# Patient Record
Sex: Female | Born: 1935 | Race: White | Hispanic: No | Smoking: Former smoker
Health system: Southern US, Community
[De-identification: ages and names within clinical notes are randomized; demographics above are authoritative.]

## PROBLEM LIST (undated history)

## (undated) DIAGNOSIS — I10 Essential (primary) hypertension: Secondary | ICD-10-CM

## (undated) DIAGNOSIS — G894 Chronic pain syndrome: Secondary | ICD-10-CM

## (undated) DIAGNOSIS — I639 Cerebral infarction, unspecified: Secondary | ICD-10-CM

## (undated) DIAGNOSIS — H9190 Unspecified hearing loss, unspecified ear: Secondary | ICD-10-CM

## (undated) DIAGNOSIS — M549 Dorsalgia, unspecified: Secondary | ICD-10-CM

## (undated) DIAGNOSIS — J189 Pneumonia, unspecified organism: Secondary | ICD-10-CM

## (undated) DIAGNOSIS — M6282 Rhabdomyolysis: Secondary | ICD-10-CM

## (undated) DIAGNOSIS — R296 Repeated falls: Secondary | ICD-10-CM

## (undated) DIAGNOSIS — H04123 Dry eye syndrome of bilateral lacrimal glands: Secondary | ICD-10-CM

## (undated) DIAGNOSIS — I2699 Other pulmonary embolism without acute cor pulmonale: Secondary | ICD-10-CM

## (undated) DIAGNOSIS — G8929 Other chronic pain: Secondary | ICD-10-CM

## (undated) HISTORY — DX: Dorsalgia, unspecified: M54.9

## (undated) HISTORY — PX: APPENDECTOMY: SHX54

## (undated) HISTORY — DX: Essential (primary) hypertension: I10

## (undated) HISTORY — DX: Pneumonia, unspecified organism: J18.9

## (undated) HISTORY — DX: Other chronic pain: G89.29

## (undated) HISTORY — PX: ABDOMINAL HYSTERECTOMY: SHX81

## (undated) HISTORY — PX: CATARACT EXTRACTION: SUR2

## (undated) HISTORY — PX: BREAST ENHANCEMENT SURGERY: SHX7

## (undated) HISTORY — DX: Chronic pain syndrome: G89.4

## (undated) HISTORY — PX: TONSILLECTOMY: SUR1361

## (undated) NOTE — *Deleted (*Deleted)
[place] MEDICAID [id number] LEVEL OF CARE SCREENING TOOL     IDENTIFICATION  Patient Name: Angelica Bell Birthdate: 06-Jan-1932 Sex: female Admission Date (Current Location): 10/10/2020  [facility] and [place] Number:  [employer] and Address:  [facility],  [address] [place] [address]      Provider Number: [id number]  Attending Physician Name and Address:  Vassie Loll, MD  Relative Name and Phone Number:       Current Level of Care: Hospital Recommended Level of Care: Skilled Nursing Facility Prior Approval Number:    Date Approved/Denied:   PASRR Number:    Discharge Plan: SNF    Current Diagnoses: Patient Active Problem List   Diagnosis Date Noted  . AMS (altered mental status) 10/12/2020  . Acute ischemic stroke (HCC) 10/11/2020  . Hyperglycemia 10/11/2020  . Failure to thrive in adult 10/11/2020  . Palliative care by specialist   . Goals of care, counseling/discussion   . DNR (do not resuscitate) discussion   . Acute CVA (cerebrovascular accident) (HCC) 08/31/2020  . Acute metabolic encephalopathy 08/30/2020  . Acute respiratory failure with hypoxia (HCC) 05/17/2020  . Opioid overdose (HCC) 05/13/2020  . DNR (do not resuscitate) 03/09/2020  . Frequent falls 03/09/2020  . Multiple rib fractures 03/08/2020  . Common bile duct dilatation 03/08/2020  . Leukocytosis 03/08/2020  . Normocytic anemia 03/08/2020  . Hypomagnesemia 02/08/2020  . Fall 02/06/2020  . AKI (acute kidney injury) (HCC) 02/05/2020  . UTI (urinary tract infection) 09/01/2019  . Rhabdomyolysis 09/01/2019  . Shock (HCC) 08/31/2019  . Acute cystitis 08/03/2019  . Confusion and disorientation 08/03/2019  . Cerebrovascular accident (CVA) (HCC)   . ARF (acute renal failure) (HCC) 07/26/2019  . Hyponatremia 07/26/2019  . Abnormal liver function 07/26/2019  . Peripheral neuropathy   . Drug overdose   . Polypharmacy   . Altered mental status 10/25/2017  . Closed  displaced intertrochanteric fracture of left femur (HCC) 02/16/2017  . Hip fracture, right (HCC) 12/25/2015  . Chronic pain 12/24/2015  . Fracture of femoral neck, right, closed (HCC) 12/24/2015  . Essential hypertension 12/24/2015  . Community acquired pneumonia 04/15/2013  . Hypokalemia 04/15/2013  . Labial cyst 02/08/2012  . POSTMENOPAUSAL SYNDROME 11/28/2009  . Mononeuritis of lower limb 02/22/2009  . INSOMNIA-SLEEP DISORDER-UNSPEC 10/12/2008  . Lumbago 08/02/2008  . 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 ANXIETY DEPRESSION 12/16/2007  . Hyperlipidemia 07/08/2007  . Osteoarthritis 07/08/2007  . Osteoporosis 07/08/2007  . SCOLIOSIS NEC 07/08/2007    Orientation RESPIRATION BLADDER Height & Weight     Self  Normal Incontinent Weight: 44 kg Height:  5\' 6"  (167.6 cm)  BEHAVIORAL SYMPTOMS/MOOD NEUROLOGICAL BOWEL NUTRITION STATUS      Incontinent Diet (see dc summary)  AMBULATORY STATUS COMMUNICATION OF NEEDS Skin   Extensive Assist Verbally Normal                       Personal Care Assistance Level of Assistance  Bathing, Feeding, Dressing Bathing Assistance: Maximum assistance Feeding assistance: Maximum assistance Dressing Assistance: Maximum assistance     Functional Limitations Info  Sight, Hearing, Speech Sight Info: Adequate Hearing Info: Adequate Speech Info: Impaired    SPECIAL CARE FACTORS FREQUENCY                       Contractures Contractures Info: Not present    Additional Factors Info  Code Status, Allergies Code Status Info: Full Allergies Info: Hctz, Amoxicillin-pot  Clavulanate, Lisinopril-hydrocholrothiazide, Sulfa Antibiotics, Sulfonamide Derivatives           Current Medications (10/13/2020):  This is the current hospital active medication list Current Facility-Administered Medications  Medication Dose Route Frequency Provider Last Rate Last Admin  . 0.9 %  sodium chloride infusion   Intravenous Continuous  Vassie Loll, MD 50 mL/hr at 10/13/20 1605 New Bag at 10/13/20 1605  . acetaminophen (TYLENOL) tablet 500 mg  500 mg Oral Q6H PRN Vassie Loll, MD      . amLODipine (NORVASC) tablet 2.5 mg  2.5 mg Oral Daily Vassie Loll, MD   2.5 mg at 10/13/20 1032  . aspirin EC tablet 81 mg  81 mg Oral Daily Vassie Loll, MD   81 mg at 10/13/20 1033  . atorvastatin (LIPITOR) tablet 40 mg  40 mg Oral Daily Adefeso, Oladapo, DO   40 mg at 10/13/20 1032  . buPROPion [facility]) tablet 75 mg  75 mg Oral Daily Vassie Loll, MD   75 mg at 10/13/20 1032  . cefTRIAXone (ROCEPHIN) 1 g in sodium chloride 0.9 % 100 mL IVPB  1 g Intravenous Q24H Adefeso, Oladapo, DO 200 mL/hr at 10/12/20 2206 1 g at 10/12/20 2206  . cholecalciferol (VITAMIN D3) tablet 1,000 Units  1,000 Units Oral Daily Vassie Loll, MD   1,000 Units at 10/13/20 1032  . clopidogrel (PLAVIX) tablet 75 mg  75 mg Oral Daily Vassie Loll, MD   75 mg at 10/13/20 1033  . donepezil (ARICEPT) tablet 5 mg  5 mg Oral Daily Vassie Loll, MD   5 mg at 10/12/20 2207  . feeding supplement (ENSURE ENLIVE / ENSURE PLUS) liquid 237 mL  1 Bottle Oral TID Vassie Loll, MD   237 mL at 10/13/20 1607  . mirtazapine (REMERON) tablet 7.5 mg  7.5 mg Oral QHS Vassie Loll, MD   7.5 mg at 10/12/20 2207  . multivitamin with minerals tablet 1 tablet  1 tablet Oral Daily Vassie Loll, MD   1 tablet at 10/13/20 1032     Discharge Medications: Please see discharge summary for a list of discharge medications.  Relevant Imaging Results:  Relevant Lab Results:   Additional Information    Fransisca Shawn, Chrystine Oiler, RN

---

## 1999-01-01 ENCOUNTER — Observation Stay (HOSPITAL_COMMUNITY): Admission: RE | Admit: 1999-01-01 | Discharge: 1999-01-02 | Payer: Self-pay | Admitting: Urology

## 1999-01-07 ENCOUNTER — Emergency Department (HOSPITAL_COMMUNITY): Admission: EM | Admit: 1999-01-07 | Discharge: 1999-01-07 | Payer: Self-pay | Admitting: Emergency Medicine

## 2000-01-23 ENCOUNTER — Encounter: Payer: Self-pay | Admitting: Rheumatology

## 2000-01-23 ENCOUNTER — Encounter: Admission: RE | Admit: 2000-01-23 | Discharge: 2000-01-23 | Payer: Self-pay | Admitting: Rheumatology

## 2000-02-12 ENCOUNTER — Encounter: Payer: Self-pay | Admitting: Rheumatology

## 2000-02-12 ENCOUNTER — Encounter: Admission: RE | Admit: 2000-02-12 | Discharge: 2000-02-12 | Payer: Self-pay | Admitting: Rheumatology

## 2000-02-13 ENCOUNTER — Encounter: Admission: RE | Admit: 2000-02-13 | Discharge: 2000-02-13 | Payer: Self-pay | Admitting: Rheumatology

## 2000-02-13 ENCOUNTER — Encounter: Payer: Self-pay | Admitting: Rheumatology

## 2000-05-20 ENCOUNTER — Encounter: Admission: RE | Admit: 2000-05-20 | Discharge: 2000-05-20 | Payer: Self-pay | Admitting: Internal Medicine

## 2000-05-20 ENCOUNTER — Encounter: Payer: Self-pay | Admitting: Internal Medicine

## 2003-09-12 ENCOUNTER — Emergency Department (HOSPITAL_COMMUNITY): Admission: EM | Admit: 2003-09-12 | Discharge: 2003-09-13 | Payer: Self-pay | Admitting: Emergency Medicine

## 2003-09-13 ENCOUNTER — Encounter: Payer: Self-pay | Admitting: Emergency Medicine

## 2003-12-26 ENCOUNTER — Encounter: Admission: RE | Admit: 2003-12-26 | Discharge: 2003-12-26 | Payer: Self-pay | Admitting: Family Medicine

## 2004-04-22 ENCOUNTER — Emergency Department (HOSPITAL_COMMUNITY): Admission: EM | Admit: 2004-04-22 | Discharge: 2004-04-22 | Payer: Self-pay | Admitting: Emergency Medicine

## 2004-05-11 IMAGING — CT CT HEAD W/O CM
1 series · 16 of 30 positions shown, 20 images · non-contrast
Comparison: NONE.

FINDINGS
CLINICAL DATA: NECK PAIN, HEADACHE, WEAKNESS.
CRANIAL CT - WITHOUT CONTRAST - 09/12/2003
TECHNIQUE: 5 MM AXIAL IMAGES WERE OBTAINED FROM THE SKULL BASE THROUGH THE BRAIN TO THE VERTEX.

[Series 2: trauma head · axial · 0.47mm/px · z∈[+112,+232]mm · 16 of 44 slices shown, 20 images]
[im 2/44  brain]
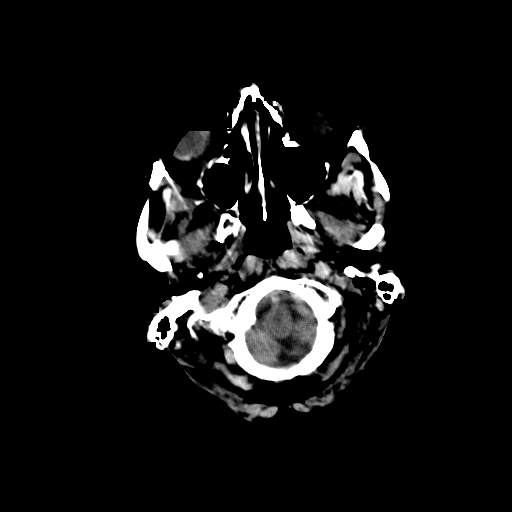
[im 2/44  bone]
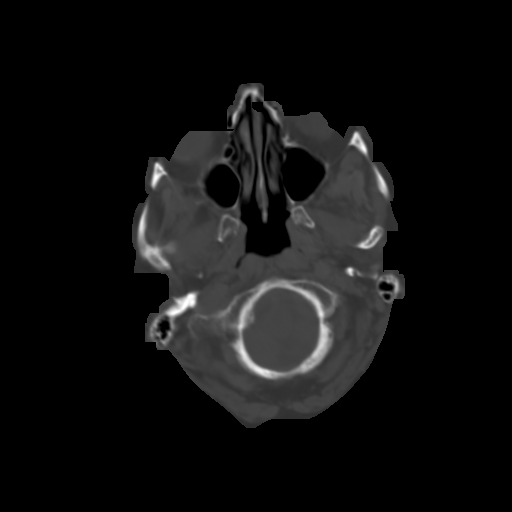
[im 5/44  brain]
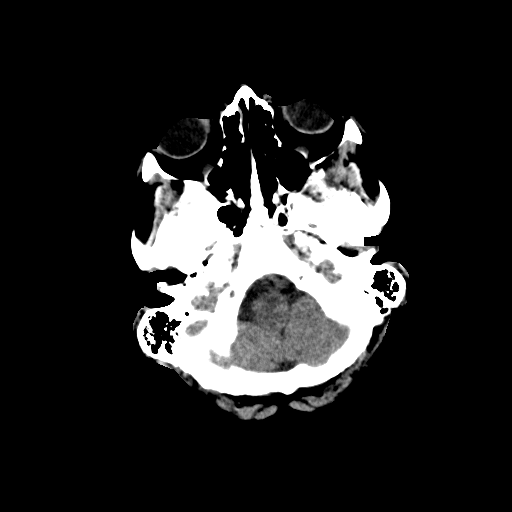
[im 8/44  brain]
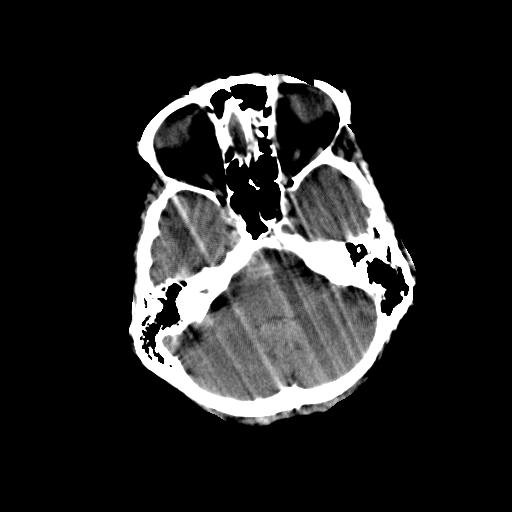
[im 11/44  brain]
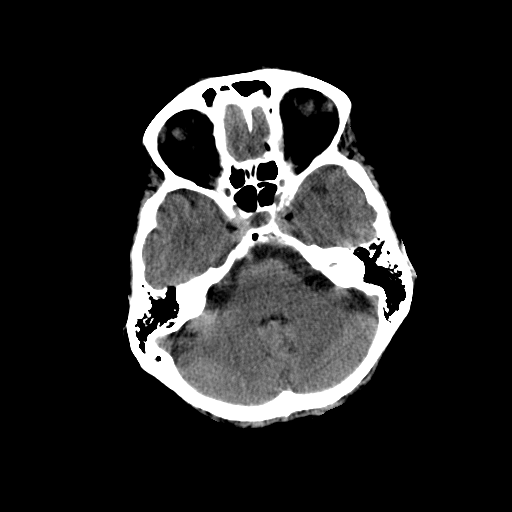
[im 12/44  brain]
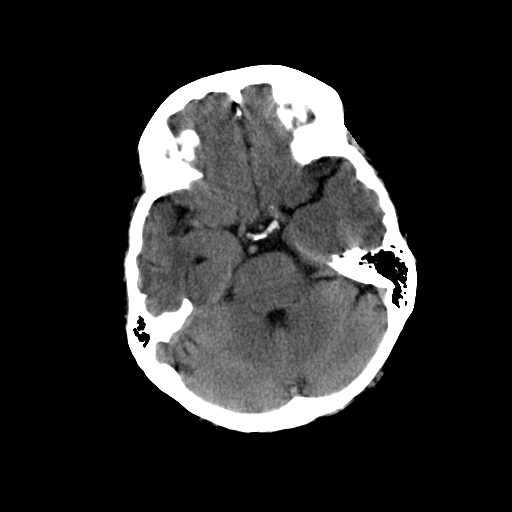
[im 12/44  bone]
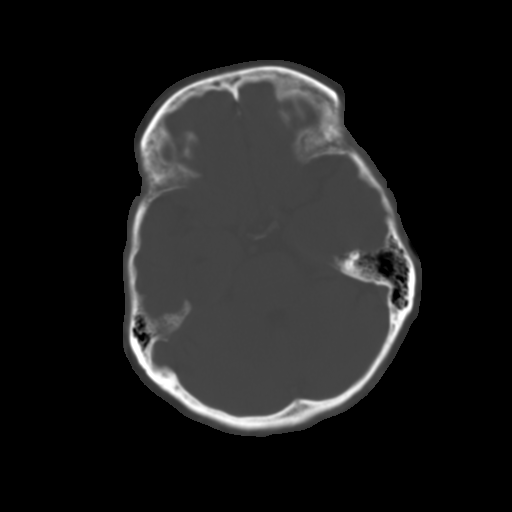
[im 15/44  brain]
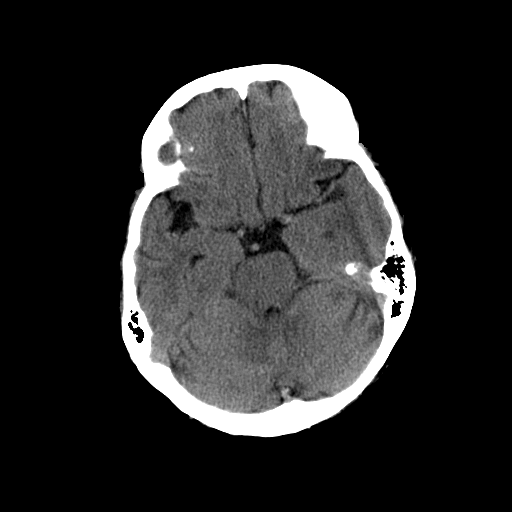
[im 18/44  brain]
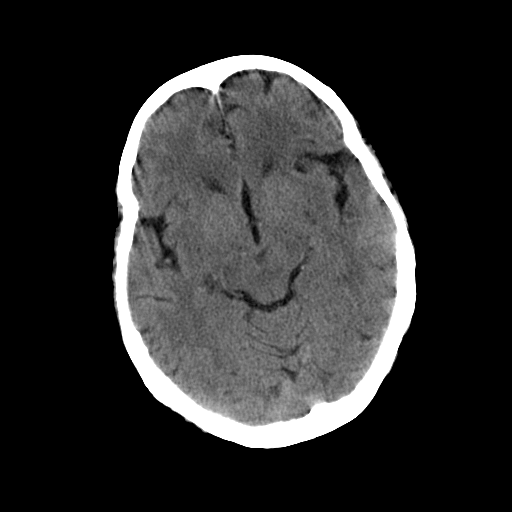
[im 21/44  brain]
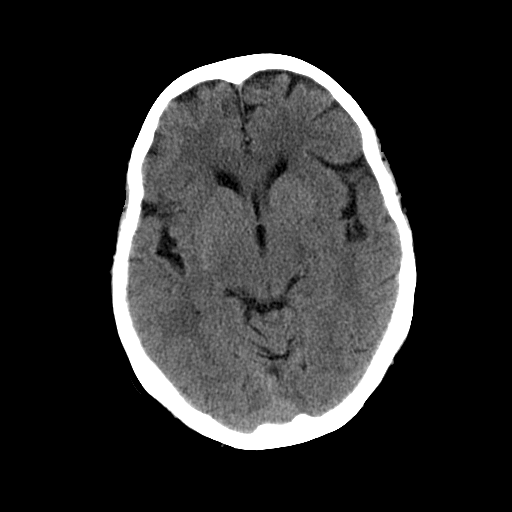
[im 23/44  brain]
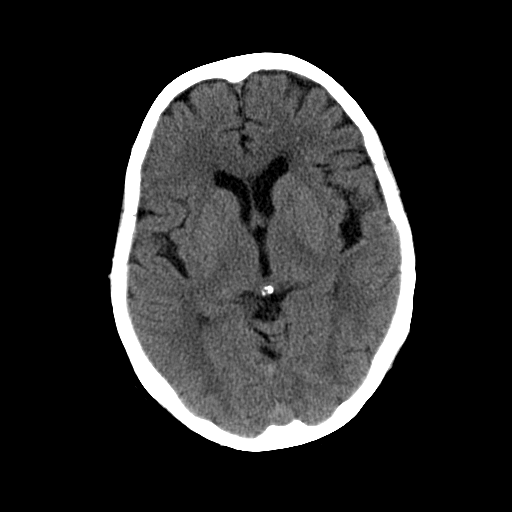
[im 23/44  bone]
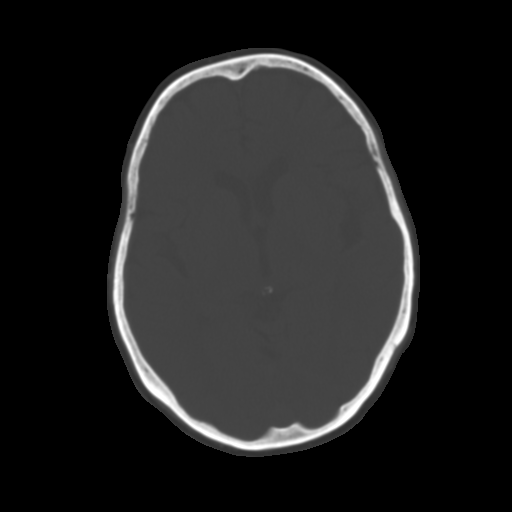
[im 26/44  brain]
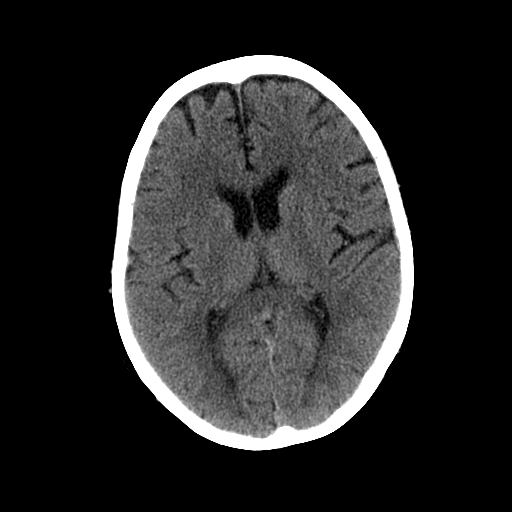
[im 29/44  brain]
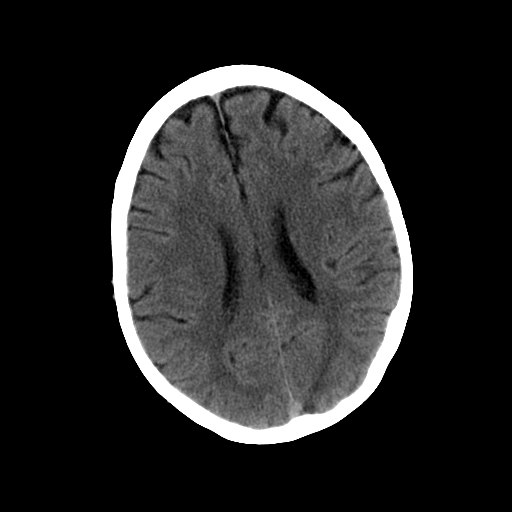
[im 32/44  brain]
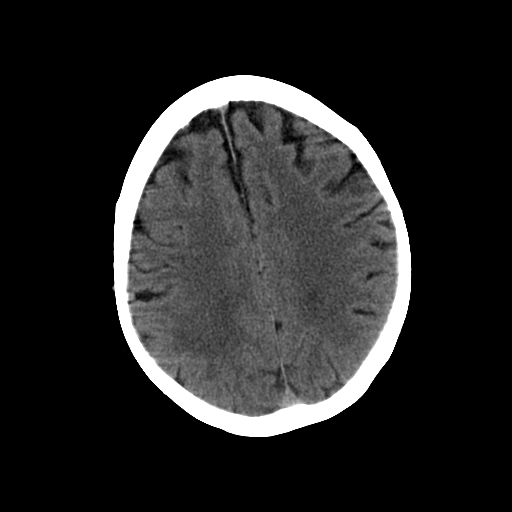
[im 33/44  brain]
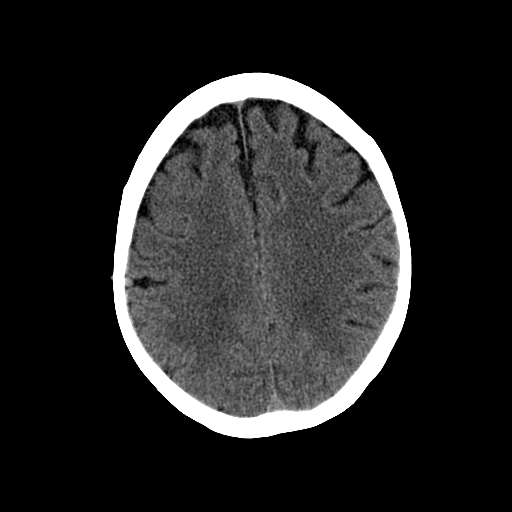
[im 33/44  bone]
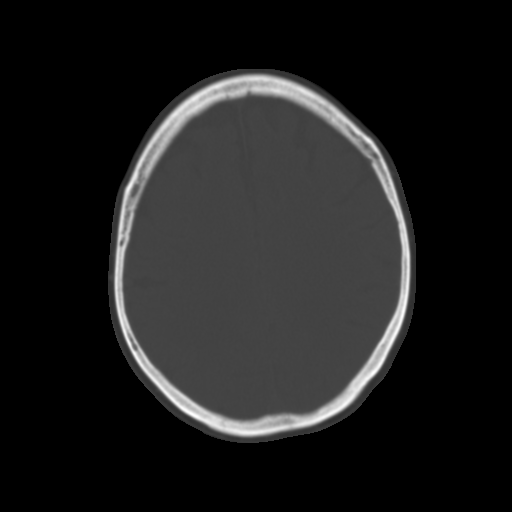
[im 36/44  brain]
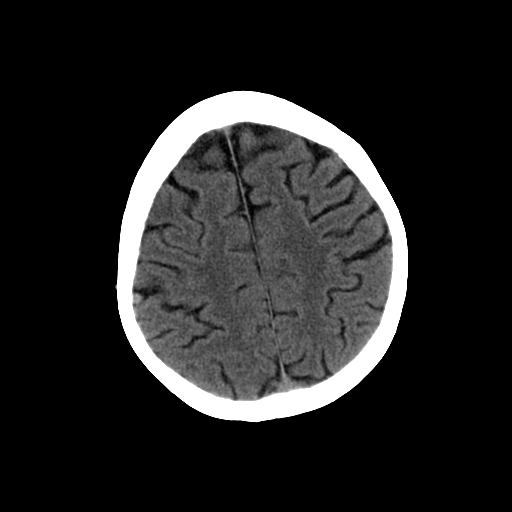
[im 39/44  brain]
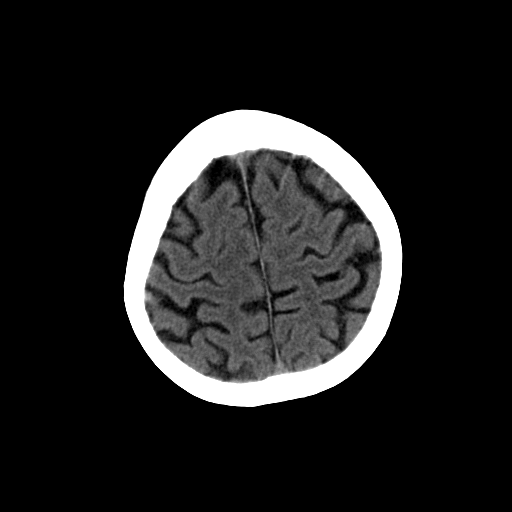
[im 42/44  brain]
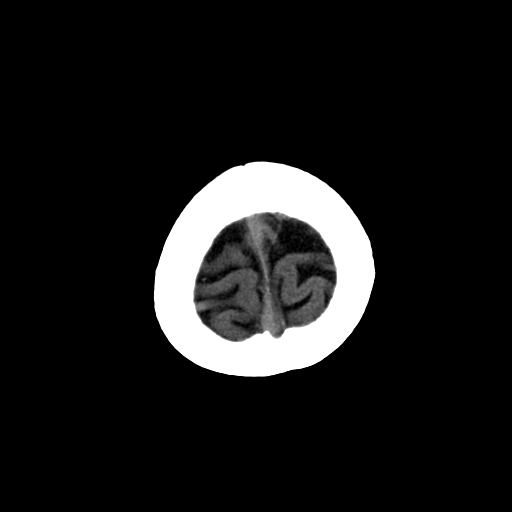

[16 of 30 positions shown; findings below may reference images not displayed]

FINDINGS: PATIENT MOTION DEGRADED MANY OF THE IMAGES, THOUGH THESE WERE REPEATED AND A DIAGNOSTIC STUDY WAS
OBTAINED.  THE VENTRICULAR SYSTEM IS NORMAL IN SIZE AND APPEARANCE FOR AGE.  THERE IS NO MASS
EFFECT OR MIDLINE SHIFT.  THERE IS NO HEMORRHAGE OR HEMATOMA.  NO EXTRAAXIAL FLUID COLLECTIONS ARE
IDENTIFIED.  MILD CORTICAL ATROPHY IS PRESENT CONSISTENT WITH AGE.  I SEE NO EVIDENCE FOR AN ACUTE
STROKE OR OTHER FOCAL BRAIN PARENCHYMAL ABNORMALITIES.
THE BONE WINDOW IMAGES DEMONSTRATE NO FOCAL OSSEOUS ABNORMALITIES INVOLVING THE SKULL.  THE
VISUALIZED PARANASAL SINUSES AND THE MASTOID AIR CELLS APPEAR WELL AERATED.
IMPRESSION
NO ACUTE INTRACRANIAL ABNORMALITY.

## 2004-08-21 ENCOUNTER — Other Ambulatory Visit: Admission: RE | Admit: 2004-08-21 | Discharge: 2004-08-21 | Payer: Self-pay | Admitting: Family Medicine

## 2004-08-23 ENCOUNTER — Encounter: Payer: Self-pay | Admitting: Family Medicine

## 2004-08-23 LAB — CONVERTED CEMR LAB

## 2004-08-24 IMAGING — US US CAROTID DUPLEX BILAT
1 series · 14 of 24 positions shown · non-contrast
Comparison: none

<!--  IDXRADR:ADDEND:BEGIN -->Addendum Begins<!--  IDXRADR:ADDEND:INNER_BEGIN -->ADDENDUM:
 The clinical data should read hypertension, remote history of tobacco use stopped in 1888.  Left arm and hand weakness. 

 <!--  IDXRADR:ADDEND:INNER_END -->Addendum Ends
<!--  IDXRADR:ADDEND:END -->Clinical Data:  Hypertension, tobacco use.  Left arm and hand weakness.
 BILATERAL CAROTID DUPLEX ULTRASOUND

[Series 1: unknown · 0.07mm/px · 14 of 43 slices shown]
[im 1/43]
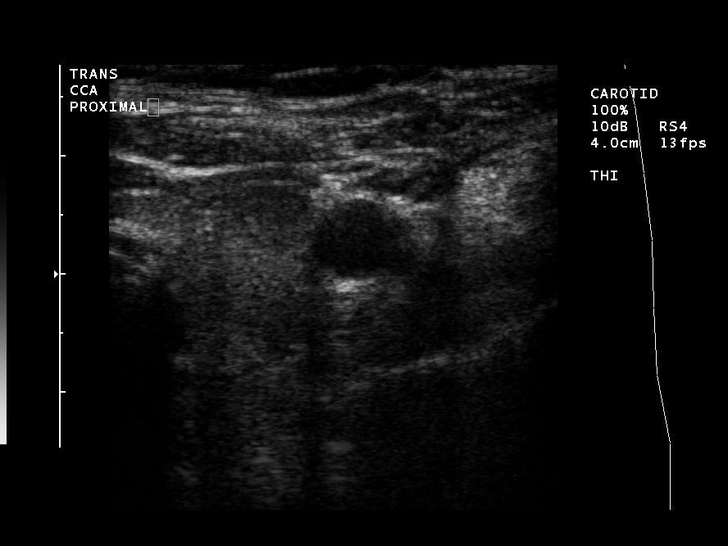
[im 4/43]
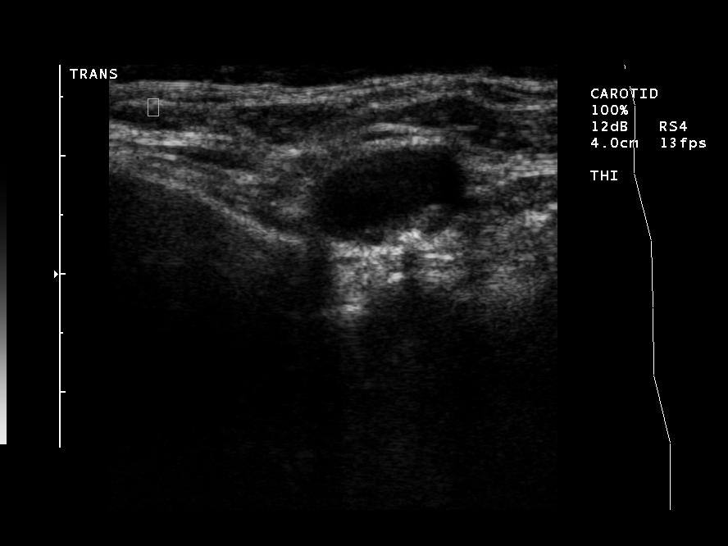
[im 8/43]
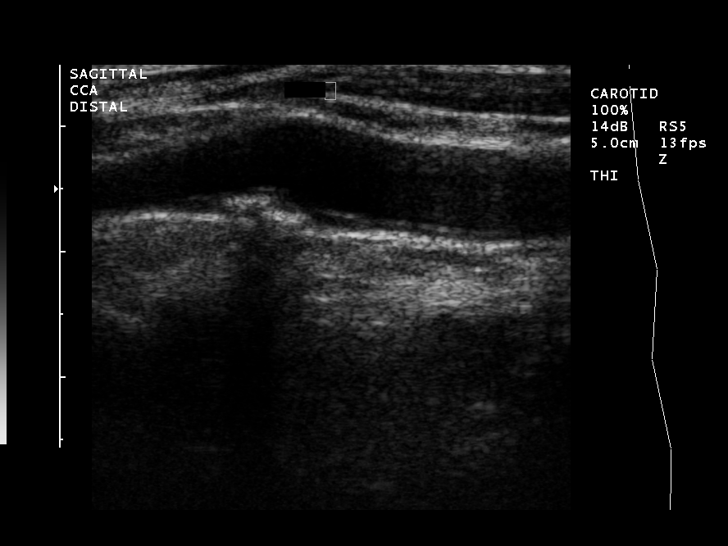
[im 11/43]
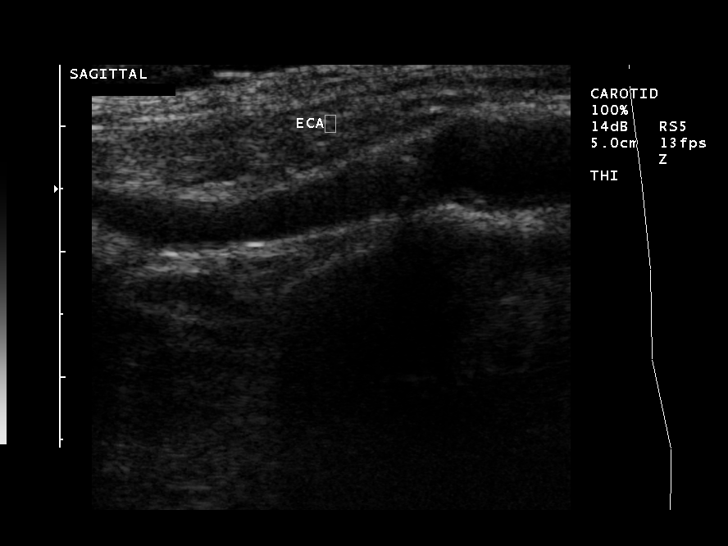
[im 13/43]
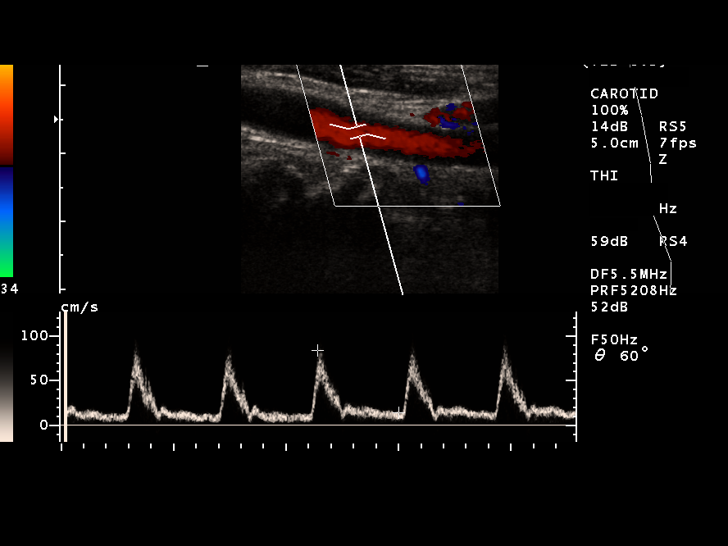
[im 17/43]
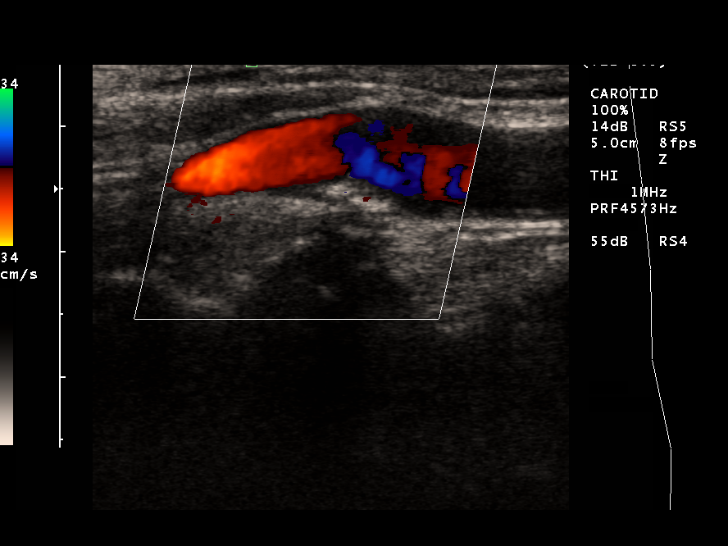
[im 21/43]
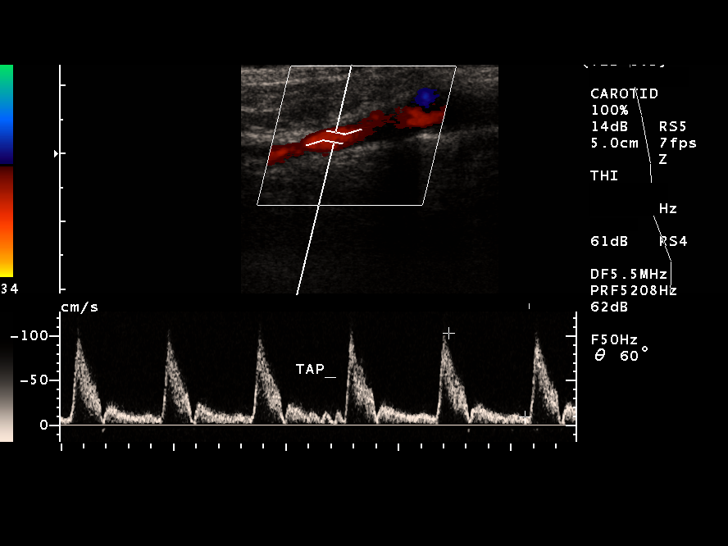
[im 22/43]
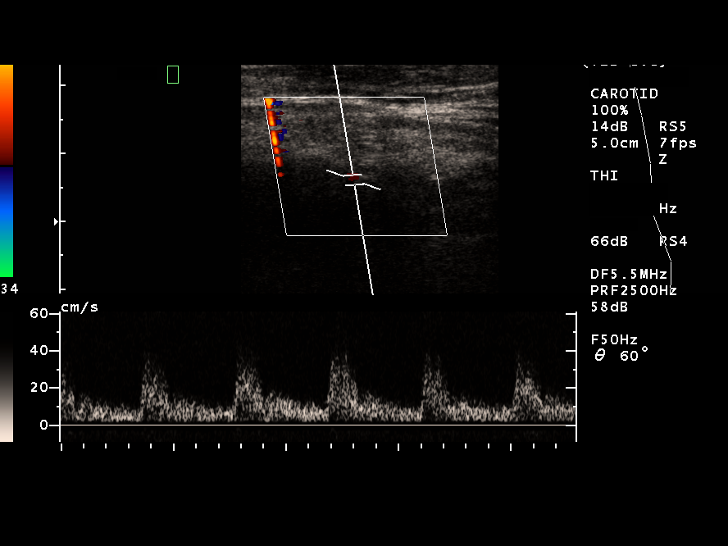
[im 26/43]
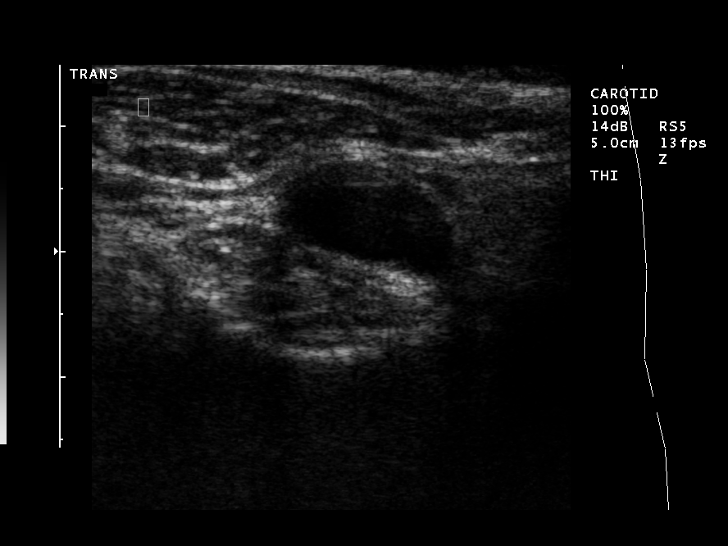
[im 30/43]
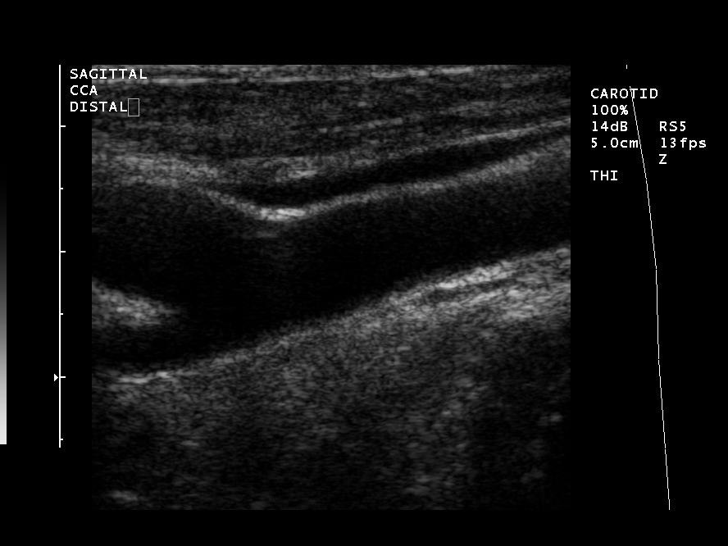
[im 33/43]
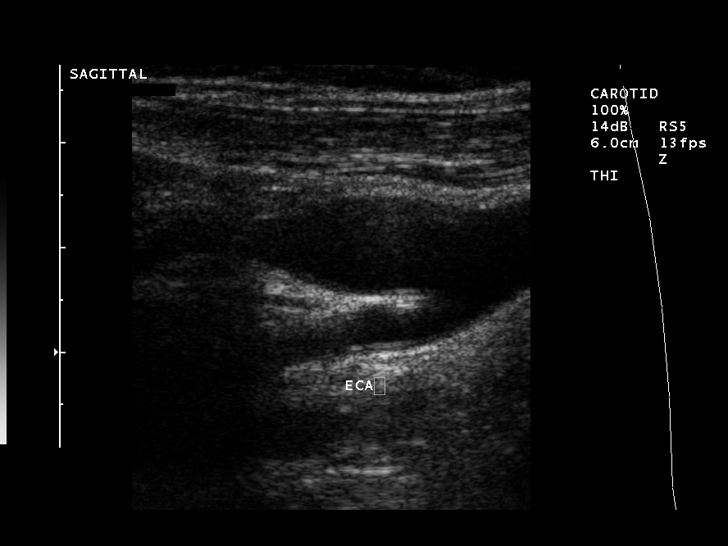
[im 35/43]
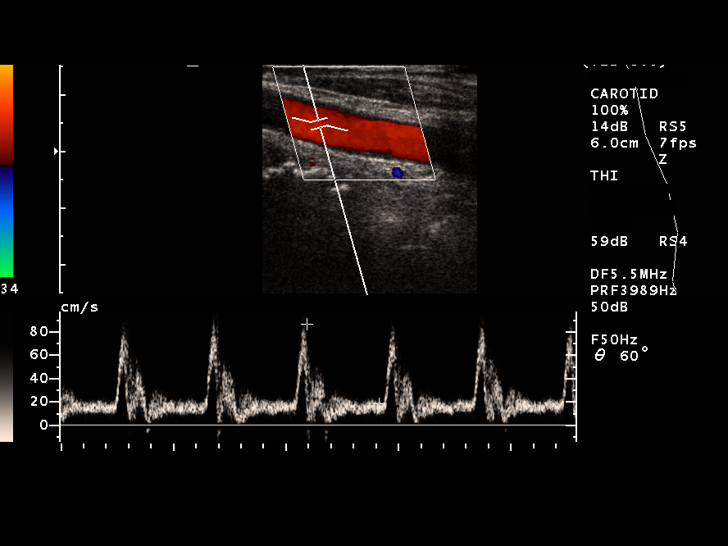
[im 39/43]
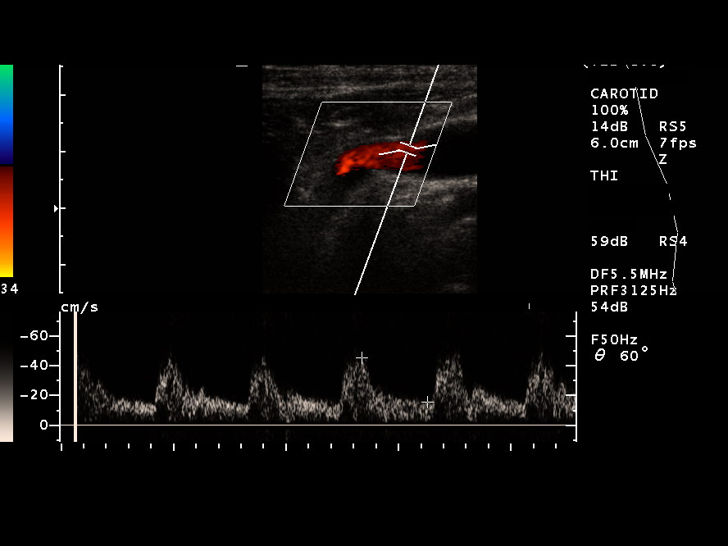
[im 43/43]
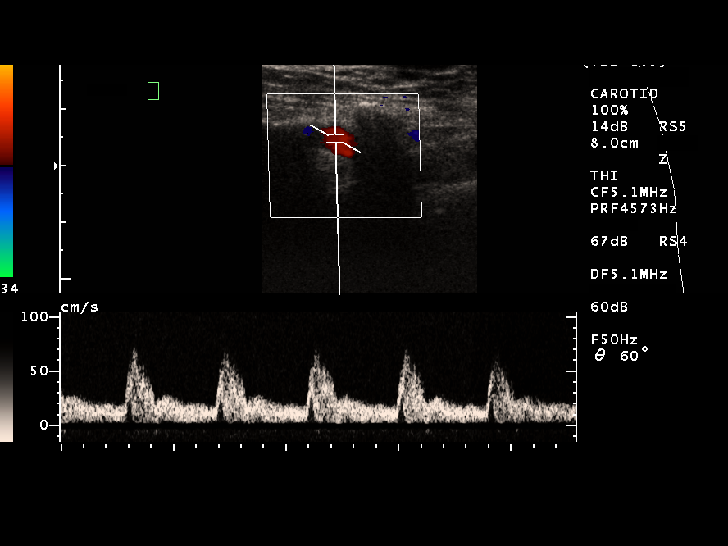

[14 of 24 positions shown; findings below may reference images not displayed]

FINDINGS: There is a partially calcified plaque in the right carotid bifurcation extending into the proximal internal and external carotid arteries.  There is some soft plaque in the left carotid bulb extending into the proximal left ICA.  Normal waveforms throughout the extracranial carotid circulation bilaterally.  Antegrade flow is documented in both vertebral arteries.  
 Velocities are as follows (cm per second):
 SITE  PEAK SYSTOLIC  END-DIASTOLIC
 RIGHT ICA
 RIGHT CCA
 RIGHT ICA/CCA RATIO
 RIGHT ECA
 LEFT ICA
 LEFT CCA
 LEFT ICA/ CCA RATIO  .84  .77
 LEFT ECA
 IMPRESSION
 Bilateral carotid bifurcation plaque without ultrasound evidence of hemodynamically significant stenosis.  
 The exam does not exclude plaque ulceration or embolization.

## 2004-10-02 ENCOUNTER — Ambulatory Visit (HOSPITAL_COMMUNITY): Admission: RE | Admit: 2004-10-02 | Discharge: 2004-10-02 | Payer: Self-pay | Admitting: Internal Medicine

## 2004-10-02 LAB — HM COLONOSCOPY

## 2004-11-20 ENCOUNTER — Ambulatory Visit: Payer: Self-pay | Admitting: Family Medicine

## 2004-12-25 ENCOUNTER — Ambulatory Visit: Payer: Self-pay | Admitting: Family Medicine

## 2004-12-30 LAB — HM MAMMOGRAPHY

## 2005-01-02 ENCOUNTER — Ambulatory Visit: Payer: Self-pay | Admitting: Family Medicine

## 2005-03-28 ENCOUNTER — Ambulatory Visit: Payer: Self-pay | Admitting: Family Medicine

## 2005-05-30 ENCOUNTER — Ambulatory Visit: Payer: Self-pay | Admitting: Family Medicine

## 2005-06-18 ENCOUNTER — Emergency Department (HOSPITAL_COMMUNITY): Admission: EM | Admit: 2005-06-18 | Discharge: 2005-06-18 | Payer: Self-pay | Admitting: Emergency Medicine

## 2005-06-19 ENCOUNTER — Ambulatory Visit: Payer: Self-pay | Admitting: Family Medicine

## 2005-07-03 ENCOUNTER — Ambulatory Visit: Payer: Self-pay | Admitting: Family Medicine

## 2005-10-17 ENCOUNTER — Ambulatory Visit: Payer: Self-pay | Admitting: Family Medicine

## 2005-10-30 ENCOUNTER — Encounter: Admission: RE | Admit: 2005-10-30 | Discharge: 2005-10-30 | Payer: Self-pay | Admitting: Family Medicine

## 2005-11-28 ENCOUNTER — Ambulatory Visit: Payer: Self-pay | Admitting: Family Medicine

## 2005-12-05 ENCOUNTER — Ambulatory Visit: Payer: Self-pay | Admitting: Family Medicine

## 2005-12-11 ENCOUNTER — Ambulatory Visit: Payer: Self-pay | Admitting: Family Medicine

## 2006-01-08 ENCOUNTER — Ambulatory Visit: Payer: Self-pay | Admitting: Internal Medicine

## 2006-01-08 ENCOUNTER — Observation Stay (HOSPITAL_COMMUNITY): Admission: EM | Admit: 2006-01-08 | Discharge: 2006-01-12 | Payer: Self-pay | Admitting: Emergency Medicine

## 2006-01-16 ENCOUNTER — Ambulatory Visit: Payer: Self-pay | Admitting: Family Medicine

## 2006-02-15 IMAGING — CR DG KNEE COMPLETE 4+V*L*
4 series · 4 of 4 positions shown · non-contrast
Comparison: none

HISTORY: Fall, pain, abrasion left knee

LEFT KNEE 4 VIEWS:
Medial compartment joint space narrowing.
Mild bony demineralization.
No fracture, dislocation, or bone destruction.
No joint effusion.
Nonfused ossicle at tibial tubercle.

[view not recorded (1 of 4)]
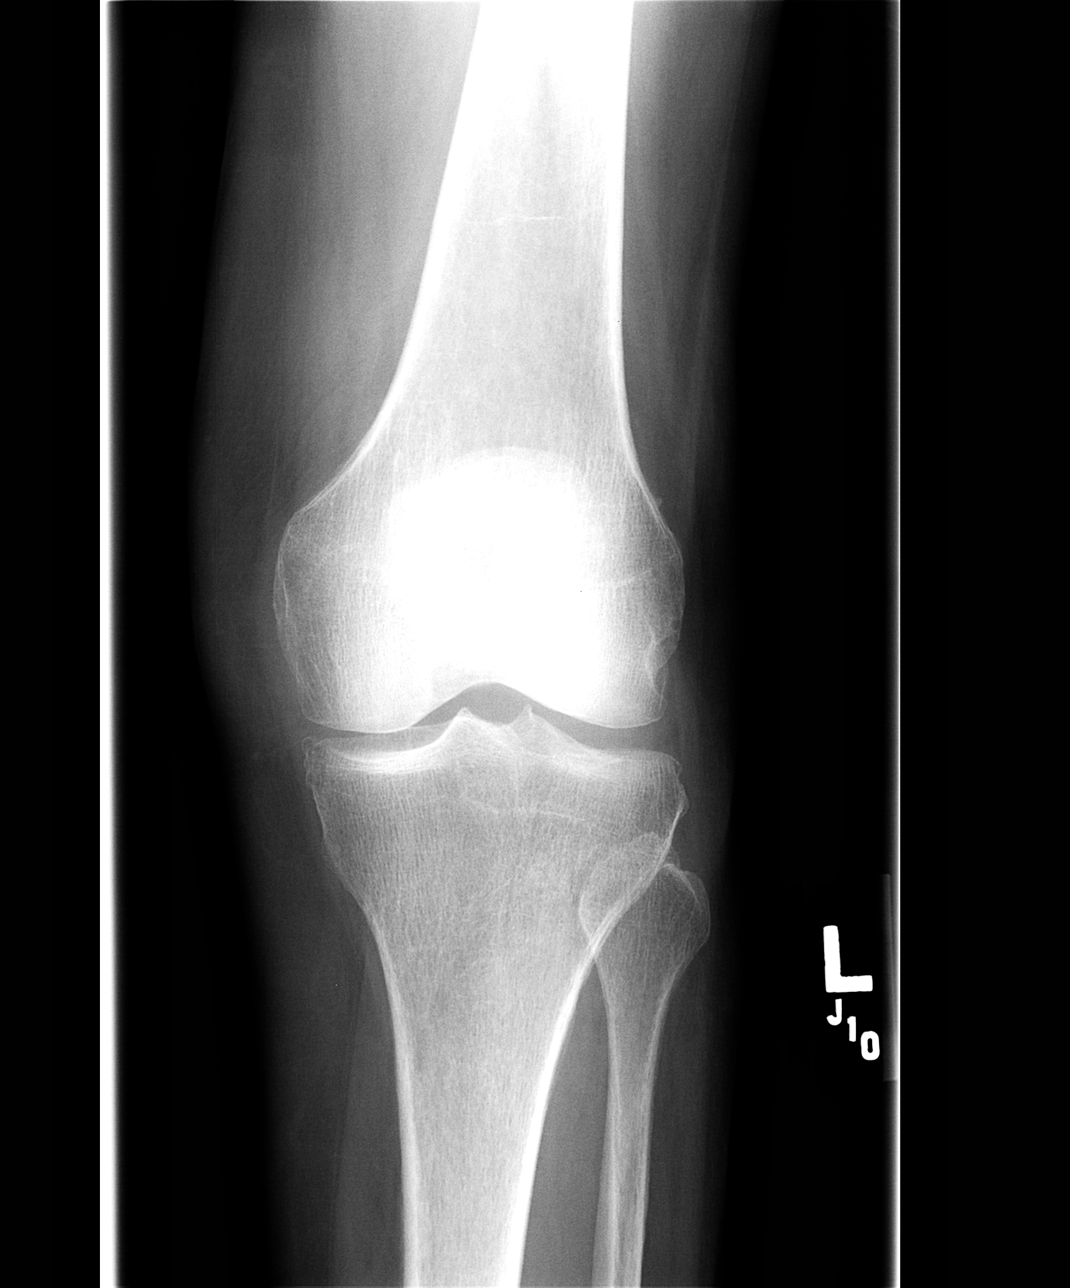

[view not recorded (2 of 4)]
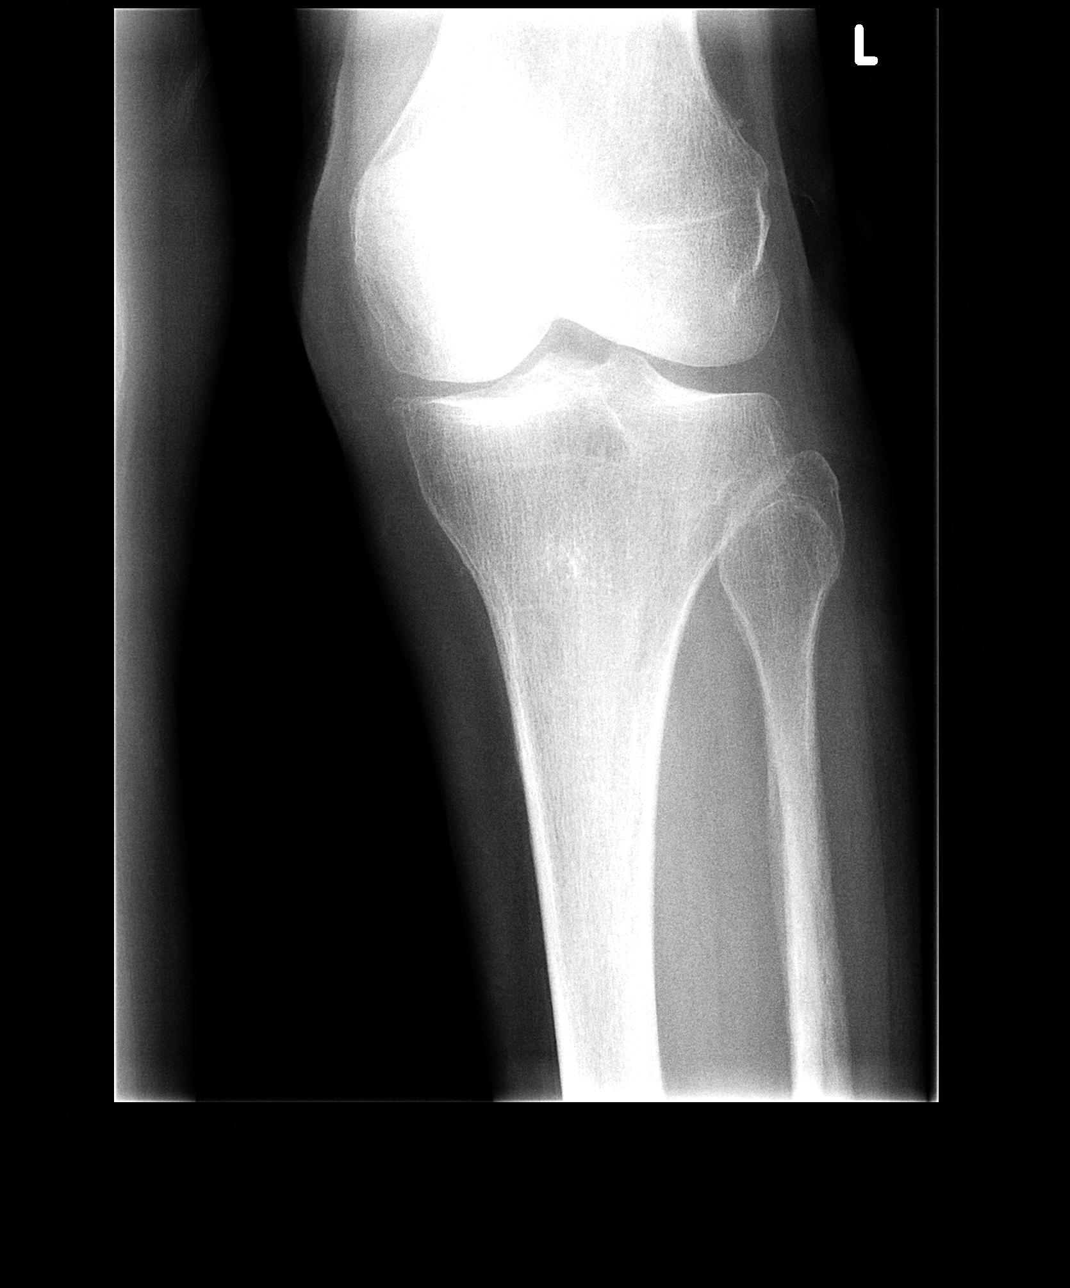

[view not recorded (3 of 4)]
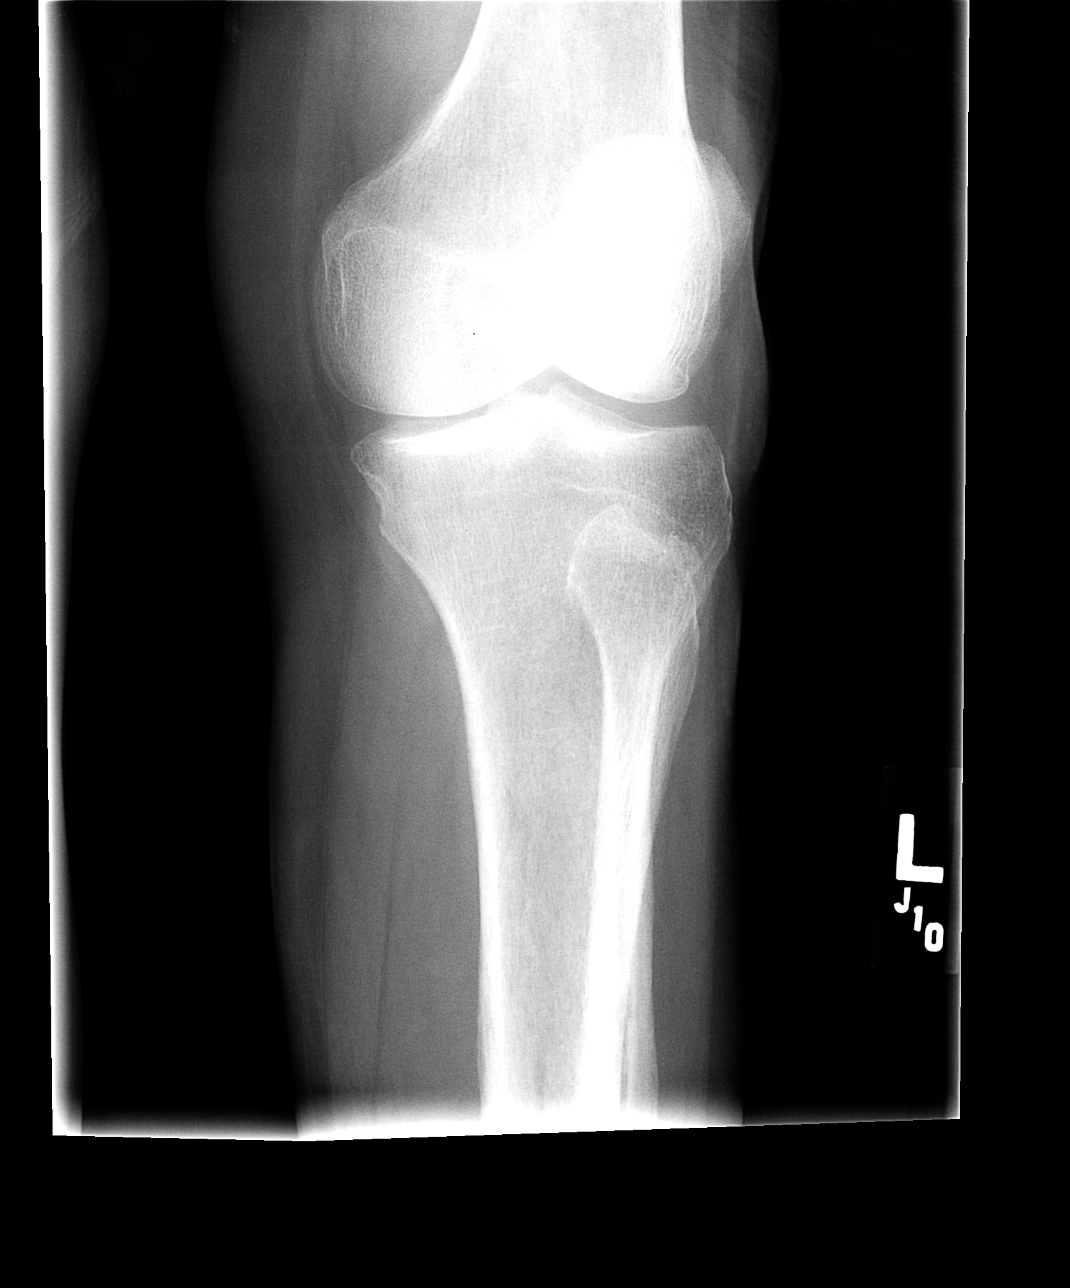

[view not recorded (4 of 4)]
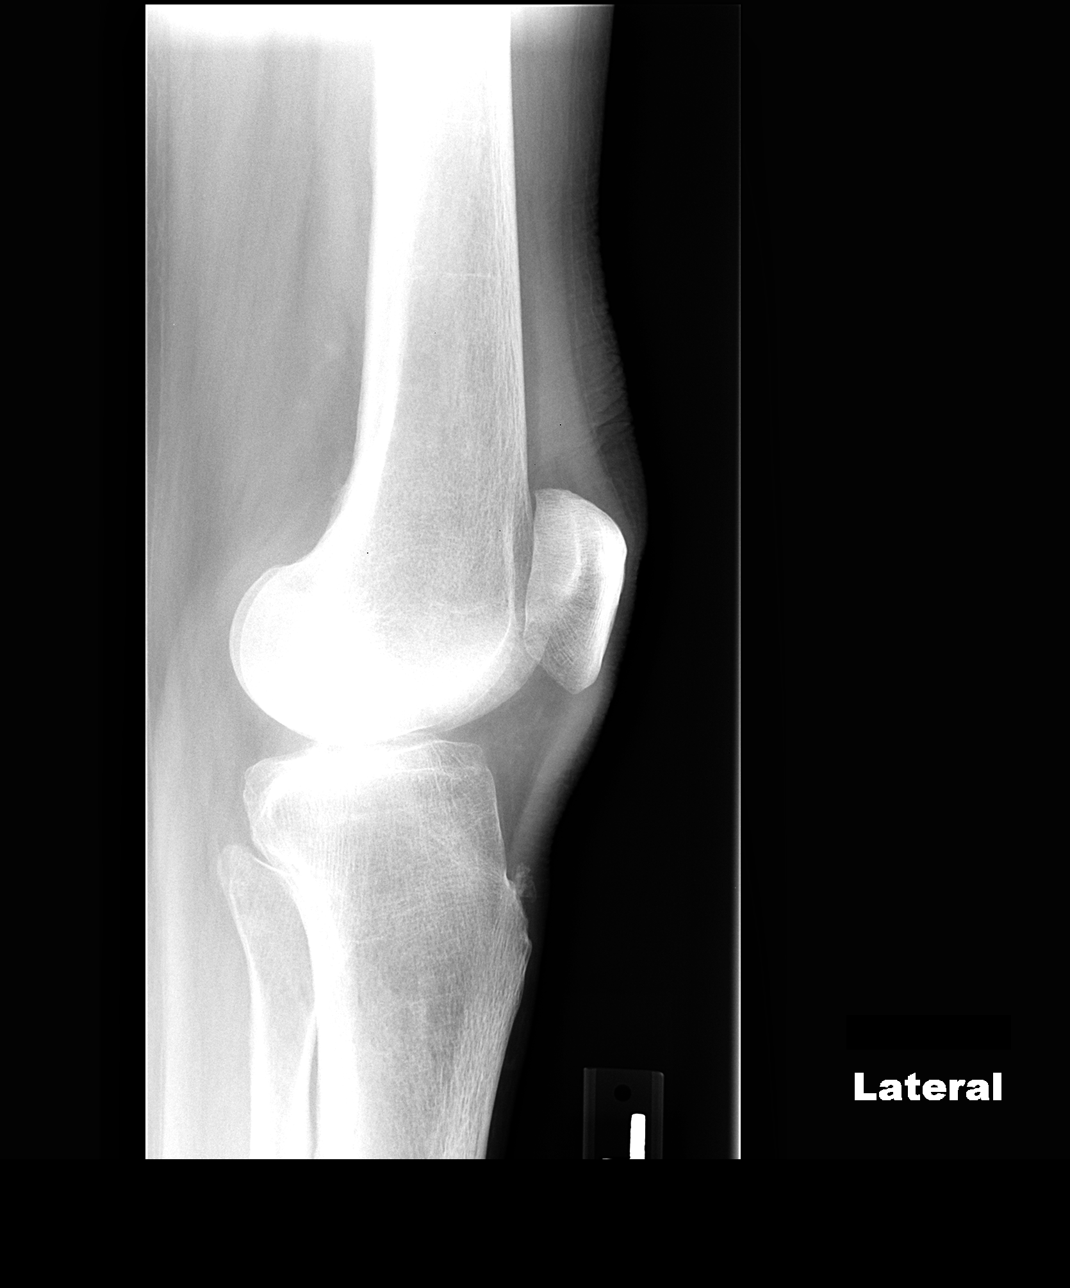

[4 of 4 positions shown; findings below may reference images not displayed]

IMPRESSION: No evidence of acute injury.

## 2006-02-15 IMAGING — CT CT HEAD W/O CM
1 series · 16 of 30 positions shown, 20 images · non-contrast
Comparison: none

HISTORY: Fall striking head, left eye swelling

[Series 2: head_seq 4.5 h42s st · axial · 0.43mm/px · z∈[-172,-46]mm · 16 of 32 slices shown, 20 images]
[im 2/32  brain]
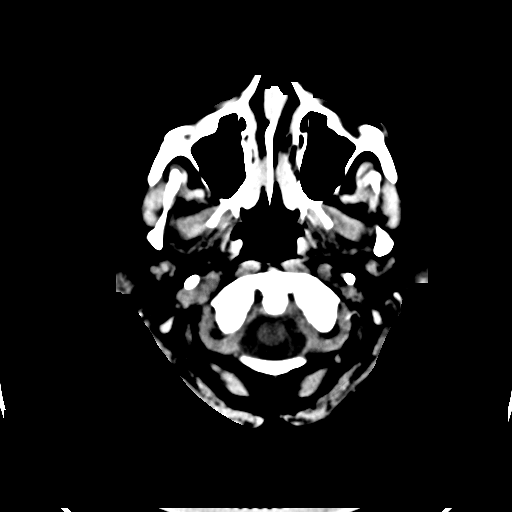
[im 2/32  bone]
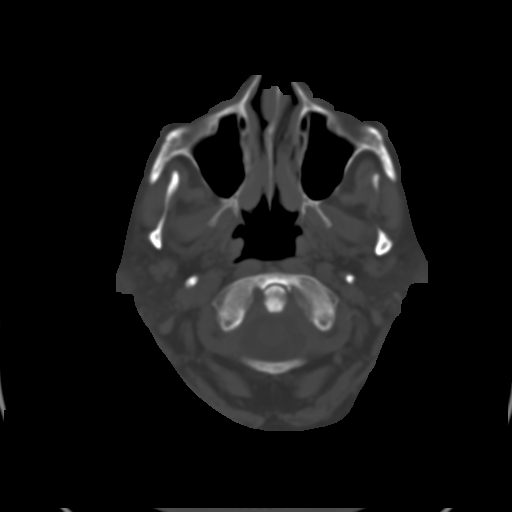
[im 4/32  brain]
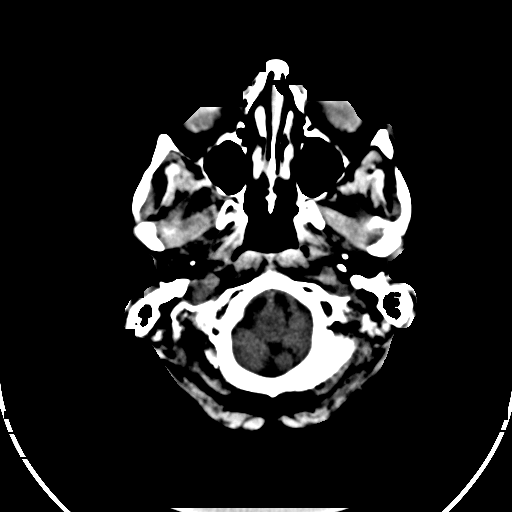
[im 6/32  brain]
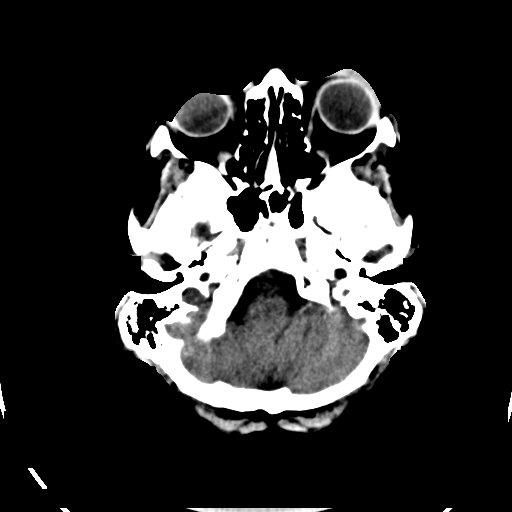
[im 8/32  brain]
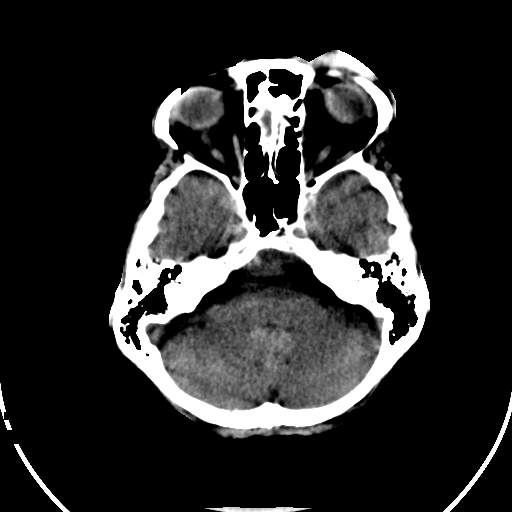
[im 9/32  brain]
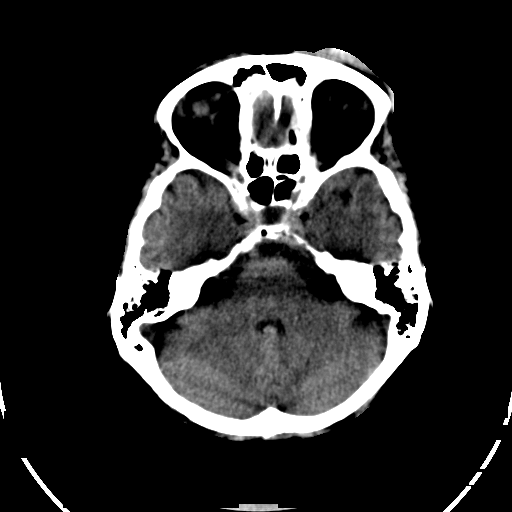
[im 9/32  bone]
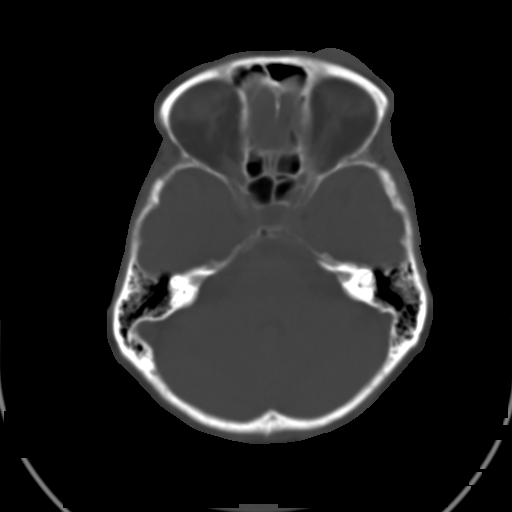
[im 11/32  brain]
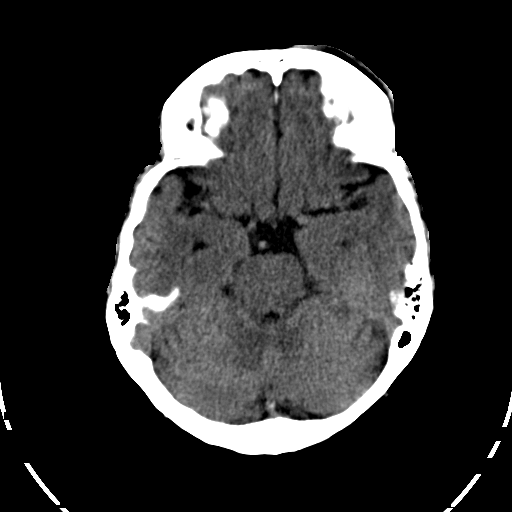
[im 13/32  brain]
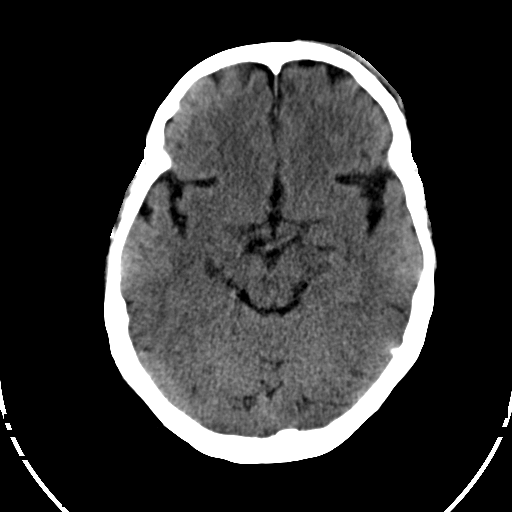
[im 15/32  brain]
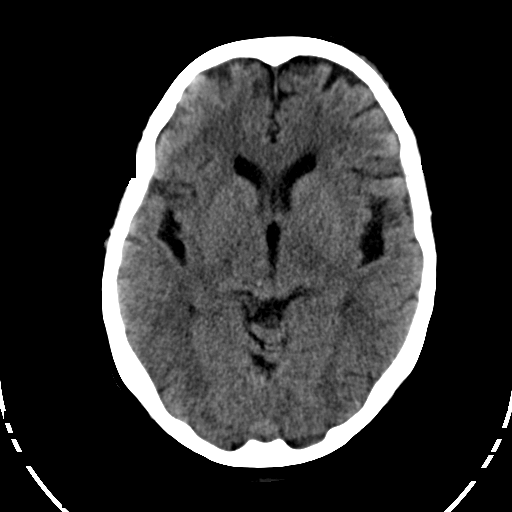
[im 17/32  brain]
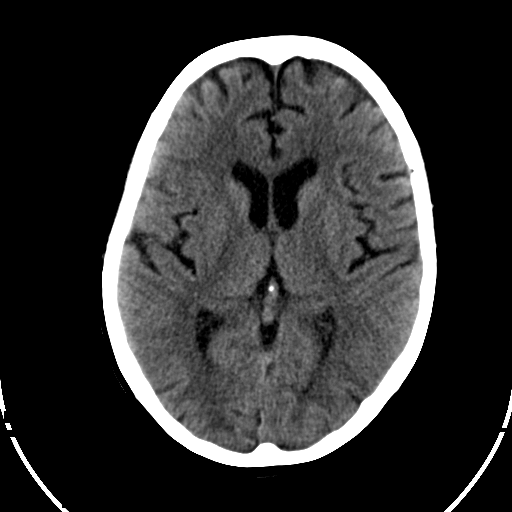
[im 17/32  bone]
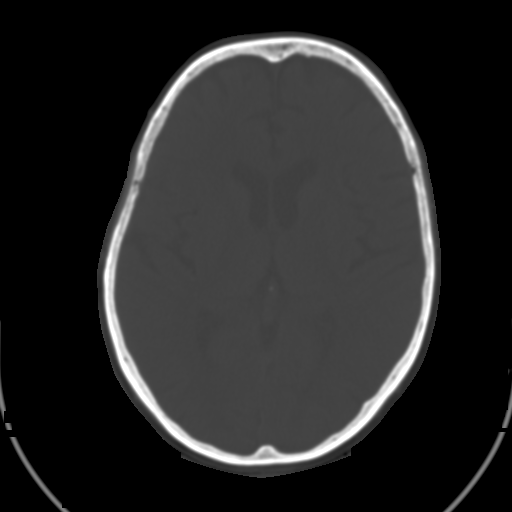
[im 19/32  brain]
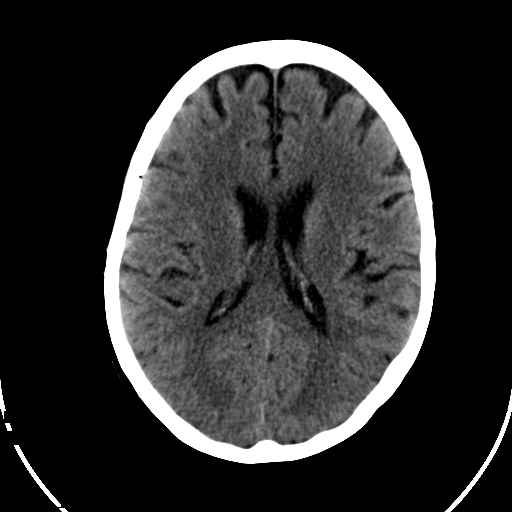
[im 21/32  brain]
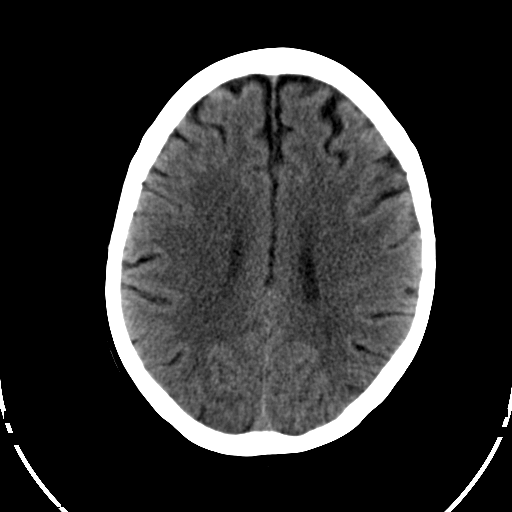
[im 23/32  brain]
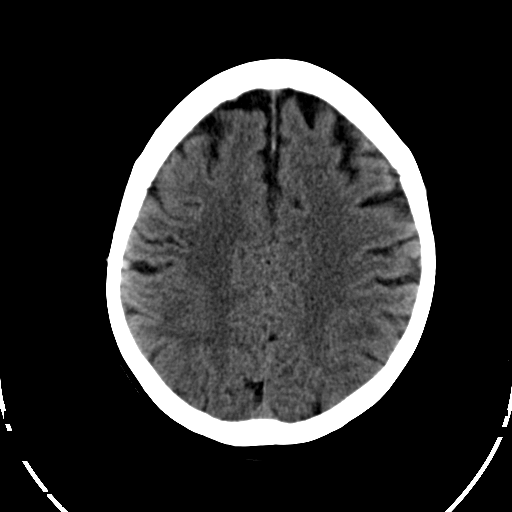
[im 24/32  brain]
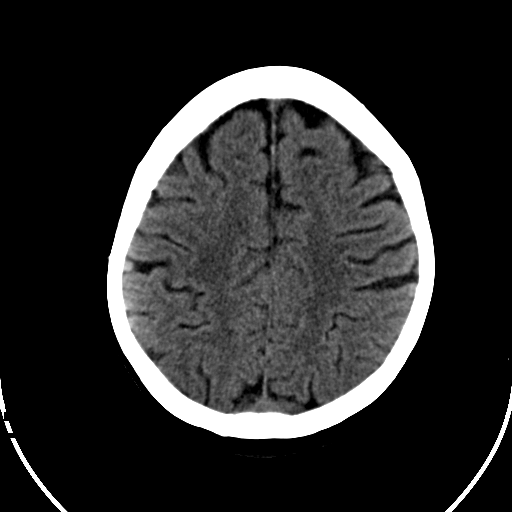
[im 24/32  bone]
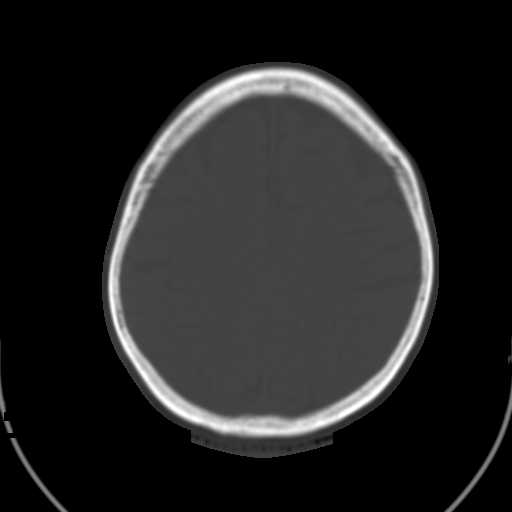
[im 26/32  brain]
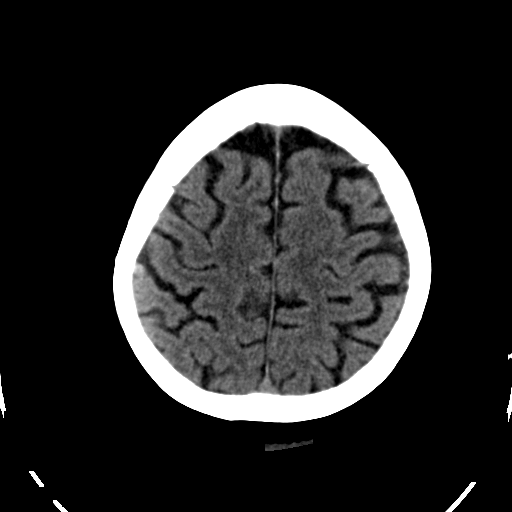
[im 28/32  brain]
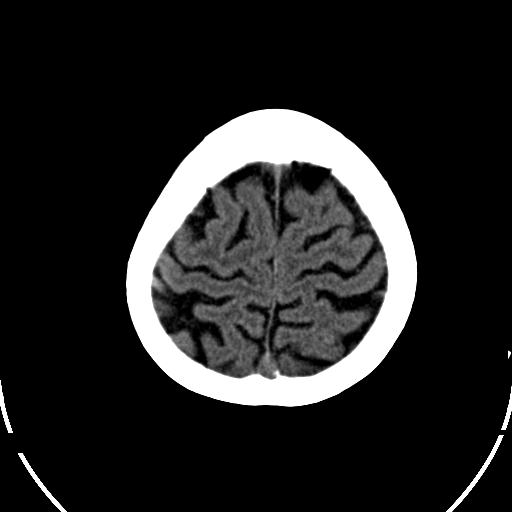
[im 30/32  brain]
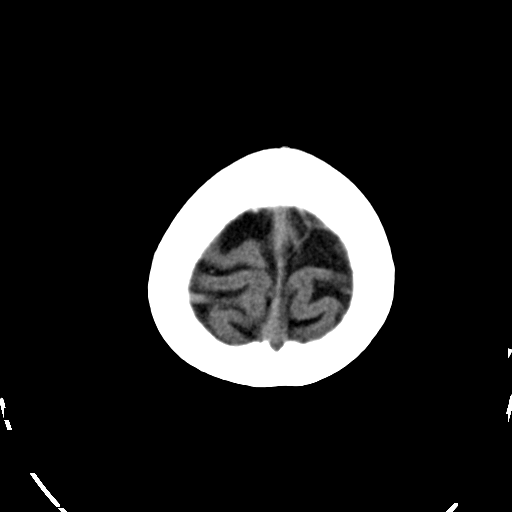

[16 of 30 positions shown; findings below may reference images not displayed]

CT HEAD WITHOUT CONTRAST:

Routine noncontrast CT head.
No prior exams currently available for comparison

Mild generalized atrophy.
Normal ventricular morphology.
No midline shift or mass-effect.
No intracranial hemorrhage, mass, or infarct.
Large left supraorbital scalp hematoma.
Visualized sinuses clear.
No fractures evident.
IMPRESSION: Left supraorbital scalp soft tissue hematoma.
No acute intracranial abnormalities.

## 2006-02-15 IMAGING — CR DG WRIST COMPLETE 3+V*L*
4 series · 4 of 4 positions shown · non-contrast
Comparison: none

HISTORY: Pain, fall

LEFT WRIST 4 VIEWS:
Mild bony demineralization.
No fracture, dislocation, or bone destruction.
Joint spaces preserved.

[view not recorded (1 of 4)]
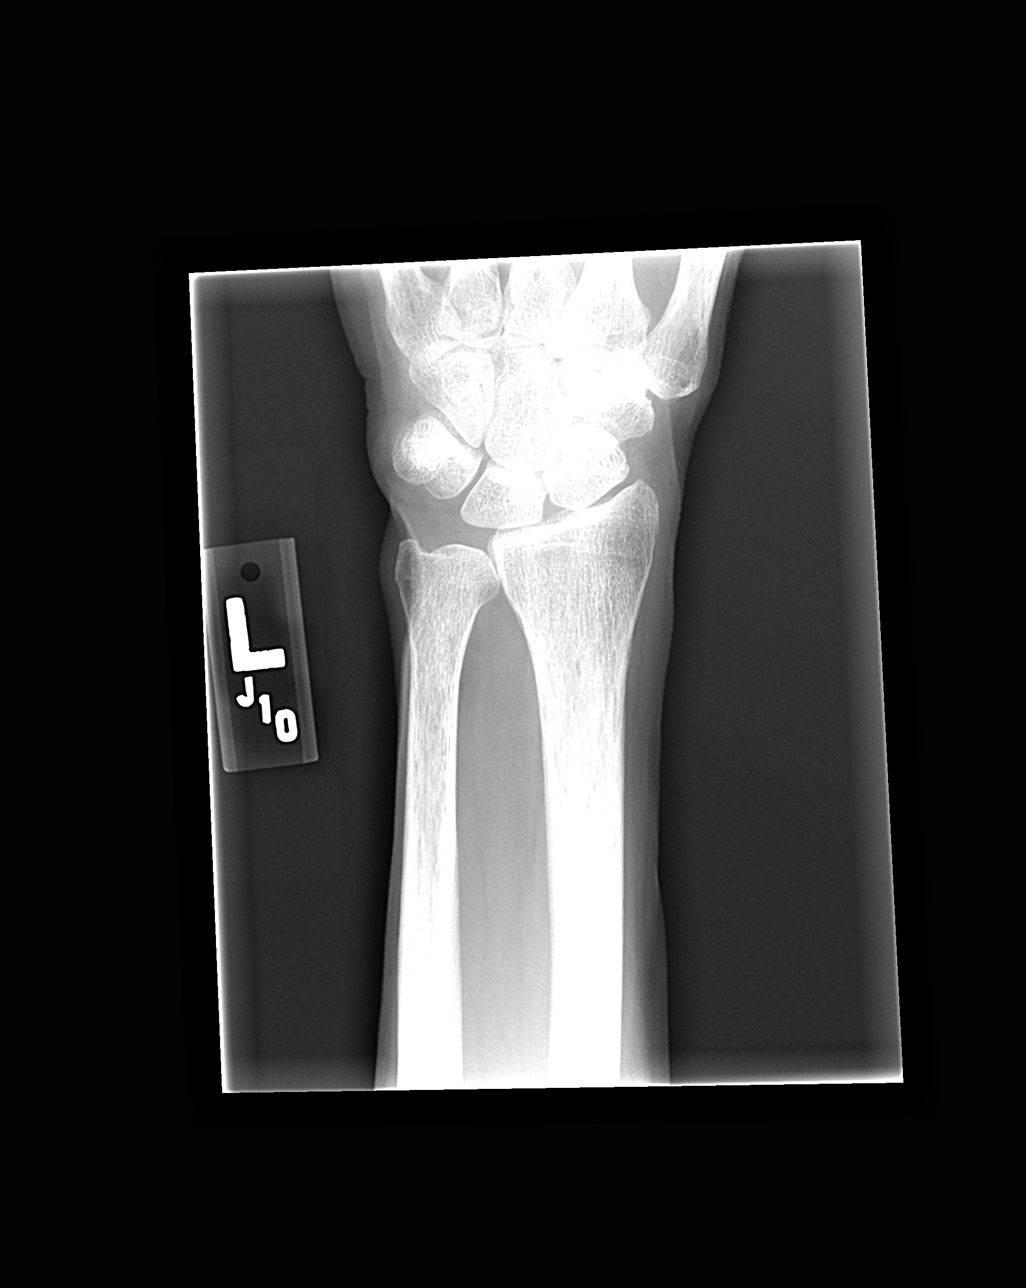

[view not recorded (2 of 4)]
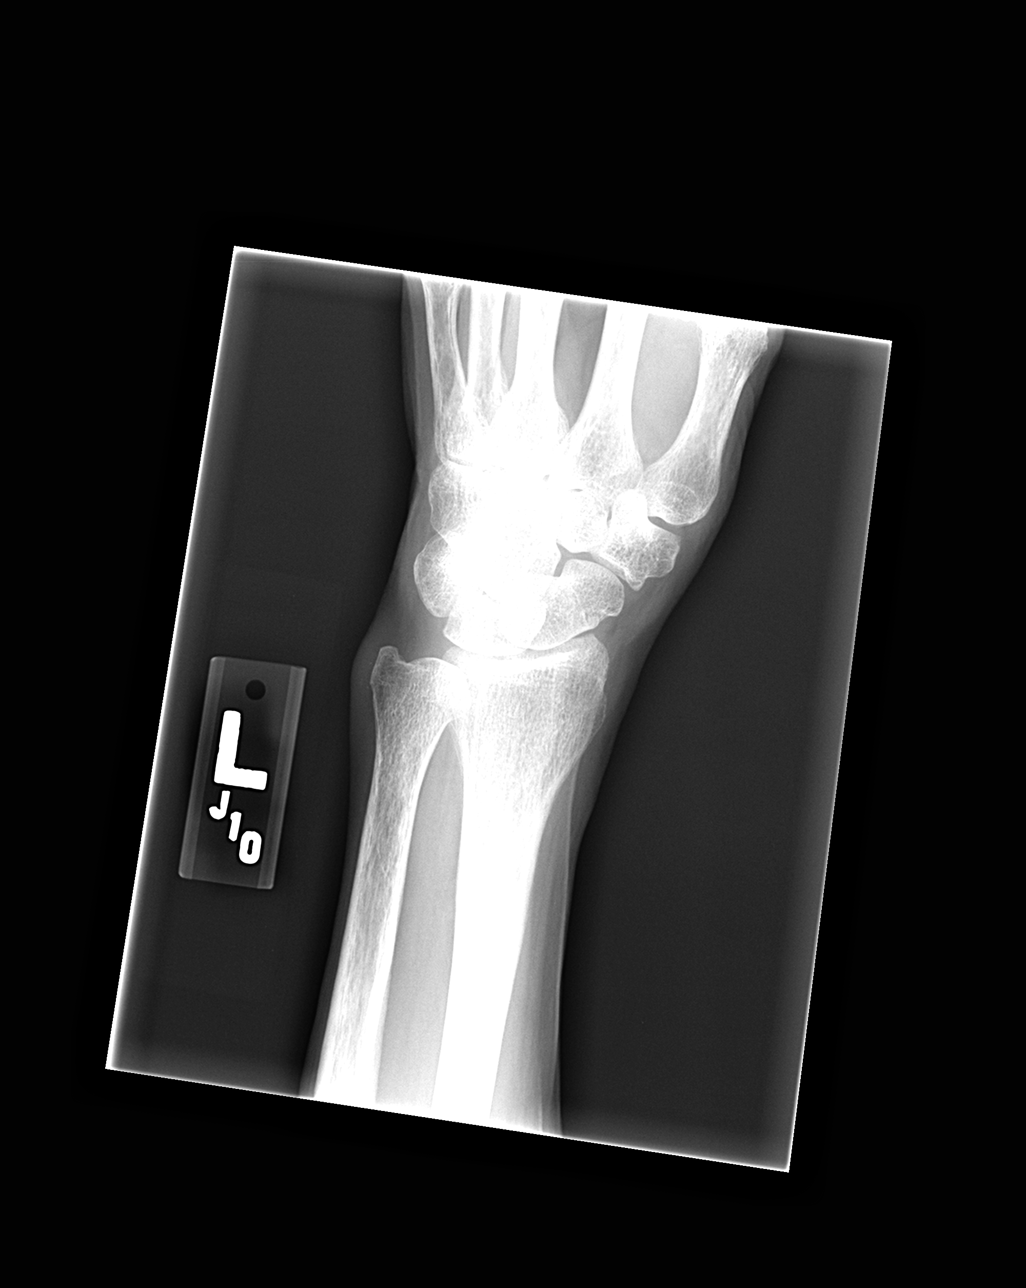

[view not recorded (3 of 4)]
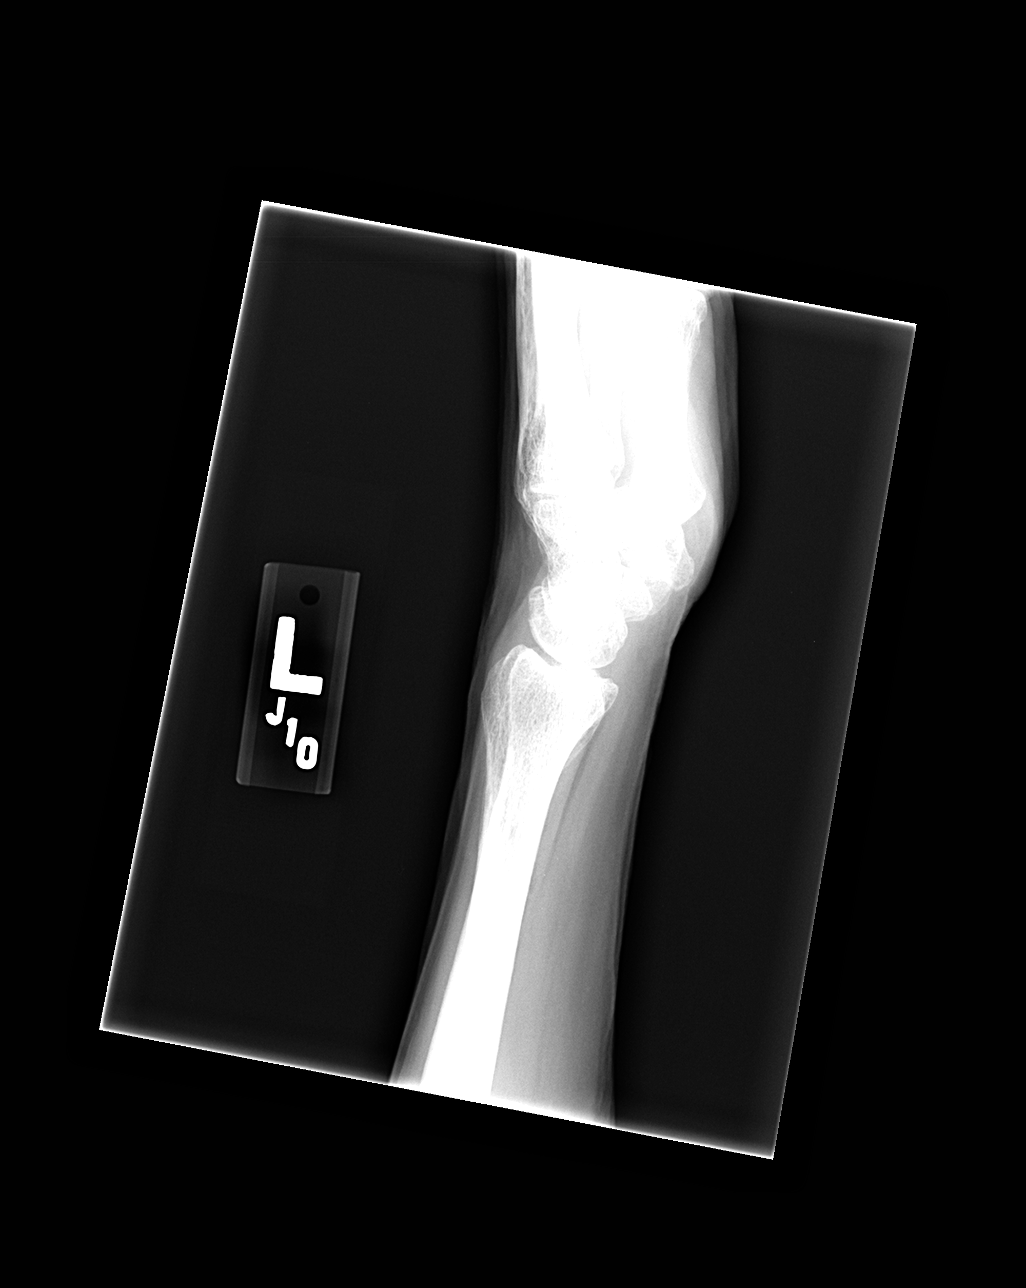

[view not recorded (4 of 4)]
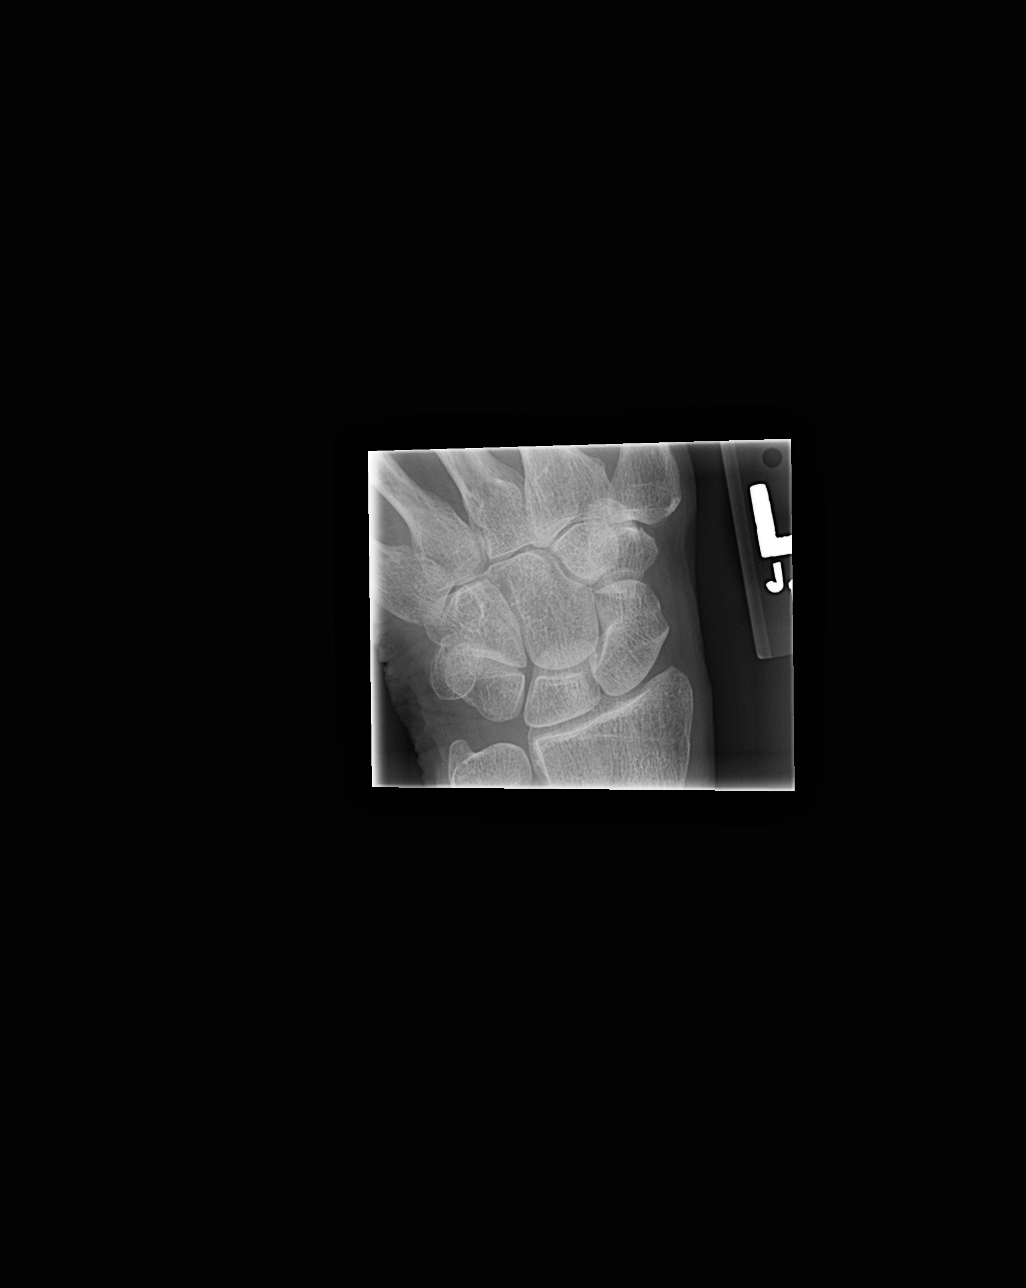

[4 of 4 positions shown; findings below may reference images not displayed]

IMPRESSION: Bony demineralization without acute bony abnormality.

## 2006-02-19 ENCOUNTER — Ambulatory Visit: Payer: Self-pay | Admitting: Family Medicine

## 2006-06-29 IMAGING — MR MR BREAST BILAT WO/W CM
12 of 20 series · 26 of 48 positions shown · IV contrast (20 ML MAGNEVIST)
Comparison: none

MR BREAST BILATERAL WITH/WITHOUT CONTRAST:

CLINICAL:   Silicone injections.
Axial multisequence and dynamic images are acquired through both breasts prior to and following the
administration of 20 cc of Magnevist.  Three dimensional images are evaluated at the independent 
[employer] workstation.
Comparison is made with study from The [REDACTED] dated 10-30-05.
Innumerable silicone granulomata are seen throughout the breast bilaterally.  However, there is no 
asymmetric enhancement or parenchymal mass to suggest the presence of malignancy in either breast. 
No enlarged internal mammary or axillary lymph nodes are seen.

[Series 2: T2 · axial · 3.0mm · 0.80mm/px · 1 of 48 slices shown]
[im 1/48]
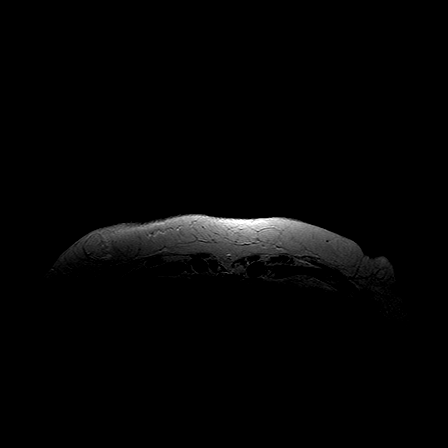

[Series 3: STIR · axial · 3.0mm · 0.70mm/px · 1 of 48 slices shown]
[im 1/48]
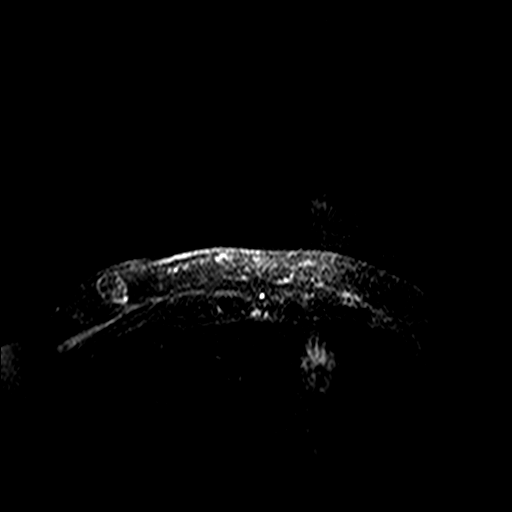

[Series 4: fl3d pre-cm · axial · non-contrast · 1.0mm · 0.80mm/px · z∈[-93,+66]mm · 3 of 160 slices shown]
[im 1/160]
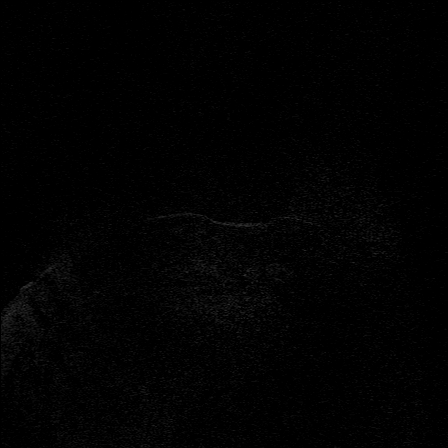
[im 80/160]
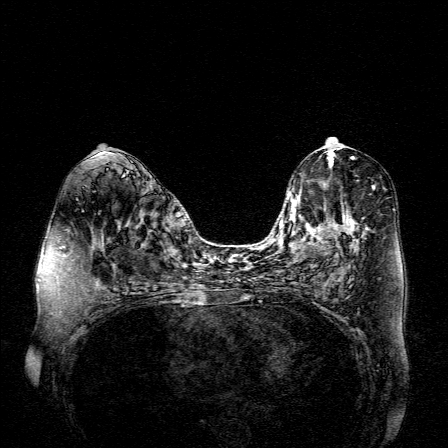
[im 160/160]
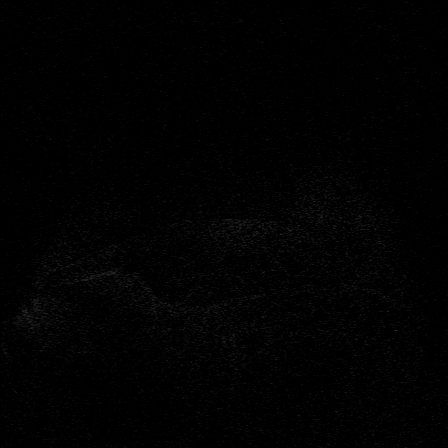

[Series 5: fl3d post 1 · axial · 1.0mm · 0.80mm/px · z∈[-93,+66]mm · 3 of 160 slices shown (1 of 3)]
[im 1/160]
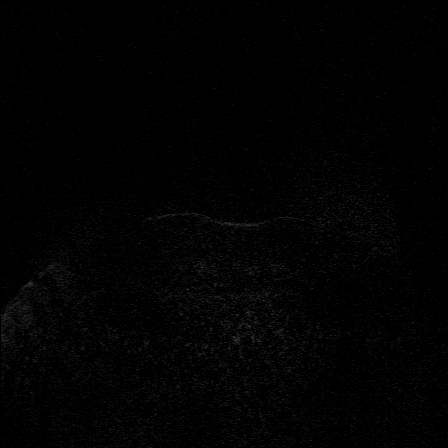
[im 80/160]
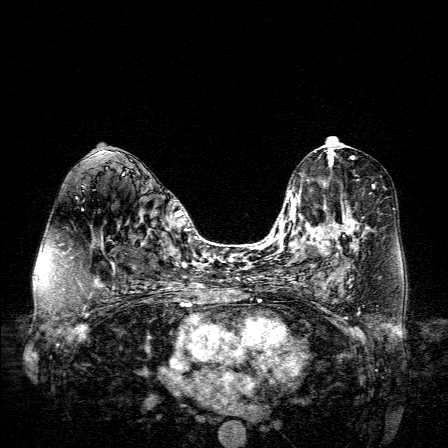
[im 160/160]
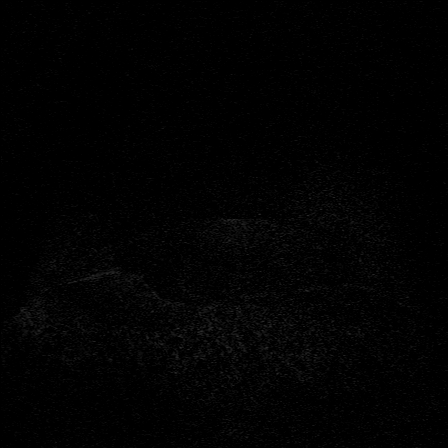

[Series 6: fl3d post 1 · axial · 1.0mm · 0.80mm/px · z∈[-93,+66]mm · 3 of 160 slices shown (2 of 3)]
[im 1/160]
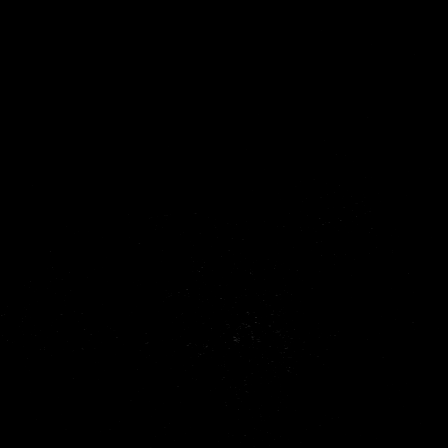
[im 80/160]
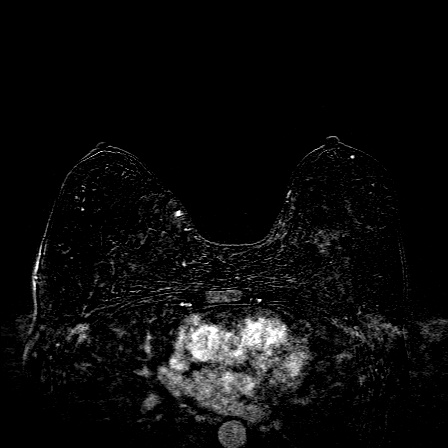
[im 160/160]
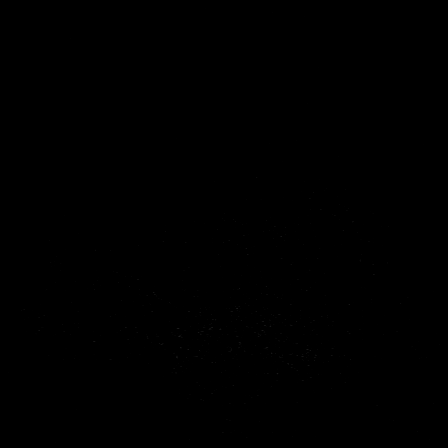

[Series 7: fl3d post 1 · axial · 160.0mm · 0.80mm/px · 1 of 1 slices shown (3 of 3)]
[im 1/1]
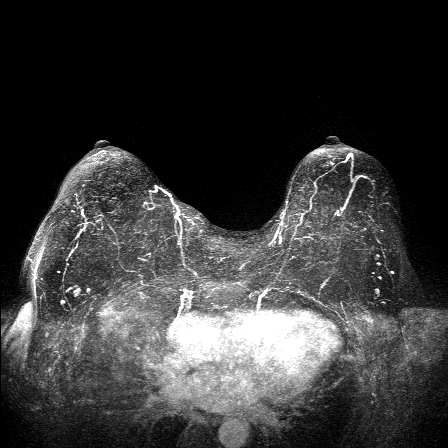

[Series 8: fl3d post 2 · axial · 1.0mm · 0.80mm/px · z∈[-93,+66]mm · 3 of 160 slices shown]
[im 1/160]
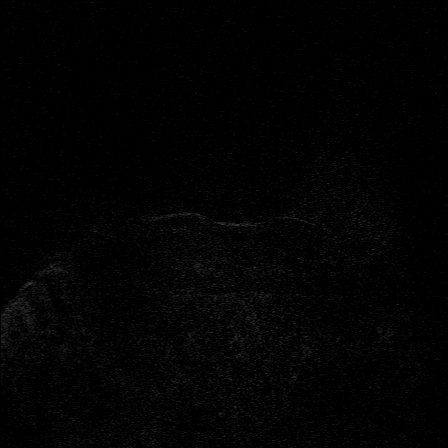
[im 80/160]
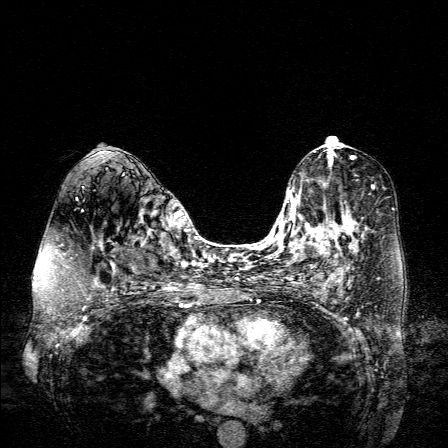
[im 160/160]
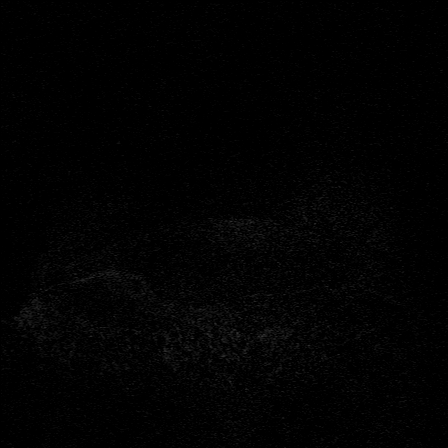

[Series 9: fl3d post 2_sub · axial · 1.0mm · 0.80mm/px · z∈[-93,+66]mm · 3 of 160 slices shown]
[im 1/160]
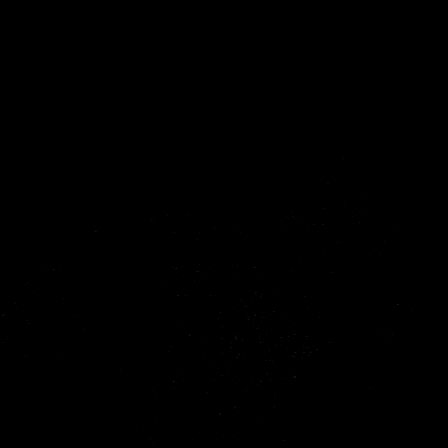
[im 80/160]
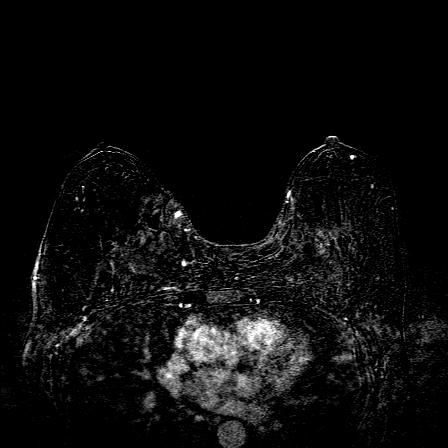
[im 160/160]
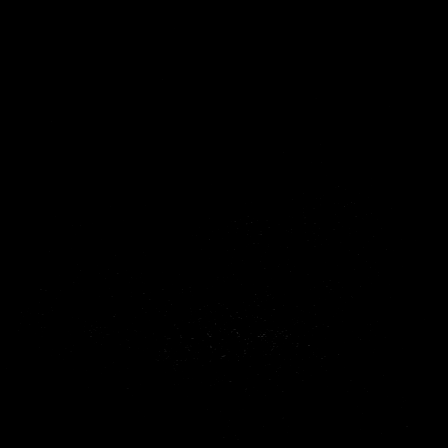

[Series 10: fl3d post 2_sub_mip_tra · axial · 160.0mm · 0.80mm/px · 1 of 1 slices shown]
[im 1/1]
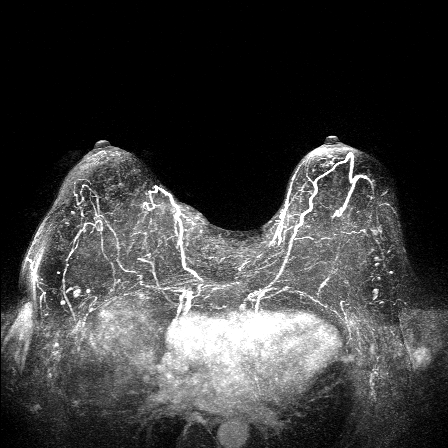

[Series 11: fl3d post 3 · axial · 1.0mm · 0.80mm/px · z∈[-93,+66]mm · 3 of 160 slices shown]
[im 1/160]
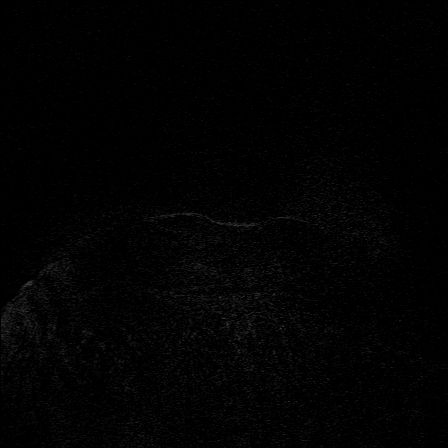
[im 80/160]
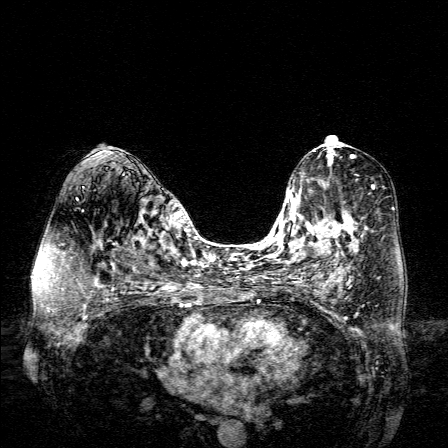
[im 160/160]
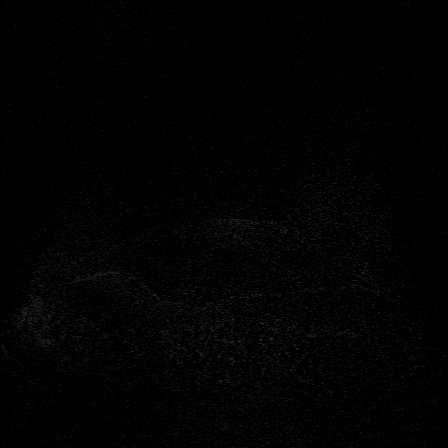

[Series 12: fl3d post 3_sub · axial · 1.0mm · 0.80mm/px · z∈[-93,+66]mm · 3 of 160 slices shown]
[im 1/160]
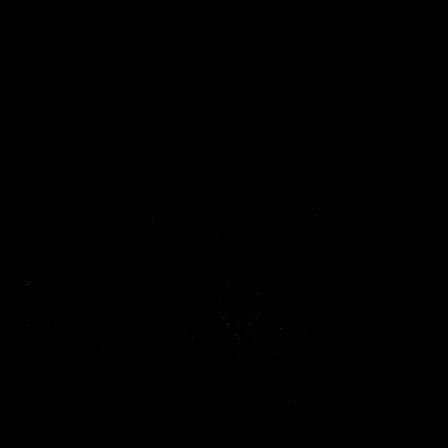
[im 80/160]
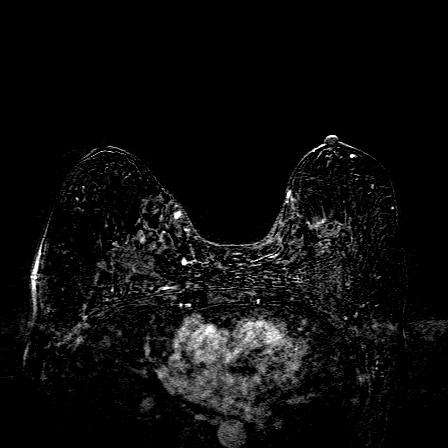
[im 160/160]
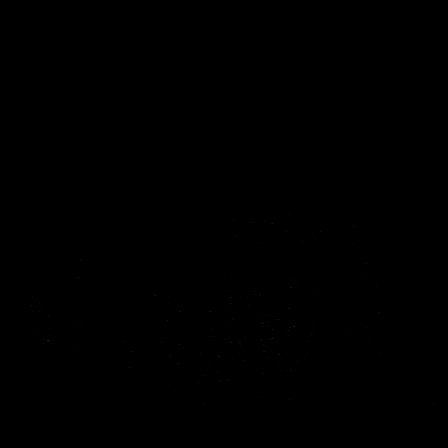

[Series 13: fl3d post 3_sub_mip_tra · axial · 160.0mm · 0.80mm/px · 1 of 1 slices shown]
[im 1/1]
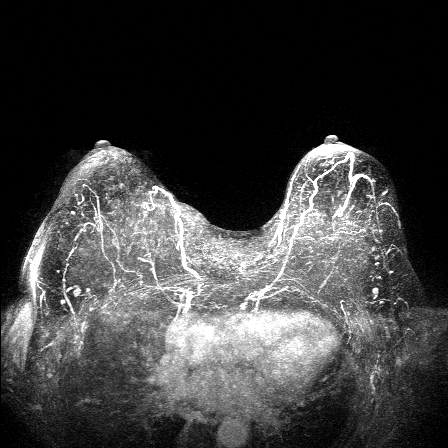

[26 of 48 positions shown; findings below may reference images not displayed]

IMPRESSION: Innumerable silicone granulomata throughout the breasts bilaterally.  However, there is no evidence
for malignancy.  I would suggest a mammogram and MRI be performed every year given the difficulty 
of screening this patient using mammography alone.

I called the patient to discuss the findings.  She is very concerned about a palpable red mass in 
the upper inner part of the right breast.  For this reason, it would be reasonable to have the 
patient see a surgeon about possible excisional biopsy.  I discussed this with the patient, and 
will discuss the findings with Dr. Marie Vestine.

3-DIMENSIONAL MR IMAGE RENDERING ON INDEPENDENT WORKSTATION:
3-dimensional MR images were rendered by post-processing of the original MR data on an independent 
workstation.  The 3-dimensional MR images were interpreted, and findings were reported in the 
accompanying complete MRI report for this study.

ASSESSMENT: Benign - BI-RADS 2

Screening mammogram and breast MRI of both breasts in 1 year.

## 2006-07-09 ENCOUNTER — Ambulatory Visit: Payer: Self-pay | Admitting: Family Medicine

## 2006-09-07 IMAGING — CR DG ABDOMEN ACUTE W/ 1V CHEST
4 series · 4 of 4 positions shown · non-contrast
Comparison: none

CLINICAL DATA: Nausea and vomiting, pain all over. History of umbilical hernia repair. 
ACUTE ABDOMINAL SERIES WITH CHEST - 3 VIEW: 
Thoracic scoliosis convex to the right and lumbar rotoscoliosis convex to the left.   No evidence of active cardiopulmonary disease.   Approximately 1.7 x 2.0 cluster or calcifications in the medial aspect of the right upper quadrant with layering on the decubitus views consisting of gallstones or probably a less likely calcium carbonate crystals/milk of calcium in a right upper pole renal lesion such as a hydrocalyx.  I would favor that the findings represent gallstones.  Recommend ultrasound for further assessment.  Unremarkable bowel gas pattern.

[w abdomen decub (1 of 2)]
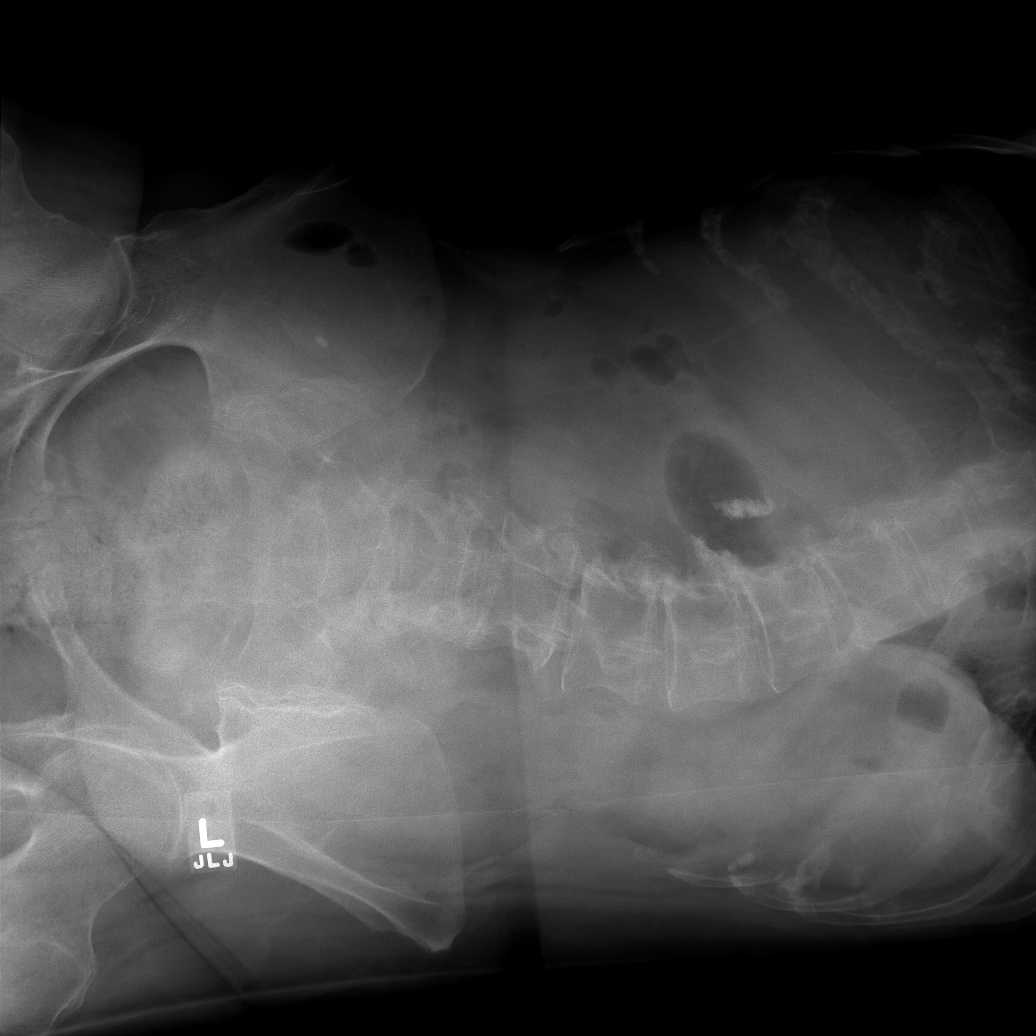

[w abdomen decub (2 of 2)]
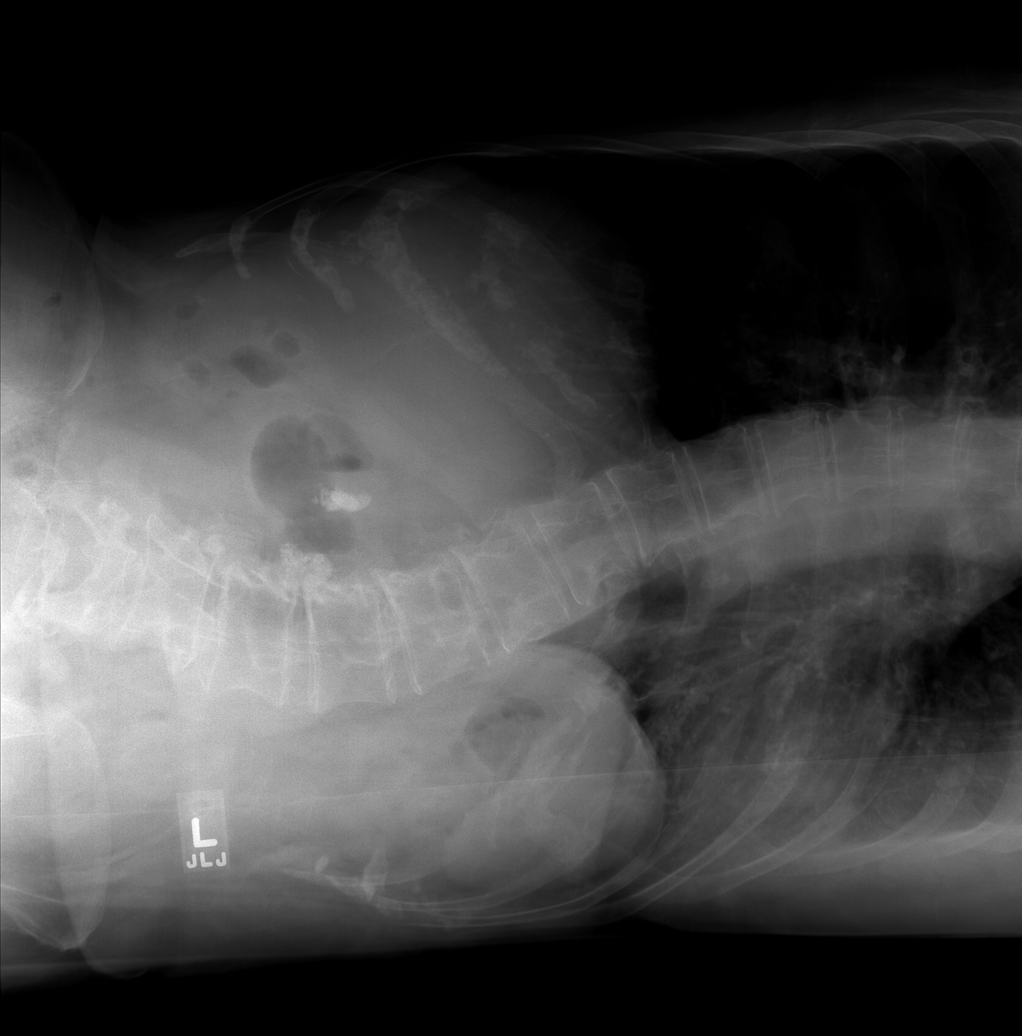

[view not recorded (1 of 2)]
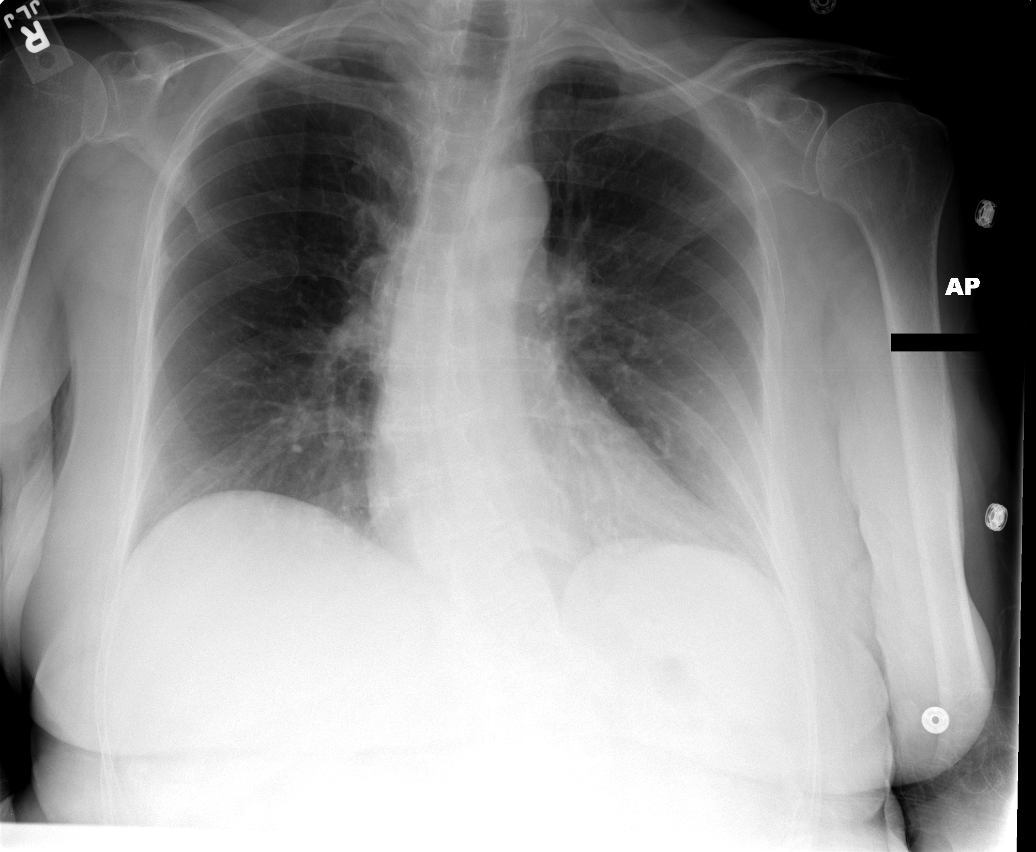

[view not recorded (2 of 2)]
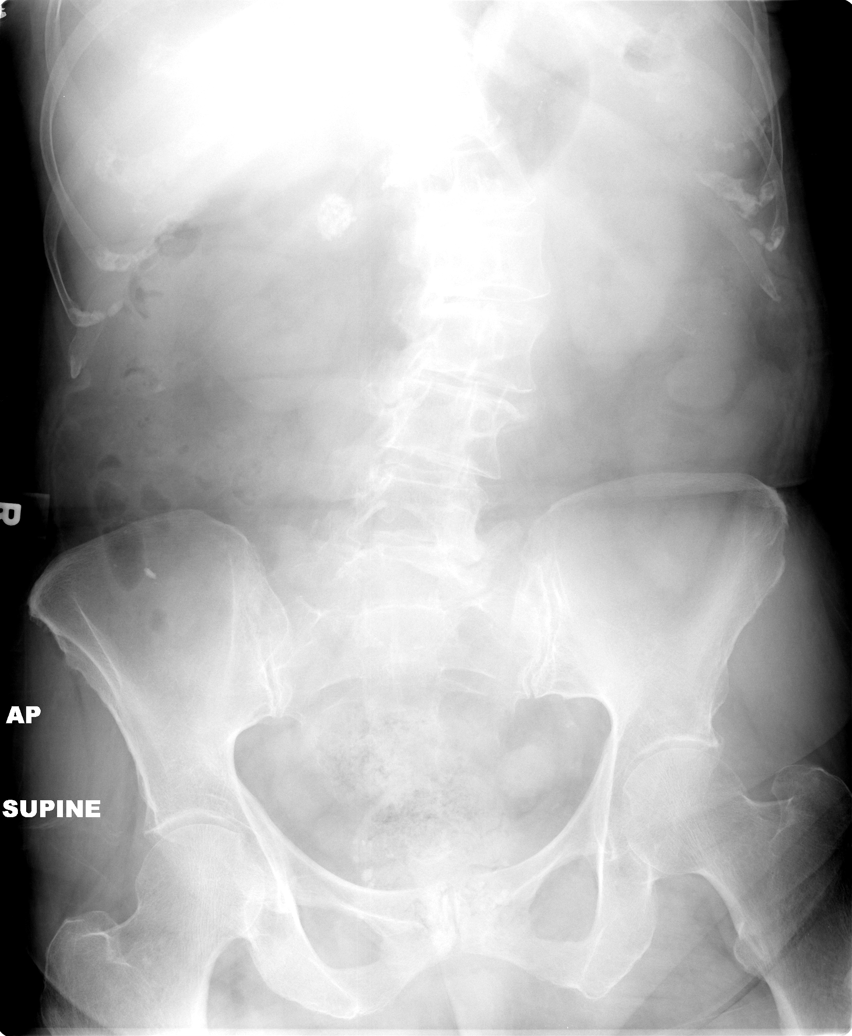

[4 of 4 positions shown; findings below may reference images not displayed]

IMPRESSION: Layering calcifications right upper quadrant, probably gallstones. Recommend abdominal ultrasound.

## 2006-09-07 IMAGING — US US ABDOMEN COMPLETE
1 series · 14 of 25 positions shown · non-contrast
Comparison: none

CLINICAL DATA: Abdominal pain, nausea and vomiting.
 ABDOMEN ULTRASOUND:
TECHNIQUE: Complete abdominal ultrasound examination was performed including evaluation of the liver, gallbladder, bile ducts, pancreas, kidneys, spleen, IVC, and abdominal aorta.

[Series 1: unknown · 0.33mm/px · 14 of 66 slices shown]
[im 1/66]
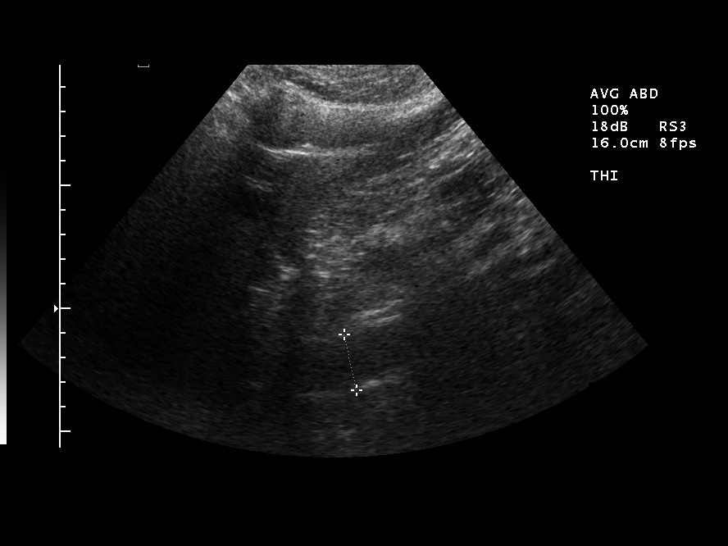
[im 6/66]
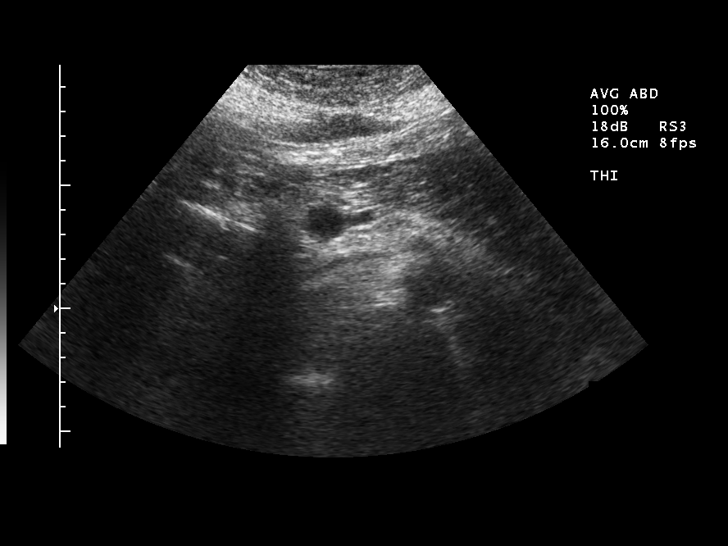
[im 11/66]
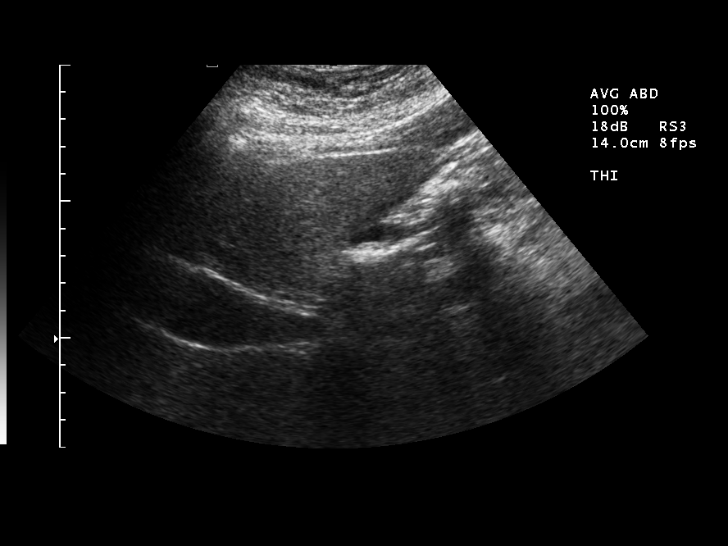
[im 17/66]
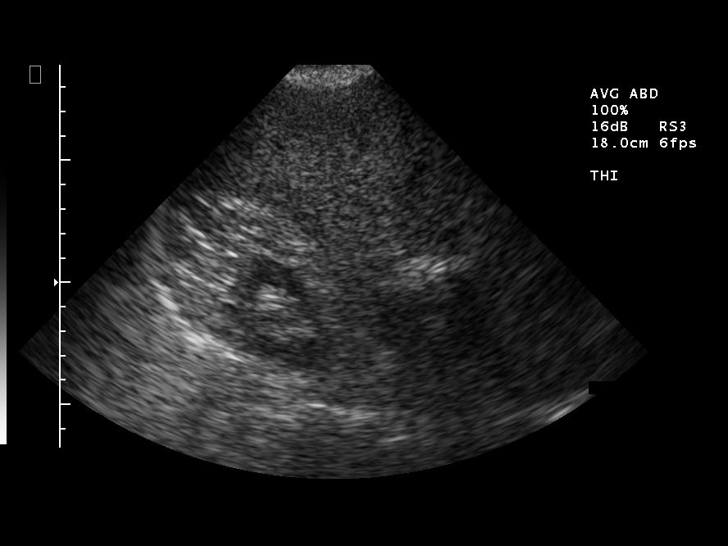
[im 22/66]
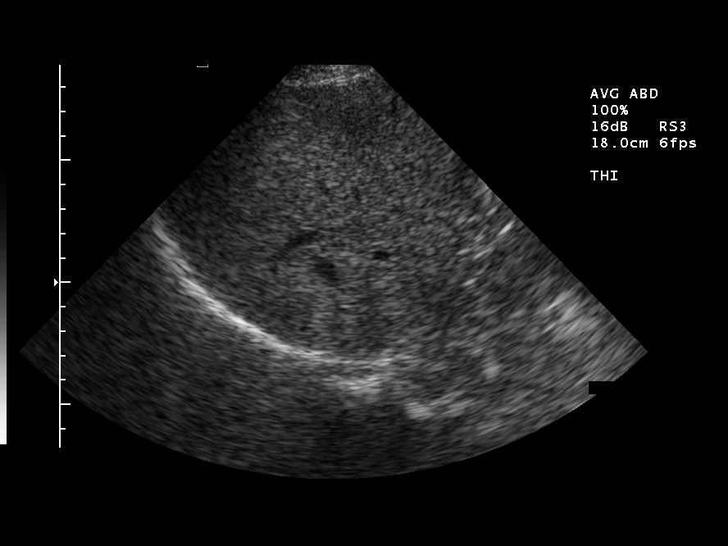
[im 25/66]
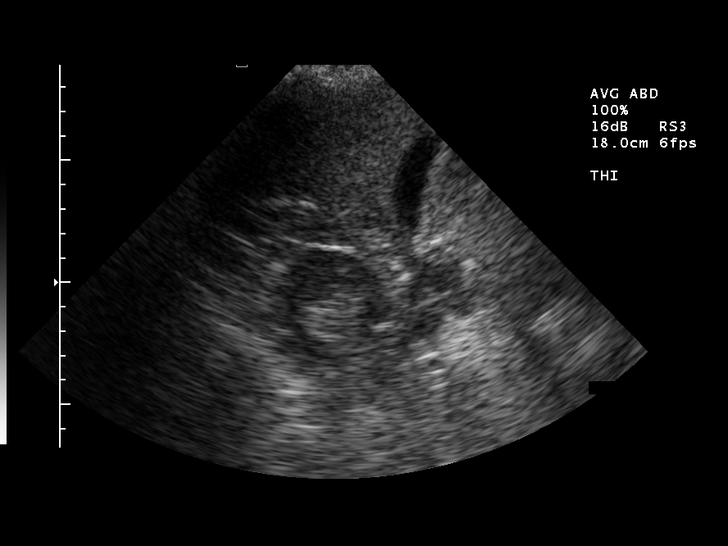
[im 30/66]
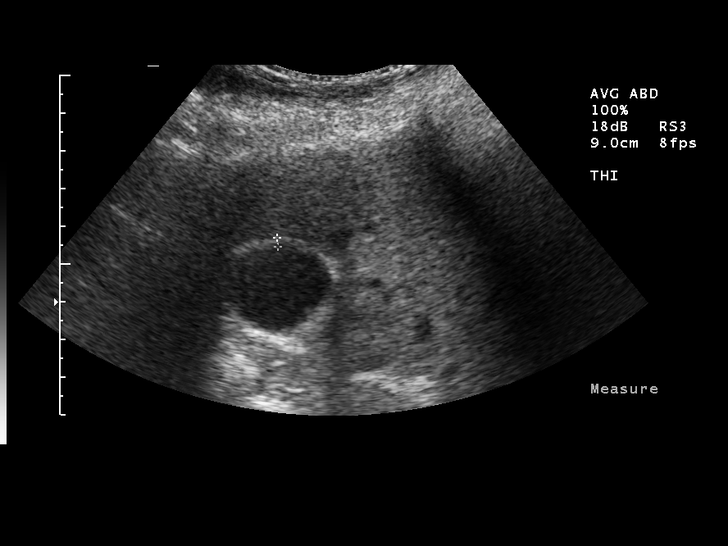
[im 36/66]
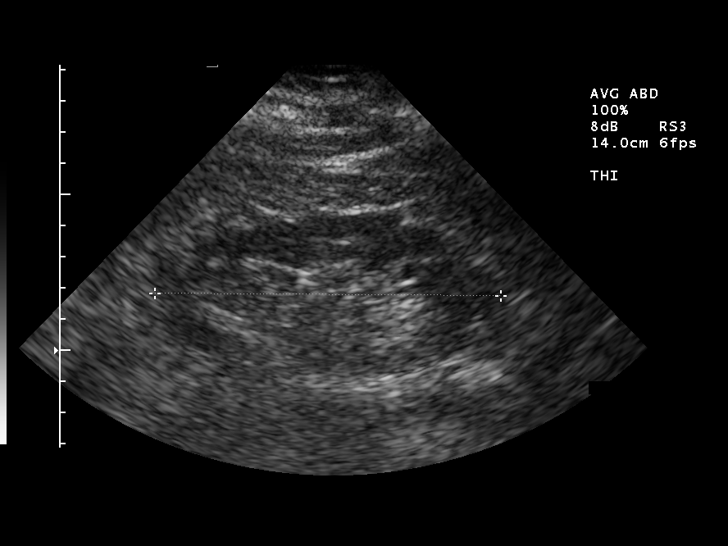
[im 41/66]
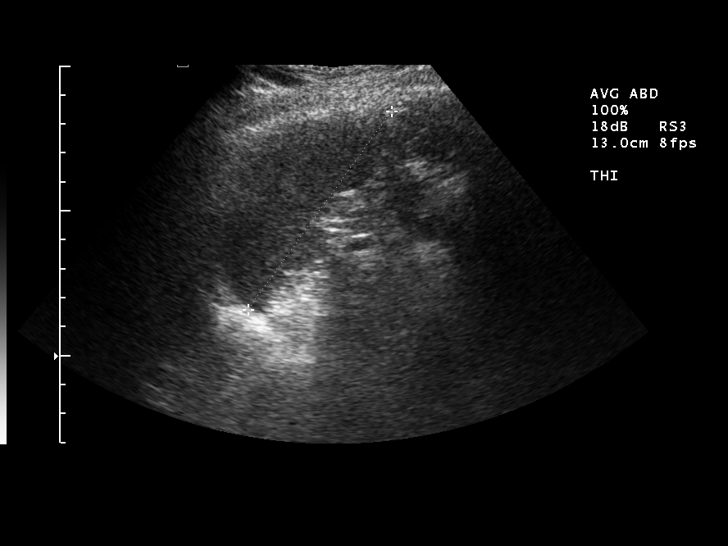
[im 44/66]
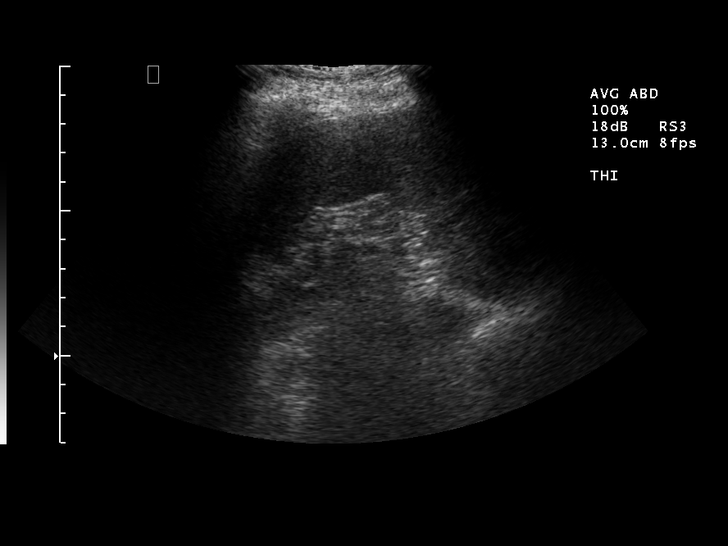
[im 49/66]
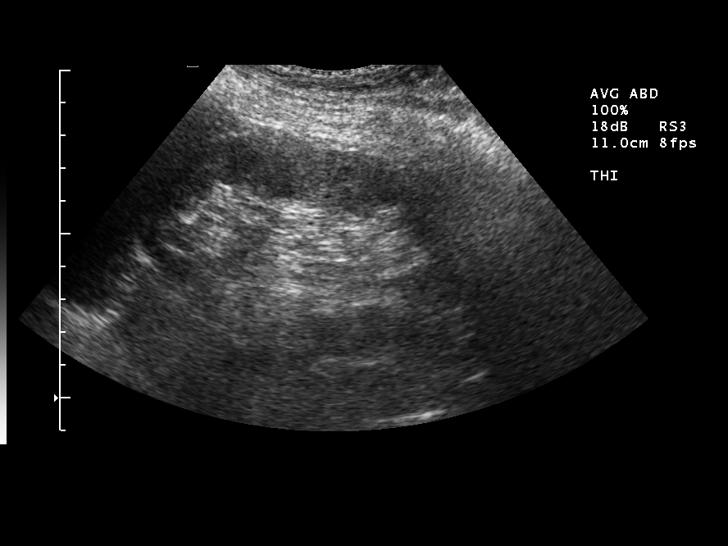
[im 55/66]
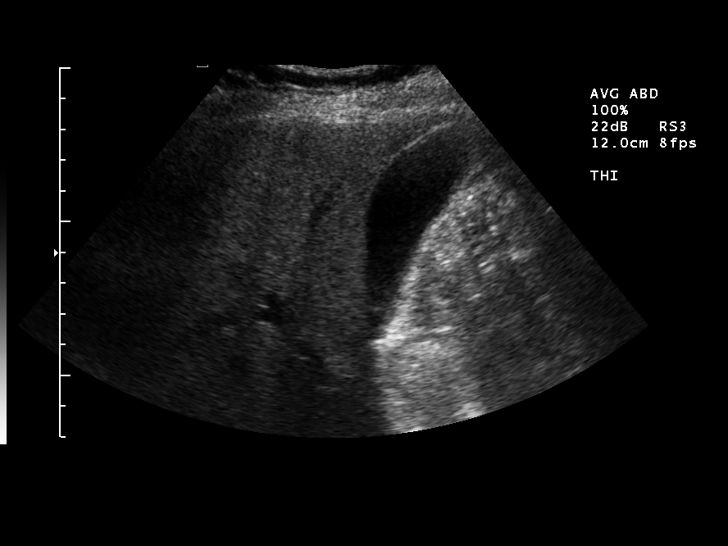
[im 60/66]
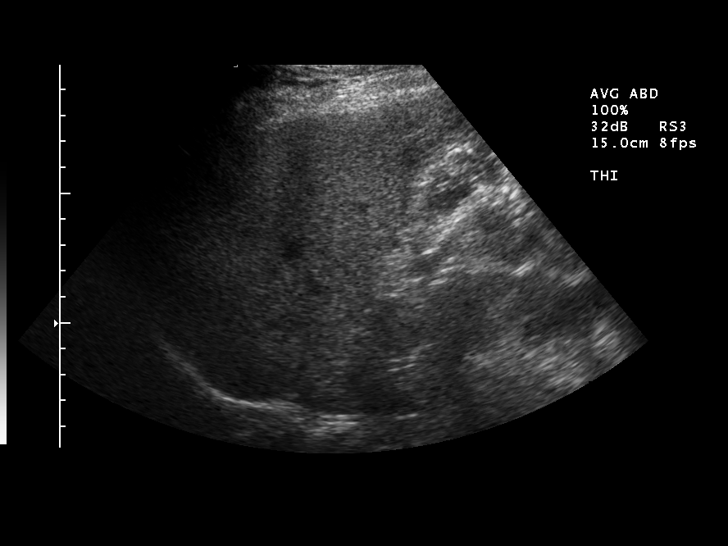
[im 66/66]
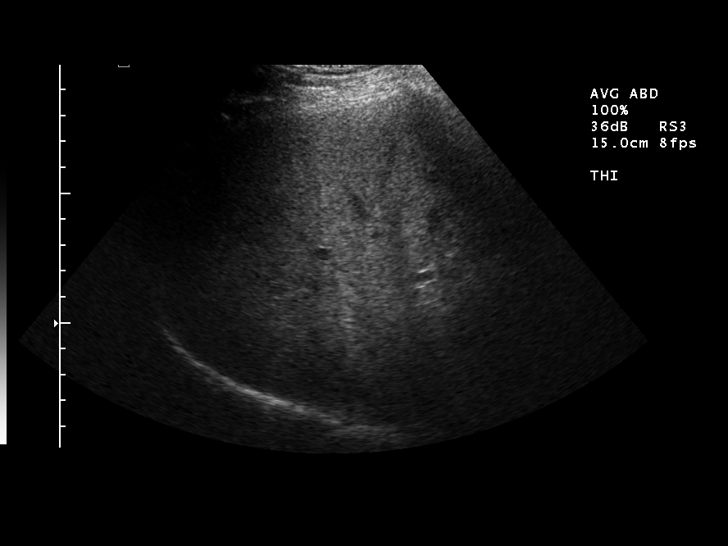

[14 of 25 positions shown; findings below may reference images not displayed]

FINDINGS: Gallbladder normal. No wall thickening or stones.  Gallbladder wall measures 2.4 mm.
 The common bile duct is normal in caliber measuring 3.2 mm.  Diffuse increased echogenicity throughout the liver parenchyma consistent with fatty infiltration.  IVC and pancreas not visualized due to overlying bowel gas.  Spleen negative measuring 8.4 cm.  Right kidney is normal measuring 11.3 cm.  Left kidney normal measuring 10.3 cm. Abdominal aorta is normal in AP diameter measuring 2.3 cm.
IMPRESSION: 1.  No acute findings.
 2.  No evidence for gallbladder disease.
 3.  Fatty infiltration of the liver.

## 2006-09-17 ENCOUNTER — Ambulatory Visit: Payer: Self-pay | Admitting: Family Medicine

## 2006-11-06 ENCOUNTER — Ambulatory Visit: Payer: Self-pay | Admitting: Family Medicine

## 2006-12-07 ENCOUNTER — Observation Stay (HOSPITAL_COMMUNITY): Admission: EM | Admit: 2006-12-07 | Discharge: 2006-12-08 | Payer: Self-pay | Admitting: *Deleted

## 2006-12-07 ENCOUNTER — Ambulatory Visit: Payer: Self-pay | Admitting: Internal Medicine

## 2007-02-11 ENCOUNTER — Ambulatory Visit: Payer: Self-pay | Admitting: Family Medicine

## 2007-02-22 ENCOUNTER — Inpatient Hospital Stay (HOSPITAL_COMMUNITY): Admission: EM | Admit: 2007-02-22 | Discharge: 2007-02-27 | Payer: Self-pay | Admitting: Emergency Medicine

## 2007-02-23 ENCOUNTER — Ambulatory Visit: Payer: Self-pay | Admitting: Internal Medicine

## 2007-03-03 ENCOUNTER — Ambulatory Visit: Payer: Self-pay | Admitting: Family Medicine

## 2007-03-19 ENCOUNTER — Ambulatory Visit: Payer: Self-pay | Admitting: Family Medicine

## 2007-04-10 ENCOUNTER — Emergency Department (HOSPITAL_COMMUNITY): Admission: EM | Admit: 2007-04-10 | Discharge: 2007-04-11 | Payer: Self-pay | Admitting: Emergency Medicine

## 2007-05-07 ENCOUNTER — Encounter: Admission: RE | Admit: 2007-05-07 | Discharge: 2007-05-07 | Payer: Self-pay | Admitting: Internal Medicine

## 2007-05-13 ENCOUNTER — Ambulatory Visit: Payer: Self-pay | Admitting: Family Medicine

## 2007-05-21 ENCOUNTER — Ambulatory Visit: Payer: Self-pay | Admitting: Family Medicine

## 2007-07-08 ENCOUNTER — Encounter: Payer: Self-pay | Admitting: Family Medicine

## 2007-07-08 DIAGNOSIS — M4180 Other forms of scoliosis, site unspecified: Secondary | ICD-10-CM | POA: Insufficient documentation

## 2007-07-08 DIAGNOSIS — M810 Age-related osteoporosis without current pathological fracture: Secondary | ICD-10-CM

## 2007-07-08 DIAGNOSIS — K589 Irritable bowel syndrome without diarrhea: Secondary | ICD-10-CM | POA: Insufficient documentation

## 2007-07-08 DIAGNOSIS — E785 Hyperlipidemia, unspecified: Secondary | ICD-10-CM

## 2007-07-08 DIAGNOSIS — M1990 Unspecified osteoarthritis, unspecified site: Secondary | ICD-10-CM

## 2007-09-07 ENCOUNTER — Encounter: Payer: Self-pay | Admitting: Family Medicine

## 2007-10-22 IMAGING — CR DG CHEST 2V
2 series · 2 of 2 positions shown · non-contrast
Comparison: none

CLINICAL DATA: Fever, cough, congestion. 
 CHEST - 2 VIEW:

[w chest pa]
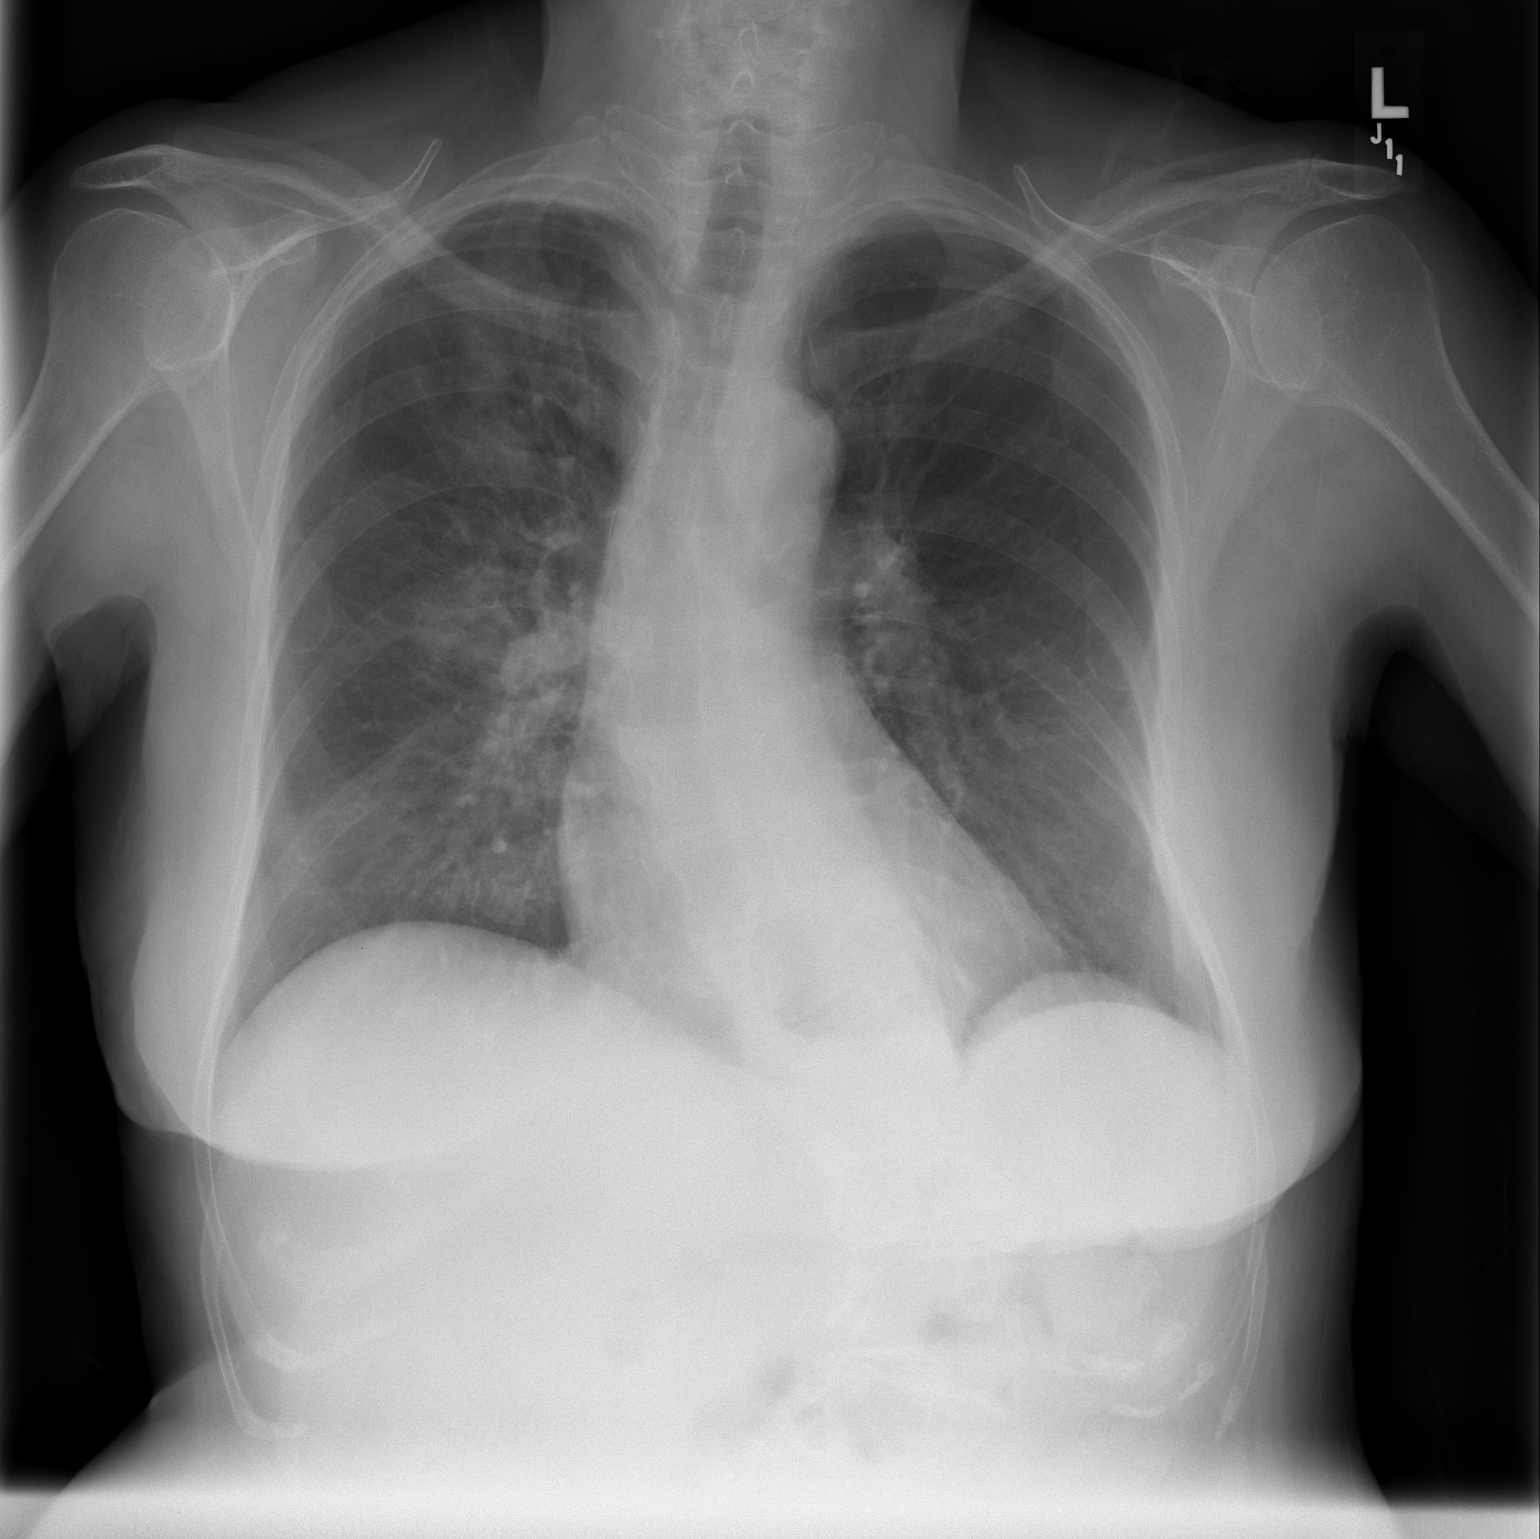

[w chest lat]
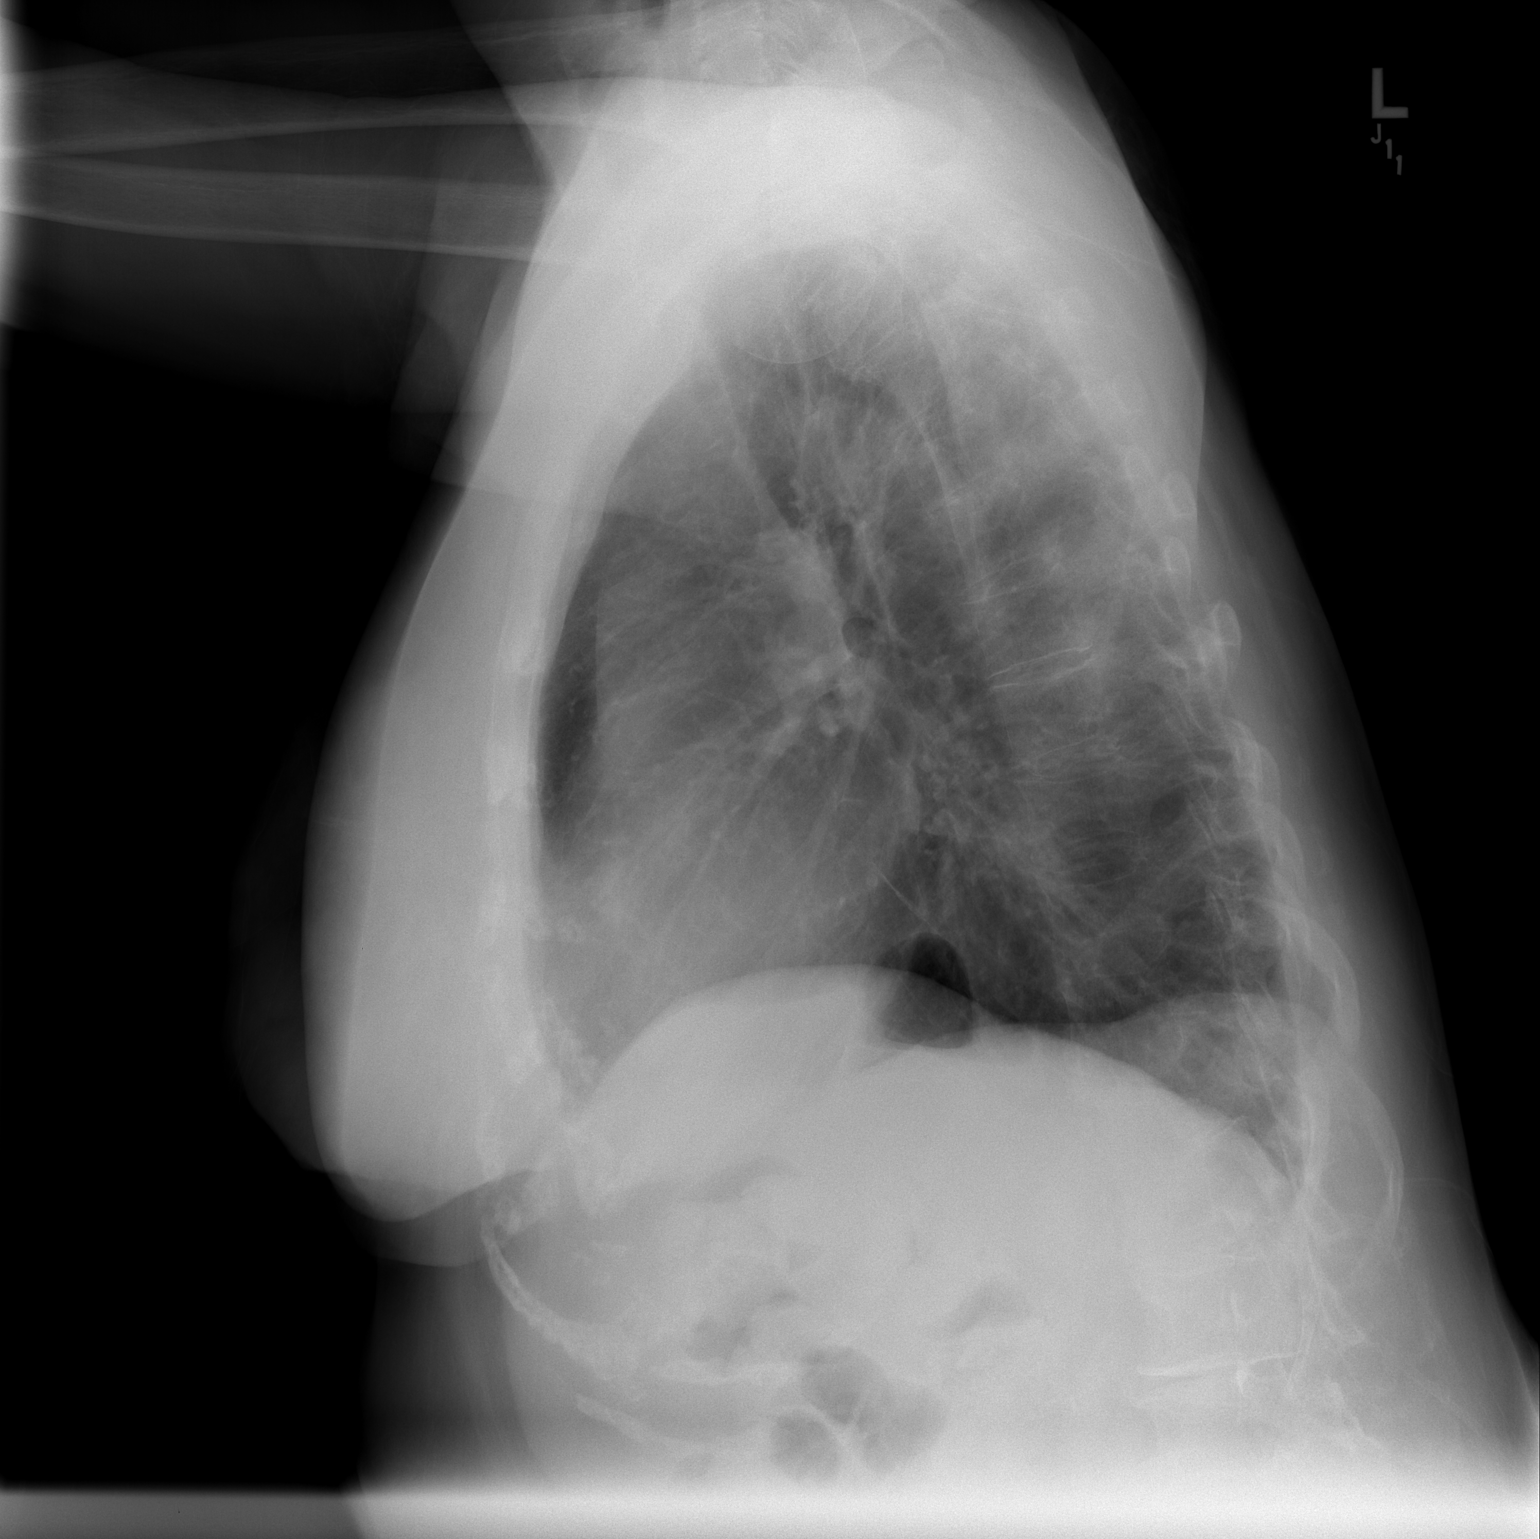

[2 of 2 positions shown; findings below may reference images not displayed]

FINDINGS: Two views of the chest show opacity within the right mid and upper lung field most consistent with pneumonia.  The left lung is clear.  Heart size is within normal limits in size.  There is a thoracolumbar scoliosis present.
IMPRESSION: Opacity in the right mid and upper lung field most consistent with pneumonia.

## 2007-10-23 IMAGING — CR DG CHEST 2V
2 series · 2 of 2 positions shown · non-contrast
Comparison: 02/22/07.

CLINICAL DATA: Chest pain, dizziness.  
 CHEST ? 2 VIEW:

[w chest pa]
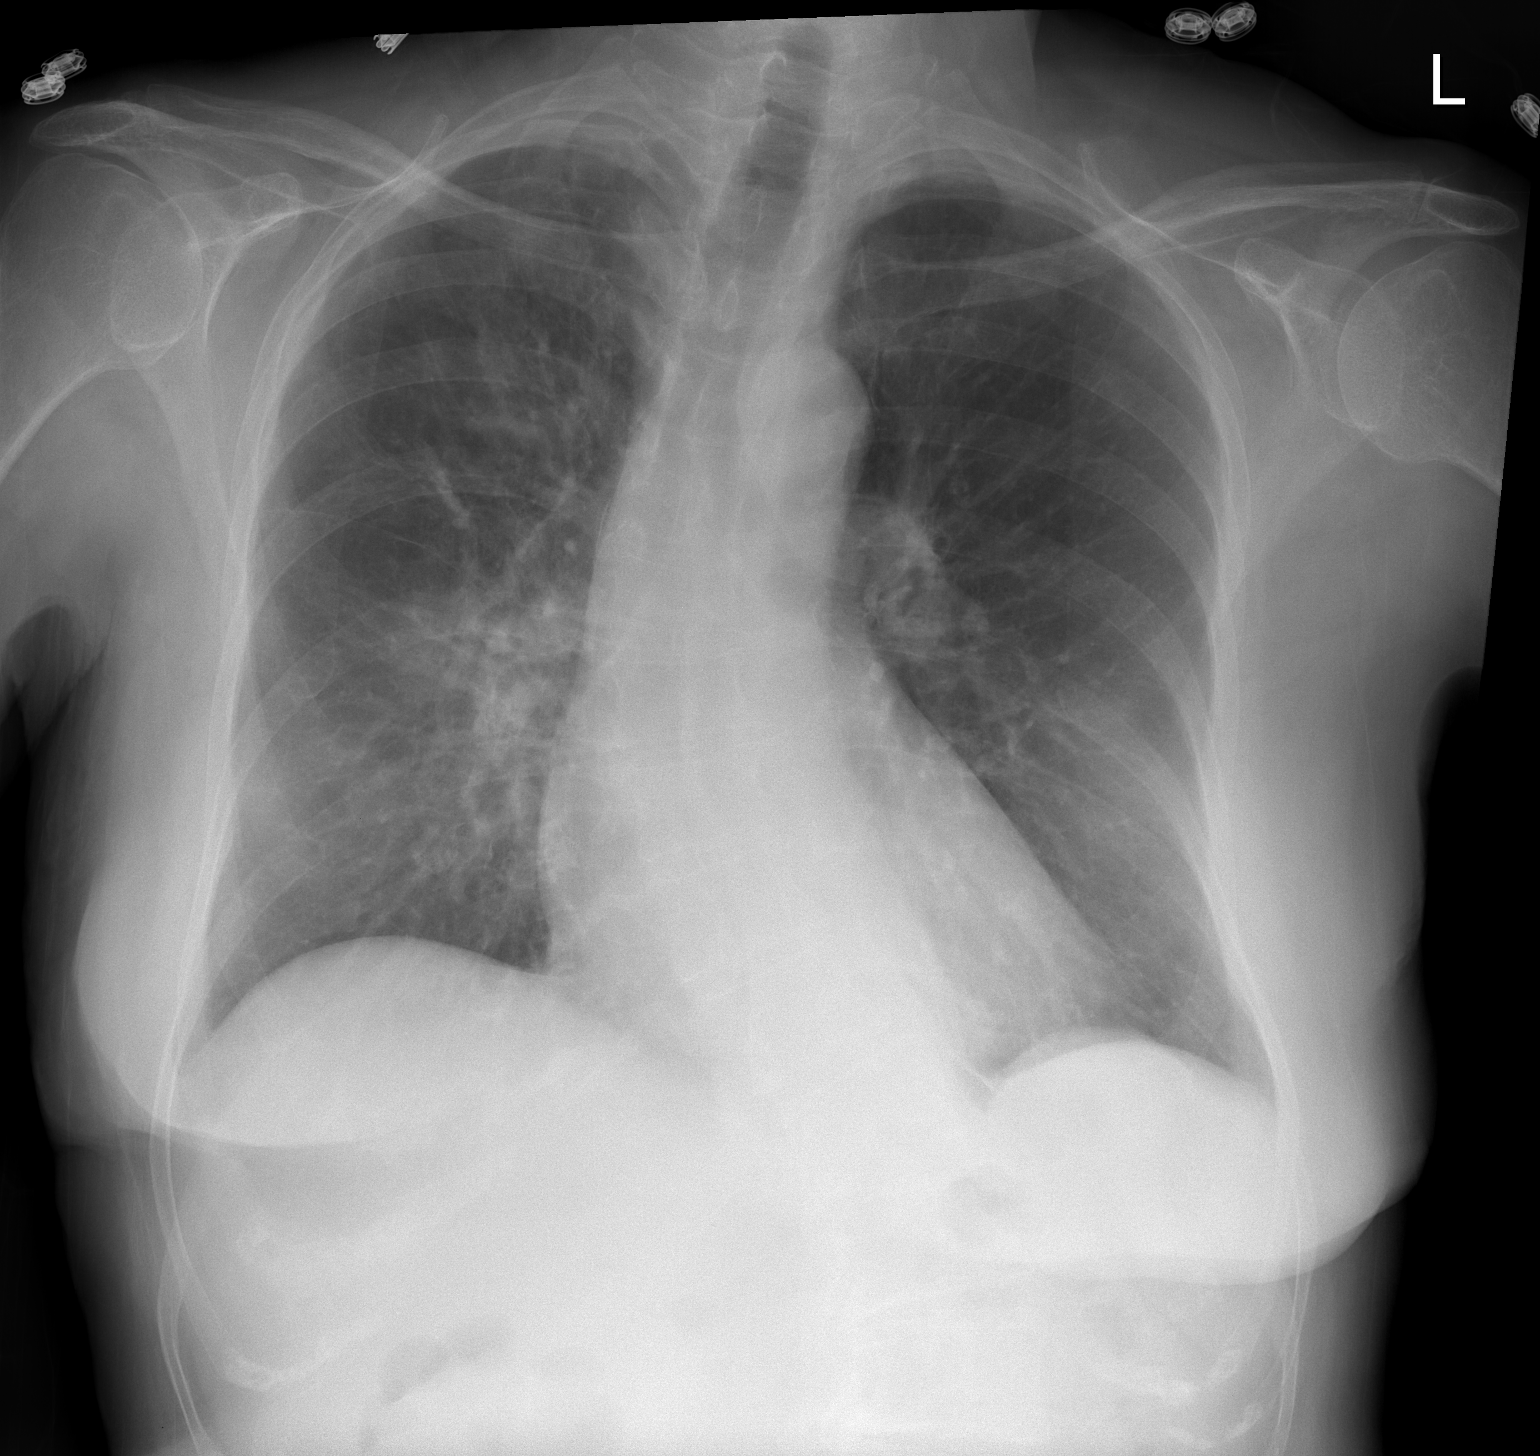

[w chest lat]
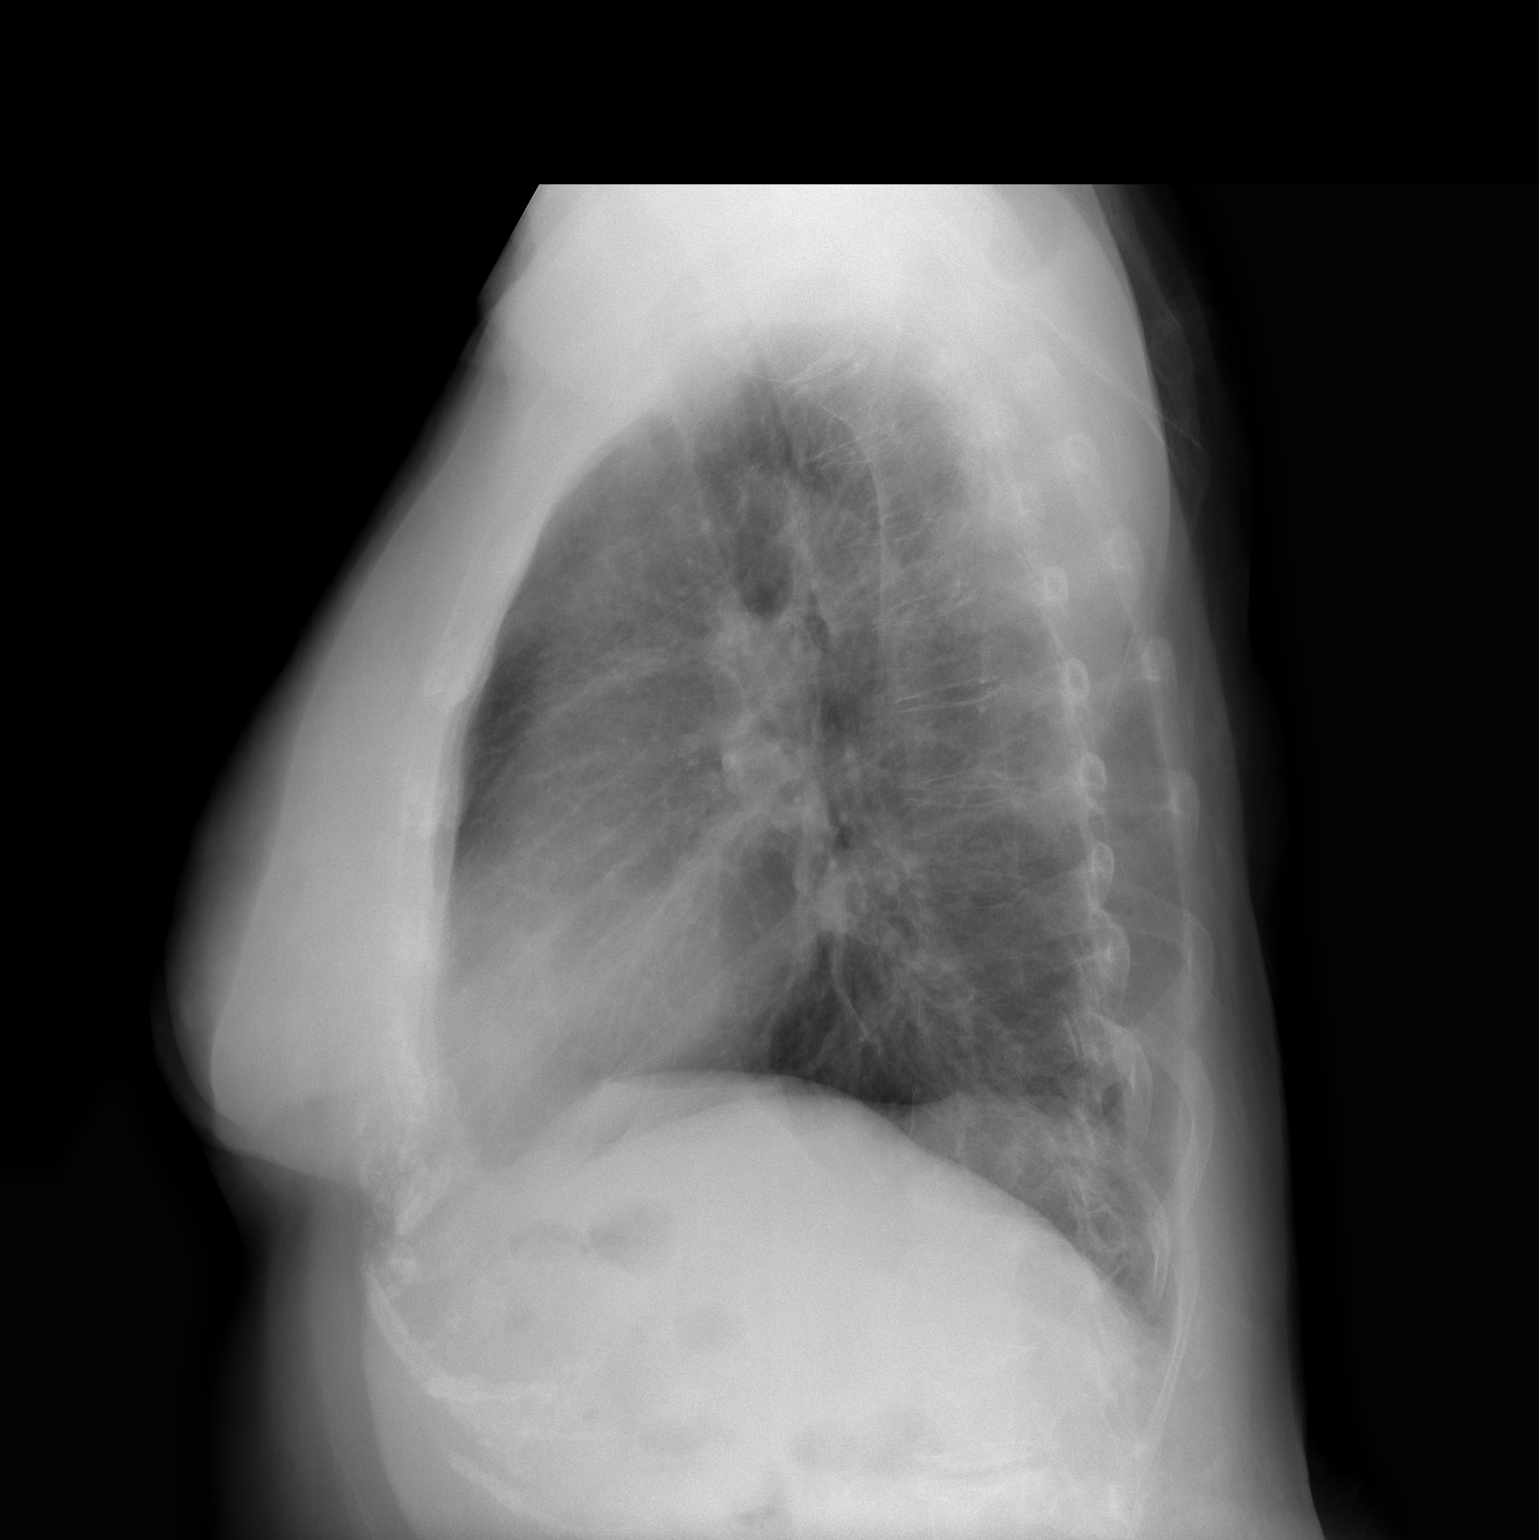

[2 of 2 positions shown; findings below may reference images not displayed]

FINDINGS: Confluent parenchymal opacities right lung redemonstrated.  Findings may reflect an infectious infiltrate.  Left lung is clear.  Heart is normal.  Rotoscoliotic deformity redemonstrated.
IMPRESSION: No acute change.  Suspect infectious process right upper lung.  Follow-up following therapy suggested.

## 2007-10-26 ENCOUNTER — Telehealth: Payer: Self-pay | Admitting: Family Medicine

## 2007-10-26 IMAGING — CR DG HIP W/ PELVIS BILAT
5 series · 5 of 5 positions shown · non-contrast
Comparison: none

CLINICAL DATA: 75-year-old with pneumonia, osteoarthritis, bilateral hip pain.
BILATERAL HIP WITH PELVIS ?  5 VIEW ? 02/26/07:

[t hip ap left]
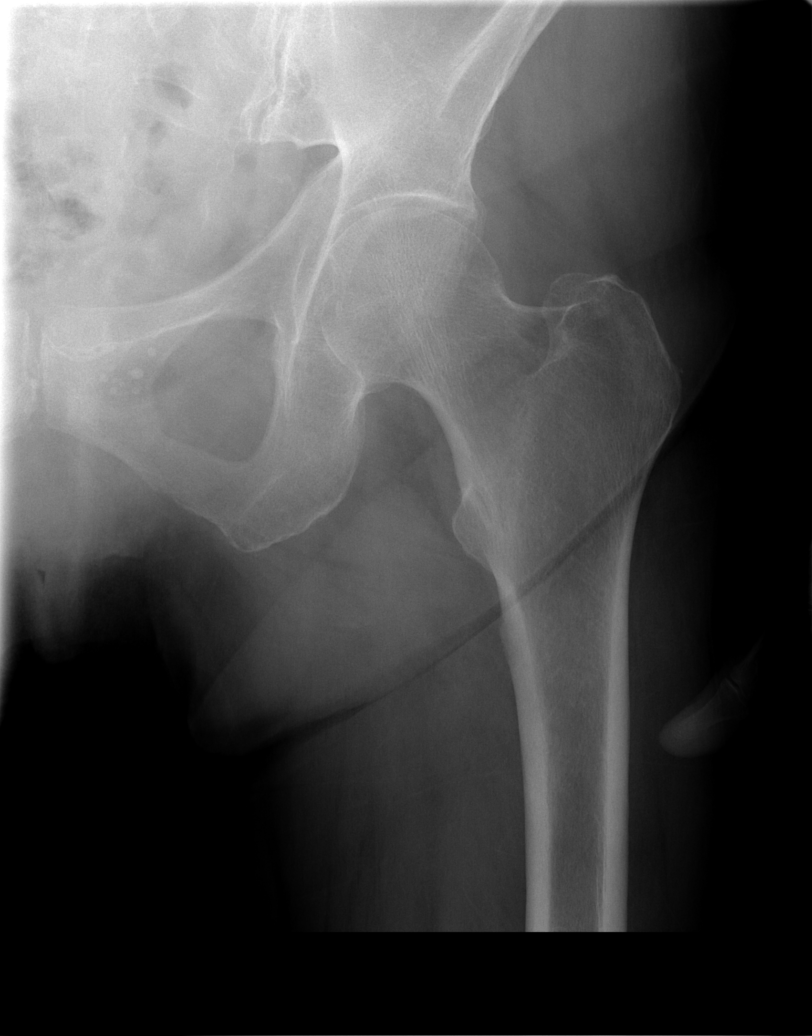

[t hip frog leg left]
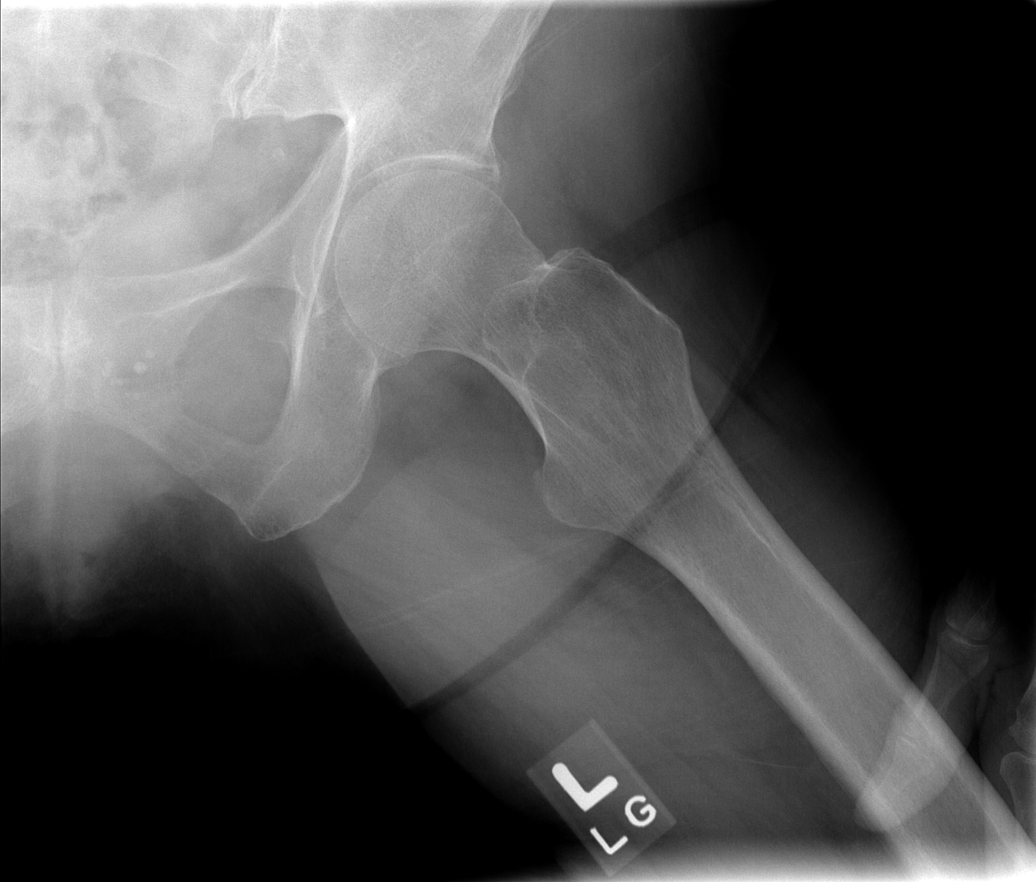

[t hip ap right]
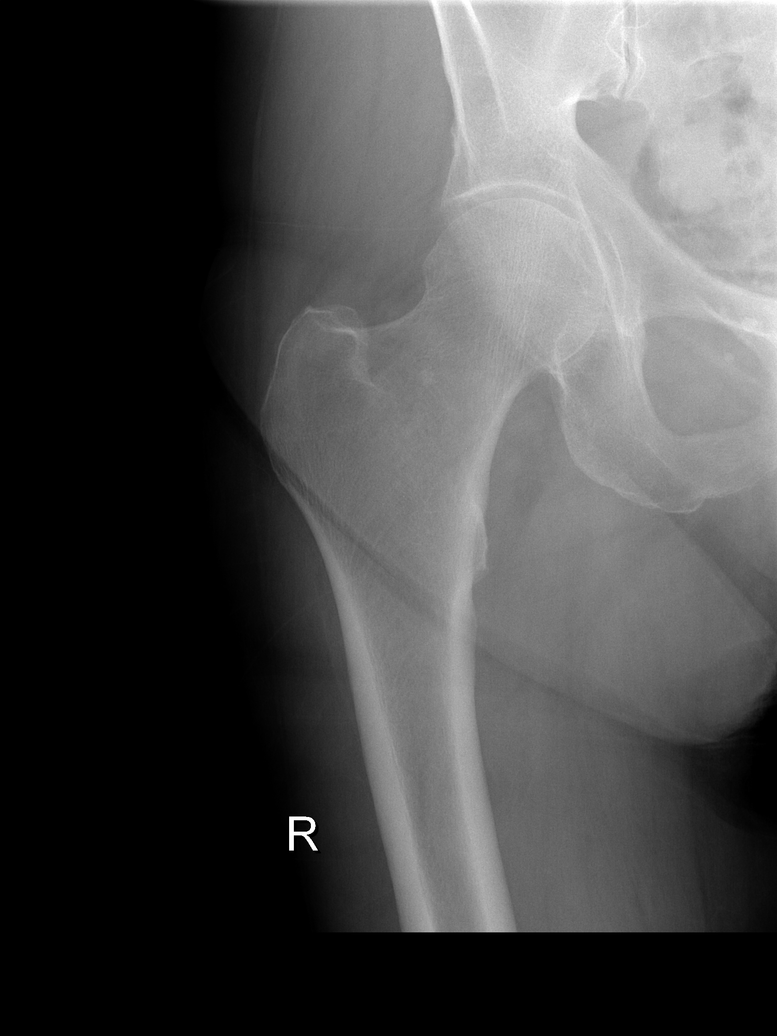

[t hip frog leg right]
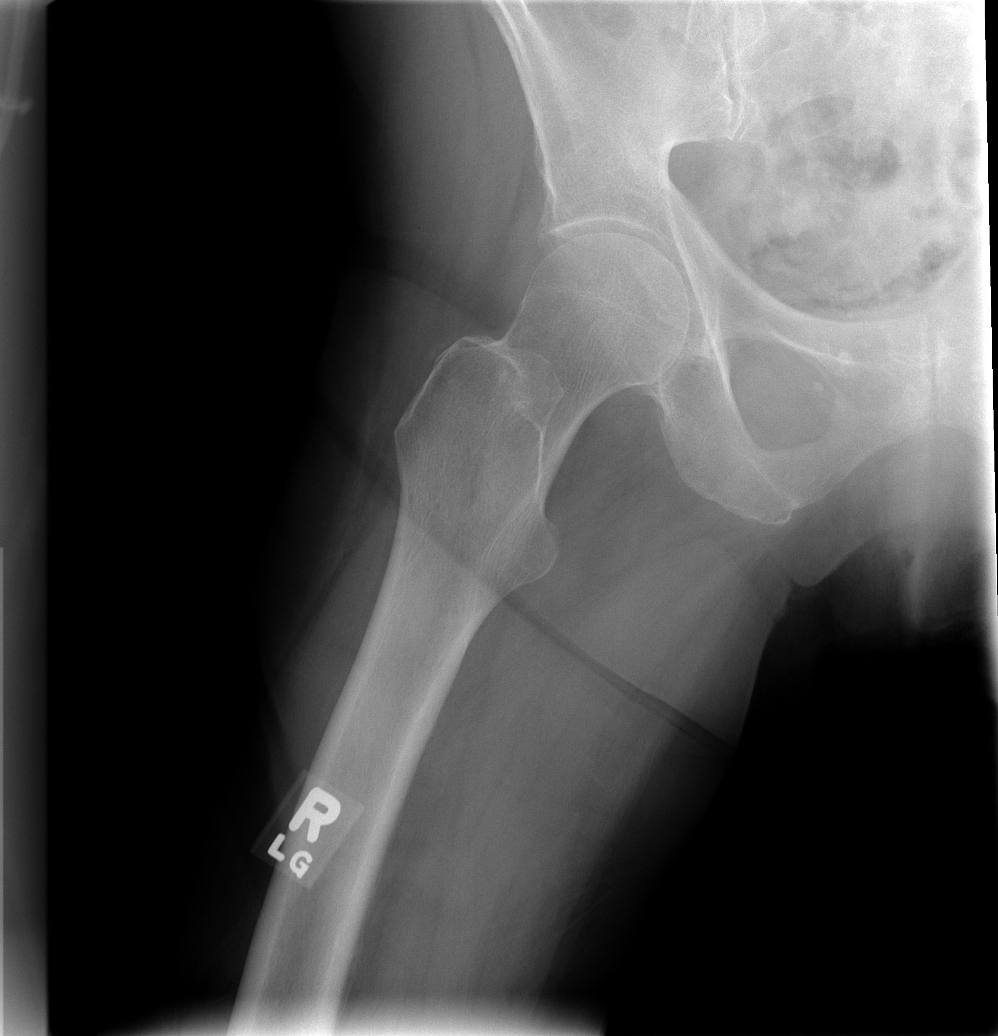

[t pelvis a.p.]
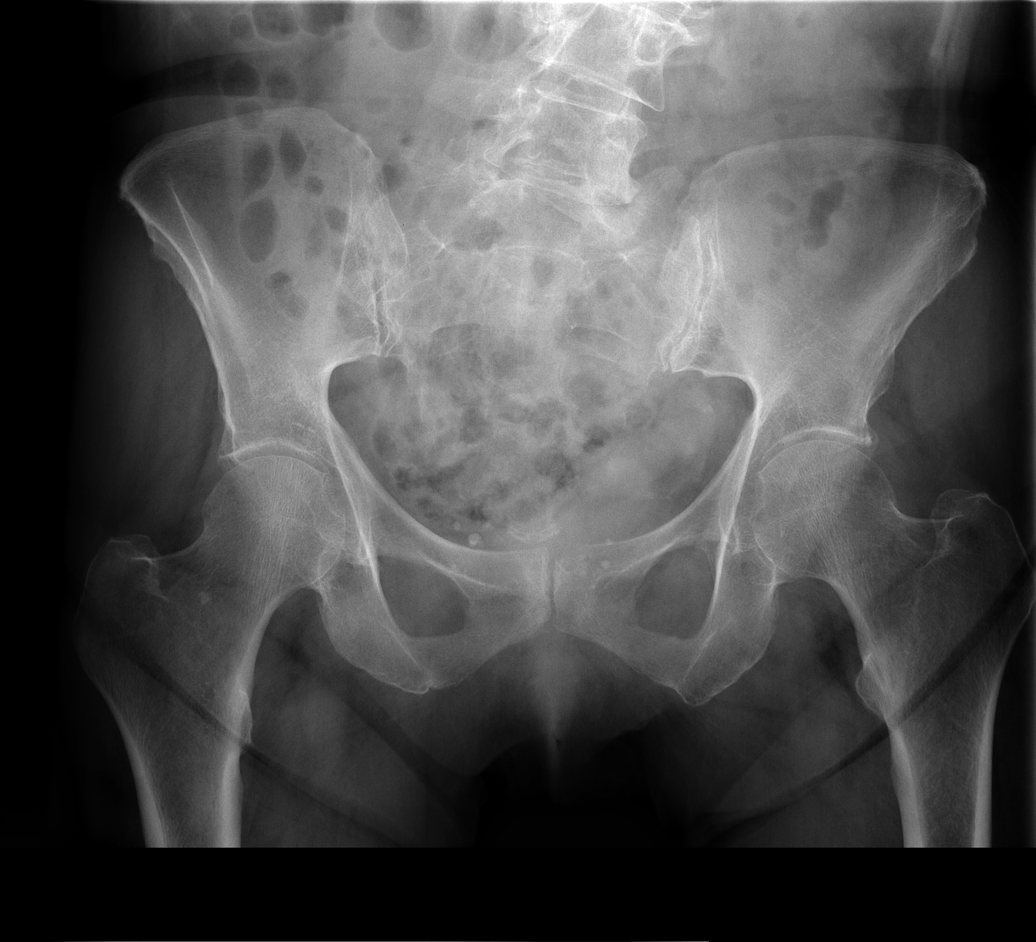

[5 of 5 positions shown; findings below may reference images not displayed]

FINDINGS: There are minimal hip joint degenerative changes for the patient?s age.  No destructive bony changes.  No plain film evidence for avascular necrosis.  The pubic symphysis and SI joints are intact.  Significant scoliotic curvature is noted in the lower lumbar spine.
IMPRESSION: No acute bony findings or significant degenerative changes.

## 2007-12-07 ENCOUNTER — Encounter: Payer: Self-pay | Admitting: Family Medicine

## 2007-12-08 IMAGING — CR DG HIP COMPLETE 2+V*R*
3 series · 3 of 3 positions shown · non-contrast
Comparison: none

04/13/07 - DUPLICATE COPY for exam association in [facility] – No change from original report.
HISTORY: Leg and hip pain, fell out of chair, right shoulder pain

 RIGHT HIP 2 VIEWS:
 Diffuse bony demineralization.
 Hip joint spaces preserved.
 Pelvis appears intact.
 Small bone island right femoral neck.
 No fracture, dislocation, or bone destruction.

[t pelvis a.p.]
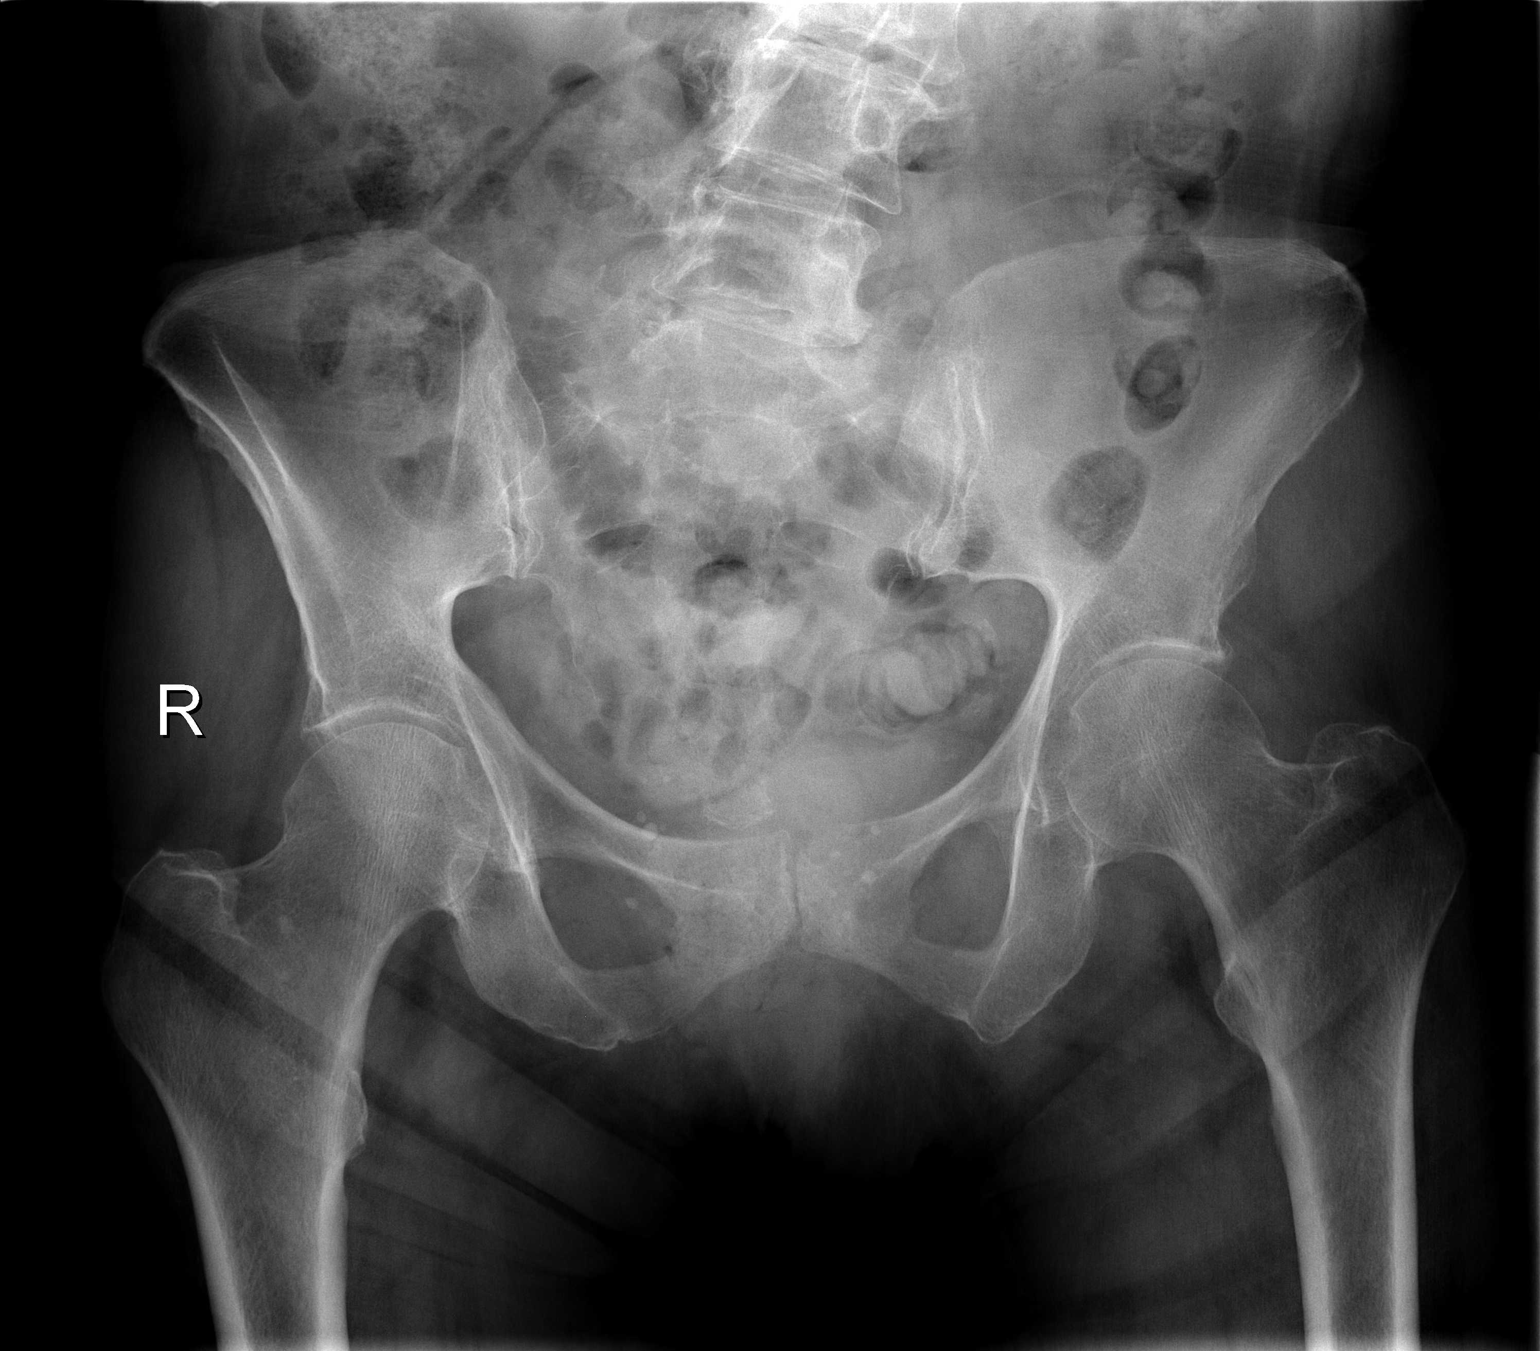

[t hip ap right]
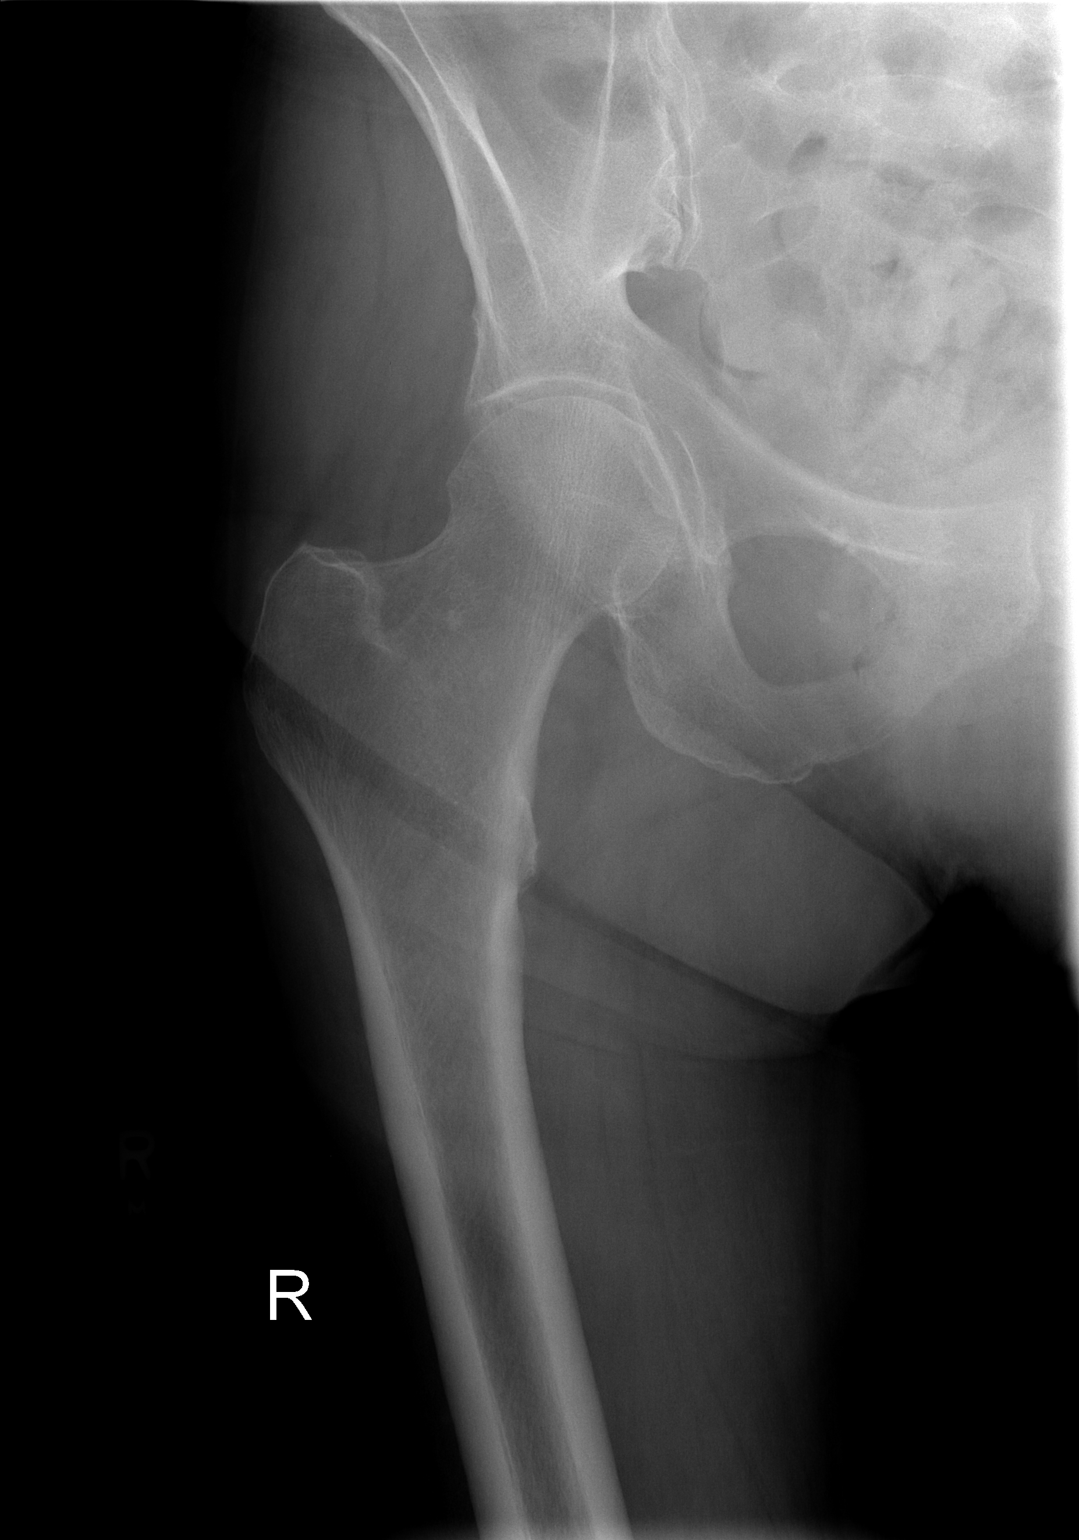

[t hip frog leg right]
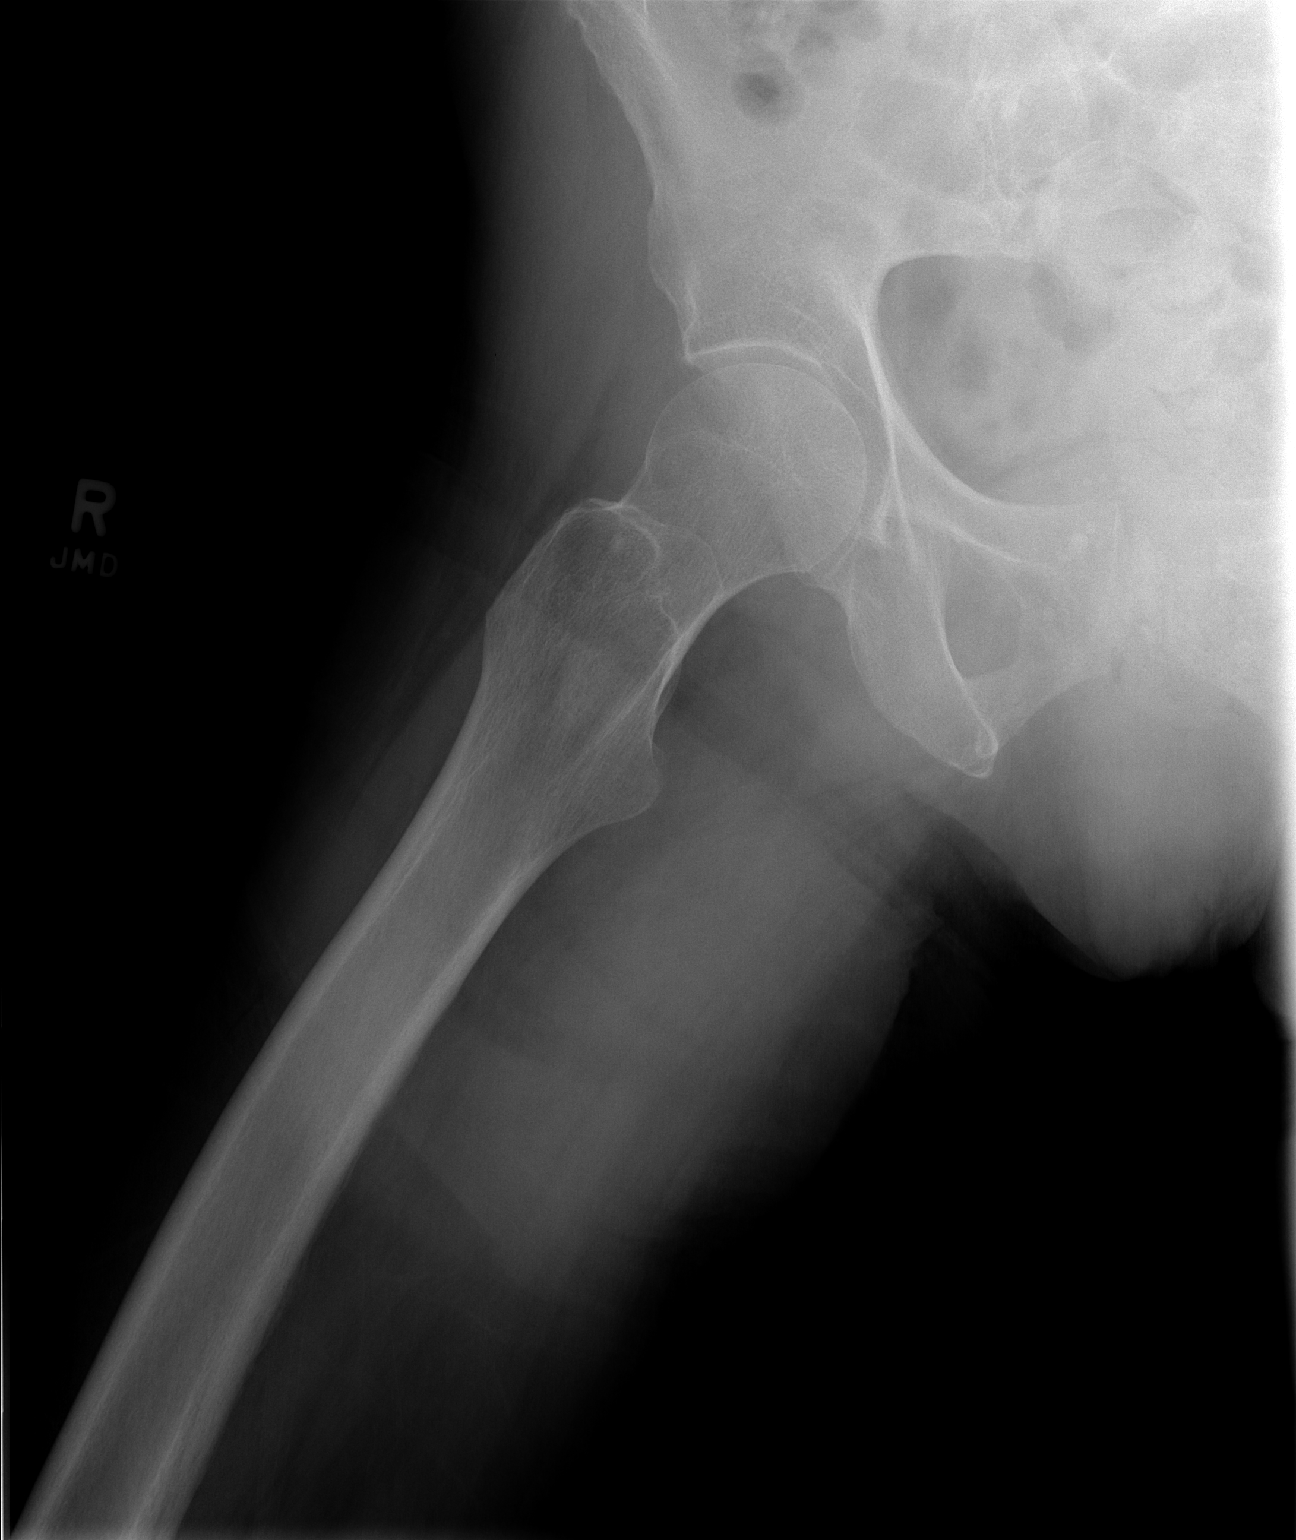

[3 of 3 positions shown; findings below may reference images not displayed]

IMPRESSION: No acute bony abnormality.

 RIGHT FOOT 3 VIEWS:

 Diffuse osteoporosis.
 No fracture, dislocation, or bone destruction.
 Joint spaces preserved.
IMPRESSION: Osteoporosis.
 No acute bony abnormalities.

 RIGHT SHOULDER 3 VIEWS:

 AC joint alignment normal.
 Small inferior clavicular spur at AC joint.
 No glenohumeral fracture or dislocation.
 Adjacent ribs intact.
 Diffuse osteoporosis.
 Questionable nodular density in lateral upper right chest on [DATE] AP films is
 not evident on the remaining films and is felt to be an artifact.
IMPRESSION: Osteoporosis.
 No acute bony abnormalities.

## 2007-12-08 IMAGING — CR DG SHOULDER 2+V*R*
3 series · 3 of 3 positions shown · non-contrast
Comparison: none

04/13/07 - DUPLICATE COPY for exam association in [facility] – No change from original report.
HISTORY: Leg and hip pain, fell out of chair, right shoulder pain

 RIGHT HIP 2 VIEWS:
 Diffuse bony demineralization.
 Hip joint spaces preserved.
 Pelvis appears intact.
 Small bone island right femoral neck.
 No fracture, dislocation, or bone destruction.

[t shoulder ap internal right]
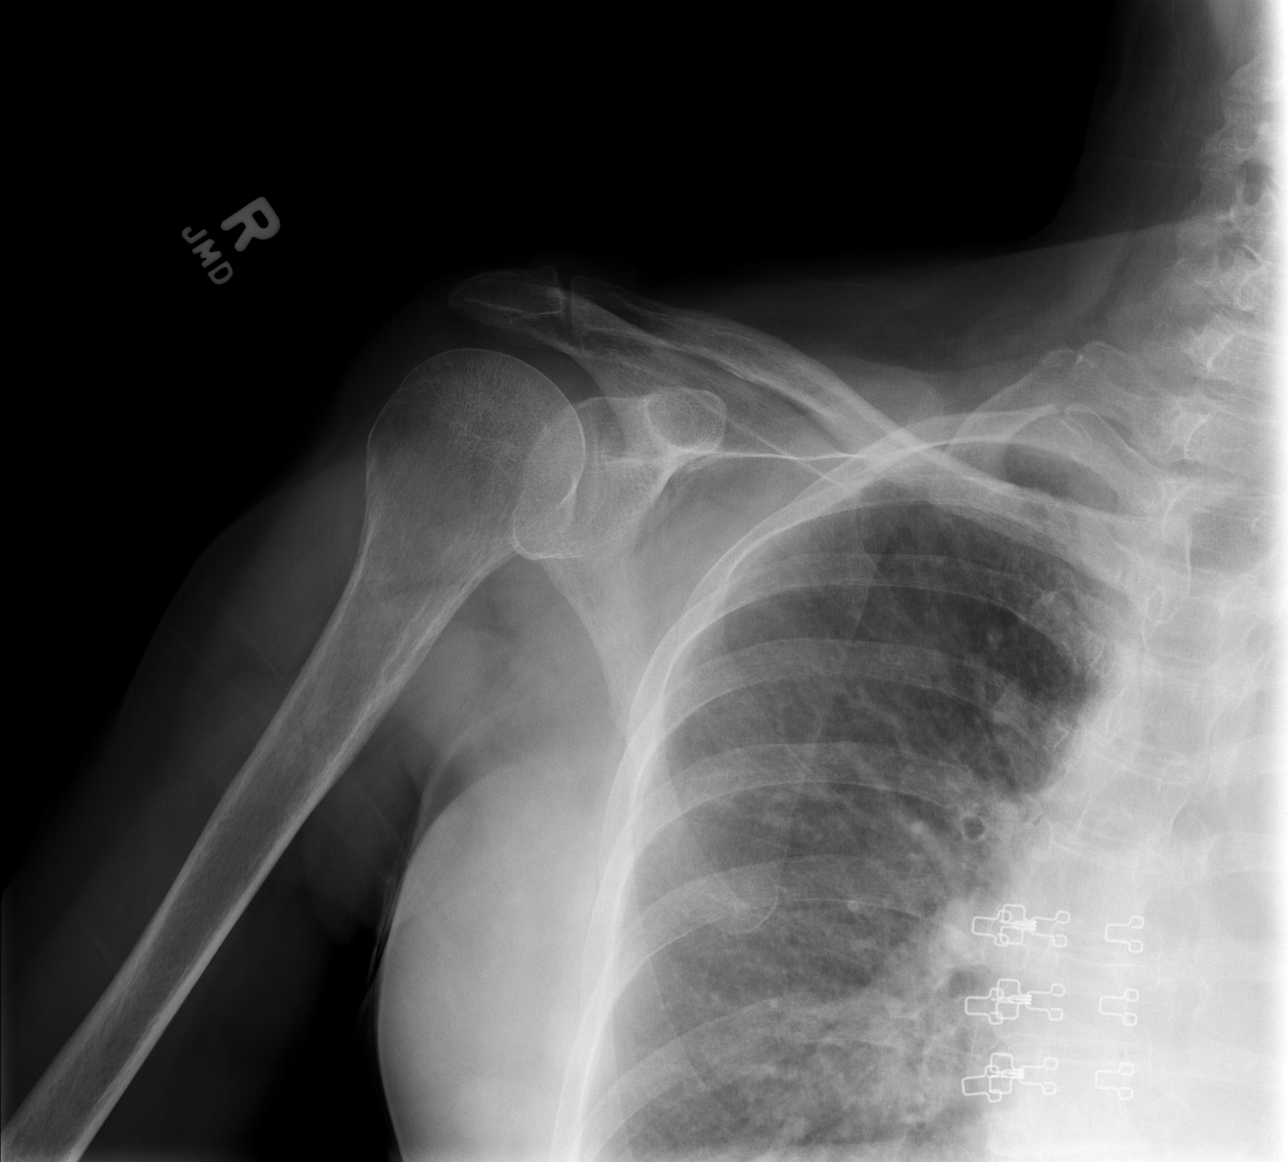

[t shoulder ap external righ]
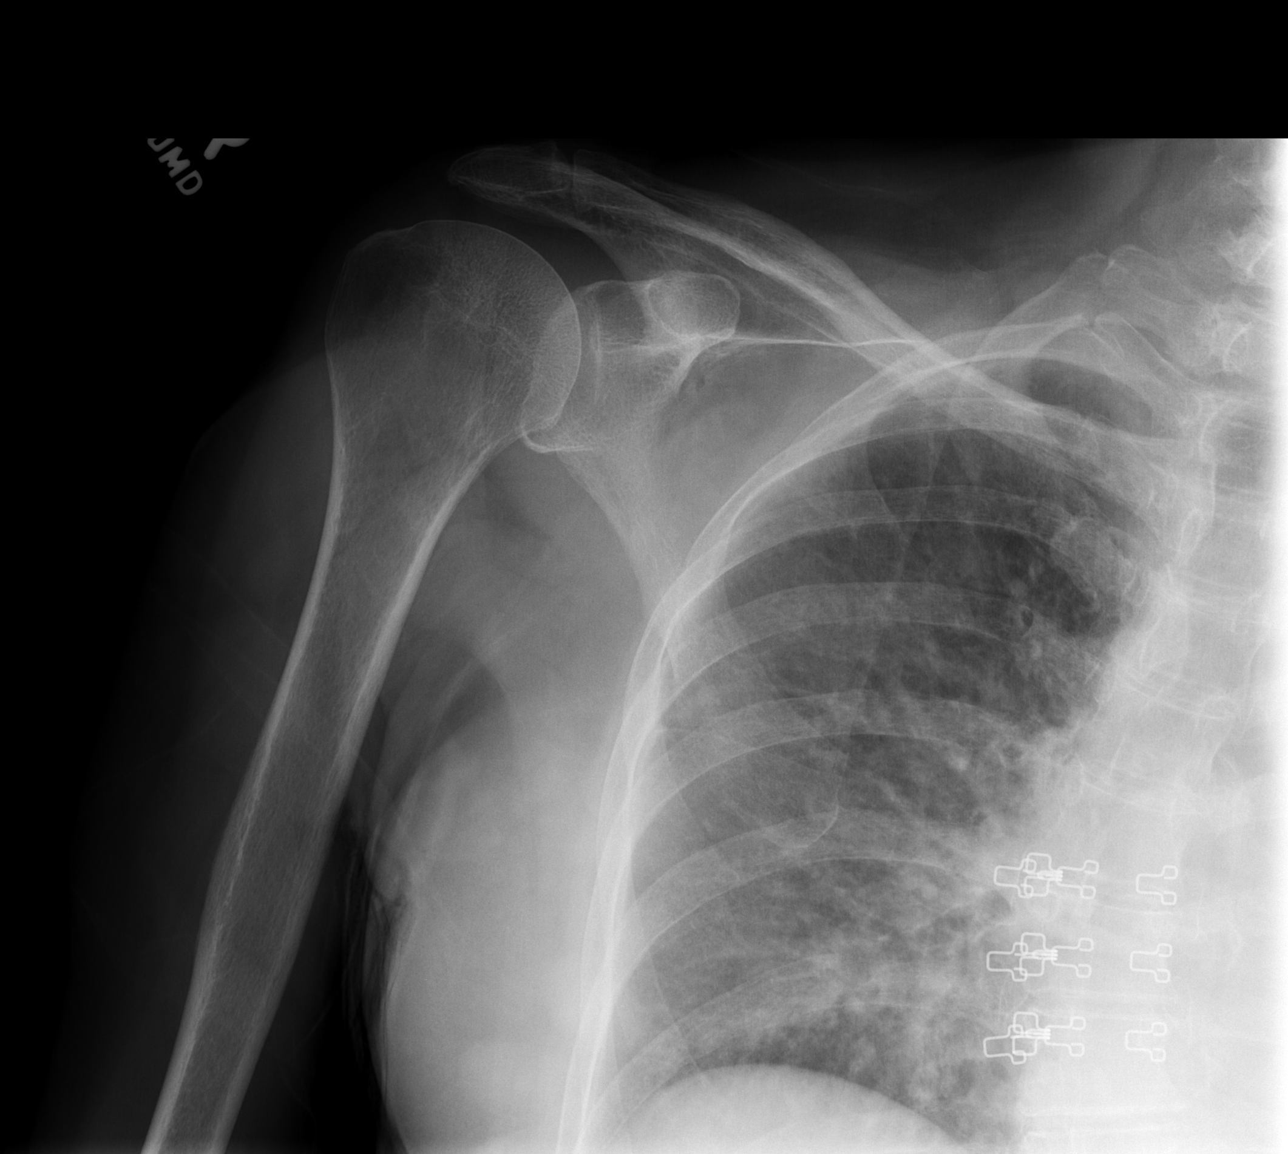

[t shoulder y view right]
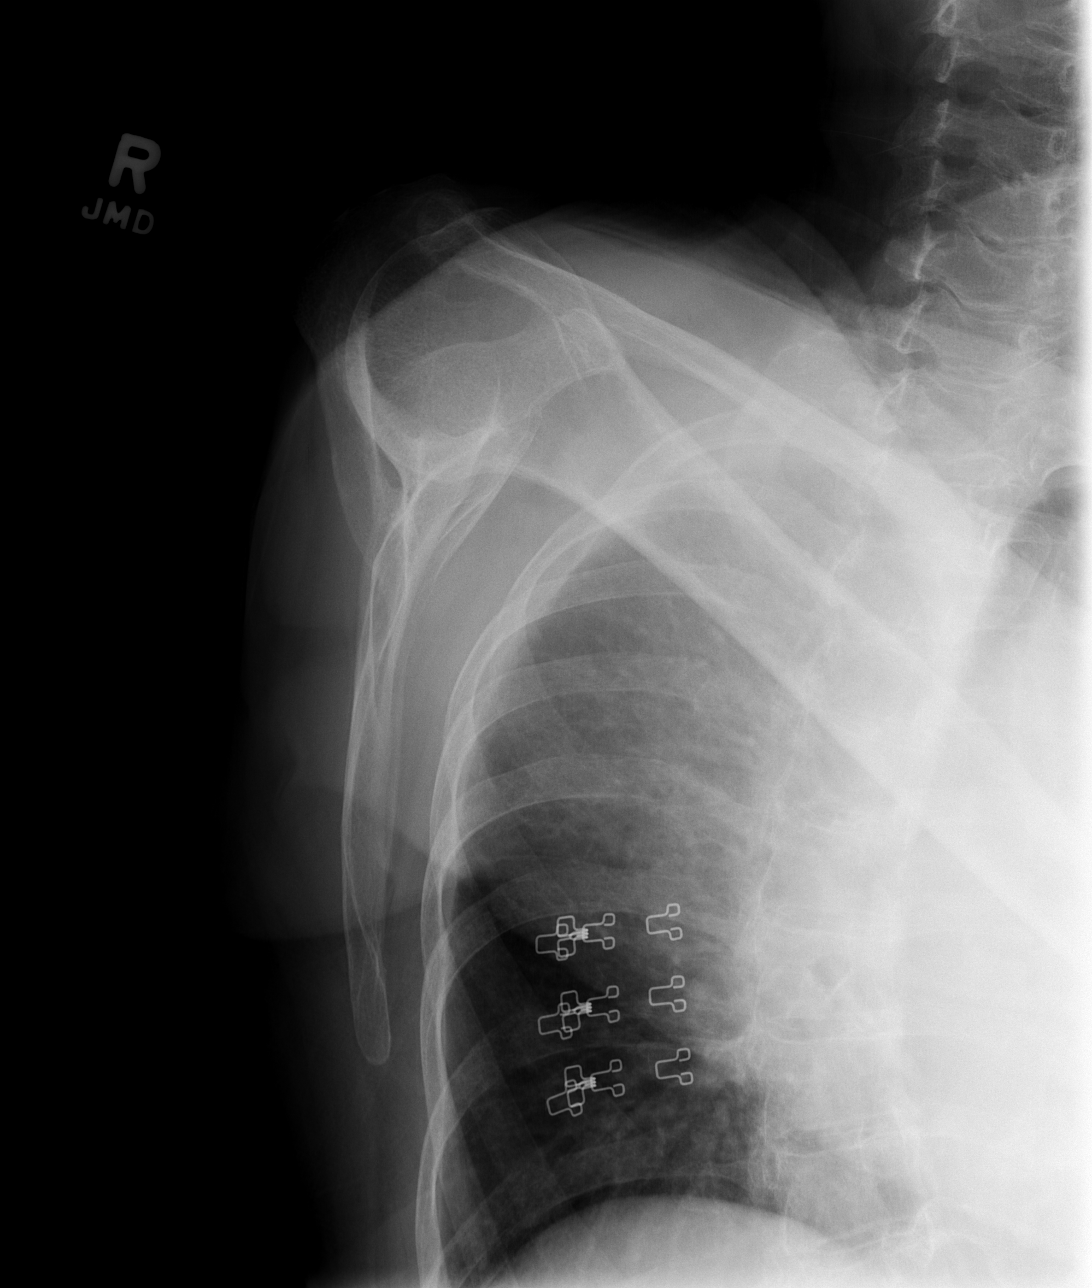

[3 of 3 positions shown; findings below may reference images not displayed]

IMPRESSION: No acute bony abnormality.

 RIGHT FOOT 3 VIEWS:

 Diffuse osteoporosis.
 No fracture, dislocation, or bone destruction.
 Joint spaces preserved.
IMPRESSION: Osteoporosis.
 No acute bony abnormalities.

 RIGHT SHOULDER 3 VIEWS:

 AC joint alignment normal.
 Small inferior clavicular spur at AC joint.
 No glenohumeral fracture or dislocation.
 Adjacent ribs intact.
 Diffuse osteoporosis.
 Questionable nodular density in lateral upper right chest on [DATE] AP films is
 not evident on the remaining films and is felt to be an artifact.
IMPRESSION: Osteoporosis.
 No acute bony abnormalities.

## 2007-12-08 IMAGING — CT CT HEAD W/O CM
3 series · 17 of 40 positions shown, 19 images · non-contrast
Comparison: none

CLINICAL DATA: Fell

[Series 2: head_seq 4.5 h45s st · axial · 0.43mm/px · z∈[+1251,+1350]mm · 6 of 32 slices shown, 8 images]
[im 5/32  brain]
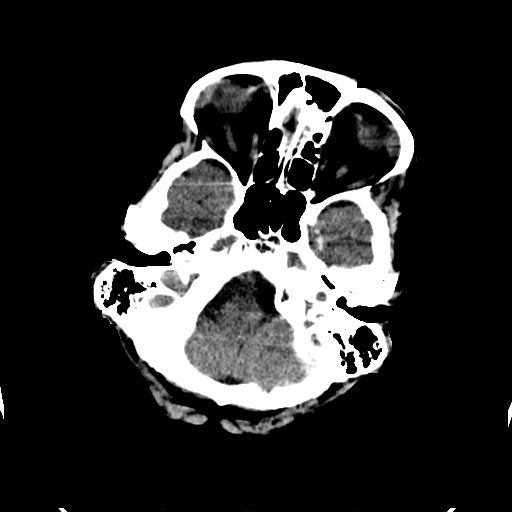
[im 5/32  bone]
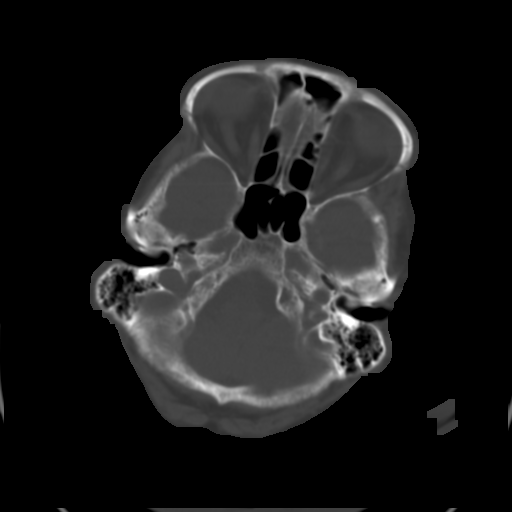
[im 9/32  brain]
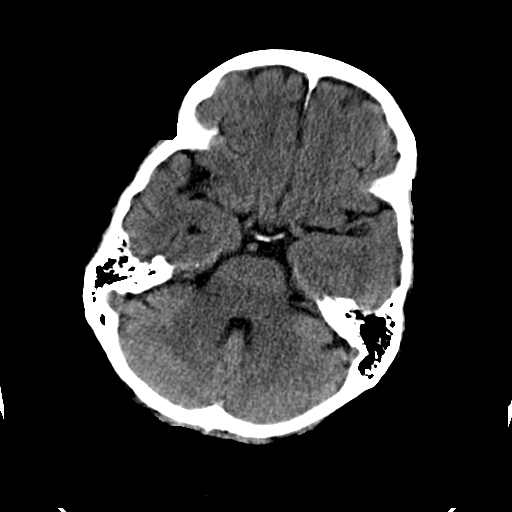
[im 14/32  brain]
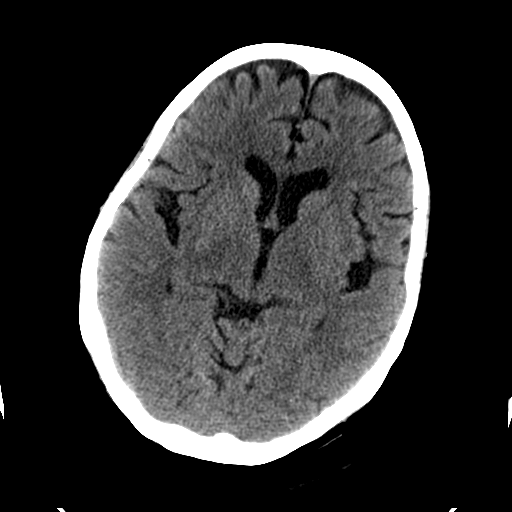
[im 18/32  brain]
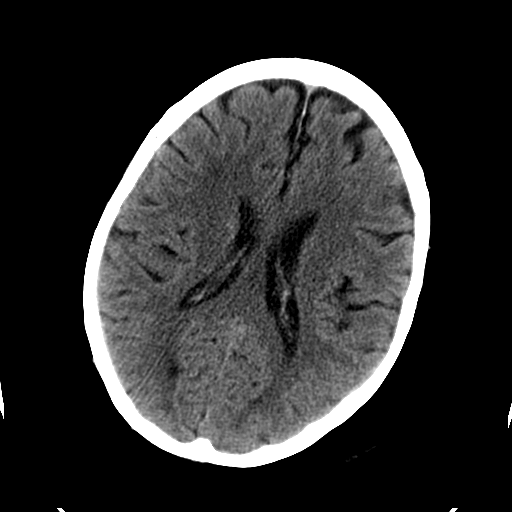
[im 23/32  brain]
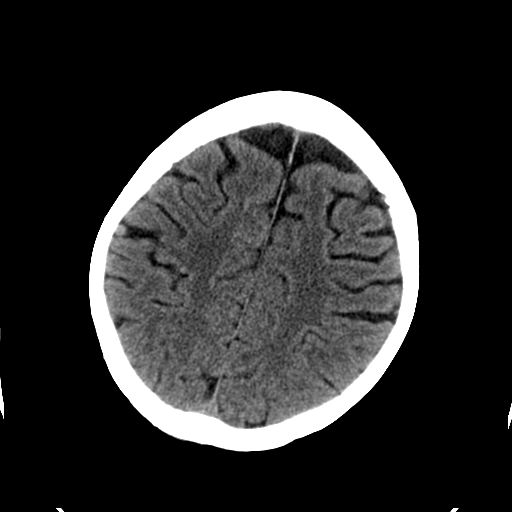
[im 23/32  bone]
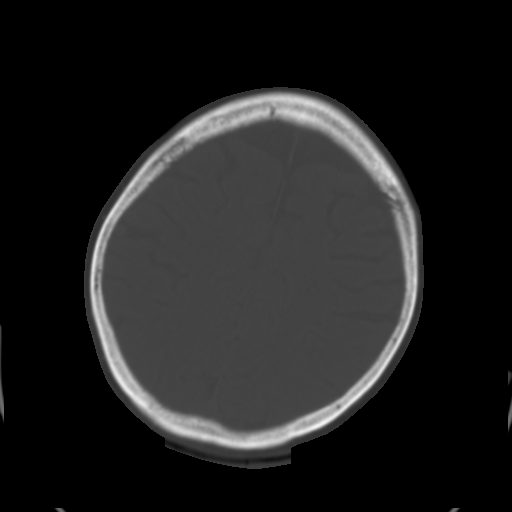
[im 27/32  brain]
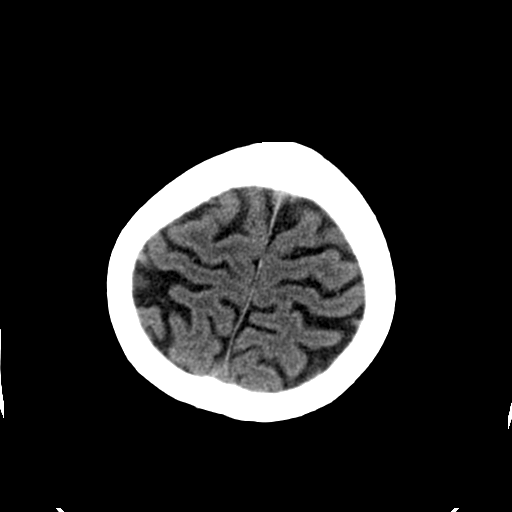

[Series 5: c_spine 2.0 b31s · axial · 0.23mm/px · z∈[+1114,+1244]mm · 8 of 81 slices shown]
[im 8/81  brain]
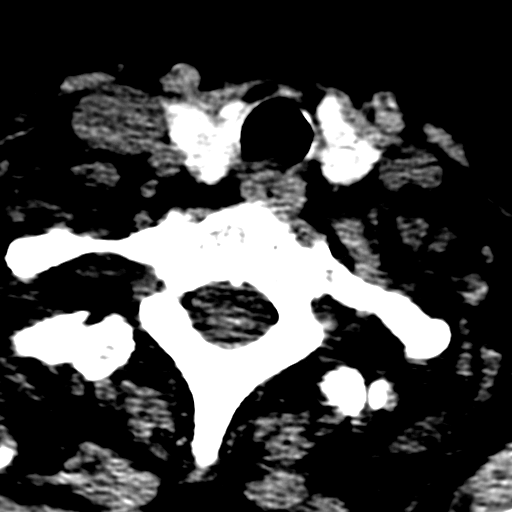
[im 16/81  brain]
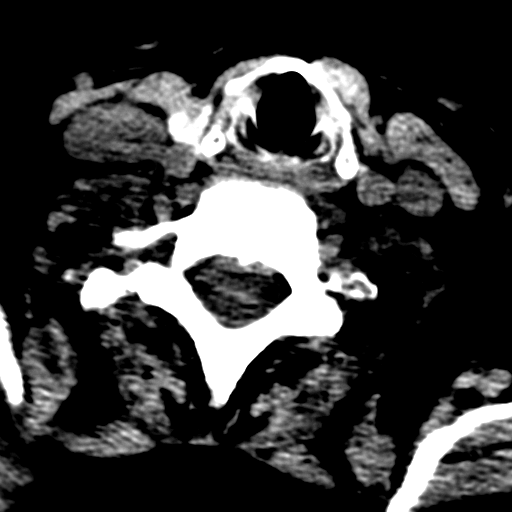
[im 27/81  brain]
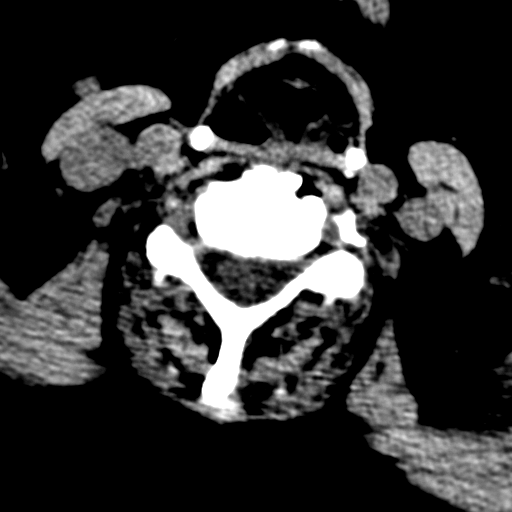
[im 35/81  brain]
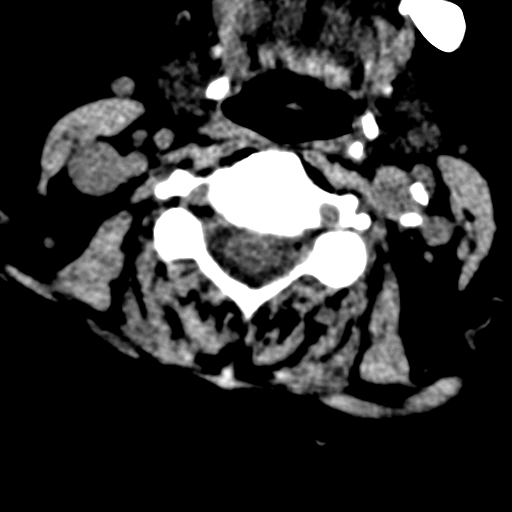
[im 46/81  brain]
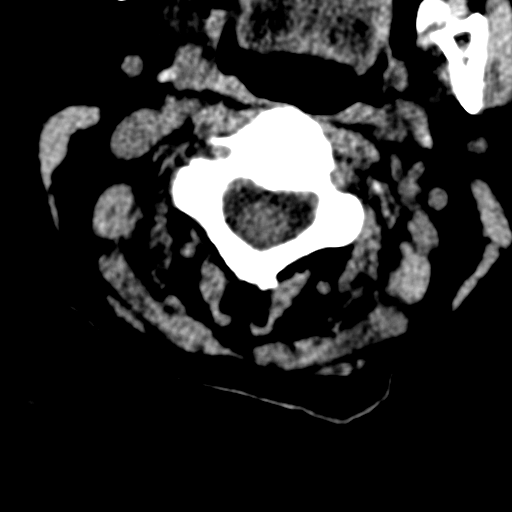
[im 54/81  brain]
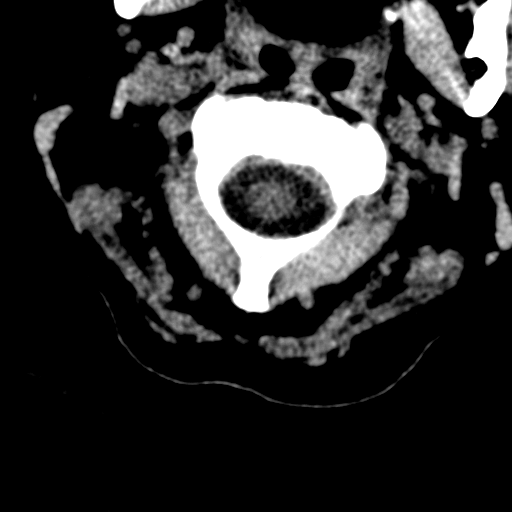
[im 65/81  brain]
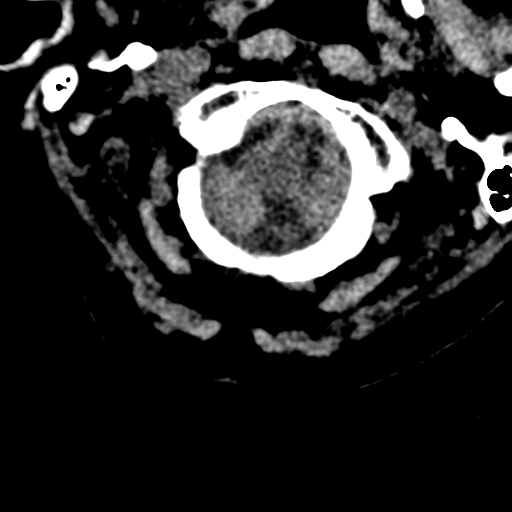
[im 73/81  brain]
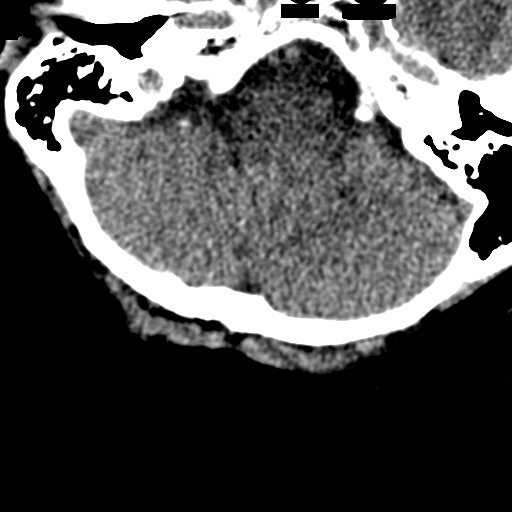

[Series 602: <mpr range> · coronal · 0.31mm/px · 3 of 33 slices shown]
[im 11/33  brain]
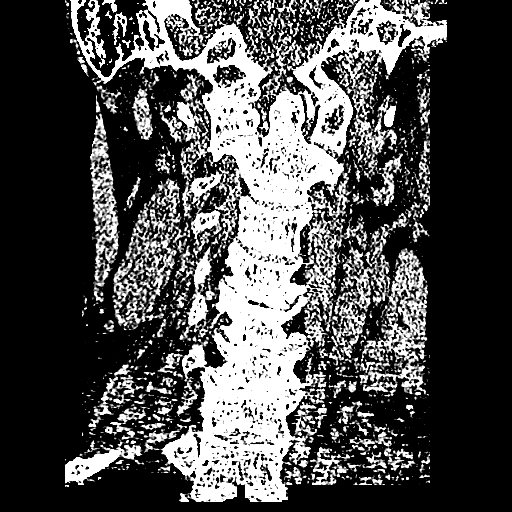
[im 15/33  brain]
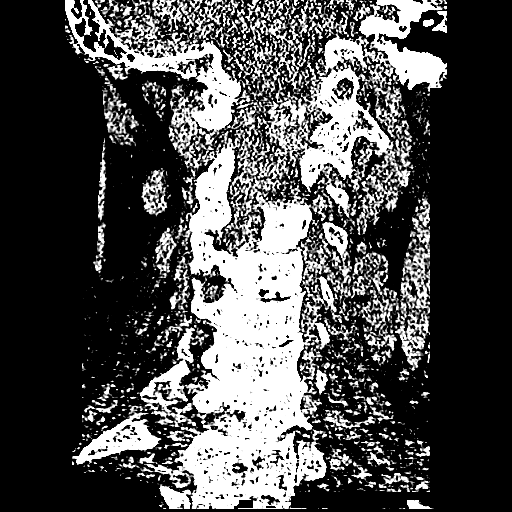
[im 18/33  brain]
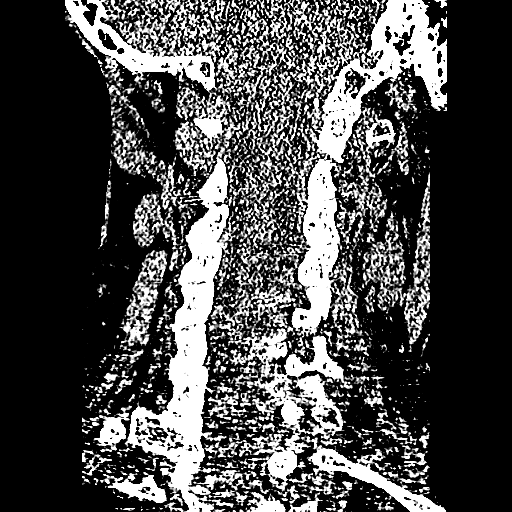

[17 of 40 positions shown; findings below may reference images not displayed]

CT head without contrast:

Comparison 06/18/2005.  There is no evidence of acute intracranial hemorrhage,
brain edema, mass,  mass effect, or midline shift. Acute infarct may be
inapparent on noncontrast CT.  No other intra-axial abnormalities are seen, and
the ventricles and sulci are within normal limits in size and symmetry.   No
abnormal extra-axial fluid collections or masses are identified.  No significant
calvarial abnormality.
IMPRESSION: 1. Negative non-contrast head CT.

CT cervical spine without contrast:

Multidetector axial helical CT with coronal and sagittal reconstructions.
Negative for fracture. Narrowing of interspaces C4-C7 with anterior and
posterior spurring. Straightening of the normal lordosis. No prevertebral soft
tissue swelling.   Bilateral carotid bifurcation calcified plaque is noted.
IMPRESSION: 1. Negative for fracture or other acute bone injury.
2. Degenerative disc disease with posterior spurring C4-C7.
3. Bilateral carotid bifurcation plaque.

## 2007-12-16 ENCOUNTER — Ambulatory Visit: Payer: Self-pay | Admitting: Family Medicine

## 2007-12-16 DIAGNOSIS — F341 Dysthymic disorder: Secondary | ICD-10-CM | POA: Insufficient documentation

## 2007-12-16 LAB — CONVERTED CEMR LAB
Blood in Urine, dipstick: NEGATIVE
Ketones, urine, test strip: NEGATIVE
Nitrite: NEGATIVE
Urobilinogen, UA: 0.2
pH: 5

## 2008-01-04 IMAGING — CT CT PELVIS W/ CM
2 of 5 series · 16 of 46 positions shown, 18 images · IV contrast (READICAT/WATER & [ID] OMNI 300)
Comparison: None.

CLINICAL DATA: Right lower quadrant abdominal and groin pain. 
ABDOMEN CT WITH CONTRAST:
TECHNIQUE: Multidetector CT imaging of the abdomen was performed following the standard protocol during bolus administration of intravenous contrast.
Contrast:   cc Omnipaque 300
TECHNIQUE: Multidetector CT imaging of the pelvis was performed following the standard protocol during bolus administration of intravenous contrast.

[Series 3: routine abdomen · axial · 0.70mm/px · z∈[-320,+56]mm · 13 of 85 slices shown, 15 images]
[im 5/85  soft-tissue]
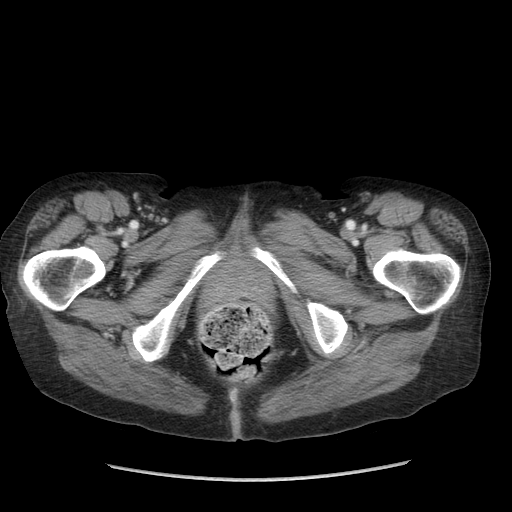
[im 5/85  bone]
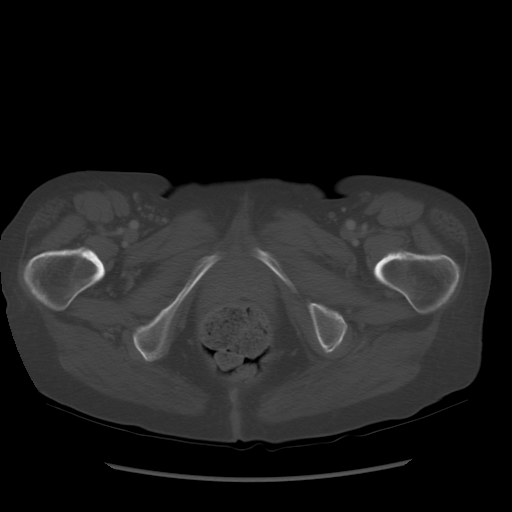
[im 14/85  soft-tissue]
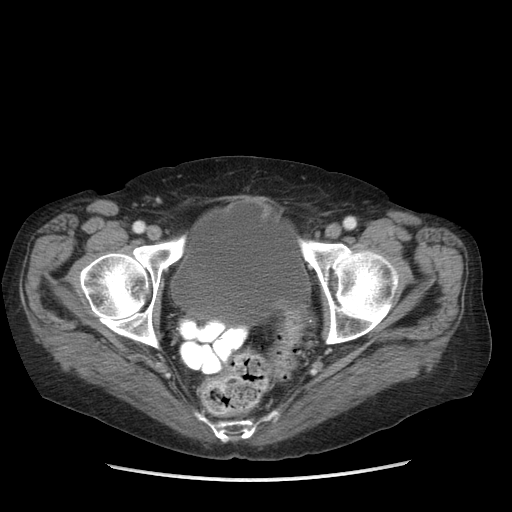
[im 18/85  soft-tissue]
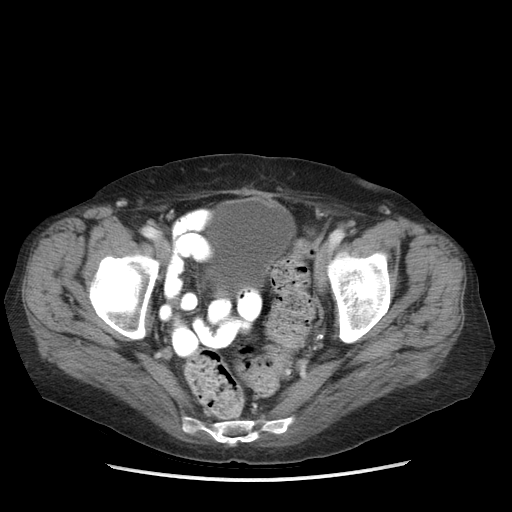
[im 23/85  soft-tissue]
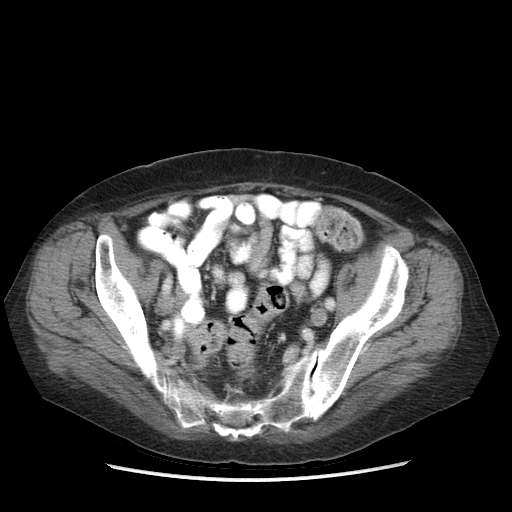
[im 31/85  soft-tissue]
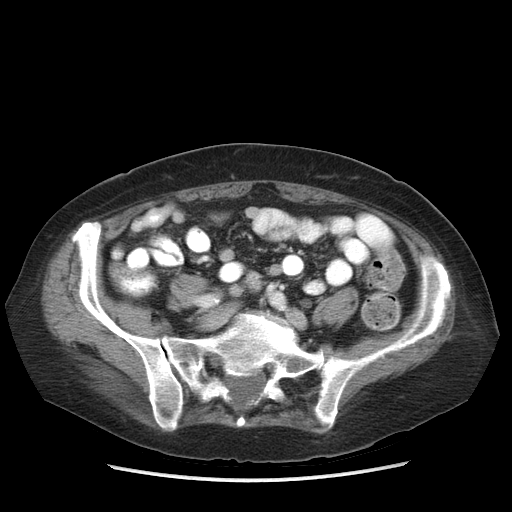
[im 36/85  soft-tissue]
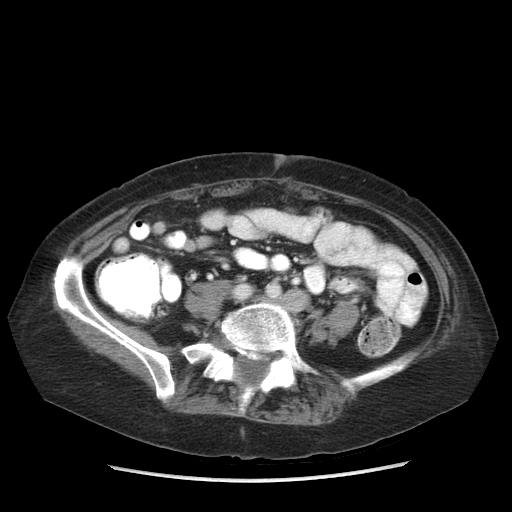
[im 45/85  soft-tissue]
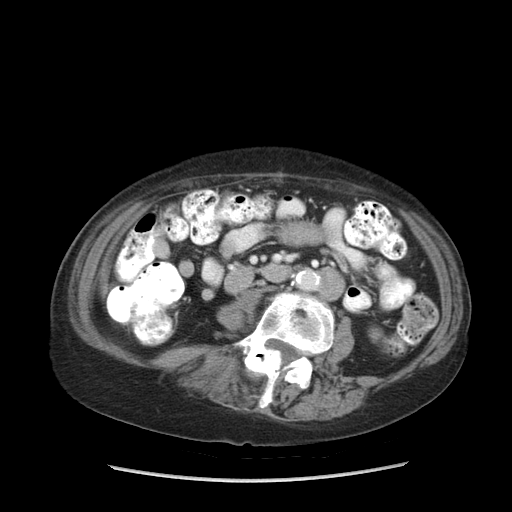
[im 49/85  soft-tissue]
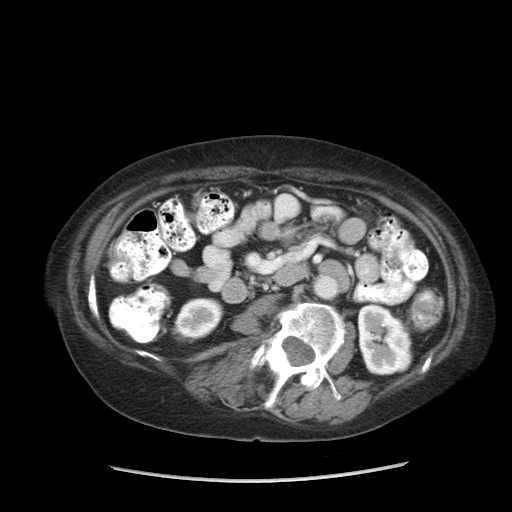
[im 54/85  soft-tissue]
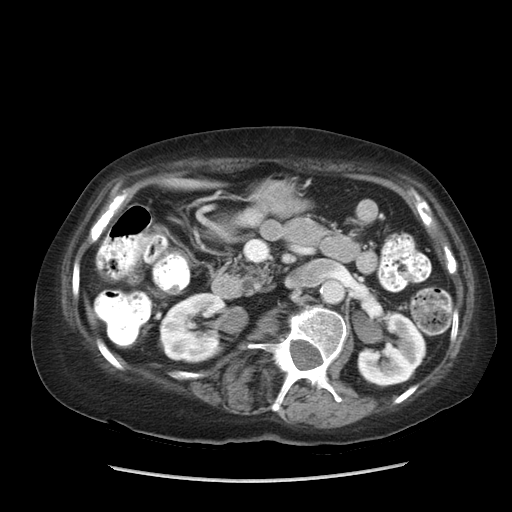
[im 54/85  bone]
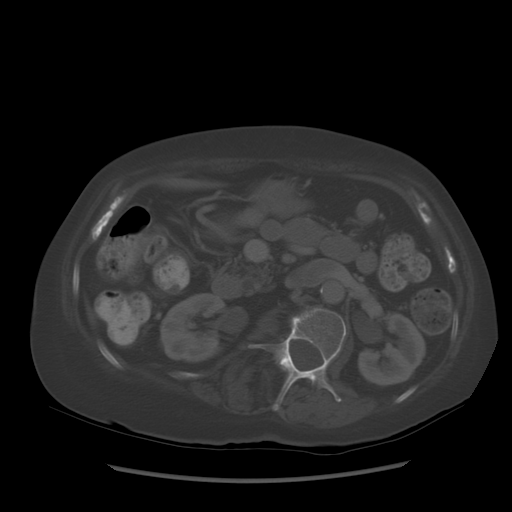
[im 62/85  soft-tissue]
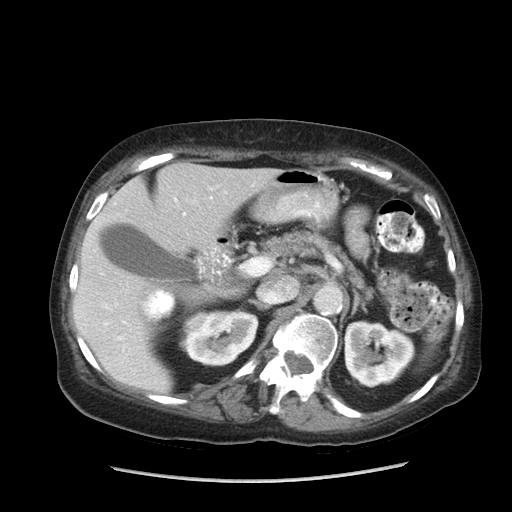
[im 67/85  soft-tissue]
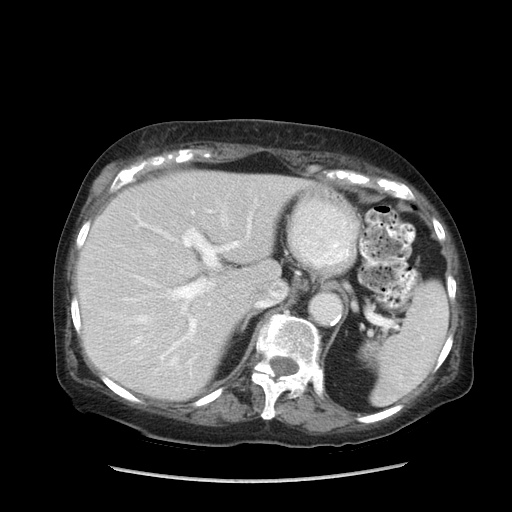
[im 71/85  soft-tissue]
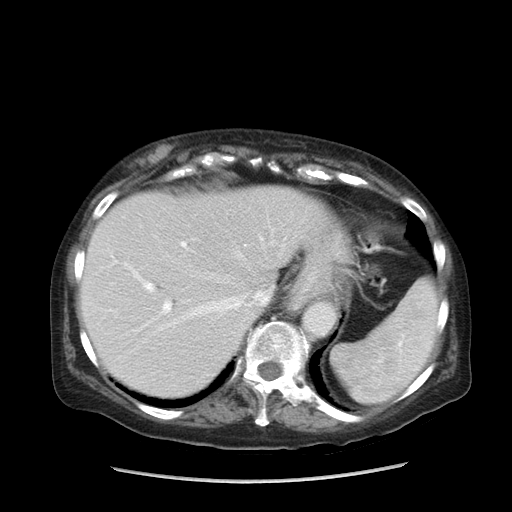
[im 80/85  soft-tissue]
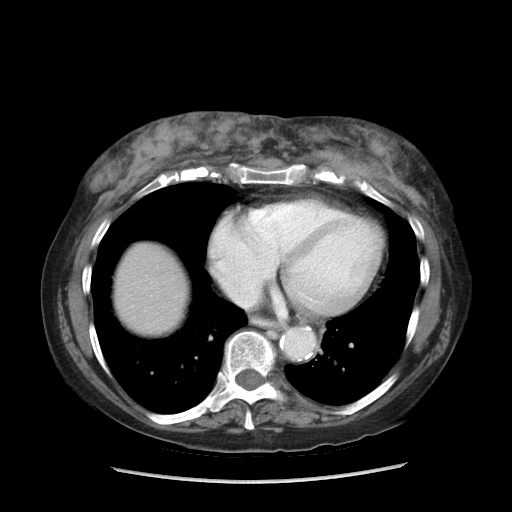

[Series 602: sagittal body · sagittal · 0.82mm/px · 3 of 142 slices shown]
[im 48/142  soft-tissue]
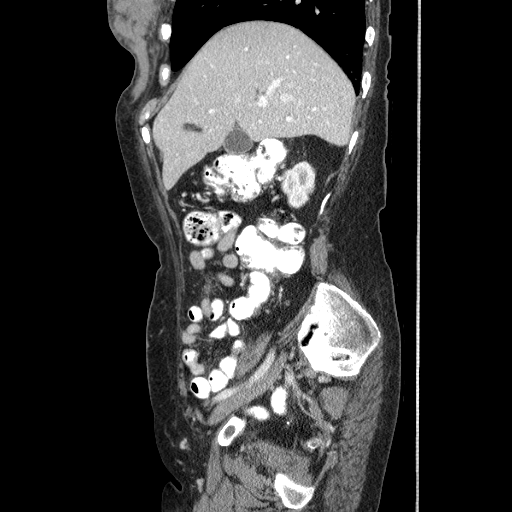
[im 63/142  soft-tissue]
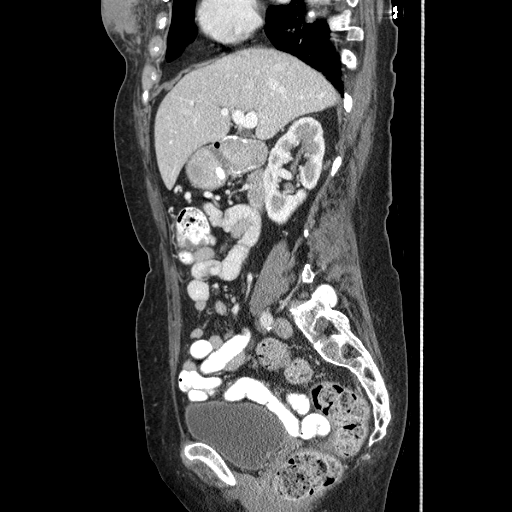
[im 79/142  soft-tissue]
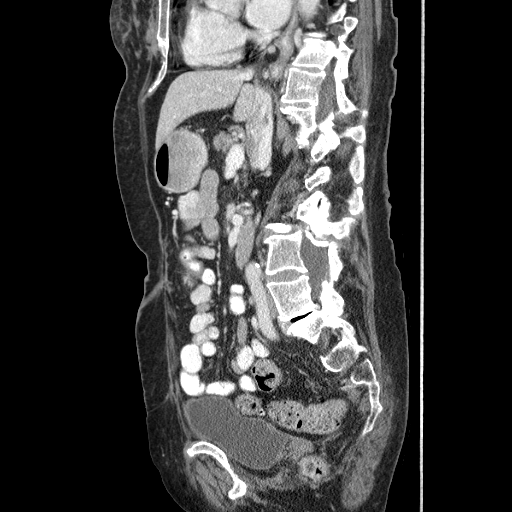

[16 of 46 positions shown; findings below may reference images not displayed]

FINDINGS: There is an extremely nodular appearance of the breasts bilaterally.  This is apparently due to silicone granulomas based on prior MRI.  The lung bases are relatively clear.  There are some scarring changes and dependent atelectasis. 
The liver demonstrates no significant findings.  Mild fatty change in the falciform ligament.  The gallbladder appears normal.  
The spleen is normal in size.  The pancreas is mildly atrophic but no masses or inflammation.  The common bile duct is within normal limits in caliber.  The adrenal glands and kidneys demonstrate no significant findings.  Mild fetal lobulation appearance of both kidneys.  
The stomach, duodenum, small bowel, and colon are unremarkable.  The patient has a small hiatal hernia.  There is also mild constipation.  There is a severe scoliotic curvature of the lumbar spine with associated disc disease and facet disease. 
The aorta is normal in caliber.  No dissection.
IMPRESSION: 1.  No acute abdominal findings, mass lesions, or adenopathy.
2.  Significant scoliotic curvature of the lumbar spine. 
3.  Numerous silicone granulomas involving the breasts. 
PELVIS CT WITH CONTRAST:
FINDINGS: There is a large amount of stool in the rectum and sigmoid colon suggesting constipation.  No inflammation or masses.  Patient has had a hysterectomy.  I believe I can identify both ovaries and they appear normal.  Small bowel loops appear normal.  Bladder is unremarkable.  No pelvic masses or adenopathy.  
No significant bony findings.
IMPRESSION: 1.  No acute pelvic findings, masses, or adenopathy.  
2.  Constipation.
3.  Status-post hysterectomy.

## 2008-01-20 ENCOUNTER — Ambulatory Visit: Payer: Self-pay | Admitting: Family Medicine

## 2008-01-20 DIAGNOSIS — G47419 Narcolepsy without cataplexy: Secondary | ICD-10-CM | POA: Insufficient documentation

## 2008-01-20 LAB — CONVERTED CEMR LAB
Bilirubin Urine: NEGATIVE
Nitrite: NEGATIVE
Protein, U semiquant: NEGATIVE
Urobilinogen, UA: 0.2
WBC Urine, dipstick: NEGATIVE

## 2008-02-02 ENCOUNTER — Telehealth: Payer: Self-pay | Admitting: Family Medicine

## 2008-03-07 ENCOUNTER — Encounter: Payer: Self-pay | Admitting: Family Medicine

## 2008-03-22 ENCOUNTER — Telehealth: Payer: Self-pay | Admitting: Family Medicine

## 2008-04-27 ENCOUNTER — Telehealth: Payer: Self-pay | Admitting: Family Medicine

## 2008-05-10 ENCOUNTER — Ambulatory Visit: Payer: Self-pay | Admitting: Family Medicine

## 2008-05-10 ENCOUNTER — Telehealth: Payer: Self-pay | Admitting: Family Medicine

## 2008-05-25 ENCOUNTER — Telehealth: Payer: Self-pay | Admitting: Family Medicine

## 2008-06-01 ENCOUNTER — Encounter: Payer: Self-pay | Admitting: Family Medicine

## 2008-06-01 LAB — CONVERTED CEMR LAB: Pap Smear: NEGATIVE

## 2008-06-02 ENCOUNTER — Telehealth: Payer: Self-pay | Admitting: Family Medicine

## 2008-06-06 ENCOUNTER — Encounter: Payer: Self-pay | Admitting: Family Medicine

## 2008-06-29 ENCOUNTER — Telehealth: Payer: Self-pay | Admitting: Family Medicine

## 2008-07-21 ENCOUNTER — Telehealth: Payer: Self-pay | Admitting: Family Medicine

## 2008-07-27 ENCOUNTER — Telehealth: Payer: Self-pay | Admitting: Family Medicine

## 2008-08-02 ENCOUNTER — Ambulatory Visit: Payer: Self-pay | Admitting: Family Medicine

## 2008-08-02 DIAGNOSIS — M545 Low back pain: Secondary | ICD-10-CM

## 2008-08-08 ENCOUNTER — Ambulatory Visit: Payer: Self-pay | Admitting: Family Medicine

## 2008-08-08 LAB — CONVERTED CEMR LAB
Bilirubin Urine: NEGATIVE
Blood in Urine, dipstick: NEGATIVE
Nitrite: NEGATIVE
Specific Gravity, Urine: 1.015
pH: 7

## 2008-08-16 ENCOUNTER — Telehealth: Payer: Self-pay | Admitting: *Deleted

## 2008-08-17 ENCOUNTER — Telehealth: Payer: Self-pay | Admitting: Family Medicine

## 2008-08-19 LAB — CONVERTED CEMR LAB
AST: 33 units/L (ref 0–37)
Alkaline Phosphatase: 92 units/L (ref 39–117)
Basophils Relative: 0.7 % (ref 0.0–3.0)
Bilirubin, Direct: 0.1 mg/dL (ref 0.0–0.3)
CO2: 34 meq/L — ABNORMAL HIGH (ref 19–32)
Chloride: 103 meq/L (ref 96–112)
Cholesterol: 170 mg/dL (ref 0–200)
Creatinine, Ser: 0.8 mg/dL (ref 0.4–1.2)
Eosinophils Relative: 4.6 % (ref 0.0–5.0)
GFR calc non Af Amer: 75 mL/min
Glucose, Bld: 107 mg/dL — ABNORMAL HIGH (ref 70–99)
LDL Cholesterol: 101 mg/dL — ABNORMAL HIGH (ref 0–99)
MCHC: 34.3 g/dL (ref 30.0–36.0)
MCV: 87.3 fL (ref 78.0–100.0)
Monocytes Absolute: 1 10*3/uL (ref 0.1–1.0)
Monocytes Relative: 10.6 % (ref 3.0–12.0)
Neutrophils Relative %: 58.3 % (ref 43.0–77.0)
Potassium: 4.3 meq/L (ref 3.5–5.1)
RBC: 4.37 M/uL (ref 3.87–5.11)
RDW: 13.1 % (ref 11.5–14.6)
TSH: 3.2 microintl units/mL (ref 0.35–5.50)
Total Protein: 6.4 g/dL (ref 6.0–8.3)
Triglycerides: 101 mg/dL (ref 0–149)
WBC: 9.2 10*3/uL (ref 4.5–10.5)

## 2008-08-24 ENCOUNTER — Telehealth: Payer: Self-pay | Admitting: Family Medicine

## 2008-09-13 ENCOUNTER — Encounter: Payer: Self-pay | Admitting: Family Medicine

## 2008-09-29 ENCOUNTER — Ambulatory Visit: Payer: Self-pay | Admitting: Family Medicine

## 2008-10-12 ENCOUNTER — Ambulatory Visit: Payer: Self-pay | Admitting: Family Medicine

## 2008-10-12 DIAGNOSIS — G4700 Insomnia, unspecified: Secondary | ICD-10-CM | POA: Insufficient documentation

## 2008-10-22 ENCOUNTER — Ambulatory Visit: Payer: Self-pay | Admitting: Family Medicine

## 2008-10-22 LAB — CONVERTED CEMR LAB
Bilirubin Urine: NEGATIVE
Blood in Urine, dipstick: NEGATIVE
Glucose, Urine, Semiquant: NEGATIVE
Ketones, urine, test strip: NEGATIVE
Specific Gravity, Urine: <1.005
pH: 7

## 2008-12-05 ENCOUNTER — Telehealth: Payer: Self-pay | Admitting: Family Medicine

## 2008-12-21 ENCOUNTER — Telehealth: Payer: Self-pay

## 2009-02-01 ENCOUNTER — Telehealth: Payer: Self-pay | Admitting: Family Medicine

## 2009-02-22 ENCOUNTER — Ambulatory Visit: Payer: Self-pay | Admitting: Family Medicine

## 2009-02-22 ENCOUNTER — Telehealth: Payer: Self-pay | Admitting: Family Medicine

## 2009-02-22 DIAGNOSIS — D179 Benign lipomatous neoplasm, unspecified: Secondary | ICD-10-CM | POA: Insufficient documentation

## 2009-02-22 DIAGNOSIS — G5790 Unspecified mononeuropathy of unspecified lower limb: Secondary | ICD-10-CM | POA: Insufficient documentation

## 2009-03-21 ENCOUNTER — Telehealth: Payer: Self-pay | Admitting: Family Medicine

## 2009-04-06 ENCOUNTER — Telehealth: Payer: Self-pay | Admitting: Family Medicine

## 2009-04-24 ENCOUNTER — Ambulatory Visit: Payer: Self-pay | Admitting: Family Medicine

## 2009-04-24 LAB — CONVERTED CEMR LAB
Bilirubin Urine: NEGATIVE
Specific Gravity, Urine: 1.025

## 2009-04-25 ENCOUNTER — Telehealth: Payer: Self-pay | Admitting: Family Medicine

## 2009-04-26 ENCOUNTER — Ambulatory Visit: Payer: Self-pay | Admitting: Family Medicine

## 2009-04-26 LAB — CONVERTED CEMR LAB
BUN: 21 mg/dL (ref 6–23)
Basophils Relative: 0.6 % (ref 0.0–3.0)
Chloride: 106 meq/L (ref 96–112)
Eosinophils Absolute: 0.4 10*3/uL (ref 0.0–0.7)
Eosinophils Relative: 3.1 % (ref 0.0–5.0)
GFR calc non Af Amer: 86.67 mL/min (ref >60)
Lymphocytes Relative: 15.5 % (ref 12.0–46.0)
MCHC: 34.3 g/dL (ref 30.0–36.0)
MCV: 86.2 fL (ref 78.0–100.0)
Monocytes Absolute: 0.5 10*3/uL (ref 0.1–1.0)
Neutrophils Relative %: 76.9 % (ref 43.0–77.0)
Platelets: 287 10*3/uL (ref 150.0–400.0)
Potassium: 3.9 meq/L (ref 3.5–5.1)
RBC: 4.65 M/uL (ref 3.87–5.11)
Sed Rate: 17 mm/hr (ref 0–22)
Sodium: 144 meq/L (ref 135–145)
WBC: 11.9 10*3/uL — ABNORMAL HIGH (ref 4.5–10.5)

## 2009-05-06 ENCOUNTER — Inpatient Hospital Stay (HOSPITAL_COMMUNITY): Admission: EM | Admit: 2009-05-06 | Discharge: 2009-05-07 | Payer: Self-pay | Admitting: Emergency Medicine

## 2009-05-06 ENCOUNTER — Emergency Department (HOSPITAL_COMMUNITY): Admission: EM | Admit: 2009-05-06 | Discharge: 2009-05-06 | Payer: Self-pay | Admitting: Emergency Medicine

## 2009-05-07 ENCOUNTER — Ambulatory Visit: Payer: Self-pay | Admitting: Internal Medicine

## 2009-05-09 ENCOUNTER — Ambulatory Visit: Payer: Self-pay | Admitting: Family Medicine

## 2009-05-11 ENCOUNTER — Telehealth: Payer: Self-pay | Admitting: Family Medicine

## 2009-05-11 ENCOUNTER — Ambulatory Visit: Payer: Self-pay | Admitting: Family Medicine

## 2009-08-01 ENCOUNTER — Telehealth: Payer: Self-pay | Admitting: Family Medicine

## 2009-08-18 ENCOUNTER — Encounter: Payer: Self-pay | Admitting: Family Medicine

## 2009-08-24 ENCOUNTER — Ambulatory Visit: Payer: Self-pay | Admitting: Family Medicine

## 2009-08-24 DIAGNOSIS — R413 Other amnesia: Secondary | ICD-10-CM

## 2009-09-21 ENCOUNTER — Telehealth (INDEPENDENT_AMBULATORY_CARE_PROVIDER_SITE_OTHER): Payer: Self-pay | Admitting: *Deleted

## 2009-11-15 ENCOUNTER — Encounter: Payer: Self-pay | Admitting: Family Medicine

## 2009-11-28 ENCOUNTER — Ambulatory Visit: Payer: Self-pay | Admitting: Family Medicine

## 2009-11-28 DIAGNOSIS — N959 Unspecified menopausal and perimenopausal disorder: Secondary | ICD-10-CM | POA: Insufficient documentation

## 2009-12-14 ENCOUNTER — Encounter: Payer: Self-pay | Admitting: Family Medicine

## 2010-01-03 IMAGING — CT CT PELVIS W/O CM
2 of 4 series · 14 of 32 positions shown, 19 images · non-contrast
Comparison: 05/07/2007

CT ABDOMEN

CLINICAL DATA: Right flank pain.

CT ABDOMEN AND PELVIS WITHOUT CONTRAST
TECHNIQUE: Multidetector CT imaging of the abdomen and pelvis was
performed following the standard protocol without intravenous
contrast.

[Series 2: <(id) stone a/p >(id) · axial · 0.84mm/px · z∈[-408,-138]mm · 7 of 74 slices shown, 12 images]
[im 10/74  soft-tissue]
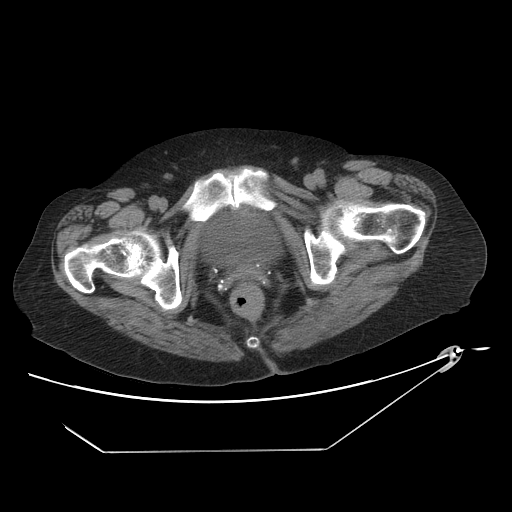
[im 10/74  bone]
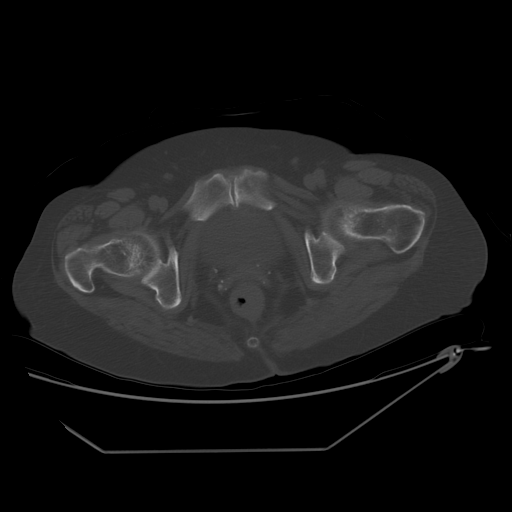
[im 19/74  soft-tissue]
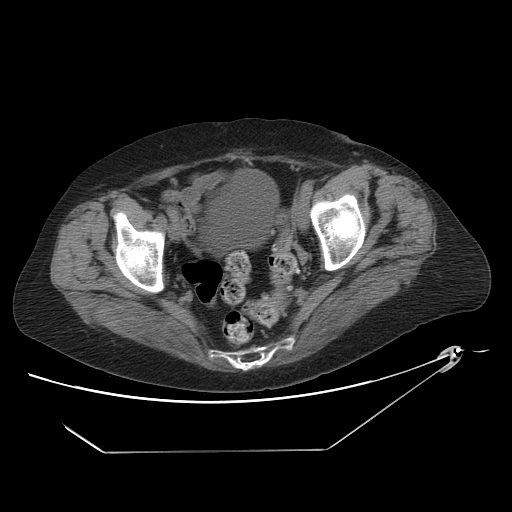
[im 28/74  soft-tissue]
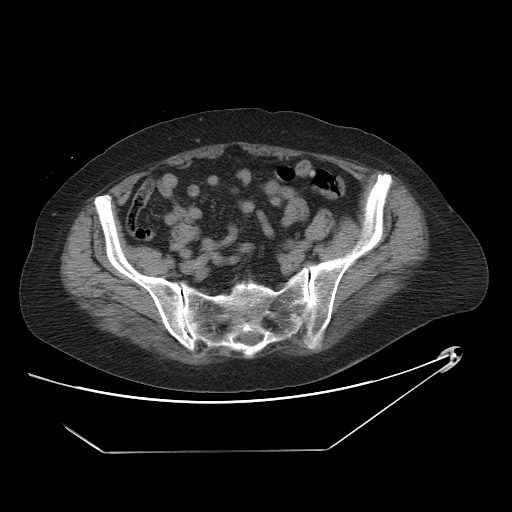
[im 37/74  soft-tissue]
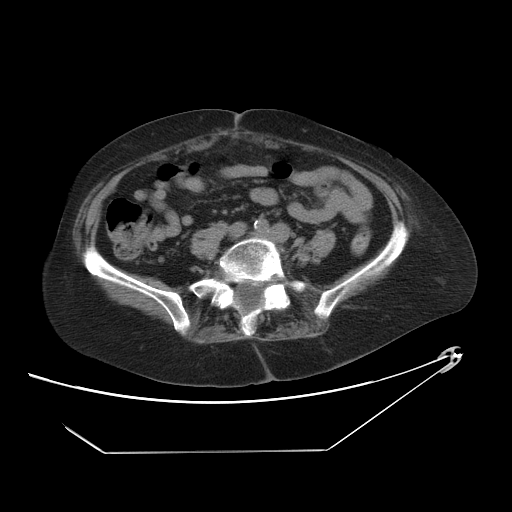
[im 37/74  lung]
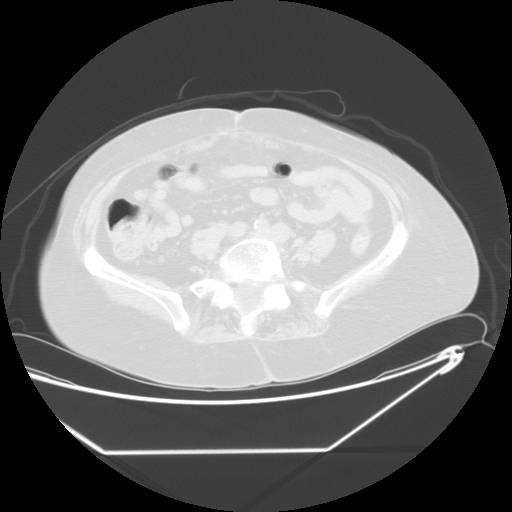
[im 46/74  soft-tissue]
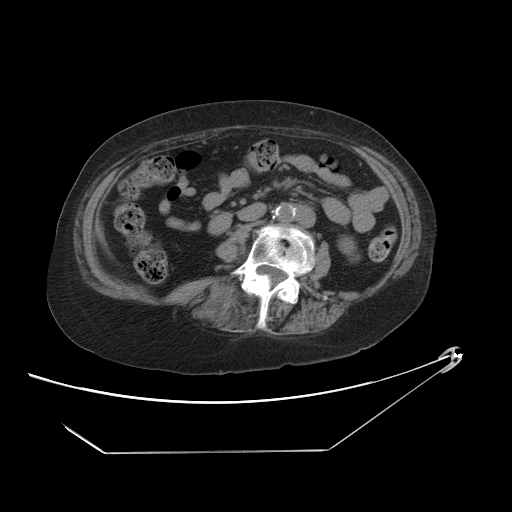
[im 46/74  lung]
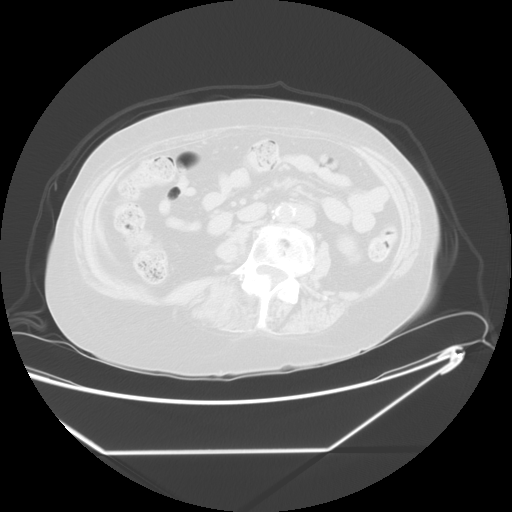
[im 55/74  soft-tissue]
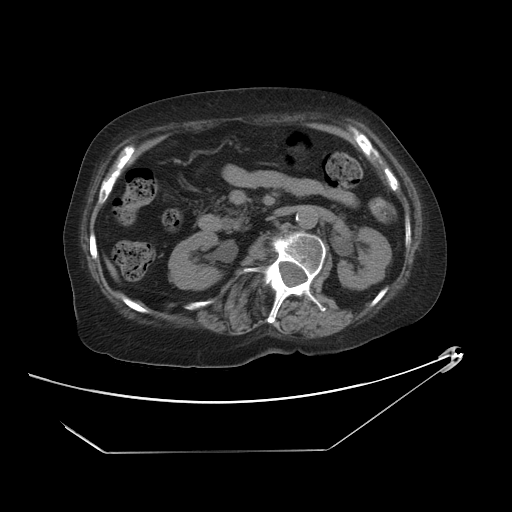
[im 55/74  lung]
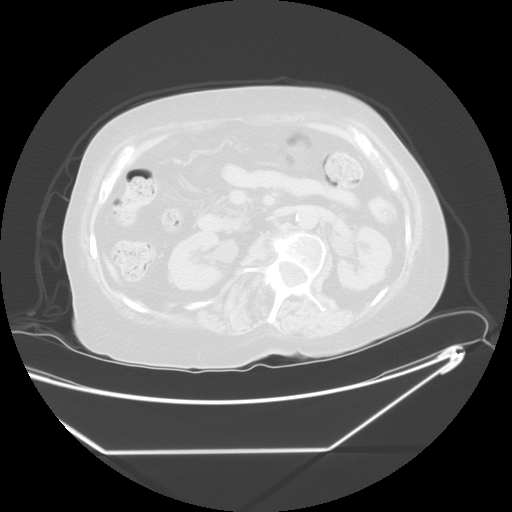
[im 64/74  soft-tissue]
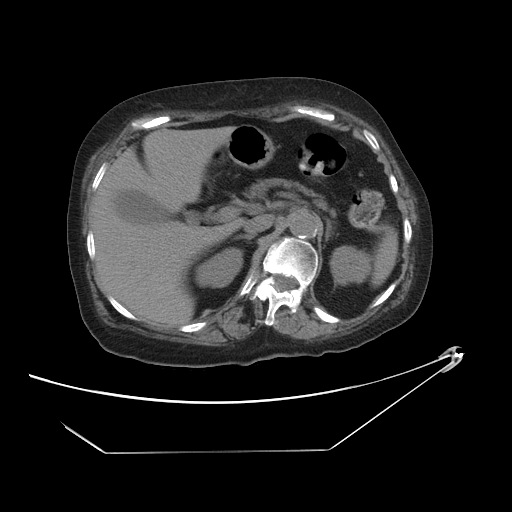
[im 64/74  lung]
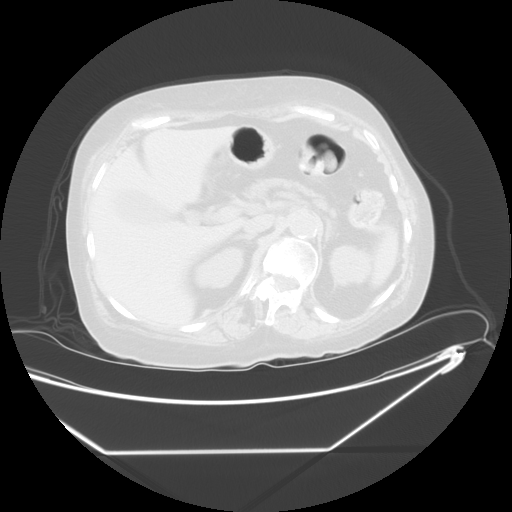

[Series 400: sag a/p · sagittal · 0.84mm/px · 7 of 101 slices shown]
[im 10/101  soft-tissue]
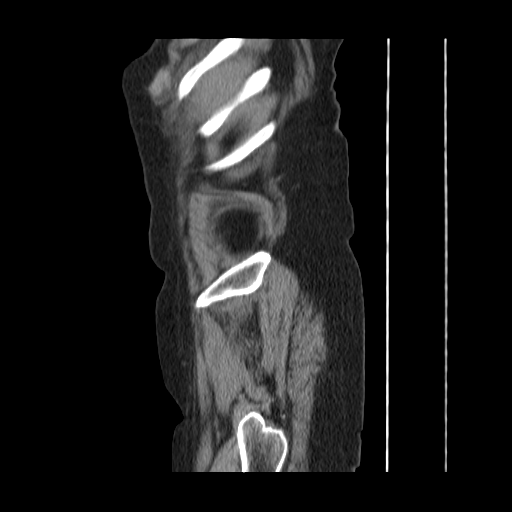
[im 19/101  soft-tissue]
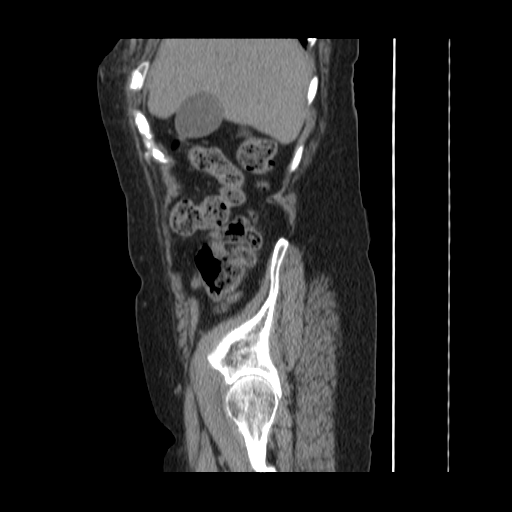
[im 37/101  soft-tissue]
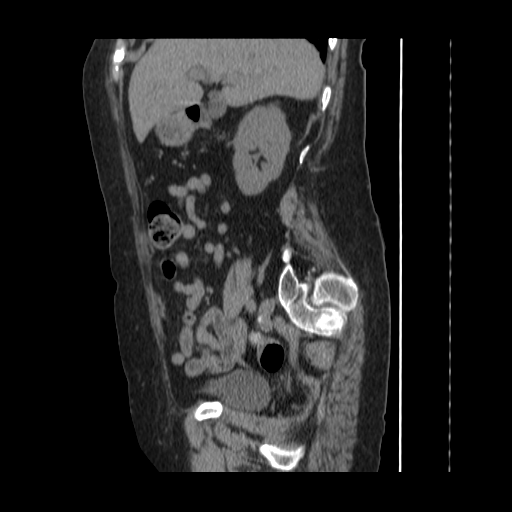
[im 46/101  soft-tissue]
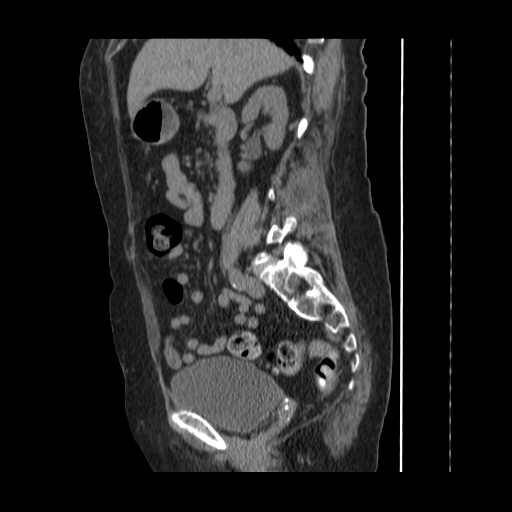
[im 55/101  soft-tissue]
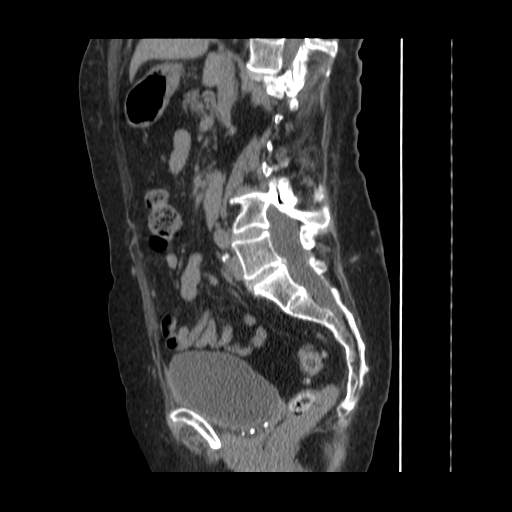
[im 64/101  soft-tissue]
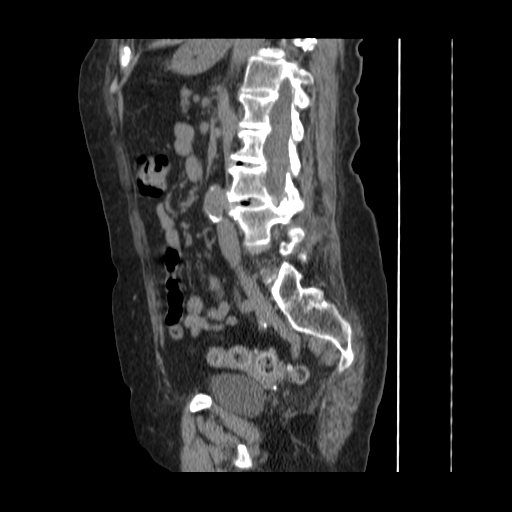
[im 82/101  soft-tissue]
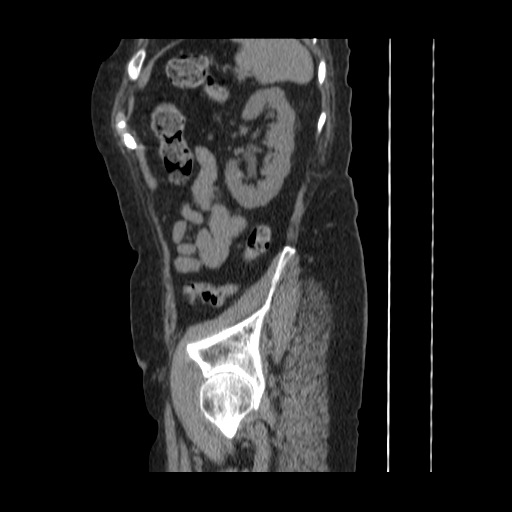

[14 of 32 positions shown; findings below may reference images not displayed]

FINDINGS: Negative for renal calculi.  There is no hydronephrosis.
No renal mass is present.  There is moderate levoscoliosis.  The
bowel is not obstructed.  There is no mass or adenopathy.
IMPRESSION: No acute abnormality.  Negative for renal calculi.

CT PELVIS
FINDINGS: There is sigmoid diverticulosis.  There is no bowel
thickening or dilatation.  There is no free fluid or mass.  There
has  been appendectomy and hysterectomy.
IMPRESSION: No acute abnormality in the pelvis.

## 2010-02-22 ENCOUNTER — Ambulatory Visit: Payer: Self-pay | Admitting: Family Medicine

## 2010-03-20 ENCOUNTER — Telehealth: Payer: Self-pay | Admitting: Family Medicine

## 2010-03-27 ENCOUNTER — Telehealth: Payer: Self-pay | Admitting: Family Medicine

## 2010-03-28 ENCOUNTER — Telehealth: Payer: Self-pay | Admitting: Family Medicine

## 2010-04-10 ENCOUNTER — Telehealth: Payer: Self-pay | Admitting: Family Medicine

## 2010-05-04 ENCOUNTER — Encounter (INDEPENDENT_AMBULATORY_CARE_PROVIDER_SITE_OTHER): Payer: Self-pay | Admitting: *Deleted

## 2010-06-26 ENCOUNTER — Ambulatory Visit: Payer: Self-pay | Admitting: Family Medicine

## 2010-08-21 ENCOUNTER — Ambulatory Visit: Payer: Self-pay | Admitting: Family Medicine

## 2010-09-10 ENCOUNTER — Encounter: Payer: Self-pay | Admitting: Family Medicine

## 2010-09-27 ENCOUNTER — Telehealth (INDEPENDENT_AMBULATORY_CARE_PROVIDER_SITE_OTHER): Payer: Self-pay | Admitting: *Deleted

## 2010-09-27 ENCOUNTER — Ambulatory Visit: Payer: Self-pay | Admitting: Family Medicine

## 2010-12-26 ENCOUNTER — Encounter: Payer: Self-pay | Admitting: Family Medicine

## 2010-12-27 ENCOUNTER — Ambulatory Visit: Payer: Self-pay | Admitting: Family Medicine

## 2011-01-11 ENCOUNTER — Telehealth: Payer: Self-pay | Admitting: Family Medicine

## 2011-01-31 NOTE — Letter (Signed)
Summary: [facility]  [facility]   Imported By: Maryln Gottron 01/03/2010 15:00:26  _____________________________________________________________________  External Attachment:    Type:   Image     Comment:   External Document

## 2011-01-31 NOTE — Progress Notes (Signed)
Summary: doesn't think adderall helping -nuvigil samples #7 given   Phone Note Call from Patient   Caller: Patient Summary of Call: pt called cannot afford  dextoamphetamine wants increase on adderall only took few day of adderall says falling asleep.  Pt did not pick up dextraompetaimne . Initial call taken by: Pura Spice, RN,  March 28, 2010 9:42 AM  Follow-up for Phone Call        per dr Scotty Court take both adderrall in am for 30 days then call back .per dr Alfonzo Feller may need to increaseif not working  pt awaare. pt stated has tried for 3 days taking 3 and med didn't help  Follow-up by: Pura Spice, RN,  March 28, 2010 10:26 AM  Additional Follow-up for Phone Call Additional follow up Details #1::        per dr Scotty Court may need to buy other med dextroamphetamine . also, nuvigil 150mg  samples # 7 given to pt to try informed pt to stop adderall while taking this drug and call with reuslts upon completion.  Additional Follow-up by: Pura Spice, RN,  March 28, 2010 10:40 AM    New/Updated Medications: ADDERALL XR 15 MG XR24H-CAP (AMPHETAMINE-DEXTROAMPHETAMINE) 2 by mouth q am

## 2011-01-31 NOTE — Letter (Signed)
Summary: [facility]   Imported By: Maryln Gottron 01/02/2011 14:23:02  _____________________________________________________________________  External Attachment:    Type:   Image     Comment:   External Document  Appended Document: [facility] reviewed

## 2011-01-31 NOTE — Progress Notes (Signed)
Summary: Current RX not covered by insurance.  Phone Note From Pharmacy Call back at [phone]   Caller: [facility]* Reason for Call: Patient requests substitution Summary of Call: Call regarding dexadrine - medication no longer covered under patient's insurance - can you substitute something else? Initial call taken by: Everrett Coombe,  March 20, 2010 3:13 PM  Follow-up for Phone Call        tell her look in her formulary to see whats covered or call insurance to see what they do cover they usually give them few choices of meds that are covered.  Follow-up by: Pura Spice, RN,  March 20, 2010 3:19 PM  Additional Follow-up for Phone Call Additional follow up Details #1::        pt returned call and will call after she finds out med insurance will pay.  Additional Follow-up by: Pura Spice, RN,  March 21, 2010 9:45 AM     Appended Document: Current RX not covered by insurance. [name] from [employer] called stated the closest med to dexadrine is the adderall xr 15 mg generic  that her insurance will cover. call [name] t [phone] or pt when to pick up rx. pt notified to pick up ,.......ghrn.......Marland Kitchen

## 2011-01-31 NOTE — Assessment & Plan Note (Signed)
Summary: FLU SHOT/CJR/PT RSC/CJR  Nurse Visit   Allergies: 1)  ! Sulfa  Immunizations Administered:  Influenza Vaccine # 1:    Vaccine Type: Fluvax MCR    Site: left deltoid    Mfr: [employer]    Dose: 0.5 ml    Route: IM    Given by: Kathrynn Speed CMA    Exp. Date: 06/29/2011    Lot #: [id number]    VIS given: 07/24/10 version given September 27, 2010.  Flu Vaccine Consent Questions:    Do you have a history of severe allergic reactions to this vaccine? no    Any prior history of allergic reactions to egg and/or gelatin? no    Do you have a sensitivity to the preservative Thimersol? no    Do you have a past history of Guillan-Barre Syndrome? no    Do you currently have an acute febrile illness? no    Have you ever had a severe reaction to latex? no    Vaccine information given and explained to patient? yes    Are you currently pregnant? no  Orders Added: 1)  Influenza Vaccine MCR [00025]

## 2011-01-31 NOTE — Progress Notes (Signed)
Summary: wants dextroamphetamine  rx   Phone Note Call from Patient   Caller: Patient Summary of Call: states adderall not helping  Initial call taken by: Pura Spice, RN,  March 27, 2010 9:24 AM  Follow-up for Phone Call        per dr Scotty Court if adderall not helping  needs to go back on other drug and will have to pay for it out of pocket. Follow-up by: Pura Spice, RN,  March 27, 2010 9:28 AM  Additional Follow-up for Phone Call Additional follow up Details #1::        pt notified And will bring rx  back tomorrow for the adderall in exchange for dextroamphetamine . to pick up tomorrow  Additional Follow-up by: Pura Spice, RN,  March 27, 2010 9:33 AM    New/Updated Medications: DEXTROAMPHETAMINE SULFATE CR 15 MG XR24H-CAP (DEXTROAMPHETAMINE SULFATE) take 1 by mouth morning. and 1 by mouth midafternoon.  fill March 28 2010 DEXTROAMPHETAMINE SULFATE CR 15 MG XR24H-CAP (DEXTROAMPHETAMINE SULFATE) 1 by mouth in morning and 1 by mouth midafternoon. fill April 28 2010 DEXTROAMPHETAMINE SULFATE CR 15 MG XR24H-CAP (DEXTROAMPHETAMINE SULFATE) 1 by mouth morning and 1 by mouth mid afternoon.  fill May 28 2010 Prescriptions: DEXTROAMPHETAMINE SULFATE CR 15 MG XR24H-CAP (DEXTROAMPHETAMINE SULFATE) 1 by mouth morning and 1 by mouth mid afternoon.  fill May 28 2010  #60 x 0   Entered by:   Pura Spice, RN   Authorized by:   Judithann Sheen MD   Signed by:   Pura Spice, RN on 03/27/2010   Method used:   Print then Give to Patient   RxID:   [id number] DEXTROAMPHETAMINE SULFATE CR 15 MG XR24H-CAP (DEXTROAMPHETAMINE SULFATE) 1 by mouth in morning and 1 by mouth midafternoon. fill April 28 2010  #60 x 0   Entered by:   Pura Spice, RN   Authorized by:   Judithann Sheen MD   Signed by:   Pura Spice, RN on 03/27/2010   Method used:   Print then Give to Patient   RxID:   [id number] DEXTROAMPHETAMINE SULFATE CR 15 MG XR24H-CAP (DEXTROAMPHETAMINE SULFATE) take 1  by mouth morning. and 1 by mouth midafternoon.  fill March 28 2010  #60 x 0   Entered by:   Pura Spice, RN   Authorized by:   Judithann Sheen MD   Signed by:   Pura Spice, RN on 03/27/2010   Method used:   Print then Give to Patient   RxID:   [id number]

## 2011-01-31 NOTE — Letter (Signed)
Summary: [facility]  [facility]   Imported By: Maryln Gottron 01/03/2010 13:10:08  _____________________________________________________________________  External Attachment:    Type:   Image     Comment:   External Document

## 2011-01-31 NOTE — Assessment & Plan Note (Signed)
Summary: APPT SCHED IN ERROR  CC: PT THOUGHT SHE WAS SEEING ACCUPUNCTURE, SAYS WAS REFERRED BY DR RAMOS. APPT CANCELLED   Allergies: 1)  ! Sulfa   Complete Medication List: 1)  Prochlorperazine Maleate 10 Mg Tabs (Prochlorperazine maleate) .Marland Kitchen.. 1 tablet every 6 hrs as needed nausea 2)  Lonox 2.5-0.025 Mg Tabs (Diphenoxylate-atropine) .... Take 2 tabs four times a day and at bedtime  as needed diarrhea 3)  Temazepam 30 Mg Caps (Temazepam) .Marland Kitchen.. 1 bedtime for sleep 4)  Alprazolam 0.25 Mg Tabs (Alprazolam) .Marland Kitchen.. 1 three timesw as needed for stress 5)  Aricept 10 Mg Tabs (Donepezil hcl) .Marland Kitchen.. 1 once daily 6)  Levbid 0.375 Mg Tb12 (Hyoscyamine sulfate) .Marland Kitchen.. 1 two times a day fir ibs 7)  Oxycodone Hcl 30 Mg Tabs (Oxycodone hcl) .... Dr Ethelene Hal 8)  Hyoscyamine Sulfate Cr 0.375 Mg Xr12h-tab (Hyoscyamine sulfate) .Marland Kitchen.. 1 by mouth two times a day 9)  Diclofenac Sodium 75 Mg Tbec (Diclofenac sodium) .Marland Kitchen.. 1 two times a day for inflammation 10)  Tramadol Hcl 50 Mg Tabs (Tramadol hcl) .Marland Kitchen.. 1 or 2 every 4-6 hrs as needed pain. 11)  Fentanyl 100 Mcg/hr Pt72 (Fentanyl) .... Dr Ethelene Hal 12)  Vitamin D (ergocalciferol) 50000 Unit Caps (Ergocalciferol) .... Dr Petra Kuba 13)  Cortisporin 3.5-10000-1 Soln (Neomycin-polymyxin-hc) .... W qtts  in ear three times a day for external otitis 14)  Estradiol 0.5 Mg Tabs (Estradiol) .Marland Kitchen.. 1 qd 15)  Flexeril 10 Mg Tabs (Cyclobenzaprine hcl) .... Dr Ethelene Hal 16)  Lyrica 50 Mg Caps (Pregabalin) .Marland Kitchen.. 1 two times a day 17)  Adderall Xr 15 Mg Xr24h-cap (Amphetamine-dextroamphetamine) .... 2 by mouth q am 18)  Dextroamphetamine Sulfate Cr 15 Mg Xr24h-cap (Dextroamphetamine sulfate) .... Take 1 am and 1 by mouth midafternoon. fill on April 11 2010 19)  Amphetamine-dextroamphetamine 15 Mg Xr24h-cap (Amphetamine-dextroamphetamine) .Marland Kitchen.. 1 by mouth am and 1 by mouth mid afternoon fill May 11 2010 20)  Dextroamphetamine Sulfate Cr 15 Mg Xr24h-cap (Dextroamphetamine sulfate) .Marland Kitchen.. 1 by mouth am  and 1 by mouth mid afternoon. June 11 2010

## 2011-01-31 NOTE — Progress Notes (Signed)
Summary: pneumonia Question  Phone Note Call from Patient   Caller: Patient Summary of Call: Pt. Wants to know if she needs to have a pmhpneumonia shot. Initial call taken by: Georgian Co,  September 27, 2010 2:33 PM  Follow-up for Phone Call        It's showing 12/30/1996. According to records she received at 25 so therefore pt is good for a lifetime. Patient informed Follow-up by: Josph Macho RMA,  September 28, 2010 11:08 AM       Allergies: 1)  ! Sulfa

## 2011-01-31 NOTE — Assessment & Plan Note (Signed)
Summary: med check/refills/cjr   Vital Signs:  Patient profile:   77 year old female Weight:      135 pounds O2 Sat:      97 % Temp:     97 degrees F Pulse rate:   91 / minute Pulse rhythm:   regular BP sitting:   130 / 80  (left arm) Cuff size:   regular  Vitals Entered By: Pura Spice, RN (December 27, 2010 1:19 PM) CC: follow up meds needs refills    History of Present Illness: This 77 year old white widow with multiple medical problems especially low back pain syndrome and being seen by Dr. Algis Greenhouse is with regular epidural injections. She is in today to refill medications for insomnia narcolepsy as well as memory loss In general is doing relatively well ,discussed her lifestyle and that it is  Allergies: 1)  ! Sulfa  Review of Systems      See HPI  Physical Exam  General:  Well-developed,well-nourished,in no acute distress; alert,appropriate and cooperative throughout examinationoverweight-appearing.  ,obvious lumbar spine problem with flexion formerly was walking Lungs:  Normal respiratory effort, chest expands symmetrically. Lungs are clear to auscultation, no crackles or wheezes. Heart:  Normal rate and regular rhythm. S1 and S2 normal without gallop, murmur, click, rub or other extra sounds. Abdomen:  Bowel sounds positive,abdomen soft and non-tender without masses, organomegaly or hernias noted. Msk:  rigidity or spine some tenderness over both sacroiliac joints bilaterally Psych:  Oriented X3.  ,short-term memory loss   Impression & Recommendations:  Problem # 1:  POSTMENOPAUSAL SYNDROME (ICD-627.9) Assessment Unchanged  Problem # 2:  MEMORY LOSS (ICD-780.93) Assessment: Improved   Aricept 10 mg q.d.  Orders: Prescription Created Electronically [phone])  Problem # 3:  LOW BACK PAIN SYNDROME (ICD-724.2) Assessment: Unchanged  The following medications were removed from the medication list:    Diclofenac Sodium 75 Mg Tbec (Diclofenac sodium) .Marland Kitchen... 1 two  times a day for inflammation Her updated medication list for this problem includes:    Oxycodone Hcl 30 Mg Tabs (Oxycodone hcl) .Marland Kitchen... Dr Ethelene Hal    Tramadol Hcl 50 Mg Tabs (Tramadol hcl) .Marland Kitchen... 1 or 2 every 4-6 hrs as needed pain.    Fentanyl 100 Mcg/hr Pt72 (Fentanyl) .Marland Kitchen... Dr Ethelene Hal    Flexeril 10 Mg Tabs (Cyclobenzaprine hcl) .Marland Kitchen... Dr Ethelene Hal Dr. 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  Problem # 5:  INSOMNIA-SLEEP DISORDER-UNSPEC (ICD-780.52) Assessment: Unchanged  Her updated medication list for this problem includes:    Temazepam 30 Mg Caps (Temazepam) .Marland Kitchen... 1 bedtime for sleep  Problem # 6:  BACK PAIN, LUMBAR, CHRONIC (ICD-724.2) Assessment: Unchanged  The following medications were removed from the medication list:    Diclofenac Sodium 75 Mg Tbec (Diclofenac sodium) .Marland Kitchen... 1 two times a day for inflammation Her updated medication list for this problem includes:    Oxycodone Hcl 30 Mg Tabs (Oxycodone hcl) .Marland Kitchen... Dr Ethelene Hal    Tramadol Hcl 50 Mg Tabs (Tramadol hcl) .Marland Kitchen... 1 or 2 every 4-6 hrs as needed pain.    Fentanyl 100 Mcg/hr Pt72 (Fentanyl) .Marland Kitchen... Dr Ethelene Hal    Flexeril 10 Mg Tabs (Cyclobenzaprine hcl) .Marland Kitchen... Dr Ethelene Hal  Problem # 7:  NARCOLEPSY WITHOUT CATAPLEXY (ICD-347.00) Assessment: Unchanged dextroamphetamine 15 mg 1 AM and  midafternoon  Problem # 8:  ANXIETY DEPRESSION (ICD-300.4) Assessment: Unchanged  Problem # 9:  IBS (ICD-564.1) Assessment: Improved  Levbid 0.375 mg b.i.d. as well as Lomotil  when the  Problem # 10:  OSTEOARTHRITIS (ICD-715.90) Assessment: Unchanged  The following medications were removed from the medication list:    Diclofenac Sodium 75 Mg Tbec (Diclofenac sodium) .Marland Kitchen... 1 two times a day for inflammation Her updated medication list for this problem includes:    Oxycodone Hcl 30 Mg Tabs (Oxycodone hcl) .Marland Kitchen... Dr Ethelene Hal    Tramadol  Hcl 50 Mg Tabs (Tramadol hcl) .Marland Kitchen... 1 or 2 every 4-6 hrs as needed pain.    Fentanyl 100 Mcg/hr Pt72 (Fentanyl) .Marland Kitchen... Dr Ethelene Hal  Complete Medication List: 1)  Prochlorperazine Maleate 10 Mg Tabs (Prochlorperazine maleate) .Marland Kitchen.. 1 tablet every 6 hrs as needed nausea 2)  Lonox 2.5-0.025 Mg Tabs (Diphenoxylate-atropine) .... Take 2 tabs four times a day and at bedtime  as needed diarrhea 3)  Temazepam 30 Mg Caps (Temazepam) .Marland Kitchen.. 1 bedtime for sleep 4)  Alprazolam 0.25 Mg Tabs (Alprazolam) .Marland Kitchen.. 1 three timesw as needed for stress 5)  Aricept 10 Mg Tabs (Donepezil hcl) .Marland Kitchen.. 1 once daily 6)  Levbid 0.375 Mg Tb12 (Hyoscyamine sulfate) .Marland Kitchen.. 1 two times a day fir ibs 7)  Oxycodone Hcl 30 Mg Tabs (Oxycodone hcl) .... Dr Ethelene Hal 8)  Tramadol Hcl 50 Mg Tabs (Tramadol hcl) .Marland Kitchen.. 1 or 2 every 4-6 hrs as needed pain. 9)  Fentanyl 100 Mcg/hr Pt72 (Fentanyl) .... Dr Ethelene Hal 10)  Vitamin D (ergocalciferol) 50000 Unit Caps (Ergocalciferol) .... Dr Petra Kuba 11)  Cortisporin 3.5-10000-1 Soln (Neomycin-polymyxin-hc) .... W qtts  in ear three times a day for external otitis 12)  Flexeril 10 Mg Tabs (Cyclobenzaprine hcl) .... Dr Ethelene Hal 13)  Amphetamine-dextroamphetamine 15 Mg Tabs (Amphetamine-dextroamphetamine) .Marland Kitchen.. 1 am and midafternoon for narcolepsay fill on 01 Jan 2011 14)  Amphetamine-dextroamphetamine 15 Mg Tabs (Amphetamine-dextroamphetamine) .Marland Kitchen.. 1 am and misdafternoon, fill 01 Feb 2011 15)  Amphetamine-dextroamphetamine 15 Mg Tabs (Amphetamine-dextroamphetamine) .Marland Kitchen.. 1 am and midahfternoon, fill on 3 dec 16)  Aricept 23 Mg Tabs (Donepezil hcl) .Marland Kitchen.. 1 once daily  Patient Instructions: 1)  continued pain control under Dr. Delford Field motion 2)  I have refilled her medications for her multiple medical problems return as needed however must return in 3 months Prescriptions: ARICEPT 23 MG TABS (DONEPEZIL HCL) 1 once daily  #30 x 11   Entered and Authorized by:   Judithann Sheen MD   Signed by:   Judithann Sheen MD  on 12/27/2010   Method used:   Electronically to        [facility]  [place] [address] (retail)       [address]       [place], [place]  [address]       Ph: [phone] or [phone]       Fax: [phone]   RxID:   [id number] ARICEPT 23 MG TABS (DONEPEZIL HCL) 1 once daily  #30 x 11   Entered and Authorized by:   Judithann Sheen MD   Signed by:   Judithann Sheen MD on 12/27/2010   Method used:   [facility]        [facility]  [place] [address] (retail[facility]       [address]       [place], [place]  [address]       Ph: [phone] or [phone]       Fax: [phone]   RxID:   [id number] TRAMADOL HCL 50 MG TABS (TRAMADOL HCL)  1 or 2 every 4-6 hrs as needed pain.  #100 x 5   Entered and Authorized by:   Judithann Sheen MD   Signed by:   Judithann Sheen MD on 12/27/2010   Method used:   Print then Give to Patient   RxID:   [id number] ALPRAZOLAM 0.25 MG  TABS (ALPRAZOLAM) 1 three timesw as needed for stress  #90 x 5   Entered and Authorized by:   Judithann Sheen MD   Signed by:   Judithann Sheen MD on 12/27/2010   Method used:   Print then Give to Patient   RxID:   [id number] TEMAZEPAM 30 MG  CAPS (TEMAZEPAM) 1 bedtime for sleep  #30 x 5   Entered and Authorized by:   Judithann Sheen MD   Signed by:   Judithann Sheen MD on 12/27/2010   Method used:   Print then Give to Patient   RxID:   [id number] AMPHETAMINE-DEXTROAMPHETAMINE 15 MG TABS (AMPHETAMINE-DEXTROAMPHETAMINE) 1 AM and midahfternoon, fill on 3 Dec  #60 x 0   Entered and Authorized by:   Judithann Sheen MD   Signed by:   Judithann Sheen MD on 12/27/2010   Method used:   Print then Give to Patient   RxID:   [id number] AMPHETAMINE-DEXTROAMPHETAMINE 15 MG TABS (AMPHETAMINE-DEXTROAMPHETAMINE) 1 AM and misdafternoon, fill 01 Feb 2011  #60 x 0   Entered and Authorized by:   Judithann Sheen MD   Signed by:   Judithann Sheen MD on 12/27/2010    Method used:   Print then Give to Patient   RxID:   [id number] AMPHETAMINE-DEXTROAMPHETAMINE 15 MG TABS (AMPHETAMINE-DEXTROAMPHETAMINE) 1 AM and midafternoon for narcolepsay fill on 01 Jan 2011  #60 x 0   Entered and Authorized by:   Judithann Sheen MD   Signed by:   Judithann Sheen MD on 12/27/2010   Method used:   Print then Give to Patient   RxID:   [id number]    Orders Added: 1)  Prescription Created Electronically [G8553] 2)  Est. Patient Level IV [03500]

## 2011-01-31 NOTE — Progress Notes (Signed)
Summary: generic Dexadrine  Phone Note Call from Patient Call back at Home Phone [phone] Call back at [phone] cell   Caller: Patient Call For: Judithann Sheen MD Reason for Call: Talk to Nurse Summary of Call: Pt states the Nuvagil is not working and the pharmacy told her Dexadrine generic will be available again.  Wants prescription for this. [phone] Initial call taken by: Lynann Beaver CMA,  April 10, 2010 3:59 PM  Follow-up for Phone Call        Pt called again re: status of Dexadrine. Pls call asap. Follow-up by: Lucy Antigua,  April 11, 2010 10:35 AM  Additional Follow-up for Phone Call Additional follow up Details #1::        ok per dr Scotty Court  can pick up today  Additional Follow-up by: Pura Spice, RN,  April 11, 2010 11:01 AM    Additional Follow-up for Phone Call Additional follow up Details #2::    Pt. notified. Follow-up by: Lynann Beaver CMA,  April 11, 2010 11:17 AM  New/Updated Medications: DEXTROAMPHETAMINE SULFATE CR 15 MG XR24H-CAP (DEXTROAMPHETAMINE SULFATE) take 1 am and 1 by mouth midafternoon. fill on April 11 2010 AMPHETAMINE-DEXTROAMPHETAMINE 15 MG XR24H-CAP (AMPHETAMINE-DEXTROAMPHETAMINE) 1 by mouth am and 1 by mouth mid afternoon fill May 11 2010 DEXTROAMPHETAMINE SULFATE CR 15 MG XR24H-CAP (DEXTROAMPHETAMINE SULFATE) 1 by mouth am and 1 by mouth mid afternoon. June 11 2010 Prescriptions: DEXTROAMPHETAMINE SULFATE CR 15 MG XR24H-CAP (DEXTROAMPHETAMINE SULFATE) 1 by mouth am and 1 by mouth mid afternoon. June 11 2010  #60 x 0   Entered by:   Pura Spice, RN   Authorized by:   Judithann Sheen MD   Signed by:   Pura Spice, RN on 04/11/2010   Method used:   Print then Give to Patient   RxID:   [id number] AMPHETAMINE-DEXTROAMPHETAMINE 15 MG XR24H-CAP (AMPHETAMINE-DEXTROAMPHETAMINE) 1 by mouth am and 1 by mouth mid afternoon fill May 11 2010  #60 x 0   Entered by:   Pura Spice, RN   Authorized by:   Judithann Sheen MD   Signed by:   Pura Spice, RN on 04/11/2010   Method used:   Print then Give to Patient   RxID:   [id number] DEXTROAMPHETAMINE SULFATE CR 15 MG XR24H-CAP (DEXTROAMPHETAMINE SULFATE) take 1 am and 1 by mouth midafternoon. fill on April 11 2010  #60 x 0   Entered by:   Pura Spice, RN   Authorized by:   Judithann Sheen MD   Signed by:   Pura Spice, RN on 04/11/2010   Method used:   Print then Give to Patient   RxID:   [id number]

## 2011-01-31 NOTE — Assessment & Plan Note (Signed)
Summary: med check/refills/cjr   Vital Signs:  Patient profile:   77 year old female Weight:      141 pounds Temp:     97.5 degrees F Pulse rate:   90 / minute BP sitting:   130 / 80  (left arm) Cuff size:   regular  Vitals Entered By: Pura Spice, RN (February 22, 2010 10:55 AM) CC: Med Refills. Stated fell Monday  Feb 20 2010 while getting out bed. Bruised Lorbital area and c/o pain when takes breath in.  Is Patient Diabetic? No   History of Present Illness: This 77 year old white female is in to have her medications refilled which he takes on a regular basis for her medical problems. Relates she has been doing relatively well until Monday when she fell getting out of bed and injured her left chest as well as hitting her face just above the left eye. She continues to have her chronic low back pain and continues to discuss her memory loss which she feels the Aricept is helping, urobilinogen syndrome and then control with the Levbid and Lomotil Dr. Laney Potash continues to handle her analgesics for her chronic back pain year her thoughts c continues to be a problem if she does not continue to take the dextroamphetamine She continues to need her medication for anxiety and depression, also for her insomnia      Allergies: 1)  ! Sulfa  Past History:  Past Medical History: Last updated: 11/28/2009 Osteoarthritis Osteoporosis Pneumonia, hx of Chronic pain with narcotic management Hyperlipidemia Hypertension chronic back pain degenerative joint disease  Past Surgical History: Last updated: 07/08/2007 Appendectomy Hysterectomy  Risk Factors: Smoking Status: quit (08/02/2008)  Review of Systems  The patient denies anorexia, fever, weight loss, weight gain, vision loss, decreased hearing, hoarseness, chest pain, syncope, dyspnea on exertion, peripheral edema, prolonged cough, headaches, hemoptysis, abdominal pain, melena, hematochezia, severe indigestion/heartburn, hematuria,  incontinence, genital sores, muscle weakness, suspicious skin lesions, transient blindness, difficulty walking, depression, unusual weight change, abnormal bleeding, enlarged lymph nodes, angioedema, breast masses, and testicular masses.    Physical Exam  General:  Well-developed,well-nourished,in no acute distress; alert,appropriate and cooperative throughout examination Head:  [place] just above the left overall ridge Eyes:  No corneal or conjunctival inflammation noted. EOMI. Perrla. Funduscopic exam benign, without hemorrhages, exudates or papilledema. Vision grossly normal. Neck:  No deformities, masses, or tenderness noted. Chest Wall:  tenderness over the left costochondral region C3-7 Breasts:  silicone mammoplasties for breast, very firm Lungs:  Normal respiratory effort, chest expands symmetrically. Lungs are clear to auscultation, no crackles or wheezes. Heart:  Normal rate and regular rhythm. S1 and S2 normal without gallop, murmur, click, rub or other extra sounds. Extremities:  No clubbing, cyanosis, edema, or deformity noted with normal full range of motion of all joints.     Impression & Recommendations:  Problem # 1:  CONTUSION, LEFT CHEST WALL (ICD-922.1) Assessment New  Orders: Prescription Created Electronically [phone])  Problem # 2:  POSTMENOPAUSAL SYNDROME (ICD-627.9) Assessment: Improved  Her updated medication list for this problem includes:    Estradiol 0.5 Mg Tabs (Estradiol) .Marland Kitchen... 1 qd  Problem # 3:  MEMORY LOSS (ICD-780.93) Assessment: Improved Aricept 10 mg q.d.  Problem # 4:  LOW BACK PAIN SYNDROME (ICD-724.2) Assessment: Unchanged  Her updated medication list for this problem includes:    Oxycodone Hcl 30 Mg Tabs (Oxycodone hcl) .Marland Kitchen... Dr Ethelene Hal    Diclofenac Sodium 75 Mg Tbec (Diclofenac sodium) .Marland Kitchen... 1 two times a day  for inflammation    Tramadol Hcl 50 Mg Tabs (Tramadol hcl) .Marland Kitchen... 1 or 2 every 4-6 hrs as needed pain.    Fentanyl 100 Mcg/hr  Pt72 (Fentanyl) .Marland Kitchen... Dr Ethelene Hal    Flexeril 10 Mg Tabs (Cyclobenzaprine hcl) .Marland Kitchen... Dr Ethelene Hal  Problem # 5:  PERIPHERAL NEUROPATHY, LOWER EXTREMITY, RIGHT (ICD-355.8) Assessment: Unchanged discontinue Neurontin and Lyrica  Problem # 6:  BACK PAIN, LUMBAR, CHRONIC (ICD-724.2) Assessment: Unchanged  Her updated medication list for this problem includes:    Oxycodone Hcl 30 Mg Tabs (Oxycodone hcl) .Marland Kitchen... Dr Ethelene Hal    Diclofenac Sodium 75 Mg Tbec (Diclofenac sodium) .Marland Kitchen... 1 two times a day for inflammation    Tramadol Hcl 50 Mg Tabs (Tramadol hcl) .Marland Kitchen... 1 or 2 every 4-6 hrs as needed pain.    Fentanyl 100 Mcg/hr Pt72 (Fentanyl) .Marland Kitchen... Dr Ethelene Hal    Flexeril 10 Mg Tabs (Cyclobenzaprine hcl) .Marland Kitchen... 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 1 tablet every 6 hrs as needed nausea 2)  Lonox 2.5-0.025 Mg Tabs (Diphenoxylate-atropine) .... Take 2 tabs four times a day and at bedtime  as needed diarrhea 3)  Temazepam 30 Mg Caps (Temazepam) .Marland Kitchen.. 1 bedtime for sleep 4)  Alprazolam 0.25 Mg Tabs (Alprazolam) .Marland Kitchen.. 1 three timesw as needed for stress 5)  Aricept 10 Mg Tabs (Donepezil hcl) .Marland Kitchen.. 1 once daily 6)  Levbid 0.375 Mg Tb12 (Hyoscyamine sulfate) .Marland Kitchen.. 1 two times a day fir ibs 7)  Dextroamphetamine Sulfate Cr 15 Mg Cp24 (Dextroamphetamine sulfate) .Marland Kitchen.. 1 every am  and 1 midafternoon for narcolepsy fill on sept 3 2010 8)  Oxycodone Hcl 30 Mg Tabs (Oxycodone hcl) .... Dr Ethelene Hal 9)  Hyoscyamine Sulfate Cr 0.375 Mg Xr12h-tab (Hyoscyamine sulfate) .Marland Kitchen.. 1 by mouth two times a day 10)  Diclofenac Sodium 75 Mg  Tbec (Diclofenac sodium) .Marland Kitchen.. 1 two times a day for inflammation 11)  Tramadol Hcl 50 Mg Tabs (Tramadol hcl) .Marland Kitchen.. 1 or 2 every 4-6 hrs as needed pain. 12)  Fentanyl 100 Mcg/hr Pt72 (Fentanyl) .... Dr Ethelene Hal 13)  Vitamin D (ergocalciferol) 50000 Unit Caps (Ergocalciferol) .... Dr Petra Kuba 14)  Dextroamphetamine Sulfate Cr 15 Mg Xr24h-cap (Dextroamphetamine sulfate) .Marland Kitchen.. 1po in morning and 1 by mouth in mid afternoon.   mar 3 15)  Dextroamphetamine Sulfate Cr 15 Mg Xr24h-cap (Dextroamphetamine sulfate) .Marland Kitchen.. 1 by mouth in am and 1 by mouth mid afternoon.   fill for apri 3 16)  Cortisporin 3.5-10000-1 Soln (Neomycin-polymyxin-hc) .... W qtts  in ear three times a day for external otitis 17)  Estradiol 0.5 Mg Tabs (Estradiol) .Marland Kitchen.. 1 qd 18)  Flexeril 10 Mg Tabs (Cyclobenzaprine hcl) .... Dr Ethelene Hal 19)  Lyrica 50 Mg Caps (Pregabalin) .Marland Kitchen.. 1 two times a day 20)  Dextroamphetamine Sulfate Cr 15 Mg Xr24h-cap (Dextroamphetamine sulfate) .Marland Kitchen.. 1 am and  midafternoon fill on may 3  Patient Instructions: 1)  continue medications as prescribed 2)  He here and treated for your chest pain from the contusion 3)  Return for your scheduled physical evaluation Prescriptions: LYRICA 50 MG CAPS (PREGABALIN) 1 two times a day  #60 x 11   Entered and Authorized by:   Valarie Merino  Montez Hageman MD   Signed by:   Judithann Sheen MD on 02/22/2010   Method used:   Print then Give to Patient   RxID:   [id number] TEMAZEPAM 30 MG  CAPS (TEMAZEPAM) 1 bedtime for sleep  #30 x 5   Entered and Authorized by:   Judithann Sheen MD   Signed by:   Judithann Sheen MD on 02/22/2010   Method used:   Print then Give to Patient   RxID:   [id number] DEXTROAMPHETAMINE SULFATE CR 15 MG XR24H-CAP (DEXTROAMPHETAMINE SULFATE) 1 AM and  midafternoon fill on may 3  #60 x 0   Entered and Authorized by:   Judithann Sheen MD   Signed by:   Judithann Sheen MD on 02/22/2010   Method used:   Print then Give to  Patient   RxID:   [id number] DEXTROAMPHETAMINE SULFATE CR 15 MG XR24H-CAP (DEXTROAMPHETAMINE SULFATE) 1 by mouth in am and 1 by mouth mid afternoon.   fill for apri 3  #60 x 0   Entered and Authorized by:   Judithann Sheen MD   Signed by:   Judithann Sheen MD on 02/22/2010   Method used:   Print then Give to Patient   RxID:   [id number] DEXTROAMPHETAMINE SULFATE CR 15 MG XR24H-CAP (DEXTROAMPHETAMINE SULFATE) 1po in morning and 1 by mouth in mid afternoon.   mar 3  #60 x 0   Entered and Authorized by:   Judithann Sheen MD   Signed by:   Judithann Sheen MD on 02/22/2010   Method used:   Print then Give to Patient   RxID:   [id number] ALPRAZOLAM 0.25 MG  TABS (ALPRAZOLAM) 1 three timesw as needed for stress  #90 x 5   Entered and Authorized by:   Judithann Sheen MD   Signed by:   Judithann Sheen MD on 02/22/2010   Method used:   Print then Give to Patient   RxID:   [id number] TEMAZEPAM 30 MG  CAPS (TEMAZEPAM) 1 bedtime for sleep  #30 x 5   Entered and Authorized by:   Judithann Sheen MD   Signed by:   Judithann Sheen MD on 02/22/2010   Method used:   Print then Give to Patient   RxID:   [id number] LYRICA 50 MG CAPS (PREGABALIN) 1 two times a day  #60 x 11   Entered and Authorized by:   Judithann Sheen MD   Signed by:   Judithann Sheen MD on 02/22/2010   Method used:   Print then Give to Patient   RxID:   [id number]

## 2011-01-31 NOTE — Assessment & Plan Note (Signed)
Summary: med check//[facility] appt/njr   Vital Signs:  Patient profile:   77 year old female Weight:      136 pounds O2 Sat:      98 % Temp:     97.7 degrees F Pulse rate:   100 / minute Pulse rhythm:   regular BP sitting:   120 / 80  (left arm)  Vitals Entered By: Pura Spice, RN (August 21, 2010 9:03 AM) CC: refills    History of Present Illness: This 77 year old white widowed female is in today for refill of her medications and to discuss some of her medical problems. 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 Ilean China for epidural injection. He provide her oxycodone for chronic pain patient has narcolepsy and is in for prescriptions of amphetamine Needs a refill of her Cortisporin Otic solution for recurrent external otitis  Allergies: 1)  ! Sulfa  Past History:  Past Medical History: Last updated: 11/28/2009 Osteoarthritis Osteoporosis Pneumonia, hx of Chronic pain with narcotic management Hyperlipidemia Hypertension chronic back pain degenerative joint disease  Past Surgical History: Last updated: 07/08/2007 Appendectomy Hysterectomy  Risk Factors: Smoking Status: quit (08/02/2008)  Review of Systems      See HPI  The patient denies anorexia, fever, weight loss, weight gain, vision loss, decreased hearing, hoarseness, chest pain, syncope, dyspnea on exertion, peripheral edema, prolonged cough, headaches, hemoptysis, abdominal pain, melena, hematochezia, severe indigestion/heartburn, hematuria, incontinence, genital sores, muscle weakness, suspicious skin lesions, transient blindness, difficulty walking, depression, unusual weight change, abnormal bleeding, enlarged lymph nodes, angioedema, breast  masses, and testicular masses.    Physical Exam  General:  Well-developed,well-nourished,in no acute distress; alert,appropriate and cooperative throughout examination Head:  Normocephalic and atraumatic without obvious abnormalities. No apparent alopecia or balding. Eyes:  No corneal or conjunctival inflammation noted. EOMI. Perrla. Funduscopic exam benign, without hemorrhages, exudates or papilledema. Vision grossly normal. Ears:  External ear exam shows no significant lesions or deformities.  Otoscopic examination reveals clear canals, tympanic membranes are intact bilaterally without bulging, retraction, inflammation or discharge. Hearing is grossly normal bilaterally. Nose:  External nasal examination shows no deformity or inflammation. Nasal mucosa are pink and moist without lesions or exudates. Mouth:  Oral mucosa and oropharynx without lesions or exudates.  Teeth in good repair. Chest Wall:  No deformities, masses, or tenderness noted. Breasts:  not examined Lungs:  Normal respiratory effort, chest expands symmetrically. Lungs are clear to auscultation, no crackles or wheezes. Heart:  Normal rate and regular rhythm. S1 and S2 normal without gallop, murmur, click, rub or other extra sounds. Abdomen:  Bowel sounds positive,abdomen soft and non-tender without masses, organomegaly or hernias noted. Msk:  No deformity or scoliosis noted of thoracic or lumbar spine.   Extremities:  No clubbing, cyanosis, edema, or deformity noted with normal full range of motion of all joints.   Neurologic:  No cranial nerve deficits noted. Station and gait are normal. Plantar reflexes are down-going bilaterally. DTRs are symmetrical throughout. Sensory, motor and coordinative functions appear intact.   Impression & Recommendations:  Problem # 1:  POSTMENOPAUSAL SYNDROME (ICD-627.9) Assessment Improved  The following medications were removed from the medication list:    Estradiol 0.5 Mg Tabs (Estradiol)  .Marland KitchenMarland KitchenMarland KitchenMarland Kitchen 1  qd  Problem # 2:  MEMORY LOSS (ICD-780.93) Assessment: Improved paraseptal milligrams q.d.  Problem # 3:  LOW BACK PAIN SYNDROME (ICD-724.2) Assessment: Unchanged  Her updated medication list for this problem includes:    Oxycodone Hcl 30 Mg Tabs (Oxycodone hcl) .Marland Kitchen... Dr Ethelene Hal    Diclofenac Sodium 75 Mg Tbec (Diclofenac sodium) .Marland Kitchen... 1 two times a day for inflammation    Tramadol Hcl 50 Mg Tabs (Tramadol hcl) .Marland Kitchen... 1 or 2 every 4-6 hrs as needed pain.    Fentanyl 100 Mcg/hr Pt72 (Fentanyl) .Marland Kitchen... Dr Ethelene Hal    Flexeril 10 Mg Tabs (Cyclobenzaprine hcl) .Marland Kitchen... Dr Ethelene Hal  Problem # 4:  PERIPHERAL NEUROPATHY, LOWER EXTREMITY, RIGHT (ICD-355.8) Assessment: Improved Lyrica 50 mg t.i.d.  Problem # 5:  BACK PAIN, LUMBAR, CHRONIC (ICD-724.2) Assessment: Unchanged  Her updated medication list for this problem includes:    Oxycodone Hcl 30 Mg Tabs (Oxycodone hcl) .Marland Kitchen... Dr Ethelene Hal    Diclofenac Sodium 75 Mg Tbec (Diclofenac sodium) .Marland Kitchen... 1 two times a day for inflammation    Tramadol Hcl 50 Mg Tabs (Tramadol hcl) .Marland Kitchen... 1 or 2 every 4-6 hrs as needed pain.    Fentanyl 100 Mcg/hr Pt72 (Fentanyl) .Marland Kitchen... Dr Ethelene Hal    Flexeril 10 Mg Tabs (Cyclobenzaprine hcl) .Marland Kitchen... 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 and mid afternoon  Problem # 7:  OSTEOARTHRITIS (ICD-715.90) Assessment: Unchanged  Her updated medication list for this problem includes:    Oxycodone Hcl 30 Mg Tabs (Oxycodone hcl) .Marland Kitchen... Dr Ethelene Hal    Diclofenac Sodium 75 Mg Tbec (Diclofenac sodium) .Marland Kitchen... 1 two times a day for inflammation    Tramadol Hcl 50 Mg Tabs (Tramadol hcl) .Marland Kitchen... 1 or 2 every 4-6 hrs as needed pain.    Fentanyl 100 Mcg/hr Pt72 (Fentanyl) .Marland Kitchen... Dr Ethelene Hal  Complete Medication List: 1)  Prochlorperazine Maleate 10 Mg Tabs (Prochlorperazine maleate) .Marland Kitchen.. 1 tablet every 6 hrs as needed nausea 2)  Lonox 2.5-0.025 Mg Tabs (Diphenoxylate-atropine) ....  Take 2 tabs four times a day and at bedtime  as needed diarrhea 3)  Temazepam 30 Mg Caps (Temazepam) .Marland Kitchen.. 1 bedtime for sleep 4)  Alprazolam 0.25 Mg Tabs (Alprazolam) .Marland Kitchen.. 1 three timesw as needed for stress 5)  Aricept 10 Mg Tabs (Donepezil hcl) .Marland Kitchen.. 1 once daily 6)  Levbid 0.375 Mg Tb12 (Hyoscyamine sulfate) .Marland Kitchen.. 1 two times a day fir ibs 7)  Oxycodone Hcl 30 Mg Tabs (Oxycodone hcl) .... Dr Ethelene Hal 8)  Diclofenac Sodium 75 Mg Tbec (Diclofenac sodium) .Marland Kitchen.. 1 two times a day for inflammation 9)  Tramadol Hcl 50 Mg Tabs (Tramadol hcl) .Marland Kitchen.. 1 or 2 every 4-6 hrs as needed pain. 10)  Fentanyl 100 Mcg/hr Pt72 (Fentanyl) .... Dr Ethelene Hal 11)  Vitamin D (ergocalciferol) 50000 Unit Caps (Ergocalciferol) .... Dr Petra Kuba 12)  Cortisporin 3.5-10000-1 Soln (Neomycin-polymyxin-hc) .... W qtts  in ear three times a day for external otitis 13)  Flexeril 10 Mg Tabs (Cyclobenzaprine hcl) .... Dr Ethelene Hal 14)  Lyrica 50 Mg Caps (Pregabalin) .Marland Kitchen.. 1 two times a day 15)  Amphetamine-dextroamphetamine 15 Mg Tabs (Amphetamine-dextroamphetamine) .Marland Kitchen.. 1 am and midafternoon for narcolepsay fill on 3 nov 16)  Amphetamine-dextroamphetamine 15 Mg Tabs (Amphetamine-dextroamphetamine) .Marland Kitchen.. 1 am and misdafternoon, fill 3 oct 17)  Amphetamine-dextroamphetamine 15 Mg Tabs (Amphetamine-dextroamphetamine) .Marland Kitchen.. 1 am and midahfternoon, fill on 3 dec  Patient Instructions: 1)  have refilled her medication 2)  Continue medications as prescribed Prescriptions: DICLOFENAC SODIUM 75  MG TBEC (DICLOFENAC SODIUM) 1 two times a day for inflammation  #60 x 11   Entered and Authorized by:   Judithann Sheen MD   Signed by:   Judithann Sheen MD on 08/21/2010   Method used:   Print then Give to Patient   RxID:   [id number] LEVBID 0.375 MG  TB12 (HYOSCYAMINE SULFATE) 1 two times a day fir IBS  #60 x 11   Entered and Authorized by:   Judithann Sheen MD   Signed by:   Judithann Sheen MD on 08/21/2010   Method used:   Print  then Give to Patient   RxID:   [id number] ARICEPT 10 MG  TABS (DONEPEZIL HCL) 1 once daily  #30 x 11   Entered and Authorized by:   Judithann Sheen MD   Signed by:   Judithann Sheen MD on 08/21/2010   Method used:   Print then Give to Patient   RxID:   [id number] LONOX 2.5-0.025 MG  TABS (DIPHENOXYLATE-ATROPINE) take 2 tabs four times a day and at bedtime  as needed diarrhea  #100 x 5   Entered and Authorized by:   Judithann Sheen MD   Signed by:   Judithann Sheen MD on 08/21/2010   Method used:   Print then Give to Patient   RxID:   [id number] AMPHETAMINE-DEXTROAMPHETAMINE 15 MG TABS (AMPHETAMINE-DEXTROAMPHETAMINE) 1 AM and midahfternoon, fill on 3 Dec  #60 x 0   Entered and Authorized by:   Judithann Sheen MD   Signed by:   Judithann Sheen MD on 08/21/2010   Method used:   Print then Give to Patient   RxID:   [id number] AMPHETAMINE-DEXTROAMPHETAMINE 15 MG TABS (AMPHETAMINE-DEXTROAMPHETAMINE) 1 AM and midafternoon for narcolepsay fill on 3 Nov  #60 x 0   Entered and Authorized by:   Judithann Sheen MD   Signed by:   Judithann Sheen MD on 08/21/2010   Method used:   Print then Give to Patient   RxID:   [id number] AMPHETAMINE-DEXTROAMPHETAMINE 15 MG TABS (AMPHETAMINE-DEXTROAMPHETAMINE) 1 AM and misdafternoon, fill 3 Oct  #60 x 0   Entered and Authorized by:   Judithann Sheen MD   Signed by:   Judithann Sheen MD on 08/21/2010   Method used:   Print then Give to Patient   RxID:   [id number]

## 2011-01-31 NOTE — Assessment & Plan Note (Signed)
Summary: MEDICATION CK // RS   Vital Signs:  Patient profile:   77 year old female Weight:      135 pounds O2 Sat:      90 % Temp:     98.7 degrees F Pulse rate:   100 / minute BP sitting:   130 / 72  (left arm)  Vitals Entered By: Pura Spice, RN (June 26, 2010 9:16 AM) CC: refill dexadrine on 3rd month needs refills temazepam tramodol dexadrine and cortisporin   History of Present Illness: This 77 year old white widow is in today to have her medication refilled she has quite a problem with narcolepsy and needs her dextroamphetamine refill she also needs her temazepam alprazolam and tramadol. She continues to complain of her back pain which she is seen in Dr. Alycia Rossetti was for epidural injection Blood pressure is well controlled The only other complaint is that of pain in the right ear canal which she relates is from earlier, that she has had previously  Allergies: 1)  ! Sulfa  Past History:  Past Medical History: Last updated: 11/28/2009 Osteoarthritis Osteoporosis Pneumonia, hx of Chronic pain with narcotic management Hyperlipidemia Hypertension chronic back pain degenerative joint disease  Past Surgical History: Last updated: 07/08/2007 Appendectomy Hysterectomy  Risk Factors: Smoking Status: quit (08/02/2008)  Review of Systems      See HPI  The patient denies anorexia, fever, weight loss, weight gain, vision loss, decreased hearing, hoarseness, chest pain, syncope, dyspnea on exertion, peripheral edema, prolonged cough, headaches, hemoptysis, abdominal pain, melena, hematochezia, severe indigestion/heartburn, hematuria, incontinence, genital sores, muscle weakness, suspicious skin lesions, transient blindness, difficulty walking, depression, unusual weight change, abnormal bleeding, enlarged lymph nodes, angioedema, breast masses, and testicular masses.    Physical Exam  General:  Well-developed,well-nourished,in no acute distress; alert,appropriate and  cooperative throughout examination Ears:  irrtated area  in rt canal, left canal neg Lungs:  Normal respiratory effort, chest expands symmetrically. Lungs are clear to auscultation, no crackles or wheezes. Heart:  Normal rate and regular rhythm. S1 and S2 normal without gallop, murmur, click, rub or other extra sounds. Msk:  Back stiff, obvious pain on standing and walkimg Extremities:  No clubbing, cyanosis, edema, or deformity noted with normal full range of motion of all joints.     Impression & Recommendations:  Problem # 1:  LOW BACK PAIN SYNDROME (ICD-724.2) Assessment Unchanged  Her updated medication list for this problem includes:    Oxycodone Hcl 30 Mg Tabs (Oxycodone hcl) .Marland Kitchen... Dr Ethelene Hal    Diclofenac Sodium 75 Mg Tbec (Diclofenac sodium) .Marland Kitchen... 1 two times a day for inflammation    Tramadol Hcl 50 Mg Tabs (Tramadol hcl) .Marland Kitchen... 1 or 2 every 4-6 hrs as needed pain.    Fentanyl 100 Mcg/hr Pt72 (Fentanyl) .Marland Kitchen... Dr Ethelene Hal    Flexeril 10 Mg Tabs (Cyclobenzaprine hcl) .Marland Kitchen... Dr Ethelene Hal  Problem # 2:  BACK PAIN, LUMBAR, CHRONIC (ICD-724.2) Assessment: Unchanged  Her updated medication list for this problem includes:    Oxycodone Hcl 30 Mg Tabs (Oxycodone hcl) .Marland Kitchen... Dr Ethelene Hal    Diclofenac Sodium 75 Mg Tbec (Diclofenac sodium) .Marland Kitchen... 1 two times a day for inflammation    Tramadol Hcl 50 Mg Tabs (Tramadol hcl) .Marland Kitchen... 1 or 2 every 4-6 hrs as needed pain.    Fentanyl 100 Mcg/hr Pt72 (Fentanyl) .Marland Kitchen... Dr Ethelene Hal    Flexeril 10 Mg Tabs (Cyclobenzaprine hcl) .Marland Kitchen... Dr Ethelene Hal  Problem # 3:  EXTERNAL OTITIS (ICD-380.10) Assessment: New  Her updated medication list  for this problem includes:    Cortisporin 3.5-10000-1 Soln (Neomycin-polymyxin-hc) .Marland Kitchen... W qtts  in ear three times a day for external otitis  Orders: Prescription Created Electronically [phone])  Problem # 4:  MEMORY LOSS (ICD-780.93) Assessment: Improved aricept 10 mg qd  Problem # 5:  INSOMNIA-SLEEP DISORDER-UNSPEC  (ICD-780.52) Assessment: Improved  Her updated medication list for this problem includes:    Temazepam 30 Mg Caps (Temazepam) .Marland Kitchen... 1 bedtime for sleep  Problem # 6:  NARCOLEPSY WITHOUT CATAPLEXY (ICD-347.00) Assessment: Unchanged  Problem # 7:  HYPERTENSION (ICD-401.9) Assessment: Improved  Problem # 8:  IBS (ICD-564.1) Assessment: Unchanged Lomotil one-2 q.i.d. a.c. h.s. for diarrhea  Complete Medication List: 1)  Prochlorperazine Maleate 10 Mg Tabs (Prochlorperazine maleate) .Marland Kitchen.. 1 tablet every 6 hrs as needed nausea 2)  Lonox 2.5-0.025 Mg Tabs (Diphenoxylate-atropine) .... Take 2 tabs four times a day and at bedtime  as needed diarrhea 3)  Temazepam 30 Mg Caps (Temazepam) .Marland Kitchen.. 1 bedtime for sleep 4)  Alprazolam 0.25 Mg Tabs (Alprazolam) .Marland Kitchen.. 1 three timesw as needed for stress 5)  Aricept 10 Mg Tabs (Donepezil hcl) .Marland Kitchen.. 1 once daily 6)  Levbid 0.375 Mg Tb12 (Hyoscyamine sulfate) .Marland Kitchen.. 1 two times a day fir ibs 7)  Oxycodone Hcl 30 Mg Tabs (Oxycodone hcl) .... Dr Ethelene Hal 8)  Hyoscyamine Sulfate Cr 0.375 Mg Xr12h-tab (Hyoscyamine sulfate) .Marland Kitchen.. 1 by mouth two times a day 9)  Diclofenac Sodium 75 Mg Tbec (Diclofenac sodium) .Marland Kitchen.. 1 two times a day for inflammation 10)  Tramadol Hcl 50 Mg Tabs (Tramadol hcl) .Marland Kitchen.. 1 or 2 every 4-6 hrs as needed pain. 11)  Fentanyl 100 Mcg/hr Pt72 (Fentanyl) .... Dr Ethelene Hal 12)  Vitamin D (ergocalciferol) 50000 Unit Caps (Ergocalciferol) .... Dr Petra Kuba 13)  Cortisporin 3.5-10000-1 Soln (Neomycin-polymyxin-hc) .... W qtts  in ear three times a day for external otitis 14)  Estradiol 0.5 Mg Tabs (Estradiol) .Marland Kitchen.. 1 qd 15)  Flexeril 10 Mg Tabs (Cyclobenzaprine hcl) .... Dr Ethelene Hal 16)  Lyrica 50 Mg Caps (Pregabalin) .Marland Kitchen.. 1 two times a day 17)  Amphetamine-dextroamphetamine 15 Mg Xr24h-cap (Amphetamine-dextroamphetamine) .Marland Kitchen.. 1 by mouth am and 1 by mouth mid afternoon fill May 11 2010 18)  Amphetamine-dextroamphetamine 15 Mg Tabs (Amphetamine-dextroamphetamine)  .Marland Kitchen.. 1 am and midafternoon for narcolepsay fill on3 july 19)  Amphetamine-dextroamphetamine 15 Mg Tabs (Amphetamine-dextroamphetamine) .Marland Kitchen.. 1 am and misdafternoon, fill 3 aug 20)  Amphetamine-dextroamphetamine 15 Mg Tabs (Amphetamine-dextroamphetamine) .Marland Kitchen.. 1 am and midahfternoon, fill on 3 sept  Patient Instructions: 1)  narcolepsy continues to be problem desires larger dosage but must stay witn 30 mg AM and midafternoon 2)  refilled temazepam, alprazolam and cortisporin otic solution 3)  continue other medications 4)  refill Cortisporin on exhalation for external otitis Prescriptions: CORTISPORIN 3.5-10000-1 SOLN (NEOMYCIN-POLYMYXIN-HC) w qtts  in ear three times a day for external otitis  #5.0cc x 11   Entered and Authorized by:   Judithann Sheen MD   Signed by:   Judithann Sheen MD on 06/26/2010   Method used:   Electronically to        [facility]* (retail)       [address] [place], [place]  [address]       Ph: [phone] or [phone]       Fax: [phone]   RxID:   [id number] TRAMADOL HCL 50 MG TABS (TRAMADOL HCL) 1 or 2 every 4-6 hrs as needed pain.  #100 x 5  Entered and Authorized by:   Judithann Sheen MD   Signed by:   Judithann Sheen MD on 06/26/2010   Method used:   Electronically to        [facility]* (retail)       [address] [place], [place]  [address]       Ph: [phone] or [phone]       Fax: [phone]   RxID:   [id number] ALPRAZOLAM 0.25 MG  TABS (ALPRAZOLAM) 1 three timesw as needed for stress  #90 x 5   Entered and Authorized by:   Judithann Sheen MD   Signed by:   Judithann Sheen MD on 06/26/2010   Method used:   Print then Mail to Patient   RxID:   [id number] TEMAZEPAM 30 MG  CAPS (TEMAZEPAM) 1 bedtime for sleep  #30 x 5   Entered and Authorized by:   Judithann Sheen MD   Signed by:   Judithann Sheen MD on 06/26/2010   Method used:   Print then Mail to  Patient   RxID:   [id number] AMPHETAMINE-DEXTROAMPHETAMINE 15 MG TABS (AMPHETAMINE-DEXTROAMPHETAMINE) 1 AM and midahfternoon, fill on 3 Sept  #60 x 0   Entered and Authorized by:   Judithann Sheen MD   Signed by:   Judithann Sheen MD on 06/26/2010   Method used:   Print then Mail to Patient   RxID:   [id number] AMPHETAMINE-DEXTROAMPHETAMINE 15 MG TABS (AMPHETAMINE-DEXTROAMPHETAMINE) 1 AM and misdafternoon, fill 3 Aug  #60 x 0   Entered and Authorized by:   Judithann Sheen MD   Signed by:   Judithann Sheen MD on 06/26/2010   Method used:   Print then Mail to Patient   RxID:   [id number] AMPHETAMINE-DEXTROAMPHETAMINE 15 MG TABS (AMPHETAMINE-DEXTROAMPHETAMINE) 1 AM and midafternoon for narcolepsay fill on3 July  #60 x 0   Entered and Authorized by:   Judithann Sheen MD   Signed by:   Judithann Sheen MD on 06/26/2010   Method used:   Print then Mail to Patient   RxID:   [id number]

## 2011-01-31 NOTE — Progress Notes (Signed)
Summary: [facility]  Phone Note Refill Request Call back at Home Phone [phone] Message from:  Patient  Refills Requested: Medication #1:  ALPRAZOLAM 0.25 MG  TABS 1 three timesw as needed for stress [facility] [phone]. Pt has only 3 pills left  Initial call taken by: Heron Sabins,  January 11, 2011 8:41 AM  Follow-up for Phone Call        Rx called to pharmacy Follow-up by: Alfred Levins, CMA,  January 11, 2011 2:28 PM    Prescriptions: ALPRAZOLAM 0.25 MG  TABS (ALPRAZOLAM) 1 three timesw as needed for stress  #90 x 5   Entered by:   Alfred Levins, CMA   Authorized by:   Judithann Sheen MD   Signed by:   Alfred Levins, CMA on 01/11/2011   Method used:   Telephoned to ...       [facility] (retail)       [address] [place], [place]  [address]       Ph: [phone] or [phone]       Fax: [phone]   RxID:   [id number]

## 2011-03-01 ENCOUNTER — Telehealth: Payer: Self-pay | Admitting: Family Medicine

## 2011-03-01 NOTE — Telephone Encounter (Signed)
Pt called in to request a refill on med: amphetamine for narcolepsy...Marland KitchenMarland Kitchen Pt can be reached at [phone].... Pt has appt on 4/10 with Dr Scotty Court but wants to come in sooner, can you advise date / time / okay for pt to be worked into schedule for medication review?

## 2011-03-05 ENCOUNTER — Telehealth: Payer: Self-pay | Admitting: Family Medicine

## 2011-03-05 NOTE — Telephone Encounter (Signed)
Pt called to ck on status of previous call.... Telephone note.

## 2011-03-07 ENCOUNTER — Telehealth: Payer: Self-pay | Admitting: Family Medicine

## 2011-03-07 NOTE — Telephone Encounter (Signed)
Pt called to request that a Rx be prepared for her to come by and p/u so she can have the medication (dexadrine-aphetamine) for narcolepsy.... Pt request written Rx so she can come by and pick it up... Pt can be reached at [phone].

## 2011-03-07 NOTE — Telephone Encounter (Signed)
°  Pt has medication review appt on 4/10 with Dr Scotty Court.... Requesting Rx asap due to currenlty being out of medication // rs

## 2011-03-12 ENCOUNTER — Other Ambulatory Visit: Payer: Self-pay | Admitting: Family Medicine

## 2011-03-12 ENCOUNTER — Telehealth: Payer: Self-pay | Admitting: Family Medicine

## 2011-03-12 MED ORDER — AMPHETAMINE-DEXTROAMPHETAMINE 15 MG PO TABS: 15.0000 mg | ORAL_TABLET | Freq: Every day | ORAL | Status: AC

## 2011-03-12 MED ORDER — AMPHETAMINE-DEXTROAMPHETAMINE 15 MG PO TABS: 15.0000 mg | ORAL_TABLET | Freq: Every day | ORAL | Status: DC

## 2011-03-12 NOTE — Telephone Encounter (Signed)
Called and told pt rx ready for pick-up at front desk.

## 2011-03-12 NOTE — Telephone Encounter (Signed)
Called pt to let know prescriptions are ready for pick up

## 2011-03-15 ENCOUNTER — Encounter: Payer: Self-pay | Admitting: Family Medicine

## 2011-04-09 ENCOUNTER — Encounter: Payer: Self-pay | Admitting: Family Medicine

## 2011-04-09 ENCOUNTER — Ambulatory Visit (INDEPENDENT_AMBULATORY_CARE_PROVIDER_SITE_OTHER): Payer: Medicare Other | Admitting: Family Medicine

## 2011-04-09 VITALS — BP 122/72 | HR 102 | Temp 98.0°F | Wt 137.0 lb

## 2011-04-09 DIAGNOSIS — F341 Dysthymic disorder: Secondary | ICD-10-CM

## 2011-04-09 DIAGNOSIS — M549 Dorsalgia, unspecified: Secondary | ICD-10-CM

## 2011-04-09 DIAGNOSIS — IMO0001 Reserved for inherently not codable concepts without codable children: Secondary | ICD-10-CM

## 2011-04-09 DIAGNOSIS — N390 Urinary tract infection, site not specified: Secondary | ICD-10-CM

## 2011-04-09 DIAGNOSIS — F329 Major depressive disorder, single episode, unspecified: Secondary | ICD-10-CM

## 2011-04-09 DIAGNOSIS — R350 Frequency of micturition: Secondary | ICD-10-CM

## 2011-04-09 DIAGNOSIS — G4700 Insomnia, unspecified: Secondary | ICD-10-CM

## 2011-04-09 DIAGNOSIS — G8929 Other chronic pain: Secondary | ICD-10-CM

## 2011-04-09 DIAGNOSIS — G47419 Narcolepsy without cataplexy: Secondary | ICD-10-CM

## 2011-04-09 LAB — POCT I-STAT, CHEM 8
BUN: 19 mg/dL (ref 6–23)
Calcium, Ion: 1.18 mmol/L (ref 1.12–1.32)
Chloride: 100 mEq/L (ref 96–112)
HCT: 43 % (ref 36.0–46.0)
Sodium: 137 mEq/L (ref 135–145)
TCO2: 30 mmol/L (ref 0–100)

## 2011-04-09 LAB — CBC
HCT: 41.1 % (ref 36.0–46.0)
Hemoglobin: 13.7 g/dL (ref 12.0–15.0)
MCHC: 33.4 g/dL (ref 30.0–36.0)
MCHC: 34 g/dL (ref 30.0–36.0)
MCV: 86.4 fL (ref 78.0–100.0)
MCV: 87.1 fL (ref 78.0–100.0)
Platelets: 250 10*3/uL (ref 150–400)
RBC: 4.71 MIL/uL (ref 3.87–5.11)
RDW: 12.6 % (ref 11.5–15.5)
WBC: 6.9 10*3/uL (ref 4.0–10.5)

## 2011-04-09 LAB — BASIC METABOLIC PANEL
BUN: 12 mg/dL (ref 6–23)
CO2: 28 mEq/L (ref 19–32)
Calcium: 9 mg/dL (ref 8.4–10.5)
Creatinine, Ser: 0.69 mg/dL (ref 0.4–1.2)
GFR calc non Af Amer: >60 mL/min (ref >60)
Glucose, Bld: 94 mg/dL (ref 70–99)
Sodium: 141 mEq/L (ref 135–145)

## 2011-04-09 LAB — COMPREHENSIVE METABOLIC PANEL
BUN: 15 mg/dL (ref 6–23)
CO2: 33 mEq/L — ABNORMAL HIGH (ref 19–32)
Calcium: 9.3 mg/dL (ref 8.4–10.5)
Chloride: 101 mEq/L (ref 96–112)
Creatinine, Ser: 0.6 mg/dL (ref 0.4–1.2)
GFR calc non Af Amer: >60 mL/min (ref >60)
Glucose, Bld: 101 mg/dL — ABNORMAL HIGH (ref 70–99)
Total Bilirubin: 0.4 mg/dL (ref 0.3–1.2)

## 2011-04-09 LAB — LIPASE, BLOOD: Lipase: 17 U/L (ref 11–59)

## 2011-04-09 LAB — POCT URINALYSIS DIP (DEVICE)
Bilirubin Urine: NEGATIVE
Glucose, UA: NEGATIVE mg/dL
Hgb urine dipstick: NEGATIVE
Ketones, ur: NEGATIVE mg/dL
Specific Gravity, Urine: 1.025 (ref 1.005–1.030)
Urobilinogen, UA: 0.2 mg/dL (ref 0.0–1.0)

## 2011-04-09 LAB — URINALYSIS, ROUTINE W REFLEX MICROSCOPIC
Bilirubin Urine: NEGATIVE
Ketones, ur: NEGATIVE mg/dL
Nitrite: NEGATIVE
Urobilinogen, UA: 0.2 mg/dL (ref 0.0–1.0)
pH: 7 (ref 5.0–8.0)

## 2011-04-09 LAB — SEDIMENTATION RATE: Sed Rate: 8 mm/hr (ref 0–22)

## 2011-04-09 LAB — POCT URINALYSIS DIPSTICK
Glucose, UA: NEGATIVE
Spec Grav, UA: 1.03
Urobilinogen, UA: 0.2

## 2011-04-09 LAB — DIFFERENTIAL
Basophils Absolute: 0 10*3/uL (ref 0.0–0.1)
Eosinophils Absolute: 0.5 10*3/uL (ref 0.0–0.7)
Eosinophils Relative: 7 % — ABNORMAL HIGH (ref 0–5)
Lymphocytes Relative: 34 % (ref 12–46)
Lymphs Abs: 2.3 10*3/uL (ref 0.7–4.0)
Neutrophils Relative %: 49 % (ref 43–77)

## 2011-04-09 MED ORDER — AMPHETAMINE-DEXTROAMPHETAMINE 15 MG PO TABS: 15.0000 mg | ORAL_TABLET | Freq: Every day | ORAL | Status: DC

## 2011-04-09 MED ORDER — CIPROFLOXACIN HCL 500 MG PO TABS: 500.0000 mg | ORAL_TABLET | Freq: Two times a day (BID) | ORAL | Status: AC

## 2011-04-09 NOTE — Patient Instructions (Addendum)
You have urinary tract infection and will treat with cipro 500 mg twice daily Increase fluid intake Continue other medications

## 2011-04-10 ENCOUNTER — Telehealth: Payer: Self-pay | Admitting: *Deleted

## 2011-04-10 NOTE — Telephone Encounter (Signed)
Spoke with kim at [facility] rx not received but was verbally given to her for cipro  Pt aware.

## 2011-04-10 NOTE — Telephone Encounter (Signed)
Pt did not get meds for UTI yesterday and wants to start her antibiotics.  Please call her to get this straight. She has called x 2.

## 2011-04-15 ENCOUNTER — Encounter: Payer: Self-pay | Admitting: Family Medicine

## 2011-04-15 NOTE — Progress Notes (Signed)
  Subjective:    Patient ID: Angelica Bell, female    DOB: February 05, 1932, 77 y.o.   MRN: [id number] This 77 year old white widoq is in complaining of urinary frequency burning or dysuria urgency small amounts of urine. She also complains of fatigue but is her chronic component patient continues to be under the care of Dr. Algis Greenhouse is for her pain control. Desire for narcolepsy medication to be refill also would like to refill her alprazolam for chronic anxiety Urinalysis was done was negative She continues to complain of peripheral neuropathy of the lower extremities as well as stated above for narcolepsy chronic anxiety depression Irritable bowel syndrome was done under fairly good control Continues to take Aricept for that to dementia Continues to need a refill on temazepam for her chronic insomnia she is continued on aspirin level on him and a vitamin B complex HPI    Review of Systems C. History of present illness    Objective:   Physical Exam the patient is well-developed well-nourished white female who is very talkative but pleasant Heart lungs clear abdomen negative and kidney nonpalpable bowel sounds normal examination of the spine reveal anterior position when she walks decreasesensory sensations of lower extremities especially the right Pelvic examination not done but examination the suprapubic line is revealed markedly tender with no mass       Assessment & Plan:   despite the negative urine specimen physical examination well as total symptoms revealed the patient has acute cystitis or urinary tract infection 2 treat with Cipro 500 mg b.i.d. Anxiety and depression persist to refill medications Insomnia persists to resume  Temazepam Narcolepsy persist to refill her amphetamine or Adderall

## 2011-04-15 NOTE — Progress Notes (Signed)
Pt aware.

## 2011-05-14 NOTE — H&P (Signed)
NAME:  Angelica Bell, Angelica Bell NO.:  [id number]   MEDICAL RECORD NO.:  [id number]          PATIENT TYPE:  INP   LOCATION:  1830                         FACILITY:  [facility]   PHYSICIAN:  Darryl D. Prime, MD    DATE OF BIRTH:  Nov 25, 1932   DATE OF ADMISSION:  05/06/2009  DATE OF DISCHARGE:                              HISTORY & PHYSICAL   PRIMARY CARE PHYSICIAN:  Tawny Asal, M.D. of [facility].   CHIEF COMPLAINT:  Pain.   HISTORY OF PRESENT ILLNESS:  Angelica Bell is a 77 year old female with a  history of degenerative joint disease and irritable bowel syndrome,  intractable hip pain, history of possible spinal stenosis, history of  osteoporosis, a history of levoscoliosis who notes gradual onset of  right lower quadrant abdominal pain yesterday evening that has been  constant ever since, sharp with no radiation.  The pain is severe,  10/10, which relieved somewhat when she lays on her left side.  The  patient denies any recent diarrhea, constipation, nausea, vomiting,  fever, dysuria or hematuria.  She notes no relation to eating.  The  patient has been walking okay.  There was no worsening of the pain with  walking.  The patient denies any numbness or weakness in the lower  extremities.  The patient in the emergency room was evaluated for this  and had a CT of the abdomen and pelvis which was negative for any acute  disease.  She did have sigmoid diverticulosis.  The patient is status  post appendectomy.  She was given morphine, Zofran and Dilaudid which  made the pain somewhat better, but notes the pain is still in the range  of 10/10.   PAST MEDICAL/SURGICAL HISTORY:  As above.  1. History of pneumonia.  2. History of urinary tract infection.  3. History of possible rheumatoid arthritis.  4. History of chronic narcotic use.  5. Status post appendectomy.  6. Status post hysterectomy, not for cancer.  7. Status post tonsillectomy and  adenoidectomy.  8. History of narcolepsy.   ALLERGIES:  SULFA DRUGS which cause hives.   MEDICATIONS:  She is unsure of the medications that she is on.  1. She knows she is on 100 mg of fentanyl every 2 days, last dose      yesterday, May 05, 2009.  2. Per old chart she is apparently on temazepam, unsure of the dose,  3. Multivitamin daily.  4. Oxycodone 30 mg as needed for pain.  5. Alprazolam 0.25 mg as needed for anxiety.  6. Dexedrine as needed because she has a history apparently of      narcolepsy.   SOCIAL HISTORY:  The patient had been married three times,  three times  widowed.  No tobacco, alcohol or drug use.  She quit tobacco in 1979.   FAMILY HISTORY:  The patient's family history is negative for premature  coronary artery disease.   REVIEW OF SYSTEMS:  An 11-point review of systems is negative unless  stated above.   PHYSICAL EXAMINATION:  VITAL SIGNS:  Temperature is 98 with  a blood  pressure 132/79, pulse of 56, respirations 14, oxygen saturation 98% on  room air.  GENERAL:  The patient is a female that looks her stated age, lying in  bed in no acute distress.  HEENT:  Normocephalic, atraumatic.  Pupils equal, round and reactive to  light.  Extraocular movements being intact.  The oropharynx is dry.  NECK:  Supple with no lymphadenopathy or thyromegaly.  No carotid  bruits.  No jugular venous distention.  LUNGS:  Clear to auscultation  bilaterally.  CARDIOVASCULAR:  Regular rhythm and rate with no murmurs.  ABDOMEN:  Soft.  There is tenderness in the right lower quadrant on deep  palpation.  There is no sign of rebound tenderness.  No sign of an acute  abdomen.  No involuntary guarding.  The patient does no signs of  hepatosplenomegaly.  Normoactive bowel sounds.  EXTREMITIES:  No clubbing, cyanosis or edema.  NEUROLOGIC/MUSCULOSKELETAL:  There are no signs of tenderness over the  sacrum, lumbar, or thoracic spine.  Strength is 5/5 throughout.  Sensation is  intact throughout.  Gait:  She does walk with a bend and a  cane but otherwise unremarkable.  PULSES:  2+ dorsalis pedis, posterior tibial and radial and they are  symmetric.  SKIN:  No rashes.   LABORATORY DATA:  Cardiac markers at 1936 were negative.  Sodium 139,  potassium of 3.8, chloride 101, CO2 of 33, BUN 15, creatinine 0.6,  glucose 101; otherwise normal liver function tests.  Lipase is 17.  Urinalysis negative.  White count of 6.9 with hemoglobin of 16.7,  hematocrit 41.1, platelets of 293, segs of 49.  CT of the abdomen is as  above.   ASSESSMENT/PLAN:  This is a patient with a history of chronic pain, now  presents with right lower quadrant abdominal pain of unclear etiology.  The differential in the diagnosis could be ischemic colitis with  irritable bowel syndrome or radiation from musculoskeletal disease of  the spine or hip facet versus a hernia which is unlikely given the CT  scan did not show much versus gallbladder disease.  The patient at this  time will  be admitted and will check a lactic acid, will control her  pain and nausea with antiemetics and analgesics.  Will get an MRI of the  thoracic and lumbar spine to evaluate for possible referred pain as she  does have a significant history of degenerative joint disease in that  area as a good starting point for her work-up.  DVT and GI prophylaxis  will be ordered.      Darryl D. Prime, MD  Electronically Signed     DDP/MEDQ  D:  05/06/2009  T:  05/07/2009  Job:  [id number]

## 2011-05-15 ENCOUNTER — Telehealth: Payer: Self-pay

## 2011-05-15 NOTE — Telephone Encounter (Signed)
Pt called stating she went to pick up her medicaitons from the pharmcy and between the doctors she sees her medication cost her $440 dollars.  Pt wanted to make Dr. Scotty Court aware that [employer] will be calling the office to inform him about two medications:  Donepezil hcl 10mg -generic aricept and amthetamine salts 15mg .  Pt stated that the insurance company that the mediations she is currently on tier 2 or 3 drugs and these new medications that have recommended are tier 1 so the pt will only have to pay her copay.  Pt stated that she cannot afford to pay this much for her medication

## 2011-05-17 NOTE — Discharge Summary (Signed)
NAME:  Angelica Bell, Angelica Bell NO.:  [id number]   MEDICAL RECORD NO.:  [id number]          PATIENT TYPE:  OBV   LOCATION:  1613                         FACILITY[facility]   PHYSICIAN:  Raenette Rover. Felicity Coyer, MDDATE OF BIRTH:  July 08, 1932   DATE OF ADMISSION:  12/07/2006  DATE OF DISCHARGE:  12/08/2006                               DISCHARGE SUMMARY   DISCHARGE DIAGNOSES:  1. Mild dehydration and weakness, resolved.  2. Prolonged nausea, vomiting and diarrhea, resolved likely secondary      to infectious gastroenteritis.  3. Chronic narcotic use secondary to severe arthritis pain (question      history of rheumatoid arthritis though not on steroids or other      immune modifying drugs). Continue Duragesic and OxyContin.  4. Mild elevation in blood pressure without history of hypertension.      The patient to follow up. Discharge blood pressure 131/56.   DISCHARGE MEDICATIONS:  As prior to admission without change:  1. Aspirin 81 mg daily.  2. Fentanyl 100 mcg patch change q.72h.  3. OxyContin 30 mg p.o. q.6h. p.r.n. (to be offered 2-3 tablets per 24      hours).   DISCHARGE CONDITION:  Medically improved and stable, tolerating p.o.  with nausea, vomiting symptoms resolved. Hemodynamically stable.   HOSPITAL FOLLOWUP:  1. With primary care physician Dr. Rickard Patience.  2. As well as orthopedics, Dr. Ethelene Hal, on an as needed basis.   HOSPITAL COURSE:  Dehydration secondary to infectious gastroenteritis.  The patient is a 77 year old woman, overall healthy who takes large  doses of narcotics for control of her arthritis pain. She did experience  3 days ongoing with nausea, vomiting and diarrhea. These symptoms have  caused her to be unable to take her usual narcotic medications except  for her patch which caused her to feel worse all over. She was starting  to feel weak and dehydrated so she came to the emergency room for  further evaluation. Her vomiting and pain symptoms  were brought under  control with IV medication. Because of fatigue and weakness related to  her mild dehydration she was admitted for IV fluid observation and  symptomatic treatment. The following morning she was improved without  nausea, vomiting, tolerating clears, had no recurrence of diarrhea  __________  persistent. She felt improved and stable for discharge home.  She was hemodynamically stable after hypertension initially noted in the  emergency room at 147/70. She has no history of this diagnosis and blood  pressure has improved over the following days. I suspect this was an  acute pain component with having her vomiting and nausea. This has  improved over this hospitalization. Outpatient followup per primary MD  at next routine office visit.     [name], MD  Electronically Signed    VAL/MEDQ  D:  12/08/2006  T:  12/08/2006  Job:  [id number]

## 2011-05-17 NOTE — Discharge Summary (Signed)
NAME:  Angelica Bell, Angelica Bell NO.:  [id number]   MEDICAL RECORD NO.:  [id number]          PATIENT TYPE:  INP   LOCATION:  1512                         FACILITY:  [facility]   PHYSICIAN:  Gordy Savers, M.D. LHCDATE OF BIRTH:  07/04/1932   DATE OF ADMISSION:  01/08/2006  DATE OF DISCHARGE:  01/12/2006                                 DISCHARGE SUMMARY   FINAL DIAGNOSES:  Viral gastroenteritis.   ADDITIONAL DIAGNOSES:  Intractable nausea and vomiting, dehydration, chronic  pain syndrome, rheumatoid arthritis.   HISTORY OF PRESENT ILLNESS:  The patient is a 77 year old white female who  presented with severe nausea and vomiting and diarrhea.  She had developed  progressive weakness and inability to tolerate oral intake.  She was  initially evaluated in the ED.  Her white count was noted to be 20,000.  She  was subsequently admitted for further evaluation and treatment of acute  viral gastroenteritis.   PAST MEDICAL HISTORY:  Remarkable for history of rheumatoid arthritis and  chronic pain managed by Dr. Ethelene Hal.   LABORATORY DATA AND HOSPITAL COURSE:  The patient was admitted to the  hospital where she was supported with IV fluids.  Abdominal ultrasound was  obtained that revealed no evidence of gallbladder disease and fatty  infiltration of the liver.  The patient said that she did have a prior  history of suspected cholelithiasis. Acute abdominal series with chest x-ray  revealed some calcification of the right upper quadrant consistent with  gallstones, otherwise no acute changes.  The patient was treated  symptomatically with anti-emetics and analgesics and continued to improve.  Laboratory studies were unremarkable.  White count normalized at the time of  discharge white count was 5.7.  Chemistries were unremarkable.  During  hospital course, she was maintained on her Fentanyl patch and also received  IV Dilaudid and Phenergan as needed.   DISPOSITION:  The patient  was discharged today to return to the care of Dr.  Scotty Court and Dr. Ethelene Hal.   DISCHARGE MEDICATIONS:  Will be her home medications, aspirin 81 mg daily,  Fentanyl patch 75 mg every 3 days, and Percocet 10/325 one every 4-6h p.r.n.  pain.   CONDITION ON DISCHARGE:  Stable.           ______________________________  Gordy Savers, M.D. [facility]     PFK/MEDQ  D:  01/12/2006  T:  01/12/2006  Job:  [id number]

## 2011-05-17 NOTE — Discharge Summary (Signed)
NAME:  Angelica Bell, Angelica Bell NO.:  [id number]   MEDICAL RECORD NO.:  [id number]          PATIENT TYPE:  INP   LOCATION:  1506                         FACILITY:  [facility]   PHYSICIAN:  Rosalyn Gess. Norins, MD  DATE OF BIRTH:  1932-09-13   DATE OF ADMISSION:  02/22/2007  DATE OF DISCHARGE:  02/27/2007                               DISCHARGE SUMMARY   ADMISSION DIAGNOSES:  1. Pneumonia.  2. Intractable hip pain.  3. Urinary tract infection.   DISCHARGE DIAGNOSES:  1. Pneumonia.  2. Intractable hip pain.  3. Urinary tract infection.  4. Diarrhea.   CONSULTATIONS:  None.   PROCEDURES:  1. Chest x-ray February 22, 2007, revealed opacity in the mid and      right upper lung field consistent with pneumonia.  2. Follow-up chest x-ray February 23, 2007, with no change, with      suspected infectious process right upper lung.  3. Bilateral hip films and pelvis February 26, 2007, with no acute      bony findings or significant degenerative change.   HISTORY OF PRESENT ILLNESS:  Patient is a 77 year old woman with a  history of chronic narcotic use for osteoarthritis pain, presents to ER  with increasing hip pain.  Her evaluation revealed her to have lobar  pneumonia with a white count of 16,700 and a croupy cough.  For this  reason, she was admitted to hospital.   PAST MEDICAL HISTORY:  1. Osteoarthritis.  2. Chronic pain with narcotic management.  3. IBS.  4. Osteoporosis.  5. Scoliosis.   MEDICATIONS ON ADMISSION:  1. Duragesic patch q.48h.  2. OxyContin 30 mg q.6h.  3. Restoril 60 mg nightly.  4. Aspirin 81 mg daily.   PHYSICAL EXAMINATION ON ADMISSION:  Per Dr. Lovell Sheehan.  VITAL SIGNS:  Temperature 98.7, blood pressure 146/60.  LUNGS:  Decreased breath sounds at the right base.   ADMISSION LABORATORY DATA:  CBC with white count of 16,700, hemoglobin  12.8 g, hematocrit 37.7% with 87% segs, 5% lymphs, 7% monos.  Admitting  chemistries were remarkable for a  glucose of 149.   HOSPITAL COURSE:  PROBLEM #1 -  PNEUMONIA:  Patient was started on  Rocephin 1 g IV q.24h. and azithromycin 500 mg IV daily for community  acquired pneumonia.  On this regimen, the patient remained afebrile.  Her white count returned to normal.  Her cough improved.  Her shortness  of breath improved.  Her O2 saturation remained stable.  Patient was  able to be taken off azithromycin after three days of therapy.  After  four days of Rocephin, she was switched to Ceftin p.o.  She continued to  do well, remaining afebrile with O2 saturation at 94% on room air.  With  patient on oral antibiotics, with her pneumonia markedly improved, she  is thought to be stable and ready for discharge home.   PROBLEM #2 -  OSTEOARTHRITIS:  Patient with chronic osteoarthritic type  pain.  Hip films were obtained which showed no significant or acute  degenerative change.  Her examination had revealed significant  discomfort and  tenderness with external rotation of the hip and AP  pressure.  Patient was continued on her home pain medications and did  well.   PROBLEM #3 -  DIARRHEA:  Patient developed copious diarrhea on the third  hospital day.  Stool for C. difficile was negative, however, the patient  was empirically started on Flagyl 500 mg q.i.d.  She continued to have  diarrhea.  Her Flagyl was continued.  The second specimen for C.  difficile was sent. Patient was started on Lomotil and had improvement  with significant reduction in bowel movements.  With the patient being  now cleared of pneumonia, with her diarrhea controlled, with her  osteoarthritis being evaluated, she is thought to be stable and ready  for discharge home.   PHYSICAL EXAMINATION AT DISCHARGE:  GENERAL APPEARANCE:  A pleasant  woman in no acute distress.  VITAL SIGNS:  Temperature 98.7, blood pressure 145/76, pulse 69,  respirations 18, O2 saturation 94% on room air.  HEENT:  Unremarkable.  CHEST:  Patient is  moving air well.  She had no wheezing, rhonchi or  rales.  CARDIOVASCULAR:  There is 2+ radial pulse.  She had a regular rate and  rhythm without murmurs, rubs, or gallops.  ABDOMEN:  Soft.   DISCHARGE LABORATORY DATA:  CBC February 26, 2007, with white count  3700, hemoglobin 12.2 g.  Differential was normal.  Final chemistries on  February 26, 2007, sodium 142, potassium 3.8, chloride 106, CO2 29,  glucose 100, BUN 2, creatinine 0.46.  C. difficile from February 25, 2007, is pending.   DISCHARGE MEDICATIONS:  1. Patient will resume all of her home medications.  2. She will continue on narcotic cough syrup with Phenergan with      Codeine.  3. She will continue Ceftin 250 mg b.i.d. for an additional four days.  4. She will continue Flagyl 500 mg q.i.d. for an additional five days.   DISPOSITION:  Patient is to return to Dr. Cecille Rubin in 7-10 days for  follow-up.   CONDITION ON DISCHARGE:  Stable and improved.      Rosalyn Gess Norins, MD  Electronically Signed     MEN/MEDQ  D:  02/27/2007  T:  02/27/2007  Job:  [id number]   cc:   Ellin Saba., MD  [address] [place]  [place] [address]

## 2011-05-17 NOTE — H&P (Signed)
NAME:  Angelica Bell, Angelica Bell NO.:  [id number]   MEDICAL RECORD NO.:  [id number]          PATIENT TYPE:  INP   LOCATION:  0109                         FACILITY:  [facility]   PHYSICIAN:  Georgina Quint. Plotnikov, M.D. Romualdo Bolk OF BIRTH:  1932-05-22   DATE OF ADMISSION:  01/08/2006  DATE OF DISCHARGE:                                HISTORY & PHYSICAL   CHIEF COMPLAINT:  Weakness, diarrhea, nausea, vomiting, abdominal pain.   HISTORY OF PRESENT ILLNESS:  The patient is a 77 year old female who  presents with severe nausea, vomiting, diarrhea multiple times starting this  morning, yesterday felt weak but did not start with vomiting and diarrhea  yet, has been feeling progressively worse, presented to the ER where she was  found to have a white count of 20,000.   PAST MEDICAL HISTORY:  Rheumatoid arthritis with chronic pain, managed by  Dr. Modesta Messing.   CURRENT MEDICATIONS:  1.  Fentanyl patch 75 mcg.  2.  Percocet 10/325, on average five daily.  3.  Aspirin 81 mg daily.   SOCIAL HISTORY:  She is married.  She is a retired Administrator record person.  Does not smoke or drink alcohol.   ALLERGIES:  SULFA.   FAMILY HISTORY:  Negative for coronary disease or cancer.   REVIEW OF SYSTEMS:  Negative for chest pain, no syncope, and no recent  antibiotics.  The rest is negative or as above.   PHYSICAL EXAMINATION:  VITAL SIGNS:  Temp 97.0, blood pressure 135/83, heart  rate 113, respirations 20, sat is 100% on room air.  GENERAL:  She is in mild acute distress.  HEENT:  With dry oral mucosa.  NECK:  Supple.  No thyromegaly or bruit.  LUNGS:  Clear.  No wheezes or rales.  HEART:  S1 S2.  No murmur.  No gallop.  ABDOMEN:  Slightly distended.  Sensitive throughout.  No organomegaly felt.  RECTAL:  Per Dr. Ignacia Palma.  LOWER EXTREMITIES:  Without edema.  Calves nontender.  NEUROLOGIC:  She is alert, oriented, cooperative.  MUSCULOSKELETAL:  Hips tender to palpation as well as L, S spine.  SKIN:  Clear.   LABORATORY:  A urinalysis is negative.  Abdominal ultrasound negative for  gallstones.  Abdominal x-ray without acute changes.  White cell count 20.9  thousand, hemoglobin 15.5.   ASSESSMENT/PLAN:  1.  Acute gastroenteritis.  I will treat symptomatically.  Stool cultures if      no improvement.  2.  Nausea, vomiting, diarrhea.  Intravenous fluids, Phenergan, Lomotil.  3.  Dehydration.  Plan as above with intravenous fluids.  4.  Chronic pain.  She will be given morphine intravenously.  We will      restart Percocet orally as tolerated and the Fentanyl patch.  5.  Rheumatoid arthritis.  She does not recall when she had steroid      treatment last.  We will give intravenous hydrocortisone for stress      coverage.  6.  Elevated white cell count.  We will recheck tomorrow.           ______________________________  Georgina Quint Plotnikov,  M.D. [facility]     AVP/MEDQ  D:  01/08/2006  T:  01/08/2006  Job:  [id number]   cc:   Caralyn Guile. Ethelene Hal, M.D.  Fax: [phone]

## 2011-05-29 ENCOUNTER — Encounter: Payer: Self-pay | Admitting: Family Medicine

## 2011-05-29 ENCOUNTER — Ambulatory Visit (INDEPENDENT_AMBULATORY_CARE_PROVIDER_SITE_OTHER): Payer: Medicare Other | Admitting: Family Medicine

## 2011-05-29 VITALS — BP 130/84 | HR 99 | Temp 97.7°F | Wt 141.0 lb

## 2011-05-29 DIAGNOSIS — M549 Dorsalgia, unspecified: Secondary | ICD-10-CM

## 2011-05-29 DIAGNOSIS — M62838 Other muscle spasm: Secondary | ICD-10-CM

## 2011-05-29 DIAGNOSIS — IMO0001 Reserved for inherently not codable concepts without codable children: Secondary | ICD-10-CM

## 2011-05-29 DIAGNOSIS — R350 Frequency of micturition: Secondary | ICD-10-CM

## 2011-05-29 DIAGNOSIS — K589 Irritable bowel syndrome without diarrhea: Secondary | ICD-10-CM

## 2011-05-29 DIAGNOSIS — G8929 Other chronic pain: Secondary | ICD-10-CM

## 2011-05-29 DIAGNOSIS — R109 Unspecified abdominal pain: Secondary | ICD-10-CM

## 2011-05-29 DIAGNOSIS — G47419 Narcolepsy without cataplexy: Secondary | ICD-10-CM

## 2011-05-29 LAB — POCT URINALYSIS DIPSTICK
Leukocytes, UA: NEGATIVE
Nitrite, UA: NEGATIVE
Protein, UA: NEGATIVE
Urobilinogen, UA: 0.2
pH, UA: 5.5

## 2011-05-29 NOTE — Patient Instructions (Signed)
You have muscle spasm  Over left side of back, start flexeril 10 mg, 1/2 -1 3 times daily for MUSCLE SPASM  CONTINUE  Other medications as prescribed Urinalysis is negattive, no urinary infection, pain over kidney is from muscle spasm

## 2011-05-30 ENCOUNTER — Telehealth: Payer: Self-pay

## 2011-05-30 NOTE — Telephone Encounter (Signed)
Pt left a form from [facility] and request the form be sent back in asap.  Pt is aware form has been faxed back to [facility].

## 2011-06-09 NOTE — Progress Notes (Signed)
  Subjective:    Patient ID: Angelica Bell, female    DOB: Apr 30, 1932, 77 y.o.   MRN: [id number] This 76 year old white fwidow he didn't complain of left-sided flank pain her lites painful to touch states has some urinary frequency however urinalysis is negative she has chronic history of low back pain is under her. In treatment of Dr. Ilean China, orthopedic pain clinic, he prescribed oxycodone 30 mg T12-1 patient of problem with insomnia and takes temazepam 30 mg h.s. also Dr. Ardyth Man was prescribed fentanyl she has mild or early Alzheimer's as well as being treated for narcolepsy. History of irritable bowel syndrome and needs Lomotil hold fairly regular basisHPI    Review of Systemssee history of present illness    Objective:   Physical Exam the patient is a girl of developed white female who when walks as a night kyphotic uric she is alert and cooperative His marked muscle spasm over the right lumbar spine extending to the right CVA region Heart and lung examination revealed no acute problem        Assessment & Plan:  Acute muscle spasm to be treated with Flexeril, cold and heat applications continue to use analgesics as prescribed by Dr. Ethelene Hal Narcolepsy controlled Adderall Continue Aricept for early Alzheimer's IBS use Lomotil as needed

## 2011-06-10 ENCOUNTER — Telehealth: Payer: Self-pay

## 2011-06-10 NOTE — Telephone Encounter (Signed)
Per Ulice Bold pt's appeal for donepezil and amphetamine salts have been overturned and approved for 1 year.  Per Ulice Bold pt and Dr. Scotty Court will received a written response.

## 2011-07-02 ENCOUNTER — Ambulatory Visit (INDEPENDENT_AMBULATORY_CARE_PROVIDER_SITE_OTHER): Payer: Medicare Other | Admitting: Family Medicine

## 2011-07-02 DIAGNOSIS — F341 Dysthymic disorder: Secondary | ICD-10-CM

## 2011-07-02 DIAGNOSIS — G629 Polyneuropathy, unspecified: Secondary | ICD-10-CM

## 2011-07-02 DIAGNOSIS — F329 Major depressive disorder, single episode, unspecified: Secondary | ICD-10-CM

## 2011-07-02 DIAGNOSIS — M1990 Unspecified osteoarthritis, unspecified site: Secondary | ICD-10-CM

## 2011-07-02 DIAGNOSIS — G609 Hereditary and idiopathic neuropathy, unspecified: Secondary | ICD-10-CM

## 2011-07-02 DIAGNOSIS — G47419 Narcolepsy without cataplexy: Secondary | ICD-10-CM

## 2011-07-02 MED ORDER — AMPHETAMINE-DEXTROAMPHETAMINE 30 MG PO TABS: ORAL_TABLET | ORAL | Status: DC

## 2011-07-02 MED ORDER — DIPHENOXYLATE-ATROPINE 2.5-0.025 MG PO TABS: 2.0000 | ORAL_TABLET | Freq: Four times a day (QID) | ORAL | Status: DC | PRN

## 2011-07-29 ENCOUNTER — Other Ambulatory Visit: Payer: Self-pay

## 2011-07-29 MED ORDER — TEMAZEPAM 30 MG PO CAPS: 30.0000 mg | ORAL_CAPSULE | Freq: Every evening | ORAL | Status: DC | PRN

## 2011-07-29 NOTE — Telephone Encounter (Signed)
rx called into pharmacy

## 2011-08-01 ENCOUNTER — Encounter: Payer: Self-pay | Admitting: Family Medicine

## 2011-08-01 ENCOUNTER — Ambulatory Visit (INDEPENDENT_AMBULATORY_CARE_PROVIDER_SITE_OTHER): Payer: Medicare Other | Admitting: Family Medicine

## 2011-08-01 VITALS — BP 128/88 | Temp 98.4°F | Wt 138.0 lb

## 2011-08-01 DIAGNOSIS — L03011 Cellulitis of right finger: Secondary | ICD-10-CM

## 2011-08-01 DIAGNOSIS — L219 Seborrheic dermatitis, unspecified: Secondary | ICD-10-CM

## 2011-08-01 DIAGNOSIS — L02519 Cutaneous abscess of unspecified hand: Secondary | ICD-10-CM

## 2011-08-01 DIAGNOSIS — H109 Unspecified conjunctivitis: Secondary | ICD-10-CM

## 2011-08-01 MED ORDER — KETOCONAZOLE 2 % EX SHAM: MEDICATED_SHAMPOO | CUTANEOUS | Status: DC

## 2011-08-01 MED ORDER — CEPHALEXIN 500 MG PO CAPS: 500.0000 mg | ORAL_CAPSULE | Freq: Four times a day (QID) | ORAL | Status: AC

## 2011-08-01 MED ORDER — TOBRAMYCIN-DEXAMETHASONE 0.3-0.1 % OP SUSP: OPHTHALMIC | Status: DC

## 2011-08-01 NOTE — Progress Notes (Signed)
  Subjective:    Patient ID: Angelica Bell, female    DOB: [birth year], 77 y.o.   MRN: [id number] This 77 year old white female is in today complaining known infected right ring finger as well as an infection her neck as well as conjunctivitis bilaterally . Complaining of seborrhea of eye brows. No other complaints HPI    Review of Systems CHPI     Objective:   Physical Exam the patient is a well-built well-nourished alert pleasant female Eyes reveal bilateral conjunctivitis with yellow drainage as well as infection of eyelids Conjunctiva erythematous bilaterally exudate Examination of the neck reveals an area of saline from the right lateral area with no drainage  Lamination of the right ring finger reveals area of saline this on the distal phalanx erythematous nonvesicular no exudate       Assessment & Plan:  Cellulitis this of skin of the neck as well as of the right ring finger to treat with cephalexin 500 mg 4 times a day for 10 days, apply Triple Antibiotic ointment twice a day also hot soaks for 10 minutes 3 times a day Conjunctivitis bilaterally 2 treat with TobraDex 3 times a day Seborrhea dermatitis Nizoral shampoo 2 eyebrows twice a day for 10 days and then 2 times per week as needed

## 2011-08-01 NOTE — Patient Instructions (Signed)
You have infection of the right ring finger as well as and [place] Saline his right lateral neck also infection of the eyes conjunctivitis bilaterally as well as slight infection eye lids Seborrhea to be treated with Nizoral shampoo Cellulitis finger treat with cephalexin 500 mg 4 times a day as well as hot soaks for 10 minutes 3 times a day

## 2011-08-05 NOTE — Progress Notes (Signed)
  Subjective:    Patient ID: Angelica Bell, female    DOB: Nov 29, 1932, 77 y.o.   MRN: [id number] This 77 year old white widow is in for refill medications and also to help her get a reduced price through some organization. She has no other new symptoms on system review     HPI    Review of Systems CHPI     Objective:   Physical Exam patient will well-nourished white female who appears to be in no distress but has kyphosis noted when she walks Heart lungs no abnormalities noted at this time Extremities no abnormalities noted no edema        Assessment & Plan:  No changes in physical findings to refill medications for memory loss narcolepsy anxiety depression osteoarthritis chronic back pain refill medications except for pain medications which were panel the pain clinic

## 2011-08-05 NOTE — Patient Instructions (Signed)
Refilled your medications and the form reveal out in order to help you get reduced prices on you're Adderall

## 2011-08-08 ENCOUNTER — Encounter: Payer: Self-pay | Admitting: Family Medicine

## 2011-08-08 ENCOUNTER — Ambulatory Visit (INDEPENDENT_AMBULATORY_CARE_PROVIDER_SITE_OTHER): Payer: Medicare Other | Admitting: Family Medicine

## 2011-08-08 VITALS — BP 122/90 | Temp 97.9°F | Wt 136.5 lb

## 2011-08-08 DIAGNOSIS — F329 Major depressive disorder, single episode, unspecified: Secondary | ICD-10-CM

## 2011-08-08 DIAGNOSIS — G47419 Narcolepsy without cataplexy: Secondary | ICD-10-CM

## 2011-08-08 DIAGNOSIS — F341 Dysthymic disorder: Secondary | ICD-10-CM

## 2011-08-08 DIAGNOSIS — L0390 Cellulitis, unspecified: Secondary | ICD-10-CM

## 2011-08-08 DIAGNOSIS — L0291 Cutaneous abscess, unspecified: Secondary | ICD-10-CM

## 2011-08-08 DIAGNOSIS — H109 Unspecified conjunctivitis: Secondary | ICD-10-CM

## 2011-08-08 MED ORDER — AMPHETAMINE-DEXTROAMPHETAMINE 30 MG PO TABS: ORAL_TABLET | ORAL | Status: DC

## 2011-08-08 MED ORDER — CEFTRIAXONE SODIUM 1 G IJ SOLR
1.0000 g | Freq: Once | INTRAMUSCULAR | Status: AC
  Administered 2011-08-08: 1 g via INTRAMUSCULAR

## 2011-08-08 MED ORDER — ALPRAZOLAM 0.25 MG PO TABS: 0.2500 mg | ORAL_TABLET | Freq: Three times a day (TID) | ORAL | Status: DC | PRN

## 2011-08-08 MED ORDER — ERYTHROMYCIN 5 MG/GM OP OINT: TOPICAL_OINTMENT | OPHTHALMIC | Status: DC

## 2011-08-08 NOTE — Patient Instructions (Signed)
Eye infection erytromycin ointmen  In both eye twice per day For finger Continue cephalexin, triple antibiotic ointment to finger and neck Rocephin 1.0 gm IM for infection prescriptions for adderral

## 2011-08-08 NOTE — Progress Notes (Signed)
  Subjective:    Patient ID: Angelica Bell, female    DOB: [birth year], 77 y.o.   MRN: [id number] This 77 year old white female is in for a followup emanation of infected finger and infected eyes and for refill on some of her medications. He relates her infections have improved but have not improved fast enough. HPI    Review of Systems patient continues to have infected finger, conjunctivitis and cellulitis on neck no other systems are symptomatic    Objective:   Physical Exam patient is a well-built well-nourished white female in no distress HEENT reveals conjunctival erythema no drainage and also at evident infection of the eyelids for which is less than previous examination Left index finger continues to be erythematous and 2 small lesions but or is not warm nor tender to touch as on previous examination        Assessment & Plan:  Bilateral conjunctivitis who had erythromycin ointmentt 3 times a day Continue TobraDex twice a day Cellulitis of the finger continue triple antibiotic finish the cephalexin and given 1 g Rocephin today

## 2011-08-25 ENCOUNTER — Other Ambulatory Visit: Payer: Self-pay | Admitting: Family Medicine

## 2011-09-19 ENCOUNTER — Telehealth: Payer: Self-pay

## 2011-09-19 NOTE — Telephone Encounter (Signed)
Per Dr. Scotty Court refer pt to Dr. Terri Piedra

## 2011-09-19 NOTE — Telephone Encounter (Signed)
Pt called and states she is having a breakout of blisters again.  Pt states she went to the eye doctor and was put on steroids. Pt states she would like to be referred to a dermatologist.

## 2011-09-19 NOTE — Telephone Encounter (Signed)
Called and left amessage on pt's voicemail about picking up paperwork from office and a referral to Dr. Terri Piedra.

## 2011-10-01 ENCOUNTER — Telehealth: Payer: Self-pay

## 2011-10-01 ENCOUNTER — Ambulatory Visit (INDEPENDENT_AMBULATORY_CARE_PROVIDER_SITE_OTHER): Payer: Medicare Other

## 2011-10-01 DIAGNOSIS — Z23 Encounter for immunization: Secondary | ICD-10-CM

## 2011-10-01 MED ORDER — AMPHETAMINE-DEXTROAMPHETAMINE 30 MG PO TABS: ORAL_TABLET | ORAL | Status: DC

## 2011-10-01 NOTE — Telephone Encounter (Signed)
Per Dr. Scotty Court reprint Adderall 20 for October.

## 2011-11-25 ENCOUNTER — Telehealth: Payer: Self-pay | Admitting: Internal Medicine

## 2011-11-25 MED ORDER — AMPHETAMINE-DEXTROAMPHETAMINE 30 MG PO TABS: 30.0000 mg | ORAL_TABLET | Freq: Two times a day (BID) | ORAL | Status: DC

## 2011-11-25 NOTE — Telephone Encounter (Signed)
Pt need new rx generic adderall 30 mg. Pt has 4 pills left

## 2011-11-25 NOTE — Telephone Encounter (Signed)
ok 

## 2011-12-10 ENCOUNTER — Telehealth: Payer: Self-pay

## 2011-12-10 ENCOUNTER — Ambulatory Visit: Payer: Medicare Other | Admitting: Internal Medicine

## 2011-12-10 NOTE — Telephone Encounter (Signed)
Attempt to call -VM - LMTCB to r/s new to est with dr. Amador Cunas /dr/[place] pt

## 2011-12-12 ENCOUNTER — Encounter: Payer: Self-pay | Admitting: Internal Medicine

## 2011-12-12 ENCOUNTER — Ambulatory Visit (INDEPENDENT_AMBULATORY_CARE_PROVIDER_SITE_OTHER): Payer: Medicare Other | Admitting: Internal Medicine

## 2011-12-12 DIAGNOSIS — F341 Dysthymic disorder: Secondary | ICD-10-CM

## 2011-12-12 DIAGNOSIS — M545 Low back pain, unspecified: Secondary | ICD-10-CM

## 2011-12-12 DIAGNOSIS — M4180 Other forms of scoliosis, site unspecified: Secondary | ICD-10-CM

## 2011-12-12 DIAGNOSIS — M1990 Unspecified osteoarthritis, unspecified site: Secondary | ICD-10-CM

## 2011-12-12 NOTE — Progress Notes (Signed)
  Subjective:    Patient ID: Angelica Bell, female    DOB: 13-Oct-1932, 77 y.o.   MRN: [id number]  HPI  77 year old patient who is seen today to establish with my practice. She is a former patient of Dr. Scotty Court. She is followed by pain management due to severe low back pain due to advanced degenerative changes. She has a history of anxiety depression osteoporosis memory loss. Complaints today include some mild cold symptoms as well as lower extremity edema. She states that she has had leg swelling for the past 3 or 4 months.    Review of Systems  Constitutional: Positive for fatigue.  HENT: Negative for hearing loss, congestion, sore throat, rhinorrhea, dental problem, sinus pressure and tinnitus.   Eyes: Negative for pain, discharge and visual disturbance.  Respiratory: Negative for cough and shortness of breath.   Cardiovascular: Positive for leg swelling. Negative for chest pain and palpitations.  Gastrointestinal: Negative for nausea, vomiting, abdominal pain, diarrhea, constipation, blood in stool and abdominal distention.  Genitourinary: Negative for dysuria, urgency, frequency, hematuria, flank pain, vaginal bleeding, vaginal discharge, difficulty urinating, vaginal pain and pelvic pain.  Musculoskeletal: Positive for back pain. Negative for joint swelling, arthralgias and gait problem.  Skin: Negative for rash.  Neurological: Positive for weakness. Negative for dizziness, syncope, speech difficulty, numbness and headaches.  Hematological: Negative for adenopathy.  Psychiatric/Behavioral: Positive for dysphoric mood. Negative for behavioral problems and agitation. The patient is nervous/anxious.        Objective:   Physical Exam  Constitutional: She is oriented to person, place, and time. She appears well-developed and well-nourished.  HENT:  Head: Normocephalic.  Right Ear: External ear normal.  Left Ear: External ear normal.  Mouth/Throat: Oropharynx is clear and moist.    Eyes: Conjunctivae and EOM are normal. Pupils are equal, round, and reactive to light.  Neck: Normal range of motion. Neck supple. No thyromegaly present.  Cardiovascular: Normal rate, regular rhythm and normal heart sounds.        Diminished left posterior tibial pulse  Pulmonary/Chest: Effort normal and breath sounds normal.  Abdominal: Soft. Bowel sounds are normal. She exhibits no mass. There is no tenderness.  Musculoskeletal: Normal range of motion. She exhibits no edema.       No clinical edema of the lower extremities  Lymphadenopathy:    She has no cervical adenopathy.  Neurological: She is alert and oriented to person, place, and time.  Skin: Skin is warm and dry. No rash noted.  Psychiatric: She has a normal mood and affect. Her behavior is normal.          Assessment & Plan:   Chronic low back pain secondary to advanced degenerative changes Anxiety depression Osteoporosis  We'll continue present regimen. She states that she does not require medication refill at this time. We'll followup with pain management and see here in 3 months or as needed

## 2011-12-12 NOTE — Patient Instructions (Signed)
Limit your sodium (Salt) intake  Return in 3 months for follow-up   

## 2011-12-23 ENCOUNTER — Telehealth: Payer: Self-pay | Admitting: Internal Medicine

## 2011-12-23 NOTE — Telephone Encounter (Signed)
Gave pharm on for early refill on friday

## 2011-12-23 NOTE — Telephone Encounter (Signed)
Pt requesting early refill of generic Adderall, which is due tomorrow. Needs today.

## 2012-02-07 ENCOUNTER — Telehealth: Payer: Self-pay | Admitting: Internal Medicine

## 2012-02-07 NOTE — Telephone Encounter (Signed)
Pt called and said that Dr Scotty Court had prescribed and abx, a couple of yrs ago, for a lump that pt had on her genital area. Pt said that the lump was very painful. Pt said that she has another painful bump that has come up again in the same place and is req a refill of the abx to be called in to [facility]. [phone]. Pt would like a call back from nurse asap.

## 2012-02-07 NOTE — Telephone Encounter (Signed)
Spoke with patient and an appointment made 

## 2012-02-08 ENCOUNTER — Ambulatory Visit (INDEPENDENT_AMBULATORY_CARE_PROVIDER_SITE_OTHER): Payer: Medicare Other | Admitting: Family Medicine

## 2012-02-08 ENCOUNTER — Encounter: Payer: Self-pay | Admitting: Family Medicine

## 2012-02-08 VITALS — BP 148/82 | HR 81 | Temp 97.0°F | Wt 134.0 lb

## 2012-02-08 DIAGNOSIS — N907 Vulvar cyst: Secondary | ICD-10-CM | POA: Insufficient documentation

## 2012-02-08 DIAGNOSIS — N9089 Other specified noninflammatory disorders of vulva and perineum: Secondary | ICD-10-CM

## 2012-02-08 MED ORDER — AMOXICILLIN-POT CLAVULANATE 875-125 MG PO TABS: 1.0000 | ORAL_TABLET | Freq: Two times a day (BID) | ORAL | Status: DC

## 2012-02-08 NOTE — Progress Notes (Signed)
Subjective:    Patient ID: Angelica Bell, female    DOB: 10-30-1932, 78 y.o.   MRN: [id number]  HPI Has a painful lump in upper leg/ groin area  Started bothering her yesterday afternoon - is painful  Sat in the bathtub last night -- had a white head and also it drained   Red and inflammed  No fever  Feels fine  Is taking oxycodone for her back and pain patch   Patient Active Problem List  Diagnoses  . HYPERLIPIDEMIA  . ANXIETY DEPRESSION  . NARCOLEPSY WITHOUT CATAPLEXY  . PERIPHERAL NEUROPATHY, LOWER EXTREMITY, RIGHT  . POSTMENOPAUSAL SYNDROME  . OSTEOARTHRITIS  . Lumbago  . OSTEOPOROSIS  . SCOLIOSIS NEC  . INSOMNIA-SLEEP DISORDER-UNSPEC  . MEMORY LOSS  . Labial cyst   Past Medical History  Diagnosis Date  . Osteoarthritis   . Osteoporosis   . Pneumonia   . Chronic pain disorder     with narcotic management  . Hyperlipidemia   . Hypertension   . Chronic back pain     with degenerative joint disease   Past Surgical History  Procedure Date  . Appendectomy   . Abdominal hysterectomy    History  Substance Use Topics  . Smoking status: Former Games developer  . Smokeless tobacco: Never Used  . Alcohol Use: No   No family history on file. Allergies  Allergen Reactions  . Sulfonamide Derivatives    Current Outpatient Prescriptions on File Prior to Visit  Medication Sig Dispense Refill  . ALPRAZolam (XANAX) 0.25 MG tablet Take 1 tablet (0.25 mg total) by mouth 3 (three) times daily as needed. For stress  90 tablet  5  . amphetamine-dextroamphetamine (ADDERALL, 30MG ,) 30 MG tablet Take 1 tablet (30 mg total) by mouth 2 (two) times daily.  60 tablet  0  . aspirin 81 MG tablet Take 81 mg by mouth daily.        . diphenoxylate-atropine (LOMOTIL) 2.5-0.025 MG per tablet Take 2 tablets by mouth 4 (four) times daily as needed. For diarrhea  100 tablet  5  . donepezil (ARICEPT) 10 MG tablet TAKE 1 TABLET BY MOUTH EVERY DAY  30 tablet  6  . erythromycin ophthalmic ointment  Apply to eyes and eyelids bid  3.5 g  1  . fentaNYL (DURAGESIC) 100 MCG/HR Place 1 patch onto the skin every 3 (three) days. Dr. Ethelene Hal        . Multiple Vitamin (MULTIVITAMIN) tablet Take 1 tablet by mouth daily.        . Omega-3 Fatty Acids (FISH OIL) 1200 MG CAPS Take by mouth 2 (two) times daily.        Marland Kitchen oxycodone (OXYCONTIN) 30 MG TB12 Take 30 mg by mouth every 12 (twelve) hours. Dr. Ethelene Hal       . prochlorperazine (COMPAZINE) 10 MG tablet Take 10 mg by mouth every 6 (six) hours as needed. As needed for nausea         . temazepam (RESTORIL) 30 MG capsule Take 1 capsule (30 mg total) by mouth at bedtime as needed. For sleep    30 capsule  5  . Vit B6-Vit B12-Omega 3 Acids (VITAMIN B PLUS+ PO) Take by mouth.        . tobramycin-dexamethasone (TOBRADEX) ophthalmic solution 1 drop in ecach eye tid, also apply to eye lids  5 mL  1     Review of Systems Review of Systems  Constitutional: Negative for fever, appetite change, fatigue and unexpected weight  change.  Eyes: Negative for pain and visual disturbance.  Respiratory: Negative for cough and shortness of breath.   Cardiovascular: Negative for cp or palpitations    Gastrointestinal: Negative for nausea, diarrhea and constipation.  Genitourinary: Negative for urgency and frequency. neg for pelvic and bladder pain  Skin: Negative for pallor or rash   Neurological: Negative for weakness, light-headedness, numbness and headaches.  Hematological: Negative for adenopathy. Does not bruise/bleed easily.  Psychiatric/Behavioral: Negative for dysphoric mood. The patient is not nervous/anxious.          Objective:   Physical Exam  Constitutional: She appears well-developed and well-nourished. No distress.  HENT:  Head: Normocephalic and atraumatic.  Neck: Normal range of motion. Neck supple. No JVD present. No thyromegaly present.  Cardiovascular: Normal rate, regular rhythm and normal heart sounds.   Pulmonary/Chest: Effort normal and  breath sounds normal.  Genitourinary: There is tenderness and lesion on the left labia. No bleeding around the vagina. No vaginal discharge found.       See skin exam for labial cyst  Lymphadenopathy:    She has no cervical adenopathy.  Skin: Skin is warm and dry. There is erythema.       Cyst in L lower labia minora - is draining pus Small amt of pus expressed - is tender to palp and erythematous  1 cm in diameter  No other abnormalities   Psychiatric: She has a normal mood and affect.          Assessment & Plan:

## 2012-02-08 NOTE — Patient Instructions (Signed)
Continue to use warm compresses on the cyst  I sent px for augmentin to your pharmacy- take it as directed  If worse pain/ fever or cyst gets bigger- please call  Follow up with your regular doctor next week if this does not improve

## 2012-02-08 NOTE — Assessment & Plan Note (Signed)
L lower labia minora 1 cm cyst that is draining --mild  Expressed small amt of pus  tx with augmentin bid and update  inst warm compresses- and Update if not starting to improve in a week or if worsening

## 2012-02-10 ENCOUNTER — Telehealth: Payer: Self-pay | Admitting: *Deleted

## 2012-02-10 ENCOUNTER — Ambulatory Visit: Payer: Medicare Other | Admitting: Internal Medicine

## 2012-02-10 MED ORDER — CEPHALEXIN 500 MG PO CAPS: 500.0000 mg | ORAL_CAPSULE | Freq: Four times a day (QID) | ORAL | Status: DC

## 2012-02-10 NOTE — Telephone Encounter (Addendum)
Pt. [facility] with cyst of left lower  labia minora, and given Augmentin which is giving her diarrhea and needs to know if Dr. Tawanna Cooler will change it.?

## 2012-02-10 NOTE — Telephone Encounter (Signed)
Kim, this should have gone to you in the first place. Pt is now on the phone and wanting ana answer. Thanks! Please call ASAP.

## 2012-02-10 NOTE — Telephone Encounter (Signed)
Please change antibiotic coverage to cephalexin 500 mg #20 to take twice a day

## 2012-02-10 NOTE — Telephone Encounter (Signed)
Pt is calling back again for an answer.

## 2012-02-10 NOTE — Telephone Encounter (Signed)
Spoke with pt - informed of abx change - [facility] , stop augmentin

## 2012-02-18 ENCOUNTER — Ambulatory Visit (INDEPENDENT_AMBULATORY_CARE_PROVIDER_SITE_OTHER): Payer: Medicare Other | Admitting: Internal Medicine

## 2012-02-18 ENCOUNTER — Encounter: Payer: Self-pay | Admitting: Internal Medicine

## 2012-02-18 DIAGNOSIS — F341 Dysthymic disorder: Secondary | ICD-10-CM

## 2012-02-18 DIAGNOSIS — M1990 Unspecified osteoarthritis, unspecified site: Secondary | ICD-10-CM

## 2012-02-18 DIAGNOSIS — G4700 Insomnia, unspecified: Secondary | ICD-10-CM

## 2012-02-18 DIAGNOSIS — E785 Hyperlipidemia, unspecified: Secondary | ICD-10-CM

## 2012-02-18 LAB — COMPREHENSIVE METABOLIC PANEL
ALT: 18 U/L (ref 0–35)
Albumin: 4 g/dL (ref 3.5–5.2)
CO2: 29 mEq/L (ref 19–32)
GFR: 78.23 mL/min (ref >60.00)
Potassium: 5.2 mEq/L — ABNORMAL HIGH (ref 3.5–5.1)
Sodium: 144 mEq/L (ref 135–145)
Total Bilirubin: 0.4 mg/dL (ref 0.3–1.2)
Total Protein: 6.7 g/dL (ref 6.0–8.3)

## 2012-02-18 LAB — CBC WITH DIFFERENTIAL/PLATELET
Basophils Absolute: 0.1 10*3/uL (ref 0.0–0.1)
HCT: 42.9 % (ref 36.0–46.0)
Lymphs Abs: 3.5 10*3/uL (ref 0.7–4.0)
MCV: 86.3 fl (ref 78.0–100.0)
Monocytes Absolute: 0.8 10*3/uL (ref 0.1–1.0)
Neutrophils Relative %: 52.1 % (ref 43.0–77.0)
Platelets: 292 10*3/uL (ref 150.0–400.0)
RDW: 13.9 % (ref 11.5–14.6)

## 2012-02-18 LAB — LIPID PANEL
HDL: 70 mg/dL (ref >39.00)
Total CHOL/HDL Ratio: 3

## 2012-02-18 LAB — TSH: TSH: 1.18 u[IU]/mL (ref 0.35–5.50)

## 2012-02-18 MED ORDER — AMPHETAMINE-DEXTROAMPHETAMINE 30 MG PO TABS: 30.0000 mg | ORAL_TABLET | Freq: Two times a day (BID) | ORAL | Status: DC

## 2012-02-18 MED ORDER — ALPRAZOLAM 0.25 MG PO TABS: 0.2500 mg | ORAL_TABLET | Freq: Three times a day (TID) | ORAL | Status: DC | PRN

## 2012-02-18 MED ORDER — DIPHENOXYLATE-ATROPINE 2.5-0.025 MG PO TABS: 2.0000 | ORAL_TABLET | Freq: Four times a day (QID) | ORAL | Status: DC | PRN

## 2012-02-18 NOTE — Patient Instructions (Signed)
Limit your sodium (Salt) intake    It is important that you exercise regularly, at least 20 minutes 3 to 4 times per week.  If you develop chest pain or shortness of breath seek  medical attention.  Take a calcium supplement, plus 800-1200 units of vitamin D  Return in 6 months for follow-up  

## 2012-02-18 NOTE — Progress Notes (Signed)
  Subjective:    Patient ID: Angelica Bell, female    DOB: 1932-08-11, 78 y.o.   MRN: [id number]  HPI  78 year old patient who is seen today for followup. She has a history of chronic pain followed by chronic pain management. She has a history of anxiety depression and also an apparent history of narcolepsy. She is on Adderall she has insomnia. She is seen today also for fasting lab which has not been done in some time. She has osteoporosis. She continues to complain of some pedal edema    Review of Systems  Constitutional: Negative.   HENT: Negative for hearing loss, congestion, sore throat, rhinorrhea, dental problem, sinus pressure and tinnitus.   Eyes: Negative for pain, discharge and visual disturbance.  Respiratory: Negative for cough and shortness of breath.   Cardiovascular: Positive for leg swelling. Negative for chest pain and palpitations.  Gastrointestinal: Negative for nausea, vomiting, abdominal pain, diarrhea, constipation, blood in stool and abdominal distention.  Genitourinary: Negative for dysuria, urgency, frequency, hematuria, flank pain, vaginal bleeding, vaginal discharge, difficulty urinating, vaginal pain and pelvic pain.  Musculoskeletal: Positive for back pain and arthralgias. Negative for joint swelling and gait problem.  Skin: Negative for rash.  Neurological: Negative for dizziness, syncope, speech difficulty, weakness, numbness and headaches.  Hematological: Negative for adenopathy.  Psychiatric/Behavioral: Positive for sleep disturbance. Negative for behavioral problems, dysphoric mood and agitation. The patient is nervous/anxious.        Objective:   Physical Exam  Constitutional: She is oriented to person, place, and time. She appears well-developed and well-nourished.  HENT:  Head: Normocephalic.  Right Ear: External ear normal.  Left Ear: External ear normal.  Mouth/Throat: Oropharynx is clear and moist.  Eyes: Conjunctivae and EOM are normal. Pupils  are equal, round, and reactive to light.  Neck: Normal range of motion. Neck supple. No thyromegaly present.  Cardiovascular: Normal rate, regular rhythm and normal heart sounds.   Pulmonary/Chest: Effort normal and breath sounds normal.  Abdominal: Soft. Bowel sounds are normal. She exhibits no mass. There is no tenderness.  Musculoskeletal: Normal range of motion. She exhibits no edema.  Lymphadenopathy:    She has no cervical adenopathy.  Neurological: She is alert and oriented to person, place, and time.  Skin: Skin is warm and dry. No rash noted.  Psychiatric: She has a normal mood and affect. Her behavior is normal.          Assessment & Plan:    Chronic pain  History of narcolepsy. Will refill Adderall  Chronic pain. Managed by chronic pain management  Anxiety insomnia. We'll discontinue temazepam and use alprazolam only   Laboratory at that reviewed

## 2012-02-24 ENCOUNTER — Telehealth: Payer: Self-pay | Admitting: Internal Medicine

## 2012-02-24 ENCOUNTER — Ambulatory Visit: Payer: Medicare Other | Admitting: Internal Medicine

## 2012-02-24 NOTE — Telephone Encounter (Signed)
Spoke with pt - labs stable/normal

## 2012-02-24 NOTE — Telephone Encounter (Signed)
Pt would like blood work results °

## 2012-03-12 ENCOUNTER — Ambulatory Visit: Payer: Medicare Other | Admitting: Internal Medicine

## 2012-03-31 ENCOUNTER — Other Ambulatory Visit: Payer: Self-pay | Admitting: Family Medicine

## 2012-04-14 ENCOUNTER — Telehealth: Payer: Self-pay | Admitting: *Deleted

## 2012-04-14 MED ORDER — TEMAZEPAM 30 MG PO CAPS: 30.0000 mg | ORAL_CAPSULE | Freq: Every evening | ORAL | Status: AC | PRN

## 2012-04-14 NOTE — Telephone Encounter (Signed)
Please advise -last seen 02/18/12 3 mos rov - was given xanax tid prn printed rx - now requesting temazepam 30mg  - was last written 06/2011 by dr. Scotty Court

## 2012-04-14 NOTE — Telephone Encounter (Signed)
Called in new rx for temazepam

## 2012-04-14 NOTE — Telephone Encounter (Signed)
ok 

## 2012-04-14 NOTE — Telephone Encounter (Signed)
Pt would like Dr. Kirtland Bouchard to send  in [facility] and she will pay for it.  The OTC drugs and Xanax do not work.

## 2012-05-19 ENCOUNTER — Encounter: Payer: Self-pay | Admitting: Internal Medicine

## 2012-05-19 ENCOUNTER — Ambulatory Visit (INDEPENDENT_AMBULATORY_CARE_PROVIDER_SITE_OTHER): Payer: Medicare Other | Admitting: Internal Medicine

## 2012-05-19 VITALS — BP 122/80 | Temp 97.8°F | Wt 141.0 lb

## 2012-05-19 DIAGNOSIS — G47419 Narcolepsy without cataplexy: Secondary | ICD-10-CM

## 2012-05-19 DIAGNOSIS — M545 Low back pain: Secondary | ICD-10-CM

## 2012-05-19 DIAGNOSIS — F341 Dysthymic disorder: Secondary | ICD-10-CM

## 2012-05-19 DIAGNOSIS — E785 Hyperlipidemia, unspecified: Secondary | ICD-10-CM

## 2012-05-19 MED ORDER — AMPHETAMINE-DEXTROAMPHETAMINE 30 MG PO TABS: 30.0000 mg | ORAL_TABLET | Freq: Two times a day (BID) | ORAL | Status: DC

## 2012-05-19 MED ORDER — DIPHENOXYLATE-ATROPINE 2.5-0.025 MG PO TABS: 2.0000 | ORAL_TABLET | Freq: Four times a day (QID) | ORAL | Status: DC | PRN

## 2012-05-19 NOTE — Progress Notes (Signed)
  Subjective:    Patient ID: Angelica Bell, female    DOB: Oct 21, 1932, 78 y.o.   MRN: [id number]  HPI  78 year old patient who is seen today for followup. She has a history of chronic low back pain and chronic pain syndrome and is followed by chronic pain management. She is requesting a refill on Lomotil do to episodic profuse diarrhea. She has a history of anxiety depression. She also has a history of memory impairment and has been placed on Aricept.  She had difficulties when she was grieving from the death of her husband but presently does not feel to have any memory deficit. 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 She also has been on chronic Adderall therapy    Review of Systems  Constitutional: Negative.   HENT: Negative for hearing loss, congestion, sore throat, rhinorrhea, dental problem, sinus pressure and tinnitus.   Eyes: Negative for pain, discharge and visual disturbance.  Respiratory: Negative for cough and shortness of breath.   Cardiovascular: Negative for chest pain, palpitations and leg swelling.  Gastrointestinal: Negative for nausea, vomiting, abdominal pain, diarrhea, constipation, blood in stool and abdominal distention.  Genitourinary: Negative for dysuria, urgency, frequency, hematuria, flank pain, vaginal bleeding, vaginal discharge, difficulty urinating, vaginal pain and pelvic pain.  Musculoskeletal: Positive for back pain. Negative for joint swelling, arthralgias and gait problem.  Skin: Negative for rash.  Neurological: Negative for dizziness, syncope, speech difficulty, weakness, numbness and headaches.  Hematological: Negative for adenopathy.  Psychiatric/Behavioral: Positive for sleep disturbance. Negative for behavioral problems, dysphoric mood and agitation. The patient is nervous/anxious.        Objective:   Physical Exam    Constitutional: She is oriented to person, place, and time. She appears well-developed and well-nourished.  HENT:  Head: Normocephalic.  Right Ear: External ear normal.  Left Ear: External ear normal.  Mouth/Throat: Oropharynx is clear and moist.  Eyes: Conjunctivae and EOM are normal. Pupils are equal, round, and reactive to light.  Neck: Normal range of motion. Neck supple. No thyromegaly present.  Cardiovascular: Normal rate, regular rhythm, normal heart sounds and intact distal pulses.   Pulmonary/Chest: Effort normal and breath sounds normal.  Abdominal: Soft. Bowel sounds are normal. She exhibits no mass. There is no tenderness.  Musculoskeletal: Normal range of motion.  Lymphadenopathy:    She has no cervical adenopathy.  Neurological: She is alert and oriented to person, place, and time.  Skin: Skin is warm and dry. No rash noted.  Psychiatric: She has a normal mood and affect. Her behavior is normal.          Assessment & Plan:   Anxiety depression stable History of cognitive dysfunction. 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 We'll give a trial off Aricept Dyslipidemia Chronic pain syndrome  Medications refilled

## 2012-05-19 NOTE — Patient Instructions (Signed)
Limit your sodium (Salt) intake    It is important that you exercise regularly, at least 20 minutes 3 to 4 times per week.  If you develop chest pain or shortness of breath seek  medical attention.  Return in 3 months for follow-up  

## 2012-07-01 ENCOUNTER — Telehealth: Payer: Self-pay | Admitting: Internal Medicine

## 2012-07-01 NOTE — Telephone Encounter (Signed)
Pt called and said that she is needing to get a refill of cephALEXin (KEFLEX) 500 MG capsule for a recurring round lump that comes up on pts bottom. Pt is req that [facility] [place]. Pt is aware that pcp is out of office and won't be back in until Friday. Pt is req that another doctor call in refill today and notify pt when done.

## 2012-07-03 MED ORDER — CEPHALEXIN 500 MG PO CAPS: 500.0000 mg | ORAL_CAPSULE | Freq: Four times a day (QID) | ORAL | Status: AC

## 2012-07-03 NOTE — Telephone Encounter (Signed)
Please advise 

## 2012-07-03 NOTE — Telephone Encounter (Signed)
done

## 2012-07-03 NOTE — Telephone Encounter (Signed)
Generic keflex 500  #28 one QID

## 2012-07-06 ENCOUNTER — Telehealth: Payer: Self-pay | Admitting: Internal Medicine

## 2012-07-06 NOTE — Telephone Encounter (Signed)
Caller: [facility]/Patient; PCP: Eleonore Chiquito; CB#: [phone]; ; ; Call regarding Lump;  Angelica Bell developed a lump on her bottom approximately 1 year ago.  Less than 6 months ago, was placed on an abx and the lump went away.  Lump returned on 07/03/12.  Is approx the size of a cherry.  Is not painful.  Has been unable to see it, therefore does not know if it is red.  Denies drainage.  Afebrile.  [facility]ing [facility] [phone].  Utilized Skin Lesions Guideline.  Home care disposition.  Please f/u with [facility] regarding abx request.

## 2012-07-06 NOTE — Telephone Encounter (Signed)
Please advise 

## 2012-07-06 NOTE — Telephone Encounter (Signed)
Generic keflex  500  #30 one QID

## 2012-07-06 NOTE — Telephone Encounter (Signed)
This was already done on Friday - [facility] to [facility] did [facility] - they have it and will fill for pick up today

## 2012-07-09 ENCOUNTER — Encounter: Payer: Self-pay | Admitting: Internal Medicine

## 2012-07-09 ENCOUNTER — Telehealth: Payer: Self-pay | Admitting: Internal Medicine

## 2012-07-09 ENCOUNTER — Ambulatory Visit (INDEPENDENT_AMBULATORY_CARE_PROVIDER_SITE_OTHER): Payer: Medicare Other | Admitting: Internal Medicine

## 2012-07-09 VITALS — BP 110/70 | Temp 98.1°F | Wt 132.0 lb

## 2012-07-09 DIAGNOSIS — F22 Delusional disorders: Secondary | ICD-10-CM

## 2012-07-09 DIAGNOSIS — F40298 Other specified phobia: Secondary | ICD-10-CM

## 2012-07-09 DIAGNOSIS — F341 Dysthymic disorder: Secondary | ICD-10-CM

## 2012-07-09 NOTE — Progress Notes (Signed)
  Subjective:    Patient ID: Angelica Bell, female    DOB: [birth year], 78 y.o.   MRN: [id number]  HPI  78 year old patient who has a history of mild anxiety and depression disorder as well as a history of mild memory impairment. She has chronic pain and is on chronic narcotic medications. She presents with a chief complaint of small blood infestation involving her eyes and scalp. Apparently she has had a recent dermatologic evaluation with normal findings. Apparent family members have not been able to identify any pathology    Review of Systems  Skin: Positive for rash.       Objective:   Physical Exam  Skin:       Normal examination No evidence of nits mites etc.          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  Patient was asked to follow up with dermatology a second and final visit to confirm absence of parasites.  Syndrome may be aggravated by chronic narcotic use

## 2012-07-09 NOTE — Patient Instructions (Signed)
Followup with dermatology a final visit if still concerns  Call or return to clinic prn if these symptoms worsen or fail to improve as anticipated.

## 2012-07-09 NOTE — Telephone Encounter (Signed)
Caller: [facility]/Patient; PCP: Eleonore Chiquito; CB#: [phone];  Call regarding Oblong Bugs with fish tail All Over Her Body. Live buts are tiny, red/pink,  Found in hair, eyebrows/lashes, in hole of pierced ears, inside abrasion at elbow, under fingernails, umbilicus. Onset: approx 06/18/12 to 06/25/12 (2-3 wks).  Can feel them crawling; kills them with H2O2. Saved some bugs to show MD. Dermatologist gave cleanser and Fluticasone cream without improvement.  Reports signs of infection (pain with redness) on R 5th finger.  Advised to see MD within 4 hrs for signs and symptoms of localized infection that are worsening with 24 hrs of home care per Insects or Spiders Bites and Stings Guideline.  Appt scheduled for 1015 07/09/12 with Dr Amador Cunas.

## 2012-08-05 ENCOUNTER — Encounter (HOSPITAL_COMMUNITY): Payer: Self-pay | Admitting: Emergency Medicine

## 2012-08-05 ENCOUNTER — Emergency Department (HOSPITAL_COMMUNITY)
Admission: EM | Admit: 2012-08-05 | Discharge: 2012-08-05 | Disposition: A | Discharge status: Home / Self Care | Payer: Medicare Other | Attending: Emergency Medicine | Admitting: Emergency Medicine

## 2012-08-05 DIAGNOSIS — IMO0002 Reserved for concepts with insufficient information to code with codable children: Secondary | ICD-10-CM | POA: Insufficient documentation

## 2012-08-05 DIAGNOSIS — E785 Hyperlipidemia, unspecified: Secondary | ICD-10-CM | POA: Insufficient documentation

## 2012-08-05 DIAGNOSIS — M1990 Unspecified osteoarthritis, unspecified site: Secondary | ICD-10-CM | POA: Insufficient documentation

## 2012-08-05 DIAGNOSIS — L0291 Cutaneous abscess, unspecified: Secondary | ICD-10-CM

## 2012-08-05 DIAGNOSIS — L0390 Cellulitis, unspecified: Secondary | ICD-10-CM

## 2012-08-05 DIAGNOSIS — M810 Age-related osteoporosis without current pathological fracture: Secondary | ICD-10-CM | POA: Insufficient documentation

## 2012-08-05 DIAGNOSIS — G8929 Other chronic pain: Secondary | ICD-10-CM | POA: Insufficient documentation

## 2012-08-05 DIAGNOSIS — Z87891 Personal history of nicotine dependence: Secondary | ICD-10-CM | POA: Insufficient documentation

## 2012-08-05 DIAGNOSIS — I10 Essential (primary) hypertension: Secondary | ICD-10-CM | POA: Insufficient documentation

## 2012-08-05 MED ORDER — DOXYCYCLINE HYCLATE 100 MG PO CAPS: 100.0000 mg | ORAL_CAPSULE | Freq: Two times a day (BID) | ORAL | Status: DC

## 2012-08-05 MED ORDER — DOXYCYCLINE HYCLATE 100 MG PO TABS
100.0000 mg | ORAL_TABLET | Freq: Once | ORAL | Status: AC
  Administered 2012-08-05: 100 mg via ORAL
  Filled 2012-08-05: qty 1

## 2012-08-05 NOTE — ED Notes (Signed)
Pt alert, arrives from home, c/o wound to elbows, pt resp even unlabored, skin pwd, large raised area to left elbow, white center noted

## 2012-08-05 NOTE — ED Provider Notes (Signed)
History     CSN: [id number]  Arrival date & time 08/05/12  2047   First MD Initiated Contact with Patient 08/05/12 2157      Chief Complaint  Patient presents with  . Wound Check    (Consider location/radiation/quality/duration/timing/severity/associated sxs/prior treatment) HPI Comments: Patient presents with 3 days of redness, pain to left elbow/forearm. She has been picking at it because she has seen bugs coming out and all over body. No treatments prior other than iodine. Patient denies fever, chills, nausea or vomiting. Onset was gradual. Course is constant. Nothing makes symptoms better or worse. Patient was at home with her grandson.   Patient is a 78 y.o. female presenting with rash. The history is provided by the patient.  Rash  This is a new problem. The current episode started more than 2 days ago. The problem has been gradually worsening. There has been no fever. The rash is present on the left arm. The pain is mild. The pain has been constant since onset. Associated symptoms include pain. Pertinent negatives include no blisters, no itching and no weeping. She has tried nothing for the symptoms.    Past Medical History  Diagnosis Date  . Osteoarthritis   . Osteoporosis   . Pneumonia   . Chronic pain disorder     with narcotic management  . Hyperlipidemia   . Hypertension   . Chronic back pain     with degenerative joint disease    Past Surgical History  Procedure Date  . Appendectomy   . Abdominal hysterectomy     No family history on file.  History  Substance Use Topics  . Smoking status: Former Games developer  . Smokeless tobacco: Never Used  . Alcohol Use: No    OB History    Grav Para Term Preterm Abortions TAB SAB Ect Mult Living                  Review of Systems  Constitutional: Negative for fever.  Gastrointestinal: Negative for nausea and vomiting.  Skin: Positive for color change and rash. Negative for itching.       Positive for abscess.    Hematological: Negative for adenopathy.    Allergies  Amoxicillin-pot clavulanate and Sulfonamide derivatives  Home Medications   Current Outpatient Rx  Name Route Sig Dispense Refill  . ALPRAZOLAM 0.25 MG PO TABS Oral Take 1 tablet (0.25 mg total) by mouth 3 (three) times daily as needed. For stress 90 tablet 5  . AMPHETAMINE-DEXTROAMPHETAMINE 30 MG PO TABS Oral Take 1 tablet (30 mg total) by mouth 2 (two) times daily. 60 tablet 0  . ASPIRIN 81 MG PO TABS Oral Take 81 mg by mouth daily.      . FENTANYL 100 MCG/HR TD PT72 Transdermal Place 1 patch onto the skin every 3 (three) days. Dr. Ethelene Hal      . ONE-DAILY MULTI VITAMINS PO TABS Oral Take 1 tablet by mouth daily.      Marland Kitchen FISH OIL 1200 MG PO CAPS Oral Take by mouth 2 (two) times daily.      . OXYCODONE HCL ER 30 MG PO TB12 Oral Take 30 mg by mouth every 12 (twelve) hours. Dr. Ethelene Hal     . PROCHLORPERAZINE MALEATE 10 MG PO TABS Oral Take 10 mg by mouth every 6 (six) hours as needed. As needed for nausea       . TEMAZEPAM 30 MG PO CAPS Oral Take 30 mg by mouth at bedtime as needed.    Marland Kitchen  VITAMIN B PLUS+ PO Oral Take by mouth.        BP 145/78  Pulse 95  Temp 98.4 F (36.9 C)  Resp 24  SpO2 98%  Physical Exam  Nursing note and vitals reviewed. Constitutional: She appears well-developed and well-nourished.  HENT:  Head: Normocephalic and atraumatic.  Eyes: Conjunctivae are normal.  Neck: Normal range of motion. Neck supple.  Pulmonary/Chest: No respiratory distress.  Musculoskeletal: She exhibits tenderness. She exhibits no edema.       Left elbow: Normal. She exhibits normal range of motion and no swelling.       Left forearm: She exhibits tenderness. She exhibits no swelling and no deformity.       Arms: Neurological: She is alert.  Skin: Skin is warm and dry. Rash noted.       Patient with small area of induration with several centimeters of surrounding redness consistent with cellulitis just distal to the left elbow on  left forearm. There is no streaking or lymphangitis noted. There is a small open area without drainage and without underlying fluctuance.  Psychiatric: She has a normal mood and affect.    ED Course  Procedures (including critical care time)  Labs Reviewed - No data to display  No results found.   1. Abscess   2. Cellulitis     10:32 PM Patient seen and examined. Medications ordered.   Vital signs reviewed and are as follows: Filed Vitals:   08/05/12 2123  BP: 145/78  Pulse: 95  Temp: 98.4 F (36.9 C)  Resp: 24   10:32 PM Pt urged to return with worsening pain, worsening swelling, expanding area of redness or streaking up extremity, fever, or any other concerns. Urged to take complete course of antibiotics as prescribed. Urged PCP f/u 3 days, return with worsening. Pt verbalizes understanding and agrees with plan.    MDM  Patients with area of cellulitis and possible nonfluctuant abscess to left forearm. This is likely secondary infection from excoriations. Patient has tactile hallucinations stating that she has bugs crawling on her skin. Otherwise patient is properly oriented and does not appear to be a danger to herself. She does not have any systemic symptoms, history of diabetes or other immune compromising states. Patient is appropriate for outpatient therapy and follow up with her primary care physician, return if worsening. Patient has family at home that to monitor infection.      Renne Crigler, [place] 08/06/12 0221

## 2012-08-06 NOTE — ED Provider Notes (Signed)
Medical screening examination/treatment/procedure(s) were performed by non-physician practitioner and as supervising physician I was immediately available for consultation/collaboration.   Loren Racer, MD 08/06/12 1600

## 2012-08-11 ENCOUNTER — Encounter: Payer: Self-pay | Admitting: Family

## 2012-08-11 ENCOUNTER — Ambulatory Visit (INDEPENDENT_AMBULATORY_CARE_PROVIDER_SITE_OTHER): Payer: Medicare Other | Admitting: Family

## 2012-08-11 ENCOUNTER — Other Ambulatory Visit: Payer: Self-pay | Admitting: Family

## 2012-08-11 VITALS — BP 174/84 | HR 100 | Temp 97.9°F | Wt 129.0 lb

## 2012-08-11 DIAGNOSIS — L0291 Cutaneous abscess, unspecified: Secondary | ICD-10-CM

## 2012-08-11 DIAGNOSIS — M79602 Pain in left arm: Secondary | ICD-10-CM

## 2012-08-11 DIAGNOSIS — L0390 Cellulitis, unspecified: Secondary | ICD-10-CM

## 2012-08-11 DIAGNOSIS — M79609 Pain in unspecified limb: Secondary | ICD-10-CM

## 2012-08-11 MED ORDER — CLINDAMYCIN HCL 300 MG PO CAPS: 300.0000 mg | ORAL_CAPSULE | Freq: Three times a day (TID) | ORAL | Status: DC

## 2012-08-11 NOTE — Patient Instructions (Addendum)

## 2012-08-11 NOTE — Progress Notes (Signed)
Subjective:    Patient ID: Angelica Bell, female    DOB: 1932-07-21, 78 y.o.   MRN: [id number]  HPI 78 yea old white female is in for a ED follow-up. She was seen for an abscess of the left arm. She has completed her doxycycline but continues to have redness to her left arm, tenderness, and swelling.    Review of Systems  Constitutional: Negative.   Respiratory: Negative.   Cardiovascular: Negative.   Skin:       Abscess to the left forearm  Neurological: Negative.   Hematological: Negative.   Psychiatric/Behavioral: Negative.    Past Medical History  Diagnosis Date  . Osteoarthritis   . Osteoporosis   . Pneumonia   . Chronic pain disorder     with narcotic management  . Hyperlipidemia   . Hypertension   . Chronic back pain     with degenerative joint disease    History   Social History  . Marital Status: Widowed    Spouse Name: N/A    Number of Children: N/A  . Years of Education: N/A   Occupational History  . Not on file.   Social History Main Topics  . Smoking status: Former Games developer  . Smokeless tobacco: Never Used  . Alcohol Use: No  . Drug Use: No  . Sexually Active: No   Other Topics Concern  . Not on file   Social History Narrative  . No narrative on file    Past Surgical History  Procedure Date  . Appendectomy   . Abdominal hysterectomy     No family history on file.  Allergies  Allergen Reactions  . Amoxicillin-Pot Clavulanate Diarrhea  . Sulfonamide Derivatives     Current Outpatient Prescriptions on File Prior to Visit  Medication Sig Dispense Refill  . ALPRAZolam (XANAX) 0.25 MG tablet Take 1 tablet (0.25 mg total) by mouth 3 (three) times daily as needed. For stress  90 tablet  5  . amphetamine-dextroamphetamine (ADDERALL, 30MG ,) 30 MG tablet Take 1 tablet (30 mg total) by mouth 2 (two) times daily.  60 tablet  0  . aspirin 81 MG tablet Take 81 mg by mouth daily.        . fentaNYL (DURAGESIC) 100 MCG/HR Place 1 patch onto the  skin every 3 (three) days. Dr. Ethelene Hal        . Multiple Vitamin (MULTIVITAMIN) tablet Take 1 tablet by mouth daily.        . Omega-3 Fatty Acids (FISH OIL) 1200 MG CAPS Take by mouth 2 (two) times daily.        Marland Kitchen oxycodone (OXYCONTIN) 30 MG TB12 Take 30 mg by mouth every 12 (twelve) hours. Dr. Ethelene Hal       . prochlorperazine (COMPAZINE) 10 MG tablet Take 10 mg by mouth every 6 (six) hours as needed. As needed for nausea         . temazepam (RESTORIL) 30 MG capsule Take 30 mg by mouth at bedtime as needed.      . Vit B6-Vit B12-Omega 3 Acids (VITAMIN B PLUS+ PO) Take by mouth.          BP 174/84  Pulse 100  Temp 97.9 F (36.6 C) (Oral)  Wt 129 lb (58.514 kg)  SpO2 96%chart    Objective:   Physical Exam  Constitutional: She is oriented to person, place, and time. She appears well-developed and well-nourished.  Neck: Normal range of motion. Neck supple.  Cardiovascular: Normal rate, regular rhythm and  normal heart sounds.   Pulmonary/Chest: Effort normal and breath sounds normal.  Neurological: She is alert and oriented to person, place, and time.  Skin: Skin is warm.       Skin abscess to the left forearm. Lanced.   Psychiatric: She has a normal mood and affect.    Informed consent obtained. Under sterile technique, 3 ccs of Lidocaine and Epi injected into the abscess. 1 cm incision made into the abscess expelling a moderate amount of purulent drainage. Cleansed, Dressed and bandaged.       Assessment & Plan:  Assessment: Abscess, Right arm pain  Plan: Clindamycin three times a day x 7 days. Return if symptoms worsen or persist. Otherwise as scheduled with PCP.

## 2012-08-14 LAB — WOUND CULTURE
Gram Stain: NONE SEEN
Gram Stain: NONE SEEN

## 2012-08-17 ENCOUNTER — Encounter: Payer: Self-pay | Admitting: Family Medicine

## 2012-08-28 ENCOUNTER — Other Ambulatory Visit: Payer: Self-pay

## 2012-08-28 ENCOUNTER — Encounter: Payer: Self-pay | Admitting: Internal Medicine

## 2012-08-28 ENCOUNTER — Ambulatory Visit (INDEPENDENT_AMBULATORY_CARE_PROVIDER_SITE_OTHER): Payer: Medicare Other | Admitting: Internal Medicine

## 2012-08-28 VITALS — BP 130/80 | Temp 98.1°F | Wt 127.0 lb

## 2012-08-28 DIAGNOSIS — F341 Dysthymic disorder: Secondary | ICD-10-CM

## 2012-08-28 DIAGNOSIS — L0390 Cellulitis, unspecified: Secondary | ICD-10-CM

## 2012-08-28 DIAGNOSIS — L0291 Cutaneous abscess, unspecified: Secondary | ICD-10-CM

## 2012-08-28 DIAGNOSIS — M1990 Unspecified osteoarthritis, unspecified site: Secondary | ICD-10-CM

## 2012-08-28 DIAGNOSIS — G5790 Unspecified mononeuropathy of unspecified lower limb: Secondary | ICD-10-CM

## 2012-08-28 MED ORDER — AMPHETAMINE-DEXTROAMPHET ER 20 MG PO CP24: 20.0000 mg | ORAL_CAPSULE | ORAL | Status: DC

## 2012-08-28 MED ORDER — CEPHALEXIN 500 MG PO CAPS: 500.0000 mg | ORAL_CAPSULE | Freq: Three times a day (TID) | ORAL | Status: AC

## 2012-08-28 MED ORDER — ALPRAZOLAM 0.25 MG PO TABS: 0.2500 mg | ORAL_TABLET | Freq: Three times a day (TID) | ORAL | Status: DC | PRN

## 2012-08-28 MED ORDER — DONEPEZIL HCL 5 MG PO TABS
5.0000 mg | ORAL_TABLET | Freq: Every evening | ORAL | Status: DC | PRN
Start: 2012-08-28 — End: 2013-04-15

## 2012-08-28 NOTE — Telephone Encounter (Signed)
Ax refill request from [facility] - for alprazolam Last written 01/2012  #[id number] Please advise

## 2012-08-28 NOTE — Patient Instructions (Signed)
Take your antibiotic as prescribed until ALL of it is gone, but stop if you develop a rash, swelling, or any side effects of the medication.  Contact our office as soon as possible if  there are side effects of the medication.Cellulitis Cellulitis is an infection of the tissue under the skin. The infected area is usually red and tender. This is caused by germs. These germs enter the body through cuts or sores. This usually happens in the arms or lower legs. HOME CARE    Take your medicine as told. Finish it even if you start to feel better.   If the infection is on the arm or leg, keep it raised (elevated).   Use a warm cloth on the infected area several times a day.   See your doctor for a follow-up visit as told.  GET HELP RIGHT AWAY IF:    You are tired or confused.   You throw up (vomit).   You have watery poop (diarrhea).   You feel ill and have muscle aches.   You have a fever.  MAKE SURE YOU:    Understand these instructions.   Will watch your condition.   Will get help right away if you are not doing well or get worse.  Document Released: 06/03/2008 Document Revised: 12/05/2011 Document Reviewed: 11/17/2009 [facility] Patient Information 2012 [employer], [place].

## 2012-08-28 NOTE — Telephone Encounter (Signed)
Done and pt has rx. KIK

## 2012-08-28 NOTE — Progress Notes (Signed)
Subjective:    Patient ID: Angelica Bell, female    DOB: 1932-06-14, 78 y.o.   MRN: [id number]  HPI  78 year old patient who is seen today for followup. She is followed by pain management do to osteoarthritis with peripheral neuropathy and chronic pain. Medical regimen includes narcotics as well as Adderall. 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 Today she seems a bit anxious and hyper. She complains of increasing swelling and discomfort involving her left lower arm. She had cellulitis with abscess the required an I&D last month. She was treated with doxycycline. She has been intolerant to Augmentin in the past due to diarrhea.  Medical regimen reviewed. This includes Adderall that she apparently takes for daytime sleepiness. We'll attempt to taper and discontinue this medication  She also is requesting a refill on Aricept this has been prescribed in the past for memory dysfunction and she and her family feel she did much better on this medication  Past Medical History  Diagnosis Date  . Osteoarthritis   . Osteoporosis   . Pneumonia   . Chronic pain disorder     with narcotic management  . Hyperlipidemia   . Hypertension   . Chronic back pain     with degenerative joint disease    History   Social History  . Marital Status: Widowed    Spouse Name: N/A    Number of Children: N/A  . Years of Education: N/A   Occupational History  . Not on file.   Social History Main Topics  . Smoking status: Former Games developer  . Smokeless tobacco: Never Used  . Alcohol Use: No  . Drug Use: No  . Sexually Active: No   Other Topics Concern  . Not on file   Social History Narrative  . No narrative on file    Past Surgical History  Procedure Date  . Appendectomy   . Abdominal hysterectomy     No family history on file.  Allergies  Allergen Reactions  . Amoxicillin-Pot Clavulanate Diarrhea  . Sulfonamide Derivatives     Current Outpatient  Prescriptions on File Prior to Visit  Medication Sig Dispense Refill  . ALPRAZolam (XANAX) 0.25 MG tablet Take 1 tablet (0.25 mg total) by mouth 3 (three) times daily as needed. For stress  90 tablet  5  . amphetamine-dextroamphetamine (ADDERALL, 30MG ,) 30 MG tablet Take 1 tablet (30 mg total) by mouth 2 (two) times daily.  60 tablet  0  . aspirin 81 MG tablet Take 81 mg by mouth daily.        . fentaNYL (DURAGESIC) 100 MCG/HR Place 1 patch onto the skin every 3 (three) days. Dr. Ethelene Hal        . Multiple Vitamin (MULTIVITAMIN) tablet Take 1 tablet by mouth daily.        . Omega-3 Fatty Acids (FISH OIL) 1200 MG CAPS Take by mouth 2 (two) times daily.        Marland Kitchen oxycodone (OXYCONTIN) 30 MG TB12 Take 30 mg by mouth every 12 (twelve) hours. Dr. Ethelene Hal       . prochlorperazine (COMPAZINE) 10 MG tablet Take 10 mg by mouth every 6 (six) hours as needed. As needed for nausea         . temazepam (RESTORIL) 30 MG capsule Take 30 mg by mouth at bedtime as needed.      . Vit B6-Vit B12-Omega 3 Acids (VITAMIN B PLUS+ PO) Take by mouth.  BP 130/80  Temp 98.1 F (36.7 C) (Oral)  Wt 127 lb (57.607 kg)      Review of Systems  Constitutional: Negative.   HENT: Negative for hearing loss, congestion, sore throat, rhinorrhea, dental problem, sinus pressure and tinnitus.   Eyes: Negative for pain, discharge and visual disturbance.  Respiratory: Negative for cough and shortness of breath.   Cardiovascular: Negative for chest pain, palpitations and leg swelling.  Gastrointestinal: Negative for nausea, vomiting, abdominal pain, diarrhea, constipation, blood in stool and abdominal distention.  Genitourinary: Negative for dysuria, urgency, frequency, hematuria, flank pain, vaginal bleeding, vaginal discharge, difficulty urinating, vaginal pain and pelvic pain.  Musculoskeletal: Negative for joint swelling, arthralgias and gait problem.  Skin: Positive for wound. Negative for rash.  Neurological: Negative  for dizziness, syncope, speech difficulty, weakness, numbness and headaches.  Hematological: Negative for adenopathy.  Psychiatric/Behavioral: Negative for behavioral problems, dysphoric mood and agitation. The patient is not nervous/anxious.        Objective:   Physical Exam  Constitutional:       No distress Afebrile A bit hyperactive  Skin:       A clean approximate 6  mm incision involving her left lower arm near the elbow;  there was a area of 2 x 3 cm about the incision that was slightly tender and swollen. No real fluctuance or drainage          Assessment & Plan:   Early recurrent cellulitis L elbow.  Wound care discussed- will treat with keflex

## 2013-04-15 ENCOUNTER — Inpatient Hospital Stay (HOSPITAL_COMMUNITY)
Admission: EM | Admit: 2013-04-15 | Discharge: 2013-04-17 | DRG: 195 | Disposition: A | Discharge status: Home Health | Payer: Medicare Other | Attending: Internal Medicine | Admitting: Internal Medicine

## 2013-04-15 ENCOUNTER — Encounter (HOSPITAL_COMMUNITY): Payer: Self-pay | Admitting: *Deleted

## 2013-04-15 ENCOUNTER — Emergency Department (HOSPITAL_COMMUNITY): Payer: Medicare Other

## 2013-04-15 DIAGNOSIS — F411 Generalized anxiety disorder: Secondary | ICD-10-CM | POA: Diagnosis present

## 2013-04-15 DIAGNOSIS — F3289 Other specified depressive episodes: Secondary | ICD-10-CM | POA: Diagnosis present

## 2013-04-15 DIAGNOSIS — M545 Low back pain, unspecified: Secondary | ICD-10-CM

## 2013-04-15 DIAGNOSIS — M1990 Unspecified osteoarthritis, unspecified site: Secondary | ICD-10-CM

## 2013-04-15 DIAGNOSIS — N959 Unspecified menopausal and perimenopausal disorder: Secondary | ICD-10-CM

## 2013-04-15 DIAGNOSIS — F341 Dysthymic disorder: Secondary | ICD-10-CM

## 2013-04-15 DIAGNOSIS — I10 Essential (primary) hypertension: Secondary | ICD-10-CM | POA: Diagnosis present

## 2013-04-15 DIAGNOSIS — G4700 Insomnia, unspecified: Secondary | ICD-10-CM

## 2013-04-15 DIAGNOSIS — G8929 Other chronic pain: Secondary | ICD-10-CM | POA: Diagnosis present

## 2013-04-15 DIAGNOSIS — E876 Hypokalemia: Secondary | ICD-10-CM

## 2013-04-15 DIAGNOSIS — G5790 Unspecified mononeuropathy of unspecified lower limb: Secondary | ICD-10-CM

## 2013-04-15 DIAGNOSIS — M4180 Other forms of scoliosis, site unspecified: Secondary | ICD-10-CM

## 2013-04-15 DIAGNOSIS — E785 Hyperlipidemia, unspecified: Secondary | ICD-10-CM

## 2013-04-15 DIAGNOSIS — G47419 Narcolepsy without cataplexy: Secondary | ICD-10-CM

## 2013-04-15 DIAGNOSIS — J040 Acute laryngitis: Secondary | ICD-10-CM

## 2013-04-15 DIAGNOSIS — Z79899 Other long term (current) drug therapy: Secondary | ICD-10-CM

## 2013-04-15 DIAGNOSIS — M810 Age-related osteoporosis without current pathological fracture: Secondary | ICD-10-CM

## 2013-04-15 DIAGNOSIS — F329 Major depressive disorder, single episode, unspecified: Secondary | ICD-10-CM | POA: Diagnosis present

## 2013-04-15 DIAGNOSIS — J189 Pneumonia, unspecified organism: Principal | ICD-10-CM

## 2013-04-15 DIAGNOSIS — N907 Vulvar cyst: Secondary | ICD-10-CM

## 2013-04-15 LAB — CBC WITH DIFFERENTIAL/PLATELET
Basophils Absolute: 0.1 K/uL (ref 0.0–0.1)
Basophils Relative: 0 % (ref 0–1)
Eosinophils Absolute: 0.2 K/uL (ref 0.0–0.7)
Eosinophils Relative: 1 % (ref 0–5)
HCT: 35 % — ABNORMAL LOW (ref 36.0–46.0)
Hemoglobin: 11.9 g/dL — ABNORMAL LOW (ref 12.0–15.0)
Lymphocytes Relative: 14 % (ref 12–46)
Lymphs Abs: 2.2 10*3/uL (ref 0.7–4.0)
MCH: 28.5 pg (ref 26.0–34.0)
MCHC: 34 g/dL (ref 30.0–36.0)
MCV: 83.9 fL (ref 78.0–100.0)
Monocytes Absolute: 1.5 10*3/uL — ABNORMAL HIGH (ref 0.1–1.0)
Monocytes Relative: 9 % (ref 3–12)
Neutro Abs: 12.3 10*3/uL — ABNORMAL HIGH (ref 1.7–7.7)
Neutrophils Relative %: 76 % (ref 43–77)
Platelets: 291 K/uL (ref 150–400)
RBC: 4.17 MIL/uL (ref 3.87–5.11)
RDW: 13.1 % (ref 11.5–15.5)
WBC: 16.3 10*3/uL — ABNORMAL HIGH (ref 4.0–10.5)

## 2013-04-15 LAB — BASIC METABOLIC PANEL
CO2: 29 mEq/L (ref 19–32)
Calcium: 9.1 mg/dL (ref 8.4–10.5)
Chloride: 97 mEq/L (ref 96–112)
Creatinine, Ser: 0.59 mg/dL (ref 0.50–1.10)
Glucose, Bld: 122 mg/dL — ABNORMAL HIGH (ref 70–99)

## 2013-04-15 LAB — BASIC METABOLIC PANEL WITH GFR
BUN: 13 mg/dL (ref 6–23)
GFR calc Af Amer: >90 mL/min (ref >90)
GFR calc non Af Amer: 85 mL/min — ABNORMAL LOW (ref >90)
Potassium: 2.9 meq/L — ABNORMAL LOW (ref 3.5–5.1)
Sodium: 135 meq/L (ref 135–145)

## 2013-04-15 LAB — CG4 I-STAT (LACTIC ACID): Lactic Acid, Venous: 1.2 mmol/L (ref 0.5–2.2)

## 2013-04-15 MED ORDER — IPRATROPIUM BROMIDE 0.02 % IN SOLN: 0.5000 mg | Freq: Four times a day (QID) | RESPIRATORY_TRACT | Status: DC

## 2013-04-15 MED ORDER — DEXTROSE 5 % IV SOLN
1.0000 g | Freq: Every day | INTRAVENOUS | Status: DC
  Administered 2013-04-16: 1 g via INTRAVENOUS
  Filled 2013-04-15 (×3): qty 10

## 2013-04-15 MED ORDER — SODIUM CHLORIDE 0.9 % IV SOLN
INTRAVENOUS | Status: DC
  Administered 2013-04-16: via INTRAVENOUS

## 2013-04-15 MED ORDER — AMPHETAMINE-DEXTROAMPHET ER 20 MG PO CP24: 20.0000 mg | ORAL_CAPSULE | Freq: Every day | ORAL | Status: DC | PRN

## 2013-04-15 MED ORDER — ENOXAPARIN SODIUM 40 MG/0.4ML ~~LOC~~ SOLN
40.0000 mg | Freq: Every day | SUBCUTANEOUS | Status: DC
Start: 2013-04-15 — End: 2013-04-17
  Administered 2013-04-16 (×2): 40 mg via SUBCUTANEOUS
  Filled 2013-04-15 (×3): qty 0.4

## 2013-04-15 MED ORDER — POTASSIUM CHLORIDE CRYS ER 20 MEQ PO TBCR
40.0000 meq | EXTENDED_RELEASE_TABLET | Freq: Two times a day (BID) | ORAL | Status: AC
  Administered 2013-04-16 (×2): 40 meq via ORAL
  Filled 2013-04-15 (×2): qty 2

## 2013-04-15 MED ORDER — LEVOFLOXACIN IN D5W 750 MG/150ML IV SOLN
750.0000 mg | Freq: Once | INTRAVENOUS | Status: AC
  Administered 2013-04-15: 750 mg via INTRAVENOUS
  Filled 2013-04-15: qty 150

## 2013-04-15 MED ORDER — ALBUTEROL SULFATE (5 MG/ML) 0.5% IN NEBU
2.5000 mg | INHALATION_SOLUTION | Freq: Four times a day (QID) | RESPIRATORY_TRACT | Status: DC
  Filled 2013-04-15: qty 0.5

## 2013-04-15 MED ORDER — ASPIRIN EC 81 MG PO TBEC
81.0000 mg | DELAYED_RELEASE_TABLET | Freq: Every day | ORAL | Status: DC
  Administered 2013-04-16 – 2013-04-17 (×2): 81 mg via ORAL
  Filled 2013-04-15 (×2): qty 1

## 2013-04-15 MED ORDER — SODIUM CHLORIDE 0.9 % IV SOLN
1000.0000 mL | INTRAVENOUS | Status: DC
  Administered 2013-04-15: 1000 mL via INTRAVENOUS

## 2013-04-15 MED ORDER — POTASSIUM CHLORIDE CRYS ER 20 MEQ PO TBCR
40.0000 meq | EXTENDED_RELEASE_TABLET | Freq: Once | ORAL | Status: AC
  Administered 2013-04-15: 40 meq via ORAL
  Filled 2013-04-15: qty 2

## 2013-04-15 MED ORDER — ALPRAZOLAM 0.25 MG PO TABS
0.2500 mg | ORAL_TABLET | Freq: Three times a day (TID) | ORAL | Status: DC | PRN
  Administered 2013-04-16 – 2013-04-17 (×2): 0.25 mg via ORAL
  Filled 2013-04-15 (×3): qty 1

## 2013-04-15 MED ORDER — ONDANSETRON HCL 4 MG/2ML IJ SOLN
4.0000 mg | Freq: Four times a day (QID) | INTRAMUSCULAR | Status: DC | PRN
  Administered 2013-04-16: 4 mg via INTRAVENOUS
  Filled 2013-04-15: qty 2

## 2013-04-15 MED ORDER — ALBUTEROL SULFATE (5 MG/ML) 0.5% IN NEBU
5.0000 mg | INHALATION_SOLUTION | Freq: Once | RESPIRATORY_TRACT | Status: AC
  Administered 2013-04-15: 5 mg via RESPIRATORY_TRACT
  Filled 2013-04-15: qty 1

## 2013-04-15 MED ORDER — FENTANYL 100 MCG/HR TD PT72
100.0000 ug | MEDICATED_PATCH | TRANSDERMAL | Status: DC
  Administered 2013-04-16: 100 ug via TRANSDERMAL
  Filled 2013-04-15: qty 1

## 2013-04-15 MED ORDER — ALBUTEROL SULFATE (5 MG/ML) 0.5% IN NEBU: 2.5000 mg | INHALATION_SOLUTION | Freq: Four times a day (QID) | RESPIRATORY_TRACT | Status: DC

## 2013-04-15 MED ORDER — METHYLPREDNISOLONE SODIUM SUCC 125 MG IJ SOLR
125.0000 mg | Freq: Once | INTRAMUSCULAR | Status: AC
  Administered 2013-04-15: 125 mg via INTRAVENOUS
  Filled 2013-04-15: qty 2

## 2013-04-15 MED ORDER — FLUOROMETHOLONE 0.1 % OP SUSP
1.0000 [drp] | Freq: Every day | OPHTHALMIC | Status: DC
  Administered 2013-04-16 – 2013-04-17 (×2): 1 [drp] via OPHTHALMIC
  Filled 2013-04-15: qty 5

## 2013-04-15 MED ORDER — SODIUM CHLORIDE 0.9 % IV SOLN
1000.0000 mL | Freq: Once | INTRAVENOUS | Status: AC
  Administered 2013-04-15: 1000 mL via INTRAVENOUS

## 2013-04-15 MED ORDER — ONDANSETRON HCL 4 MG PO TABS: 4.0000 mg | ORAL_TABLET | Freq: Four times a day (QID) | ORAL | Status: DC | PRN

## 2013-04-15 MED ORDER — ZOLPIDEM TARTRATE 5 MG PO TABS
5.0000 mg | ORAL_TABLET | Freq: Every evening | ORAL | Status: DC | PRN
  Administered 2013-04-16 (×2): 5 mg via ORAL
  Filled 2013-04-15 (×2): qty 1

## 2013-04-15 MED ORDER — ACETAMINOPHEN 325 MG PO TABS
650.0000 mg | ORAL_TABLET | ORAL | Status: DC | PRN
  Filled 2013-04-15: qty 2

## 2013-04-15 MED ORDER — DEXTROSE 5 % IV SOLN
500.0000 mg | Freq: Every day | INTRAVENOUS | Status: DC
  Administered 2013-04-16 (×2): 500 mg via INTRAVENOUS
  Filled 2013-04-15 (×3): qty 500

## 2013-04-15 MED ORDER — OXYCODONE HCL ER 10 MG PO T12A: 30.0000 mg | EXTENDED_RELEASE_TABLET | Freq: Two times a day (BID) | ORAL | Status: DC | PRN

## 2013-04-15 MED ORDER — DONEPEZIL HCL 5 MG PO TABS
5.0000 mg | ORAL_TABLET | Freq: Every day | ORAL | Status: DC
  Administered 2013-04-16 (×2): 5 mg via ORAL
  Filled 2013-04-15 (×4): qty 1

## 2013-04-15 NOTE — ED Provider Notes (Addendum)
History     CSN: [id number]  Arrival date & time 04/15/13  1908   First MD Initiated Contact with Patient 04/15/13 1911      Chief Complaint  Patient presents with  . Cough    (Consider location/radiation/quality/duration/timing/severity/associated sxs/prior treatment) HPI Comments: Pt is sent here from [facility] urgent care by EMS after diagnosing pneumonia.  Pt doesn't smoke, has no h/o asthma, emphysema, has been coughing and is quite hoarse, dry paroxysmal cough for several days.  Pt reports very fatigued, easily gets SOB with exertion, poor appetite.  Pt reports lives with grandson who works during the day.  No N/V/D.  Denies any chest pain or tighness.  No difficulty swallowing.  Sats at urgent care reported to be 92% on RA.    Patient is a 79 y.o. female presenting with cough. The history is provided by the patient.  Cough Associated symptoms: chills and shortness of breath   Associated symptoms: no chest pain, no headaches and no rhinorrhea     Past Medical History  Diagnosis Date  . Osteoarthritis   . Osteoporosis   . Pneumonia   . Chronic pain disorder     with narcotic management  . Hyperlipidemia   . Hypertension   . Chronic back pain     with degenerative joint disease    Past Surgical History  Procedure Laterality Date  . Appendectomy    . Abdominal hysterectomy      History reviewed. No pertinent family history.  History  Substance Use Topics  . Smoking status: Former Smoker -- 13 years    Quit date: 04/15/1976  . Smokeless tobacco: Never Used  . Alcohol Use: No    OB History   Grav Para Term Preterm Abortions TAB SAB Ect Mult Living                  Review of Systems  Constitutional: Positive for chills, appetite change and fatigue.  HENT: Positive for voice change. Negative for congestion, rhinorrhea and trouble swallowing.   Respiratory: Positive for cough and shortness of breath.   Cardiovascular: Negative for chest pain.   Gastrointestinal: Negative for nausea, vomiting and abdominal pain.  Neurological: Positive for weakness. Negative for syncope, light-headedness and headaches.  All other systems reviewed and are negative.    Allergies  Amoxicillin-pot clavulanate and Sulfonamide derivatives  Home Medications   Current Outpatient Rx  Name  Route  Sig  Dispense  Refill  . ALPRAZolam (XANAX) 0.25 MG tablet   Oral   Take 0.25 mg by mouth 3 (three) times daily as needed for anxiety.         Marland Kitchen amphetamine-dextroamphetamine (ADDERALL XR) 20 MG 24 hr capsule   Oral   Take 20 mg by mouth daily as needed (for days when pt is driving.).         Marland Kitchen aspirin EC 81 MG tablet   Oral   Take 81 mg by mouth daily.         Marland Kitchen donepezil (ARICEPT) 5 MG tablet   Oral   Take 5 mg by mouth at bedtime.         . fentaNYL (DURAGESIC) 100 MCG/HR   Transdermal   Place 1 patch onto the skin every other day. Dr. Ethelene Hal          . fluorometholone (FML) 0.1 % ophthalmic suspension   Both Eyes   Place 1 drop into both eyes daily.         Marland Kitchen  Multiple Vitamin (MULTIVITAMIN WITH MINERALS) TABS   Oral   Take 1 tablet by mouth daily.         Marland Kitchen omega-3 acid ethyl esters (LOVAZA) 1 G capsule   Oral   Take 1 g by mouth 2 (two) times daily.         Marland Kitchen oxycodone (OXYCONTIN) 30 MG TB12   Oral   Take 30 mg by mouth 2 (two) times daily as needed (for pain.).          Marland Kitchen temazepam (RESTORIL) 30 MG capsule   Oral   Take 30 mg by mouth at bedtime as needed for sleep.            BP 158/82  Pulse 93  Temp(Src) 99.2 F (37.3 C) (Oral)  Resp 25  SpO2 96%  Physical Exam  Nursing note and vitals reviewed. Constitutional: She is oriented to person, place, and time. She appears well-developed and well-nourished.  HENT:  Head: Normocephalic and atraumatic.  Eyes: Conjunctivae and EOM are normal. No scleral icterus.  Neck: Neck supple.  Intermittent loss of voice, hoarse  Cardiovascular: Normal rate and  regular rhythm.   No murmur heard. Pulmonary/Chest: Effort normal. She has wheezes.  Paroxysmal dry cough  Abdominal: Soft. She exhibits no distension. There is no tenderness. There is no rebound and no guarding.  Musculoskeletal: She exhibits no edema.  Neurological: She is alert and oriented to person, place, and time. Coordination normal.  Skin: Skin is warm. No rash noted.  Psychiatric: She has a normal mood and affect.    ED Course  Procedures (including critical care time)  Labs Reviewed  CBC WITH DIFFERENTIAL - Abnormal; Notable for the following:    WBC 16.3 (*)    Hemoglobin 11.9 (*)    HCT 35.0 (*)    Neutro Abs 12.3 (*)    Monocytes Absolute 1.5 (*)    All other components within normal limits  BASIC METABOLIC PANEL - Abnormal; Notable for the following:    Potassium 2.9 (*)    Glucose, Bld 122 (*)    GFR calc non Af Amer 85 (*)    All other components within normal limits  CG4 I-STAT (LACTIC ACID)   Dg Chest 2 View  04/15/2013  *RADIOLOGY REPORT*  Clinical Data: Cough, weakness, shortness of breath.  CHEST - 2 VIEW  Comparison: 02/23/2007  Findings: There is extensive infiltrate throughout the left lung. Right lung is clear.  Heart size and vascularity are normal. Fairly severe thoracolumbar scoliosis.  IMPRESSION: Extensive infiltrate in the left lung consistent with pneumonia.   Original Report Authenticated By: Francene Boyers, M.D.      1. Community acquired pneumonia   2. Laryngitis   3. Hypokalemia     Sat of 96% on [place] O2 is adequate by my interpretation   9:18 PM Reivew of CXR shows sig left lung pneumonia, WBC is elevated at 16, will admit to the hospital and continue nebs, steroids, abx.     9:30 PM K+ is low at 2.9, give oral replacement, check ECG.  Spoke to Dr. Eben Burow with [facility] who will admit.    ECG at time 21:31 shows SR at rate 95, borderline LAD, incomplete RBBB.  Abn ECG.  MDM  Pt with apparently diagnosed CAP, relative hypoexemia on RA  at the urgent care.  Will give nebs, steroids, repeat CXR here, labs and consider admission.          Gavin Pound. Oletta Lamas, MD 04/15/13 2130  Gavin Pound. Oletta Lamas, MD 04/15/13 2140

## 2013-04-15 NOTE — H&P (Signed)
History and Physical  Angelica Bell:[id number] DOB: August 30, 1932 DOA: 04/15/2013  Referring physician: Dr. Oletta Lamas PCP: Jeani Hawking, MD   Chief Complaint: Cough  HPI: Patient is a 79 year old female with past medical history as noted below who has been coughing for last 3 weeks. Patient also complains of decreased by mouth intake. Cough has been associated with sputum which is yellow in color. No blood noted . Cough has been associated with mild shortness of breath. Patient denies any fevers but has been having chills at night since last 3 weeks . Patient has tried over-the-counter medications which did not improve her condition . Finally on the insistence of her grandson she visited an urgent care Center today. Pt is sent here from [facility] urgent care by EMS after diagnosing pneumonia. Pt doesn't smoke, has no h/o asthma, emphysema, patient also has hoarse voice. Pt reports very fatigued, easily gets SOB with exertion, poor appetite. Pt reports lives with grandson who works during the day. No N/V/D. Denies any chest pain or tighness. No difficulty swallowing. Sats at urgent care reported to be 92% on RA.  Review of Systems:  15 point review of system is negative except what is noted above.  Past Medical History  Diagnosis Date  . Osteoarthritis   . Osteoporosis   . Pneumonia   . Chronic pain disorder     with narcotic management  . Hyperlipidemia   . Hypertension   . Chronic back pain     with degenerative joint disease    Past Surgical History  Procedure Laterality Date  . Appendectomy    . Abdominal hysterectomy      Social History:  reports that she quit smoking about 37 years ago. She has never used smokeless tobacco. She reports that she does not drink alcohol or use illicit drugs.  Allergies  Allergen Reactions  . Amoxicillin-Pot Clavulanate Diarrhea  . Sulfonamide Derivatives     History reviewed. No pertinent family history.   Prior to Admission medications    Medication Sig Start Date End Date Taking? Authorizing Provider  ALPRAZolam (XANAX) 0.25 MG tablet Take 0.25 mg by mouth 3 (three) times daily as needed for anxiety.   Yes Historical Provider, MD  amphetamine-dextroamphetamine (ADDERALL XR) 20 MG 24 hr capsule Take 20 mg by mouth daily as needed (for days when pt is driving.).   Yes Historical Provider, MD  aspirin EC 81 MG tablet Take 81 mg by mouth daily.   Yes Historical Provider, MD  donepezil (ARICEPT) 5 MG tablet Take 5 mg by mouth at bedtime.   Yes Historical Provider, MD  fentaNYL (DURAGESIC) 100 MCG/HR Place 1 patch onto the skin every other day. Dr. Ethelene Hal    Yes Historical Provider, MD  fluorometholone (FML) 0.1 % ophthalmic suspension Place 1 drop into both eyes daily.   Yes Historical Provider, MD  Multiple Vitamin (MULTIVITAMIN WITH MINERALS) TABS Take 1 tablet by mouth daily.   Yes Historical Provider, MD  omega-3 acid ethyl esters (LOVAZA) 1 G capsule Take 1 g by mouth 2 (two) times daily.   Yes Historical Provider, MD  oxycodone (OXYCONTIN) 30 MG TB12 Take 30 mg by mouth 2 (two) times daily as needed (for pain.).    Yes Historical Provider, MD  temazepam (RESTORIL) 30 MG capsule Take 30 mg by mouth at bedtime as needed for sleep.    Yes Historical Provider, MD   Physical Exam: Filed Vitals:   04/15/13 1933  BP: 158/82  Pulse: 93  Temp: 99.2 F (37.3 C)  TempSrc: Oral  Resp: 25  SpO2: 96%   Physical Exam: General: Vital signs reviewed and noted. Well-developed, well-nourished, in no acute distress; alert, appropriate and cooperative throughout examination.  Head: Normocephalic, atraumatic.  Eyes: PERRL, EOMI, No signs of anemia or jaundince.  Nose: Mucous membranes moist, not inflammed, nonerythematous.  Throat: Oropharynx nonerythematous, no exudate appreciated.   Neck: No deformities, masses, or tenderness noted.Supple, No carotid Bruits, no JVD.  Lungs:  Normal respiratory effort. Bibasilar crackles left more than  right, no wheezing or rhonchi   Heart: RRR. S1 and S2 normal without gallop, murmur, or rubs.  Abdomen:  BS normoactive. Soft, Nondistended, non-tender.  No masses or organomegaly.  Extremities: No pretibial edema.  Neurologic: A&O X3, CN II - XII are grossly intact. Motor strength is 5/5 in the all 4 extremities, Sensations intact to light touch, Cerebellar signs negative.  Skin: No visible rashes, scars.     Wt Readings from Last 3 Encounters:  08/28/12 127 lb (57.607 kg)  08/11/12 129 lb (58.514 kg)  07/09/12 132 lb (59.875 kg)    Labs on Admission:  Basic Metabolic Panel:  Recent Labs Lab 04/15/13 2010  NA 135  K 2.9*  CL 97  CO2 29  GLUCOSE 122*  BUN 13  CREATININE 0.59  CALCIUM 9.1    CBC:  Recent Labs Lab 04/15/13 2010  WBC 16.3*  NEUTROABS 12.3*  HGB 11.9*  HCT 35.0*  MCV 83.9  PLT 291    Radiological Exams on Admission: Dg Chest 2 View  04/15/2013  *RADIOLOGY REPORT*  Clinical Data: Cough, weakness, shortness of breath.  CHEST - 2 VIEW  Comparison: 02/23/2007  Findings: There is extensive infiltrate throughout the left lung. Right lung is clear.  Heart size and vascularity are normal. Fairly severe thoracolumbar scoliosis.  IMPRESSION: Extensive infiltrate in the left lung consistent with pneumonia.   Original Report Authenticated By: Francene Boyers, M.D.     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  -Admit to telemetry bed -IV ceftriaxone and azithromycin -Dounebs every 6 hours  when necessary -Replete potassium -Check basic metabolic panel in morning -Urine for strep and Legionella -Blood cultures X 2 -Sputum cultures and Gram stain  Continue home medications.  Code Status: Full Family Communication: Updated Disposition Plan/Anticipated LOS: Guarded  Time spent: 50 minutes  Lars Mage, MD  [facility] Team 5  If 7PM-7AM, please contact night-coverage at [email or web address], password [facility] 04/15/2013, 9:36 PM

## 2013-04-15 NOTE — ED Notes (Signed)
Pt arrives via EMS from urgent care facility. Pt has had persistant cough x2 weeks and fever. Room air sats 90%, 95% on 3L

## 2013-04-15 NOTE — ED Notes (Signed)
[id number]> Expected date:<BR> Expected time:<BR> Means of arrival:<BR> Comments:<BR> EMS/79 yo female from PCP&#39;s office-SOB/rhonchi on exam

## 2013-04-16 DIAGNOSIS — G47419 Narcolepsy without cataplexy: Secondary | ICD-10-CM

## 2013-04-16 DIAGNOSIS — E876 Hypokalemia: Secondary | ICD-10-CM

## 2013-04-16 LAB — LEGIONELLA ANTIGEN, URINE: Legionella Antigen, Urine: NEGATIVE

## 2013-04-16 LAB — BASIC METABOLIC PANEL
BUN: 12 mg/dL (ref 6–23)
Chloride: 104 mEq/L (ref 96–112)
GFR calc non Af Amer: 87 mL/min — ABNORMAL LOW (ref >90)
Glucose, Bld: 215 mg/dL — ABNORMAL HIGH (ref 70–99)
Potassium: 4.6 mEq/L (ref 3.5–5.1)
Sodium: 140 mEq/L (ref 135–145)

## 2013-04-16 MED ORDER — IPRATROPIUM BROMIDE 0.02 % IN SOLN
0.5000 mg | Freq: Four times a day (QID) | RESPIRATORY_TRACT | Status: DC
  Administered 2013-04-16 (×2): 0.5 mg via RESPIRATORY_TRACT
  Filled 2013-04-16 (×2): qty 2.5

## 2013-04-16 MED ORDER — IPRATROPIUM BROMIDE 0.02 % IN SOLN
0.5000 mg | Freq: Three times a day (TID) | RESPIRATORY_TRACT | Status: DC
  Administered 2013-04-17: 0.5 mg via RESPIRATORY_TRACT
  Filled 2013-04-16 (×2): qty 2.5

## 2013-04-16 MED ORDER — VITAMINS A & D EX OINT
TOPICAL_OINTMENT | CUTANEOUS | Status: AC
  Filled 2013-04-16: qty 5

## 2013-04-16 MED ORDER — GUAIFENESIN-DM 100-10 MG/5ML PO SYRP
5.0000 mL | ORAL_SOLUTION | ORAL | Status: DC | PRN
  Administered 2013-04-16: 5 mL via ORAL
  Filled 2013-04-16: qty 10

## 2013-04-16 MED ORDER — SODIUM CHLORIDE 0.9 % IV SOLN
INTRAVENOUS | Status: AC
  Administered 2013-04-16: 09:00:00 via INTRAVENOUS

## 2013-04-16 MED ORDER — GABAPENTIN 100 MG PO CAPS
100.0000 mg | ORAL_CAPSULE | Freq: Three times a day (TID) | ORAL | Status: DC
  Administered 2013-04-16 – 2013-04-17 (×4): 100 mg via ORAL
  Filled 2013-04-16 (×6): qty 1

## 2013-04-16 MED ORDER — ALBUTEROL SULFATE (5 MG/ML) 0.5% IN NEBU
2.5000 mg | INHALATION_SOLUTION | Freq: Four times a day (QID) | RESPIRATORY_TRACT | Status: DC
  Administered 2013-04-16 (×2): 2.5 mg via RESPIRATORY_TRACT
  Filled 2013-04-16 (×2): qty 0.5

## 2013-04-16 MED ORDER — OXYCODONE HCL 5 MG PO TABS
30.0000 mg | ORAL_TABLET | Freq: Two times a day (BID) | ORAL | Status: DC | PRN
  Administered 2013-04-16 – 2013-04-17 (×2): 30 mg via ORAL
  Filled 2013-04-16 (×3): qty 6

## 2013-04-16 MED ORDER — ALBUTEROL SULFATE (5 MG/ML) 0.5% IN NEBU
2.5000 mg | INHALATION_SOLUTION | Freq: Three times a day (TID) | RESPIRATORY_TRACT | Status: DC
  Administered 2013-04-17: 2.5 mg via RESPIRATORY_TRACT
  Filled 2013-04-16 (×2): qty 0.5

## 2013-04-16 MED ORDER — MENTHOL 3 MG MT LOZG
1.0000 | LOZENGE | OROMUCOSAL | Status: DC | PRN
  Filled 2013-04-16 (×2): qty 9

## 2013-04-16 NOTE — Progress Notes (Signed)
[facility]                                                                                Patient Demographics  Angelica Bell, is a 79 y.o. female, DOB - 1932-08-25, ZOX:[id number], WJX:[id number]  Admit date - 04/15/2013  Admitting Physician Lars Mage, MD  Outpatient Primary MD for the patient is Jeani Hawking, MD  LOS - 1   Chief Complaint  Patient presents with  . Cough        Assessment & Plan    1.  Community acquired pneumonia - much better on empiric antibiotics, taper down oxygen and increase activity, when necessary nebulizer treatments, patient does give history of choking on food once in a while, aspiration precautions and feeding assistance, speech evaluation.   2. Hypertension dyslipidemia. No acute issues continue home medications unchanged. Outpatient followup with PCP post discharge.    3. Peripheral neuropathy with bilateral lower extremity pain right leg more than left. Added Neurontin, continue home narcotics on when necessary basis counseled to taper off gradually.   4. History of narcolepsy without cataplexy. Stable no acute issues.   5. Hypokalemia replaced and stable.     Code Status: Full  Family Communication:    Disposition Plan: Home   Procedures CXR   Consults  PT   DVT Prophylaxis  Lovenox    Lab Results  Component Value Date   PLT 291 04/15/2013    Medications  Scheduled Meds: . albuterol  2.5 mg Nebulization Q6H WA  . aspirin EC  81 mg Oral Daily  . azithromycin  500 mg Intravenous QHS  . cefTRIAXone (ROCEPHIN)  IV  1 g Intravenous QHS  . donepezil  5 mg Oral QHS  . enoxaparin (LOVENOX) injection  40 mg Subcutaneous QHS  . fentaNYL  100 mcg Transdermal Q48H  . fluorometholone  1 drop Both Eyes Daily  . gabapentin  100 mg Oral TID  . ipratropium  0.5 mg Nebulization Q6H WA  . potassium chloride  40 mEq Oral BID   Continuous Infusions: . sodium chloride 50 mL/hr at 04/16/13 0852   PRN  Meds:.ALPRAZolam, ondansetron (ZOFRAN) IV, ondansetron, oxyCODONE, zolpidem  Antibiotics     Anti-infectives   Start     Dose/Rate Route Frequency Ordered Stop   04/15/13 2315  cefTRIAXone (ROCEPHIN) 1 g in dextrose 5 % 50 mL IVPB     1 g 100 mL/hr over 30 Minutes Intravenous Daily at bedtime 04/15/13 2250     04/15/13 2315  azithromycin (ZITHROMAX) 500 mg in dextrose 5 % 250 mL IVPB     500 mg 250 mL/hr over 60 Minutes Intravenous Daily at bedtime 04/15/13 2250     04/15/13 1945  levofloxacin (LEVAQUIN) IVPB 750 mg     750 mg 100 mL/hr over 90 Minutes Intravenous  Once 04/15/13 1940 04/15/13 2154       Time Spent in minutes   40   Angelica Bell K M.D on 04/16/2013 at 9:27 AM  Between 7am to 7pm - Pager - [phone]  After 7pm go to [email or web address] - password [email or web address]  And look for the night coverage person covering for me after hours  [facility]  Office  [phone]    Subjective:   Angelica Bell today has, No headache, No chest pain, No abdominal pain - No Nausea, No new weakness tingling or numbness, No Cough - SOB. On it lower extremity pain which is burning in sensation present for many months to years.  Objective:   Filed Vitals:   04/15/13 2130 04/15/13 2315 04/16/13 0440 04/16/13 0800  BP: 149/59 135/54 117/53   Pulse: 86 98 97   Temp:  98 F (36.7 C) 98 F (36.7 C)   TempSrc:  Oral Oral   Resp: 28 19 19    Height:  5\' 2"  (1.575 m)    Weight:  53.9 kg (118 lb 13.3 oz)    SpO2: 93% 98% 98% 93%    Wt Readings from Last 3 Encounters:  04/15/13 53.9 kg (118 lb 13.3 oz)  08/28/12 57.607 kg (127 lb)  08/11/12 58.514 kg (129 lb)     Intake/Output Summary (Last 24 hours) at 04/16/13 0927 Last data filed at 04/16/13 0751  Gross per 24 hour  Intake   1300 ml  Output      0 ml  Net   1300 ml    Exam Awake Alert, Oriented X 3, No new F.N deficits, Normal affect [name].AT,PERRAL Supple Neck,No JVD, No cervical lymphadenopathy appriciated.   Symmetrical Chest wall movement, Good air movement bilaterally, CTAB RRR,No Gallops,Rubs or new Murmurs, No Parasternal Heave +ve B.Sounds, Abd Soft, Non tender, No organomegaly appriciated, No rebound - guarding or rigidity. No Cyanosis, Clubbing or edema, No new Rash or bruise     Data Review   Micro Results No results found for this or any previous visit (from the past 240 hour(s)).  Radiology Reports Dg Chest 2 View  04/15/2013  *RADIOLOGY REPORT*  Clinical Data: Cough, weakness, shortness of breath.  CHEST - 2 VIEW  Comparison: 02/23/2007  Findings: There is extensive infiltrate throughout the left lung. Right lung is clear.  Heart size and vascularity are normal. Fairly severe thoracolumbar scoliosis.  IMPRESSION: Extensive infiltrate in the left lung consistent with pneumonia.   Original Report Authenticated By: Francene Boyers, M.D.     CBC  Recent Labs Lab 04/15/13 2010  WBC 16.3*  HGB 11.9*  HCT 35.0*  PLT 291  MCV 83.9  MCH 28.5  MCHC 34.0  RDW 13.1  LYMPHSABS 2.2  MONOABS 1.5*  EOSABS 0.2  BASOSABS 0.1    Chemistries   Recent Labs Lab 04/15/13 2010 04/16/13 0432  NA 135 140  K 2.9* 4.6  CL 97 104  CO2 29 29  GLUCOSE 122* 215*  BUN 13 12  CREATININE 0.59 0.54  CALCIUM 9.1 9.2   ------------------------------------------------------------------------------------------------------------------ estimated creatinine clearance is 45.1 ml/min (by C-G formula based on Cr of 0.54). ------------------------------------------------------------------------------------------------------------------ No results found for this basename: HGBA1C,  in the last 72 hours ------------------------------------------------------------------------------------------------------------------ No results found for this basename: CHOL, HDL, LDLCALC, TRIG, CHOLHDL, LDLDIRECT,  in the last 72  hours ------------------------------------------------------------------------------------------------------------------ No results found for this basename: TSH, T4TOTAL, FREET3, T3FREE, THYROIDAB,  in the last 72 hours ------------------------------------------------------------------------------------------------------------------ No results found for this basename: VITAMINB12, FOLATE, FERRITIN, TIBC, IRON, RETICCTPCT,  in the last 72 hours  Coagulation profile No results found for this basename: INR, PROTIME,  in the last 168 hours  No results found for this basename: DDIMER,  in the last 72 hours  Cardiac Enzymes No results found for this basename: CK, CKMB, TROPONINI, MYOGLOBIN,  in the last 168 hours ------------------------------------------------------------------------------------------------------------------ No components found with  this basename: POCBNP,

## 2013-04-16 NOTE — Progress Notes (Signed)
Utilization review completed.  

## 2013-04-16 NOTE — Evaluation (Signed)
Clinical/Bedside Swallow Evaluation Patient Details  Name: Angelica Bell MRN: [id number] Date of Birth: 12/17/1932  Today's Date: 04/16/2013 Time: 4098-1191 SLP Time Calculation (min): 25 min  Past Medical History:  Past Medical History  Diagnosis Date  . Osteoarthritis   . Osteoporosis   . Pneumonia   . Chronic pain disorder     with narcotic management  . Hyperlipidemia   . Hypertension   . Chronic back pain     with degenerative joint disease   Past Surgical History:  Past Surgical History  Procedure Laterality Date  . Appendectomy    . Abdominal hysterectomy     HPI:  Patient is a 79 year old female with past medical history most significant for osteoarthritis, anxiety and depression, narcolepsy without cataplexy and hyperlipidemia who comes in with cough and shortness of breath and found to have community-acquired pneumonia in left lower lobe   Assessment / Plan / Recommendation Clinical Impression  Pt demonstrates adequate oral and oropharyngeal function. There is no evidence of aspiration. Pt does report a history of strictures and stretching. She occasionally had to regurgitate food due to globus. Today pt experienced globus when swallow a piece of ice. SLP offered strategies and potential f/u opportunities. Pt reports she does not want any procedure again and does not feel the problem is severe at this time. Reccomend Regular diet and thin liquids with basic esophageal precautions. No SLP f/u needed.     Aspiration Risk  Mild    Diet Recommendation Regular;Thin liquid   Liquid Administration via: Cup;Straw Medication Administration: Whole meds with liquid Supervision: Patient able to self feed Compensations: Slow rate;Small sips/bites;Follow solids with liquid Postural Changes and/or Swallow Maneuvers: Seated upright 90 degrees;Upright 30-60 min after meal    Other  Recommendations Oral Care Recommendations: Oral care BID   Follow Up Recommendations        Frequency and Duration        Pertinent Vitals/Pain NA    SLP Swallow Goals     Swallow Study Prior Functional Status  Type of Home: House Lives With: Alone;Son Vocation: Retired    Radio producer HPI: Patient is a 79 year old female with past medical history most significant for osteoarthritis, anxiety and depression, narcolepsy without cataplexy and hyperlipidemia who comes in with cough and shortness of breath and found to have community-acquired pneumonia in left lower lobe Type of Study: Bedside swallow evaluation Diet Prior to this Study: Regular;Thin liquids Temperature Spikes Noted: No Respiratory Status: Room air History of Recent Intubation: No Behavior/Cognition: Alert;Cooperative;Pleasant mood Oral Cavity - Dentition: Adequate natural dentition Self-Feeding Abilities: Able to feed self Patient Positioning: Upright in bed Baseline Vocal Quality: Clear Volitional Cough: Strong Volitional Swallow: Able to elicit    Oral/Motor/Sensory Function Overall Oral Motor/Sensory Function: Appears within functional limits for tasks assessed   Ice Chips     Thin Liquid Thin Liquid: Within functional limits    Nectar Thick Nectar Thick Liquid: Not tested   Honey Thick Honey Thick Liquid: Not tested   Puree Puree: Within functional limits   Solid   GO    Solid: Within functional limits      [facility], MA CCC-SLP [phone]  Claudine Mouton 04/16/2013,3:51 PM

## 2013-04-16 NOTE — Evaluation (Signed)
Physical Therapy Evaluation Patient Details Name: Angelica Bell MRN: [id number] DOB: 08-04-1932 Today's Date: 04/16/2013 Time: 1310-1350 PT Time Calculation (min): 40 min  PT Assessment / Plan / Recommendation Clinical Impression  Pt presents with CAP with chronic back pain from OA.  Tolerated OOB and ambulation in hallway and to/from restroom.  Note that she is very unsteady without AD with extremely forward flexed posture, however showed marked improvement with RW.  Ambulated on 1 L O2 with SaO2 upon returning to room at 87-88%.  Provided cues for pursed lip breathing and she was able to increase sats to 94%.  Pt will benefit from skilled PT in acute venue to address deficits.  PT recommends HHPT for follow up (pt declines SNF) with [facility] aide if eligible.      PT Assessment  Patient needs continued PT services    Follow Up Recommendations  Home health PT;Supervision for mobility/OOB [facility] aide)    Does the patient have the potential to tolerate intense rehabilitation      Barriers to Discharge Decreased caregiver support      Equipment Recommendations  None recommended by PT    Recommendations for Other Services OT consult   Frequency Min 3X/week    Precautions / Restrictions Precautions Precautions: Fall Precaution Comments: Monitor O2 Restrictions Weight Bearing Restrictions: No   Pertinent Vitals/Pain Pt with c/o pain in throat.  RN notified.       Mobility  Bed Mobility Bed Mobility: Supine to Sit;Sit to Supine Supine to Sit: HOB elevated;5: Supervision Sit to Supine: 5: Supervision;HOB elevated Details for Bed Mobility Assistance: Supervision for safety with max cues for safety as she is impulsive with most mobility.  Transfers Transfers: Sit to Stand;Stand to Sit Sit to Stand: 4: Min guard;With upper extremity assist;From bed;From toilet Stand to Sit: 4: Min guard;With upper extremity assist;To bed;To toilet Details for Transfer Assistance: Min/guard for  safety/steadying with cues for hand placement and safety.  Again pt is very impulsive to stand and get to restroom without therapist being ready.  Ambulation/Gait Ambulation/Gait Assistance: 4: Min assist Ambulation Distance (Feet): 160 Feet Assistive device: Rolling walker Ambulation/Gait Assistance Details: Ambulated to/from restroom x2, first time without use of AD and noted that she was very unsteady with extremely forward flexed posture.  She states that she has always walked that way due to her chronic back pain/OA.  Provided pt with RW for ambulation in hallway with much improvement in stability and posture.  she did require constant cues for maintaining upright posture and maintaining position inside of RW, however could correct with cues.  Gait Pattern: Decreased stride length;Step-through pattern;Trunk flexed Stairs: No Wheelchair Mobility Wheelchair Mobility: No    Exercises     PT Diagnosis: Difficulty walking;Generalized weakness;Acute pain  PT Problem List: Decreased strength;Decreased activity tolerance;Decreased balance;Decreased mobility;Decreased coordination;Decreased knowledge of use of DME;Decreased safety awareness;Decreased knowledge of precautions;Cardiopulmonary status limiting activity;Pain PT Treatment Interventions: DME instruction;Gait training;Stair training;Functional mobility training;Therapeutic activities;Balance training;Therapeutic exercise;Patient/family education   PT Goals Acute Rehab PT Goals PT Goal Formulation: With patient Time For Goal Achievement: 04/23/13 Potential to Achieve Goals: Good Pt will go Sit to Supine/Side: with modified independence PT Goal: Sit to Supine/Side - Progress: Goal set today Pt will go Sit to Stand: with modified independence PT Goal: Sit to Stand - Progress: Goal set today Pt will Ambulate: 51 - 150 feet;with modified independence;with least restrictive assistive device PT Goal: Ambulate - Progress: Goal set today Pt  will Go Up / Down Stairs:  1-2 stairs;with supervision;with least restrictive assistive device PT Goal: Up/Down Stairs - Progress: Goal set today Pt will Perform Home Exercise Program: with supervision, verbal cues required/provided PT Goal: Perform Home Exercise Program - Progress: Goal set today  Visit Information  Last PT Received On: 04/16/13 Assistance Needed: +1    Subjective Data  Subjective: I just really don't feel like walking, but I know I need to.  Patient Stated Goal: to return home, but not tomorrow.    Prior Functioning  Home Living Lives With: Alone;Son Type of Home: House Home Access: Stairs to enter Secretary/administrator of Steps: 1 Entrance Stairs-Rails: None Home Layout: One level (son lives on upper level) Bathroom Shower/Tub: Walk-in Soil scientist Toilet: Handicapped height Home Adaptive Equipment: Straight cane;Walker - rolling Additional Comments: Son works during the day, but is there at night.  Prior Function Level of Independence: Independent with assistive device(s) (used cane outside of house) Able to Take Stairs?: Yes Driving: No Vocation: Retired Musician: No difficulties    Copywriter, advertising Arousal/Alertness: Awake/alert Behavior During Therapy: WFL for tasks assessed/performed (Pt initially agitated, however improved during session) Overall Cognitive Status: Within Functional Limits for tasks assessed    Extremity/Trunk Assessment Right Lower Extremity Assessment RLE ROM/Strength/Tone: WFL for tasks assessed RLE Sensation: WFL - Light Touch Left Lower Extremity Assessment LLE ROM/Strength/Tone: WFL for tasks assessed LLE Sensation: WFL - Light Touch   Balance    End of Session PT - End of Session Equipment Utilized During Treatment: Gait belt;Oxygen Activity Tolerance: Patient limited by fatigue Patient left: in bed;with call bell/phone within reach;with bed alarm set Nurse Communication: Mobility  status;Patient requests pain meds  GP     Vista Deck 04/16/2013, 2:11 PM

## 2013-04-17 DIAGNOSIS — F341 Dysthymic disorder: Secondary | ICD-10-CM

## 2013-04-17 DIAGNOSIS — G5790 Unspecified mononeuropathy of unspecified lower limb: Secondary | ICD-10-CM

## 2013-04-17 MED ORDER — CEPASTAT 14.5 MG MT LOZG: 1.0000 | LOZENGE | OROMUCOSAL | Status: DC | PRN

## 2013-04-17 MED ORDER — AZITHROMYCIN 250 MG PO TABS: 250.0000 mg | ORAL_TABLET | Freq: Every day | ORAL | Status: DC

## 2013-04-17 MED ORDER — GABAPENTIN 100 MG PO CAPS: 100.0000 mg | ORAL_CAPSULE | Freq: Three times a day (TID) | ORAL | Status: DC

## 2013-04-17 MED ORDER — HYDROCODONE-ACETAMINOPHEN 5-325 MG PO TABS
1.0000 | ORAL_TABLET | Freq: Once | ORAL | Status: AC
  Administered 2013-04-17: 1 via ORAL
  Filled 2013-04-17: qty 1

## 2013-04-17 NOTE — Discharge Summary (Signed)
[facility]                                                                                   Angelica Bell, is a 79 y.o. female  DOB 1932-02-06  MRN [id number].  Admission date:  04/15/2013  Discharge Date:  04/17/2013  Primary MD  Jeani Hawking, MD  Admitting Physician  Lars Mage, MD  Admission Diagnosis  Hypokalemia [276.8] Community acquired pneumonia [486] Laryngitis [464.00]  Discharge Diagnosis     Principal Problem:   Community acquired pneumonia Active Problems:   HYPERLIPIDEMIA   ANXIETY DEPRESSION   Narcolepsy without cataplexy   PERIPHERAL NEUROPATHY, LOWER EXTREMITY, RIGHT   OSTEOARTHRITIS   Hypokalemia    Past Medical History  Diagnosis Date  . Osteoarthritis   . Osteoporosis   . Pneumonia   . Chronic pain disorder     with narcotic management  . Hyperlipidemia   . Hypertension   . Chronic back pain     with degenerative joint disease    Past Surgical History  Procedure Laterality Date  . Appendectomy    . Abdominal hysterectomy       Recommendations for primary care physician for things to follow:   Repeat CBC, BMP and 2 view chest x-ray in a week   Discharge Diagnoses:   Principal Problem:   Community acquired pneumonia Active Problems:   HYPERLIPIDEMIA   ANXIETY DEPRESSION   Narcolepsy without cataplexy   PERIPHERAL NEUROPATHY, LOWER EXTREMITY, RIGHT   OSTEOARTHRITIS   Hypokalemia    Discharge Condition:Stable   Diet recommendation: See Discharge Instructions below   Consults      History of present illness and  Hospital Course:     Kindly see H&P for history of present illness and admission details, please review complete Labs, Consult reports and Test reports for all details in brief Angelica Bell, is a 79 y.o. female,  with history of neuropathic lower estimate the pain right more than left, dyslipidemia, narcolepsy without cataplexy was admitted to the hospital with cough, subjective fevers and some  shortness of breath due to committee acquired pneumonia, patient was treated with empiric antibiotics with good results, she is now symptom-free and off of oxygen for the last 24 hours, physical exam is stable, admitted in the hallway without any oxygen need, will be discharged home on oral azithromycin 5 more days, she will get home PT as she has some baseline deconditioning. We'll request primary care physician to kindly repeat CBC, BMP and a 2 view chest x-ray in a week's time to document clarity of pneumonia and clinical improvement.   She has peripheral neuropathy pain in lower extremities right more than left, I have added Neurontin, she did feel better yesterday after first few doses. She will get prescriptions for the pain same.     Her underlying issues hypertension, dyslipidemia, narcolepsy are all stable outpatient followup with primary care physician post discharge.    Today   Subjective:   Angelica Bell today has no headache,no chest abdominal pain,no new weakness tingling or numbness, feels much better wants to go home today.    Objective:   Blood pressure 138/67, pulse 86,  temperature 98.3 F (36.8 C), temperature source Oral, resp. rate 19, height 5\' 2"  (1.575 m), weight 53.9 kg (118 lb 13.3 oz), SpO2 97.00%.   Intake/Output Summary (Last 24 hours) at 04/17/13 0954 Last data filed at 04/17/13 0700  Gross per 24 hour  Intake 1056.67 ml  Output      0 ml  Net 1056.67 ml    Exam Awake Alert, Oriented *3, No new F.N deficits, Normal affect [place].AT,PERRAL Supple Neck,No JVD, No cervical lymphadenopathy appriciated.  Symmetrical Chest wall movement, Good air movement bilaterally, CTAB RRR,No Gallops,Rubs or new Murmurs, No Parasternal Heave +ve B.Sounds, Abd Soft, Non tender, No organomegaly appriciated, No rebound -guarding or rigidity. No Cyanosis, Clubbing or edema, No new Rash or bruise  Data Review   Major procedures and Radiology Reports - PLEASE review  detailed and final reports for all details in brief -       Dg Chest 2 View  04/15/2013  *RADIOLOGY REPORT*  Clinical Data: Cough, weakness, shortness of breath.  CHEST - 2 VIEW  Comparison: 02/23/2007  Findings: There is extensive infiltrate throughout the left lung. Right lung is clear.  Heart size and vascularity are normal. Fairly severe thoracolumbar scoliosis.  IMPRESSION: Extensive infiltrate in the left lung consistent with pneumonia.   Original Report Authenticated By: Francene Boyers, M.D.     Micro Results      No results found for this or any previous visit (from the past 240 hour(s)).   CBC w Diff: Lab Results  Component Value Date   WBC 16.3* 04/15/2013   HGB 11.9* 04/15/2013   HCT 35.0* 04/15/2013   PLT 291 04/15/2013   LYMPHOPCT 14 04/15/2013   MONOPCT 9 04/15/2013   EOSPCT 1 04/15/2013   BASOPCT 0 04/15/2013    CMP: Lab Results  Component Value Date   NA 140 04/16/2013   K 4.6 04/16/2013   CL 104 04/16/2013   CO2 29 04/16/2013   BUN 12 04/16/2013   CREATININE 0.54 04/16/2013   PROT 6.7 02/18/2012   ALBUMIN 4.0 02/18/2012   BILITOT 0.4 02/18/2012   ALKPHOS 81 02/18/2012   AST 21 02/18/2012   ALT 18 02/18/2012  .   Discharge Instructions     Follow with Primary MD GREWAL,MICHELLE L, MD in 7 days   Get CBC, CMP, checked 7 days by Primary MD and again as instructed by your Primary MD. Get a 2 view Chest X ray done next visit.  Get Medicines reviewed and adjusted.  Please request your Prim.MD to go over all Hospital Tests and Procedure/Radiological results at the follow up, please get all Hospital records sent to your Prim MD by signing hospital release before you go home.  Activity: As tolerated with Full fall precautions use walker/cane & assistance as needed   Diet:  Heart Healthy  For Heart failure patients - Check your Weight same time everyday, if you gain over 2 pounds, or you develop in leg swelling, experience more shortness of breath or chest pain, call  your Primary MD immediately. Follow Cardiac Low Salt Diet and 1.8 lit/day fluid restriction.  Disposition Home   If you experience worsening of your admission symptoms, develop shortness of breath, life threatening emergency, suicidal or homicidal thoughts you must seek medical attention immediately by calling 911 or calling your MD immediately  if symptoms less severe.  You Must read complete instructions/literature along with all the possible adverse reactions/side effects for all the Medicines you take and that have been prescribed  to you. Take any new Medicines after you have completely understood and accpet all the possible adverse reactions/side effects.   Do not drive and provide baby sitting services if your were admitted for syncope or siezures until you have seen by Primary MD or a Neurologist and advised to do so again.  Do not drive when taking Pain medications.    Do not take more than prescribed Pain, Sleep and Anxiety Medications  Special Instructions: If you have smoked or chewed Tobacco  in the last 2 yrs please stop smoking, stop any regular Alcohol  and or any Recreational drug use.  Wear Seat belts while driving.    Follow-up Information   Follow up with GREWAL,MICHELLE L, MD. Schedule an appointment as soon as possible for a visit in 1 week.   Contact information:   [address] [place] [address] [phone]         Discharge Medications     Medication List    TAKE these medications       ALPRAZolam 0.25 MG tablet  Commonly known as:  XANAX  Take 0.25 mg by mouth 3 (three) times daily as needed for anxiety.     amphetamine-dextroamphetamine 20 MG 24 hr capsule  Commonly known as:  ADDERALL XR  Take 20 mg by mouth daily as needed (for days when pt is driving.).     aspirin EC 81 MG tablet  Take 81 mg by mouth daily.     azithromycin 250 MG tablet  Commonly known as:  ZITHROMAX  Take 1 tablet (250 mg total) by mouth daily.      donepezil 5 MG tablet  Commonly known as:  ARICEPT  Take 5 mg by mouth at bedtime.     DURAGESIC 100 MCG/HR  Generic drug:  fentaNYL  Place 1 patch onto the skin every other day. Dr. Ethelene Hal       fluorometholone 0.1 % ophthalmic suspension  Commonly known as:  FML  Place 1 drop into both eyes daily.     gabapentin 100 MG capsule  Commonly known as:  NEURONTIN  Take 1 capsule (100 mg total) by mouth 3 (three) times daily.     multivitamin with minerals Tabs  Take 1 tablet by mouth daily.     omega-3 acid ethyl esters 1 G capsule  Commonly known as:  LOVAZA  Take 1 g by mouth 2 (two) times daily.     oxycodone 30 MG immediate release tablet  Commonly known as:  ROXICODONE  Take 30 mg by mouth 2 (two) times daily as needed for pain.     phenol-menthol 14.5 MG lozenge  Take 1 lozenge by mouth every 2 (two) hours as needed for pain.     temazepam 30 MG capsule  Commonly known as:  RESTORIL  Take 30 mg by mouth at bedtime as needed for sleep.           Total Time in preparing paper work, data evaluation and todays exam - 35 minutes  Leroy Sea M.D on 04/17/2013 at 9:54 AM  [facility] Office  [phone]

## 2013-04-29 ENCOUNTER — Other Ambulatory Visit (HOSPITAL_COMMUNITY): Payer: Self-pay | Admitting: Obstetrics and Gynecology

## 2013-04-29 ENCOUNTER — Ambulatory Visit (HOSPITAL_COMMUNITY)
Admission: RE | Admit: 2013-04-29 | Discharge: 2013-04-29 | Disposition: A | Discharge status: Home / Self Care | Payer: Medicare Other | Source: Ambulatory Visit | Attending: Obstetrics and Gynecology | Admitting: Obstetrics and Gynecology

## 2013-04-29 DIAGNOSIS — Z09 Encounter for follow-up examination after completed treatment for conditions other than malignant neoplasm: Secondary | ICD-10-CM | POA: Insufficient documentation

## 2013-04-29 DIAGNOSIS — R05 Cough: Secondary | ICD-10-CM | POA: Insufficient documentation

## 2013-04-29 DIAGNOSIS — J189 Pneumonia, unspecified organism: Secondary | ICD-10-CM | POA: Insufficient documentation

## 2013-04-29 DIAGNOSIS — R059 Cough, unspecified: Secondary | ICD-10-CM | POA: Insufficient documentation

## 2013-04-29 DIAGNOSIS — R5381 Other malaise: Secondary | ICD-10-CM | POA: Insufficient documentation

## 2013-04-29 DIAGNOSIS — R0989 Other specified symptoms and signs involving the circulatory and respiratory systems: Secondary | ICD-10-CM | POA: Insufficient documentation

## 2013-05-21 ENCOUNTER — Ambulatory Visit (HOSPITAL_COMMUNITY)
Admission: RE | Admit: 2013-05-21 | Discharge: 2013-05-21 | Disposition: A | Discharge status: Home / Self Care | Payer: Medicare Other | Source: Ambulatory Visit | Attending: Obstetrics and Gynecology | Admitting: Obstetrics and Gynecology

## 2013-05-21 ENCOUNTER — Other Ambulatory Visit (HOSPITAL_COMMUNITY): Payer: Self-pay | Admitting: Obstetrics and Gynecology

## 2013-05-21 DIAGNOSIS — R5381 Other malaise: Secondary | ICD-10-CM | POA: Insufficient documentation

## 2013-05-21 DIAGNOSIS — R5383 Other fatigue: Secondary | ICD-10-CM | POA: Insufficient documentation

## 2013-05-21 DIAGNOSIS — K449 Diaphragmatic hernia without obstruction or gangrene: Secondary | ICD-10-CM | POA: Insufficient documentation

## 2013-05-21 DIAGNOSIS — J984 Other disorders of lung: Secondary | ICD-10-CM | POA: Insufficient documentation

## 2013-05-21 DIAGNOSIS — J189 Pneumonia, unspecified organism: Secondary | ICD-10-CM

## 2013-05-21 DIAGNOSIS — M4120 Other idiopathic scoliosis, site unspecified: Secondary | ICD-10-CM | POA: Insufficient documentation

## 2013-05-21 DIAGNOSIS — R091 Pleurisy: Secondary | ICD-10-CM | POA: Insufficient documentation

## 2013-07-26 ENCOUNTER — Other Ambulatory Visit (HOSPITAL_COMMUNITY): Payer: Self-pay | Admitting: Physical Medicine and Rehabilitation

## 2013-07-26 DIAGNOSIS — IMO0002 Reserved for concepts with insufficient information to code with codable children: Secondary | ICD-10-CM

## 2013-07-26 DIAGNOSIS — M549 Dorsalgia, unspecified: Secondary | ICD-10-CM

## 2013-07-28 ENCOUNTER — Ambulatory Visit (HOSPITAL_COMMUNITY): Admission: RE | Admit: 2013-07-28 | Payer: Medicare Other | Source: Ambulatory Visit

## 2013-08-17 ENCOUNTER — Telehealth (HOSPITAL_COMMUNITY): Payer: Self-pay | Admitting: Interventional Radiology

## 2013-08-17 NOTE — Telephone Encounter (Signed)
Called pt again left VM for her to call if she wants to schedule consult concerning her sacral insufficiency fracture JMichaux

## 2013-08-24 ENCOUNTER — Other Ambulatory Visit: Payer: Self-pay | Admitting: Radiology

## 2013-08-24 ENCOUNTER — Ambulatory Visit (HOSPITAL_COMMUNITY): Admission: RE | Admit: 2013-08-24 | Payer: Medicare Other | Source: Ambulatory Visit

## 2013-08-27 ENCOUNTER — Ambulatory Visit (HOSPITAL_COMMUNITY)
Admission: RE | Admit: 2013-08-27 | Discharge: 2013-08-27 | Disposition: A | Discharge status: Home / Self Care | Payer: Medicare Other | Source: Ambulatory Visit | Attending: Physical Medicine and Rehabilitation | Admitting: Physical Medicine and Rehabilitation

## 2013-08-27 DIAGNOSIS — M549 Dorsalgia, unspecified: Secondary | ICD-10-CM

## 2013-08-27 DIAGNOSIS — IMO0002 Reserved for concepts with insufficient information to code with codable children: Secondary | ICD-10-CM

## 2013-12-27 IMAGING — CR DG CHEST 2V
2 series · 2 of 2 positions shown · non-contrast
Comparison: 04/15/2013 and 02/23/2007

CLINICAL DATA: Follow-up pneumonia.  Cough with congestion and 58

CHEST - 2 VIEW

[view not recorded (1 of 2)]
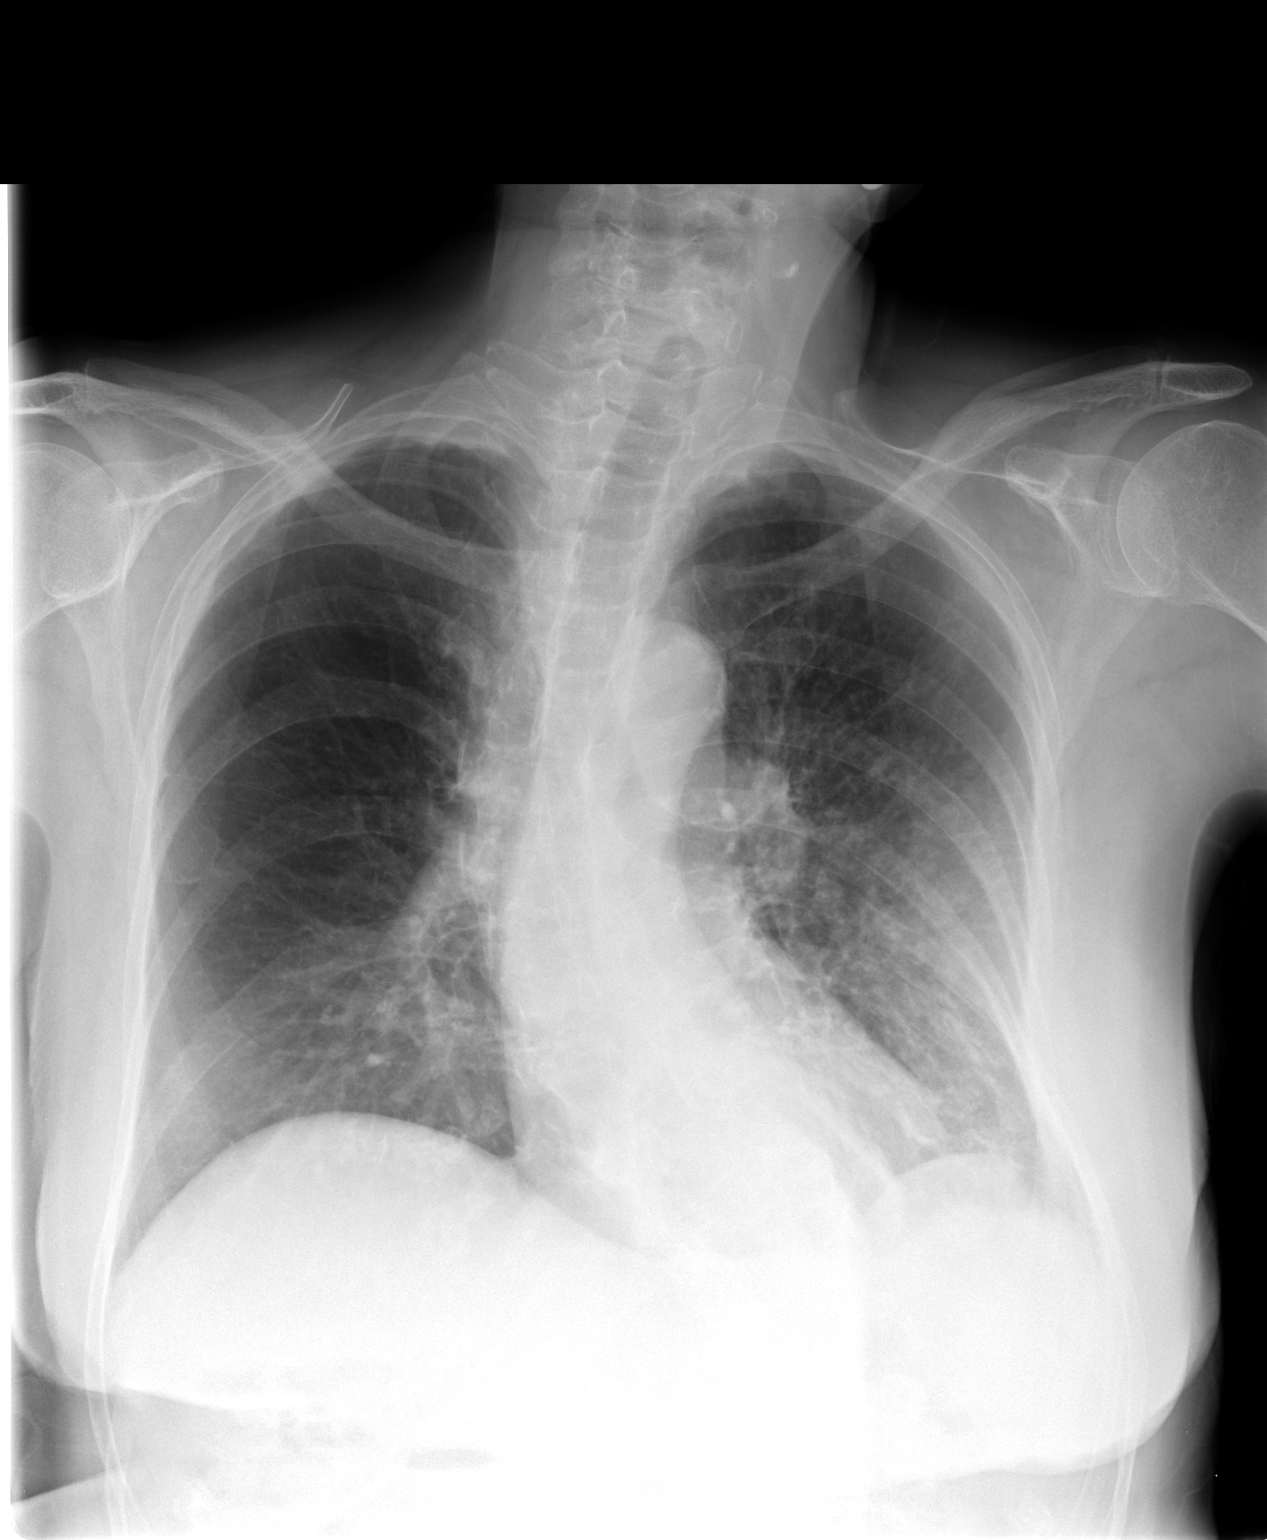

[view not recorded (2 of 2)]
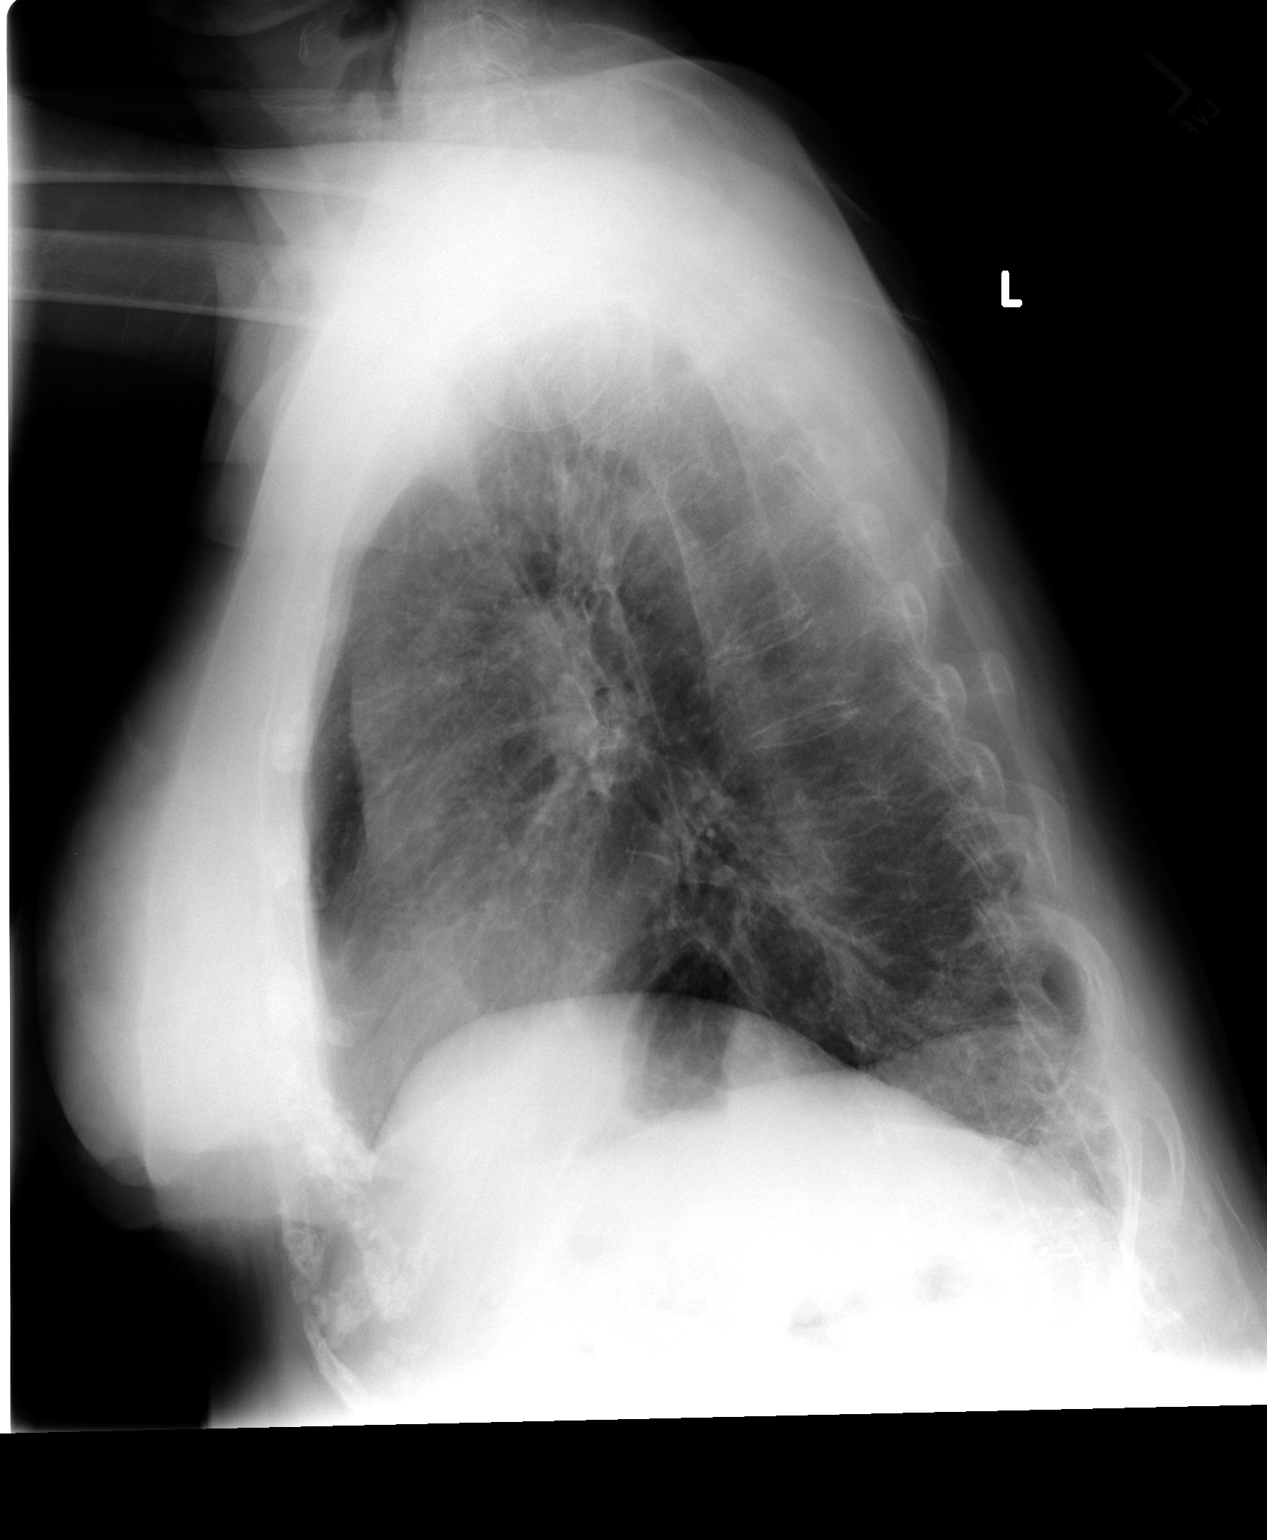

[2 of 2 positions shown; findings below may reference images not displayed]

FINDINGS: Scoliosis of the thoracic spine is identified.  Taking
this into consideration heart and mediastinal contours are stable.
Some prominence of the left hila is seen but remain unchanged since
4448 compatible with a benign process.  There has been substantial
improvement in the left sided infiltrate since the previous exam
with clearing of the upper lung zone and near complete clearing of
the lingula and lower lobe.  The right lung field remains clear.
No new infiltrates or signs of congestive failure seen.  No
definite pleural fluid is noted.
IMPRESSION: Marked interval improvement in left-sided pneumonia with residua as
noted above.  Follow-up to clearing would be recommended.

## 2014-01-18 ENCOUNTER — Emergency Department (HOSPITAL_COMMUNITY): Payer: Medicare Other

## 2014-01-18 ENCOUNTER — Emergency Department (HOSPITAL_COMMUNITY)
Admission: EM | Admit: 2014-01-18 | Discharge: 2014-01-18 | Disposition: A | Discharge status: Home / Self Care | Payer: Medicare Other | Attending: Emergency Medicine | Admitting: Emergency Medicine

## 2014-01-18 ENCOUNTER — Encounter (HOSPITAL_COMMUNITY): Payer: Self-pay | Admitting: Emergency Medicine

## 2014-01-18 DIAGNOSIS — R4182 Altered mental status, unspecified: Secondary | ICD-10-CM | POA: Insufficient documentation

## 2014-01-18 DIAGNOSIS — Z8701 Personal history of pneumonia (recurrent): Secondary | ICD-10-CM | POA: Insufficient documentation

## 2014-01-18 DIAGNOSIS — Y939 Activity, unspecified: Secondary | ICD-10-CM | POA: Insufficient documentation

## 2014-01-18 DIAGNOSIS — G894 Chronic pain syndrome: Secondary | ICD-10-CM | POA: Insufficient documentation

## 2014-01-18 DIAGNOSIS — Z79899 Other long term (current) drug therapy: Secondary | ICD-10-CM | POA: Insufficient documentation

## 2014-01-18 DIAGNOSIS — Z7982 Long term (current) use of aspirin: Secondary | ICD-10-CM | POA: Insufficient documentation

## 2014-01-18 DIAGNOSIS — Z8639 Personal history of other endocrine, nutritional and metabolic disease: Secondary | ICD-10-CM | POA: Insufficient documentation

## 2014-01-18 DIAGNOSIS — T4271XA Poisoning by unspecified antiepileptic and sedative-hypnotic drugs, accidental (unintentional), initial encounter: Secondary | ICD-10-CM | POA: Insufficient documentation

## 2014-01-18 DIAGNOSIS — Z87891 Personal history of nicotine dependence: Secondary | ICD-10-CM | POA: Insufficient documentation

## 2014-01-18 DIAGNOSIS — I10 Essential (primary) hypertension: Secondary | ICD-10-CM | POA: Insufficient documentation

## 2014-01-18 DIAGNOSIS — T402X1A Poisoning by other opioids, accidental (unintentional), initial encounter: Secondary | ICD-10-CM

## 2014-01-18 DIAGNOSIS — Y929 Unspecified place or not applicable: Secondary | ICD-10-CM | POA: Insufficient documentation

## 2014-01-18 DIAGNOSIS — M1990 Unspecified osteoarthritis, unspecified site: Secondary | ICD-10-CM | POA: Insufficient documentation

## 2014-01-18 DIAGNOSIS — Z862 Personal history of diseases of the blood and blood-forming organs and certain disorders involving the immune mechanism: Secondary | ICD-10-CM | POA: Insufficient documentation

## 2014-01-18 LAB — BASIC METABOLIC PANEL
BUN: 12 mg/dL (ref 6–23)
CHLORIDE: 102 meq/L (ref 96–112)
CO2: 28 mEq/L (ref 19–32)
Calcium: 9.6 mg/dL (ref 8.4–10.5)
Creatinine, Ser: 0.66 mg/dL (ref 0.50–1.10)
GFR, EST NON AFRICAN AMERICAN: 82 mL/min — AB (ref >90)
GLUCOSE: 114 mg/dL — AB (ref 70–99)
POTASSIUM: 3.9 meq/L (ref 3.7–5.3)
SODIUM: 142 meq/L (ref 137–147)

## 2014-01-18 LAB — CBC
HEMATOCRIT: 43.6 % (ref 36.0–46.0)
HEMOGLOBIN: 14.6 g/dL (ref 12.0–15.0)
MCH: 28.1 pg (ref 26.0–34.0)
MCHC: 33.5 g/dL (ref 30.0–36.0)
MCV: 83.8 fL (ref 78.0–100.0)
Platelets: 273 10*3/uL (ref 150–400)
RBC: 5.2 MIL/uL — ABNORMAL HIGH (ref 3.87–5.11)
RDW: 13.3 % (ref 11.5–15.5)
WBC: 11.5 10*3/uL — AB (ref 4.0–10.5)

## 2014-01-18 LAB — URINALYSIS, ROUTINE W REFLEX MICROSCOPIC
BILIRUBIN URINE: NEGATIVE
Glucose, UA: NEGATIVE mg/dL
Hgb urine dipstick: NEGATIVE
Ketones, ur: NEGATIVE mg/dL
Leukocytes, UA: NEGATIVE
NITRITE: NEGATIVE
PH: 7.5 (ref 5.0–8.0)
Protein, ur: NEGATIVE mg/dL
SPECIFIC GRAVITY, URINE: 1.008 (ref 1.005–1.030)
UROBILINOGEN UA: 0.2 mg/dL (ref 0.0–1.0)

## 2014-01-18 IMAGING — CR DG CHEST 2V
2 series · 2 of 2 positions shown · non-contrast
Comparison: 04/29/2013 and 04/15/2013.

CLINICAL DATA: Pneumonia, persistent fatigue.

CHEST - 2 VIEW

[view not recorded (1 of 2)]
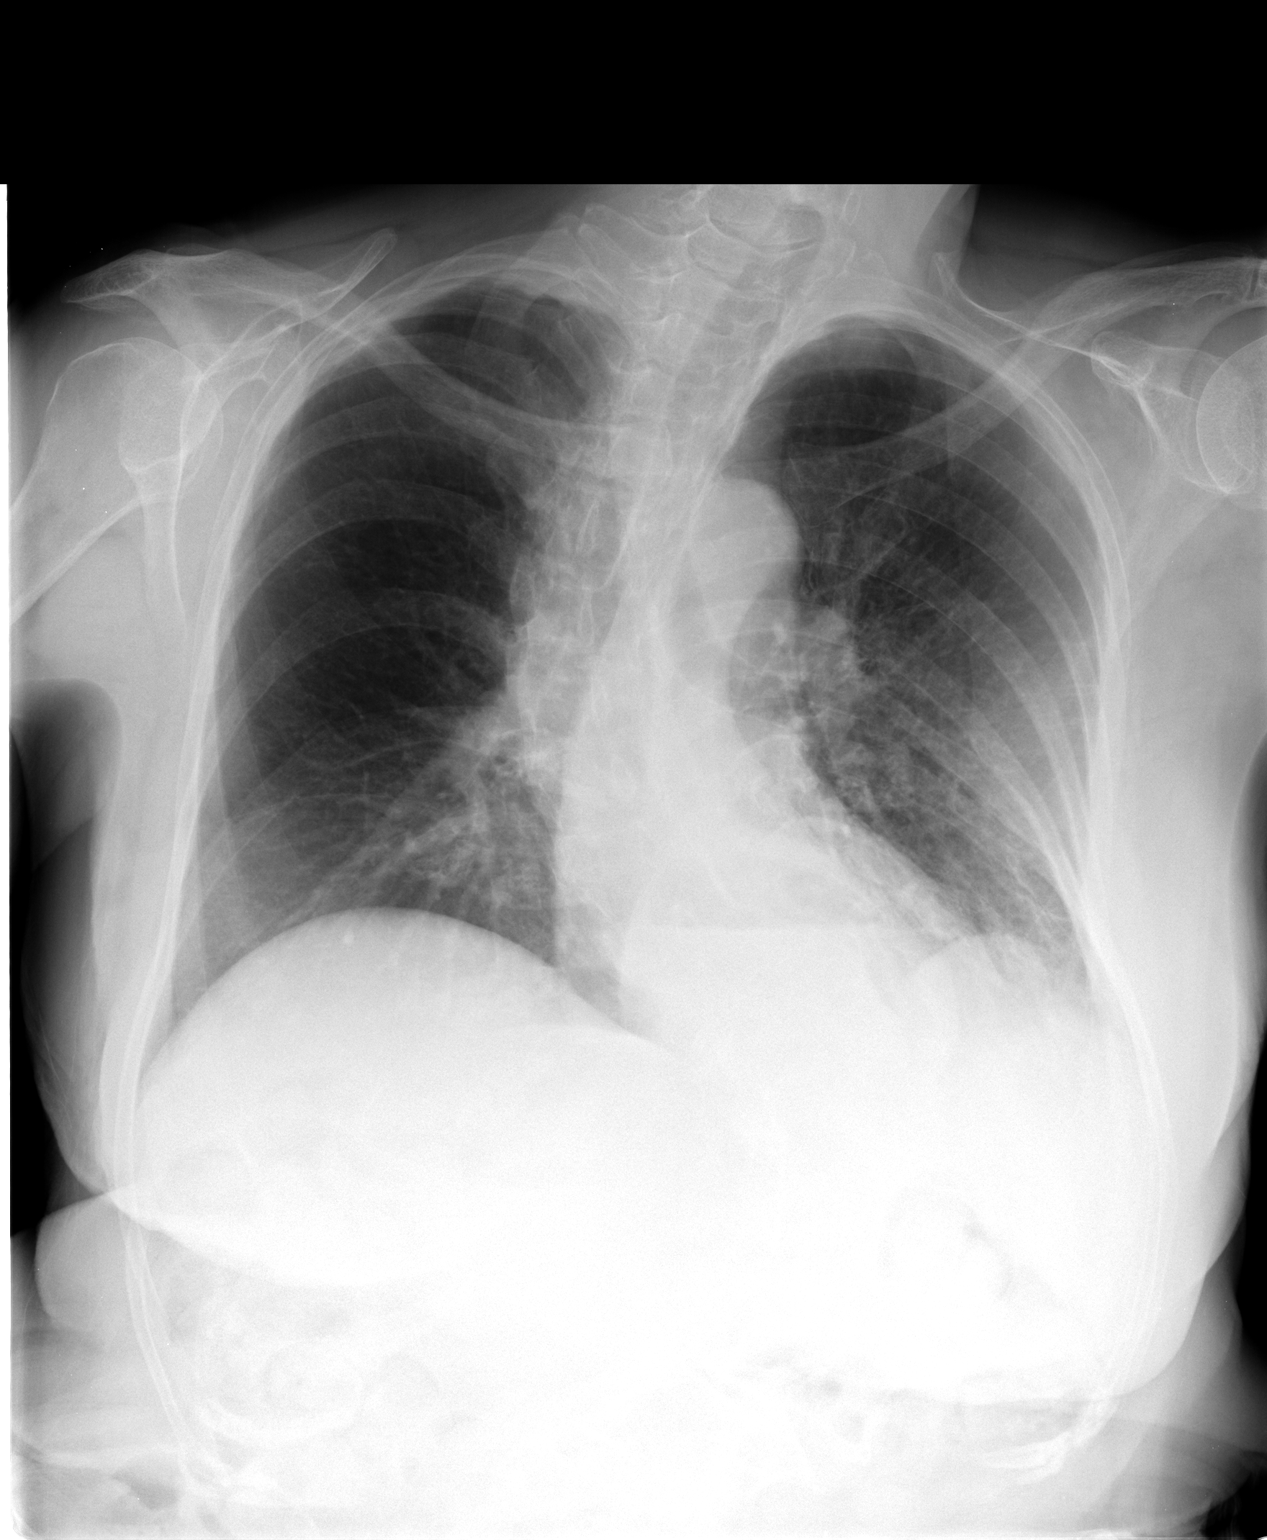

[view not recorded (2 of 2)]
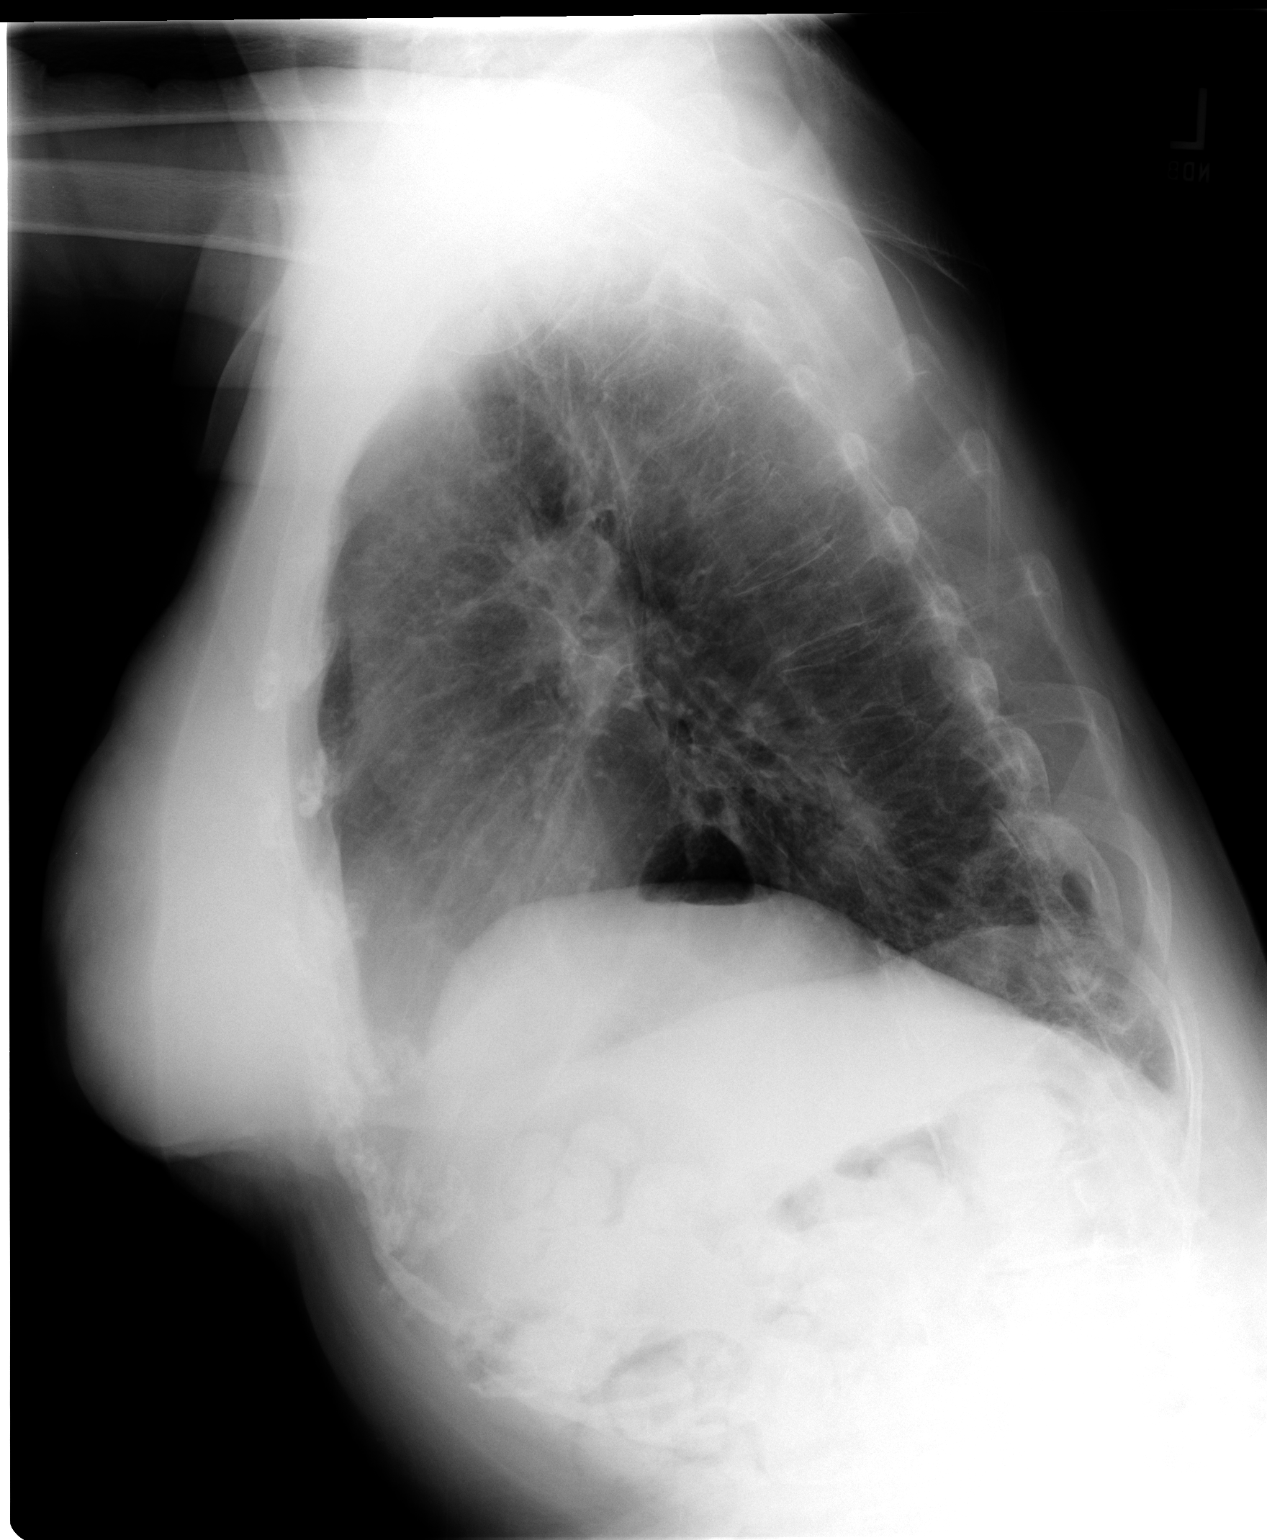

[2 of 2 positions shown; findings below may reference images not displayed]

FINDINGS: There is S-shaped scoliosis of the thoracic spine.  Heart
size stable.  Large hiatal hernia with an air-fluid level.
Biapical pleural thickening, right greater than left.  Vague
airspace opacity in the left midlung zone is less impressive on the
lateral view and may be due to overlying soft tissue and patient
rotation.  Scarring at the left lung base.  Right lung is clear.
No pleural fluid.
IMPRESSION: 1.  Probable mild residual left basilar scarring.
2.  Large hiatal hernia.

## 2014-01-18 MED ORDER — NALOXONE HCL 0.4 MG/ML IJ SOLN
0.4000 mg | Freq: Once | INTRAMUSCULAR | Status: AC
  Administered 2014-01-18: 0.4 mg via INTRAVENOUS
  Filled 2014-01-18: qty 1

## 2014-01-18 NOTE — Discharge Instructions (Signed)
Altered Mental Status Altered mental status most often refers to an abnormal change in your responsiveness and awareness. It can affect your speech, thought, mobility, memory, attention span, or alertness. It can range from slight confusion to complete unresponsiveness (coma). Altered mental status can be a sign of a serious underlying medical condition. Rapid evaluation and medical treatment is necessary for patients having an altered mental status. CAUSES   Low blood sugar (hypoglycemia) or diabetes.  Severe loss of body fluids (dehydration) or a body salt (electrolyte) imbalance.  A stroke or other neurologic problem, such as dementia or delirium.  A head injury or tumor.  A drug or alcohol overdose.  Exposure to toxins or poisons.  Depression, anxiety, and stress.  A low oxygen level (hypoxia).  An infection.  Blood loss.  Twitching or shaking (seizure).  Heart problems, such as heart attack or heart rhythm problems (arrhythmias).  A body temperature that is too low or too high (hypothermia or hyperthermia). DIAGNOSIS  A diagnosis is based on your history, symptoms, physical and neurologic examinations, and diagnostic tests. Diagnostic tests may include:  Measurement of your blood pressure, pulse, breathing, and oxygen levels (vital signs).  Blood tests.  Urine tests.  X-ray exams.  A computerized magnetic scan (magnetic resonance imaging, MRI).  A computerized X-ray scan (computed tomography, CT scan). TREATMENT  Treatment will depend on the cause. Treatment may include:  Management of an underlying medical or mental health condition.  Critical care or support in the hospital. [place]   Only take over-the-counter or prescription medicines for pain, discomfort, or fever as directed by your caregiver.  Manage underlying conditions as directed by your caregiver.  Eat a healthy, well-balanced diet to maintain strength.  Join a support group or  prevention program to cope with the condition or trauma that caused the altered mental status. Ask your caregiver to help choose a program that works for you.  Follow up with your caregiver for further examination, therapy, or testing as directed. SEEK MEDICAL CARE IF:   You feel unwell or have chills.  You or your family notice a change in your behavior or your alertness.  You have trouble following your caregiver's treatment plan.  You have questions or concerns. SEEK IMMEDIATE MEDICAL CARE IF:   You have a rapid heartbeat or have chest pain.  You have difficulty breathing.  You have a fever.  You have a headache with a stiff neck.  You cough up blood.  You have blood in your urine or stool.  You have severe agitation or confusion. MAKE SURE YOU:   Understand these instructions.  Will watch your condition.  Will get help right away if you are not doing well or get worse. Document Released: 06/05/2010 Document Revised: 03/09/2012 Document Reviewed: 06/05/2010 [facility] Patient Information 2014 [place].  Narcotic Overdose A narcotic overdose is the misuse or overuse of a narcotic drug. A narcotic overdose can make you pass out and stop breathing. If you are not treated right away, this can cause permanent brain damage or stop your heart. Medicine may be given to reverse the effects of an overdose. If so, this medicine may bring on withdrawal symptoms. The symptoms may be abdominal cramps, throwing up (vomiting), sweating, chills, and nervousness. Injecting narcotics can cause more problems than just an overdose. AIDS, hepatitis, and other very serious infections are transmitted by sharing needles and syringes. If you decide to quit using, there are medicines which can help you through the withdrawal  period. Trying to quit all at once on your own can be uncomfortable, but not life-threatening. Call your caregiver, Narcotics Anonymous, or any drug and alcohol treatment  program for further help.  Document Released: 01/23/2005 Document Revised: 03/09/2012 Document Reviewed: 11/17/2009 [facility] Patient Information 2014 [place], [place].

## 2014-01-18 NOTE — ED Notes (Signed)
Patient transported to CT 

## 2014-01-18 NOTE — ED Notes (Signed)
Bed: WA24 Expected date:  Expected time:  Means of arrival:  Comments: EMS 

## 2014-01-18 NOTE — ED Provider Notes (Signed)
CSN: [id number]     Arrival date & time 01/18/14  1750 History   First MD Initiated Contact with Patient 01/18/14 1810     Chief Complaint  Patient presents with  . Back Pain   (Consider location/radiation/quality/duration/timing/severity/associated sxs/prior Treatment) Patient is a 80 y.o. female presenting with altered mental status. The history is provided by the patient and a relative.  Altered Mental Status Presenting symptoms: confusion and lethargy   Severity:  Mild Most recent episode:  Today Episode history:  Single Timing:  Constant Progression:  Worsening Chronicity:  New Context: not taking medications as prescribed (taking extra opiates and benzos for her chronic back pain)   Associated symptoms: no fever     Past Medical History  Diagnosis Date  . Osteoarthritis   . Osteoporosis   . Pneumonia   . Chronic pain disorder     with narcotic management  . Hyperlipidemia   . Hypertension   . Chronic back pain     with degenerative joint disease   Past Surgical History  Procedure Laterality Date  . Appendectomy    . Abdominal hysterectomy     History reviewed. No pertinent family history. History  Substance Use Topics  . Smoking status: Former Smoker -- 13 years    Quit date: 04/15/1976  . Smokeless tobacco: Never Used  . Alcohol Use: No   OB History   Grav Para Term Preterm Abortions TAB SAB Ect Mult Living                 Review of Systems  Constitutional: Negative for fever.  Respiratory: Negative for cough and shortness of breath.   Psychiatric/Behavioral: Positive for confusion.  All other systems reviewed and are negative.    Allergies  Amoxicillin-pot clavulanate and Sulfonamide derivatives  Home Medications   Current Outpatient Rx  Name  Route  Sig  Dispense  Refill  . amphetamine-dextroamphetamine (ADDERALL XR) 30 MG 24 hr capsule   Oral   Take 30 mg by mouth daily.         Marland Kitchen aspirin EC 81 MG tablet   Oral   Take 81 mg by mouth  daily.         . fentaNYL (DURAGESIC) 100 MCG/HR   Transdermal   Place 1 patch onto the skin every other day. Dr. Nelva Bush          . gabapentin (NEURONTIN) 100 MG capsule   Oral   Take 1 capsule (100 mg total) by mouth 3 (three) times daily.   90 capsule   0   . LORazepam (ATIVAN) 2 MG tablet   Oral   Take 2 mg by mouth at bedtime as needed for anxiety.         Marland Kitchen oxycodone (ROXICODONE) 30 MG immediate release tablet   Oral   Take 30 mg by mouth 2 (two) times daily as needed for pain.         Marland Kitchen SILENOR 3 MG TABS   Oral   Take 3 mg by mouth at bedtime as needed (insomnia).          . temazepam (RESTORIL) 30 MG capsule   Oral   Take 30 mg by mouth at bedtime as needed for sleep.          Marland Kitchen ALPRAZolam (XANAX) 0.25 MG tablet   Oral   Take 0.25 mg by mouth 3 (three) times daily as needed for anxiety.          BP 114/78  Pulse 63  Temp(Src) 98.3 F (36.8 C) (Oral)  Resp 16  SpO2 98% Physical Exam  Nursing note and vitals reviewed. Constitutional: She is oriented to person, place, and time. She appears well-developed and well-nourished. No distress.  HENT:  Head: Normocephalic and atraumatic.  Eyes: EOM are normal. Pupils are equal, round, and reactive to light.  Neck: Normal range of motion. Neck supple.  Cardiovascular: Normal rate and regular rhythm.  Exam reveals no friction rub.   No murmur heard. Pulmonary/Chest: Effort normal and breath sounds normal. No respiratory distress. She has no wheezes. She has no rales.  Abdominal: Soft. She exhibits no distension. There is no tenderness. There is no rebound.  Musculoskeletal: Normal range of motion. She exhibits no edema.       Lumbar back: She exhibits tenderness (R lower lumbar, sacral). She exhibits no bony tenderness.  Neurological: She is alert and oriented to person, place, and time.  Sleepy, easily arousable  Skin: She is not diaphoretic.    ED Course  Procedures (including critical care  time) Labs Review Labs Reviewed  CBC - Abnormal; Notable for the following:    WBC 11.5 (*)    RBC 5.20 (*)    All other components within normal limits  BASIC METABOLIC PANEL - Abnormal; Notable for the following:    Glucose, Bld 114 (*)    GFR calc non Af Amer 82 (*)    All other components within normal limits  URINE RAPID DRUG SCREEN (HOSP PERFORMED)  URINALYSIS, ROUTINE W REFLEX MICROSCOPIC   Imaging Review Ct Head Wo Contrast  01/18/2014   CLINICAL DATA:  Multiple recent falls.  EXAM: CT HEAD WITHOUT CONTRAST  TECHNIQUE: Contiguous axial images were obtained from the base of the skull through the vertex without contrast.  COMPARISON:  04/10/2007  FINDINGS: No evidence for acute hemorrhage, mass lesion, midline shift, hydrocephalus or large infarct. There is mild atrophy. There may be mild sinus disease in left sphenoid sinus. Otherwise, the paranasal sinuses are clear. No acute bone abnormality.  IMPRESSION: No acute intracranial abnormality.   Electronically Signed   By: Markus Daft M.D.   On: 01/18/2014 19:54   Ct Lumbar Spine Wo Contrast  01/18/2014   CLINICAL DATA:  Multiple recent falls, with chronic back pain  EXAM: CT LUMBAR SPINE WITHOUT CONTRAST  TECHNIQUE: Multidetector CT imaging of the lumbar spine was performed without intravenous contrast administration. Multiplanar CT image reconstructions were also generated.  COMPARISON:  Prior CT from 05/06/2009  FINDINGS: For the purposes of this dictation, the lowest well-formed intervertebral disc spaces presumed to be the L5-S1 level.  Diffuse osteopenia is present, somewhat limiting evaluation for subtle nondisplaced fractures.  Severe levoscoliosis of the lumbar spine with apex at L2-3 is present. The vertebral body heights are preserved. No acute fracture or listhesis identified.  At L1-2, there is degenerative disc bulge with intervertebral disc space narrowing moderate bilateral facet hypertrophy. Prominent right-sided facet  arthrosis is present. There is moderate right foraminal stenosis without significant central canal narrowing. Prominent bridging osteophytes is seen on the right at this level.  At L2-3, there is degenerative disc bulge with intervertebral disc space narrowing and disc desiccation. Severe right with moderate left facet arthrosis is present. No definite canal stenosis. There is moderate right foraminal stenosis without significant left neural foraminal narrowing. Prominent bridging osteophytes seen on the right at this level.  At L3-4, there is diffuse disc bulge with disc desiccation and degenerative intervertebral disc space narrowing. No  definite focal disc protrusion. Severe right with moderate left facet arthrosis is present. There is moderate left with mild right foraminal narrowing with moderate central canal stenosis. Prominent osteophytic spurring is seen at the left aspect of the superior endplate of L4.  At L4-5, there is diffuse disc bulge with severe left and mild right facet arthrosis. A small central disc protrusion is present. No canal stenosis. No significant neural foraminal stenosis appreciated on this examination.  At L5-S1, there is diffuse degenerative intervertebral disc space narrowing with disc desiccation and disc bulging. No definite focal disc protrusion. No canal stenosis. There is moderate left neural foraminal narrowing.  No paraspinous soft tissue abnormality. Prominent atherosclerotic calcifications are noted within the intra-abdominal aorta and bilateral iliac vessels. The visualized visceral structures are within normal limits. No retroperitoneal adenopathy.  IMPRESSION: 1. No acute fracture or listhesis identified within the lumbar spine. 2. Severe levoscoliosis with associated multilevel degenerative disc disease and facet arthropathy as above, most pronounced at the L3-4 level where there is at least moderate central canal stenosis and moderate left neural foraminal narrowing. 3.  Small central disc protrusion at L4-5 without significant canal stenosis. 4. Diffuse osteopenia.   Electronically Signed   By: Jeannine Boga M.D.   On: 01/18/2014 20:38    EKG Interpretation   None       MDM   1. Altered mental status   2. Opioid overdose    80 year old female presents with altered mental status. She states that she's had a lot of back pain recently and has fallen a few times. No head injury, no LOC. Back pain has been present for 20 years due to scoliosis. She does have temazepam and narcotic prescriptions at home. She has temazepam in her purse. She denies taking any extra meds. He she's somnolent, but wakes easily and will carry on conversations. She denies any saddle anesthesia, weakness or numbness in her lower extremities. She has no belly pain. She's not had any shortness of breath, coughing, chest pain. Here vitals are stable. She shown on extremities. Lungs are clear belly is benign. Will do broad-based workup for altered mental status. No help with Narcan. Patient's labs normal. Patient's mental status much improved after some time here. She is feeling better. I informed her she likely took too many narcotics. She agrees and attests to doing so. She lives with grandson, he feels she is improved and is comfortable taking her home. Stable for discharge.    Osvaldo Shipper, MD 01/19/14 1455

## 2014-01-18 NOTE — ED Notes (Signed)
Per EMS- Patient's grandson called when he could not wake his grandmother up. When EMS arrived, patient was lethargic and c/o back pain. Patient stated she took Tamazepam for her back pain. Patient's grandson stated that she has been in the bed x 4 days.

## 2014-01-18 NOTE — ED Notes (Signed)
Patient very lethargic, but answers questions appropriately. Patient reports that she has fallen several times in the past few days and now her back pain is worse. Patient does say tht she has been taking tamazepam for back pain.

## 2014-01-26 ENCOUNTER — Emergency Department (HOSPITAL_COMMUNITY)
Admission: EM | Admit: 2014-01-26 | Discharge: 2014-01-26 | Disposition: A | Discharge status: Home / Self Care | Payer: Medicare Other | Attending: Emergency Medicine | Admitting: Emergency Medicine

## 2014-01-26 ENCOUNTER — Emergency Department (HOSPITAL_COMMUNITY): Payer: Medicare Other

## 2014-01-26 ENCOUNTER — Encounter (HOSPITAL_COMMUNITY): Payer: Self-pay | Admitting: Emergency Medicine

## 2014-01-26 DIAGNOSIS — R5381 Other malaise: Secondary | ICD-10-CM | POA: Insufficient documentation

## 2014-01-26 DIAGNOSIS — Z8639 Personal history of other endocrine, nutritional and metabolic disease: Secondary | ICD-10-CM | POA: Insufficient documentation

## 2014-01-26 DIAGNOSIS — Z8701 Personal history of pneumonia (recurrent): Secondary | ICD-10-CM | POA: Insufficient documentation

## 2014-01-26 DIAGNOSIS — Z87891 Personal history of nicotine dependence: Secondary | ICD-10-CM | POA: Insufficient documentation

## 2014-01-26 DIAGNOSIS — Z862 Personal history of diseases of the blood and blood-forming organs and certain disorders involving the immune mechanism: Secondary | ICD-10-CM | POA: Insufficient documentation

## 2014-01-26 DIAGNOSIS — S0990XA Unspecified injury of head, initial encounter: Secondary | ICD-10-CM | POA: Insufficient documentation

## 2014-01-26 DIAGNOSIS — R42 Dizziness and giddiness: Secondary | ICD-10-CM | POA: Insufficient documentation

## 2014-01-26 DIAGNOSIS — R296 Repeated falls: Secondary | ICD-10-CM | POA: Insufficient documentation

## 2014-01-26 DIAGNOSIS — G8929 Other chronic pain: Secondary | ICD-10-CM

## 2014-01-26 DIAGNOSIS — Z7982 Long term (current) use of aspirin: Secondary | ICD-10-CM | POA: Insufficient documentation

## 2014-01-26 DIAGNOSIS — R5383 Other fatigue: Secondary | ICD-10-CM

## 2014-01-26 DIAGNOSIS — IMO0002 Reserved for concepts with insufficient information to code with codable children: Secondary | ICD-10-CM | POA: Insufficient documentation

## 2014-01-26 DIAGNOSIS — R110 Nausea: Secondary | ICD-10-CM | POA: Insufficient documentation

## 2014-01-26 DIAGNOSIS — G894 Chronic pain syndrome: Secondary | ICD-10-CM | POA: Insufficient documentation

## 2014-01-26 DIAGNOSIS — Y929 Unspecified place or not applicable: Secondary | ICD-10-CM | POA: Insufficient documentation

## 2014-01-26 DIAGNOSIS — M1990 Unspecified osteoarthritis, unspecified site: Secondary | ICD-10-CM | POA: Insufficient documentation

## 2014-01-26 DIAGNOSIS — I10 Essential (primary) hypertension: Secondary | ICD-10-CM | POA: Insufficient documentation

## 2014-01-26 DIAGNOSIS — Y939 Activity, unspecified: Secondary | ICD-10-CM | POA: Insufficient documentation

## 2014-01-26 DIAGNOSIS — Z79899 Other long term (current) drug therapy: Secondary | ICD-10-CM | POA: Insufficient documentation

## 2014-01-26 LAB — CBC WITH DIFFERENTIAL/PLATELET
Basophils Absolute: 0 10*3/uL (ref 0.0–0.1)
Basophils Relative: 0 % (ref 0–1)
Eosinophils Absolute: 0.3 10*3/uL (ref 0.0–0.7)
Eosinophils Relative: 2 % (ref 0–5)
HCT: 42.9 % (ref 36.0–46.0)
HEMOGLOBIN: 14.7 g/dL (ref 12.0–15.0)
LYMPHS ABS: 2.2 10*3/uL (ref 0.7–4.0)
Lymphocytes Relative: 16 % (ref 12–46)
MCH: 28.3 pg (ref 26.0–34.0)
MCHC: 34.3 g/dL (ref 30.0–36.0)
MCV: 82.7 fL (ref 78.0–100.0)
MONOS PCT: 6 % (ref 3–12)
Monocytes Absolute: 0.8 10*3/uL (ref 0.1–1.0)
NEUTROS ABS: 10 10*3/uL — AB (ref 1.7–7.7)
NEUTROS PCT: 75 % (ref 43–77)
PLATELETS: 366 10*3/uL (ref 150–400)
RBC: 5.19 MIL/uL — AB (ref 3.87–5.11)
RDW: 13.4 % (ref 11.5–15.5)
WBC: 13.3 10*3/uL — AB (ref 4.0–10.5)

## 2014-01-26 LAB — COMPREHENSIVE METABOLIC PANEL
ALT: 12 U/L (ref 0–35)
AST: 18 U/L (ref 0–37)
Albumin: 3.6 g/dL (ref 3.5–5.2)
Alkaline Phosphatase: 123 U/L — ABNORMAL HIGH (ref 39–117)
BUN: 19 mg/dL (ref 6–23)
CHLORIDE: 103 meq/L (ref 96–112)
CO2: 24 mEq/L (ref 19–32)
CREATININE: 0.63 mg/dL (ref 0.50–1.10)
Calcium: 9.9 mg/dL (ref 8.4–10.5)
GFR calc Af Amer: >90 mL/min (ref >90)
GFR calc non Af Amer: 83 mL/min — ABNORMAL LOW (ref >90)
Glucose, Bld: 108 mg/dL — ABNORMAL HIGH (ref 70–99)
POTASSIUM: 3.2 meq/L — AB (ref 3.7–5.3)
SODIUM: 141 meq/L (ref 137–147)
Total Bilirubin: 0.5 mg/dL (ref 0.3–1.2)
Total Protein: 7 g/dL (ref 6.0–8.3)

## 2014-01-26 LAB — URINALYSIS, ROUTINE W REFLEX MICROSCOPIC
BILIRUBIN URINE: NEGATIVE
Glucose, UA: NEGATIVE mg/dL
Hgb urine dipstick: NEGATIVE
Ketones, ur: NEGATIVE mg/dL
LEUKOCYTES UA: NEGATIVE
Nitrite: NEGATIVE
PH: 6 (ref 5.0–8.0)
Protein, ur: NEGATIVE mg/dL
Specific Gravity, Urine: 1.021 (ref 1.005–1.030)
UROBILINOGEN UA: 0.2 mg/dL (ref 0.0–1.0)

## 2014-01-26 LAB — PROTIME-INR
INR: 1.02 (ref 0.00–1.49)
Prothrombin Time: 13.2 seconds (ref 11.6–15.2)

## 2014-01-26 MED ORDER — SODIUM CHLORIDE 0.9 % IV BOLUS (SEPSIS): 500.0000 mL | Freq: Once | INTRAVENOUS | Status: DC

## 2014-01-26 MED ORDER — ACETAMINOPHEN 325 MG PO TABS
650.0000 mg | ORAL_TABLET | Freq: Once | ORAL | Status: AC
  Administered 2014-01-26: 650 mg via ORAL
  Filled 2014-01-26: qty 2

## 2014-01-26 NOTE — Progress Notes (Signed)
Patient refused to have bedside commode or wheelchair ordered at this time.

## 2014-01-26 NOTE — Progress Notes (Signed)
   CARE MANAGEMENT ED NOTE 01/26/2014  Patient:  Angelica Bell, Angelica Bell   Account Number:  [id number]  Date Initiated:  01/26/2014  Documentation initiated by:  Livia Snellen  Subjective/Objective Assessment:   Patient presents to Ed after having multiple falls at home     Subjective/Objective Assessment Detail:   Patient with history of chronic back pain     Action/Plan:   Action/Plan Detail:   Anticipated DC Date:  01/26/2014     Status Recommendation to Physician:   Result of Recommendation:    Other ED Services  Consult Working [facility]  CM consult  Other  Outpatient Services - Pt will follow up    Choice offered to / List presented to:  C-1 Patient     [facility] arranged  HH-1 RN  HH-10 DISEASE MANAGEMENT  [facility]      [facility].    Status of service:    ED Comments:   ED Comments Detail:  [facility] asked to speak to patient regarding home health services.  EDCM spoke to patient at bedside.  Patient confirms she lives with her grandson and his partner. Patient also has a daughter who comes and checks in on her. Patient reports hse has a walker and two canes at home. Patient reports hse is able to complete her ADL's on her own. EDCM asked patient's grandson "[name]" how the patient is getting around at home, [place] replied, "Well, it's complicated.  first she will say she can't move and then she's sitting up in the chair."  EDCM explained home health services to patient's daughter and grandson and to patient. EDCM expalined to patient and patient's family, that patient will be receiving a visiting RN, PT, OT, aide and Education officer, museum.  Patient did not have a preference to home health agency, so [facility] was picked, patient in agreement.  Patient confirms her pcp is Dr. Dian Queen, who is her obgyn doctor.  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 Helane Rima  has agreed to be the patient's pcp as per patient.  Patient reports she sees Dr. Nelva Bush every three month, who is an orthopedic doctor.  Patient confirmed her home phone number is [phone] and stated, "I usually don't answer it." [facility] encouraged patient to answer her phone as [facility] will be contacting her within 24-48 hours.  EDCM also acquired patient's grandson's work phone number in case patient doesn't answer her phone.  EDCM explained the benefits of having home health services to patient.  EDCM also provided patient with list of private duty nursing services.  Patient thankful for services.  Patient's grandson and daughter at bedside to take patient home. Patient walked to wheelchair without assistance.  Discussed patient with EDP and EDPA.  No further EDCM needs at this time.

## 2014-01-26 NOTE — ED Provider Notes (Signed)
CSN: [id number]     Arrival date & time 01/26/14  1617 History   First MD Initiated Contact with Patient 01/26/14 1627     Chief Complaint  Patient presents with  . Fall   (Consider location/radiation/quality/duration/timing/severity/associated sxs/prior Treatment) HPI Grandson lives with patient. Also his partner and sometimes his mother.  Patient is a 80 yo female who presents to the ER with multiple falls within the past 24 hours. Patient states that has had at least five falls since midnight. Has hx of chronic back pain and was last seen in ED for multiple falls, altered mental status, and opioid overdose on 01/18/14. Pt complains of lightheadedness, left sided head pain and pain throughout her back. States that sometimes she sees "spots" and feels lightheaded before her falls but most of the time they "just happen." She does use a walker and cane at home, but recent falls have still occurred with their use. She notes that her falls can occur in any position, sitting or standing, and she cannot pinpoint any reason as to why these episodes occur. Has been eating and drinking normally and denies the use of medications recently, except for taking one vicodin last night for pain. Denies numbness, tingling, or weakness in extremities. Denies saddle anesthesia.  Past Medical History  Diagnosis Date  . Osteoarthritis   . Osteoporosis   . Pneumonia   . Chronic pain disorder     with narcotic management  . Hyperlipidemia   . Hypertension   . Chronic back pain     with degenerative joint disease   Past Surgical History  Procedure Laterality Date  . Appendectomy    . Abdominal hysterectomy     No family history on file. History  Substance Use Topics  . Smoking status: Former Smoker -- 13 years    Quit date: 04/15/1976  . Smokeless tobacco: Never Used  . Alcohol Use: No   OB History   Grav Para Term Preterm Abortions TAB SAB Ect Mult Living                 Review of Systems    Constitutional: Negative for fever, chills and appetite change.  HENT: Negative for hearing loss.   Eyes: Negative for photophobia and visual disturbance.  Respiratory: Negative for chest tightness and shortness of breath.   Cardiovascular: Negative for chest pain.  Gastrointestinal: Positive for nausea. Negative for vomiting and diarrhea.  Musculoskeletal: Negative for neck pain.  Neurological: Positive for weakness and light-headedness. Negative for dizziness and syncope.    Allergies  Amoxicillin-pot clavulanate and Sulfonamide derivatives  Home Medications   Current Outpatient Rx  Name  Route  Sig  Dispense  Refill  . ALPRAZolam (XANAX) 0.25 MG tablet   Oral   Take 0.25 mg by mouth 3 (three) times daily as needed for anxiety.         Marland Kitchen amphetamine-dextroamphetamine (ADDERALL XR) 30 MG 24 hr capsule   Oral   Take 30 mg by mouth as needed (only when driving .).          Marland Kitchen aspirin EC 81 MG tablet   Oral   Take 81 mg by mouth daily.         . fentaNYL (DURAGESIC) 100 MCG/HR   Transdermal   Place 100 mcg onto the skin every 3 (three) days. Dr. Nelva Bush          . gabapentin (NEURONTIN) 100 MG capsule   Oral   Take 1 capsule (100 mg  total) by mouth 3 (three) times daily.   90 capsule   0   . HYDROcodone-acetaminophen (NORCO/VICODIN) 5-325 MG per tablet   Oral   Take 1 tablet by mouth every 4 (four) hours as needed for moderate pain.         Marland Kitchen oxycodone (ROXICODONE) 30 MG immediate release tablet   Oral   Take 30 mg by mouth 2 (two) times daily as needed for pain.         Marland Kitchen SILENOR 3 MG TABS   Oral   Take 3 mg by mouth at bedtime as needed (insomnia).          . temazepam (RESTORIL) 30 MG capsule   Oral   Take 30 mg by mouth at bedtime as needed for sleep.           BP 130/71  Pulse 82  Temp(Src) 97.9 F (36.6 C) (Oral)  Resp 16  SpO2 96% Physical Exam  Nursing note and vitals reviewed. Constitutional: She appears well-developed and  well-nourished. No distress.  HENT:  Head: Normocephalic and atraumatic.  Eyes: Pupils are equal, round, and reactive to light.  Neck: Normal range of motion. Neck supple.  Cardiovascular: Normal rate and regular rhythm.   Pulmonary/Chest: Effort normal.  Abdominal: Soft.  Musculoskeletal:       Back:   Equal strength to bilateral lower extremities. Neurosensory function adequate to both legs. Skin color is normal. Skin is warm and moist. I see no step off deformity, no bony tenderness. Pt is able to ambulate without limp. Pain is relieved when sitting in certain positions. ROM is decreased due to pain. No crepitus, laceration, effusion, swelling.  Pulses are normal   Neurological: She is alert.  Skin: Skin is warm and dry.    ED Course  Procedures (including critical care time) Labs Review Labs Reviewed  CBC WITH DIFFERENTIAL - Abnormal; Notable for the following:    WBC 13.3 (*)    RBC 5.19 (*)    Neutro Abs 10.0 (*)    All other components within normal limits  COMPREHENSIVE METABOLIC PANEL - Abnormal; Notable for the following:    Potassium 3.2 (*)    Glucose, Bld 108 (*)    Alkaline Phosphatase 123 (*)    GFR calc non Af Amer 83 (*)    All other components within normal limits  URINALYSIS, ROUTINE W REFLEX MICROSCOPIC  PROTIME-INR   Imaging Review Dg Chest 2 View  01/26/2014   CLINICAL DATA:  80 year old female with frequent falls and right side pain. Initial encounter.  EXAM: CHEST  2 VIEW  COMPARISON:  05/21/2013 and earlier.  FINDINGS: Semi upright AP and lateral views of the chest. Hiatal hernia is less apparent today. Mildly lower lung volumes. Stable mild elevation of the right hemidiaphragm. Normal cardiac size and mediastinal contours. Visualized tracheal air column is within normal limits. No pneumothorax or pulmonary edema. No pleural effusion or confluent pulmonary opacity.  Osteopenia and moderate to severe thoracolumbar scoliosis. No definite acute osseous  abnormality. Cervical carotid calcified atherosclerosis.  IMPRESSION: No acute cardiopulmonary abnormality.   Electronically Signed   By: Lars Pinks M.D.   On: 01/26/2014 17:56   Ct Head Wo Contrast  01/26/2014   CLINICAL DATA:  80 year old female status post fall. Generalized weakness. Initial encounter.  EXAM: CT HEAD WITHOUT CONTRAST  CT CERVICAL SPINE WITHOUT CONTRAST  TECHNIQUE: Multidetector CT imaging of the head and cervical spine was performed following the standard protocol without intravenous contrast.  Multiplanar CT image reconstructions of the cervical spine were also generated.  COMPARISON:  Head CT 01/18/2014. Head and cervical spine CT 04/10/2007.  FINDINGS: CT HEAD FINDINGS  No scalp hematoma identified. Negative orbits soft tissues. Visualized paranasal sinuses and mastoids are clear. No acute osseous abnormality identified.  Calcified atherosclerosis at the skull base. Cerebral volume is within normal limits for age. No midline shift, ventriculomegaly, mass effect, evidence of mass lesion, intracranial hemorrhage or evidence of cortically based acute infarction. Gray-white matter differentiation is within normal limits throughout the brain. No suspicious intracranial vascular hyperdensity.  CT CERVICAL SPINE FINDINGS  Chronic straightening of cervical lordosis. Visualized skull base is intact. No atlanto-occipital dissociation. Cervicothoracic junction alignment is within normal limits. Bilateral posterior element alignment is within normal limits. Widespread chronic disc and endplate degeneration appears not significantly changed. No acute cervical spine fracture identified.  Negative lung apices. Cervical carotid calcified atherosclerosis. Grossly intact visualized upper thoracic levels.  IMPRESSION: 1.  Normal for age noncontrast CT appearance of the brain. 2. No acute fracture or listhesis identified in the cervical spine. Chronic degenerative changes. Ligamentous injury is not excluded.    Electronically Signed   By: Lars Pinks M.D.   On: 01/26/2014 17:47   Ct Cervical Spine Wo Contrast  01/26/2014   CLINICAL DATA:  80 year old female status post fall. Generalized weakness. Initial encounter.  EXAM: CT HEAD WITHOUT CONTRAST  CT CERVICAL SPINE WITHOUT CONTRAST  TECHNIQUE: Multidetector CT imaging of the head and cervical spine was performed following the standard protocol without intravenous contrast. Multiplanar CT image reconstructions of the cervical spine were also generated.  COMPARISON:  Head CT 01/18/2014. Head and cervical spine CT 04/10/2007.  FINDINGS: CT HEAD FINDINGS  No scalp hematoma identified. Negative orbits soft tissues. Visualized paranasal sinuses and mastoids are clear. No acute osseous abnormality identified.  Calcified atherosclerosis at the skull base. Cerebral volume is within normal limits for age. No midline shift, ventriculomegaly, mass effect, evidence of mass lesion, intracranial hemorrhage or evidence of cortically based acute infarction. Gray-white matter differentiation is within normal limits throughout the brain. No suspicious intracranial vascular hyperdensity.  CT CERVICAL SPINE FINDINGS  Chronic straightening of cervical lordosis. Visualized skull base is intact. No atlanto-occipital dissociation. Cervicothoracic junction alignment is within normal limits. Bilateral posterior element alignment is within normal limits. Widespread chronic disc and endplate degeneration appears not significantly changed. No acute cervical spine fracture identified.  Negative lung apices. Cervical carotid calcified atherosclerosis. Grossly intact visualized upper thoracic levels.  IMPRESSION: 1.  Normal for age noncontrast CT appearance of the brain. 2. No acute fracture or listhesis identified in the cervical spine. Chronic degenerative changes. Ligamentous injury is not excluded.   Electronically Signed   By: Lars Pinks M.D.   On: 01/26/2014 17:47    EKG Interpretation   None         MDM   1. Frequent falls   2. Chronic pain    Patient had a thorough work-up to exclude infection in urine or chest. Negative for any significant laboratory abnormalities. Head and neck CT show no acute findings.  Dr. Aline Brochure, F. Saw patient as well. We are concerned that she is taking too much of her pain medications. The pain medications may be causing her to fall, which in turn causes more pain, which in turn causes her to use her medications more. She was given 2 doses of pain medication. We attempted to ambulate her in the ED with assistance but she did not  tolerate it well due to pain.  We spoke with case management regarding her frequent falls. She will provide the patient a home RN, Physical therapy, and medication assistance. Patient does have family members at home.  80 y.o.Lizann H Killmer's evaluation in the Emergency Department is complete. It has been determined that no acute conditions requiring further emergency intervention are present at this time. The patient/guardian have been advised of the diagnosis and plan. We have discussed signs and symptoms that warrant return to the ED, such as changes or worsening in symptoms.  Vital signs are stable at discharge. Filed Vitals:   01/26/14 2029  BP: 130/71  Pulse: 82  Temp:   Resp: 16    Patient/guardian has voiced understanding and agreed to follow-up with the PCP or specialist.     Linus Mako, PA-C 01/26/14 2056

## 2014-01-26 NOTE — ED Notes (Signed)
Pt refused in and out cath

## 2014-01-26 NOTE — ED Notes (Signed)
Pt ambulated with 2 person assist, not tolerated well. Pt unable to get in and out of bed by herself. Pt c/o pain to back and R side.

## 2014-01-26 NOTE — ED Notes (Signed)
Pt's grandson en route to pick up pt

## 2014-01-26 NOTE — ED Notes (Signed)
Case management at bedside talking to pt's grandson on phone

## 2014-01-26 NOTE — ED Notes (Signed)
Bed: WA03 Expected date:  Expected time:  Means of arrival:  Comments: fall 

## 2014-01-26 NOTE — Discharge Instructions (Signed)

## 2014-01-26 NOTE — ED Notes (Signed)
Pt initially refused in and out cath. Pt refused IV fluids, but stated she would let us do the in and out cath to get urine sample if we gave her a cup of ginger ale. Pt drinking ginger ale now.

## 2014-01-26 NOTE — ED Notes (Signed)
Per EMS: Pt fell on Sunday. Has chronic back pain, fall made it worse. Since then has had multiple falls each day. C/o pain from lower to upper back and around ribs on both sides. Pt unable to get off couch to see PCP today, so called 911.

## 2014-01-27 NOTE — ED Provider Notes (Signed)
Medical screening examination/treatment/procedure(s) were conducted as a shared visit with non-physician practitioner(s) and myself.  I personally evaluated the patient during the encounter.  EKG Interpretation   None       I interviewed and examined the patient. Lungs are CTAB. Cardiac exam wnl. Abdomen soft.  Pt a/o x 3, interactive, appears well. Lives w/ her son. Pt has chronic pain issues, on multiple opiates which I believe are related to her falls. I think she needs a home assessment. Care manager involved and will arrange this.   Blanchard Kelch, MD 01/27/14 1204

## 2014-03-15 ENCOUNTER — Ambulatory Visit: Payer: Medicare Other | Admitting: Diagnostic Neuroimaging

## 2014-03-21 ENCOUNTER — Ambulatory Visit: Payer: Self-pay | Admitting: Diagnostic Neuroimaging

## 2014-06-27 ENCOUNTER — Ambulatory Visit (INDEPENDENT_AMBULATORY_CARE_PROVIDER_SITE_OTHER): Payer: Medicare Other | Admitting: Diagnostic Neuroimaging

## 2014-06-27 ENCOUNTER — Encounter: Payer: Self-pay | Admitting: Diagnostic Neuroimaging

## 2014-06-27 ENCOUNTER — Encounter (INDEPENDENT_AMBULATORY_CARE_PROVIDER_SITE_OTHER): Payer: Self-pay

## 2014-06-27 VITALS — BP 152/82 | HR 78 | Ht 62.0 in | Wt 122.0 lb

## 2014-06-27 DIAGNOSIS — F03A0 Unspecified dementia, mild, without behavioral disturbance, psychotic disturbance, mood disturbance, and anxiety: Secondary | ICD-10-CM

## 2014-06-27 DIAGNOSIS — F0390 Unspecified dementia without behavioral disturbance: Secondary | ICD-10-CM

## 2014-06-27 DIAGNOSIS — R413 Other amnesia: Secondary | ICD-10-CM

## 2014-06-27 MED ORDER — DONEPEZIL HCL 5 MG PO TABS: 5.0000 mg | ORAL_TABLET | Freq: Every day | ORAL | Status: DC

## 2014-06-27 NOTE — Progress Notes (Signed)
[facility]  PATIENT: Angelica Bell DOB: 1932-09-07  REFERRING CLINICIAN: Grewal HISTORY FROM: patient  REASON FOR VISIT: new consult   HISTORICAL  CHIEF COMPLAINT:  Chief Complaint  Patient presents with  . Dementia    HISTORY OF PRESENT ILLNESS:   80 year old right-handed female here for evaluation of dementia. Patient accompanied by grandson.  Patient denies any significant memory problems. Technologist some mild memory problems but does not know why this appointment was set up. She feels cold in the exam room and is requesting to go home. She's complaining of back pain.  According to grandson patient has been having 6-12 months of progressive short-term memory problems, paranoia, seeing things (bugs, worms) which has led to her digging into the walls to eradicate them. Patient expressing paranoia towards grandson and his girlfriend, claiming that they are taking things from her. Patient's grandson noted that patient tried putting microwave popcorn in the posterior. She also placed her clothes in the of an to drive them. Patient also having problems with driving. Last month she was lost while driving and the police called grandson to pick her up. Few months ago patient was parked in a bank parking lot for 2 hours, when security found her and called grandson to pick her up.  Patient is able to bathe, eat, take her medicines. She has been paying her bills late. Bill collectors have been calling and leaving messages which the grandson has found. Patient lost her checkbook and check heart at the grocery store recently.  Patient has hearing problems but has never been evaluated by audiology for hearing aid. Patient has not been evaluated for this memory problem recently. Apparently in the past her PCP had placed her on Aricept which she tolerated. However she no longer sees that PCP and she stopped taking Aricept.   REVIEW OF SYSTEMS: Full 14 system review of  systems performed and notable only for restless legs easy bruising fatigue incontinence.  ALLERGIES: Allergies  Allergen Reactions  . Amoxicillin-Pot Clavulanate Diarrhea  . Sulfonamide Derivatives Hives    HOME MEDICATIONS: Outpatient Prescriptions Prior to Visit  Medication Sig Dispense Refill  . amphetamine-dextroamphetamine (ADDERALL XR) 30 MG 24 hr capsule Take 30 mg by mouth as needed (only when driving .).       Marland Kitchen aspirin EC 81 MG tablet Take 81 mg by mouth daily.      . fentaNYL (DURAGESIC) 100 MCG/HR Place 100 mcg onto the skin every 3 (three) days. Dr. Nelva Bush       . HYDROcodone-acetaminophen (NORCO/VICODIN) 5-325 MG per tablet Take 1 tablet by mouth every 4 (four) hours as needed for moderate pain.      . Multiple Vitamin (MULTIVITAMIN) tablet Take 1 tablet by mouth daily.      Marland Kitchen oxycodone (ROXICODONE) 30 MG immediate release tablet Take 30 mg by mouth 2 (two) times daily as needed for pain.      Marland Kitchen SILENOR 3 MG TABS Take 3 mg by mouth at bedtime as needed (insomnia).       . temazepam (RESTORIL) 30 MG capsule Take 30 mg by mouth at bedtime as needed for sleep.       Marland Kitchen ALPRAZolam (XANAX) 0.25 MG tablet Take 0.25 mg by mouth 3 (three) times daily as needed for anxiety.      . gabapentin (NEURONTIN) 100 MG capsule Take 1 capsule (100 mg total) by mouth 3 (three) times daily.  90 capsule  0   No facility-administered medications prior to  visit.    PAST MEDICAL HISTORY: Past Medical History  Diagnosis Date  . Osteoarthritis   . Osteoporosis   . Pneumonia   . Chronic pain disorder     with narcotic management  . Hyperlipidemia   . Hypertension   . Chronic back pain     with degenerative joint disease    PAST SURGICAL HISTORY: Past Surgical History  Procedure Laterality Date  . Appendectomy    . Abdominal hysterectomy      FAMILY HISTORY: Family History  Problem Relation Age of Onset  . Heart Problems Mother     SOCIAL HISTORY:  History   Social History    . Marital Status: Widowed    Spouse Name: N/A    Number of Children: 3  . Years of Education: HS   Occupational History  . Retired    Social History Main Topics  . Smoking status: Former Smoker -- 13 years    Quit date: 04/15/1976  . Smokeless tobacco: Never Used  . Alcohol Use: No  . Drug Use: No  . Sexual Activity: No   Other Topics Concern  . Not on file   Social History Narrative   Patient lives at home with her grandson.   Caffeine Use: none     PHYSICAL EXAM  Filed Vitals:   06/27/14 1058  BP: 152/82  Pulse: 78  Height: 5\' 2"  (1.575 m)  Weight: 122 lb (55.339 kg)    Not recorded    Body mass index is 22.31 kg/(m^2).  GENERAL EXAM: Patient is in no distress; well developed, nourished and groomed; neck is supple  CARDIOVASCULAR: Regular rate and rhythm, no murmurs, no carotid bruits  NEUROLOGIC: MENTAL STATUS: awake, alert, oriented to person, place and time, recent and remote memory intact, normal attention and concentration, language fluent, comprehension intact, naming intact, fund of knowledge appropriate; EXCEPT ARGUMENTATIVE AT TIMES. MMSE 25/30. POSITIVE SNOUT, MYERSONS REFLEXES.  CRANIAL NERVE: no papilledema on fundoscopic exam, pupils equal and reactive to light, visual fields full to confrontation, extraocular muscles intact, no nystagmus, facial sensation and strength symmetric, hearing intact, palate elevates symmetrically, uvula midline, shoulder shrug symmetric, tongue midline. MOTOR: normal bulk and tone, full strength in the BUE; BLE 4 AND LIMITED BY PAIN. SENSORY: normal and symmetric to light touch, pinprick, temperature, vibration COORDINATION: finger-nose-finger, fine finger movements normal REFLEXES: BUE 1, BLE TRACE GAIT/STATION: STOOPED POSTURE. SCOLIOSIS. ANTALGIC GAIT.    DIAGNOSTIC DATA (LABS, IMAGING, TESTING) - I reviewed patient records, labs, notes, testing and imaging myself where available.  Lab Results  Component  Value Date   WBC 13.3* 01/26/2014   HGB 14.7 01/26/2014   HCT 42.9 01/26/2014   MCV 82.7 01/26/2014   PLT 366 01/26/2014      Component Value Date/Time   NA 141 01/26/2014 1715   K 3.2* 01/26/2014 1715   CL 103 01/26/2014 1715   CO2 24 01/26/2014 1715   GLUCOSE 108* 01/26/2014 1715   BUN 19 01/26/2014 1715   CREATININE 0.63 01/26/2014 1715   CALCIUM 9.9 01/26/2014 1715   PROT 7.0 01/26/2014 1715   ALBUMIN 3.6 01/26/2014 1715   AST 18 01/26/2014 1715   ALT 12 01/26/2014 1715   ALKPHOS 123* 01/26/2014 1715   BILITOT 0.5 01/26/2014 1715   GFRNONAA 83* 01/26/2014 1715   GFRAA >90 01/26/2014 1715   Lab Results  Component Value Date   CHOL 186 02/18/2012   HDL 70.00 02/18/2012   LDLCALC 104* 02/18/2012   TRIG 59.0 02/18/2012  CHOLHDL 3 02/18/2012   No results found for this basename: HGBA1C   No results found for this basename: VITAMINB12   Lab Results  Component Value Date   TSH 1.18 02/18/2012    I reviewed images myself and agree with interpretation. -VRP  01/27/14 CT head - mild-mod perisylvian atrophy, mild chronic small vessel ischemic disease    ASSESSMENT AND PLAN  80 y.o. year old female here with progressive short-term memory problems, paranoia, behavior change, getting loss, confusion over past 6-12 months. Suspect underlying neurodegenerative disorder such as Alzheimer's disease or dementia with Louie bodies.  PLAN: - restart donepezil - MRI brain - labs  - audiology testing  - patient should stop driving (due to cognitive impairment, physical pain, psychomotor movements, getting lost) - safety issues reviewed with grandson and patient  Orders Placed This Encounter  Procedures  . MR Brain Wo Contrast  . Vitamin B12  . TSH  . Hemoglobin A1c  . Ambulatory referral to Audiology   Meds ordered this encounter  Medications  . donepezil (ARICEPT) 5 MG tablet    Sig: Take 1 tablet (5 mg total) by mouth at bedtime.    Dispense:  30 tablet    Refill:  3   Return in about 3  months (around 09/27/2014).    Penni Bombard, MD 0/76/8088, 11:03 PM Certified in Neurology, Neurophysiology and Neuroimaging  [facility] [address] [place], [place] [address] [phone]

## 2014-06-27 NOTE — Patient Instructions (Signed)
I will check labs and MRI.  I will refer you to audiology.  Stop driving.

## 2014-07-10 ENCOUNTER — Ambulatory Visit
Admission: RE | Admit: 2014-07-10 | Discharge: 2014-07-10 | Disposition: A | Discharge status: Home / Self Care | Payer: Medicare Other | Source: Ambulatory Visit | Attending: Diagnostic Neuroimaging | Admitting: Diagnostic Neuroimaging

## 2014-07-10 DIAGNOSIS — R413 Other amnesia: Secondary | ICD-10-CM

## 2014-07-10 DIAGNOSIS — F0390 Unspecified dementia without behavioral disturbance: Secondary | ICD-10-CM

## 2014-07-10 DIAGNOSIS — F03A0 Unspecified dementia, mild, without behavioral disturbance, psychotic disturbance, mood disturbance, and anxiety: Secondary | ICD-10-CM

## 2014-07-12 ENCOUNTER — Telehealth: Payer: Self-pay | Admitting: Diagnostic Neuroimaging

## 2014-07-12 NOTE — Telephone Encounter (Signed)
Ok to use gabapentin. -VRP

## 2014-07-12 NOTE — Telephone Encounter (Signed)
Dr Christen Butter office called saying patient would like Rx for Gabapentin and would like to know if Dr Leta Baptist agrees or disagrees with the prescribing of this drug.  Please advise  Thank you.

## 2014-07-12 NOTE — Telephone Encounter (Signed)
Amy with Dr. Christen Butter office calling regarding patient wanting refill of gabapentin and if this is okay with Dr. Leta Baptist or not.  Please call to advise. [phone] ext. [place]

## 2014-07-12 NOTE — Telephone Encounter (Signed)
Calling to speak to a nurse states she was sent for a brain stem that she was unable to complete because she could not lie still for 15 mins and was told to call our office to discuss what to do.

## 2014-07-12 NOTE — Telephone Encounter (Signed)
I called back.  Got no answer.  Left message on Amy's VM relaying Dr Penumalli's note.

## 2014-07-12 NOTE — Telephone Encounter (Signed)
Attempted to call grandson, and was not able to LM.

## 2014-09-06 ENCOUNTER — Telehealth: Payer: Self-pay | Admitting: Diagnostic Neuroimaging

## 2014-09-06 NOTE — Telephone Encounter (Signed)
Offer earlier appt with me this week, or needs ER evaluation if it is more urgent. -VRP

## 2014-09-06 NOTE — Telephone Encounter (Signed)
Spoke to the patient's grandson Angelica Bell. He wanted to make Dr. Leta Baptist aware of what is going on with the patient. The patient has been driving and behaving erratically. A sheriff had to escort her home on Sunday because she got confused while driving. [name] states if the family tries to get her to stop driving she becomes very angry with them. I offered an earlier appt, but the grandson said no one is able to get off work and come with her. He is only available on her upcoming appt (09/27/14) because he requested it off after the last office visit. He does not think it is a good idea to mention while in the upcoming visit that he called and told Dr. Leta Baptist about the sheriff having to escort the patient home. He says she is very confused and he does not know what else to do for her.

## 2014-09-06 NOTE — Telephone Encounter (Signed)
Patient's grandson Angelica Bell @ [phone] stated patient had episode Sunday.  Patient was disoriented and sheriff had to escort home.  Please return call at work number and if not available please leave message.

## 2014-09-07 NOTE — Telephone Encounter (Signed)
No one is available to come with patient to appt. Will wait until upcoming appt on  09/27/14.

## 2014-09-17 IMAGING — CT CT HEAD W/O CM
2 series · 17 of 30 positions shown, 20 images · non-contrast
Comparison: 04/10/2007

CLINICAL DATA: Multiple recent falls.

EXAM:
CT HEAD WITHOUT CONTRAST
TECHNIQUE: Contiguous axial images were obtained from the base of the skull
through the vertex without contrast.

[Series 2: head w/o · axial · non-contrast · 0.48mm/px · z∈[-168,-62]mm · 9 of 27 slices shown, 12 images]
[im 3/27  brain]
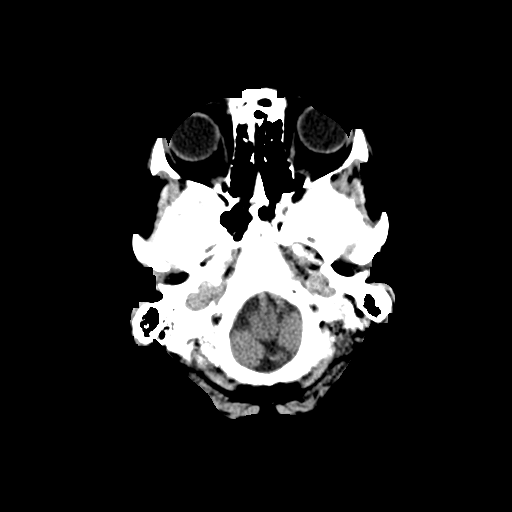
[im 3/27  bone]
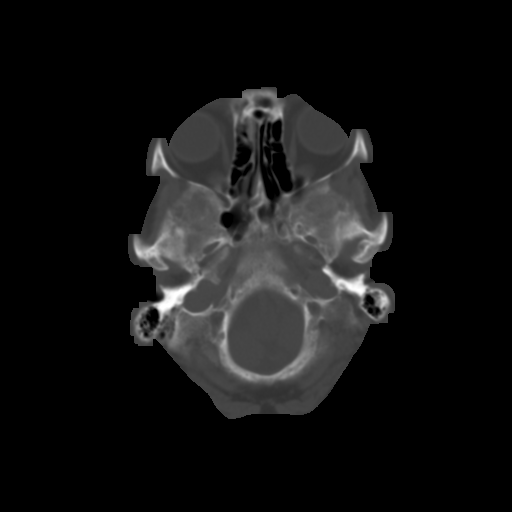
[im 6/27  brain]
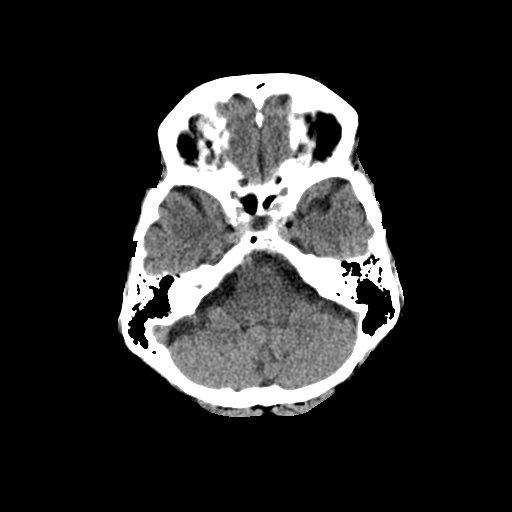
[im 8/27  brain]
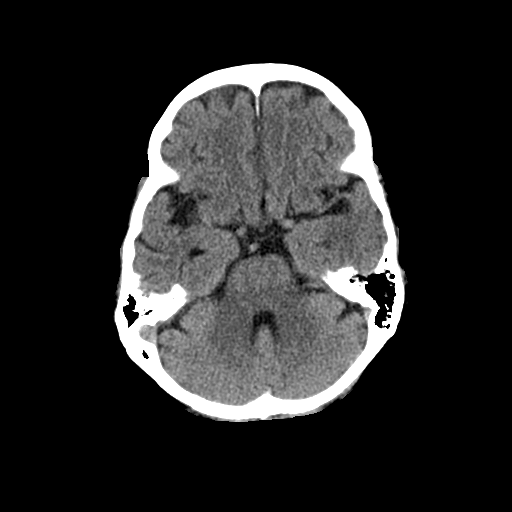
[im 11/27  brain]
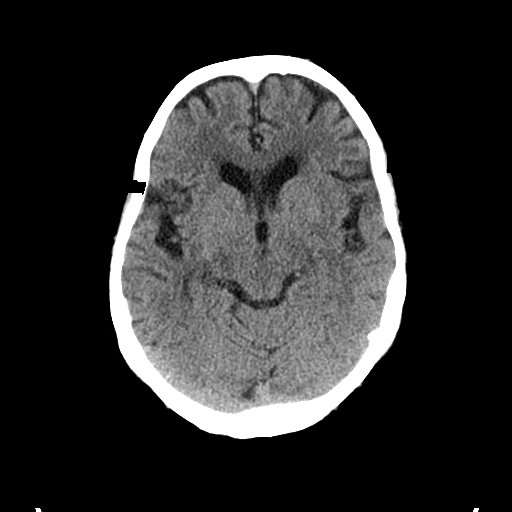
[im 14/27  brain]
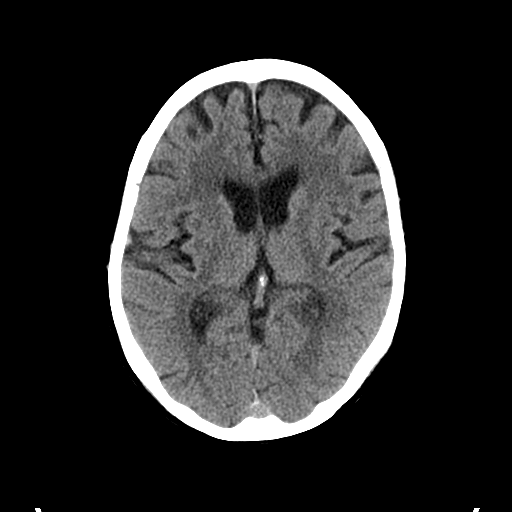
[im 14/27  bone]
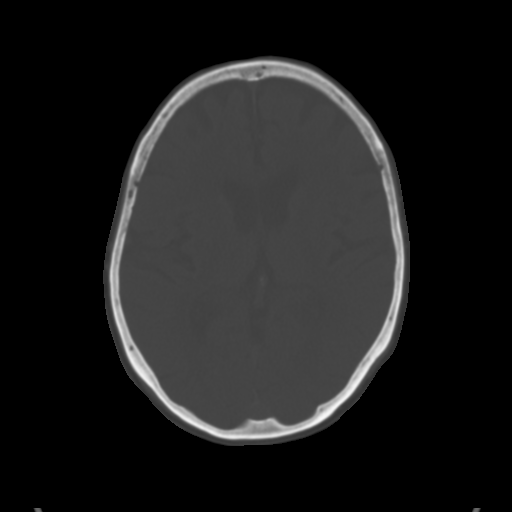
[im 16/27  brain]
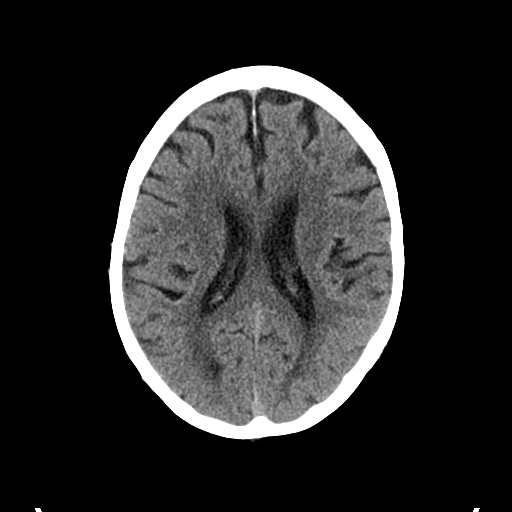
[im 19/27  brain]
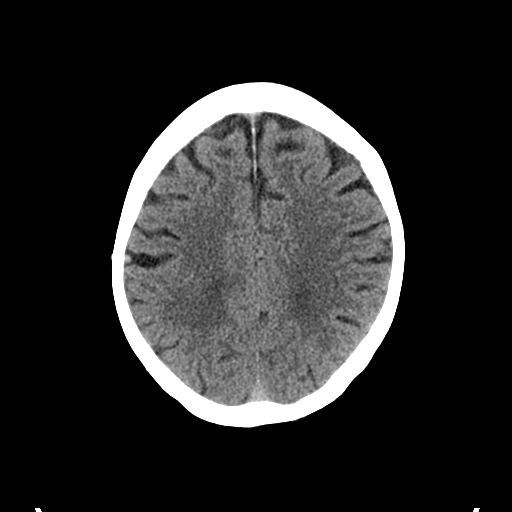
[im 21/27  brain]
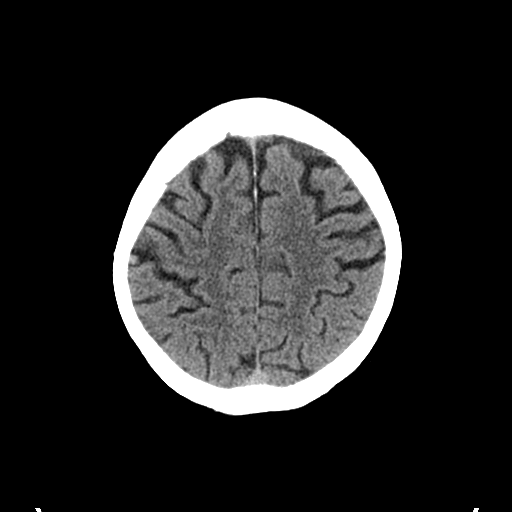
[im 24/27  brain]
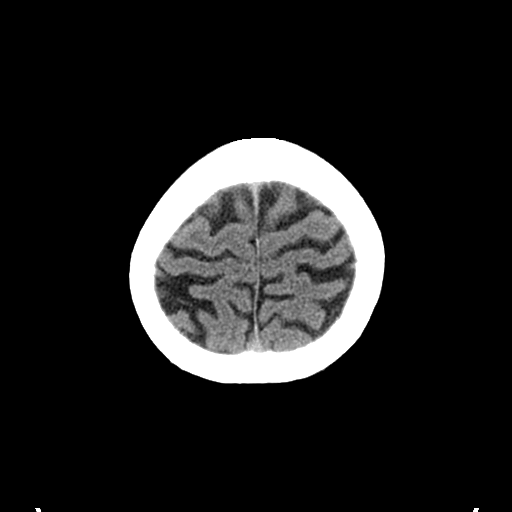
[im 24/27  bone]
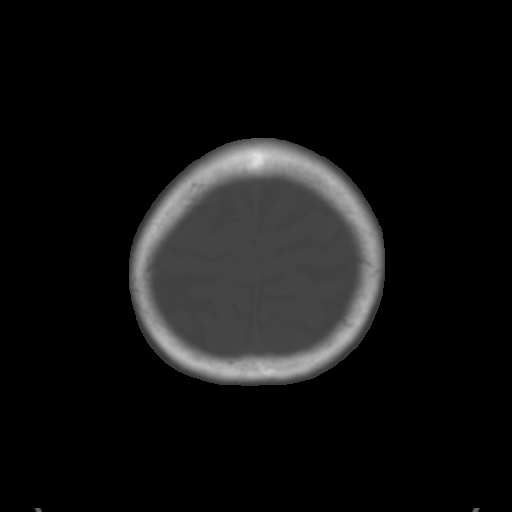

[Series 3: bone windows · axial · 0.48mm/px · z∈[-166,-60]mm · 8 of 45 slices shown]
[im 5/45  bone]
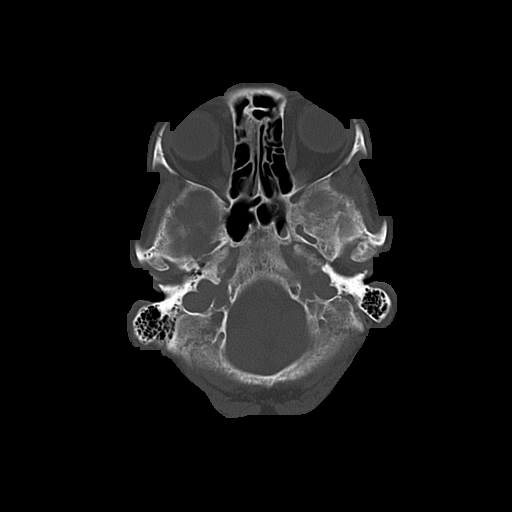
[im 10/45  bone]
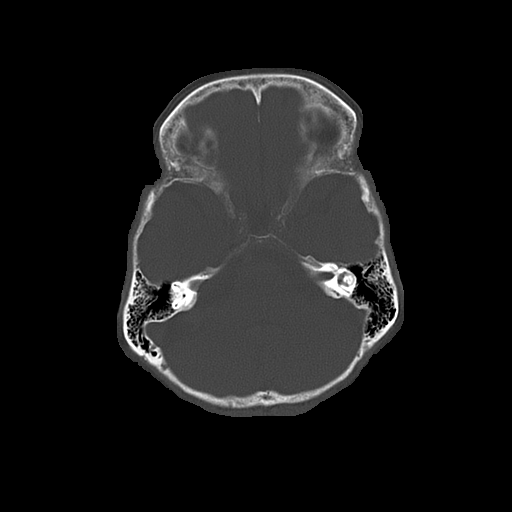
[im 15/45  bone]
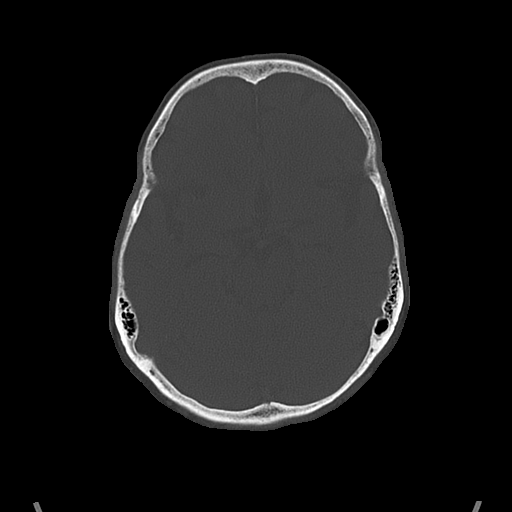
[im 20/45  bone]
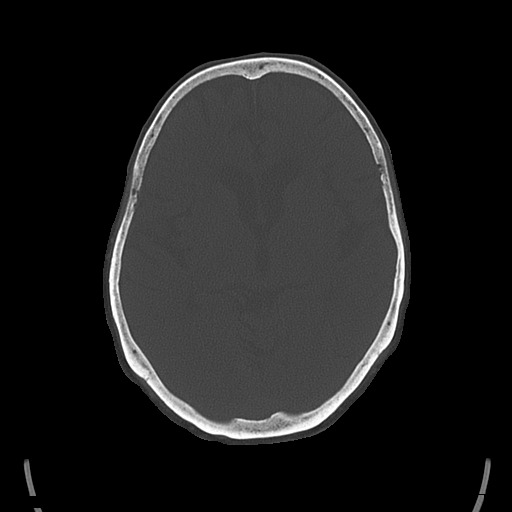
[im 25/45  bone]
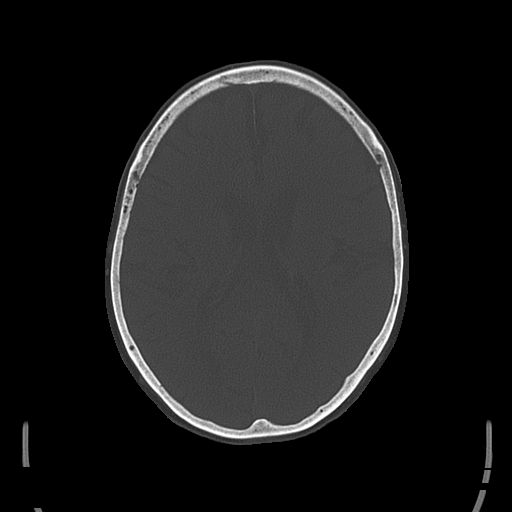
[im 30/45  bone]
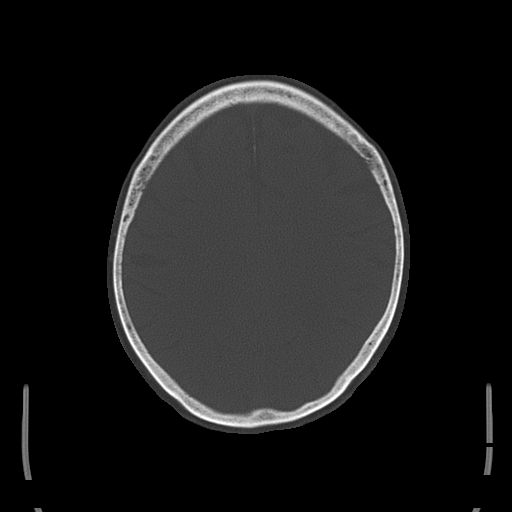
[im 35/45  bone]
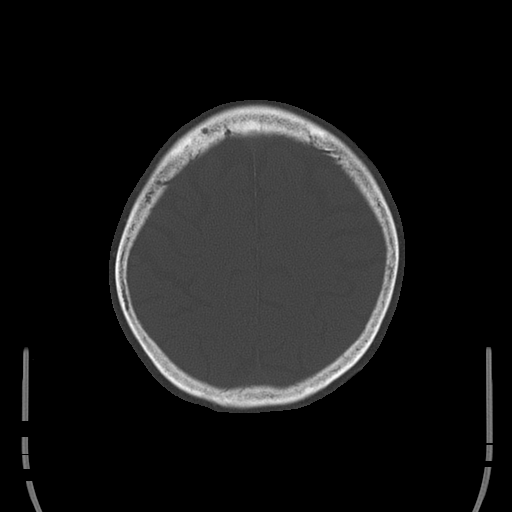
[im 40/45  bone]
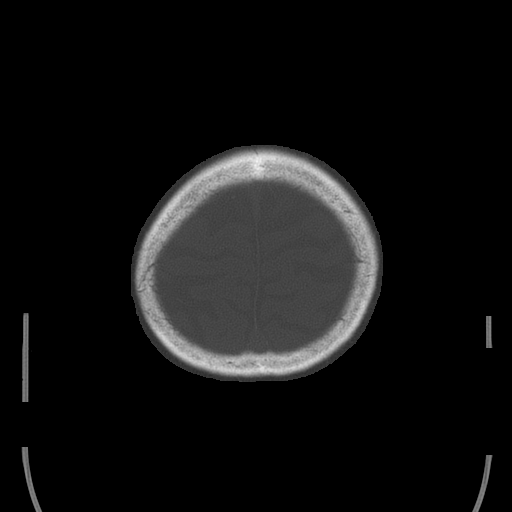

[17 of 30 positions shown; findings below may reference images not displayed]

FINDINGS: No evidence for acute hemorrhage, mass lesion, midline shift,
hydrocephalus or large infarct. There is mild atrophy. There may be
mild sinus disease in left sphenoid sinus. Otherwise, the paranasal
sinuses are clear. No acute bone abnormality.
IMPRESSION: No acute intracranial abnormality.

## 2014-09-25 IMAGING — CT CT HEAD W/O CM
4 series · 17 of 30 positions shown, 19 images · non-contrast
Comparison: Head CT 01/18/2014. Head and cervical spine CT
04/10/2007.

CLINICAL DATA: 81-year-old female status post fall. Generalized
weakness. Initial encounter.

EXAM:
CT HEAD WITHOUT CONTRAST
CT CERVICAL SPINE WITHOUT CONTRAST
TECHNIQUE: Multidetector CT imaging of the head and cervical spine was
performed following the standard protocol without intravenous
contrast. Multiplanar CT image reconstructions of the cervical spine
were also generated.

[Series 3: head w/o · axial · non-contrast · 0.43mm/px · z∈[-114,-69]mm · 2 of 29 slices shown]
[im 10/29  brain]
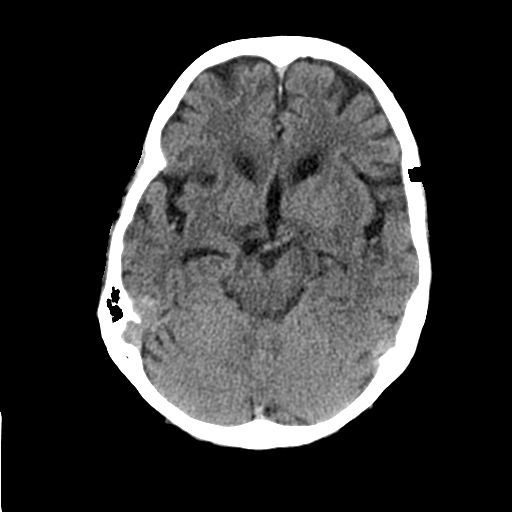
[im 19/29  brain]
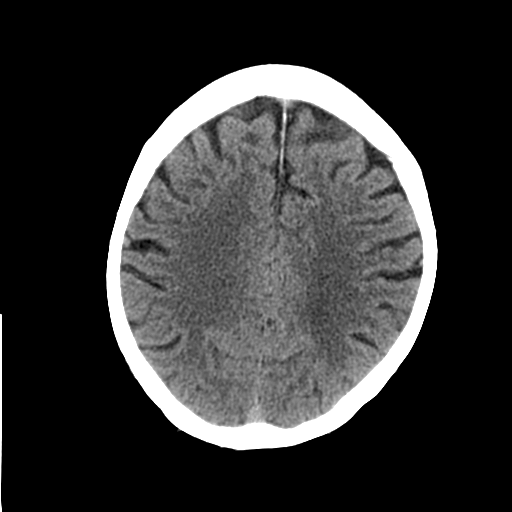

[Series 4: bone windows · axial · 0.43mm/px · z∈[-132,-48]mm · 4 of 48 slices shown]
[im 10/48  bone]
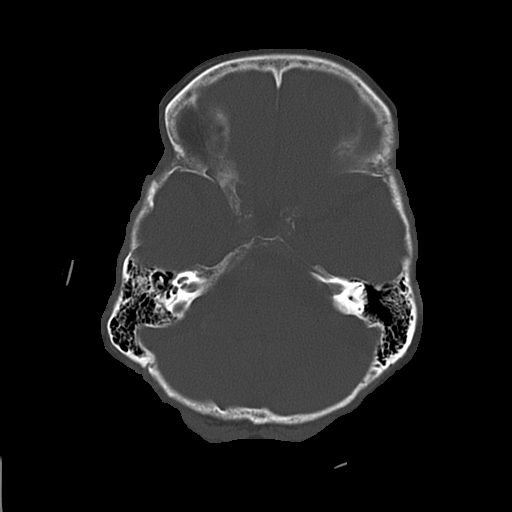
[im 19/48  bone]
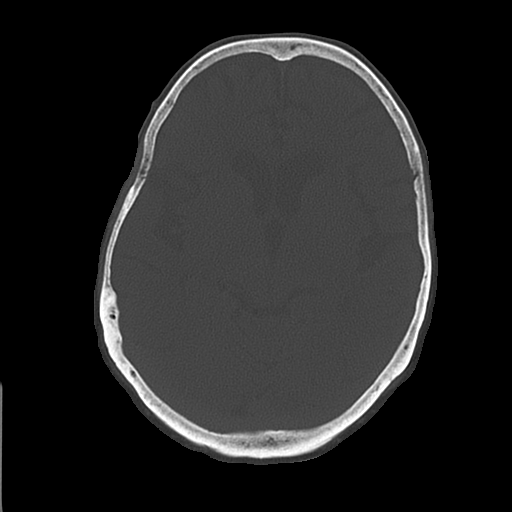
[im 29/48  bone]
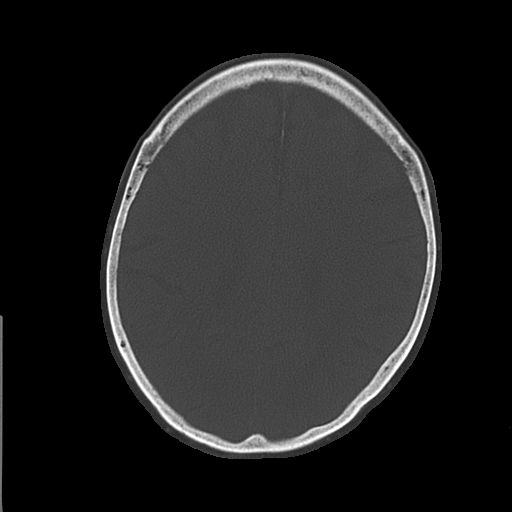
[im 38/48  bone]
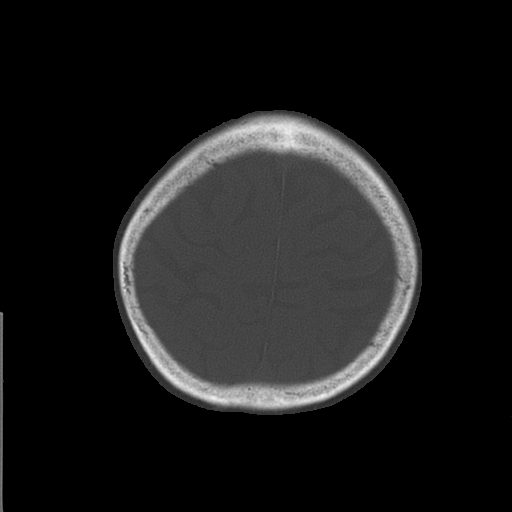

[Series 5: c-spine st · axial · 0.26mm/px · z∈[-278,-242]mm · 3 of 81 slices shown]
[im 9/81  brain]
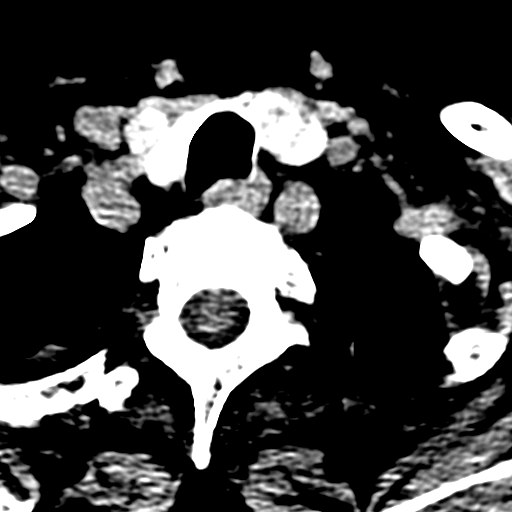
[im 18/81  brain]
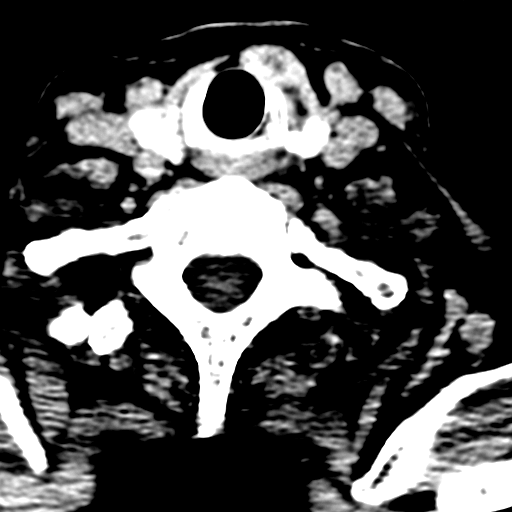
[im 27/81  brain]
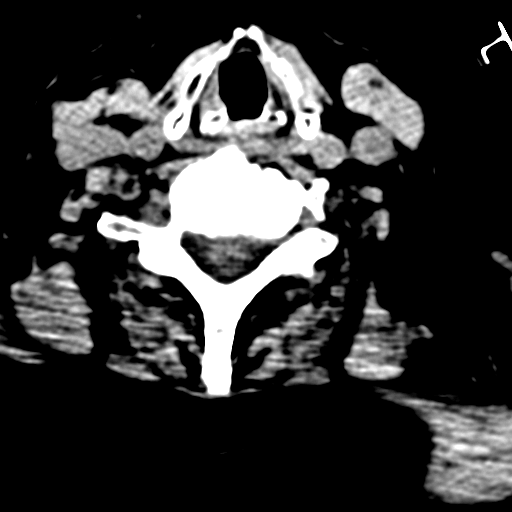

[Series 8: axial recon · axial · 0.23mm/px · z∈[-290,-177]mm · 8 of 78 slices shown, 10 images]
[im 9/78  brain]
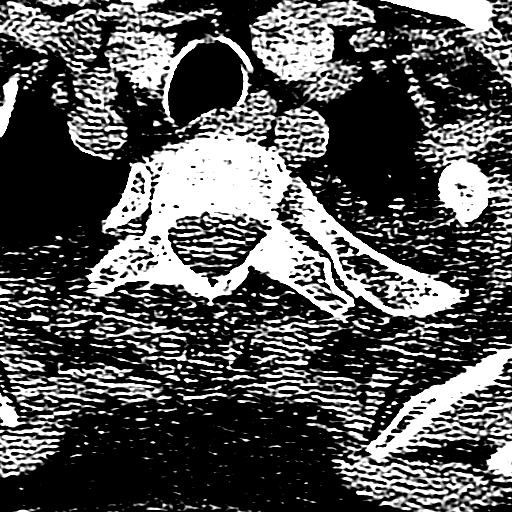
[im 9/78  bone]
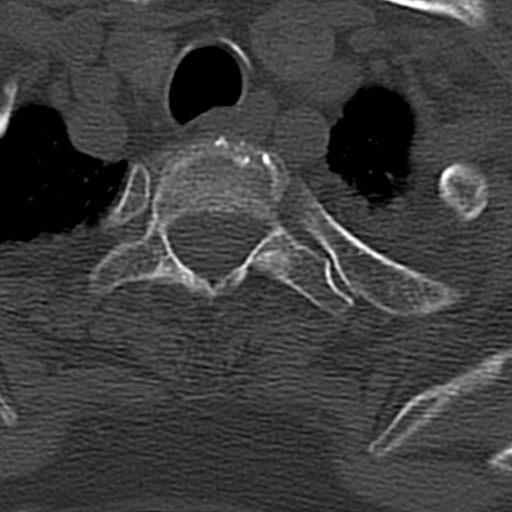
[im 18/78  brain]
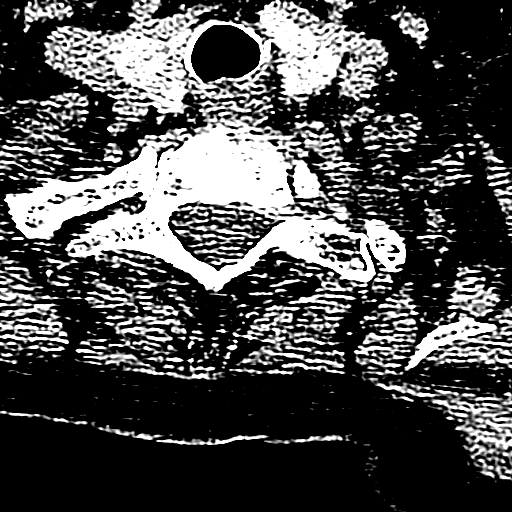
[im 26/78  brain]
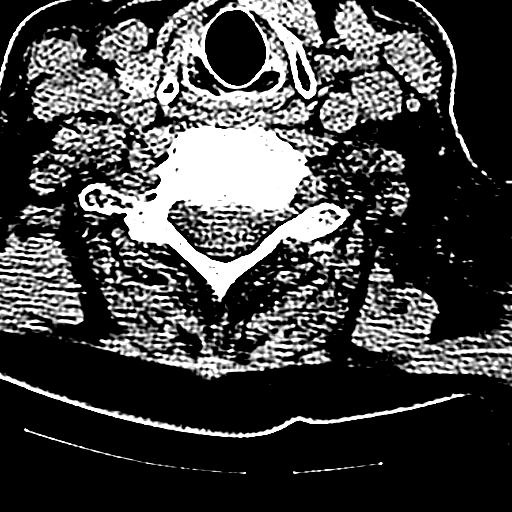
[im 35/78  brain]
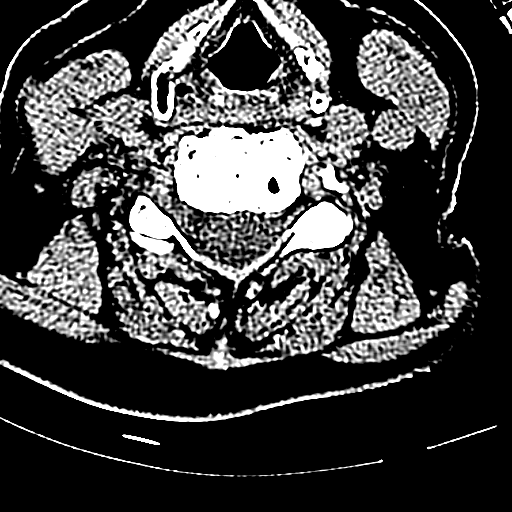
[im 43/78  brain]
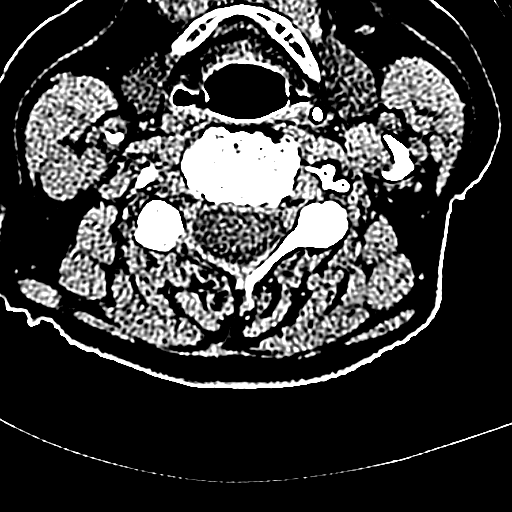
[im 43/78  bone]
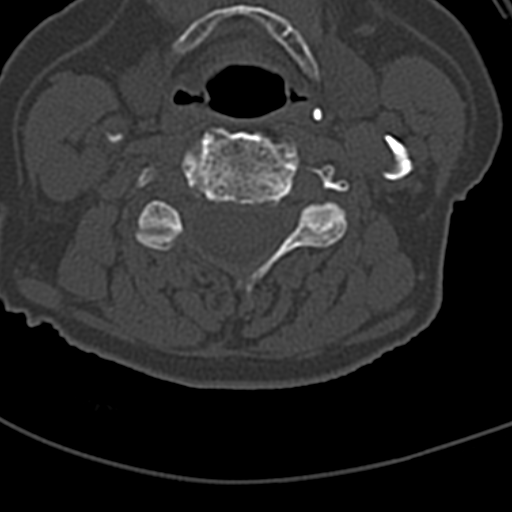
[im 52/78  brain]
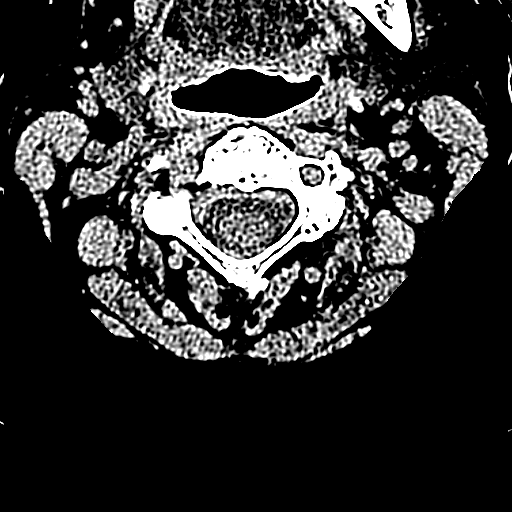
[im 60/78  brain]
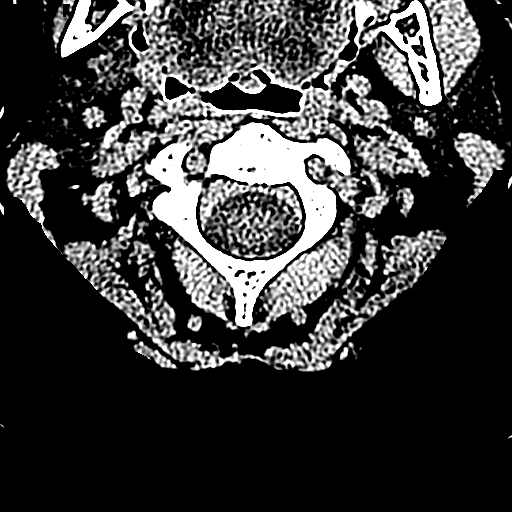
[im 69/78  brain]
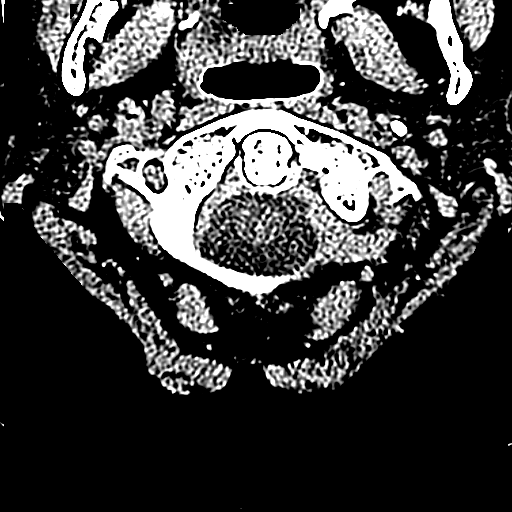

[17 of 30 positions shown; findings below may reference images not displayed]

FINDINGS: CT HEAD FINDINGS

No scalp hematoma identified. Negative orbits soft tissues.
Visualized paranasal sinuses and mastoids are clear. No acute
osseous abnormality identified.

Calcified atherosclerosis at the skull base. Cerebral volume is
within normal limits for age. No midline shift, ventriculomegaly,
mass effect, evidence of mass lesion, intracranial hemorrhage or
evidence of cortically based acute infarction. Gray-white matter
differentiation is within normal limits throughout the brain. No
suspicious intracranial vascular hyperdensity.

CT CERVICAL SPINE FINDINGS

Chronic straightening of cervical lordosis. Visualized skull base is
intact. No atlanto-occipital dissociation. Cervicothoracic junction
alignment is within normal limits. Bilateral posterior element
alignment is within normal limits. Widespread chronic disc and
endplate degeneration appears not significantly changed. No acute
cervical spine fracture identified.

Negative lung apices. Cervical carotid calcified atherosclerosis.
Grossly intact visualized upper thoracic levels.
IMPRESSION: 1.  Normal for age noncontrast CT appearance of the brain.
2. No acute fracture or listhesis identified in the cervical spine.
Chronic degenerative changes. Ligamentous injury is not excluded.

## 2014-09-25 IMAGING — CR DG CHEST 2V
3 series · 3 of 3 positions shown · non-contrast
Comparison: 05/21/2013 and earlier.

CLINICAL DATA: 81-year-old female with frequent falls and right
side pain. Initial encounter.

EXAM:
CHEST  2 VIEW

[AP]
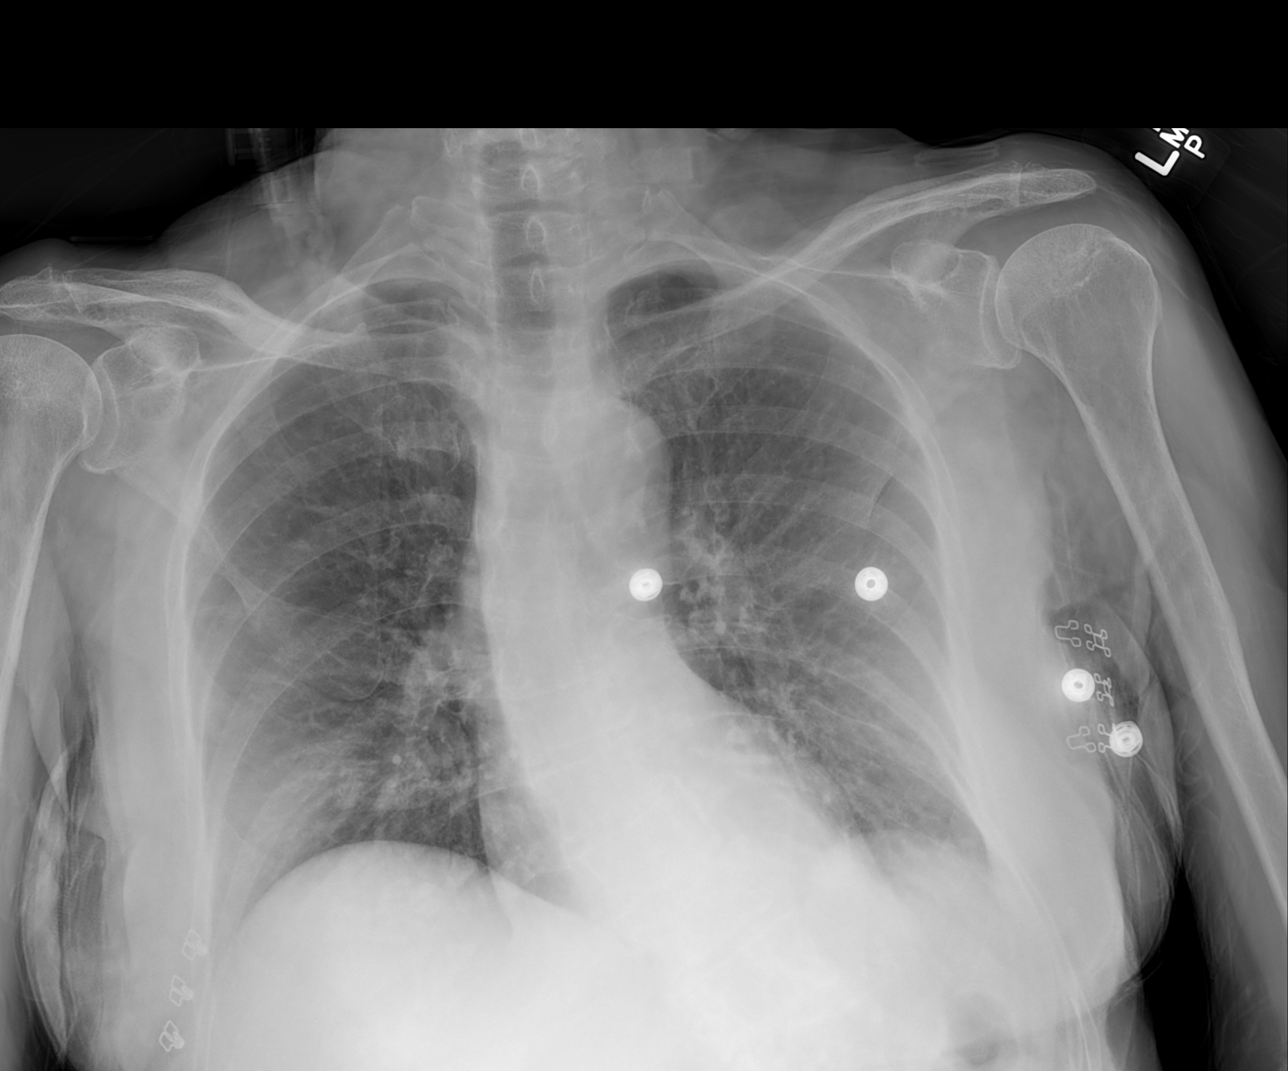

[w chest lat (1 of 2)]
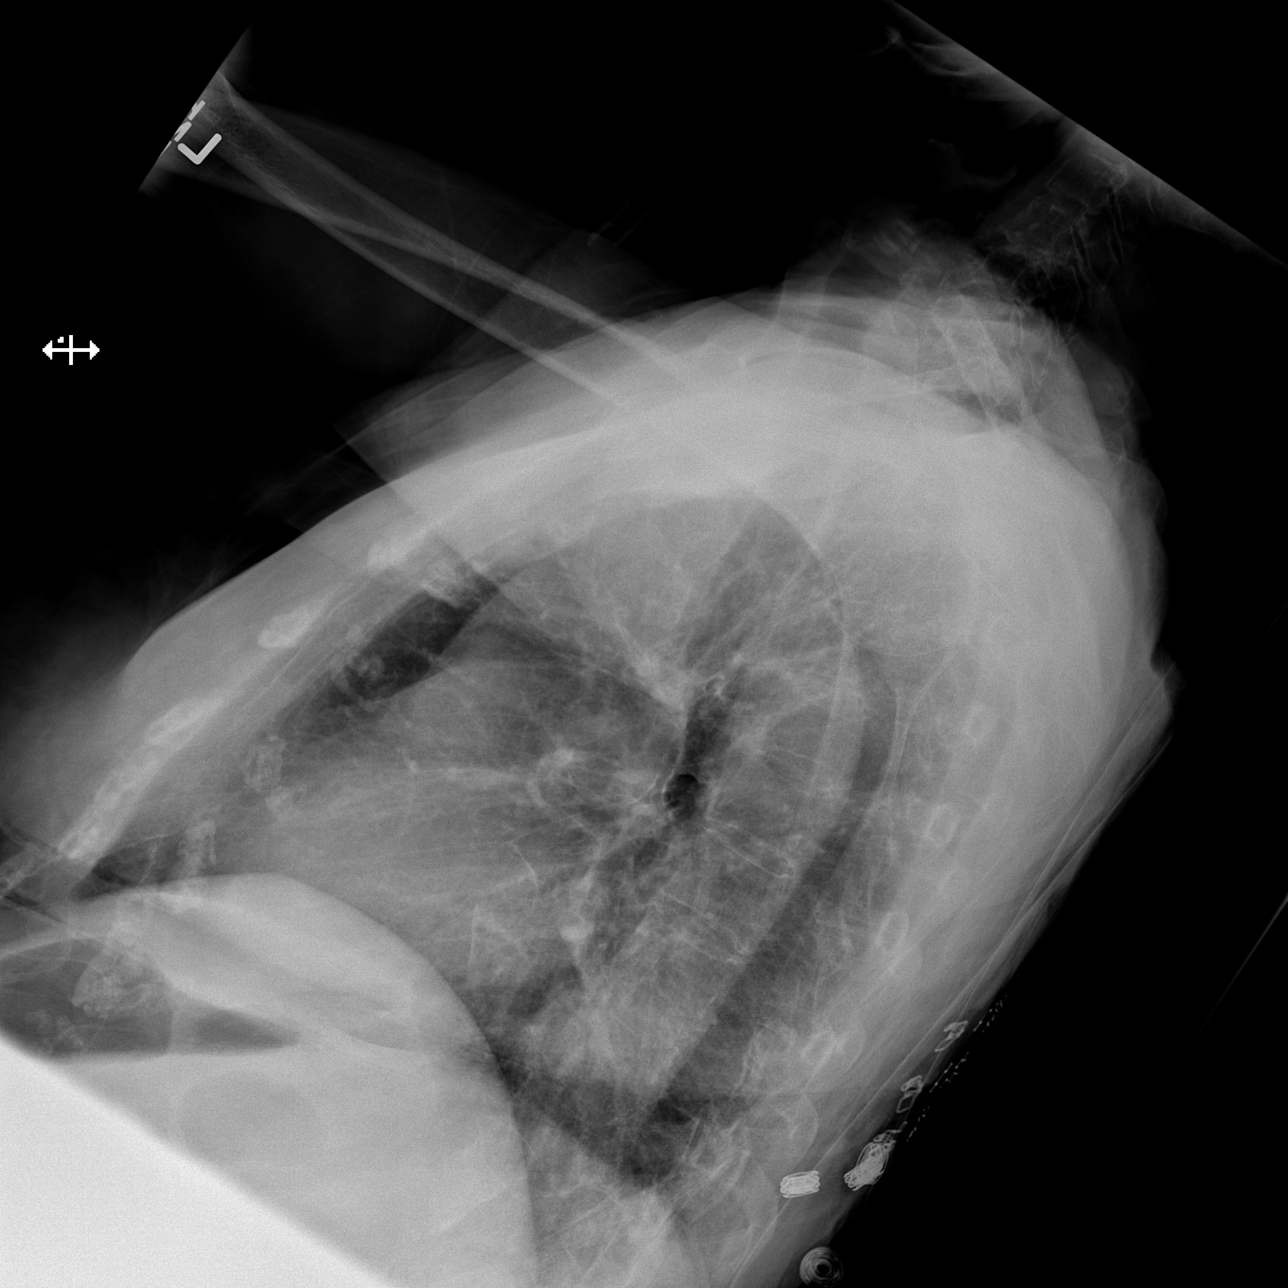

[w chest lat (2 of 2)]
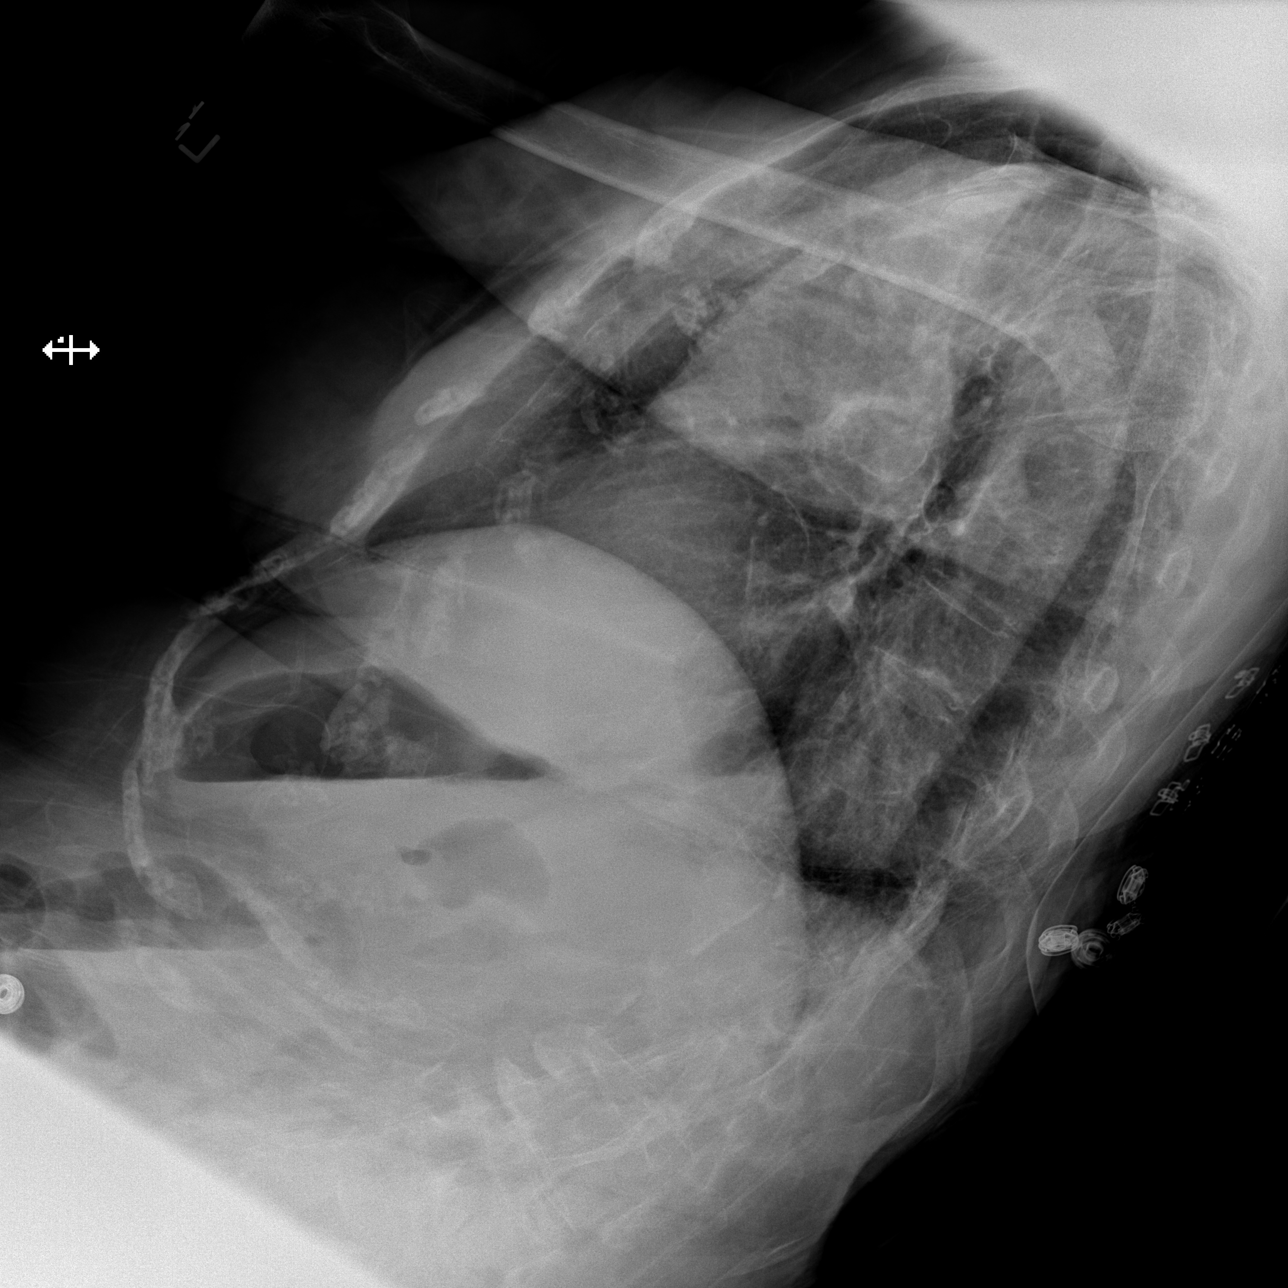

[3 of 3 positions shown; findings below may reference images not displayed]

FINDINGS: Semi upright AP and lateral views of the chest. Hiatal hernia is
less apparent today. Mildly lower lung volumes. Stable mild
elevation of the right hemidiaphragm. Normal cardiac size and
mediastinal contours. Visualized tracheal air column is within
normal limits. No pneumothorax or pulmonary edema. No pleural
effusion or confluent pulmonary opacity.

Osteopenia and moderate to severe thoracolumbar scoliosis. No
definite acute osseous abnormality. Cervical carotid calcified
atherosclerosis.
IMPRESSION: No acute cardiopulmonary abnormality.

## 2014-09-27 ENCOUNTER — Ambulatory Visit: Payer: Medicare Other | Admitting: Diagnostic Neuroimaging

## 2014-10-04 ENCOUNTER — Ambulatory Visit: Payer: Medicare Other | Attending: Audiology | Admitting: Audiology

## 2014-10-04 DIAGNOSIS — H93213 Auditory recruitment, bilateral: Secondary | ICD-10-CM | POA: Insufficient documentation

## 2014-10-04 DIAGNOSIS — H93293 Other abnormal auditory perceptions, bilateral: Secondary | ICD-10-CM | POA: Diagnosis not present

## 2014-10-04 DIAGNOSIS — H903 Sensorineural hearing loss, bilateral: Secondary | ICD-10-CM | POA: Diagnosis not present

## 2014-10-04 NOTE — Procedures (Signed)
Outpatient Rehabilitation and [facility] [address] [place], [place] [address] [place] EVALUATION  Name: Angelica Bell "Angelica Bell" DOB:  Jan 09, 1932 MRN:  [id number]                                 Diagnosis: Hearing loss Date: 10/04/2014    Referent: Dr. Leta Baptist  HISTORY: Angelica Bell, age 80 y.o. years, was seen for an audiological evaluation and reports a "worsening of hearing over the past two months".  She states that she has difficulty hearing "unless people are looking at her".   Angelica Bell has a history of "sound sensitivity" that she thinks has "gotten worse over the past several years".  She states that sounds such as "PA systems" and "loud sounds".  Angelica Bell reports that she "has to turn the TV up very loud" and her grandson who lives with her tells her to "turn it down".        EVALUATION: Pure tone air and tone conduction was completed using conventional audiometry with inserts. She has symmetrical hearing thresholds of 30-35 dBHL from '250Hz'  - '500Hz' ; 30-35 dBHL at '1000Hz' ; 55-65 dBHL at '2000Hz' ; 65-70 dBHL at '4000Hz'  and 70 dBHL at '8000Hz' .  Speech reception thresholds are 35 dBHL in the right ear and 50 dBHL in the left ear using recorded spondee words.  The reliability is good.  Word recognition is 82% at 65dBHL in the right and 54% at 65dBHL in the left using recorded NU-6 word lists in quiet. Please note that trying to use louder levels was uncomfortably loud. Otoscopic inspection reveals clear ear canals with visible tympanic membranes in each ear.  Tympanometry was not completed because Angelica Bell she said it "was too loud".  CONCLUSION:      Angelica Bell has mild low frequency hearing loss sloping to a moderate to severe high frequency hearing loss in the right ear.  The hearing loss is primarily sensorineural- however there is a slight low frequency mixed component at '250Hz' .  Angelica Bell has good word recognition in the right ear and poor  word recognition on the left side at normal conversational speech levels, quiet.       Angelica Bell is extremely sensitive to the volume of sound with severe recruitment.  She reports levels of 70-75 dBHL as "too loud".  Tympanometry was not completed because it was "too loud" so it was not completed. Although Angelica Bell is a candidate for a hearing aid, care about the volume will be needed when fitting.    RECOMMENDATIONS: 1.   Further evaluation by an Ear, Nose and Throat physician, to evaluate recruitment, hearing loss and poorer word recognition on the left side. 2.   A hearing aid evaluation.    3.  FYI - [employer] in [place] may help with obtaining one hearing aid or one captioned telephone if hearing loss and financial qualifications are met.  Please contact Rex Kras at [phone] . The following areas work with Ms. Bell: [facility]  [place] [phone] & [place] [phone] [facility] [place], [place] [facility]  [place] [phone] [facility] & [facility] [place] [phone], [place]  [phone]    Angelica Bell, Au.D., CCC-A Doctor of  Audiology 10/04/2014   cc: Angelica Salt, MD

## 2014-10-12 ENCOUNTER — Other Ambulatory Visit (INDEPENDENT_AMBULATORY_CARE_PROVIDER_SITE_OTHER): Payer: Self-pay

## 2014-10-12 ENCOUNTER — Telehealth: Payer: Self-pay | Admitting: Diagnostic Neuroimaging

## 2014-10-12 DIAGNOSIS — Z0289 Encounter for other administrative examinations: Secondary | ICD-10-CM

## 2014-10-12 NOTE — Telephone Encounter (Signed)
Patient requesting is she could have lab work as discussed at [place] 06/27/14 prior to her next appointment on 10/27.  Please call and advise

## 2014-10-12 NOTE — Telephone Encounter (Signed)
See phone note

## 2014-10-12 NOTE — Telephone Encounter (Signed)
Called patient to let her know she may come by to have her labs drawn anytime this week per Dr. Mamie Nick.

## 2014-10-12 NOTE — Telephone Encounter (Signed)
Yes. I had ordered b12, TSH, A1c at last visit. She can come in anytime this week to have labs. -VRP

## 2014-10-13 LAB — HEMOGLOBIN A1C
ESTIMATED AVERAGE GLUCOSE: 131 mg/dL
Hgb A1c MFr Bld: 6.2 % — ABNORMAL HIGH (ref 4.8–5.6)

## 2014-10-13 LAB — TSH: TSH: 1.74 u[IU]/mL (ref 0.450–4.500)

## 2014-10-13 LAB — VITAMIN B12: VITAMIN B 12: 730 pg/mL (ref 211–946)

## 2014-10-25 ENCOUNTER — Encounter: Payer: Self-pay | Admitting: Diagnostic Neuroimaging

## 2014-10-25 ENCOUNTER — Ambulatory Visit (INDEPENDENT_AMBULATORY_CARE_PROVIDER_SITE_OTHER): Payer: Medicare Other | Admitting: Diagnostic Neuroimaging

## 2014-10-25 VITALS — BP 163/95 | HR 91 | Ht 61.0 in | Wt 126.8 lb

## 2014-10-25 DIAGNOSIS — R413 Other amnesia: Secondary | ICD-10-CM

## 2014-10-25 NOTE — Progress Notes (Signed)
[facility]  PATIENT: Angelica Bell DOB: 02-09-1932  REFERRING CLINICIAN: Grewal HISTORY FROM: patient  REASON FOR VISIT: follow up   HISTORICAL  CHIEF COMPLAINT:  Chief Complaint  Patient presents with  . Follow-up    memory loss    HISTORY OF PRESENT ILLNESS:   UPDATE 10/25/14: Since last visit patient did not have MRI done because she could not tolerate laying flat. Also patient's grandson and his girlfriend moved out of patient's home yesterday. Today patient tells me that she does not have any memory problems. She tells me that her grandson was also falsifiying information and claiming that she had memory problems in an attempt to take her house from her. Patient has taken her grandson off of her designated medical contact list. Patient asked me to call her daughter Angelica Bell) to discuss. I called patient's daughter and discussed symptoms and claims. Patient's daughter says that patient has no memory issues, cognitive decline or any problems with activities of daily living. Patient's daughter talks to patient and sees her almost on daily basis.  PRIOR HPI (06/27/14) 80 year old right-handed female here for evaluation of dementia. Patient accompanied by grandson. Patient denies any significant memory problems. Acknowledges some mild memory problems but does not know why this appointment was set up. She feels cold in the exam room and is requesting to go home. She's complaining of back pain. According to grandson patient has been having 6-12 months of progressive short-term memory problems, paranoia, seeing things (bugs, worms) which has led to her digging into the walls to eradicate them. Patient expressing paranoia towards grandson and his girlfriend, claiming that they are taking things from her. Patient's grandson noted that patient tried putting microwave popcorn in the toaster, She also placed her clothes in an oven to dry them. Patient also having problems  with driving. Last month she was lost while driving and the police called grandson to pick her up. Few months ago patient was parked in a bank parking lot for 2 hours, when security found her and called grandson to pick her up. Patient is able to bathe, eat, take her medicines. She has been paying her bills late. Bill collectors have been calling and leaving messages which the grandson has found. Patient lost her checkbook and check heart at the grocery store recently. Patient has hearing problems but has never been evaluated by audiology for hearing aid. Patient has not been evaluated for this memory problem recently. Apparently in the past her PCP had placed her on Aricept which she tolerated. However she no longer sees that PCP and she stopped taking Aricept.   REVIEW OF SYSTEMS: Full 14 system review of systems performed and notable only nothing except as per HPI.   ALLERGIES: Allergies  Allergen Reactions  . Amoxicillin-Pot Clavulanate Diarrhea  . Sulfonamide Derivatives Hives    HOME MEDICATIONS: Outpatient Prescriptions Prior to Visit  Medication Sig Dispense Refill  . amphetamine-dextroamphetamine (ADDERALL XR) 30 MG 24 hr capsule Take 30 mg by mouth as needed (only when driving .).       Marland Kitchen aspirin EC 81 MG tablet Take 81 mg by mouth daily.      Marland Kitchen donepezil (ARICEPT) 5 MG tablet Take 1 tablet (5 mg total) by mouth at bedtime.  30 tablet  3  . fentaNYL (DURAGESIC) 100 MCG/HR Place 100 mcg onto the skin every 3 (three) days. Dr. Nelva Bush       . HYDROcodone-acetaminophen (NORCO/VICODIN) 5-325 MG per tablet Take 1 tablet  by mouth every 4 (four) hours as needed for moderate pain.      . Multiple Vitamin (MULTIVITAMIN) tablet Take 1 tablet by mouth daily.      Marland Kitchen oxycodone (ROXICODONE) 30 MG immediate release tablet Take 30 mg by mouth 2 (two) times daily as needed for pain.      Marland Kitchen SILENOR 3 MG TABS Take 3 mg by mouth at bedtime as needed (insomnia).       . temazepam (RESTORIL) 30 MG capsule  Take 30 mg by mouth at bedtime as needed for sleep.        No facility-administered medications prior to visit.    PAST MEDICAL HISTORY: Past Medical History  Diagnosis Date  . Osteoarthritis   . Osteoporosis   . Pneumonia   . Chronic pain disorder     with narcotic management  . Hyperlipidemia   . Hypertension   . Chronic back pain     with degenerative joint disease    PAST SURGICAL HISTORY: Past Surgical History  Procedure Laterality Date  . Appendectomy    . Abdominal hysterectomy      FAMILY HISTORY: Family History  Problem Relation Age of Onset  . Heart Problems Mother     SOCIAL HISTORY:  History   Social History  . Marital Status: Widowed    Spouse Name: N/A    Number of Children: 3  . Years of Education: HS   Occupational History  . Retired    Social History Main Topics  . Smoking status: Former Smoker -- 13 years    Quit date: 04/15/1976  . Smokeless tobacco: Never Used  . Alcohol Use: No  . Drug Use: No  . Sexual Activity: No   Other Topics Concern  . Not on file   Social History Narrative   Patient lives at home with her grandson.   Caffeine Use: none     PHYSICAL EXAM  Filed Vitals:   10/25/14 1407  BP: 163/95  Pulse: 91  Height: 5\' 1"  (1.549 m)  Weight: 126 lb 12.8 oz (57.516 kg)    Not recorded    Body mass index is 23.97 kg/(m^2).  GENERAL EXAM: Patient is in no distress; well developed, nourished and groomed; neck is supple  CARDIOVASCULAR: Regular rate and rhythm, no murmurs, no carotid bruits  NEUROLOGIC: MENTAL STATUS: awake, alert, oriented to person, place and time, recent and remote memory intact, normal attention and concentration, language fluent, comprehension intact, naming intact, fund of knowledge appropriate; EXCEPT MMSE 27/30. POSITIVE SNOUT, MYERSONS REFLEXES. PRESSURED SPEECH.  CRANIAL NERVE: pupils equal and reactive to light, visual fields full to confrontation, extraocular muscles intact, no  nystagmus, facial sensation and strength symmetric, hearing intact, palate elevates symmetrically, uvula midline, shoulder shrug symmetric, tongue midline. MOTOR: normal bulk and tone, full strength in the BUE; BLE 4 AND LIMITED BY PAIN. SENSORY: normal and symmetric to light touch, pinprick, temperature, vibration COORDINATION: finger-nose-finger, fine finger movements normal REFLEXES: BUE 1, BLE TRACE GAIT/STATION: STOOPED POSTURE. SCOLIOSIS. ANTALGIC GAIT.    DIAGNOSTIC DATA (LABS, IMAGING, TESTING) - I reviewed patient records, labs, notes, testing and imaging myself where available.  Lab Results  Component Value Date   WBC 13.3* 01/26/2014   HGB 14.7 01/26/2014   HCT 42.9 01/26/2014   MCV 82.7 01/26/2014   PLT 366 01/26/2014      Component Value Date/Time   NA 141 01/26/2014 1715   K 3.2* 01/26/2014 1715   CL 103 01/26/2014 1715   CO2  24 01/26/2014 1715   GLUCOSE 108* 01/26/2014 1715   BUN 19 01/26/2014 1715   CREATININE 0.63 01/26/2014 1715   CALCIUM 9.9 01/26/2014 1715   PROT 7.0 01/26/2014 1715   ALBUMIN 3.6 01/26/2014 1715   AST 18 01/26/2014 1715   ALT 12 01/26/2014 1715   ALKPHOS 123* 01/26/2014 1715   BILITOT 0.5 01/26/2014 1715   GFRNONAA 83* 01/26/2014 1715   GFRAA >90 01/26/2014 1715   Lab Results  Component Value Date   CHOL 186 02/18/2012   HDL 70.00 02/18/2012   LDLCALC 104* 02/18/2012   TRIG 59.0 02/18/2012   CHOLHDL 3 02/18/2012   Lab Results  Component Value Date   HGBA1C 6.2* 10/12/2014   Lab Results  Component Value Date   XKPVVZSM27 078 10/12/2014   Lab Results  Component Value Date   TSH 1.740 10/12/2014    I reviewed images myself and agree with interpretation. -VRP  01/27/14 CT head - mild-mod perisylvian atrophy, mild chronic small vessel ischemic disease    ASSESSMENT AND PLAN  80 y.o. year old female here with reported progressive short-term memory problems, paranoia, behavior change, getting loss, confusion over past 1 year per grandson, but not  noted by patient's daughter (whom I spoke with over the phone). Patient and daughter claim that grandson was fabricating stories about patient's supposed confusion. The real story is unclear. Audiologist also noted some confusion during hearing testing suspicious for dementia. An underlying neurodegenerative disorder such as Alzheimer's disease or dementia with lewy bodies cannot be totally excluded.  PLAN: - I asked patient and daughter (via phone) to monitor symptoms and follow safety precautions - patient should stop driving (due to cognitive impairment, physical pain, psychomotor movements, getting lost) - follow up with PCP  Return if symptoms worsen or fail to improve, for return to PCP.    Penni Bombard, MD 67/54/4920, 1:00 PM Certified in Neurology, Neurophysiology and Neuroimaging  [facility] [address] [place], [place] [address] [phone]

## 2014-10-25 NOTE — Patient Instructions (Signed)
Follow up with Dr. Helane Rima or other PCP.

## 2014-12-13 ENCOUNTER — Other Ambulatory Visit: Payer: Self-pay | Admitting: Diagnostic Neuroimaging

## 2014-12-14 NOTE — Telephone Encounter (Signed)
Patient states PCP says they will not agree to fill Aricept Rx.  Okay to send refill?  Thank you.

## 2014-12-14 NOTE — Telephone Encounter (Signed)
I spoke with patient who says PCP id prescribing this med.  Asked that we disregard this request.

## 2014-12-14 NOTE — Telephone Encounter (Signed)
Patient called again and stated PCP will not prescribe Rx donepezil (ARICEPT) 5 MG tablet.  Please call and advise.

## 2015-01-16 ENCOUNTER — Other Ambulatory Visit: Payer: Self-pay | Admitting: Diagnostic Neuroimaging

## 2015-01-16 NOTE — Telephone Encounter (Signed)
Dr Mamie Nick authorized this in 12/15

## 2015-04-19 ENCOUNTER — Encounter (HOSPITAL_COMMUNITY): Payer: Self-pay | Admitting: *Deleted

## 2015-04-19 ENCOUNTER — Emergency Department (HOSPITAL_COMMUNITY): Payer: Medicare Other

## 2015-04-19 ENCOUNTER — Emergency Department (HOSPITAL_COMMUNITY)
Admission: EM | Admit: 2015-04-19 | Discharge: 2015-04-19 | Disposition: A | Discharge status: Home / Self Care | Payer: Medicare Other | Attending: Emergency Medicine | Admitting: Emergency Medicine

## 2015-04-19 DIAGNOSIS — Z8701 Personal history of pneumonia (recurrent): Secondary | ICD-10-CM | POA: Diagnosis not present

## 2015-04-19 DIAGNOSIS — Z79899 Other long term (current) drug therapy: Secondary | ICD-10-CM | POA: Diagnosis not present

## 2015-04-19 DIAGNOSIS — Z880 Allergy status to penicillin: Secondary | ICD-10-CM | POA: Diagnosis not present

## 2015-04-19 DIAGNOSIS — Z7982 Long term (current) use of aspirin: Secondary | ICD-10-CM | POA: Diagnosis not present

## 2015-04-19 DIAGNOSIS — Y92009 Unspecified place in unspecified non-institutional (private) residence as the place of occurrence of the external cause: Secondary | ICD-10-CM | POA: Insufficient documentation

## 2015-04-19 DIAGNOSIS — S59912A Unspecified injury of left forearm, initial encounter: Secondary | ICD-10-CM | POA: Diagnosis not present

## 2015-04-19 DIAGNOSIS — Z87891 Personal history of nicotine dependence: Secondary | ICD-10-CM | POA: Insufficient documentation

## 2015-04-19 DIAGNOSIS — S4992XA Unspecified injury of left shoulder and upper arm, initial encounter: Secondary | ICD-10-CM | POA: Diagnosis present

## 2015-04-19 DIAGNOSIS — M1990 Unspecified osteoarthritis, unspecified site: Secondary | ICD-10-CM | POA: Insufficient documentation

## 2015-04-19 DIAGNOSIS — Z8639 Personal history of other endocrine, nutritional and metabolic disease: Secondary | ICD-10-CM | POA: Diagnosis not present

## 2015-04-19 DIAGNOSIS — S42232A 3-part fracture of surgical neck of left humerus, initial encounter for closed fracture: Secondary | ICD-10-CM | POA: Diagnosis not present

## 2015-04-19 DIAGNOSIS — S29092A Other injury of muscle and tendon of back wall of thorax, initial encounter: Secondary | ICD-10-CM | POA: Insufficient documentation

## 2015-04-19 DIAGNOSIS — Y998 Other external cause status: Secondary | ICD-10-CM | POA: Diagnosis not present

## 2015-04-19 DIAGNOSIS — Y9389 Activity, other specified: Secondary | ICD-10-CM | POA: Insufficient documentation

## 2015-04-19 DIAGNOSIS — G894 Chronic pain syndrome: Secondary | ICD-10-CM | POA: Insufficient documentation

## 2015-04-19 DIAGNOSIS — W010XXA Fall on same level from slipping, tripping and stumbling without subsequent striking against object, initial encounter: Secondary | ICD-10-CM | POA: Diagnosis not present

## 2015-04-19 DIAGNOSIS — I10 Essential (primary) hypertension: Secondary | ICD-10-CM | POA: Diagnosis not present

## 2015-04-19 DIAGNOSIS — W19XXXA Unspecified fall, initial encounter: Secondary | ICD-10-CM

## 2015-04-19 DIAGNOSIS — S42212A Unspecified displaced fracture of surgical neck of left humerus, initial encounter for closed fracture: Secondary | ICD-10-CM

## 2015-04-19 DIAGNOSIS — S199XXA Unspecified injury of neck, initial encounter: Secondary | ICD-10-CM | POA: Diagnosis not present

## 2015-04-19 LAB — BASIC METABOLIC PANEL
Anion gap: 9 (ref 5–15)
BUN: 21 mg/dL (ref 6–23)
CO2: 28 mmol/L (ref 19–32)
Calcium: 9.3 mg/dL (ref 8.4–10.5)
Chloride: 102 mmol/L (ref 96–112)
Creatinine, Ser: 0.71 mg/dL (ref 0.50–1.10)
GFR calc Af Amer: >90 mL/min (ref >90)
GFR calc non Af Amer: 79 mL/min — ABNORMAL LOW (ref >90)
GLUCOSE: 125 mg/dL — AB (ref 70–99)
Potassium: 4.1 mmol/L (ref 3.5–5.1)
SODIUM: 139 mmol/L (ref 135–145)

## 2015-04-19 LAB — CBC WITH DIFFERENTIAL/PLATELET
Basophils Absolute: 0 10*3/uL (ref 0.0–0.1)
Basophils Relative: 0 % (ref 0–1)
Eosinophils Absolute: 0 10*3/uL (ref 0.0–0.7)
Eosinophils Relative: 0 % (ref 0–5)
HCT: 41.4 % (ref 36.0–46.0)
HEMOGLOBIN: 13.7 g/dL (ref 12.0–15.0)
LYMPHS ABS: 1.5 10*3/uL (ref 0.7–4.0)
LYMPHS PCT: 9 % — AB (ref 12–46)
MCH: 28.8 pg (ref 26.0–34.0)
MCHC: 33.1 g/dL (ref 30.0–36.0)
MCV: 87 fL (ref 78.0–100.0)
MONO ABS: 1.2 10*3/uL — AB (ref 0.1–1.0)
Monocytes Relative: 7 % (ref 3–12)
Neutro Abs: 13.2 10*3/uL — ABNORMAL HIGH (ref 1.7–7.7)
Neutrophils Relative %: 84 % — ABNORMAL HIGH (ref 43–77)
PLATELETS: 299 10*3/uL (ref 150–400)
RBC: 4.76 MIL/uL (ref 3.87–5.11)
RDW: 12.9 % (ref 11.5–15.5)
WBC: 16 10*3/uL — ABNORMAL HIGH (ref 4.0–10.5)

## 2015-04-19 MED ORDER — HYDROMORPHONE HCL 1 MG/ML IJ SOLN
1.0000 mg | Freq: Once | INTRAMUSCULAR | Status: AC
Start: 2015-04-19 — End: 2015-04-19
  Administered 2015-04-19: 1 mg via INTRAVENOUS
  Filled 2015-04-19: qty 1

## 2015-04-19 NOTE — Progress Notes (Signed)
EDCM spoke to patient and her daughter [name] at bedside.  Patient lives at home.  Per Caryl Asp, patient's grandson and his girlfriend were living with patient and then she kicked them out.  After six months, they are now back at patient's home, "As soon as they moved back in, she got all loopy again and she fell.  She was completely fine when they weren't there."  [facility], consulted EDSW to speak to patient and her daughter regarding social issues.  Patient reports she is not currently receiving any home health services and has never had hh services.  This Walker arrange home health services in 2015 but patient reports, "I didn't need them."  Patient reports she is able to ambulate without difficulty and perform her ADL's on her own.  Patient reports she only uses a walker to ambulate, "Only when it's completely necessary."  Patient also has canes at home and a shower chair.  Patient unable to tell [facility] who her pcp is but daughter confirms she has one.  Patient politely refused home health services at this time.  EDCM provided patient's daughter with list of home health agencies in [facility].  [facility] informed patient that if she feels she needs home health in the future, her pcp may arrange this for her.  No further EDCM needs at this time.

## 2015-04-19 NOTE — Progress Notes (Signed)
CSW attempted to meet with pt at bedside. However, nurse was conducting procedure at bedside.  Also, nurse informed CSW that pt is being transported to xray. She informed CSW that there may be family/social issues regarding the pt and her POA information.  CSW will check back in with pt once they return.  Willette Brace [phone] ED CSW 04/19/2015 8:42 PM

## 2015-04-19 NOTE — ED Notes (Signed)
Pt states she was at home, she says she remembers getting up and saying "don't do it, don't do it" as in don't fall and she noticed she was falling. She is alert and oriented x 4. She admits to not knowing if she fell over the cat. She denies LOC.

## 2015-04-19 NOTE — ED Notes (Signed)
Bed: WA04 Expected date:  Expected time:  Means of arrival:  Comments: fall

## 2015-04-19 NOTE — Progress Notes (Signed)
CSW met with pt at bedside. There was no family present. Patient confirms that she comes from home. She states that her grandson is currently living with her in [place].  Patient informed CSW that she presents to [facility] due to falling. Patient does not know what caused her fall. Patient stated " I just fell."  Patient informed CSW that she does fall often.   Patient states prior to coming into [facility] she has been able to complete her ADL's independently.  Patient states that she is not interested in a facility. Patient stated " Im too independent." Also, patient informed CSW that she is not interested in receiving home health services.  CSW asked pt about her POA. Patient initially stated that her friend Ivin Booty is her POA. However, later she stated that her POA was her grandson and that she wanted to change it. Later, patient stated she did not want a POA at all.    Later during the night, CSW went to revisit the patient. Patient informed CSW that she felt that her daughter would be helpful with her care while at home. CSW informed grandson that the pt thought that her daughter may be able to help while at home. [name] states that he pt's daughter is not welcomed at the house.   Willette Brace [phone] ED CSW 04/19/2015 10:52 PM

## 2015-04-19 NOTE — ED Notes (Addendum)
Late Entry: family at bedside, pt daughter and grandmother. Apparently with a  Family dynamic of legal caregiver. Social worker was consulted and discussed concerns with patient.  Pt and grandson at bedside currently. Daughter left [phone] to pick up fiance.

## 2015-04-19 NOTE — ED Provider Notes (Signed)
CSN: [id number]     Arrival date & time 04/19/15  1847 History   First MD Initiated Contact with Patient 04/19/15 1925     Chief Complaint  Patient presents with  . Fall     (Consider location/radiation/quality/duration/timing/severity/associated sxs/prior Treatment) HPI  81 year old female presents with a fall at home. She cannot exactly why or how she fell but she states she did not hit her head, lose consciousness, become dizzy, get chest pain, or lightheaded. She also does not think she slipped or her legs get weak. She just states "I don't know" when asked multiple different ways why she fell. She is complaining of neck pain, back pain, and left arm pain. She has a small abrasion to her left knee but states there is no pain. Chronically on oxycodone and fentanyl patch for pain. Rates her pain as severe.  Past Medical History  Diagnosis Date  . Osteoarthritis   . Osteoporosis   . Pneumonia   . Chronic pain disorder     with narcotic management  . Hyperlipidemia   . Hypertension   . Chronic back pain     with degenerative joint disease  . Chronic back pain    Past Surgical History  Procedure Laterality Date  . Appendectomy    . Abdominal hysterectomy    . Tonsillectomy     Family History  Problem Relation Age of Onset  . Heart Problems Mother    History  Substance Use Topics  . Smoking status: Former Smoker -- 13 years    Quit date: 04/15/1976  . Smokeless tobacco: Never Used  . Alcohol Use: No   OB History    No data available     Review of Systems  Respiratory: Negative for shortness of breath.   Cardiovascular: Negative for chest pain.  Gastrointestinal: Negative for vomiting and abdominal pain.  Genitourinary: Negative for dysuria.  Musculoskeletal: Positive for back pain, arthralgias and neck pain.  Neurological: Negative for syncope and headaches.  All other systems reviewed and are negative.     Allergies  Amoxicillin-pot clavulanate and  Sulfonamide derivatives  Home Medications   Prior to Admission medications   Medication Sig Start Date End Date Taking? Authorizing Provider  amphetamine-dextroamphetamine (ADDERALL XR) 30 MG 24 hr capsule Take 30 mg by mouth as needed (only when driving .).     Historical Provider, MD  aspirin EC 81 MG tablet Take 81 mg by mouth daily.    Historical Provider, MD  BIOTIN PO Take 1 tablet by mouth daily.    Historical Provider, MD  donepezil (ARICEPT) 5 MG tablet TAKE 1 TABLET (5 MG TOTAL) BY MOUTH AT BEDTIME. 12/14/14   Vikram R Penumalli, MD  donepezil (ARICEPT) 5 MG tablet TAKE 1 TABLET (5 MG TOTAL) BY MOUTH AT BEDTIME. 01/16/15   Penni Bombard, MD  fentaNYL (DURAGESIC) 100 MCG/HR Place 100 mcg onto the skin every 3 (three) days. Dr. Nelva Bush     Historical Provider, MD  HYDROcodone-acetaminophen (NORCO/VICODIN) 5-325 MG per tablet Take 1 tablet by mouth every 4 (four) hours as needed for moderate pain.    Historical Provider, MD  Multiple Vitamin (MULTIVITAMIN) tablet Take 1 tablet by mouth daily.    Historical Provider, MD  oxycodone (ROXICODONE) 30 MG immediate release tablet Take 30 mg by mouth 2 (two) times daily as needed for pain.    Historical Provider, MD  SILENOR 3 MG TABS Take 3 mg by mouth at bedtime as needed (insomnia).  01/04/14  Historical Provider, MD   BP 147/74 mmHg  Pulse 85  Temp(Src) 98.1 F (36.7 C) (Oral)  Resp 17  SpO2 99% Physical Exam  Constitutional: She is oriented to person, place, and time. She appears well-developed and well-nourished. Cervical collar in place.  HENT:  Head: Normocephalic and atraumatic.  Right Ear: External ear normal.  Left Ear: External ear normal.  Nose: Nose normal.  Eyes: Right eye exhibits no discharge. Left eye exhibits no discharge.  Neck: Neck supple. Muscular tenderness present.  Cardiovascular: Normal rate, regular rhythm and normal heart sounds.   Pulses:      Radial pulses are 2+ on the right side, and 2+ on the left  side.  Pulmonary/Chest: Effort normal and breath sounds normal.  Abdominal: Soft. She exhibits no distension. There is no tenderness.  Musculoskeletal:       Left elbow: She exhibits normal range of motion. No tenderness found.       Thoracic back: She exhibits tenderness (mild).       Lumbar back: She exhibits no tenderness.       Left upper arm: She exhibits tenderness. She exhibits no swelling.       Left forearm: She exhibits tenderness. She exhibits no swelling.  Neurological: She is alert and oriented to person, place, and time.  Skin: Skin is warm and dry.  Vitals reviewed.   ED Course  Procedures (including critical care time) Labs Review Labs Reviewed  BASIC METABOLIC PANEL - Abnormal; Notable for the following:    Glucose, Bld 125 (*)    GFR calc non Af Amer 79 (*)    All other components within normal limits  CBC WITH DIFFERENTIAL/PLATELET - Abnormal; Notable for the following:    WBC 16.0 (*)    Neutrophils Relative % 84 (*)    Neutro Abs 13.2 (*)    Lymphocytes Relative 9 (*)    Monocytes Absolute 1.2 (*)    All other components within normal limits    Imaging Review Dg Thoracic Spine 2 View  04/19/2015   CLINICAL DATA:  81 year old female status post fall in her donning room today. Altered mental status. Left proximal humerus fracture.  EXAM: THORACIC SPINE - 2 VIEW  COMPARISON:  Prior chest x-ray 01/26/2014  FINDINGS: Marked levo convex and rotary scoliosis of the mid lumbar spine. The degree of scoliotic curvature is significantly limits the lateral view. There is no compelling evidence of acute fracture. Compression deformity of the T11 vertebral body appears unchanged dating back to January of 2015.  IMPRESSION: 1. Advanced levoconvex and rotary scoliosis of the lumbar spine centered at L1-L2. 2. No definite acute fracture. 3. Chronic T11 fracture.   Electronically Signed   By: Jacqulynn Cadet M.D.   On: 04/19/2015 21:13   Dg Forearm Left  04/19/2015   CLINICAL  DATA:  Fall.  EXAM: LEFT FOREARM - 2 VIEW  COMPARISON:  06/18/2005 wrist radiographs  FINDINGS: No displaced fracture or aggressive osseous lesion of the left radius and ulna. Forearm radiographs have limited sensitivity for joint pathology detection though no overt dislocation.  IMPRESSION: No acute or aggressive osseous finding of the left forearm.   Electronically Signed   By: Carlos Levering M.D.   On: 04/19/2015 21:13   Ct Head Wo Contrast  04/19/2015   CLINICAL DATA:  [place] 2 hours ago in the dieting room. Neck and left arm pain.  EXAM: CT HEAD WITHOUT CONTRAST  CT CERVICAL SPINE WITHOUT CONTRAST  TECHNIQUE: Multidetector CT imaging  of the head and cervical spine was performed following the standard protocol without intravenous contrast. Multiplanar CT image reconstructions of the cervical spine were also generated.  COMPARISON:  01/26/2014  FINDINGS: CT HEAD FINDINGS  There is mild generalized brain atrophy. No evidence of old or acute focal infarction, mass lesion, hemorrhage, hydrocephalus or extra-axial collection. No skull fracture. No fluid in the sinuses, middle ears or mastoids.  CT CERVICAL SPINE FINDINGS  Alignment is normal. No fracture. No soft tissue swelling. No facet arthropathy. Ordinary chronic degenerative spondylosis at C4-5, C5-6 and C6-7 but without severe compromise of the canal or foramina. Multiple thyroid nodules consistent with goiter. Calcified plaque at both carotid bifurcation regions.  IMPRESSION: Head CT: Mild age related atrophy.  No focal or acute finding.  Cervical spine CT: Ordinary mild spondylosis. No acute or traumatic finding.   Electronically Signed   By: Nelson Chimes M.D.   On: 04/19/2015 20:58   Ct Cervical Spine Wo Contrast  04/19/2015   CLINICAL DATA:  [place] 2 hours ago in the dieting room. Neck and left arm pain.  EXAM: CT HEAD WITHOUT CONTRAST  CT CERVICAL SPINE WITHOUT CONTRAST  TECHNIQUE: Multidetector CT imaging of the head and cervical spine was performed  following the standard protocol without intravenous contrast. Multiplanar CT image reconstructions of the cervical spine were also generated.  COMPARISON:  01/26/2014  FINDINGS: CT HEAD FINDINGS  There is mild generalized brain atrophy. No evidence of old or acute focal infarction, mass lesion, hemorrhage, hydrocephalus or extra-axial collection. No skull fracture. No fluid in the sinuses, middle ears or mastoids.  CT CERVICAL SPINE FINDINGS  Alignment is normal. No fracture. No soft tissue swelling. No facet arthropathy. Ordinary chronic degenerative spondylosis at C4-5, C5-6 and C6-7 but without severe compromise of the canal or foramina. Multiple thyroid nodules consistent with goiter. Calcified plaque at both carotid bifurcation regions.  IMPRESSION: Head CT: Mild age related atrophy.  No focal or acute finding.  Cervical spine CT: Ordinary mild spondylosis. No acute or traumatic finding.   Electronically Signed   By: Nelson Chimes M.D.   On: 04/19/2015 20:58   Dg Humerus Left  04/19/2015   CLINICAL DATA:  81 year old female status post fall today in her dye ending room. Confused with altered mental status.  EXAM: LEFT HUMERUS - 2+ VIEW  COMPARISON:  Concurrently obtained radiographs of the left forearm  FINDINGS: Comminuted 3 part fracture through the surgical neck of the humerus. The bones are osteopenic.  IMPRESSION: Comminuted 3 part fracture through the surgical neck of the humerus. Minimal displacement.   Electronically Signed   By: Jacqulynn Cadet M.D.   On: 04/19/2015 21:11     EKG Interpretation None      MDM   Final diagnoses:  Fall, initial encounter  Humeral surgical neck fracture, left, closed, initial encounter    Patient has a history of multiple falls. Has left humerus fracture as above. Normal pulses, motor and sensation in LUE. Better with IV narcotics. Has plenty of oxycodone at home for pain. No apparent head or neck injury. Likely mechanical fall, family states she falls  often due to the narcotics. Is able to ambulate here and lives with grandson, declines help from SW for in home help such as bedside commode. D/w Dr. Ronnie Derby, will f/u in his office at 7:30 AM on 4/26    Sherwood Gambler, MD 04/20/15 [phone]

## 2015-04-19 NOTE — ED Notes (Signed)
Per EMS-felll 2 hours ago-fell in ding room-denies LOC-does not know how she fell-c/o neck and left arm pain, in [facility]-mental baseline per daughter

## 2015-05-09 ENCOUNTER — Other Ambulatory Visit: Payer: Self-pay | Admitting: Orthopedic Surgery

## 2015-05-09 ENCOUNTER — Ambulatory Visit
Admission: RE | Admit: 2015-05-09 | Discharge: 2015-05-09 | Disposition: A | Discharge status: Home / Self Care | Payer: Medicare Other | Source: Ambulatory Visit | Attending: Orthopedic Surgery | Admitting: Orthopedic Surgery

## 2015-05-09 DIAGNOSIS — M25512 Pain in left shoulder: Secondary | ICD-10-CM

## 2015-12-17 IMAGING — CR DG FOREARM 2V*L*
2 series · 2 of 2 positions shown · non-contrast
Comparison: 06/18/2005 wrist radiographs

CLINICAL DATA: Fall.

EXAM:
LEFT FOREARM - 2 VIEW

[x forearm ap left]
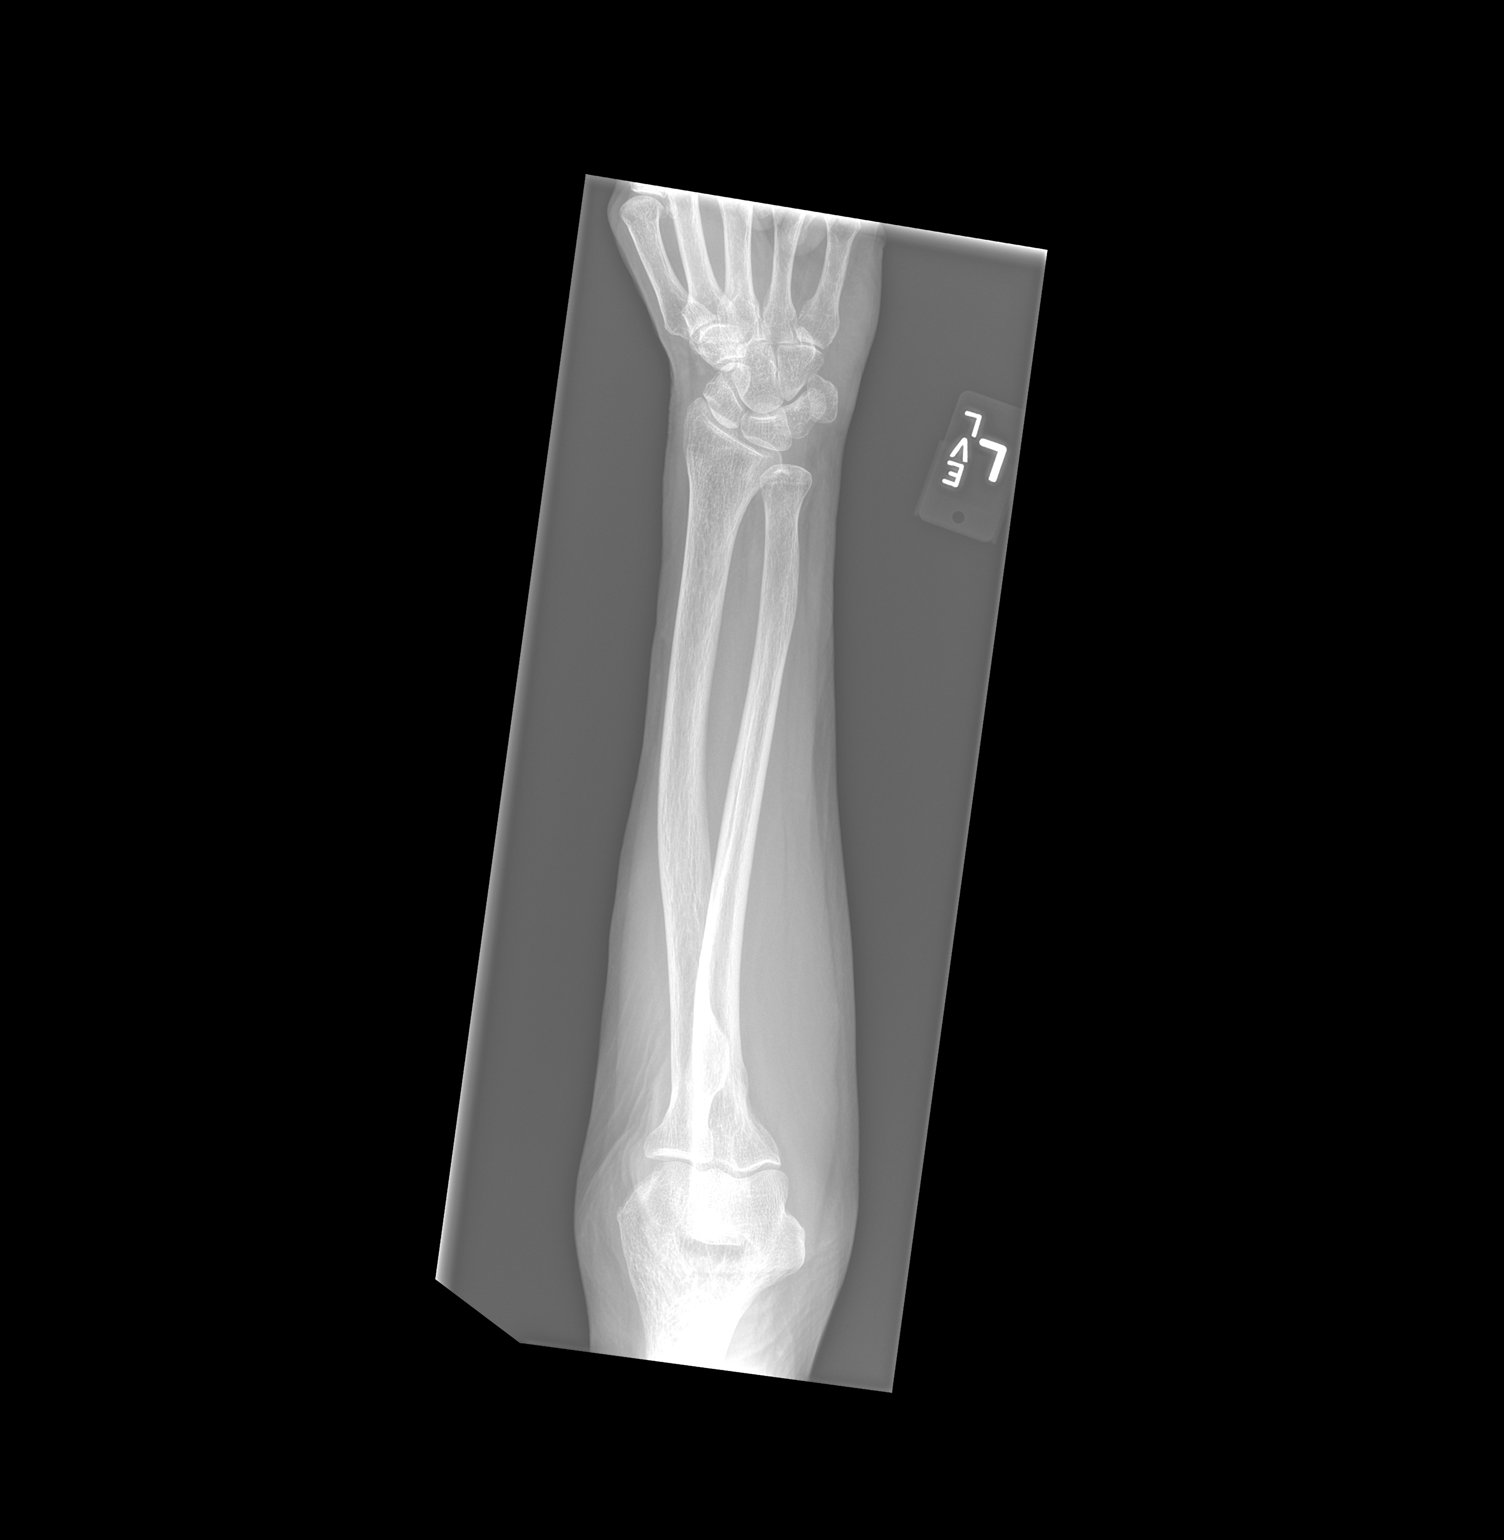

[x forearm lat left]
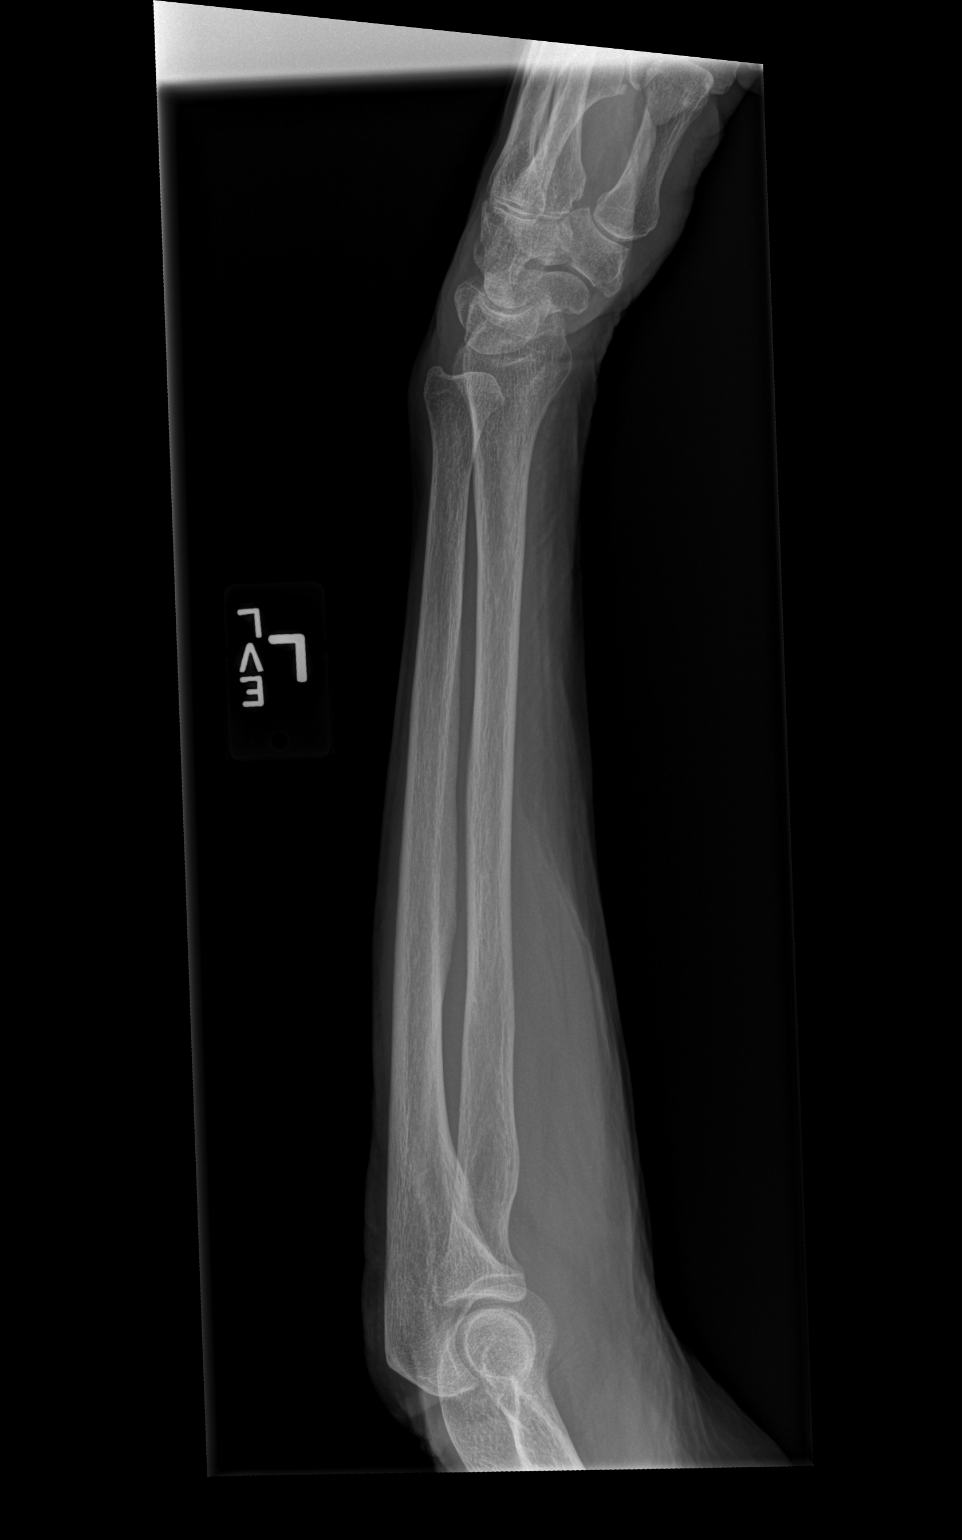

[2 of 2 positions shown; findings below may reference images not displayed]

FINDINGS: No displaced fracture or aggressive osseous lesion of the left
radius and ulna. Forearm radiographs have limited sensitivity for
joint pathology detection though no overt dislocation.
IMPRESSION: No acute or aggressive osseous finding of the left forearm.

## 2015-12-17 IMAGING — CT CT HEAD W/O CM
2 of 4 series · 14 of 30 positions shown, 17 images · non-contrast
Comparison: 01/26/2014

CLINICAL DATA: Fell 2 hours ago in the dieting room. Neck and left
arm pain.

EXAM:
CT HEAD WITHOUT CONTRAST
CT CERVICAL SPINE WITHOUT CONTRAST
TECHNIQUE: Multidetector CT imaging of the head and cervical spine was
performed following the standard protocol without intravenous
contrast. Multiplanar CT image reconstructions of the cervical spine
were also generated.

[Series 4: bone windows · axial · 0.43mm/px · z∈[-108,-24]mm · 4 of 48 slices shown]
[im 10/48  bone]
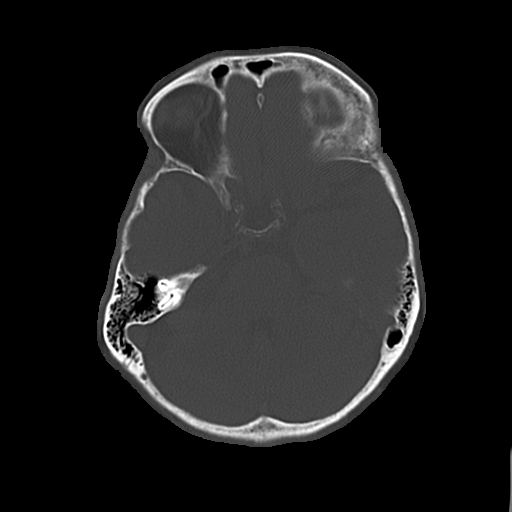
[im 19/48  bone]
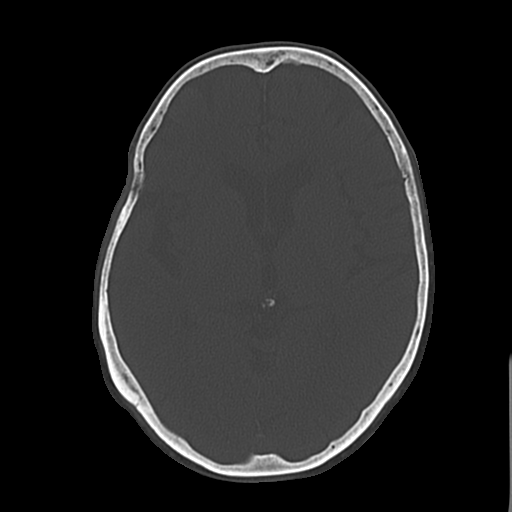
[im 29/48  bone]
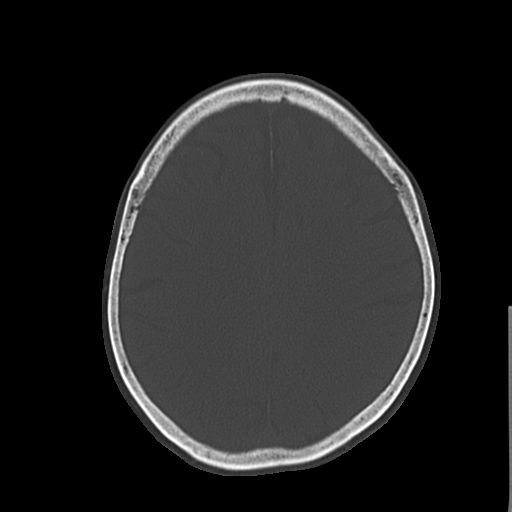
[im 38/48  bone]
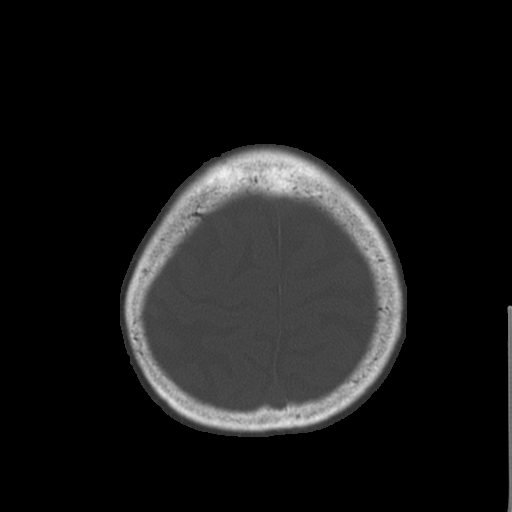

[Series 8: axial recon · axial · 0.23mm/px · z∈[-310,-160]mm · 10 of 96 slices shown, 13 images]
[im 9/96  brain]
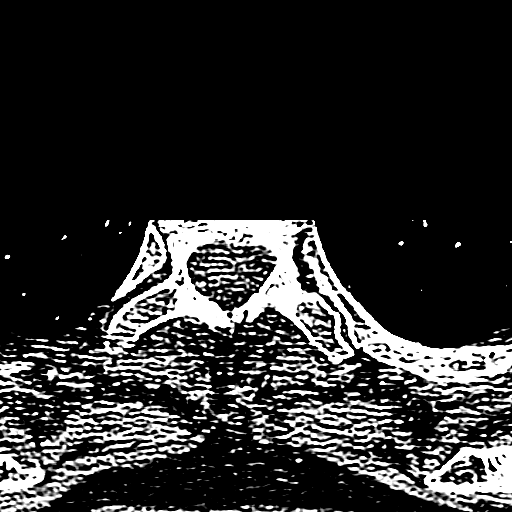
[im 9/96  bone]
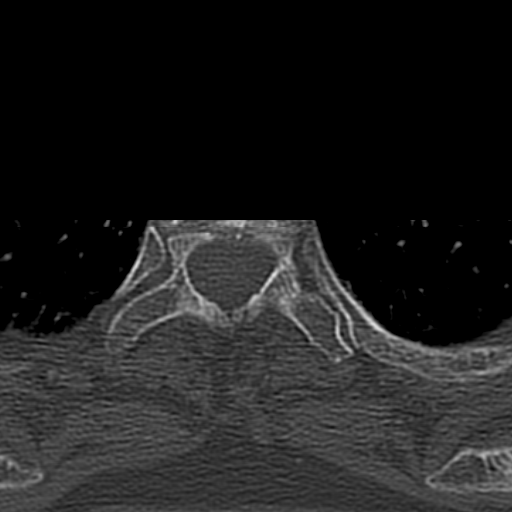
[im 18/96  brain]
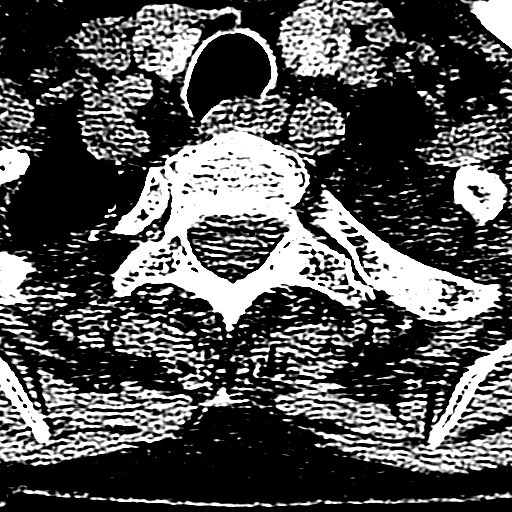
[im 26/96  brain]
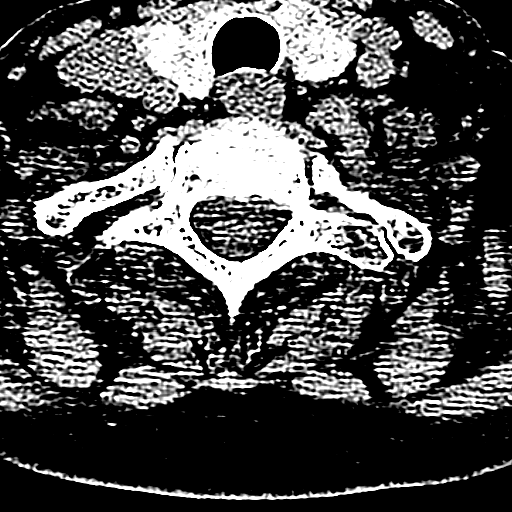
[im 35/96  brain]
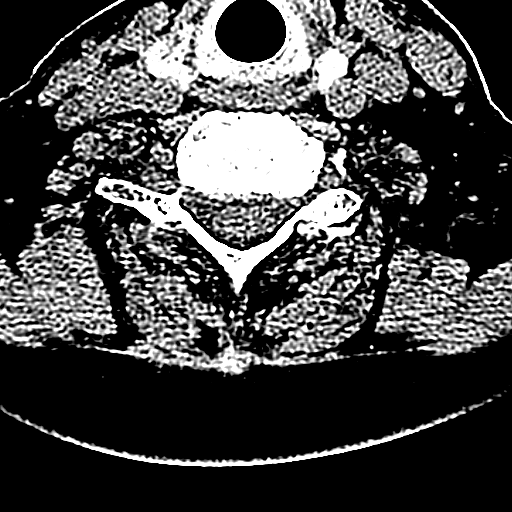
[im 44/96  brain]
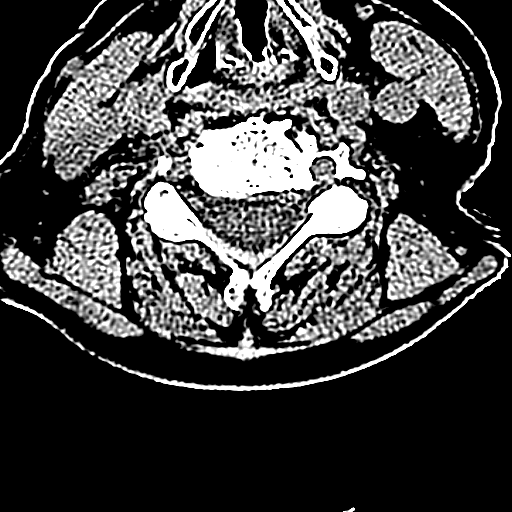
[im 44/96  bone]
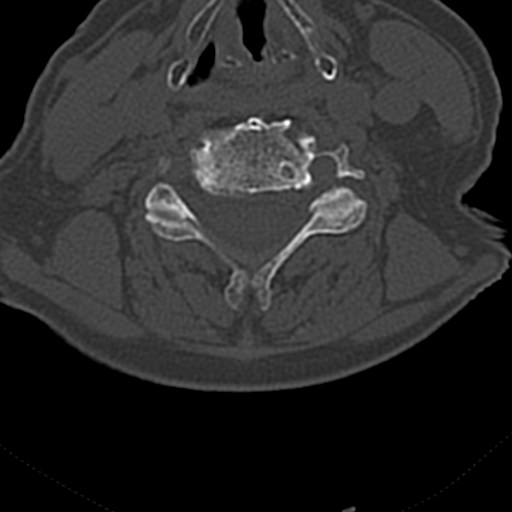
[im 52/96  brain]
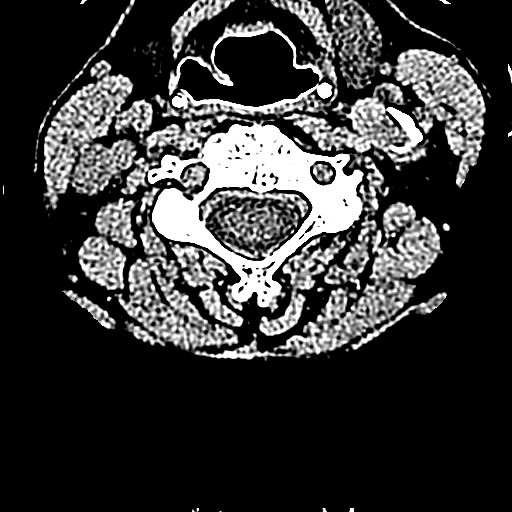
[im 61/96  brain]
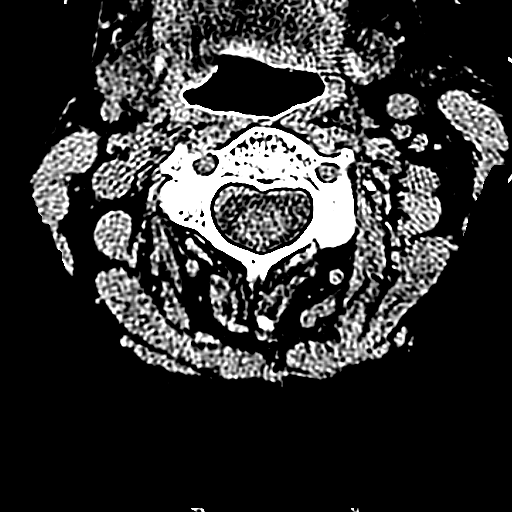
[im 70/96  brain]
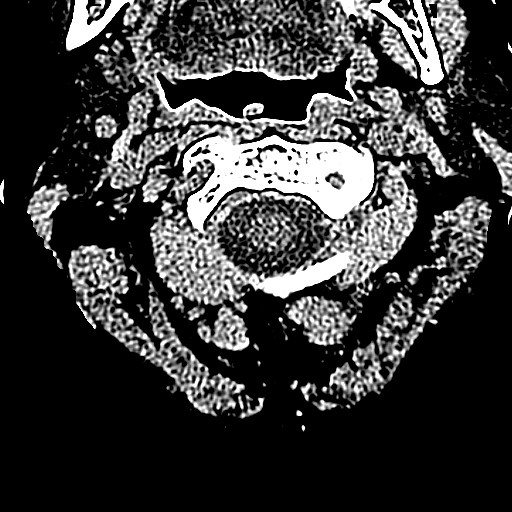
[im 78/96  brain]
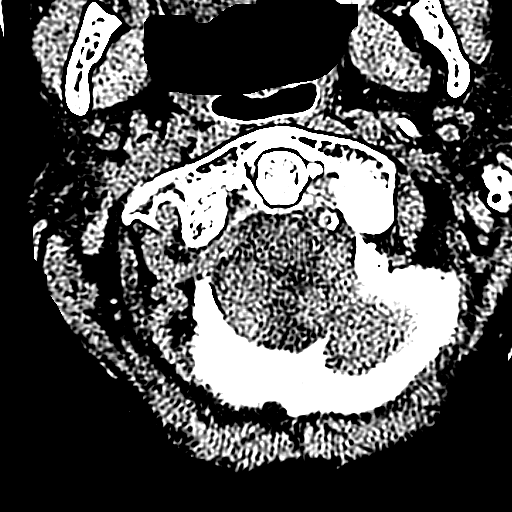
[im 78/96  bone]
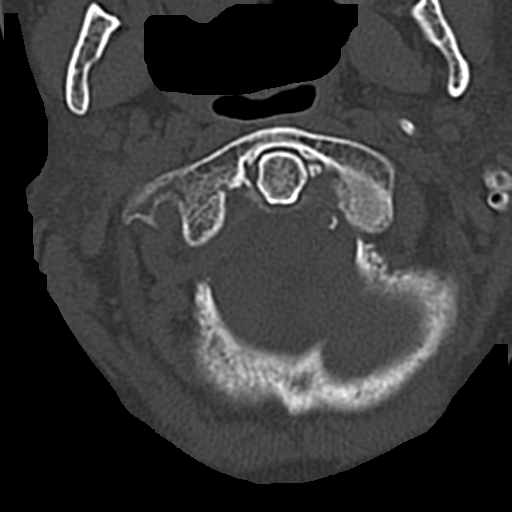
[im 87/96  brain]
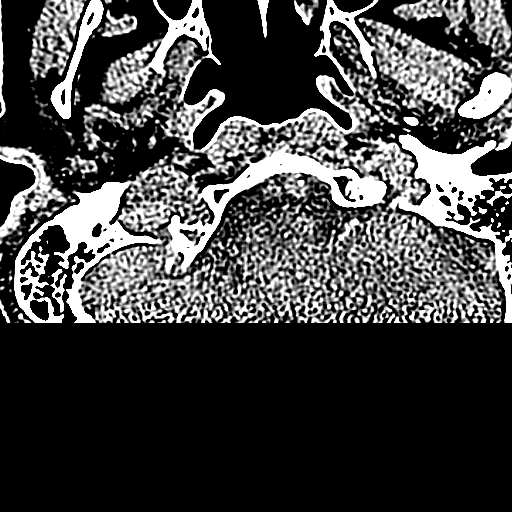

[14 of 30 positions shown; findings below may reference images not displayed]

FINDINGS: CT HEAD FINDINGS

There is mild generalized brain atrophy. No evidence of old or acute
focal infarction, mass lesion, hemorrhage, hydrocephalus or
extra-axial collection. No skull fracture. No fluid in the sinuses,
middle ears or mastoids.

CT CERVICAL SPINE FINDINGS

Alignment is normal. No fracture. No soft tissue swelling. No facet
arthropathy. Ordinary chronic degenerative spondylosis at C4-5, C5-6
and C6-7 but without severe compromise of the canal or foramina.
Multiple thyroid nodules consistent with goiter. Calcified plaque at
both carotid bifurcation regions.
IMPRESSION: Head CT: Mild age related atrophy.  No focal or acute finding.

Cervical spine CT: Ordinary mild spondylosis. No acute or traumatic
finding.

## 2015-12-17 IMAGING — CR DG THORACIC SPINE 2V
2 series · 2 of 2 positions shown · non-contrast
Comparison: Prior chest x-ray 01/26/2014

CLINICAL DATA: 83-year-old female status post fall in her donning
room today. Altered mental status. Left proximal humerus fracture.

EXAM:
THORACIC SPINE - 2 VIEW

[x thoracic spine lat]
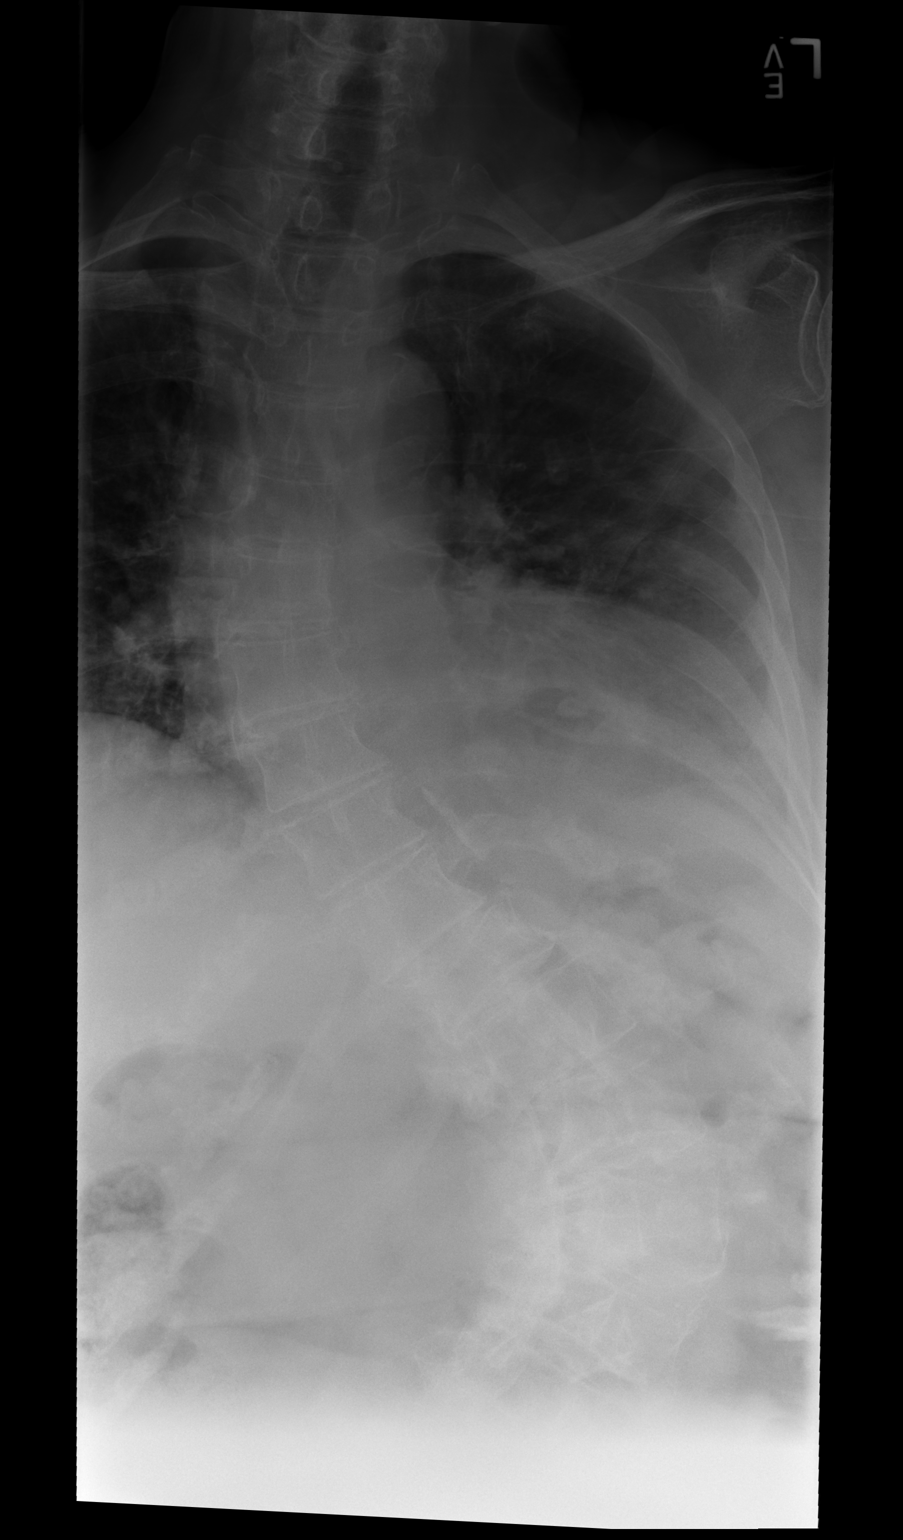

[w thoracic spine lat]
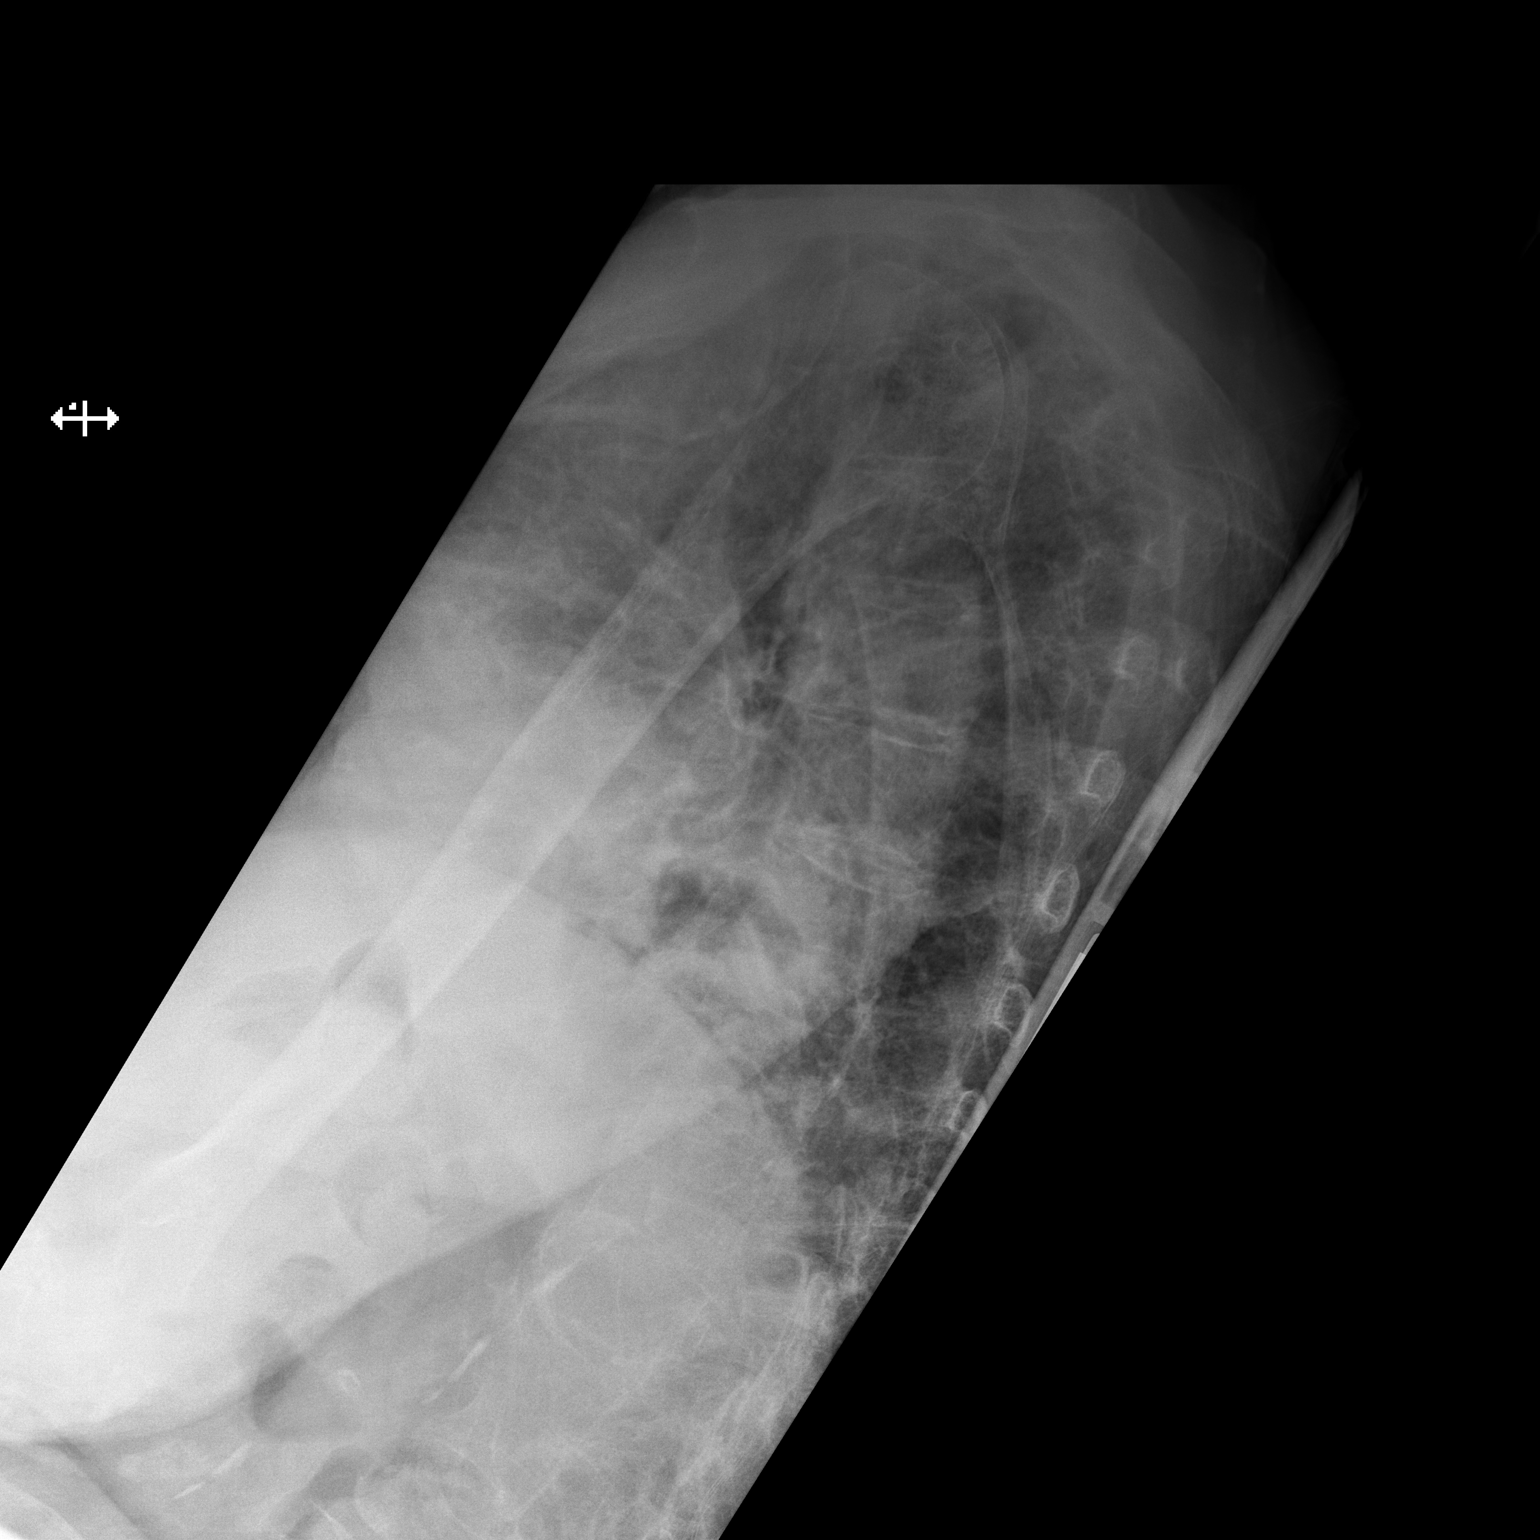

[2 of 2 positions shown; findings below may reference images not displayed]

FINDINGS: Marked levo convex and rotary scoliosis of the mid lumbar spine. The
degree of scoliotic curvature is significantly limits the lateral
view. There is no compelling evidence of acute fracture. Compression
deformity of the T11 vertebral body appears unchanged dating back to
Monday December, 2013.
IMPRESSION: 1. Advanced levoconvex and rotary scoliosis of the lumbar spine
centered at L1-L2.
2. No definite acute fracture.
3. Chronic T11 fracture.

## 2015-12-17 IMAGING — CR DG HUMERUS 2V *L*
2 series · 2 of 2 positions shown · non-contrast
Comparison: Concurrently obtained radiographs of the left forearm

CLINICAL DATA: 83-year-old female status post fall today in her dye
ending room. Confused with altered mental status.

EXAM:
LEFT HUMERUS - 2+ VIEW

[x humerus ap left (1 of 2)]
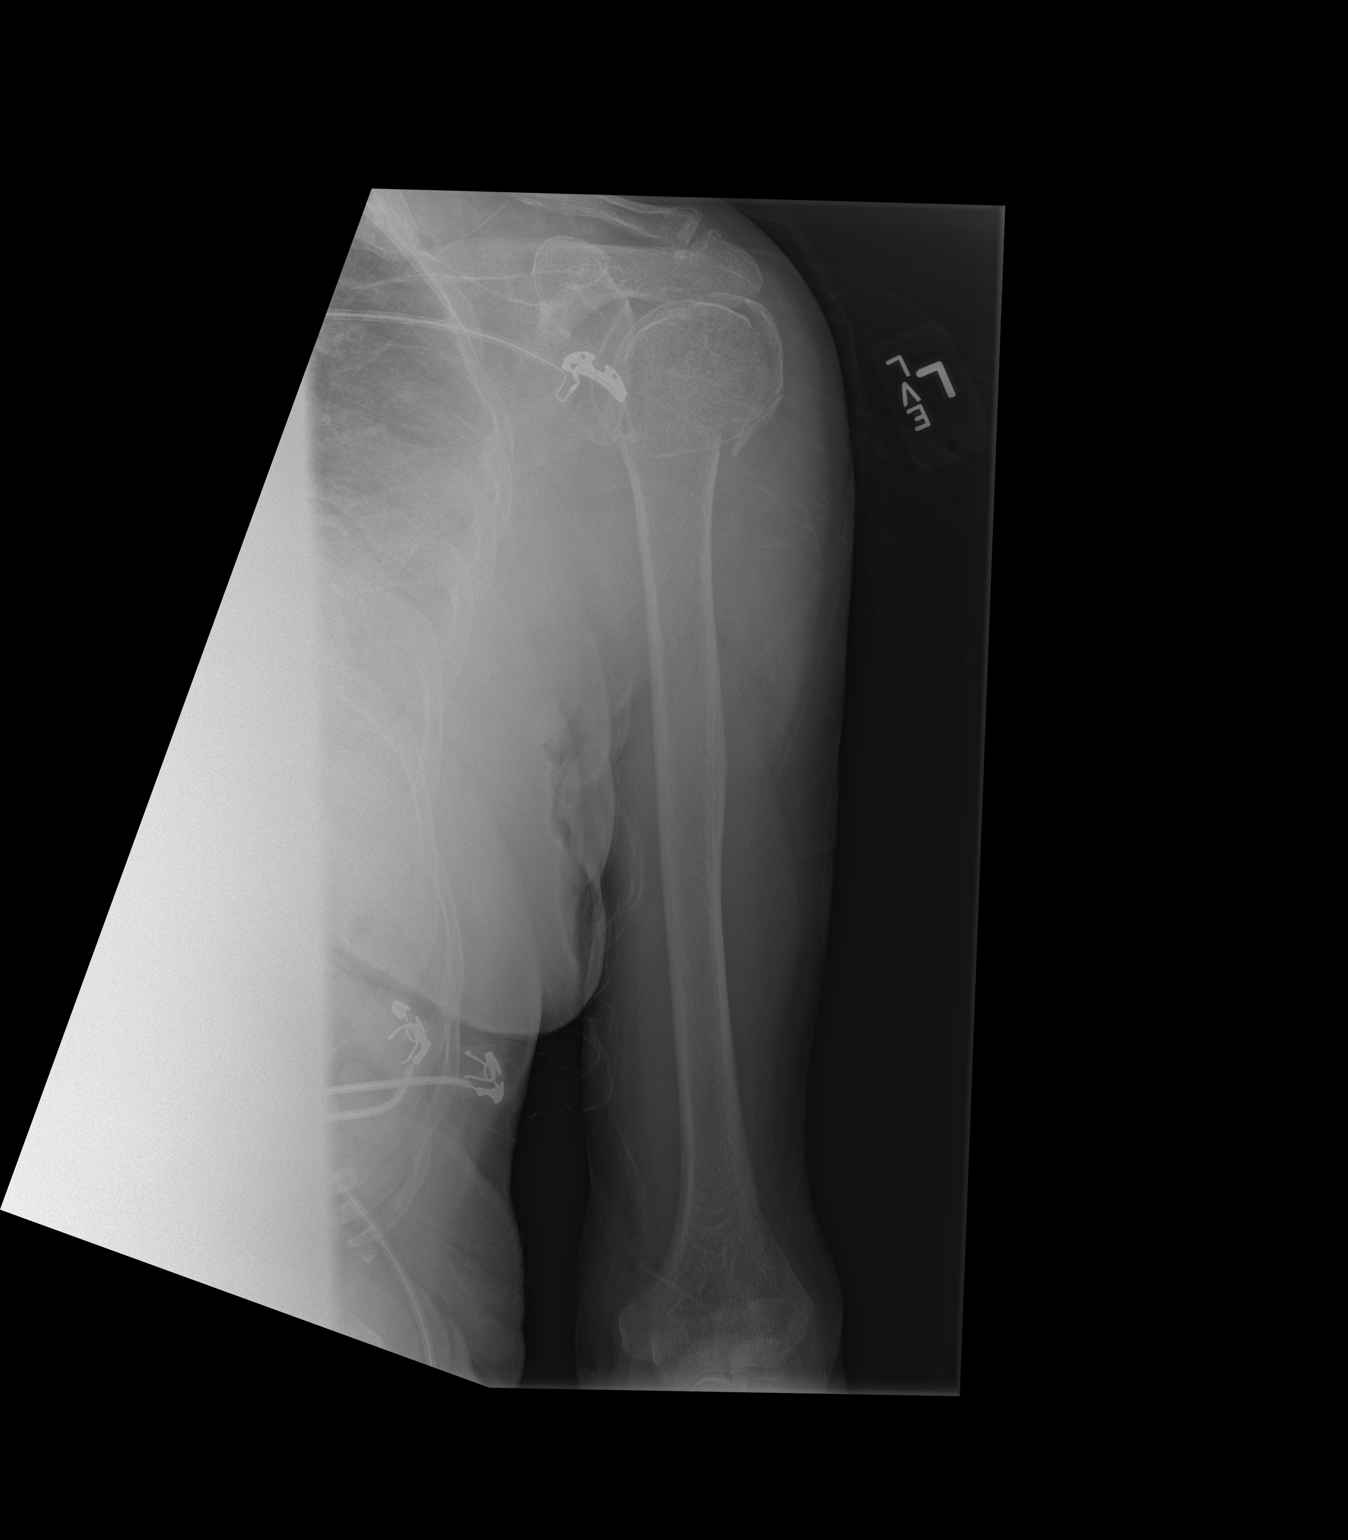

[x humerus ap left (2 of 2)]
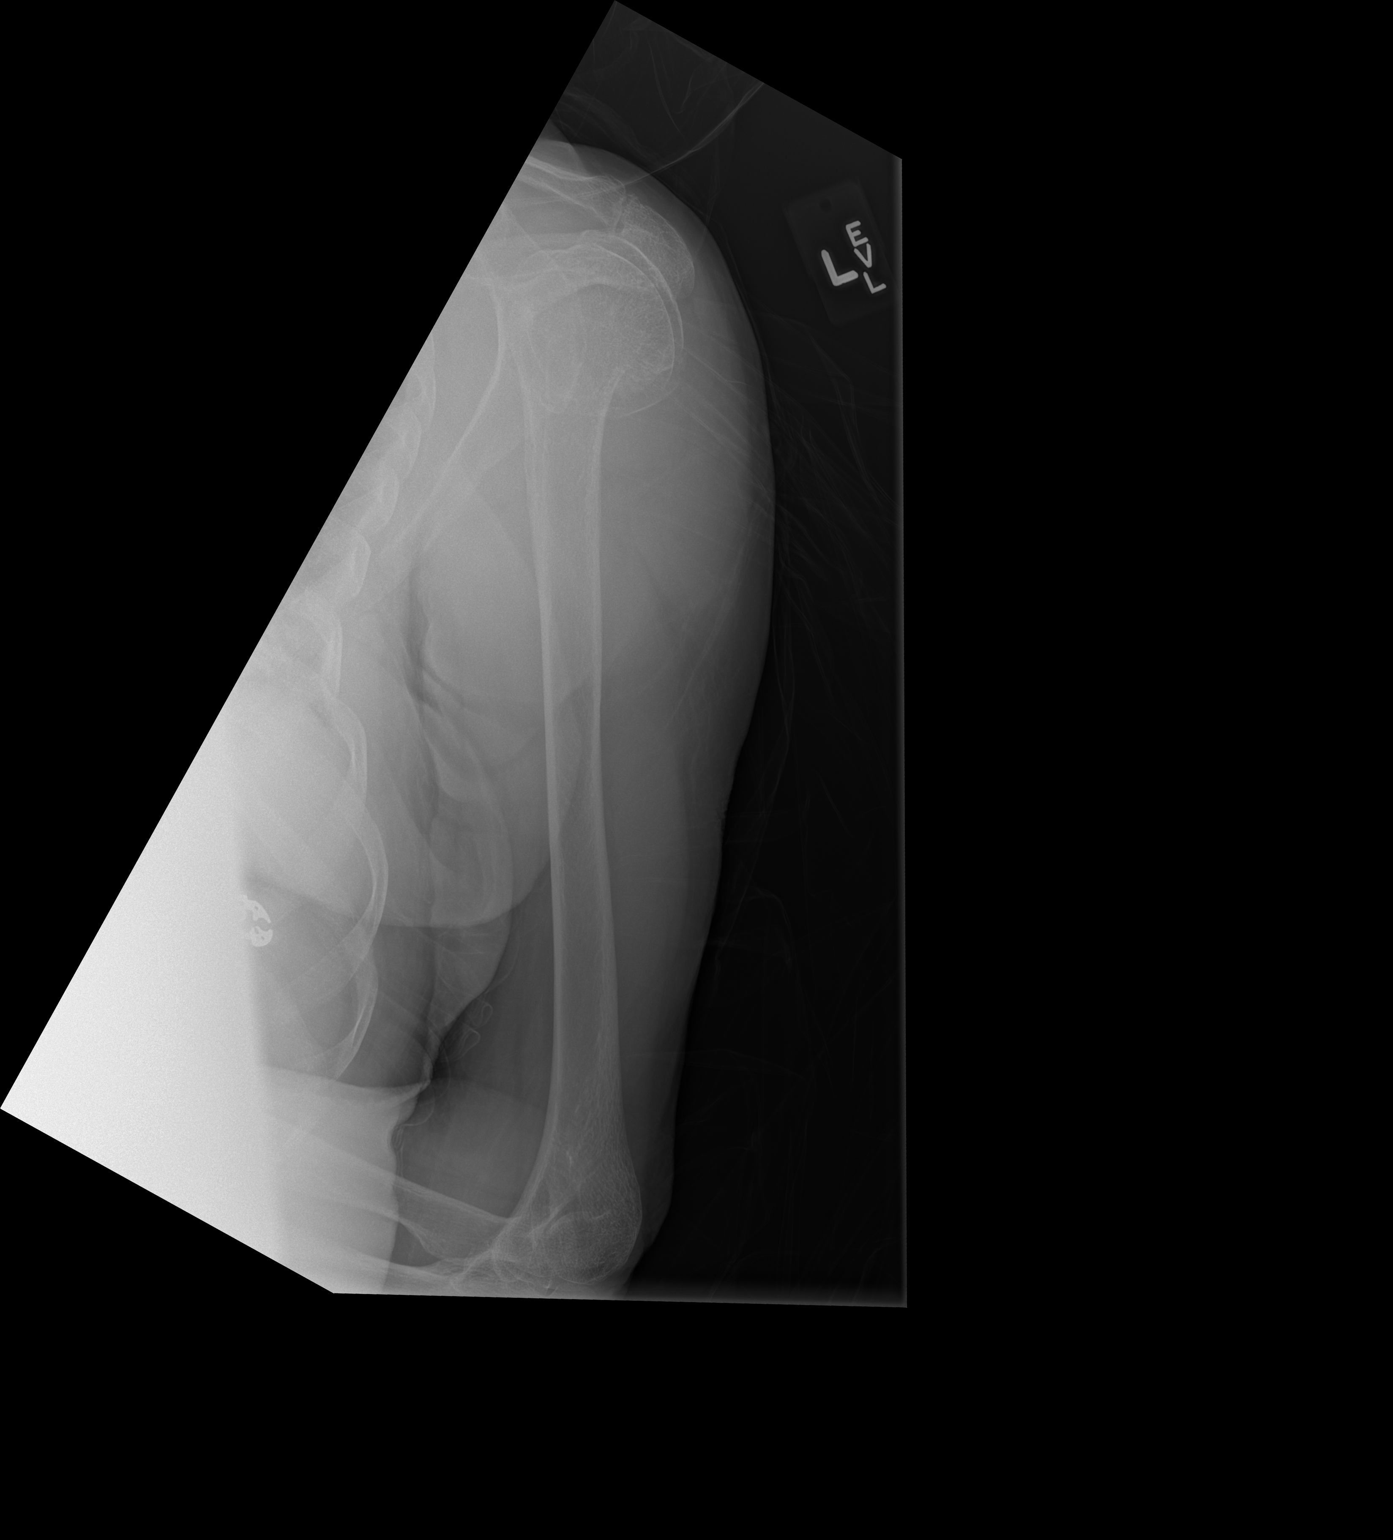

[2 of 2 positions shown; findings below may reference images not displayed]

FINDINGS: Comminuted 3 part fracture through the surgical neck of the humerus.
The bones are osteopenic.
IMPRESSION: Comminuted 3 part fracture through the surgical neck of the humerus.
Minimal displacement.

## 2015-12-24 ENCOUNTER — Emergency Department (HOSPITAL_COMMUNITY): Payer: Medicare Other

## 2015-12-24 ENCOUNTER — Encounter (HOSPITAL_COMMUNITY)
Admission: EM | Disposition: A | Discharge status: Skilled Nursing Facility | Payer: Self-pay | Source: Home / Self Care | Attending: Family Medicine

## 2015-12-24 ENCOUNTER — Emergency Department (HOSPITAL_COMMUNITY): Payer: Medicare Other | Admitting: Anesthesiology

## 2015-12-24 ENCOUNTER — Encounter (HOSPITAL_COMMUNITY): Payer: Self-pay | Admitting: Emergency Medicine

## 2015-12-24 ENCOUNTER — Inpatient Hospital Stay (HOSPITAL_COMMUNITY)
Admission: EM | Admit: 2015-12-24 | Discharge: 2015-12-27 | DRG: 470 | Disposition: A | Discharge status: Skilled Nursing Facility | Payer: Medicare Other | Attending: Internal Medicine | Admitting: Internal Medicine

## 2015-12-24 DIAGNOSIS — Z90710 Acquired absence of both cervix and uterus: Secondary | ICD-10-CM | POA: Diagnosis not present

## 2015-12-24 DIAGNOSIS — M47816 Spondylosis without myelopathy or radiculopathy, lumbar region: Secondary | ICD-10-CM | POA: Diagnosis not present

## 2015-12-24 DIAGNOSIS — W010XXA Fall on same level from slipping, tripping and stumbling without subsequent striking against object, initial encounter: Secondary | ICD-10-CM | POA: Diagnosis present

## 2015-12-24 DIAGNOSIS — S72091A Other fracture of head and neck of right femur, initial encounter for closed fracture: Secondary | ICD-10-CM | POA: Diagnosis not present

## 2015-12-24 DIAGNOSIS — Z87891 Personal history of nicotine dependence: Secondary | ICD-10-CM

## 2015-12-24 DIAGNOSIS — Z7982 Long term (current) use of aspirin: Secondary | ICD-10-CM | POA: Diagnosis not present

## 2015-12-24 DIAGNOSIS — M5136 Other intervertebral disc degeneration, lumbar region: Secondary | ICD-10-CM | POA: Diagnosis present

## 2015-12-24 DIAGNOSIS — Z79891 Long term (current) use of opiate analgesic: Secondary | ICD-10-CM | POA: Diagnosis not present

## 2015-12-24 DIAGNOSIS — S72001S Fracture of unspecified part of neck of right femur, sequela: Secondary | ICD-10-CM | POA: Diagnosis not present

## 2015-12-24 DIAGNOSIS — M810 Age-related osteoporosis without current pathological fracture: Secondary | ICD-10-CM | POA: Diagnosis present

## 2015-12-24 DIAGNOSIS — Z86711 Personal history of pulmonary embolism: Secondary | ICD-10-CM

## 2015-12-24 DIAGNOSIS — M1990 Unspecified osteoarthritis, unspecified site: Secondary | ICD-10-CM | POA: Diagnosis present

## 2015-12-24 DIAGNOSIS — D62 Acute posthemorrhagic anemia: Secondary | ICD-10-CM | POA: Diagnosis not present

## 2015-12-24 DIAGNOSIS — N179 Acute kidney failure, unspecified: Secondary | ICD-10-CM | POA: Diagnosis not present

## 2015-12-24 DIAGNOSIS — D72829 Elevated white blood cell count, unspecified: Secondary | ICD-10-CM | POA: Diagnosis present

## 2015-12-24 DIAGNOSIS — Y92018 Other place in single-family (private) house as the place of occurrence of the external cause: Secondary | ICD-10-CM | POA: Diagnosis not present

## 2015-12-24 DIAGNOSIS — S72001A Fracture of unspecified part of neck of right femur, initial encounter for closed fracture: Secondary | ICD-10-CM

## 2015-12-24 DIAGNOSIS — S72001D Fracture of unspecified part of neck of right femur, subsequent encounter for closed fracture with routine healing: Secondary | ICD-10-CM | POA: Diagnosis not present

## 2015-12-24 DIAGNOSIS — G8929 Other chronic pain: Secondary | ICD-10-CM

## 2015-12-24 DIAGNOSIS — M25551 Pain in right hip: Secondary | ICD-10-CM | POA: Diagnosis present

## 2015-12-24 DIAGNOSIS — I10 Essential (primary) hypertension: Secondary | ICD-10-CM | POA: Diagnosis not present

## 2015-12-24 DIAGNOSIS — Z79899 Other long term (current) drug therapy: Secondary | ICD-10-CM | POA: Diagnosis not present

## 2015-12-24 DIAGNOSIS — R1310 Dysphagia, unspecified: Secondary | ICD-10-CM | POA: Diagnosis not present

## 2015-12-24 HISTORY — PX: HIP ARTHROPLASTY: SHX981

## 2015-12-24 HISTORY — DX: Dry eye syndrome of bilateral lacrimal glands: H04.123

## 2015-12-24 HISTORY — DX: Other pulmonary embolism without acute cor pulmonale: I26.99

## 2015-12-24 LAB — CBC
HCT: 38.1 % (ref 36.0–46.0)
Hemoglobin: 12.4 g/dL (ref 12.0–15.0)
MCH: 28.9 pg (ref 26.0–34.0)
MCHC: 32.5 g/dL (ref 30.0–36.0)
MCV: 88.8 fL (ref 78.0–100.0)
Platelets: 294 10*3/uL (ref 150–400)
RBC: 4.29 MIL/uL (ref 3.87–5.11)
RDW: 12.7 % (ref 11.5–15.5)
WBC: 13.6 10*3/uL — AB (ref 4.0–10.5)

## 2015-12-24 LAB — BASIC METABOLIC PANEL
ANION GAP: 8 (ref 5–15)
BUN: 35 mg/dL — ABNORMAL HIGH (ref 6–20)
CALCIUM: 9.8 mg/dL (ref 8.9–10.3)
CO2: 29 mmol/L (ref 22–32)
Chloride: 105 mmol/L (ref 101–111)
Creatinine, Ser: 1.2 mg/dL — ABNORMAL HIGH (ref 0.44–1.00)
GFR, EST AFRICAN AMERICAN: 48 mL/min — AB (ref >60)
GFR, EST NON AFRICAN AMERICAN: 41 mL/min — AB (ref >60)
GLUCOSE: 114 mg/dL — AB (ref 65–99)
Potassium: 4.4 mmol/L (ref 3.5–5.1)
Sodium: 142 mmol/L (ref 135–145)

## 2015-12-24 LAB — PROTIME-INR
INR: 1.04 (ref 0.00–1.49)
Prothrombin Time: 13.8 seconds (ref 11.6–15.2)

## 2015-12-24 SURGERY — HEMIARTHROPLASTY, HIP, DIRECT ANTERIOR APPROACH, FOR FRACTURE
Anesthesia: General | Laterality: Right

## 2015-12-24 MED ORDER — SUGAMMADEX SODIUM 200 MG/2ML IV SOLN
INTRAVENOUS | Status: AC
  Filled 2015-12-24: qty 2

## 2015-12-24 MED ORDER — HYDROCODONE-ACETAMINOPHEN 5-325 MG PO TABS: 1.0000 | ORAL_TABLET | Freq: Four times a day (QID) | ORAL | Status: DC | PRN

## 2015-12-24 MED ORDER — FENTANYL CITRATE (PF) 100 MCG/2ML IJ SOLN
INTRAMUSCULAR | Status: AC
  Filled 2015-12-24: qty 2

## 2015-12-24 MED ORDER — LIDOCAINE HCL (CARDIAC) 20 MG/ML IV SOLN
INTRAVENOUS | Status: DC | PRN
  Administered 2015-12-24: 50 mg via INTRAVENOUS

## 2015-12-24 MED ORDER — CLINDAMYCIN PHOSPHATE 600 MG/50ML IV SOLN
INTRAVENOUS | Status: DC | PRN
  Administered 2015-12-24: 900 mg via INTRAVENOUS

## 2015-12-24 MED ORDER — LIDOCAINE HCL (CARDIAC) 20 MG/ML IV SOLN
INTRAVENOUS | Status: AC
  Filled 2015-12-24: qty 5

## 2015-12-24 MED ORDER — HYDROMORPHONE HCL 1 MG/ML IJ SOLN
1.0000 mg | Freq: Once | INTRAMUSCULAR | Status: AC
  Administered 2015-12-24: 1 mg via INTRAVENOUS
  Filled 2015-12-24: qty 1

## 2015-12-24 MED ORDER — SUCCINYLCHOLINE CHLORIDE 20 MG/ML IJ SOLN
INTRAMUSCULAR | Status: DC | PRN
  Administered 2015-12-24: 100 mg via INTRAVENOUS

## 2015-12-24 MED ORDER — PROPOFOL 10 MG/ML IV BOLUS
INTRAVENOUS | Status: DC | PRN
  Administered 2015-12-24: 110 mg via INTRAVENOUS

## 2015-12-24 MED ORDER — LACTATED RINGERS IV SOLN
INTRAVENOUS | Status: DC | PRN
  Administered 2015-12-24: 22:00:00 via INTRAVENOUS

## 2015-12-24 MED ORDER — DEXAMETHASONE SODIUM PHOSPHATE 10 MG/ML IJ SOLN
INTRAMUSCULAR | Status: AC
  Filled 2015-12-24: qty 1

## 2015-12-24 MED ORDER — FENTANYL CITRATE (PF) 100 MCG/2ML IJ SOLN
INTRAMUSCULAR | Status: DC | PRN
  Administered 2015-12-24 (×2): 50 ug via INTRAVENOUS
  Administered 2015-12-24: 100 ug via INTRAVENOUS

## 2015-12-24 MED ORDER — DEXAMETHASONE SODIUM PHOSPHATE 10 MG/ML IJ SOLN
INTRAMUSCULAR | Status: DC | PRN
  Administered 2015-12-24: 10 mg via INTRAVENOUS

## 2015-12-24 MED ORDER — MORPHINE SULFATE (PF) 4 MG/ML IV SOLN
4.0000 mg | Freq: Once | INTRAVENOUS | Status: AC
  Administered 2015-12-24: 4 mg via INTRAVENOUS
  Filled 2015-12-24: qty 1

## 2015-12-24 MED ORDER — PROPOFOL 10 MG/ML IV BOLUS
INTRAVENOUS | Status: AC
  Filled 2015-12-24: qty 20

## 2015-12-24 MED ORDER — HYDROMORPHONE HCL 2 MG/ML IJ SOLN
INTRAMUSCULAR | Status: AC
  Filled 2015-12-24: qty 1

## 2015-12-24 MED ORDER — 0.9 % SODIUM CHLORIDE (POUR BTL) OPTIME
TOPICAL | Status: DC | PRN
  Administered 2015-12-24: 1000 mL

## 2015-12-24 MED ORDER — HYDROMORPHONE HCL 1 MG/ML IJ SOLN: 0.5000 mg | INTRAMUSCULAR | Status: DC | PRN

## 2015-12-24 MED ORDER — ONDANSETRON HCL 4 MG/2ML IJ SOLN
INTRAMUSCULAR | Status: AC
  Filled 2015-12-24: qty 2

## 2015-12-24 MED ORDER — ROCURONIUM BROMIDE 100 MG/10ML IV SOLN
INTRAVENOUS | Status: DC | PRN
Start: 2015-12-24 — End: 2015-12-25
  Administered 2015-12-24: 25 mg via INTRAVENOUS
  Administered 2015-12-24: 5 mg via INTRAVENOUS

## 2015-12-24 MED ORDER — POVIDONE-IODINE 10 % EX SOLN
CUTANEOUS | Status: DC | PRN
  Administered 2015-12-24: 1 via TOPICAL

## 2015-12-24 MED ORDER — SODIUM CHLORIDE 0.9 % IV SOLN
INTRAVENOUS | Status: DC | PRN
  Administered 2015-12-24: via INTRAVENOUS

## 2015-12-24 MED ORDER — ACETAMINOPHEN 10 MG/ML IV SOLN
INTRAVENOUS | Status: DC | PRN
  Administered 2015-12-24: 1000 mg via INTRAVENOUS

## 2015-12-24 MED ORDER — ACETAMINOPHEN 10 MG/ML IV SOLN
INTRAVENOUS | Status: AC
  Filled 2015-12-24: qty 100

## 2015-12-24 MED ORDER — METHOCARBAMOL 1000 MG/10ML IJ SOLN
500.0000 mg | Freq: Once | INTRAMUSCULAR | Status: DC
Start: 2015-12-24 — End: 2015-12-24

## 2015-12-24 MED ORDER — CLINDAMYCIN PHOSPHATE 900 MG/50ML IV SOLN
INTRAVENOUS | Status: AC
  Filled 2015-12-24: qty 50

## 2015-12-24 MED ORDER — METHOCARBAMOL 1000 MG/10ML IJ SOLN
500.0000 mg | Freq: Once | INTRAVENOUS | Status: AC
  Administered 2015-12-25: 500 mg via INTRAVENOUS
  Filled 2015-12-24 (×2): qty 5

## 2015-12-24 MED ORDER — ONDANSETRON HCL 4 MG/2ML IJ SOLN
INTRAMUSCULAR | Status: DC | PRN
Start: 2015-12-24 — End: 2015-12-25
  Administered 2015-12-24: 4 mg via INTRAVENOUS

## 2015-12-24 MED ORDER — EPHEDRINE SULFATE 50 MG/ML IJ SOLN
INTRAMUSCULAR | Status: DC | PRN
  Administered 2015-12-24: 5 mg via INTRAVENOUS
  Administered 2015-12-24: 10 mg via INTRAVENOUS
  Administered 2015-12-24: 5 mg via INTRAVENOUS

## 2015-12-24 MED ORDER — SUGAMMADEX SODIUM 200 MG/2ML IV SOLN
INTRAVENOUS | Status: DC | PRN
  Administered 2015-12-24: 150 mg via INTRAVENOUS

## 2015-12-24 MED ORDER — ENOXAPARIN SODIUM 40 MG/0.4ML ~~LOC~~ SOLN
40.0000 mg | SUBCUTANEOUS | Status: DC
Start: 2015-12-24 — End: 2015-12-27

## 2015-12-24 MED ORDER — HYDROMORPHONE HCL 1 MG/ML IJ SOLN
INTRAMUSCULAR | Status: DC | PRN
  Administered 2015-12-24 (×2): 0.5 mg via INTRAVENOUS
  Administered 2015-12-24: 1 mg via INTRAVENOUS

## 2015-12-24 SURGICAL SUPPLY — 41 items
BAG SPEC THK2 15X12 ZIP CLS (MISCELLANEOUS) ×1
BAG ZIPLOCK 12X15 (MISCELLANEOUS) ×3 IMPLANT
BLADE SAW SAG 73X25 THK (BLADE) ×2
BLADE SAW SGTL 73X25 THK (BLADE) ×1 IMPLANT
CAPT HIP HEMI 2 ×2 IMPLANT
CLOSURE WOUND 1/2 X4 (GAUZE/BANDAGES/DRESSINGS)
DRAPE INCISE IOBAN 66X45 STRL (DRAPES) ×3 IMPLANT
DRAPE ORTHO SPLIT 77X108 STRL (DRAPES) ×6
DRAPE POUCH INSTRU U-SHP 10X18 (DRAPES) ×3 IMPLANT
DRAPE SURG ORHT 6 SPLT 77X108 (DRAPES) ×2 IMPLANT
DRAPE U-SHAPE 47X51 STRL (DRAPES) ×3 IMPLANT
DRSG EMULSION OIL 3X16 NADH (GAUZE/BANDAGES/DRESSINGS) ×3 IMPLANT
DRSG MEPILEX BORDER 4X4 (GAUZE/BANDAGES/DRESSINGS) IMPLANT
DRSG MEPILEX BORDER 4X8 (GAUZE/BANDAGES/DRESSINGS) ×3 IMPLANT
DURAPREP 26ML APPLICATOR (WOUND CARE) ×2 IMPLANT
ELECT BLADE TIP CTD 4 INCH (ELECTRODE) ×2 IMPLANT
ELECT REM PT RETURN 9FT ADLT (ELECTROSURGICAL) ×3
ELECTRODE REM PT RTRN 9FT ADLT (ELECTROSURGICAL) ×1 IMPLANT
EVACUATOR 1/8 PVC DRAIN (DRAIN) IMPLANT
GAUZE SPONGE 4X4 12PLY STRL (GAUZE/BANDAGES/DRESSINGS) IMPLANT
GLOVE BIOGEL PI IND STRL 8.5 (GLOVE) IMPLANT
GLOVE BIOGEL PI INDICATOR 8.5 (GLOVE) ×2
GLOVE ORTHO TXT STRL SZ7.5 (GLOVE) ×3 IMPLANT
GLOVE SURG ORTHO 8.5 STRL (GLOVE) ×3 IMPLANT
GOWN STRL REUS W/TWL LRG LVL3 (GOWN DISPOSABLE) ×6 IMPLANT
IMMOBILIZER KNEE 20 (SOFTGOODS)
IMMOBILIZER KNEE 20 THIGH 36 (SOFTGOODS) IMPLANT
MANIFOLD NEPTUNE II (INSTRUMENTS) ×3 IMPLANT
PACK TOTAL JOINT (CUSTOM PROCEDURE TRAY) ×3 IMPLANT
POSITIONER SURGICAL ARM (MISCELLANEOUS) ×3 IMPLANT
STAPLER VISISTAT 35W (STAPLE) ×3 IMPLANT
STRIP CLOSURE SKIN 1/2X4 (GAUZE/BANDAGES/DRESSINGS) IMPLANT
SUT ETHIBOND NAB CT1 #1 30IN (SUTURE) ×9 IMPLANT
SUT MNCRL AB 4-0 PS2 18 (SUTURE) IMPLANT
SUT VIC AB 1 CT1 36 (SUTURE) ×6 IMPLANT
SUT VIC AB 2-0 CT1 27 (SUTURE) ×6
SUT VIC AB 2-0 CT1 TAPERPNT 27 (SUTURE) ×2 IMPLANT
TOWEL OR 17X26 10 PK STRL BLUE (TOWEL DISPOSABLE) ×6 IMPLANT
TOWER CARTRIDGE SMART MIX (DISPOSABLE) ×1 IMPLANT
TRAY FOLEY W/METER SILVER 14FR (SET/KITS/TRAYS/PACK) ×2 IMPLANT
TRAY FOLEY W/METER SILVER 16FR (SET/KITS/TRAYS/PACK) IMPLANT

## 2015-12-24 NOTE — Anesthesia Procedure Notes (Signed)
Procedure Name: Intubation Date/Time: 12/24/2015 10:40 PM Performed by: Amauri Medellin, Virgel Gess Pre-anesthesia Checklist: Patient identified, Emergency Drugs available, Suction available, Patient being monitored and Timeout performed Patient Re-evaluated:Patient Re-evaluated prior to inductionOxygen Delivery Method: Circle system utilized Preoxygenation: Pre-oxygenation with 100% oxygen Intubation Type: IV induction Ventilation: Mask ventilation without difficulty Laryngoscope Size: Mac and 3 Grade View: Grade I Tube type: Oral Tube size: 7.0 mm Number of attempts: 1 Airway Equipment and Method: Stylet Placement Confirmation: ETT inserted through vocal cords under direct vision,  positive ETCO2,  CO2 detector and breath sounds checked- equal and bilateral Secured at: 21 cm Tube secured with: Tape Dental Injury: Teeth and Oropharynx as per pre-operative assessment

## 2015-12-24 NOTE — ED Provider Notes (Signed)
CSN: [id number]     Arrival date & time 12/24/15  1943 History   First MD Initiated Contact with Patient 12/24/15 1953     Chief Complaint  Patient presents with  . Fall  . Hip Pain     (Consider location/radiation/quality/duration/timing/severity/associated sxs/prior Treatment) HPI Comments: Patient presents to the emergency department with chief complaint of right hip pain. She states that she tripped over a metal piece on her fireplace and fell to the ground this evening. She landed on her right side. She states that she has been unable to walk following the fall. She was brought to the ED by EMS.  She was given 250 mcg of fentanyl. She reports severe right hip pain.  It is worsened with palpation and movement.  The history is provided by the patient. No language interpreter was used.    Past Medical History  Diagnosis Date  . Osteoarthritis   . Osteoporosis   . Pneumonia   . Chronic pain disorder     with narcotic management  . Hyperlipidemia   . Hypertension   . Chronic back pain     with degenerative joint disease  . Chronic back pain    Past Surgical History  Procedure Laterality Date  . Appendectomy    . Abdominal hysterectomy    . Tonsillectomy     Family History  Problem Relation Age of Onset  . Heart Problems Mother    Social History  Substance Use Topics  . Smoking status: Former Smoker -- 13 years    Quit date: 04/15/1976  . Smokeless tobacco: Never Used  . Alcohol Use: No   OB History    No data available     Review of Systems  Constitutional: Negative for fever and chills.  Respiratory: Negative for shortness of breath.   Cardiovascular: Negative for chest pain.  Gastrointestinal: Negative for nausea, vomiting, diarrhea and constipation.  Genitourinary: Negative for dysuria.  Musculoskeletal: Positive for arthralgias.  All other systems reviewed and are negative.     Allergies  Amoxicillin-pot clavulanate; Lisinopril-hydrochlorothiazide;  and Sulfonamide derivatives  Home Medications   Prior to Admission medications   Medication Sig Start Date End Date Taking? Authorizing Provider  aspirin EC 81 MG tablet Take 81 mg by mouth daily.   Yes Historical Provider, MD  BIOTIN PO Take 1 tablet by mouth daily.   Yes Historical Provider, MD  fentaNYL (DURAGESIC - DOSED MCG/HR) 50 MCG/HR Place 1 patch onto the skin every 3 (three) days. 03/20/15  Yes Historical Provider, MD  fluorometholone (FML) 0.1 % ophthalmic suspension Place 1 drop into both eyes daily.  03/25/15  Yes Historical Provider, MD  Multiple Vitamin (MULTIVITAMIN) tablet Take 1 tablet by mouth daily.   Yes Historical Provider, MD  oxycodone (ROXICODONE) 30 MG immediate release tablet Take 30 mg by mouth 4 (four) times daily as needed. pain 01/16/15  Yes Historical Provider, MD  donepezil (ARICEPT) 5 MG tablet TAKE 1 TABLET (5 MG TOTAL) BY MOUTH AT BEDTIME. Patient not taking: Reported on 04/19/2015 12/14/14   Penni Bombard, MD  donepezil (ARICEPT) 5 MG tablet TAKE 1 TABLET (5 MG TOTAL) BY MOUTH AT BEDTIME. Patient not taking: Reported on 04/19/2015 01/16/15   Penni Bombard, MD   BP 123/77 mmHg  Pulse 90  Temp(Src) 98.2 F (36.8 C) (Oral)  Resp 19  Ht 5' (1.524 m)  Wt 54.432 kg  BMI 23.44 kg/m2  SpO2 94% Physical Exam  Constitutional: She is oriented to person, place,  and time. She appears well-developed and well-nourished.  HENT:  Head: Normocephalic and atraumatic.  Eyes: Conjunctivae and EOM are normal. Pupils are equal, round, and reactive to light.  Neck: Normal range of motion. Neck supple.  Cardiovascular: Normal rate, regular rhythm and intact distal pulses.  Exam reveals no gallop and no friction rub.   No murmur heard. Intact distal pulses with brisk capillary refill  Pulmonary/Chest: Effort normal and breath sounds normal. No respiratory distress. She has no wheezes. She has no rales. She exhibits no tenderness.  Abdominal: Soft. Bowel sounds are  normal. She exhibits no distension and no mass. There is no tenderness. There is no rebound and no guarding.  Musculoskeletal: Normal range of motion. She exhibits no edema or tenderness.  Right hip tender to palpation, ROM and strength deferred 2/2 pain, no obvious bony abnormality or deformity  Neurological: She is alert and oriented to person, place, and time.  Sensation intact  Skin: Skin is warm and dry.  Psychiatric: She has a normal mood and affect. Her behavior is normal. Judgment and thought content normal.  Nursing note and vitals reviewed.   ED Course  Procedures (including critical care time) Labs Review Labs Reviewed  CBC - Abnormal; Notable for the following:    WBC 13.6 (*)    All other components within normal limits  BASIC METABOLIC PANEL    Imaging Review Dg Hip Unilat  With Pelvis 2-3 Views Right  12/24/2015  CLINICAL DATA:  Unwitnessed fall, right hip injury EXAM: DG HIP (WITH OR WITHOUT PELVIS) 2-3V RIGHT COMPARISON:  None. FINDINGS: There is acute proximal right femoral neck fracture with slight impaction. Bones are osteopenic. Bony pelvis and left hip intact. Degenerative changes of the lower lumbar spine with an associated levoscoliosis. Mild SI joint arthropathy. Normal bowel gas pattern. IMPRESSION: Acute proximal right femoral neck fracture with slight impaction. Electronically Signed   By: Jerilynn Mages.  Shick M.D.   On: 12/24/2015 20:25   I have personally reviewed and evaluated these images and lab results as part of my medical decision-making.    MDM   Final diagnoses:  Fracture of femoral neck, right, closed, initial encounter    Patient with mechanical fall. She has a right hip fracture seen on x-ray as above. Discussed patient with Dr. Johnney Killian, who will see the patient. Recommends orthopedic consult.  Discussed patient with Dr. Veverly Fells who will consult, plan for admission to medicine.  Dr. Veverly Fells plans to take patient to OR tonight.  Will order EKG, CXR,  and PT INR for screening.    Patient discussed with Dr. Loleta Books, who will admit to medicine.    Montine Circle, PA-C 12/24/15 [id number]  Charlesetta Shanks, MD 01/06/16 [phone]

## 2015-12-24 NOTE — Anesthesia Preprocedure Evaluation (Addendum)
Anesthesia Evaluation  Patient identified by MRN, date of birth, ID band Patient awake    Reviewed: Allergy & Precautions, NPO status , Patient's Chart, lab work & pertinent test results  Airway Mallampati: II  TM Distance: >3 FB Neck ROM: Full    Dental no notable dental hx.    Pulmonary pneumonia, resolved, former smoker,    Pulmonary exam normal breath sounds clear to auscultation       Cardiovascular Exercise Tolerance: Good hypertension, Normal cardiovascular exam Rhythm:Regular Rate:Normal     Neuro/Psych PSYCHIATRIC DISORDERS Depression  Neuromuscular disease    GI/Hepatic negative GI ROS, Neg liver ROS,   Endo/Other  negative endocrine ROS  Renal/GU negative Renal ROS  negative genitourinary   Musculoskeletal  (+) Arthritis ,   Abdominal   Peds negative pediatric ROS (+)  Hematology negative hematology ROS (+)   Anesthesia Other Findings   Reproductive/Obstetrics negative OB ROS                             Anesthesia Physical Anesthesia Plan  ASA: II  Anesthesia Plan: General   Post-op Pain Management:    Induction: Intravenous  Airway Management Planned: Oral ETT  Additional Equipment:   Intra-op Plan:   Post-operative Plan: Extubation in OR  Informed Consent: I have reviewed the patients History and Physical, chart, labs and discussed the procedure including the risks, benefits and alternatives for the proposed anesthesia with the patient or authorized representative who has indicated his/her understanding and acceptance.   Dental advisory given  Plan Discussed with: CRNA  Anesthesia Plan Comments:         Anesthesia Quick Evaluation

## 2015-12-24 NOTE — Consult Note (Signed)
Reason for Consult: Right hip fracture Referring Physician: EDP  Fabian KARESSA ONORATO is an 81 y.o. female.  HPI: 81 yo female who lives alone who fell this evening complains of severe right hip pain.  She was unable to stand after the fall and was transported by EMS for further eval for suspected hip fracture.  Past Medical History  Diagnosis Date  . Osteoarthritis   . Osteoporosis   . Pneumonia   . Chronic pain disorder     with narcotic management  . Hyperlipidemia   . Hypertension   . Chronic back pain     with degenerative joint disease  . Chronic back pain     Past Surgical History  Procedure Laterality Date  . Appendectomy    . Abdominal hysterectomy    . Tonsillectomy      Family History  Problem Relation Age of Onset  . Heart Problems Mother     Social History:  reports that she quit smoking about 39 years ago. She has never used smokeless tobacco. She reports that she does not drink alcohol or use illicit drugs.  Allergies:  Allergies  Allergen Reactions  . Amoxicillin-Pot Clavulanate Hives and Diarrhea    Has patient had a PCN reaction causing immediate rash, facial/tongue/throat swelling, SOB or lightheadedness with hypotension: unknown Has patient had a PCN reaction causing severe rash involving mucus membranes or skin necrosis: yes Has patient had a PCN reaction that required hospitalization : unknown Has patient had a PCN reaction occurring within the last 10 years: yes If all of the above answers are "NO", then may proceed with Cephalosporin use.   Marland Kitchen Lisinopril-Hydrochlorothiazide Other (See Comments)    Tired- no energy to do anything..  . Sulfonamide Derivatives Hives    Medications: I have reviewed the patient's current medications.  Results for orders placed or performed during the hospital encounter of 12/24/15 (from the past 48 hour(s))  CBC     Status: Abnormal   Collection Time: 12/24/15  8:30 PM  Result Value Ref Range   WBC 13.6 (H) 4.0 -  10.5 K/uL   RBC 4.29 3.87 - 5.11 MIL/uL   Hemoglobin 12.4 12.0 - 15.0 g/dL   HCT 38.1 36.0 - 46.0 %   MCV 88.8 78.0 - 100.0 fL   MCH 28.9 26.0 - 34.0 pg   MCHC 32.5 30.0 - 36.0 g/dL   RDW 12.7 11.5 - 15.5 %   Platelets 294 150 - 400 K/uL  Basic metabolic panel     Status: Abnormal   Collection Time: 12/24/15  8:30 PM  Result Value Ref Range   Sodium 142 135 - 145 mmol/L   Potassium 4.4 3.5 - 5.1 mmol/L   Chloride 105 101 - 111 mmol/L   CO2 29 22 - 32 mmol/L   Glucose, Bld 114 (H) 65 - 99 mg/dL   BUN 35 (H) 6 - 20 mg/dL   Creatinine, Ser 1.20 (H) 0.44 - 1.00 mg/dL   Calcium 9.8 8.9 - 10.3 mg/dL   GFR calc non Af Amer 41 (L) >60 mL/min   GFR calc Af Amer 48 (L) >60 mL/min    Comment: (NOTE) The eGFR has been calculated using the CKD EPI equation. This calculation has not been validated in all clinical situations. eGFR's persistently <60 mL/min signify possible Chronic Kidney Disease.    Anion gap 8 5 - 15    Dg Hip Unilat  With Pelvis 2-3 Views Right  12/24/2015  CLINICAL DATA:  Unwitnessed fall,  right hip injury EXAM: DG HIP (WITH OR WITHOUT PELVIS) 2-3V RIGHT COMPARISON:  None. FINDINGS: There is acute proximal right femoral neck fracture with slight impaction. Bones are osteopenic. Bony pelvis and left hip intact. Degenerative changes of the lower lumbar spine with an associated levoscoliosis. Mild SI joint arthropathy. Normal bowel gas pattern. IMPRESSION: Acute proximal right femoral neck fracture with slight impaction. Electronically Signed   By: Jerilynn Mages.  Shick M.D.   On: 12/24/2015 20:25    ROS Blood pressure 123/77, pulse 90, temperature 98.2 F (36.8 C), temperature source Oral, resp. rate 19, height 5' (1.524 m), weight 54.432 kg (120 lb), SpO2 94 %. Physical Exam AAO severe distress, neck nontender, bilateral UEs with no deformity and baseline ROM, no pain. Chest non tender and abdomen soft and scaphoid. Right LE flexed up and short, NVI distally, Left LE with no pain with  AROM   Assessment/Plan: Right displaced femoral neck fracture.  Plan right hip hemiarthroplasty tonight with medicine admit. Will need SNF after as she lives alone.  Allysia Ingles,STEVEN R 12/24/2015, 9:33 PM

## 2015-12-24 NOTE — H&P (Signed)
History and Physical  Patient Name: Angelica Bell     [id number]    DOB: 02/27/1932    DOA: 12/24/2015 Referring physician: Lorre Munroe, PA-C PCP: Cindee Salt, MD      Chief Complaint: Hip pain  HPI: Angelica Bell is a 81 y.o. female with a past medical history significant for HTN and chronic pain on high dose oral opioid therapy who presents with hip pain after fall.  The patient was in her normal state of health until this evening when she had a mechanical fall she reports while alone in her house.  She had immediate R hip pain and could not walk.    In the ED, the patient was in pain and a plain radiograph of the right hip showed a small RIGHT femoral neck fracture.  The case was discussed with Dr. Veverly Fells from Orthopedics who agreed to see the patient, and TRH were asked to admit to [facility] for medical management.    There was no preceding dizziness, weakness, lightheadedness or vertigo, no chest discomfort, palpitations, fever, cough, or dyspnea, nor are there any of those symptoms now.  The patient denies active heart issues, angina, or history of MI. She is able to exert to an equivalent of 4 METS without difficulty.  She denies history of TIAs/CVAs, CAD, CHF, or DM treated with insulin. She has no history of COPD or symptoms of OSA, including snoring, daytime drowsiness, apnea.  She has no history of prolonged steroid use and does not use insulin.  She does not use alcohol, and has no history of withdrawal symptoms or delirium in the context of previous surgeries.  Anesthesia Specific concerns: Presence of loose teeth: Dentures Anesthesia problems in past: Being awake during conscious sedation because of her degree of opioid tolerance History of bleeding disorder: None History of PE: After hysterectomy in 1967  Review of Systems:  Pt complains of severe R hip pain with movement. All other systems negative except as just noted or noted in the history of present  illness.     Allergies  Allergen Reactions  . Amoxicillin-Pot Clavulanate Hives and Diarrhea    Has patient had a PCN reaction causing immediate rash, facial/tongue/throat swelling, SOB or lightheadedness with hypotension: unknown Has patient had a PCN reaction causing severe rash involving mucus membranes or skin necrosis: yes Has patient had a PCN reaction that required hospitalization : unknown Has patient had a PCN reaction occurring within the last 10 years: yes If all of the above answers are "NO", then may proceed with Cephalosporin use.   Marland Kitchen Lisinopril-Hydrochlorothiazide Other (See Comments)    Tired- no energy to do anything..  . Sulfonamide Derivatives Hives    Prior to Admission medications   Medication Sig Start Date End Date Taking? Authorizing Provider  aspirin EC 81 MG tablet Take 81 mg by mouth daily.   Yes Historical Provider, MD  BIOTIN PO Take 1 tablet by mouth daily.   Yes Historical Provider, MD  fentaNYL (DURAGESIC - DOSED MCG/HR) 50 MCG/HR Place 1 patch onto the skin every 3 (three) days. 03/20/15  Yes Historical Provider, MD  fluorometholone (FML) 0.1 % ophthalmic suspension Place 1 drop into both eyes daily.  03/25/15  Yes Historical Provider, MD  Multiple Vitamin (MULTIVITAMIN) tablet Take 1 tablet by mouth daily.   Yes Historical Provider, MD  oxycodone (ROXICODONE) 30 MG immediate release tablet Take 30 mg by mouth 4 (four) times daily as needed. pain 01/16/15  Yes Historical Provider, MD  Past Medical History  Diagnosis Date  . Osteoporosis   . Pneumonia   . Chronic pain disorder     with narcotic management  . Hypertension   . Chronic back pain     with degenerative joint disease  . PE (pulmonary embolism)     After hysterectomy in 1967  . Dry eyes     Past Surgical History  Procedure Laterality Date  . Appendectomy    . Abdominal hysterectomy    . Tonsillectomy    . Breast enhancement surgery      Family history: family history includes  Heart Problems in her mother.  Social History: Patient lives alone.  She ambulates without a walker at baseline.  She is independent with all ADLs and IADLs, and has two grandsons and a daughter in town who look in on her occasionally.  She does not smoke anymore and does not drink.   She is from [place] originally, lived in [place] for a time and has been in [place] since 1971.    Physical Exam: BP 123/77 mmHg  Pulse 90  Temp(Src) 98.2 F (36.8 C) (Oral)  Resp 19  Ht 5' (1.524 m)  Wt 54.432 kg (120 lb)  BMI 23.44 kg/m2  SpO2 94% General appearance: Elderly  female, alert and in moderate distress from pain.   Eyes: Anicteric, conjunctiva pink and injected, lids and lashes normal.  The pupils are deformed bilaterally. ENT: No nasal deformity, discharge, or epistaxis.  OP moist without lesions.  Mallampati 2. Lymph: No cervical, supraclavicular lymphadenopathy. Skin: Warm and dry.  No jaundice.   Cardiac: RRR, nl S1-S2, no murmurs appreciated.  Capillary refill is brisk.  No LE edema.  Radial pulses 2+ and symmetric. Respiratory: Normal respiratory rate and rhythm.  CTAB without rales or wheezes. Abdomen: Abdomen without ascites, distension.   MSK: No deformities or effusions.  Right hip guarding. Neuro: Sensorium intact and responding to questions, attention normal.  Speech is fluent.  Moves all extremities equally and with normal coordination.    Psych: Behavior appropriate.  Affect tearful.  No evidence of aural or visual hallucinations or delusions.       Labs on Admission:  The metabolic panel shows normal sodium, potassium. The creatinine is acutely elevated and the BUN to creatinine ratio is high. The complete blood count shows mild leukocytosis without anemia or thrombocytopenia.   Radiological Exams on Admission: Personally reviewed: Dg Chest 2 View 12/24/2015 No acute disease.    Dg Hip Unilat  With Pelvis 2-3 Views Right 12/24/2015  IIMPRESSION: Acute proximal right  femoral neck fracture with slight impaction. Electronically Signed   By: Jerilynn Mages.  Shick M.D.   On: 12/24/2015 20:25    EKG: Independently reviewed. Rate 92, sinus.  No STTW changes.  QRS normal and QTc 417, normal.    Assessment and Plan: 1. Hip fracture: The patient will be seen by Dr. Veverly Fells who plans for operative fixation of the R hip tonight.   -Admit to med-surg bed -Oxycodone or hydromorphone as tolerated for pain -Bed rest, apply ice, document sedation and vitals per Hip fracture protocol -NPO -MIVF   Overall, the patient is at low risk for the planned surgery.   Cardiac: Patient has a RCRI score of 0 (active cardiac condition, CHF, CAD, DM treated with insulin, TIA/CVA, Cr > 2.0). The patient has no active cardiac symptoms, a functional capacity >4 METs, and would be expected to be at average risk for cardiac complications from this intermediate risk procedure. -  No further testing needed  Pulmonary: Patient does not have diagnosed COPD or OSA. Patient is at average risk for pulmonary complications.   Endocrine: Patient has no history of steroid use or DM.  DVT ppx: Patient is at average risk for DVTs. -Defer to surgeon's usual protocol.     2. Hypertension: Patient recently diagnosed with HTN she claims, and started on lisinopril HCTZ, which she did not tolerate and discontinued.  Normotensive at admission. -Monitor and consider labetalol 5 mg IV PRN for elevated BP  3. AKI: The patient presents with acute renal failure.  Likely pre-renal.  Administer fluids before surgery.  -Urinalysis and urine electrolytes are ordered.  4. Chronic pain: The patient is on nearly 300 OME daily. She required fentanyl 250 mcg reportedly by EMS and is still in severe pain.  Pain control will be challenging for this patient.   -Consider opioid tolerant PCA.  5. Leukocytosis: Likely reactive.  Asymptomatic. -Monitor.      DVT PPx: SCDs Diet: NPO  Consultants: Orthopedics Code  Status: Full Family Communication: None  Medical decision making: What exists of the patient's previous chart and CareEverywhere was reviewed in depth and the case was discussed with Lorre Munroe. Patient seen 10:12 PM on 12/24/2015.  Disposition Plan:  Admit to [facility] for hip fracture.  Consult with Ortho.    Edwin Dada [facility] Pager [phone]

## 2015-12-24 NOTE — ED Notes (Signed)
Bed: WA08 Expected date:  Expected time:  Means of arrival:  Comments: Fall, hip pain

## 2015-12-24 NOTE — ED Notes (Signed)
Pt presents from home after unwitnessed fall at home around 530pm.  She c/o right hip pain and tenderness to palpation, no shortening or rotation noted per EMS.  She has had many documented falls in recent months, and has several broken bones in her left arm and shoulder due to previous injury.  Pt denies hitting head, no LOC, no nausea/vomiting/dizziness.  Pain rated 10/10.  She received 258mcg Fentanyl en route.

## 2015-12-24 NOTE — Brief Op Note (Signed)
12/24/2015  11:46 PM  PATIENT:  [name]  81 y.o. female  PRE-OPERATIVE DIAGNOSIS:  Right femoral neck fracture, displaced  POST-OPERATIVE DIAGNOSIS:  Right femoral neck fracture, displaced  PROCEDURE:  Procedure(s): ARTHROPLASTY BIPOLAR HIP (HEMIARTHROPLASTY) (Right) DePuy Triloc  SURGEON:  Surgeon(s) and Role:    * Netta Cedars, MD - Primary  PHYSICIAN ASSISTANT:   ASSISTANTS: Ventura Bruns, PA-C   ANESTHESIA:   General  EBL:  Total I/O In: 1000 [I.V.:1000] Out: 300 [Urine:150; Blood:150]  BLOOD ADMINISTERED:none  DRAINS: none   LOCAL MEDICATIONS USED:  NONE  SPECIMEN:  No Specimen  DISPOSITION OF SPECIMEN:  N/A  COUNTS:  YES  TOURNIQUET:  * No tourniquets in log *  DICTATION: .Other Dictation: Dictation Number [id number]  PLAN OF CARE: Admit to inpatient   PATIENT DISPOSITION:  PACU - hemodynamically stable.   Delay start of Pharmacological VTE agent (>24hrs) due to surgical blood loss or risk of bleeding: no

## 2015-12-25 ENCOUNTER — Inpatient Hospital Stay (HOSPITAL_COMMUNITY): Payer: Medicare Other

## 2015-12-25 DIAGNOSIS — S72001A Fracture of unspecified part of neck of right femur, initial encounter for closed fracture: Secondary | ICD-10-CM | POA: Diagnosis present

## 2015-12-25 DIAGNOSIS — S72001S Fracture of unspecified part of neck of right femur, sequela: Secondary | ICD-10-CM

## 2015-12-25 LAB — URINALYSIS, ROUTINE W REFLEX MICROSCOPIC
Bilirubin Urine: NEGATIVE
GLUCOSE, UA: NEGATIVE mg/dL
HGB URINE DIPSTICK: NEGATIVE
KETONES UR: NEGATIVE mg/dL
Leukocytes, UA: NEGATIVE
Nitrite: NEGATIVE
PROTEIN: NEGATIVE mg/dL
Specific Gravity, Urine: 1.026 (ref 1.005–1.030)
pH: 5.5 (ref 5.0–8.0)

## 2015-12-25 LAB — BASIC METABOLIC PANEL
ANION GAP: 7 (ref 5–15)
BUN: 34 mg/dL — AB (ref 6–20)
CHLORIDE: 107 mmol/L (ref 101–111)
CO2: 23 mmol/L (ref 22–32)
CREATININE: 0.98 mg/dL (ref 0.44–1.00)
Calcium: 8.5 mg/dL — ABNORMAL LOW (ref 8.9–10.3)
GFR calc non Af Amer: 53 mL/min — ABNORMAL LOW (ref >60)
Glucose, Bld: 251 mg/dL — ABNORMAL HIGH (ref 65–99)
POTASSIUM: 4.7 mmol/L (ref 3.5–5.1)
Sodium: 137 mmol/L (ref 135–145)

## 2015-12-25 LAB — CBC
HEMATOCRIT: 30.3 % — AB (ref 36.0–46.0)
HEMOGLOBIN: 10.1 g/dL — AB (ref 12.0–15.0)
MCH: 29.4 pg (ref 26.0–34.0)
MCHC: 33.3 g/dL (ref 30.0–36.0)
MCV: 88.1 fL (ref 78.0–100.0)
Platelets: 216 10*3/uL (ref 150–400)
RBC: 3.44 MIL/uL — ABNORMAL LOW (ref 3.87–5.11)
RDW: 12.8 % (ref 11.5–15.5)
WBC: 14.2 10*3/uL — AB (ref 4.0–10.5)

## 2015-12-25 LAB — CREATININE, URINE, RANDOM: Creatinine, Urine: 86.83 mg/dL

## 2015-12-25 LAB — SODIUM, URINE, RANDOM: Sodium, Ur: 58 mmol/L

## 2015-12-25 MED ORDER — ACETAMINOPHEN 325 MG PO TABS
650.0000 mg | ORAL_TABLET | Freq: Four times a day (QID) | ORAL | Status: DC | PRN
  Administered 2015-12-26: 650 mg via ORAL

## 2015-12-25 MED ORDER — ACETAMINOPHEN 650 MG RE SUPP: 650.0000 mg | Freq: Four times a day (QID) | RECTAL | Status: DC | PRN

## 2015-12-25 MED ORDER — PHENOL 1.4 % MT LIQD
1.0000 | OROMUCOSAL | Status: DC | PRN
  Filled 2015-12-25: qty 177

## 2015-12-25 MED ORDER — SENNOSIDES-DOCUSATE SODIUM 8.6-50 MG PO TABS: 1.0000 | ORAL_TABLET | Freq: Every evening | ORAL | Status: DC | PRN

## 2015-12-25 MED ORDER — TRAZODONE 25 MG HALF TABLET
25.0000 mg | ORAL_TABLET | Freq: Once | ORAL | Status: AC
  Administered 2015-12-25: 25 mg via ORAL
  Filled 2015-12-25: qty 1

## 2015-12-25 MED ORDER — ONDANSETRON HCL 4 MG/2ML IJ SOLN
4.0000 mg | Freq: Four times a day (QID) | INTRAMUSCULAR | Status: DC | PRN
  Administered 2015-12-27: 4 mg via INTRAVENOUS
  Filled 2015-12-25: qty 2

## 2015-12-25 MED ORDER — FENTANYL CITRATE (PF) 100 MCG/2ML IJ SOLN
25.0000 ug | INTRAMUSCULAR | Status: DC | PRN
Start: 2015-12-25 — End: 2015-12-25
  Administered 2015-12-25 (×2): 50 ug via INTRAVENOUS

## 2015-12-25 MED ORDER — HYDROMORPHONE HCL 1 MG/ML IJ SOLN
1.0000 mg | INTRAMUSCULAR | Status: DC | PRN
  Filled 2015-12-25 (×13): qty 1

## 2015-12-25 MED ORDER — FENTANYL CITRATE (PF) 100 MCG/2ML IJ SOLN
INTRAMUSCULAR | Status: AC
  Filled 2015-12-25: qty 2

## 2015-12-25 MED ORDER — OXYCODONE HCL 5 MG PO TABS
20.0000 mg | ORAL_TABLET | ORAL | Status: DC | PRN
  Administered 2015-12-25 – 2015-12-27 (×13): 20 mg via ORAL
  Filled 2015-12-25 (×13): qty 4

## 2015-12-25 MED ORDER — HYDROMORPHONE HCL 1 MG/ML IJ SOLN
1.0000 mg | INTRAMUSCULAR | Status: DC | PRN
  Administered 2015-12-25 – 2015-12-26 (×11): 1 mg via INTRAVENOUS

## 2015-12-25 MED ORDER — FERROUS SULFATE 325 (65 FE) MG PO TABS
325.0000 mg | ORAL_TABLET | Freq: Three times a day (TID) | ORAL | Status: DC
  Administered 2015-12-25 – 2015-12-27 (×8): 325 mg via ORAL
  Filled 2015-12-25 (×10): qty 1

## 2015-12-25 MED ORDER — METOCLOPRAMIDE HCL 5 MG/ML IJ SOLN: 5.0000 mg | Freq: Three times a day (TID) | INTRAMUSCULAR | Status: DC | PRN

## 2015-12-25 MED ORDER — CLINDAMYCIN PHOSPHATE 600 MG/50ML IV SOLN
600.0000 mg | Freq: Four times a day (QID) | INTRAVENOUS | Status: AC
  Administered 2015-12-25 (×2): 600 mg via INTRAVENOUS
  Filled 2015-12-25 (×2): qty 50

## 2015-12-25 MED ORDER — METHOCARBAMOL 1000 MG/10ML IJ SOLN
500.0000 mg | Freq: Four times a day (QID) | INTRAVENOUS | Status: DC | PRN
  Filled 2015-12-25: qty 5

## 2015-12-25 MED ORDER — SODIUM CHLORIDE 0.9 % IV SOLN
INTRAVENOUS | Status: DC
  Administered 2015-12-25: 02:00:00 via INTRAVENOUS

## 2015-12-25 MED ORDER — MENTHOL 3 MG MT LOZG: 1.0000 | LOZENGE | OROMUCOSAL | Status: DC | PRN

## 2015-12-25 MED ORDER — ADULT MULTIVITAMIN W/MINERALS CH
1.0000 | ORAL_TABLET | Freq: Every day | ORAL | Status: DC
  Administered 2015-12-25 – 2015-12-27 (×3): 1 via ORAL
  Filled 2015-12-25 (×3): qty 1

## 2015-12-25 MED ORDER — ONDANSETRON HCL 4 MG PO TABS: 4.0000 mg | ORAL_TABLET | Freq: Four times a day (QID) | ORAL | Status: DC | PRN

## 2015-12-25 MED ORDER — POLYETHYLENE GLYCOL 3350 17 G PO PACK: 17.0000 g | PACK | Freq: Every day | ORAL | Status: DC | PRN

## 2015-12-25 MED ORDER — FLUOROMETHOLONE 0.1 % OP SUSP
1.0000 [drp] | Freq: Every day | OPHTHALMIC | Status: DC
  Administered 2015-12-25 – 2015-12-27 (×3): 1 [drp] via OPHTHALMIC
  Filled 2015-12-25: qty 5

## 2015-12-25 MED ORDER — METOCLOPRAMIDE HCL 10 MG PO TABS: 5.0000 mg | ORAL_TABLET | Freq: Three times a day (TID) | ORAL | Status: DC | PRN

## 2015-12-25 MED ORDER — DOCUSATE SODIUM 100 MG PO CAPS
100.0000 mg | ORAL_CAPSULE | Freq: Two times a day (BID) | ORAL | Status: DC
  Administered 2015-12-25 – 2015-12-27 (×6): 100 mg via ORAL

## 2015-12-25 MED ORDER — ENOXAPARIN SODIUM 40 MG/0.4ML ~~LOC~~ SOLN
40.0000 mg | Freq: Every day | SUBCUTANEOUS | Status: DC
  Administered 2015-12-25 – 2015-12-27 (×3): 40 mg via SUBCUTANEOUS
  Filled 2015-12-25 (×3): qty 0.4

## 2015-12-25 MED ORDER — ACETAMINOPHEN 325 MG PO TABS
650.0000 mg | ORAL_TABLET | Freq: Four times a day (QID) | ORAL | Status: DC | PRN
  Filled 2015-12-25: qty 2

## 2015-12-25 MED ORDER — FENTANYL 50 MCG/HR TD PT72
50.0000 ug | MEDICATED_PATCH | TRANSDERMAL | Status: DC
  Administered 2015-12-25 – 2015-12-27 (×2): 50 ug via TRANSDERMAL
  Filled 2015-12-25 (×2): qty 1

## 2015-12-25 MED ORDER — METHOCARBAMOL 500 MG PO TABS
500.0000 mg | ORAL_TABLET | Freq: Four times a day (QID) | ORAL | Status: DC | PRN
  Administered 2015-12-25 – 2015-12-27 (×9): 500 mg via ORAL
  Filled 2015-12-25 (×9): qty 1

## 2015-12-25 MED ORDER — BISACODYL 10 MG RE SUPP: 10.0000 mg | Freq: Every day | RECTAL | Status: DC | PRN

## 2015-12-25 NOTE — Progress Notes (Signed)
Patient request more pain medication from MD. MD on call paged. MD stated patient is taking mulitple pain medications at this time for age and weight and do not want to overdose. Continue to monitor patient. BP 107/67 mmHg  Pulse 85  Temp(Src) 98.4 F (36.9 C) (Oral)  Resp 16  Ht 5' (1.524 m)  Wt 54.432 kg (120 lb)  BMI 23.44 kg/m2  SpO2 93%. Patient notified of response.

## 2015-12-25 NOTE — Anesthesia Postprocedure Evaluation (Signed)
Anesthesia Post Note  Patient: Angelica Bell  Procedure(s) Performed: Procedure(s) (LRB): ARTHROPLASTY BIPOLAR HIP (HEMIARTHROPLASTY) (Right)  Patient location during evaluation: PACU Anesthesia Type: General Level of consciousness: awake and alert Pain management: pain level controlled Vital Signs Assessment: post-procedure vital signs reviewed and stable Respiratory status: spontaneous breathing, nonlabored ventilation, respiratory function stable and patient connected to nasal cannula oxygen Cardiovascular status: blood pressure returned to baseline and stable Postop Assessment: no signs of nausea or vomiting Anesthetic complications: no    Last Vitals:  Filed Vitals:   12/25/15 0300 12/25/15 0400  BP: 116/81 116/48  Pulse:  90  Temp: 36.7 C 36.5 C  Resp: 18 18    Last Pain:  Filed Vitals:   12/25/15 0945  PainSc: 7                  Alila Sotero J

## 2015-12-25 NOTE — Progress Notes (Signed)
[facility] PROGRESS NOTE  Angelica Bell [id number] DOB: Jan 28, 1932 DOA: 12/24/2015 PCP: Cindee Salt, MD  Assessment/Plan: Principal Problem:   Fracture of femoral neck, right, closed - Ortho on board and managing - will defer pain control to ortho: Currently on acetaminophen, dilaudid, fentanyl, and oxycodone  Active Problems:   Osteoarthritis   Osteoporosis   Chronic pain   Essential hypertension - stable currently off antihypertensives  Code Status: stable Family Communication: none at bedside Disposition Plan: pending recommendations from surgeon   Consultants:  Ortho  Procedures:  none  Antibiotics:  None  HPI/Subjective: Pt complaints of continued discomfort. Otherwise does not report any other medical condition she would like me to further evaluate  Objective: Filed Vitals:   12/25/15 1009 12/25/15 1351  BP: 114/53 107/67  Pulse: 91 85  Temp: 99 F (37.2 C) 98.4 F (36.9 C)  Resp: 16 16    Intake/Output Summary (Last 24 hours) at 12/25/15 1719 Last data filed at 12/25/15 1638  Gross per 24 hour  Intake   2150 ml  Output    875 ml  Net   1275 ml   Filed Weights   12/24/15 1943  Weight: 54.432 kg (120 lb)    Exam:   General:  Pt in nad, alert and awake  Cardiovascular: rrr, no mrg  Respiratory: cta bl, no wheezes  Abdomen: soft, nt, nd  Musculoskeletal: equal tone BL   Data Reviewed: Basic Metabolic Panel:  Recent Labs Lab 12/24/15 2030 12/25/15 0457  NA 142 137  K 4.4 4.7  CL 105 107  CO2 29 23  GLUCOSE 114* 251*  BUN 35* 34*  CREATININE 1.20* 0.98  CALCIUM 9.8 8.5*   Liver Function Tests: No results for input(s): AST, ALT, ALKPHOS, BILITOT, PROT, ALBUMIN in the last 168 hours. No results for input(s): LIPASE, AMYLASE in the last 168 hours. No results for input(s): AMMONIA in the last 168 hours. CBC:  Recent Labs Lab 12/24/15 2030 12/25/15 0457  WBC 13.6* 14.2*  HGB 12.4 10.1*  HCT 38.1 30.3*   MCV 88.8 88.1  PLT 294 216   Cardiac Enzymes: No results for input(s): CKTOTAL, CKMB, CKMBINDEX, TROPONINI in the last 168 hours. BNP (last 3 results) No results for input(s): BNP in the last 8760 hours.  ProBNP (last 3 results) No results for input(s): PROBNP in the last 8760 hours.  CBG: No results for input(s): GLUCAP in the last 168 hours.  No results found for this or any previous visit (from the past 240 hour(s)).   Studies: Dg Chest 2 View  12/24/2015  CLINICAL DATA:  Preop chest x-ray.  Hip fracture. EXAM: CHEST  2 VIEW COMPARISON:  CT 05/09/2015, LEFT shoulder FINDINGS: Normal cardiac silhouette. No effusion, infiltrate, pneumothorax. Remote fracture of the LEFT humerus. Scoliosis. IMPRESSION: No active cardiopulmonary disease. Scoliosis Electronically Signed   By: Suzy Bouchard M.D.   On: 12/24/2015 22:00   Dg Pelvis 1-2 Views  12/25/2015  CLINICAL DATA:  Status post right hip replacement. Initial encounter. EXAM: PELVIS - 1-2 VIEW COMPARISON:  Right hip radiographs performed 12/24/2015 FINDINGS: There has been interval placement of a right hip hemiarthroplasty, without evidence of loosening or new fracture. Mild degenerative change is noted at the pubic symphysis. Diffuse soft tissue air is noted overlying the right gluteus musculature. Overlying skin staples are seen. IMPRESSION: Interval placement of right hip hemiarthroplasty, without evidence of loosening or new fracture. Electronically Signed   By: Garald Balding M.D.   On: 12/25/2015  01:53   Dg Hip Unilat  With Pelvis 2-3 Views Right  12/24/2015  CLINICAL DATA:  Unwitnessed fall, right hip injury EXAM: DG HIP (WITH OR WITHOUT PELVIS) 2-3V RIGHT COMPARISON:  None. FINDINGS: There is acute proximal right femoral neck fracture with slight impaction. Bones are osteopenic. Bony pelvis and left hip intact. Degenerative changes of the lower lumbar spine with an associated levoscoliosis. Mild SI joint arthropathy. Normal  bowel gas pattern. IMPRESSION: Acute proximal right femoral neck fracture with slight impaction. Electronically Signed   By: Jerilynn Mages.  Shick M.D.   On: 12/24/2015 20:25    Scheduled Meds: . docusate sodium  100 mg Oral BID  . enoxaparin (LOVENOX) injection  40 mg Subcutaneous Daily  . fentaNYL  50 mcg Transdermal Q72H  . ferrous sulfate  325 mg Oral TID PC  . fluorometholone  1 drop Both Eyes Daily  . multivitamin with minerals  1 tablet Oral Daily   Continuous Infusions: . sodium chloride 50 mL/hr at 12/25/15 0139    Time spent: > 15 minutes  Velvet Bathe  [facility] Pager [phone]. If 7PM-7AM, please contact night-coverage at [email or web address], password [facility] 12/25/2015, 5:19 PM  LOS: 1 day

## 2015-12-25 NOTE — Progress Notes (Signed)
Initial Nutrition Assessment  DOCUMENTATION CODES:   Not applicable  REASON FOR ASSESSMENT:   Consult Hip fracture protocol  ASSESSMENT:   Angelica Bell is a 81 y.o. female with a past medical history significant for HTN and chronic pain on high dose oral opioid therapy who presents with hip pain after fall  RD Consulted for HF protocol. Pt presents with no nutrition issues, no RD interventions warranted at this time. Nutrition-Focused physical exam completed. Findings are no fat depletion, no muscle depletion, and no edema.  Please re-consult RD if further nutrition issues arise. Diet Order:  Diet regular Room service appropriate?: Yes; Fluid consistency:: Thin  Skin:  Reviewed, no issues  Last BM:  PTA  Height:   Ht Readings from Last 1 Encounters:  12/24/15 5' (1.524 m)    Weight:   Wt Readings from Last 1 Encounters:  12/24/15 120 lb (54.432 kg)    Ideal Body Weight:  45.45 kg  BMI:  Body mass index is 23.44 kg/(m^2).  Estimated Nutritional Needs:   Kcal:  1350-1650 calories  Protein:  54-65 grams  Fluid:  >/= 1.35L   Satira Anis. Cornelius Schuitema, MS, RD LDN After Hours/Weekend Pager [phone]

## 2015-12-25 NOTE — Op Note (Signed)
NAME:  Angelica Bell, GOMBERT NO.:  [id number]  MEDICAL RECORD NO.:  [id number]  LOCATION:  64                         FACILITY:  [facility]  PHYSICIAN:  Doran Heater. Veverly Fells, M.D. DATE OF BIRTH:  December 14, 1932  DATE OF PROCEDURE:  12/24/2015 DATE OF DISCHARGE:                              OPERATIVE REPORT   PREOPERATIVE DIAGNOSIS:  Displaced right femoral neck fracture.  POSTOPERATIVE DIAGNOSIS:  Displaced right femoral neck fracture.  PROCEDURE PERFORMED:  Right hip hemiarthroplasty using DePuy Tri-Lock prosthesis with a monopolar head.  ATTENDING SURGEON:  Doran Heater. Veverly Fells, M.D.  ASSISTANT:  Abbott Pao. Dixon, PA-C, who was scrubbed the entire procedure and necessary for satisfactory completion of surgery.  ANESTHESIA:  General anesthesia was used.  ESTIMATED BLOOD LOSS:  200 cc.  FLUID REPLACEMENT:  1200 cc crystalloids.  INSTRUMENT COUNTS:  Correct.  COMPLICATIONS:  There were no complications.  ANTIBIOTICS:  Perioperative antibiotics were given.  INDICATIONS:  The patient is an 81 year old female, who lives alone, who suffered a ground level fall at the house.  She complained of immediate severe pain in the hip, was unable to stand, presents to [facility] ED where x-rays demonstrated a displaced femoral neck fracture.  We counseled the patient regarding the need to perform hemiarthroplasty to restore proximal femoral anatomy and her ability to mobilize.  She agreed.  Risks and benefits of surgery were discussed.  Informed consent obtained.  DESCRIPTION OF PROCEDURE:  After an adequate level of anesthesia was achieved, the patient was positioned in the left lateral decubitus position with the right hip up.  Right hip was sterilely prepped and draped in usual manner.  All neurovascular structures were padded appropriately, down leg was padded and an axillary roll was utilized. After a sterile prep and drape in appropriate position, time-out was called.  We  then initiated surgery with the standard Kocher-Langenbeck posterior approach to the hip starting at the vastus ridge and extending back along the gluteus maximus fiber direction.  Dissection carried out down through subcutaneous tissues using Bovie, identified the tensor fascia lata, which was divided in line with the skin incision.  The gluteus medius was retracted.  The short external rotators were taken off the posterior aspect of the femur including the piriformis.  We then made our arthrotomy and tagged the capsule.  Fresh fracture hematoma was noted.  The fracture was displaced.  We went ahead and made our neck cut one fingerbreadth above the lesser trochanter with the oscillating saw, removing the bone.  We then removed her head and sized it to a 47 diameter monopolar head.  We removed the ligamentum teres, and then we went ahead and broached the femur.  This was a broach-only system for the DePuy Tri-Lock prosthesis.  We went up to a size 7.  I then trialed first a standard offset, then a high offset stem, and the high offset gave [place] better stability with the knee at 90-90.  We had a +0 neck taper and a 47 monopolar head.  We removed all trial components, irrigated thoroughly, then impacted the real DePuy Tri-Lock stem in place with appropriate anteversion on the stem about 20 degrees.  We  then selected our +0 neck taper and our 47 monopolar head and impacted that onto the trunnion.  Reduced the hip, had nice stability in all ranges of motion including no impingement in extension, abduction, and in flexion to 90 and internal rotation to 90 degrees with no instability.  We irrigated the hip thoroughly.  We then closed the capsule with interrupted Ethibond suture followed by closure of the tensor fascia lata with interrupted #1 Vicryl suture for the tendinous portion and then we ran the muscular portion fascia only with the #1 Vicryl, 2-0 Vicryl subcutaneous closure, and staples for  skin.  Sterile bandage was applied.  The patient tolerated the surgery well.     Doran Heater. Veverly Fells, M.D.     SRN/MEDQ  D:  12/24/2015  T:  12/25/2015  Job:  [id number]

## 2015-12-25 NOTE — Transfer of Care (Signed)
Immediate Anesthesia Transfer of Care Note  Patient: Angelica Bell  Procedure(s) Performed: Procedure(s): ARTHROPLASTY BIPOLAR HIP (HEMIARTHROPLASTY) (Right)  Patient Location: PACU  Anesthesia Type:General  Level of Consciousness:  sedated, patient cooperative and responds to stimulation  Airway & Oxygen Therapy:Patient Spontanous Breathing and Patient connected to face mask oxgen  Post-op Assessment:  Report given to PACU RN and Post -op Vital signs reviewed and stable  Post vital signs:  Reviewed and stable  Last Vitals:  Filed Vitals:   12/24/15 1943  BP: 123/77  Pulse: 90  Temp: 36.8 C  Resp: 19    Complications: No apparent anesthesia complications

## 2015-12-25 NOTE — Progress Notes (Signed)
Utilization review completed.  

## 2015-12-25 NOTE — Progress Notes (Signed)
PT Cancellation Note  Patient Details Name: Angelica Bell MRN: [id number] DOB: 10/13/1932   Cancelled Treatment:    Reason Eval/Treat Not Completed:  Order  States" start tomorrow". Spoke with patient and pain is too great  For any repositioning, sitting up. Will see in AM. Patient reports the pain meds are not controlling pain.  Claretha Cooper 12/25/2015, 2:51 PM Tresa Endo PT [phone]

## 2015-12-26 ENCOUNTER — Encounter (HOSPITAL_COMMUNITY): Payer: Self-pay | Admitting: Orthopedic Surgery

## 2015-12-26 LAB — CBC
HEMATOCRIT: 27.6 % — AB (ref 36.0–46.0)
HEMOGLOBIN: 9.2 g/dL — AB (ref 12.0–15.0)
MCH: 29.6 pg (ref 26.0–34.0)
MCHC: 33.3 g/dL (ref 30.0–36.0)
MCV: 88.7 fL (ref 78.0–100.0)
Platelets: 206 10*3/uL (ref 150–400)
RBC: 3.11 MIL/uL — ABNORMAL LOW (ref 3.87–5.11)
RDW: 13 % (ref 11.5–15.5)
WBC: 11.3 10*3/uL — AB (ref 4.0–10.5)

## 2015-12-26 LAB — BASIC METABOLIC PANEL
ANION GAP: 6 (ref 5–15)
BUN: 27 mg/dL — ABNORMAL HIGH (ref 6–20)
CHLORIDE: 105 mmol/L (ref 101–111)
CO2: 24 mmol/L (ref 22–32)
Calcium: 8.8 mg/dL — ABNORMAL LOW (ref 8.9–10.3)
Creatinine, Ser: 0.85 mg/dL (ref 0.44–1.00)
GFR calc non Af Amer: >60 mL/min (ref >60)
GLUCOSE: 126 mg/dL — AB (ref 65–99)
Potassium: 4.4 mmol/L (ref 3.5–5.1)
Sodium: 135 mmol/L (ref 135–145)

## 2015-12-26 LAB — SAMPLE TO BLOOD BANK

## 2015-12-26 NOTE — [place] FL2 (Signed)
[place] MEDICAID FL2 LEVEL OF CARE SCREENING TOOL     IDENTIFICATION  Patient Name: Angelica Bell Birthdate: 11-Jun-1932 Sex: female Admission Date (Current Location): 12/24/2015  [facility] and [place] Number:  Herbalist and Address:  [facility],  [place] [address], [place]      Provider Number: [id number]  Attending Physician Name and Address:  Velvet Bathe, MD  Relative Name and Phone Number:       Current Level of Care: Hospital Recommended Level of Care: [place] Prior Approval Number:    Date Approved/Denied:   PASRR Number:    Discharge Plan: SNF    Current Diagnoses: Patient Active Problem List   Diagnosis Date Noted  . Hip fracture, right ([place]) 12/25/2015  . Chronic pain 12/24/2015  . Fracture of femoral neck, right, closed 12/24/2015  . Essential hypertension 12/24/2015  . Community acquired pneumonia 04/15/2013  . Hypokalemia 04/15/2013  . Labial cyst 02/08/2012  . POSTMENOPAUSAL SYNDROME 11/28/2009  . PERIPHERAL NEUROPATHY, LOWER EXTREMITY, RIGHT 02/22/2009  . INSOMNIA-SLEEP DISORDER-UNSPEC 10/12/2008  . Lumbago 08/02/2008  . Narcolepsy without cataplexy 01/20/2008  . ANXIETY DEPRESSION 12/16/2007  . HYPERLIPIDEMIA 07/08/2007  . Osteoarthritis 07/08/2007  . Osteoporosis 07/08/2007  . SCOLIOSIS NEC 07/08/2007    Orientation RESPIRATION BLADDER Height & Weight    Self, Time, Situation, Place  Normal Continent 5' (152.4 cm) 120 lbs.  BEHAVIORAL SYMPTOMS/MOOD NEUROLOGICAL BOWEL NUTRITION STATUS  Other (Comment) (no behaviors)   Continent Diet  AMBULATORY STATUS COMMUNICATION OF NEEDS Skin   Limited Assist Verbally Surgical wounds                       Personal Care Assistance Level of Assistance  Bathing, Feeding, Dressing Bathing Assistance: Limited assistance Feeding assistance: Independent Dressing Assistance: Limited assistance     Functional Limitations Info  Sight, Hearing,  Speech Sight Info: Adequate Hearing Info: Adequate Speech Info: Adequate    SPECIAL CARE FACTORS FREQUENCY  PT (By licensed PT), OT (By licensed OT)     PT Frequency: 5 x wk OT Frequency: 5 x wk            Contractures Contractures Info: Not present    Additional Factors Info  Code Status Code Status Info: Full Code             Current Medications (12/26/2015):  This is the current hospital active medication list Current Facility-Administered Medications  Medication Dose Route Frequency Provider Last Rate Last Dose  . 0.9 %  sodium chloride infusion   Intravenous Continuous Netta Cedars, MD 50 mL/hr at 12/25/15 0139    . acetaminophen (TYLENOL) tablet 650 mg  650 mg Oral Q6H PRN Netta Cedars, MD   650 mg at 12/26/15 0031   Or  . acetaminophen (TYLENOL) suppository 650 mg  650 mg Rectal Q6H PRN Netta Cedars, MD      . acetaminophen (TYLENOL) tablet 650 mg  650 mg Oral Q6H PRN Edwin Dada, MD      . bisacodyl (DULCOLAX) suppository 10 mg  10 mg Rectal Daily PRN Netta Cedars, MD      . docusate sodium (COLACE) capsule 100 mg  100 mg Oral BID Netta Cedars, MD   100 mg at 12/26/15 0950  . enoxaparin (LOVENOX) injection 40 mg  40 mg Subcutaneous Daily Netta Cedars, MD   40 mg at 12/26/15 0949  . fentaNYL (DURAGESIC - dosed mcg/hr) 50 mcg  50 mcg Transdermal Q72H  Edwin Dada, MD   50 mcg at 12/25/15 0156  . ferrous sulfate tablet 325 mg  325 mg Oral TID PC Netta Cedars, MD   325 mg at 12/26/15 0836  . fluorometholone (FML) 0.1 % ophthalmic suspension 1 drop  1 drop Both Eyes Daily Netta Cedars, MD   1 drop at 12/26/15 0951  . HYDROmorphone (DILAUDID) injection 1 mg  1 mg Intravenous Q2H PRN Edwin Dada, MD      . HYDROmorphone (DILAUDID) injection 1 mg  1 mg Intravenous Q2H PRN Netta Cedars, MD   1 mg at 12/26/15 0203  . menthol-cetylpyridinium (CEPACOL) lozenge 3 mg  1 lozenge Oral PRN Netta Cedars, MD       Or  . phenol (CHLORASEPTIC) mouth spray  1 spray  1 spray Mouth/Throat PRN Netta Cedars, MD      . methocarbamol (ROBAXIN) tablet 500 mg  500 mg Oral Q6H PRN Netta Cedars, MD   500 mg at 12/26/15 0546   Or  . methocarbamol (ROBAXIN) 500 mg in dextrose 5 % 50 mL IVPB  500 mg Intravenous Q6H PRN Netta Cedars, MD      . metoCLOPramide (REGLAN) tablet 5-10 mg  5-10 mg Oral Q8H PRN Netta Cedars, MD       Or  . metoCLOPramide (REGLAN) injection 5-10 mg  5-10 mg Intravenous Q8H PRN Netta Cedars, MD      . multivitamin with minerals tablet 1 tablet  1 tablet Oral Daily Netta Cedars, MD   1 tablet at 12/26/15 0950  . ondansetron (ZOFRAN) tablet 4 mg  4 mg Oral Q6H PRN Netta Cedars, MD       Or  . ondansetron [facility]) injection 4 mg  4 mg Intravenous Q6H PRN Netta Cedars, MD      . oxyCODONE (Oxy IR/ROXICODONE) immediate release tablet 20 mg  20 mg Oral Q4H PRN Edwin Dada, MD   20 mg at 12/26/15 0950  . polyethylene glycol (MIRALAX / GLYCOLAX) packet 17 g  17 g Oral Daily PRN Netta Cedars, MD      . senna-docusate (Senokot-S) tablet 1 tablet  1 tablet Oral QHS PRN Edwin Dada, MD         Discharge Medications: Please see discharge summary for a list of discharge medications.  Relevant Imaging Results:  Relevant Lab Results:   Additional Information SS # [id number]  Avonte Sensabaugh, Randall An, LCSW

## 2015-12-26 NOTE — Care Management Note (Signed)
Case Management Note  Patient Details  Name: ESTAFANI EYERLY MRN: [id number] Date of Birth: 11-20-1932  Subjective/Objective:   S/p Right hip hemiarthroplasty                 Action/Plan: Discharge planning per CSW  Expected Discharge Date:  12/29/15               Expected Discharge Plan:  [place]  In-House Referral:  Clinical Social Work  Discharge planning Services  CM Consult  Post Acute Care Choice:  NA Choice offered to:  NA  DME Arranged:  N/A DME Agency:  NA  HH Arranged:  NA HH Agency:  NA  Status of Service:  Completed, signed off  Medicare Important Message Given:    Date Medicare IM Given:    Medicare IM give by:    Date Additional Medicare IM Given:    Additional Medicare Important Message give by:     If discussed at [place] of Stay Meetings, dates discussed:    Additional Comments:  Guadalupe Maple, RN 12/26/2015, 12:41 PM

## 2015-12-26 NOTE — Evaluation (Signed)
Physical Therapy Evaluation Patient Details Name: Angelica Bell MRN: [id number] DOB: Aug 14, 1932 Today's Date: 12/26/2015   History of Present Illness  s/p L hip hemiarthroplasty 12/26/ early AM.  Clinical Impression  Patient  Tolerated  OOB to recliner and back. C/o increased pain. R leg tends to internally rotate. Positioned  With pillow, Patient will benefit from PT to address problems listed in the note below to Dc to SNF.    Follow Up Recommendations SNF;Supervision/Assistance - 24 hour    Equipment Recommendations  None recommended by PT    Recommendations for Other Services       Precautions / Restrictions Precautions Precautions: Posterior Hip;Fall Required Braces or Orthoses: Knee Immobilizer - Right Knee Immobilizer - Right: Other (comment) (no orders for KI noted for wear time. removed for mobility.) Restrictions RLE Weight Bearing: Weight bearing as tolerated      Mobility  Bed Mobility Overal bed mobility: Needs Assistance;+2 for physical assistance Bed Mobility: Sit to Supine       Sit to supine: Max assist;+2 for physical assistance;+2 for safety/equipment   General bed mobility comments: assist fro trunk and R leg.  Transfers Overall transfer level: Needs assistance Equipment used: Rolling walker (2 wheeled) Transfers: Sit to/from [facility] Sit to Stand: Mod assist;+2 safety/equipment Stand pivot transfers: Mod assist;+2 safety/equipment       General transfer comment:  power up assist for standing  from recliner, cueas fopr hand an R leg position,  Ambulation/Gait Ambulation/Gait assistance: Mod assist;+2 physical assistance;+2 safety/equipment Ambulation Distance (Feet): 5 Feet Assistive device: Rolling walker (2 wheeled) Gait Pattern/deviations: Step-to pattern;Antalgic     General Gait Details: extra time for activity, R leg is internally rotating. had removed the  KI to allow knee flexion on R.  replaced after return to  supine.  Stairs            Wheelchair Mobility    Modified Rankin (Stroke Patients Only)       Balance Overall balance assessment: Needs assistance;History of Falls         Standing balance support: During functional activity;Bilateral upper extremity supported Standing balance-Leahy Scale: Poor                               Pertinent Vitals/Pain Pain Assessment: Faces Pain Score: 10-Worst pain ever Pain Location: R hip Pain Descriptors / Indicators: Aching;Discomfort;Crying;Guarding Pain Intervention(s): RN gave pain meds during session;Limited activity within patient's tolerance;Ice applied;Repositioned    Home Living Family/patient expects to be discharged to:: Skilled nursing facility Living Arrangements: Alone                    Prior Function Level of Independence: Independent               Hand Dominance        Extremity/Trunk Assessment   Upper Extremity Assessment: Defer to OT evaluation           Lower Extremity Assessment: RLE deficits/detail RLE Deficits / Details: leg in KI, internally rotated, propped in bed on pillow    Cervical / Trunk Assessment: Kyphotic  Communication   Communication: No difficulties  Cognition Arousal/Alertness: Awake/alert Behavior During Therapy: Anxious Overall Cognitive Status: Within Functional Limits for tasks assessed                      General Comments      Exercises  Assessment/Plan    PT Assessment Patient needs continued PT services  PT Diagnosis Difficulty walking;Acute pain   PT Problem List Decreased strength;Decreased range of motion;Decreased activity tolerance;Decreased balance;Decreased mobility;Decreased knowledge of precautions;Decreased safety awareness;Decreased knowledge of use of DME;Pain  PT Treatment Interventions DME instruction;Gait training;Functional mobility training;Therapeutic activities;Therapeutic exercise;Patient/family  education   PT Goals (Current goals can be found in the Care Plan section) Acute Rehab PT Goals Patient Stated Goal: to  get back home PT Goal Formulation: With patient Time For Goal Achievement: 01/09/16 Potential to Achieve Goals: Good    Frequency Min 3X/week   Barriers to discharge Decreased caregiver support      Co-evaluation               End of Session   Activity Tolerance: Patient limited by pain Patient left: in bed;with call bell/phone within reach;with bed alarm set Nurse Communication: Mobility status         Time: 1205-1230 PT Time Calculation (min) (ACUTE ONLY): 25 min   Charges:   PT Evaluation $Initial PT Evaluation Tier I: 1 Procedure PT Treatments $Gait Training: 8-22 mins   PT G Codes:        Claretha Cooper 12/26/2015, 12:49 PM Tresa Endo PT [phone]

## 2015-12-26 NOTE — Clinical Social Work Note (Signed)
Clinical Social Work Assessment  Patient Details  Name: Angelica Bell MRN: [id number] Date of Birth: March 23, 1932  Date of referral:  12/26/15               Reason for consult:  Discharge Planning                Permission sought to share information with:  Chartered certified accountant granted to share information::  Yes, Verbal Permission Granted  Name::        Agency::     Relationship::     Contact Information:     Housing/Transportation Living arrangements for the past 2 months:  Single Family Home Source of Information:  Patient Patient Interpreter Needed:  None Criminal Activity/Legal Involvement Pertinent to Current Situation/Hospitalization:  No - Comment as needed Significant Relationships:  Adult Children Lives with:  Self Do you feel safe going back to the place where you live?   (Pt / MD feels ST Rehab may be needed.) Need for family participation in patient care:  Yes (Comment)  Care giving concerns:  Pt's care may not be able to be managed at home following hospital d/c.   Social Worker assessment / plan:  Pt hospitalized on 12/23/15 with a fx of the femoral neck which required surgery. PT eval is pending. CSW met with pt to assist with d/c planning. Pt reports that she lives alone and may need ST Rehab placement. SNF search initiated and bed offers are pending. Pt has Angelica Bell which requires prior authorization. CSW will assist with authorization process once PT eval / recommendations are provided. Pt has given CSW permission to contact pt's daughter to assist with d/c planning.   Employment status:  Retired Forensic scientist:  Managed Care PT Recommendations:  Not assessed at this time Information / Referral to community resources:  [name]  Patient/Family's Response to care:  Pt feels she will need rehab at d/c.  Patient/Family's Understanding of and Emotional Response to Diagnosis, Current Treatment, and Prognosis:  Pt is  aware of her medical status. She has never been to [name] and appears anxious, " I don't want to go to a NH. ". CSW explained the difference between ST Rehab and NHP. CSW will provide ongoing support and reassurance.   Emotional Assessment Appearance:  Appears stated age Attitude/Demeanor/Rapport:  Other (cooperative) Affect (typically observed):  Anxious Orientation:  Oriented to Self, Oriented to Place, Oriented to  Time, Oriented to Situation Alcohol / Substance use:  Not Applicable Psych involvement (Current and /or in the community):  No (Comment)  Discharge Needs  Concerns to be addressed:  Discharge Planning Concerns Readmission within the last 30 days:  No Current discharge risk:  None Barriers to Discharge:  No Barriers Identified   Angelica Bell  [phone] 12/26/2015, 10:31 AM

## 2015-12-26 NOTE — Progress Notes (Signed)
OT Cancellation Note  Patient Details Name: Angelica Bell MRN: [id number] DOB: 1932-05-27   Cancelled Treatment:    Reason Eval/Treat Not Completed: Pain limiting ability to participate.  Checked on pt and she had too much pain to sit up again. Will likely check back tomorrow am.  Ellouise Mcwhirter 12/26/2015, 2:50 PM  Lesle Chris, OTR/L [phone] 12/26/2015

## 2015-12-26 NOTE — Clinical Social Work Placement (Signed)
   CLINICAL SOCIAL WORK PLACEMENT  NOTE  Date:  12/26/2015  Patient Details  Name: Angelica Bell MRN: [id number] Date of Birth: October 23, 1932  Clinical Social Work is seeking post-discharge placement for this patient at the [place] level of care (*CSW will initial, date and re-position this form in  chart as items are completed):  Yes   Patient/family provided with [facility] Work Department's list of facilities offering this level of care within the geographic area requested by the patient (or if unable, by the patient's family).  Yes   Patient/family informed of their freedom to choose among providers that offer the needed level of care, that participate in Medicare, Medicaid or managed care program needed by the patient, have an available bed and are willing to accept the patient.  Yes   Patient/family informed of [facility]'s ownership interest in [facility] and [facility], as well as of the fact that they are under no obligation to receive care at these facilities.  PASRR submitted to EDS on 12/26/15     PASRR number received on       Existing PASRR number confirmed on       FL2 transmitted to all facilities in geographic area requested by pt/family on       FL2 transmitted to all facilities within larger geographic area on       Patient informed that his/her managed care company has contracts with or will negotiate with certain facilities, including the following:        No   Patient/family informed of bed offers received.  Patient chooses bed at       Physician recommends and patient chooses bed at      Patient to be transferred to   on  .  Patient to be transferred to facility by       Patient family notified on   of transfer.  Name of family member notified:        PHYSICIAN       Additional Comment:    _______________________________________________ Luretha Rued, [place] 12/26/2015, 10:42 AM

## 2015-12-26 NOTE — Progress Notes (Signed)
[facility] PROGRESS NOTE  Angelica Bell [id number] DOB: 08-Mar-1932 DOA: 12/24/2015 PCP: Cindee Salt, MD  Brief Narrative:  Patient is a 81 y/o with history of chronic low back pain on high dose oral opiod therapy at home who presented to the hospital after a mechanical fall and was diagnosed with right femoral neck fracture  Assessment/Plan: Principal Problem:   Fracture of femoral neck, right, closed - Ortho on board and managing - will defer pain control to ortho: Currently on acetaminophen, dilaudid, fentanyl, and oxycodone  Active Problems:   Osteoarthritis   Osteoporosis   Chronic pain   Essential hypertension - stable currently off antihypertensives  Code Status: stable Family Communication: none at bedside Disposition Plan: pending recommendations from surgeon   Consultants:  Ortho  Procedures:  none  Antibiotics:  None  HPI/Subjective: Pt continues to complain of pain at RLE. No new complaints reported to me otherwise.  Objective: Filed Vitals:   12/25/15 2127 12/26/15 0629  BP: 95/61 92/76  Pulse: 80 86  Temp: 98.6 F (37 C) 97.8 F (36.6 C)  Resp: 17 17    Intake/Output Summary (Last 24 hours) at 12/26/15 1203 Last data filed at 12/26/15 1040  Gross per 24 hour  Intake 1191.67 ml  Output   1150 ml  Net  41.67 ml   Filed Weights   12/24/15 1943  Weight: 54.432 kg (120 lb)    Exam:   General:  Pt in nad, alert and awake  Cardiovascular: rrr, no mrg  Respiratory: cta bl, no wheezes  Abdomen: soft, nt, nd  Musculoskeletal: equal tone BL   Data Reviewed: Basic Metabolic Panel:  Recent Labs Lab 12/24/15 2030 12/25/15 0457 12/26/15 0546  NA 142 137 135  K 4.4 4.7 4.4  CL 105 107 105  CO2 29 23 24   GLUCOSE 114* 251* 126*  BUN 35* 34* 27*  CREATININE 1.20* 0.98 0.85  CALCIUM 9.8 8.5* 8.8*   Liver Function Tests: No results for input(s): AST, ALT, ALKPHOS, BILITOT, PROT, ALBUMIN in the last 168 hours. No  results for input(s): LIPASE, AMYLASE in the last 168 hours. No results for input(s): AMMONIA in the last 168 hours. CBC:  Recent Labs Lab 12/24/15 2030 12/25/15 0457 12/26/15 0546  WBC 13.6* 14.2* 11.3*  HGB 12.4 10.1* 9.2*  HCT 38.1 30.3* 27.6*  MCV 88.8 88.1 88.7  PLT 294 216 206   Cardiac Enzymes: No results for input(s): CKTOTAL, CKMB, CKMBINDEX, TROPONINI in the last 168 hours. BNP (last 3 results) No results for input(s): BNP in the last 8760 hours.  ProBNP (last 3 results) No results for input(s): PROBNP in the last 8760 hours.  CBG: No results for input(s): GLUCAP in the last 168 hours.  No results found for this or any previous visit (from the past 240 hour(s)).   Studies: Dg Chest 2 View  12/24/2015  CLINICAL DATA:  Preop chest x-ray.  Hip fracture. EXAM: CHEST  2 VIEW COMPARISON:  CT 05/09/2015, LEFT shoulder FINDINGS: Normal cardiac silhouette. No effusion, infiltrate, pneumothorax. Remote fracture of the LEFT humerus. Scoliosis. IMPRESSION: No active cardiopulmonary disease. Scoliosis Electronically Signed   By: Suzy Bouchard M.D.   On: 12/24/2015 22:00   Dg Pelvis 1-2 Views  12/25/2015  CLINICAL DATA:  Status post right hip replacement. Initial encounter. EXAM: PELVIS - 1-2 VIEW COMPARISON:  Right hip radiographs performed 12/24/2015 FINDINGS: There has been interval placement of a right hip hemiarthroplasty, without evidence of loosening or new fracture. Mild degenerative change  is noted at the pubic symphysis. Diffuse soft tissue air is noted overlying the right gluteus musculature. Overlying skin staples are seen. IMPRESSION: Interval placement of right hip hemiarthroplasty, without evidence of loosening or new fracture. Electronically Signed   By: Garald Balding M.D.   On: 12/25/2015 01:53   Dg Hip Unilat  With Pelvis 2-3 Views Right  12/24/2015  CLINICAL DATA:  Unwitnessed fall, right hip injury EXAM: DG HIP (WITH OR WITHOUT PELVIS) 2-3V RIGHT COMPARISON:   None. FINDINGS: There is acute proximal right femoral neck fracture with slight impaction. Bones are osteopenic. Bony pelvis and left hip intact. Degenerative changes of the lower lumbar spine with an associated levoscoliosis. Mild SI joint arthropathy. Normal bowel gas pattern. IMPRESSION: Acute proximal right femoral neck fracture with slight impaction. Electronically Signed   By: Jerilynn Mages.  Shick M.D.   On: 12/24/2015 20:25    Scheduled Meds: . docusate sodium  100 mg Oral BID  . enoxaparin (LOVENOX) injection  40 mg Subcutaneous Daily  . fentaNYL  50 mcg Transdermal Q72H  . ferrous sulfate  325 mg Oral TID PC  . fluorometholone  1 drop Both Eyes Daily  . multivitamin with minerals  1 tablet Oral Daily   Continuous Infusions: . sodium chloride 50 mL/hr at 12/25/15 0139    Time spent: > 15 minutes  Velvet Bathe  [facility] Pager [phone]. If 7PM-7AM, please contact night-coverage at [email or web address], password [facility] 12/26/2015, 12:03 PM  LOS: 2 days

## 2015-12-26 NOTE — Progress Notes (Signed)
   Subjective: 2 Days Post-Op Procedure(s) (LRB): ARTHROPLASTY BIPOLAR HIP (HEMIARTHROPLASTY) (Right)  C/o mild to moderate pain to left hip and leg Denies any new symptoms or issues Patient reports pain as moderate.  Objective:   VITALS:   Filed Vitals:   12/25/15 2127 12/26/15 0629  BP: 95/61 92/76  Pulse: 80 86  Temp: 98.6 F (37 C) 97.8 F (36.6 C)  Resp: 17 17    Left hip incision healing well nv intact distally No rashes Minimal edema distally  LABS  Recent Labs  12/24/15 2030 12/25/15 0457 12/26/15 0546  HGB 12.4 10.1* 9.2*  HCT 38.1 30.3* 27.6*  WBC 13.6* 14.2* 11.3*  PLT 294 216 206     Recent Labs  12/24/15 2030 12/25/15 0457 12/26/15 0546  NA 142 137 135  K 4.4 4.7 4.4  BUN 35* 34* 27*  CREATININE 1.20* 0.98 0.85  GLUCOSE 114* 251* 126*     Assessment/Plan: 2 Days Post-Op Procedure(s) (LRB): ARTHROPLASTY BIPOLAR HIP (HEMIARTHROPLASTY) (Right) Continue PT/OT Agree with SNF placement Pulmonary toilet Pain management as needed    Merla Riches, MPAS, PA-C  12/26/2015, 2:05 PM

## 2015-12-27 DIAGNOSIS — D62 Acute posthemorrhagic anemia: Secondary | ICD-10-CM

## 2015-12-27 DIAGNOSIS — S72001D Fracture of unspecified part of neck of right femur, subsequent encounter for closed fracture with routine healing: Secondary | ICD-10-CM

## 2015-12-27 LAB — CBC
HCT: 27.1 % — ABNORMAL LOW (ref 36.0–46.0)
Hemoglobin: 9 g/dL — ABNORMAL LOW (ref 12.0–15.0)
MCH: 29.3 pg (ref 26.0–34.0)
MCHC: 33.2 g/dL (ref 30.0–36.0)
MCV: 88.3 fL (ref 78.0–100.0)
PLATELETS: 195 10*3/uL (ref 150–400)
RBC: 3.07 MIL/uL — ABNORMAL LOW (ref 3.87–5.11)
RDW: 12.8 % (ref 11.5–15.5)
WBC: 11.9 10*3/uL — ABNORMAL HIGH (ref 4.0–10.5)

## 2015-12-27 MED ORDER — OXYCODONE HCL 30 MG PO TABS: 30.0000 mg | ORAL_TABLET | Freq: Four times a day (QID) | ORAL | Status: DC | PRN

## 2015-12-27 MED ORDER — ENOXAPARIN SODIUM 40 MG/0.4ML ~~LOC~~ SOLN: 40.0000 mg | Freq: Every day | SUBCUTANEOUS | Status: DC

## 2015-12-27 MED ORDER — FENTANYL 50 MCG/HR TD PT72: 50.0000 ug | MEDICATED_PATCH | TRANSDERMAL | Status: DC

## 2015-12-27 MED ORDER — METHOCARBAMOL 500 MG PO TABS: 500.0000 mg | ORAL_TABLET | Freq: Four times a day (QID) | ORAL | Status: DC | PRN

## 2015-12-27 NOTE — Progress Notes (Signed)
BSE completed, full report to follow.   Pt has h/o chronic suspected esophageal dysphagia for which she manages.  SlP provided education re: esophageal precautions and xerostomia tips.  Advised pt to monitor swallow closely and follow up with GI if problems worsen.  Luanna Salk, [place] [facility] SLP [phone]

## 2015-12-27 NOTE — Evaluation (Signed)
Clinical/Bedside Swallow Evaluation Patient Details  Name: Angelica Bell MRN: [id number] Date of Birth: 05-01-1932  Today's Date: 12/27/2015 Time: SLP Start Time (ACUTE ONLY): 1210 SLP Stop Time (ACUTE ONLY): 1230 SLP Time Calculation (min) (ACUTE ONLY): 20 min  Past Medical History:  Past Medical History  Diagnosis Date  . Osteoporosis   . Pneumonia   . Chronic pain disorder     with narcotic management  . Hypertension   . Chronic back pain     with degenerative joint disease  . PE (pulmonary embolism)     After hysterectomy in 1967  . Dry eyes    Past Surgical History:  Past Surgical History  Procedure Laterality Date  . Appendectomy    . Abdominal hysterectomy    . Tonsillectomy    . Breast enhancement surgery    . Hip arthroplasty Right 12/24/2015    Procedure: ARTHROPLASTY BIPOLAR HIP (HEMIARTHROPLASTY);  Surgeon: Netta Cedars, MD;  Location: [facility];  Service: Orthopedics;  Laterality: Right;   HPI:  81 yo female adm to [facility] with fall and femur fx - s/p surgery.  PMH + for pain, pna in 2014, HLD, HTN, ? dysphagia.  Swallow evaluation ordered.  Pt report she has previously undergone endoscopy but she had a bad experience.  She further states that surgical treatment was not helpful.     Assessment / Plan / Recommendation Clinical Impression  Pt with negative CN exam and reported symptom sound consistent with esophageal deficits.  Pt observed consuming a few boluses of water, tuna and bread.  No indications of oropharyngeal dysphagia nor complaint of residuals, esophageal deficits during evaluation.  Pt consumes liquids with meals and eats small amounts at a time.  Pt does report previously undergoing endsocopy many years ago and states this procedure did not improve her dysphagia. Food and liquid feel as if they lodge on a "shelf" in the throat requiring time to clear per pt.  She also admits at times she forces herself to bring up food with frothy secretions.  SLP suspects  referrant sensation from esophagus due to vagus nerve path.  Pt denies unintentional weight loss nor pulmonary infections and states dysphagia is not worse now than previously.  Provided her with education to mitigate her symptoms and advised her to monitor  - seeking follow up with GI if indicated.  Xerostomia also reported by pt.     Aspiration Risk  Mild aspiration risk    Diet Recommendation Regular;Thin liquid   Liquid Administration via: Cup;Straw Medication Administration: Whole meds with liquid Supervision: Patient able to self feed Postural Changes: Seated upright at 90 degrees;Remain upright for at least 30 minutes after po intake    Other  Recommendations Oral Care Recommendations: Oral care BID   Follow up Recommendations    n/a   Frequency and Duration     n/a       Prognosis        Swallow Study   General Date of Onset: 12/27/15 HPI: 81 yo female adm to [facility] with fall and femur fx - s/p surgery.  PMH + for pain, pna in 2014, HLD, HTN, ? dysphagia.  Swallow evaluation ordered.  Pt report she has previously undergone endoscopy but she had a bad experience.  She further states that surgical treatment was not helpful.   Type of Study: Bedside Swallow Evaluation Diet Prior to this Study: Regular;Thin liquids Temperature Spikes Noted: No Respiratory Status: Room air History of Recent Intubation:  (for surgery) Behavior/Cognition:  Alert;Cooperative Oral Care Completed by SLP: No Oral Cavity - Dentition: Dentures, top;Dentures, bottom Vision: Functional for self-feeding Self-Feeding Abilities: Able to feed self Patient Positioning: Upright in bed Baseline Vocal Quality: Normal Volitional Cough: Strong Volitional Swallow: Able to elicit    Oral/Motor/Sensory Function Overall Oral Motor/Sensory Function: Within functional limits   Ice Chips Ice chips: Not tested   Thin Liquid Thin Liquid: Within functional limits Presentation: Straw    Nectar Thick Nectar Thick  Liquid: Not tested   Honey Thick Honey Thick Liquid: Not tested   Puree Puree: Not tested   Solid Solid: Within functional limits Presentation: [place], [place], [place] [facility] SLP [phone]

## 2015-12-27 NOTE — Care Management Important Message (Signed)
Important Message  Patient Details  Name: Angelica Bell MRN: [id number] Date of Birth: Apr 23, 1932   Medicare Important Message Given:  Yes    [name] 12/27/2015, 12:30 PMImportant Message  Patient Details  Name: Angelica Bell MRN: [id number] Date of Birth: 1932/04/06   Medicare Important Message Given:  Yes    [name] 12/27/2015, 12:30 PM

## 2015-12-27 NOTE — Evaluation (Addendum)
Occupational Therapy Evaluation Patient Details Name: Angelica Bell MRN: [id number] DOB: [birth year] Today's Date: 12/27/2015    History of Present Illness s/p L hip hemiarthroplasty 12/26/ early AM.   Clinical Impression   This 81 year old female was admitted for the above surgery.  She will benefit from skilled OT to increase safety and independence with adls.  Pt was independent prior to admission.  Pt currently needs up to total A. Goals in acute are for min to mod A. She will benefit from continued OT at SNF    Follow Up Recommendations  SNF    Equipment Recommendations  3 in 1 bedside comode    Recommendations for Other Services       Precautions / Restrictions Precautions Precautions: Posterior Hip;Fall Knee Immobilizer - Right:  (no order but in room) Restrictions RLE Weight Bearing: Weight bearing as tolerated      Mobility Bed Mobility         Supine to sit: Max assist Sit to supine: Max assist   General bed mobility comments: utilized pad and pt used trapeze to move over in bed.  Assist for legs and trunk  Transfers   Equipment used: Rolling walker (2 wheeled)   Sit to Stand: Mod assist Stand pivot transfers: Mod assist       General transfer comment: assist to rise and stabilize.  Cues for UE/LE placement and precautions    Balance           Standing balance support: During functional activity Standing balance-Leahy Scale: Poor                              ADL Overall ADL's : Needs assistance/impaired     Grooming: Set up;Supervision/safety;Sitting   Upper Body Bathing: Supervision/ safety;Set up;Sitting   Lower Body Bathing: Maximal assistance;Sit to/from stand   Upper Body Dressing : Minimal assistance;Sitting   Lower Body Dressing: Total assistance;Sit to/from stand   Toilet Transfer: Moderate assistance;Stand-pivot;BSC   Toileting- Clothing Manipulation and Hygiene: Maximal assistance;Sit to/from stand          General ADL Comments: transferred to 3:1 commode--pt unsucessful at emptying bladder.  Performed ADL from commode.  Educated on THPs (posterior).  Did not introduce AE on this visit as pt was uncomfortable.  Needs max cues.  Tends to IR and lean forward     Vision     Perception     Praxis      Pertinent Vitals/Pain Pain Assessment: Faces Faces Pain Scale: Hurts little more Pain Location: R hip Pain Descriptors / Indicators: Aching Pain Intervention(s): Limited activity within patient's tolerance;Monitored during session;Premedicated before session;Repositioned;Ice applied     Hand Dominance     Extremity/Trunk Assessment Upper Extremity Assessment Upper Extremity Assessment: Generalized weakness (shoulders painful; able to lift to at least 90)       Cervical / Trunk Assessment Cervical / Trunk Assessment: Kyphotic;Other exceptions (trunk rotated)   Communication Communication Communication: HOH   Cognition Arousal/Alertness: Awake/alert Behavior During Therapy: Anxious Overall Cognitive Status: Within Functional Limits for tasks assessed                     General Comments       Exercises       Shoulder Instructions      Home Living Family/patient expects to be discharged to:: Skilled nursing facility  Prior Functioning/Environment Level of Independence: Independent             OT Diagnosis: Acute pain;Generalized weakness   OT Problem List: Decreased strength;Decreased activity tolerance;Decreased knowledge of use of DME or AE;Decreased knowledge of precautions;Pain   OT Treatment/Interventions: Self-care/ADL training;DME and/or AE instruction;Patient/family education;Therapeutic activities;Balance training    OT Goals(Current goals can be found in the care plan section) Acute Rehab OT Goals Patient Stated Goal: to  get back home OT Goal Formulation: With patient Time For Goal  Achievement: 01/03/16 Potential to Achieve Goals: Good ADL Goals Pt Will Perform Lower Body Bathing: with min assist;with adaptive equipment;sit to/from stand Pt Will Perform Lower Body Dressing: with adaptive equipment;sit to/from stand;with mod assist Pt Will Transfer to Toilet: with min assist;bedside commode;stand pivot transfer Pt Will Perform Toileting - Clothing Manipulation and hygiene: with mod assist;sit to/from stand Additional ADL Goal #1: pt will recall 3/3 posterior thps  OT Frequency: Min 2X/week   Barriers to D/C:            Co-evaluation              End of Session    Activity Tolerance: Patient limited by fatigue;Patient limited by pain Patient left: in bed;with call bell/phone within reach;with bed alarm set   Time: 1123-1204 OT Time Calculation (min): 41 min Charges:  OT General Charges $OT Visit: 1 Procedure OT Evaluation $Initial OT Evaluation Tier I: 1 Procedure OT Treatments $Self Care/Home Management : 8-22 mins $Therapeutic Activity: 8-22 mins G-Codes:    Iyona Pehrson 02-Jan-2016, 1:59 PM   Lesle Chris, OTR/L [phone] 02-Jan-2016

## 2015-12-27 NOTE — NC FL2 (Signed)
[place] MEDICAID FL2 LEVEL OF CARE SCREENING TOOL     IDENTIFICATION  Patient Name: Angelica Bell Birthdate: 04/28/1932 Sex: female Admission Date (Current Location): 12/24/2015  [facility] and [place] Number:  Herbalist and Address:  [facility],  [place] [address] [place]      Provider Number: [id number]  Attending Physician Name and Address:  Modena Jansky, MD  Relative Name and Phone Number:       Current Level of Care: Hospital Recommended Level of Care: [place] Prior Approval Number:    Date Approved/Denied:   PASRR Number:    Discharge Plan: SNF    Current Diagnoses: Patient Active Problem List   Diagnosis Date Noted  . Hip fracture, right ([place]) 12/25/2015  . Chronic pain 12/24/2015  . Fracture of femoral neck, right, closed 12/24/2015  . Essential hypertension 12/24/2015  . Community acquired pneumonia 04/15/2013  . Hypokalemia 04/15/2013  . Labial cyst 02/08/2012  . POSTMENOPAUSAL SYNDROME 11/28/2009  . PERIPHERAL NEUROPATHY, LOWER EXTREMITY, RIGHT 02/22/2009  . INSOMNIA-SLEEP DISORDER-UNSPEC 10/12/2008  . Lumbago 08/02/2008  . Narcolepsy without cataplexy 01/20/2008  . ANXIETY DEPRESSION 12/16/2007  . HYPERLIPIDEMIA 07/08/2007  . Osteoarthritis 07/08/2007  . Osteoporosis 07/08/2007  . SCOLIOSIS NEC 07/08/2007    Orientation RESPIRATION BLADDER Height & Weight    Self, Time, Situation, Place  Normal Continent 5' (152.4 cm) 120 lbs.  BEHAVIORAL SYMPTOMS/MOOD NEUROLOGICAL BOWEL NUTRITION STATUS  Other (Comment) (no behaviors)   Continent Diet  AMBULATORY STATUS COMMUNICATION OF NEEDS Skin   Limited Assist Verbally Surgical wounds                       Personal Care Assistance Level of Assistance  Bathing, Feeding, Dressing Bathing Assistance: Limited assistance Feeding assistance: Independent Dressing Assistance: Limited assistance     Functional Limitations Info  Sight, Hearing,  Speech Sight Info: Adequate Hearing Info: Adequate Speech Info: Adequate    SPECIAL CARE FACTORS FREQUENCY  PT (By licensed PT), OT (By licensed OT)     PT Frequency: 5 x wk OT Frequency: 5 x wk            Contractures Contractures Info: Not present    Additional Factors Info  Code Status Code Status Info: Full Code             Current Medications (12/27/2015):  This is the current hospital active medication list Current Facility-Administered Medications  Medication Dose Route Frequency Provider Last Rate Last Dose  . 0.9 %  sodium chloride infusion   Intravenous Continuous Netta Cedars, MD 50 mL/hr at 12/25/15 0139    . acetaminophen (TYLENOL) tablet 650 mg  650 mg Oral Q6H PRN Netta Cedars, MD   650 mg at 12/26/15 0031   Or  . acetaminophen (TYLENOL) suppository 650 mg  650 mg Rectal Q6H PRN Netta Cedars, MD      . acetaminophen (TYLENOL) tablet 650 mg  650 mg Oral Q6H PRN Edwin Dada, MD      . bisacodyl (DULCOLAX) suppository 10 mg  10 mg Rectal Daily PRN Netta Cedars, MD      . docusate sodium (COLACE) capsule 100 mg  100 mg Oral BID Netta Cedars, MD   100 mg at 12/26/15 2055  . enoxaparin (LOVENOX) injection 40 mg  40 mg Subcutaneous Daily Netta Cedars, MD   40 mg at 12/26/15 0949  . fentaNYL (DURAGESIC - dosed mcg/hr) 50 mcg  50 mcg Transdermal  Q72H Edwin Dada, MD   50 mcg at 12/25/15 0156  . ferrous sulfate tablet 325 mg  325 mg Oral TID PC Netta Cedars, MD   325 mg at 12/26/15 1837  . fluorometholone (FML) 0.1 % ophthalmic suspension 1 drop  1 drop Both Eyes Daily Netta Cedars, MD   1 drop at 12/26/15 0951  . HYDROmorphone (DILAUDID) injection 1 mg  1 mg Intravenous Q2H PRN Edwin Dada, MD      . HYDROmorphone (DILAUDID) injection 1 mg  1 mg Intravenous Q2H PRN Netta Cedars, MD   1 mg at 12/26/15 2055  . menthol-cetylpyridinium (CEPACOL) lozenge 3 mg  1 lozenge Oral PRN Netta Cedars, MD       Or  . phenol [facility]) mouth spray  1 spray  1 spray Mouth/Throat PRN Netta Cedars, MD      . methocarbamol (ROBAXIN) tablet 500 mg  500 mg Oral Q6H PRN Netta Cedars, MD   500 mg at 12/27/15 0036   Or  . methocarbamol (ROBAXIN) 500 mg in dextrose 5 % 50 mL IVPB  500 mg Intravenous Q6H PRN Netta Cedars, MD      . metoCLOPramide (REGLAN) tablet 5-10 mg  5-10 mg Oral Q8H PRN Netta Cedars, MD       Or  . metoCLOPramide (REGLAN) injection 5-10 mg  5-10 mg Intravenous Q8H PRN Netta Cedars, MD      . multivitamin with minerals tablet 1 tablet  1 tablet Oral Daily Netta Cedars, MD   1 tablet at 12/26/15 0950  . ondansetron (ZOFRAN) tablet 4 mg  4 mg Oral Q6H PRN Netta Cedars, MD       Or  . ondansetron [facility]) injection 4 mg  4 mg Intravenous Q6H PRN Netta Cedars, MD      . oxyCODONE (Oxy IR/ROXICODONE) immediate release tablet 20 mg  20 mg Oral Q4H PRN Edwin Dada, MD   20 mg at 12/27/15 0507  . polyethylene glycol (MIRALAX / GLYCOLAX) packet 17 g  17 g Oral Daily PRN Netta Cedars, MD      . senna-docusate (Senokot-S) tablet 1 tablet  1 tablet Oral QHS PRN Edwin Dada, MD         Discharge Medications: Please see discharge summary for a list of discharge medications.  Relevant Imaging Results:  Relevant Lab Results:   Additional Information SS # [id number]  Tasfia Vasseur, Randall An, LCSW

## 2015-12-27 NOTE — Discharge Instructions (Signed)
Weight bearing as tolerated on the right LE  Keep incision covered and clean and dry for one week, then ok to get wet in the shower.  Posterior Total Hip Precautions  Follow up with Dr Veverly Fells in the office in two weeks  [phone]  Lovenox for 30 days

## 2015-12-27 NOTE — Clinical Social Work Placement (Signed)
   CLINICAL SOCIAL WORK PLACEMENT  NOTE  Date:  12/27/2015  Patient Details  Name: Angelica Bell MRN: [id number] Date of Birth: [birth year]  Clinical Social Work is seeking post-discharge placement for this patient at the [place] level of care (*CSW will initial, date and re-position this form in  chart as items are completed):  Yes   Patient/family provided with [place] Work Department's list of facilities offering this level of care within the geographic area requested by the patient (or if unable, by the patient's family).  Yes   Patient/family informed of their freedom to choose among providers that offer the needed level of care, that participate in Medicare, Medicaid or managed care program needed by the patient, have an available bed and are willing to accept the patient.  Yes   Patient/family informed of [place]'s ownership interest in [facility] and [facility], as well as of the fact that they are under no obligation to receive care at these facilities.  PASRR submitted to EDS on 12/26/15     PASRR number received on 12/26/15     Existing PASRR number confirmed on       FL2 transmitted to all facilities in geographic area requested by pt/family on 12/27/15     FL2 transmitted to all facilities within larger geographic area on       Patient informed that his/her managed care company has contracts with or will negotiate with certain facilities, including the following:        Yes   Patient/family informed of bed offers received.  Patient chooses bed at [facility]     Physician recommends and patient chooses bed at      Patient to be [facility] on 12/27/15.  Patient to be transferred to facility by [facility]     Patient family notified on 12/27/15 of transfer.  Name of family member notified:  Daughter     PHYSICIAN       Additional Comment: Pt / daughter are in agreement with d/c to [facility]  today. [facility] transport required. Medical necessity form completed. [employer] has provided authorization for SNF placement. Amb[facility] has been requested. Nsg reviewed d/c summary, scripts, avs. Scripts included in d/c packet. D/C Summary sent to SNF for review prior to d/c.     _______________________________________________ Luretha Rued, [place] 12/27/2015, 3:05 PM

## 2015-12-27 NOTE — Discharge Summary (Signed)
Physician Discharge Summary  Angelica Bell [id number] DOB: [birth year] DOA: 12/24/2015  PCP: Cindee Salt, MD  Admit date: 12/24/2015 Discharge date: 12/27/2015  Time spent: Greater than 30 minutes  Recommendations for Outpatient Follow-up:  1. M.D. at SNF in 3-5 days with repeat labs (CBC & BMP). 2. Dr. Netta Cedars, Orthopedics in 2 weeks. SNF to arrange follow-up appointment. 3. Dr. Nelva Bush, Pain management: Upon discharge from SNF or earlier if assistance is needed for pain management needs.  Discharge Diagnoses:  Principal Problem:   Fracture of femoral neck, right, closed Active Problems:   Osteoarthritis   Osteoporosis   Chronic pain   Essential hypertension   Hip fracture, right ([place])   Discharge Condition: Improved & Stable  Diet recommendation: Heart healthy diet.  Filed Weights   12/24/15 1943  Weight: 54.432 kg (120 lb)    History of present illness:  81 year old female patient with history of HTN, chronic pain on high-dose oral opioid therapy-follows with Dr. Nelva Bush (pain management), chronic dysphagia, presented with right hip pain after a mechanical fall. In the ED, x-rays revealed small right femoral neck fracture. Orthopedics was consulted. Hospitalist admission was requested.  Hospital Course:   Displaced right femoral neck fracture - Sustained status post mechanical fall. - After preop clearance, patient underwent surgery (right hip hemiarthroplasty using DePuy tri-lock prosthesis with the monopolar head) by orthopedics on 12/24/15 - Discussed with orthopedics team who have seen patient today and have cleared her for discharge to SNF. Weightbearing as tolerated on right lower extremity. Lovenox for DVT prophylaxis for 30 days postoperatively then may discontinue. Outpatient follow-up with Dr. Veverly Fells in 2 weeks.  Postop acute blood loss anemia - Hemoglobin stable in the 9 g/dL range over the last 48 hours. Outpatient follow-up.  Essential  hypertension - Patient claimed that she was recently diagnosed with hypertension and was started on lisinopril/HCTZ which she did not tolerate and was discontinued. Currently controlled off of medications. Monitor.  Acute kidney injury - Admitted with creatinine of 1.2. Likely prerenal. Resolved.  Chronic pain - Follows with Dr.Ramos as outpatient. Discussed with orthopedic team and patient will be discharged on medications as below. Outpatient follow-up with pain management.  Chronic suspected esophageal dysphagia - Patient reported almost a year long history of chronic swallowing issues. Speech therapy was consulted and indicated that she has history of chronic suspected esophageal dysphagia for which she manages. Speech therapist provided education regarding esophageal precautions and xerostomia tips. Monitor closely and consider outpatient GI evaluation as deemed necessary.  Consultations:  Orthopedics  Procedures:  right hip hemiarthroplasty using DePuy tri-lock prosthesis with the monopolar head on 12/24/15 by Dr. Netta Cedars, orthopedics   Foley catheter-discontinued 12/28   Discharge Exam:  Complaints:  Right hip/waist pain-rated intermittently as 7/10 in severity. Chronic swallowing issues. Denies dyspnea, cough or chest pain. As per RN, no acute issues.  Filed Vitals:   12/26/15 0630 12/26/15 1517 12/26/15 2148 12/27/15 0456  BP:  121/57 103/58 133/56  Pulse:  94 85 89  Temp:  99.6 F (37.6 C) 100.1 F (37.8 C) 98.9 F (37.2 C)  TempSrc:  Oral Oral Oral  Resp:  18 16 16   Height:      Weight:      SpO2: 96% 95% 97% 94%    General exam: Moderately built and nourished pleasant elderly female sitting up propped up in bed without distress this morning. Respiratory system: Clear. No increased work of breathing. Cardiovascular system: S1 & S2 heard, RRR. No JVD,  murmurs, gallops, clicks or pedal edema. Gastrointestinal system: Abdomen is nondistended, soft and  nontender. Normal bowel sounds heard. Central nervous system: Alert and oriented. No focal neurological deficits. Extremities: Symmetric 5 x 5 power. Right lower extremity movements assessment is limited secondary to recent hip surgery and associated pain. At least grade 3 x 5 power in that limb.  Discharge Instructions      Discharge Instructions    Call MD for:  difficulty breathing, headache or visual disturbances    Complete by:  As directed      Call MD for:  extreme fatigue    Complete by:  As directed      Call MD for:  persistant dizziness or light-headedness    Complete by:  As directed      Call MD for:  persistant nausea and vomiting    Complete by:  As directed      Call MD for:  redness, tenderness, or signs of infection (pain, swelling, redness, odor or green/yellow discharge around incision site)    Complete by:  As directed      Call MD for:  severe uncontrolled pain    Complete by:  As directed      Call MD for:  temperature >100.4    Complete by:  As directed      Diet - low sodium heart healthy    Complete by:  As directed      Increase activity slowly    Complete by:  As directed      Weight bearing as tolerated    Complete by:  As directed   Laterality:  right  Extremity:  Lower            Medication List    TAKE these medications        aspirin EC 81 MG tablet  Take 81 mg by mouth daily.     BIOTIN PO  Take 1 tablet by mouth daily.     enoxaparin 40 MG/0.4ML injection  Commonly known as:  LOVENOX  Inject 0.4 mLs (40 mg total) into the skin daily.     fentaNYL 50 MCG/HR  Commonly known as:  DURAGESIC - dosed mcg/hr  Place 1 patch (50 mcg total) onto the skin every 3 (three) days.     fluorometholone 0.1 % ophthalmic suspension  Commonly known as:  FML  Place 1 drop into both eyes daily.     HYDROcodone-acetaminophen 5-325 MG tablet  Commonly known as:  NORCO  Take 1 tablet by mouth every 6 (six) hours as needed for moderate pain.      methocarbamol 500 MG tablet  Commonly known as:  ROBAXIN  Take 1 tablet (500 mg total) by mouth every 6 (six) hours as needed for muscle spasms.     multivitamin tablet  Take 1 tablet by mouth daily.     oxycodone 30 MG immediate release tablet  Commonly known as:  ROXICODONE  Take 1 tablet (30 mg total) by mouth 4 (four) times daily as needed (Severe pain.).       Follow-up Information    Follow up with NORRIS,STEVEN R, MD. Call in 2 weeks.   Specialty:  Orthopedic Surgery   Why:  [id number]   Contact information:   [address] [place] [address] [phone]        The results of significant diagnostics from this hospitalization (including imaging, microbiology, ancillary and laboratory) are listed below for reference.    Significant Diagnostic  Studies: Dg Chest 2 View  12/24/2015  CLINICAL DATA:  Preop chest x-ray.  Hip fracture. EXAM: CHEST  2 VIEW COMPARISON:  CT 05/09/2015, LEFT shoulder FINDINGS: Normal cardiac silhouette. No effusion, infiltrate, pneumothorax. Remote fracture of the LEFT humerus. Scoliosis. IMPRESSION: No active cardiopulmonary disease. Scoliosis Electronically Signed   By: Suzy Bouchard M.D.   On: 12/24/2015 22:00   Dg Pelvis 1-2 Views  12/25/2015  CLINICAL DATA:  Status post right hip replacement. Initial encounter. EXAM: PELVIS - 1-2 VIEW COMPARISON:  Right hip radiographs performed 12/24/2015 FINDINGS: There has been interval placement of a right hip hemiarthroplasty, without evidence of loosening or new fracture. Mild degenerative change is noted at the pubic symphysis. Diffuse soft tissue air is noted overlying the right gluteus musculature. Overlying skin staples are seen. IMPRESSION: Interval placement of right hip hemiarthroplasty, without evidence of loosening or new fracture. Electronically Signed   By: Garald Balding M.D.   On: 12/25/2015 01:53   Dg Hip Unilat  With Pelvis 2-3 Views Right  12/24/2015  CLINICAL DATA:   Unwitnessed fall, right hip injury EXAM: DG HIP (WITH OR WITHOUT PELVIS) 2-3V RIGHT COMPARISON:  None. FINDINGS: There is acute proximal right femoral neck fracture with slight impaction. Bones are osteopenic. Bony pelvis and left hip intact. Degenerative changes of the lower lumbar spine with an associated levoscoliosis. Mild SI joint arthropathy. Normal bowel gas pattern. IMPRESSION: Acute proximal right femoral neck fracture with slight impaction. Electronically Signed   By: Jerilynn Mages.  Shick M.D.   On: 12/24/2015 20:25    Microbiology: No results found for this or any previous visit (from the past 240 hour(s)).   Labs: Basic Metabolic Panel:  Recent Labs Lab 12/24/15 2030 12/25/15 0457 12/26/15 0546  NA 142 137 135  K 4.4 4.7 4.4  CL 105 107 105  CO2 29 23 24   GLUCOSE 114* 251* 126*  BUN 35* 34* 27*  CREATININE 1.20* 0.98 0.85  CALCIUM 9.8 8.5* 8.8*   Liver Function Tests: No results for input(s): AST, ALT, ALKPHOS, BILITOT, PROT, ALBUMIN in the last 168 hours. No results for input(s): LIPASE, AMYLASE in the last 168 hours. No results for input(s): AMMONIA in the last 168 hours. CBC:  Recent Labs Lab 12/24/15 2030 12/25/15 0457 12/26/15 0546 12/27/15 0435  WBC 13.6* 14.2* 11.3* 11.9*  HGB 12.4 10.1* 9.2* 9.0*  HCT 38.1 30.3* 27.6* 27.1*  MCV 88.8 88.1 88.7 88.3  PLT 294 216 206 195   Cardiac Enzymes: No results for input(s): CKTOTAL, CKMB, CKMBINDEX, TROPONINI in the last 168 hours. BNP: BNP (last 3 results) No results for input(s): BNP in the last 8760 hours.  ProBNP (last 3 results) No results for input(s): PROBNP in the last 8760 hours.  CBG: No results for input(s): GLUCAP in the last 168 hours.      Signed:  Vernell Leep, MD, FACP, FHM. [facility] Pager [phone]  If 7PM-7AM, please contact night-coverage [email or web address] Password [email or web address] 12/27/2015, 1:12 PM

## 2015-12-27 NOTE — Progress Notes (Signed)
   Subjective: 3 Days Post-Op Procedure(s) (LRB): ARTHROPLASTY BIPOLAR HIP (HEMIARTHROPLASTY) (Right)  Pt c/o mild to moderate pain in right hip with decreased sensation to right lower leg mainly along medial aspect of thigh Also c/o burning pain in right foot Current patient with Dr. Nelva Bush for pain management due to chronic lumbar degenerative disc disease Plan for d/c to SNF today Patient reports pain as moderate.  Objective:   VITALS:   Filed Vitals:   12/26/15 2148 12/27/15 0456  BP: 103/58 133/56  Pulse: 85 89  Temp: 100.1 F (37.8 C) 98.9 F (37.2 C)  Resp: 16 16    Right hip incision healing well nv intact distally Decreased sensation to medial aspect of right thigh and calf Painful rom including log roll with right lower leg No rashes or edema  LABS  Recent Labs  12/25/15 0457 12/26/15 0546 12/27/15 0435  HGB 10.1* 9.2* 9.0*  HCT 30.3* 27.6* 27.1*  WBC 14.2* 11.3* 11.9*  PLT 216 206 195     Recent Labs  12/24/15 2030 12/25/15 0457 12/26/15 0546  NA 142 137 135  K 4.4 4.7 4.4  BUN 35* 34* 27*  CREATININE 1.20* 0.98 0.85  GLUCOSE 114* 251* 126*     Assessment/Plan: 3 Days Post-Op Procedure(s) (LRB): ARTHROPLASTY BIPOLAR HIP (HEMIARTHROPLASTY) (Right) Continue PT/OT Pt is cleared to d/c to SNF once bed available and medically cleared F/u in 2 weeks with Dr. Veverly Fells May weight bear as tolerated Encourage mobilization   Kellogg, Warner, PA-C  12/27/2015, 9:42 AM

## 2015-12-28 ENCOUNTER — Non-Acute Institutional Stay (SKILLED_NURSING_FACILITY): Payer: Medicare Other | Admitting: Adult Health

## 2015-12-28 ENCOUNTER — Encounter: Payer: Self-pay | Admitting: Adult Health

## 2015-12-28 ENCOUNTER — Other Ambulatory Visit: Payer: Self-pay

## 2015-12-28 DIAGNOSIS — I10 Essential (primary) hypertension: Secondary | ICD-10-CM | POA: Diagnosis not present

## 2015-12-28 DIAGNOSIS — D62 Acute posthemorrhagic anemia: Secondary | ICD-10-CM | POA: Diagnosis not present

## 2015-12-28 DIAGNOSIS — G8929 Other chronic pain: Secondary | ICD-10-CM | POA: Diagnosis not present

## 2015-12-28 DIAGNOSIS — D72829 Elevated white blood cell count, unspecified: Secondary | ICD-10-CM | POA: Diagnosis not present

## 2015-12-28 DIAGNOSIS — S72001S Fracture of unspecified part of neck of right femur, sequela: Secondary | ICD-10-CM

## 2015-12-28 MED ORDER — HYDROCODONE-ACETAMINOPHEN 5-325 MG PO TABS: ORAL_TABLET | ORAL | Status: DC

## 2015-12-28 NOTE — Telephone Encounter (Signed)
Rx faxed to [facility] @ [phone], phone number [phone]  

## 2015-12-29 ENCOUNTER — Non-Acute Institutional Stay (SKILLED_NURSING_FACILITY): Payer: Medicare Other | Admitting: Internal Medicine

## 2015-12-29 ENCOUNTER — Other Ambulatory Visit: Payer: Self-pay | Admitting: *Deleted

## 2015-12-29 DIAGNOSIS — D62 Acute posthemorrhagic anemia: Secondary | ICD-10-CM

## 2015-12-29 DIAGNOSIS — D72829 Elevated white blood cell count, unspecified: Secondary | ICD-10-CM | POA: Diagnosis not present

## 2015-12-29 DIAGNOSIS — I10 Essential (primary) hypertension: Secondary | ICD-10-CM | POA: Diagnosis not present

## 2015-12-29 DIAGNOSIS — S72001D Fracture of unspecified part of neck of right femur, subsequent encounter for closed fracture with routine healing: Secondary | ICD-10-CM | POA: Diagnosis not present

## 2015-12-29 DIAGNOSIS — G8929 Other chronic pain: Secondary | ICD-10-CM | POA: Diagnosis not present

## 2015-12-29 DIAGNOSIS — R2681 Unsteadiness on feet: Secondary | ICD-10-CM | POA: Diagnosis not present

## 2015-12-29 MED ORDER — OXYCODONE HCL 30 MG PO TABS: ORAL_TABLET | ORAL | Status: DC

## 2015-12-29 NOTE — Telephone Encounter (Signed)
[facility] 

## 2016-01-01 NOTE — Progress Notes (Signed)
Patient ID: Angelica Bell, female   DOB: Apr 13, 1932, 82 y.o.   MRN: [id number]     [facility]   PCP: RAMOS,RICHARD D, MD  Code Status: full code  Allergies  Allergen Reactions  . Amoxicillin-Pot Clavulanate Hives and Diarrhea    Has patient had a PCN reaction causing immediate rash, facial/tongue/throat swelling, SOB or lightheadedness with hypotension: unknown Has patient had a PCN reaction causing severe rash involving mucus membranes or skin necrosis: yes Has patient had a PCN reaction that required hospitalization : unknown Has patient had a PCN reaction occurring within the last 10 years: yes If all of the above answers are "NO", then may proceed with Cephalosporin use.   Marland Kitchen Lisinopril-Hydrochlorothiazide Other (See Comments)    Tired- no energy to do anything..  . Sulfonamide Derivatives Hives    Chief Complaint  Patient presents with  . New Admit To SNF     HPI:  82 y.o. patient is here for short term rehabilitation post hospital admission from 12/24/15-12/27/15 post fall with right femoral neck fracture. She underwent right hip arthroplasty on 12/24/15. Her pain is under control with current regimen. She denies any concerns. She has been working with therapy team.   Review of Systems:  Constitutional: Negative for fever, chills HENT: Negative for headache, congestion, nasal discharge.   Eyes: Negative for double vision and discharge.  Respiratory: Negative for cough, shortness of breath and wheezing.   Cardiovascular: Negative for chest pain, palpitations, leg swelling.  Gastrointestinal: Negative for heartburn, nausea, vomiting, abdominal pain. Had bowel movement today Genitourinary: Negative for dysuria, flank pain.  Musculoskeletal: Negative for back pain, falls in the facility Skin: Negative for rash.  Neurological: Negative for dizziness Psychiatric/Behavioral: Negative for depression   Past Medical History  Diagnosis  Date  . Osteoporosis   . Pneumonia   . Chronic pain disorder     with narcotic management  . Hypertension   . Chronic back pain     with degenerative joint disease  . PE (pulmonary embolism)     After hysterectomy in 1967  . Dry eyes    Past Surgical History  Procedure Laterality Date  . Appendectomy    . Abdominal hysterectomy    . Tonsillectomy    . Breast enhancement surgery    . Hip arthroplasty Right 12/24/2015    Procedure: ARTHROPLASTY BIPOLAR HIP (HEMIARTHROPLASTY);  Surgeon: Netta Cedars, MD;  Location: [facility];  Service: Orthopedics;  Laterality: Right;   Social History:   reports that she quit smoking about 39 years ago. She has never used smokeless tobacco. She reports that she does not drink alcohol or use illicit drugs.  Family History  Problem Relation Age of Onset  . Heart Problems Mother     Medications:   Medication List       This list is accurate as of: 12/29/15 11:59 PM.  Always use your most recent med list.               aspirin EC 81 MG tablet  Take 81 mg by mouth daily.     BIOTIN PO  Take 1 tablet by mouth daily.     enoxaparin 40 MG/0.4ML injection  Commonly known as:  LOVENOX  Inject 0.4 mLs (40 mg total) into the skin daily.     fentaNYL 50 MCG/HR  Commonly known as:  DURAGESIC - dosed mcg/hr  Place 1 patch (50 mcg total) onto the skin every 3 (three) days.  fluorometholone 0.1 % ophthalmic suspension  Commonly known as:  FML  Place 1 drop into both eyes daily.     HYDROcodone-acetaminophen 5-325 MG tablet  Commonly known as:  NORCO  1-2 by mouth every 4-6 hours as needed DO NOT EXCEED 4GM OF APAP IN 24 HOURS FROM ALL SOURCES     methocarbamol 500 MG tablet  Commonly known as:  ROBAXIN  Take 1 tablet (500 mg total) by mouth every 6 (six) hours as needed for muscle spasms.     multivitamin tablet  Take 1 tablet by mouth daily.     oxycodone 30 MG immediate release tablet  Commonly known as:  ROXICODONE  Take one  tablet by mouth four times daily as needed for severe pain         Physical Exam: Filed Vitals:   12/29/15 1529  BP: 130/65  Pulse: 88  Temp: 97.2 F (36.2 C)  Resp: 18  SpO2: 95%    General- elderly female, in no acute distress Head- normocephalic, atraumatic Nose- no nasal discharge Throat- moist mucus membrane Eyes- PERRLA, EOMI, no pallor, no icterus Neck- no cervical lymphadenopathy Cardiovascular- normal s1,s2, no murmurs Respiratory- bilateral clear to auscultation, no wheeze, no rhonchi, no crackles, no use of accessory muscles Abdomen- bowel sounds present, soft, non tender Musculoskeletal- able to move all 4 extremities, limited right leg ROM, unsteady gait, trace leg edema Neurological- no focal deficit, alert and oriented to person, place and time Skin- warm and dry, right hip surgical incision healing well Psychiatry- normal mood and affect    Labs reviewed: Basic Metabolic Panel:  Recent Labs  12/24/15 2030 12/25/15 0457 12/26/15 0546  NA 142 137 135  K 4.4 4.7 4.4  CL 105 107 105  CO2 29 23 24   GLUCOSE 114* 251* 126*  BUN 35* 34* 27*  CREATININE 1.20* 0.98 0.85  CALCIUM 9.8 8.5* 8.8*    CBC:  Recent Labs  04/19/15 2014  12/25/15 0457 12/26/15 0546 12/27/15 0435  WBC 16.0*  < > 14.2* 11.3* 11.9*  NEUTROABS 13.2*  --   --   --   --   HGB 13.7  < > 10.1* 9.2* 9.0*  HCT 41.4  < > 30.3* 27.6* 27.1*  MCV 87.0  < > 88.1 88.7 88.3  PLT 299  < > 216 206 195  < > = values in this interval not displayed.   Radiological Exams: Dg Chest 2 View  12/24/2015  CLINICAL DATA:  Preop chest x-ray.  Hip fracture. EXAM: CHEST  2 VIEW COMPARISON:  CT 05/09/2015, LEFT shoulder FINDINGS: Normal cardiac silhouette. No effusion, infiltrate, pneumothorax. Remote fracture of the LEFT humerus. Scoliosis. IMPRESSION: No active cardiopulmonary disease. Scoliosis Electronically Signed   By: Suzy Bouchard M.D.   On: 12/24/2015 22:00   Dg Pelvis 1-2  Views  12/25/2015  CLINICAL DATA:  Status post right hip replacement. Initial encounter. EXAM: PELVIS - 1-2 VIEW COMPARISON:  Right hip radiographs performed 12/24/2015 FINDINGS: There has been interval placement of a right hip hemiarthroplasty, without evidence of loosening or new fracture. Mild degenerative change is noted at the pubic symphysis. Diffuse soft tissue air is noted overlying the right gluteus musculature. Overlying skin staples are seen. IMPRESSION: Interval placement of right hip hemiarthroplasty, without evidence of loosening or new fracture. Electronically Signed   By: Garald Balding M.D.   On: 12/25/2015 01:53   Dg Hip Unilat  With Pelvis 2-3 Views Right  12/24/2015  CLINICAL DATA:  Unwitnessed fall,  right hip injury EXAM: DG HIP (WITH OR WITHOUT PELVIS) 2-3V RIGHT COMPARISON:  None. FINDINGS: There is acute proximal right femoral neck fracture with slight impaction. Bones are osteopenic. Bony pelvis and left hip intact. Degenerative changes of the lower lumbar spine with an associated levoscoliosis. Mild SI joint arthropathy. Normal bowel gas pattern. IMPRESSION: Acute proximal right femoral neck fracture with slight impaction. Electronically Signed   By: Jerilynn Mages.  Shick M.D.   On: 12/24/2015 20:25     Assessment/Plan  Unsteady gait S/p right femoral neck fracture. Will have him work with physical therapy and occupational therapy team to help with gait training and muscle strengthening exercises.fall precautions. Skin care. Encourage to be out of bed.   Right femoral neck fracture S/p right hip hemiarthroplasty. Will have patient work with PT/OT as tolerated to regain strength and restore function.  Fall precautions are in place. WBAT to RLE. Continue lovenox for dvt prophylaxis. Continue oxyIR 30 mg qid prn for pain and fentanyl patch 50 mcg q3d for pain. Continue robaxin 500 mg q6h prn muscle spasm. Has follow up with orthopedics  Blood loss anemia Post op, monitor  cbc  Leukocytosis Afebrile. Monitor cbc with diff  HTN Stable, currently off all medication. Continue baby aspirin. Monitor BP  Chronic pain Seen by pain clinic. On high dosing of pain medication chronically. Continue her home regimen. Get physiatry consult.   Goals of care: short term rehabilitation   Labs/tests ordered: cbc, cmp  Family/ staff Communication: reviewed care plan with patient and nursing supervisor    Blanchie Serve, MD  [facility] [phone] (Monday-Friday 8 am - 5 pm) [phone] (afterhours)

## 2016-01-11 ENCOUNTER — Non-Acute Institutional Stay (SKILLED_NURSING_FACILITY): Payer: Medicare Other | Admitting: Adult Health

## 2016-01-11 ENCOUNTER — Encounter: Payer: Self-pay | Admitting: Adult Health

## 2016-01-11 DIAGNOSIS — K5900 Constipation, unspecified: Secondary | ICD-10-CM

## 2016-01-11 DIAGNOSIS — T814XXS Infection following a procedure, sequela: Secondary | ICD-10-CM | POA: Diagnosis not present

## 2016-01-11 DIAGNOSIS — D72829 Elevated white blood cell count, unspecified: Secondary | ICD-10-CM

## 2016-01-11 DIAGNOSIS — I10 Essential (primary) hypertension: Secondary | ICD-10-CM

## 2016-01-11 DIAGNOSIS — G8929 Other chronic pain: Secondary | ICD-10-CM | POA: Diagnosis not present

## 2016-01-11 DIAGNOSIS — D62 Acute posthemorrhagic anemia: Secondary | ICD-10-CM

## 2016-01-11 DIAGNOSIS — IMO0001 Reserved for inherently not codable concepts without codable children: Secondary | ICD-10-CM

## 2016-01-11 DIAGNOSIS — S72001S Fracture of unspecified part of neck of right femur, sequela: Secondary | ICD-10-CM

## 2016-01-11 DIAGNOSIS — G4700 Insomnia, unspecified: Secondary | ICD-10-CM

## 2016-01-11 NOTE — Progress Notes (Signed)
Patient ID: Angelica Bell, female   DOB: [birth year], 82 y.o.   MRN: [id number]    DATE:  12/28/15  MRN:  [id number]  BIRTHDAY: [birth year]  Facility:  Nursing Home Location:  [place]:  SNF [phone]  Contact Information    Name Relation Home Work [place] Daughter [phone]  [phone]   No name specified           Code Status History    Date Active Date Inactive Code Status Order ID Comments User Context   12/25/2015  1:11 AM 12/27/2015  7:17 PM Full Code [id number]  Edwin Dada, MD Inpatient       Chief Complaint  Patient presents with  . Hospitalization Follow-up    Right femoral neck fracture S/P right hip hemiarthroplasty, hypertension, chronic pain, anemia and leukocytosis    HISTORY OF PRESENT ILLNESS:  This is an 82 year old female who has been admitted to [facility] on 12/27/15 from [facility]. She has PMH of hypertension, chronic pain on high-dose oral opioid therapy - follows up with Dr. Nelva Bush (pain management) and chronic dysphagia. She had a fall @ home sustaining a displaced right femoral neck fracture. She had a right hip hemiarthroplasty on 12/24/15. She has been admitted for a short-term rehabilitation.  PAST MEDICAL HISTORY:  Past Medical History  Diagnosis Date  . Osteoporosis   . Pneumonia   . Chronic pain disorder     with narcotic management  . Hypertension   . Chronic back pain     with degenerative joint disease  . PE (pulmonary embolism)     After hysterectomy in 1967  . Dry eyes      CURRENT MEDICATIONS: Reviewed  Patient's Medications  New Prescriptions   No medications on file  Previous Medications   ASPIRIN EC 81 MG TABLET    Take 81 mg by mouth daily.   BIOTIN PO    Take 1 tablet by mouth daily.   ENOXAPARIN (LOVENOX) 40 MG/0.4ML INJECTION    Inject 0.4 mLs (40 mg total) into the skin daily.   FENTANYL (DURAGESIC - DOSED MCG/HR) 50 MCG/HR    Place 1 patch  (50 mcg total) onto the skin every 3 (three) days.   FLUOROMETHOLONE (FML) 0.1 % OPHTHALMIC SUSPENSION    Place 1 drop into both eyes daily.    HYDROCODONE-ACETAMINOPHEN (NORCO) 5-325 MG TABLET    1-2 by mouth every 4-6 hours as needed DO NOT EXCEED 4GM OF APAP IN 24 HOURS FROM ALL SOURCES   METHOCARBAMOL (ROBAXIN) 500 MG TABLET    Take 1 tablet (500 mg total) by mouth every 6 (six) hours as needed for muscle spasms.   MULTIPLE VITAMIN (MULTIVITAMIN) TABLET    Take 1 tablet by mouth daily.  Modified Medications   Modified Medication Previous Medication   OXYCODONE (ROXICODONE) 30 MG IMMEDIATE RELEASE TABLET oxycodone (ROXICODONE) 30 MG immediate release tablet      Take one tablet by mouth four times daily as needed for severe pain    Take 1 tablet (30 mg total) by mouth 4 (four) times daily as needed (Severe pain.).  Discontinued Medications   No medications on file     Allergies  Allergen Reactions  . Amoxicillin-Pot Clavulanate Hives and Diarrhea    Has patient had a PCN reaction causing immediate rash, facial/tongue/throat swelling, SOB or lightheadedness with hypotension: unknown Has patient had a PCN reaction causing severe rash  involving mucus membranes or skin necrosis: yes Has patient had a PCN reaction that required hospitalization : unknown Has patient had a PCN reaction occurring within the last 10 years: yes If all of the above answers are "NO", then may proceed with Cephalosporin use.   Marland Kitchen Lisinopril-Hydrochlorothiazide Other (See Comments)    Tired- no energy to do anything..  . Sulfonamide Derivatives Hives     REVIEW OF SYSTEMS:  GENERAL: no change in appetite, no fatigue, no weight changes, no fever, chills or weakness EYES: Denies change in vision, dry eyes, eye pain, itching or discharge EARS: Denies change in hearing, ringing in ears, or earache NOSE: Denies nasal congestion or epistaxis MOUTH and THROAT: Denies oral discomfort, gingival pain or bleeding, pain  from teeth or hoarseness   RESPIRATORY: no cough, SOB, DOE, wheezing, hemoptysis CARDIAC: no chest pain, edema or palpitations GI: no abdominal pain, diarrhea, constipation, heart burn, nausea or vomiting GU: Denies dysuria, frequency, hematuria, incontinence, or discharge PSYCHIATRIC: Denies feeling of depression or anxiety. No report of hallucinations, insomnia, paranoia, or agitation   PHYSICAL EXAMINATION  GENERAL APPEARANCE: Well nourished. In no acute distress. Normal body habitus SKIN:  Right hip surgical incision is covered with Steri-Strips and dry dressing, no erythema HEAD: Normal in size and contour. No evidence of trauma EYES: Lids open and close normally. No blepharitis, entropion or ectropion. PERRL. Conjunctivae are clear and sclerae are white. Lenses are without opacity EARS: Pinnae are normal. Patient hears normal voice tunes of the examiner MOUTH and THROAT: Lips are without lesions. Oral mucosa is moist and without lesions. Tongue is normal in shape, size, and color and without lesions NECK: supple, trachea midline, no neck masses, no thyroid tenderness, no thyromegaly LYMPHATICS: no LAN in the neck, no supraclavicular LAN RESPIRATORY: breathing is even & unlabored, BS CTAB CARDIAC: RRR, no murmur,no extra heart sounds, no edema GI: abdomen soft, normal BS, no masses, no tenderness, no hepatomegaly, no splenomegaly EXTREMITIES:  Able to move 4 extremities PSYCHIATRIC: Alert and oriented X 3. Affect and behavior are appropriate  LABS/RADIOLOGY: Labs reviewed: Basic Metabolic Panel:  Recent Labs  12/24/15 2030 12/25/15 0457 12/26/15 0546  NA 142 137 135  K 4.4 4.7 4.4  CL 105 107 105  CO2 29 23 24   GLUCOSE 114* 251* 126*  BUN 35* 34* 27*  CREATININE 1.20* 0.98 0.85  CALCIUM 9.8 8.5* 8.8*   CBC:  Recent Labs  04/19/15 2014  12/25/15 0457 12/26/15 0546 12/27/15 0435  WBC 16.0*  < > 14.2* 11.3* 11.9*  NEUTROABS 13.2*  --   --   --   --   HGB 13.7  <  > 10.1* 9.2* 9.0*  HCT 41.4  < > 30.3* 27.6* 27.1*  MCV 87.0  < > 88.1 88.7 88.3  PLT 299  < > 216 206 195  < > = values in this interval not displayed.    Dg Chest 2 View  12/24/2015  CLINICAL DATA:  Preop chest x-ray.  Hip fracture. EXAM: CHEST  2 VIEW COMPARISON:  CT 05/09/2015, LEFT shoulder FINDINGS: Normal cardiac silhouette. No effusion, infiltrate, pneumothorax. Remote fracture of the LEFT humerus. Scoliosis. IMPRESSION: No active cardiopulmonary disease. Scoliosis Electronically Signed   By: Suzy Bouchard M.D.   On: 12/24/2015 22:00   Dg Pelvis 1-2 Views  12/25/2015  CLINICAL DATA:  Status post right hip replacement. Initial encounter. EXAM: PELVIS - 1-2 VIEW COMPARISON:  Right hip radiographs performed 12/24/2015 FINDINGS: There has been interval placement of  a right hip hemiarthroplasty, without evidence of loosening or new fracture. Mild degenerative change is noted at the pubic symphysis. Diffuse soft tissue air is noted overlying the right gluteus musculature. Overlying skin staples are seen. IMPRESSION: Interval placement of right hip hemiarthroplasty, without evidence of loosening or new fracture. Electronically Signed   By: Garald Balding M.D.   On: 12/25/2015 01:53   Dg Hip Unilat  With Pelvis 2-3 Views Right  12/24/2015  CLINICAL DATA:  Unwitnessed fall, right hip injury EXAM: DG HIP (WITH OR WITHOUT PELVIS) 2-3V RIGHT COMPARISON:  None. FINDINGS: There is acute proximal right femoral neck fracture with slight impaction. Bones are osteopenic. Bony pelvis and left hip intact. Degenerative changes of the lower lumbar spine with an associated levoscoliosis. Mild SI joint arthropathy. Normal bowel gas pattern. IMPRESSION: Acute proximal right femoral neck fracture with slight impaction. Electronically Signed   By: Jerilynn Mages.  Shick M.D.   On: 12/24/2015 20:25    ASSESSMENT/PLAN:  Right femoral neck fracture S/P right hip hemiarthroplasty - for rehabilitation; RLE WBAT; continue  Lovenox 40 mg subcutaneous daily 30 days for DVT prophylaxis; Norco 5/325 mg 1 tab by mouth every 6 hours when necessary and oxycodone 30 mg IR 1 tab by mouth 4 times a day when necessary for pain; follow-up with Dr. Veverly Fells, orthopedic surgeon, in 2 weeks  Hypertension - reported being on lisinopril-HCTZ which she had not tolerated; BP twice a day 1 week; check BMP  Chronic pain - follows up with Dr. Nelva Bush or pain management; continue fentanyl 50 g/hour 1 patch every 3 days; physiatry consult  Anemia, acute blood loss - hemoglobin 9.0; check CBC  Leukocytosis - wbc 11.9; will monitor     Goals of care:  Short-term rehabilitation    [facility], NP [facility] [phone]

## 2016-01-11 NOTE — Progress Notes (Signed)
Patient ID: Angelica Bell, female   DOB: Oct 25, 1932, 82 y.o.   MRN: [id number]     DATE:  01/11/16  MRN:  [id number]  BIRTHDAY: [birth year]  Facility:  Nursing Home Location:  [place] Room Number: 103-P  LEVEL OF CARE:  SNF (55)  Contact Information    Name Relation Home Work [place] Daughter [phone]  [phone]   No name specified           Code Status History    Date Active Date Inactive Code Status Order ID Comments User Context   12/25/2015  1:11 AM 12/27/2015  7:17 PM Full Code [id number]  Edwin Dada, MD Inpatient       Chief Complaint  Patient presents with  . Discharge Note    Right femoral neck fracture S/P right hip hemiarthroplasty, hypertension, chronic pain, anemia, surgical wound infection, insomnia, constipation and leukocytosis    HISTORY OF PRESENT ILLNESS:  This is an 82 year old female who is for discharge home with home health PT, OT and CNA. DME:  Bedside commode. She has been admitted to [facility] on 12/27/15 from [facility]. She has PMH of hypertension, chronic pain on high-dose oral opioid therapy - follows up with Dr. Nelva Bush (pain management) and chronic dysphagia. She had a fall @ home sustaining a displaced right femoral neck fracture. She had a right hip hemiarthroplasty on 12/24/15.   Patient was admitted to this facility for short-term rehabilitation after the patient's recent hospitalization.  Patient has completed SNF rehabilitation and therapy has cleared the patient for discharge.  PAST MEDICAL HISTORY:  Past Medical History  Diagnosis Date  . Osteoporosis   . Pneumonia   . Chronic pain disorder     with narcotic management  . Hypertension   . Chronic back pain     with degenerative joint disease  . PE (pulmonary embolism)     After hysterectomy in 1967  . Dry eyes      CURRENT MEDICATIONS: Reviewed  Patient's Medications  New Prescriptions   No  medications on file  Previous Medications   ASPIRIN EC 81 MG TABLET    Take 81 mg by mouth daily.   BIOTIN PO    Take 1 tablet by mouth daily.   DOXYCYCLINE (VIBRAMYCIN) 100 MG CAPSULE    Take 100 mg by mouth 2 (two) times daily. X 8  more days   ENOXAPARIN (LOVENOX) 40 MG/0.4ML INJECTION    Inject 0.4 mLs (40 mg total) into the skin daily.   FENTANYL (DURAGESIC - DOSED MCG/HR) 50 MCG/HR    Place 1 patch (50 mcg total) onto the skin every 3 (three) days.   FLUOROMETHOLONE (FML) 0.1 % OPHTHALMIC SUSPENSION    Place 1 drop into both eyes daily.    HYDROCODONE-ACETAMINOPHEN (NORCO) 5-325 MG TABLET    1-2 by mouth every 4-6 hours as needed DO NOT EXCEED 4GM OF APAP IN 24 HOURS FROM ALL SOURCES   MELATONIN 3 MG TABS    Take 3 mg by mouth at bedtime as needed.   METHOCARBAMOL (ROBAXIN) 500 MG TABLET    Take 1 tablet (500 mg total) by mouth every 6 (six) hours as needed for muscle spasms.   MULTIPLE VITAMIN (MULTIVITAMIN) TABLET    Take 1 tablet by mouth daily.   OXYCODONE (ROXICODONE) 30 MG IMMEDIATE RELEASE TABLET    Take one tablet by mouth four times daily as needed for severe pain  SACCHAROMYCES BOULARDII (FLORASTOR) 250 MG CAPSULE    Take 250 mg by mouth 2 (two) times daily. X 11 more days   SENNOSIDES-DOCUSATE SODIUM (SENOKOT-S) 8.6-50 MG TABLET    Take 2 tablets by mouth 2 (two) times daily.  Modified Medications   No medications on file  Discontinued Medications   No medications on file     Allergies  Allergen Reactions  . Amoxicillin-Pot Clavulanate Hives and Diarrhea    Has patient had a PCN reaction causing immediate rash, facial/tongue/throat swelling, SOB or lightheadedness with hypotension: unknown Has patient had a PCN reaction causing severe rash involving mucus membranes or skin necrosis: yes Has patient had a PCN reaction that required hospitalization : unknown Has patient had a PCN reaction occurring within the last 10 years: yes If all of the above answers are "NO", then  may proceed with Cephalosporin use.   Marland Kitchen Lisinopril-Hydrochlorothiazide Other (See Comments)    Tired- no energy to do anything..  . Sulfonamide Derivatives Hives     REVIEW OF SYSTEMS:  GENERAL: no change in appetite, no fatigue, no weight changes, no fever, chills or weakness EYES: Denies change in vision, dry eyes, eye pain, itching or discharge EARS: Denies change in hearing, ringing in ears, or earache NOSE: Denies nasal congestion or epistaxis MOUTH and THROAT: Denies oral discomfort, gingival pain or bleeding, pain from teeth or hoarseness   RESPIRATORY: no cough, SOB, DOE, wheezing, hemoptysis CARDIAC: no chest pain, edema or palpitations GI: no abdominal pain, diarrhea, constipation, heart burn, nausea or vomiting GU: Denies dysuria, frequency, hematuria, incontinence, or discharge PSYCHIATRIC: Denies feeling of depression or anxiety. No report of hallucinations, insomnia, paranoia, or agitation   PHYSICAL EXAMINATION  GENERAL APPEARANCE: Well nourished. In no acute distress. Normal body habitus SKIN:  Right hip surgical incision has steri-strips and dry dressing with dry drainage HEAD: Normal in size and contour. No evidence of trauma EYES: Lids open and close normally. No blepharitis, entropion or ectropion. PERRL. Conjunctivae are clear and sclerae are white. Lenses are without opacity EARS: Pinnae are normal. Patient hears normal voice tunes of the examiner MOUTH and THROAT: Lips are without lesions. Oral mucosa is moist and without lesions. Tongue is normal in shape, size, and color and without lesions NECK: supple, trachea midline, no neck masses, no thyroid tenderness, no thyromegaly LYMPHATICS: no LAN in the neck, no supraclavicular LAN RESPIRATORY: breathing is even & unlabored, BS CTAB CARDIAC: RRR, no murmur,no extra heart sounds, no edema GI: abdomen soft, normal BS, no masses, no tenderness, no hepatomegaly, no splenomegaly EXTREMITIES:  Able to move 4  extremities PSYCHIATRIC: Alert and oriented X 3. Affect and behavior are appropriate  LABS/RADIOLOGY: Labs reviewed: 01/02/16  WBC 10.8 hemoglobin 9.7 hematocrit 30.4 MCV 89.7 platelet 446 sodium 139 potassium 4.0 glucose 127 BUN 16 creatinine 0.86 calcium 8.9 12/29/15  WBC 10.4 hemoglobin 8.8 hematocrit 27.4 MCV 90.1 platelet 272 sodium 138 potassium 4.5 glucose 106 BUN 17 creatinine 0.69 calcium 8.8 Basic Metabolic Panel:  Recent Labs  12/24/15 2030 12/25/15 0457 12/26/15 0546  NA 142 137 135  K 4.4 4.7 4.4  CL 105 107 105  CO2 29 23 24   GLUCOSE 114* 251* 126*  BUN 35* 34* 27*  CREATININE 1.20* 0.98 0.85  CALCIUM 9.8 8.5* 8.8*   CBC:  Recent Labs  04/19/15 2014  12/25/15 0457 12/26/15 0546 12/27/15 0435  WBC 16.0*  < > 14.2* 11.3* 11.9*  NEUTROABS 13.2*  --   --   --   --  HGB 13.7  < > 10.1* 9.2* 9.0*  HCT 41.4  < > 30.3* 27.6* 27.1*  MCV 87.0  < > 88.1 88.7 88.3  PLT 299  < > 216 206 195  < > = values in this interval not displayed.    Dg Chest 2 View  12/24/2015  CLINICAL DATA:  Preop chest x-ray.  Hip fracture. EXAM: CHEST  2 VIEW COMPARISON:  CT 05/09/2015, LEFT shoulder FINDINGS: Normal cardiac silhouette. No effusion, infiltrate, pneumothorax. Remote fracture of the LEFT humerus. Scoliosis. IMPRESSION: No active cardiopulmonary disease. Scoliosis Electronically Signed   By: Suzy Bouchard M.D.   On: 12/24/2015 22:00   Dg Pelvis 1-2 Views  12/25/2015  CLINICAL DATA:  Status post right hip replacement. Initial encounter. EXAM: PELVIS - 1-2 VIEW COMPARISON:  Right hip radiographs performed 12/24/2015 FINDINGS: There has been interval placement of a right hip hemiarthroplasty, without evidence of loosening or new fracture. Mild degenerative change is noted at the pubic symphysis. Diffuse soft tissue air is noted overlying the right gluteus musculature. Overlying skin staples are seen. IMPRESSION: Interval placement of right hip hemiarthroplasty, without evidence  of loosening or new fracture. Electronically Signed   By: Garald Balding M.D.   On: 12/25/2015 01:53   Dg Hip Unilat  With Pelvis 2-3 Views Right  12/24/2015  CLINICAL DATA:  Unwitnessed fall, right hip injury EXAM: DG HIP (WITH OR WITHOUT PELVIS) 2-3V RIGHT COMPARISON:  None. FINDINGS: There is acute proximal right femoral neck fracture with slight impaction. Bones are osteopenic. Bony pelvis and left hip intact. Degenerative changes of the lower lumbar spine with an associated levoscoliosis. Mild SI joint arthropathy. Normal bowel gas pattern. IMPRESSION: Acute proximal right femoral neck fracture with slight impaction. Electronically Signed   By: Jerilynn Mages.  Shick M.D.   On: 12/24/2015 20:25    ASSESSMENT/PLAN:  Right femoral neck fracture S/P right hip hemiarthroplasty - for home health PT, OT and CNA; RLE WBAT; continue Lovenox 40 mg subcutaneous daily 13 more days for DVT prophylaxis; Norco 5/325 mg 1 tab by mouth every 6 hours when necessary and oxycodone 30 mg IR 1 tab by mouth 4 times a day when necessary for pain; follow-up with Dr. Veverly Fells, orthopedic surgeon  Hypertension - reported being on lisinopril-HCTZ which she had not tolerated; continue to monitor BP  Chronic pain - follows up with Dr. Nelva Bush or pain management; continue fentanyl 50 g/hour 1 patch every 3 days  Anemia, acute blood loss - hemoglobin 9.0; recheck 9.7, improved  Leukocytosis - wbc 11.9; recheck 10.8, resolved  Right hip surgical wound infection - continue doxycycline 100 mg 1 by mouth twice a day 8 more days and Florastor 150 mg 1 capsule by mouth twice a day 11 more days  Insomnia - continue melatonin 3 mg 1 tab by mouth daily at bedtime when necessary  Constipation - continue senna S2 tabs by mouth twice a day      I have filled out patient's discharge paperwork and written prescriptions.  Patient will receive home health PT, OT and CNA.  DME provided:  Bedside commode  Total discharge time: Greater than  30 minutes  Discharge time involved coordination of the discharge process with social worker, nursing staff and therapy department. Medical justification for home health services/DME verified.    [facility], NP [employer] [phone]

## 2016-05-01 ENCOUNTER — Encounter (HOSPITAL_BASED_OUTPATIENT_CLINIC_OR_DEPARTMENT_OTHER): Payer: Medicare Other | Attending: Surgery

## 2016-05-01 DIAGNOSIS — Z79891 Long term (current) use of opiate analgesic: Secondary | ICD-10-CM | POA: Diagnosis not present

## 2016-05-01 DIAGNOSIS — Z86711 Personal history of pulmonary embolism: Secondary | ICD-10-CM | POA: Insufficient documentation

## 2016-05-01 DIAGNOSIS — Z87891 Personal history of nicotine dependence: Secondary | ICD-10-CM | POA: Diagnosis not present

## 2016-05-01 DIAGNOSIS — L97421 Non-pressure chronic ulcer of left heel and midfoot limited to breakdown of skin: Secondary | ICD-10-CM | POA: Diagnosis not present

## 2016-05-01 DIAGNOSIS — G894 Chronic pain syndrome: Secondary | ICD-10-CM | POA: Diagnosis not present

## 2016-05-01 DIAGNOSIS — Z79899 Other long term (current) drug therapy: Secondary | ICD-10-CM | POA: Diagnosis not present

## 2016-05-01 DIAGNOSIS — I10 Essential (primary) hypertension: Secondary | ICD-10-CM | POA: Diagnosis not present

## 2016-05-01 DIAGNOSIS — E1140 Type 2 diabetes mellitus with diabetic neuropathy, unspecified: Secondary | ICD-10-CM | POA: Diagnosis not present

## 2016-05-01 DIAGNOSIS — M069 Rheumatoid arthritis, unspecified: Secondary | ICD-10-CM | POA: Insufficient documentation

## 2016-05-01 DIAGNOSIS — L97411 Non-pressure chronic ulcer of right heel and midfoot limited to breakdown of skin: Secondary | ICD-10-CM | POA: Diagnosis present

## 2016-05-01 DIAGNOSIS — I890 Lymphedema, not elsewhere classified: Secondary | ICD-10-CM | POA: Insufficient documentation

## 2016-05-08 DIAGNOSIS — L97421 Non-pressure chronic ulcer of left heel and midfoot limited to breakdown of skin: Secondary | ICD-10-CM | POA: Diagnosis not present

## 2016-05-08 LAB — GLUCOSE, CAPILLARY: Glucose-Capillary: 70 mg/dL (ref 65–99)

## 2016-05-14 ENCOUNTER — Encounter (HOSPITAL_BASED_OUTPATIENT_CLINIC_OR_DEPARTMENT_OTHER): Payer: Medicare Other

## 2016-05-15 DIAGNOSIS — L97421 Non-pressure chronic ulcer of left heel and midfoot limited to breakdown of skin: Secondary | ICD-10-CM | POA: Diagnosis not present

## 2016-05-29 DIAGNOSIS — L97421 Non-pressure chronic ulcer of left heel and midfoot limited to breakdown of skin: Secondary | ICD-10-CM | POA: Diagnosis not present

## 2016-06-05 ENCOUNTER — Encounter (HOSPITAL_BASED_OUTPATIENT_CLINIC_OR_DEPARTMENT_OTHER): Payer: Medicare Other | Attending: Surgery

## 2016-06-05 DIAGNOSIS — I10 Essential (primary) hypertension: Secondary | ICD-10-CM | POA: Insufficient documentation

## 2016-06-05 DIAGNOSIS — M069 Rheumatoid arthritis, unspecified: Secondary | ICD-10-CM | POA: Insufficient documentation

## 2016-06-05 DIAGNOSIS — Z09 Encounter for follow-up examination after completed treatment for conditions other than malignant neoplasm: Secondary | ICD-10-CM | POA: Insufficient documentation

## 2016-06-05 DIAGNOSIS — Z872 Personal history of diseases of the skin and subcutaneous tissue: Secondary | ICD-10-CM | POA: Insufficient documentation

## 2016-06-19 DIAGNOSIS — L97421 Non-pressure chronic ulcer of left heel and midfoot limited to breakdown of skin: Secondary | ICD-10-CM | POA: Diagnosis present

## 2016-06-19 DIAGNOSIS — Z09 Encounter for follow-up examination after completed treatment for conditions other than malignant neoplasm: Secondary | ICD-10-CM | POA: Diagnosis not present

## 2016-06-19 DIAGNOSIS — I10 Essential (primary) hypertension: Secondary | ICD-10-CM | POA: Diagnosis not present

## 2016-06-19 DIAGNOSIS — Z872 Personal history of diseases of the skin and subcutaneous tissue: Secondary | ICD-10-CM | POA: Diagnosis not present

## 2016-06-19 DIAGNOSIS — M069 Rheumatoid arthritis, unspecified: Secondary | ICD-10-CM | POA: Diagnosis not present

## 2016-07-03 ENCOUNTER — Encounter (HOSPITAL_BASED_OUTPATIENT_CLINIC_OR_DEPARTMENT_OTHER): Payer: Medicare Other | Attending: Surgery

## 2016-07-26 ENCOUNTER — Other Ambulatory Visit: Payer: Self-pay | Admitting: Physician Assistant

## 2016-07-26 DIAGNOSIS — R600 Localized edema: Secondary | ICD-10-CM

## 2016-07-29 ENCOUNTER — Other Ambulatory Visit: Payer: Medicare Other

## 2016-08-01 ENCOUNTER — Ambulatory Visit
Admission: RE | Admit: 2016-08-01 | Discharge: 2016-08-01 | Disposition: A | Discharge status: Home / Self Care | Payer: Medicare Other | Source: Ambulatory Visit | Attending: Physician Assistant | Admitting: Physician Assistant

## 2016-08-01 DIAGNOSIS — R600 Localized edema: Secondary | ICD-10-CM

## 2016-08-22 IMAGING — CR DG CHEST 2V
1 series · 1 of 1 positions shown · non-contrast
Comparison: CT 05/09/2015, LEFT shoulder

CLINICAL DATA: Preop chest x-ray.  Hip fracture.

EXAM:
CHEST  2 VIEW

[x chest ap]
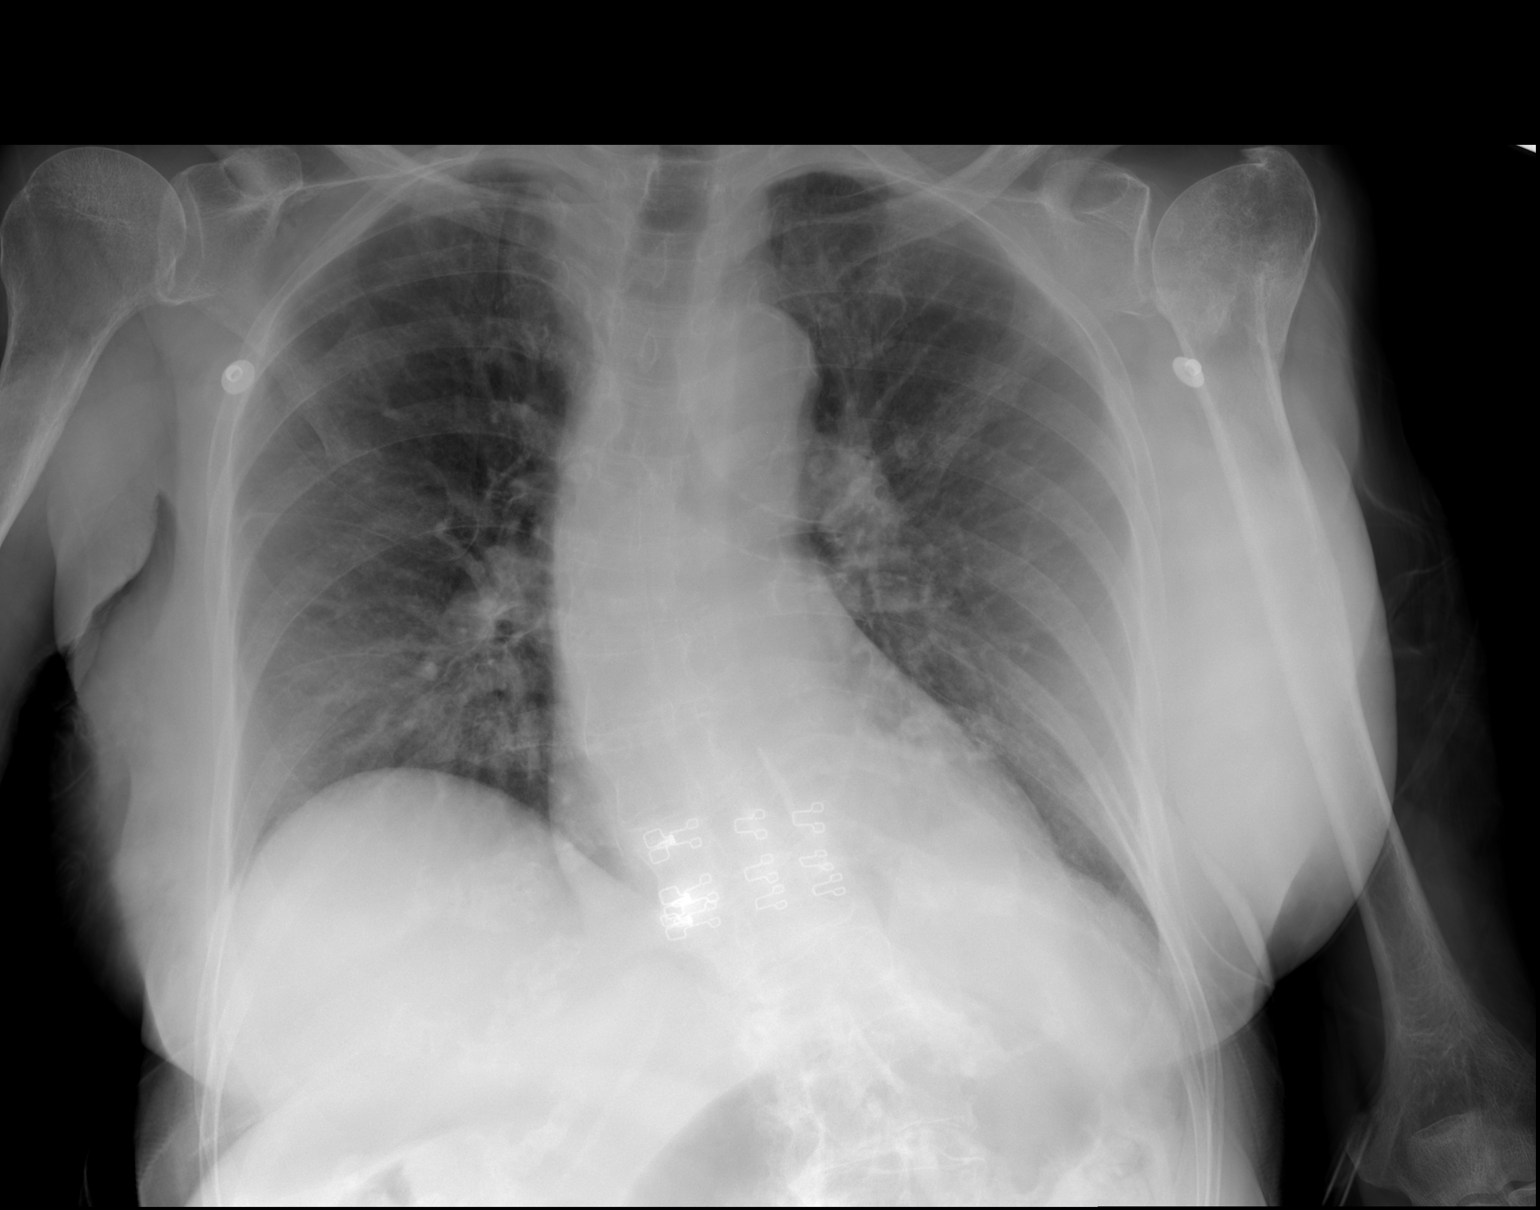

[1 of 1 positions shown; findings below may reference images not displayed]

FINDINGS: Normal cardiac silhouette. No effusion, infiltrate, pneumothorax.
Remote fracture of the LEFT humerus. Scoliosis.
IMPRESSION: No active cardiopulmonary disease.

Scoliosis

## 2016-08-22 IMAGING — CR DG HIP (WITH OR WITHOUT PELVIS) 2-3V*R*
3 series · 3 of 3 positions shown · non-contrast
Comparison: None.

CLINICAL DATA: Unwitnessed fall, right hip injury

EXAM:
DG HIP (WITH OR WITHOUT PELVIS) 2-3V RIGHT

[t pelvis ap]
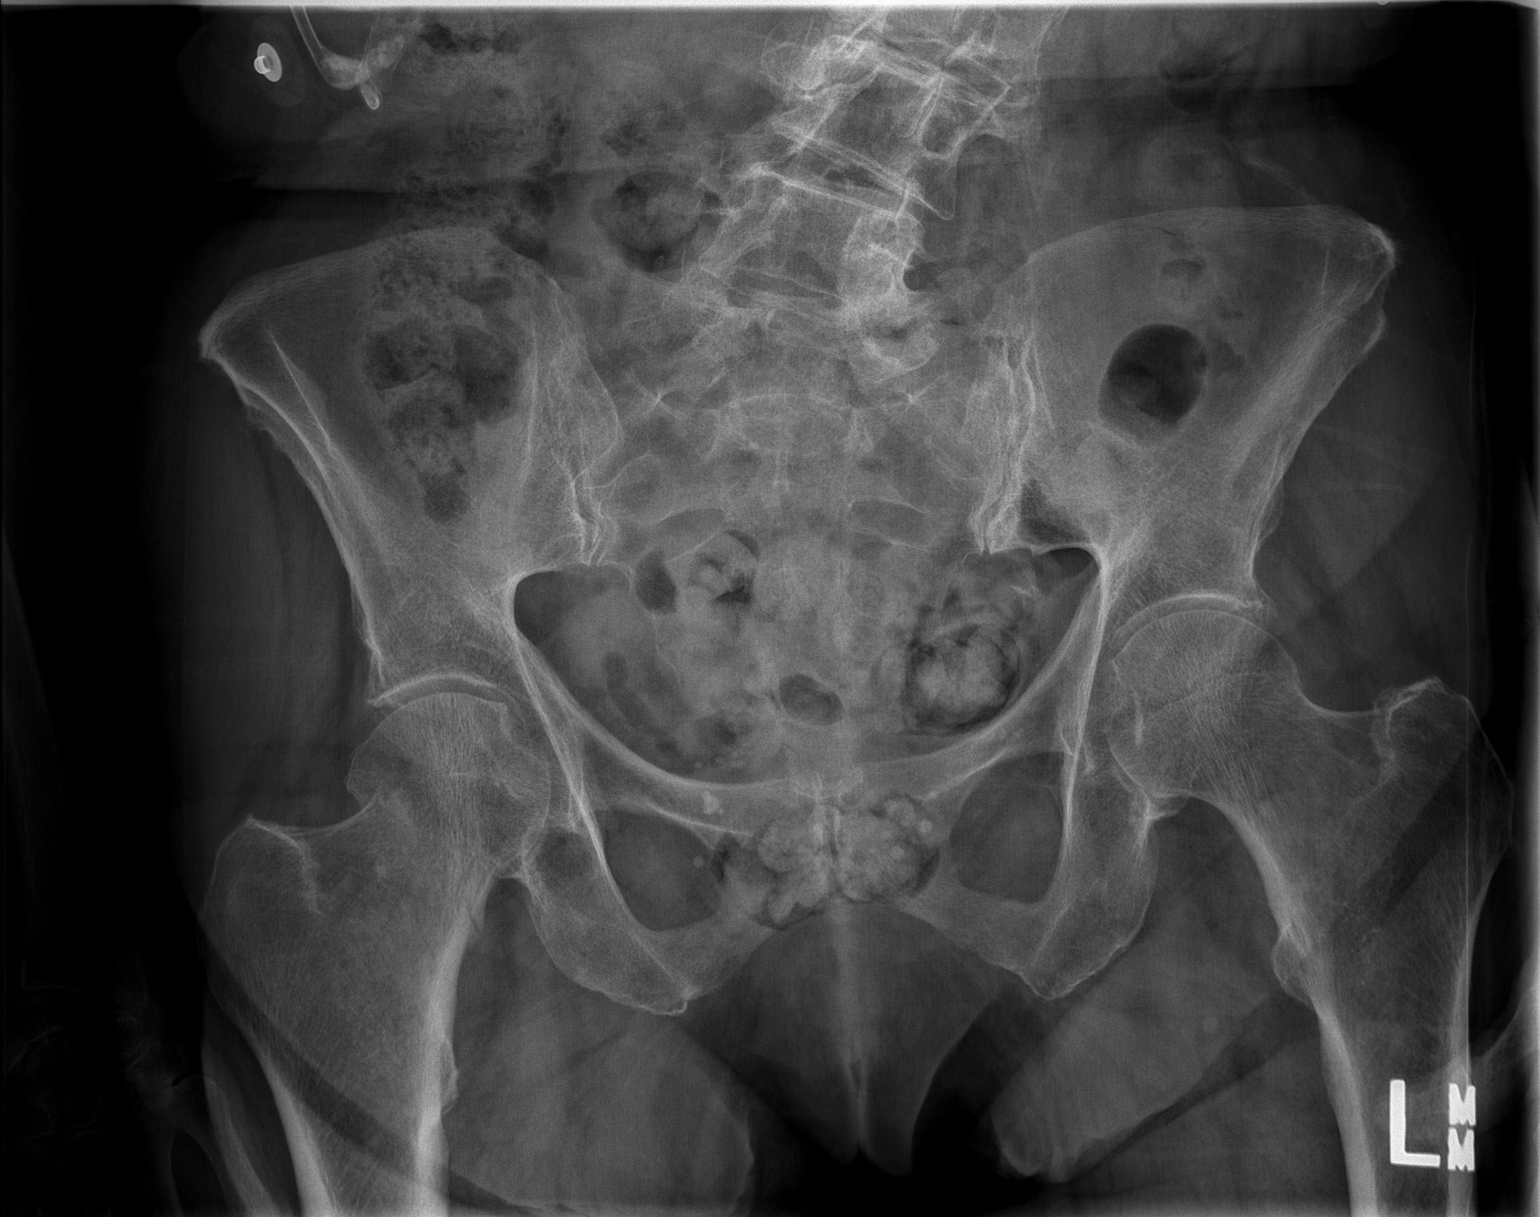

[t hip ap right]
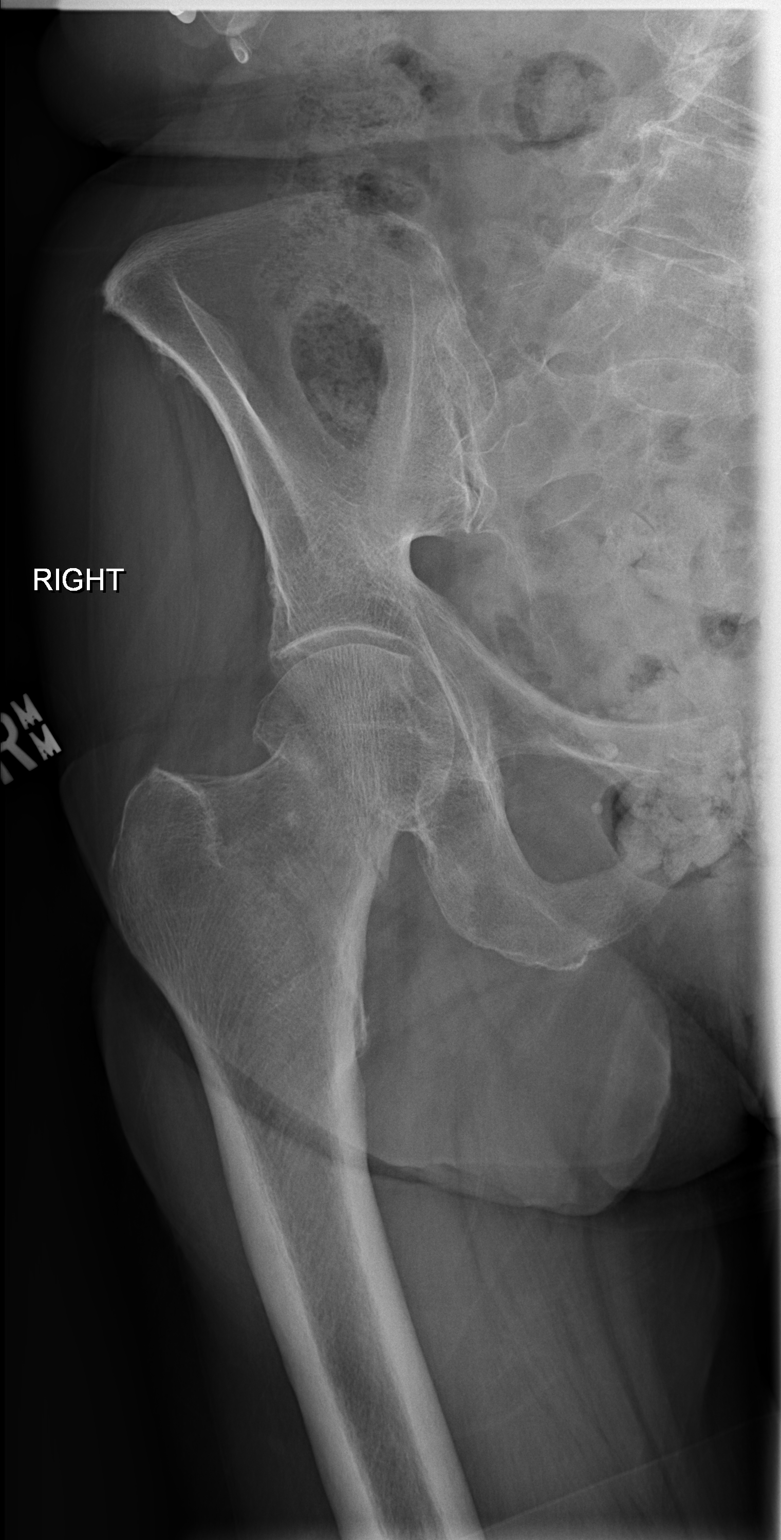

[w hip lat right]
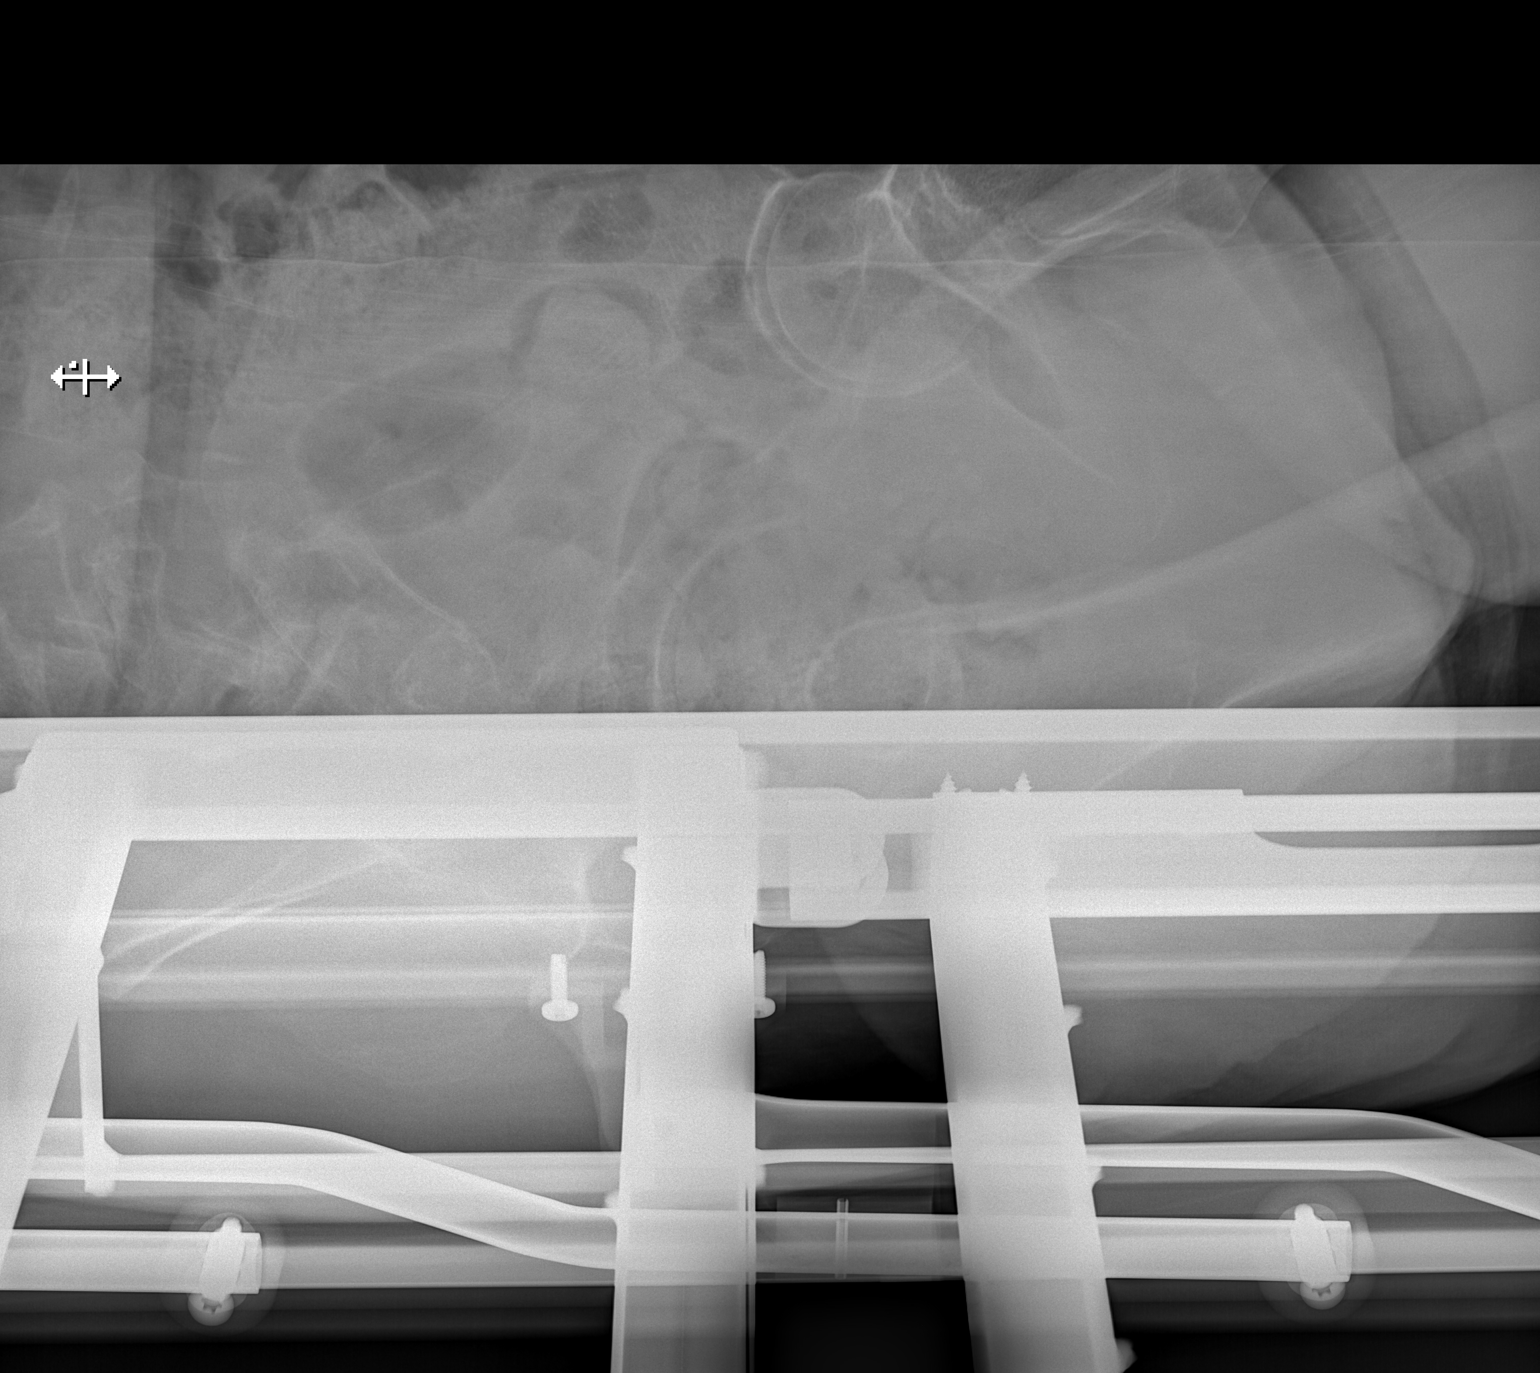

[3 of 3 positions shown; findings below may reference images not displayed]

FINDINGS: There is acute proximal right femoral neck fracture with slight
impaction. Bones are osteopenic. Bony pelvis and left hip intact.
Degenerative changes of the lower lumbar spine with an associated
levoscoliosis. Mild SI joint arthropathy. Normal bowel gas pattern.
IMPRESSION: Acute proximal right femoral neck fracture with slight impaction.

## 2016-08-23 IMAGING — DX DG PELVIS 1-2V
1 series · 1 of 1 positions shown · non-contrast
Comparison: Right hip radiographs performed 12/24/2015

CLINICAL DATA: Status post right hip replacement. Initial
encounter.

EXAM:
PELVIS - 1-2 VIEW

[pelvis ap]
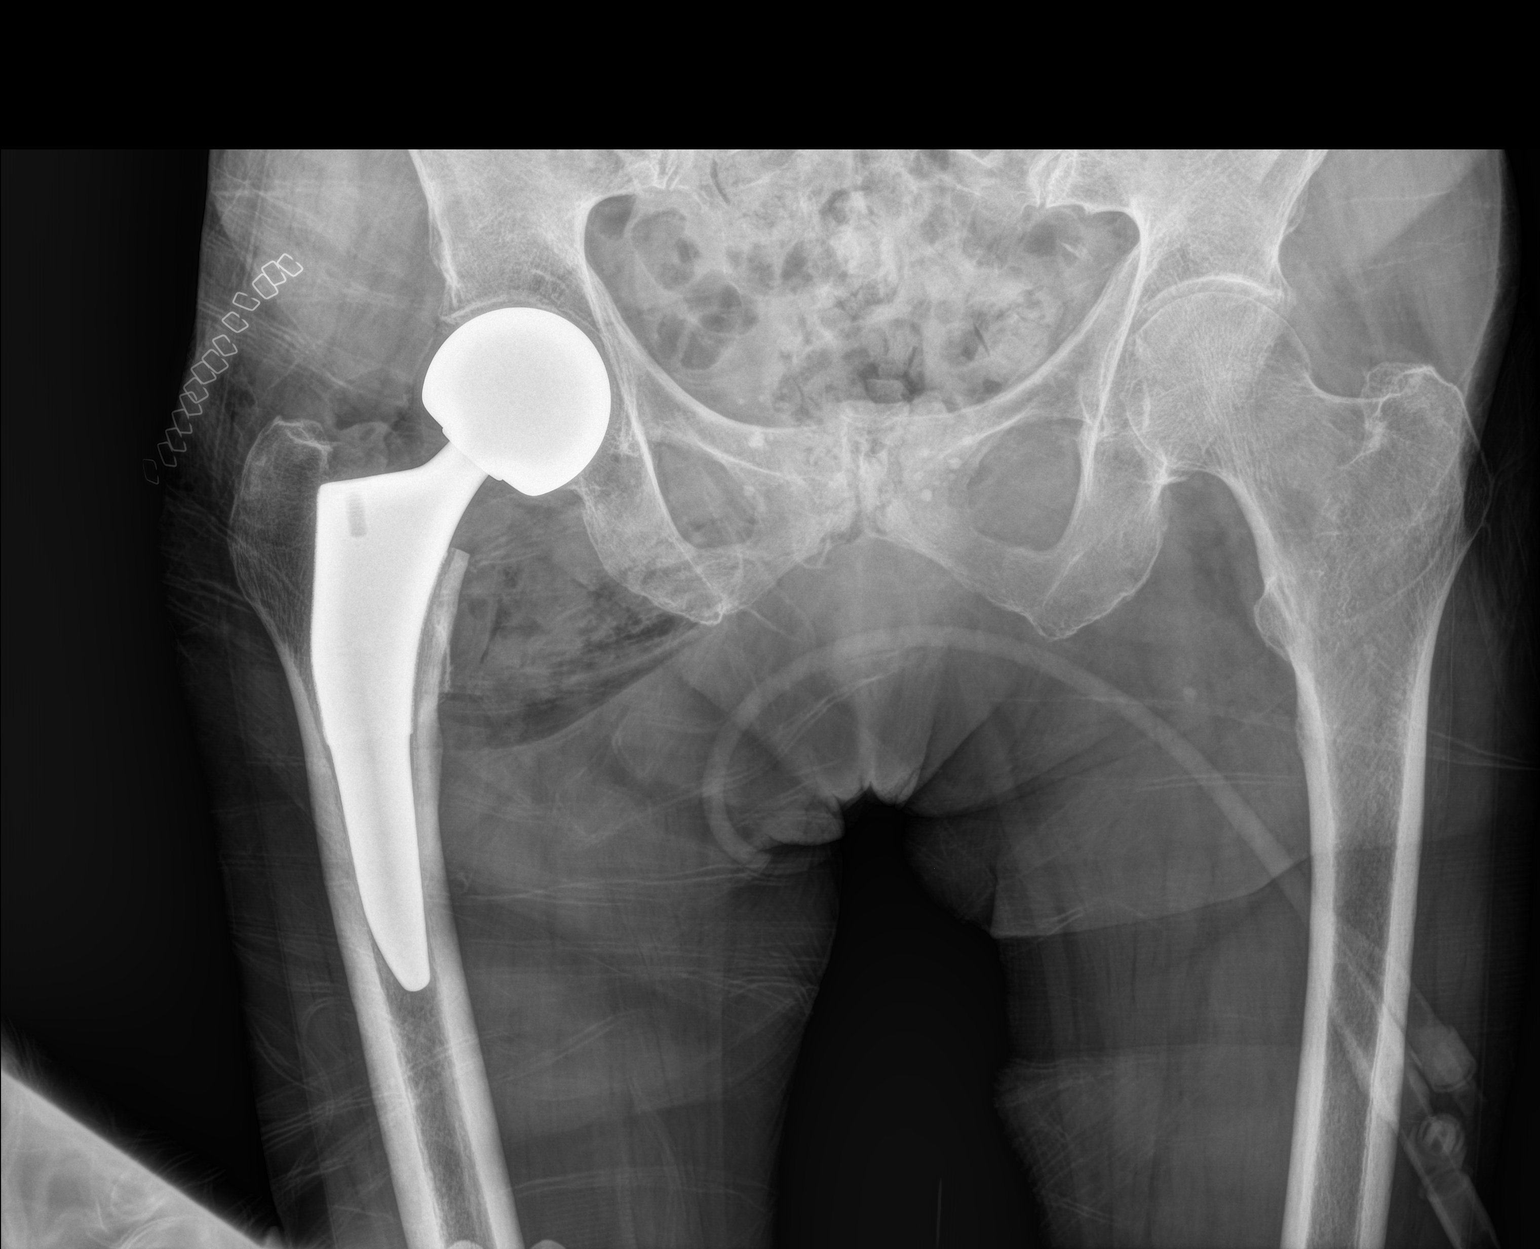

[1 of 1 positions shown; findings below may reference images not displayed]

FINDINGS: There has been interval placement of a right hip hemiarthroplasty,
without evidence of loosening or new fracture. Mild degenerative
change is noted at the pubic symphysis. Diffuse soft tissue air is
noted overlying the right gluteus musculature. Overlying skin
staples are seen.
IMPRESSION: Interval placement of right hip hemiarthroplasty, without evidence
of loosening or new fracture.

## 2016-09-06 ENCOUNTER — Other Ambulatory Visit: Payer: Self-pay | Admitting: Orthopedic Surgery

## 2016-09-06 DIAGNOSIS — S72041D Displaced fracture of base of neck of right femur, subsequent encounter for closed fracture with routine healing: Secondary | ICD-10-CM

## 2016-09-10 ENCOUNTER — Ambulatory Visit
Admission: RE | Admit: 2016-09-10 | Discharge: 2016-09-10 | Disposition: A | Discharge status: Home / Self Care | Payer: Medicare Other | Source: Ambulatory Visit | Attending: Orthopedic Surgery | Admitting: Orthopedic Surgery

## 2016-09-10 DIAGNOSIS — S72041D Displaced fracture of base of neck of right femur, subsequent encounter for closed fracture with routine healing: Secondary | ICD-10-CM

## 2016-09-11 ENCOUNTER — Other Ambulatory Visit: Payer: Medicare Other

## 2017-02-16 ENCOUNTER — Inpatient Hospital Stay (HOSPITAL_COMMUNITY)
Admission: EM | Admit: 2017-02-16 | Discharge: 2017-02-19 | DRG: 482 | Disposition: A | Discharge status: Home / Self Care | Payer: Medicare Other | Attending: Internal Medicine | Admitting: Internal Medicine

## 2017-02-16 ENCOUNTER — Emergency Department (HOSPITAL_COMMUNITY): Payer: Medicare Other

## 2017-02-16 ENCOUNTER — Encounter (HOSPITAL_COMMUNITY): Payer: Self-pay | Admitting: Emergency Medicine

## 2017-02-16 DIAGNOSIS — D72829 Elevated white blood cell count, unspecified: Secondary | ICD-10-CM | POA: Diagnosis present

## 2017-02-16 DIAGNOSIS — S0990XA Unspecified injury of head, initial encounter: Secondary | ICD-10-CM | POA: Diagnosis present

## 2017-02-16 DIAGNOSIS — Z79891 Long term (current) use of opiate analgesic: Secondary | ICD-10-CM

## 2017-02-16 DIAGNOSIS — Z880 Allergy status to penicillin: Secondary | ICD-10-CM

## 2017-02-16 DIAGNOSIS — Z87891 Personal history of nicotine dependence: Secondary | ICD-10-CM | POA: Diagnosis not present

## 2017-02-16 DIAGNOSIS — Z9889 Other specified postprocedural states: Secondary | ICD-10-CM | POA: Diagnosis not present

## 2017-02-16 DIAGNOSIS — Z86711 Personal history of pulmonary embolism: Secondary | ICD-10-CM

## 2017-02-16 DIAGNOSIS — Z96641 Presence of right artificial hip joint: Secondary | ICD-10-CM | POA: Diagnosis present

## 2017-02-16 DIAGNOSIS — Z90710 Acquired absence of both cervix and uterus: Secondary | ICD-10-CM | POA: Diagnosis not present

## 2017-02-16 DIAGNOSIS — Y92009 Unspecified place in unspecified non-institutional (private) residence as the place of occurrence of the external cause: Secondary | ICD-10-CM

## 2017-02-16 DIAGNOSIS — I10 Essential (primary) hypertension: Secondary | ICD-10-CM | POA: Diagnosis present

## 2017-02-16 DIAGNOSIS — Z66 Do not resuscitate: Secondary | ICD-10-CM | POA: Diagnosis present

## 2017-02-16 DIAGNOSIS — Z882 Allergy status to sulfonamides status: Secondary | ICD-10-CM | POA: Diagnosis not present

## 2017-02-16 DIAGNOSIS — M810 Age-related osteoporosis without current pathological fracture: Secondary | ICD-10-CM | POA: Diagnosis present

## 2017-02-16 DIAGNOSIS — M549 Dorsalgia, unspecified: Secondary | ICD-10-CM | POA: Diagnosis present

## 2017-02-16 DIAGNOSIS — Z888 Allergy status to other drugs, medicaments and biological substances status: Secondary | ICD-10-CM | POA: Diagnosis not present

## 2017-02-16 DIAGNOSIS — S72142A Displaced intertrochanteric fracture of left femur, initial encounter for closed fracture: Secondary | ICD-10-CM | POA: Diagnosis not present

## 2017-02-16 DIAGNOSIS — M1990 Unspecified osteoarthritis, unspecified site: Secondary | ICD-10-CM | POA: Diagnosis present

## 2017-02-16 DIAGNOSIS — Z9049 Acquired absence of other specified parts of digestive tract: Secondary | ICD-10-CM

## 2017-02-16 DIAGNOSIS — W1830XA Fall on same level, unspecified, initial encounter: Secondary | ICD-10-CM | POA: Diagnosis present

## 2017-02-16 DIAGNOSIS — E876 Hypokalemia: Secondary | ICD-10-CM | POA: Diagnosis present

## 2017-02-16 DIAGNOSIS — G8928 Other chronic postprocedural pain: Secondary | ICD-10-CM | POA: Diagnosis not present

## 2017-02-16 DIAGNOSIS — G8929 Other chronic pain: Secondary | ICD-10-CM | POA: Diagnosis present

## 2017-02-16 DIAGNOSIS — Z8249 Family history of ischemic heart disease and other diseases of the circulatory system: Secondary | ICD-10-CM | POA: Diagnosis not present

## 2017-02-16 DIAGNOSIS — D649 Anemia, unspecified: Secondary | ICD-10-CM | POA: Diagnosis not present

## 2017-02-16 DIAGNOSIS — Z419 Encounter for procedure for purposes other than remedying health state, unspecified: Secondary | ICD-10-CM

## 2017-02-16 LAB — I-STAT CHEM 8, ED
BUN: 17 mg/dL (ref 6–20)
CALCIUM ION: 1.21 mmol/L (ref 1.15–1.40)
CHLORIDE: 104 mmol/L (ref 101–111)
Creatinine, Ser: 0.8 mg/dL (ref 0.44–1.00)
GLUCOSE: 134 mg/dL — AB (ref 65–99)
HCT: 38 % (ref 36.0–46.0)
HEMOGLOBIN: 12.9 g/dL (ref 12.0–15.0)
Potassium: 3.3 mmol/L — ABNORMAL LOW (ref 3.5–5.1)
SODIUM: 141 mmol/L (ref 135–145)
TCO2: 27 mmol/L (ref 0–100)

## 2017-02-16 LAB — ABO/RH: ABO/RH(D): A NEG

## 2017-02-16 LAB — URINALYSIS, ROUTINE W REFLEX MICROSCOPIC
Bilirubin Urine: NEGATIVE
Glucose, UA: NEGATIVE mg/dL
Hgb urine dipstick: NEGATIVE
Ketones, ur: NEGATIVE mg/dL
LEUKOCYTES UA: NEGATIVE
Nitrite: NEGATIVE
PROTEIN: NEGATIVE mg/dL
Specific Gravity, Urine: 1.011 (ref 1.005–1.030)
pH: 5 (ref 5.0–8.0)

## 2017-02-16 LAB — BASIC METABOLIC PANEL
ANION GAP: 9 (ref 5–15)
BUN: 18 mg/dL (ref 6–20)
CHLORIDE: 105 mmol/L (ref 101–111)
CO2: 26 mmol/L (ref 22–32)
Calcium: 9.6 mg/dL (ref 8.9–10.3)
Creatinine, Ser: 0.76 mg/dL (ref 0.44–1.00)
Glucose, Bld: 132 mg/dL — ABNORMAL HIGH (ref 65–99)
POTASSIUM: 3.2 mmol/L — AB (ref 3.5–5.1)
SODIUM: 140 mmol/L (ref 135–145)

## 2017-02-16 LAB — CBC WITH DIFFERENTIAL/PLATELET
BASOS ABS: 0.1 10*3/uL (ref 0.0–0.1)
BASOS PCT: 0 %
EOS ABS: 0.4 10*3/uL (ref 0.0–0.7)
EOS PCT: 3 %
HCT: 37.9 % (ref 36.0–46.0)
HEMOGLOBIN: 12.5 g/dL (ref 12.0–15.0)
Lymphocytes Relative: 19 %
Lymphs Abs: 2.9 10*3/uL (ref 0.7–4.0)
MCH: 28.2 pg (ref 26.0–34.0)
MCHC: 33 g/dL (ref 30.0–36.0)
MCV: 85.6 fL (ref 78.0–100.0)
Monocytes Absolute: 1.1 10*3/uL — ABNORMAL HIGH (ref 0.1–1.0)
Monocytes Relative: 7 %
NEUTROS PCT: 71 %
Neutro Abs: 10.6 10*3/uL — ABNORMAL HIGH (ref 1.7–7.7)
PLATELETS: 300 10*3/uL (ref 150–400)
RBC: 4.43 MIL/uL (ref 3.87–5.11)
RDW: 13.4 % (ref 11.5–15.5)
WBC: 15 10*3/uL — ABNORMAL HIGH (ref 4.0–10.5)

## 2017-02-16 LAB — APTT: aPTT: 26 seconds (ref 24–36)

## 2017-02-16 LAB — TYPE AND SCREEN
ABO/RH(D): A NEG
Antibody Screen: NEGATIVE

## 2017-02-16 LAB — PROTIME-INR
INR: 1.02
PROTHROMBIN TIME: 13.4 s (ref 11.4–15.2)

## 2017-02-16 LAB — SURGICAL PCR SCREEN
MRSA, PCR: POSITIVE — AB
STAPHYLOCOCCUS AUREUS: POSITIVE — AB

## 2017-02-16 MED ORDER — ONDANSETRON HCL 4 MG/2ML IJ SOLN
4.0000 mg | Freq: Once | INTRAMUSCULAR | Status: AC
  Administered 2017-02-16: 4 mg via INTRAVENOUS
  Filled 2017-02-16: qty 2

## 2017-02-16 MED ORDER — CLINDAMYCIN PHOSPHATE 900 MG/50ML IV SOLN
900.0000 mg | INTRAVENOUS | Status: AC
  Administered 2017-02-17: 900 mg via INTRAVENOUS
  Filled 2017-02-16 (×2): qty 50

## 2017-02-16 MED ORDER — POVIDONE-IODINE 10 % EX SWAB: 2.0000 "application " | Freq: Once | CUTANEOUS | Status: DC

## 2017-02-16 MED ORDER — HYDROMORPHONE HCL 1 MG/ML IJ SOLN
0.5000 mg | Freq: Once | INTRAMUSCULAR | Status: AC
  Administered 2017-02-16: 0.5 mg via INTRAVENOUS
  Filled 2017-02-16: qty 1

## 2017-02-16 MED ORDER — KCL IN DEXTROSE-NACL 20-5-0.45 MEQ/L-%-% IV SOLN
INTRAVENOUS | Status: DC
  Administered 2017-02-16 – 2017-02-17 (×2): via INTRAVENOUS
  Filled 2017-02-16 (×2): qty 1000

## 2017-02-16 MED ORDER — MORPHINE SULFATE (PF) 4 MG/ML IV SOLN
2.0000 mg | INTRAVENOUS | Status: DC | PRN
  Administered 2017-02-16 – 2017-02-17 (×5): 2 mg via INTRAVENOUS
  Filled 2017-02-16 (×5): qty 1

## 2017-02-16 MED ORDER — CHLORHEXIDINE GLUCONATE 4 % EX LIQD: 60.0000 mL | Freq: Once | CUTANEOUS | Status: DC

## 2017-02-16 MED ORDER — CHLORHEXIDINE GLUCONATE 4 % EX LIQD
60.0000 mL | Freq: Once | CUTANEOUS | Status: AC
  Administered 2017-02-17: 4 via TOPICAL
  Filled 2017-02-16: qty 60

## 2017-02-16 MED ORDER — SENNA 8.6 MG PO TABS
1.0000 | ORAL_TABLET | Freq: Two times a day (BID) | ORAL | Status: DC
  Administered 2017-02-17 – 2017-02-19 (×4): 8.6 mg via ORAL
  Filled 2017-02-16 (×5): qty 1

## 2017-02-16 NOTE — ED Notes (Signed)
Off floor for testing 

## 2017-02-16 NOTE — ED Notes (Signed)
Pt's grandson [name] [facility]) called and asked for update on pt.  The patient Angelica Bell) is fully aware & A&O, gave permission to update her grandson on her condition.  He will be by later today.

## 2017-02-16 NOTE — H&P (Signed)
History and Physical    Angelica Bell [id number] DOB: 16-Oct-1932 DOA: 02/16/2017  PCP: Cindee Salt, MD   Patient coming from: home  Chief Complaint: fall  HPI: Angelica Bell is a 83 y.o. female with medical history significant for right hip fracture 2016 who was in her kitchen this afternoon when she lost her balance, tripped, and fell. Denies loc but does think she hit back of head when she fell. Complaining of significant left hip pain, unable to walk or move it well. No bleeding. No recent illness, no cough, no congestion, no dysuria or polyuria, no fevers.  Later, after speaking with grandson who witnessed fall, says no head trauma.   ED Course: pain medication, CT head, x-rays  Review of Systems: As per HPI otherwise 10 point review of systems negative.    Past Medical History:  Diagnosis Date  . Chronic back pain    with degenerative joint disease  . Chronic pain disorder    with narcotic management  . Dry eyes   . Hypertension   . Osteoporosis   . PE (pulmonary embolism)    After hysterectomy in 1967  . Pneumonia     Past Surgical History:  Procedure Laterality Date  . ABDOMINAL HYSTERECTOMY    . APPENDECTOMY    . BREAST ENHANCEMENT SURGERY    . HIP ARTHROPLASTY Right 12/24/2015   Procedure: ARTHROPLASTY BIPOLAR HIP (HEMIARTHROPLASTY);  Surgeon: Netta Cedars, MD;  Location: [facility];  Service: Orthopedics;  Laterality: Right;  . TONSILLECTOMY       reports that she quit smoking about 40 years ago. She quit after 13.00 years of use. She has never used smokeless tobacco. She reports that she does not drink alcohol or use drugs.  Allergies  Allergen Reactions  . Amoxicillin-Pot Clavulanate Hives and Diarrhea    Has patient had a PCN reaction causing immediate rash, facial/tongue/throat swelling, SOB or lightheadedness with hypotension: unknown Has patient had a PCN reaction causing severe rash involving mucus membranes or skin necrosis: yes Has patient  had a PCN reaction that required hospitalization : unknown Has patient had a PCN reaction occurring within the last 10 years: yes If all of the above answers are "NO", then may proceed with Cephalosporin use.   Marland Kitchen Lisinopril-Hydrochlorothiazide Other (See Comments)    Tired- no energy to do anything..  . Sulfonamide Derivatives Hives    Family History  Problem Relation Age of Onset  . Heart Problems Mother    Unacceptable: Noncontributory, unremarkable, or negative. Acceptable: Family history reviewed and not pertinent (If you reviewed it)   Medications - patient says she uses a 25 mcg fentanyl patch sometimes as needed - son thinks she also takes oxycodone prn and a baby aspirin  Physical Exam: Vitals:   02/16/17 1429  BP: 160/92  Pulse: 74  Resp: 16  Temp: 98.4 F (36.9 C)  TempSrc: Oral  SpO2: 99%      Constitutional: in moderate pain. frail Vitals:   02/16/17 1429  BP: 160/92  Pulse: 74  Resp: 16  Temp: 98.4 F (36.9 C)  TempSrc: Oral  SpO2: 99%   Eyes: PERRL, lids and conjunctivae normal ENMT: Mucous membranes are moist. Posterior pharynx clear of any exudate or lesions.Normal dentition.  Neck: normal, supple, no masses, no thyromegaly Respiratory: clear to auscultation bilaterally, no wheezing, no crackles. Normal respiratory effort. No accessory muscle use.  Cardiovascular: Regular rate and rhythm, no murmurs / rubs / gallops. Trace extremity edema. 2+ pedal pulses. Mild  systolic murmur Abdomen: no tenderness, no masses palpated. No hepatosplenomegaly. Bowel sounds positive.  Musculoskeletal: no clubbing / cyanosis. Scoliosis noted. External rotation of left hip. Skin: no rashes, lesions, ulcers. No induration Neurologic: CN 2-12 grossly intact. 4/5 strength right lower extremity, 2/5 strength left limited by pain Psychiatric: alert and oriented   Labs on Admission: I have personally reviewed following labs and imaging studies  CBC:  Recent  Labs Lab 02/16/17 1454 02/16/17 1508  WBC 15.0*  --   NEUTROABS 10.6*  --   HGB 12.5 12.9  HCT 37.9 38.0  MCV 85.6  --   PLT 300  --    Basic Metabolic Panel:  Recent Labs Lab 02/16/17 1454 02/16/17 1508  NA 140 141  K 3.2* 3.3*  CL 105 104  CO2 26  --   GLUCOSE 132* 134*  BUN 18 17  CREATININE 0.76 0.80  CALCIUM 9.6  --    Urine analysis:    Component Value Date/Time   COLORURINE YELLOW 12/25/2015 0208   APPEARANCEUR CLEAR 12/25/2015 0208   LABSPEC 1.026 12/25/2015 0208   PHURINE 5.5 12/25/2015 0208   GLUCOSEU NEGATIVE 12/25/2015 0208   HGBUR NEGATIVE 12/25/2015 0208   HGBUR negative 04/24/2009 1635   BILIRUBINUR NEGATIVE 12/25/2015 0208   BILIRUBINUR negative 05/29/2011 1439   KETONESUR NEGATIVE 12/25/2015 0208   PROTEINUR NEGATIVE 12/25/2015 0208   UROBILINOGEN 0.2 01/26/2014 1912   NITRITE NEGATIVE 12/25/2015 0208   LEUKOCYTESUR NEGATIVE 12/25/2015 0208    Radiological Exams on Admission: Dg Chest 2 View  Result Date: 02/16/2017 CLINICAL DATA:  Pain following fall EXAM: CHEST  2 VIEW COMPARISON:  August 02, 2016 FINDINGS: There is no edema or consolidation. Heart size and pulmonary vascularity are normal. No adenopathy. There is atherosclerotic calcification in the aorta. No pneumothorax. There is an old healed fracture proximal left humerus with remodeling. There is thoracolumbar levoscoliosis. IMPRESSION: No edema or consolidation. Aortic atherosclerosis. Old healed fracture with remodeling left proximal humerus. Marked scoliosis. Electronically Signed   By: Lowella Grip III M.D.   On: 02/16/2017 14:56   Ct Head Wo Contrast  Result Date: 02/16/2017 CLINICAL DATA:  83 year old female with history of trauma after a fall in her kitchen. Injury to the head. No loss of consciousness. EXAM: CT HEAD WITHOUT CONTRAST TECHNIQUE: Contiguous axial images were obtained from the base of the skull through the vertex without intravenous contrast. COMPARISON:  Head CT  04/19/2015. FINDINGS: Brain: Mild cerebral atrophy. Patchy and confluent areas of decreased attenuation are noted throughout the deep and periventricular white matter of the cerebral hemispheres bilaterally, compatible with chronic microvascular ischemic disease. Well-defined area of low attenuation in the right cerebellar hemisphere, new compared to the prior examination, but most compatible with an area of encephalomalacia from old infarct. No evidence of acute infarction, hemorrhage, hydrocephalus, extra-axial collection or mass lesion/mass effect. Vascular: Cerebrovascular atherosclerosis without acute abnormalities. Skull: Normal. Negative for fracture or focal lesion. Sinuses/Orbits: No acute finding. Other: None. IMPRESSION: 1. No evidence of significant acute traumatic injury to the skull or brain. 2. No acute intracranial abnormalities. 3. Mild cerebral atrophy with chronic microvascular ischemic changes and evidence of remote right cerebellar infarct, as above. 4. Cerebrovascular atherosclerosis. Electronically Signed   By: Vinnie Langton M.D.   On: 02/16/2017 15:03   Dg Hip Unilat W Or Wo Pelvis 2-3 Views Left  Result Date: 02/16/2017 CLINICAL DATA:  Pain following fall EXAM: DG HIP (WITH OR WITHOUT PELVIS) 2-3V LEFT COMPARISON:  None. FINDINGS: Frontal  pelvis as well as frontal and lateral left hip images were obtained. There is an intertrochanteric femur fracture on the left with medial angulation distally. There is a total hip prosthesis on the right which appears well seated. No dislocation. There is moderate narrowing of the left hip joint. There is lower lumbar levoscoliosis. There is atherosclerotic calcification in the distal aorta and right common iliac artery. IMPRESSION: Intertrochanteric femur fracture on the left with medial angulation distally. No dislocation. Total hip prosthesis on the right, well-seated. Narrowing left hip joint. Aortoiliac atherosclerosis. Lower lumbar  levoscoliosis. Electronically Signed   By: Lowella Grip III M.D.   On: 02/16/2017 14:58     Assessment/Plan Principal Problem:   Closed left hip fracture (HCC) Active Problems:   Chronic pain   Essential hypertension  53f here with left hip fracture s/p mechanical fall at home  # Left intertrochanteric hip fracture - ortho w/ plans for surgical repair tomorrow, will transfer to [facility] for that procedure - holding medical dvt ppx per ortho; placing SCDs - NPO past midnight - IV fluids @ 100 ml/hr - type and screen ordered - hip fracture nursing orders placed - foley catheter placed as patient unable to urinate 2/2 pain  # Head trauma - non-focal neuro exam, no signs skull fx on exam. CT head negative for acute process. Grandson says no actual head trauma. - continue to monitor  # Acute on chronic pain - unable to obtain reliable medication review from patient, but per chart records chronic pain on chronic opioids - received dilaudid 0.5 mg in ED and per nursing desat because of that; currently complaining of severe pain - morphine 2 mg IV q3 hrs, will adjust as needed  # Hypertension - hx of, pt says controlled off meds at home. Here systolic 0000000 in the setting of pain - pain control as above, holding antihypertensives in preparation for surgery  # Hypokalemia - mild - K with fluids    DVT prophylaxis: holding per ortho; scds placed Code Status: dnr Family Communication: Haynes Bast Case [phone], has power of attorney Disposition Plan: snf  Consults called: Orthopedics Admission status: floor   Desma Maxim MD [facility] Pager [phone]  If 7PM-7AM, please contact night-coverage [email or web address] Password [facility]  02/16/2017, 4:34 PM

## 2017-02-16 NOTE — ED Notes (Signed)
Unable to collect labs patient is out of the room

## 2017-02-16 NOTE — ED Notes (Signed)
Received bedside report from [facility]

## 2017-02-16 NOTE — ED Provider Notes (Signed)
[place] DEPT Provider Note   CSN: [id number] Arrival date & time: 02/16/17  1409     History   Chief Complaint Chief Complaint  Patient presents with  . Fall    HPI Angelica Bell is a 83 y.o. female.  83 yo F with a chief complaint of a fall. Patient thinks it was a mechanical fall. Says she slipped in the kitchen. Landed on her left side. Thinks that she did hit her head. Complaining mostly of left hip pain. Was unable to stand up and ambulate after the event. Arrived via EMS. EMS feels that the patient has an obvious deformity to the left hip.   The history is provided by the patient.  Illness  This is a new problem. The current episode started 6 to 12 hours ago. The problem occurs constantly. The problem has not changed since onset.Pertinent negatives include no chest pain, no headaches and no shortness of breath. Nothing aggravates the symptoms. Nothing relieves the symptoms. She has tried nothing for the symptoms. The treatment provided no relief.    Past Medical History:  Diagnosis Date  . Chronic back pain    with degenerative joint disease  . Chronic pain disorder    with narcotic management  . Dry eyes   . Hypertension   . Osteoporosis   . PE (pulmonary embolism)    After hysterectomy in 1967  . Pneumonia     Patient Active Problem List   Diagnosis Date Noted  . Hip fracture, right ([place]) 12/25/2015  . Chronic pain 12/24/2015  . Fracture of femoral neck, right, closed ([place]) 12/24/2015  . Essential hypertension 12/24/2015  . Community acquired pneumonia 04/15/2013  . Hypokalemia 04/15/2013  . Labial cyst 02/08/2012  . POSTMENOPAUSAL SYNDROME 11/28/2009  . PERIPHERAL NEUROPATHY, LOWER EXTREMITY, RIGHT 02/22/2009  . INSOMNIA-SLEEP DISORDER-UNSPEC 10/12/2008  . Lumbago 08/02/2008  . 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 ANXIETY DEPRESSION 12/16/2007  . HYPERLIPIDEMIA 07/08/2007  . Osteoarthritis 07/08/2007  . Osteoporosis 07/08/2007    . SCOLIOSIS NEC 07/08/2007    Past Surgical History:  Procedure Laterality Date  . ABDOMINAL HYSTERECTOMY    . APPENDECTOMY    . BREAST ENHANCEMENT SURGERY    . HIP ARTHROPLASTY Right 12/24/2015   Procedure: ARTHROPLASTY BIPOLAR HIP (HEMIARTHROPLASTY);  Surgeon: Netta Cedars, MD;  Location: [facility];  Service: Orthopedics;  Laterality: Right;  . TONSILLECTOMY      OB History    No data available       Home Medications    Prior to Admission medications   Medication Sig Start Date End Date Taking? Authorizing Provider  aspirin EC 81 MG tablet Take 81 mg by mouth daily.    Historical Provider, MD  BIOTIN PO Take 1 tablet by mouth daily.    Historical Provider, MD  doxycycline (VIBRAMYCIN) 100 MG capsule Take 100 mg by mouth 2 (two) times daily. X 8  more days    Historical Provider, MD  enoxaparin (LOVENOX) 40 MG/0.4ML injection Inject 0.4 mLs (40 mg total) into the skin daily. Patient taking differently: Inject 40 mg into the skin daily. X 13 more days then discontinue 12/27/15   Cline Crock, PA-C  fentaNYL (DURAGESIC - DOSED MCG/HR) 50 MCG/HR Place 1 patch (50 mcg total) onto the skin every 3 (three) days. 12/27/15   Modena Jansky, MD  fluorometholone (FML) 0.1 % ophthalmic suspension Place 1 drop into both eyes daily.  03/25/15   Historical Provider, MD  HYDROcodone-acetaminophen (NORCO) 5-325 MG tablet  1-2 by mouth every 4-6 hours as needed DO NOT EXCEED 4GM OF APAP IN 24 HOURS FROM ALL SOURCES 12/28/15   Lauree Chandler, NP  Melatonin 3 MG TABS Take 3 mg by mouth at bedtime as needed.    Historical Provider, MD  methocarbamol (ROBAXIN) 500 MG tablet Take 1 tablet (500 mg total) by mouth every 6 (six) hours as needed for muscle spasms. 12/27/15   Cline Crock, PA-C  Multiple Vitamin (MULTIVITAMIN) tablet Take 1 tablet by mouth daily.    Historical Provider, MD  oxycodone (ROXICODONE) 30 MG immediate release tablet Take one tablet by mouth four times daily as  needed for severe pain 12/29/15   Tiffany L Reed, DO  saccharomyces boulardii (FLORASTOR) 250 MG capsule Take 250 mg by mouth 2 (two) times daily. X 11 more days    Historical Provider, MD  sennosides-docusate sodium (SENOKOT-S) 8.6-50 MG tablet Take 2 tablets by mouth 2 (two) times daily.    Historical Provider, MD    Family History Family History  Problem Relation Age of Onset  . Heart Problems Mother     Social History Social History  Substance Use Topics  . Smoking status: Former Smoker    Years: 13.00    Quit date: 04/15/1976  . Smokeless tobacco: Never Used  . Alcohol use No     Allergies   Amoxicillin-pot clavulanate; Lisinopril-hydrochlorothiazide; and Sulfonamide derivatives   Review of Systems Review of Systems  Constitutional: Negative for chills and fever.  HENT: Negative for congestion and rhinorrhea.   Eyes: Negative for redness and visual disturbance.  Respiratory: Negative for shortness of breath and wheezing.   Cardiovascular: Negative for chest pain and palpitations.  Gastrointestinal: Negative for nausea and vomiting.  Genitourinary: Negative for dysuria and urgency.  Musculoskeletal: Positive for arthralgias and myalgias.  Skin: Negative for pallor and wound.  Neurological: Negative for dizziness and headaches.     Physical Exam Updated Vital Signs BP 160/92 (BP Location: Left Arm)   Pulse 74   Temp 98.4 F (36.9 C) (Oral)   Resp 16   SpO2 99%   Physical Exam  Constitutional: She is oriented to person, place, and time. She appears well-developed and well-nourished. No distress.  HENT:  Head: Normocephalic and atraumatic.  Eyes: EOM are normal. Pupils are equal, round, and reactive to light.  Neck: Normal range of motion. Neck supple.  Cardiovascular: Normal rate and regular rhythm.  Exam reveals no gallop and no friction rub.   No murmur heard. Pulmonary/Chest: Effort normal. She has no wheezes. She has no rales.  Abdominal: Soft. She  exhibits no distension and no mass. There is no tenderness. There is no guarding.  Musculoskeletal: She exhibits edema, tenderness and deformity.  PMS intact distally  Neurological: She is alert and oriented to person, place, and time.  Skin: Skin is warm and dry. She is not diaphoretic.  Psychiatric: She has a normal mood and affect. Her behavior is normal.  Nursing note and vitals reviewed.    ED Treatments / Results  Labs (all labs ordered are listed, but only abnormal results are displayed) Labs Reviewed  CBC WITH DIFFERENTIAL/PLATELET - Abnormal; Notable for the following:       Result Value   WBC 15.0 (*)    Neutro Abs 10.6 (*)    Monocytes Absolute 1.1 (*)    All other components within normal limits  BASIC METABOLIC PANEL - Abnormal; Notable for the following:    Potassium 3.2 (*)  Glucose, Bld 132 (*)    All other components within normal limits  I-STAT CHEM 8, ED - Abnormal; Notable for the following:    Potassium 3.3 (*)    Glucose, Bld 134 (*)    All other components within normal limits  URINALYSIS, ROUTINE W REFLEX MICROSCOPIC  PROTIME-INR  APTT    EKG  EKG Interpretation  Date/Time:  Sunday February 16 2017 15:32:06 EST Ventricular Rate:  83 PR Interval:    QRS Duration: 91 QT Interval:  375 QTC Calculation: 441 R Axis:   23 Text Interpretation:  Sinus rhythm Low voltage, precordial leads Abnormal R-wave progression, early transition No significant change since last tracing Confirmed by Yeslin Delio MD, DANIEL [phone]) on 02/16/2017 3:35:07 PM       Radiology Dg Chest 2 View  Result Date: 02/16/2017 CLINICAL DATA:  Pain following fall EXAM: CHEST  2 VIEW COMPARISON:  August 02, 2016 FINDINGS: There is no edema or consolidation. Heart size and pulmonary vascularity are normal. No adenopathy. There is atherosclerotic calcification in the aorta. No pneumothorax. There is an old healed fracture proximal left humerus with remodeling. There is thoracolumbar  levoscoliosis. IMPRESSION: No edema or consolidation. Aortic atherosclerosis. Old healed fracture with remodeling left proximal humerus. Marked scoliosis. Electronically Signed   By: Lowella Grip III M.D.   On: 02/16/2017 14:56   Ct Head Wo Contrast  Result Date: 02/16/2017 CLINICAL DATA:  83 year old female with history of trauma after a fall in her kitchen. Injury to the head. No loss of consciousness. EXAM: CT HEAD WITHOUT CONTRAST TECHNIQUE: Contiguous axial images were obtained from the base of the skull through the vertex without intravenous contrast. COMPARISON:  Head CT 04/19/2015. FINDINGS: Brain: Mild cerebral atrophy. Patchy and confluent areas of decreased attenuation are noted throughout the deep and periventricular white matter of the cerebral hemispheres bilaterally, compatible with chronic microvascular ischemic disease. Well-defined area of low attenuation in the right cerebellar hemisphere, new compared to the prior examination, but most compatible with an area of encephalomalacia from old infarct. No evidence of acute infarction, hemorrhage, hydrocephalus, extra-axial collection or mass lesion/mass effect. Vascular: Cerebrovascular atherosclerosis without acute abnormalities. Skull: Normal. Negative for fracture or focal lesion. Sinuses/Orbits: No acute finding. Other: None. IMPRESSION: 1. No evidence of significant acute traumatic injury to the skull or brain. 2. No acute intracranial abnormalities. 3. Mild cerebral atrophy with chronic microvascular ischemic changes and evidence of remote right cerebellar infarct, as above. 4. Cerebrovascular atherosclerosis. Electronically Signed   By: Vinnie Langton M.D.   On: 02/16/2017 15:03   Dg Hip Unilat W Or Wo Pelvis 2-3 Views Left  Result Date: 02/16/2017 CLINICAL DATA:  Pain following fall EXAM: DG HIP (WITH OR WITHOUT PELVIS) 2-3V LEFT COMPARISON:  None. FINDINGS: Frontal pelvis as well as frontal and lateral left hip images were  obtained. There is an intertrochanteric femur fracture on the left with medial angulation distally. There is a total hip prosthesis on the right which appears well seated. No dislocation. There is moderate narrowing of the left hip joint. There is lower lumbar levoscoliosis. There is atherosclerotic calcification in the distal aorta and right common iliac artery. IMPRESSION: Intertrochanteric femur fracture on the left with medial angulation distally. No dislocation. Total hip prosthesis on the right, well-seated. Narrowing left hip joint. Aortoiliac atherosclerosis. Lower lumbar levoscoliosis. Electronically Signed   By: Lowella Grip III M.D.   On: 02/16/2017 14:58    Procedures Procedures (including critical care time)  Medications Ordered in ED Medications  HYDROmorphone (DILAUDID) injection 0.5 mg (0.5 mg Intravenous Given 02/16/17 1500)  ondansetron (ZOFRAN) injection 4 mg (4 mg Intravenous Given 02/16/17 1500)     Initial Impression / Assessment and Plan / ED Course  I have reviewed the triage vital signs and the nursing notes.  Pertinent labs & imaging results that were available during my care of the patient were reviewed by me and considered in my medical decision making (see chart for details).     83 yo F With a chief complaint of left hip pain. Patient has a fracture noted on x-ray. We'll discuss with orthopedics.  Discussed with Dr. Onnie Graham, ortho, surgery likely done tomorrow morning, recommend transfer [facility] for [facility].   The patients results and plan were reviewed and discussed.   Any x-rays performed were independently reviewed by myself.   Differential diagnosis were considered with the presenting HPI.  Medications  HYDROmorphone (DILAUDID) injection 0.5 mg (0.5 mg Intravenous Given 02/16/17 1500)  ondansetron (ZOFRAN) injection 4 mg (4 mg Intravenous Given 02/16/17 1500)    Vitals:   02/16/17 1429  BP: 160/92  Pulse: 74  Resp: 16  Temp: 98.4 F (36.9 C)    TempSrc: Oral  SpO2: 99%    Final diagnoses:  Closed displaced intertrochanteric fracture of left femur, initial encounter [facility])    Admission/ observation were discussed with the admitting physician, patient and/or family and they are comfortable with the plan.       Final Clinical Impressions(s) / ED Diagnoses   Final diagnoses:  Closed displaced intertrochanteric fracture of left femur, initial encounter [facility])    New Prescriptions New Prescriptions   No medications on file     Deno Etienne, DO 02/16/17 1536

## 2017-02-16 NOTE — ED Triage Notes (Signed)
Per EMS-fell in her kitchen due to loss of balance-did hit head but did not loose consciousness-obvious left hip deformity-in a position of comfort-100 mg of Fentanyl given by paramedic on scene-

## 2017-02-16 NOTE — ED Notes (Signed)
Bed: [id number] Expected date: 02/16/17 Expected time: 1:53 PM Means of arrival: Ambulance Comments: Hip deformity

## 2017-02-16 NOTE — Consult Note (Signed)
Reason for Consult:left hip fracture Referring Physician: EDP  HPI: Angelica Bell is an 83 y.o. female s/p mechanical fall at home earlier today with immediate c/o left hip pain and inability to bear weight. Brought by EMS to [facility] ED. Known to [facility] from previous right hip fracture treated by Dr. Veverly Fells.  Past Medical History:  Diagnosis Date  . Chronic back pain    with degenerative joint disease  . Chronic pain disorder    with narcotic management  . Dry eyes   . Hypertension   . Osteoporosis   . PE (pulmonary embolism)    After hysterectomy in 1967  . Pneumonia     Past Surgical History:  Procedure Laterality Date  . ABDOMINAL HYSTERECTOMY    . APPENDECTOMY    . BREAST ENHANCEMENT SURGERY    . HIP ARTHROPLASTY Right 12/24/2015   Procedure: ARTHROPLASTY BIPOLAR HIP (HEMIARTHROPLASTY);  Surgeon: Netta Cedars, MD;  Location: [facility] ORS;  Service: Orthopedics;  Laterality: Right;  . TONSILLECTOMY      Family History  Problem Relation Age of Onset  . Heart Problems Mother     Social History:  reports that she quit smoking about 40 years ago. She quit after 13.00 years of use. She has never used smokeless tobacco. She reports that she does not drink alcohol or use drugs.  Allergies:  Allergies  Allergen Reactions  . Amoxicillin-Pot Clavulanate Hives and Diarrhea    Has patient had a PCN reaction causing immediate rash, facial/tongue/throat swelling, SOB or lightheadedness with hypotension: unknown Has patient had a PCN reaction causing severe rash involving mucus membranes or skin necrosis: yes Has patient had a PCN reaction that required hospitalization : unknown Has patient had a PCN reaction occurring within the last 10 years: yes If all of the above answers are "NO", then may proceed with Cephalosporin use.   Marland Kitchen Lisinopril-Hydrochlorothiazide Other (See Comments)    Tired- no energy to do anything..  . Sulfonamide Derivatives Hives    Medications: I  have reviewed the patient's current medication  Results for orders placed or performed during the hospital encounter of 02/16/17 (from the past 48 hour(s))  CBC with Differential     Status: Abnormal   Collection Time: 02/16/17  2:54 PM  Result Value Ref Range   WBC 15.0 (H) 4.0 - 10.5 K/uL   RBC 4.43 3.87 - 5.11 MIL/uL   Hemoglobin 12.5 12.0 - 15.0 g/dL   HCT 37.9 36.0 - 46.0 %   MCV 85.6 78.0 - 100.0 fL   MCH 28.2 26.0 - 34.0 pg   MCHC 33.0 30.0 - 36.0 g/dL   RDW 13.4 11.5 - 15.5 %   Platelets 300 150 - 400 K/uL   Neutrophils Relative % 71 %   Neutro Abs 10.6 (H) 1.7 - 7.7 K/uL   Lymphocytes Relative 19 %   Lymphs Abs 2.9 0.7 - 4.0 K/uL   Monocytes Relative 7 %   Monocytes Absolute 1.1 (H) 0.1 - 1.0 K/uL   Eosinophils Relative 3 %   Eosinophils Absolute 0.4 0.0 - 0.7 K/uL   Basophils Relative 0 %   Basophils Absolute 0.1 0.0 - 0.1 K/uL  Basic metabolic panel     Status: Abnormal   Collection Time: 02/16/17  2:54 PM  Result Value Ref Range   Sodium 140 135 - 145 mmol/L   Potassium 3.2 (L) 3.5 - 5.1 mmol/L   Chloride 105 101 - 111 mmol/L   CO2 26 22 - 32 mmol/L  Glucose, Bld 132 (H) 65 - 99 mg/dL   BUN 18 6 - 20 mg/dL   Creatinine, Ser 0.76 0.44 - 1.00 mg/dL   Calcium 9.6 8.9 - 10.3 mg/dL   GFR calc non Af Amer >60 >60 mL/min   GFR calc Af Amer >60 >60 mL/min    Comment: (NOTE) The eGFR has been calculated using the CKD EPI equation. This calculation has not been validated in all clinical situations. eGFR's persistently <60 mL/min signify possible Chronic Kidney Disease.    Anion gap 9 5 - 15  I-Stat Chem 8, ED     Status: Abnormal   Collection Time: 02/16/17  3:08 PM  Result Value Ref Range   Sodium 141 135 - 145 mmol/L   Potassium 3.3 (L) 3.5 - 5.1 mmol/L   Chloride 104 101 - 111 mmol/L   BUN 17 6 - 20 mg/dL   Creatinine, Ser 0.80 0.44 - 1.00 mg/dL   Glucose, Bld 134 (H) 65 - 99 mg/dL   Calcium, Ion 1.21 1.15 - 1.40 mmol/L   TCO2 27 0 - 100 mmol/L    Hemoglobin 12.9 12.0 - 15.0 g/dL   HCT 38.0 36.0 - 46.0 %    Dg Chest 2 View  Result Date: 02/16/2017 CLINICAL DATA:  Pain following fall EXAM: CHEST  2 VIEW COMPARISON:  August 02, 2016 FINDINGS: There is no edema or consolidation. Heart size and pulmonary vascularity are normal. No adenopathy. There is atherosclerotic calcification in the aorta. No pneumothorax. There is an old healed fracture proximal left humerus with remodeling. There is thoracolumbar levoscoliosis. IMPRESSION: No edema or consolidation. Aortic atherosclerosis. Old healed fracture with remodeling left proximal humerus. Marked scoliosis. Electronically Signed   By: Lowella Grip III M.D.   On: 02/16/2017 14:56   Ct Head Wo Contrast  Result Date: 02/16/2017 CLINICAL DATA:  83 year old female with history of trauma after a fall in her kitchen. Injury to the head. No loss of consciousness. EXAM: CT HEAD WITHOUT CONTRAST TECHNIQUE: Contiguous axial images were obtained from the base of the skull through the vertex without intravenous contrast. COMPARISON:  Head CT 04/19/2015. FINDINGS: Brain: Mild cerebral atrophy. Patchy and confluent areas of decreased attenuation are noted throughout the deep and periventricular white matter of the cerebral hemispheres bilaterally, compatible with chronic microvascular ischemic disease. Well-defined area of low attenuation in the right cerebellar hemisphere, new compared to the prior examination, but most compatible with an area of encephalomalacia from old infarct. No evidence of acute infarction, hemorrhage, hydrocephalus, extra-axial collection or mass lesion/mass effect. Vascular: Cerebrovascular atherosclerosis without acute abnormalities. Skull: Normal. Negative for fracture or focal lesion. Sinuses/Orbits: No acute finding. Other: None. IMPRESSION: 1. No evidence of significant acute traumatic injury to the skull or brain. 2. No acute intracranial abnormalities. 3. Mild cerebral atrophy with  chronic microvascular ischemic changes and evidence of remote right cerebellar infarct, as above. 4. Cerebrovascular atherosclerosis. Electronically Signed   By: Vinnie Langton M.D.   On: 02/16/2017 15:03   Dg Hip Unilat W Or Wo Pelvis 2-3 Views Left  Result Date: 02/16/2017 CLINICAL DATA:  Pain following fall EXAM: DG HIP (WITH OR WITHOUT PELVIS) 2-3V LEFT COMPARISON:  None. FINDINGS: Frontal pelvis as well as frontal and lateral left hip images were obtained. There is an intertrochanteric femur fracture on the left with medial angulation distally. There is a total hip prosthesis on the right which appears well seated. No dislocation. There is moderate narrowing of the left hip joint. There is lower  lumbar levoscoliosis. There is atherosclerotic calcification in the distal aorta and right common iliac artery. IMPRESSION: Intertrochanteric femur fracture on the left with medial angulation distally. No dislocation. Total hip prosthesis on the right, well-seated. Narrowing left hip joint. Aortoiliac atherosclerosis. Lower lumbar levoscoliosis. Electronically Signed   By: Lowella Grip III M.D.   On: 02/16/2017 14:58     Vitals Temp:  [98.4 F (36.9 C)] 98.4 F (36.9 C) (02/18 1429) Pulse Rate:  [74] 74 (02/18 1429) Resp:  [16] 16 (02/18 1429) BP: (160)/(92) 160/92 (02/18 1429) SpO2:  [99 %] 99 % (02/18 1429) There is no height or weight on file to calculate BMI.  Physical Exam: Thin WF c/o severe left hip pain. Limited cooperation with exam secondary to hip pain. Neck Gorje Iyer and non tender, no clavicular crepitance or tenderness, no gross bone or joint instability UE's. RLE no gross bone or joint instability, grossly n/v intact distally. LLE foreshortened and externally rotated. Diffusely tender to palpation, grossly n/v intact distally.    Assessment/Plan: Impression:  Displaced left intertrochanteric hip fracture Recent hx right hip fx rx with hemiarthroplasty  Treatment:  I have  discussed with Ms. Mohar treatment options and risks vs benefits thereof. As with her right hip fracture, for this left hip intertrochanteric fracture recommended treatment would be ORIF. I have communicated with my partner Dr. Victorino December who has kindly offered to take over orthopedic care tomorrow and anticipates surgery tomorrow. NPO after MN. [facility].  Kerith Sherley M Tykeem Lanzer 02/16/2017, 4:01 PM  Contact # [phone]

## 2017-02-17 ENCOUNTER — Encounter (HOSPITAL_COMMUNITY)
Admission: EM | Disposition: A | Discharge status: Home / Self Care | Payer: Self-pay | Source: Home / Self Care | Attending: Family Medicine

## 2017-02-17 ENCOUNTER — Inpatient Hospital Stay (HOSPITAL_COMMUNITY): Payer: Medicare Other | Admitting: Certified Registered Nurse Anesthetist

## 2017-02-17 ENCOUNTER — Inpatient Hospital Stay (HOSPITAL_COMMUNITY): Payer: Medicare Other

## 2017-02-17 ENCOUNTER — Encounter (HOSPITAL_COMMUNITY): Payer: Self-pay | Admitting: Certified Registered Nurse Anesthetist

## 2017-02-17 DIAGNOSIS — I10 Essential (primary) hypertension: Secondary | ICD-10-CM

## 2017-02-17 DIAGNOSIS — E876 Hypokalemia: Secondary | ICD-10-CM

## 2017-02-17 DIAGNOSIS — S72142A Displaced intertrochanteric fracture of left femur, initial encounter for closed fracture: Principal | ICD-10-CM

## 2017-02-17 HISTORY — PX: FEMUR IM NAIL: SHX1597

## 2017-02-17 LAB — CREATININE, SERUM
CREATININE: 0.67 mg/dL (ref 0.44–1.00)
GFR calc non Af Amer: >60 mL/min (ref >60)

## 2017-02-17 LAB — CBC
HCT: 31.2 % — ABNORMAL LOW (ref 36.0–46.0)
HEMOGLOBIN: 10.1 g/dL — AB (ref 12.0–15.0)
MCH: 28.1 pg (ref 26.0–34.0)
MCHC: 32.4 g/dL (ref 30.0–36.0)
MCV: 86.7 fL (ref 78.0–100.0)
Platelets: 200 10*3/uL (ref 150–400)
RBC: 3.6 MIL/uL — AB (ref 3.87–5.11)
RDW: 13.7 % (ref 11.5–15.5)
WBC: 13.1 10*3/uL — AB (ref 4.0–10.5)

## 2017-02-17 SURGERY — INSERTION, INTRAMEDULLARY ROD, FEMUR
Anesthesia: General | Site: Hip | Laterality: Left

## 2017-02-17 MED ORDER — OXYCODONE HCL 5 MG PO TABS
30.0000 mg | ORAL_TABLET | Freq: Three times a day (TID) | ORAL | Status: DC | PRN
  Administered 2017-02-18 – 2017-02-19 (×5): 30 mg via ORAL
  Filled 2017-02-17 (×5): qty 6

## 2017-02-17 MED ORDER — ONDANSETRON HCL 4 MG PO TABS: 4.0000 mg | ORAL_TABLET | Freq: Four times a day (QID) | ORAL | Status: DC | PRN

## 2017-02-17 MED ORDER — FENTANYL CITRATE (PF) 100 MCG/2ML IJ SOLN
INTRAMUSCULAR | Status: AC
Start: 2017-02-17 — End: 2017-02-17
  Filled 2017-02-17: qty 2

## 2017-02-17 MED ORDER — METHOCARBAMOL 500 MG PO TABS
500.0000 mg | ORAL_TABLET | Freq: Four times a day (QID) | ORAL | Status: DC | PRN
  Administered 2017-02-18 (×3): 500 mg via ORAL
  Filled 2017-02-17 (×4): qty 1

## 2017-02-17 MED ORDER — MORPHINE SULFATE (PF) 2 MG/ML IV SOLN
2.0000 mg | INTRAVENOUS | Status: DC | PRN
  Administered 2017-02-17 – 2017-02-18 (×2): 2 mg via INTRAVENOUS
  Filled 2017-02-17 (×4): qty 1

## 2017-02-17 MED ORDER — MUPIROCIN 2 % EX OINT
1.0000 "application " | TOPICAL_OINTMENT | Freq: Two times a day (BID) | CUTANEOUS | Status: DC
  Administered 2017-02-17 – 2017-02-19 (×6): 1 via NASAL
  Filled 2017-02-17: qty 22

## 2017-02-17 MED ORDER — ROCURONIUM BROMIDE 100 MG/10ML IV SOLN
INTRAVENOUS | Status: DC | PRN
  Administered 2017-02-17: 35 mg via INTRAVENOUS
  Administered 2017-02-17: 15 mg via INTRAVENOUS

## 2017-02-17 MED ORDER — ONDANSETRON HCL 4 MG/2ML IJ SOLN
INTRAMUSCULAR | Status: DC | PRN
  Administered 2017-02-17: 4 mg via INTRAVENOUS

## 2017-02-17 MED ORDER — LACTATED RINGERS IV SOLN
INTRAVENOUS | Status: DC
  Administered 2017-02-17 (×2): via INTRAVENOUS
  Administered 2017-02-17: 50 mL/h via INTRAVENOUS

## 2017-02-17 MED ORDER — OXYCODONE HCL 5 MG PO TABS
5.0000 mg | ORAL_TABLET | ORAL | Status: DC | PRN
  Administered 2017-02-17: 5 mg via ORAL
  Filled 2017-02-17: qty 1

## 2017-02-17 MED ORDER — CHLORHEXIDINE GLUCONATE CLOTH 2 % EX PADS
6.0000 | MEDICATED_PAD | Freq: Every day | CUTANEOUS | Status: DC
  Administered 2017-02-17 – 2017-02-19 (×3): 6 via TOPICAL

## 2017-02-17 MED ORDER — METOCLOPRAMIDE HCL 5 MG PO TABS: 5.0000 mg | ORAL_TABLET | Freq: Three times a day (TID) | ORAL | Status: DC | PRN

## 2017-02-17 MED ORDER — DOCUSATE SODIUM 100 MG PO CAPS
100.0000 mg | ORAL_CAPSULE | Freq: Two times a day (BID) | ORAL | Status: DC
  Administered 2017-02-17 – 2017-02-19 (×4): 100 mg via ORAL
  Filled 2017-02-17 (×4): qty 1

## 2017-02-17 MED ORDER — PHENYLEPHRINE HCL 10 MG/ML IJ SOLN
INTRAMUSCULAR | Status: DC | PRN
  Administered 2017-02-17: 15 ug/min via INTRAVENOUS

## 2017-02-17 MED ORDER — LIDOCAINE HCL (CARDIAC) 20 MG/ML IV SOLN
INTRAVENOUS | Status: DC | PRN
  Administered 2017-02-17: 20 mg via INTRAVENOUS

## 2017-02-17 MED ORDER — SUGAMMADEX SODIUM 200 MG/2ML IV SOLN
INTRAVENOUS | Status: DC | PRN
  Administered 2017-02-17: 150 mg via INTRAVENOUS

## 2017-02-17 MED ORDER — FENTANYL CITRATE (PF) 100 MCG/2ML IJ SOLN
INTRAMUSCULAR | Status: DC | PRN
  Administered 2017-02-17: 50 ug via INTRAVENOUS

## 2017-02-17 MED ORDER — DEXAMETHASONE SODIUM PHOSPHATE 10 MG/ML IJ SOLN
INTRAMUSCULAR | Status: DC | PRN
  Administered 2017-02-17: 8 mg via INTRAVENOUS

## 2017-02-17 MED ORDER — CLINDAMYCIN PHOSPHATE 600 MG/50ML IV SOLN
600.0000 mg | Freq: Four times a day (QID) | INTRAVENOUS | Status: DC
  Administered 2017-02-18 (×2): 600 mg via INTRAVENOUS
  Filled 2017-02-17 (×3): qty 50

## 2017-02-17 MED ORDER — ACETAMINOPHEN 650 MG RE SUPP: 650.0000 mg | Freq: Four times a day (QID) | RECTAL | Status: DC | PRN

## 2017-02-17 MED ORDER — ACETAMINOPHEN 325 MG PO TABS
650.0000 mg | ORAL_TABLET | Freq: Four times a day (QID) | ORAL | Status: DC | PRN
  Administered 2017-02-18 – 2017-02-19 (×4): 650 mg via ORAL
  Filled 2017-02-17 (×4): qty 2

## 2017-02-17 MED ORDER — METHOCARBAMOL 1000 MG/10ML IJ SOLN
500.0000 mg | Freq: Four times a day (QID) | INTRAVENOUS | Status: DC | PRN
  Filled 2017-02-17: qty 5

## 2017-02-17 MED ORDER — ENOXAPARIN SODIUM 30 MG/0.3ML ~~LOC~~ SOLN
30.0000 mg | SUBCUTANEOUS | Status: DC
  Administered 2017-02-18 – 2017-02-19 (×2): 30 mg via SUBCUTANEOUS
  Filled 2017-02-17 (×2): qty 0.3

## 2017-02-17 MED ORDER — ONDANSETRON HCL 4 MG/2ML IJ SOLN: 4.0000 mg | Freq: Four times a day (QID) | INTRAMUSCULAR | Status: DC | PRN

## 2017-02-17 MED ORDER — PROPOFOL 10 MG/ML IV BOLUS
INTRAVENOUS | Status: DC | PRN
  Administered 2017-02-17: 70 mg via INTRAVENOUS

## 2017-02-17 MED ORDER — METOCLOPRAMIDE HCL 5 MG/ML IJ SOLN: 5.0000 mg | Freq: Three times a day (TID) | INTRAMUSCULAR | Status: DC | PRN

## 2017-02-17 MED ORDER — KETOROLAC TROMETHAMINE 15 MG/ML IJ SOLN
7.5000 mg | Freq: Four times a day (QID) | INTRAMUSCULAR | Status: DC
  Administered 2017-02-17 – 2017-02-18 (×2): 7.5 mg via INTRAVENOUS
  Filled 2017-02-17 (×2): qty 1

## 2017-02-17 MED ORDER — 0.9 % SODIUM CHLORIDE (POUR BTL) OPTIME
TOPICAL | Status: DC | PRN
  Administered 2017-02-17: 1000 mL

## 2017-02-17 SURGICAL SUPPLY — 51 items
BIT DRILL FLUTED FEMUR 4.2/3 (BIT) ×2 IMPLANT
BLADE SURG 15 STRL LF DISP TIS (BLADE) ×1 IMPLANT
BLADE SURG 15 STRL SS (BLADE)
BNDG COHESIVE 4X5 TAN NS LF (GAUZE/BANDAGES/DRESSINGS) ×1 IMPLANT
BNDG COHESIVE 6X5 TAN STRL LF (GAUZE/BANDAGES/DRESSINGS) ×2 IMPLANT
BNDG GAUZE ELAST 4 BULKY (GAUZE/BANDAGES/DRESSINGS) ×1 IMPLANT
COVER PERINEAL POST (MISCELLANEOUS) ×3 IMPLANT
COVER SURGICAL LIGHT HANDLE (MISCELLANEOUS) ×3 IMPLANT
DRAPE C-ARM 42X72 X-RAY (DRAPES) ×2 IMPLANT
DRAPE INCISE IOBAN 66X45 STRL (DRAPES) ×2 IMPLANT
DRAPE PROXIMA HALF (DRAPES) IMPLANT
DRAPE STERI IOBAN 125X83 (DRAPES) ×3 IMPLANT
DRSG MEPILEX BORDER 4X4 (GAUZE/BANDAGES/DRESSINGS) ×1 IMPLANT
DRSG MEPILEX BORDER 4X8 (GAUZE/BANDAGES/DRESSINGS) ×1 IMPLANT
DRSG PAD ABDOMINAL 8X10 ST (GAUZE/BANDAGES/DRESSINGS) ×2 IMPLANT
DURAPREP 26ML APPLICATOR (WOUND CARE) ×3 IMPLANT
ELECT CAUTERY BLADE 6.4 (BLADE) ×3 IMPLANT
ELECT REM PT RETURN 9FT ADLT (ELECTROSURGICAL) ×3
ELECTRODE REM PT RTRN 9FT ADLT (ELECTROSURGICAL) ×1 IMPLANT
FACESHIELD WRAPAROUND (MASK) IMPLANT
FACESHIELD WRAPAROUND OR TEAM (MASK) ×1 IMPLANT
GAUZE SPONGE 4X4 12PLY STRL (GAUZE/BANDAGES/DRESSINGS) ×2 IMPLANT
GAUZE XEROFORM 1X8 LF (GAUZE/BANDAGES/DRESSINGS) ×2 IMPLANT
GAUZE XEROFORM 5X9 LF (GAUZE/BANDAGES/DRESSINGS) ×1 IMPLANT
GLOVE BIO SURGEON STRL SZ7.5 (GLOVE) ×3 IMPLANT
GLOVE BIOGEL PI IND STRL 8 (GLOVE) ×1 IMPLANT
GLOVE BIOGEL PI INDICATOR 8 (GLOVE) ×2
GOWN STRL REUS W/ TWL LRG LVL3 (GOWN DISPOSABLE) ×2 IMPLANT
GOWN STRL REUS W/ TWL XL LVL3 (GOWN DISPOSABLE) ×1 IMPLANT
GOWN STRL REUS W/TWL LRG LVL3 (GOWN DISPOSABLE) ×6
GOWN STRL REUS W/TWL XL LVL3 (GOWN DISPOSABLE) ×3
GUIDEWIRE 3.2X400 (WIRE) ×4 IMPLANT
KIT BASIN OR (CUSTOM PROCEDURE TRAY) ×3 IMPLANT
KIT ROOM TURNOVER OR (KITS) ×3 IMPLANT
LINER BOOT UNIVERSAL DISP (MISCELLANEOUS) ×1 IMPLANT
MANIFOLD NEPTUNE II (INSTRUMENTS) ×3 IMPLANT
NAIL TROCH FIX 10X235 LT 130 (Nail) ×2 IMPLANT
NS IRRIG 1000ML POUR BTL (IV SOLUTION) ×3 IMPLANT
PACK GENERAL/GYN (CUSTOM PROCEDURE TRAY) ×3 IMPLANT
PAD ARMBOARD 7.5X6 YLW CONV (MISCELLANEOUS) ×8 IMPLANT
PAD CAST 4YDX4 CTTN HI CHSV (CAST SUPPLIES) ×2 IMPLANT
PADDING CAST COTTON 4X4 STRL (CAST SUPPLIES)
SCREW FEMORAL CFNA 10.35X100MM (Screw) ×2 IMPLANT
SCREW LOCKING 5.0X34MM (Screw) ×2 IMPLANT
STAPLER VISISTAT 35W (STAPLE) ×3 IMPLANT
SUT MON AB 2-0 CT1 36 (SUTURE) ×1 IMPLANT
SUT VIC AB 2-0 CT1 27 (SUTURE) ×3
SUT VIC AB 2-0 CT1 TAPERPNT 27 (SUTURE) IMPLANT
TOWEL OR 17X24 6PK STRL BLUE (TOWEL DISPOSABLE) ×3 IMPLANT
TOWEL OR 17X26 10 PK STRL BLUE (TOWEL DISPOSABLE) ×3 IMPLANT
WATER STERILE IRR 1000ML POUR (IV SOLUTION) ×1 IMPLANT

## 2017-02-17 NOTE — Progress Notes (Signed)
PROGRESS NOTE  Angelica Bell  [id number] DOB: [birth year] DOA: 02/16/2017 PCP: Cindee Salt, MD   Brief Narrative: Angelica Bell is an 83 y.o. female with a history of right hip fracture 2016 who presented with left hip fracture following mechanical fall at home. She was admitted 2/18 and underwent intramedullary implant by Dr. Stann Mainland 2/19. PT and OT evaluations are planned to begin 2/20.   Assessment & Plan: Principal Problem:   Closed displaced intertrochanteric fracture of left femur [facility]) Active Problems:   Chronic pain   Essential hypertension  Left intertrochanteric hip fracture s/p IM implant fixation by Dr. Stann Mainland 2/19:  - DVT ppx per orthopedics: Lovenox 30mg  starting 2/20 AM. - No anemia pre-operatively, will recheck CBC in AM - Advance diet as tolerated - Therapy evaluations ordered  Acute on chronic pain: Verified on [place] CSRS that the patient receives oxycodone 30mg  #90 for 30-day supply as well as fentanyl 42mcg patch. Both last prescribed 1/19.  - Continue oxycodone 30mg  TID prn severe pain  - Monitor for sedation, consider adding back fentanyl patch if needed. - Morphine 2mg  IV q3h prn, wean off as able - Bowel regimen also ordered  Hypertension: Not on medications - Monitor  Hypokalemia - mild - K with fluids  DVT prophylaxis: Lovenox as above Code Status: DNR Family Communication: None at bedside Disposition Plan: Uncertain, therapy evaluations pending.  Consultants:   Orthopedics  Procedures:   02/17/2017: IM implant of left hip   Antimicrobials:  None   Subjective: Patient with severe uncontrolled pain in back, right and left hip. No dyspnea, chest pain, palpitations, or leg swelling/numbness.   Objective: Vitals:   02/17/17 1345 02/17/17 1400 02/17/17 1415 02/17/17 1422  BP: (!) 155/71 138/60 (!) 154/97   Pulse: 93 (!) 56  (!) 102  Resp: 19 20  18   Temp:    98.3 F (36.8 C)  TempSrc:      SpO2: 98% 94% 93% 93%     Intake/Output Summary (Last 24 hours) at 02/17/17 1435 Last data filed at 02/17/17 1341  Gross per 24 hour  Intake             2245 ml  Output             1220 ml  Net             1025 ml   There were no vitals filed for this visit.  Examination: General exam: 83 y.o. female in no distress  Respiratory system: Non-labored breathing on room air. Clear to auscultation bilaterally.  Cardiovascular system: Regular rate and rhythm. No murmur, rub, or gallop. No JVD, and no pedal edema. Gastrointestinal system: Abdomen soft, non-tender, non-distended, with normoactive bowel sounds. No organomegaly or masses felt. Central nervous system: Alert and oriented. No focal neurological deficits. Extremities: Warm. LLE with normal alignment, compartment soft, strength and sensation intact distally. Cap refill brisk throughout.  Skin: Left hip dressing c/d/i Psychiatry: Judgement and insight appear normal. Mood & affect appropriate.   Data Reviewed: I have personally reviewed following labs and imaging studies  CBC:  Recent Labs Lab 02/16/17 1454 02/16/17 1508  WBC 15.0*  --   NEUTROABS 10.6*  --   HGB 12.5 12.9  HCT 37.9 38.0  MCV 85.6  --   PLT 300  --    Basic Metabolic Panel:  Recent Labs Lab 02/16/17 1454 02/16/17 1508  NA 140 141  K 3.2* 3.3*  CL 105 104  CO2 26  --  GLUCOSE 132* 134*  BUN 18 17  CREATININE 0.76 0.80  CALCIUM 9.6  --    GFR: CrCl cannot be calculated (Unknown ideal weight.). Liver Function Tests: No results for input(s): AST, ALT, ALKPHOS, BILITOT, PROT, ALBUMIN in the last 168 hours. No results for input(s): LIPASE, AMYLASE in the last 168 hours. No results for input(s): AMMONIA in the last 168 hours. Coagulation Profile:  Recent Labs Lab 02/16/17 1455  INR 1.02   Cardiac Enzymes: No results for input(s): CKTOTAL, CKMB, CKMBINDEX, TROPONINI in the last 168 hours. BNP (last 3 results) No results for input(s): PROBNP in the last 8760  hours. HbA1C: No results for input(s): HGBA1C in the last 72 hours. CBG: No results for input(s): GLUCAP in the last 168 hours. Lipid Profile: No results for input(s): CHOL, HDL, LDLCALC, TRIG, CHOLHDL, LDLDIRECT in the last 72 hours. Thyroid Function Tests: No results for input(s): TSH, T4TOTAL, FREET4, T3FREE, THYROIDAB in the last 72 hours. Anemia Panel: No results for input(s): VITAMINB12, FOLATE, FERRITIN, TIBC, IRON, RETICCTPCT in the last 72 hours. Urine analysis:    Component Value Date/Time   COLORURINE YELLOW 02/16/2017 [place] 02/16/2017 1851   LABSPEC 1.011 02/16/2017 1851   PHURINE 5.0 02/16/2017 1851   GLUCOSEU NEGATIVE 02/16/2017 1851   HGBUR NEGATIVE 02/16/2017 1851   HGBUR negative 04/24/2009 1635   BILIRUBINUR NEGATIVE 02/16/2017 1851   BILIRUBINUR negative 05/29/2011 1439   KETONESUR NEGATIVE 02/16/2017 1851   PROTEINUR NEGATIVE 02/16/2017 1851   UROBILINOGEN 0.2 01/26/2014 1912   NITRITE NEGATIVE 02/16/2017 1851   LEUKOCYTESUR NEGATIVE 02/16/2017 1851   Recent Results (from the past 240 hour(s))  Surgical pcr screen     Status: Abnormal   Collection Time: 02/16/17  6:56 PM  Result Value Ref Range Status   MRSA, PCR POSITIVE (A) NEGATIVE Final    Comment: CRITICAL RESULT CALLED TO, READ BACK BY AND VERIFIED WITH: Earl Lites, RN, 02/16/17 AT 2042 BY J FUDESCO    Staphylococcus aureus POSITIVE (A) NEGATIVE Final    Comment:        The Xpert SA Assay (FDA approved for NASAL specimens in patients over 92 years of age), is one component of a comprehensive surveillance program.  Test performance has been validated by [facility] for patients greater than or equal to 83 year old. It is not intended to diagnose infection nor to guide or monitor treatment. CRITICAL RESULT CALLED TO, READ BACK BY AND VERIFIED WITH: Earl Lites, RN, 02/13/17 AT 2042 BY J [facility]       Radiology Studies: Dg Chest 2 View  Result Date: 02/16/2017 CLINICAL  DATA:  Pain following fall EXAM: CHEST  2 VIEW COMPARISON:  August 02, 2016 FINDINGS: There is no edema or consolidation. Heart size and pulmonary vascularity are normal. No adenopathy. There is atherosclerotic calcification in the aorta. No pneumothorax. There is an old healed fracture proximal left humerus with remodeling. There is thoracolumbar levoscoliosis. IMPRESSION: No edema or consolidation. Aortic atherosclerosis. Old healed fracture with remodeling left proximal humerus. Marked scoliosis. Electronically Signed   By: Lowella Grip III M.D.   On: 02/16/2017 14:56   Ct Head Wo Contrast  Result Date: 02/16/2017 CLINICAL DATA:  83 year old female with history of trauma after a fall in her kitchen. Injury to the head. No loss of consciousness. EXAM: CT HEAD WITHOUT CONTRAST TECHNIQUE: Contiguous axial images were obtained from the base of the skull through the vertex without intravenous contrast. COMPARISON:  Head CT 04/19/2015. FINDINGS:  Brain: Mild cerebral atrophy. Patchy and confluent areas of decreased attenuation are noted throughout the deep and periventricular white matter of the cerebral hemispheres bilaterally, compatible with chronic microvascular ischemic disease. Well-defined area of low attenuation in the right cerebellar hemisphere, new compared to the prior examination, but most compatible with an area of encephalomalacia from old infarct. No evidence of acute infarction, hemorrhage, hydrocephalus, extra-axial collection or mass lesion/mass effect. Vascular: Cerebrovascular atherosclerosis without acute abnormalities. Skull: Normal. Negative for fracture or focal lesion. Sinuses/Orbits: No acute finding. Other: None. IMPRESSION: 1. No evidence of significant acute traumatic injury to the skull or brain. 2. No acute intracranial abnormalities. 3. Mild cerebral atrophy with chronic microvascular ischemic changes and evidence of remote right cerebellar infarct, as above. 4. Cerebrovascular  atherosclerosis. Electronically Signed   By: Vinnie Langton M.D.   On: 02/16/2017 15:03   Dg C-arm 1-60 Min  Result Date: 02/17/2017 CLINICAL DATA:  Left femur ORIF EXAM: LEFT FEMUR 2 VIEWS; DG C-ARM 61-120 MIN COMPARISON:  Yesterday FINDINGS: Intraoperative fluoroscopy shows placement of a femoral nail with dynamic hip screw. There is anatomic alignment at the patient's intertrochanteric femur fracture. No evidence of intraoperative fracture. IMPRESSION: Fluoroscopy for intertrochanteric femur fracture ORIF. No unexpected finding. Electronically Signed   By: Monte Fantasia M.D.   On: 02/17/2017 14:29   Dg Hip Unilat W Or Wo Pelvis 2-3 Views Left  Result Date: 02/16/2017 CLINICAL DATA:  Pain following fall EXAM: DG HIP (WITH OR WITHOUT PELVIS) 2-3V LEFT COMPARISON:  None. FINDINGS: Frontal pelvis as well as frontal and lateral left hip images were obtained. There is an intertrochanteric femur fracture on the left with medial angulation distally. There is a total hip prosthesis on the right which appears well seated. No dislocation. There is moderate narrowing of the left hip joint. There is lower lumbar levoscoliosis. There is atherosclerotic calcification in the distal aorta and right common iliac artery. IMPRESSION: Intertrochanteric femur fracture on the left with medial angulation distally. No dislocation. Total hip prosthesis on the right, well-seated. Narrowing left hip joint. Aortoiliac atherosclerosis. Lower lumbar levoscoliosis. Electronically Signed   By: Lowella Grip III M.D.   On: 02/16/2017 14:58   Dg Femur Min 2 Views Left  Result Date: 02/17/2017 CLINICAL DATA:  Left femur ORIF EXAM: LEFT FEMUR 2 VIEWS; DG C-ARM 61-120 MIN COMPARISON:  Yesterday FINDINGS: Intraoperative fluoroscopy shows placement of a femoral nail with dynamic hip screw. There is anatomic alignment at the patient's intertrochanteric femur fracture. No evidence of intraoperative fracture. IMPRESSION: Fluoroscopy  for intertrochanteric femur fracture ORIF. No unexpected finding. Electronically Signed   By: Monte Fantasia M.D.   On: 02/17/2017 14:29    Scheduled Meds: . Chlorhexidine Gluconate Cloth  6 each Topical Q0600  . mupirocin ointment  1 application Nasal BID  . senna  1 tablet Oral BID   Continuous Infusions: . dextrose 5 % and 0.45 % NaCl with KCl 20 mEq/L 100 mL/hr at 02/17/17 0908  . lactated ringers 50 mL/hr (02/17/17 1148)     LOS: 1 day   Time spent: 25 minutes.  Vance Gather, MD [facility] Pager [phone]  If 7PM-7AM, please contact night-coverage [email or web address] Password [email or web address] 02/17/2017, 2:35 PM

## 2017-02-17 NOTE — Anesthesia Preprocedure Evaluation (Addendum)
Anesthesia Evaluation  Patient identified by MRN, date of birth, ID band Patient awake    Reviewed: Allergy & Precautions, NPO status , Patient's Chart, lab work & pertinent test results  History of Anesthesia Complications Negative for: history of anesthetic complications  Airway Mallampati: II  TM Distance: >3 FB Neck ROM: Full    Dental  (+) Edentulous Upper, Edentulous Lower   Pulmonary former smoker,    breath sounds clear to auscultation       Cardiovascular hypertension (no meds presently),  Rhythm:Regular Rate:Normal     Neuro/Psych Depression negative neurological ROS     GI/Hepatic negative GI ROS, Neg liver ROS,   Endo/Other  negative endocrine ROS  Renal/GU negative Renal ROS     Musculoskeletal  (+) Arthritis , Osteoarthritis,    Abdominal   Peds  Hematology negative hematology ROS (+)   Anesthesia Other Findings   Reproductive/Obstetrics                            Anesthesia Physical Anesthesia Plan  ASA: III  Anesthesia Plan: General   Post-op Pain Management:    Induction: Intravenous  Airway Management Planned: Oral ETT  Additional Equipment:   Intra-op Plan:   Post-operative Plan: Extubation in OR  Informed Consent: I have reviewed the patients History and Physical, chart, labs and discussed the procedure including the risks, benefits and alternatives for the proposed anesthesia with the patient or authorized representative who has indicated his/her understanding and acceptance.     Plan Discussed with: CRNA and Surgeon  Anesthesia Plan Comments: (Plan routine monitors, GETA)        Anesthesia Quick Evaluation

## 2017-02-17 NOTE — Transfer of Care (Signed)
Immediate Anesthesia Transfer of Care Note  Patient: Angelica Bell  Procedure(s) Performed: Procedure(s): INTRAMEDULLARY (IM) NAIL FEMORAL (Left)  Patient Location: PACU  Anesthesia Type:General  Level of Consciousness: awake and alert   Airway & Oxygen Therapy: Patient Spontanous Breathing and Patient connected to nasal cannula oxygen  Post-op Assessment: Report given to RN, Post -op Vital signs reviewed and stable and Patient moving all extremities X 4  Post vital signs: Reviewed and stable  Last Vitals:  Vitals:   02/17/17 0525 02/17/17 1343  BP: (!) 144/67   Pulse: 91   Resp: 16   Temp: 36.8 C 37.4 C    Last Pain:  Vitals:   02/17/17 0645  TempSrc:   PainSc: Asleep         Complications: No apparent anesthesia complications

## 2017-02-17 NOTE — Progress Notes (Signed)
Dr. Jenita Seashore at bedside, she said no need for blood draw for type and cross, also patient is back to baseline pre-op which answers to name only.

## 2017-02-17 NOTE — Anesthesia Postprocedure Evaluation (Addendum)
Anesthesia Post Note  Patient: Angelica Bell  Procedure(s) Performed: Procedure(s) (LRB): INTRAMEDULLARY (IM) NAIL FEMORAL (Left)  Patient location during evaluation: PACU Anesthesia Type: General Level of consciousness: awake and alert and patient cooperative (mental status unchanged from pre-op) Pain management: pain level controlled Vital Signs Assessment: post-procedure vital signs reviewed and stable Respiratory status: nonlabored ventilation, respiratory function stable, spontaneous breathing and patient connected to nasal cannula oxygen Cardiovascular status: stable and blood pressure returned to baseline Postop Assessment: no signs of nausea or vomiting Anesthetic complications: no       Last Vitals:  Vitals:   02/17/17 1422 02/17/17 1443  BP:  (!) 132/92  Pulse: (!) 102 94  Resp: 18 18  Temp: 36.8 C 37.2 C    Last Pain:  Vitals:   02/17/17 1443  TempSrc: Oral  PainSc:                  Gladis Soley,E. Eesha Schmaltz

## 2017-02-17 NOTE — Anesthesia Procedure Notes (Signed)
Procedure Name: Intubation Date/Time: 02/17/2017 12:32 PM Performed by: Oletta Lamas Pre-anesthesia Checklist: Patient identified, Emergency Drugs available, Suction available and Patient being monitored Patient Re-evaluated:Patient Re-evaluated prior to inductionOxygen Delivery Method: Circle System Utilized Preoxygenation: Pre-oxygenation with 100% oxygen Intubation Type: IV induction Ventilation: Mask ventilation without difficulty Laryngoscope Size: Mac and 3 Grade View: Grade I Tube type: Oral Tube size: 7.5 mm Number of attempts: 1 Airway Equipment and Method: Stylet Placement Confirmation: ETT inserted through vocal cords under direct vision,  positive ETCO2 and breath sounds checked- equal and bilateral Secured at: 21 cm Tube secured with: Tape Dental Injury: Teeth and Oropharynx as per pre-operative assessment

## 2017-02-17 NOTE — Anesthesia Procedure Notes (Signed)
Performed by: Dillon Mcreynolds B       

## 2017-02-17 NOTE — Op Note (Signed)
Date of Surgery: 02/17/2017  INDICATIONS: Angelica Bell is a 83 y.o.-year-old female who sustained a left hip fracture. The risks and benefits of the procedure discussed with the patient and family prior to the procedure and all questions were answered; consent was obtained.  PREOPERATIVE DIAGNOSIS: left hip fracture   POSTOPERATIVE DIAGNOSIS: Same   PROCEDURE: Treatment of intertrochanteric, pertrochanteric, subtrochanteric fracture with intramedullary implant. CPT [phone]   SURGEON: Dannielle Karvonen. Stann Mainland, M.D.   ANESTHESIA: general   IV FLUIDS AND URINE: See anesthesia record   ESTIMATED BLOOD LOSS: 50 cc  IMPLANTS:  Synthes TFN A 10 mm diameter 100 mm compression screw in head 5.0 mm distal interlock screw x 1  DRAINS: None.   COMPLICATIONS: None.   DESCRIPTION OF PROCEDURE: The patient was brought to the operating room and placed supine on the operating table. The patient's leg had been signed prior to the procedure. The patient had the anesthesia placed by the anesthesiologist. The prep verification and incision time-outs were performed to confirm that this was the correct patient, site, side and location. The patient had an SCD on the opposite lower extremity. The patient did receive antibiotics prior to the incision and was re-dosed during the procedure as needed at indicated intervals. The patient was positioned on the fracture table with the table in traction and internal rotation to reduce the hip. The well leg was placed in a scissor position and all bony prominences were well-padded. The patient had the lower extremity prepped and draped in the standard surgical fashion. The incision was made 4 finger breadths superior to the greater trochanter. A guide pin was inserted into the tip of the greater trochanter under fluoroscopic guidance. An opening reamer was used to gain access to the femoral canal. The nail length was measured and inserted down the femoral canal to its proper depth.  The appropriate version of insertion for the lag screw was found under fluoroscopy. A pin was inserted up the femoral neck through the jig. The length of the lag screw was then measured. The lag screw was inserted as near to center-center in the head as possible. The leg was taken out of traction, then the interdigitating compression screw was used to compress across the fracture. Compression was visualized on serial xrays. Lastly we placed a distal interlock screw through the jig for the nail. This was accomplished by making a percutaneous stab incision at the level of the screw site spread down with a tonsil drilled across both cortices and then measured the depth and inserted a screw by hand. The wound was copiously irrigated with saline and the subcutaneous layer closed with 2.0 vicryl and the skin was reapproximated with staples. The wounds were cleaned and dried a final time and a sterile dressing was placed. The hip was taken through a range of motion at the end of the case under fluoroscopic imaging to visualize the approach-withdraw phenomenon and confirm implant length in the head. The patient was then awakened from anesthesia and taken to the recovery room in stable condition. All counts were correct at the end of the case.   POSTOPERATIVE PLAN: Angelica Bell will be weight bearing as tolerated and will return in 2 weeks for staple removal and the patient will receive DVT prophylaxis based on other medications, activity level, and risk ratio of bleeding to thrombosis.  We will recommend daily aspirin for one month post operatively for her.   Angelica Bell, [place] [phone] 1:44 PM

## 2017-02-17 NOTE — H&P (Signed)
H&P update  The surgical history has been reviewed and remains accurate without interval change.  The patient was re-examined and patient's physiologic condition has not changed significantly in the last 30 days. The condition still exists that makes this procedure necessary. The treatment plan remains the same, without new options for care.  No new pharmacological allergies or types of therapy has been initiated that would change the plan or the appropriateness of the plan.  The patient and/or family understand the potential benefits and risks.  Jason P. Stann Mainland, MD 02/17/2017 12:15 PM

## 2017-02-18 ENCOUNTER — Encounter (HOSPITAL_COMMUNITY): Payer: Self-pay | Admitting: Orthopedic Surgery

## 2017-02-18 LAB — CBC
HEMATOCRIT: 27.9 % — AB (ref 36.0–46.0)
HEMOGLOBIN: 9.2 g/dL — AB (ref 12.0–15.0)
MCH: 28.5 pg (ref 26.0–34.0)
MCHC: 33 g/dL (ref 30.0–36.0)
MCV: 86.4 fL (ref 78.0–100.0)
Platelets: 197 10*3/uL (ref 150–400)
RBC: 3.23 MIL/uL — ABNORMAL LOW (ref 3.87–5.11)
RDW: 13.7 % (ref 11.5–15.5)
WBC: 16 10*3/uL — ABNORMAL HIGH (ref 4.0–10.5)

## 2017-02-18 LAB — BASIC METABOLIC PANEL
Anion gap: 8 (ref 5–15)
BUN: 19 mg/dL (ref 6–20)
CO2: 28 mmol/L (ref 22–32)
CREATININE: 0.93 mg/dL (ref 0.44–1.00)
Calcium: 9 mg/dL (ref 8.9–10.3)
Chloride: 100 mmol/L — ABNORMAL LOW (ref 101–111)
GFR calc Af Amer: >60 mL/min (ref >60)
GFR calc non Af Amer: 56 mL/min — ABNORMAL LOW (ref >60)
GLUCOSE: 147 mg/dL — AB (ref 65–99)
Potassium: 4.4 mmol/L (ref 3.5–5.1)
Sodium: 136 mmol/L (ref 135–145)

## 2017-02-18 LAB — TYPE AND SCREEN
ABO/RH(D): A NEG
Antibody Screen: NEGATIVE

## 2017-02-18 NOTE — Evaluation (Signed)
Occupational Therapy Evaluation Patient Details Name: Angelica Bell MRN: [id number] DOB: [birth year] Today's Date: 02/18/2017    History of Present Illness S/P L hip fracture (closed displaced fracture of left femur s/p mechanical fall at home). DOS: 02/17/17   Clinical Impression   Pt admitted as above currently demonstrates deficits in her ability to perform ADL/self care tasks (see OT problem list below). Pt is pleasantly confused and no family was present to assist with baseline level of function and home set up. Anitcipate that pt will need SNF Rehab & 24/7 assist following acute stay. Will follow.    Follow Up Recommendations  SNF;Supervision/Assistance - 24 hour    Equipment Recommendations  Other (comment) (To be determined at next venue)    Recommendations for Other Services       Precautions / Restrictions Precautions Precautions: Fall;Other (comment) Precaution Comments: Contact  Restrictions Weight Bearing Restrictions: Yes LLE Weight Bearing: Weight bearing as tolerated      Mobility Bed Mobility Overal bed mobility: Needs Assistance Bed Mobility: Supine to Sit     Supine to sit: Min assist;HOB elevated     General bed mobility comments: Min A LLE to get foot off of bed  Transfers Overall transfer level: Needs assistance Equipment used: Rolling walker (2 wheeled) Transfers: Sit to/from [facility] Sit to Stand: Min assist Stand pivot transfers: Min assist       General transfer comment: Overall decreased safety awareness and awareness of deficits, somewhat impulsive and confused all of which may be related to medications. No family present during assessment to assist with baseline.    Balance Overall balance assessment: History of Falls;Needs assistance Sitting-balance support: No upper extremity supported;Feet supported Sitting balance-Leahy Scale: Good     Standing balance support: Bilateral upper extremity supported;During  functional activity Standing balance-Leahy Scale: Fair Standing balance comment: H/O Falls. Decreased safety awareness, requires physical assistance with RW for safety/impulsive. May be medication related                            ADL Overall ADL's : Needs assistance/impaired     Grooming: Wash/dry hands;Wash/dry face;Set up;Sitting   Upper Body Bathing: Set up;Minimal assistance;Sitting   Lower Body Bathing: Set up;Moderate assistance;Sit to/from stand   Upper Body Dressing : Set up;Minimal assistance;Sitting   Lower Body Dressing: Maximal assistance;Sit to/from stand   Toilet Transfer: Minimal assistance;Stand-pivot;Ambulation;RW;BSC;Cueing for safety   Toileting- Clothing Manipulation and Hygiene: Minimal assistance;Sit to/from stand;Cueing for safety       Functional mobility during ADLs: Minimal assistance;Cueing for safety;Cueing for sequencing;Rolling walker General ADL Comments: Pt admitted s/p L hip fracture. Pt is pleasantly confused and no family was present to assist with baseline level of function and home set up. Anitcipate that pt will need SNF Rehab following acute stay. Pt participated in ADL retraining session to include functional mobility in room and simulated transfer to 3:1 (Ambulated from EOB to couch/window and back to recliner). Pt was also educated in Role of OT and performed grooming tasks seated in recliner.      Vision   Vision Assessment?: No apparent visual deficits     Perception     Praxis      Pertinent Vitals/Pain Pain Assessment: 0-10 Pain Score: 10-Worst pain ever Pain Location: Left hip Pain Descriptors / Indicators: Sore Pain Intervention(s): Limited activity within patient's tolerance;Monitored during session;Premedicated before session;Repositioned;Ice applied (RN gave IV Morphine prior to session)  Hand Dominance     Extremity/Trunk Assessment Upper Extremity Assessment Upper Extremity Assessment: Overall WFL  for tasks assessed;Generalized weakness   Lower Extremity Assessment Lower Extremity Assessment: Defer to PT evaluation       Communication Communication Communication: HOH   Cognition Arousal/Alertness: Awake/alert Behavior During Therapy: Impulsive;WFL for tasks assessed/performed Overall Cognitive Status: No family/caregiver present to determine baseline cognitive functioning (Pt demonstrates some confusion, no family present to assist in determining baseline. ?Could be Medication related?) Area of Impairment: Attention;Memory;Safety/judgement;Problem solving;Awareness;Following commands     Memory:  (Decreased safety awareness) Following Commands: Follows one step commands inconsistently (???Medication related) Safety/Judgement: Decreased awareness of safety   Problem Solving: Requires verbal cues;Requires tactile cues;Difficulty sequencing     General Comments       Exercises       Shoulder Instructions      Home Living Family/patient expects to be discharged to:: Skilled nursing facility Living Arrangements: Alone   Type of Home: House Home Access: Stairs to enter Entrance Stairs-Number of Steps: 1 Entrance Stairs-Rails: None Home Layout: One level (Pt states that daughter lives 15 miles away and "She checks on me" Note: Pt is confused during assessment, no family present to confirm living arrangements)                          Prior Functioning/Environment                   OT Problem List: Decreased strength;Impaired balance (sitting and/or standing);Decreased cognition;Decreased knowledge of precautions;Pain;Decreased safety awareness;Decreased knowledge of use of DME or AE      OT Treatment/Interventions: Self-care/ADL training;DME and/or AE instruction;Therapeutic activities;Therapeutic exercise;Patient/family education    OT Goals(Current goals can be found in the care plan section) Acute Rehab OT Goals Patient Stated Goal: Go to the  same rehab that she went to before Time For Goal Achievement: 02/25/17 Potential to Achieve Goals: Good  OT Frequency: Min 2X/week   Barriers to D/C:            Co-evaluation PT/OT/SLP Co-Evaluation/Treatment: Yes Reason for Co-Treatment: For patient/therapist safety;To address functional/ADL transfers   OT goals addressed during session: ADL's and self-care      End of Session Equipment Utilized During Treatment: Gait belt;Rolling walker Nurse Communication: Mobility status;Other (comment) (Basic grooming seated; confused)  Activity Tolerance: Patient tolerated treatment well Patient left: in chair;with call bell/phone within reach;with chair alarm set;with nursing/sitter in room  OT Visit Diagnosis: Unsteadiness on feet (R26.81);Other abnormalities of gait and mobility (R26.89);Pain;History of falling (Z91.81) Pain - Right/Left: Left Pain - part of body: Hip;Leg                ADL either performed or assessed with clinical judgement  Time: 0842-0910 OT Time Calculation (min): 28 min Charges:  OT Evaluation $OT Eval Low Complexity: 1 Procedure OT Treatments $Self Care/Home Management : 8-22 mins G-Codes:       Josephine Igo Dixon, OTR/L 02/18/2017, 9:45 AM

## 2017-02-18 NOTE — NC FL2 (Signed)
[place] MEDICAID FL2 LEVEL OF CARE SCREENING TOOL     IDENTIFICATION  Patient Name: Angelica Bell Birthdate: December 30, 1932 Sex: female Admission Date (Current Location): 02/16/2017  [facility] and [place] Number:  Herbalist and Address:  [facility], [place] [address] [place],  [address]      Provider Number: [id number]  Attending Physician Name and Address:  Patrecia Pour, MD  Relative Name and Phone Number:       Current Level of Care: Hospital Recommended Level of Care: [place] Prior Approval Number:    Date Approved/Denied:   PASRR Number: [id number]  Discharge Plan: SNF    Current Diagnoses: Patient Active Problem List   Diagnosis Date Noted  . Closed displaced intertrochanteric fracture of left femur ([place]) 02/16/2017  . Hip fracture, right ([place]) 12/25/2015  . Chronic pain 12/24/2015  . Fracture of femoral neck, right, closed ([place]) 12/24/2015  . Essential hypertension 12/24/2015  . Community acquired pneumonia 04/15/2013  . Hypokalemia 04/15/2013  . Labial cyst 02/08/2012  . POSTMENOPAUSAL SYNDROME 11/28/2009  . PERIPHERAL NEUROPATHY, LOWER EXTREMITY, RIGHT 02/22/2009  . INSOMNIA-SLEEP DISORDER-UNSPEC 10/12/2008  . Lumbago 08/02/2008  . 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 ANXIETY DEPRESSION 12/16/2007  . HYPERLIPIDEMIA 07/08/2007  . Osteoarthritis 07/08/2007  . Osteoporosis 07/08/2007  . SCOLIOSIS NEC 07/08/2007    Orientation RESPIRATION BLADDER Height & Weight     Self  O2 (Nasal Cannula, 2L) Continent Weight:   Height:     BEHAVIORAL SYMPTOMS/MOOD NEUROLOGICAL BOWEL NUTRITION STATUS      Continent  (Please see discharge summary)  AMBULATORY STATUS COMMUNICATION OF NEEDS Skin   Limited Assist Verbally Surgical wounds (Closed incision left hip, mepilex dressing. Closed incision right hip)                       Personal Care Assistance Level of Assistance  Bathing,  Feeding, Dressing Bathing Assistance: Limited assistance Feeding assistance: Independent Dressing Assistance: Limited assistance     Functional Limitations Info  Sight, Hearing, Speech Sight Info: Adequate Hearing Info: Adequate Speech Info: Adequate    SPECIAL CARE FACTORS FREQUENCY  PT (By licensed PT), OT (By licensed OT)     PT Frequency: 2x week OT Frequency: 2x week            Contractures Contractures Info: Not present    Additional Factors Info  Code Status, Allergies, Isolation Precautions Code Status Info: DNR Allergies Info: Amoxicillin-pot Clavulanate, Lisinopril-hydrochlorothiazide, Sulfonamide Derivatives     Isolation Precautions Info: Contact precaution; MRSA     Current Medications (02/18/2017):  This is the current hospital active medication list Current Facility-Administered Medications  Medication Dose Route Frequency Provider Last Rate Last Dose  . acetaminophen (TYLENOL) tablet 650 mg  650 mg Oral Q6H PRN Nicholes Stairs, MD   650 mg at 02/18/17 1059   Or  . acetaminophen (TYLENOL) suppository 650 mg  650 mg Rectal Q6H PRN Nicholes Stairs, MD      . Chlorhexidine Gluconate Cloth 2 % PADS 6 each  6 each Topical Q0600 Nicholes Stairs, MD   6 each at 02/18/17 [phone]  . clindamycin (CLEOCIN) IVPB 600 mg  600 mg Intravenous Q6H Nicholes Stairs, MD   600 mg at 02/18/17 0606  . dextrose 5 % and 0.45 % NaCl with KCl 20 mEq/L infusion   Intravenous Continuous Gwynne Edinger, MD 100 mL/hr at 02/17/17 0908    . docusate  sodium (COLACE) capsule 100 mg  100 mg Oral BID Nicholes Stairs, MD   100 mg at 02/18/17 [id number]  . enoxaparin (LOVENOX) injection 30 mg  30 mg Subcutaneous Q24H Nicholes Stairs, MD   30 mg at 02/18/17 0835  . lactated ringers infusion   Intravenous Continuous Suzette Battiest, MD 50 mL/hr at 02/17/17 1148    . methocarbamol (ROBAXIN) tablet 500 mg  500 mg Oral Q6H PRN Nicholes Stairs, MD   500 mg at 02/18/17  1059  . metoCLOPramide (REGLAN) tablet 5-10 mg  5-10 mg Oral Q8H PRN Nicholes Stairs, MD       Or  . metoCLOPramide [facility]) injection 5-10 mg  5-10 mg Intravenous Q8H PRN Nicholes Stairs, MD      . morphine 2 MG/ML injection 2 mg  2 mg Intravenous Q2H PRN Nicholes Stairs, MD   2 mg at 02/18/17 0848  . mupirocin ointment (BACTROBAN) 2 % 1 application  1 application Nasal BID Nicholes Stairs, MD   1 application at [id number] [phone]  . ondansetron (ZOFRAN) tablet 4 mg  4 mg Oral Q6H PRN Nicholes Stairs, MD       Or  . ondansetron [facility]) injection 4 mg  4 mg Intravenous Q6H PRN Nicholes Stairs, MD      . oxyCODONE (Oxy IR/ROXICODONE) immediate release tablet 30 mg  30 mg Oral TID PRN Patrecia Pour, MD   30 mg at 02/18/17 0207  . senna (SENOKOT) tablet 8.6 mg  1 tablet Oral BID Gwynne Edinger, MD   8.6 mg at 02/18/17 [id number]     Discharge Medications: Please see discharge summary for a list of discharge medications.  Relevant Imaging Results:  Relevant Lab Results:   Additional Information [id number]  Alla German, LCSW

## 2017-02-18 NOTE — Progress Notes (Signed)
   Subjective:  Patient reports pain as mild.  Doing well, up with therapy today  Objective:   VITALS:   Vitals:   02/17/17 1422 02/17/17 1443 02/17/17 2021 02/18/17 2032  BP:  (!) 132/92 101/64 114/68  Pulse: (!) 102 94 80 95  Resp: 18 18 15 18   Temp: 98.3 F (36.8 C) 98.9 F (37.2 C) 98.3 F (36.8 C) 97.2 F (36.2 C)  TempSrc:  Oral  Oral  SpO2: 93% 94% 90% 97%    Neurologically intact Neurovascular intact Sensation intact distally Intact pulses distally Dorsiflexion/Plantar flexion intact Incision: dressing C/D/I   Lab Results  Component Value Date   WBC 16.0 (H) 02/18/2017   HGB 9.2 (L) 02/18/2017   HCT 27.9 (L) 02/18/2017   MCV 86.4 02/18/2017   PLT 197 02/18/2017   BMET    Component Value Date/Time   NA 136 02/18/2017 0526   K 4.4 02/18/2017 0526   CL 100 (L) 02/18/2017 0526   CO2 28 02/18/2017 0526   GLUCOSE 147 (H) 02/18/2017 0526   BUN 19 02/18/2017 0526   CREATININE 0.93 02/18/2017 0526   CALCIUM 9.0 02/18/2017 0526   GFRNONAA 56 (L) 02/18/2017 0526   GFRAA >60 02/18/2017 0526     Assessment/Plan: 1 Day Post-Op   Principal Problem:   Closed displaced intertrochanteric fracture of left femur (HCC) Active Problems:   Hypokalemia   Chronic pain   Essential hypertension   Up with therapy WBAT LLE Maintain dressing until follow up lovenox in house, dc on bid asa for DVT ppx Return to see MD in 2 weeks   Nicholes Stairs 02/18/2017, 10:43 PM   Geralynn Rile, MD [phone]

## 2017-02-18 NOTE — Clinical Social Work Note (Signed)
Clinical Social Work Assessment  Patient Details  Name: Angelica Bell MRN: [id number] Date of Birth: [birth year]  Date of referral:  02/18/17               Reason for consult:  Facility Placement                Permission sought to share information with:  Family Supports Permission granted to share information::     Name::     [name]  Agency::     Relationship::  Grandson  Contact Information:  [phone]  Housing/Transportation Living arrangements for the past 2 months:  [place] of Information:  Other (Comment Required) (Grandson, primary caretaker) Patient Interpreter Needed:  None Criminal Activity/Legal Involvement Pertinent to Current Situation/Hospitalization:  No - Comment as needed Significant Relationships:  Other Family Members (Grandson) Lives with:  Facility Resident Do you feel safe going back to the place where you live?  Yes Need for family participation in patient care:     Care giving concerns:  CSW spoke with pt's grandson via telephone. Pt's grandson is pt's primary caretakers.    Social Worker assessment / plan:  Pt is only alert to self. CSW spoke with pt's grandson via telephone. Pt was at [facility] for short term rehab prior to admission and pt's grandson wants pt to return at d/c. CSW will follow up with facility and initiate insurance authorization.   Employment status:  Retired Nurse, adult PT Recommendations:  [place] / Referral to community resources:  [place]  Patient/Family's Response to care:  Pt's grandson verbalized understanding of CSW role and expressed appreciation for support. Pt's grandson denies any concern regarding pt care at this time.   Patient/Family's Understanding of and Emotional Response to Diagnosis, Current Treatment, and Prognosis:  Pt's grandson was understanding and realistic regarding physical limitations. Pt's grandson  understands the need for SNF placement at d/c. Pt's grandson agreeable for pt to return to [place] at d/c. Pt's grandson denies any concern regarding treatment plan at this time. CSW will continue to provide support and facilitate d/c needs.   Emotional Assessment Appearance:  Appears stated age Attitude/Demeanor/Rapport:  Unable to Assess Affect (typically observed):  Unable to Assess Orientation:  Oriented to Self Alcohol / Substance use:  Not Applicable Psych involvement (Current and /or in the community):  No (Comment)  Discharge Needs  Concerns to be addressed:  No discharge needs identified Readmission within the last 30 days:  No Current discharge risk:  Dependent with Mobility Barriers to Discharge:  Continued Medical Work up   [employer], LCSW 02/18/2017, 11:06 AM

## 2017-02-18 NOTE — Progress Notes (Signed)
PROGRESS NOTE  Angelica Bell  [id number] DOB: 1932/09/30 DOA: 02/16/2017 PCP: Cindee Salt, MD   Brief Narrative: Angelica Bell is an 83 y.o. female with a history of right hip fracture 2016 who presented with left hip fracture following mechanical fall at home. She was admitted 2/18 and underwent intramedullary implant by Dr. Stann Mainland 2/19. Physical and occupational therapists have recommended SNF disposition at discharge. SW has been consulted and planning to send patient to [facility] 02/19/2017.   Assessment & Plan: Principal Problem:   Closed displaced intertrochanteric fracture of left femur [facility]) Active Problems:   Hypokalemia   Chronic pain   Essential hypertension  Left intertrochanteric hip fracture s/p IM nail by Dr. Stann Mainland 2/19:  - DVT ppx per orthopedics: Lovenox 30mg  starting 2/20 AM. - Post-op anemia, down to 9.2. Will monitor and transfuse as indicated. - Advance diet as tolerated  Acute on chronic pain: Verified on [place] CSRS that the patient receives oxycodone 30mg  #90 for 30-day supply as well as fentanyl 32mcg patch. Both last prescribed 1/19, though patient reports fentanyl patch has been stopped by Dr. Nelva Bush.  - Continue oxycodone 30mg  TID prn severe pain  - Morphine 2mg  IV q3h prn, wean off as able - Bowel regimen also ordered  Hypertension: Not on medications - Monitor  Hypokalemia: Mild, corrected with K in IVF.  - DC IVF as po is at baseline.  Leukocytosis: Presumed reactive to procedure, not infection.  - Monitor  MRSA swab +:  - Contact precautions per protocol  DVT prophylaxis: Lovenox as above Code Status: DNR Family Communication: None at bedside Disposition Plan: SNF 2/21  Consultants:   Orthopedics  Procedures:   02/17/2017: IM implant of left hip   Antimicrobials:  None   Subjective: Patient with improved pain since adding back home oxycodone. Right hip, back and neck pain all at their chronic baseline. No dyspnea, chest  pain, palpitations, or leg swelling/numbness.   Objective: Vitals:   02/17/17 1415 02/17/17 1422 02/17/17 1443 02/17/17 2021  BP: (!) 154/97  (!) 132/92 101/64  Pulse:  (!) 102 94 80  Resp:  18 18 15   Temp:  98.3 F (36.8 C) 98.9 F (37.2 C) 98.3 F (36.8 C)  TempSrc:   Oral   SpO2: 93% 93% 94% 90%    Intake/Output Summary (Last 24 hours) at 02/18/17 [id number] Last data filed at 02/18/17 0550  Gross per 24 hour  Intake             1640 ml  Output              720 ml  Net              920 ml    Examination: General exam: 83 y.o. female in no distress  Respiratory system: Non-labored breathing on room air. Clear to auscultation bilaterally.  Cardiovascular system: Regular rate and rhythm. No murmur, rub, or gallop. No JVD, and no pedal edema. Gastrointestinal system: Abdomen soft, non-tender, non-distended, with normoactive bowel sounds. No organomegaly or masses felt. Central nervous system: Alert and oriented. No focal neurological deficits. Extremities: Warm. LLE with normal alignment, compartment soft, strength and sensation intact distally. Cap refill brisk throughout.  Skin: Left hip dressing c/d/i. Scattered ecchymoses on left lower extremity. Psychiatry: Judgement and insight appear normal. Mood & affect appropriate.   Data Reviewed: I have personally reviewed following labs and imaging studies  CBC:  Recent Labs Lab 02/16/17 1454 02/16/17 1508 02/17/17 1631 02/18/17 [id number]  WBC 15.0*  --  13.1* 16.0*  NEUTROABS 10.6*  --   --   --   HGB 12.5 12.9 10.1* 9.2*  HCT 37.9 38.0 31.2* 27.9*  MCV 85.6  --  86.7 86.4  PLT 300  --  200 [id number]   Basic Metabolic Panel:  Recent Labs Lab 02/16/17 1454 02/16/17 1508 02/17/17 1631 02/18/17 0526  NA 140 141  --  136  K 3.2* 3.3*  --  4.4  CL 105 104  --  100*  CO2 26  --   --  28  GLUCOSE 132* 134*  --  147*  BUN 18 17  --  19  CREATININE 0.76 0.80 0.67 0.93  CALCIUM 9.6  --   --  9.0   GFR: CrCl cannot be calculated  (Unknown ideal weight.). Liver Function Tests: No results for input(s): AST, ALT, ALKPHOS, BILITOT, PROT, ALBUMIN in the last 168 hours. No results for input(s): LIPASE, AMYLASE in the last 168 hours. No results for input(s): AMMONIA in the last 168 hours. Coagulation Profile:  Recent Labs Lab 02/16/17 1455  INR 1.02   Cardiac Enzymes: No results for input(s): CKTOTAL, CKMB, CKMBINDEX, TROPONINI in the last 168 hours. BNP (last 3 results) No results for input(s): PROBNP in the last 8760 hours. HbA1C: No results for input(s): HGBA1C in the last 72 hours. CBG: No results for input(s): GLUCAP in the last 168 hours. Lipid Profile: No results for input(s): CHOL, HDL, LDLCALC, TRIG, CHOLHDL, LDLDIRECT in the last 72 hours. Thyroid Function Tests: No results for input(s): TSH, T4TOTAL, FREET4, T3FREE, THYROIDAB in the last 72 hours. Anemia Panel: No results for input(s): VITAMINB12, FOLATE, FERRITIN, TIBC, IRON, RETICCTPCT in the last 72 hours. Urine analysis:    Component Value Date/Time   COLORURINE YELLOW 02/16/2017 [place] 02/16/2017 1851   LABSPEC 1.011 02/16/2017 1851   PHURINE 5.0 02/16/2017 1851   GLUCOSEU NEGATIVE 02/16/2017 1851   HGBUR NEGATIVE 02/16/2017 1851   HGBUR negative 04/24/2009 1635   BILIRUBINUR NEGATIVE 02/16/2017 1851   BILIRUBINUR negative 05/29/2011 1439   KETONESUR NEGATIVE 02/16/2017 1851   PROTEINUR NEGATIVE 02/16/2017 1851   UROBILINOGEN 0.2 01/26/2014 1912   NITRITE NEGATIVE 02/16/2017 1851   LEUKOCYTESUR NEGATIVE 02/16/2017 1851   Recent Results (from the past 240 hour(s))  Surgical pcr screen     Status: Abnormal   Collection Time: 02/16/17  6:56 PM  Result Value Ref Range Status   MRSA, PCR POSITIVE (A) NEGATIVE Final    Comment: CRITICAL RESULT CALLED TO, READ BACK BY AND VERIFIED WITH: Earl Lites, RN, 02/16/17 AT 2042 BY J FUDESCO    Staphylococcus aureus POSITIVE (A) NEGATIVE Final    Comment:        The Xpert SA  Assay (FDA approved for NASAL specimens in patients over 61 years of age), is one component of a comprehensive surveillance program.  Test performance has been validated by [facility] for patients greater than or equal to 26 year old. It is not intended to diagnose infection nor to guide or monitor treatment. CRITICAL RESULT CALLED TO, READ BACK BY AND VERIFIED WITH: Earl Lites, RN, 02/13/17 AT 2042 BY J [facility]       Radiology Studies: Dg Chest 2 View  Result Date: 02/16/2017 CLINICAL DATA:  Pain following fall EXAM: CHEST  2 VIEW COMPARISON:  August 02, 2016 FINDINGS: There is no edema or consolidation. Heart size and pulmonary vascularity are normal. No adenopathy. There is atherosclerotic calcification in  the aorta. No pneumothorax. There is an old healed fracture proximal left humerus with remodeling. There is thoracolumbar levoscoliosis. IMPRESSION: No edema or consolidation. Aortic atherosclerosis. Old healed fracture with remodeling left proximal humerus. Marked scoliosis. Electronically Signed   By: Lowella Grip III M.D.   On: 02/16/2017 14:56   Ct Head Wo Contrast  Result Date: 02/16/2017 CLINICAL DATA:  83 year old female with history of trauma after a fall in her kitchen. Injury to the head. No loss of consciousness. EXAM: CT HEAD WITHOUT CONTRAST TECHNIQUE: Contiguous axial images were obtained from the base of the skull through the vertex without intravenous contrast. COMPARISON:  Head CT 04/19/2015. FINDINGS: Brain: Mild cerebral atrophy. Patchy and confluent areas of decreased attenuation are noted throughout the deep and periventricular white matter of the cerebral hemispheres bilaterally, compatible with chronic microvascular ischemic disease. Well-defined area of low attenuation in the right cerebellar hemisphere, new compared to the prior examination, but most compatible with an area of encephalomalacia from old infarct. No evidence of acute infarction, hemorrhage,  hydrocephalus, extra-axial collection or mass lesion/mass effect. Vascular: Cerebrovascular atherosclerosis without acute abnormalities. Skull: Normal. Negative for fracture or focal lesion. Sinuses/Orbits: No acute finding. Other: None. IMPRESSION: 1. No evidence of significant acute traumatic injury to the skull or brain. 2. No acute intracranial abnormalities. 3. Mild cerebral atrophy with chronic microvascular ischemic changes and evidence of remote right cerebellar infarct, as above. 4. Cerebrovascular atherosclerosis. Electronically Signed   By: Vinnie Langton M.D.   On: 02/16/2017 15:03   Dg C-arm 1-60 Min  Result Date: 02/17/2017 CLINICAL DATA:  Left femur ORIF EXAM: LEFT FEMUR 2 VIEWS; DG C-ARM 61-120 MIN COMPARISON:  Yesterday FINDINGS: Intraoperative fluoroscopy shows placement of a femoral nail with dynamic hip screw. There is anatomic alignment at the patient's intertrochanteric femur fracture. No evidence of intraoperative fracture. IMPRESSION: Fluoroscopy for intertrochanteric femur fracture ORIF. No unexpected finding. Electronically Signed   By: Monte Fantasia M.D.   On: 02/17/2017 14:29   Dg Hip Unilat W Or Wo Pelvis 2-3 Views Left  Result Date: 02/16/2017 CLINICAL DATA:  Pain following fall EXAM: DG HIP (WITH OR WITHOUT PELVIS) 2-3V LEFT COMPARISON:  None. FINDINGS: Frontal pelvis as well as frontal and lateral left hip images were obtained. There is an intertrochanteric femur fracture on the left with medial angulation distally. There is a total hip prosthesis on the right which appears well seated. No dislocation. There is moderate narrowing of the left hip joint. There is lower lumbar levoscoliosis. There is atherosclerotic calcification in the distal aorta and right common iliac artery. IMPRESSION: Intertrochanteric femur fracture on the left with medial angulation distally. No dislocation. Total hip prosthesis on the right, well-seated. Narrowing left hip joint. Aortoiliac  atherosclerosis. Lower lumbar levoscoliosis. Electronically Signed   By: Lowella Grip III M.D.   On: 02/16/2017 14:58   Dg Femur Min 2 Views Left  Result Date: 02/17/2017 CLINICAL DATA:  Left femur ORIF EXAM: LEFT FEMUR 2 VIEWS; DG C-ARM 61-120 MIN COMPARISON:  Yesterday FINDINGS: Intraoperative fluoroscopy shows placement of a femoral nail with dynamic hip screw. There is anatomic alignment at the patient's intertrochanteric femur fracture. No evidence of intraoperative fracture. IMPRESSION: Fluoroscopy for intertrochanteric femur fracture ORIF. No unexpected finding. Electronically Signed   By: Monte Fantasia M.D.   On: 02/17/2017 14:29    Scheduled Meds: . Chlorhexidine Gluconate Cloth  6 each Topical Q0600  . clindamycin (CLEOCIN) IV  600 mg Intravenous Q6H  . docusate sodium  100 mg  Oral BID  . enoxaparin (LOVENOX) injection  30 mg Subcutaneous Q24H  . mupirocin ointment  1 application Nasal BID  . senna  1 tablet Oral BID   Continuous Infusions: . dextrose 5 % and 0.45 % NaCl with KCl 20 mEq/L 100 mL/hr at 02/17/17 0908  . lactated ringers 50 mL/hr (02/17/17 1148)     LOS: 2 days   Time spent: 25 minutes.  Vance Gather, MD [facility] Pager [phone]  If 7PM-7AM, please contact night-coverage [email or web address] Password [email or web address] 02/18/2017, 8:12 AM

## 2017-02-18 NOTE — Progress Notes (Signed)
Physical Therapy Evaluation Patient Details Name: Angelica Bell MRN: [id number] DOB: 12-Jul-1932 Today's Date: 02/18/2017   History of Present Illness  S/P L hip fracture (closed displaced fracture of left femur s/p mechanical fall at home). DOS: 02/17/17 PMH: PNA, PE, osteoperosis, chornic pain, chronic back pain R hip fx 2016  Clinical Impression  Patient was pleasantly confused during treatment. She was able to ambulate 12' but required min a for balance and mod cuing for safety. She has decreased safety awareness with all transfers. She lives alone. She would benefit from rehabilitation at a SNF as well as continued skilled acute therapy to improve her general mobility and safety.     Follow Up Recommendations SNF    Equipment Recommendations  Rolling walker with 5" wheels    Recommendations for Other Services       Precautions / Restrictions Precautions Precautions: Fall;Other (comment) Precaution Comments: Contact  Restrictions Weight Bearing Restrictions: Yes LLE Weight Bearing: Weight bearing as tolerated      Mobility  Bed Mobility Overal bed mobility: Needs Assistance Bed Mobility: Supine to Sit     Supine to sit: Min assist;HOB elevated     General bed mobility comments: Min A LLE to get foot off of bed  Transfers Overall transfer level: Needs assistance Equipment used: Rolling walker (2 wheeled) Transfers: Sit to/from [facility] Sit to Stand: Min assist Stand pivot transfers: Min assist       General transfer comment: Overall decreased safety awareness and awareness of deficits, somewhat impulsive and confused/. Min a for strength to transfer. Mod cuing for hand placement.,   Ambulation/Gait Ambulation/Gait assistance: Min assist Ambulation Distance (Feet): 12 Feet Assistive device: Rolling walker (2 wheeled) Gait Pattern/deviations: Step-to pattern;Decreased weight shift to left;Decreased stride length   Gait velocity  interpretation: Below normal speed for age/gender General Gait Details: Cuing required to stay in the walker. She attmepted to sit with no chair behind her bui wass able to correct with cuing. Mod cuing for safety.   Stairs            Wheelchair Mobility    Modified Rankin (Stroke Patients Only)       Balance Overall balance assessment: History of Falls;Needs assistance Sitting-balance support: No upper extremity supported;Feet supported Sitting balance-Leahy Scale: Good     Standing balance support: Bilateral upper extremity supported;During functional activity Standing balance-Leahy Scale: Fair Standing balance comment: H/O Falls. Decreased safety awareness, requires physical assistance with RW for safety/impulsive. May be medication related                             Pertinent Vitals/Pain Pain Assessment: 0-10 Pain Score: 10-Worst pain ever (10/10 reported but no increased signs of pain with mobility) Pain Location: Left hip Pain Descriptors / Indicators: Sore Pain Intervention(s): Limited activity within patient's tolerance;Monitored during session;Premedicated before session;Repositioned;Relaxation    Home Living Family/patient expects to be discharged to:: Skilled nursing facility Living Arrangements: Alone   Type of Home: House Home Access: Stairs to enter Entrance Stairs-Rails: None Entrance Stairs-Number of Steps: 1 Home Layout: One level        Prior Function Level of Independence: Independent with assistive device(s)         Comments: was walking in the house without assistance prior to her fall.      Hand Dominance        Extremity/Trunk Assessment   Upper Extremity Assessment Upper Extremity Assessment: Defer to OT  evaluation    Lower Extremity Assessment Lower Extremity Assessment: LLE deficits/detail LLE Deficits / Details: limited ability to weight bear on the left  LLE: Unable to fully assess due to pain        Communication   Communication: HOH  Cognition Arousal/Alertness: Awake/alert Behavior During Therapy: Impulsive Overall Cognitive Status: No family/caregiver present to determine baseline cognitive functioning Area of Impairment: Attention;Memory;Safety/judgement;Problem solving;Awareness;Following commands     Memory: Decreased recall of precautions;Decreased short-term memory Following Commands: Follows one step commands inconsistently Safety/Judgement: Decreased awareness of safety   Problem Solving: Requires verbal cues;Requires tactile cues;Difficulty sequencing      General Comments      Exercises     Assessment/Plan    PT Assessment Patient needs continued PT services  PT Problem List Decreased strength;Decreased range of motion;Decreased activity tolerance;Decreased balance;Decreased mobility;Decreased safety awareness;Decreased knowledge of use of DME;Pain       PT Treatment Interventions DME instruction;Gait training;Functional mobility training;Therapeutic activities;Therapeutic exercise;Modalities    PT Goals (Current goals can be found in the Care Plan section)  Acute Rehab PT Goals Patient Stated Goal: Go to the same rehab that she went to before PT Goal Formulation: With patient Time For Goal Achievement: 02/25/17 Potential to Achieve Goals: Good    Frequency Min 5X/week   Barriers to discharge Decreased caregiver support will likely D/C to SNF for rehab     Co-evaluation PT/OT/SLP Co-Evaluation/Treatment: Yes Reason for Co-Treatment: Complexity of the patient's impairments (multi-system involvement);For patient/therapist safety;To address functional/ADL transfers PT goals addressed during session: Proper use of DME;Mobility/safety with mobility;Strengthening/ROM;Balance OT goals addressed during session: ADL's and self-care       End of Session Equipment Utilized During Treatment: Gait belt Activity Tolerance: Patient tolerated treatment  well Patient left: in chair;with call bell/phone within reach;with chair alarm set;with nursing/sitter in room (student nursing ) Nurse Communication: Mobility status;Precautions PT Visit Diagnosis: Unsteadiness on feet (R26.81);Other abnormalities of gait and mobility (R26.89);Repeated falls (R29.6);Muscle weakness (generalized) (M62.81);History of falling (Z91.81);Difficulty in walking, not elsewhere classified (R26.2);Pain Pain - Right/Left: Left Pain - part of body: Hip         Time: [id number] PT Time Calculation (min) (ACUTE ONLY): 28 min   Charges:   PT Evaluation $PT Eval Moderate Complexity: 1 Procedure     PT G Codes:         Carney Living PT DPT  02/18/2017, 10:21 AM

## 2017-02-18 NOTE — Discharge Instructions (Signed)
-  Weight bear as tolerated to the left leg -maintain dressing until follow up appointment with surgeon - ok to shower with dressing in place, do not submerge under water -for dvt prevention take 81 mg asa twice daily

## 2017-02-18 NOTE — Clinical Social Work Note (Signed)
CSW has received insurance authorization for pt to admit to [facility] 2/21. Authorization #: [id number], rug level: RVB. Facility notified. MD notified.   [address], [place]

## 2017-02-18 NOTE — Clinical Social Work Placement (Signed)
   CLINICAL SOCIAL WORK PLACEMENT  NOTE  Date:  02/18/2017  Patient Details  Name: Angelica Bell MRN: [id number] Date of Birth: May 06, 1932  Clinical Social Work is seeking post-discharge placement for this patient at the [facility] level of care (*CSW will initial, date and re-position this form in  chart as items are completed):      Patient/family provided with [place] Work Department's list of facilities offering this level of care within the geographic area requested by the patient (or if unable, by the patient's family).  Yes   Patient/family informed of their freedom to choose among providers that offer the needed level of care, that participate in Medicare, Medicaid or managed care program needed by the patient, have an available bed and are willing to accept the patient.      Patient/family informed of [facility]'s ownership interest in [facility] and [facility], as well as of the fact that they are under no obligation to receive care at these facilities.  PASRR submitted to EDS on       PASRR number received on 02/18/17     Existing PASRR number confirmed on       FL2 transmitted to all facilities in geographic area requested by pt/family on 02/18/17     FL2 transmitted to all facilities within larger geographic area on       Patient informed that his/her managed care company has contracts with or will negotiate with certain facilities, including the following:        Yes   Patient/family informed of bed offers received.  Patient chooses bed at [facility]     Physician recommends and patient chooses bed at      Patient to be transferred to [facility] on 02/19/17.  Patient to be transferred to facility by [facility]     Patient family notified on 02/19/17 of transfer.  Name of family member notified:  [name]     PHYSICIAN Please prepare priority discharge summary, including medications, Please prepare prescriptions,  Please sign FL2, Please sign DNR     Additional Comment:    _______________________________________________ Alla German, LCSW 02/18/2017, 11:09 AM

## 2017-02-18 NOTE — Plan of Care (Signed)
Problem: Safety: Goal: Ability to remain free from injury will improve Outcome: Progressing Safety precautions and fall preventions maintained  Problem: Pain Managment: Goal: General experience of comfort will improve Outcome: Progressing Pain well managed with Toradol  Problem: Tissue Perfusion: Goal: Risk factors for ineffective tissue perfusion will decrease Outcome: Progressing SCDs are on  Problem: Activity: Goal: Risk for activity intolerance will decrease Outcome: Progressing Tolerates activities and mobility in the bed well  Problem: Bowel/Gastric: Goal: Will not experience complications related to bowel motility Outcome: Progressing No bowel or gastric issues noted

## 2017-02-19 DIAGNOSIS — G8928 Other chronic postprocedural pain: Secondary | ICD-10-CM

## 2017-02-19 LAB — CBC
HEMATOCRIT: 25.1 % — AB (ref 36.0–46.0)
Hemoglobin: 8.2 g/dL — ABNORMAL LOW (ref 12.0–15.0)
MCH: 28.3 pg (ref 26.0–34.0)
MCHC: 32.7 g/dL (ref 30.0–36.0)
MCV: 86.6 fL (ref 78.0–100.0)
Platelets: 167 10*3/uL (ref 150–400)
RBC: 2.9 MIL/uL — ABNORMAL LOW (ref 3.87–5.11)
RDW: 13.9 % (ref 11.5–15.5)
WBC: 11.2 10*3/uL — ABNORMAL HIGH (ref 4.0–10.5)

## 2017-02-19 MED ORDER — TRAZODONE HCL 50 MG PO TABS: 1250.0000 mg | ORAL_TABLET | Freq: Every evening | ORAL | 0 refills | Status: DC | PRN

## 2017-02-19 MED ORDER — ASPIRIN EC 81 MG PO TBEC: 81.0000 mg | DELAYED_RELEASE_TABLET | Freq: Two times a day (BID) | ORAL | Status: DC

## 2017-02-19 MED ORDER — SENNA 8.6 MG PO TABS: 1.0000 | ORAL_TABLET | Freq: Two times a day (BID) | ORAL | 0 refills | Status: DC

## 2017-02-19 MED ORDER — ASPIRIN EC 325 MG PO TBEC: 325.0000 mg | DELAYED_RELEASE_TABLET | Freq: Two times a day (BID) | ORAL | 0 refills | Status: DC

## 2017-02-19 MED ORDER — FENTANYL 25 MCG/HR TD PT72
25.0000 ug | MEDICATED_PATCH | TRANSDERMAL | Status: DC
  Administered 2017-02-19: 25 ug via TRANSDERMAL
  Filled 2017-02-19: qty 1

## 2017-02-19 MED ORDER — FENTANYL 25 MCG/HR TD PT72: 25.0000 ug | MEDICATED_PATCH | TRANSDERMAL | 0 refills | Status: DC

## 2017-02-19 MED ORDER — ACETAMINOPHEN 325 MG PO TABS
650.0000 mg | ORAL_TABLET | Freq: Four times a day (QID) | ORAL | Status: DC | PRN
Start: 2017-02-19 — End: 2017-03-05

## 2017-02-19 MED ORDER — ALPRAZOLAM 1 MG PO TABS: 0.5000 mg | ORAL_TABLET | Freq: Every day | ORAL | 0 refills | Status: DC | PRN

## 2017-02-19 MED ORDER — DOCUSATE SODIUM 100 MG PO CAPS: 100.0000 mg | ORAL_CAPSULE | Freq: Two times a day (BID) | ORAL | 0 refills | Status: DC

## 2017-02-19 MED ORDER — OXYCODONE HCL 30 MG PO TABS: 30.0000 mg | ORAL_TABLET | Freq: Four times a day (QID) | ORAL | 0 refills | Status: DC | PRN

## 2017-02-19 MED ORDER — METHOCARBAMOL 500 MG PO TABS: 500.0000 mg | ORAL_TABLET | Freq: Four times a day (QID) | ORAL | 0 refills | Status: DC | PRN

## 2017-02-19 NOTE — Discharge Summary (Signed)
[facility]  Physician Discharge Summary   Patient ID: [name] MRN: [id number] DOB/AGE: [birth year] 81 y.o.  Admit date: 02/16/2017 Discharge date: 02/19/2017  PCP: Angelica Salt, MD  DISCHARGE DIAGNOSES:  Principal Problem:   Closed displaced intertrochanteric fracture of left femur ([place]) Active Problems:   Hypokalemia   Chronic pain   Essential hypertension   RECOMMENDATIONS FOR OUTPATIENT FOLLOW UP: 1. CBC and basic metabolic panel on Monday 2. Follow-up with orthopedics in 2 weeks  DISCHARGE CONDITION: fair  Diet recommendation: Heart healthy  INITIAL HISTORY: Angelica H Smithis an 84 y.o.femalewith a history of right hip fracture 2016 who presented with left hip fracture following mechanical fall at home.   Consultations:  Orthopedics: Angelica Bell  Procedures: Treatment of intertrochanteric, pertrochanteric, subtrochanteric fracture with intramedullary implant.    HOSPITAL COURSE:   Left intertrochanteric hip fracture This was a result of a mechanical fall. Patient was seen by orthopedics. She underwent IM nail by Angelica Bell 2/19. Orthopedics recommends aspirin twice a day for DVT prophylaxis. Patient did have postoperative anemia with hemoglobin dropping down to 8.2. No overt bleeding has been noted. Some bruising noted over the left thigh which is to be expected. Monitor hemoglobin at the skilled nursing facility.  Acute on chronic pain Verified on [place] CSRS that the patient receives oxycodone 30mg  #90 for 30-day supply as well as fentanyl 63mcg patch. Both last prescribed 1/19. According to the patient. Fentanyl patch is being titrated down. She tells me that she is still on the 25 g patch. This will be continued for now and further management per her outpatient provider, Angelica Bell. Since she is going to a skilled nursing facility, she will need prescription for her pain medications, which will be written. Bowel regimen also ordered  Elevated  blood pressure Not noted to be on any medications for blood pressure control at home. Elevated blood pressure could be due to pain. Continue to monitor at skilled nursing facility.  Hypokalemia Mild, corrected with K in IVF.   Leukocytosis Presumed reactive to procedure, not infection. Has improved significantly.  Overall improved. Somewhat anxious this morning. Concerned about going to nursing facility. No specific complaints. She is awake and alert. Oriented to person, place, time. No focal neurological deficits. Okay for discharge to SNF today.   PERTINENT LABS:  The results of significant diagnostics from this hospitalization (including imaging, microbiology, ancillary and laboratory) are listed below for reference.    Microbiology: Recent Results (from the past 240 hour(s))  Surgical pcr screen     Status: Abnormal   Collection Time: 02/16/17  6:56 PM  Result Value Ref Range Status   MRSA, PCR POSITIVE (A) NEGATIVE Final    Comment: CRITICAL RESULT CALLED TO, READ BACK BY AND VERIFIED WITH: Earl Lites, RN, 02/16/17 AT 2042 BY J FUDESCO    Staphylococcus aureus POSITIVE (A) NEGATIVE Final    Comment:        The Xpert SA Assay (FDA approved for NASAL specimens in patients over 44 years of age), is one component of a comprehensive surveillance program.  Test performance has been validated by [facility] for patients greater than or equal to 60 year old. It is not intended to diagnose infection nor to guide or monitor treatment. CRITICAL RESULT CALLED TO, READ BACK BY AND VERIFIED WITH: Earl Lites, RN, 02/13/17 AT 2042 BY J FUDESCO      Labs: Basic Metabolic Panel:  Recent Labs Lab 02/16/17 1454 02/16/17 1508 02/17/17 1631 02/18/17 [id number]  NA 140 141  --  136  K 3.2* 3.3*  --  4.4  CL 105 104  --  100*  CO2 26  --   --  28  GLUCOSE 132* 134*  --  147*  BUN 18 17  --  19  CREATININE 0.76 0.80 0.67 0.93  CALCIUM 9.6  --   --  9.0   CBC:  Recent Labs Lab  02/16/17 1454 02/16/17 1508 02/17/17 1631 02/18/17 0526 02/19/17 0400  WBC 15.0*  --  13.1* 16.0* 11.2*  NEUTROABS 10.6*  --   --   --   --   HGB 12.5 12.9 10.1* 9.2* 8.2*  HCT 37.9 38.0 31.2* 27.9* 25.1*  MCV 85.6  --  86.7 86.4 86.6  PLT 300  --  200 197 167     IMAGING STUDIES Dg Chest 2 View  Result Date: 02/16/2017 CLINICAL DATA:  Pain following fall EXAM: CHEST  2 VIEW COMPARISON:  August 02, 2016 FINDINGS: There is no edema or consolidation. Heart size and pulmonary vascularity are normal. No adenopathy. There is atherosclerotic calcification in the aorta. No pneumothorax. There is an old healed fracture proximal left humerus with remodeling. There is thoracolumbar levoscoliosis. IMPRESSION: No edema or consolidation. Aortic atherosclerosis. Old healed fracture with remodeling left proximal humerus. Marked scoliosis. Electronically Signed   By: Lowella Grip III M.D.   On: 02/16/2017 14:56   Ct Head Wo Contrast  Result Date: 02/16/2017 CLINICAL DATA:  83 year old female with history of trauma after a fall in her kitchen. Injury to the head. No loss of consciousness. EXAM: CT HEAD WITHOUT CONTRAST TECHNIQUE: Contiguous axial images were obtained from the base of the skull through the vertex without intravenous contrast. COMPARISON:  Head CT 04/19/2015. FINDINGS: Brain: Mild cerebral atrophy. Patchy and confluent areas of decreased attenuation are noted throughout the deep and periventricular white matter of the cerebral hemispheres bilaterally, compatible with chronic microvascular ischemic disease. Well-defined area of low attenuation in the right cerebellar hemisphere, new compared to the prior examination, but most compatible with an area of encephalomalacia from old infarct. No evidence of acute infarction, hemorrhage, hydrocephalus, extra-axial collection or mass lesion/mass effect. Vascular: Cerebrovascular atherosclerosis without acute abnormalities. Skull: Normal. Negative for  fracture or focal lesion. Sinuses/Orbits: No acute finding. Other: None. IMPRESSION: 1. No evidence of significant acute traumatic injury to the skull or brain. 2. No acute intracranial abnormalities. 3. Mild cerebral atrophy with chronic microvascular ischemic changes and evidence of remote right cerebellar infarct, as above. 4. Cerebrovascular atherosclerosis. Electronically Signed   By: Vinnie Langton M.D.   On: 02/16/2017 15:03   Dg C-arm 1-60 Min  Result Date: 02/17/2017 CLINICAL DATA:  Left femur ORIF EXAM: LEFT FEMUR 2 VIEWS; DG C-ARM 61-120 MIN COMPARISON:  Yesterday FINDINGS: Intraoperative fluoroscopy shows placement of a femoral nail with dynamic hip screw. There is anatomic alignment at the patient's intertrochanteric femur fracture. No evidence of intraoperative fracture. IMPRESSION: Fluoroscopy for intertrochanteric femur fracture ORIF. No unexpected finding. Electronically Signed   By: Monte Fantasia M.D.   On: 02/17/2017 14:29   Dg Hip Unilat W Or Wo Pelvis 2-3 Views Left  Result Date: 02/16/2017 CLINICAL DATA:  Pain following fall EXAM: DG HIP (WITH OR WITHOUT PELVIS) 2-3V LEFT COMPARISON:  None. FINDINGS: Frontal pelvis as well as frontal and lateral left hip images were obtained. There is an intertrochanteric femur fracture on the left with medial angulation distally. There is a total hip prosthesis on the right which  appears well seated. No dislocation. There is moderate narrowing of the left hip joint. There is lower lumbar levoscoliosis. There is atherosclerotic calcification in the distal aorta and right common iliac artery. IMPRESSION: Intertrochanteric femur fracture on the left with medial angulation distally. No dislocation. Total hip prosthesis on the right, well-seated. Narrowing left hip joint. Aortoiliac atherosclerosis. Lower lumbar levoscoliosis. Electronically Signed   By: Lowella Grip III M.D.   On: 02/16/2017 14:58   Dg Femur Min 2 Views Left  Result Date:  02/17/2017 CLINICAL DATA:  Left femur ORIF EXAM: LEFT FEMUR 2 VIEWS; DG C-ARM 61-120 MIN COMPARISON:  Yesterday FINDINGS: Intraoperative fluoroscopy shows placement of a femoral nail with dynamic hip screw. There is anatomic alignment at the patient's intertrochanteric femur fracture. No evidence of intraoperative fracture. IMPRESSION: Fluoroscopy for intertrochanteric femur fracture ORIF. No unexpected finding. Electronically Signed   By: Monte Fantasia M.D.   On: 02/17/2017 14:29    DISCHARGE EXAMINATION: Vitals:   02/17/17 1443 02/17/17 2021 02/18/17 2032 02/19/17 0523  BP: (!) 132/92 101/64 114/68 110/61  Pulse: 94 80 95 84  Resp: 18 15 18 18   Temp: 98.9 F (37.2 C) 98.3 F (36.8 C) 97.2 F (36.2 C) 98.1 F (36.7 C)  TempSrc: Oral  Oral Oral  SpO2: 94% 90% 97% 99%   General appearance: alert, cooperative, appears stated age, distracted and no distress Resp: clear to auscultation bilaterally Cardio: regular rate and rhythm, S1, S2 normal, no murmur, click, rub or gallop GI: soft, non-tender; bowel sounds normal; no masses,  no organomegaly Extremities: Bruising noted over her left thigh laterally. Moving all her extremities.  DISPOSITION: SNF  Discharge Instructions    Call MD for:  difficulty breathing, headache or visual disturbances    Complete by:  As directed    Call MD for:  extreme fatigue    Complete by:  As directed    Call MD for:  persistant dizziness or light-headedness    Complete by:  As directed    Call MD for:  persistant nausea and vomiting    Complete by:  As directed    Call MD for:  severe uncontrolled pain    Complete by:  As directed    Call MD for:  temperature >100.4    Complete by:  As directed    Discharge instructions    Complete by:  As directed    Please see instructions on discharge summary.  You were cared for by a hospitalist during your hospital stay. If you have any questions about your discharge medications or the care you received  while you were in the hospital after you are discharged, you can call the unit and asked to speak with the hospitalist on call if the hospitalist that took care of you is not available. Once you are discharged, your primary care physician will handle any further medical issues. Please note that NO REFILLS for any discharge medications will be authorized once you are discharged, as it is imperative that you return to your primary care physician (or establish a relationship with a primary care physician if you do not have one) for your aftercare needs so that they can reassess your need for medications and monitor your lab values. If you do not have a primary care physician, you can call [phone] for a physician referral.   Increase activity slowly    Complete by:  As directed       ALLERGIES:  Allergies  Allergen Reactions  . Amoxicillin-Pot  Clavulanate Hives and Diarrhea    Has patient had a PCN reaction causing immediate rash, facial/tongue/throat swelling, SOB or lightheadedness with hypotension: yes Has patient had a PCN reaction causing severe rash involving mucus membranes or skin necrosis: yes Has patient had a PCN reaction that required hospitalization : unknown Has patient had a PCN reaction occurring within the last 10 years: yes If all of the above answers are "NO", then may proceed with Cephalosporin use.   Marland Kitchen Lisinopril-Hydrochlorothiazide Other (See Comments)    Tired- no energy to do anything..  . Sulfonamide Derivatives Hives      Current Discharge Medication List    START taking these medications   Details  acetaminophen (TYLENOL) 325 MG tablet Take 2 tablets (650 mg total) by mouth every 6 (six) hours as needed for mild pain (or Fever >/= 101).    docusate sodium (COLACE) 100 MG capsule Take 1 capsule (100 mg total) by mouth 2 (two) times daily. Qty: 10 capsule, Refills: 0    fentaNYL (DURAGESIC - DOSED MCG/HR) 25 MCG/HR patch Place 1 patch (25 mcg total) onto the skin  every 3 (three) days. Qty: 5 patch, Refills: 0    methocarbamol (ROBAXIN) 500 MG tablet Take 1 tablet (500 mg total) by mouth every 6 (six) hours as needed for muscle spasms. Qty: 30 tablet, Refills: 0    senna (SENOKOT) 8.6 MG TABS tablet Take 1 tablet (8.6 mg total) by mouth 2 (two) times daily. Qty: 120 each, Refills: 0      CONTINUE these medications which have CHANGED   Details  ALPRAZolam (XANAX) 1 MG tablet Take 0.5 tablets (0.5 mg total) by mouth daily as needed for anxiety or sleep. Qty: 10 tablet, Refills: 0    aspirin EC 81 MG tablet Take 1 tablet (81 mg total) by mouth 2 (two) times daily.    oxycodone (ROXICODONE) 30 MG immediate release tablet Take 1 tablet (30 mg total) by mouth every 6 (six) hours as needed (severe pain). Qty: 120 tablet, Refills: 0    traZODone (DESYREL) 50 MG tablet Take 25-50 tablets (1,250-2,500 mg total) by mouth at bedtime as needed for sleep. Qty: 15 tablet, Refills: 0      CONTINUE these medications which have NOT CHANGED   Details  escitalopram (LEXAPRO) 20 MG tablet Take 20 mg by mouth daily.  Refills: 4    fluorometholone (FML) 0.1 % ophthalmic suspension Place 1 drop into both eyes daily.  Refills: 0    Multiple Vitamin (MULTIVITAMIN) tablet Take 1 tablet by mouth daily.      STOP taking these medications     fentaNYL (DURAGESIC - DOSED MCG/HR) 50 MCG/HR           Contact information for follow-up providers    Nicholes Stairs, MD. Schedule an appointment as soon as possible for a visit in 2 week(s).   Specialty:  Orthopedic Surgery Contact information: [address] [place] [address] [id number]        RAMOS,RICHARD D, MD. Schedule an appointment as soon as possible for a visit in 3 week(s).   Specialty:  Physical Medicine and Rehabilitation Contact information: [address] [place] [address] [place] [address] [id number]            Contact information for after-discharge care     Destination    [facility] Follow up.   Specialty:  Skilled Nursing Engineer, manufacturing information: [place] [phone]  TOTAL DISCHARGE TIME: 35 minutes  [facility] Pager [phone]  02/19/2017, 11:34 AM

## 2017-02-19 NOTE — Plan of Care (Signed)
Problem: Pain Managment: Goal: General experience of comfort will improve Outcome: Progressing Medicated twice for pain this shift, sleeping at present time  Problem: Skin Integrity: Goal: Risk for impaired skin integrity will decrease Outcome: Progressing No skin issues noted other than the left hip incision  Problem: Tissue Perfusion: Goal: Risk factors for ineffective tissue perfusion will decrease Outcome: Progressing SCDs are on  Problem: Activity: Goal: Risk for activity intolerance will decrease Outcome: Progressing Tolerates activity well  Problem: Bowel/Gastric: Goal: Will not experience complications related to bowel motility Outcome: Progressing No gastric or bowel issues reported

## 2017-02-19 NOTE — Clinical Social Work Note (Addendum)
Clinical Social Worker facilitated patient discharge including contacting patient family and facility to confirm patient discharge plans.  Clinical information faxed to facility and family agreeable with plan.  CSW arranged ambulance transport via [facility] to [facility] .  RN to call [phone] for report prior to discharge. Patient will go to room 301B.  Clinical Social Worker will sign off for now as social work intervention is no longer needed. Please consult [place] again if new need arises.  [address] [place]

## 2017-02-19 NOTE — Progress Notes (Addendum)
Physical Therapy Treatment Patient Details Name: Angelica Bell MRN: [id number] DOB: [birth year] Today's Date: 02/19/2017    History of Present Illness S/P L hip fracture (closed displaced fracture of left femur s/p mechanical fall at home). DOS: 02/17/17 PMH: PNA, PE, osteoperosis, chornic pain, chronic back pain R hip fx 2016    PT Comments    Pt progressing slowly with mobility, requiring mod assist for ambulation. Pt with poor safety awareness during session are requiring physical and verbal assistance for safety. Continuing to recommend SNF for further care following acute stay.    Follow Up Recommendations  SNF;Supervision/Assistance - 24 hour     Equipment Recommendations  Rolling walker with 5" wheels    Recommendations for Other Services       Precautions / Restrictions Precautions Precautions: Fall Restrictions Weight Bearing Restrictions: Yes LLE Weight Bearing: Weight bearing as tolerated    Mobility  Bed Mobility Overal bed mobility: Needs Assistance Bed Mobility: Supine to Sit     Supine to sit: Mod assist;HOB elevated     General bed mobility comments: assist provided at trunk and with LLE  Transfers Overall transfer level: Needs assistance Equipment used: Rolling walker (2 wheeled) Transfers: Sit to/from [facility] Sit to Stand: Mod assist Stand pivot transfers: Mod assist (chair<>BSC)       General transfer comment: pt needing physical assist to stand as well as repeated cues for safety. Pt easily distracted.   Ambulation/Gait Ambulation/Gait assistance: Mod assist Ambulation Distance (Feet): 12 Feet Assistive device: Rolling walker (2 wheeled) Gait Pattern/deviations: Step-to pattern;Decreased weight shift to left;Decreased stance time - left Gait velocity: slow pattern   General Gait Details: Assist needed to advance LLE and during stance phase on Lt.    Stairs            Wheelchair Mobility    Modified Rankin  (Stroke Patients Only)       Balance Overall balance assessment: History of Falls;Needs assistance Sitting-balance support: No upper extremity supported Sitting balance-Leahy Scale: Fair     Standing balance support: Bilateral upper extremity supported Standing balance-Leahy Scale: Poor Standing balance comment: requiring rw for support and supervision for safety                    Cognition Arousal/Alertness: Awake/alert Behavior During Therapy: Impulsive Overall Cognitive Status: No family/caregiver present to determine baseline cognitive functioning Area of Impairment: Orientation;Safety/judgement Orientation Level: Place (knows she is in the hospital but unsure where. )   Memory: Decreased short-term memory Following Commands: Follows one step commands inconsistently Safety/Judgement: Decreased awareness of safety          Exercises      General Comments        Pertinent Vitals/Pain Pain Assessment: Faces Faces Pain Scale: Hurts even more Pain Location: Lt hip Pain Descriptors / Indicators: Sore Pain Intervention(s): Limited activity within patient's tolerance;Monitored during session;Repositioned    Home Living                      Prior Function            PT Goals (current goals can now be found in the care plan section) Acute Rehab PT Goals Patient Stated Goal: not expressed PT Goal Formulation: With patient Time For Goal Achievement: 02/25/17 Potential to Achieve Goals: Good Progress towards PT goals: Progressing toward goals    Frequency    Min 3X/week      PT Plan Frequency needs to  be updated    Co-evaluation             End of Session Equipment Utilized During Treatment: Gait belt Activity Tolerance: Patient limited by fatigue Patient left: in chair;with call bell/phone within reach;with chair alarm set Nurse Communication: Mobility status;Precautions PT Visit Diagnosis: Unsteadiness on feet (R26.81);Other  abnormalities of gait and mobility (R26.89);Repeated falls (R29.6);Muscle weakness (generalized) (M62.81);History of falling (Z91.81);Difficulty in walking, not elsewhere classified (R26.2);Pain Pain - Right/Left: Left Pain - part of body: Hip     Time: [id number] PT Time Calculation (min) (ACUTE ONLY): 34 min  Charges:  $Gait Training: 8-22 mins $Therapeutic Activity: 8-22 mins                    G Codes:       Cassell Clement, PT, CSCS Pager [phone] Office [phone]  02/19/2017, 12:18 PM

## 2017-02-19 NOTE — Care Management (Signed)
Patient is for SNF for shortterm rehab. Will go to [facility]. Social Worker has arranged.

## 2017-02-20 ENCOUNTER — Encounter: Payer: Self-pay | Admitting: Adult Health

## 2017-02-20 ENCOUNTER — Non-Acute Institutional Stay (SKILLED_NURSING_FACILITY): Payer: Medicare Other | Admitting: Adult Health

## 2017-02-20 DIAGNOSIS — K5900 Constipation, unspecified: Secondary | ICD-10-CM | POA: Diagnosis not present

## 2017-02-20 DIAGNOSIS — G4700 Insomnia, unspecified: Secondary | ICD-10-CM | POA: Diagnosis not present

## 2017-02-20 DIAGNOSIS — F339 Major depressive disorder, recurrent, unspecified: Secondary | ICD-10-CM | POA: Diagnosis not present

## 2017-02-20 DIAGNOSIS — R2681 Unsteadiness on feet: Secondary | ICD-10-CM

## 2017-02-20 DIAGNOSIS — D62 Acute posthemorrhagic anemia: Secondary | ICD-10-CM

## 2017-02-20 DIAGNOSIS — F419 Anxiety disorder, unspecified: Secondary | ICD-10-CM

## 2017-02-20 DIAGNOSIS — S72142A Displaced intertrochanteric fracture of left femur, initial encounter for closed fracture: Secondary | ICD-10-CM

## 2017-02-20 NOTE — Progress Notes (Signed)
This encounter was created in error - please disregard.

## 2017-02-20 NOTE — Progress Notes (Signed)
DATE:  02/20/2017   MRN:  [id number]  BIRTHDAY: 1932-02-20  Facility:  [facility]ursing Home Location:  [place] Room Number: 301-B  LEVEL OF CARE:  SNF (31)  Contact Information    Name Relation Home Work [place] [phone]     No name specified           Code Status History    Date Active Date Inactive Code Status Order ID Comments User Context   02/16/2017  6:35 PM 02/19/2017  4:33 PM DNR [id number]  Gwynne Edinger, MD Inpatient   12/25/2015  1:11 AM 12/27/2015  7:17 PM Full Code [id number]  Edwin Dada, MD Inpatient    Questions for Most Recent Historical Code Status (Order [id number])    Question Answer Comment   In the event of cardiac or respiratory ARREST Do not call a "code blue"    In the event of cardiac or respiratory ARREST Do not perform Intubation, CPR, defibrillation or ACLS    In the event of cardiac or respiratory ARREST Use medication by any route, position, wound care, and other measures to relive pain and suffering. May use oxygen, suction and manual treatment of airway obstruction as needed for comfort.        Chief Complaint  Patient presents with  . Hospitalization Follow-up    HISTORY OF PRESENT ILLNESS:  This is an 83-YO female seen for hospital follow-up.  She was admitted to [facility] for a short-term rehabilitation on 02/19/2017 following an admission at [facility] 02/16/2017-02/19/2017 for left hip IM nailing, S/P  mechanical fall at home wherein she sustained a left hip fracture.    She was seen in the room today and did not verbalize any concerns.     PAST ME at bedsideDICAL HISTORY:  Past Medical History:  Diagnosis Date  . Chronic back pain    with degenerative joint disease  . Chronic pain disorder    with narcotic management  . Dry eyes   . Hypertension   . Osteoporosis   . PE (pulmonary embolism)    After hysterectomy in 1967  . Pneumonia       CURRENT MEDICATIONS: Reviewed  Patient's Medications  New Prescriptions   No medications on file  Previous Medications   ACETAMINOPHEN (TYLENOL) 325 MG TABLET    Take 2 tablets (650 mg total) by mouth every 6 (six) hours as needed for mild pain (or Fever >/= 101).   ALPRAZOLAM (XANAX) 0.5 MG TABLET    Take 0.5 mg by mouth daily as needed for anxiety or sleep.   ASPIRIN EC 81 MG TABLET    Take 1 tablet (81 mg total) by mouth 2 (two) times daily.   DOCUSATE SODIUM (COLACE) 100 MG CAPSULE    Take 1 capsule (100 mg total) by mouth 2 (two) times daily.   ESCITALOPRAM (LEXAPRO) 20 MG TABLET    Take 20 mg by mouth daily.    FENTANYL (DURAGESIC - DOSED MCG/HR) 25 MCG/HR PATCH    Place 1 patch (25 mcg total) onto the skin every 3 (three) days.   FLUOROMETHOLONE (FML) 0.1 % OPHTHALMIC SUSPENSION    Place 1 drop into both eyes daily.    METHOCARBAMOL (ROBAXIN) 500 MG TABLET    Take 1 tablet (500 mg total) by mouth every 6 (six) hours as needed for muscle spasms.   MULTIPLE VITAMIN (MULTIVITAMIN) TABLET    Take 1 tablet by mouth  daily.   OXYCODONE (ROXICODONE) 30 MG IMMEDIATE RELEASE TABLET    Take 1 tablet (30 mg total) by mouth every 6 (six) hours as needed (severe pain).   SENNA (SENOKOT) 8.6 MG TABS TABLET    Take 1 tablet (8.6 mg total) by mouth 2 (two) times daily.   TRAZODONE (DESYREL) 50 MG TABLET    Take 50 mg by mouth at bedtime as needed for sleep.  Modified Medications   No medications on file  Discontinued Medications   ALPRAZOLAM (XANAX) 1 MG TABLET    Take 0.5 tablets (0.5 mg total) by mouth daily as needed for anxiety or sleep.   TRAZODONE (DESYREL) 50 MG TABLET    Take 25-50 tablets (1,250-2,500 mg total) by mouth at bedtime as needed for sleep.     Allergies  Allergen Reactions  . Amoxicillin-Pot Clavulanate Hives and Diarrhea    Has patient had a PCN reaction causing immediate rash, facial/tongue/throat swelling, SOB or lightheadedness with hypotension: yes Has patient had  a PCN reaction causing severe rash involving mucus membranes or skin necrosis: yes Has patient had a PCN reaction that required hospitalization : unknown Has patient had a PCN reaction occurring within the last 10 years: yes If all of the above answers are "NO", then may proceed with Cephalosporin use.   Marland Kitchen Lisinopril-Hydrochlorothiazide Other (See Comments)    Tired- no energy to do anything..  . Sulfonamide Derivatives Hives     REVIEW OF SYSTEMS:  GENERAL: no change in appetite, no fatigue, no weight changes, no fever, chills or weakness EYES: Denies change in vision, dry eyes, eye pain, itching or discharge EARS: Denies change in hearing, ringing in ears, or earache NOSE: Denies nasal congestion or epistaxis MOUTH and THROAT: Denies oral discomfort, gingival pain or bleeding, pain from teeth or hoarseness   RESPIRATORY: no cough, SOB, DOE, wheezing, hemoptysis CARDIAC: no chest pain, edema or palpitations GI: no abdominal pain, diarrhea, constipation, heart burn, nausea or vomiting GU: Denies dysuria, frequency, hematuria, incontinence, or discharge PSYCHIATRIC: Denies feeling of depression or anxiety. No report of hallucinations, insomnia, paranoia, or agitation    PHYSICAL EXAMINATION  GENERAL APPEARANCE: Well nourished. In no acute distress. Normal body habitus SKIN:  Left thigh has 3 surgical sites with staples, dry, has bruising HEAD: Normal in size and contour. No evidence of trauma EYES: Lids open and close normally. No blepharitis, entropion or ectropion. PERRL. Conjunctivae are clear and sclerae are white. Lenses are without opacity EARS: Pinnae are normal.hard of hearingOUTH and THROAT: Lips are without lesions. Oral mucosa is moist and without lesions. Tongue is normal in shape, size, and color and without lesions NECK: supple, trachea midline, no neck masses, no thyroid tenderness, no thyromegaly LYMPHATICS: no LAN in the neck, no supraclavicular LAN RESPIRATORY:  breathing is even & unlabored, BS CTAB CARDIAC: RRR, no murmur,no extra heart sounds, LLE 1+ edema GI: abdomen soft, normal BS, no masses, no tenderness, no hepatomegaly, no splenomegaly EXTREMITIES:  Able to move X 4 extremities PSYCHIATRIC: Alert and oriented X 3. Affect and behavior are appropriate   LABS/RADIOLOGY: Labs reviewed: Basic Metabolic Panel:  Recent Labs  02/16/17 1454 02/16/17 1508 02/17/17 1631 02/18/17 0526  NA 140 141  --  136  K 3.2* 3.3*  --  4.4  CL 105 104  --  100*  CO2 26  --   --  28  GLUCOSE 132* 134*  --  147*  BUN 18 17  --  19  CREATININE 0.76 0.80  0.67 0.93  CALCIUM 9.6  --   --  9.0   CBC:  Recent Labs  02/16/17 1454  02/17/17 1631 02/18/17 0526 02/19/17 0400  WBC 15.0*  --  13.1* 16.0* 11.2*  NEUTROABS 10.6*  --   --   --   --   HGB 12.5  < > 10.1* 9.2* 8.2*  HCT 37.9  < > 31.2* 27.9* 25.1*  MCV 85.6  --  86.7 86.4 86.6  PLT 300  --  200 197 167  < > = values in this interval not displayed. CBG:  Recent Labs  05/08/16 1514  GLUCAP 70      Dg Chest 2 View  Result Date: 02/16/2017 CLINICAL DATA:  Pain following fall EXAM: CHEST  2 VIEW COMPARISON:  August 02, 2016 FINDINGS: There is no edema or consolidation. Heart size and pulmonary vascularity are normal. No adenopathy. There is atherosclerotic calcification in the aorta. No pneumothorax. There is an old healed fracture proximal left humerus with remodeling. There is thoracolumbar levoscoliosis. IMPRESSION: No edema or consolidation. Aortic atherosclerosis. Old healed fracture with remodeling left proximal humerus. Marked scoliosis. Electronically Signed   By: Lowella Grip III M.D.   On: 02/16/2017 14:56   Ct Head Wo Contrast  Result Date: 02/16/2017 CLINICAL DATA:  83 year old female with history of trauma after a fall in her kitchen. Injury to the head. No loss of consciousness. EXAM: CT HEAD WITHOUT CONTRAST TECHNIQUE: Contiguous axial images were obtained from the base  of the skull through the vertex without intravenous contrast. COMPARISON:  Head CT 04/19/2015. FINDINGS: Brain: Mild cerebral atrophy. Patchy and confluent areas of decreased attenuation are noted throughout the deep and periventricular white matter of the cerebral hemispheres bilaterally, compatible with chronic microvascular ischemic disease. Well-defined area of low attenuation in the right cerebellar hemisphere, new compared to the prior examination, but most compatible with an area of encephalomalacia from old infarct. No evidence of acute infarction, hemorrhage, hydrocephalus, extra-axial collection or mass lesion/mass effect. Vascular: Cerebrovascular atherosclerosis without acute abnormalities. Skull: Normal. Negative for fracture or focal lesion. Sinuses/Orbits: No acute finding. Other: None. IMPRESSION: 1. No evidence of significant acute traumatic injury to the skull or brain. 2. No acute intracranial abnormalities. 3. Mild cerebral atrophy with chronic microvascular ischemic changes and evidence of remote right cerebellar infarct, as above. 4. Cerebrovascular atherosclerosis. Electronically Signed   By: Vinnie Langton M.D.   On: 02/16/2017 15:03   Dg C-arm 1-60 Min  Result Date: 02/17/2017 CLINICAL DATA:  Left femur ORIF EXAM: LEFT FEMUR 2 VIEWS; DG C-ARM 61-120 MIN COMPARISON:  Yesterday FINDINGS: Intraoperative fluoroscopy shows placement of a femoral nail with dynamic hip screw. There is anatomic alignment at the patient's intertrochanteric femur fracture. No evidence of intraoperative fracture. IMPRESSION: Fluoroscopy for intertrochanteric femur fracture ORIF. No unexpected finding. Electronically Signed   By: Monte Fantasia M.D.   On: 02/17/2017 14:29   Dg Hip Unilat W Or Wo Pelvis 2-3 Views Left  Result Date: 02/16/2017 CLINICAL DATA:  Pain following fall EXAM: DG HIP (WITH OR WITHOUT PELVIS) 2-3V LEFT COMPARISON:  None. FINDINGS: Frontal pelvis as well as frontal and lateral left hip  images were obtained. There is an intertrochanteric femur fracture on the left with medial angulation distally. There is a total hip prosthesis on the right which appears well seated. No dislocation. There is moderate narrowing of the left hip joint. There is lower lumbar levoscoliosis. There is atherosclerotic calcification in the distal aorta and right common iliac  artery. IMPRESSION: Intertrochanteric femur fracture on the left with medial angulation distally. No dislocation. Total hip prosthesis on the right, well-seated. Narrowing left hip joint. Aortoiliac atherosclerosis. Lower lumbar levoscoliosis. Electronically Signed   By: Lowella Grip III M.D.   On: 02/16/2017 14:58   Dg Femur Min 2 Views Left  Result Date: 02/17/2017 CLINICAL DATA:  Left femur ORIF EXAM: LEFT FEMUR 2 VIEWS; DG C-ARM 61-120 MIN COMPARISON:  Yesterday FINDINGS: Intraoperative fluoroscopy shows placement of a femoral nail with dynamic hip screw. There is anatomic alignment at the patient's intertrochanteric femur fracture. No evidence of intraoperative fracture. IMPRESSION: Fluoroscopy for intertrochanteric femur fracture ORIF. No unexpected finding. Electronically Signed   By: Monte Fantasia M.D.   On: 02/17/2017 14:29    ASSESSMENT/PLAN:  Unsteady gait - for rehabilitation, PT and OT, for therapeutic strengthening exercises; fall precautions   Closed displaced intertrochanteric fracture of left femur S/P IM nail -  for rehabilitation, PT and OT, for therapeutic strengthening exercises; continue aspirin EC 81 mg 1 tab by mouth twice a day for DVT prophylaxis; oxycodone 30 mg IR 1 tab by mouth every 6 hours when necessary, Tylenol 325 mg 2 tabs = 650 mg by mouth every 6 hours when necessary and fentanyl 25 g/hour 1 patch to skin every 3 days for pain; Robaxin 500 mg 1 tab by mouth every 6 hours when necessary for muscle.; Follow-up with Dr. Nicholes Stairs, orthopedic surgeon, in 2 weeks  Depression  - continue  Lexapro 20 mg 1 tab by mouth daily; check BMP   Insomnia - continue trazodone 50 mg 1 tab by mouth daily at bedtime when necessary  Anxiety - mood is stable; continue Xanax 0.5 mg 1 tab by mouth daily when necessary  Constipation - start senna S 2 tabs by mouth twice a day and discontinue Colace and senna  Anemia, acute blood loss - check CBC Lab Results  Component Value Date   HGB 8.2 (L) 02/19/2017     Goals of care:  Short-term rehabilitation    Monina C. Medina-Vargas - NP    [employer] [facility] [phone]

## 2017-02-24 LAB — BASIC METABOLIC PANEL
BUN: 15 mg/dL (ref 4–21)
CREATININE: 0.7 mg/dL (ref 0.5–1.1)
GLUCOSE: 175 mg/dL
POTASSIUM: 4 mmol/L (ref 3.4–5.3)
SODIUM: 138 mmol/L (ref 137–147)

## 2017-02-24 LAB — CBC AND DIFFERENTIAL
HEMATOCRIT: 29 % — AB (ref 36–46)
HEMOGLOBIN: 9 g/dL — AB (ref 12.0–16.0)
Neutrophils Absolute: 7 /uL
Platelets: 350 10*3/uL (ref 150–399)
WBC: 10.2 10*3/mL

## 2017-03-05 ENCOUNTER — Encounter: Payer: Self-pay | Admitting: Internal Medicine

## 2017-03-05 ENCOUNTER — Non-Acute Institutional Stay (SKILLED_NURSING_FACILITY): Payer: Medicare Other | Admitting: Internal Medicine

## 2017-03-05 ENCOUNTER — Encounter: Payer: Self-pay | Admitting: Adult Health

## 2017-03-05 DIAGNOSIS — G8928 Other chronic postprocedural pain: Secondary | ICD-10-CM | POA: Diagnosis not present

## 2017-03-05 DIAGNOSIS — F329 Major depressive disorder, single episode, unspecified: Secondary | ICD-10-CM | POA: Diagnosis not present

## 2017-03-05 DIAGNOSIS — R2681 Unsteadiness on feet: Secondary | ICD-10-CM | POA: Diagnosis not present

## 2017-03-05 DIAGNOSIS — F32A Depression, unspecified: Secondary | ICD-10-CM

## 2017-03-05 DIAGNOSIS — K5900 Constipation, unspecified: Secondary | ICD-10-CM

## 2017-03-05 DIAGNOSIS — S72142S Displaced intertrochanteric fracture of left femur, sequela: Secondary | ICD-10-CM

## 2017-03-05 DIAGNOSIS — G4700 Insomnia, unspecified: Secondary | ICD-10-CM

## 2017-03-05 NOTE — Progress Notes (Signed)
LOCATION: [facility]  PCP: RAMOS,RICHARD D, MD   Code Status: full code  Goals of care: Advanced Directive information Advanced Directives 02/16/2017  Does Patient Have a Medical Advance Directive? No  Type of Advance Directive -  Does patient want to make changes to medical advance directive? -  Copy of [place] in Chart? -  Would patient like information on creating a medical advance directive? No - Patient declined  Pre-existing out of facility DNR order (yellow form or pink MOST form) -       Extended Emergency Contact Information Primary Emergency Contact: Essie Christine [place] Phone: [phone] Relation: Grandson   Allergies  Allergen Reactions  . Amoxicillin-Pot Clavulanate Hives and Diarrhea    Has patient had a PCN reaction causing immediate rash, facial/tongue/throat swelling, SOB or lightheadedness with hypotension: yes Has patient had a PCN reaction causing severe rash involving mucus membranes or skin necrosis: yes Has patient had a PCN reaction that required hospitalization : unknown Has patient had a PCN reaction occurring within the last 10 years: yes If all of the above answers are "NO", then may proceed with Cephalosporin use.   Marland Kitchen Lisinopril-Hydrochlorothiazide Other (See Comments)    Tired- no energy to do anything..  . Sulfonamide Derivatives Hives    Chief Complaint  Patient presents with  . New Admit To SNF     HPI:  Patient is a 83 y.o. female seen today for short term rehabilitation post hospital admission from 02/16/17-2/21/18Post mechanical fall with closed displaced intertrochanteric fracture of left femur. She underwent IM nailing 02/17/17. She is seen in her room today. She has medical history of chronic pain syndrome, hypertension, osteoporosis among others.  Review of Systems:  Constitutional: Negative for fever, chills, diaphoresis. energy level is improved some.  HENT: Negative for  headache, congestion, nasal discharge, difficulty swallowing.   Eyes: Negative for blurred vision, double vision and discharge.  Respiratory: Negative for cough, shortness of breath and wheezing.   Cardiovascular: Negative for chest pain, palpitations, leg swelling.  Gastrointestinal: Negative for heartburn, nausea, vomiting, abdominal pain. Genitourinary: Negative for dysuria.  Musculoskeletal: Negative for back pain, fall. has ongoing pain to her back and leg.  Skin: Negative for itching, rash.  Neurological: Negative for dizziness. Psychiatric/Behavioral: Negative for depression   Past Medical History:  Diagnosis Date  . Chronic back pain    with degenerative joint disease  . Chronic pain disorder    with narcotic management  . Dry eyes   . Hypertension   . Osteoporosis   . PE (pulmonary embolism)    After hysterectomy in 1967  . Pneumonia    Past Surgical History:  Procedure Laterality Date  . ABDOMINAL HYSTERECTOMY    . APPENDECTOMY    . BREAST ENHANCEMENT SURGERY    . FEMUR IM NAIL Left 02/17/2017   Procedure: INTRAMEDULLARY (IM) NAIL FEMORAL;  Surgeon: Nicholes Stairs, MD;  Location: [facility];  Service: Orthopedics;  Laterality: Left;  . HIP ARTHROPLASTY Right 12/24/2015   Procedure: ARTHROPLASTY BIPOLAR HIP (HEMIARTHROPLASTY);  Surgeon: Netta Cedars, MD;  Location: [facility];  Service: Orthopedics;  Laterality: Right;  . TONSILLECTOMY     Social History:   reports that she quit smoking about 40 years ago. She quit after 13.00 years of use. She has never used smokeless tobacco. She reports that she does not drink alcohol or use drugs.  Family History  Problem Relation Age of Onset  . Heart Problems Mother  Medications: Allergies as of 03/05/2017      Reactions   Amoxicillin-pot Clavulanate Hives, Diarrhea   Has patient had a PCN reaction causing immediate rash, facial/tongue/throat swelling, SOB or lightheadedness with hypotension: yes Has patient had a PCN  reaction causing severe rash involving mucus membranes or skin necrosis: yes Has patient had a PCN reaction that required hospitalization : unknown Has patient had a PCN reaction occurring within the last 10 years: yes If all of the above answers are "NO", then may proceed with Cephalosporin use.   Lisinopril-hydrochlorothiazide Other (See Comments)   Tired- no energy to do anything..   Sulfonamide Derivatives Hives      Medication List       Accurate as of 03/05/17  4:33 PM. Always use your most recent med list.          acetaminophen 500 MG tablet Commonly known as:  TYLENOL Take 500 mg by mouth every 6 (six) hours as needed for moderate pain. x10 days   ALPRAZolam 0.5 MG tablet Commonly known as:  XANAX Take 0.5 mg by mouth daily as needed for anxiety or sleep.   aspirin EC 81 MG tablet Take 1 tablet (81 mg total) by mouth 2 (two) times daily.   escitalopram 20 MG tablet Commonly known as:  LEXAPRO Take 20 mg by mouth daily.   fentaNYL 25 MCG/HR patch Commonly known as:  DURAGESIC - dosed mcg/hr Place 1 patch (25 mcg total) onto the skin every 3 (three) days.   fluorometholone 0.1 % ophthalmic suspension Commonly known as:  FML Place 1 drop into both eyes daily.   methocarbamol 500 MG tablet Commonly known as:  ROBAXIN Take 500 mg by mouth every 8 (eight) hours.   multivitamin tablet Take 1 tablet by mouth daily.   oxycodone 30 MG immediate release tablet Commonly known as:  ROXICODONE Take 1 tablet (30 mg total) by mouth every 6 (six) hours as needed (severe pain).   sennosides-docusate sodium 8.6-50 MG tablet Commonly known as:  SENOKOT-S Take 2 tablets by mouth 2 (two) times daily.   traZODone 50 MG tablet Commonly known as:  DESYREL Take 50 mg by mouth at bedtime as needed for sleep.       Immunizations: Immunization History  Administered Date(s) Administered  . Influenza Split 10/01/2011, 09/29/2012  . Influenza Whole 09/29/2008, 09/27/2010  .  Influenza, High Dose Seasonal PF 10/11/2014, 09/29/2015, 09/25/2016  . Influenza-Unspecified 10/01/2011, 09/29/2012, 09/30/2015  . Pneumococcal Polysaccharide-23 12/30/1998, 01/02/2004  . Td 08/02/2008     Physical Exam: Vitals:   03/05/17 1316  BP: 120/64  Pulse: 71  Resp: 20  Temp: 98.6 F (37 C)  TempSrc: Oral  SpO2: 98%  Weight: 122 lb 9.6 oz (55.6 kg)  Height: 5' (1.524 m)   Body mass index is 23.94 kg/m.  General- elderly female, well built, in no acute distress Head- normocephalic, atraumatic Nose- no nasal discharge Throat- moist mucus membrane Eyes- PERRLA, EOMI, no pallor, no icterus, no discharge, normal conjunctiva, normal sclera Neck- no cervical lymphadenopathy Cardiovascular- normal s1,s2, no murmur Respiratory- bilateral clear to auscultation, no wheeze, no rhonchi, no crackles, no use of accessory muscles Abdomen- bowel sounds present, soft, non tender Musculoskeletal- able to move all 4 extremities, limited ROM with left leg from pain Neurological- alert and oriented to person, place and time Skin- warm and dry, 3 surgical incision to left leg with dressing clean and dry Psychiatry- normal mood and affect    Labs reviewed: Basic Metabolic Panel:  Recent Labs  02/16/17 1454 02/16/17 1508 02/17/17 1631 02/18/17 0526 02/24/17  NA 140 141  --  136 138  K 3.2* 3.3*  --  4.4 4.0  CL 105 104  --  100*  --   CO2 26  --   --  28  --   GLUCOSE 132* 134*  --  147*  --   BUN 18 17  --  19 15  CREATININE 0.76 0.80 0.67 0.93 0.7  CALCIUM 9.6  --   --  9.0  --     CBC:  Recent Labs  02/16/17 1454  02/17/17 1631 02/18/17 0526 02/19/17 0400 02/24/17  WBC 15.0*  --  13.1* 16.0* 11.2* 10.2  NEUTROABS 10.6*  --   --   --   --  7  HGB 12.5  < > 10.1* 9.2* 8.2* 9.0*  HCT 37.9  < > 31.2* 27.9* 25.1* 29*  MCV 85.6  --  86.7 86.4 86.6  --   PLT 300  --  200 197 167 350  < > = values in this interval not displayed. Cardiac Enzymes: No results for  input(s): CKTOTAL, CKMB, CKMBINDEX, TROPONINI in the last 8760 hours. BNP: Invalid input(s): POCBNP CBG:  Recent Labs  05/08/16 1514  GLUCAP 70    Radiological Exams: Dg Chest 2 View  Result Date: 02/16/2017 CLINICAL DATA:  Pain following fall EXAM: CHEST  2 VIEW COMPARISON:  August 02, 2016 FINDINGS: There is no edema or consolidation. Heart size and pulmonary vascularity are normal. No adenopathy. There is atherosclerotic calcification in the aorta. No pneumothorax. There is an old healed fracture proximal left humerus with remodeling. There is thoracolumbar levoscoliosis. IMPRESSION: No edema or consolidation. Aortic atherosclerosis. Old healed fracture with remodeling left proximal humerus. Marked scoliosis. Electronically Signed   By: Lowella Grip III M.D.   On: 02/16/2017 14:56   Ct Head Wo Contrast  Result Date: 02/16/2017 CLINICAL DATA:  83 year old female with history of trauma after a fall in her kitchen. Injury to the head. No loss of consciousness. EXAM: CT HEAD WITHOUT CONTRAST TECHNIQUE: Contiguous axial images were obtained from the base of the skull through the vertex without intravenous contrast. COMPARISON:  Head CT 04/19/2015. FINDINGS: Brain: Mild cerebral atrophy. Patchy and confluent areas of decreased attenuation are noted throughout the deep and periventricular white matter of the cerebral hemispheres bilaterally, compatible with chronic microvascular ischemic disease. Well-defined area of low attenuation in the right cerebellar hemisphere, new compared to the prior examination, but most compatible with an area of encephalomalacia from old infarct. No evidence of acute infarction, hemorrhage, hydrocephalus, extra-axial collection or mass lesion/mass effect. Vascular: Cerebrovascular atherosclerosis without acute abnormalities. Skull: Normal. Negative for fracture or focal lesion. Sinuses/Orbits: No acute finding. Other: None. IMPRESSION: 1. No evidence of significant  acute traumatic injury to the skull or brain. 2. No acute intracranial abnormalities. 3. Mild cerebral atrophy with chronic microvascular ischemic changes and evidence of remote right cerebellar infarct, as above. 4. Cerebrovascular atherosclerosis. Electronically Signed   By: Vinnie Langton M.D.   On: 02/16/2017 15:03   Dg C-arm 1-60 Min  Result Date: 02/17/2017 CLINICAL DATA:  Left femur ORIF EXAM: LEFT FEMUR 2 VIEWS; DG C-ARM 61-120 MIN COMPARISON:  Yesterday FINDINGS: Intraoperative fluoroscopy shows placement of a femoral nail with dynamic hip screw. There is anatomic alignment at the patient's intertrochanteric femur fracture. No evidence of intraoperative fracture. IMPRESSION: Fluoroscopy for intertrochanteric femur fracture ORIF. No unexpected finding. Electronically Signed   By:  Monte Fantasia M.D.   On: 02/17/2017 14:29   Dg Hip Unilat W Or Wo Pelvis 2-3 Views Left  Result Date: 02/16/2017 CLINICAL DATA:  Pain following fall EXAM: DG HIP (WITH OR WITHOUT PELVIS) 2-3V LEFT COMPARISON:  None. FINDINGS: Frontal pelvis as well as frontal and lateral left hip images were obtained. There is an intertrochanteric femur fracture on the left with medial angulation distally. There is a total hip prosthesis on the right which appears well seated. No dislocation. There is moderate narrowing of the left hip joint. There is lower lumbar levoscoliosis. There is atherosclerotic calcification in the distal aorta and right common iliac artery. IMPRESSION: Intertrochanteric femur fracture on the left with medial angulation distally. No dislocation. Total hip prosthesis on the right, well-seated. Narrowing left hip joint. Aortoiliac atherosclerosis. Lower lumbar levoscoliosis. Electronically Signed   By: Lowella Grip III M.D.   On: 02/16/2017 14:58   Dg Femur Min 2 Views Left  Result Date: 02/17/2017 CLINICAL DATA:  Left femur ORIF EXAM: LEFT FEMUR 2 VIEWS; DG C-ARM 61-120 MIN COMPARISON:  Yesterday  FINDINGS: Intraoperative fluoroscopy shows placement of a femoral nail with dynamic hip screw. There is anatomic alignment at the patient's intertrochanteric femur fracture. No evidence of intraoperative fracture. IMPRESSION: Fluoroscopy for intertrochanteric femur fracture ORIF. No unexpected finding. Electronically Signed   By: Monte Fantasia M.D.   On: 02/17/2017 14:29    Assessment/Plan  Unsteady gait Will have patient work with PT/OT as tolerated to regain strength and restore function.  Fall precautions are in place.  Left femur intertrochanteric fracture S/p IM nail. F/u with orthopedic. She is being followed by [facility]. Continue Roxicodone 30 mg every 6 hours as needed, Robaxin 500 mg every 8 hours as needed for muscle spasm and fentanyl patch 25 mg every 72 hours.Will have her work with physical therapy and occupational therapy team to help with gait training and muscle strengthening exercises.fall precautions. Skin care. Encourage to be out of bed. Continue aspirin 81 mg twice a day for DVT prophylaxis  Constipation Continue senna 2 tablets twice a day, hydration encouraged, monitor bowel movement  Insomnia Has been sleeping fair, on trazodone and Xanax on a needed basis to assist with sleep, monitor for falls  Chronic depression Continue Lexapro 20 mg daily and monitor. Continue Xanax 0.5 milligrams daily as needed  Chronic pain Followed by PMR. Continue current pain regimen  As above.  Goals of care: short term rehabilitation   Labs/tests ordered: cbc, bmp  Family/ staff Communication: reviewed care plan with patient and nursing supervisor    Blanchie Serve, MD Internal Medicine [place], [place] [address] Cell Phone (Monday-Friday 8 am - 5 pm): [phone] On Call: [phone] and follow prompts after 5 pm and on weekends Office Phone: [phone] Office Fax: [phone]

## 2017-03-05 NOTE — Progress Notes (Signed)
This encounter was created in error - please disregard.

## 2017-03-06 ENCOUNTER — Encounter: Payer: Self-pay | Admitting: Adult Health

## 2017-03-06 ENCOUNTER — Non-Acute Institutional Stay (SKILLED_NURSING_FACILITY): Payer: Medicare Other | Admitting: Adult Health

## 2017-03-06 DIAGNOSIS — K5900 Constipation, unspecified: Secondary | ICD-10-CM

## 2017-03-06 DIAGNOSIS — G4700 Insomnia, unspecified: Secondary | ICD-10-CM | POA: Diagnosis not present

## 2017-03-06 DIAGNOSIS — F329 Major depressive disorder, single episode, unspecified: Secondary | ICD-10-CM | POA: Diagnosis not present

## 2017-03-06 DIAGNOSIS — F419 Anxiety disorder, unspecified: Secondary | ICD-10-CM

## 2017-03-06 DIAGNOSIS — D62 Acute posthemorrhagic anemia: Secondary | ICD-10-CM

## 2017-03-06 DIAGNOSIS — R2681 Unsteadiness on feet: Secondary | ICD-10-CM | POA: Diagnosis not present

## 2017-03-06 DIAGNOSIS — F32A Depression, unspecified: Secondary | ICD-10-CM

## 2017-03-06 DIAGNOSIS — G8928 Other chronic postprocedural pain: Secondary | ICD-10-CM | POA: Diagnosis not present

## 2017-03-06 DIAGNOSIS — S72142S Displaced intertrochanteric fracture of left femur, sequela: Secondary | ICD-10-CM | POA: Diagnosis not present

## 2017-03-06 NOTE — Progress Notes (Signed)
DATE:  03/06/2017   MRN:  [id number]  BIRTHDAY: 1932-10-04  Facility:  [facility]urs[facility] Home Location:  [place] Room Number: 106-P  LEVEL OF CARE:  SNF (31)  Contact Information    Name Relation Home Work [place] [phone]     No name specified           Code Status History    Date Active Date Inactive Code Status Order ID Comments User Context   02/16/2017  6:35 PM 02/19/2017  4:33 PM DNR [id number]  Gwynne Edinger, MD Inpatient   12/25/2015  1:11 AM 12/27/2015  7:17 PM Full Code [id number]  Edwin Dada, MD Inpatient    Questions for Most Recent Historical Code Status (Order [id number])    Question Answer Comment   In the event of cardiac or respiratory ARREST Do not call a "code blue"    In the event of cardiac or respiratory ARREST Do not perform Intubation, CPR, defibrillation or ACLS    In the event of cardiac or respiratory ARREST Use medication by any route, position, wound care, and other measures to relive pain and suffering. May use oxygen, suction and manual treatment of airway obstruction as needed for comfort.        Chief Complaint  Patient presents with  . Discharge Note    HISTORY OF PRESENT ILLNESS:  This is an 83-YO female seen for a discharge visit.  She will discharge to home on on 03/07/2017 with home health PT, OT, CNA, Nursing and Social Work services.    She was admitted to [facility] for a short-term rehabilitation on 02/19/2017 following an admission at [facility] 02/16/2017-02/19/2017 for left hip IM nailing, S/P  mechanical fall at home wherein she sustained a left hip fracture.    She was seen in the room today and was noted to stand up while holding her walker independently.  Patient was admitted to this facility for short-term rehabilitation after the patient's recent hospitalization.  Patient has completed SNF rehabilitation and therapy has cleared the patient for  discharge.   PAST ME at bedsideDICAL HISTORY:  Past Medical History:  Diagnosis Date  . Chronic back pain    with degenerative joint disease  . Chronic pain disorder    with narcotic management  . Dry eyes   . Hypertension   . Osteoporosis   . PE (pulmonary embolism)    After hysterectomy in 1967  . Pneumonia      CURRENT MEDICATIONS: Reviewed  Patient's Medications  New Prescriptions   No medications on file  Previous Medications   ACETAMINOPHEN (TYLENOL) 500 MG TABLET    Take 500 mg by mouth every 6 (six) hours as needed for moderate pain. x10 days   ALPRAZOLAM (XANAX) 0.5 MG TABLET    Take 0.5 mg by mouth daily as needed for anxiety or sleep.   ASPIRIN EC 81 MG TABLET    Take 1 tablet (81 mg total) by mouth 2 (two) times daily.   ESCITALOPRAM (LEXAPRO) 20 MG TABLET    Take 20 mg by mouth daily.    FENTANYL (DURAGESIC - DOSED MCG/HR) 25 MCG/HR PATCH    Place 1 patch (25 mcg total) onto the skin every 3 (three) days.   FLUOROMETHOLONE (FML) 0.1 % OPHTHALMIC SUSPENSION    Place 1 drop into both eyes daily.    METHOCARBAMOL (ROBAXIN) 500 MG TABLET    Take 500 mg by  mouth every 8 (eight) hours.   MULTIPLE VITAMIN (MULTIVITAMIN) TABLET    Take 1 tablet by mouth daily.   OXYCODONE (ROXICODONE) 30 MG IMMEDIATE RELEASE TABLET    Take 1 tablet (30 mg total) by mouth every 6 (six) hours as needed (severe pain).   SENNOSIDES-DOCUSATE SODIUM (SENOKOT-S) 8.6-50 MG TABLET    Take 2 tablets by mouth 2 (two) times daily.   TRAZODONE (DESYREL) 50 MG TABLET    Take 50 mg by mouth at bedtime as needed for sleep.  Modified Medications   No medications on file  Discontinued Medications   No medications on file     Allergies  Allergen Reactions  . Amoxicillin-Pot Clavulanate Hives and Diarrhea    Has patient had a PCN reaction causing immediate rash, facial/tongue/throat swelling, SOB or lightheadedness with hypotension: yes Has patient had a PCN reaction causing severe rash involving mucus  membranes or skin necrosis: yes Has patient had a PCN reaction that required hospitalization : unknown Has patient had a PCN reaction occurring within the last 10 years: yes If all of the above answers are "NO", then may proceed with Cephalosporin use.   Marland Kitchen Lisinopril-Hydrochlorothiazide Other (See Comments)    Tired- no energy to do anything..  . Sulfonamide Derivatives Hives     REVIEW OF SYSTEMS:  GENERAL: no change in appetite, no fatigue, no weight changes, no fever, chills or weakness EYES: Denies change in vision, dry eyes, eye pain, itching or discharge EARS: Denies change in hearing, ringing in ears, or earache NOSE: Denies nasal congestion or epistaxis MOUTH and THROAT: Denies oral discomfort, gingival pain or bleeding, pain from teeth or hoarseness   RESPIRATORY: no cough, SOB, DOE, wheezing, hemoptysis CARDIAC: no chest pain, edema or palpitations GI: no abdominal pain, diarrhea, constipation, heart burn, nausea or vomiting GU: Denies dysuria, frequency, hematuria, incontinence, or discharge PSYCHIATRIC: Denies feeling of depression or anxiety. No report of hallucinations, insomnia, paranoia, or agitation    PHYSICAL EXAMINATION  GENERAL APPEARANCE: Well nourished. In no acute distress. Normal body habitus SKIN:  Left thigh surgical sites are healed HEAD: Normal in size and contour. No evidence of trauma EYES: Lids open and close normally. No blepharitis, entropion or ectropion. PERRL. Conjunctivae are clear and sclerae are white. Lenses are without opacity EARS: Pinnae are normal.hard of hearing MOUTH and THROAT: Lips are without lesions. Oral mucosa is moist and without lesions. Tongue is normal in shape, size, and color and without lesions NECK: supple, trachea midline, no neck masses, no thyroid tenderness, no thyromegaly LYMPHATICS: no LAN in the neck, no supraclavicular LAN RESPIRATORY: breathing is even & unlabored, BS CTAB CARDIAC: RRR, no murmur,no extra heart  sounds, LLE 1+ edema GI: abdomen soft, normal BS, no masses, no tenderness, no hepatomegaly, no splenomegaly EXTREMITIES:  Able to move X 4 extremities PSYCHIATRIC: Alert and oriented X 3. Affect and behavior are appropriate   LABS/RADIOLOGY: Labs reviewed: Basic Metabolic Panel:  Recent Labs  02/16/17 1454 02/16/17 1508 02/17/17 1631 02/18/17 0526 02/24/17  NA 140 141  --  136 138  K 3.2* 3.3*  --  4.4 4.0  CL 105 104  --  100*  --   CO2 26  --   --  28  --   GLUCOSE 132* 134*  --  147*  --   BUN 18 17  --  19 15  CREATININE 0.76 0.80 0.67 0.93 0.7  CALCIUM 9.6  --   --  9.0  --  CBC:  Recent Labs  02/16/17 1454  02/17/17 1631 02/18/17 0526 02/19/17 0400 02/24/17  WBC 15.0*  --  13.1* 16.0* 11.2* 10.2  NEUTROABS 10.6*  --   --   --   --  7  HGB 12.5  < > 10.1* 9.2* 8.2* 9.0*  HCT 37.9  < > 31.2* 27.9* 25.1* 29*  MCV 85.6  --  86.7 86.4 86.6  --   PLT 300  --  200 197 167 350  < > = values in this interval not displayed. CBG:  Recent Labs  05/08/16 1514  GLUCAP 70      Dg Chest 2 View  Result Date: 02/16/2017 CLINICAL DATA:  Pain following fall EXAM: CHEST  2 VIEW COMPARISON:  August 02, 2016 FINDINGS: There is no edema or consolidation. Heart size and pulmonary vascularity are normal. No adenopathy. There is atherosclerotic calcification in the aorta. No pneumothorax. There is an old healed fracture proximal left humerus with remodeling. There is thoracolumbar levoscoliosis. IMPRESSION: No edema or consolidation. Aortic atherosclerosis. Old healed fracture with remodeling left proximal humerus. Marked scoliosis. Electronically Signed   By: Lowella Grip III M.D.   On: 02/16/2017 14:56   Ct Head Wo Contrast  Result Date: 02/16/2017 CLINICAL DATA:  83 year old female with history of trauma after a fall in her kitchen. Injury to the head. No loss of consciousness. EXAM: CT HEAD WITHOUT CONTRAST TECHNIQUE: Contiguous axial images were obtained from the base  of the skull through the vertex without intravenous contrast. COMPARISON:  Head CT 04/19/2015. FINDINGS: Brain: Mild cerebral atrophy. Patchy and confluent areas of decreased attenuation are noted throughout the deep and periventricular white matter of the cerebral hemispheres bilaterally, compatible with chronic microvascular ischemic disease. Well-defined area of low attenuation in the right cerebellar hemisphere, new compared to the prior examination, but most compatible with an area of encephalomalacia from old infarct. No evidence of acute infarction, hemorrhage, hydrocephalus, extra-axial collection or mass lesion/mass effect. Vascular: Cerebrovascular atherosclerosis without acute abnormalities. Skull: Normal. Negative for fracture or focal lesion. Sinuses/Orbits: No acute finding. Other: None. IMPRESSION: 1. No evidence of significant acute traumatic injury to the skull or brain. 2. No acute intracranial abnormalities. 3. Mild cerebral atrophy with chronic microvascular ischemic changes and evidence of remote right cerebellar infarct, as above. 4. Cerebrovascular atherosclerosis. Electronically Signed   By: Vinnie Langton M.D.   On: 02/16/2017 15:03   Dg C-arm 1-60 Min  Result Date: 02/17/2017 CLINICAL DATA:  Left femur ORIF EXAM: LEFT FEMUR 2 VIEWS; DG C-ARM 61-120 MIN COMPARISON:  Yesterday FINDINGS: Intraoperative fluoroscopy shows placement of a femoral nail with dynamic hip screw. There is anatomic alignment at the patient's intertrochanteric femur fracture. No evidence of intraoperative fracture. IMPRESSION: Fluoroscopy for intertrochanteric femur fracture ORIF. No unexpected finding. Electronically Signed   By: Monte Fantasia M.D.   On: 02/17/2017 14:29   Dg Hip Unilat W Or Wo Pelvis 2-3 Views Left  Result Date: 02/16/2017 CLINICAL DATA:  Pain following fall EXAM: DG HIP (WITH OR WITHOUT PELVIS) 2-3V LEFT COMPARISON:  None. FINDINGS: Frontal pelvis as well as frontal and lateral left hip  images were obtained. There is an intertrochanteric femur fracture on the left with medial angulation distally. There is a total hip prosthesis on the right which appears well seated. No dislocation. There is moderate narrowing of the left hip joint. There is lower lumbar levoscoliosis. There is atherosclerotic calcification in the distal aorta and right common iliac artery. IMPRESSION: Intertrochanteric femur fracture  on the left with medial angulation distally. No dislocation. Total hip prosthesis on the right, well-seated. Narrowing left hip joint. Aortoiliac atherosclerosis. Lower lumbar levoscoliosis. Electronically Signed   By: Lowella Grip III M.D.   On: 02/16/2017 14:58   Dg Femur Min 2 Views Left  Result Date: 02/17/2017 CLINICAL DATA:  Left femur ORIF EXAM: LEFT FEMUR 2 VIEWS; DG C-ARM 61-120 MIN COMPARISON:  Yesterday FINDINGS: Intraoperative fluoroscopy shows placement of a femoral nail with dynamic hip screw. There is anatomic alignment at the patient's intertrochanteric femur fracture. No evidence of intraoperative fracture. IMPRESSION: Fluoroscopy for intertrochanteric femur fracture ORIF. No unexpected finding. Electronically Signed   By: Monte Fantasia M.D.   On: 02/17/2017 14:29    ASSESSMENT/PLAN:  Unsteady gait - for Home health PT and OT, for therapeutic strengthening exercises; fall precautions   Closed displaced intertrochanteric fracture of left femur S/P IM nail -  for Home health PT and OT, for therapeutic strengthening exercises; continue aspirin EC 81 mg 1 tab by mouth twice a day for DVT prophylaxis; oxycodone 30 mg IR 1 tab by mouth every 6 hours when necessary and fentanyl 25 g/hour 1 patch to skin every 3 days for pain; Robaxin 500 mg 1 tab by mouth every 8 hours when necessary for muscle.; Follow-up with Dr. Nicholes Stairs, orthopedic surgeon  Chronic depression  - continue Lexapro 20 mg 1 tab by mouth daily   Insomnia - continue trazodone 50 mg 1 tab by  mouth daily at bedtime when necessary  Anxiety - mood is stable; continue Xanax 0.5 mg 1 tab by mouth daily when necessary  Constipation -  continue senna S 2 tabs by mouth twice a day   Anemia, acute blood loss - improved Lab Results  Component Value Date   HGB 9.0 (A) 02/24/2017   Chronic pain - she is being followed up in a pain clinic; continue oxycodone 30 mg 1 tab by mouth every 6 hours when necessary    I have filled out patient's discharge paperwork and written prescriptions.  Patient will receive home health PT, OT, SW, Nursing and CNA.  DME provided:  None  Total discharge time: Greater than 30 minutes Greater than 50% was spent in counseling and coordination of care with the patient.   Discharge time involved coordination of the discharge process with social worker, nursing staff and therapy department. Medical justification for home health services verified.   Monina C. Medina-Vargas - NP    [employer] [facility] [phone]

## 2017-03-31 IMAGING — US US EXTREM LOW VENOUS BILAT
1 series · 13 of 24 positions shown · non-contrast
Comparison: None.

CLINICAL DATA: Bilateral lower extremity edema

EXAM:
BILATERAL LOWER EXTREMITY VENOUS DUPLEX ULTRASOUND
TECHNIQUE: Gray-scale sonography with graded compression, as well as color
Doppler and duplex ultrasound were performed to evaluate the lower
extremity deep venous systems from the level of the common femoral
vein and including the common femoral, femoral, profunda femoral,
popliteal and calf veins including the posterior tibial, peroneal
and gastrocnemius veins when visible. The superficial great
saphenous vein was also interrogated. Spectral Doppler was utilized
to evaluate flow at rest and with distal augmentation maneuvers in
the common femoral, femoral and popliteal veins.

[Series 1: us extrem low venous bilat · 13 of 53 slices shown]
[im 1/53]
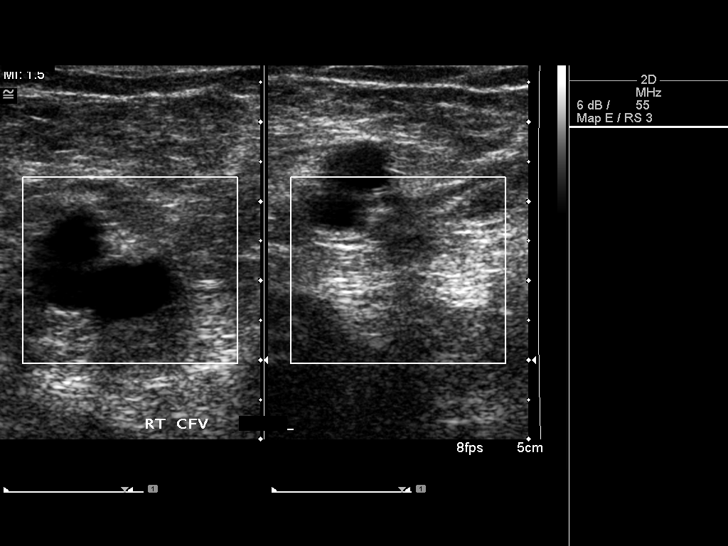
[im 5/53]
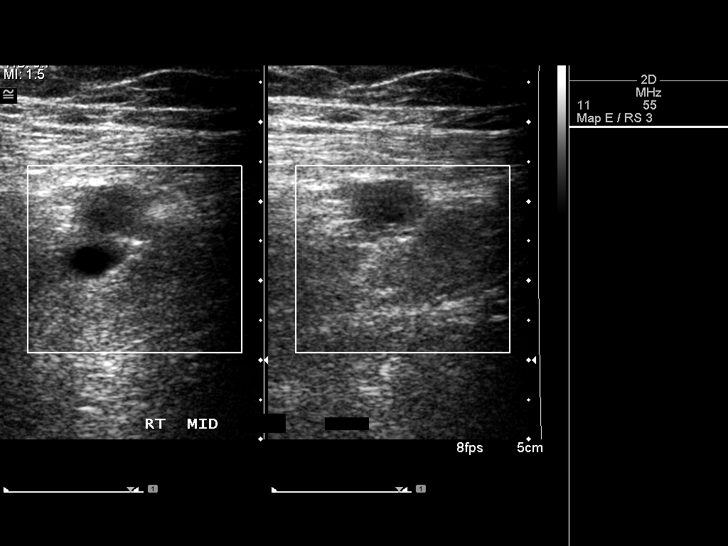
[im 10/53]
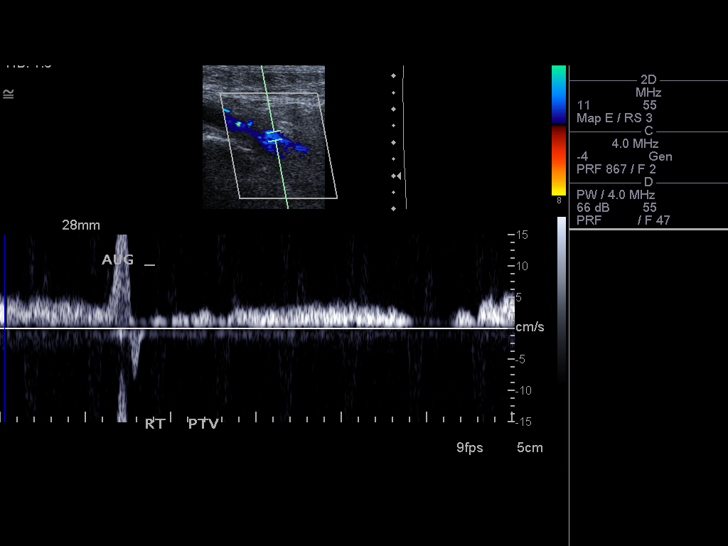
[im 14/53]
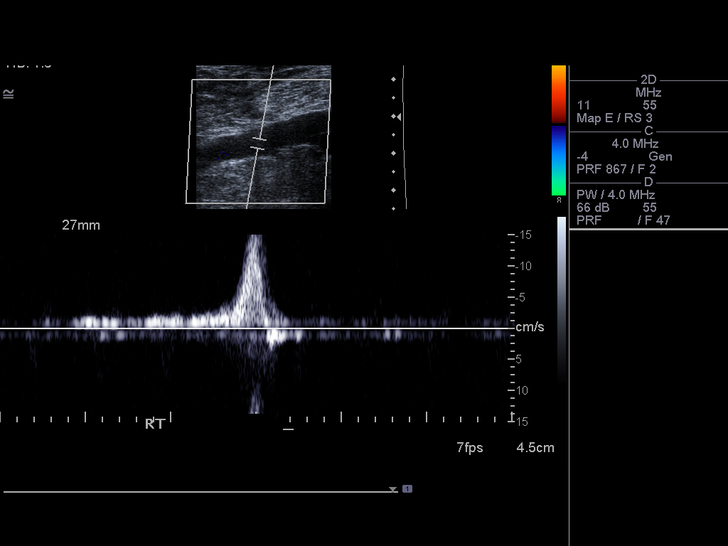
[im 19/53]
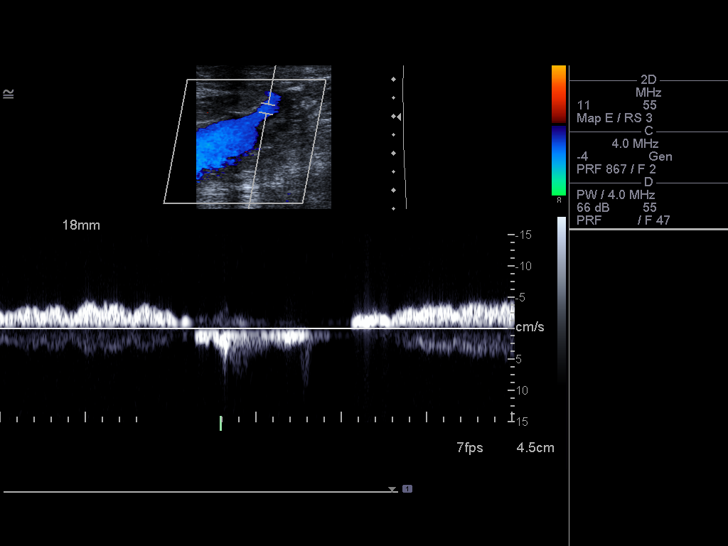
[im 23/53]
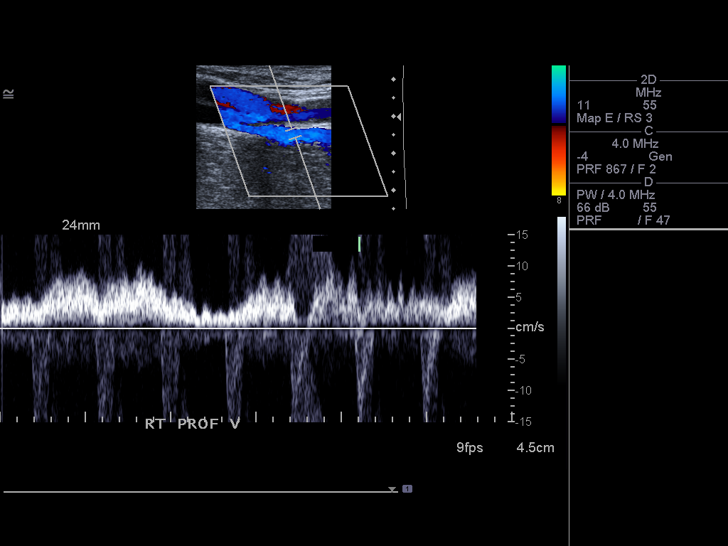
[im 28/53]
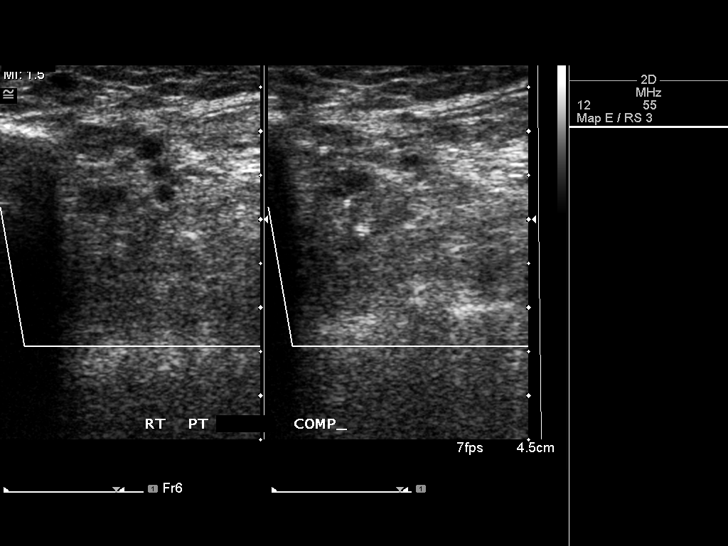
[im 30/53]
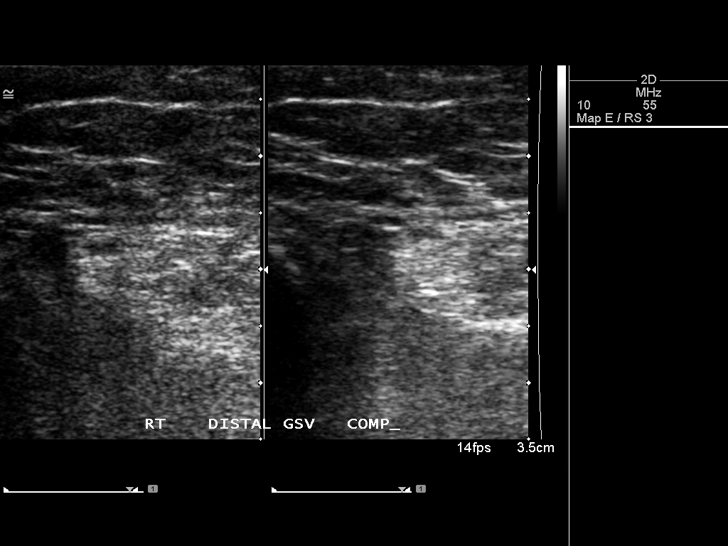
[im 34/53]
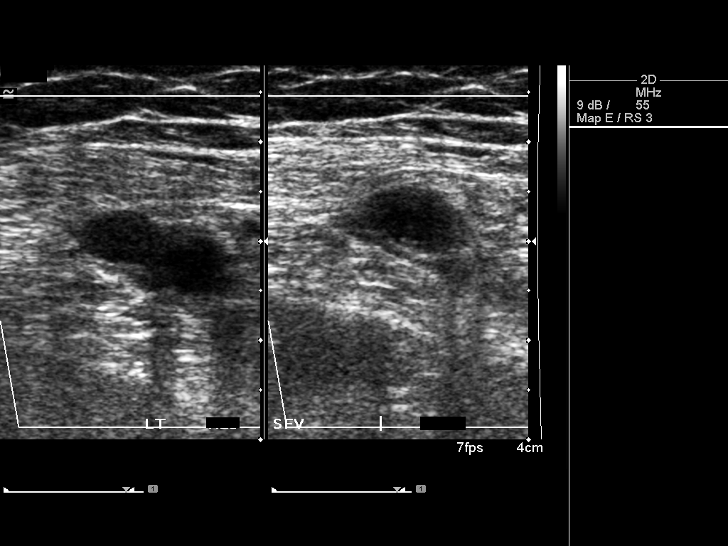
[im 39/53]
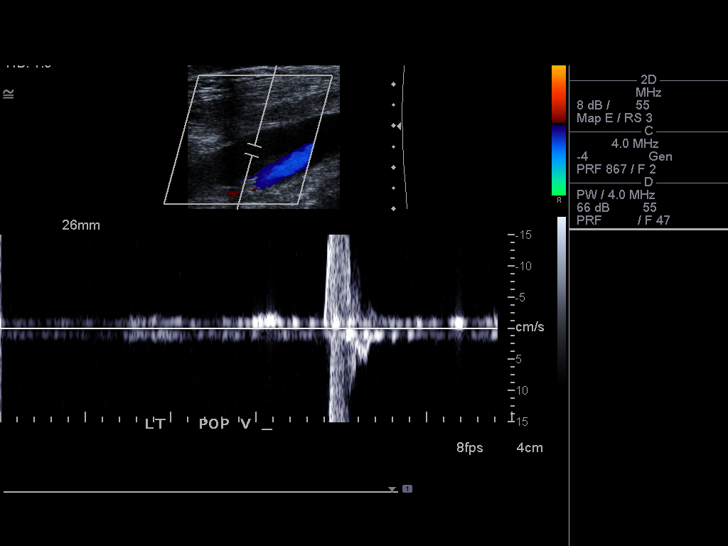
[im 43/53]
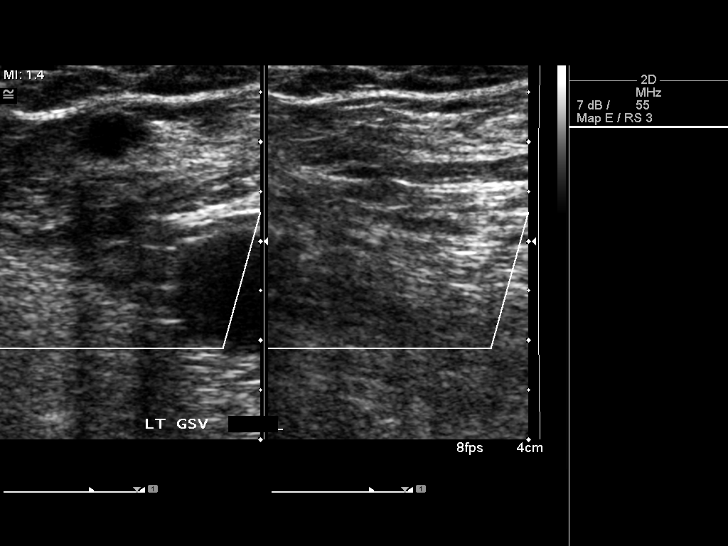
[im 48/53]
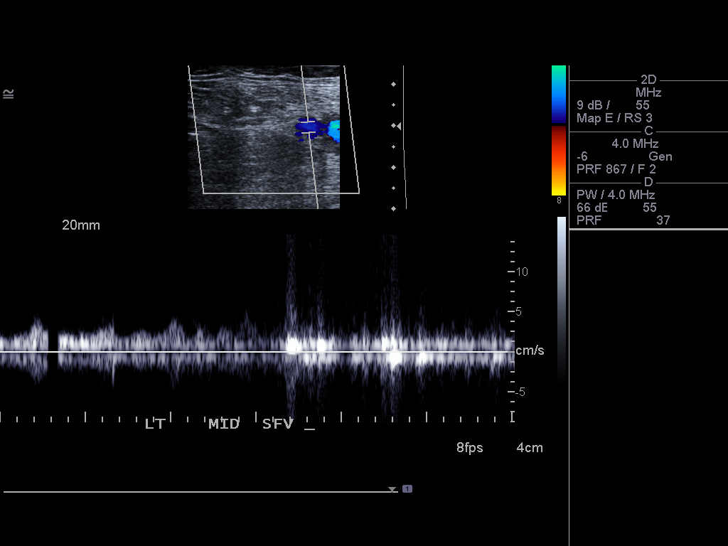
[im 53/53]
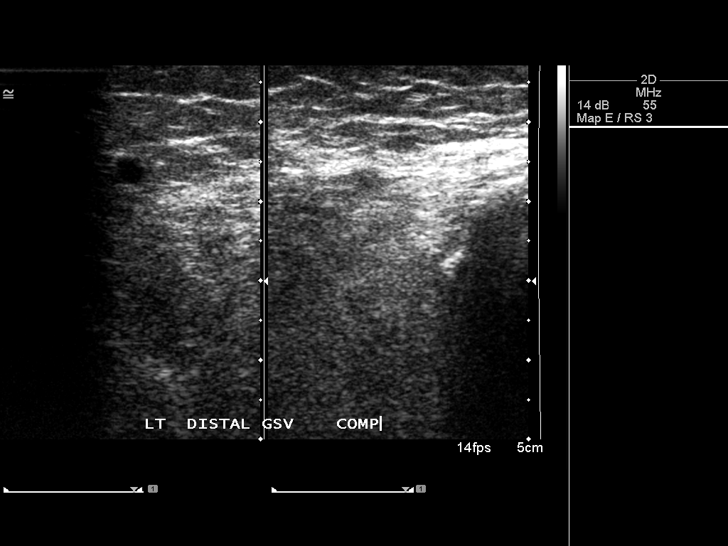

[13 of 24 positions shown; findings below may reference images not displayed]

FINDINGS: RIGHT LOWER EXTREMITY

Common Femoral Vein: No evidence of thrombus. Normal
compressibility, respiratory phasicity and response to augmentation.

Saphenofemoral Junction: No evidence of thrombus. Normal
compressibility and flow on color Doppler imaging.

Profunda Femoral Vein: No evidence of thrombus. Normal
compressibility and flow on color Doppler imaging.

Femoral Vein: No evidence of thrombus. Normal compressibility,
respiratory phasicity and response to augmentation.

Popliteal Vein: No evidence of thrombus. Normal compressibility,
respiratory phasicity and response to augmentation.

Calf Veins: No evidence of thrombus. Normal compressibility and flow
on color Doppler imaging.

Superficial Great Saphenous Vein: No evidence of thrombus. Normal
compressibility and flow on color Doppler imaging.

Venous Reflux:  None.

Other Findings:  None.

LEFT LOWER EXTREMITY

Common Femoral Vein: No evidence of thrombus. Normal
compressibility, respiratory phasicity and response to augmentation.

Saphenofemoral Junction: No evidence of thrombus. Normal
compressibility and flow on color Doppler imaging.

Profunda Femoral Vein: No evidence of thrombus. Normal
compressibility and flow on color Doppler imaging.

Femoral Vein: No evidence of thrombus. Normal compressibility,
respiratory phasicity and response to augmentation.

Popliteal Vein: No evidence of thrombus. Normal compressibility,
respiratory phasicity and response to augmentation.

Calf Veins: No evidence of thrombus. Normal compressibility and flow
on color Doppler imaging.

Superficial Great Saphenous Vein: No evidence of thrombus. Normal
compressibility and flow on color Doppler imaging.

Venous Reflux:  None.

Other Findings:  None.
IMPRESSION: No evidence of deep venous thrombosis in either lower extremity.

## 2017-05-10 IMAGING — CT CT PELVIS W/O CM
2 of 3 series · 12 of 36 positions shown, 19 images · non-contrast
Comparison: Pelvis film of 12/25/2015 and CT abdomen pelvis of
01/18/2014

CLINICAL DATA: Recent right total hip replacement after displaced
fracture of the right femoral neck, now with pain

EXAM:
CT PELVIS WITHOUT CONTRAST
TECHNIQUE: Multidetector CT imaging of the pelvis was performed following the
standard protocol without intravenous contrast.

[Series 4: pelvis soft · axial · 0.73mm/px · z∈[-241,+16]mm · 11 of 123 slices shown, 17 images]
[im 10/123  soft-tissue]
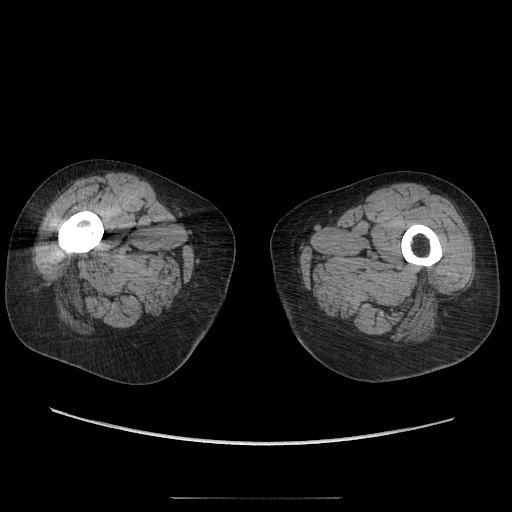
[im 10/123  bone]
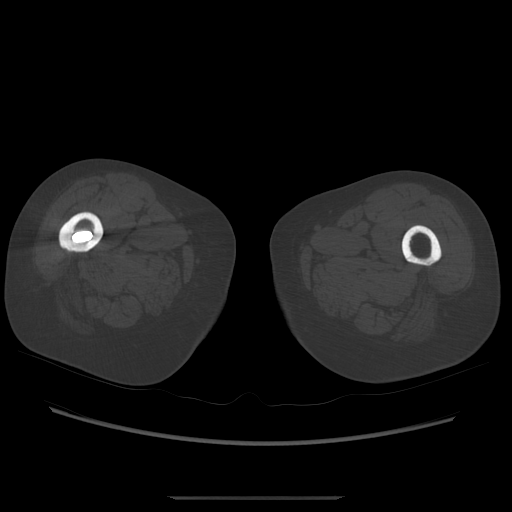
[im 19/123  soft-tissue]
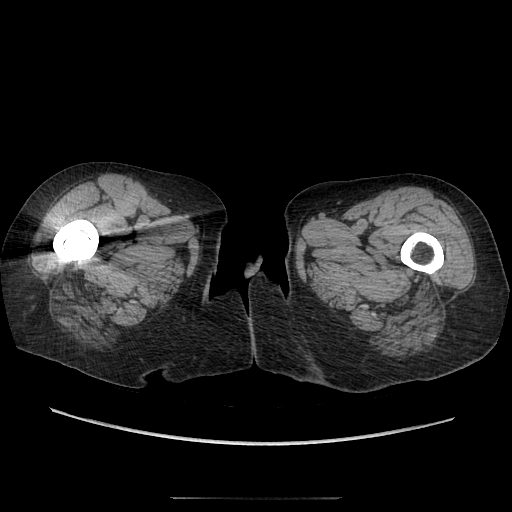
[im 29/123  soft-tissue]
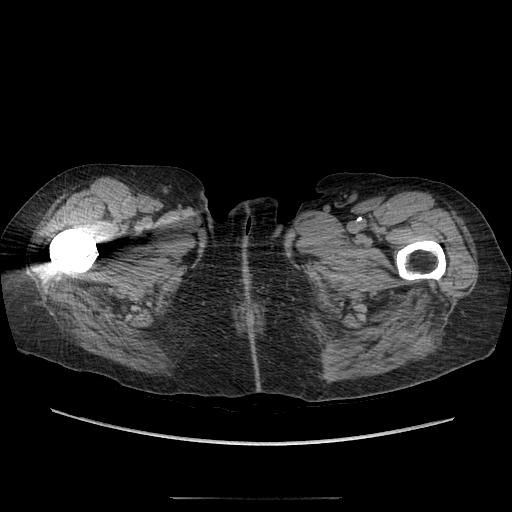
[im 38/123  soft-tissue]
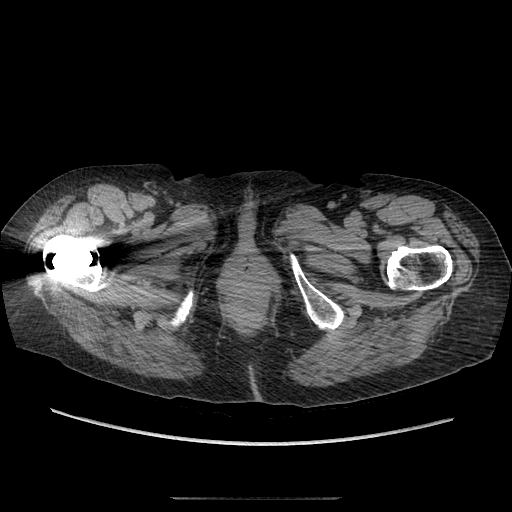
[im 47/123  soft-tissue]
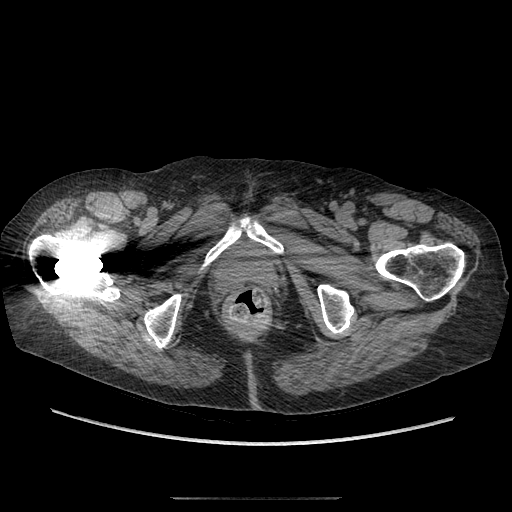
[im 66/123  soft-tissue]
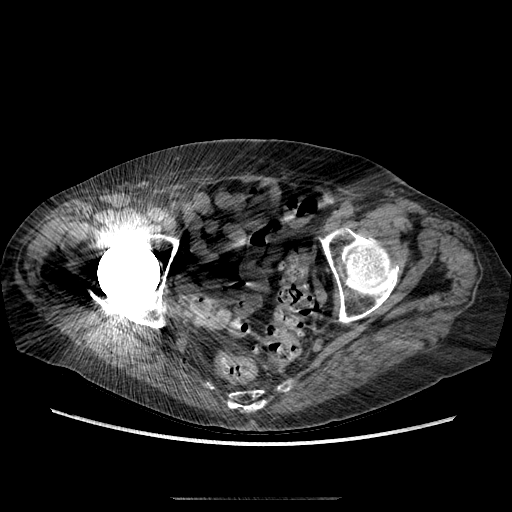
[im 76/123  soft-tissue]
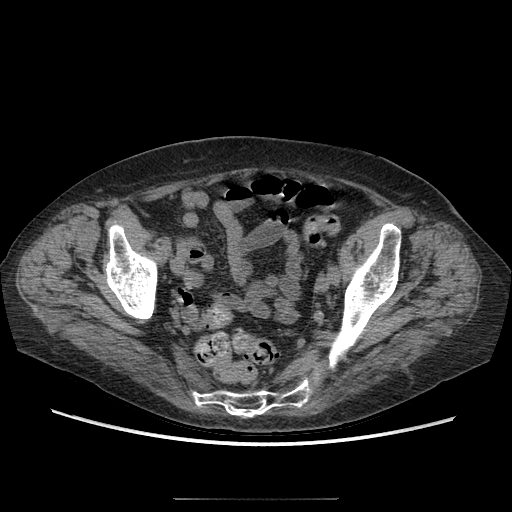
[im 85/123  soft-tissue]
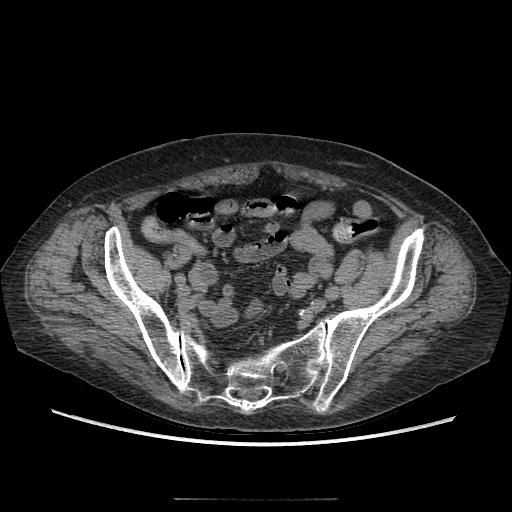
[im 85/123  lung]
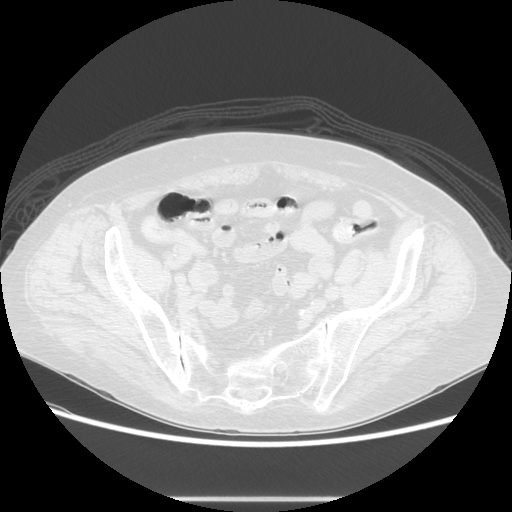
[im 94/123  soft-tissue]
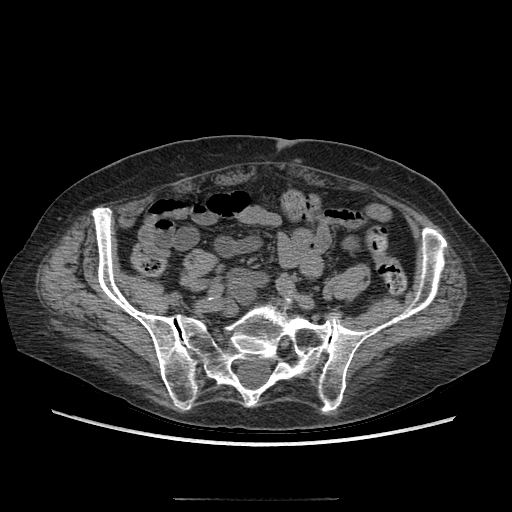
[im 94/123  lung]
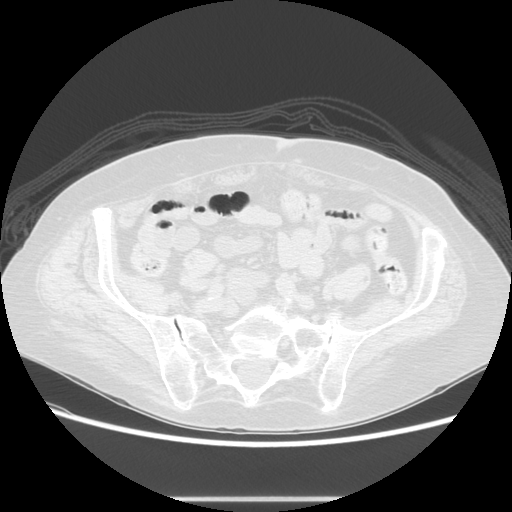
[im 94/123  bone]
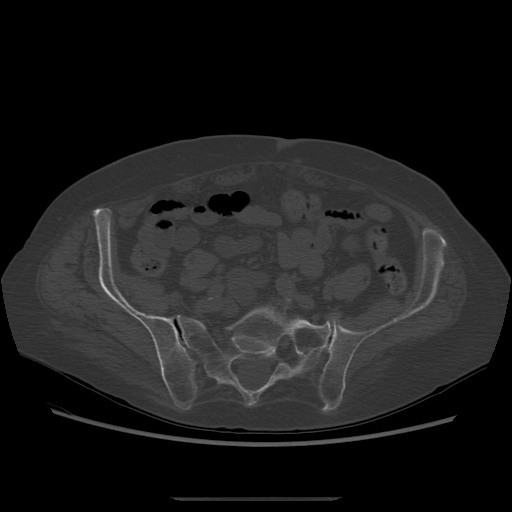
[im 104/123  soft-tissue]
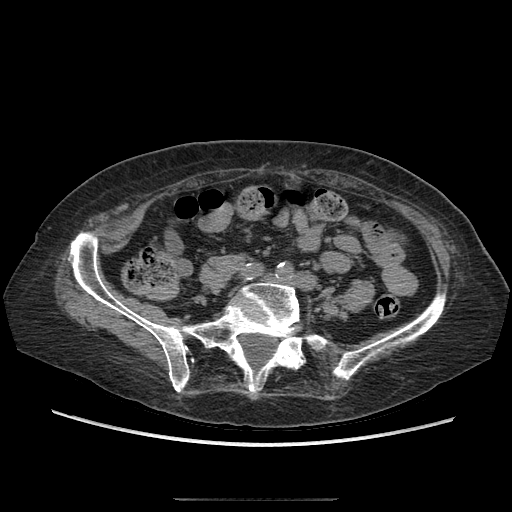
[im 104/123  lung]
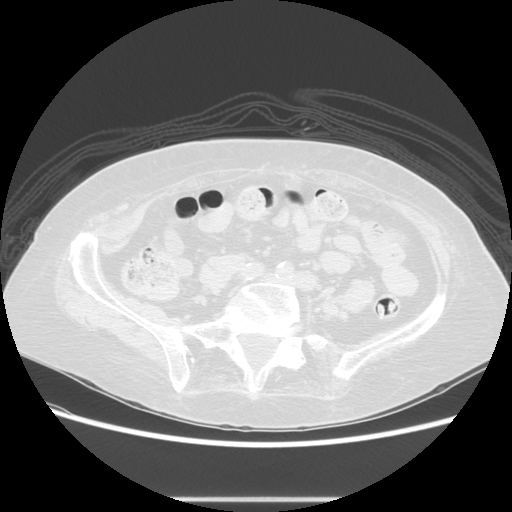
[im 113/123  soft-tissue]
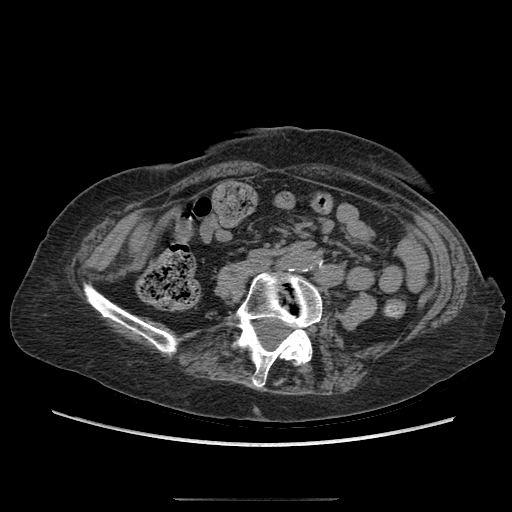
[im 113/123  lung]
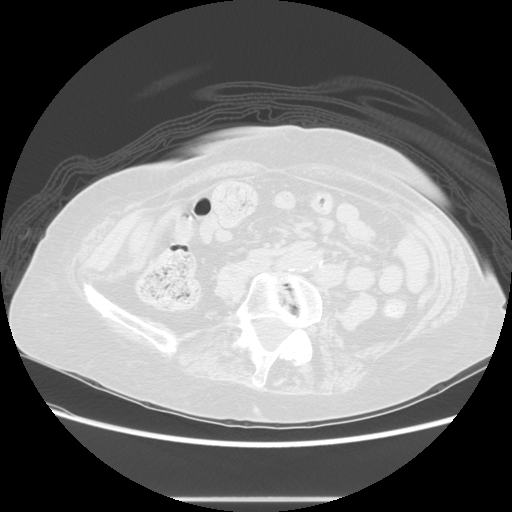

[Series 604: coronal body · coronal · 0.73mm/px · 1 of 95 slices shown, 2 images]
[im 32/95  soft-tissue]
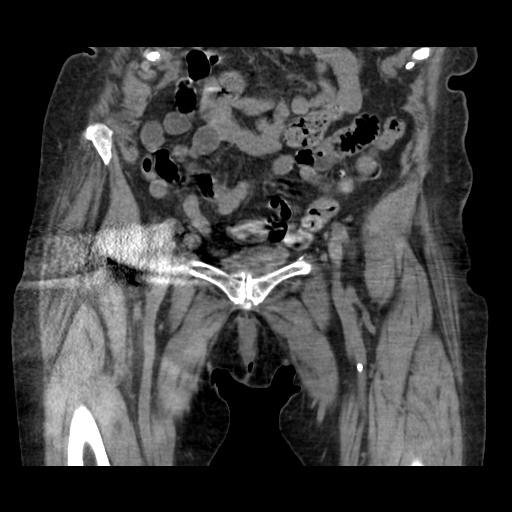
[im 32/95  bone]
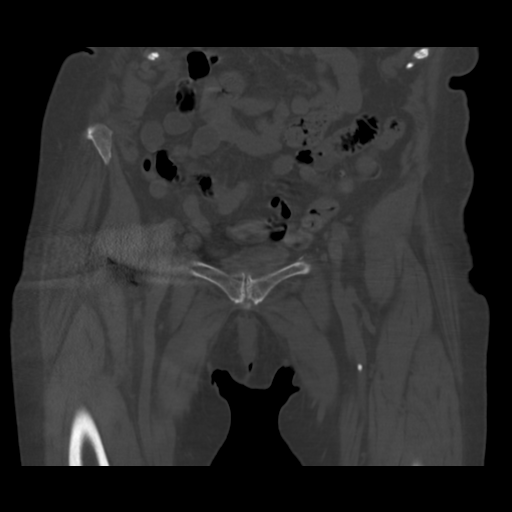

[12 of 36 positions shown; findings below may reference images not displayed]

FINDINGS: Urinary Tract: The distal ureters appear normal in caliber. The
urinary bladder is not well distended but no abnormality is seen.
The assessment is somewhat limited due to considerable linear
artifacts created by the right total hip replacement.

Bowel: There are scattered rectosigmoid colon diverticula. No
diverticulitis is seen. Small bowel that is visualized is not
dilated.

Vascular/Lymphatic: Abdominal aortic atherosclerosis is present.

Reproductive: Uterus appears to have been resected. No adnexal
lesion is seen. No fluid is noted within the pelvis.

Other:  None

Musculoskeletal: This patient has undergone prior right total hip
replacement. Reviewing bone window images, no fracture is seen.
Expected postoperative changes noted within the right femur. Hit
pelvic rami appear intact. There are degenerative changes within the
SI joints. No sacral fracture is seen. There is degenerative change
noted as well involving L4-L5 and L5 S1.
IMPRESSION: 1. No pelvic fracture is seen.  The bones are somewhat osteopenic.
2. Degenerative disc disease at L4-5 and L5-S1.
3. Right total hip replacement.  No complicating features.

## 2017-05-29 ENCOUNTER — Ambulatory Visit: Payer: Medicare Other | Admitting: Podiatry

## 2017-06-05 ENCOUNTER — Ambulatory Visit: Payer: Medicare Other | Admitting: Podiatry

## 2017-07-14 NOTE — Anesthesia Postprocedure Evaluation (Deleted)
Anesthesia Post Note  Patient: Angelica Bell  Procedure(s) Performed: Procedure(s) (LRB): INTRAMEDULLARY (IM) NAIL FEMORAL (Left)     Anesthesia Post Evaluation  Last Vitals:  Vitals:   02/19/17 0523 02/19/17 1311  BP: 110/61 100/65  Pulse: 84 92  Resp: 18 18  Temp: 36.7 C 36.6 C    Last Pain:  Vitals:   02/19/17 1311  TempSrc: Oral  PainSc:                  Darrien Belter,E. Mehar Kirkwood

## 2017-07-14 NOTE — Addendum Note (Signed)
Addendum  created 07/14/17 1624 by Annye Asa, MD   Delete clinical note, Sign clinical note

## 2017-07-14 NOTE — Addendum Note (Signed)
Addendum  created 07/14/17 1538 by Annye Asa, MD   Sign clinical note

## 2017-07-29 ENCOUNTER — Encounter (HOSPITAL_COMMUNITY): Payer: Self-pay | Admitting: Emergency Medicine

## 2017-07-29 ENCOUNTER — Emergency Department (HOSPITAL_COMMUNITY)
Admission: EM | Admit: 2017-07-29 | Discharge: 2017-07-29 | Disposition: A | Discharge status: Home / Self Care | Payer: Medicare Other | Attending: Emergency Medicine | Admitting: Emergency Medicine

## 2017-07-29 ENCOUNTER — Emergency Department (HOSPITAL_COMMUNITY): Payer: Medicare Other

## 2017-07-29 DIAGNOSIS — S060X0A Concussion without loss of consciousness, initial encounter: Secondary | ICD-10-CM | POA: Diagnosis not present

## 2017-07-29 DIAGNOSIS — S838X1A Sprain of other specified parts of right knee, initial encounter: Secondary | ICD-10-CM | POA: Diagnosis not present

## 2017-07-29 DIAGNOSIS — S161XXA Strain of muscle, fascia and tendon at neck level, initial encounter: Secondary | ICD-10-CM | POA: Insufficient documentation

## 2017-07-29 DIAGNOSIS — Z7982 Long term (current) use of aspirin: Secondary | ICD-10-CM | POA: Insufficient documentation

## 2017-07-29 DIAGNOSIS — Y92002 Bathroom of unspecified non-institutional (private) residence single-family (private) house as the place of occurrence of the external cause: Secondary | ICD-10-CM | POA: Insufficient documentation

## 2017-07-29 DIAGNOSIS — Z79899 Other long term (current) drug therapy: Secondary | ICD-10-CM | POA: Diagnosis not present

## 2017-07-29 DIAGNOSIS — I10 Essential (primary) hypertension: Secondary | ICD-10-CM | POA: Diagnosis not present

## 2017-07-29 DIAGNOSIS — S0990XA Unspecified injury of head, initial encounter: Secondary | ICD-10-CM | POA: Diagnosis present

## 2017-07-29 DIAGNOSIS — M549 Dorsalgia, unspecified: Secondary | ICD-10-CM | POA: Diagnosis not present

## 2017-07-29 DIAGNOSIS — Z87891 Personal history of nicotine dependence: Secondary | ICD-10-CM | POA: Insufficient documentation

## 2017-07-29 DIAGNOSIS — W19XXXA Unspecified fall, initial encounter: Secondary | ICD-10-CM | POA: Insufficient documentation

## 2017-07-29 DIAGNOSIS — Y999 Unspecified external cause status: Secondary | ICD-10-CM | POA: Insufficient documentation

## 2017-07-29 DIAGNOSIS — S76012A Strain of muscle, fascia and tendon of left hip, initial encounter: Secondary | ICD-10-CM

## 2017-07-29 DIAGNOSIS — Y939 Activity, unspecified: Secondary | ICD-10-CM | POA: Diagnosis not present

## 2017-07-29 LAB — CBC WITH DIFFERENTIAL/PLATELET
Basophils Absolute: 0 10*3/uL (ref 0.0–0.1)
Basophils Relative: 0 %
EOS PCT: 5 %
Eosinophils Absolute: 0.5 10*3/uL (ref 0.0–0.7)
HCT: 35 % — ABNORMAL LOW (ref 36.0–46.0)
Hemoglobin: 10.9 g/dL — ABNORMAL LOW (ref 12.0–15.0)
LYMPHS ABS: 2.8 10*3/uL (ref 0.7–4.0)
Lymphocytes Relative: 24 %
MCH: 26.7 pg (ref 26.0–34.0)
MCHC: 31.1 g/dL (ref 30.0–36.0)
MCV: 85.6 fL (ref 78.0–100.0)
MONO ABS: 1 10*3/uL (ref 0.1–1.0)
Monocytes Relative: 9 %
Neutro Abs: 7.4 10*3/uL (ref 1.7–7.7)
Neutrophils Relative %: 62 %
PLATELETS: 314 10*3/uL (ref 150–400)
RBC: 4.09 MIL/uL (ref 3.87–5.11)
RDW: 15.5 % (ref 11.5–15.5)
WBC: 11.8 10*3/uL — AB (ref 4.0–10.5)

## 2017-07-29 LAB — BASIC METABOLIC PANEL
Anion gap: 6 (ref 5–15)
BUN: 19 mg/dL (ref 6–20)
CHLORIDE: 102 mmol/L (ref 101–111)
CO2: 32 mmol/L (ref 22–32)
Calcium: 9 mg/dL (ref 8.9–10.3)
Creatinine, Ser: 0.88 mg/dL (ref 0.44–1.00)
GFR calc Af Amer: >60 mL/min (ref >60)
GFR calc non Af Amer: 59 mL/min — ABNORMAL LOW (ref >60)
GLUCOSE: 121 mg/dL — AB (ref 65–99)
Potassium: 4.4 mmol/L (ref 3.5–5.1)
SODIUM: 140 mmol/L (ref 135–145)

## 2017-07-29 NOTE — ED Notes (Signed)
SW at bedside.

## 2017-07-29 NOTE — ED Notes (Signed)
Case management at bedside.

## 2017-07-29 NOTE — Progress Notes (Signed)
CSW responded to consult request from ED secretary at aprox 4:15pm who stated EDP asked pt be consideed for placement.  CSW immediately called PT to alert them the CSW was going to request a PT consult be placed.    CSW spoke to CM who is following up with the pt's grandson who is supportive and fully involved with pt's case.  Pt stated her grandson could stay with her for a couple of days if necessary.    CSW met with PM EDP who had assisted CSW with the pt since AM EDP had left for the day.  CSW asked EDP to place CM consult and PT consult and asked pt permission to speak to pt's grandson.  CSW spoke to pt who stated she wishes to return home and is refusing SNF at this time.    CSW spoke to grandson who stated he lives five minutes away from the pt and is considering moving in with her for her safety. Pt's grandson states he desires for her to return home where he ca care for her at this time.  CSW spoke to RN and consulted with the NT who assisted the pt.  NT stated pt has now lifted herself up off the commode by herself twice.  Pt stated to CSW she can ambulate fine at home using her two walkers.  NT agreed to RN and CSW this might be possible.  RN spoke to the CSW and pointed out pt has been drowsy since admitting and pt had two fentanyl patches in her back, which the pt states she forgot she had placed there.  RN also noted pt takes pain medications, xanax as needed, as well as places fentanyl patches on herself as well.   CSW noted this and will update the pt's son about the simultaneous use of the substances and the possible dangers, as well.    PT never responded to the consult, but pt's ability to lift herself up has improved.    CSW will continue to follow for D/C needs.  Alphonse Guild. Athziri Freundlich, Reed Pandy, CSI Clinical Social Worker Ph: [phone]

## 2017-07-29 NOTE — ED Provider Notes (Signed)
[facility] DEPT Provider Note   CSN: [id number] Arrival date & time: 07/29/17  0240     History   Chief Complaint Chief Complaint  Patient presents with  . Fall    HPI Angelica Bell is a 83 y.o. female.  The history is provided by the patient.  Fall  This is a new problem. Episode onset: prior to arrival. The problem occurs constantly. The problem has not changed since onset.Associated symptoms include headaches. Pertinent negatives include no chest pain, no abdominal pain and no shortness of breath. The symptoms are aggravated by walking. The symptoms are relieved by rest. She has tried rest for the symptoms. The treatment provided no relief.  pt presents s/p fall She reports she was in the bathroom and fell onto back She hit her head but denies LOC She reports neck pain, back pain and left hip pain She is on chronic pain meds at home No other acute issues reported   Past Medical History:  Diagnosis Date  . Chronic back pain    with degenerative joint disease  . Chronic pain disorder    with narcotic management  . Dry eyes   . Hypertension   . Osteoporosis   . PE (pulmonary embolism)    After hysterectomy in 1967  . Pneumonia     Patient Active Problem List   Diagnosis Date Noted  . Closed displaced intertrochanteric fracture of left femur ([place]) 02/16/2017  . Hip fracture, right ([place]) 12/25/2015  . Chronic pain 12/24/2015  . Fracture of femoral neck, right, closed ([name]) 12/24/2015  . Essential hypertension 12/24/2015  . Community acquired pneumonia 04/15/2013  . Hypokalemia 04/15/2013  . Labial cyst 02/08/2012  . POSTMENOPAUSAL SYNDROME 11/28/2009  . PERIPHERAL NEUROPATHY, LOWER EXTREMITY, RIGHT 02/22/2009  . INSOMNIA-SLEEP DISORDER-UNSPEC 10/12/2008  . Lumbago 08/02/2008  . 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 ANXIETY DEPRESSION 12/16/2007  . HYPERLIPIDEMIA 07/08/2007  . Osteoarthritis 07/08/2007  . Osteoporosis 07/08/2007  .  SCOLIOSIS NEC 07/08/2007    Past Surgical History:  Procedure Laterality Date  . ABDOMINAL HYSTERECTOMY    . APPENDECTOMY    . BREAST ENHANCEMENT SURGERY    . FEMUR IM NAIL Left 02/17/2017   Procedure: INTRAMEDULLARY (IM) NAIL FEMORAL;  Surgeon: Nicholes Stairs, MD;  Location: [facility];  Service: Orthopedics;  Laterality: Left;  . HIP ARTHROPLASTY Right 12/24/2015   Procedure: ARTHROPLASTY BIPOLAR HIP (HEMIARTHROPLASTY);  Surgeon: Netta Cedars, MD;  Location: [facility];  Service: Orthopedics;  Laterality: Right;  . TONSILLECTOMY      OB History    No data available       Home Medications    Prior to Admission medications   Medication Sig Start Date End Date Taking? Authorizing Provider  acetaminophen (TYLENOL) 500 MG tablet Take 500 mg by mouth every 6 (six) hours as needed for moderate pain. x10 days 02/25/17   [provider]  ALPRAZolam Duanne Moron) 0.5 MG tablet Take 0.5 mg by mouth daily as needed for anxiety or sleep.    [provider]  aspirin EC 81 MG tablet Take 1 tablet (81 mg total) by mouth 2 (two) times daily. 02/19/17   Bonnielee Haff, MD  escitalopram (LEXAPRO) 20 MG tablet Take 20 mg by mouth daily.  02/04/17   [provider]  fentaNYL (DURAGESIC - DOSED MCG/HR) 25 MCG/HR patch Place 1 patch (25 mcg total) onto the skin every 3 (three) days. 02/19/17   Bonnielee Haff, MD  fluorometholone (FML) 0.1 % ophthalmic suspension Place 1  drop into both eyes daily.  03/25/15   [provider]  methocarbamol (ROBAXIN) 500 MG tablet Take 500 mg by mouth every 8 (eight) hours.    [provider]  Multiple Vitamin (MULTIVITAMIN) tablet Take 1 tablet by mouth daily.    [provider]  oxycodone (ROXICODONE) 30 MG immediate release tablet Take 1 tablet (30 mg total) by mouth every 6 (six) hours as needed (severe pain). 02/19/17   Bonnielee Haff, MD  sennosides-docusate sodium (SENOKOT-S) 8.6-50 MG tablet Take 2 tablets by mouth 2 (two)  times daily.    [provider]  traZODone (DESYREL) 50 MG tablet Take 50 mg by mouth at bedtime as needed for sleep.    [provider]    Family History Family History  Problem Relation Age of Onset  . Heart Problems Mother     Social History Social History  Substance Use Topics  . Smoking status: Former Smoker    Years: 13.00    Quit date: 04/15/1976  . Smokeless tobacco: Never Used  . Alcohol use No     Allergies   Amoxicillin-pot clavulanate; Lisinopril-hydrochlorothiazide; and Sulfonamide derivatives   Review of Systems Review of Systems  Constitutional: Negative for fever.  Respiratory: Negative for shortness of breath.   Cardiovascular: Negative for chest pain.  Gastrointestinal: Negative for abdominal pain.  Musculoskeletal: Positive for arthralgias, back pain and neck pain.  Neurological: Positive for headaches.  All other systems reviewed and are negative.    Physical Exam Updated Vital Signs BP (!) 142/61 (BP Location: Left Arm)   Pulse 93   Temp 97.8 F (36.6 C) (Oral)   Resp 19   SpO2 92%   Physical Exam CONSTITUTIONAL: Elderly, frail HEAD: Normocephalic/atraumatic ENMT: Mucous membranes moist NECK: supple no meningeal signs SPINE/BACK:diffuse cervical and lumbar spinal tenderness, No bruising/crepitance/stepoffs noted to spine CV: S1/S2 noted, no murmurs/rubs/gallops noted LUNGS: Lungs are clear to auscultation bilaterally, no apparent distress ABDOMEN: soft, nontender  NEURO: Pt is awake/alert/appropriate, moves all extremitiesx4.  She appears mildly sedated EXTREMITIES: pulses normal/equal, full ROM, tenderness with ROM of left hip and tenderness to palpation of right knee All other extremities/joints palpated/ranged and nontender SKIN: warm, color normal PSYCH: no abnormalities of mood noted, alert and oriented to situation   ED Treatments / Results  Labs (all labs ordered are listed, but only abnormal results are  displayed) Labs Reviewed  BASIC METABOLIC PANEL - Abnormal; Notable for the following:       Result Value   Glucose, Bld 121 (*)    GFR calc non Af Amer 59 (*)    All other components within normal limits  CBC WITH DIFFERENTIAL/PLATELET - Abnormal; Notable for the following:    WBC 11.8 (*)    Hemoglobin 10.9 (*)    HCT 35.0 (*)    All other components within normal limits    EKG  EKG Interpretation  Date/Time:  Tuesday July 29 2017 04:27:19 EDT Ventricular Rate:  76 PR Interval:    QRS Duration: 87 QT Interval:  377 QTC Calculation: 424 R Axis:   10 Text Interpretation:  Sinus rhythm Probable left atrial enlargement Low voltage, precordial leads Abnormal R-wave progression, early transition No significant change since last tracing Confirmed by Ripley Fraise [phone]) on 07/29/2017 4:38:15 AM       Radiology Dg Chest 1 View  Result Date: 07/29/2017 CLINICAL DATA:  83 y/o  F; status post fall with pain. EXAM: CHEST 1 VIEW COMPARISON:  02/16/2017 chest radiograph FINDINGS:  Stable cardiac silhouette given projection and technique. Aortic atherosclerosis with calcification. Mild haziness at left lung base probably represents minor atelectasis. No pleural effusion or pneumothorax. Moderate S-shaped curvature of the spine. No acute fracture identified. IMPRESSION: Mild haziness at left lung base probably represents atelectasis. Aortic atherosclerosis. Electronically Signed   By: Kristine Garbe M.D.   On: 07/29/2017 04:24   Ct Head Wo Contrast  Result Date: 07/29/2017 CLINICAL DATA:  Patient fell. No loss of consciousness. History of osteoporosis. EXAM: CT HEAD WITHOUT CONTRAST CT CERVICAL SPINE WITHOUT CONTRAST TECHNIQUE: Multidetector CT imaging of the head and cervical spine was performed following the standard protocol without intravenous contrast. Multiplanar CT image reconstructions of the cervical spine were also generated. COMPARISON:  CT head 02/16/2017. CT head and  cervical spine 04/19/2015 FINDINGS: CT HEAD FINDINGS Brain: Diffuse cerebral atrophy. Low-attenuation changes in the deep white matter consistent small vessel ischemia. Old cerebellar infarcts. No mass effect or midline shift. No abnormal extra-axial fluid collections. Gray-white matter junctions are distinct. Basal cisterns are not effaced. No acute intracranial hemorrhage. Vascular: Vascular calcifications are present. Skull: No depressed skull fractures. Sinuses/Orbits: Paranasal sinuses and mastoid air cells are not opacified. Other: None. CT CERVICAL SPINE FINDINGS Alignment: Normal alignment of the cervical spine and facet joints. C1-2 articulation appears intact. Skull base and vertebrae: No acute fracture. No primary bone lesion or focal pathologic process. Soft tissues and spinal canal: No prevertebral fluid or swelling. No visible canal hematoma. Disc levels: Degenerative changes throughout the cervical spine with narrowed interspaces and associated endplate hypertrophic changes. Degenerative and ligamentous calcification at C1 to. Upper chest: Scarring in the lung apices. Bilateral thyroid gland nodules, largest on the left measuring 1.7 cm diameter. No significant change since previous study. Vascular calcifications in the carotid arteries. Other: None. IMPRESSION: 1. No acute intracranial abnormalities. Chronic atrophy and small vessel ischemic changes. 2. Normal alignment of the cervical spine. Diffuse degenerative changes. No acute displaced fractures identified. Electronically Signed   By: Lucienne Capers M.D.   On: 07/29/2017 04:31   Ct Cervical Spine Wo Contrast  Result Date: 07/29/2017 CLINICAL DATA:  Patient fell. No loss of consciousness. History of osteoporosis. EXAM: CT HEAD WITHOUT CONTRAST CT CERVICAL SPINE WITHOUT CONTRAST TECHNIQUE: Multidetector CT imaging of the head and cervical spine was performed following the standard protocol without intravenous contrast. Multiplanar CT image  reconstructions of the cervical spine were also generated. COMPARISON:  CT head 02/16/2017. CT head and cervical spine 04/19/2015 FINDINGS: CT HEAD FINDINGS Brain: Diffuse cerebral atrophy. Low-attenuation changes in the deep white matter consistent small vessel ischemia. Old cerebellar infarcts. No mass effect or midline shift. No abnormal extra-axial fluid collections. Gray-white matter junctions are distinct. Basal cisterns are not effaced. No acute intracranial hemorrhage. Vascular: Vascular calcifications are present. Skull: No depressed skull fractures. Sinuses/Orbits: Paranasal sinuses and mastoid air cells are not opacified. Other: None. CT CERVICAL SPINE FINDINGS Alignment: Normal alignment of the cervical spine and facet joints. C1-2 articulation appears intact. Skull base and vertebrae: No acute fracture. No primary bone lesion or focal pathologic process. Soft tissues and spinal canal: No prevertebral fluid or swelling. No visible canal hematoma. Disc levels: Degenerative changes throughout the cervical spine with narrowed interspaces and associated endplate hypertrophic changes. Degenerative and ligamentous calcification at C1 to. Upper chest: Scarring in the lung apices. Bilateral thyroid gland nodules, largest on the left measuring 1.7 cm diameter. No significant change since previous study. Vascular calcifications in the carotid arteries. Other: None. IMPRESSION: 1. No  acute intracranial abnormalities. Chronic atrophy and small vessel ischemic changes. 2. Normal alignment of the cervical spine. Diffuse degenerative changes. No acute displaced fractures identified. Electronically Signed   By: Lucienne Capers M.D.   On: 07/29/2017 04:31   Dg Knee Complete 4 Views Right  Result Date: 07/29/2017 CLINICAL DATA:  Right patellar knee pain after a fall. EXAM: RIGHT KNEE - COMPLETE 4+ VIEW COMPARISON:  None. FINDINGS: Diffuse bone demineralization. Degenerative changes in the right knee with mild medial  compartment narrowing and small osteophyte formation. No evidence of acute fracture or dislocation. No focal bone lesion or bone destruction. No significant effusion. Soft tissues are unremarkable. IMPRESSION: Mild degenerative changes in the right knee. No acute displaced fractures identified. Electronically Signed   By: Lucienne Capers M.D.   On: 07/29/2017 04:25   Dg Hip Unilat W Or Wo Pelvis 2-3 Views Left  Result Date: 07/29/2017 CLINICAL DATA:  Patient fell this morning.  Right hip and knee pain. EXAM: DG HIP (WITH OR WITHOUT PELVIS) 2-3V LEFT COMPARISON:  02/16/2017 FINDINGS: Right hip hemiarthroplasty incompletely visualized. Pelvis appears intact. SI joints and symphysis pubis are not displaced. Previous internal fixation of an old inter trochanteric fracture with intramedullary rod and compression screw fixation. Hardware components appear intact. Sclerosis in the fracture line consistent with healing. No evidence of acute fracture or dislocation. No focal bone lesion or bone destruction. Calcified phleboliths in the pelvis. Soft tissues are unremarkable. Degenerative changes and scoliosis of the lumbar spine. IMPRESSION: Previous internal fixation of an old fracture of the inter trochanteric left hip. Right hip hemiarthroplasty. No acute fracture or dislocation. Electronically Signed   By: Lucienne Capers M.D.   On: 07/29/2017 04:23    Procedures Procedures  Medications Ordered in ED Medications - No data to display   Initial Impression / Assessment and Plan / ED Course  I have reviewed the triage vital signs and the nursing notes.  Pertinent labs & imaging results that were available during my care of the patient were reviewed by me and considered in my medical decision making (see chart for details).     7:25 AM Pt in the ED for fall with pain in multiple locations She reports she had mechanical fall while going to bathroom Denies syncope or dizziness Imaging negative, but pt  is very sleepy She is on multiple pain meds/muscle relaxant and benzos Suspect overmedication and  I advised caution Pt reports her pain specialist feels she is NOT overmedicated  At this point she will need to be more awake prior to d/c home She fell asleep while talking to me She declines home health care  At signout to dr pickering, monitor and pt must ambulate prior to discharge home   Final Clinical Impressions(s) / ED Diagnoses   Final diagnoses:  Accident due to mechanical fall without injury, initial encounter  Concussion without loss of consciousness, initial encounter  Strain of neck muscle, initial encounter  Sprain of other ligament of right knee, initial encounter  Hip strain, left, initial encounter    New Prescriptions New Prescriptions   No medications on file     Ripley Fraise, MD 07/29/17 [phone]

## 2017-07-29 NOTE — Care Management Note (Signed)
Case Management Note  Patient Details  Name: Angelica Bell MRN: [id number] Date of Birth: 02/26/1932  Subjective/Objective:   83 year old f with complaint of recent falls.  CM consulted for [facility].                 Action/Plan: Spoke with pt grandson, [name] Case, who states pt has used [facility] previously and would be ok with using them again.  Contacted Mary with [facility] who stated they will accept the pt and contact them shortly.  If [facility] hospital liaison was still on the campus, they would come see the pt prior to D/C.  Notified Dr Vanita Panda of D/C plan home with [facility].  No further CM needs noted at this time.  Expected Discharge Date:   07/29/17               Expected Discharge Plan:  Home/Self Care  In-House Referral:  Clinical Social Work  Discharge planning Services  CM Consult  Post Acute Care Choice:  Home Health (Previously used [facility]) Choice offered to:  Adult Children (Adult grandchildren)  HH Arranged:  PT, RN, OT, Nurse's Aide [place] Agency:  [facility] (now [facility])  Status of Service:  Completed, signed off  Corky Crafts, RN 07/29/2017, 5:15 PM

## 2017-07-29 NOTE — ED Notes (Signed)
Bed: FP69 Expected date:  Expected time:  Means of arrival:  Comments: 83 yr old fall, neck pain

## 2017-07-29 NOTE — ED Triage Notes (Signed)
Pt comes from home via EMS with complaints of fall.  Pt reports getting up to go to the bathroom with walker and lost her balance and fell straight back.  Denies LOC.  No hematoma noted by EMS. Pt reports neck pain, hip pain, and lumbar pain.  No shortening or rotation noted.  Denies blood thinner use. A&O x4. Vitals WNL.

## 2017-07-29 NOTE — ED Notes (Signed)
Attempted to ambulate patient to restroom. Patient had difficult time getting out of bed, bedside commode used with maximal assist.

## 2017-07-29 NOTE — ED Notes (Signed)
Patient transported to xray/CT. 

## 2017-07-29 NOTE — ED Provider Notes (Signed)
  Physical Exam  BP (!) 156/89 (BP Location: Left Arm)   Pulse 73   Temp 97.8 F (36.6 C) (Oral)   Resp 16   SpO2 92%   Physical Exam  ED Course  Procedures  MDM Patient initially had some difficulty ambulating on reexam. Later said she was feeling better after eating. However somewhat nervous about home. Reportedly has family member that can be there at 4:00. Will get social work to see patient.       Davonna Belling, MD 07/29/17 [phone]

## 2017-07-31 ENCOUNTER — Telehealth: Payer: Self-pay | Admitting: Emergency Medicine

## 2017-07-31 NOTE — Telephone Encounter (Signed)
CM called pt's grandson, Mr. Case, to confirm that pt had heard from the [facility] agency.  Left VM for him to return CM call if he had not had contact from agency yet.  No further CM needs noted at this time.

## 2017-09-08 ENCOUNTER — Other Ambulatory Visit: Payer: Self-pay | Admitting: Physician Assistant

## 2017-09-08 DIAGNOSIS — M25472 Effusion, left ankle: Secondary | ICD-10-CM

## 2017-09-12 ENCOUNTER — Other Ambulatory Visit: Payer: Medicare Other

## 2017-09-16 ENCOUNTER — Ambulatory Visit
Admission: RE | Admit: 2017-09-16 | Discharge: 2017-09-16 | Disposition: A | Discharge status: Home / Self Care | Payer: Medicare Other | Source: Ambulatory Visit | Attending: Physician Assistant | Admitting: Physician Assistant

## 2017-09-16 DIAGNOSIS — M25472 Effusion, left ankle: Secondary | ICD-10-CM

## 2017-10-15 ENCOUNTER — Ambulatory Visit (HOSPITAL_COMMUNITY): Payer: Medicare Other | Admitting: Psychiatry

## 2017-10-16 IMAGING — CR DG CHEST 2V
1 series · 1 of 1 positions shown · non-contrast
Comparison: August 02, 2016

CLINICAL DATA: Pain following fall

EXAM:
CHEST  2 VIEW

[x chest ap]
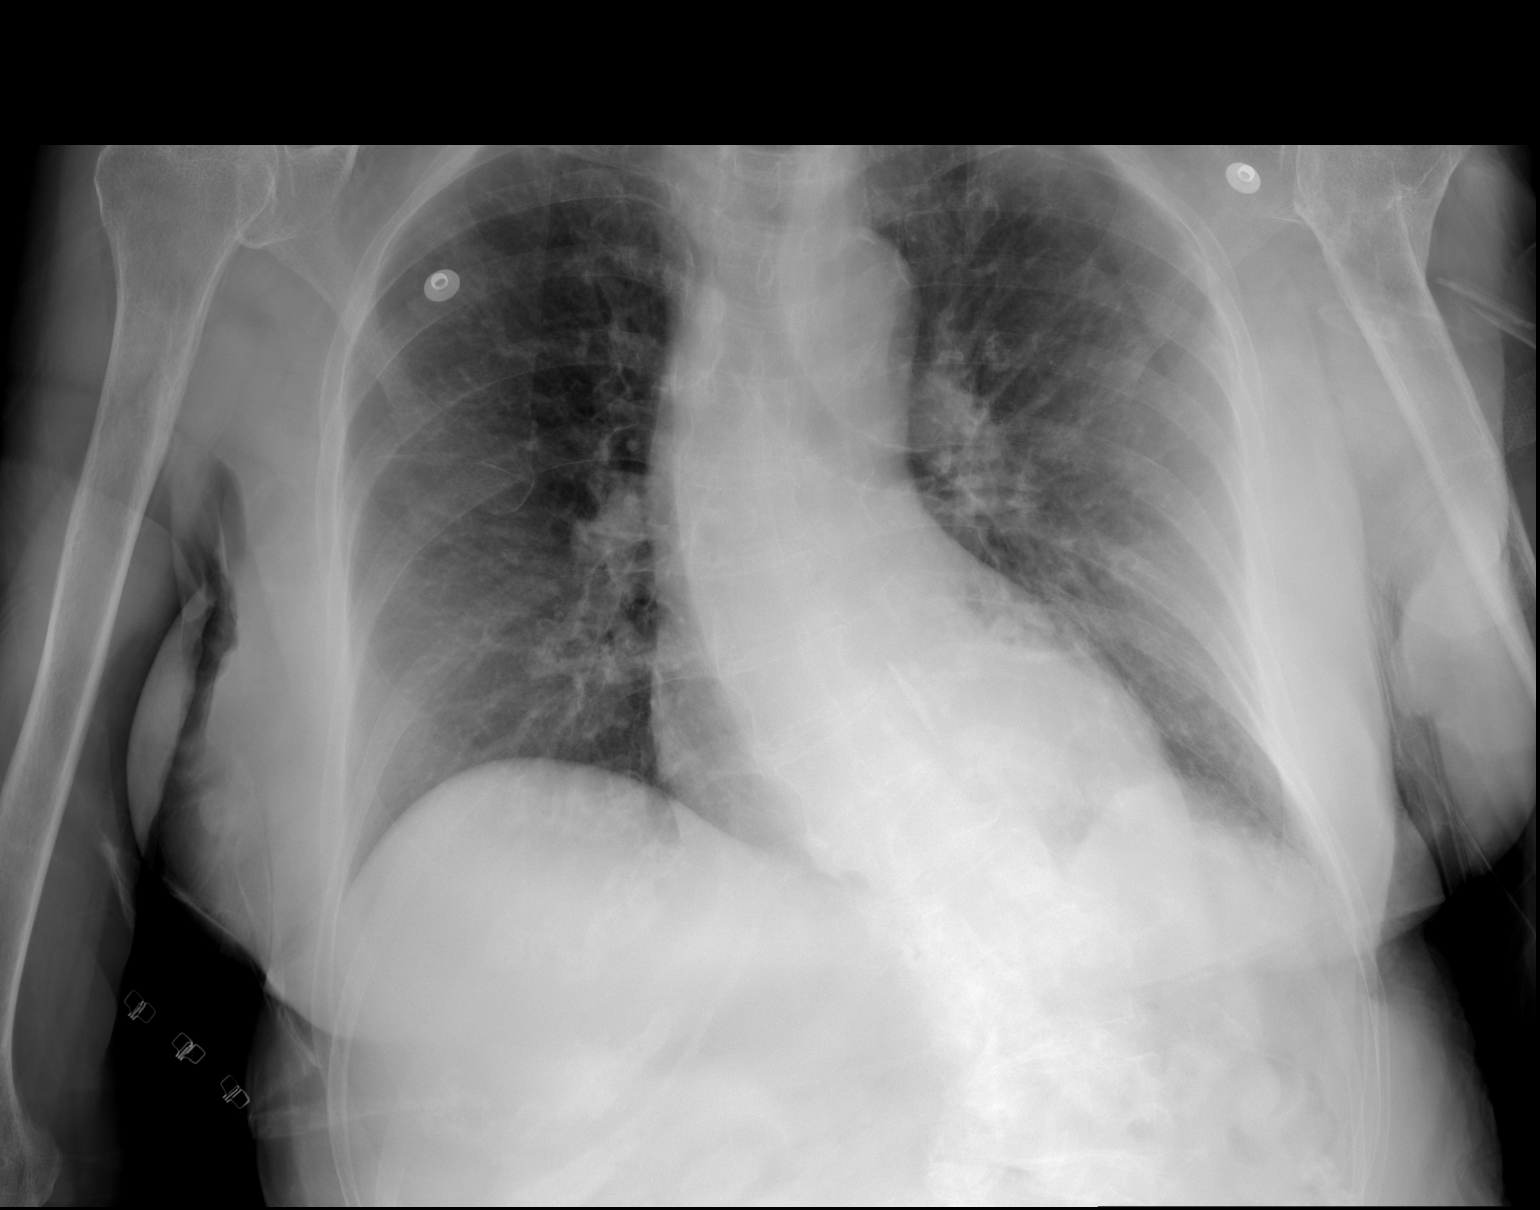

[1 of 1 positions shown; findings below may reference images not displayed]

FINDINGS: There is no edema or consolidation. Heart size and pulmonary
vascularity are normal. No adenopathy. There is atherosclerotic
calcification in the aorta. No pneumothorax. There is an old healed
fracture proximal left humerus with remodeling. There is
thoracolumbar levoscoliosis.
IMPRESSION: No edema or consolidation. Aortic atherosclerosis. Old healed
fracture with remodeling left proximal humerus. Marked scoliosis.

## 2017-10-16 IMAGING — CR DG HIP (WITH OR WITHOUT PELVIS) 2-3V*L*
3 series · 3 of 3 positions shown · non-contrast
Comparison: None.

CLINICAL DATA: Pain following fall

EXAM:
DG HIP (WITH OR WITHOUT PELVIS) 2-3V LEFT

[x pelvis]
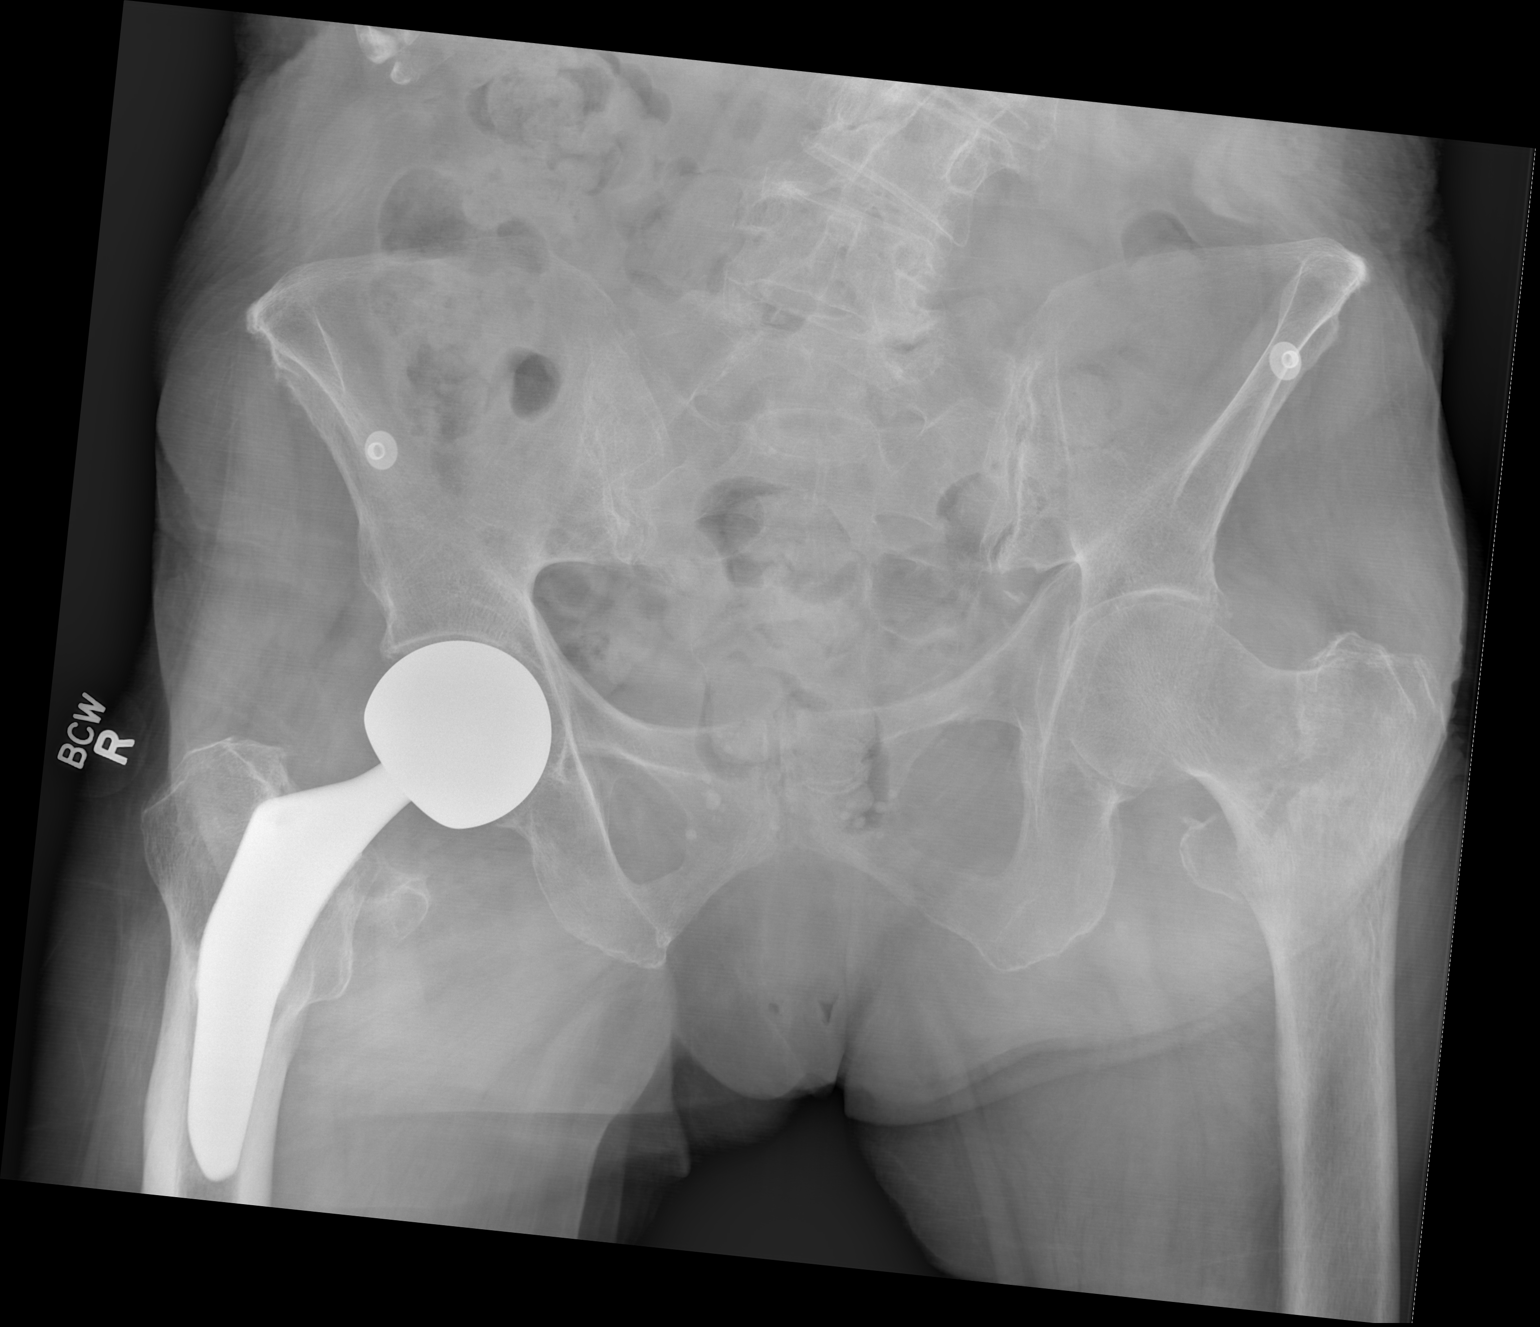

[x hip ap left]
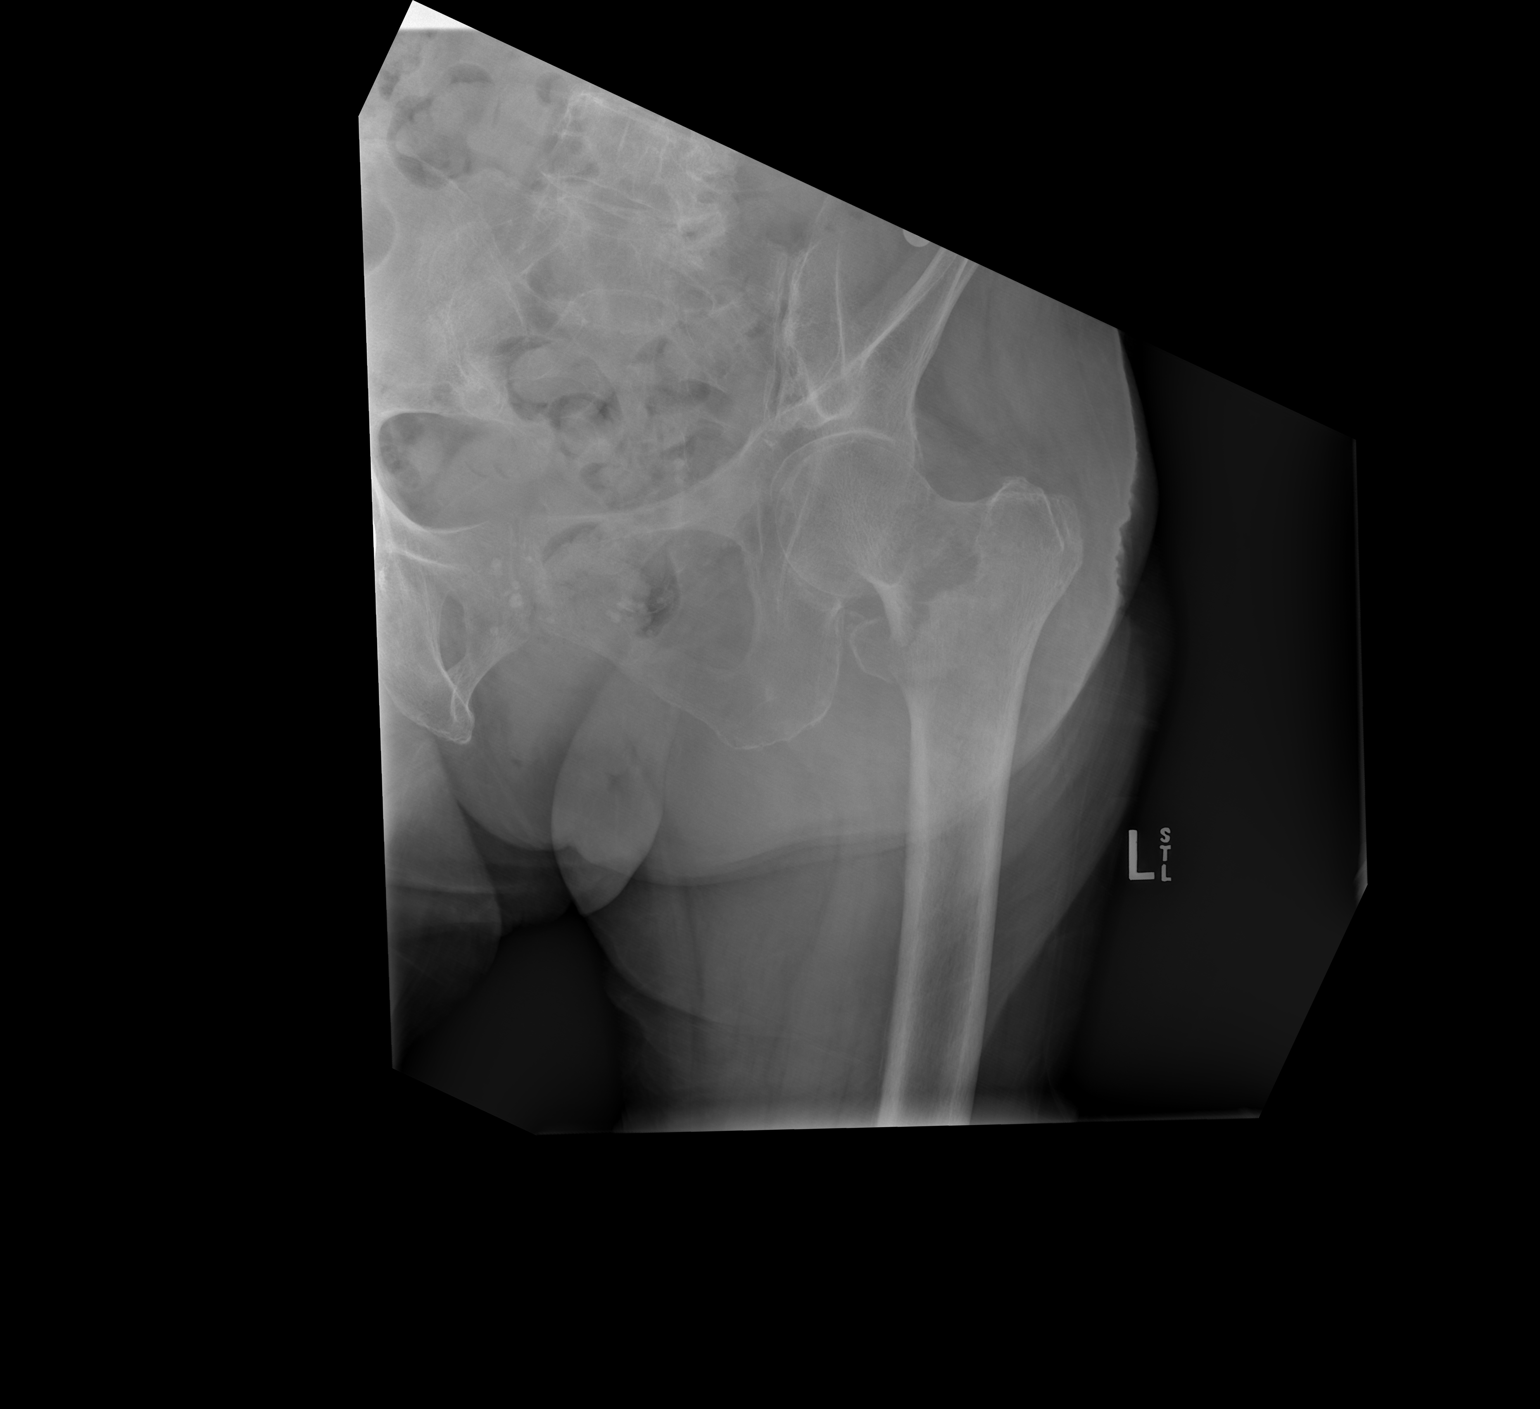

[w hip lat left]
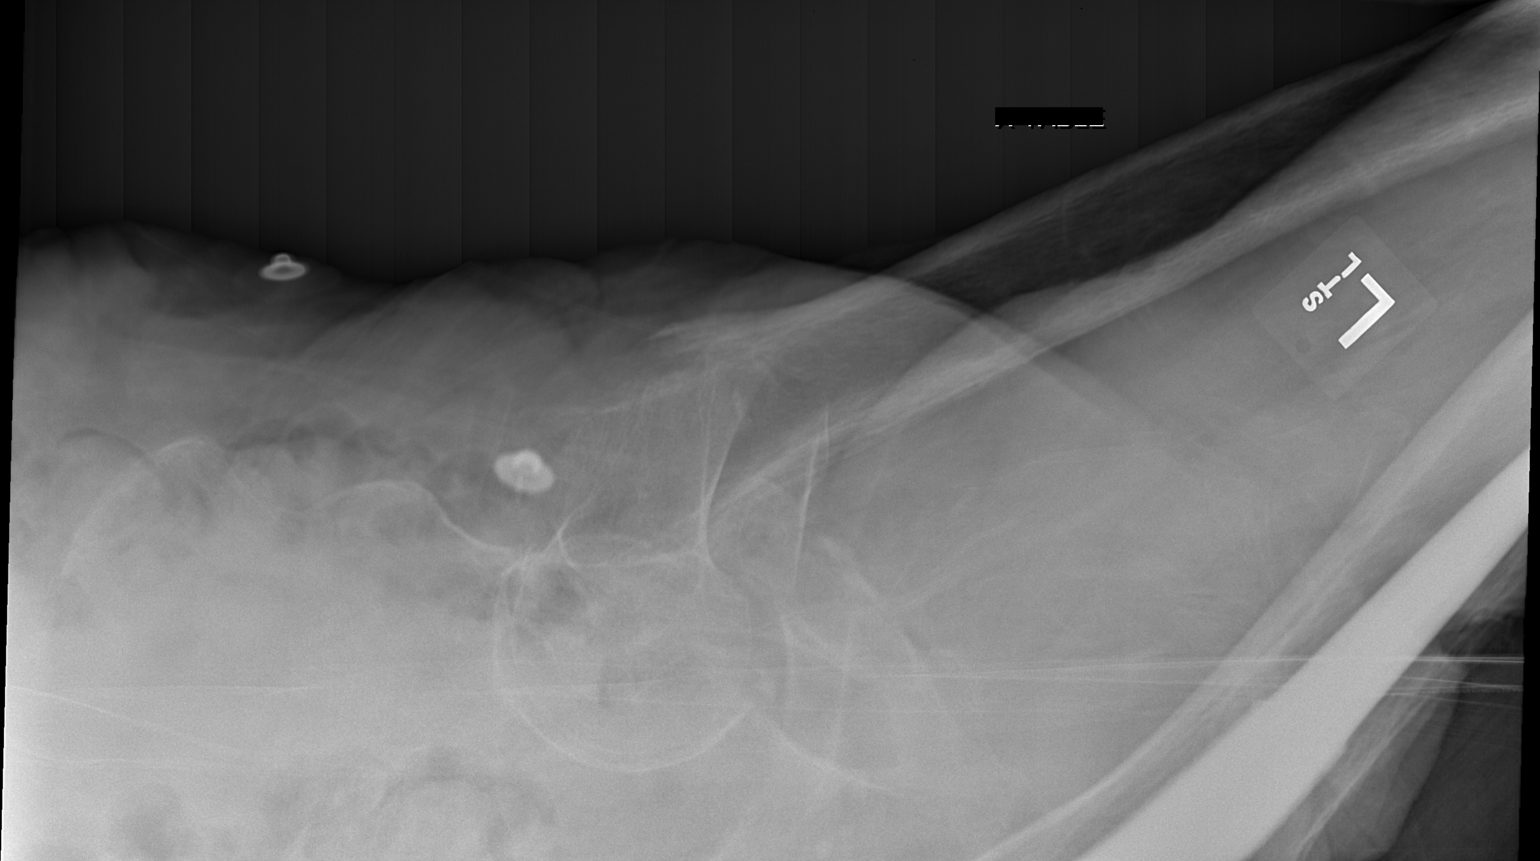

[3 of 3 positions shown; findings below may reference images not displayed]

FINDINGS: Frontal pelvis as well as frontal and lateral left hip images were
obtained. There is an intertrochanteric femur fracture on the left
with medial angulation distally. There is a total hip prosthesis on
the right which appears well seated. No dislocation. There is
moderate narrowing of the left hip joint. There is lower lumbar
levoscoliosis. There is atherosclerotic calcification in the distal
aorta and right common iliac artery.
IMPRESSION: Intertrochanteric femur fracture on the left with medial angulation
distally. No dislocation. Total hip prosthesis on the right,
well-seated. Narrowing left hip joint. Aortoiliac atherosclerosis.
Lower lumbar levoscoliosis.

## 2017-10-16 IMAGING — CT CT HEAD W/O CM
3 of 4 series · 14 of 47 positions shown, 16 images · non-contrast
Comparison: Head CT 04/19/2015.

CLINICAL DATA: 84-year-old female with history of trauma after a
fall in her kitchen. Injury to the head. No loss of consciousness.

EXAM:
CT HEAD WITHOUT CONTRAST
TECHNIQUE: Contiguous axial images were obtained from the base of the skull
through the vertex without intravenous contrast.

[Series 2: head w/o · axial · non-contrast · 0.45mm/px · z∈[+896,+1036]mm · 8 of 34 slices shown, 10 images]
[im 3/34  brain]
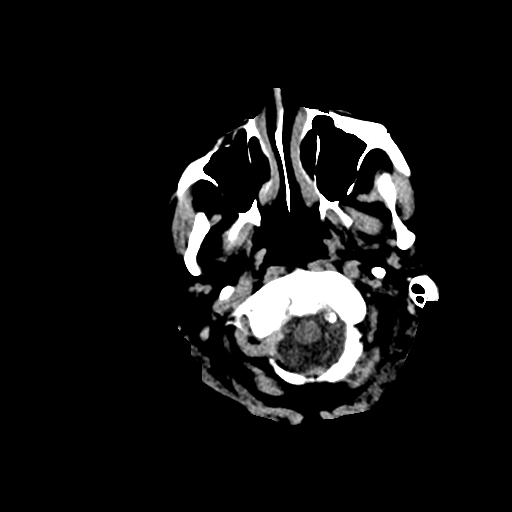
[im 3/34  bone]
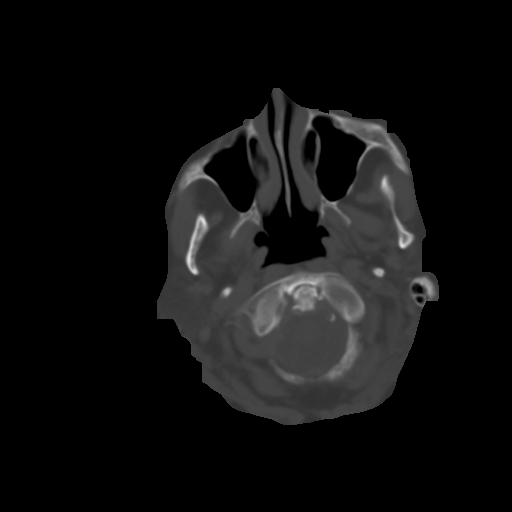
[im 8/34  brain]
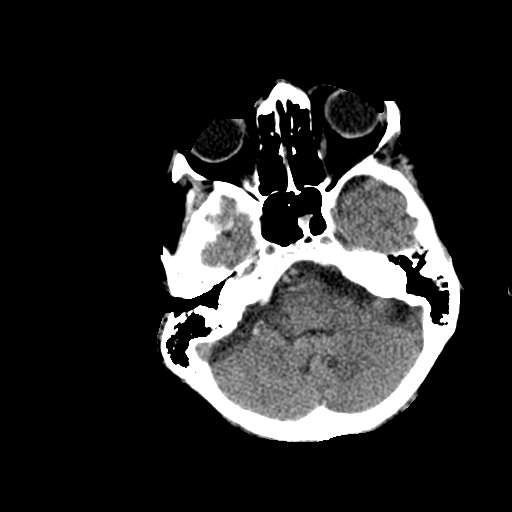
[im 12/34  brain]
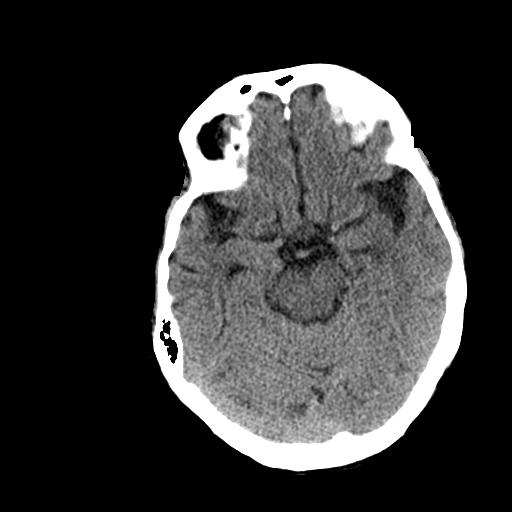
[im 15/34  brain]
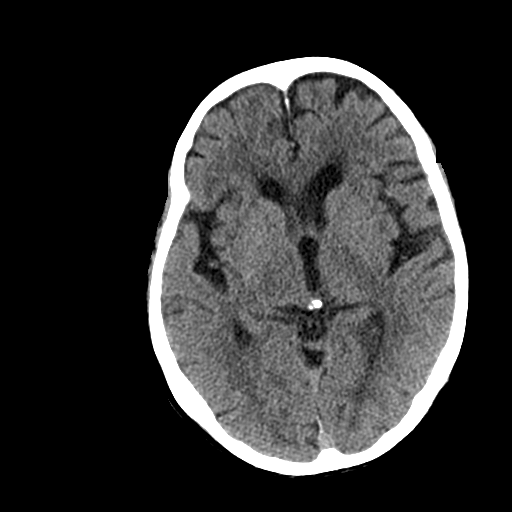
[im 19/34  brain]
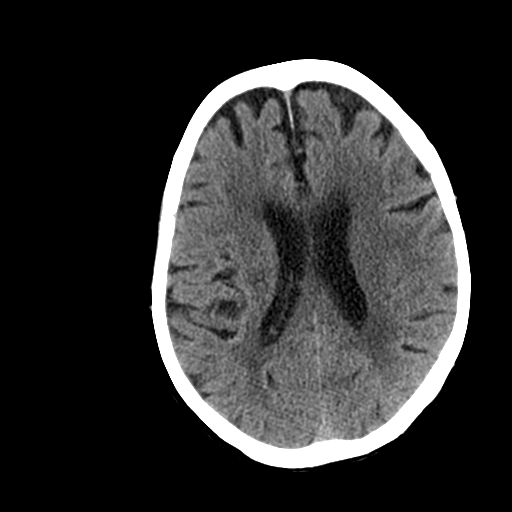
[im 19/34  bone]
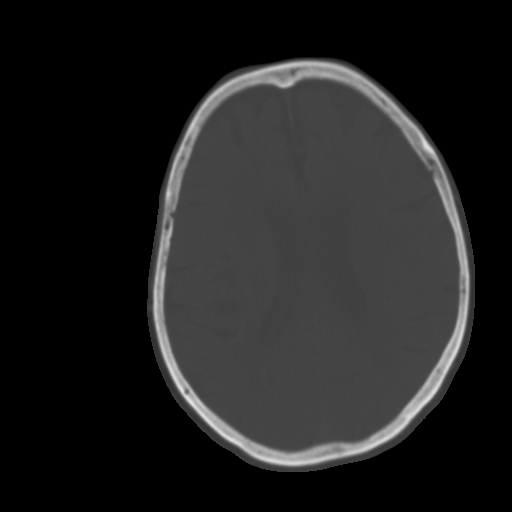
[im 22/34  brain]
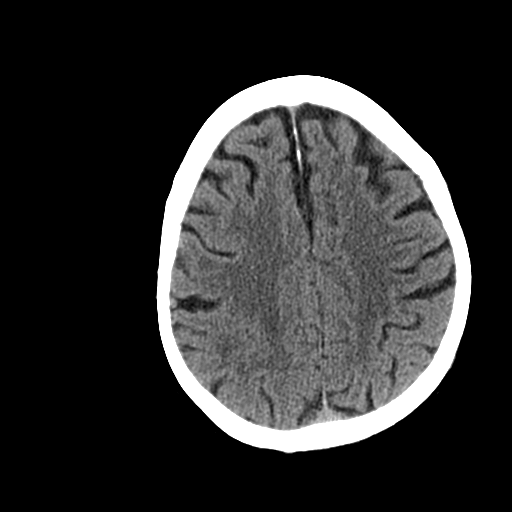
[im 26/34  brain]
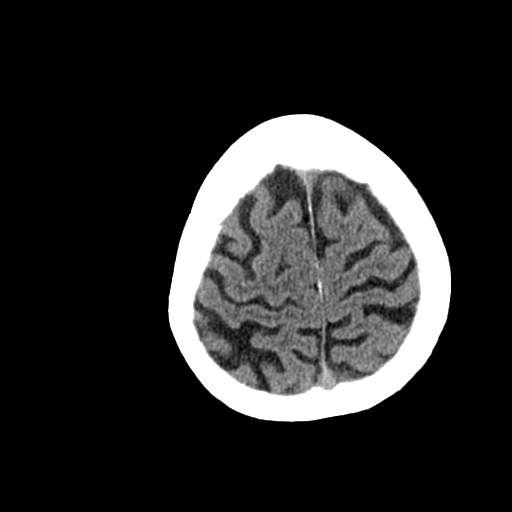
[im 31/34  brain]
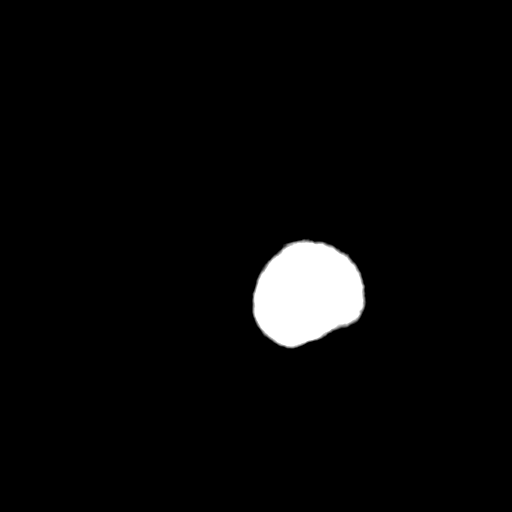

[Series 4: coronal · coronal · 0.32mm/px · 3 of 77 slices shown]
[im 26/77  brain]
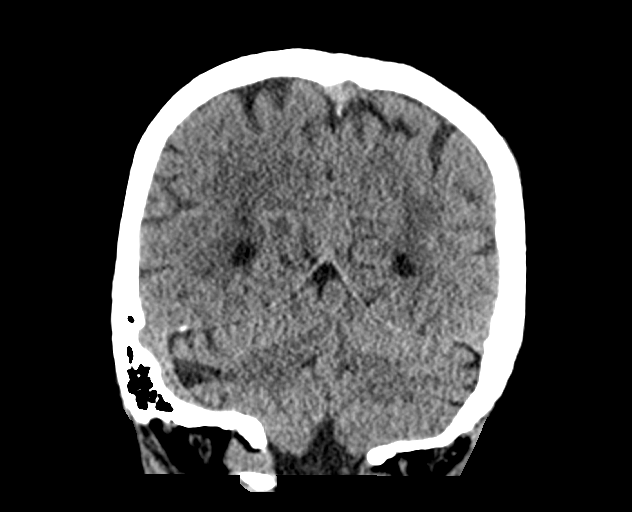
[im 34/77  brain]
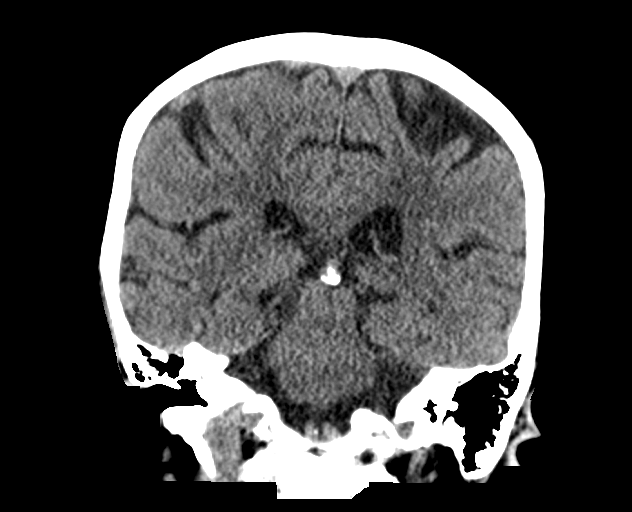
[im 43/77  brain]
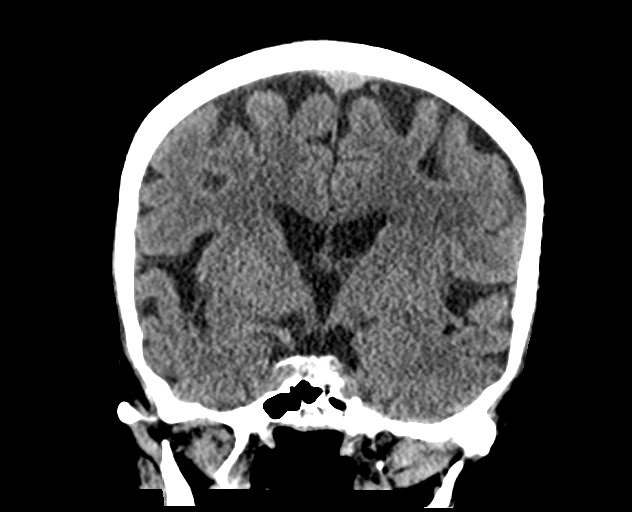

[Series 5: sagittal · sagittal · 0.32mm/px · 3 of 65 slices shown]
[im 22/65  brain]
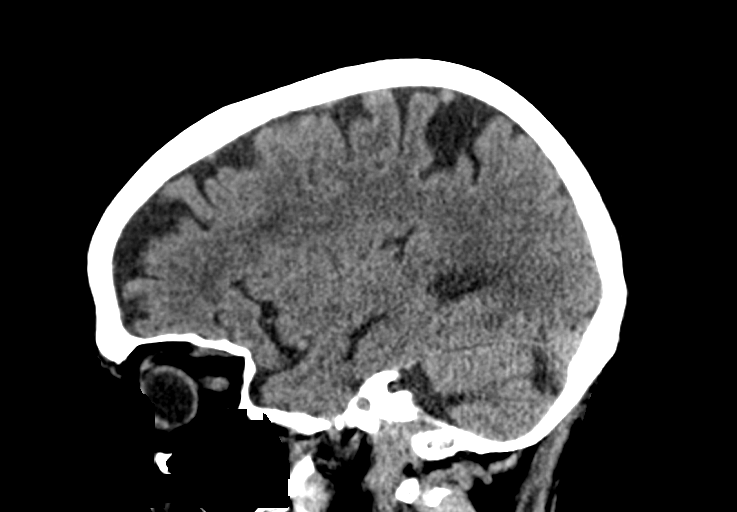
[im 33/65  brain]
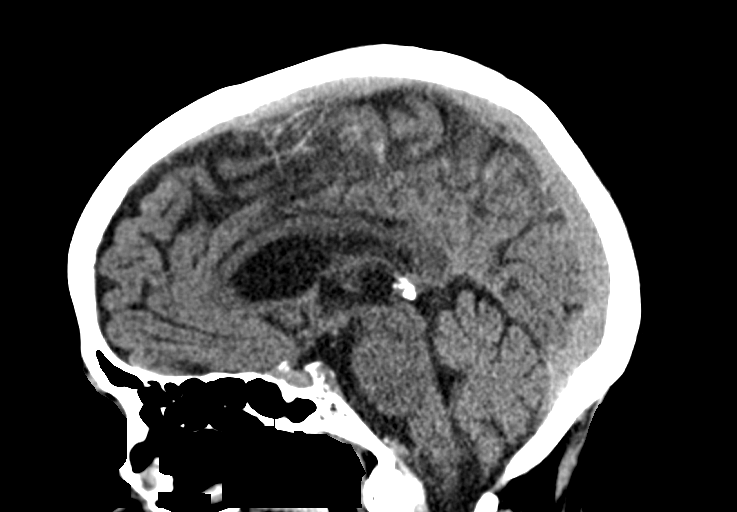
[im 43/65  brain]
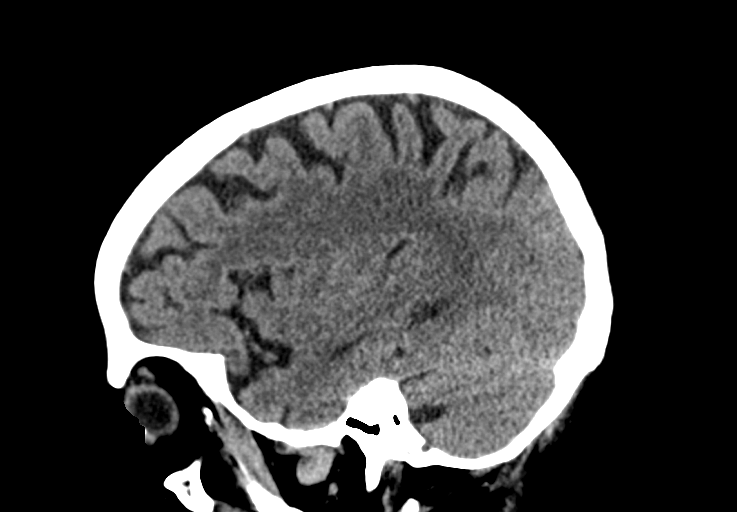

[14 of 47 positions shown; findings below may reference images not displayed]

FINDINGS: Brain: Mild cerebral atrophy. Patchy and confluent areas of
decreased attenuation are noted throughout the deep and
periventricular white matter of the cerebral hemispheres
bilaterally, compatible with chronic microvascular ischemic disease.
Well-defined area of low attenuation in the right cerebellar
hemisphere, new compared to the prior examination, but most
compatible with an area of encephalomalacia from old infarct. No
evidence of acute infarction, hemorrhage, hydrocephalus, extra-axial
collection or mass lesion/mass effect.

Vascular: Cerebrovascular atherosclerosis without acute
abnormalities.

Skull: Normal. Negative for fracture or focal lesion.

Sinuses/Orbits: No acute finding.

Other: None.
IMPRESSION: 1. No evidence of significant acute traumatic injury to the skull or
brain.
2. No acute intracranial abnormalities.
3. Mild cerebral atrophy with chronic microvascular ischemic changes
and evidence of remote right cerebellar infarct, as above.
4. Cerebrovascular atherosclerosis.

## 2017-10-17 IMAGING — RF DG C-ARM 61-120 MIN
1 series · 4 of 4 positions shown · non-contrast
Comparison: Yesterday

CLINICAL DATA: Left femur ORIF

EXAM:
LEFT FEMUR 2 VIEWS; DG C-ARM 61-120 MIN

[Series 1: run · 4 of 4 slices shown]
[im 1/4]
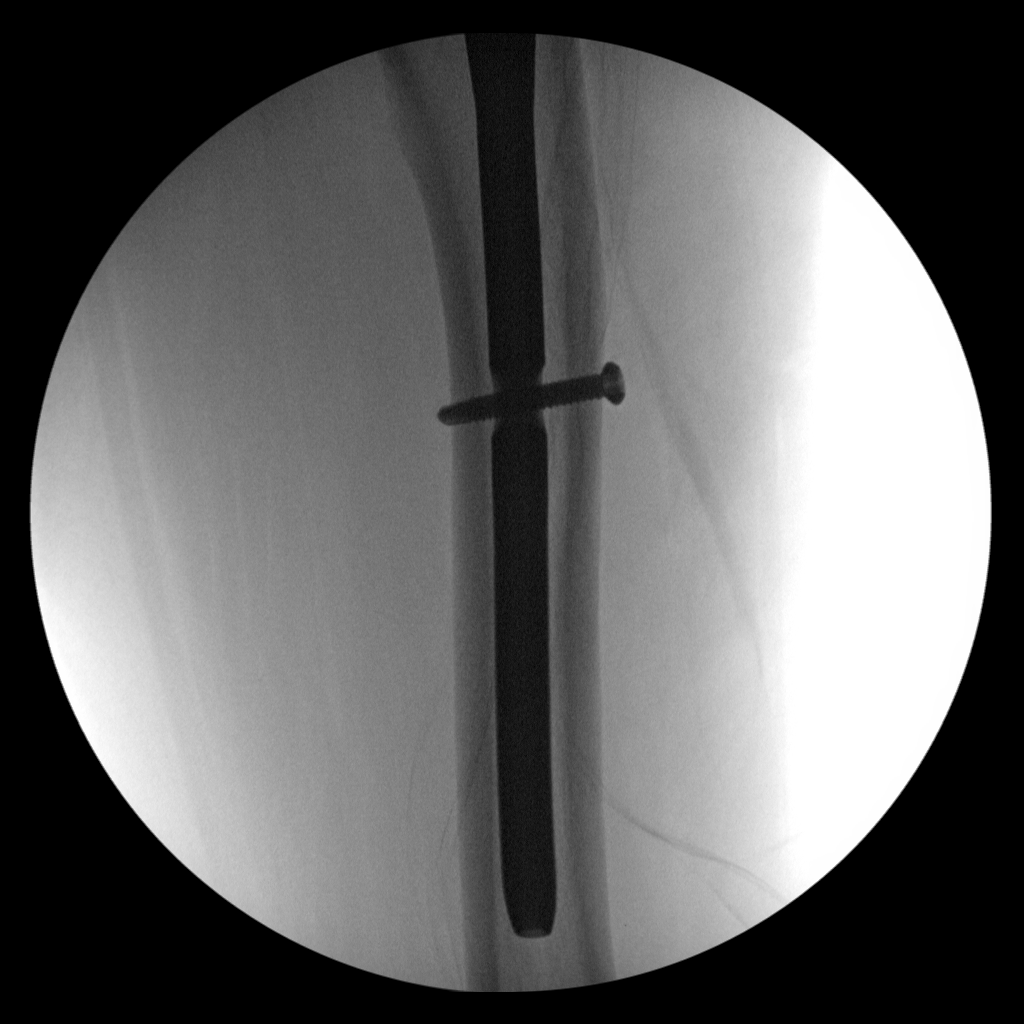
[im 2/4]
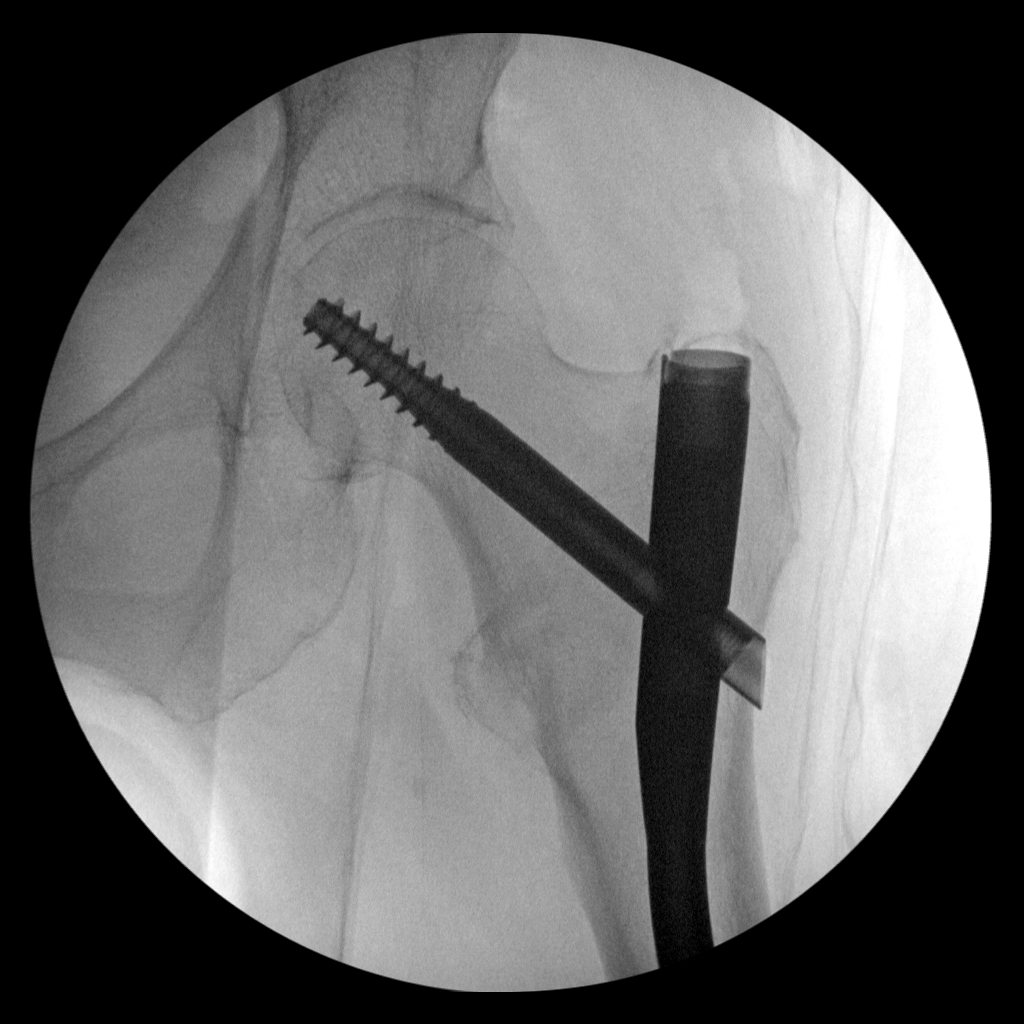
[im 3/4]
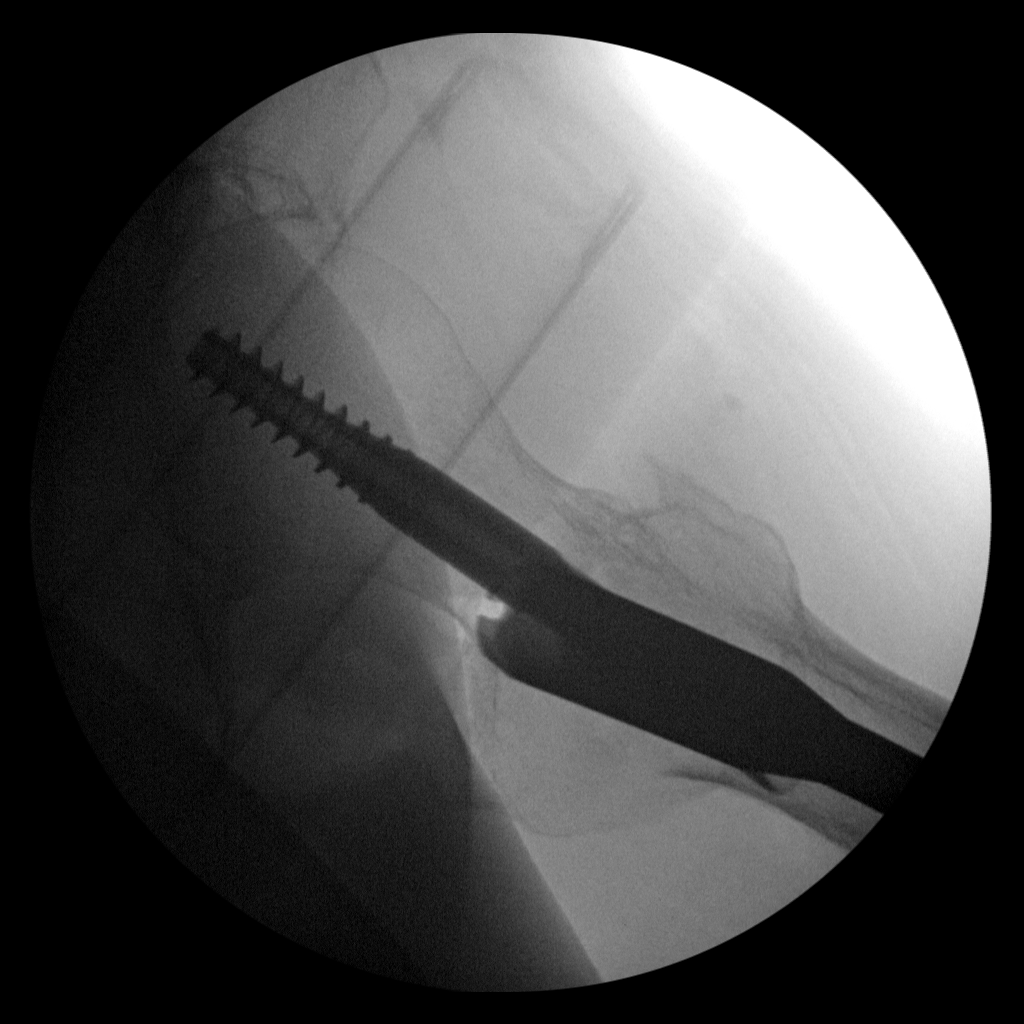
[im 4/4]
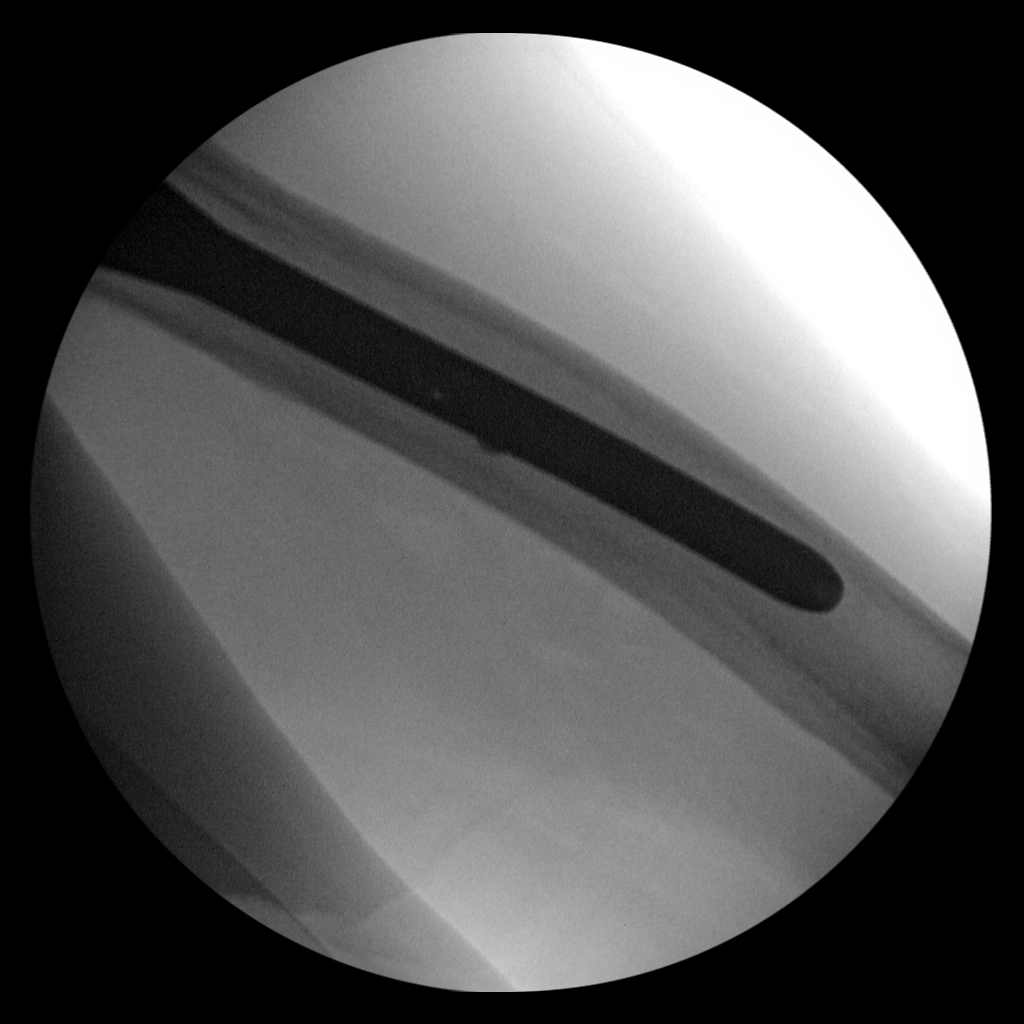

[4 of 4 positions shown; findings below may reference images not displayed]

FINDINGS: Intraoperative fluoroscopy shows placement of a femoral nail with
dynamic hip screw. There is anatomic alignment at the patient's
intertrochanteric femur fracture. No evidence of intraoperative
fracture.
IMPRESSION: Fluoroscopy for intertrochanteric femur fracture ORIF. No unexpected
finding.

## 2017-10-25 ENCOUNTER — Inpatient Hospital Stay (HOSPITAL_COMMUNITY): Payer: Medicare Other

## 2017-10-25 ENCOUNTER — Inpatient Hospital Stay (HOSPITAL_COMMUNITY)
Admission: EM | Admit: 2017-10-25 | Discharge: 2017-10-31 | DRG: 918 | Disposition: A | Discharge status: Home / Self Care | Payer: Medicare Other | Attending: Family Medicine | Admitting: Family Medicine

## 2017-10-25 ENCOUNTER — Emergency Department (HOSPITAL_COMMUNITY): Payer: Medicare Other

## 2017-10-25 ENCOUNTER — Encounter (HOSPITAL_COMMUNITY): Payer: Self-pay

## 2017-10-25 DIAGNOSIS — Z87891 Personal history of nicotine dependence: Secondary | ICD-10-CM

## 2017-10-25 DIAGNOSIS — R400 Somnolence: Secondary | ICD-10-CM | POA: Diagnosis not present

## 2017-10-25 DIAGNOSIS — I10 Essential (primary) hypertension: Secondary | ICD-10-CM | POA: Diagnosis present

## 2017-10-25 DIAGNOSIS — G6289 Other specified polyneuropathies: Secondary | ICD-10-CM | POA: Diagnosis not present

## 2017-10-25 DIAGNOSIS — M810 Age-related osteoporosis without current pathological fracture: Secondary | ICD-10-CM | POA: Diagnosis present

## 2017-10-25 DIAGNOSIS — Z881 Allergy status to other antibiotic agents status: Secondary | ICD-10-CM | POA: Diagnosis not present

## 2017-10-25 DIAGNOSIS — Z882 Allergy status to sulfonamides status: Secondary | ICD-10-CM

## 2017-10-25 DIAGNOSIS — T1491XA Suicide attempt, initial encounter: Secondary | ICD-10-CM | POA: Diagnosis not present

## 2017-10-25 DIAGNOSIS — Z7982 Long term (current) use of aspirin: Secondary | ICD-10-CM | POA: Diagnosis not present

## 2017-10-25 DIAGNOSIS — G8929 Other chronic pain: Secondary | ICD-10-CM | POA: Diagnosis present

## 2017-10-25 DIAGNOSIS — T50902A Poisoning by unspecified drugs, medicaments and biological substances, intentional self-harm, initial encounter: Secondary | ICD-10-CM | POA: Diagnosis not present

## 2017-10-25 DIAGNOSIS — T50901A Poisoning by unspecified drugs, medicaments and biological substances, accidental (unintentional), initial encounter: Secondary | ICD-10-CM

## 2017-10-25 DIAGNOSIS — T402X1A Poisoning by other opioids, accidental (unintentional), initial encounter: Principal | ICD-10-CM | POA: Diagnosis present

## 2017-10-25 DIAGNOSIS — Z79899 Other long term (current) drug therapy: Secondary | ICD-10-CM

## 2017-10-25 DIAGNOSIS — R4182 Altered mental status, unspecified: Secondary | ICD-10-CM | POA: Diagnosis present

## 2017-10-25 DIAGNOSIS — R413 Other amnesia: Secondary | ICD-10-CM | POA: Diagnosis present

## 2017-10-25 DIAGNOSIS — T50904A Poisoning by unspecified drugs, medicaments and biological substances, undetermined, initial encounter: Secondary | ICD-10-CM | POA: Diagnosis not present

## 2017-10-25 DIAGNOSIS — E785 Hyperlipidemia, unspecified: Secondary | ICD-10-CM | POA: Diagnosis present

## 2017-10-25 DIAGNOSIS — G629 Polyneuropathy, unspecified: Secondary | ICD-10-CM | POA: Diagnosis present

## 2017-10-25 DIAGNOSIS — E871 Hypo-osmolality and hyponatremia: Secondary | ICD-10-CM | POA: Diagnosis not present

## 2017-10-25 DIAGNOSIS — T424X1A Poisoning by benzodiazepines, accidental (unintentional), initial encounter: Secondary | ICD-10-CM | POA: Diagnosis present

## 2017-10-25 DIAGNOSIS — M1990 Unspecified osteoarthritis, unspecified site: Secondary | ICD-10-CM | POA: Diagnosis present

## 2017-10-25 DIAGNOSIS — Z888 Allergy status to other drugs, medicaments and biological substances status: Secondary | ICD-10-CM | POA: Diagnosis not present

## 2017-10-25 DIAGNOSIS — Z79891 Long term (current) use of opiate analgesic: Secondary | ICD-10-CM

## 2017-10-25 DIAGNOSIS — T502X5A Adverse effect of carbonic-anhydrase inhibitors, benzothiadiazides and other diuretics, initial encounter: Secondary | ICD-10-CM | POA: Diagnosis not present

## 2017-10-25 DIAGNOSIS — T39314A Poisoning by propionic acid derivatives, undetermined, initial encounter: Secondary | ICD-10-CM | POA: Diagnosis not present

## 2017-10-25 DIAGNOSIS — T424X2A Poisoning by benzodiazepines, intentional self-harm, initial encounter: Secondary | ICD-10-CM | POA: Diagnosis not present

## 2017-10-25 DIAGNOSIS — F418 Other specified anxiety disorders: Secondary | ICD-10-CM | POA: Diagnosis present

## 2017-10-25 DIAGNOSIS — F332 Major depressive disorder, recurrent severe without psychotic features: Secondary | ICD-10-CM | POA: Diagnosis not present

## 2017-10-25 LAB — URINALYSIS, ROUTINE W REFLEX MICROSCOPIC
Bilirubin Urine: NEGATIVE
GLUCOSE, UA: NEGATIVE mg/dL
HGB URINE DIPSTICK: NEGATIVE
Ketones, ur: NEGATIVE mg/dL
Leukocytes, UA: NEGATIVE
Nitrite: NEGATIVE
PROTEIN: NEGATIVE mg/dL
SPECIFIC GRAVITY, URINE: 1.014 (ref 1.005–1.030)
pH: 5 (ref 5.0–8.0)

## 2017-10-25 LAB — CBC WITH DIFFERENTIAL/PLATELET
BASOS PCT: 0 %
Basophils Absolute: 0 10*3/uL (ref 0.0–0.1)
Eosinophils Absolute: 0.4 10*3/uL (ref 0.0–0.7)
Eosinophils Relative: 5 %
HEMATOCRIT: 35.1 % — AB (ref 36.0–46.0)
HEMOGLOBIN: 11 g/dL — AB (ref 12.0–15.0)
LYMPHS ABS: 2 10*3/uL (ref 0.7–4.0)
LYMPHS PCT: 23 %
MCH: 27.4 pg (ref 26.0–34.0)
MCHC: 31.3 g/dL (ref 30.0–36.0)
MCV: 87.3 fL (ref 78.0–100.0)
MONO ABS: 0.9 10*3/uL (ref 0.1–1.0)
Monocytes Relative: 10 %
NEUTROS ABS: 5.4 10*3/uL (ref 1.7–7.7)
Neutrophils Relative %: 62 %
Platelets: 249 10*3/uL (ref 150–400)
RBC: 4.02 MIL/uL (ref 3.87–5.11)
RDW: 14 % (ref 11.5–15.5)
WBC: 8.8 10*3/uL (ref 4.0–10.5)

## 2017-10-25 LAB — TROPONIN I: Troponin I: <0.03 ng/mL (ref <0.03)

## 2017-10-25 LAB — COMPREHENSIVE METABOLIC PANEL
ALBUMIN: 3.2 g/dL — AB (ref 3.5–5.0)
ALK PHOS: 62 U/L (ref 38–126)
ALT: 12 U/L — ABNORMAL LOW (ref 14–54)
AST: 24 U/L (ref 15–41)
Anion gap: 6 (ref 5–15)
BILIRUBIN TOTAL: 0.3 mg/dL (ref 0.3–1.2)
BUN: 13 mg/dL (ref 6–20)
CALCIUM: 8.7 mg/dL — AB (ref 8.9–10.3)
CO2: 28 mmol/L (ref 22–32)
Chloride: 105 mmol/L (ref 101–111)
Creatinine, Ser: 0.94 mg/dL (ref 0.44–1.00)
GFR calc Af Amer: >60 mL/min (ref >60)
GFR, EST NON AFRICAN AMERICAN: 55 mL/min — AB (ref >60)
GLUCOSE: 108 mg/dL — AB (ref 65–99)
Potassium: 3.6 mmol/L (ref 3.5–5.1)
Sodium: 139 mmol/L (ref 135–145)
TOTAL PROTEIN: 5.3 g/dL — AB (ref 6.5–8.1)

## 2017-10-25 LAB — I-STAT VENOUS BLOOD GAS, ED
Acid-Base Excess: 2 mmol/L (ref 0.0–2.0)
Bicarbonate: 29.2 mmol/L — ABNORMAL HIGH (ref 20.0–28.0)
O2 SAT: 38 %
PCO2 VEN: 55.9 mmHg (ref 44.0–60.0)
PO2 VEN: 25 mmHg — AB (ref 32.0–45.0)
TCO2: 31 mmol/L (ref 22–32)
pH, Ven: 7.326 (ref 7.250–7.430)

## 2017-10-25 LAB — I-STAT TROPONIN, ED: Troponin i, poc: 0 ng/mL (ref 0.00–0.08)

## 2017-10-25 LAB — CARBOXYHEMOGLOBIN - COOX: CARBOXYHEMOGLOBIN: 0.6 % (ref 0.5–1.5)

## 2017-10-25 LAB — PROTIME-INR
INR: 1.27
Prothrombin Time: 15.8 seconds — ABNORMAL HIGH (ref 11.4–15.2)

## 2017-10-25 LAB — SALICYLATE LEVEL

## 2017-10-25 LAB — ACETAMINOPHEN LEVEL

## 2017-10-25 LAB — ETHANOL

## 2017-10-25 LAB — LACTIC ACID, PLASMA
LACTIC ACID, VENOUS: 1.4 mmol/L (ref 0.5–1.9)
Lactic Acid, Venous: 1.2 mmol/L (ref 0.5–1.9)

## 2017-10-25 LAB — CBG MONITORING, ED: Glucose-Capillary: 89 mg/dL (ref 65–99)

## 2017-10-25 LAB — RAPID URINE DRUG SCREEN, HOSP PERFORMED
AMPHETAMINES: NOT DETECTED
Barbiturates: NOT DETECTED
Benzodiazepines: POSITIVE — AB
Cocaine: NOT DETECTED
OPIATES: POSITIVE — AB
TETRAHYDROCANNABINOL: NOT DETECTED

## 2017-10-25 LAB — CK: CK TOTAL: 80 U/L (ref 38–234)

## 2017-10-25 MED ORDER — THIAMINE HCL 100 MG/ML IJ SOLN
100.0000 mg | Freq: Every day | INTRAMUSCULAR | Status: DC
  Administered 2017-10-26 – 2017-10-28 (×4): 100 mg via INTRAVENOUS
  Filled 2017-10-25 (×4): qty 2

## 2017-10-25 MED ORDER — NALOXONE HCL 0.4 MG/ML IJ SOLN
0.4000 mg | Freq: Once | INTRAMUSCULAR | Status: AC
  Administered 2017-10-25: 0.4 mg via INTRAVENOUS
  Filled 2017-10-25: qty 1

## 2017-10-25 MED ORDER — POLYETHYLENE GLYCOL 3350 17 G PO PACK: 17.0000 g | PACK | Freq: Every day | ORAL | Status: DC | PRN

## 2017-10-25 MED ORDER — LORAZEPAM 2 MG/ML IJ SOLN
2.0000 mg | INTRAMUSCULAR | Status: DC | PRN
  Administered 2017-10-28 – 2017-10-29 (×3): 2 mg via INTRAVENOUS
  Filled 2017-10-25 (×3): qty 1

## 2017-10-25 MED ORDER — FOLIC ACID 5 MG/ML IJ SOLN
1.0000 mg | Freq: Every day | INTRAMUSCULAR | Status: DC
  Administered 2017-10-26 – 2017-10-28 (×3): 1 mg via INTRAVENOUS
  Filled 2017-10-25 (×6): qty 0.2

## 2017-10-25 MED ORDER — LEVOFLOXACIN IN D5W 750 MG/150ML IV SOLN
750.0000 mg | INTRAVENOUS | Status: DC
  Administered 2017-10-25 – 2017-10-27 (×3): 750 mg via INTRAVENOUS
  Filled 2017-10-25 (×4): qty 150

## 2017-10-25 MED ORDER — ACETAMINOPHEN 325 MG PO TABS
650.0000 mg | ORAL_TABLET | Freq: Four times a day (QID) | ORAL | Status: DC | PRN
  Administered 2017-10-28 – 2017-10-31 (×5): 650 mg via ORAL
  Filled 2017-10-25 (×6): qty 2

## 2017-10-25 MED ORDER — ENOXAPARIN SODIUM 40 MG/0.4ML ~~LOC~~ SOLN
40.0000 mg | SUBCUTANEOUS | Status: DC
  Administered 2017-10-26 (×2): 40 mg via SUBCUTANEOUS
  Filled 2017-10-25 (×5): qty 0.4

## 2017-10-25 MED ORDER — ACETAMINOPHEN 650 MG RE SUPP: 650.0000 mg | Freq: Four times a day (QID) | RECTAL | Status: DC | PRN

## 2017-10-25 MED ORDER — DEXTROSE-NACL 5-0.45 % IV SOLN
INTRAVENOUS | Status: DC
  Administered 2017-10-25 – 2017-10-26 (×2): via INTRAVENOUS

## 2017-10-25 NOTE — ED Triage Notes (Signed)
Pt brought in by EMS due having AMS. Per EMS, pt wasn't responding appropriately. Pt was found by family member with prescription pills and wine bottle in bed. Pt will open eyes when spoken to, but will not talk.

## 2017-10-25 NOTE — ED Provider Notes (Signed)
[facility] EMERGENCY DEPARTMENT Provider Note   CSN: [id number] Arrival date & time: 10/25/17  1040     History   Chief Complaint Chief Complaint  Patient presents with  . Altered Mental Status    HPI Azyriah ARACELIA BRINSON is a 83 y.o. female.  HPI  83yo female with a history of chronic pain, remote pulmonary embolus in 1967 not on anticoagulation, presents with concern for altered mental status after being found in bed with pill bottles.  Discussed with grandson, [name] been case, who reports he is suspicious that she drank a bottle of wine, as he found one in the kitchen, and that EMS had found several medications in bed with her.  He reports he saw her last night, and she was in normal state of health, sitting up eating dinner, without acute concerns, including no cough, fever or other abnormalities.  Reports that last night he had arranged to have breakfast with her, however when he went to the house this morning he found her altered, laying in bed surrounded by pill bottles.   Reports she had Xanax 3 days ago refilled, and they only found 4 pills on the floor.  Trazodone was in a bag in the bed.  Oxycodone bottle was also found.   Concern she either misses he rmedications or takes too many  No known history of SI or overdose    Past Medical History:  Diagnosis Date  . Chronic back pain    with degenerative joint disease  . Chronic pain disorder    with narcotic management  . Dry eyes   . Hypertension   . Osteoporosis   . PE (pulmonary embolism)    After hysterectomy in 1967  . Pneumonia     Patient Active Problem List   Diagnosis Date Noted  . Altered mental status 10/25/2017  . Closed displaced intertrochanteric fracture of left femur ([place]) 02/16/2017  . Hip fracture, right ([place]) 12/25/2015  . Chronic pain 12/24/2015  . Fracture of femoral neck, right, closed ([place]) 12/24/2015  . Essential hypertension 12/24/2015  . Community acquired pneumonia  04/15/2013  . Hypokalemia 04/15/2013  . Labial cyst 02/08/2012  . POSTMENOPAUSAL SYNDROME 11/28/2009  . PERIPHERAL NEUROPATHY, LOWER EXTREMITY, RIGHT 02/22/2009  . INSOMNIA-SLEEP DISORDER-UNSPEC 10/12/2008  . Lumbago 08/02/2008  . 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 ANXIETY DEPRESSION 12/16/2007  . HYPERLIPIDEMIA 07/08/2007  . Osteoarthritis 07/08/2007  . Osteoporosis 07/08/2007  . SCOLIOSIS NEC 07/08/2007    Past Surgical History:  Procedure Laterality Date  . ABDOMINAL HYSTERECTOMY    . APPENDECTOMY    . BREAST ENHANCEMENT SURGERY    . FEMUR IM NAIL Left 02/17/2017   Procedure: INTRAMEDULLARY (IM) NAIL FEMORAL;  Surgeon: Nicholes Stairs, MD;  Location: [facility];  Service: Orthopedics;  Laterality: Left;  . HIP ARTHROPLASTY Right 12/24/2015   Procedure: ARTHROPLASTY BIPOLAR HIP (HEMIARTHROPLASTY);  Surgeon: Netta Cedars, MD;  Location: [facility];  Service: Orthopedics;  Laterality: Right;  . TONSILLECTOMY      OB History    No data available       Home Medications    Prior to Admission medications   Medication Sig Start Date End Date Taking? Authorizing Provider  ALPRAZolam Duanne Moron) 1 MG tablet Take 1 mg by mouth at bedtime as needed for sleep.    Yes [provider]  aspirin EC 81 MG tablet Take 1 tablet (81 mg total) by mouth 2 (two) times daily. Patient taking differently: Take 81 mg by mouth  daily.  02/19/17  Yes Bonnielee Haff, MD  escitalopram (LEXAPRO) 20 MG tablet Take 20 mg by mouth daily. 10/15/17  Yes [provider]  furosemide (LASIX) 20 MG tablet Take 20 mg by mouth daily.  10/15/17  Yes [provider]  oxycodone (ROXICODONE) 30 MG immediate release tablet Take 1 tablet (30 mg total) by mouth every 6 (six) hours as needed (severe pain). Patient taking differently: Take 30 mg by mouth 3 (three) times daily as needed (severe pain).  02/19/17  Yes Bonnielee Haff, MD  traZODone (DESYREL) 50 MG tablet Take 50 mg by mouth at  bedtime as needed for sleep. 10/15/17  Yes [provider]  fentaNYL (DURAGESIC - DOSED MCG/HR) 25 MCG/HR patch Place 1 patch (25 mcg total) onto the skin every 3 (three) days. Patient not taking: Reported on 10/25/2017 02/19/17   Bonnielee Haff, MD    Family History Family History  Problem Relation Age of Onset  . Heart Problems Mother     Social History Social History  Substance Use Topics  . Smoking status: Former Smoker    Years: 13.00    Quit date: 04/15/1976  . Smokeless tobacco: Never Used  . Alcohol use No     Allergies   Amoxicillin-pot clavulanate; Lisinopril-hydrochlorothiazide; and Sulfonamide derivatives   Review of Systems Review of Systems  Unable to perform ROS: Mental status change     Physical Exam Updated Vital Signs BP (!) 96/47 (BP Location: Right Arm)   Pulse 75   Temp 98.2 F (36.8 C) (Oral)   Resp 15   SpO2 97%   Physical Exam  Constitutional: She appears well-developed and well-nourished. She appears listless. No distress.  HENT:  Head: Normocephalic and atraumatic.  Eyes: Conjunctivae and EOM are normal.  Bilateral irregular shape of pupils  Neck: Normal range of motion.  Cardiovascular: Normal rate, regular rhythm, normal heart sounds and intact distal pulses.  Exam reveals no gallop and no friction rub.   No murmur heard. Pulmonary/Chest: Effort normal and breath sounds normal. No respiratory distress. She has no wheezes. She has no rales.  Abdominal: Soft. She exhibits no distension. There is no tenderness. There is no guarding.  Musculoskeletal: She exhibits no edema or tenderness.  Neurological: She appears listless. GCS eye subscore is 3. GCS verbal subscore is 4. GCS motor subscore is 6.  Answers yes, no, follows commands with encouragement Sleeping, doesn't answer orientation questions or history questions right now Strength appears normal bilaterally, will not answer to sensation or participate in coordination When  asked to smile states no, but grimace symmetric  Skin: Skin is warm and dry. No rash noted. She is not diaphoretic. No erythema.  Nursing note and vitals reviewed.    ED Treatments / Results  Labs (all labs ordered are listed, but only abnormal results are displayed) Labs Reviewed  CBC WITH DIFFERENTIAL/PLATELET - Abnormal; Notable for the following:       Result Value   Hemoglobin 11.0 (*)    HCT 35.1 (*)    All other components within normal limits  COMPREHENSIVE METABOLIC PANEL - Abnormal; Notable for the following:    Glucose, Bld 108 (*)    Calcium 8.7 (*)    Total Protein 5.3 (*)    Albumin 3.2 (*)    ALT 12 (*)    GFR calc non Af Amer 55 (*)    All other components within normal limits  ACETAMINOPHEN LEVEL - Abnormal; Notable for the following:    Acetaminophen (  Tylenol), Serum <10 (*)    All other components within normal limits  RAPID URINE DRUG SCREEN, HOSP PERFORMED - Abnormal; Notable for the following:    Opiates POSITIVE (*)    Benzodiazepines POSITIVE (*)    All other components within normal limits  PROTIME-INR - Abnormal; Notable for the following:    Prothrombin Time 15.8 (*)    All other components within normal limits  SALICYLATE LEVEL  ETHANOL  URINALYSIS, ROUTINE W REFLEX MICROSCOPIC  CARBOXYHEMOGLOBIN - COOX  CK  LACTIC ACID, PLASMA  LACTIC ACID, PLASMA  BLOOD GAS, VENOUS  I-STAT TROPONIN, ED  CBG MONITORING, ED    EKG  EKG Interpretation  Date/Time:  Saturday October 25 2017 11:43:28 EDT Ventricular Rate:  77 PR Interval:    QRS Duration: 95 QT Interval:  378 QTC Calculation: 428 R Axis:   20 Text Interpretation:  Sinus rhythm Low voltage, precordial leads Abnormal R-wave progression, early transition No significant change since prior ECG Jul 31 Confirmed by Gareth Morgan [id number]) on 10/25/2017 5:58:47 PM       Radiology Dg Chest Portable 1 View  Result Date: 10/25/2017 CLINICAL DATA:  Altered mental status. EXAM: PORTABLE  CHEST 1 VIEW COMPARISON:  07/29/2017 FINDINGS: The cardiomediastinal silhouette is unchanged. Aortic eft cirrhosis is noted. The lungs remain hypoinflated with increased patchy opacity in the left lung base. No sizable pleural effusion or pneumothorax is identified. There is an old fracture of the proximal left humerus. Moderate S-shaped thoracolumbar scoliosis is noted. IMPRESSION: Low lung volumes with increasing left basilar opacity which may reflect atelectasis, pneumonia, or aspiration. Electronically Signed   By: Logan Bores M.D.   On: 10/25/2017 12:18    Procedures Procedures (including critical care time)  Medications Ordered in ED Medications  naloxone [facility]) injection 0.4 mg (0.4 mg Intravenous Given 10/25/17 1208)  naloxone [facility]) injection 0.4 mg (0.4 mg Intravenous Given 10/25/17 1238)  CRITICAL CARE: altered mental status, OD require reevaluation, narcan Performed by: Tennis Must   Total critical care time: 30 minutes  Critical care time was exclusive of separately billable procedures and treating other patients.  Critical care was necessary to treat or prevent imminent or life-threatening deterioration.  Critical care was time spent personally by me on the following activities: development of treatment plan with patient and/or surrogate as well as nursing, discussions with consultants, evaluation of patient's response to treatment, examination of patient, obtaining history from patient or surrogate, ordering and performing treatments and interventions, ordering and review of laboratory studies, ordering and review of radiographic studies, pulse oximetry and re-evaluation of patient's condition.    Initial Impression / Assessment and Plan / ED Course  I have reviewed the triage vital signs and the nursing notes.  Pertinent labs & imaging results that were available during my care of the patient were reviewed by me and considered in my medical decision making (see  chart for details).      83yo female with a history of chronic pain, remote pulmonary embolus in 1967 not on anticoagulation, presents with concern for altered mental status after being found in bed with pill bottles.  Patient sleepy, however following commands and protecting her airway on arrival.  Her neurologic exam is nonfocal, and given history of being found with pill bottles, have low suspicion for CVA.  By history, suspect most likely medication misuse or overdose.  EKG shows no acute findings.  Tylenol, salicylate and alcohol levels show no significant findings.   Patient with decrease in  responsiveness and blood pressure which responded to narcan.  She became sleepy and was given more narcan with slight improvement but still sleepy. Do not feel narcan drip indicated given patient with adequate respirations, multi-drug overdose.  Pt appropriate for admission for continued care.   Final Clinical Impressions(s) / ED Diagnoses   Final diagnoses:  Somnolence  Drug overdose, undetermined intent, initial encounter    New Prescriptions New Prescriptions   No medications on file     Gareth Morgan, MD 10/25/17 1800

## 2017-10-25 NOTE — ED Notes (Signed)
Pt attempted to get up. Pt stopped by staff and placed back into bed. Considered high fall risk. [place] bed alarm placed. Pt resting comfortably in bed

## 2017-10-25 NOTE — H&P (Signed)
[facility] Admission History and Physical Service Pager: [phone]  Patient name: Angelica Bell Medical record number: [id number] Date of birth: Apr 14, 1932 Age: 83 y.o. Gender: female  Primary Care Provider: Heywood Bene, PA-C Consultants: Poison control  Code Status: full (patient with AMS on admission. Grandson claim to be HCPOA but doesn't have paper)  Chief Complaint: Altered mental status  Assessment and Plan: Angelica Bell is a 83 y.o. female presenting with altered mental status . PMH is significant for hypertension, remote history of PE not on anticoagulation, anxiety, depression, osteoporosis with right femoral neck fracture, peripheral neuropathy, osteoarthritis & chronic pain.   Altered mental status: Somnolent but opens eyes in response to her name. Oriented to self. She is protecting her airway. Altered mental status likely due to overdose with benzo and opiates based on history and UDS. CBG within normal limits. No focal neuro findings to suspect CVA but neuro exam limited due to patient's AMS. Tadpole pupils bilaterally that are minimally reactive to light. She has no clonus. Patellar reflexes 1+ bilaterally. Biceps reflexes 2+ bilaterally.  She has mild rigidity in her 4 extremities but without clonus. Serotonin syndrome considered but unlikely with isolated mild rigidity. Doubt infectious etiology without fever or leukocytosis. However, CXR with some opacities over her left lower lobe concerning for pneumonia. Discussed patient with poison control. Recommended monitoring for seizure, CK and supportive management. Economic, salicylate, Tylenol level, CK, lactic acid and carboxyhemoglobin already within normal limits.  -Admit to stepdown. Attending Dr. Ardelia Mems -Obtain EEG  -Hold sedating medications -Seizure precautions -CIWA protocol -Cardiac monitoring 24 hours -Cycle troponin 2. Negative in ED -Oxygen as needed -We will consider  CT head if no improvement or new neuro findings -Nothing by mouth pending improvement in mental status -Bedside RN swallow eval  ? Pneumonia: CXR with some opacities over her left lower lobe concerning for pneumonia. She has no respiratory symptoms. Satting okay on room air -We will start Levaquin. No QTc prolongation  Hypertension: Currently normotensive -We'll continue monitoring  Remote history of PE in 1967. Not on anticoagulation. PE well's score 1.5 (prior history of PE). DVT unlikely based on neutrophils total lymphocyte ratio and platelet to lymphocyte ratio  Anxiety/depression: Since she is on Lexapro, Trazodone and Xanax at home. Likely with the next overdose now -Hold home medications -CIWA protocol  Osteoarthritis/chronic pain: Since she is on oxycodone and fentanyl patch at home -Hold all medications pending improvement in mental status -IV fentanyl 25 g q3h when necessary  Osteoporosis with history of femoral neck fracture:  -Outpatient   FEN/GI: -Nothing by mouth -IV 5D1/2NS@75   Prophylaxis: -Lovenox  Disposition: Admit to stepdown units for evaluation and management of altered mental status  History of Present Illness:  Angelica Bell is a 83 y.o. female presenting with altered mental status.  Patient was not awake enough to provide history. History based on collateral information from grandson, Angelica Bell. Per Mr. Bell, patient was in her usual state of health when he saw her last night about 7:15 PM. At that time, she ate her dinner and had no acute concerns or symptoms. When he came back to her house this morning to eat breakfast with her, he found her altered and laying in bed surrounded by her medication bottles. He states that there were only 4 pills of Xanax left in her bottle. He says she filled xanax about 4 days ago. He also noted a glass full of wine on the table. He states  they are quite few pills of trazodone and oxycodone bottles but doesn't  remember the exact number. He says he is not at her house right now to look into her bottles.  Off note, Mr. Bell claims to be her HCPOA. However, he doesn't have a paper yet. As far as he knows, patient is full code although she was DNR at one point in her previous admission  ED course: Very somnolent on arrival. He was given Narcan twice and briefly woke up. Vitals within normal limits. CMP and remarkable. CBC with differential with hemoglobin to 11.0 otherwise normal. UDS with benzo and opiates. Tylenol, salicylate and alcohol levels negative. Urinalysis negative. Troponin negative. EKG with low voltage QRS and PRWP. CXR with low lung volumes and increased left basilar opacity concerning for atelectasis/pneumonia/aspiration.  Family medicineto admit patient for some mental status.   ROS Not able to obtain review of systems due to patient's mental status  Patient Active Problem List   Diagnosis Date Noted  . Closed displaced intertrochanteric fracture of left femur ([place]) 02/16/2017  . Hip fracture, right ([place]) 12/25/2015  . Chronic pain 12/24/2015  . Fracture of femoral neck, right, closed ([place]) 12/24/2015  . Essential hypertension 12/24/2015  . Community acquired pneumonia 04/15/2013  . Hypokalemia 04/15/2013  . Labial cyst 02/08/2012  . POSTMENOPAUSAL SYNDROME 11/28/2009  . PERIPHERAL NEUROPATHY, LOWER EXTREMITY, RIGHT 02/22/2009  . INSOMNIA-SLEEP DISORDER-UNSPEC 10/12/2008  . Lumbago 08/02/2008  . 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 ANXIETY DEPRESSION 12/16/2007  . HYPERLIPIDEMIA 07/08/2007  . Osteoarthritis 07/08/2007  . Osteoporosis 07/08/2007  . SCOLIOSIS NEC 07/08/2007    Past Medical History: Past Medical History:  Diagnosis Date  . Chronic back pain    with degenerative joint disease  . Chronic pain disorder    with narcotic management  . Dry eyes   . Hypertension   . Osteoporosis   . PE (pulmonary embolism)    After hysterectomy in 1967  .  Pneumonia     Past Surgical History: Past Surgical History:  Procedure Laterality Date  . ABDOMINAL HYSTERECTOMY    . APPENDECTOMY    . BREAST ENHANCEMENT SURGERY    . FEMUR IM NAIL Left 02/17/2017   Procedure: INTRAMEDULLARY (IM) NAIL FEMORAL;  Surgeon: Nicholes Stairs, MD;  Location: [facility];  Service: Orthopedics;  Laterality: Left;  . HIP ARTHROPLASTY Right 12/24/2015   Procedure: ARTHROPLASTY BIPOLAR HIP (HEMIARTHROPLASTY);  Surgeon: Netta Cedars, MD;  Location: [facility];  Service: Orthopedics;  Laterality: Right;  . TONSILLECTOMY      Social History: Social History  Substance Use Topics  . Smoking status: Former Smoker    Years: 13.00    Quit date: 04/15/1976  . Smokeless tobacco: Never Used  . Alcohol use No   Additional social history: Please also refer to relevant sections of EMR.  Family History: Family History  Problem Relation Age of Onset  . Heart Problems Mother    (If not completed, MUST add something in)  Allergies and Medications: Allergies  Allergen Reactions  . Amoxicillin-Pot Clavulanate Hives and Diarrhea    Has patient had a PCN reaction causing immediate rash, facial/tongue/throat swelling, SOB or lightheadedness with hypotension: yes Has patient had a PCN reaction causing severe rash involving mucus membranes or skin necrosis: yes Has patient had a PCN reaction that required hospitalization : unknown Has patient had a PCN reaction occurring within the last 10 years: yes If all of the above answers are "NO", then may proceed with Cephalosporin use.   Marland Kitchen  Lisinopril-Hydrochlorothiazide Other (See Comments)    Tired- no energy to do anything..  . Sulfonamide Derivatives Hives   No current facility-administered medications on file prior to encounter.    Current Outpatient Prescriptions on File Prior to Encounter  Medication Sig Dispense Refill  . ALPRAZolam (XANAX) 1 MG tablet Take 1 mg by mouth at bedtime as needed for sleep.     Marland Kitchen aspirin EC 81  MG tablet Take 1 tablet (81 mg total) by mouth 2 (two) times daily. (Patient taking differently: Take 81 mg by mouth daily. )    . oxycodone (ROXICODONE) 30 MG immediate release tablet Take 1 tablet (30 mg total) by mouth every 6 (six) hours as needed (severe pain). (Patient taking differently: Take 30 mg by mouth 3 (three) times daily as needed (severe pain). ) 120 tablet 0  . fentaNYL (DURAGESIC - DOSED MCG/HR) 25 MCG/HR patch Place 1 patch (25 mcg total) onto the skin every 3 (three) days. (Patient not taking: Reported on 10/25/2017) 5 patch 0    Objective: BP (!) 161/81   Pulse 78   Temp 98.2 F (36.8 C) (Oral)   Resp (!) 23   SpO2 (!) 80%  Exam: GEN: Frail elderly lady lying on bed, no acute distress, opens her eyes in response to her name Head: normocephalic and atraumatic  Eyes: Tadpole pupils bilaterally, mildly reactive to light bilaterally Nares: no rhinorrhea, congestion or erythema  Oropharynx: No dentition HEM: negative for cervical or periauricular lymphadenopathies CVS: RRR, nl s1 & s2, no murmurs, no edema RESP: no IWOB, good air movement bilaterally, CTAB anteriorly GI: BS present & normal, soft, NTND, no guarding, no rebound, no mass GU: no suprapubic tenderness MSK: no focal tenderness or notable swelling SKIN: no apparent skin lesion NEURO: Somnolent, opens her eyes in response to sound, oriented to self. Pupils mildly reactive bilaterally, no facial drooping. Some rigidity in her extremities. No clonus. Biceps reflex 2+ bilaterally. Patellar reflexes 1+ bilaterally. Further neuro evaluation not possible due to patient's mental status  Labs and Imaging: CBC BMET   Recent Labs Lab 10/25/17 1144  WBC 8.8  HGB 11.0*  HCT 35.1*  PLT 249    Recent Labs Lab 10/25/17 1144  NA 139  K 3.6  CL 105  CO2 28  BUN 13  CREATININE 0.94  GLUCOSE 108*  CALCIUM 8.7*    Dg Chest Portable 1 View  Result Date: 10/25/2017 CLINICAL DATA:  Altered mental status. EXAM:  PORTABLE CHEST 1 VIEW COMPARISON:  07/29/2017 FINDINGS: The cardiomediastinal silhouette is unchanged. Aortic eft cirrhosis is noted. The lungs remain hypoinflated with increased patchy opacity in the left lung base. No sizable pleural effusion or pneumothorax is identified. There is an old fracture of the proximal left humerus. Moderate S-shaped thoracolumbar scoliosis is noted. IMPRESSION: Low lung volumes with increasing left basilar opacity which may reflect atelectasis, pneumonia, or aspiration. Electronically Signed   By: Logan Bores M.D.   On: 10/25/2017 12:18    Mercy Riding, MD 10/25/2017, 1:58 PM PGY-3, [place] Intern pager: [phone], text pages welcome

## 2017-10-26 ENCOUNTER — Encounter (HOSPITAL_COMMUNITY): Payer: Self-pay | Admitting: *Deleted

## 2017-10-26 DIAGNOSIS — Z79899 Other long term (current) drug therapy: Secondary | ICD-10-CM

## 2017-10-26 DIAGNOSIS — T50901A Poisoning by unspecified drugs, medicaments and biological substances, accidental (unintentional), initial encounter: Secondary | ICD-10-CM

## 2017-10-26 LAB — COMPREHENSIVE METABOLIC PANEL
ALT: 11 U/L — ABNORMAL LOW (ref 14–54)
AST: 23 U/L (ref 15–41)
Albumin: 3.1 g/dL — ABNORMAL LOW (ref 3.5–5.0)
Alkaline Phosphatase: 66 U/L (ref 38–126)
Anion gap: 11 (ref 5–15)
BILIRUBIN TOTAL: 0.6 mg/dL (ref 0.3–1.2)
BUN: 8 mg/dL (ref 6–20)
CHLORIDE: 105 mmol/L (ref 101–111)
CO2: 24 mmol/L (ref 22–32)
CREATININE: 0.75 mg/dL (ref 0.44–1.00)
Calcium: 9 mg/dL (ref 8.9–10.3)
Glucose, Bld: 83 mg/dL (ref 65–99)
POTASSIUM: 3.5 mmol/L (ref 3.5–5.1)
Sodium: 140 mmol/L (ref 135–145)
TOTAL PROTEIN: 5.2 g/dL — AB (ref 6.5–8.1)

## 2017-10-26 LAB — CBC
HCT: 35 % — ABNORMAL LOW (ref 36.0–46.0)
Hemoglobin: 11.3 g/dL — ABNORMAL LOW (ref 12.0–15.0)
MCH: 27.7 pg (ref 26.0–34.0)
MCHC: 32.3 g/dL (ref 30.0–36.0)
MCV: 85.8 fL (ref 78.0–100.0)
PLATELETS: 247 10*3/uL (ref 150–400)
RBC: 4.08 MIL/uL (ref 3.87–5.11)
RDW: 14.2 % (ref 11.5–15.5)
WBC: 8.6 10*3/uL (ref 4.0–10.5)

## 2017-10-26 LAB — TROPONIN I

## 2017-10-26 LAB — MRSA PCR SCREENING: MRSA BY PCR: NEGATIVE

## 2017-10-26 MED ORDER — FENTANYL CITRATE (PF) 100 MCG/2ML IJ SOLN
25.0000 ug | INTRAMUSCULAR | Status: DC | PRN
  Administered 2017-10-27 – 2017-10-30 (×9): 25 ug via INTRAVENOUS
  Filled 2017-10-26 (×9): qty 2

## 2017-10-26 NOTE — Progress Notes (Signed)
Family Medicine Progress Update  Examined Patient. Checked her skin head to toe for fentanyl patches. None found. Switching patient to IV fentanyl as this is easier to titrate to ensure she gets desired amount of morphine equivalents.  Guadalupe Dawn MD PGY-1 Family Medicine Resident

## 2017-10-26 NOTE — Discharge Summary (Signed)
[facility] Discharge Summary  Patient name: Angelica Bell record number: [id number] Date of birth: August 14, 1932 Age: 83 y.o. Gender: female Date of Admission: 10/25/2017  Date of Discharge: 10/31/17 Admitting Physician: Leeanne Rio, MD  Primary Care Provider: Heywood Bene, PA-C Consultants: psych  Indication for Hospitalization: AMS likely due to medication OD  Discharge Diagnoses/Problem List:  Potential Intentional suicide attempt vs accidental medication OD AMS HTN Neuropathic pain Thiazide induce hyponatremia  Disposition: to home as patient refused SNF or home health  Discharge Condition: stable  Discharge Exam: GEN: awake and talkative Head: normocephalic and atraumatic  Nares: no rhinorrhea, congestion or erythema  Oropharynx: No dentition CVS: RRR, nl s1 & s2, no murmurs, no edema RESP: no IWOB, good air movement bilaterally, CTAB anteriorly GI: BS present & normal, MSK: no edema SKIN: no apparent skin lesion NEURO: AOx3 with significant memory deficits.  CN grossly intact.    Brief Hospital Course:  Patient was brought in via EMS after being found with AMS and largely unresponsive by son with a mostly empty bottle of xanex next to her (he later said eh found some of the pills under the bed).  On exam she was only arousable to briefly say her name in the ED.   By morning rounds the next day she was alert and while she had no memory of the event she denied intentional self-harm.  She admitted she gets confused sometimes and often demonstrated short term memory loss during our discussions.  [place] 14.  Psychiatry was contacted to assess competency and found her unwilling to discuss depression or thoughts of self-harm which given her ED presentation was concerning for potential SI.  The suggestion was made to IVC.   The family medicine team had a very different interaction and impression with her and did not believe she was  expressing SI during hospitalization.  Given her variable presentation to two providers we asked for a second psychiatrists opinion and they felt she was not suicidal although she did have significant memory issues that would benefit from home health (she refuses) and her grandson administering her meds (both the patient and grandson agreed).  She was d/c'd on 11/2 after decision was made to not IVC.  Issues for Follow Up:  1. Concern for lexapro contributing to potential intentional suicide (patient consistently denies intentionality of likely medication OD).   Recommend not restarting lexapro. 2. Patient's son agrees to administer patient's medications to prevent accidental overuse or confusion. There are no guns present in her home.   Mocha 14 3. Recommend neuropsychological testing to rule out neurocognitive disorder given short term memory deficits/forgetfulness. (patient tells [place] they have and will refuse) 4. Recommend discontinue Xanax since it can lead to confusion, dementia and falls in the elderly and can be lethal in overdose.  5. Patient would benefit from home health services. They consistently say they have and will refuse. 6. Recommend considering Melatonin to regulate sleep/wake cycle.  7. Out of concern for potential OD and over sedation, we did not continue oxy 30 TID.   Patient was d/c'd on 200 gabapentin TID for her neuropathic pain which she thought worked quite well and a tramadol 50mg  daily for breakthrough pain. 8. Creatinine rose to 1.19 on last day of admission.  Please recheck.  It is likely due to her refusal to eat meals while under suicide precautions because she disliked the plastic silverware. 9. We held lasix initially and when BP rose we  had tried HCTZ for the potential side benefit of preventing osteoporosis, she quickly developed hyponatremia so we recommend not using HCTZ in the future.  Significant Procedures:   Significant Labs and Imaging:   Recent Labs Lab  10/26/17 0219 10/27/17 0338 10/28/17 0401  WBC 8.6 6.1 7.9  HGB 11.3* 10.9* 10.9*  HCT 35.0* 33.9* 33.4*  PLT 247 269 281    Recent Labs Lab 10/26/17 0219 10/27/17 0338 10/28/17 0401 10/30/17 0220 10/31/17 0317  NA 140 141 141 132* 138  K 3.5 3.4* 3.6 3.6 3.4*  CL 105 107 109 99* 103  CO2 24 28 24 24 26   GLUCOSE 83 98 105* 141* 167*  BUN 8 7 6 20  26*  CREATININE 0.75 0.75 0.77 0.82 1.09*  CALCIUM 9.0 9.0 9.4 9.3 9.7  ALKPHOS 66  --   --   --   --   AST 23  --   --   --   --   ALT 11*  --   --   --   --   ALBUMIN 3.1*  --   --   --   --     Ct Head Wo Contrast  Result Date: 10/25/2017 CLINICAL DATA:  Altered mental status.  History of hypertension. EXAM: CT HEAD WITHOUT CONTRAST TECHNIQUE: Contiguous axial images were obtained from the base of the skull through the vertex without intravenous contrast. COMPARISON:  CT HEAD July 29, 2017 FINDINGS: Mildly motion degraded examination. BRAIN: No intraparenchymal hemorrhage, mass effect nor midline shift. The ventricles and sulci are normal for age. Patchy supratentorial white matter hypodensities within normal range for patient's age, though non-specific are most compatible with chronic small vessel ischemic disease. Old RIGHT cerebellar infarct. No acute large vascular territory infarcts. No abnormal extra-axial fluid collections. Basal cisterns are patent. VASCULAR: Mild to moderate calcific atherosclerosis of the carotid siphons. SKULL: No skull fracture. Severe temporomandibular osteoarthrosis. No significant scalp soft tissue swelling. SINUSES/ORBITS: Small LEFT sphenoid sinus air-fluid level. RIGHT jugular bulb dehiscence. The included ocular globes and orbital contents are non-suspicious. Status post bilateral ocular lens implants. OTHER: None. IMPRESSION: 1. No acute intracranial process on this motion degraded examination. 2. Stable examination including old RIGHT cerebellar infarct and mild to moderate chronic small vessel  ischemic disease. Electronically Signed   By: Elon Alas M.D.   On: 10/25/2017 23:20   Dg Chest Portable 1 View  Result Date: 10/25/2017 CLINICAL DATA:  Altered mental status. EXAM: PORTABLE CHEST 1 VIEW COMPARISON:  07/29/2017 FINDINGS: The cardiomediastinal silhouette is unchanged. Aortic eft cirrhosis is noted. The lungs remain hypoinflated with increased patchy opacity in the left lung base. No sizable pleural effusion or pneumothorax is identified. There is an old fracture of the proximal left humerus. Moderate S-shaped thoracolumbar scoliosis is noted. IMPRESSION: Low lung volumes with increasing left basilar opacity which may reflect atelectasis, pneumonia, or aspiration. Electronically Signed   By: Logan Bores M.D.   On: 10/25/2017 12:18    Results/Tests Pending at Time of Discharge:   Discharge Medications:  Allergies as of 10/31/2017      Reactions   Amoxicillin-pot Clavulanate Hives, Diarrhea   Has patient had a PCN reaction causing immediate rash, facial/tongue/throat swelling, SOB or lightheadedness with hypotension: yes Has patient had a PCN reaction causing severe rash involving mucus membranes or skin necrosis: yes Has patient had a PCN reaction that required hospitalization : unknown Has patient had a PCN reaction occurring within the last 10 years: yes If all of  the above answers are "NO", then may proceed with Cephalosporin use.   Lisinopril-hydrochlorothiazide Other (See Comments)   Tired- no energy to do anything..   Sulfonamide Derivatives Hives      Medication List    STOP taking these medications   ALPRAZolam 1 MG tablet Commonly known as:  XANAX   fentaNYL 25 MCG/HR patch Commonly known as:  DURAGESIC - dosed mcg/hr   furosemide 20 MG tablet Commonly known as:  LASIX   oxycodone 30 MG immediate release tablet Commonly known as:  ROXICODONE   traZODone 50 MG tablet Commonly known as:  DESYREL     TAKE these medications   aspirin EC 81 MG  tablet Take 1 tablet (81 mg total) by mouth 2 (two) times daily. What changed:  when to take this   folic acid 1 MG tablet Commonly known as:  FOLVITE Take 1 tablet (1 mg total) by mouth daily.   gabapentin 100 MG capsule Commonly known as:  NEURONTIN Take 2 capsules (200 mg total) by mouth 3 (three) times daily.   thiamine 100 MG tablet Take 1 tablet (100 mg total) by mouth daily.   traMADol 50 MG tablet Commonly known as:  ULTRAM Take 1 tablet (50 mg total) by mouth at bedtime.       Discharge Instructions: Please refer to Patient Instructions section of EMR for full details.  Patient was counseled important signs and symptoms that should prompt return to medical care, changes in medications, dietary instructions, activity restrictions, and follow up appointments.   Follow-Up Appointments:   Sherene Sires, DO 11/01/2017, 6:40 PM PGY-1, [place]

## 2017-10-26 NOTE — Progress Notes (Signed)
Family Medicine Teaching Service Daily Progress Note Intern Pager: [phone]  Patient name: Angelica Bell Medical record number: [id number] Date of birth: [birth year] Age: 83 y.o. Gender: female  Primary Care Provider: Heywood Bene, PA-C Consultants:  Code Status: full  Pt Overview and Major Events to Date:  Angelica Bell is a 83 y.o. female presenting with altered mental status . PMH is significant for hypertension, remote history of PE not on anticoagulation, anxiety, depression, osteoporosis with right femoral neck fracture, peripheral neuropathy, osteoarthritis & chronic pain.   Assessment and Plan: Angelica Bell is a 83 y.o. female presenting with altered mental status . PMH is significant for hypertension, remote history of PE not on anticoagulation, anxiety, depression, osteoporosis with right femoral neck fracture, peripheral neuropathy, osteoarthritis & chronic pain. Possible xanax OD per story by son.  Altered mental status: Resolving partially.  Altered mental status likely due to overdose with benzo and opiates based on history and UDS. CBG within normal limits. No focal neuro findings to suspect CVA .  She has no clonus. Patellar reflexes 1+ bilaterally. Biceps reflexes 2+ bilaterally.  Serotonin syndrome considered but unlikely. Doubt infectious etiology without fever or leukocytosis. However, CXR with some opacities over her left lower lobe concerning for pneumonia. Discussed patient with poison control. Recommended monitoring for seizure, CK and supportive management. Economic, salicylate, Tylenol level, CK, lactic acid and carboxyhemoglobin already within normal limits. CT head neg for acute process -Admit to stepdown. Attending Dr. Ardelia Mems -Obtain EEG (pending) -Hold sedating medications -Seizure precautions -CIWA protocol -Cardiac monitoring 24 hours -Cycle troponin 2. Negative in ED -Oxygen as needed -Diet regular now as alert and passing nursing swallow  test -social work consulted to verify guardianship as there may be a capacity question after acute delirium ruled out -will try and contact pcp to establish baseline on monday  ? Pneumonia: CXR with some opacities over her left lower lobe concerning for pneumonia. She has no respiratory symptoms. Satting okay on room air -We will start Levaquin. (10/27< )No QTc prolongation  Hypertension: Currently normotensive -We'll continue monitoring  Remote history of PE in 1967. Not on anticoagulation. PE well's score 1.5 (prior history of PE). DVT unlikely based on neutrophils total lymphocyte ratio and platelet to lymphocyte ratio  Anxiety/depression: Since she is on Lexapro, Trazodone and Xanax at home. Likely with the next overdose now -Hold home medications -CIWA protocol  Osteoarthritis/chronic pain: Since she is on oxycodone and fentanyl patch at home -Hold all medications pending improvement in mental status -IV fentanyl 25 g q3h when necessary  Osteoporosis with history of femoral neck fracture:  -Outpatient  FEN/GI: -Nothing by mouth -IV 5D1/2NS@75   Prophylaxis: -Lovenox  Disposition: Admit to stepdown units for evaluation and management of altered mental status  Subjective:  Patient alert and able to have conversation today although very inconsistent with details that keep changing during our conversation, unsure if this is baseline for her or an acute delirium but still an improvement from admission.   She emphatically denies any SI/HI.   Denies any pain or SOB.  Says she doesn't drink much at all and denied being on anything besides oxycodone and aspirin.  Expresses widely variable story about her relationship with grandson but again me be acutely delirious.  Objective: Temp:  [97.7 F (36.5 C)-98.3 F (36.8 C)] 97.7 F (36.5 C) (10/28 0652) Pulse Rate:  [66-92] 69 (10/28 0652) Resp:  [12-23] 19 (10/28 0652) BP: (96-161)/(47-86) 114/58 (10/28 0652) SpO2:  [92  %-100 %]  100 % (10/28 0652) Weight:  [116 lb 13.5 oz (53 kg)] 116 lb 13.5 oz (53 kg) (10/28 0431) Physical Exam: GEN: Frail elderly lady lying on bed, no acute distress, opens her eyes in response to her name Head: normocephalic and atraumatic  Eyes: EOMI Nares: no rhinorrhea, congestion or erythema  Oropharynx: No dentition HEM: negative for cervical or periauricular lymphadenopathies CVS: RRR, nl s1 & s2, no murmurs, no edema RESP: no IWOB, good air movement bilaterally, CTAB anteriorly GI: BS present & normal, soft, NTND, no guarding, no rebound, no mass GU: no suprapubic tenderness MSK: no focal tenderness or notable swelling SKIN: no apparent skin lesion NEURO: AOx3 with significant memory deficits and story changing. Pupils mildly reactive bilaterally, no facial drooping. . CN grossly intact.    Laboratory:  Recent Labs Lab 10/25/17 1144 10/26/17 0219  WBC 8.8 8.6  HGB 11.0* 11.3*  HCT 35.1* 35.0*  PLT 249 247    Recent Labs Lab 10/25/17 1144 10/26/17 0219  NA 139 140  K 3.6 3.5  CL 105 105  CO2 28 24  BUN 13 8  CREATININE 0.94 0.75  CALCIUM 8.7* 9.0  PROT 5.3* 5.2*  BILITOT 0.3 0.6  ALKPHOS 62 66  ALT 12* 11*  AST 24 23  GLUCOSE 108* 83    troponins <0.03  Imaging/Diagnostic Tests: Ct Head Wo Contrast  Result Date: 10/25/2017 CLINICAL DATA:  Altered mental status.  History of hypertension. EXAM: CT HEAD WITHOUT CONTRAST TECHNIQUE: Contiguous axial images were obtained from the base of the skull through the vertex without intravenous contrast. COMPARISON:  CT HEAD July 29, 2017 FINDINGS: Mildly motion degraded examination. BRAIN: No intraparenchymal hemorrhage, mass effect nor midline shift. The ventricles and sulci are normal for age. Patchy supratentorial white matter hypodensities within normal range for patient's age, though non-specific are most compatible with chronic small vessel ischemic disease. Old RIGHT cerebellar infarct. No acute large vascular  territory infarcts. No abnormal extra-axial fluid collections. Basal cisterns are patent. VASCULAR: Mild to moderate calcific atherosclerosis of the carotid siphons. SKULL: No skull fracture. Severe temporomandibular osteoarthrosis. No significant scalp soft tissue swelling. SINUSES/ORBITS: Small LEFT sphenoid sinus air-fluid level. RIGHT jugular bulb dehiscence. The included ocular globes and orbital contents are non-suspicious. Status post bilateral ocular lens implants. OTHER: None. IMPRESSION: 1. No acute intracranial process on this motion degraded examination. 2. Stable examination including old RIGHT cerebellar infarct and mild to moderate chronic small vessel ischemic disease. Electronically Signed   By: Elon Alas M.D.   On: 10/25/2017 23:20   Dg Chest Portable 1 View  Result Date: 10/25/2017 CLINICAL DATA:  Altered mental status. EXAM: PORTABLE CHEST 1 VIEW COMPARISON:  07/29/2017 FINDINGS: The cardiomediastinal silhouette is unchanged. Aortic eft cirrhosis is noted. The lungs remain hypoinflated with increased patchy opacity in the left lung base. No sizable pleural effusion or pneumothorax is identified. There is an old fracture of the proximal left humerus. Moderate S-shaped thoracolumbar scoliosis is noted. IMPRESSION: Low lung volumes with increasing left basilar opacity which may reflect atelectasis, pneumonia, or aspiration. Electronically Signed   By: Logan Bores M.D.   On: 10/25/2017 12:18     Sherene Sires, DO 10/26/2017, 7:12 AM PGY-1, [place] Intern pager: [phone], text pages welcome

## 2017-10-26 NOTE — Clinical Social Work Note (Signed)
Clinical Social Work Assessment  Patient Details  Name: ALIECE HONOLD MRN: [id number] Date of Birth: May 17, 1932  Date of referral:  10/26/17               Reason for consult:  Guardianship Needs, Other (Comment Required) (consult to see who patients POA is)                Permission sought to share information with:  Family Supports Permission granted to share information::  Yes, Verbal Permission Granted  Name::     [name] Case [name])  Agency::     Relationship::  grandson  Sport and exercise psychologist Information:  [phone]  Housing/Transportation Living arrangements for the past 2 months:  Single Family Home Source of Information:  Power of Oneta Rack (grandson stated he is the [employer]) Patient Interpreter Needed:  None Criminal Activity/Legal Involvement Pertinent to Current Situation/Hospitalization:  No - Comment as needed Significant Relationships:  Adult Children, Other Family Members Lives with:  Self Do you feel safe going back to the place where you live?  No Need for family participation in patient care:  Yes (Comment)  Care giving concerns:  No family at bedside   Social Worker assessment / plan:  CSW received consult for patient in regards to verify who her HPOA would be. CSW contacted patients emergency contact [place]. [name] is the patients grandson and per [name] and patient [name] is the POA. CSW requested that [facility] please bring a copy of the paperwork so the hospital staff are able to verify statement. [name] stated he lives a mile away from patient and does assisst her with a lot of her needs. [name] stated he checks on patient everyday. [name] stated he does the grocery shopping and pays the bills for the patient. [name] stated patients adult son is able to give patient support but does not always do so. Employment status:  Retired Forensic scientist:  Other (Comment Required) PT Recommendations:  Not assessed at this time Information / Referral to community resources:  [place]  Patient/Family's Response to care:  Patients grandson is very supportive of patients needs   Patient/Family's Understanding of and Emotional Response to Diagnosis, Current Treatment, and Prognosis:  [name] is coming to hospital to visit patient and speak to the doctor about patients care  Emotional Assessment Appearance:  Appears stated age Attitude/Demeanor/Rapport:  Unable to Assess Affect (typically observed):  Unable to Assess Orientation:  Oriented to Self, Oriented to Situation, Oriented to Place, Oriented to  Time Alcohol / Substance use:  Not Applicable Psych involvement (Current and /or in the community):  No (Comment)  Discharge Needs  Concerns to be addressed:  No discharge needs identified Readmission within the last 30 days:  No Current discharge risk:  None Barriers to Discharge:  No Barriers Identified   Wende Neighbors, LCSW 10/26/2017, 3:47 PM

## 2017-10-26 NOTE — Progress Notes (Signed)
Pt pulled and removed left hand PIV. L AC PIV also malpositioned. Infiltration surrounding elbow. PIV removed. Elevated extremity. Pharmacy stated no tx for Levaquin infiltration. Other IV access established.

## 2017-10-27 ENCOUNTER — Inpatient Hospital Stay (HOSPITAL_COMMUNITY): Payer: Medicare Other

## 2017-10-27 ENCOUNTER — Encounter (HOSPITAL_COMMUNITY): Payer: Self-pay

## 2017-10-27 DIAGNOSIS — T1491XA Suicide attempt, initial encounter: Secondary | ICD-10-CM

## 2017-10-27 DIAGNOSIS — T50902A Poisoning by unspecified drugs, medicaments and biological substances, intentional self-harm, initial encounter: Secondary | ICD-10-CM

## 2017-10-27 DIAGNOSIS — R400 Somnolence: Secondary | ICD-10-CM

## 2017-10-27 DIAGNOSIS — F332 Major depressive disorder, recurrent severe without psychotic features: Secondary | ICD-10-CM

## 2017-10-27 DIAGNOSIS — R4182 Altered mental status, unspecified: Secondary | ICD-10-CM

## 2017-10-27 DIAGNOSIS — Z79899 Other long term (current) drug therapy: Secondary | ICD-10-CM

## 2017-10-27 DIAGNOSIS — Z87891 Personal history of nicotine dependence: Secondary | ICD-10-CM

## 2017-10-27 DIAGNOSIS — T50904A Poisoning by unspecified drugs, medicaments and biological substances, undetermined, initial encounter: Secondary | ICD-10-CM

## 2017-10-27 LAB — CBC
HCT: 33.9 % — ABNORMAL LOW (ref 36.0–46.0)
HEMOGLOBIN: 10.9 g/dL — AB (ref 12.0–15.0)
MCH: 26.9 pg (ref 26.0–34.0)
MCHC: 32.2 g/dL (ref 30.0–36.0)
MCV: 83.7 fL (ref 78.0–100.0)
Platelets: 269 10*3/uL (ref 150–400)
RBC: 4.05 MIL/uL (ref 3.87–5.11)
RDW: 13.8 % (ref 11.5–15.5)
WBC: 6.1 10*3/uL (ref 4.0–10.5)

## 2017-10-27 LAB — BASIC METABOLIC PANEL
ANION GAP: 6 (ref 5–15)
BUN: 7 mg/dL (ref 6–20)
CHLORIDE: 107 mmol/L (ref 101–111)
CO2: 28 mmol/L (ref 22–32)
Calcium: 9 mg/dL (ref 8.9–10.3)
Creatinine, Ser: 0.75 mg/dL (ref 0.44–1.00)
GFR calc non Af Amer: >60 mL/min (ref >60)
Glucose, Bld: 98 mg/dL (ref 65–99)
POTASSIUM: 3.4 mmol/L — AB (ref 3.5–5.1)
Sodium: 141 mmol/L (ref 135–145)

## 2017-10-27 MED ORDER — INFLUENZA VAC SPLIT HIGH-DOSE 0.5 ML IM SUSY
0.5000 mL | PREFILLED_SYRINGE | INTRAMUSCULAR | Status: DC
  Filled 2017-10-27: qty 0.5

## 2017-10-27 MED ORDER — POTASSIUM CHLORIDE CRYS ER 20 MEQ PO TBCR
40.0000 meq | EXTENDED_RELEASE_TABLET | Freq: Two times a day (BID) | ORAL | Status: AC
  Administered 2017-10-27 (×2): 40 meq via ORAL
  Filled 2017-10-27 (×2): qty 2

## 2017-10-27 MED ORDER — LORAZEPAM 2 MG/ML IJ SOLN: 1.0000 mg | Freq: Once | INTRAMUSCULAR | Status: DC

## 2017-10-27 MED ORDER — LORAZEPAM 1 MG PO TABS: 1.0000 mg | ORAL_TABLET | Freq: Once | ORAL | Status: DC

## 2017-10-27 NOTE — Progress Notes (Signed)
  OT Cancellation Note  Patient Details Name: KAILEENA OBI MRN: [id number] DOB: [birth year]   Cancelled Treatment:    Reason Eval/Treat Not Completed: Patient at procedure or test/ unavailable. Pt off unit for EEG at this time. Will check back as able to initiate evaluation.   Norman Herrlich, MS OTR/L  Pager: [place] A [place] 10/27/2017, 9:02 AM

## 2017-10-27 NOTE — Progress Notes (Signed)
Family Medicine Teaching Service Daily Progress Note Intern Pager: [phone]  Patient name: Angelica Bell Medical record number: [id number] Date of birth: 23-Dec-1932 Age: 83 y.o. Gender: female  Primary Care Provider: Heywood Bene, PA-C Consultants:  Code Status: full  Pt Overview and Major Events to Date:  Angelica Bell is a 83 y.o. female presenting with altered mental status . PMH is significant for hypertension, remote history of PE not on anticoagulation, anxiety, depression, osteoporosis with right femoral neck fracture, peripheral neuropathy, osteoarthritis & chronic pain.   Assessment and Plan: Angelica Bell is a 83 y.o. female presenting with altered mental status . PMH is significant for hypertension, remote history of PE not on anticoagulation, anxiety, depression, osteoporosis with right femoral neck fracture, peripheral neuropathy, osteoarthritis & chronic pain. Possible xanax OD per story by son.  Altered mental status: Resolved to baseline per grandson.  Altered mental status likely due to overdose with benzo and opiates based on history and UDS. No focal neuro findings to suspect CVA .   Doubt infectious etiology without fever or leukocytosis. However, CXR with some opacities over her left lower lobe concerning for pneumonia. Poison control recommended monitoring for seizure, CK and supportive management. Salicylate, Tylenol level, CK, lactic acid and carboxyhemoglobin already within normal limits. CT head neg for acute process.  Neg troponin.  EEG neg. -Admit to stepdown. Attending Dr. Ardelia Mems -Hold sedating medications -Seizure precautions -CIWA/COWS protocol -Cardiac monitoring 24 hours -Oxygen as needed -Diet regular now as alert and passing nursing swallow test -social work consulted to verify guardianship as there may be a capacity question after acute delirium ruled out -will try and contact pcp to establish baseline on monday  ? Pneumonia: CXR with  some opacities over her left lower lobe concerning for pneumonia. She has no respiratory symptoms. Satting okay on room air -We will start Levaquin. (10/27< )No QTc prolongation  Hypertension: Currently normotensive -We'll continue monitoring  Remote history of PE in 1967. Not on anticoagulation. PE well's score 1.5 (prior history of PE). DVT unlikely based on neutrophils total lymphocyte ratio and platelet to lymphocyte ratio  Anxiety/depression: Since she is on Lexapro, Trazodone and Xanax at home. Story from family includes near empty bottle of xanex that had just been refilled. -Hold home medications -CIWA protocol (0,2,3)  Osteoarthritis/chronic pain: Since she is on oxycodone and fentanyl patch at home -Hold oxy -IV fentanyl 25 g q3prn when necessary  Osteoporosis with history of femoral neck fracture:  -Outpatient  FEN/GI: -regular diet -DC fluids as patient is eating regular diet  Prophylaxis: -Lovenox  Disposition: Admit to stepdown units for evaluation and management of altered mental status  Subjective:  Patient was feeling well and was agreeable to [facility] testing (14).   Would like to go home and not go to any facility.  Objective: Temp:  [97.7 F (36.5 C)-98.7 F (37.1 C)] 98.1 F (36.7 C) (10/29 0500) Pulse Rate:  [66-86] 75 (10/29 0500) Resp:  [14-26] 14 (10/29 0500) BP: (111-159)/(58-107) 153/78 (10/29 0500) SpO2:  [95 %-100 %] 96 % (10/29 0500) Physical Exam: GEN: Frail elderly lady lying on bed, no acute distress, opens her eyes in response to her name Head: normocephalic and atraumatic  Eyes: EOMI Nares: no rhinorrhea, congestion or erythema  Oropharynx: No dentition HEM: negative for cervical or periauricular lymphadenopathies CVS: RRR, nl s1 & s2, no murmurs, no edema RESP: no IWOB, good air movement bilaterally, CTAB anteriorly GI: BS present & normal, soft, NTND, no guarding, no  rebound, no mass GU: no suprapubic tenderness MSK: no  focal tenderness or notable swelling SKIN: no apparent skin lesion NEURO: AOx3 with significant memory deficits and story changing.  CN grossly intact.    Laboratory:  Recent Labs Lab 10/25/17 1144 10/26/17 0219 10/27/17 0338  WBC 8.8 8.6 6.1  HGB 11.0* 11.3* 10.9*  HCT 35.1* 35.0* 33.9*  PLT 249 247 269    Recent Labs Lab 10/25/17 1144 10/26/17 0219 10/27/17 0338  NA 139 140 141  K 3.6 3.5 3.4*  CL 105 105 107  CO2 28 24 28   BUN 13 8 7   CREATININE 0.94 0.75 0.75  CALCIUM 8.7* 9.0 9.0  PROT 5.3* 5.2*  --   BILITOT 0.3 0.6  --   ALKPHOS 62 66  --   ALT 12* 11*  --   AST 24 23  --   GLUCOSE 108* 83 98     Imaging/Diagnostic Tests: Ct Head Wo Contrast  Result Date: 10/25/2017 CLINICAL DATA:  Altered mental status.  History of hypertension. EXAM: CT HEAD WITHOUT CONTRAST TECHNIQUE: Contiguous axial images were obtained from the base of the skull through the vertex without intravenous contrast. COMPARISON:  CT HEAD July 29, 2017 FINDINGS: Mildly motion degraded examination. BRAIN: No intraparenchymal hemorrhage, mass effect nor midline shift. The ventricles and sulci are normal for age. Patchy supratentorial white matter hypodensities within normal range for patient's age, though non-specific are most compatible with chronic small vessel ischemic disease. Old RIGHT cerebellar infarct. No acute large vascular territory infarcts. No abnormal extra-axial fluid collections. Basal cisterns are patent. VASCULAR: Mild to moderate calcific atherosclerosis of the carotid siphons. SKULL: No skull fracture. Severe temporomandibular osteoarthrosis. No significant scalp soft tissue swelling. SINUSES/ORBITS: Small LEFT sphenoid sinus air-fluid level. RIGHT jugular bulb dehiscence. The included ocular globes and orbital contents are non-suspicious. Status post bilateral ocular lens implants. OTHER: None. IMPRESSION: 1. No acute intracranial process on this motion degraded examination. 2. Stable  examination including old RIGHT cerebellar infarct and mild to moderate chronic small vessel ischemic disease. Electronically Signed   By: Elon Alas M.D.   On: 10/25/2017 23:20   Dg Chest Portable 1 View  Result Date: 10/25/2017 CLINICAL DATA:  Altered mental status. EXAM: PORTABLE CHEST 1 VIEW COMPARISON:  07/29/2017 FINDINGS: The cardiomediastinal silhouette is unchanged. Aortic eft cirrhosis is noted. The lungs remain hypoinflated with increased patchy opacity in the left lung base. No sizable pleural effusion or pneumothorax is identified. There is an old fracture of the proximal left humerus. Moderate S-shaped thoracolumbar scoliosis is noted. IMPRESSION: Low lung volumes with increasing left basilar opacity which may reflect atelectasis, pneumonia, or aspiration. Electronically Signed   By: Logan Bores M.D.   On: 10/25/2017 12:18     Sherene Sires, DO 10/27/2017, 6:04 AM PGY-1, [place] Intern pager: [phone], text pages welcome

## 2017-10-27 NOTE — Consult Note (Signed)
[facility] Face-to-Face Psychiatry Consult   Reason for Consult:  AMS and capacity evaluation Referring Physician:  Dr. Pollie Meyer Patient Identification: Angelica Bell MRN:  [id number] Principal Diagnosis: Altered mental status Diagnosis:   Patient Active Problem List   Diagnosis Date Noted  . Drug overdose [T50.901A]   . Polypharmacy [Z79.899]   . Altered mental status [R41.82] 10/25/2017  . Closed displaced intertrochanteric fracture of left femur (HCC) [S72.142A] 02/16/2017  . Hip fracture, right (HCC) [S72.001A] 12/25/2015  . Chronic pain [G89.29] 12/24/2015  . Fracture of femoral neck, right, closed (HCC) [S72.001A] 12/24/2015  . Essential hypertension [I10] 12/24/2015  . Community acquired pneumonia [J18.9] 04/15/2013  . Hypokalemia [E87.6] 04/15/2013  . Labial cyst [N90.7] 02/08/2012  . POSTMENOPAUSAL SYNDROME [N95.9] 11/28/2009  . PERIPHERAL NEUROPATHY, LOWER EXTREMITY, RIGHT [G57.90] 02/22/2009  . INSOMNIA-SLEEP DISORDER-UNSPEC [G47.00] 10/12/2008  . Lumbago [M54.5] 08/02/2008  . 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 ANXIETY DEPRESSION [F34.1] 12/16/2007  . HYPERLIPIDEMIA [E78.5] 07/08/2007  . Osteoarthritis [M19.90] 07/08/2007  . Osteoporosis [M81.0] 07/08/2007  . SCOLIOSIS NEC [M41.80] 07/08/2007    Total Time spent with patient: 1 hour  Subjective:   Angelica Bell is a 83 y.o. female patient admitted with AMS.  HPI:  Angelica Bell is a 83 y.o. female, sitting, chart reviewed for this face-to-face psychiatric consultation and evaluation of altered mental status and possible capacity evaluation. Patient endorses feeling depression anxiety and worries about her brother who was suffered stroke and has been placed in a nursing home. Patient feels embarrassment when talked about possible intentional overdose and stated everybody knows in the hospital where she is here and she does not want to talk about it. Patient reported she has been living by  herself and she has children living in [place] and [place]. She has a and the daughter was living there for a very and does not have much communication. Patient reportedly receiving her outpatient medication management from [place]. Patient denies current suicidal ideation, homicidal ideation and has no evidence of psychosis. Patient denies a history of acute psychiatric hospitalization or substance abuse. Patient meet criteria for inpatient psychiatric hospitalization as she has been depressed and trying to end her life by intentional overdose. Patient urine drug screen is positive for opiates and benzodiazepines. Patient has no current symptoms of opioid and benzodiazepine withdrawals except she has diarrhea which she has to go to the bathroom more frequently.  Medical history: Patient was not awake enough to provide history. History based on collateral information from grandson, Onalee Hua Case. Per Mr. Case, patient was in her usual state of health when he saw her last night about 7:15 PM. At that time, she ate her dinner and had no acute concerns or symptoms. When he came back to her house this morning to eat breakfast with her, he found her altered and laying in bed surrounded by her medication bottles. He states that there were only 4 pills of Xanax left in her bottle. He says she filled xanax about 4 days ago. He also noted a glass full of wine on the table. He states they are quite few pills of trazodone and oxycodone bottles but doesn't remember the exact number. He says he is not at her house right now to look into her bottles.  Off note, Mr. Case claims to be her HCPOA. However, he doesn't have a paper yet. As far as he knows, patient is full code although she was DNR at one point in  her previous admission  Past Psychiatric History: Depression and anxiety.  Risk to Self: Is patient at risk for suicide?: No Risk to Others:   Prior Inpatient Therapy:    Prior Outpatient Therapy:    Past Medical History:  Past Medical History:  Diagnosis Date  . Chronic back pain    with degenerative joint disease  . Chronic pain disorder    with narcotic management  . Dry eyes   . Hypertension   . Osteoporosis   . PE (pulmonary embolism)    After hysterectomy in 1967  . Pneumonia     Past Surgical History:  Procedure Laterality Date  . ABDOMINAL HYSTERECTOMY    . APPENDECTOMY    . BREAST ENHANCEMENT SURGERY    . FEMUR IM NAIL Left 02/17/2017   Procedure: INTRAMEDULLARY (IM) NAIL FEMORAL;  Surgeon: Yolonda Kida, MD;  Location: [facility] OR;  Service: Orthopedics;  Laterality: Left;  . HIP ARTHROPLASTY Right 12/24/2015   Procedure: ARTHROPLASTY BIPOLAR HIP (HEMIARTHROPLASTY);  Surgeon: Beverely Low, MD;  Location: [facility] ORS;  Service: Orthopedics;  Laterality: Right;  . TONSILLECTOMY     Family History:  Family History  Problem Relation Age of Onset  . Heart Problems Mother    Family Psychiatric  History: Unknown Social History:  History  Alcohol Use No     History  Drug Use No    Social History   Social History  . Marital status: Widowed    Spouse name: N/A  . Number of children: 3  . Years of education: HS   Occupational History  . Retired Retired   Social History Main Topics  . Smoking status: Former Smoker    Years: 13.00    Quit date: 04/15/1976  . Smokeless tobacco: Never Used  . Alcohol use No  . Drug use: No  . Sexual activity: No   Other Topics Concern  . None   Social History Narrative   Patient lives at home with her grandson.   Caffeine Use: none   Additional Social History:    Allergies:   Allergies  Allergen Reactions  . Amoxicillin-Pot Clavulanate Hives and Diarrhea    Has patient had a PCN reaction causing immediate rash, facial/tongue/throat swelling, SOB or lightheadedness with hypotension: yes Has patient had a PCN reaction causing severe rash involving mucus membranes or skin necrosis:  yes Has patient had a PCN reaction that required hospitalization : unknown Has patient had a PCN reaction occurring within the last 10 years: yes If all of the above answers are "NO", then may proceed with Cephalosporin use.   Marland Kitchen Lisinopril-Hydrochlorothiazide Other (See Comments)    Tired- no energy to do anything..  . Sulfonamide Derivatives Hives    Labs:  Results for orders placed or performed during the hospital encounter of 10/25/17 (from the past 48 hour(s))  CBC with Differential     Status: Abnormal   Collection Time: 10/25/17 11:44 AM  Result Value Ref Range   WBC 8.8 4.0 - 10.5 K/uL   RBC 4.02 3.87 - 5.11 MIL/uL   Hemoglobin 11.0 (L) 12.0 - 15.0 g/dL   HCT 40.0 (L) 86.7 - 61.9 %   MCV 87.3 78.0 - 100.0 fL   MCH 27.4 26.0 - 34.0 pg   MCHC 31.3 30.0 - 36.0 g/dL   RDW 50.9 32.6 - 71.2 %   Platelets 249 150 - 400 K/uL   Neutrophils Relative % 62 %   Neutro Abs 5.4 1.7 - 7.7 K/uL   Lymphocytes  Relative 23 %   Lymphs Abs 2.0 0.7 - 4.0 K/uL   Monocytes Relative 10 %   Monocytes Absolute 0.9 0.1 - 1.0 K/uL   Eosinophils Relative 5 %   Eosinophils Absolute 0.4 0.0 - 0.7 K/uL   Basophils Relative 0 %   Basophils Absolute 0.0 0.0 - 0.1 K/uL  Comprehensive metabolic panel     Status: Abnormal   Collection Time: 10/25/17 11:44 AM  Result Value Ref Range   Sodium 139 135 - 145 mmol/L   Potassium 3.6 3.5 - 5.1 mmol/L   Chloride 105 101 - 111 mmol/L   CO2 28 22 - 32 mmol/L   Glucose, Bld 108 (H) 65 - 99 mg/dL   BUN 13 6 - 20 mg/dL   Creatinine, Ser 0.94 0.44 - 1.00 mg/dL   Calcium 8.7 (L) 8.9 - 10.3 mg/dL   Total Protein 5.3 (L) 6.5 - 8.1 g/dL   Albumin 3.2 (L) 3.5 - 5.0 g/dL   AST 24 15 - 41 U/L   ALT 12 (L) 14 - 54 U/L   Alkaline Phosphatase 62 38 - 126 U/L   Total Bilirubin 0.3 0.3 - 1.2 mg/dL   GFR calc non Af Amer 55 (L) >60 mL/min   GFR calc Af Amer >60 >60 mL/min    Comment: (NOTE) The eGFR has been calculated using the CKD EPI equation. This calculation has  not been validated in all clinical situations. eGFR's persistently <60 mL/min signify possible Chronic Kidney Disease.    Anion gap 6 5 - 15  Acetaminophen level     Status: Abnormal   Collection Time: 10/25/17 11:44 AM  Result Value Ref Range   Acetaminophen (Tylenol), Serum <10 (L) 10 - 30 ug/mL    Comment:        THERAPEUTIC CONCENTRATIONS VARY SIGNIFICANTLY. A RANGE OF 10-30 ug/mL MAY BE AN EFFECTIVE CONCENTRATION FOR MANY PATIENTS. HOWEVER, SOME ARE BEST TREATED AT CONCENTRATIONS OUTSIDE THIS RANGE. ACETAMINOPHEN CONCENTRATIONS >150 ug/mL AT 4 HOURS AFTER INGESTION AND >50 ug/mL AT 12 HOURS AFTER INGESTION ARE OFTEN ASSOCIATED WITH TOXIC REACTIONS.   Salicylate level     Status: None   Collection Time: 10/25/17 11:44 AM  Result Value Ref Range   Salicylate Lvl <9.6 2.8 - 30.0 mg/dL  Ethanol     Status: None   Collection Time: 10/25/17 11:44 AM  Result Value Ref Range   Alcohol, Ethyl (B) <10 <10 mg/dL    Comment:        LOWEST DETECTABLE LIMIT FOR SERUM ALCOHOL IS 10 mg/dL FOR MEDICAL PURPOSES ONLY   I-stat troponin, ED     Status: None   Collection Time: 10/25/17 11:56 AM  Result Value Ref Range   Troponin i, poc 0.00 0.00 - 0.08 ng/mL   Comment 3            Comment: Due to the release kinetics of cTnI, a negative result within the first hours of the onset of symptoms does not rule out myocardial infarction with certainty. If myocardial infarction is still suspected, repeat the test at appropriate intervals.   Rapid urine drug screen (hospital performed)     Status: Abnormal   Collection Time: 10/25/17 12:09 PM  Result Value Ref Range   Opiates POSITIVE (A) NONE DETECTED   Cocaine NONE DETECTED NONE DETECTED   Benzodiazepines POSITIVE (A) NONE DETECTED   Amphetamines NONE DETECTED NONE DETECTED   Tetrahydrocannabinol NONE DETECTED NONE DETECTED   Barbiturates NONE DETECTED NONE DETECTED  Comment:        DRUG SCREEN FOR MEDICAL PURPOSES ONLY.  IF  CONFIRMATION IS NEEDED FOR ANY PURPOSE, NOTIFY LAB WITHIN 5 DAYS.        LOWEST DETECTABLE LIMITS FOR URINE DRUG SCREEN Drug Class       Cutoff (ng/mL) Amphetamine      1000 Barbiturate      200 Benzodiazepine   751 Tricyclics       025 Opiates          300 Cocaine          300 THC              50   Urinalysis, Routine w reflex microscopic     Status: None   Collection Time: 10/25/17 12:09 PM  Result Value Ref Range   Color, Urine YELLOW YELLOW   APPearance CLEAR CLEAR   Specific Gravity, Urine 1.014 1.005 - 1.030   pH 5.0 5.0 - 8.0   Glucose, UA NEGATIVE NEGATIVE mg/dL   Hgb urine dipstick NEGATIVE NEGATIVE   Bilirubin Urine NEGATIVE NEGATIVE   Ketones, ur NEGATIVE NEGATIVE mg/dL   Protein, ur NEGATIVE NEGATIVE mg/dL   Nitrite NEGATIVE NEGATIVE   Leukocytes, UA NEGATIVE NEGATIVE  CBG monitoring, ED     Status: None   Collection Time: 10/25/17 12:17 PM  Result Value Ref Range   Glucose-Capillary 89 65 - 99 mg/dL  Lactic acid, plasma     Status: None   Collection Time: 10/25/17  2:32 PM  Result Value Ref Range   Lactic Acid, Venous 1.4 0.5 - 1.9 mmol/L  Carboxyhemoglobin (single result)     Status: None   Collection Time: 10/25/17  2:48 PM  Result Value Ref Range   Carboxyhemoglobin 0.6 0.5 - 1.5 %  CK     Status: None   Collection Time: 10/25/17  3:59 PM  Result Value Ref Range   Total CK 80 38 - 234 U/L  Protime-INR     Status: Abnormal   Collection Time: 10/25/17  3:59 PM  Result Value Ref Range   Prothrombin Time 15.8 (H) 11.4 - 15.2 seconds   INR 1.27   I-Stat venous blood gas, ED     Status: Abnormal   Collection Time: 10/25/17  7:05 PM  Result Value Ref Range   pH, Ven 7.326 7.250 - 7.430   pCO2, Ven 55.9 44.0 - 60.0 mmHg   pO2, Ven 25.0 (LL) 32.0 - 45.0 mmHg   Bicarbonate 29.2 (H) 20.0 - 28.0 mmol/L   TCO2 31 22 - 32 mmol/L   O2 Saturation 38.0 %   Acid-Base Excess 2.0 0.0 - 2.0 mmol/L   Patient temperature HIDE    Sample type VENOUS    Comment  NOTIFIED PHYSICIAN   Lactic acid, plasma     Status: None   Collection Time: 10/25/17  8:48 PM  Result Value Ref Range   Lactic Acid, Venous 1.2 0.5 - 1.9 mmol/L  Troponin I     Status: None   Collection Time: 10/25/17  8:48 PM  Result Value Ref Range   Troponin I <0.03 <0.03 ng/mL  Troponin I     Status: None   Collection Time: 10/26/17  2:19 AM  Result Value Ref Range   Troponin I <0.03 <0.03 ng/mL  Comprehensive metabolic panel     Status: Abnormal   Collection Time: 10/26/17  2:19 AM  Result Value Ref Range   Sodium 140 135 - 145 mmol/L   Potassium  3.5 3.5 - 5.1 mmol/L   Chloride 105 101 - 111 mmol/L   CO2 24 22 - 32 mmol/L   Glucose, Bld 83 65 - 99 mg/dL   BUN 8 6 - 20 mg/dL   Creatinine, Ser 0.75 0.44 - 1.00 mg/dL   Calcium 9.0 8.9 - 10.3 mg/dL   Total Protein 5.2 (L) 6.5 - 8.1 g/dL   Albumin 3.1 (L) 3.5 - 5.0 g/dL   AST 23 15 - 41 U/L   ALT 11 (L) 14 - 54 U/L   Alkaline Phosphatase 66 38 - 126 U/L   Total Bilirubin 0.6 0.3 - 1.2 mg/dL   GFR calc non Af Amer >60 >60 mL/min   GFR calc Af Amer >60 >60 mL/min    Comment: (NOTE) The eGFR has been calculated using the CKD EPI equation. This calculation has not been validated in all clinical situations. eGFR's persistently <60 mL/min signify possible Chronic Kidney Disease.    Anion gap 11 5 - 15  CBC     Status: Abnormal   Collection Time: 10/26/17  2:19 AM  Result Value Ref Range   WBC 8.6 4.0 - 10.5 K/uL   RBC 4.08 3.87 - 5.11 MIL/uL   Hemoglobin 11.3 (L) 12.0 - 15.0 g/dL   HCT 35.0 (L) 36.0 - 46.0 %   MCV 85.8 78.0 - 100.0 fL   MCH 27.7 26.0 - 34.0 pg   MCHC 32.3 30.0 - 36.0 g/dL   RDW 14.2 11.5 - 15.5 %   Platelets 247 150 - 400 K/uL  MRSA PCR Screening     Status: None   Collection Time: 10/26/17  2:52 AM  Result Value Ref Range   MRSA by PCR NEGATIVE NEGATIVE    Comment:        The GeneXpert MRSA Assay (FDA approved for NASAL specimens only), is one component of a comprehensive MRSA  colonization surveillance program. It is not intended to diagnose MRSA infection nor to guide or monitor treatment for MRSA infections.   CBC     Status: Abnormal   Collection Time: 10/27/17  3:38 AM  Result Value Ref Range   WBC 6.1 4.0 - 10.5 K/uL   RBC 4.05 3.87 - 5.11 MIL/uL   Hemoglobin 10.9 (L) 12.0 - 15.0 g/dL   HCT 33.9 (L) 36.0 - 46.0 %   MCV 83.7 78.0 - 100.0 fL   MCH 26.9 26.0 - 34.0 pg   MCHC 32.2 30.0 - 36.0 g/dL   RDW 13.8 11.5 - 15.5 %   Platelets 269 150 - 400 K/uL  Basic metabolic panel     Status: Abnormal   Collection Time: 10/27/17  3:38 AM  Result Value Ref Range   Sodium 141 135 - 145 mmol/L   Potassium 3.4 (L) 3.5 - 5.1 mmol/L   Chloride 107 101 - 111 mmol/L   CO2 28 22 - 32 mmol/L   Glucose, Bld 98 65 - 99 mg/dL   BUN 7 6 - 20 mg/dL   Creatinine, Ser 0.75 0.44 - 1.00 mg/dL   Calcium 9.0 8.9 - 10.3 mg/dL   GFR calc non Af Amer >60 >60 mL/min   GFR calc Af Amer >60 >60 mL/min    Comment: (NOTE) The eGFR has been calculated using the CKD EPI equation. This calculation has not been validated in all clinical situations. eGFR's persistently <60 mL/min signify possible Chronic Kidney Disease.    Anion gap 6 5 - 15    Current Facility-Administered Medications  Medication Dose Route Frequency Provider Last Rate Last Dose  . acetaminophen (TYLENOL) tablet 650 mg  650 mg Oral Q6H PRN Mercy Riding, MD       Or  . acetaminophen (TYLENOL) suppository 650 mg  650 mg Rectal Q6H PRN Gonfa, Taye T, MD      . enoxaparin (LOVENOX) injection 40 mg  40 mg Subcutaneous Q24H Wendee Beavers T, MD   40 mg at 10/26/17 2135  . fentaNYL (SUBLIMAZE) injection 25 mcg  25 mcg Intravenous Q2H PRN Sela Hilding, MD   25 mcg at 10/27/17 0210  . folic acid injection 1 mg  1 mg Intravenous Daily Wendee Beavers T, MD   1 mg at 10/27/17 1016  . [START ON 10/28/2017] Influenza vac split quadrivalent PF (FLUZONE HIGH-DOSE) injection 0.5 mL  0.5 mL Intramuscular Tomorrow-1000  Leeanne Rio, MD      . levofloxacin (LEVAQUIN) IVPB 750 mg  750 mg Intravenous Q24H Mercy Riding, MD   Stopped at 10/26/17 2322  . LORazepam (ATIVAN) injection 2-3 mg  2-3 mg Intravenous Q1H PRN Gonfa, Taye T, MD      . polyethylene glycol (MIRALAX / GLYCOLAX) packet 17 g  17 g Oral Daily PRN Gonfa, Taye T, MD      . potassium chloride SA (K-DUR,KLOR-CON) CR tablet 40 mEq  40 mEq Oral BID Bland, Scott, DO   40 mEq at 10/27/17 1016  . thiamine (B-1) injection 100 mg  100 mg Intravenous Daily Wendee Beavers T, MD   100 mg at 10/27/17 1016    Musculoskeletal: Strength & Muscle Tone: decreased Gait & Station: normal Patient leans: Front  Psychiatric Specialty Exam: Physical Exam as per history and physical   ROS patient has been depressed, anxious and status post intentional overdose and does not want to talk much about. Patient complaining about ongoing bathroom more frequently.  No Fever-chills, No Headache, No changes with Vision or hearing, reports vertigo No problems swallowing food or Liquids, No Chest pain, Cough or Shortness of Breath, No Abdominal pain, No Nausea or Vommitting, Bowel movements are regular, No Blood in stool or Urine, No dysuria, No new skin rashes or bruises, No new joints pains-aches,  No new weakness, tingling, numbness in any extremity, No recent weight gain or loss, No polyuria, polydypsia or polyphagia,  A full 10 point Review of Systems was done, except as stated above, all other Review of Systems were negative.  Blood pressure (!) 181/80, pulse (!) 103, temperature 98.1 F (36.7 C), temperature source Oral, resp. rate (!) 24, height _0  (1.6 m), weight 54.5 kg (120 lb 2.4 oz), SpO2 99 %.Body mass index is 21.28 kg/m.  General Appearance: Guarded  Eye Contact:  Good  Speech:  Clear and Coherent  Volume:  Normal  Mood:  Anxious and Depressed  Affect:  Congruent and Depressed  Thought Process:  Coherent and Goal Directed  Orientation:  Full  (Time, Place, and Person)  Thought Content:  Rumination  Suicidal Thoughts:  Yes.  with intent/plan  Homicidal Thoughts:  No  Memory:  Immediate;   Good Recent;   Fair Remote;   Fair  Judgement:  Impaired  Insight:  Fair  Psychomotor Activity:  Decreased  Concentration:  Concentration: Good and Attention Span: Good  Recall:  Good  Fund of Knowledge:  Good  Language:  Good  Akathisia:  Negative  Handed:  Right  AIMS (if indicated):     Assets:  Communication Skills Desire for Improvement Financial  Resources/Insurance Housing Leisure Time Resilience Social Support Talents/Skills Transportation  ADL's:  Intact  Cognition:  WNL  Sleep:        Treatment Plan Summary: 83 years old female with history of depression anxiety presented with altered mental status. Patient was not willing to talk about her intentional drug overdose as a suicide attempt. Patient mental status has been improved with supportive therapy and has intact cognitions.  Major depressive disorder, recurrent, severe without psychotic symptoms  Status post intentional drug overdose  Recommendation: Patient meets criteria for capacity to make her own medical decisions.  Patient meets criteria for inpatient psychiatric hospitalization when medically stable.  Referred to the unit CSW for geriatric psychiatric inpatient psychiatric hospitalization. Required involuntary commitment as patient is not willing for treatment including starting the medication management and counseling services during this visit. Daily contact with patient to assess and evaluate symptoms and progress in treatment and Medication management  Disposition: Recommend psychiatric Inpatient admission when medically cleared. Supportive therapy provided about ongoing stressors.  Ambrose Finland, MD 10/27/2017 11:23 AM

## 2017-10-27 NOTE — Evaluation (Addendum)
Physical Therapy Evaluation Patient Details Name: Angelica Bell MRN: [id number] DOB: March 16, 1932 Today's Date: 10/27/2017   History of Present Illness  83 y.o. female presenting with altered mental status . Altered mental status likely due to overdose with benzo and opiates based on history and UDS.  PMH is significant for hypertension, remote history of PE not on anticoagulation, anxiety, depression, osteoporosis with right femoral neck fracture (2016), fall with L hip fx (01/2017), peripheral neuropathy, osteoarthritis & chronic pain.   Clinical Impression  Pt admitted with above diagnosis. Pt currently with functional limitations due to the deficits listed below (see PT Problem List). On eval, pt required supervision bed mobility, min guard assist transfers, and min guard assist ambulation with RW 25 feet.  See below for further details.  Pt will benefit from skilled PT to increase their independence and safety with mobility to allow discharge to the venue listed below.  Spoke with pt regarding ST SNF but she adamantly refuses. She also reports her grandson, who is her primary assistance, is unable to stay with her to provide 24-hour assist/supervision. While she will likely refuse any recommendations for 24-hour assist, I cannot in good faith state she is safe to live at home alone. Maximizing HHPT services would be needed, if pt agreeable.      Follow Up Recommendations Supervision/Assistance - 24 hour;Home health PT (pt refusing SNF)    Equipment Recommendations  None recommended by PT    Recommendations for Other Services       Precautions / Restrictions Precautions Precautions: Fall Restrictions Weight Bearing Restrictions: No      Mobility  Bed Mobility Overal bed mobility: Needs Assistance Bed Mobility: Supine to Sit;Sit to Supine     Supine to sit: Supervision;HOB elevated Sit to supine: Supervision;HOB elevated   General bed mobility comments: +rail, supervision for  safety  Transfers Overall transfer level: Needs assistance Equipment used: Rolling walker (2 wheeled) Transfers: Sit to/from Stand Sit to Stand: Min guard         General transfer comment: verbal cues for hand placement, increased time to stabilize initial standing balance  Ambulation/Gait Ambulation/Gait assistance: Min guard Ambulation Distance (Feet): 25 Feet Assistive device: Rolling walker (2 wheeled) Gait Pattern/deviations: Step-through pattern;Trunk flexed;Decreased stride length;Antalgic Gait velocity: decreased Gait velocity interpretation: Below normal speed for age/gender General Gait Details: At baseline, pt ambulates with a flexed trunk. Presents with antalgic gait due to R hip pain. Pt declining ambulation outside of room. Pt adamant she is able to ambulate without AD. On attempt, pt required min assist and only able to take 3-4 steps before pivoting to return to EOB.   Stairs            Wheelchair Mobility    Modified Rankin (Stroke Patients Only)       Balance Overall balance assessment: Needs assistance Sitting-balance support: No upper extremity supported;Feet supported Sitting balance-Leahy Scale: Good     Standing balance support: During functional activity;No upper extremity supported Standing balance-Leahy Scale: Poor                               Pertinent Vitals/Pain Pain Assessment: 0-10 Pain Score: 9  Pain Location: R hip Pain Descriptors / Indicators: Guarding;Grimacing;Sore Pain Intervention(s): Monitored during session;Limited activity within patient's tolerance    Home Living Family/patient expects to be discharged to:: Private residence Living Arrangements: Alone Available Help at Discharge: Family;Available PRN/intermittently Type of Home: House Home Access:  Stairs to enter Entrance Stairs-Rails: None Entrance Stairs-Number of Steps: 1 Home Layout: One level Home Equipment: [place] - 2 wheels;Cane - single  point;Walker - 4 wheels      Prior Function Level of Independence: Independent with assistive device(s)         Comments: Pt lives alone. Primarily ambulates without AD. Occasional use of above noted ADs. Grandson lives close by and assists with grocery shopping and bills. He checks on her every day.     Hand Dominance        Extremity/Trunk Assessment   Upper Extremity Assessment Upper Extremity Assessment: Overall WFL for tasks assessed    Lower Extremity Assessment Lower Extremity Assessment: Generalized weakness    Cervical / Trunk Assessment Cervical / Trunk Assessment: Kyphotic  Communication   Communication: HOH  Cognition Arousal/Alertness: Awake/alert Behavior During Therapy: Anxious Overall Cognitive Status: Impaired/Different from baseline Area of Impairment: Orientation;Memory;Safety/judgement;Problem solving                 Orientation Level: Situation;Disoriented to   Memory: Decreased short-term memory   Safety/Judgement: Decreased awareness of safety   Problem Solving: Difficulty sequencing;Requires verbal cues;Requires tactile cues        General Comments      Exercises     Assessment/Plan    PT Assessment Patient needs continued PT services  PT Problem List Decreased strength;Decreased activity tolerance;Decreased balance;Decreased mobility;Pain;Decreased safety awareness;Decreased cognition       PT Treatment Interventions Gait training;Stair training;Functional mobility training;Therapeutic activities;Therapeutic exercise;Balance training;Patient/family education    PT Goals (Current goals can be found in the Care Plan section)  Acute Rehab PT Goals Patient Stated Goal: home PT Goal Formulation: With patient Time For Goal Achievement: 11/03/17 Potential to Achieve Goals: Fair    Frequency Min 3X/week   Barriers to discharge Decreased caregiver support      Co-evaluation               AM-PAC PT "6 Clicks"  Daily Activity  Outcome Measure Difficulty turning over in bed (including adjusting bedclothes, sheets and blankets)?: A Little Difficulty moving from lying on back to sitting on the side of the bed? : A Little Difficulty sitting down on and standing up from a chair with arms (e.g., wheelchair, bedside commode, etc,.)?: A Little Help needed moving to and from a bed to chair (including a wheelchair)?: A Little Help needed walking in hospital room?: A Little Help needed climbing 3-5 steps with a railing? : A Lot 6 Click Score: 17    End of Session Equipment Utilized During Treatment: Gait belt Activity Tolerance: Patient limited by pain Patient left: in bed;with call bell/phone within reach;with bed alarm set Nurse Communication: Mobility status PT Visit Diagnosis: History of falling (Z91.81);Other abnormalities of gait and mobility (R26.89)    Time: 1205-1221 PT Time Calculation (min) (ACUTE ONLY): 16 min   Charges:   PT Evaluation $PT Eval Moderate Complexity: 1 Mod     PT G Codes:        Lorrin Goodell, PT  Office # [phone] Pager [phone]   Angelica Bell 10/27/2017, 1:29 PM

## 2017-10-27 NOTE — Progress Notes (Signed)
Patient placed on suicide precautions as ordered.

## 2017-10-27 NOTE — Procedures (Signed)
ELECTROENCEPHALOGRAM REPORT  Date of Study: 10/27/2017  Patient's Name: Angelica Bell MRN: [id number] Date of Birth: 26-Jul-1932  Referring Provider: Mercy Riding, MD  Clinical History: 83 year old woman with altered mental status  Medications: acetaminophen (TYLENOL)  enoxaparin (LOVENOX)  fentaNYL (SUBLIMAZE)   folic acid  levofloxacin (LEVAQUIN)   LORazepam (ATIVAN)  polyethylene glycol (MIRALAX / GLYCOLAX)g  potassium chloride SA (K-DUR,KLOR-CON) thiamine (B-1)  Technical Summary: A multichannel digital EEG recording measured by the international 10-20 system with electrodes applied with paste and impedances below 5000 ohms performed in our laboratory with EKG monitoring in an awake and asleep patient.  Hyperventilation and photic stimulation were not performed.  The digital EEG was referentially recorded, reformatted, and digitally filtered in a variety of bipolar and referential montages for optimal display.    Description: The patient is awake and asleep during the recording.  During maximal wakefulness, there is a symmetric, medium voltage 9 Hz posterior dominant rhythm that attenuates with eye opening.  The record is symmetric.  During drowsiness and sleep, there is an increase in theta slowing of the background.  Vertex waves and symmetric sleep spindles were seen.  There were no epileptiform discharges or electrographic seizures seen.    EKG lead was unremarkable.  Impression: This awake and asleep EEG is normal.    Clinical Correlation: A normal EEG does not exclude a clinical diagnosis of epilepsy.  If further clinical questions remain, prolonged EEG may be helpful.  Clinical correlation is advised.   Metta Clines, DO

## 2017-10-27 NOTE — Evaluation (Signed)
Occupational Therapy Evaluation Patient Details Name: Angelica Bell MRN: [id number] DOB: 11-09-1932 Today's Date: 10/27/2017    History of Present Illness 83 y.o. female presenting with altered mental status . Altered mental status likely due to overdose with benzo and opiates based on history and UDS.  PMH is significant for hypertension, remote history of PE not on anticoagulation, anxiety, depression, osteoporosis with right femoral neck fracture (2016), fall with L hip fx (01/2017), peripheral neuropathy, osteoarthritis & chronic pain.    Clinical Impression   Pt reports living alone PTA and completing basic ADL and some IADL independently. She reports occasional use of RW and history of falling at home. He grandson checks in everyday and assists with grocery shopping. Pt currently presents with decreased short-term memory, poor safety awareness, decreased willingness to follow commands, decreased balance, and decreased awareness. She requires min assist for toilet transfers and min guard assist for standing grooming tasks. Pt additionally impulsive with all movement. Due to cognitive and physical functional deficits, pt is at significant risk of falling and injury at home. Discussed follow-up at SNF for continued rehabilitation and pt adamantly refuses. Recommend 24 hour assistance with maximum home health services; however, pt may not be agreeable. Will continue to follow while admitted.    Follow Up Recommendations  SNF;Home health OT;Supervision/Assistance - 24 hour    Equipment Recommendations  3 in 1 bedside commode    Recommendations for Other Services       Precautions / Restrictions Precautions Precautions: Fall Restrictions Weight Bearing Restrictions: No      Mobility Bed Mobility Overal bed mobility: Needs Assistance Bed Mobility: Supine to Sit;Sit to Supine     Supine to sit: Supervision;HOB elevated Sit to supine: Supervision;HOB elevated   General bed  mobility comments: Supervision for safety.   Transfers Overall transfer level: Needs assistance Equipment used: Rolling walker (2 wheeled) Transfers: Sit to/from Stand Sit to Stand: Min assist;Min guard         General transfer comment: Min guard from toilet; min assist from bed    Balance Overall balance assessment: Needs assistance Sitting-balance support: No upper extremity supported;Feet supported Sitting balance-Leahy Scale: Good     Standing balance support: During functional activity;No upper extremity supported Standing balance-Leahy Scale: Poor Standing balance comment: External assistance or UE support in standing.                            ADL either performed or assessed with clinical judgement   ADL Overall ADL's : Needs assistance/impaired Eating/Feeding: Set up;Sitting   Grooming: Min guard;Standing   Upper Body Bathing: Supervision/ safety;Sitting   Lower Body Bathing: Minimal assistance;Sit to/from stand   Upper Body Dressing : Supervision/safety;Sitting   Lower Body Dressing: Minimal assistance;Sit to/from stand   Toilet Transfer: Minimal assistance;Ambulation;RW Toilet Transfer Details (indicate cue type and reason): Refusing to use RW safely.  Toileting- Water quality scientist and Hygiene: Min guard;Sit to/from stand       Functional mobility during ADLs: Minimal assistance;Rolling walker General ADL Comments: Pt with minimal safety awareness impacting her ability to complete ADL tasks. Kyphotic standing at baseline per her report. When standing to walk to the bathroom, pt with increased urgency for bowel movement on standing and unsafely rushing to the bathroom requiring min assist to prevent a fall.      Vision   Vision Assessment?: No apparent visual deficits     Perception     Praxis  Pertinent Vitals/Pain Pain Assessment: Faces Faces Pain Scale: Hurts little more Pain Location: R hip Pain Descriptors / Indicators:  Guarding;Grimacing;Sore Pain Intervention(s): Monitored during session;Limited activity within patient's tolerance     Hand Dominance Right   Extremity/Trunk Assessment Upper Extremity Assessment Upper Extremity Assessment: Overall WFL for tasks assessed   Lower Extremity Assessment Lower Extremity Assessment: Generalized weakness   Cervical / Trunk Assessment Cervical / Trunk Assessment: Kyphotic   Communication Communication Communication: HOH   Cognition Arousal/Alertness: Awake/alert Behavior During Therapy: Anxious Overall Cognitive Status: Impaired/Different from baseline Area of Impairment: Orientation;Memory;Safety/judgement;Problem solving;Following commands;Awareness                 Orientation Level: Disoriented to;Situation;Time   Memory: Decreased short-term memory Following Commands: Follows one step commands inconsistently;Follows multi-step commands inconsistently (due to decreased motivation) Safety/Judgement: Decreased awareness of safety Awareness: Intellectual Problem Solving: Difficulty sequencing;Requires verbal cues;Requires tactile cues General Comments: Pt frustrated with questions being asked by staff and skeptical of intentions. Repeating questions multiple times in a short period of time. Pt with very poor safety awareness and refusing to utilize RW safely.    General Comments       Exercises     Shoulder Instructions      Home Living Family/patient expects to be discharged to:: Private residence Living Arrangements: Alone Available Help at Discharge: Family;Available PRN/intermittently Type of Home: House Home Access: Stairs to enter [employer] of Steps: 1 Entrance Stairs-Rails: None Home Layout: One level     Bathroom Shower/Tub: Occupational psychologist: Handicapped height     Home Equipment: Environmental consultant - 2 wheels;Cane - single point;Walker - 4 wheels          Prior Functioning/Environment Level of  Independence: Independent with assistive device(s)        Comments: Grandson checks in on pt daily and assists with grocery shopping and bills        OT Problem List: Decreased strength;Decreased activity tolerance;Impaired balance (sitting and/or standing);Decreased safety awareness;Decreased knowledge of use of DME or AE;Decreased knowledge of precautions;Pain      OT Treatment/Interventions: Self-care/ADL training;Therapeutic exercise;Energy conservation;DME and/or AE instruction;Therapeutic activities;Cognitive remediation/compensation;Patient/family education;Balance training    OT Goals(Current goals can be found in the care plan section) Acute Rehab OT Goals Patient Stated Goal: home OT Goal Formulation: With patient Time For Goal Achievement: 11/10/17 Potential to Achieve Goals: Good ADL Goals Pt Will Perform Grooming: with modified independence;standing Pt Will Perform Upper Body Dressing: with modified independence;sitting Pt Will Perform Lower Body Dressing: with modified independence;sit to/from stand Pt Will Transfer to Toilet: with modified independence;ambulating;regular height toilet Pt Will Perform Toileting - Clothing Manipulation and hygiene: with modified independence;sit to/from stand Additional ADL Goal #1: Pt will demonstrate emergent awareness during morning ADL routine.  Additional ADL Goal #2: Pt will demonstrate improved problem solving skills to complete simple medication management task with no more than 1 VC.   OT Frequency: Min 2X/week   Barriers to D/C:            Co-evaluation              AM-PAC PT "6 Clicks" Daily Activity     Outcome Measure Help from another person eating meals?: None Help from another person taking care of personal grooming?: A Little Help from another person toileting, which includes using toliet, bedpan, or urinal?: A Little Help from another person bathing (including washing, rinsing, drying)?: A Little Help from  another person to put on and taking  off regular upper body clothing?: A Little Help from another person to put on and taking off regular lower body clothing?: A Little 6 Click Score: 19   End of Session Equipment Utilized During Treatment: Rolling walker;Gait belt Nurse Communication: Mobility status (pt requests immodium)  Activity Tolerance: Patient tolerated treatment well Patient left: in bed;with call bell/phone within reach;with bed alarm set  OT Visit Diagnosis: Other abnormalities of gait and mobility (R26.89);Other symptoms and signs involving cognitive function;Muscle weakness (generalized) (M62.81)                Time: 2197-5883 OT Time Calculation (min): 22 min Charges:  OT General Charges $OT Visit: 1 Visit OT Evaluation $OT Eval Moderate Complexity: 1 Mod G-Codes:     Norman Herrlich, MS OTR/L  Pager: [place] 10/27/2017, 4:59 PM

## 2017-10-27 NOTE — Progress Notes (Signed)
EEG completed; results pending.    

## 2017-10-28 LAB — CBC
HCT: 33.4 % — ABNORMAL LOW (ref 36.0–46.0)
HEMOGLOBIN: 10.9 g/dL — AB (ref 12.0–15.0)
MCH: 27.3 pg (ref 26.0–34.0)
MCHC: 32.6 g/dL (ref 30.0–36.0)
MCV: 83.5 fL (ref 78.0–100.0)
PLATELETS: 281 10*3/uL (ref 150–400)
RBC: 4 MIL/uL (ref 3.87–5.11)
RDW: 14.4 % (ref 11.5–15.5)
WBC: 7.9 10*3/uL (ref 4.0–10.5)

## 2017-10-28 LAB — BASIC METABOLIC PANEL
ANION GAP: 8 (ref 5–15)
BUN: 6 mg/dL (ref 6–20)
CHLORIDE: 109 mmol/L (ref 101–111)
CO2: 24 mmol/L (ref 22–32)
CREATININE: 0.77 mg/dL (ref 0.44–1.00)
Calcium: 9.4 mg/dL (ref 8.9–10.3)
GFR calc Af Amer: >60 mL/min (ref >60)
GFR calc non Af Amer: >60 mL/min (ref >60)
GLUCOSE: 105 mg/dL — AB (ref 65–99)
Potassium: 3.6 mmol/L (ref 3.5–5.1)
SODIUM: 141 mmol/L (ref 135–145)

## 2017-10-28 MED ORDER — VITAMIN B-1 100 MG PO TABS
100.0000 mg | ORAL_TABLET | Freq: Every day | ORAL | Status: DC
  Administered 2017-10-29 – 2017-10-30 (×2): 100 mg via ORAL
  Filled 2017-10-28 (×3): qty 1

## 2017-10-28 MED ORDER — FOLIC ACID 1 MG PO TABS
1.0000 mg | ORAL_TABLET | Freq: Every day | ORAL | Status: DC
  Administered 2017-10-29 – 2017-10-31 (×3): 1 mg via ORAL
  Filled 2017-10-28 (×3): qty 1

## 2017-10-28 MED ORDER — LEVOFLOXACIN 500 MG PO TABS
500.0000 mg | ORAL_TABLET | Freq: Every day | ORAL | Status: DC
  Administered 2017-10-28 – 2017-10-29 (×2): 500 mg via ORAL
  Filled 2017-10-28 (×2): qty 1

## 2017-10-28 NOTE — Progress Notes (Signed)
Family Medicine Teaching Service Daily Progress Note Intern Pager: [phone]  Patient name: Angelica Bell Medical record number: [id number] Date of birth: [birth year] Age: 83 y.o. Gender: female  Primary Care Provider: Heywood Bene, PA-C Consultants:  Code Status: full  Pt Overview and Major Events to Date:  Angelica Bell is a 83 y.o. female presenting with altered mental status . PMH is significant for hypertension, remote history of PE not on anticoagulation, anxiety, depression, osteoporosis with right femoral neck fracture, peripheral neuropathy, osteoarthritis & chronic pain. Patient placed on suicide precautions and IVC'd after psychiatry evaluation.  Assessment and Plan: Angelica Bell is a 83 y.o. female presenting with altered mental status . PMH is significant for hypertension, remote history of PE not on anticoagulation, anxiety, depression, osteoporosis with right femoral neck fracture, peripheral neuropathy, osteoarthritis & chronic pain. Possible xanax OD per story by son.  Altered mental status: Resolved to baseline per grandson.  Pysch determined capacity and placed on suicide precautions. CK and supportive management. Salicylate, Tylenol level, CK, lactic acid and carboxyhemoglobin already within normal limits. CT head neg for acute process.  Neg troponin.  EEG neg. -Admit to stepdown. Attending Dr. Ardelia Mems -Hold sedating medications -Seizure precautions -CIWA/COWS protocol -Cardiac monitoring 24 hours -Oxygen as needed -Diet regular now as alert and passing nursing swallow test -social work consulted to verify guardianship as there may be a capacity question after acute delirium ruled out  Suicide Attempt- Patient determined to have capacity and OD is determined to be intentional -suicide precautions -IVC -placement with geriatric psych after medically cleared. -confirmed that psych has suspiscions of active suicidal intent  ? Pneumonia: CXR with some  opacities over her left lower lobe concerning for pneumonia. She has no respiratory symptoms. Satting okay on room air -We will start Levaquin. (10/27< )No QTc prolongation  Hypertension: 150s/80s -consider restarting low dose lasix -We'll continue monitoring  Remote history of PE in 1967. Not on anticoagulation. PE well's score 1.5 (prior history of PE). DVT unlikely based on neutrophils total lymphocyte ratio and platelet to lymphocyte ratio  Anxiety/depression: Since she is on Lexapro, Trazodone and Xanax at home. Story from family includes near empty bottle of xanex that had just been refilled. -Hold home medications -CIWA protocol (0,2,3)  Osteoarthritis/chronic pain: Since she is on oxycodone and fentanyl patch at home -Hold oxy -IV fentanyl 25 g q3prn when necessary  Osteoporosis with history of femoral neck fracture:  -Outpatient  FEN/GI: -regular diet -DC fluids as patient is eating regular diet  Prophylaxis: -Lovenox  Disposition:  medically cleared, to geriatric inpatient psych  Subjective:  Patient was sleeping during rounds, sitter said she had been "fussy" but not aggressive at all overnight but she did not sleep until about 5 minutes before arriving.  Patient was too drowsy to interview.  Objective: Temp:  [97.5 F (36.4 C)-98.8 F (37.1 C)] 98.5 F (36.9 C) (10/30 0355) Pulse Rate:  [75-103] 75 (10/30 0355) Resp:  [16-24] 17 (10/30 0355) BP: (142-181)/(66-90) 154/82 (10/30 0355) SpO2:  [97 %-100 %] 97 % (10/30 0355) Weight:  [121 lb (54.9 kg)] 121 lb (54.9 kg) (10/30 0355) Physical Exam: GEN: sleeping comfortably Head: normocephalic and atraumatic  Nares: no rhinorrhea, congestion or erythema  Oropharynx: No dentition CVS: RRR, nl s1 & s2, no murmurs, no edema RESP: no IWOB, good air movement bilaterally, CTAB anteriorly GI: BS present & normal, MSK: no edema SKIN: no apparent skin lesion NEURO: AOx3 with significant memory deficits and  story changing.  CN grossly intact.    Laboratory:  Recent Labs Lab 10/26/17 0219 10/27/17 0338 10/28/17 0401  WBC 8.6 6.1 7.9  HGB 11.3* 10.9* 10.9*  HCT 35.0* 33.9* 33.4*  PLT 247 269 281    Recent Labs Lab 10/25/17 1144 10/26/17 0219 10/27/17 0338 10/28/17 0401  NA 139 140 141 141  K 3.6 3.5 3.4* 3.6  CL 105 105 107 109  CO2 28 24 28 24   BUN 13 8 7 6   CREATININE 0.94 0.75 0.75 0.77  CALCIUM 8.7* 9.0 9.0 9.4  PROT 5.3* 5.2*  --   --   BILITOT 0.3 0.6  --   --   ALKPHOS 62 66  --   --   ALT 12* 11*  --   --   AST 24 23  --   --   GLUCOSE 108* 83 98 105*     Imaging/Diagnostic Tests: Ct Head Wo Contrast  Result Date: 10/25/2017 CLINICAL DATA:  Altered mental status.  History of hypertension. EXAM: CT HEAD WITHOUT CONTRAST TECHNIQUE: Contiguous axial images were obtained from the base of the skull through the vertex without intravenous contrast. COMPARISON:  CT HEAD July 29, 2017 FINDINGS: Mildly motion degraded examination. BRAIN: No intraparenchymal hemorrhage, mass effect nor midline shift. The ventricles and sulci are normal for age. Patchy supratentorial white matter hypodensities within normal range for patient's age, though non-specific are most compatible with chronic small vessel ischemic disease. Old RIGHT cerebellar infarct. No acute large vascular territory infarcts. No abnormal extra-axial fluid collections. Basal cisterns are patent. VASCULAR: Mild to moderate calcific atherosclerosis of the carotid siphons. SKULL: No skull fracture. Severe temporomandibular osteoarthrosis. No significant scalp soft tissue swelling. SINUSES/ORBITS: Small LEFT sphenoid sinus air-fluid level. RIGHT jugular bulb dehiscence. The included ocular globes and orbital contents are non-suspicious. Status post bilateral ocular lens implants. OTHER: None. IMPRESSION: 1. No acute intracranial process on this motion degraded examination. 2. Stable examination including old RIGHT cerebellar  infarct and mild to moderate chronic small vessel ischemic disease. Electronically Signed   By: Elon Alas M.D.   On: 10/25/2017 23:20   Dg Chest Portable 1 View  Result Date: 10/25/2017 CLINICAL DATA:  Altered mental status. EXAM: PORTABLE CHEST 1 VIEW COMPARISON:  07/29/2017 FINDINGS: The cardiomediastinal silhouette is unchanged. Aortic eft cirrhosis is noted. The lungs remain hypoinflated with increased patchy opacity in the left lung base. No sizable pleural effusion or pneumothorax is identified. There is an old fracture of the proximal left humerus. Moderate S-shaped thoracolumbar scoliosis is noted. IMPRESSION: Low lung volumes with increasing left basilar opacity which may reflect atelectasis, pneumonia, or aspiration. Electronically Signed   By: Logan Bores M.D.   On: 10/25/2017 12:18     Sherene Sires, DO 10/28/2017, 6:31 AM PGY-1, [place] Intern pager: [phone], text pages welcome

## 2017-10-28 NOTE — Care Management Note (Signed)
Case Management Note Marvetta Gibbons RN, BSN Unit 4E-Case Manager [phone]  Patient Details  Name: LADAJAH SOLTYS MRN: [id number] Date of Birth: December 23, 1932  Subjective/Objective:   Pt admitted with AMS and overdose                 Action/Plan: PTA pt lived at home alone- per psych eval - pt OD was intentional and an attempted suicide pt will need inpt psychiatric hospitalization- CSW following- pt has sitter and will need IVC as pt is unwilling for treatment at this time.   Expected Discharge Date:                  Expected Discharge Plan:  Psychiatric Hospital  In-House Referral:  Clinical Social Work  Discharge planning Services  CM Consult  Post Acute Care Choice:    Choice offered to:     DME Arranged:    DME Agency:     HH Arranged:    [place] Agency:     Status of Service:  In process, will continue to follow  If discussed at Long Length of Stay Meetings, dates discussed:    Discharge Disposition:   Additional Comments:  Dawayne Patricia, RN 10/28/2017, 1:57 PM

## 2017-10-28 NOTE — Progress Notes (Signed)
Patient restless. Stayed up most tos the night.

## 2017-10-28 NOTE — Progress Notes (Signed)
OT Cancellation Note  Patient Details Name: Angelica Bell MRN: [id number] DOB: Sep 19, 1932   Cancelled Treatment:    Reason Eval/Treat Not Completed: Patient declined, no reason specified. Pt declined to participate with OT this session stating "I just don't feel up to it, I am too tired." Continued to decline after max encouragement provided. Will check back as able.   Norman Herrlich, MS OTR/L  Pager: [phone]   Norman Herrlich 10/28/2017, 12:22 PM

## 2017-10-28 NOTE — Progress Notes (Signed)
PT Cancellation Note  Patient Details Name: Angelica Bell MRN: [id number] DOB: 07/10/1932   Cancelled Treatment:    Reason Eval/Treat Not Completed: Patient declined, no reason specified Despite maximal encouragement pt unwilling to participate in any form of PT treatment today citing flaring peripheral neuropathy pain, not getting any sleep last night, having diarrhea and assorted pains in her shoulder and arms. PT will attempt treatment tomorrow.   Nykia Turko B. Migdalia Dk PT, DPT Acute Rehabilitation  [phone] Pager [phone]     [place] 10/28/2017, 3:33 PM

## 2017-10-29 MED ORDER — FUROSEMIDE 20 MG PO TABS: 20.0000 mg | ORAL_TABLET | Freq: Two times a day (BID) | ORAL | Status: DC

## 2017-10-29 MED ORDER — HYDROCHLOROTHIAZIDE 10 MG/ML ORAL SUSPENSION
6.2500 mg | Freq: Every day | ORAL | Status: DC
  Administered 2017-10-29: 6.25 mg via ORAL
  Filled 2017-10-29 (×2): qty 1.25

## 2017-10-29 MED ORDER — GABAPENTIN 100 MG PO CAPS
100.0000 mg | ORAL_CAPSULE | Freq: Three times a day (TID) | ORAL | Status: DC
  Administered 2017-10-29 – 2017-10-30 (×4): 100 mg via ORAL
  Filled 2017-10-29 (×4): qty 1

## 2017-10-29 NOTE — Progress Notes (Signed)
FPTS Interim Progress Note  S:Paged by SW about certifying IVC paperwork for this patient.  O: BP (!) 167/98 (BP Location: Left Arm)   Pulse 62   Temp 97.9 F (36.6 C) (Axillary)   Resp 20   Ht 5\' 3"  (1.6 m)   Wt 110 lb 9.6 oz (50.2 kg)   SpO2 99%   BMI 19.59 kg/m     A/P: We would like to connect Dr. Mingo Amber and Dr. Lenna Sciara so that they can discuss this patient further before IVC'ing them.  Sitter will be left in place.  Sherene Sires, DO 10/29/2017, 4:43 PM PGY-1, [place] [facility] Service pager [phone]

## 2017-10-29 NOTE — Consult Note (Signed)
[facility] Face-to-Face Psychiatry Consult   Reason for Consult:  AMS and capacity evaluation Referring Physician:  Dr. Ardelia Mems Patient Identification: Angelica Bell Principal Diagnosis: Altered mental status Diagnosis:   Patient Active Problem List   Diagnosis Date Noted  . Drug overdose [T50.901A]   . Polypharmacy [Z79.899]   . Altered mental status [R41.82] 10/25/2017  . Closed displaced intertrochanteric fracture of left femur ([place]) [S72.142A] 02/16/2017  . Hip fracture, right ([place]) [S72.001A] 12/25/2015  . Chronic pain [G89.29] 12/24/2015  . Fracture of femoral neck, right, closed ([place]) [S72.001A] 12/24/2015  . Essential hypertension [I10] 12/24/2015  . Community acquired pneumonia [J18.9] 04/15/2013  . Hypokalemia [E87.6] 04/15/2013  . Labial cyst [N90.7] 02/08/2012  . POSTMENOPAUSAL SYNDROME [N95.9] 11/28/2009  . PERIPHERAL NEUROPATHY, LOWER EXTREMITY, RIGHT [G57.90] 02/22/2009  . INSOMNIA-SLEEP DISORDER-UNSPEC [G47.00] 10/12/2008  . Lumbago [M54.5] 08/02/2008  . 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 ANXIETY DEPRESSION [F34.1] 12/16/2007  . HYPERLIPIDEMIA [E78.5] 07/08/2007  . Osteoarthritis [M19.90] 07/08/2007  . Osteoporosis [M81.0] 07/08/2007  . SCOLIOSIS NEC [M41.80] 07/08/2007    Total Time spent with patient: 1 hour  Subjective:   Angelica Bell is a 83 y.o. female patient admitted with AMS.  HPI:  Angelica Bell is a 83 y.o. female, sitting, chart reviewed for this face-to-face psychiatric consultation and evaluation of altered mental status and possible capacity evaluation. Patient endorses feeling depression anxiety and worries about her brother who was suffered stroke and has been placed in a nursing home. Patient feels embarrassment when talked about possible intentional overdose and stated everybody knows in the hospital where she is here and she does not want to talk about it. Patient reported she has been living by  herself and she has children living in [place] and [place]. She has a daughter was living in another state, does not have much communication. Patient receiving outpatient medication management from medical practice in [place], [place]. Patient denies current suicidal ideation, homicidal ideation and has no evidence of psychosis. Patient denies a history of acute psychiatric hospitalization or substance abuse. Patient meet criteria for inpatient psychiatric hospitalization as she has been depressed and trying to end her life by intentional overdose. Patient urine drug screen is positive for opiates and benzodiazepines. Patient has no current symptoms of opioid and benzodiazepine withdrawals except she has diarrhea which she has to go to the bathroom more frequently.  Medical history: Patient was not awake enough to provide history. History based on collateral information from grandson, Angelica Bell. Per Mr. Bell, patient was in her usual state of health when he saw her last night about 7:15 PM. At that time, she ate her dinner and had no acute concerns or symptoms. When he came back to her house this morning to eat breakfast with her, he found her altered and laying in bed surrounded by her medication bottles. He states that there were only 4 pills of Xanax left in her bottle. He says she filled xanax about 4 days ago. He also noted a glass full of wine on the table. He states they are quite few pills of trazodone and oxycodone bottles but doesn't remember the exact number. He says he is not at her house right now to look into her bottles.  Off note, Mr. Bell claims to be her HCPOA. However, he doesn't have a paper yet. As far as he knows, patient is full code although she was DNR at one point in her previous admission  Past Psychiatric  History: Depression and anxiety.  October 29, 2017 Interval history: Patient seen today for psychiatric consultation follow-up as requested by the teaching  team resident Dr. Sherene Sires.  Patient has been recovered from her altered mental status and has no current withdrawal symptoms of benzodiazepines.  Patient complaining about neuropathic pains in her legs and having trouble sleeping due to pain daily night and frustrated that no medication was offered.  Patient stated that "I do not know" what happened when she was taken overdose of her medication and stated she was found by her grandson who called emergency medical person which saved her life.  Patient stated if her grandson does not come and found her she would have died.  Patient stated she regrets about her incident and bluntly stated that is not going to happen again to her.  She could not recall the incident regarding what made her to take the medication, how many pills she was taken and she was not drunk or not.  Patient grandson reported that she does not drink regularly and he does not know what exactly happened but he assumes it may be unintentional overdose.  Based on risk factors patient has been living alone, has some medical problems which were not controlled and also stressed about her brother being in a nursing home after stroke.  Patient getting frustrated when offering about psychiatric services including counseling saying that she does not want to be psychiatric patient.  Patient also asked psychiatric consultation should be discontinued.  Due to lack of clear understanding about her intention to take her medication or no reason to think it is an accidental overdose, the psychiatrist strongly believes she has intentional overdose and cannot be contract for safety during this hospitalization.    Risk to Self: Is patient at risk for suicide?: No Risk to Others:   Prior Inpatient Therapy:   Prior Outpatient Therapy:    Past Medical History:  Past Medical History:  Diagnosis Date  . Chronic back pain    with degenerative joint disease  . Chronic pain disorder    with narcotic management   . Dry eyes   . Hypertension   . Osteoporosis   . PE (pulmonary embolism)    After hysterectomy in 1967  . Pneumonia     Past Surgical History:  Procedure Laterality Date  . ABDOMINAL HYSTERECTOMY    . APPENDECTOMY    . BREAST ENHANCEMENT SURGERY    . FEMUR IM NAIL Left 02/17/2017   Procedure: INTRAMEDULLARY (IM) NAIL FEMORAL;  Surgeon: Nicholes Stairs, MD;  Location: [facility];  Service: Orthopedics;  Laterality: Left;  . HIP ARTHROPLASTY Right 12/24/2015   Procedure: ARTHROPLASTY BIPOLAR HIP (HEMIARTHROPLASTY);  Surgeon: Netta Cedars, MD;  Location: [facility];  Service: Orthopedics;  Laterality: Right;  . TONSILLECTOMY     Family History:  Family History  Problem Relation Age of Onset  . Heart Problems Mother    Family Psychiatric  History: Unknown Social History:  History  Alcohol Use No     History  Drug Use No    Social History   Social History  . Marital status: Widowed    Spouse name: N/A  . Number of children: 3  . Years of education: HS   Occupational History  . Retired Retired   Social History Main Topics  . Smoking status: Former Smoker    Years: 13.00    Quit date: 04/15/1976  . Smokeless tobacco: Never Used  . Alcohol use No  .  Drug use: No  . Sexual activity: No   Other Topics Concern  . None   Social History Narrative   Patient lives at home with her grandson.   Caffeine Use: none   Additional Social History:    Allergies:   Allergies  Allergen Reactions  . Amoxicillin-Pot Clavulanate Hives and Diarrhea    Has patient had a PCN reaction causing immediate rash, facial/tongue/throat swelling, SOB or lightheadedness with hypotension: yes Has patient had a PCN reaction causing severe rash involving mucus membranes or skin necrosis: yes Has patient had a PCN reaction that required hospitalization : unknown Has patient had a PCN reaction occurring within the last 10 years: yes If all of the above answers are "NO", then may proceed with  Cephalosporin use.   Marland Kitchen Lisinopril-Hydrochlorothiazide Other (See Comments)    Tired- no energy to do anything..  . Sulfonamide Derivatives Hives    Labs:  Results for orders placed or performed during the hospital encounter of 10/25/17 (from the past 48 hour(s))  CBC     Status: Abnormal   Collection Time: 10/28/17  4:01 AM  Result Value Ref Range   WBC 7.9 4.0 - 10.5 K/uL   RBC 4.00 3.87 - 5.11 MIL/uL   Hemoglobin 10.9 (L) 12.0 - 15.0 g/dL   HCT 33.4 (L) 36.0 - 46.0 %   MCV 83.5 78.0 - 100.0 fL   MCH 27.3 26.0 - 34.0 pg   MCHC 32.6 30.0 - 36.0 g/dL   RDW 14.4 11.5 - 15.5 %   Platelets 281 150 - 400 K/uL  Basic metabolic panel     Status: Abnormal   Collection Time: 10/28/17  4:01 AM  Result Value Ref Range   Sodium 141 135 - 145 mmol/L   Potassium 3.6 3.5 - 5.1 mmol/L   Chloride 109 101 - 111 mmol/L   CO2 24 22 - 32 mmol/L   Glucose, Bld 105 (H) 65 - 99 mg/dL   BUN 6 6 - 20 mg/dL   Creatinine, Ser 0.77 0.44 - 1.00 mg/dL   Calcium 9.4 8.9 - 10.3 mg/dL   GFR calc non Af Amer >60 >60 mL/min   GFR calc Af Amer >60 >60 mL/min    Comment: (NOTE) The eGFR has been calculated using the CKD EPI equation. This calculation has not been validated in all clinical situations. eGFR's persistently <60 mL/min signify possible Chronic Kidney Disease.    Anion gap 8 5 - 15    Current Facility-Administered Medications  Medication Dose Route Frequency Provider Last Rate Last Dose  . acetaminophen (TYLENOL) tablet 650 mg  650 mg Oral Q6H PRN Wendee Beavers T, MD   650 mg at 10/29/17 0520   Or  . acetaminophen (TYLENOL) suppository 650 mg  650 mg Rectal Q6H PRN Gonfa, Taye T, MD      . enoxaparin (LOVENOX) injection 40 mg  40 mg Subcutaneous Q24H Wendee Beavers T, MD   40 mg at 10/26/17 2135  . fentaNYL (SUBLIMAZE) injection 25 mcg  25 mcg Intravenous Q2H PRN Sela Hilding, MD   25 mcg at 10/28/17 2255  . folic acid (FOLVITE) tablet 1 mg  1 mg Oral Daily Alveda Reasons, MD   1 mg at  10/29/17 1107  . gabapentin (NEURONTIN) capsule 100 mg  100 mg Oral TID Sherene Sires, DO   100 mg at 10/29/17 1107  . hydrochlorothiazide 10 mg/mL oral suspension 6.25 mg  6.25 mg Oral Daily Bland, Scott, DO      .  Influenza vac split quadrivalent PF (FLUZONE HIGH-DOSE) injection 0.5 mL  0.5 mL Intramuscular Tomorrow-1000 Leeanne Rio, MD      . levofloxacin [facility]) tablet 500 mg  500 mg Oral Daily Alveda Reasons, MD   500 mg at 10/28/17 2110  . LORazepam (ATIVAN) tablet 1 mg  1 mg Oral Once Sherene Sires, DO       Or  . LORazepam (ATIVAN) injection 1 mg  1 mg Intramuscular Once Sherene Sires, DO      . LORazepam (ATIVAN) injection 2-3 mg  2-3 mg Intravenous Q1H PRN Wendee Beavers T, MD   2 mg at 10/29/17 0023  . polyethylene glycol (MIRALAX / GLYCOLAX) packet 17 g  17 g Oral Daily PRN Wendee Beavers T, MD      . thiamine (VITAMIN B-1) tablet 100 mg  100 mg Oral Daily Alveda Reasons, MD   100 mg at 10/29/17 1108    Musculoskeletal: Strength & Muscle Tone: decreased Gait & Station: normal Patient leans: Front  Psychiatric Specialty Exam: Physical Exam  as per history and physical   ROS  patient has been depressed, anxious and status post intentional overdose and does not want to talk much about. Patient complaining about ongoing bathroom more frequently.  No Fever-chills, No Headache, No changes with Vision or hearing, reports vertigo No problems swallowing food or Liquids, No Chest pain, Cough or Shortness of Breath, No Abdominal pain, No Nausea or Vommitting, Bowel movements are regular, No Blood in stool or Urine, No dysuria, No new skin rashes or bruises, No new joints pains-aches,  No new weakness, tingling, numbness in any extremity, No recent weight gain or loss, No polyuria, polydypsia or polyphagia,  A full 10 point Review of Systems was done, except as stated above, all other Review of Systems were negative.  Blood pressure (!) 174/92, pulse 92, temperature  97.6 F (36.4 C), temperature source Oral, resp. rate 20, height _0  (1.6 m), weight 50.2 kg (110 lb 9.6 oz), SpO2 98 %.Body mass index is 19.59 kg/m.  General Appearance: Guarded  Eye Contact:  Good  Speech:  Clear and Coherent  Volume:  Normal  Mood:  Anxious and Depressed  Affect:  Congruent and Depressed  Thought Process:  Coherent and Goal Directed  Orientation:  Full (Time, Place, and Person)  Thought Content:  Rumination  Suicidal Thoughts:  Yes.  with intent/plan  Homicidal Thoughts:  No  Memory:  Immediate;   Good Recent;   Fair Remote;   Fair  Judgement:  Impaired  Insight:  Fair  Psychomotor Activity:  Decreased  Concentration:  Concentration: Good and Attention Span: Good  Recall:  Good  Fund of Knowledge:  Good  Language:  Good  Akathisia:  Negative  Handed:  Right  AIMS (if indicated):     Assets:  Communication Skills Desire for Improvement Financial Resources/Insurance Housing Leisure Time Resilience Social Support Talents/Skills Transportation  ADL's:  Intact  Cognition:  WNL  Sleep:        Treatment Plan Summary: 83 years old female with history of depression anxiety presented with altered mental status. Patient was not willing to talk about her intentional drug overdose as a suicide attempt. Patient mental status has been improved and has intact cognitions but poorly cooperative and easily getting frustrated to talk about the incident.  She is highly prone for future intentional drug overdose as a suicide attempt.  Major depressive disorder, recurrent, severe without psychotic symptoms Status post intentional drug overdose  Recommendation: Patient will benefit from involuntary commitment with the psychiatric hospitalization for safety monitoring and possible medication therapy and counseling services.  Referred to the unit CSW for geriatric psychiatric inpatient psychiatric hospitalization.  Required involuntary commitment as patient is not  willing for treatment including medication management of depression and counseling services for multiple psychosocial stresses during this visit.   Disposition: Recommend psychiatric Inpatient admission when medically cleared. Supportive therapy provided about ongoing stressors.  Ambrose Finland, MD 10/29/2017 1:44 PM

## 2017-10-29 NOTE — Care Management Important Message (Signed)
Important Message  Patient Details  Name: Angelica Bell MRN: [id number] Date of Birth: 1932/02/19   Medicare Important Message Given:  Yes    Addaline Peplinski Abena 10/29/2017, 10:05 AM

## 2017-10-29 NOTE — Progress Notes (Signed)
Family Medicine Teaching Service Daily Progress Note Intern Pager: [phone]  Patient name: Angelica Bell Medical record number: [id number] Date of birth: [birth year] Age: 83 y.o. Gender: female  Primary Care Provider: Heywood Bene, PA-C Consultants:  Code Status: full  Pt Overview and Major Events to Date:  Angelica Bell is a 83 y.o. female presenting with altered mental status . PMH is significant for hypertension, remote history of PE not on anticoagulation, anxiety, depression, osteoporosis with right femoral neck fracture, peripheral neuropathy, osteoarthritis & chronic pain. Patient placed on suicide precautions and IVC'd after psychiatry evaluation.  Assessment and Plan: Angelica Bell is a 83 y.o. female presenting with altered mental status . PMH is significant for hypertension, remote history of PE not on anticoagulation, anxiety, depression, osteoporosis with right femoral neck fracture, peripheral neuropathy, osteoarthritis & chronic pain. Possible xanax OD per story by son.  Altered mental status: Resolved to baseline per grandson.  Pysch determined capacity and placed on suicide precautions. CK and supportive management. Salicylate, Tylenol level, CK, lactic acid and carboxyhemoglobin already within normal limits. CT head neg for acute process.  Neg troponin.  EEG neg. -Admit to stepdown. Attending Dr. Ardelia Mems -Hold sedating medications -Seizure precautions -CIWA/COWS protocol -Cardiac monitoring 24 hours -Oxygen as needed -Diet regular now as alert and passing nursing swallow test -social work consulted to verify guardianship as there may be a capacity question after acute delirium ruled out  Suicide Attempt- *currently denies SI* Patient determined to have capacity and OD was determined to be intentional -suicide precautions -IVC -placement with geriatric psych after medically cleared. -will discuss re-evaluation by psych as patient not claiming SI on  rounds  ? Pneumonia: CXR with some opacities over her left lower lobe concerning for pneumonia. She has no respiratory symptoms. Satting okay on room air- lung sounds CTA bilaterally -We will start Levaquin. (10/27< )No QTc prolongation  Hypertension: 150s/80s -home lasix 20 BID -We'll continue monitoring  Remote history of PE in 1967. Not on anticoagulation. PE well's score 1.5 (prior history of PE). DVT unlikely based on neutrophils total lymphocyte ratio and platelet to lymphocyte ratio  Anxiety/depression: Since she is on Lexapro, Trazodone and Xanax at home. Story from family includes near empty bottle of xanex that had just been refilled. -Hold home medications -CIWA protocol (0,2,3)  Osteoarthritis/chronic pain: Since she is on oxycodone and fentanyl patch at home -Hold oxy -IV fentanyl 25 g q3prn when necessary  Osteoporosis with history of femoral neck fracture:  -Outpatient  Neuropathic pain in feet/LE -will try gabapentin 100 TID  FEN/GI: -regular diet -DC fluids as patient is eating regular diet  Prophylaxis: -Lovenox  Disposition:  medically cleared, to geriatric inpatient psych if re-evaluation does not clear SI concerns  Subjective:  Patient awake and pleasant during rounds.   Emphatic that she did not try to and does not want to harm herself.  Spoke proudly of her grandson Angelica Bell and how she loves his children.   She believes, despite reassurances otherwise, that psychiatry "only care about money".  She requested to call her grandson and nursing will arrange as she can't have her phone right now  Objective: Temp:  [97.6 F (36.4 C)-98.3 F (36.8 C)] 97.7 F (36.5 C) (10/31 0800) Pulse Rate:  [74-97] 92 (10/31 0800) Resp:  [13-24] 24 (10/31 0800) BP: (150-178)/(75-115) 156/91 (10/31 0800) SpO2:  [91 %-98 %] 98 % (10/31 0800) Weight:  [110 lb 9.6 oz (50.2 kg)] 110 lb 9.6 oz (50.2 kg) (10/31 0500)  Physical Exam: GEN: sleeping comfortably Head:  normocephalic and atraumatic  Nares: no rhinorrhea, congestion or erythema  Oropharynx: No dentition CVS: RRR, nl s1 & s2, no murmurs, no edema RESP: no IWOB, good air movement bilaterally, CTAB anteriorly GI: BS present & normal, MSK: no edema SKIN: no apparent skin lesion NEURO: AOx3 with significant memory deficits and story changing.  CN grossly intact.    Laboratory:  Recent Labs Lab 10/26/17 0219 10/27/17 0338 10/28/17 0401  WBC 8.6 6.1 7.9  HGB 11.3* 10.9* 10.9*  HCT 35.0* 33.9* 33.4*  PLT 247 269 281    Recent Labs Lab 10/25/17 1144 10/26/17 0219 10/27/17 0338 10/28/17 0401  NA 139 140 141 141  K 3.6 3.5 3.4* 3.6  CL 105 105 107 109  CO2 28 24 28 24   BUN 13 8 7 6   CREATININE 0.94 0.75 0.75 0.77  CALCIUM 8.7* 9.0 9.0 9.4  PROT 5.3* 5.2*  --   --   BILITOT 0.3 0.6  --   --   ALKPHOS 62 66  --   --   ALT 12* 11*  --   --   AST 24 23  --   --   GLUCOSE 108* 83 98 105*     Imaging/Diagnostic Tests: Ct Head Wo Contrast  Result Date: 10/25/2017 CLINICAL DATA:  Altered mental status.  History of hypertension. EXAM: CT HEAD WITHOUT CONTRAST TECHNIQUE: Contiguous axial images were obtained from the base of the skull through the vertex without intravenous contrast. COMPARISON:  CT HEAD July 29, 2017 FINDINGS: Mildly motion degraded examination. BRAIN: No intraparenchymal hemorrhage, mass effect nor midline shift. The ventricles and sulci are normal for age. Patchy supratentorial white matter hypodensities within normal range for patient's age, though non-specific are most compatible with chronic small vessel ischemic disease. Old RIGHT cerebellar infarct. No acute large vascular territory infarcts. No abnormal extra-axial fluid collections. Basal cisterns are patent. VASCULAR: Mild to moderate calcific atherosclerosis of the carotid siphons. SKULL: No skull fracture. Severe temporomandibular osteoarthrosis. No significant scalp soft tissue swelling. SINUSES/ORBITS: Small  LEFT sphenoid sinus air-fluid level. RIGHT jugular bulb dehiscence. The included ocular globes and orbital contents are non-suspicious. Status post bilateral ocular lens implants. OTHER: None. IMPRESSION: 1. No acute intracranial process on this motion degraded examination. 2. Stable examination including old RIGHT cerebellar infarct and mild to moderate chronic small vessel ischemic disease. Electronically Signed   By: Elon Alas M.D.   On: 10/25/2017 23:20   Dg Chest Portable 1 View  Result Date: 10/25/2017 CLINICAL DATA:  Altered mental status. EXAM: PORTABLE CHEST 1 VIEW COMPARISON:  07/29/2017 FINDINGS: The cardiomediastinal silhouette is unchanged. Aortic eft cirrhosis is noted. The lungs remain hypoinflated with increased patchy opacity in the left lung base. No sizable pleural effusion or pneumothorax is identified. There is an old fracture of the proximal left humerus. Moderate S-shaped thoracolumbar scoliosis is noted. IMPRESSION: Low lung volumes with increasing left basilar opacity which may reflect atelectasis, pneumonia, or aspiration. Electronically Signed   By: Logan Bores M.D.   On: 10/25/2017 12:18     Sherene Sires, DO 10/29/2017, 9:09 AM PGY-1, [place] Intern pager: [phone], text pages welcome

## 2017-10-29 NOTE — Progress Notes (Signed)
Physical Therapy Treatment Patient Details Name: Angelica Bell MRN: [id number] DOB: 1932-01-21 Today's Date: 10/29/2017    History of Present Illness 83 y.o. female presenting with altered mental status . Altered mental status likely due to overdose with benzo and opiates based on history and UDS.  PMH is significant for hypertension, remote history of PE not on anticoagulation, anxiety, depression, osteoporosis with right femoral neck fracture (2016), fall with L hip fx (01/2017), peripheral neuropathy, osteoarthritis & chronic pain.     PT Comments    At first pt declined PT because didn't want to walk in hall. Agreed to perform exercises in bed. Pt performed few reps of exercises and stated she didn't want to do anymore because was fatigued and scared neuropathic pain would start. With encouragement would perform a few more sets. Worked on bed mobility and safe sit to stands. Pt able to scoot up in flat bed with use of rails. Bridges for bed mobility and strength. Pt able to physically perform exercises, sit to stands and bed mobility but lacks emotional motivation.   Follow Up Recommendations  Supervision/Assistance - 24 hour;Home health PT     Equipment Recommendations  None recommended by PT    Recommendations for Other Services       Precautions / Restrictions Precautions Precautions: Fall Restrictions Weight Bearing Restrictions: No    Mobility  Bed Mobility Overal bed mobility: Modified Independent Bed Mobility: Supine to Sit;Sit to Supine     Supine to sit: Modified independent (Device/Increase time);HOB elevated Sit to supine: Modified independent (Device/Increase time);HOB elevated   General bed mobility comments: Pt able to supine to sit/sit to supine in flat bed with increased time and effort.   Transfers Overall transfer level: Needs assistance   Transfers: Sit to/from Stand Sit to Stand: Min guard         General transfer comment: Pt sat to stand 2  times from bed; Min guard for safety.  Ambulation/Gait             General Gait Details: Pt refused to walk in hallway due to fear of being judged for her outfit. Refused to ambulate in room due to having just showered and just gotten back in bed.   Stairs            Wheelchair Mobility    Modified Rankin (Stroke Patients Only)       Balance Overall balance assessment: Needs assistance Sitting-balance support: No upper extremity supported;Feet supported Sitting balance-Leahy Scale: Good     Standing balance support: No upper extremity supported Standing balance-Leahy Scale: Poor Standing balance comment: Pt able to get to standing but not able to maintain for long due to neuropathy pain in LEs.                            Cognition Arousal/Alertness: Awake/alert Behavior During Therapy: Anxious Overall Cognitive Status: Within Functional Limits for tasks assessed Area of Impairment: Memory                     Memory: Decreased short-term memory         General Comments: Pt anxious about when return home and when receiving neuropathy meds. Fearful of being judged and labeled if go out into hallway.      Exercises Total Joint Exercises Bridges: AROM;Both;5 reps General Exercises - Upper Extremity Shoulder Flexion: AROM;Both;10 reps;Supine Shoulder ABduction: AROM;Both;10 reps;Supine General Exercises - Lower Extremity Ankle  Circles/Pumps: AROM;Both;15 reps;Supine Quad Sets: AROM;Both;5 reps Straight Leg Raises: AROM;Both;10 reps;Supine    General Comments        Pertinent Vitals/Pain Pain Assessment: 0-10 Pain Score: 2  (When performing supine LE exercises) Pain Location: B hips Pain Descriptors / Indicators: Grimacing;Guarding;Sore Pain Intervention(s): Limited activity within patient's tolerance;Monitored during session;Repositioned    Home Living                      Prior Function            PT Goals  (current goals can now be found in the care plan section) Acute Rehab PT Goals Patient Stated Goal: home PT Goal Formulation: With patient Time For Goal Achievement: 11/03/17 Potential to Achieve Goals: Fair Progress towards PT goals: Progressing toward goals    Frequency    Min 3X/week      PT Plan Current plan remains appropriate    Co-evaluation              AM-PAC PT "6 Clicks" Daily Activity  Outcome Measure  Difficulty turning over in bed (including adjusting bedclothes, sheets and blankets)?: A Little Difficulty moving from lying on back to sitting on the side of the bed? : A Little Difficulty sitting down on and standing up from a chair with arms (e.g., wheelchair, bedside commode, etc,.)?: A Little Help needed moving to and from a bed to chair (including a wheelchair)?: A Little Help needed walking in hospital room?: A Little Help needed climbing 3-5 steps with a railing? : A Lot 6 Click Score: 17    End of Session   Activity Tolerance: Patient limited by pain;Patient limited by fatigue Patient left: in bed;with call bell/phone within reach;with nursing/sitter in room Nurse Communication: Mobility status PT Visit Diagnosis: History of falling (Z91.81);Other abnormalities of gait and mobility (R26.89)     Time: 2035-5974 PT Time Calculation (min) (ACUTE ONLY): 20 min  Charges:  $Therapeutic Exercise: 8-22 mins                    G Codes:       Janna Arch, SPTA   Janna Arch 10/29/2017, 2:49 PM

## 2017-10-30 DIAGNOSIS — G629 Polyneuropathy, unspecified: Secondary | ICD-10-CM

## 2017-10-30 DIAGNOSIS — G6289 Other specified polyneuropathies: Secondary | ICD-10-CM

## 2017-10-30 LAB — BASIC METABOLIC PANEL
ANION GAP: 9 (ref 5–15)
BUN: 20 mg/dL (ref 6–20)
CALCIUM: 9.3 mg/dL (ref 8.9–10.3)
CHLORIDE: 99 mmol/L — AB (ref 101–111)
CO2: 24 mmol/L (ref 22–32)
Creatinine, Ser: 0.82 mg/dL (ref 0.44–1.00)
GFR calc Af Amer: >60 mL/min (ref >60)
GFR calc non Af Amer: >60 mL/min (ref >60)
GLUCOSE: 141 mg/dL — AB (ref 65–99)
Potassium: 3.6 mmol/L (ref 3.5–5.1)
Sodium: 132 mmol/L — ABNORMAL LOW (ref 135–145)

## 2017-10-30 MED ORDER — GABAPENTIN 100 MG PO CAPS
200.0000 mg | ORAL_CAPSULE | Freq: Three times a day (TID) | ORAL | Status: DC
  Administered 2017-10-30 – 2017-10-31 (×4): 200 mg via ORAL
  Filled 2017-10-30 (×5): qty 2

## 2017-10-30 MED ORDER — TRAMADOL HCL 50 MG PO TABS
50.0000 mg | ORAL_TABLET | Freq: Once | ORAL | Status: AC
  Administered 2017-10-30: 50 mg via ORAL
  Filled 2017-10-30: qty 1

## 2017-10-30 NOTE — Progress Notes (Signed)
Family Medicine Teaching Service Daily Progress Note Intern Pager: [phone]  Patient name: Angelica Bell Medical record number: [id number] Date of birth: 06-Dec-1932 Age: 83 y.o. Gender: female  Primary Care Provider: Heywood Bene, PA-C Consultants:  Code Status: full  Pt Overview and Major Events to Date:  Angelica Bell is a 83 y.o. female presenting with altered mental status . PMH is significant for hypertension, remote history of PE not on anticoagulation, anxiety, depression, osteoporosis with right femoral neck fracture, peripheral neuropathy, osteoarthritis & chronic pain. Patient placed on suicide precautions after psychiatry evaluation.  Assessment and Plan: Angelica Bell is a 83 y.o. female presenting with altered mental status . PMH is significant for hypertension, remote history of PE not on anticoagulation, anxiety, depression, osteoporosis with right femoral neck fracture, peripheral neuropathy, osteoarthritis & chronic pain. Possible xanax OD per story by son.  Altered mental status: Resolved to baseline per grandson.  Pysch determined capacity and placed on suicide precautions.  -on med/surg w/ suicide precautions -Hold sedating medications -Seizure precautions -CIWA/COWS protocol (=<2 overnight) -Oxygen as needed  Suicide Attempt- *currently denies SI* Patient determined to have capacity and OD was determined to be intentional -suicide precautions -IVC/inpatient psych to be discussed further between [facility] service and psych  ? Pneumonia: CXR with some opacities over her left lower lobe concerning for pneumonia. She has no respiratory symptoms.  -We will start Levaquin. (10/27< )No QTc prolongation D/C after 11/3 for 5 day course  Hypertension: 139/92 -d/c HCTZ in light of new hyponatremia for concern of TIH  Hyponatremia- 132, down from 141 2days ago.  Concern for thiazide contribution -D/C HCTZ  Anxiety/depression: Since she was on Lexapro, Trazodone  and Xanax at home. Story from family includes near empty bottle of xanex that had just been refilled.  Cconcern for lexapro contributing to potential intentional suicide. -NO LEXAPRO  Osteoarthritis/chronic pain: Since she is on oxycodone and fentanyl patch at home -Hold oxy -IV fentanyl 25 g q3prn when necessary  Osteoporosis with history of femoral neck fracture:  -Outpatient  Neuropathic pain in feet/LE- requested more pain meds above gabapentin -will increase gabapentin 200 TID  FEN/GI: -regular diet -DC fluids as patient is eating regular diet  Prophylaxis: -Lovenox  Disposition:  medically cleared to d/c but still question over IVC/vs home  Subjective:  Patient denies SI and says her only physical complaint is neuropathic foot pain.  Refuses any discussion of home health assistance as she values autonomy.   Is convinced psychiatry is after her money despite repeated assurances otherwise  Objective: Temp:  [97.5 F (36.4 C)-98.4 F (36.9 C)] 97.5 F (36.4 C) (11/01 0328) Pulse Rate:  [62-100] 94 (11/01 0328) Resp:  [20-27] 24 (11/01 0328) BP: (139-174)/(82-98) 139/92 (11/01 0328) SpO2:  [95 %-99 %] 95 % (11/01 0328) Physical Exam: GEN: sleeping comfortably Head: normocephalic and atraumatic  Nares: no rhinorrhea, congestion or erythema  Oropharynx: No dentition CVS: RRR, nl s1 & s2, no murmurs, no edema RESP: no IWOB, good air movement bilaterally, CTAB anteriorly GI: BS present & normal, MSK: no edema SKIN: no apparent skin lesion NEURO: AOx3 with significant memory deficits.  CN grossly intact.    Laboratory:  Recent Labs Lab 10/26/17 0219 10/27/17 0338 10/28/17 0401  WBC 8.6 6.1 7.9  HGB 11.3* 10.9* 10.9*  HCT 35.0* 33.9* 33.4*  PLT 247 269 281    Recent Labs Lab 10/25/17 1144 10/26/17 0219 10/27/17 0338 10/28/17 0401 10/30/17 0220  NA 139 140 141 141 132*  K 3.6 3.5 3.4* 3.6 3.6  CL 105 105 107 109 99*  CO2 28 24 28 24 24   BUN 13 8  7 6 20   CREATININE 0.94 0.75 0.75 0.77 0.82  CALCIUM 8.7* 9.0 9.0 9.4 9.3  PROT 5.3* 5.2*  --   --   --   BILITOT 0.3 0.6  --   --   --   ALKPHOS 62 66  --   --   --   ALT 12* 11*  --   --   --   AST 24 23  --   --   --   GLUCOSE 108* 83 98 105* 141*     Imaging/Diagnostic Tests: Ct Head Wo Contrast  Result Date: 10/25/2017 CLINICAL DATA:  Altered mental status.  History of hypertension. EXAM: CT HEAD WITHOUT CONTRAST TECHNIQUE: Contiguous axial images were obtained from the base of the skull through the vertex without intravenous contrast. COMPARISON:  CT HEAD July 29, 2017 FINDINGS: Mildly motion degraded examination. BRAIN: No intraparenchymal hemorrhage, mass effect nor midline shift. The ventricles and sulci are normal for age. Patchy supratentorial white matter hypodensities within normal range for patient's age, though non-specific are most compatible with chronic small vessel ischemic disease. Old RIGHT cerebellar infarct. No acute large vascular territory infarcts. No abnormal extra-axial fluid collections. Basal cisterns are patent. VASCULAR: Mild to moderate calcific atherosclerosis of the carotid siphons. SKULL: No skull fracture. Severe temporomandibular osteoarthrosis. No significant scalp soft tissue swelling. SINUSES/ORBITS: Small LEFT sphenoid sinus air-fluid level. RIGHT jugular bulb dehiscence. The included ocular globes and orbital contents are non-suspicious. Status post bilateral ocular lens implants. OTHER: None. IMPRESSION: 1. No acute intracranial process on this motion degraded examination. 2. Stable examination including old RIGHT cerebellar infarct and mild to moderate chronic small vessel ischemic disease. Electronically Signed   By: Elon Alas M.D.   On: 10/25/2017 23:20   Dg Chest Portable 1 View  Result Date: 10/25/2017 CLINICAL DATA:  Altered mental status. EXAM: PORTABLE CHEST 1 VIEW COMPARISON:  07/29/2017 FINDINGS: The cardiomediastinal silhouette is  unchanged. Aortic eft cirrhosis is noted. The lungs remain hypoinflated with increased patchy opacity in the left lung base. No sizable pleural effusion or pneumothorax is identified. There is an old fracture of the proximal left humerus. Moderate S-shaped thoracolumbar scoliosis is noted. IMPRESSION: Low lung volumes with increasing left basilar opacity which may reflect atelectasis, pneumonia, or aspiration. Electronically Signed   By: Logan Bores M.D.   On: 10/25/2017 12:18     Sherene Sires, DO 10/30/2017, 6:20 AM PGY-1, [facility] Intern pager: [phone], text pages welcome

## 2017-10-30 NOTE — Plan of Care (Signed)
Problem: Pain Managment: Goal: General experience of comfort will improve Outcome: Not Progressing Patient states her pain is uncontrolled. MD aware.

## 2017-10-30 NOTE — Progress Notes (Signed)
CSW following patient for support and disposition plan. At this time patient has been seen by psychiatry twice.. Family Medicine is following patient and will be meeting with psychiatry to discuss patients care (please read note from Dr. Esmond Camper dated for 11/01). CSW spoke to [facility] and at this time he does not want CSW to pursue IVC until both MDs have spoken with each other. CSW will continue to follow patient until disposition plan is clear.   Rhea Pink, MSW,  [place]

## 2017-10-30 NOTE — Progress Notes (Signed)
FMTS Attending Daily Note:  Subjective: Patient's only real complaint is being held "against my will" in the hospital.  States that her neuropathic pain is still present but has greatly improved.  Exam: Patient is awake and alert.  She is easily able to sit up in bed.  She can hold a coherent and cogent conversation with me.  She remembers taking "some pills" Saturday night to help relieve her pain.  She is alert and oriented x4 otherwise.  Heart is regular rate and rhythm.  Lungs are clear.  Moves all limbs symmetrically.  Impression/plan: 1.  Drug overdose: -The major question is whether this was intentional versus unintentional. -Patient continues to adamantly deny suicide attempt this past weekend.  Not currently suicidal. -She tells me today similar to yesterday that she has many things to live for including her great grandchild and her grandson.   -Appreciate psychiatric input.  Per psychiatry patient has medical capacity to make her decisions but should be involuntarily committed due to suicide attempt and inability to contract for safety. -We seem to be added in past.  Appreciate the fact that psychiatry has expertise in the area of mental health.  That said, I am unclear that this was a true suicide attempt.  Taking her at her word she is currently not suicidal. -We will contact psychiatry to see if we can have another opinion.  If 2 physicians agree that she is suicidal we will go ahead and involuntarily commit her.  2.  Peripheral neuropathy: - much improved on gabapentin  3.  Altered mental status: -Secondary to drug overdose this weekend. -I agree that it would ultimately be in the patient's best interest to at least have home health if not agree to SNF at discharge. -However she does have medical capacity to refuse these decisions.  We should respect her autonomy in this.  4.  Dispo: - pending resolution of #1 above.    Alveda Reasons, MD 10/30/2017 1:21 PM

## 2017-10-31 DIAGNOSIS — G629 Polyneuropathy, unspecified: Secondary | ICD-10-CM

## 2017-10-31 DIAGNOSIS — T424X2A Poisoning by benzodiazepines, intentional self-harm, initial encounter: Secondary | ICD-10-CM

## 2017-10-31 DIAGNOSIS — T39314A Poisoning by propionic acid derivatives, undetermined, initial encounter: Secondary | ICD-10-CM

## 2017-10-31 LAB — BASIC METABOLIC PANEL
Anion gap: 9 (ref 5–15)
BUN: 26 mg/dL — AB (ref 6–20)
CO2: 26 mmol/L (ref 22–32)
CREATININE: 1.09 mg/dL — AB (ref 0.44–1.00)
Calcium: 9.7 mg/dL (ref 8.9–10.3)
Chloride: 103 mmol/L (ref 101–111)
GFR calc Af Amer: 53 mL/min — ABNORMAL LOW (ref >60)
GFR, EST NON AFRICAN AMERICAN: 46 mL/min — AB (ref >60)
GLUCOSE: 167 mg/dL — AB (ref 65–99)
Potassium: 3.4 mmol/L — ABNORMAL LOW (ref 3.5–5.1)
SODIUM: 138 mmol/L (ref 135–145)

## 2017-10-31 MED ORDER — GABAPENTIN 100 MG PO CAPS: 200.0000 mg | ORAL_CAPSULE | Freq: Three times a day (TID) | ORAL | 0 refills | Status: DC

## 2017-10-31 MED ORDER — THIAMINE HCL 100 MG PO TABS: 100.0000 mg | ORAL_TABLET | Freq: Every day | ORAL | 0 refills | Status: AC

## 2017-10-31 MED ORDER — FOLIC ACID 1 MG PO TABS: 1.0000 mg | ORAL_TABLET | Freq: Every day | ORAL | 0 refills | Status: AC

## 2017-10-31 MED ORDER — TRAMADOL HCL 50 MG PO TABS
50.0000 mg | ORAL_TABLET | Freq: Once | ORAL | Status: AC
  Administered 2017-10-31: 50 mg via ORAL
  Filled 2017-10-31: qty 1

## 2017-10-31 MED ORDER — TRAMADOL HCL 50 MG PO TABS: 50.0000 mg | ORAL_TABLET | Freq: Every day | ORAL | 0 refills | Status: AC

## 2017-10-31 NOTE — Progress Notes (Signed)
CSW staffed case with supervisor, [name]. Awaiting psychiatry re-evaluation for suicidality before deciding on IVC or referral for gero-psych placement. CSW to follow and support with disposition plan.  Angelica Bell, [place]

## 2017-10-31 NOTE — Progress Notes (Signed)
Physical Therapy Treatment Patient Details Name: Angelica Bell MRN: [id number] DOB: 1932/03/28 Today's Date: 10/31/2017    History of Present Illness 83 y.o. female presenting with altered mental status . Altered mental status likely due to overdose with benzo and opiates based on history and UDS.  PMH is significant for hypertension, remote history of PE not on anticoagulation, anxiety, depression, osteoporosis with right femoral neck fracture (2016), fall with L hip fx (01/2017), peripheral neuropathy, osteoarthritis & chronic pain.     PT Comments    Pt refused PT at first. Agreed to ambulate around room. Pt did not want to be touched; made one lap around room with one LOB. VCs for posture while ambulating. Pt declined to perform exercises on EOB due to anticipated doctor visit and because pt reports performing exercises by herself. Pt emotional due to being in hospital and waiting for MD visit.   Follow Up Recommendations  Supervision/Assistance - 24 hour;Home health PT     Equipment Recommendations  None recommended by PT    Recommendations for Other Services       Precautions / Restrictions Precautions Precautions: None Restrictions Weight Bearing Restrictions: No    Mobility  Bed Mobility Overal bed mobility: Modified Independent Bed Mobility: Supine to Sit;Sit to Supine     Supine to sit: Modified independent (Device/Increase time);HOB elevated Sit to supine: Modified independent (Device/Increase time);HOB elevated      Transfers Overall transfer level: Needs assistance   Transfers: Sit to/from Stand Sit to Stand: Min guard         General transfer comment: Pt sat to stand 2 times from EOB; Min guard for safety as pt easily distractable and emotional.  Ambulation/Gait Ambulation/Gait assistance: Min guard Ambulation Distance (Feet): 25 Feet   Gait Pattern/deviations: Step-through pattern;Trunk flexed;Decreased stride length Gait velocity: decreased    General Gait Details: Pt refused to walk in hallway. Agreed to ambulate around room to show me she was able. One LOB where ran into SPTA.   Stairs            Wheelchair Mobility    Modified Rankin (Stroke Patients Only)       Balance Overall balance assessment: Needs assistance Sitting-balance support: No upper extremity supported;Feet supported Sitting balance-Leahy Scale: Good     Standing balance support: No upper extremity supported Standing balance-Leahy Scale: Poor                              Cognition Arousal/Alertness: Awake/alert Behavior During Therapy: WFL for tasks assessed/performed Overall Cognitive Status: Within Functional Limits for tasks assessed                                 General Comments: Pt concerned about visit from MD. Upset when asked if she was able to stand up any straighter as has scoleosis.       Exercises      General Comments        Pertinent Vitals/Pain Pain Assessment: No/denies pain Pain Intervention(s): Limited activity within patient's tolerance;Monitored during session;Repositioned    Home Living                      Prior Function            PT Goals (current goals can now be found in the care plan section) Acute Rehab PT Goals Patient  Stated Goal: home PT Goal Formulation: With patient Time For Goal Achievement: 11/03/17 Potential to Achieve Goals: Fair Progress towards PT goals: Progressing toward goals    Frequency    Min 3X/week      PT Plan Current plan remains appropriate    Co-evaluation              AM-PAC PT "6 Clicks" Daily Activity  Outcome Measure  Difficulty turning over in bed (including adjusting bedclothes, sheets and blankets)?: A Little Difficulty moving from lying on back to sitting on the side of the bed? : A Little Difficulty sitting down on and standing up from a chair with arms (e.g., wheelchair, bedside commode, etc,.)?: A  Little Help needed moving to and from a bed to chair (including a wheelchair)?: A Little Help needed walking in hospital room?: A Little Help needed climbing 3-5 steps with a railing? : A Lot 6 Click Score: 17    End of Session   Activity Tolerance: Patient limited by pain;Patient limited by fatigue Patient left: in bed;with call bell/phone within reach;with nursing/sitter in room Nurse Communication: Mobility status PT Visit Diagnosis: History of falling (Z91.81);Other abnormalities of gait and mobility (R26.89)     Time: 6073-7106 PT Time Calculation (min) (ACUTE ONLY): 9 min  Charges:  $Gait Training: 8-22 mins                    G Codes:       Janna Arch, SPTA   Janna Arch 10/31/2017, 12:55 PM

## 2017-10-31 NOTE — Consult Note (Addendum)
[facility] Face-to-Face Psychiatry Consult   Reason for Consult:  Suicide risk assessment Referring Physician:  Dr. Mingo Amber Patient Identification: Angelica Bell MRN:  [id number] Principal Diagnosis: Altered mental status Diagnosis:   Patient Active Problem List   Diagnosis Date Noted  . Peripheral neuropathy [G62.9]   . Drug overdose [T50.901A]   . Polypharmacy [Z79.899]   . Altered mental status [R41.82] 10/25/2017  . Closed displaced intertrochanteric fracture of left femur ([place]) [S72.142A] 02/16/2017  . Hip fracture, right ([place]) [S72.001A] 12/25/2015  . Chronic pain [G89.29] 12/24/2015  . Fracture of femoral neck, right, closed ([place]) [S72.001A] 12/24/2015  . Essential hypertension [I10] 12/24/2015  . Community acquired pneumonia [J18.9] 04/15/2013  . Hypokalemia [E87.6] 04/15/2013  . Labial cyst [N90.7] 02/08/2012  . POSTMENOPAUSAL SYNDROME [N95.9] 11/28/2009  . PERIPHERAL NEUROPATHY, LOWER EXTREMITY, RIGHT [G57.90] 02/22/2009  . INSOMNIA-SLEEP DISORDER-UNSPEC [G47.00] 10/12/2008  . Lumbago [M54.5] 08/02/2008  . 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 ANXIETY DEPRESSION [F34.1] 12/16/2007  . HYPERLIPIDEMIA [E78.5] 07/08/2007  . Osteoarthritis [M19.90] 07/08/2007  . Osteoporosis [M81.0] 07/08/2007  . SCOLIOSIS NEC [M41.80] 07/08/2007    Total Time spent with patient: 1.5 hours  Subjective:   Angelica Bell is a 83 y.o. female patient admitted with altered mental status on 10/27 after being found in her home unresponsive by her grandson. There was a concern for intentional drug overdose so psychiatry was consulted.  HPI:   Angelica Bell was previously seen by the psychiatry consult service on 10/29 and 10/31. It was recommended for geropsychiatric hospitalization due to concern for intentional drug overdose and no evidence to prove that her attempt was not intentional. Psychiatry was consulted again to re-evaluate suicide risk assessment given the primary team's  concern that patient did not seem to pose a danger to self.  Angelica Bell reports that she last remembers lying down to go to bed on 10/27. She took 2 pills of Oxycodone prior to falling asleep. She remembers waking up in the hospital the following Monday with no recollection of any of the events in between this time. She reports that she loves life and she must have taken the extra tablets of Xanax by accident. She would not end her life because she has 3 great grandsons that she loves to spend time with. She does not understand what happened to her and she potentially "blacked out." She denies a history of similar symptoms or problems with memory loss. She denies feeling depressed prior to this event or having thoughts to end her life. She denies illicit substance or alcohol use. She reports taking her medications as prescribed by her doctor. She denies feeling oversedated by her medications. Her medications are prescribed by her orthopedist, Dr. Nelva Bush. She denies problems with anxiety. She does admit worrying about her brother who lives in a nursing home since he had a stroke. He is in another state. She cannot speak to him by phone because he is nonverbal. She realizes there is nothing she can do about this situation but she is hopeful to visit him one day. She reports difficulty with initiating sleep. She denies problems with appetite. She enjoys reading, watching television and baking. She denies SI, HI or AVH. She denies access to weapons at home. 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   Collateral information obtained from grandson, Ben Case with patient's permission: Mr. Case reports no concerns for his grandmother's safety to discharge to home. He believes that there were several  pills on the floor when he found his grandmother that could potentially account for the missing Xanax pills from her near empty bottle. He reports that she has not appeared depressed or endorsed SI.  He reports that she has is forgetful at times. She left the water running and has mixed up her medications even when they were sorted in a pill dispenser. He is agreeable to administering the patient's medications and keeping them in his possession. Patient is also agreeable to this plan. She reports that her grandson is her POA. He has also been managing her fiances. Of note, nursing report that patient demonstrated short term memory impairments during her hospitalization.   Past Psychiatric History: Anxiety  Risk to Self: No. Denies SI and contracts for safety.  Risk to Others:  No Prior Inpatient Therapy:  No Prior Outpatient Therapy:  Yes. She is prescribed Xanax for anxiety by Dr. Nelva Bush.  Past Medical History:  Past Medical History:  Diagnosis Date  . Chronic back pain    with degenerative joint disease  . Chronic pain disorder    with narcotic management  . Dry eyes   . Hypertension   . Osteoporosis   . PE (pulmonary embolism)    After hysterectomy in 1967  . Pneumonia     Past Surgical History:  Procedure Laterality Date  . ABDOMINAL HYSTERECTOMY    . APPENDECTOMY    . BREAST ENHANCEMENT SURGERY    . FEMUR IM NAIL Left 02/17/2017   Procedure: INTRAMEDULLARY (IM) NAIL FEMORAL;  Surgeon: Nicholes Stairs, MD;  Location: [place];  Service: Orthopedics;  Laterality: Left;  . HIP ARTHROPLASTY Right 12/24/2015   Procedure: ARTHROPLASTY BIPOLAR HIP (HEMIARTHROPLASTY);  Surgeon: Netta Cedars, MD;  Location: [facility];  Service: Orthopedics;  Laterality: Right;  . TONSILLECTOMY     Family History:  Family History  Problem Relation Age of Onset  . Heart Problems Mother    Family Psychiatric  History: Denies  Social History:  History  Alcohol Use No     History  Drug Use No    Social History   Social History  . Marital status: Widowed    Spouse name: N/A  . Number of children: 3  . Years of education: HS   Occupational History  . Retired Retired   Social History  Main Topics  . Smoking status: Former Smoker    Years: 13.00    Quit date: 04/15/1976  . Smokeless tobacco: Never Used  . Alcohol use No  . Drug use: No  . Sexual activity: No   Other Topics Concern  . None   Social History Narrative   Patient lives at home with her grandson.   Caffeine Use: none   Additional Social History: She lives at home alone. Her son lives 1.5 miles away. She was married 3 times. Her late husband is deceased. She served in the [employer].     Allergies:   Allergies  Allergen Reactions  . Amoxicillin-Pot Clavulanate Hives and Diarrhea    Has patient had a PCN reaction causing immediate rash, facial/tongue/throat swelling, SOB or lightheadedness with hypotension: yes Has patient had a PCN reaction causing severe rash involving mucus membranes or skin necrosis: yes Has patient had a PCN reaction that required hospitalization : unknown Has patient had a PCN reaction occurring within the last 10 years: yes If all of the above answers are "NO", then may proceed with Cephalosporin use.   Marland Kitchen Lisinopril-Hydrochlorothiazide Other (See Comments)    Tired-  no energy to do anything..  . Sulfonamide Derivatives Hives    Labs:  Results for orders placed or performed during the hospital encounter of 10/25/17 (from the past 48 hour(s))  Basic metabolic panel     Status: Abnormal   Collection Time: 10/30/17  2:20 AM  Result Value Ref Range   Sodium 132 (L) 135 - 145 mmol/L    Comment: DELTA CHECK NOTED   Potassium 3.6 3.5 - 5.1 mmol/L   Chloride 99 (L) 101 - 111 mmol/L   CO2 24 22 - 32 mmol/L   Glucose, Bld 141 (H) 65 - 99 mg/dL   BUN 20 6 - 20 mg/dL   Creatinine, Ser 0.82 0.44 - 1.00 mg/dL   Calcium 9.3 8.9 - 10.3 mg/dL   GFR calc non Af Amer >60 >60 mL/min   GFR calc Af Amer >60 >60 mL/min    Comment: (NOTE) The eGFR has been calculated using the CKD EPI equation. This calculation has not been validated in all clinical situations. eGFR's persistently <60 mL/min  signify possible Chronic Kidney Disease.    Anion gap 9 5 - 15  Basic metabolic panel     Status: Abnormal   Collection Time: 10/31/17  3:17 AM  Result Value Ref Range   Sodium 138 135 - 145 mmol/L   Potassium 3.4 (L) 3.5 - 5.1 mmol/L   Chloride 103 101 - 111 mmol/L   CO2 26 22 - 32 mmol/L   Glucose, Bld 167 (H) 65 - 99 mg/dL   BUN 26 (H) 6 - 20 mg/dL   Creatinine, Ser 1.09 (H) 0.44 - 1.00 mg/dL   Calcium 9.7 8.9 - 10.3 mg/dL   GFR calc non Af Amer 46 (L) >60 mL/min   GFR calc Af Amer 53 (L) >60 mL/min    Comment: (NOTE) The eGFR has been calculated using the CKD EPI equation. This calculation has not been validated in all clinical situations. eGFR's persistently <60 mL/min signify possible Chronic Kidney Disease.    Anion gap 9 5 - 15    Current Facility-Administered Medications  Medication Dose Route Frequency Provider Last Rate Last Dose  . acetaminophen (TYLENOL) tablet 650 mg  650 mg Oral Q6H PRN Wendee Beavers T, MD   650 mg at 10/31/17 0049   Or  . acetaminophen (TYLENOL) suppository 650 mg  650 mg Rectal Q6H PRN Gonfa, Taye T, MD      . enoxaparin (LOVENOX) injection 40 mg  40 mg Subcutaneous Q24H Gonfa, Taye T, MD   40 mg at 96/29/52 8413  . folic acid (FOLVITE) tablet 1 mg  1 mg Oral Daily Alveda Reasons, MD   1 mg at 10/31/17 0935  . gabapentin (NEURONTIN) capsule 200 mg  200 mg Oral TID Eloise Levels, MD   200 mg at 10/31/17 1623  . Influenza vac split quadrivalent PF (FLUZONE HIGH-DOSE) injection 0.5 mL  0.5 mL Intramuscular Tomorrow-1000 Leeanne Rio, MD      . LORazepam (ATIVAN) tablet 1 mg  1 mg Oral Once Sherene Sires, DO       Or  . LORazepam (ATIVAN) injection 1 mg  1 mg Intramuscular Once Sherene Sires, DO      . LORazepam (ATIVAN) injection 2-3 mg  2-3 mg Intravenous Q1H PRN Wendee Beavers T, MD   2 mg at 10/29/17 0023  . polyethylene glycol (MIRALAX / GLYCOLAX) packet 17 g  17 g Oral Daily PRN Mercy Riding, MD      .  thiamine (VITAMIN B-1) tablet  100 mg  100 mg Oral Daily Alveda Reasons, MD   100 mg at 10/30/17 1610    Musculoskeletal: Strength & Muscle Tone: within normal limits Gait & Station: normal Patient leans: N/A  Psychiatric Specialty Exam: Physical Exam  Constitutional: She is oriented to person, place, and time. She appears well-developed and well-nourished.  HENT:  Head: Normocephalic and atraumatic.  Neck: Normal range of motion.  Respiratory: Effort normal.  Musculoskeletal: Normal range of motion.  Neurological: She is alert and oriented to person, place, and time.  Skin: No rash noted.  Psychiatric: She has a normal mood and affect. Her behavior is normal. Judgment and thought content normal.    Review of Systems  Psychiatric/Behavioral: Negative for depression, hallucinations, memory loss, substance abuse and suicidal ideas. The patient is not nervous/anxious and does not have insomnia.     Blood pressure 114/68, pulse 98, temperature (!) 97.5 F (36.4 C), temperature source Oral, resp. rate (!) 27, height '5\' 3"'  (1.6 m), weight 54 kg (119 lb), SpO2 95 %.Body mass index is 21.08 kg/m.  General Appearance: Well Groomed, tall, thin, Caucasian, elderly female with short gray hair and corrective lenses who is wearing a hospital gown. NAD.  Eye Contact:  Good  Speech:  Normal Rate  Volume:  Normal  Mood:  Euthymic  Affect:  Full Range  Thought Process:  Goal Directed and Linear  Orientation:  Full (Time, Place, and Person)  Thought Content:  Logical  Suicidal Thoughts:  No  Homicidal Thoughts:  No  Memory:  Immediate;   Fair Recent;   Fair Remote;   Fair  Judgement:  Good  Insight:  Good  Psychomotor Activity:  Normal  Concentration:  Concentration: Good and Attention Span: Good  Recall:  [place] of Knowledge:  Good  Language:  Good  Akathisia:  No  Handed:  Right  AIMS (if indicated):   N/A  Assets:  Agricultural consultant Housing Social Support  ADL's:   Intact  Cognition:  WNL  Sleep:   Okay    Assessment: Ms. Rylen Hou was admitted with altered mental status on 10/27 after being found in her home unresponsive by her grandson. There was a concern for intentional drug overdose with Xanax so psychiatry was consulted. She was seen by the psychiatry consult service on 10/29 and 10/31. It was recommended for geropsychiatric hospitalization due to concern for intentional drug overdose and no evidence to prove that her attempt was not intentional. Psychiatry was consulted again to re-evaluate suicide risk assessment given the primary team's concern that patient did not seem to pose a danger to self. Today, patient continues to deny a suicide attempt. She is future oriented and reports many reasons to live including her family. She is bright on exam and denies SI prior to the events that led to hospitalization as well as current SI. There are no guns in the home. There is concern for memory problems that cause confusion with taking her medications appropriately. She will benefit from assistance with her medications and her son is agreeable to keeping her medications and administering them daily. She may benefit from neuropsychological testing and may additionally require assistance at home.   Treatment Plan Summary: -Patient is psychiatrically stable for discharge. Recommend releasing patient from IVC.  -Patient's son agrees to administer patient's medications to prevent accidental overuse or confusion. There are no guns present in her home.  -Recommend neuropsychological testing to rule out neurocognitive  disorder given short term memory deficits/forgetfulness. -Recommend that her doctor discontinue Xanax since it can lead to confusion, dementia and falls in the elderly and can be lethal in overdose.  -Patient may benefit from home health services.  -Will sign off on patient at this time. Please consult psychiatry again as needed.  -Recommend Melatonin  to regulate sleep/wake cycle.   Disposition: No evidence of imminent risk to self or others at present.   Patient does not meet criteria for psychiatric inpatient admission. Supportive therapy provided about ongoing stressors.  Faythe Dingwall, DO 10/31/2017 4:59 PM

## 2017-10-31 NOTE — Progress Notes (Signed)
Family Medicine Teaching Service Daily Progress Note Intern Pager: [phone]  Patient name: Angelica Bell Medical record number: [id number] Date of birth: January 09, 1932 Age: 83 y.o. Gender: female  Primary Care Provider: Heywood Bene, PA-C Consultants:  Code Status: full  Pt Overview and Major Events to Date:  Angelica Bell is a 83 y.o. female presenting with altered mental status . PMH is significant for hypertension, remote history of PE not on anticoagulation, anxiety, depression, osteoporosis with right femoral neck fracture, peripheral neuropathy, osteoarthritis & chronic pain. Patient placed on suicide precautions after psychiatry evaluation.  Assessment and Plan: Angelica Bell is a 83 y.o. female presenting with altered mental status . PMH is significant for hypertension, remote history of PE not on anticoagulation, anxiety, depression, osteoporosis with right femoral neck fracture, peripheral neuropathy, osteoarthritis & chronic pain. Possible xanax OD per story by son.  Altered mental status: Resolved to baseline per grandson.  Pysch determined capacity and placed on suicide precautions.  -on med/surg w/ suicide precautions -Hold sedating medications -Seizure precautions -CIWA/COWS protocol (0 overnight) -Oxygen as needed  Suicide Attempt- *currently denies SI* Patient determined to have capacity and OD was determined to be intentional by psychiatry -suicide precautions -second psych opinion pending given patient's different presentation to psych/FM -IVC/inpatient psych to be discussed further between [facility] service and psych  Anxiety/depression: Since she was on Lexapro, Trazodone and Xanax at home. Story from family includes near empty bottle of xanex that had just been refilled.  Cconcern for lexapro ontributing to potential intentional suicide. -NO LEXAPRO -will defer to her long term provider once disposition is determined  Osteoarthritis/chronic  pain/Neuropathic pain in feet/LE- : she was on oxycodone and fentanyl patch at home, given tramadol 50x2 overnight -Hold oxy/fentanyl - gabapentin 300 TID  Osteoporosis with history of femoral neck fracture:  -Outpatient f/u  Hypertension: resolved 120s/70s -d/c HCTZ in light of new hyponatremia for concern of TIH  Hyponatremia- resolved.  Concern for thiazide contribution -D/C HCTZ  ? Pneumonia: 5 day course of abx completed, no respiratory symptoms  FEN/GI: -regular diet -DC fluids as patient is eating regular diet  Prophylaxis: -Lovenox  Disposition:  medically cleared to d/c but still question over IVC/vs home.  Dr. Mariea Clonts to offer additional opinion given patient's variable presentation to 2 current attendings  Subjective:  Patient was pleasant this morning.  Still denies SI and still expresses that she wants to get out of hospital.  Is willing to talk to Dr. Mariea Clonts.  Gives consent to call her grandson for pickup if she is d/c'd.  Does not want [place] to call him about IVC situation.  She says the gabapentin works well for her nerves, wants to know if she can have her home roxy 30TID back.  We discussed concern for her safety at discharge if she were to intentionally or accidentally take too much medicine and she said she understood.  Objective: Temp:  [97.3 F (36.3 C)-98 F (36.7 C)] 97.4 F (36.3 C) (11/02 0352) Pulse Rate:  [83-95] 87 (11/02 0352) Resp:  [18-29] 21 (11/02 0352) BP: (119-137)/(67-87) 128/70 (11/02 0352) SpO2:  [94 %-98 %] 94 % (11/02 0352) Weight:  [119 lb (54 kg)] 119 lb (54 kg) (11/02 0352) Physical Exam: GEN: sleeping comfortably Head: normocephalic and atraumatic  Nares: no rhinorrhea, congestion or erythema  Oropharynx: No dentition CVS: RRR, nl s1 & s2, no murmurs, no edema RESP: no IWOB, good air movement bilaterally, CTAB anteriorly GI: BS present & normal, MSK: no edema  SKIN: no apparent skin lesion NEURO: AOx3 with significant memory  deficits.  CN grossly intact.    Laboratory:  Recent Labs Lab 10/26/17 0219 10/27/17 0338 10/28/17 0401  WBC 8.6 6.1 7.9  HGB 11.3* 10.9* 10.9*  HCT 35.0* 33.9* 33.4*  PLT 247 269 281    Recent Labs Lab 10/25/17 1144 10/26/17 0219  10/28/17 0401 10/30/17 0220 10/31/17 0317  NA 139 140  < > 141 132* 138  K 3.6 3.5  < > 3.6 3.6 3.4*  CL 105 105  < > 109 99* 103  CO2 28 24  < > 24 24 26   BUN 13 8  < > 6 20 26*  CREATININE 0.94 0.75  < > 0.77 0.82 1.09*  CALCIUM 8.7* 9.0  < > 9.4 9.3 9.7  PROT 5.3* 5.2*  --   --   --   --   BILITOT 0.3 0.6  --   --   --   --   ALKPHOS 62 66  --   --   --   --   ALT 12* 11*  --   --   --   --   AST 24 23  --   --   --   --   GLUCOSE 108* 83  < > 105* 141* 167*  < > = values in this interval not displayed.   Imaging/Diagnostic Tests: Ct Head Wo Contrast  Result Date: 10/25/2017 CLINICAL DATA:  Altered mental status.  History of hypertension. EXAM: CT HEAD WITHOUT CONTRAST TECHNIQUE: Contiguous axial images were obtained from the base of the skull through the vertex without intravenous contrast. COMPARISON:  CT HEAD July 29, 2017 FINDINGS: Mildly motion degraded examination. BRAIN: No intraparenchymal hemorrhage, mass effect nor midline shift. The ventricles and sulci are normal for age. Patchy supratentorial white matter hypodensities within normal range for patient's age, though non-specific are most compatible with chronic small vessel ischemic disease. Old RIGHT cerebellar infarct. No acute large vascular territory infarcts. No abnormal extra-axial fluid collections. Basal cisterns are patent. VASCULAR: Mild to moderate calcific atherosclerosis of the carotid siphons. SKULL: No skull fracture. Severe temporomandibular osteoarthrosis. No significant scalp soft tissue swelling. SINUSES/ORBITS: Small LEFT sphenoid sinus air-fluid level. RIGHT jugular bulb dehiscence. The included ocular globes and orbital contents are non-suspicious. Status post  bilateral ocular lens implants. OTHER: None. IMPRESSION: 1. No acute intracranial process on this motion degraded examination. 2. Stable examination including old RIGHT cerebellar infarct and mild to moderate chronic small vessel ischemic disease. Electronically Signed   By: Elon Alas M.D.   On: 10/25/2017 23:20   Dg Chest Portable 1 View  Result Date: 10/25/2017 CLINICAL DATA:  Altered mental status. EXAM: PORTABLE CHEST 1 VIEW COMPARISON:  07/29/2017 FINDINGS: The cardiomediastinal silhouette is unchanged. Aortic eft cirrhosis is noted. The lungs remain hypoinflated with increased patchy opacity in the left lung base. No sizable pleural effusion or pneumothorax is identified. There is an old fracture of the proximal left humerus. Moderate S-shaped thoracolumbar scoliosis is noted. IMPRESSION: Low lung volumes with increasing left basilar opacity which may reflect atelectasis, pneumonia, or aspiration. Electronically Signed   By: Logan Bores M.D.   On: 10/25/2017 12:18     Sherene Sires, DO 10/31/2017, 6:06 AM PGY-1, [place] Intern pager: [phone], text pages welcome

## 2017-10-31 NOTE — Progress Notes (Signed)
Occupational Therapy Treatment and  Patient Details Name: Angelica Bell MRN: [id number] DOB: 07/25/1932 Today's Date: 10/31/2017    History of present illness 83 y.o. female presenting with altered mental status . Altered mental status likely due to overdose with benzo and opiates based on history and UDS.  PMH is significant for hypertension, remote history of PE not on anticoagulation, anxiety, depression, osteoporosis with right femoral neck fracture (2016), fall with L hip fx (01/2017), peripheral neuropathy, osteoarthritis & chronic pain.    OT comments  This 83 yo female admitted with above is overall at S level for basic ADLs when speaking with sitter (who has been with pt the last 2 days). She moves really well around in bed for doffing and donning her socks and scooting up in bed. PT today had her at a min guard A level when up on her feet. Feel the more she will get up and about the stronger she will get. She was not willing to get OOB with me. 24 hour S is recommended due to suicide precautions and HHOT to make sure she is managing in her own environment.   Follow Up Recommendations  Home health OT;Supervision/Assistance - 24 hour;Other (comment) (if not 24 hour S then SNF)    Equipment Recommendations  3 in 1 bedside commode       Precautions / Restrictions Precautions Precautions: None Restrictions Weight Bearing Restrictions: No       Mobility Bed Mobility Overal bed mobility: Modified Independent Scooting up in bed        ADL either performed or assessed with clinical judgement   ADL                                         General ADL Comments: Per speaking with sitter in room and pt; Angelica Bell is able to get up and go to bathroom without A and able to do her bathing and dressing without A at a sponge bath level. Sitter did not let her try socks so I had her doff and donn her socks at bed level (she did not want to get up) and she did this  without problems     Vision Patient Visual Report: No change from baseline            Cognition Arousal/Alertness: Awake/alert Behavior During Therapy: WFL for tasks assessed/performed Overall Cognitive Status: Within Functional Limits for tasks assessed                                 General Comments: Pt concerned about visit from MD. Upset when asked if she was able to stand up any straighter as has scoleosis.                    Pertinent Vitals/ Pain       Pain Assessment: No/denies pain Pain Intervention(s): Limited activity within patient's tolerance;Monitored during session;Repositioned            Progress Toward Goals  OT Goals(current goals can now be found in the care plan section)  Progress towards OT goals:  (see OT comments at top of note)  Acute Rehab OT Goals Patient Stated Goal: home  Plan Discharge plan remains appropriate       AM-PAC PT "6 Clicks" Daily Activity  Outcome Measure   Help from another person eating meals?: None Help from another person taking care of personal grooming?: A Little (pt requring S for all basic ADLs due to suicide precautions) Help from another person toileting, which includes using toliet, bedpan, or urinal?: A Little Help from another person bathing (including washing, rinsing, drying)?: A Little Help from another person to put on and taking off regular upper body clothing?: A Little Help from another person to put on and taking off regular lower body clothing?: A Little 6 Click Score: 19    End of Session    OT Visit Diagnosis: Muscle weakness (generalized) (M62.81)   Activity Tolerance Patient tolerated treatment well   Patient Left in bed;with call bell/phone within reach (sitter)           Time: 0347-4259 OT Time Calculation (min): 11 min  Charges: OT General Charges $OT Visit: 1 Visit OT Treatments $Self Care/Home Management : 8-22 mins  [place],  OTR/L [phone] 10/31/2017

## 2018-03-28 IMAGING — CR DG HIP (WITH OR WITHOUT PELVIS) 2-3V*L*
4 series · 4 of 4 positions shown · non-contrast
Comparison: 02/16/2017

CLINICAL DATA: Patient fell this morning.  Right hip and knee pain.

EXAM:
DG HIP (WITH OR WITHOUT PELVIS) 2-3V LEFT

[w hip lat left]
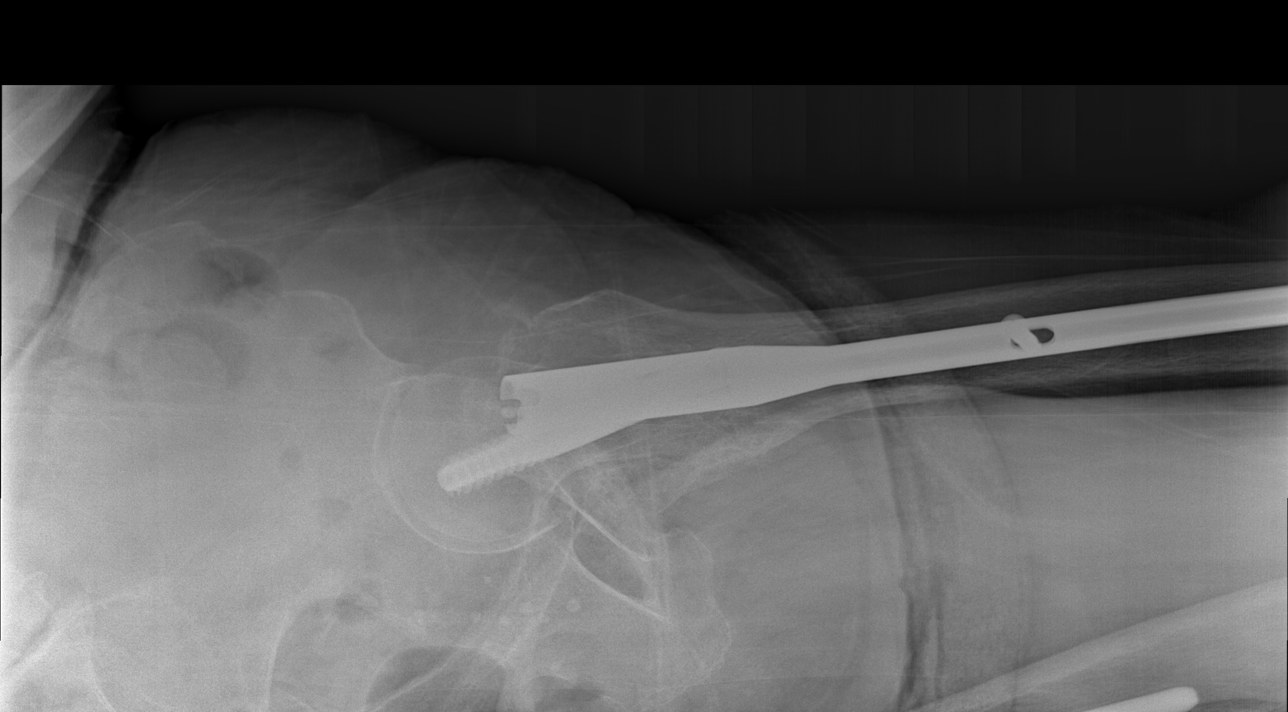

[x hip ap left (1 of 3)]
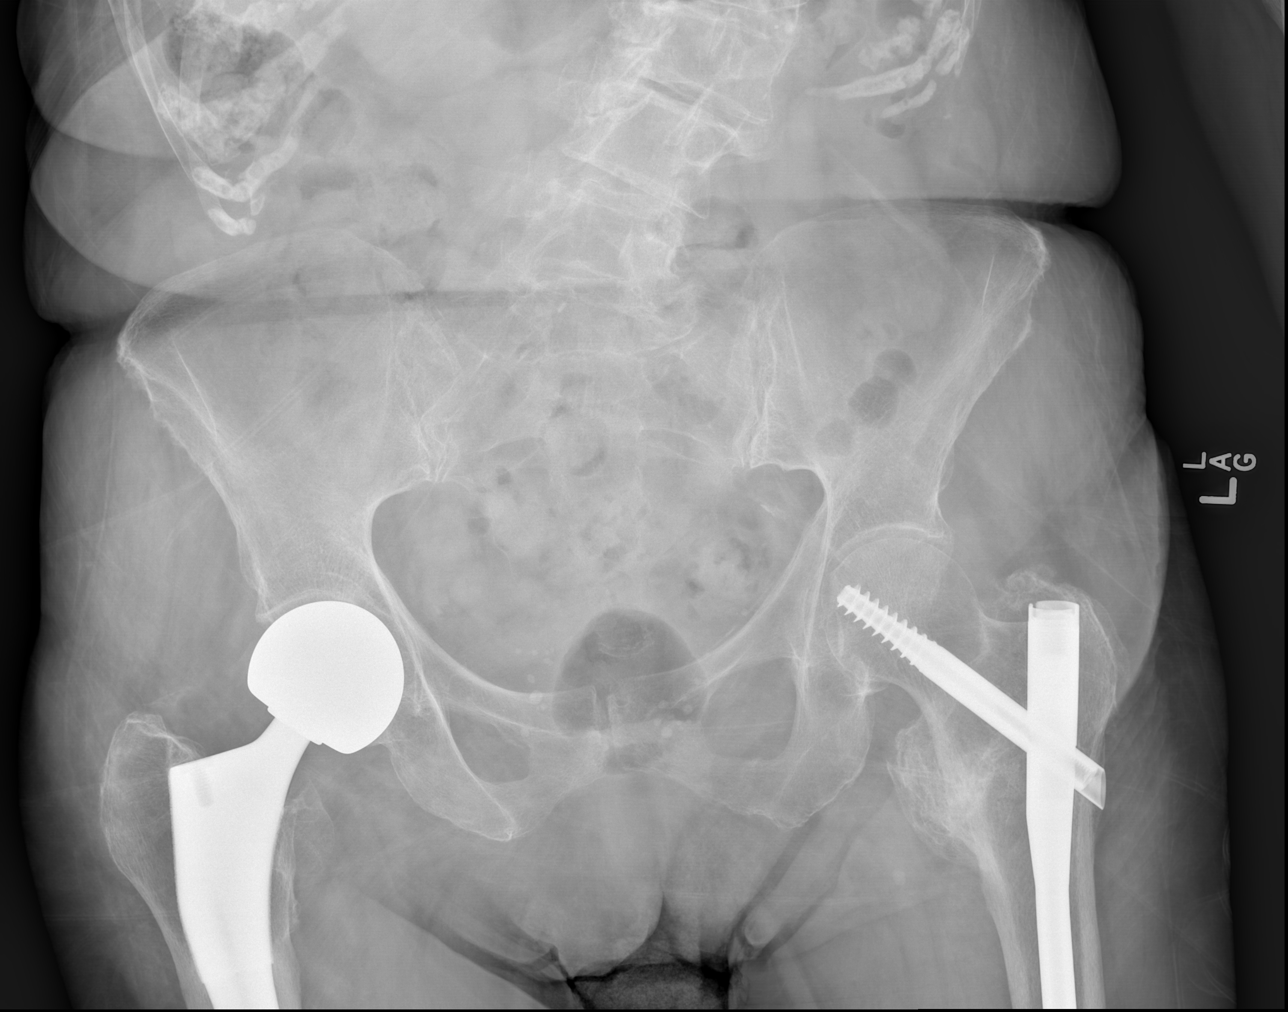

[x hip ap left (2 of 3)]
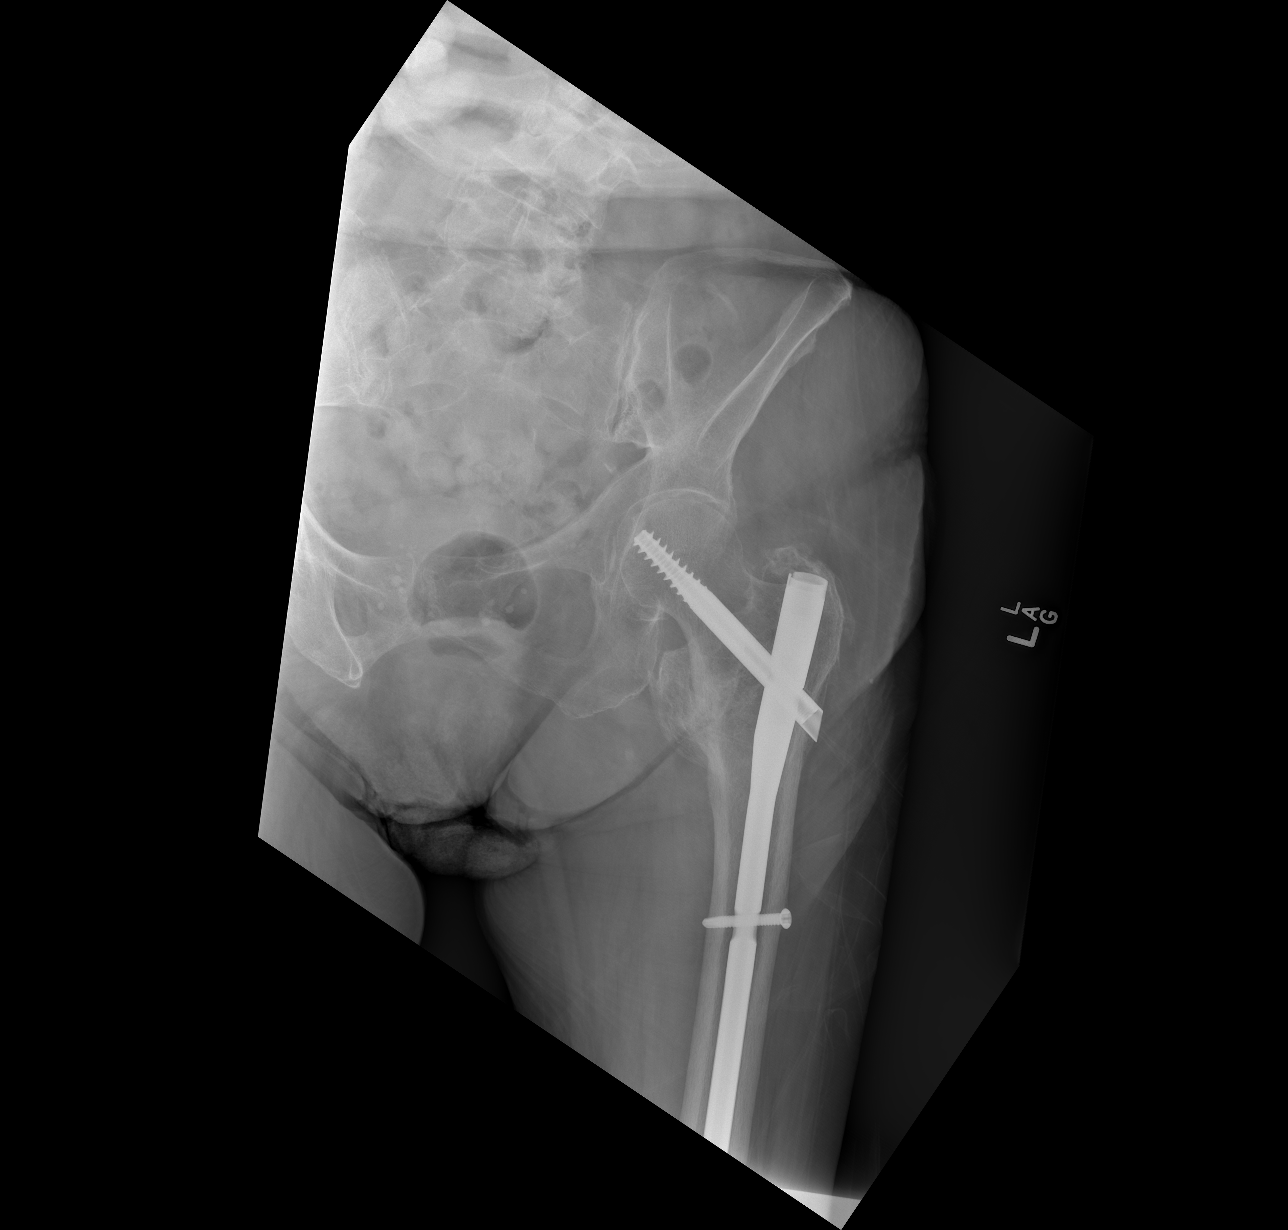

[x hip ap left (3 of 3)]
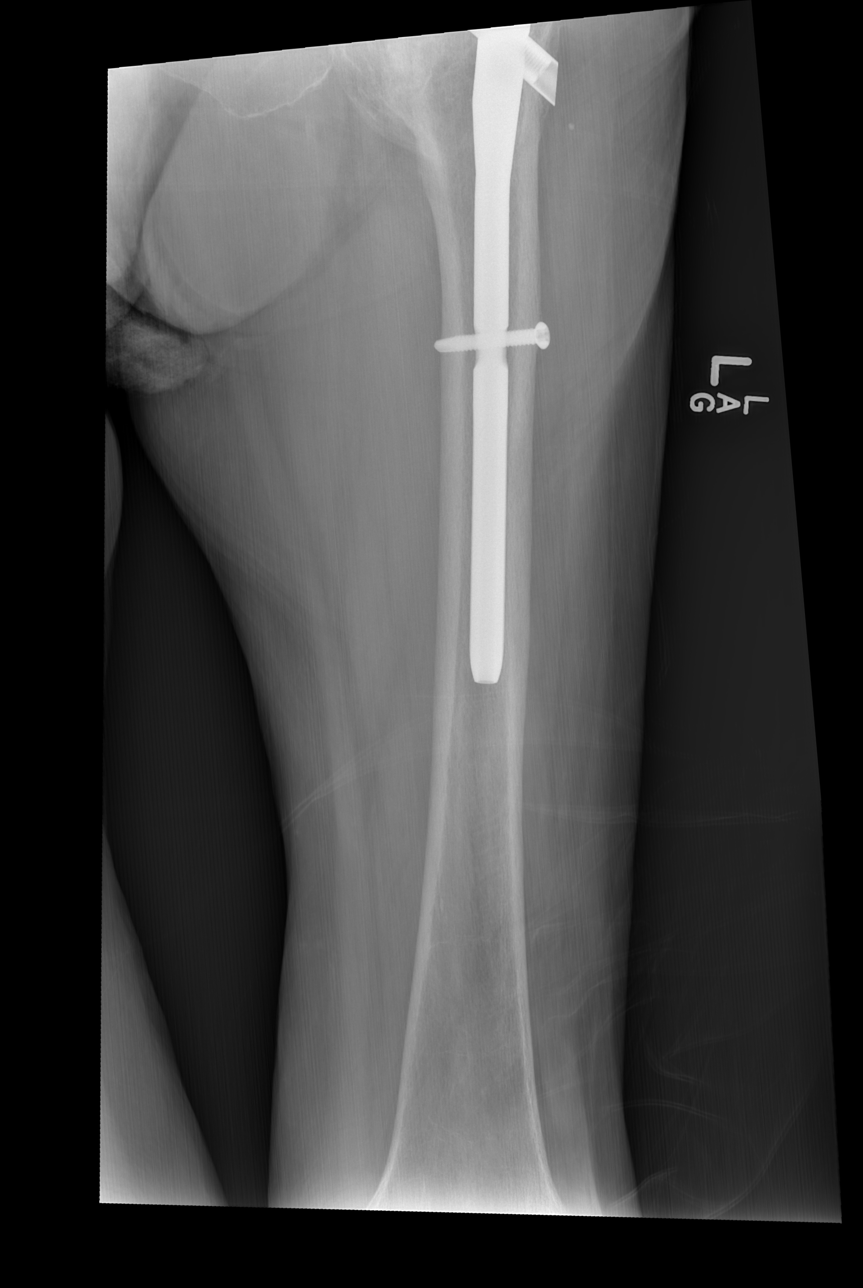

[4 of 4 positions shown; findings below may reference images not displayed]

FINDINGS: Right hip hemiarthroplasty incompletely visualized. Pelvis appears
intact. SI joints and symphysis pubis are not displaced. Previous
internal fixation of an old inter trochanteric fracture with
intramedullary rod and compression screw fixation. Hardware
components appear intact. Sclerosis in the fracture line consistent
with healing. No evidence of acute fracture or dislocation. No focal
bone lesion or bone destruction. Calcified phleboliths in the
pelvis. Soft tissues are unremarkable. Degenerative changes and
scoliosis of the lumbar spine.
IMPRESSION: Previous internal fixation of an old fracture of the inter
trochanteric left hip. Right hip hemiarthroplasty. No acute fracture
or dislocation.

## 2018-03-28 IMAGING — CR DG CHEST 1V
1 series · 1 of 1 positions shown · non-contrast
Comparison: 02/16/2017 chest radiograph

CLINICAL DATA: 85 y/o  F; status post fall with pain.

EXAM:
CHEST 1 VIEW

[x chest ap]
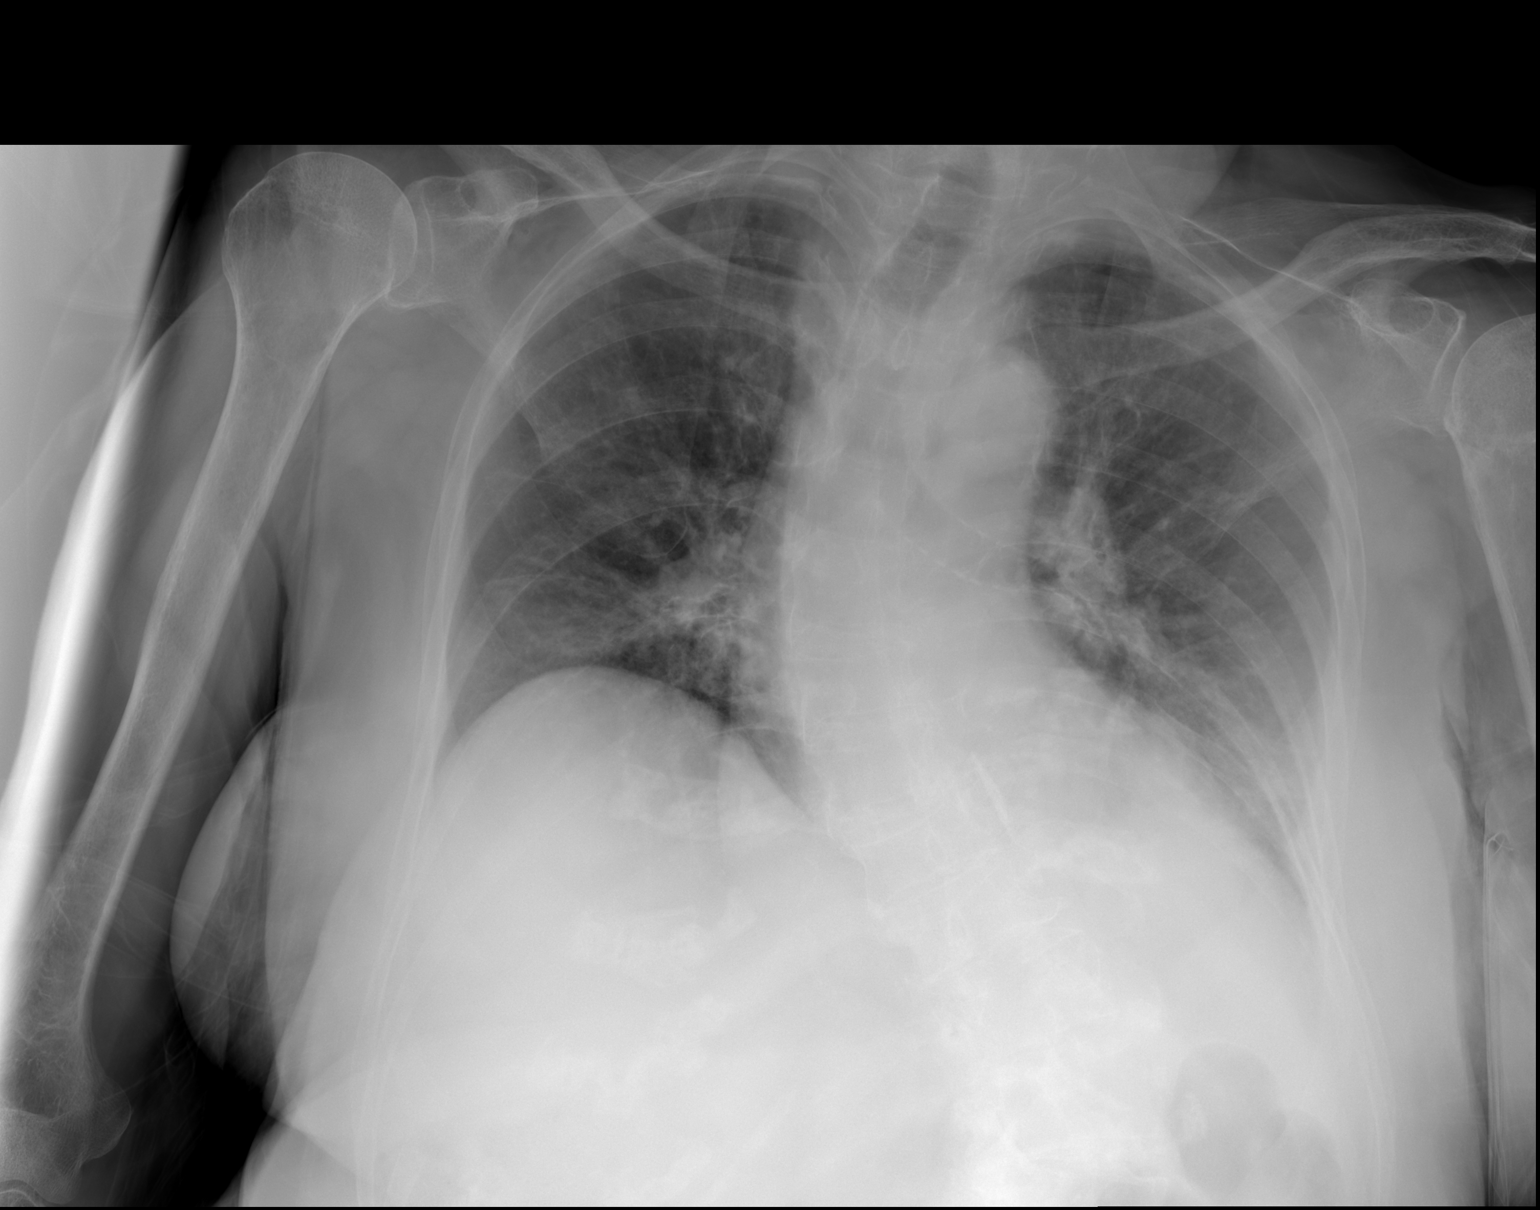

[1 of 1 positions shown; findings below may reference images not displayed]

FINDINGS: Stable cardiac silhouette given projection and technique. Aortic
atherosclerosis with calcification. Mild haziness at left lung base
probably represents minor atelectasis. No pleural effusion or
pneumothorax. Moderate S-shaped curvature of the spine. No acute
fracture identified.
IMPRESSION: Mild haziness at left lung base probably represents atelectasis.
Aortic atherosclerosis.

By: Adenyo Ologo M.D.

## 2018-03-28 IMAGING — CR DG KNEE COMPLETE 4+V*R*
5 series · 5 of 5 positions shown · non-contrast
Comparison: None.

CLINICAL DATA: Right patellar knee pain after a fall.

EXAM:
RIGHT KNEE - COMPLETE 4+ VIEW

[x knee obl right (1 of 2)]
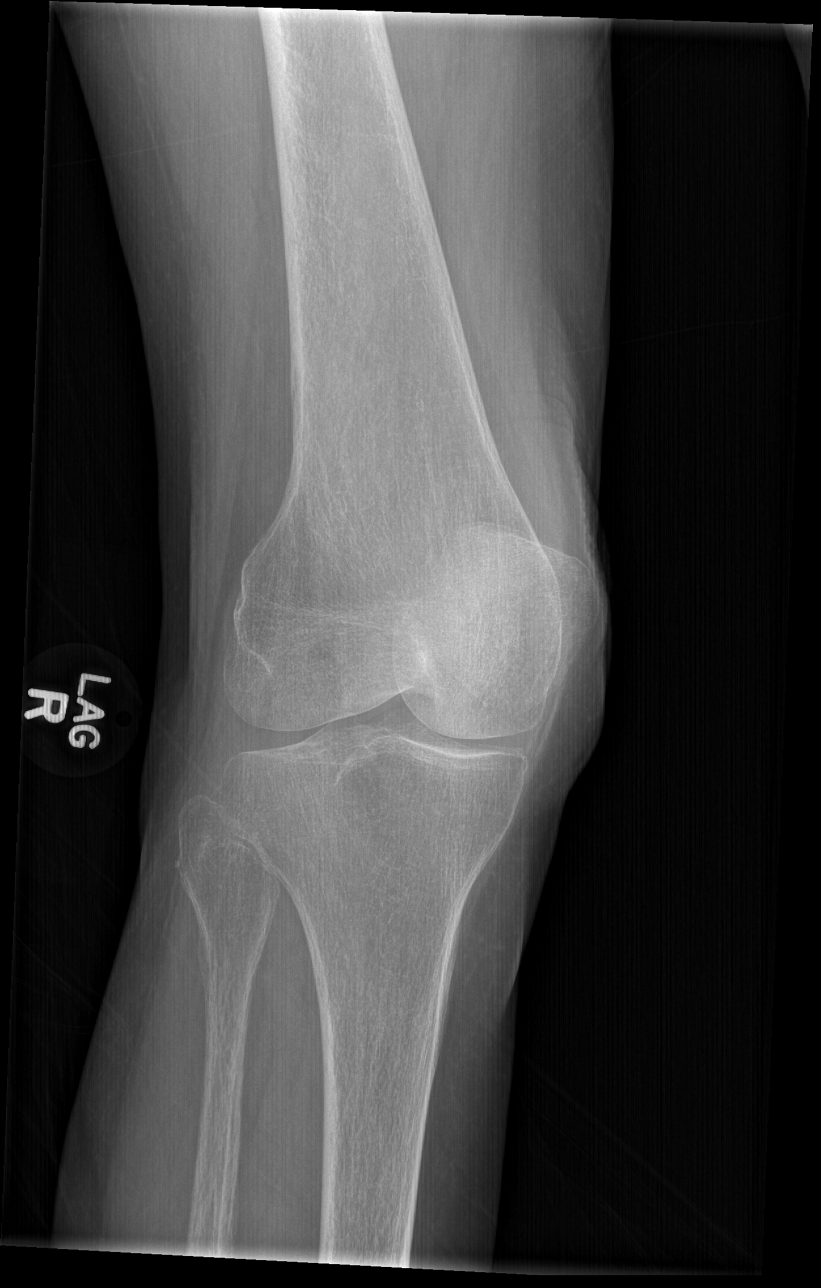

[x knee ap right]
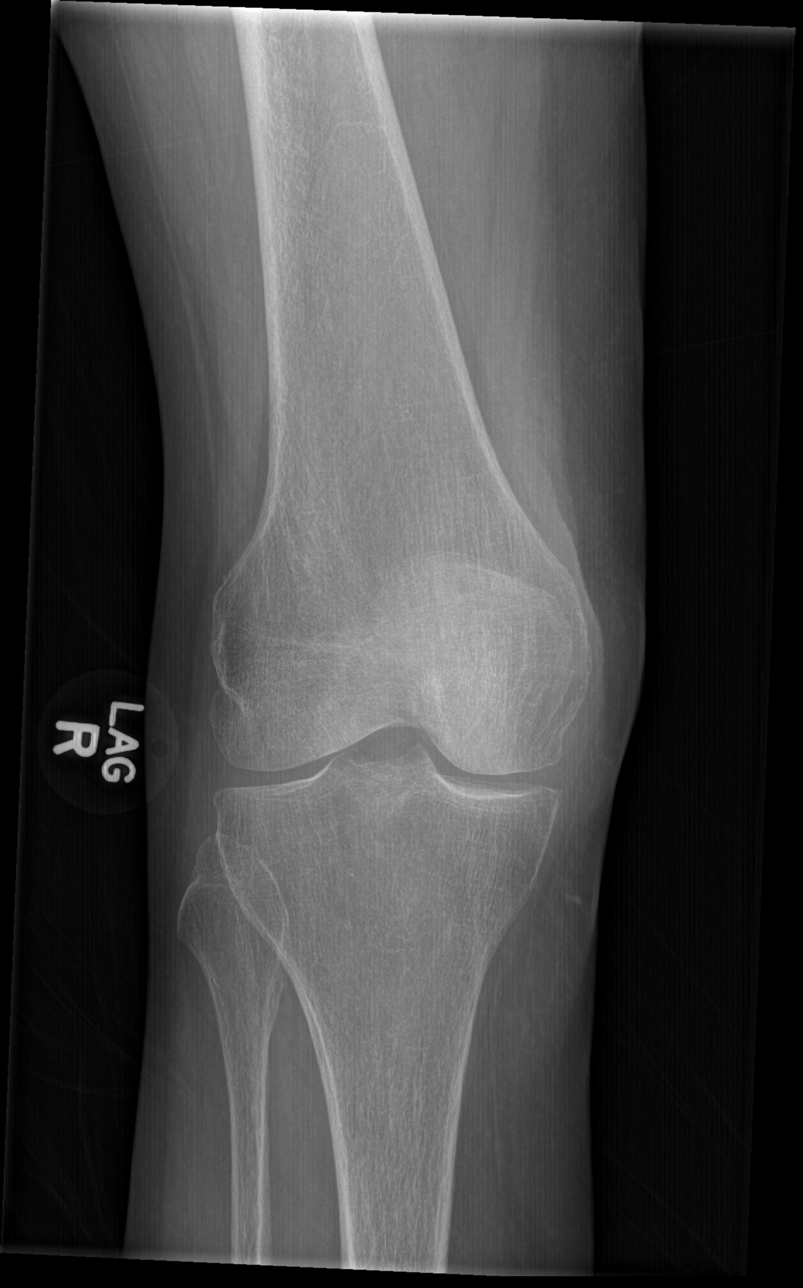

[x knee obl right (2 of 2)]
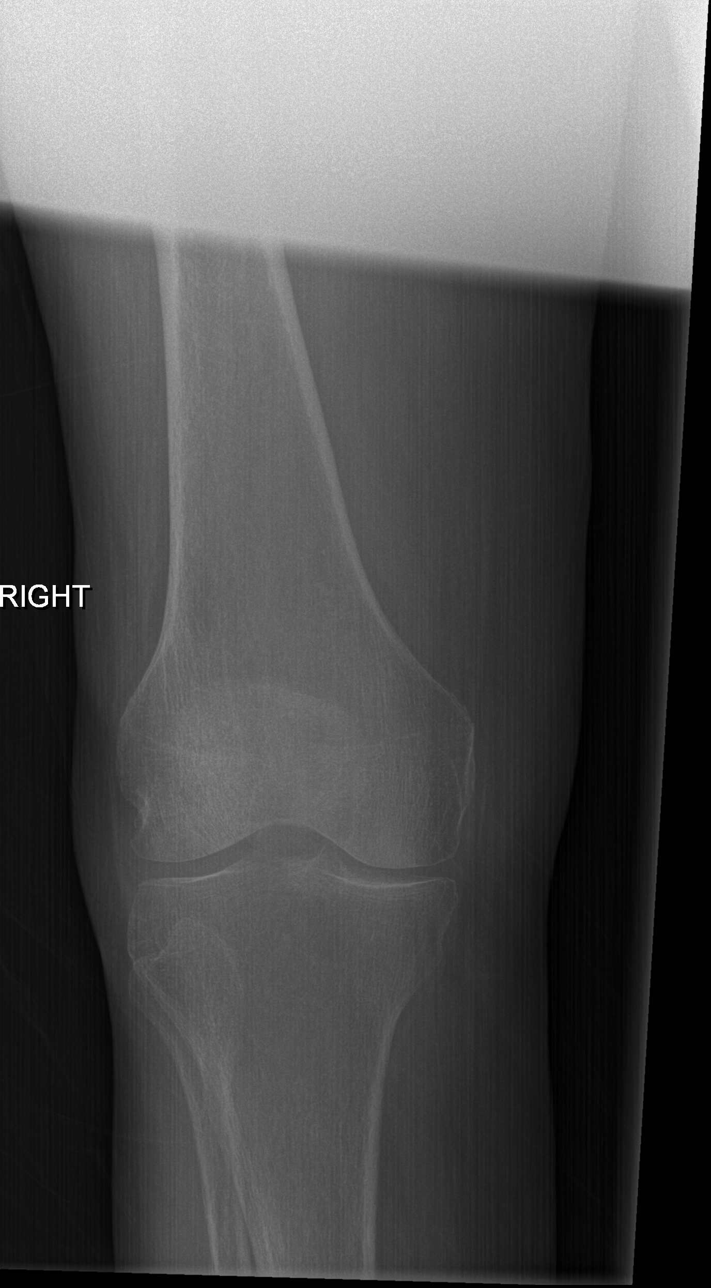

[x knee lat right (1 of 2)]
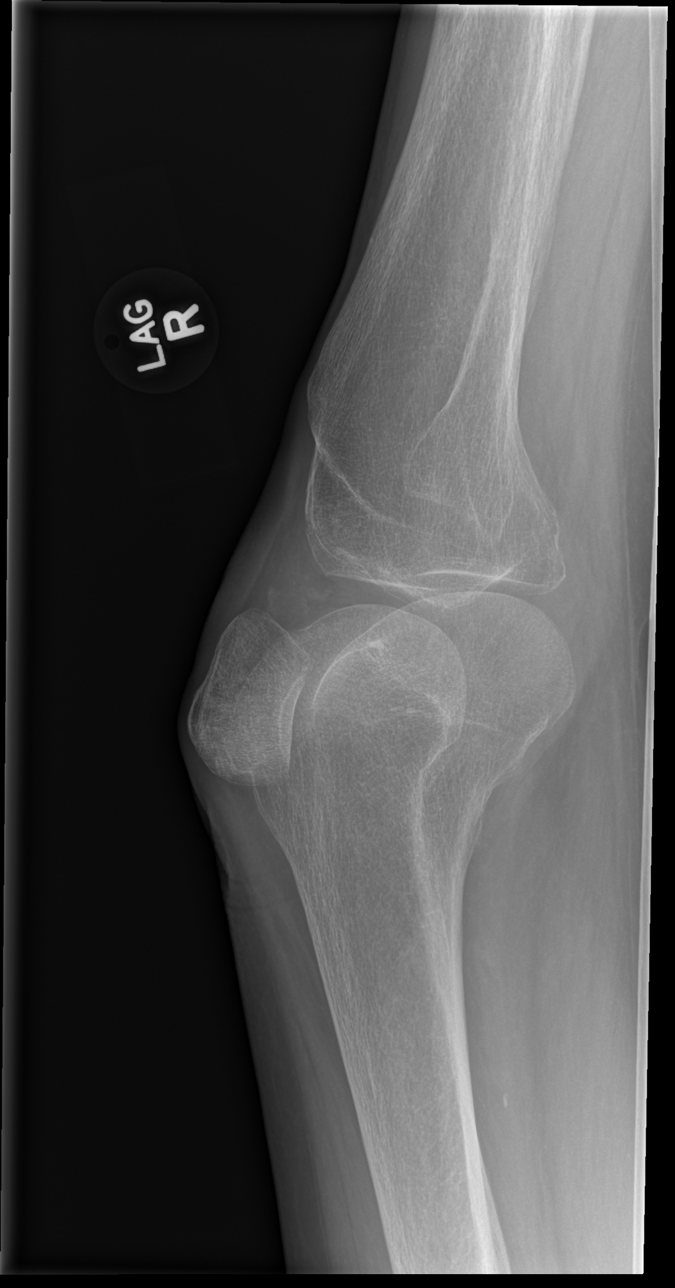

[x knee lat right (2 of 2)]
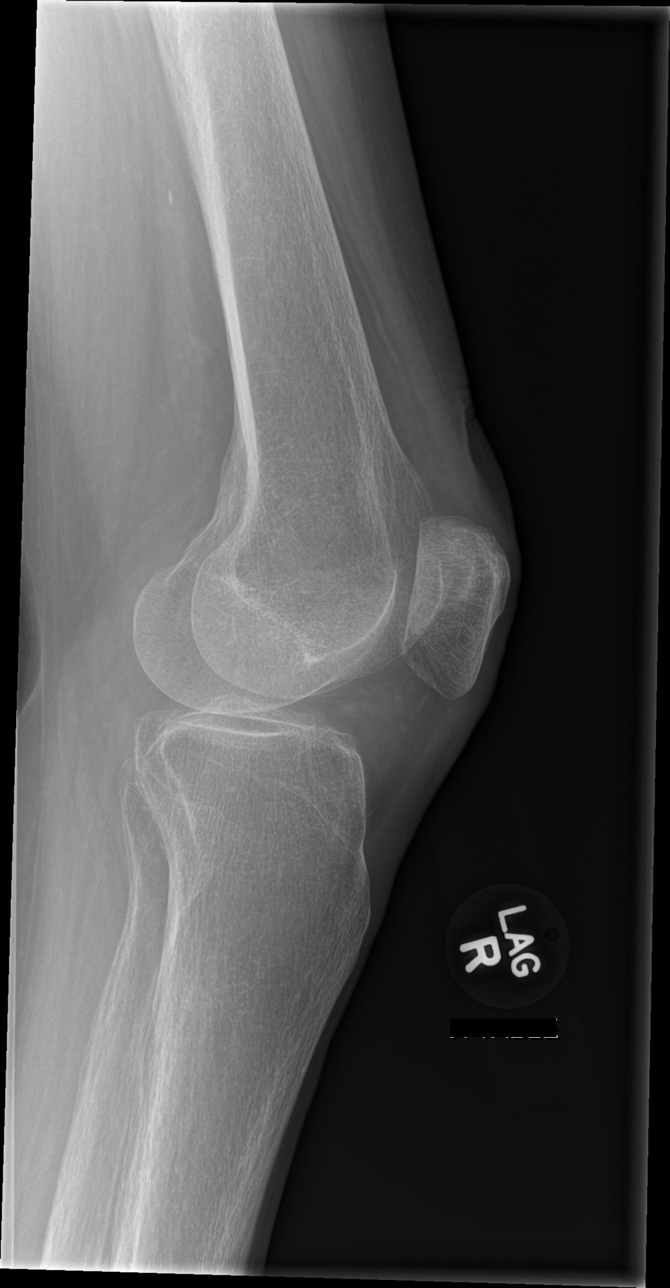

[5 of 5 positions shown; findings below may reference images not displayed]

FINDINGS: Diffuse bone demineralization. Degenerative changes in the right
knee with mild medial compartment narrowing and small osteophyte
formation. No evidence of acute fracture or dislocation. No focal
bone lesion or bone destruction. No significant effusion. Soft
tissues are unremarkable.
IMPRESSION: Mild degenerative changes in the right knee. No acute displaced
fractures identified.

## 2018-04-22 ENCOUNTER — Other Ambulatory Visit: Payer: Self-pay | Admitting: Physician Assistant

## 2018-04-22 ENCOUNTER — Ambulatory Visit
Admission: RE | Admit: 2018-04-22 | Discharge: 2018-04-22 | Disposition: A | Discharge status: Home / Self Care | Payer: Medicare Other | Source: Ambulatory Visit | Attending: Physician Assistant | Admitting: Physician Assistant

## 2018-04-22 DIAGNOSIS — R059 Cough, unspecified: Secondary | ICD-10-CM

## 2018-04-22 DIAGNOSIS — R05 Cough: Secondary | ICD-10-CM

## 2018-06-24 IMAGING — DX DG CHEST 1V PORT
1 series · 1 of 1 positions shown · non-contrast
Comparison: 07/29/2017

CLINICAL DATA: Altered mental status.

EXAM:
PORTABLE CHEST 1 VIEW

[chest ap]
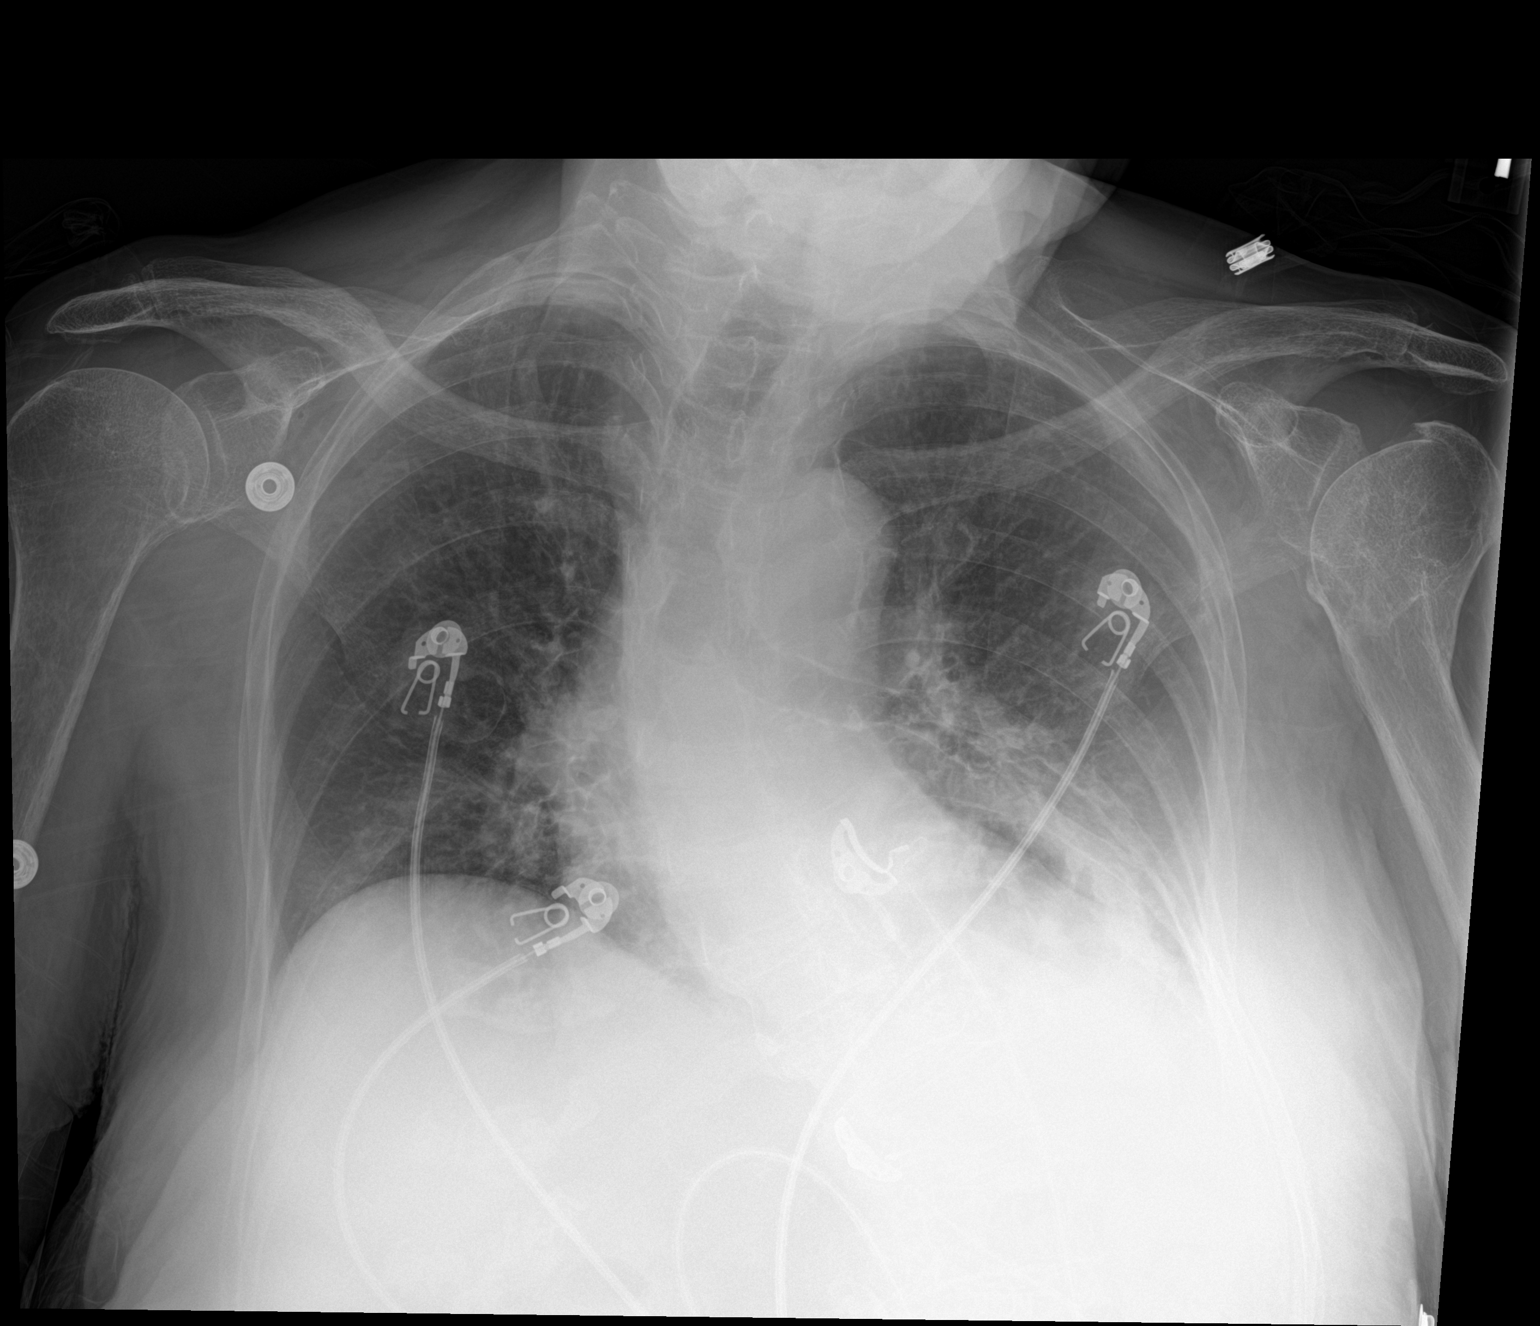

[1 of 1 positions shown; findings below may reference images not displayed]

FINDINGS: The cardiomediastinal silhouette is unchanged. Aortic eft cirrhosis
is noted. The lungs remain hypoinflated with increased patchy
opacity in the left lung base. No sizable pleural effusion or
pneumothorax is identified. There is an old fracture of the proximal
left humerus. Moderate S-shaped thoracolumbar scoliosis is noted.
IMPRESSION: Low lung volumes with increasing left basilar opacity which may
reflect atelectasis, pneumonia, or aspiration.

## 2018-06-24 IMAGING — CT CT HEAD W/O CM
5 of 6 series · 16 of 47 positions shown, 17 images · non-contrast
Comparison: CT HEAD July 29, 2017

CLINICAL DATA: Altered mental status.  History of hypertension.

EXAM:
CT HEAD WITHOUT CONTRAST
TECHNIQUE: Contiguous axial images were obtained from the base of the skull
through the vertex without intravenous contrast.

[Series 3: head without · axial · non-contrast · 0.41mm/px · z∈[-124,-44]mm · 3 of 32 slices shown, 4 images (1 of 2)]
[im 8/32  brain]
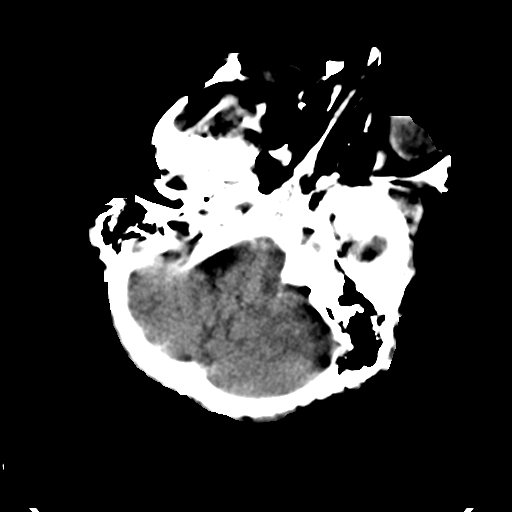
[im 8/32  bone]
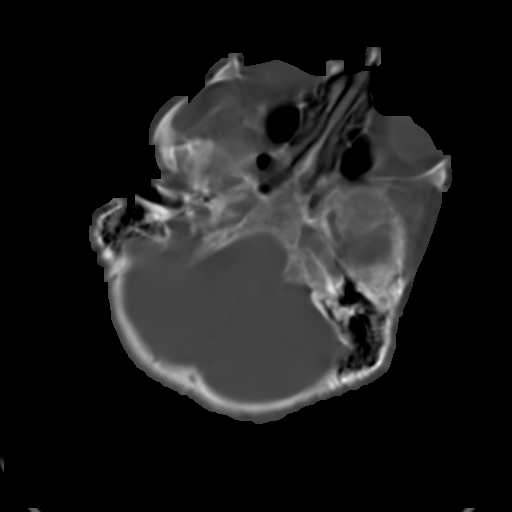
[im 16/32  brain]
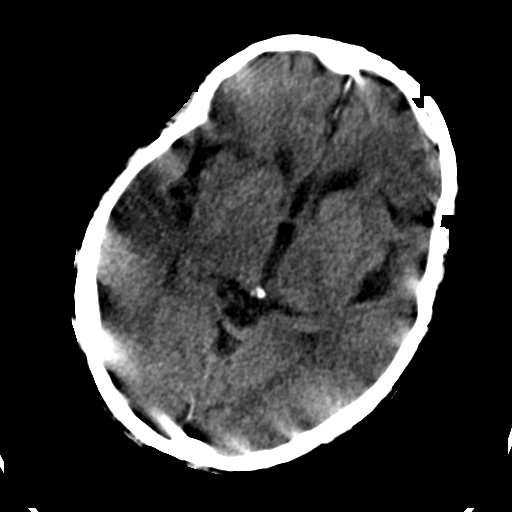
[im 24/32  brain]
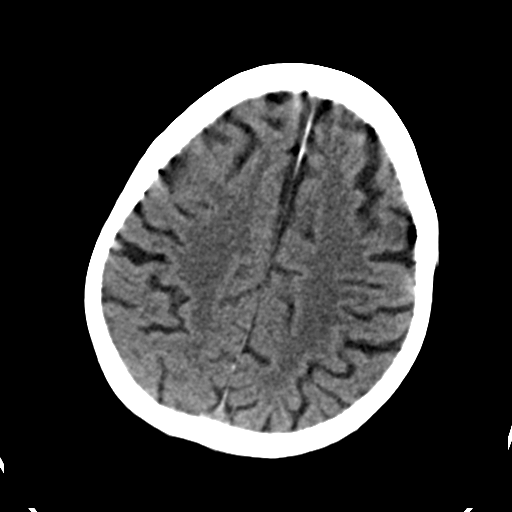

[Series 4: head without · axial · non-contrast · 0.41mm/px · z∈[-124,-89]mm · 2 of 23 slices shown (2 of 2)]
[im 8/23  brain]
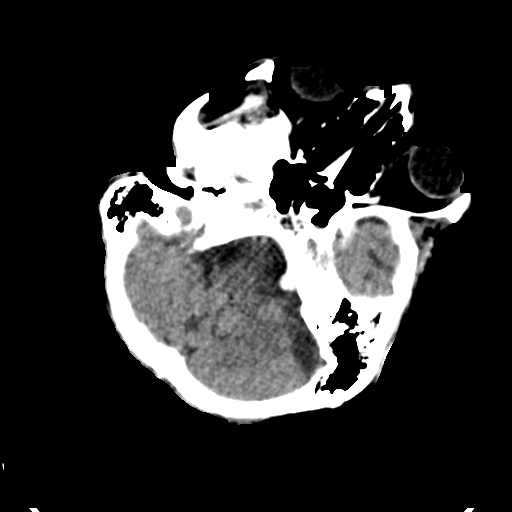
[im 15/23  brain]
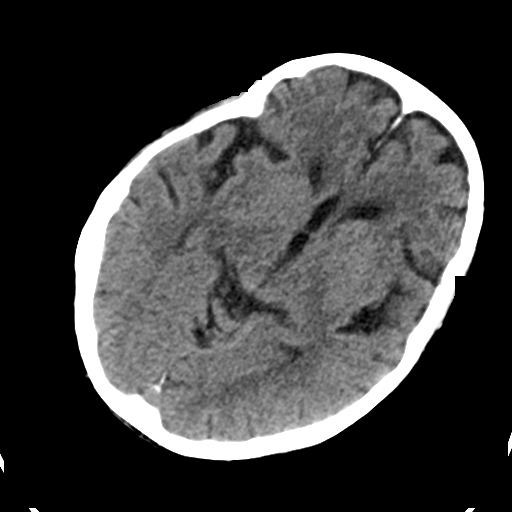

[Series 5: head bone · axial · 0.41mm/px · z∈[-145,-75]mm · 5 of 79 slices shown]
[im 8/79  bone]
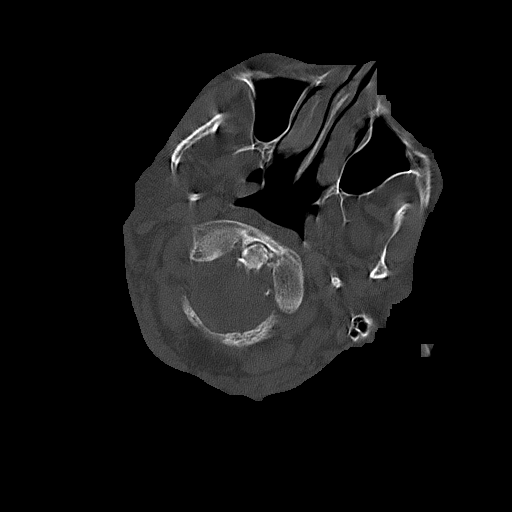
[im 15/79  bone]
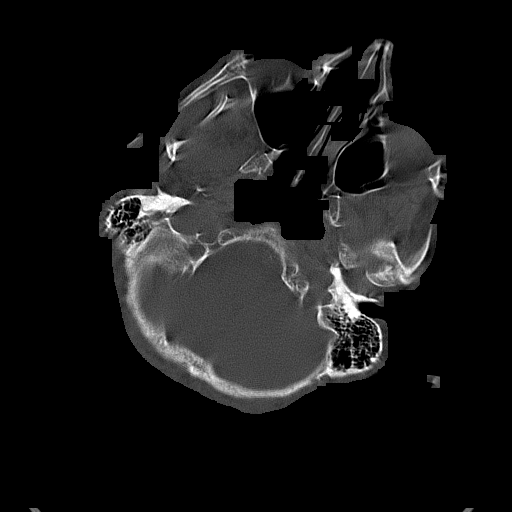
[im 29/79  bone]
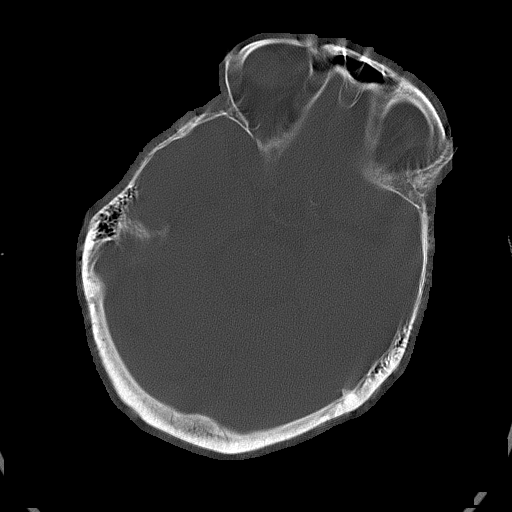
[im 36/79  bone]
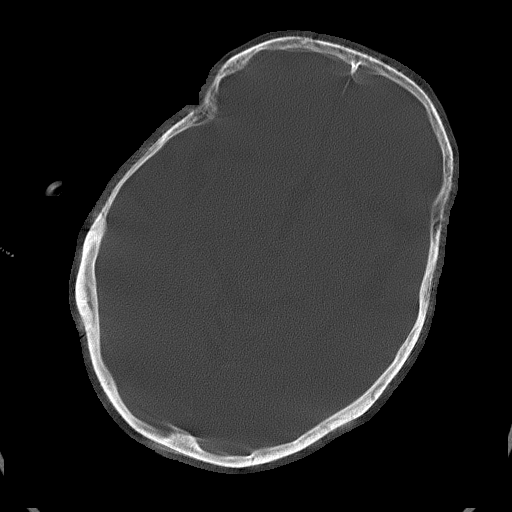
[im 43/79  bone]
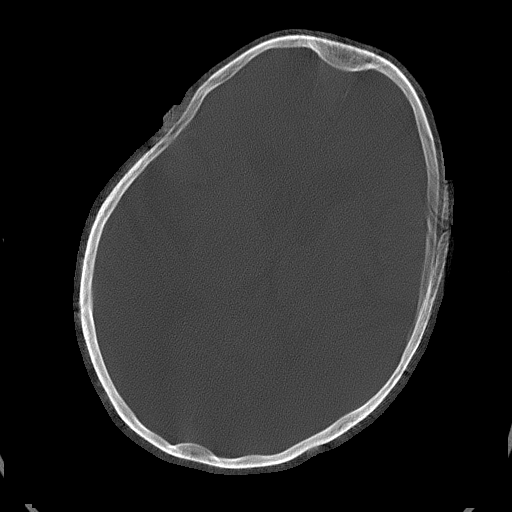

[Series 6: head without cor · coronal · non-contrast · 0.31mm/px · 3 of 67 slices shown]
[im 23/67  brain]
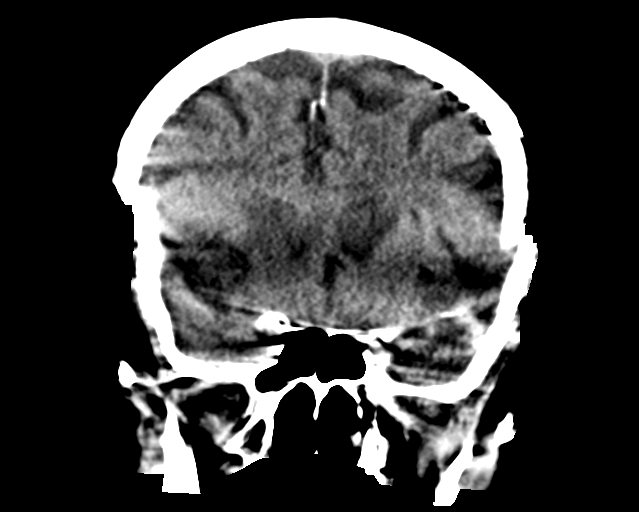
[im 30/67  brain]
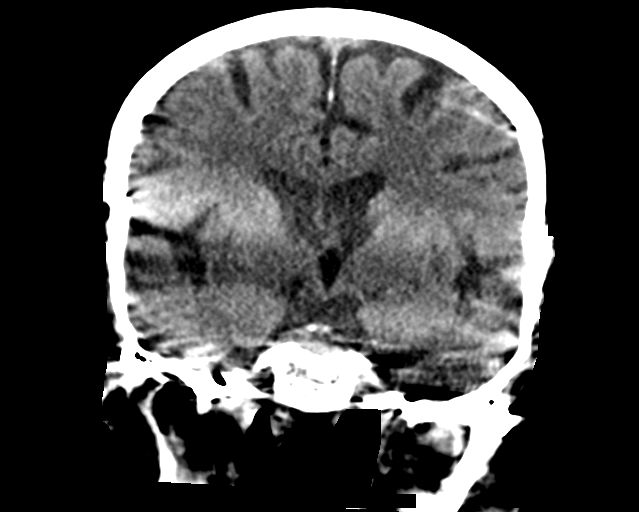
[im 37/67  brain]
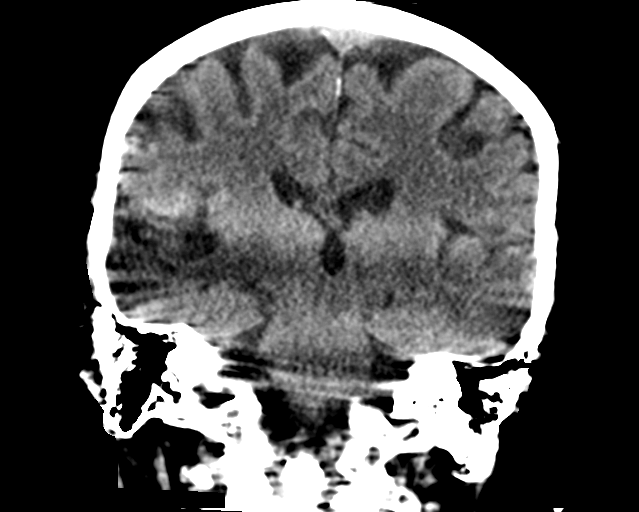

[Series 7: head without sag · sagittal · non-contrast · 0.31mm/px · 3 of 51 slices shown]
[im 17/51  brain]
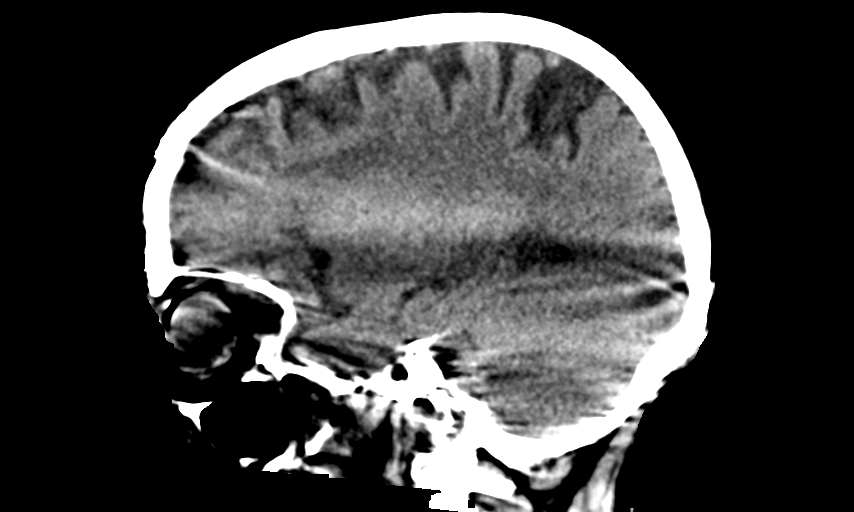
[im 26/51  brain]
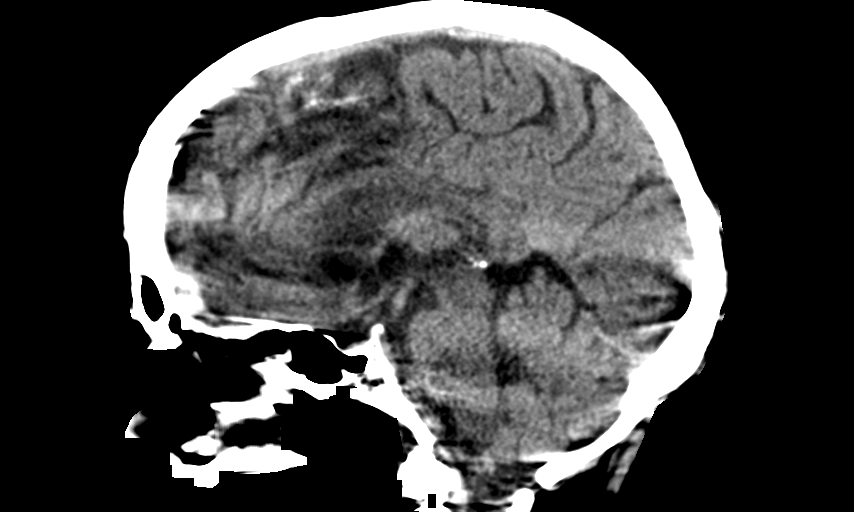
[im 34/51  brain]
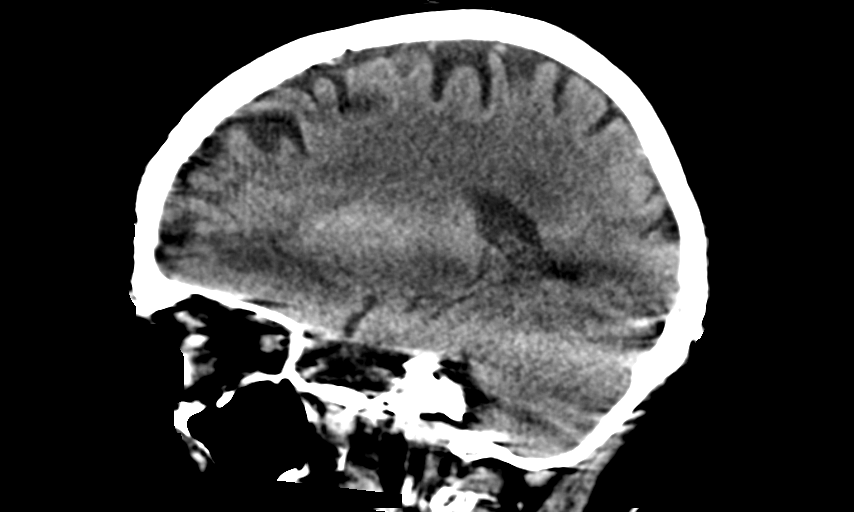

[16 of 47 positions shown; findings below may reference images not displayed]

FINDINGS: Mildly motion degraded examination.

BRAIN: No intraparenchymal hemorrhage, mass effect nor midline
shift. The ventricles and sulci are normal for age. Patchy
supratentorial white matter hypodensities within normal range for
patient's age, though non-specific are most compatible with chronic
small vessel ischemic disease. Old RIGHT cerebellar infarct. No
acute large vascular territory infarcts. No abnormal extra-axial
fluid collections. Basal cisterns are patent.

VASCULAR: Mild to moderate calcific atherosclerosis of the carotid
siphons.

SKULL: No skull fracture. Severe temporomandibular osteoarthrosis.
No significant scalp soft tissue swelling.

SINUSES/ORBITS: Small LEFT sphenoid sinus air-fluid level. RIGHT
jugular bulb dehiscence. The included ocular globes and orbital
contents are non-suspicious. Status post bilateral ocular lens
implants.

OTHER: None.
IMPRESSION: 1. No acute intracranial process on this motion degraded
examination.
2. Stable examination including old RIGHT cerebellar infarct and
mild to moderate chronic small vessel ischemic disease.

## 2018-12-20 IMAGING — CR DG CHEST 2V
2 series · 2 of 2 positions shown · non-contrast
Comparison: 10/25/2017

CLINICAL DATA: Cough for 2 weeks

EXAM:
CHEST - 2 VIEW

[w chest pa]
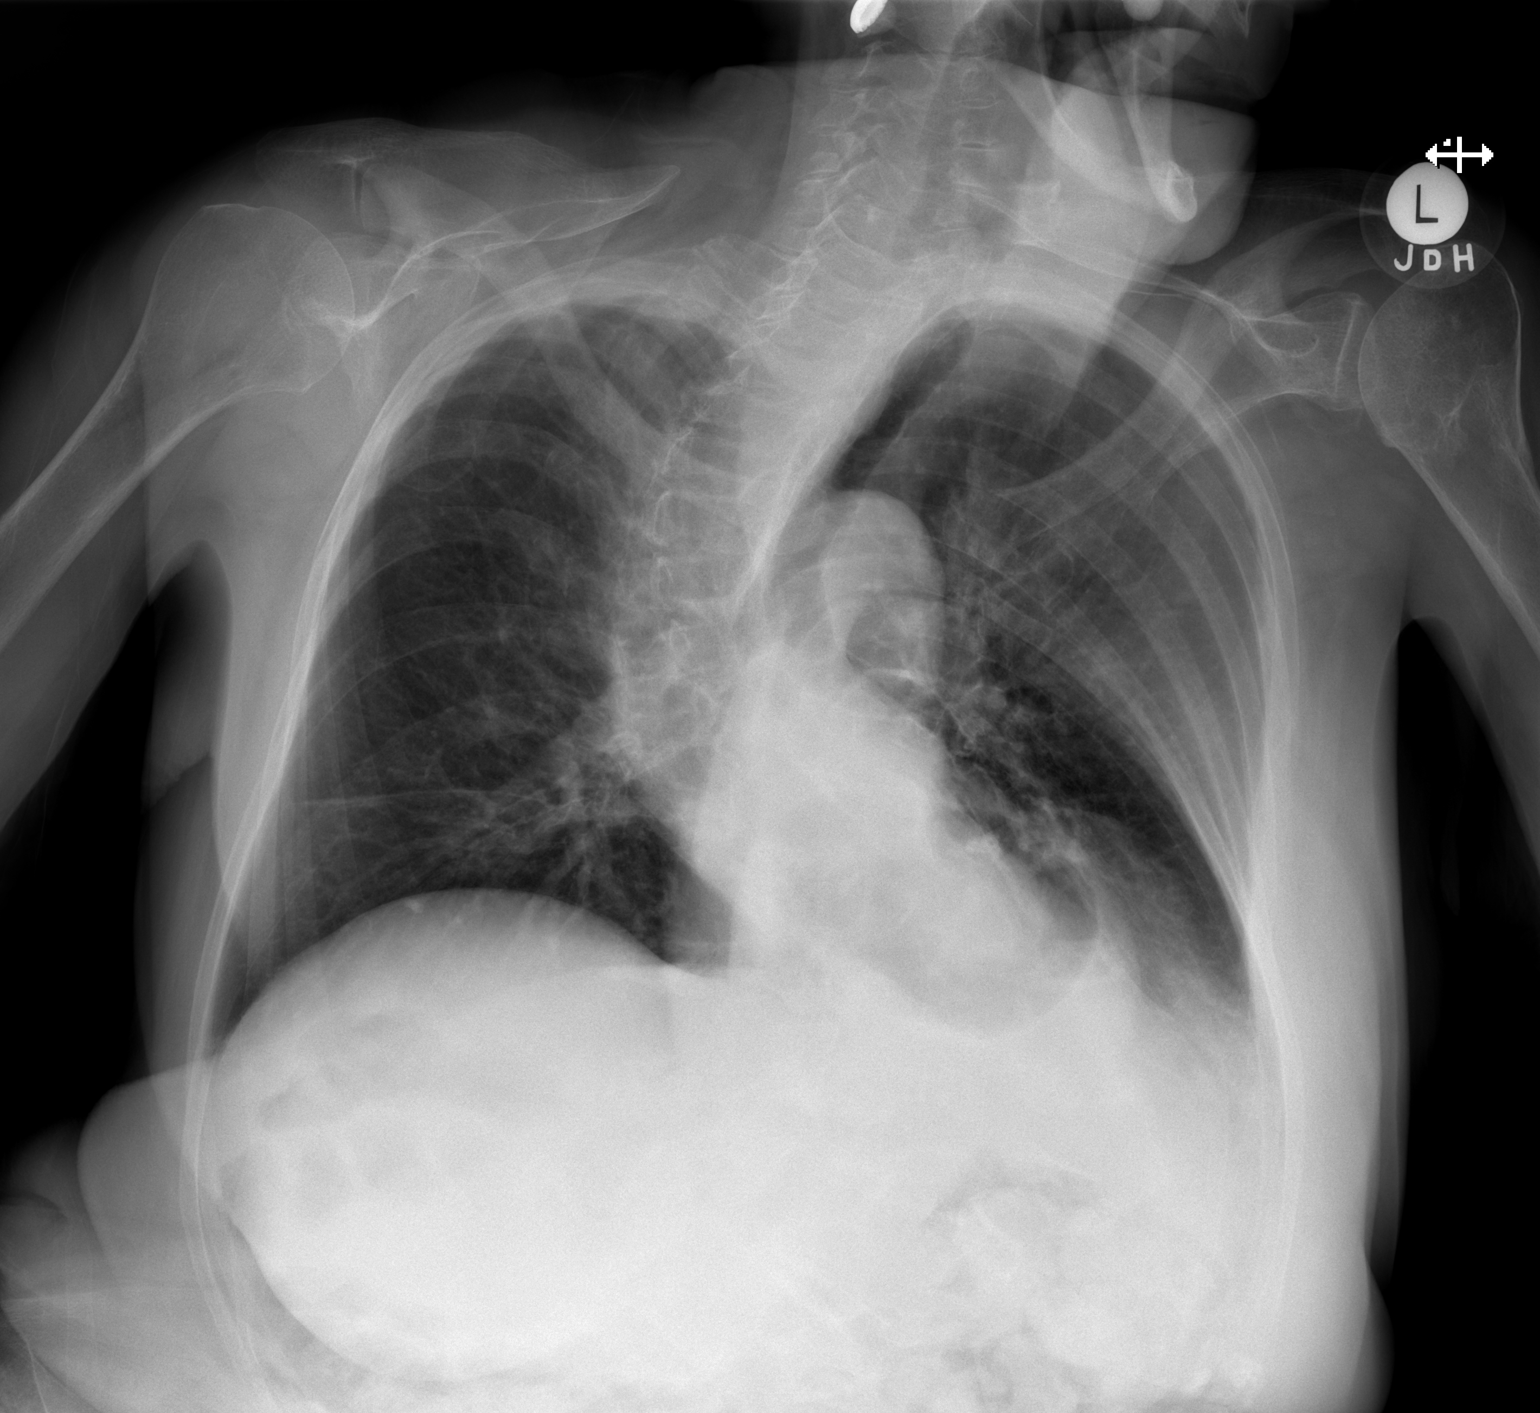

[w chest lat]
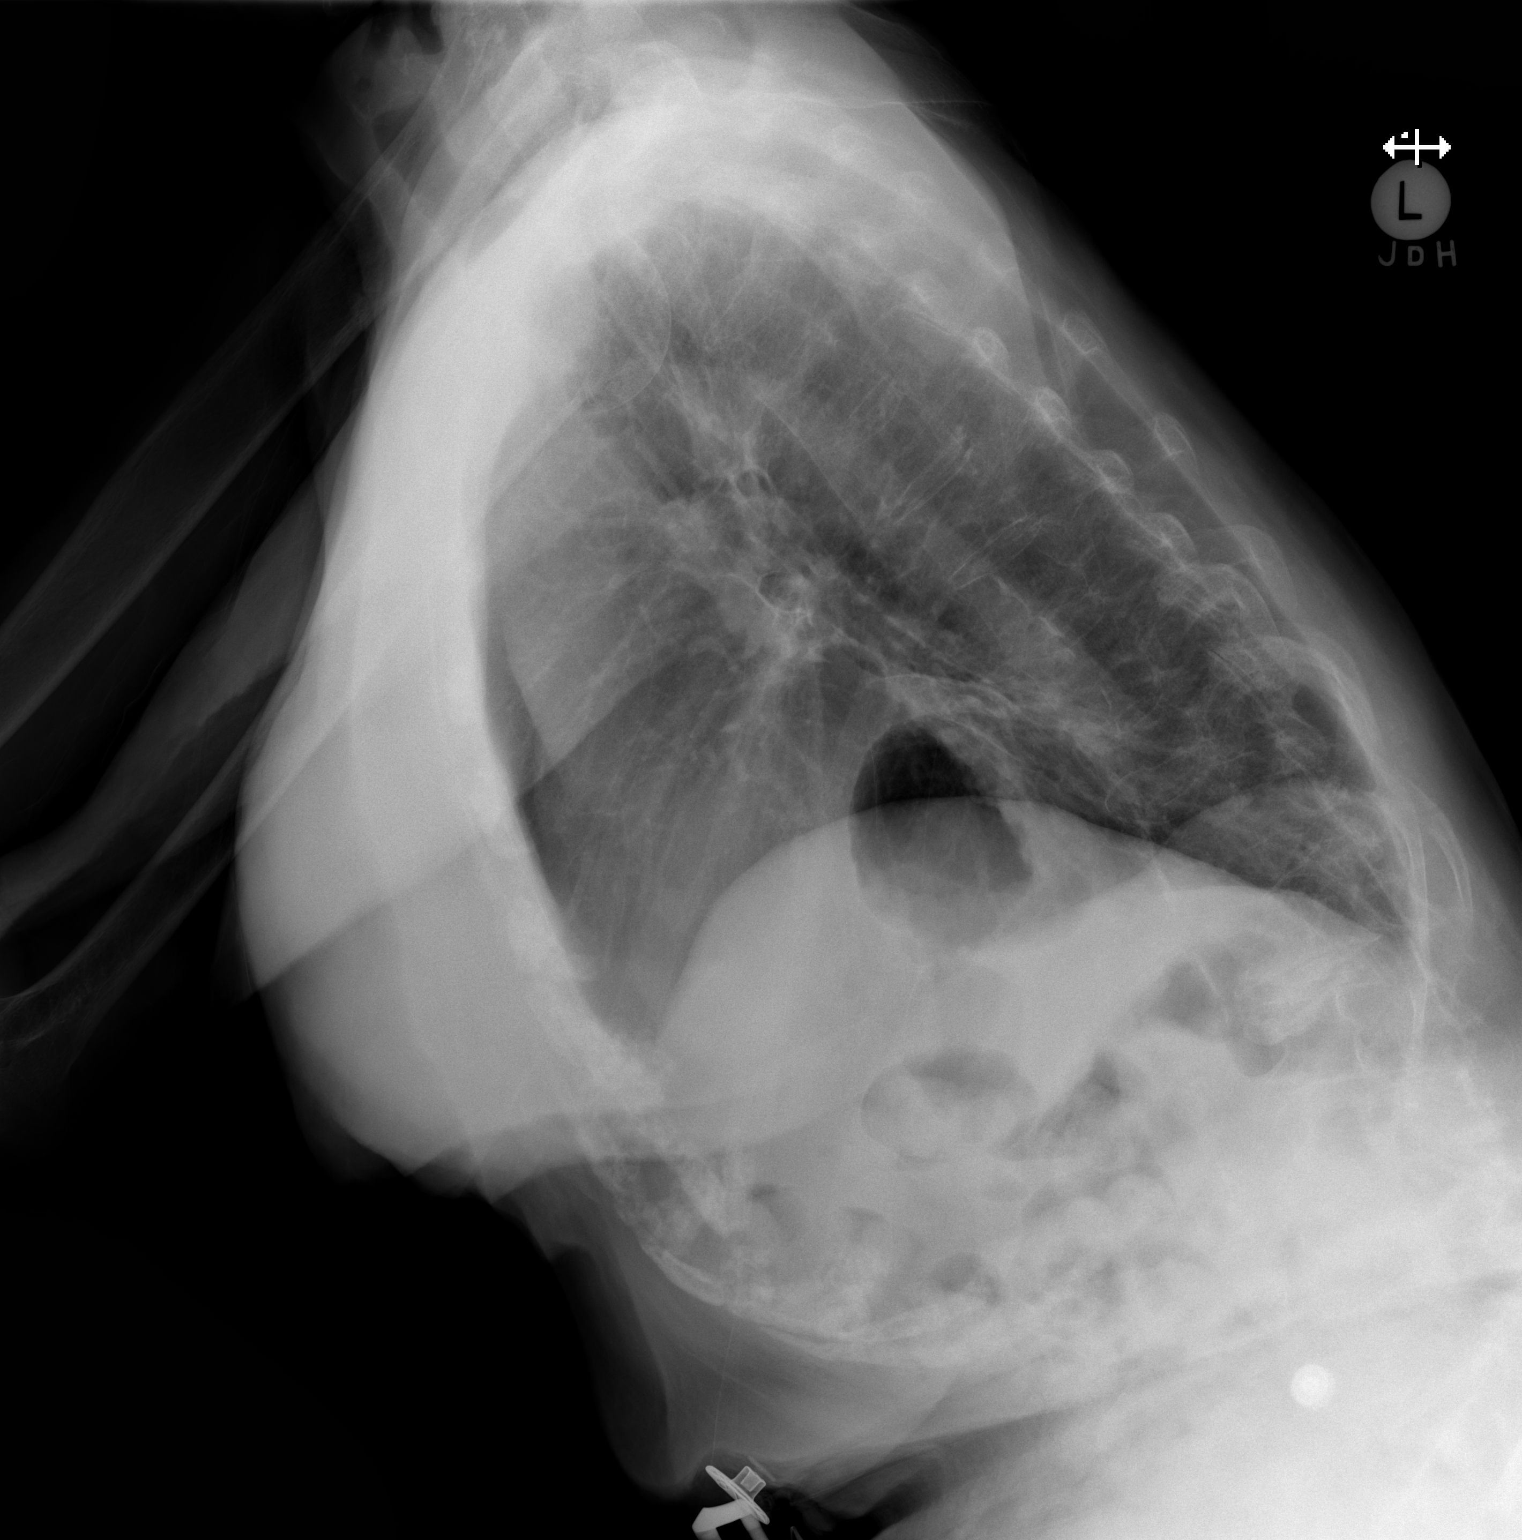

[2 of 2 positions shown; findings below may reference images not displayed]

FINDINGS: The heart remains enlarged. Patchy opacity at the left base is
nonspecific. Prominent hiatal hernia. Severe levoscoliosis of the
lower thoracic spine. Right lung is clear. Normal vascularity.
IMPRESSION: Atelectasis versus airspace disease at the left lung base.

## 2019-01-23 ENCOUNTER — Emergency Department (HOSPITAL_COMMUNITY): Payer: Medicare Other

## 2019-01-23 ENCOUNTER — Other Ambulatory Visit: Payer: Self-pay

## 2019-01-23 ENCOUNTER — Encounter (HOSPITAL_COMMUNITY): Payer: Self-pay | Admitting: Emergency Medicine

## 2019-01-23 ENCOUNTER — Emergency Department (HOSPITAL_COMMUNITY)
Admission: EM | Admit: 2019-01-23 | Discharge: 2019-01-24 | Disposition: A | Discharge status: Home / Self Care | Payer: Medicare Other | Attending: Emergency Medicine | Admitting: Emergency Medicine

## 2019-01-23 DIAGNOSIS — Z87891 Personal history of nicotine dependence: Secondary | ICD-10-CM | POA: Diagnosis not present

## 2019-01-23 DIAGNOSIS — R51 Headache: Secondary | ICD-10-CM | POA: Diagnosis not present

## 2019-01-23 DIAGNOSIS — I10 Essential (primary) hypertension: Secondary | ICD-10-CM | POA: Insufficient documentation

## 2019-01-23 DIAGNOSIS — M79652 Pain in left thigh: Secondary | ICD-10-CM | POA: Insufficient documentation

## 2019-01-23 DIAGNOSIS — W19XXXA Unspecified fall, initial encounter: Secondary | ICD-10-CM

## 2019-01-23 DIAGNOSIS — Y92009 Unspecified place in unspecified non-institutional (private) residence as the place of occurrence of the external cause: Secondary | ICD-10-CM

## 2019-01-23 DIAGNOSIS — Z7982 Long term (current) use of aspirin: Secondary | ICD-10-CM | POA: Diagnosis not present

## 2019-01-23 NOTE — ED Notes (Signed)
Bed: WA18 Expected date:  Expected time:  Means of arrival:  Comments: EMS-fall 

## 2019-01-23 NOTE — ED Provider Notes (Signed)
[facility] DEPT Provider Note   CSN: [id number] Arrival date & time: 01/23/19  2026     History   Chief Complaint Chief Complaint  Patient presents with  . Fall    HPI Angelica Bell is a 85 y.o. female.  85yo F w/ PMH including chronic back pain, HTN, distant h/o PE who p/w fall.  Yesterday, patient was walking with her walker and thinks that she hit her walker on something, causing her to fall.  She does not recall details of the fall but states that she did not lose consciousness.  Since then she has been having severe, constant pain in her left, posterior mid thigh.  She also reports back pain which she states is all over her back.  She thinks that she hit her head because her forehead is sore.  No chest or abdominal pain.  No vomiting.  The history is provided by the patient.  Fall     Past Medical History:  Diagnosis Date  . Chronic back pain    with degenerative joint disease  . Chronic pain disorder    with narcotic management  . Dry eyes   . Hypertension   . Osteoporosis   . PE (pulmonary embolism)    After hysterectomy in 1967  . Pneumonia     Patient Active Problem List   Diagnosis Date Noted  . Peripheral neuropathy   . Drug overdose   . Polypharmacy   . Altered mental status 10/25/2017  . Closed displaced intertrochanteric fracture of left femur ([place]) 02/16/2017  . Hip fracture, right ([place]) 12/25/2015  . Chronic pain 12/24/2015  . Fracture of femoral neck, right, closed ([place]) 12/24/2015  . Essential hypertension 12/24/2015  . Community acquired pneumonia 04/15/2013  . Hypokalemia 04/15/2013  . Labial cyst 02/08/2012  . POSTMENOPAUSAL SYNDROME 11/28/2009  . PERIPHERAL NEUROPATHY, LOWER EXTREMITY, RIGHT 02/22/2009  . INSOMNIA-SLEEP DISORDER-UNSPEC 10/12/2008  . Lumbago 08/02/2008  . 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 ANXIETY DEPRESSION 12/16/2007  . HYPERLIPIDEMIA 07/08/2007  . Osteoarthritis  07/08/2007  . Osteoporosis 07/08/2007  . SCOLIOSIS NEC 07/08/2007    Past Surgical History:  Procedure Laterality Date  . ABDOMINAL HYSTERECTOMY    . APPENDECTOMY    . BREAST ENHANCEMENT SURGERY    . FEMUR IM NAIL Left 02/17/2017   Procedure: INTRAMEDULLARY (IM) NAIL FEMORAL;  Surgeon: Nicholes Stairs, MD;  Location: [facility];  Service: Orthopedics;  Laterality: Left;  . HIP ARTHROPLASTY Right 12/24/2015   Procedure: ARTHROPLASTY BIPOLAR HIP (HEMIARTHROPLASTY);  Surgeon: Netta Cedars, MD;  Location: [facility];  Service: Orthopedics;  Laterality: Right;  . TONSILLECTOMY       OB History   No obstetric history on file.      Home Medications    Prior to Admission medications   Medication Sig Start Date End Date Taking? Authorizing Provider  aspirin EC 81 MG tablet Take 1 tablet (81 mg total) by mouth 2 (two) times daily. Patient taking differently: Take 81 mg by mouth daily.  02/19/17   Bonnielee Haff, MD  gabapentin (NEURONTIN) 100 MG capsule Take 2 capsules (200 mg total) by mouth 3 (three) times daily. 10/31/17 11/30/17  Sherene Sires, DO    Family History Family History  Problem Relation Age of Onset  . Heart Problems Mother     Social History Social History   Tobacco Use  . Smoking status: Former Smoker    Years: 13.00    Last attempt to quit: 04/15/1976  Years since quitting: 42.8  . Smokeless tobacco: Never Used  Substance Use Topics  . Alcohol use: No  . Drug use: No     Allergies   Hctz [hydrochlorothiazide]; Amoxicillin-pot clavulanate; Lisinopril-hydrochlorothiazide; Sulfa antibiotics; and Sulfonamide derivatives   Review of Systems Review of Systems All other systems reviewed and are negative except that which was mentioned in HPI   Physical Exam Updated Vital Signs BP (!) 141/96 (BP Location: Left Arm)   Temp 98.1 F (36.7 C) (Oral)   Ht 5' (1.524 m)   Wt 55.8 kg   SpO2 98%   BMI 24.02 kg/m   Physical Exam Vitals signs and nursing note  reviewed.  Constitutional:      General: She is not in acute distress.    Appearance: She is well-developed.     Comments: Uncomfortable, anxious  HENT:     Head: Normocephalic and atraumatic.  Eyes:     Conjunctiva/sclera: Conjunctivae normal.     Pupils: Pupils are equal, round, and reactive to light.  Neck:     Musculoskeletal: Normal range of motion and neck supple.     Comments: No midline cervical spine tenderness Cardiovascular:     Rate and Rhythm: Normal rate and regular rhythm.     Pulses: Normal pulses.     Heart sounds: Normal heart sounds. No murmur.  Pulmonary:     Effort: Pulmonary effort is normal.     Breath sounds: Normal breath sounds.  Abdominal:     General: Bowel sounds are normal. There is no distension.     Palpations: Abdomen is soft.     Tenderness: There is no abdominal tenderness.  Musculoskeletal: Normal range of motion.        General: Tenderness present. No deformity.     Comments: Tenderness of posterior L mid thigh without obvious deformity; no leg length discrepancy  Skin:    General: Skin is warm and dry.  Neurological:     Mental Status: She is alert and oriented to person, place, and time.     Sensory: No sensory deficit.     Comments: Fluent speech  Psychiatric:        Judgment: Judgment normal.     Comments: Anxious      ED Treatments / Results  Labs (all labs ordered are listed, but only abnormal results are displayed) Labs Reviewed - No data to display  EKG None  Radiology Dg Thoracic Spine 2 View  Result Date: 01/23/2019 CLINICAL DATA:  Patient fell 24 hours ago and presents with left thigh and lower back pain. Patient noted to be incontinent. EXAM: THORACIC SPINE 2 VIEWS COMPARISON:  CXR 04/22/2018 and 02/16/2017 FINDINGS: Stable S-shaped scoliosis of the thoracolumbar spine with dextroconvex curvature of the thoracic spine and apex at T7. Levoscoliosis of the lumbar spine with apex at the L2-3 disc space is noted. Chronic  moderate T12 compression deformity is stable. Multilevel disc flattening due to the scoliosis is noted along the thoracolumbar spine. No acute osseous appearing abnormality. IMPRESSION: 1. No acute osseous appearing abnormality of the thoracic spine. If the patient has pain out of proportion to radiographic findings, dedicated CT of the affected area of the dorsal spine may help detect radiographically occult fractures. 2. Chronic stable moderate T12 compression deformity dating back to 02/16/2017 chest radiograph is suggested. 3. Stable S-shaped scoliosis of the thoracolumbar spine. Electronically Signed   By: Ashley Royalty M.D.   On: 01/23/2019 21:47   Dg Lumbar Spine Complete  Result  Date: 01/23/2019 CLINICAL DATA:  Pain after fall EXAM: LUMBAR SPINE - COMPLETE 4+ VIEW COMPARISON:  01/18/2014 and 02/16/2017 FINDINGS: Levoconvex curvature of the lumbar spine with apex at approximately L2 is seen. Subtle increase in height loss of the T11 vertebral body involving the superior endplate is identified which may represent an acute on chronic fracture. This is better visualized than on the thoracic spine radiographs. If there is pain correlated to this level, consider cross-sectional imaging for further detail. Partially included right hip arthroplasty and left femoral nail fixation hardware are identified. No pelvic diastasis to the extent visualized. Multilevel degenerative facet arthropathy is seen. IMPRESSION: Subtle increase in height loss of the T11 vertebral body involving the superior endplate which may represent an acute on chronic fracture. If there is pain correlated to this level, consider cross-sectional imaging for further detail. Stable levoconvex curvature of the lumbar spine. Lumbar spondylosis with multilevel degenerative disc and facet arthropathy. Electronically Signed   By: Ashley Royalty M.D.   On: 01/23/2019 21:58   Ct Head Wo Contrast  Result Date: 01/23/2019 CLINICAL DATA:  Headache after  fall EXAM: CT HEAD WITHOUT CONTRAST CT CERVICAL SPINE WITHOUT CONTRAST TECHNIQUE: Multidetector CT imaging of the head and cervical spine was performed following the standard protocol without intravenous contrast. Multiplanar CT image reconstructions of the cervical spine were also generated. COMPARISON:  None. FINDINGS: CT HEAD FINDINGS BRAIN: There is mild sulcal and ventricular prominence consistent with age related involutional change and atrophy. No intraparenchymal hemorrhage, mass effect nor midline shift. Periventricular and subcortical white matter hypodensities consistent with chronic small vessel ischemic disease are identified. No acute large vascular territory infarcts. No abnormal extra-axial fluid collections. Basal cisterns are not effaced and midline. VASCULAR: Moderate calcific atherosclerosis of the carotid siphons. SKULL: No skull fracture. No significant scalp soft tissue swelling. SINUSES/ORBITS: The mastoid air-cells are clear. The included paranasal sinuses are well-aerated.The included ocular globes and orbital contents are non-suspicious. Bilateral lens replacements. OTHER: None. CT CERVICAL SPINE FINDINGS Alignment: Slight straightening of cervical lordosis. Skull base and vertebrae: No acute fracture. No primary bone lesion or focal pathologic process. Soft tissues and spinal canal: No prevertebral fluid or swelling. No visible canal hematoma. Disc levels: Moderate to marked disc flattening C2 through C7 greatest at C4-5 and C5-6 with small posterior marginal osteophytes. No significant central or foraminal encroachment. No jumped or perched facets. Uncovertebral joint spurring osteoarthritis is identified from C4 through C7 bilaterally. Upper chest: Biapical pleuroparenchymal scarring. Other: Extracranial carotid arteriosclerosis. Vertebral arteriosclerosis. IMPRESSION: 1. Atrophy with chronic small vessel ischemic disease. No acute intracranial abnormality. 2. Cervical spondylosis  without acute posttraumatic cervical spine fracture or subluxation. Electronically Signed   By: Ashley Royalty M.D.   On: 01/23/2019 22:04   Ct Cervical Spine Wo Contrast  Result Date: 01/23/2019 CLINICAL DATA:  Headache after fall EXAM: CT HEAD WITHOUT CONTRAST CT CERVICAL SPINE WITHOUT CONTRAST TECHNIQUE: Multidetector CT imaging of the head and cervical spine was performed following the standard protocol without intravenous contrast. Multiplanar CT image reconstructions of the cervical spine were also generated. COMPARISON:  None. FINDINGS: CT HEAD FINDINGS BRAIN: There is mild sulcal and ventricular prominence consistent with age related involutional change and atrophy. No intraparenchymal hemorrhage, mass effect nor midline shift. Periventricular and subcortical white matter hypodensities consistent with chronic small vessel ischemic disease are identified. No acute large vascular territory infarcts. No abnormal extra-axial fluid collections. Basal cisterns are not effaced and midline. VASCULAR: Moderate calcific atherosclerosis of the carotid siphons. SKULL:  No skull fracture. No significant scalp soft tissue swelling. SINUSES/ORBITS: The mastoid air-cells are clear. The included paranasal sinuses are well-aerated.The included ocular globes and orbital contents are non-suspicious. Bilateral lens replacements. OTHER: None. CT CERVICAL SPINE FINDINGS Alignment: Slight straightening of cervical lordosis. Skull base and vertebrae: No acute fracture. No primary bone lesion or focal pathologic process. Soft tissues and spinal canal: No prevertebral fluid or swelling. No visible canal hematoma. Disc levels: Moderate to marked disc flattening C2 through C7 greatest at C4-5 and C5-6 with small posterior marginal osteophytes. No significant central or foraminal encroachment. No jumped or perched facets. Uncovertebral joint spurring osteoarthritis is identified from C4 through C7 bilaterally. Upper chest: Biapical  pleuroparenchymal scarring. Other: Extracranial carotid arteriosclerosis. Vertebral arteriosclerosis. IMPRESSION: 1. Atrophy with chronic small vessel ischemic disease. No acute intracranial abnormality. 2. Cervical spondylosis without acute posttraumatic cervical spine fracture or subluxation. Electronically Signed   By: Ashley Royalty M.D.   On: 01/23/2019 22:04   Dg Hip Unilat With Pelvis 2-3 Views Left  Result Date: 01/23/2019 CLINICAL DATA:  Recent fall with hip pain, initial encounter EXAM: DG HIP (WITH OR WITHOUT PELVIS) 2-3V LEFT COMPARISON:  07/29/2017 FINDINGS: Pelvic ring is intact. Postsurgical changes in the proximal femurs are noted bilaterally. Degenerative changes and scoliosis of the lumbar spine is seen. No fracture or dislocation is noted. IMPRESSION: Postsurgical changes without acute abnormality. Electronically Signed   By: Inez Catalina M.D.   On: 01/23/2019 21:04    Procedures Procedures (including critical care time)  Medications Ordered in ED Medications - No data to display   Initial Impression / Assessment and Plan / ED Course  I have reviewed the triage vital signs and the nursing notes.  Pertinent imaging results that were available during my care of the patient were reviewed by me and considered in my medical decision making (see chart for details).    No external signs of trauma on PE, alert and reassuring VS. XR of spine, L hip negative acute. CT head and c-spine negative acute. Patient ambulated to the bathroom with her walker multiple times after arrival to the ED. Eating and chatting on reassessment. Discussed supportive measures.  Final Clinical Impressions(s) / ED Diagnoses   Final diagnoses:  Fall in home, initial encounter  Left thigh pain    ED Discharge Orders    None       Eldor Conaway, Wenda Overland, MD 01/23/19 2253

## 2019-01-23 NOTE — ED Notes (Signed)
Bed: [facility] Expected date:  Expected time:  Means of arrival:  Comments: 

## 2019-01-23 NOTE — ED Notes (Signed)
Patient ambulatory to the bathroom, brisk pace with the walker.

## 2019-01-23 NOTE — ED Triage Notes (Signed)
Patient states that she fell 24 hours ago. EMS states patient was incontinent. Patient is complaining left posterior left thigh pain and lower back pain. Patient walked to the bathroom when she got to the ed with her walker.

## 2019-01-23 NOTE — ED Notes (Signed)
Bed: WA27 Expected date:  Expected time:  Means of arrival:  Comments: Ned Grace

## 2019-01-24 NOTE — ED Notes (Signed)
Grandson arrived to transport pt home. Discharge instructions provided and reviewed. Pt dressed and left department via w/c

## 2019-01-24 NOTE — ED Notes (Signed)
Called grandson several times. He keeps picking up the phone and hanging up. Left messages. Called son and neighbor and no answer. Left messages on the answer machine.

## 2019-01-24 NOTE — ED Notes (Signed)
Called [facility]PS at [phone] to report family will not pick up patient. Patient was trying to walk 20 miles away. Patient does not have her house key.

## 2019-01-24 NOTE — ED Notes (Signed)
Pt's grandson, [name] will be here to p/u this morning. [name] [phone]. Pt is here for safety, prevent falls.

## 2019-01-29 IMAGING — US US EXTREM LOW VENOUS*L*
1 series · 13 of 24 positions shown · non-contrast
Comparison: Bilateral lower extremity venous Doppler ultrasound -
08/01/2016

CLINICAL DATA: Left lower extremity edema.  Evaluate for DVT.



[Series 1: us extrem low venous*left* · 0.07mm/px · 13 of 34 slices shown]
[im 1/34]
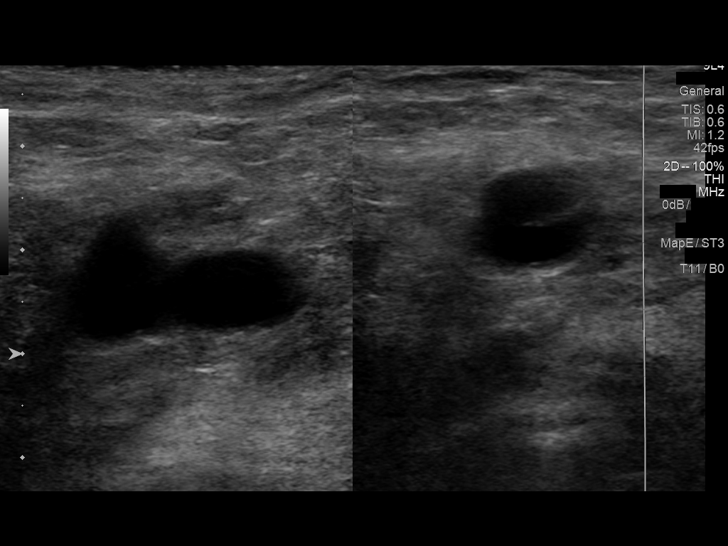
[im 3/34]
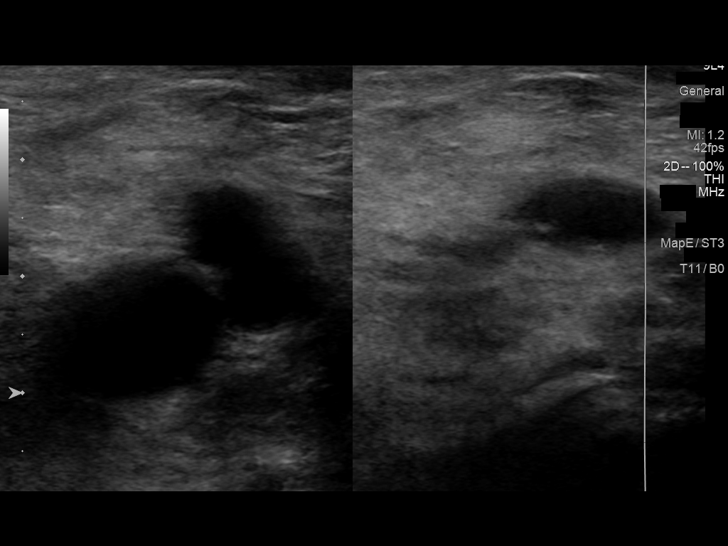
[im 6/34]
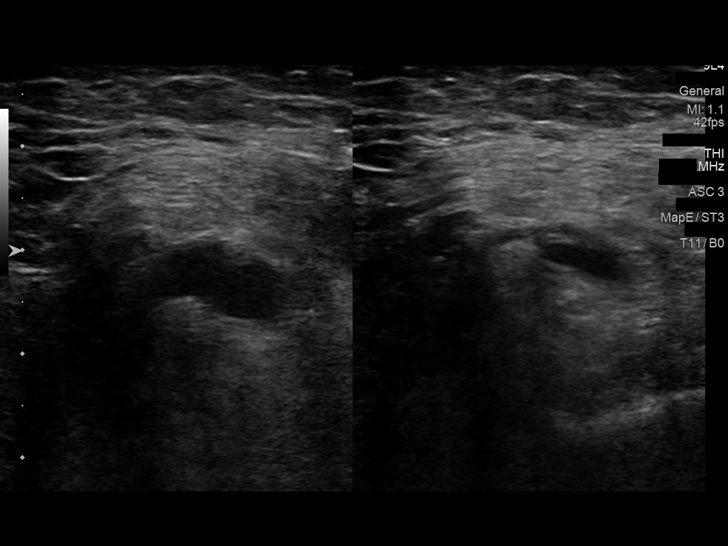
[im 9/34]
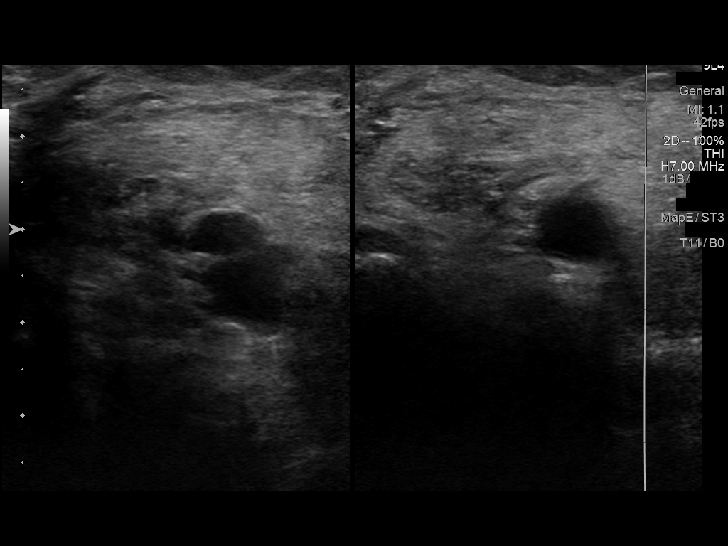
[im 12/34]
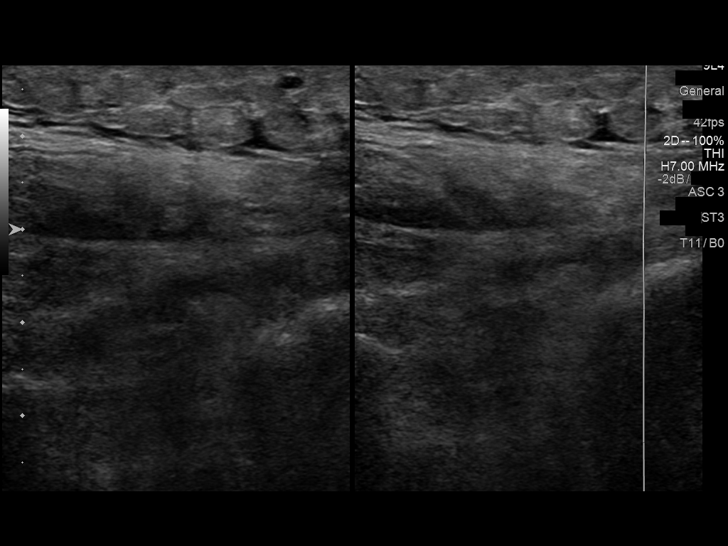
[im 15/34]
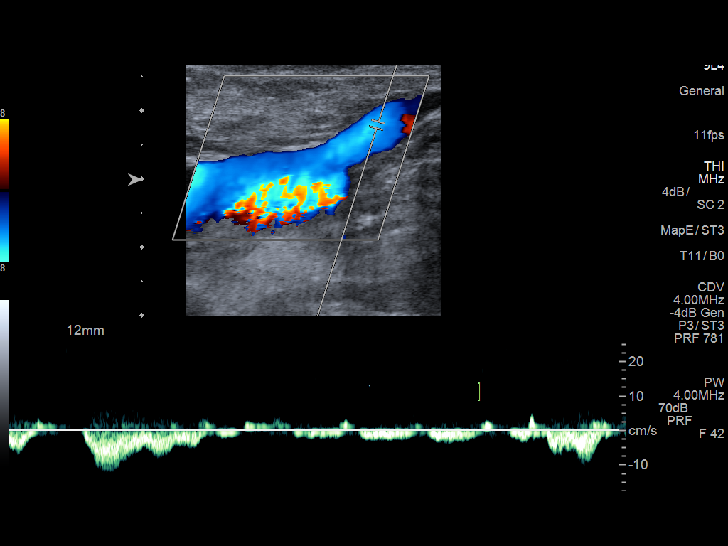
[im 18/34]
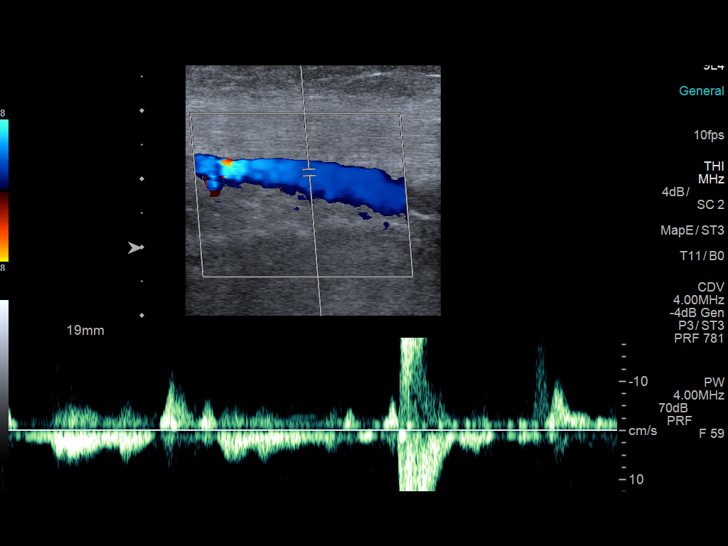
[im 19/34]
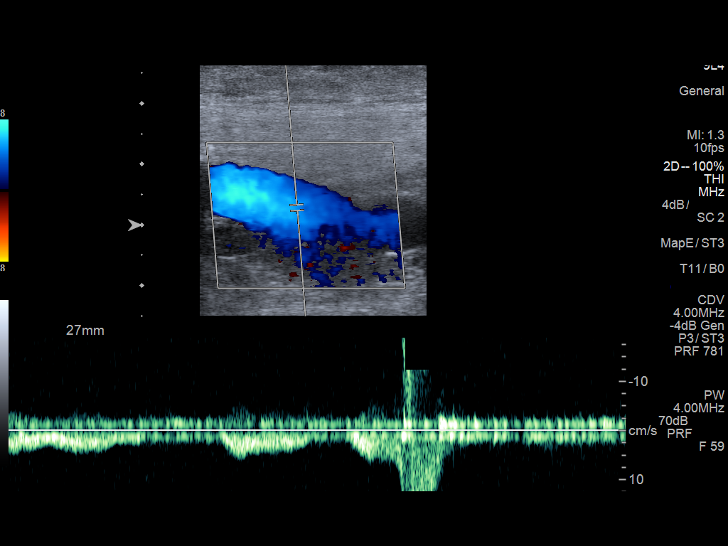
[im 22/34]
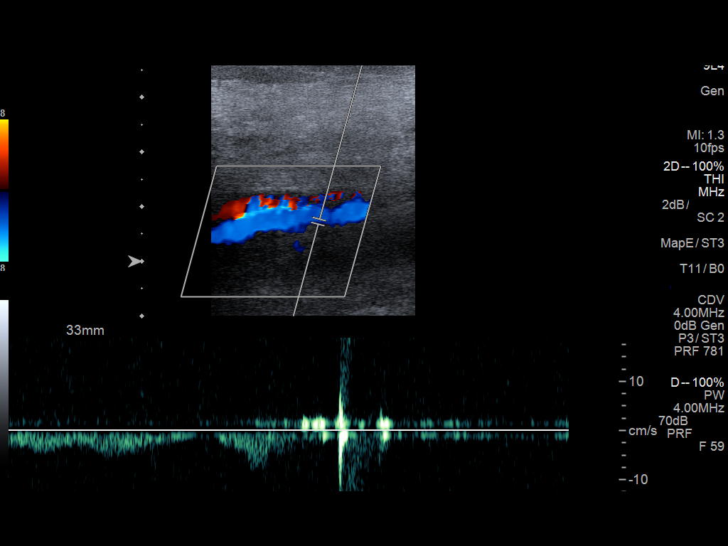
[im 25/34]
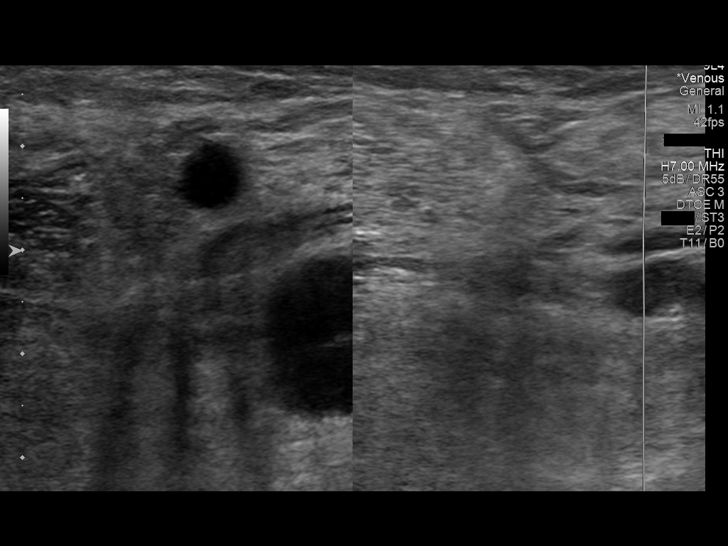
[im 28/34]
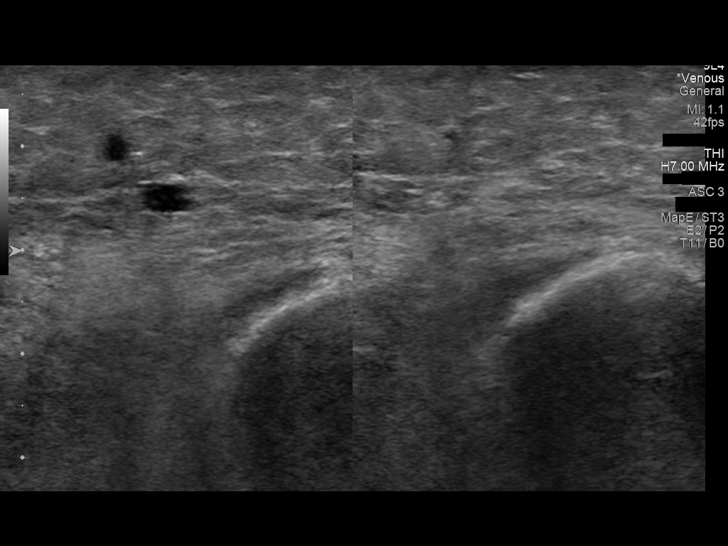
[im 31/34]
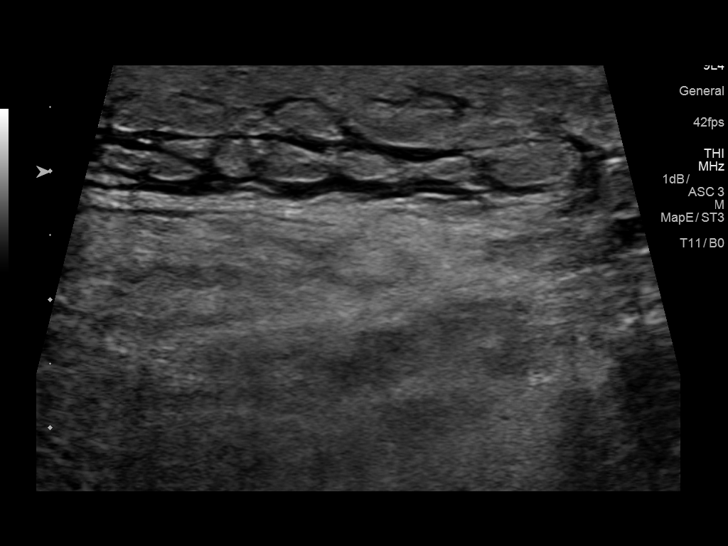
[im 34/34]
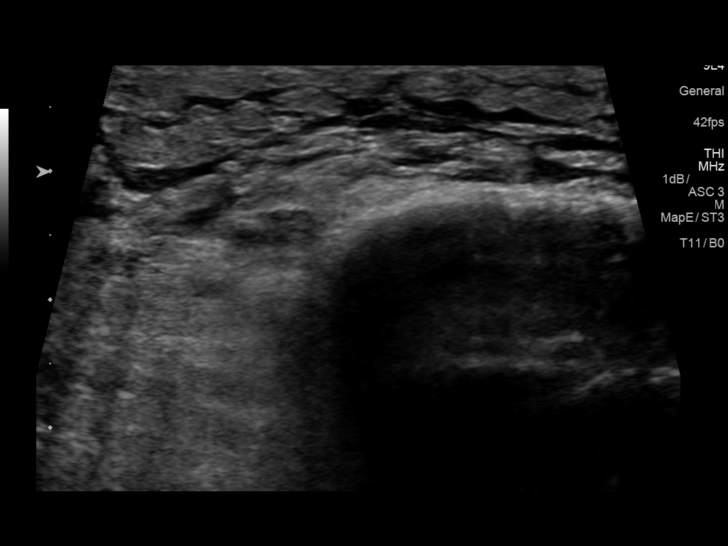

[13 of 24 positions shown; findings below may reference images not displayed]

FINDINGS: Contralateral Common Femoral Vein: Respiratory phasicity is normal
and symmetric with the symptomatic side. No evidence of thrombus.
Normal compressibility.

Common Femoral Vein: No evidence of thrombus. Normal
compressibility, respiratory phasicity and response to augmentation.

Saphenofemoral Junction: No evidence of thrombus. Normal
compressibility and flow on color Doppler imaging.

Profunda Femoral Vein: No evidence of thrombus. Normal
compressibility and flow on color Doppler imaging.

Femoral Vein: No evidence of thrombus. Normal compressibility,
respiratory phasicity and response to augmentation.

Popliteal Vein: No evidence of thrombus. Normal compressibility,
respiratory phasicity and response to augmentation.

Calf Veins: No evidence of thrombus. Normal compressibility and flow
on color Doppler imaging.

Superficial Great Saphenous Vein: No evidence of thrombus. Normal
compressibility and flow on color Doppler imaging.

Venous Reflux:  None.

Other Findings: There is a moderate amount of subcutaneous edema
noted at the level of the left calf and ankle.
IMPRESSION: 1. No evidence of DVT within the left lower extremity.
2. Moderate amount of subcutaneous edema at the level of the left
calf and ankle.

## 2019-07-25 ENCOUNTER — Inpatient Hospital Stay (HOSPITAL_COMMUNITY)
Admission: EM | Admit: 2019-07-25 | Discharge: 2019-08-07 | DRG: 682 | Disposition: A | Discharge status: Skilled Nursing Facility | Payer: Medicare Other | Attending: Family Medicine | Admitting: Family Medicine

## 2019-07-25 ENCOUNTER — Encounter (HOSPITAL_COMMUNITY): Payer: Self-pay | Admitting: Emergency Medicine

## 2019-07-25 ENCOUNTER — Emergency Department (HOSPITAL_COMMUNITY): Payer: Medicare Other

## 2019-07-25 ENCOUNTER — Other Ambulatory Visit: Payer: Self-pay

## 2019-07-25 DIAGNOSIS — G8324 Monoplegia of upper limb affecting left nondominant side: Secondary | ICD-10-CM | POA: Diagnosis not present

## 2019-07-25 DIAGNOSIS — T7611XA Adult physical abuse, suspected, initial encounter: Secondary | ICD-10-CM | POA: Diagnosis present

## 2019-07-25 DIAGNOSIS — R402354 Coma scale, best motor response, localizes pain, 24 hours or more after hospital admission: Secondary | ICD-10-CM | POA: Diagnosis not present

## 2019-07-25 DIAGNOSIS — M1990 Unspecified osteoarthritis, unspecified site: Secondary | ICD-10-CM | POA: Diagnosis present

## 2019-07-25 DIAGNOSIS — W19XXXA Unspecified fall, initial encounter: Secondary | ICD-10-CM

## 2019-07-25 DIAGNOSIS — M25532 Pain in left wrist: Secondary | ICD-10-CM

## 2019-07-25 DIAGNOSIS — Z86711 Personal history of pulmonary embolism: Secondary | ICD-10-CM

## 2019-07-25 DIAGNOSIS — S99911A Unspecified injury of right ankle, initial encounter: Secondary | ICD-10-CM | POA: Diagnosis present

## 2019-07-25 DIAGNOSIS — T796XXA Traumatic ischemia of muscle, initial encounter: Secondary | ICD-10-CM | POA: Diagnosis present

## 2019-07-25 DIAGNOSIS — E785 Hyperlipidemia, unspecified: Secondary | ICD-10-CM | POA: Diagnosis present

## 2019-07-25 DIAGNOSIS — N3281 Overactive bladder: Secondary | ICD-10-CM | POA: Diagnosis not present

## 2019-07-25 DIAGNOSIS — M549 Dorsalgia, unspecified: Secondary | ICD-10-CM | POA: Diagnosis present

## 2019-07-25 DIAGNOSIS — Z888 Allergy status to other drugs, medicaments and biological substances status: Secondary | ICD-10-CM

## 2019-07-25 DIAGNOSIS — I6340 Cerebral infarction due to embolism of unspecified cerebral artery: Secondary | ICD-10-CM | POA: Diagnosis not present

## 2019-07-25 DIAGNOSIS — N179 Acute kidney failure, unspecified: Principal | ICD-10-CM | POA: Diagnosis present

## 2019-07-25 DIAGNOSIS — Z882 Allergy status to sulfonamides status: Secondary | ICD-10-CM

## 2019-07-25 DIAGNOSIS — R0781 Pleurodynia: Secondary | ICD-10-CM | POA: Diagnosis not present

## 2019-07-25 DIAGNOSIS — Z96641 Presence of right artificial hip joint: Secondary | ICD-10-CM | POA: Diagnosis present

## 2019-07-25 DIAGNOSIS — E876 Hypokalemia: Secondary | ICD-10-CM | POA: Diagnosis present

## 2019-07-25 DIAGNOSIS — R402134 Coma scale, eyes open, to sound, 24 hours or more after hospital admission: Secondary | ICD-10-CM | POA: Diagnosis not present

## 2019-07-25 DIAGNOSIS — N3000 Acute cystitis without hematuria: Secondary | ICD-10-CM | POA: Diagnosis not present

## 2019-07-25 DIAGNOSIS — I639 Cerebral infarction, unspecified: Secondary | ICD-10-CM

## 2019-07-25 DIAGNOSIS — R296 Repeated falls: Secondary | ICD-10-CM | POA: Diagnosis present

## 2019-07-25 DIAGNOSIS — M25579 Pain in unspecified ankle and joints of unspecified foot: Secondary | ICD-10-CM

## 2019-07-25 DIAGNOSIS — G629 Polyneuropathy, unspecified: Secondary | ICD-10-CM | POA: Diagnosis present

## 2019-07-25 DIAGNOSIS — Z20828 Contact with and (suspected) exposure to other viral communicable diseases: Secondary | ICD-10-CM | POA: Diagnosis present

## 2019-07-25 DIAGNOSIS — M419 Scoliosis, unspecified: Secondary | ICD-10-CM | POA: Diagnosis present

## 2019-07-25 DIAGNOSIS — R945 Abnormal results of liver function studies: Secondary | ICD-10-CM

## 2019-07-25 DIAGNOSIS — R402244 Coma scale, best verbal response, confused conversation, 24 hours or more after hospital admission: Secondary | ICD-10-CM | POA: Diagnosis not present

## 2019-07-25 DIAGNOSIS — Z881 Allergy status to other antibiotic agents status: Secondary | ICD-10-CM

## 2019-07-25 DIAGNOSIS — E872 Acidosis: Secondary | ICD-10-CM | POA: Diagnosis not present

## 2019-07-25 DIAGNOSIS — E871 Hypo-osmolality and hyponatremia: Secondary | ICD-10-CM | POA: Diagnosis present

## 2019-07-25 DIAGNOSIS — E46 Unspecified protein-calorie malnutrition: Secondary | ICD-10-CM | POA: Diagnosis not present

## 2019-07-25 DIAGNOSIS — Z8249 Family history of ischemic heart disease and other diseases of the circulatory system: Secondary | ICD-10-CM

## 2019-07-25 DIAGNOSIS — E860 Dehydration: Secondary | ICD-10-CM | POA: Diagnosis present

## 2019-07-25 DIAGNOSIS — M810 Age-related osteoporosis without current pathological fracture: Secondary | ICD-10-CM | POA: Diagnosis present

## 2019-07-25 DIAGNOSIS — G8929 Other chronic pain: Secondary | ICD-10-CM | POA: Diagnosis present

## 2019-07-25 DIAGNOSIS — R410 Disorientation, unspecified: Secondary | ICD-10-CM | POA: Diagnosis present

## 2019-07-25 DIAGNOSIS — X501XXA Overexertion from prolonged static or awkward postures, initial encounter: Secondary | ICD-10-CM

## 2019-07-25 DIAGNOSIS — R627 Adult failure to thrive: Secondary | ICD-10-CM | POA: Diagnosis present

## 2019-07-25 DIAGNOSIS — Z681 Body mass index (BMI) 19 or less, adult: Secondary | ICD-10-CM

## 2019-07-25 DIAGNOSIS — R29713 NIHSS score 13: Secondary | ICD-10-CM | POA: Diagnosis not present

## 2019-07-25 DIAGNOSIS — M069 Rheumatoid arthritis, unspecified: Secondary | ICD-10-CM | POA: Diagnosis present

## 2019-07-25 DIAGNOSIS — T7691XA Unspecified adult maltreatment, suspected, initial encounter: Secondary | ICD-10-CM | POA: Diagnosis present

## 2019-07-25 DIAGNOSIS — I10 Essential (primary) hypertension: Secondary | ICD-10-CM | POA: Diagnosis present

## 2019-07-25 DIAGNOSIS — Z87891 Personal history of nicotine dependence: Secondary | ICD-10-CM

## 2019-07-25 MED ORDER — LACTATED RINGERS IV BOLUS
1000.0000 mL | Freq: Once | INTRAVENOUS | Status: AC
  Administered 2019-07-26: 02:00:00 1000 mL via INTRAVENOUS

## 2019-07-25 NOTE — ED Triage Notes (Signed)
[facility] transported pt from home and reports the following:  Pt said she got up to go to bathroom and fell in bathroom. Grandson got her up. Ribs on right side painful 10/10. No blood thinners.  Haynes Bast will pick her up.  His cell phone is broken. Call his office in the morning [phone].  If pt discharged tonight, call Ben's girlfriend Lovena Le [phone].

## 2019-07-25 NOTE — ED Provider Notes (Signed)
Emergency Department Provider Note   I have reviewed the triage vital signs and the nursing notes.   HISTORY  Chief Complaint Fall and Rib Injury   HPI Angelica Bell is a 85 y.o. female with multiple medical problems documented below who presents to the emergency department today after multiple falls.  Patient states that she has been eating drinking recently because she is not been making her food.  She states that she is fallen multiple times secondary to weakness and being hungry.  She states that she has been having less urination but normal bowel movements.  States that she did not pass out.  States that she does not hit her head.  She has pain all over seems to be mostly concentrated in her right shoulder right humerus left shoulder, bilateral knees and her left femur.  No recent illnesses.   No other associated or modifying symptoms.    Past Medical History:  Diagnosis Date  . Chronic back pain    with degenerative joint disease  . Chronic pain disorder    with narcotic management  . Dry eyes   . Hypertension   . Osteoporosis   . PE (pulmonary embolism)    After hysterectomy in 1967  . Pneumonia     Patient Active Problem List   Diagnosis Date Noted  . Peripheral neuropathy   . Drug overdose   . Polypharmacy   . Altered mental status 10/25/2017  . Closed displaced intertrochanteric fracture of left femur ([place]) 02/16/2017  . Hip fracture, right ([place]) 12/25/2015  . Chronic pain 12/24/2015  . Fracture of femoral neck, right, closed ([place]) 12/24/2015  . Essential hypertension 12/24/2015  . Community acquired pneumonia 04/15/2013  . Hypokalemia 04/15/2013  . Labial cyst 02/08/2012  . POSTMENOPAUSAL SYNDROME 11/28/2009  . PERIPHERAL NEUROPATHY, LOWER EXTREMITY, RIGHT 02/22/2009  . INSOMNIA-SLEEP DISORDER-UNSPEC 10/12/2008  . Lumbago 08/02/2008  . 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 ANXIETY DEPRESSION 12/16/2007  . HYPERLIPIDEMIA 07/08/2007  .  Osteoarthritis 07/08/2007  . Osteoporosis 07/08/2007  . SCOLIOSIS NEC 07/08/2007    Past Surgical History:  Procedure Laterality Date  . ABDOMINAL HYSTERECTOMY    . APPENDECTOMY    . BREAST ENHANCEMENT SURGERY    . FEMUR IM NAIL Left 02/17/2017   Procedure: INTRAMEDULLARY (IM) NAIL FEMORAL;  Surgeon: Nicholes Stairs, MD;  Location: [facility];  Service: Orthopedics;  Laterality: Left;  . HIP ARTHROPLASTY Right 12/24/2015   Procedure: ARTHROPLASTY BIPOLAR HIP (HEMIARTHROPLASTY);  Surgeon: Netta Cedars, MD;  Location[facility];  Service: Orthopedics;  Laterality: Right;  . TONSILLECTOMY      Current Outpatient Rx  . Order #: [id number] Class: No Print  . Order #: [id number] Class: Normal    Allergies Hctz [hydrochlorothiazide], Amoxicillin-pot clavulanate, Lisinopril-hydrochlorothiazide, Sulfa antibiotics, and Sulfonamide derivatives  Family History  Problem Relation Age of Onset  . Heart Problems Mother     Social History Social History   Tobacco Use  . Smoking status: Former Smoker    Years: 13.00    Quit date: 04/15/1976    Years since quitting: 43.3  . Smokeless tobacco: Never Used  Substance Use Topics  . Alcohol use: No  . Drug use: No    Review of Systems  All other systems negative except as documented in the HPI. All pertinent positives and negatives as reviewed in the HPI. ____________________________________________   PHYSICAL EXAM:  VITAL SIGNS: ED Triage Vitals  Enc Vitals Group     BP 07/25/19 2301 122/62  Pulse Rate 07/25/19 2301 85     Resp --      Temp 07/25/19 2301 98.3 F (36.8 C)     Temp Source 07/25/19 2301 Oral     SpO2 07/25/19 2254 91 %     Weight 07/25/19 2258 123 lb (55.8 kg)     Height 07/25/19 2258 5\' 6"  (1.676 m)    Constitutional: Alert and oriented. Well appearing and in no acute distress. Eyes: eyes sunken. Conjunctivae are normal. PERRL. EOMI. Head: Atraumatic. Nose: No congestion/rhinnorhea. Mouth/Throat: Mucous  membranes are moist.  Oropharynx non-erythematous. Neck: No stridor.  No meningeal signs.   Cardiovascular: Normal rate, regular rhythm. Good peripheral circulation. Grossly normal heart sounds.   Respiratory: Normal respiratory effort.  No retractions. Lungs CTAB. Gastrointestinal: Soft and nontender. No distention.  Musculoskeletal: Tenderness to palpation of her right wrist with associated erythema and edema, skin tear/abrasion to right shoulder without deformity, pain with range of motion of left shoulder, midsternal chest tenderness, right hip tenderness, left proximal femur tenderness. Neurologic:  Normal speech and language. No gross focal neurologic deficits are appreciated.  Skin:  Skin is warm, dry and intact. No rash noted.   ____________________________________________   LABS (all labs ordered are listed, but only abnormal results are displayed)  Labs Reviewed  CBC WITH DIFFERENTIAL/PLATELET  COMPREHENSIVE METABOLIC PANEL  URINALYSIS, ROUTINE W REFLEX MICROSCOPIC   ____________________________________________  EKG   EKG Interpretation  Date/Time:    Ventricular Rate:    PR Interval:    QRS Duration:   QT Interval:    QTC Calculation:   R Axis:     Text Interpretation:         ____________________________________________  RADIOLOGY  No results found.  ____________________________________________   PROCEDURES  Procedure(s) performed:   .Critical Care Performed by: Merrily Pew, MD Authorized by: Merrily Pew, MD   Critical care provider statement:    Critical care time (minutes):  45   Critical care was necessary to treat or prevent imminent or life-threatening deterioration of the following conditions:  Dehydration and metabolic crisis   Critical care was time spent personally by me on the following activities:  Discussions with consultants, evaluation of patient's response to treatment, examination of patient, ordering and performing treatments  and interventions, ordering and review of laboratory studies, ordering and review of radiographic studies, pulse oximetry, re-evaluation of patient's condition, obtaining history from patient or surrogate and review of old charts     ____________________________________________   INITIAL IMPRESSION / [place] / ED COURSE  We will evaluate for bony injuries.  Patient also appears to be dehydrated will give some fluids, check urine check kidney function and attempt ambulation prior to discharge.  Patient with hypokalemia, acute kidney injury likely related to decreased intake.  Patient told the nurse that she had felt she is being cared for properly at home.  Magnesium, potassium and fluids repleted will discuss with hospitalist for admission.     Pertinent labs & imaging results that were available during my care of the patient were reviewed by me and considered in my medical decision making (see chart for details).   A medical screening exam was performed and I feel the patient has had an appropriate workup for their chief complaint at this time and likelihood of emergent condition existing is low. They have been counseled on decision, discharge, follow up and which symptoms necessitate immediate return to the emergency department. They or their family verbally stated understanding and agreement with plan  and discharged in stable condition.   ____________________________________________  FINAL CLINICAL IMPRESSION(S) / ED DIAGNOSES  Final diagnoses:  None     MEDICATIONS GIVEN DURING THIS VISIT:  Medications  lactated ringers bolus 1,000 mL (has no administration in time range)     NEW OUTPATIENT MEDICATIONS STARTED DURING THIS VISIT:  New Prescriptions   No medications on file    Note:  This note was prepared with assistance of Dragon voice recognition software. Occasional wrong-word or sound-a-like substitutions may have occurred due to the inherent limitations of  voice recognition software.   Georgian Mcclory, Corene Cornea, MD 07/26/19 [phone]

## 2019-07-26 ENCOUNTER — Inpatient Hospital Stay (HOSPITAL_COMMUNITY): Payer: Medicare Other

## 2019-07-26 ENCOUNTER — Encounter (HOSPITAL_COMMUNITY): Payer: Self-pay | Admitting: Internal Medicine

## 2019-07-26 DIAGNOSIS — R945 Abnormal results of liver function studies: Secondary | ICD-10-CM | POA: Diagnosis not present

## 2019-07-26 DIAGNOSIS — E46 Unspecified protein-calorie malnutrition: Secondary | ICD-10-CM | POA: Diagnosis not present

## 2019-07-26 DIAGNOSIS — N179 Acute kidney failure, unspecified: Secondary | ICD-10-CM | POA: Diagnosis present

## 2019-07-26 DIAGNOSIS — I6389 Other cerebral infarction: Secondary | ICD-10-CM | POA: Diagnosis not present

## 2019-07-26 DIAGNOSIS — Z882 Allergy status to sulfonamides status: Secondary | ICD-10-CM | POA: Diagnosis not present

## 2019-07-26 DIAGNOSIS — E871 Hypo-osmolality and hyponatremia: Secondary | ICD-10-CM | POA: Diagnosis present

## 2019-07-26 DIAGNOSIS — M1990 Unspecified osteoarthritis, unspecified site: Secondary | ICD-10-CM | POA: Diagnosis present

## 2019-07-26 DIAGNOSIS — X501XXA Overexertion from prolonged static or awkward postures, initial encounter: Secondary | ICD-10-CM | POA: Diagnosis not present

## 2019-07-26 DIAGNOSIS — I10 Essential (primary) hypertension: Secondary | ICD-10-CM

## 2019-07-26 DIAGNOSIS — Z681 Body mass index (BMI) 19 or less, adult: Secondary | ICD-10-CM | POA: Diagnosis not present

## 2019-07-26 DIAGNOSIS — T7691XA Unspecified adult maltreatment, suspected, initial encounter: Secondary | ICD-10-CM | POA: Diagnosis present

## 2019-07-26 DIAGNOSIS — Z888 Allergy status to other drugs, medicaments and biological substances status: Secondary | ICD-10-CM | POA: Diagnosis not present

## 2019-07-26 DIAGNOSIS — G8929 Other chronic pain: Secondary | ICD-10-CM | POA: Diagnosis present

## 2019-07-26 DIAGNOSIS — M810 Age-related osteoporosis without current pathological fracture: Secondary | ICD-10-CM | POA: Diagnosis present

## 2019-07-26 DIAGNOSIS — R296 Repeated falls: Secondary | ICD-10-CM | POA: Diagnosis present

## 2019-07-26 DIAGNOSIS — E872 Acidosis: Secondary | ICD-10-CM | POA: Diagnosis not present

## 2019-07-26 DIAGNOSIS — Z96641 Presence of right artificial hip joint: Secondary | ICD-10-CM | POA: Diagnosis present

## 2019-07-26 DIAGNOSIS — M419 Scoliosis, unspecified: Secondary | ICD-10-CM | POA: Diagnosis present

## 2019-07-26 DIAGNOSIS — T7611XA Adult physical abuse, suspected, initial encounter: Secondary | ICD-10-CM | POA: Diagnosis present

## 2019-07-26 DIAGNOSIS — I6340 Cerebral infarction due to embolism of unspecified cerebral artery: Secondary | ICD-10-CM | POA: Diagnosis not present

## 2019-07-26 DIAGNOSIS — Z881 Allergy status to other antibiotic agents status: Secondary | ICD-10-CM | POA: Diagnosis not present

## 2019-07-26 DIAGNOSIS — G629 Polyneuropathy, unspecified: Secondary | ICD-10-CM | POA: Diagnosis present

## 2019-07-26 DIAGNOSIS — E785 Hyperlipidemia, unspecified: Secondary | ICD-10-CM | POA: Diagnosis present

## 2019-07-26 DIAGNOSIS — E876 Hypokalemia: Secondary | ICD-10-CM

## 2019-07-26 DIAGNOSIS — Z86711 Personal history of pulmonary embolism: Secondary | ICD-10-CM | POA: Diagnosis not present

## 2019-07-26 DIAGNOSIS — Z20828 Contact with and (suspected) exposure to other viral communicable diseases: Secondary | ICD-10-CM | POA: Diagnosis present

## 2019-07-26 DIAGNOSIS — I639 Cerebral infarction, unspecified: Secondary | ICD-10-CM | POA: Diagnosis not present

## 2019-07-26 DIAGNOSIS — N3000 Acute cystitis without hematuria: Secondary | ICD-10-CM | POA: Diagnosis not present

## 2019-07-26 DIAGNOSIS — R0781 Pleurodynia: Secondary | ICD-10-CM | POA: Diagnosis present

## 2019-07-26 LAB — COMPREHENSIVE METABOLIC PANEL
ALT: 90 U/L — ABNORMAL HIGH (ref 0–44)
ALT: 69 U/L — ABNORMAL HIGH (ref 0–44)
AST: 150 U/L — ABNORMAL HIGH (ref 15–41)
AST: 109 U/L — ABNORMAL HIGH (ref 15–41)
Albumin: 3.4 g/dL — ABNORMAL LOW (ref 3.5–5.0)
Albumin: 2.7 g/dL — ABNORMAL LOW (ref 3.5–5.0)
Alkaline Phosphatase: 85 U/L (ref 38–126)
Alkaline Phosphatase: 68 U/L (ref 38–126)
Anion gap: 15 (ref 5–15)
Anion gap: 13 (ref 5–15)
BUN: 61 mg/dL — ABNORMAL HIGH (ref 8–23)
BUN: 55 mg/dL — ABNORMAL HIGH (ref 8–23)
CO2: 24 mmol/L (ref 22–32)
CO2: 22 mmol/L (ref 22–32)
Calcium: 8.8 mg/dL — ABNORMAL LOW (ref 8.9–10.3)
Calcium: 8 mg/dL — ABNORMAL LOW (ref 8.9–10.3)
Chloride: 95 mmol/L — ABNORMAL LOW (ref 98–111)
Chloride: 101 mmol/L (ref 98–111)
Creatinine, Ser: 2.76 mg/dL — ABNORMAL HIGH (ref 0.44–1.00)
Creatinine, Ser: 2.23 mg/dL — ABNORMAL HIGH (ref 0.44–1.00)
GFR calc Af Amer: 17 mL/min — ABNORMAL LOW (ref >60)
GFR calc Af Amer: 23 mL/min — ABNORMAL LOW (ref >60)
GFR calc non Af Amer: 15 mL/min — ABNORMAL LOW (ref >60)
GFR calc non Af Amer: 19 mL/min — ABNORMAL LOW (ref >60)
Glucose, Bld: 119 mg/dL — ABNORMAL HIGH (ref 70–99)
Glucose, Bld: 111 mg/dL — ABNORMAL HIGH (ref 70–99)
Potassium: 2.4 mmol/L — CL (ref 3.5–5.1)
Potassium: 3 mmol/L — ABNORMAL LOW (ref 3.5–5.1)
Sodium: 134 mmol/L — ABNORMAL LOW (ref 135–145)
Sodium: 136 mmol/L (ref 135–145)
Total Bilirubin: 0.7 mg/dL (ref 0.3–1.2)
Total Bilirubin: 0.5 mg/dL (ref 0.3–1.2)
Total Protein: 6.6 g/dL (ref 6.5–8.1)
Total Protein: 5.3 g/dL — ABNORMAL LOW (ref 6.5–8.1)

## 2019-07-26 LAB — CBC WITH DIFFERENTIAL/PLATELET
Abs Immature Granulocytes: 0.07 10*3/uL (ref 0.00–0.07)
Basophils Absolute: 0 10*3/uL (ref 0.0–0.1)
Basophils Relative: 0 %
Eosinophils Absolute: 0 10*3/uL (ref 0.0–0.5)
Eosinophils Relative: 0 %
HCT: 42.3 % (ref 36.0–46.0)
Hemoglobin: 13.8 g/dL (ref 12.0–15.0)
Immature Granulocytes: 0 %
Lymphocytes Relative: 13 %
Lymphs Abs: 2.2 10*3/uL (ref 0.7–4.0)
MCH: 27.7 pg (ref 26.0–34.0)
MCHC: 32.6 g/dL (ref 30.0–36.0)
MCV: 84.9 fL (ref 80.0–100.0)
Monocytes Absolute: 1.1 10*3/uL — ABNORMAL HIGH (ref 0.1–1.0)
Monocytes Relative: 7 %
Neutro Abs: 12.9 10*3/uL — ABNORMAL HIGH (ref 1.7–7.7)
Neutrophils Relative %: 80 %
Platelets: 268 10*3/uL (ref 150–400)
RBC: 4.98 MIL/uL (ref 3.87–5.11)
RDW: 13.1 % (ref 11.5–15.5)
WBC: 16.3 10*3/uL — ABNORMAL HIGH (ref 4.0–10.5)
nRBC: 0 % (ref 0.0–0.2)

## 2019-07-26 LAB — URINALYSIS, ROUTINE W REFLEX MICROSCOPIC
Bilirubin Urine: NEGATIVE
Glucose, UA: NEGATIVE mg/dL
Ketones, ur: 5 mg/dL — AB
Leukocytes,Ua: NEGATIVE
Nitrite: NEGATIVE
Protein, ur: 30 mg/dL — AB
Specific Gravity, Urine: 1.016 (ref 1.005–1.030)
pH: 5 (ref 5.0–8.0)

## 2019-07-26 LAB — MAGNESIUM: Magnesium: 2.1 mg/dL (ref 1.7–2.4)

## 2019-07-26 LAB — CK TOTAL AND CKMB (NOT AT ARMC)
CK, MB: 34.6 ng/mL — ABNORMAL HIGH (ref 0.5–5.0)
Relative Index: 1.3 (ref 0.0–2.5)
Total CK: 2584 U/L — ABNORMAL HIGH (ref 38–234)

## 2019-07-26 LAB — SARS CORONAVIRUS 2 BY RT PCR (HOSPITAL ORDER, PERFORMED IN ~~LOC~~ HOSPITAL LAB): SARS Coronavirus 2: NEGATIVE

## 2019-07-26 MED ORDER — ENOXAPARIN SODIUM 30 MG/0.3ML ~~LOC~~ SOLN
30.0000 mg | SUBCUTANEOUS | Status: DC
  Administered 2019-07-26 – 2019-07-31 (×6): 30 mg via SUBCUTANEOUS
  Filled 2019-07-26 (×6): qty 0.3

## 2019-07-26 MED ORDER — SODIUM CHLORIDE 0.9 % IV BOLUS
1000.0000 mL | Freq: Once | INTRAVENOUS | Status: AC
  Administered 2019-07-26: 03:00:00 1000 mL via INTRAVENOUS

## 2019-07-26 MED ORDER — FENTANYL CITRATE (PF) 100 MCG/2ML IJ SOLN
50.0000 ug | Freq: Once | INTRAMUSCULAR | Status: AC
  Administered 2019-07-26: 02:00:00 50 ug via INTRAVENOUS
  Filled 2019-07-26: qty 2

## 2019-07-26 MED ORDER — ACETAMINOPHEN 325 MG PO TABS
650.0000 mg | ORAL_TABLET | Freq: Four times a day (QID) | ORAL | Status: DC | PRN
  Administered 2019-07-26 – 2019-08-04 (×6): 650 mg via ORAL
  Filled 2019-07-26 (×6): qty 2

## 2019-07-26 MED ORDER — MAGNESIUM SULFATE 2 GM/50ML IV SOLN
2.0000 g | Freq: Once | INTRAVENOUS | Status: AC
  Administered 2019-07-26: 04:00:00 2 g via INTRAVENOUS
  Filled 2019-07-26: qty 50

## 2019-07-26 MED ORDER — POTASSIUM CHLORIDE 10 MEQ/100ML IV SOLN
10.0000 meq | INTRAVENOUS | Status: AC
  Administered 2019-07-26 (×4): 10 meq via INTRAVENOUS
  Filled 2019-07-26 (×4): qty 100

## 2019-07-26 MED ORDER — POTASSIUM CHLORIDE CRYS ER 20 MEQ PO TBCR
40.0000 meq | EXTENDED_RELEASE_TABLET | Freq: Once | ORAL | Status: AC
  Administered 2019-07-26: 03:00:00 40 meq via ORAL
  Filled 2019-07-26: qty 2

## 2019-07-26 MED ORDER — ACETAMINOPHEN 650 MG RE SUPP: 650.0000 mg | Freq: Four times a day (QID) | RECTAL | Status: DC | PRN

## 2019-07-26 MED ORDER — OXYCODONE HCL 5 MG PO TABS
10.0000 mg | ORAL_TABLET | Freq: Once | ORAL | Status: AC
  Administered 2019-07-27: 10 mg via ORAL
  Filled 2019-07-26: qty 2

## 2019-07-26 MED ORDER — OXYCODONE HCL 5 MG PO TABS
30.0000 mg | ORAL_TABLET | Freq: Three times a day (TID) | ORAL | Status: DC | PRN
  Administered 2019-07-26 – 2019-08-07 (×23): 30 mg via ORAL
  Filled 2019-07-26 (×25): qty 6

## 2019-07-26 MED ORDER — POTASSIUM CHLORIDE IN NACL 20-0.9 MEQ/L-% IV SOLN
INTRAVENOUS | Status: AC
  Administered 2019-07-26 – 2019-07-27 (×3): via INTRAVENOUS
  Filled 2019-07-26 (×3): qty 1000

## 2019-07-26 MED ORDER — POTASSIUM CHLORIDE 10 MEQ/100ML IV SOLN
10.0000 meq | INTRAVENOUS | Status: AC
  Administered 2019-07-26: 03:00:00 10 meq via INTRAVENOUS
  Filled 2019-07-26 (×2): qty 100

## 2019-07-26 MED ORDER — ASPIRIN EC 81 MG PO TBEC
81.0000 mg | DELAYED_RELEASE_TABLET | Freq: Every day | ORAL | Status: DC
  Administered 2019-07-26 – 2019-08-07 (×13): 81 mg via ORAL
  Filled 2019-07-26 (×13): qty 1

## 2019-07-26 MED ORDER — POTASSIUM CHLORIDE 10 MEQ/100ML IV SOLN
10.0000 meq | Freq: Once | INTRAVENOUS | Status: AC
  Administered 2019-07-26: 10 meq via INTRAVENOUS

## 2019-07-26 NOTE — ED Notes (Signed)
ED TO INPATIENT HANDOFF REPORT  ED Nurse Name and Phone #: Judye Bos Name/Age/Gender [name] 85 y.o. female Room/Bed: WA06/WA06  Code Status   Code Status: Prior  Home/SNF/Other Home Patient oriented to: self, place, time and situation Is this baseline? Yes   Triage Complete: Triage complete  Chief Complaint Fall Rib pain  Triage Note [facility] transported pt from home and reports the following:  Pt said she got up to go to bathroom and fell in bathroom. Grandson got her up. Ribs on right side painful 10/10. No blood thinners.  Haynes Bast will pick her up.  His cell phone is broken. Call his office in the morning [phone].  If pt discharged tonight, call Ben's girlfriend Lovena Le [phone].   Allergies Allergies  Allergen Reactions  . Hctz [Hydrochlorothiazide]     Significant and rapid hyponatremia (TIH)  . Amoxicillin-Pot Clavulanate Hives and Diarrhea    Has patient had a PCN reaction causing immediate rash, facial/tongue/throat swelling, SOB or lightheadedness with hypotension: yes Has patient had a PCN reaction causing severe rash involving mucus membranes or skin necrosis: yes Has patient had a PCN reaction that required hospitalization : unknown Has patient had a PCN reaction occurring within the last 10 years: yes If all of the above answers are "NO", then may proceed with Cephalosporin use.   Marland Kitchen Lisinopril-Hydrochlorothiazide Other (See Comments)    Tired- no energy to do anything..  . Sulfa Antibiotics Hives and Nausea And Vomiting  . Sulfonamide Derivatives Hives    Level of Care/Admitting Diagnosis ED Disposition    ED Disposition Condition Comment   Admit  Hospital Area: [facility] [[id number]]  Level of Care: Telemetry [5]  Admit to tele based on following criteria: Monitor for Ischemic changes  Admit to tele based on following criteria: Other see comments  Comments: hypokalemia  Covid Evaluation: Asymptomatic Screening  Protocol (No Symptoms)  Diagnosis: ARF (acute renal failure) [facility]) [[id number]]  Admitting Physician: Jani Gravel [[id number]]  Attending Physician: Jani Gravel [phone]  Estimated length of stay: past midnight tomorrow  Certification:: I certify this patient will need inpatient services for at least 2 midnights  PT Class (Do Not Modify): Inpatient [101]  PT Acc Code (Do Not Modify): Private [1]       B Medical/Surgery History Past Medical History:  Diagnosis Date  . Chronic back pain    with degenerative joint disease  . Chronic pain disorder    with narcotic management  . Dry eyes   . Hypertension   . Osteoporosis   . PE (pulmonary embolism)    After hysterectomy in 1967  . Pneumonia    Past Surgical History:  Procedure Laterality Date  . ABDOMINAL HYSTERECTOMY    . APPENDECTOMY    . BREAST ENHANCEMENT SURGERY    . FEMUR IM NAIL Left 02/17/2017   Procedure: INTRAMEDULLARY (IM) NAIL FEMORAL;  Surgeon: Nicholes Stairs, MD;  Location: [facility];  Service: Orthopedics;  Laterality: Left;  . HIP ARTHROPLASTY Right 12/24/2015   Procedure: ARTHROPLASTY BIPOLAR HIP (HEMIARTHROPLASTY);  Surgeon: Netta Cedars, MD;  [facility];  Service: Orthopedics;  Laterality: Right;  . TONSILLECTOMY       A IV Location/Drains/Wounds Patient Lines/Drains/Airways Status   Active Line/Drains/Airways    Name:   Placement date:   Placement time:   Site:   Days:   Peripheral IV 07/26/19 Right Forearm   07/26/19    0148    Forearm   less than 1  External Urinary Catheter   07/26/19    0110    -   less than 1   Incision (Closed) 12/24/15 Hip Right   12/24/15    2333     1310   Incision (Closed) 02/17/17 Hip Left   02/17/17    1324     889          Intake/Output Last 24 hours  Intake/Output Summary (Last 24 hours) at 07/26/2019 0356 Last data filed at 07/26/2019 0243 Gross per 24 hour  Intake 990 ml  Output 400 ml  Net 590 ml    Labs/Imaging Results for orders placed or performed during the  hospital encounter of 07/25/19 (from the past 48 hour(s))  CBC with Differential     Status: Abnormal   Collection Time: 07/25/19 11:38 PM  Result Value Ref Range   WBC 16.3 (H) 4.0 - 10.5 K/uL   RBC 4.98 3.87 - 5.11 MIL/uL   Hemoglobin 13.8 12.0 - 15.0 g/dL   HCT 42.3 36.0 - 46.0 %   MCV 84.9 80.0 - 100.0 fL   MCH 27.7 26.0 - 34.0 pg   MCHC 32.6 30.0 - 36.0 g/dL   RDW 13.1 11.5 - 15.5 %   Platelets 268 150 - 400 K/uL   nRBC 0.0 0.0 - 0.2 %   Neutrophils Relative % 80 %   Neutro Abs 12.9 (H) 1.7 - 7.7 K/uL   Lymphocytes Relative 13 %   Lymphs Abs 2.2 0.7 - 4.0 K/uL   Monocytes Relative 7 %   Monocytes Absolute 1.1 (H) 0.1 - 1.0 K/uL   Eosinophils Relative 0 %   Eosinophils Absolute 0.0 0.0 - 0.5 K/uL   Basophils Relative 0 %   Basophils Absolute 0.0 0.0 - 0.1 K/uL   Immature Granulocytes 0 %   Abs Immature Granulocytes 0.07 0.00 - 0.07 K/uL    Comment: Performed at [facility], [place] [address] [place], [place] [address]  Comprehensive metabolic panel     Status: Abnormal   Collection Time: 07/25/19 11:38 PM  Result Value Ref Range   Sodium 134 (L) 135 - 145 mmol/L   Potassium 2.4 (LL) 3.5 - 5.1 mmol/L    Comment: CRITICAL RESULT CALLED TO, READ BACK BY AND VERIFIED WITH: HODGES AT 0220 ON 07/26/2019 BY JPM    Chloride 95 (L) 98 - 111 mmol/L   CO2 24 22 - 32 mmol/L   Glucose, Bld 119 (H) 70 - 99 mg/dL   BUN 61 (H) 8 - 23 mg/dL   Creatinine, Ser 2.76 (H) 0.44 - 1.00 mg/dL   Calcium 8.8 (L) 8.9 - 10.3 mg/dL   Total Protein 6.6 6.5 - 8.1 g/dL   Albumin 3.4 (L) 3.5 - 5.0 g/dL   AST 150 (H) 15 - 41 U/L   ALT 90 (H) 0 - 44 U/L   Alkaline Phosphatase 85 38 - 126 U/L   Total Bilirubin 0.7 0.3 - 1.2 mg/dL   GFR calc non Af Amer 15 (L) >60 mL/min   GFR calc Af Amer 17 (L) >60 mL/min   Anion gap 15 5 - 15    Comment: Performed at [facility], [place] [address], [place], [place] [address]  Magnesium     Status: None   Collection Time:  07/25/19 11:38 PM  Result Value Ref Range   Magnesium 2.1 1.7 - 2.4 mg/dL    Comment: Performed at [facility], [place] [address] [place], [place] [address]  Urinalysis, Routine w reflex microscopic     Status: Abnormal   Collection Time: 07/26/19  2:20 AM  Result Value Ref Range   Color, Urine YELLOW YELLOW   APPearance CLEAR CLEAR   Specific Gravity, Urine 1.016 1.005 - 1.030   pH 5.0 5.0 - 8.0   Glucose, UA NEGATIVE NEGATIVE mg/dL   Hgb urine dipstick LARGE (A) NEGATIVE   Bilirubin Urine NEGATIVE NEGATIVE   Ketones, ur 5 (A) NEGATIVE mg/dL   Protein, ur 30 (A) NEGATIVE mg/dL   Nitrite NEGATIVE NEGATIVE   Leukocytes,Ua NEGATIVE NEGATIVE   RBC / HPF 0-5 0 - 5 RBC/hpf   WBC, UA 0-5 0 - 5 WBC/hpf   Bacteria, UA RARE (A) NONE SEEN   Hyaline Casts, UA PRESENT    Granular Casts, UA PRESENT     Comment: Performed at [facility], [place] [address] [place], [place] [address]   Dg Chest 2 View  Result Date: 07/26/2019 CLINICAL DATA:  Fall EXAM: CHEST - 2 VIEW COMPARISON:  04/22/2018 FINDINGS: Lungs are clear.  No pleural effusion or pneumothorax. The heart is normal in size. S shaped thoracolumbar scoliosis. Posttraumatic deformity involving the left humeral head/neck, chronic. IMPRESSION: No evidence of acute cardiopulmonary disease. Electronically Signed   By: Julian Hy M.D.   On: 07/26/2019 01:00   Dg Pelvis 1-2 Views  Result Date: 07/26/2019 CLINICAL DATA:  Fall, bilateral leg pain EXAM: PELVIS - 1-2 VIEW COMPARISON:  CT pelvis dated 09/10/2016 FINDINGS: No fracture or dislocation is seen. Visualized bony pelvis appears intact. Right hip arthroplasty. Status post ORIF of a prior intertrochanteric left hip fracture. Degenerative changes the lower lumbar spine. IMPRESSION: Status post right hip arthroplasty. Status post ORIF of a prior intertrochanteric left hip fracture. No acute fracture is seen. Electronically Signed   By: Julian Hy M.D.    On: 07/26/2019 01:01   Dg Wrist Complete Right  Result Date: 07/26/2019 CLINICAL DATA:  Fall, right wrist pain EXAM: RIGHT WRIST - COMPLETE 3+ VIEW COMPARISON:  None. FINDINGS: No fracture or dislocation is seen. The joint spaces are preserved. Visualized soft tissues are within normal limits. IMPRESSION: Negative. Electronically Signed   By: Julian Hy M.D.   On: 07/26/2019 00:59   Dg Femur Min 2 Views Left  Result Date: 07/26/2019 CLINICAL DATA:  Fall, bilateral leg pain EXAM: LEFT FEMUR 2 VIEWS COMPARISON:  Intraoperative left femur radiographs dated 02/17/2017 FINDINGS: Status post ORIF of an intertrochanteric left hip fracture. No evidence of hardware loosening or complication. No evidence of acute fracture or dislocation. Left hip joint space is preserved. Distal left femur is unremarkable. IMPRESSION: Status post ORIF of an intertrochanteric left hip fracture, as above, without evidence of complication. No evidence of acute fracture or dislocation. Electronically Signed   By: Julian Hy M.D.   On: 07/26/2019 01:03   Dg Femur Min 2 Views Right  Result Date: 07/26/2019 CLINICAL DATA:  Fall, bilateral leg pain EXAM: RIGHT FEMUR 2 VIEWS COMPARISON:  None. FINDINGS: Status post right hip hemiarthroplasty. No acetabular component. No evidence of complication. No evidence of fracture or dislocation. Distal femur is unremarkable. IMPRESSION: Status post right hip hemiarthroplasty, without evidence of complication. Electronically Signed   By: Julian Hy M.D.   On: 07/26/2019 01:02    Pending Labs Unresulted Labs (From admission, onward)    Start     Ordered   07/26/19 0302  SARS Coronavirus 2 (CEPHEID - Performed in [facility] hospital lab), [place]  (  Asymptomatic Patients Labs)  Once,   STAT    Question:  Rule Out  Answer:  Yes   07/26/19 0302          Vitals/Pain Today's Vitals   07/26/19 0300 07/26/19 0322 07/26/19 0330 07/26/19 0337  BP: (!) 141/73  (!)  103/54   Pulse:    76  Resp: 16  14 15   Temp:      TempSrc:      SpO2:    95%  Weight:      Height:      PainSc:  9       Isolation Precautions No active isolations  Medications Medications  magnesium sulfate IVPB 2 g 50 mL (has no administration in time range)  potassium chloride 10 mEq in 100 mL IVPB (10 mEq Intravenous New Bag/Given 07/26/19 0257)  lactated ringers bolus 1,000 mL (0 mLs Intravenous Stopped 07/26/19 0243)  fentaNYL (SUBLIMAZE) injection 50 mcg (50 mcg Intravenous Given 07/26/19 0150)  sodium chloride 0.9 % bolus 1,000 mL (1,000 mLs Intravenous New Bag/Given 07/26/19 0255)  potassium chloride SA (K-DUR) CR tablet 40 mEq (40 mEq Oral Given 07/26/19 0244)    Mobility walks with person assist High fall risk   Focused Assessments NA   R Recommendations: See Admitting Provider Note  Report given to:   Additional Notes: NA

## 2019-07-26 NOTE — H&P (Addendum)
TRH H&P    Patient Demographics:    Angelica Bell, is a 85 y.o. female  MRN: [id number]  DOB - [birth year]  Admit Date - 07/25/2019  Referring MD/NP/PA: Merrily Pew  Outpatient Primary MD for the patient is Heywood Bene, PA-C  Patient coming from:  home  Chief complaint- ARF, hypokalemia   HPI:    Angelica Bell  is a 85 y.o. female,w hypertension, ? Rheumatoid arthritis (found in epic [facility]), osteoporosis, scoliosis,  chronic back pain, h/o PE (?? In our medical records in [facility] but can't find CTA chest or in Epic everywhere)  , h/o cellulitis, presents with c/o failure to thrive, recurrent falls at home.  Pt states that her poor po intake is due to not being able to get food ?  In ED,   T 98.3, P 85 R 16 Bp 122/62  Pox 95% RA Wt 55.8kg   Right wrist xray-> negative Xray pelvis -> no fracture or dislocation, s/p prior R hip arthroplasty Xray right and left femur negative for fracture or dislocation  CXR -> No active cardiopulmonary disease  Wbc 16.3, Hgb 13.8, Plt 268 Na 134, K 2.4, Bun 61, Creatinine 2.76 Alb 3.4 Ast 150, Alt 90, Alk phos 85, T. Bili 0.7 Glucose 119  Magnesium 2.1,   Urinalysis + ketones, + prot 30  covid -19 negative  Pt will be admitted for ARF, abnormal liver function. And severe hypokalemia.     Review of systems:    In addition to the HPI above,  No Fever-chills, No Headache, No changes with Vision or hearing, No problems swallowing food or Liquids, No Chest pain, Cough or Shortness of Breath, No Abdominal pain, No Nausea or Vomiting, bowel movements are regular, No Blood in stool or Urine, No dysuria, No new skin rashes or bruises, No new joints pains-aches,  No new weakness, tingling, numbness in any extremity, No recent weight gain or loss, No polyuria, polydypsia or polyphagia, No significant Mental Stressors.  All other systems reviewed and  are negative.    Past History of the following :    Past Medical History:  Diagnosis Date   Chronic back pain    with degenerative joint disease   Chronic pain disorder    with narcotic management   Dry eyes    Hypertension    Osteoporosis    PE (pulmonary embolism)    After hysterectomy in 1967   Pneumonia       Past Surgical History:  Procedure Laterality Date   ABDOMINAL HYSTERECTOMY     APPENDECTOMY     BREAST ENHANCEMENT SURGERY     FEMUR IM NAIL Left 02/17/2017   Procedure: INTRAMEDULLARY (IM) NAIL FEMORAL;  Surgeon: Nicholes Stairs, MD;  Location: [facility];  Service: Orthopedics;  Laterality: Left;   HIP ARTHROPLASTY Right 12/24/2015   Procedure: ARTHROPLASTY BIPOLAR HIP (HEMIARTHROPLASTY);  Surgeon: Netta Cedars, MD;  Location: [facility];  Service: Orthopedics;  Laterality: Right;   TONSILLECTOMY        Social History:  Social History   Tobacco Use   Smoking status: Former Smoker    Years: 13.00    Quit date: 04/15/1976    Years since quitting: 43.3   Smokeless tobacco: Never Used  Substance Use Topics   Alcohol use: No       Family History :     Family History  Problem Relation Age of Onset   Heart Problems Mother        Home Medications:   Prior to Admission medications   Medication Sig Start Date End Date Taking? Authorizing Provider  aspirin EC 81 MG tablet Take 1 tablet (81 mg total) by mouth 2 (two) times daily. Patient taking differently: Take 81 mg by mouth daily.  02/19/17  Yes Bonnielee Haff, MD  oxycodone (ROXICODONE) 30 MG immediate release tablet Take 30 mg by mouth 3 (three) times daily as needed for pain.  07/20/19  Yes [provider]     Allergies:     Allergies  Allergen Reactions   Hctz [Hydrochlorothiazide]     Significant and rapid hyponatremia (TIH)   Amoxicillin-Pot Clavulanate Hives and Diarrhea    Has patient had a PCN reaction causing immediate rash, facial/tongue/throat swelling,  SOB or lightheadedness with hypotension: yes Has patient had a PCN reaction causing severe rash involving mucus membranes or skin necrosis: yes Has patient had a PCN reaction that required hospitalization : unknown Has patient had a PCN reaction occurring within the last 10 years: yes If all of the above answers are "NO", then may proceed with Cephalosporin use.    Lisinopril-Hydrochlorothiazide Other (See Comments)    Tired- no energy to do anything..   Sulfa Antibiotics Hives and Nausea And Vomiting   Sulfonamide Derivatives Hives     Physical Exam:   Vitals  Blood pressure 120/72, pulse 78, temperature 98.3 F (36.8 C), temperature source Oral, resp. rate 11, height _0  (1.676 m), weight 55.8 kg, SpO2 97 %.  1.  General: axoxo3  2. Psychiatric: euthymic  3. Neurologic: cn2-12 intact, reflexes 2+ symmetric, diffuse with no clonus, motor 5/5 in all 4 ext  4. HEENMT:  Anicteric, pupils 1.18m symmetric, direct, consensual, near intact Mucous membranes dry Neck: no jvd  5. Respiratory : CTAB  6. Cardiovascular : rrr s1, s2, no m/g/r  7. Gastrointestinal:  Abd: soft, nt, nd, +bs  8. Skin:  Ext: no c/c/e,  No rash  9.Musculoskeletal:  Good ROM,   No adenopathy    Data Review:    CBC Recent Labs  Lab 07/25/19 2338  WBC 16.3*  HGB 13.8  HCT 42.3  PLT 268  MCV 84.9  MCH 27.7  MCHC 32.6  RDW 13.1  LYMPHSABS 2.2  MONOABS 1.1*  EOSABS 0.0  BASOSABS 0.0   ------------------------------------------------------------------------------------------------------------------  Results for orders placed or performed during the hospital encounter of 07/25/19 (from the past 48 hour(s))  CBC with Differential     Status: Abnormal   Collection Time: 07/25/19 11:38 PM  Result Value Ref Range   WBC 16.3 (H) 4.0 - 10.5 K/uL   RBC 4.98 3.87 - 5.11 MIL/uL   Hemoglobin 13.8 12.0 - 15.0 g/dL   HCT 42.3 36.0 - 46.0 %   MCV 84.9 80.0 - 100.0 fL   MCH 27.7 26.0 -  34.0 pg   MCHC 32.6 30.0 - 36.0 g/dL   RDW 13.1 11.5 - 15.5 %   Platelets 268 150 - 400 K/uL   nRBC 0.0 0.0 - 0.2 %   Neutrophils  Relative % 80 %   Neutro Abs 12.9 (H) 1.7 - 7.7 K/uL   Lymphocytes Relative 13 %   Lymphs Abs 2.2 0.7 - 4.0 K/uL   Monocytes Relative 7 %   Monocytes Absolute 1.1 (H) 0.1 - 1.0 K/uL   Eosinophils Relative 0 %   Eosinophils Absolute 0.0 0.0 - 0.5 K/uL   Basophils Relative 0 %   Basophils Absolute 0.0 0.0 - 0.1 K/uL   Immature Granulocytes 0 %   Abs Immature Granulocytes 0.07 0.00 - 0.07 K/uL    Comment: Performed at [facility], [place] [address], [place], [place] [address]  Comprehensive metabolic panel     Status: Abnormal   Collection Time: 07/25/19 11:38 PM  Result Value Ref Range   Sodium 134 (L) 135 - 145 mmol/L   Potassium 2.4 (LL) 3.5 - 5.1 mmol/L    Comment: CRITICAL RESULT CALLED TO, READ BACK BY AND VERIFIED WITH: HODGES AT 0220 ON 07/26/2019 BY JPM    Chloride 95 (L) 98 - 111 mmol/L   CO2 24 22 - 32 mmol/L   Glucose, Bld 119 (H) 70 - 99 mg/dL   BUN 61 (H) 8 - 23 mg/dL   Creatinine, Ser 2.76 (H) 0.44 - 1.00 mg/dL   Calcium 8.8 (L) 8.9 - 10.3 mg/dL   Total Protein 6.6 6.5 - 8.1 g/dL   Albumin 3.4 (L) 3.5 - 5.0 g/dL   AST 150 (H) 15 - 41 U/L   ALT 90 (H) 0 - 44 U/L   Alkaline Phosphatase 85 38 - 126 U/L   Total Bilirubin 0.7 0.3 - 1.2 mg/dL   GFR calc non Af Amer 15 (L) >60 mL/min   GFR calc Af Amer 17 (L) >60 mL/min   Anion gap 15 5 - 15    Comment: Performed at [facility], [place] [address] [place], [place] [address]  Magnesium     Status: None   Collection Time: 07/25/19 11:38 PM  Result Value Ref Range   Magnesium 2.1 1.7 - 2.4 mg/dL    Comment: Performed at [facility], [place] [address] [place], [place] [address]  Urinalysis, Routine w reflex microscopic     Status: Abnormal   Collection Time: 07/26/19  2:20 AM  Result Value Ref Range   Color, Urine YELLOW YELLOW    APPearance CLEAR CLEAR   Specific Gravity, Urine 1.016 1.005 - 1.030   pH 5.0 5.0 - 8.0   Glucose, UA NEGATIVE NEGATIVE mg/dL   Hgb urine dipstick LARGE (A) NEGATIVE   Bilirubin Urine NEGATIVE NEGATIVE   Ketones, ur 5 (A) NEGATIVE mg/dL   Protein, ur 30 (A) NEGATIVE mg/dL   Nitrite NEGATIVE NEGATIVE   Leukocytes,Ua NEGATIVE NEGATIVE   RBC / HPF 0-5 0 - 5 RBC/hpf   WBC, UA 0-5 0 - 5 WBC/hpf   Bacteria, UA RARE (A) NONE SEEN   Hyaline Casts, UA PRESENT    Granular Casts, UA PRESENT     Comment: Performed at [facility], [address] [place], [place] [address]    Chemistries  Recent Labs  Lab 07/25/19 2338  NA 134*  K 2.4*  CL 95*  CO2 24  GLUCOSE 119*  BUN 61*  CREATININE 2.76*  CALCIUM 8.8*  MG 2.1  AST 150*  ALT 90*  ALKPHOS 85  BILITOT 0.7   ------------------------------------------------------------------------------------------------------------------  ------------------------------------------------------------------------------------------------------------------ GFR: Estimated Creatinine Clearance: 13.1 mL/min (A) (by C-G formula based on SCr of 2.76 mg/dL (H)).  Liver Function Tests: Recent Labs  Lab 07/25/19 2338  AST 150*  ALT 90*  ALKPHOS 85  BILITOT 0.7  PROT 6.6  ALBUMIN 3.4*   No results for input(s): LIPASE, AMYLASE in the last 168 hours. No results for input(s): AMMONIA in the last 168 hours. Coagulation Profile: No results for input(s): INR, PROTIME in the last 168 hours. Cardiac Enzymes: No results for input(s): CKTOTAL, CKMB, CKMBINDEX, TROPONINI in the last 168 hours. BNP (last 3 results) No results for input(s): PROBNP in the last 8760 hours. HbA1C: No results for input(s): HGBA1C in the last 72 hours. CBG: No results for input(s): GLUCAP in the last 168 hours. Lipid Profile: No results for input(s): CHOL, HDL, LDLCALC, TRIG, CHOLHDL, LDLDIRECT in the last 72 hours. Thyroid Function Tests: No results for  input(s): TSH, T4TOTAL, FREET4, T3FREE, THYROIDAB in the last 72 hours. Anemia Panel: No results for input(s): VITAMINB12, FOLATE, FERRITIN, TIBC, IRON, RETICCTPCT in the last 72 hours.  --------------------------------------------------------------------------------------------------------------- Urine analysis:    Component Value Date/Time   COLORURINE YELLOW 07/26/2019 0220   APPEARANCEUR CLEAR 07/26/2019 0220   LABSPEC 1.016 07/26/2019 0220   PHURINE 5.0 07/26/2019 0220   GLUCOSEU NEGATIVE 07/26/2019 0220   HGBUR LARGE (A) 07/26/2019 0220   HGBUR negative 04/24/2009 1635   BILIRUBINUR NEGATIVE 07/26/2019 0220   BILIRUBINUR negative 05/29/2011 1439   KETONESUR 5 (A) 07/26/2019 0220   PROTEINUR 30 (A) 07/26/2019 0220   UROBILINOGEN 0.2 01/26/2014 1912   NITRITE NEGATIVE 07/26/2019 0220   LEUKOCYTESUR NEGATIVE 07/26/2019 0220      Imaging Results:    Dg Chest 2 View  Result Date: 07/26/2019 CLINICAL DATA:  Fall EXAM: CHEST - 2 VIEW COMPARISON:  04/22/2018 FINDINGS: Lungs are clear.  No pleural effusion or pneumothorax. The heart is normal in size. S shaped thoracolumbar scoliosis. Posttraumatic deformity involving the left humeral head/neck, chronic. IMPRESSION: No evidence of acute cardiopulmonary disease. Electronically Signed   By: Julian Hy M.D.   On: 07/26/2019 01:00   Dg Pelvis 1-2 Views  Result Date: 07/26/2019 CLINICAL DATA:  Fall, bilateral leg pain EXAM: PELVIS - 1-2 VIEW COMPARISON:  CT pelvis dated 09/10/2016 FINDINGS: No fracture or dislocation is seen. Visualized bony pelvis appears intact. Right hip arthroplasty. Status post ORIF of a prior intertrochanteric left hip fracture. Degenerative changes the lower lumbar spine. IMPRESSION: Status post right hip arthroplasty. Status post ORIF of a prior intertrochanteric left hip fracture. No acute fracture is seen. Electronically Signed   By: Julian Hy M.D.   On: 07/26/2019 01:01   Dg Wrist Complete  Right  Result Date: 07/26/2019 CLINICAL DATA:  Fall, right wrist pain EXAM: RIGHT WRIST - COMPLETE 3+ VIEW COMPARISON:  None. FINDINGS: No fracture or dislocation is seen. The joint spaces are preserved. Visualized soft tissues are within normal limits. IMPRESSION: Negative. Electronically Signed   By: Julian Hy M.D.   On: 07/26/2019 00:59   Dg Femur Min 2 Views Left  Result Date: 07/26/2019 CLINICAL DATA:  Fall, bilateral leg pain EXAM: LEFT FEMUR 2 VIEWS COMPARISON:  Intraoperative left femur radiographs dated 02/17/2017 FINDINGS: Status post ORIF of an intertrochanteric left hip fracture. No evidence of hardware loosening or complication. No evidence of acute fracture or dislocation. Left hip joint space is preserved. Distal left femur is unremarkable. IMPRESSION: Status post ORIF of an intertrochanteric left hip fracture, as above, without evidence of complication. No evidence of acute fracture or dislocation. Electronically Signed   By: Henderson Newcomer.D.  On: 07/26/2019 01:03   Dg Femur Min 2 Views Right  Result Date: 07/26/2019 CLINICAL DATA:  Fall, bilateral leg pain EXAM: RIGHT FEMUR 2 VIEWS COMPARISON:  None. FINDINGS: Status post right hip hemiarthroplasty. No acetabular component. No evidence of complication. No evidence of fracture or dislocation. Distal femur is unremarkable. IMPRESSION: Status post right hip hemiarthroplasty, without evidence of complication. Electronically Signed   By: Julian Hy M.D.   On: 07/26/2019 01:02       Assessment & Plan:    Principal Problem:   ARF (acute renal failure) (HCC) Active Problems:   Hypokalemia   Essential hypertension   Hyponatremia   Abnormal liver function   Acute renal failure Check abdominal ultrasound Hydrate with ns iv Check cmp in am  Hypokalemia, severe Replete Check cmp in am  Abnormal liver function Check abdominal ultrasound Check acute hepatitis panel Check CPK , rhabdo could explain her  Acute renal failure and abnormal liver function , please follow up  Check cmp in am  Failure to Thrive Social work consult PT to evaluate and tx OT to evaluate and tx  Hypertension ??? Monitor bp Hydralazine 80m iv q6h prn   Chronic back pain Cont oxycodone   R ankle inverted Xray hip and ankle ordered   DVT Prophylaxis-   Lovenox - SCDs   AM Labs Ordered, also please review Full Orders  Family Communication: Admission, patients condition and plan of care including tests being ordered have been discussed with the patient  who indicate understanding and agree with the plan and Code Status.  Code Status:  FULL CODE,  Spoke with grandson and notified him of her admission,  Work number is [phone] his girlfriend [phone]  Admission status: Inpatient: Based on patients clinical presentation and evaluation of above clinical data, I have made determination that patient meets Inpatient criteria at this time.  Pt will require iv hydration and repletion of potassium, to correct her renal function will likely take > 2 nites stay.  Pt has critically low potassium.    Time spent in minutes : 55 minutes   JJani GravelM.D on 07/26/2019 at 3:32 AM

## 2019-07-26 NOTE — ED Notes (Signed)
Pt said she is so hungry that she is hallucinating and that she hasn't eaten since yesterday morning.

## 2019-07-26 NOTE — ED Notes (Signed)
Date and time results received: 07/26/19 2:24 AM  (use smartphrase ".now" to insert current time)  Test: Potassium Critical Value: 2.4  Name of Provider Notified: Dr.Mesner  Orders Received? Or Actions Taken?:none

## 2019-07-26 NOTE — ED Notes (Signed)
Attempted to start IV twice. Both attempts failed. Placed order for IV start team.

## 2019-07-26 NOTE — Progress Notes (Signed)
PT Cancellation Note  Patient Details Name: Angelica Bell MRN: [id number] DOB: 11-22-1932   Cancelled Treatment:    Reason Eval/Treat Not Completed: Other (comment)(ankle xray pending. Will hold until this is completed.)   Philomena Doheny PT 07/26/2019  Acute Rehabilitation Services Pager [phone] Office [phone]

## 2019-07-26 NOTE — Plan of Care (Signed)
Bed Alarm in place. All belongings within reach. Pt will press call button when assistance is needed.

## 2019-07-26 NOTE — Progress Notes (Signed)
Ultrasound notified to keep patient NPO for abdominal US.

## 2019-07-26 NOTE — Progress Notes (Signed)
Patients grandson [name] updated regarding plan of care.

## 2019-07-26 NOTE — Progress Notes (Signed)
RN notified [facility]   admissions in [place] that there was a new patient from [facility] ED

## 2019-07-26 NOTE — Progress Notes (Signed)
PROGRESS NOTE    Angelica Bell  SLH:[id number] DOB: [birth year] DOA: 07/25/2019 PCP: Heywood Bene, PA-C   Brief Narrative:  Angelica Bell  is a 85 y.o. female,w hypertension, ? Rheumatoid arthritis (found in epic [facility]), osteoporosis, scoliosis,  chronic back pain, h/o PE (?? In our medical records in [facility] but can't find CTA chest or in Epic everywhere)  , h/o cellulitis, presents with c/o failure to thrive, recurrent falls at home.  Pt states that her poor po intake is due to not being able to get food ?   Assessment & Plan:   Principal Problem:   ARF (acute renal failure) (HCC) Active Problems:   Hypokalemia   Essential hypertension   Hyponatremia   Abnormal liver function   Acute renal failure Checked abdominal ultrasound-neg for acute pathology Cont to Hydrate with ns iv Monitor bmp CK pending  Hypokalemia, severe Repleted monitor  Abnormal liver function Improving with hdyration Hep panel pending Ck pending  Failure to Thrive Social work consult PT to evaluate and tx OT to evaluate and tx  Hypertension Monitor bp Hydralazine 5mg  iv q6h prn   Chronic back pain Cont oxycodone   R ankle inverted Xray hip and ankle ordered  DVT prophylaxis: SCD/Compression stockings  Code Status: Full    Code Status Orders  (From admission, onward)         Start     Ordered   07/26/19 0638  Full code  Continuous     07/26/19 0639        Code Status History    Date Active Date Inactive Code Status Order ID Comments User Context   10/25/2017 2021 10/31/2017 2107 Full Code [id number]  Mercy Riding, MD Inpatient   02/16/2017 1835 02/19/2017 1633 DNR [id number]  Gwynne Edinger, MD Inpatient   12/25/2015 0111 12/27/2015 1917 Full Code [id number]  Danford, Suann Larry, MD Inpatient   Advance Care Planning Activity     Family Communication: none today  Disposition Plan:   Patient remained inpatient for continued treatment of acute renal failure,  fluid resuscitation, further evaluation with physical therapy and Occupational Therapy second to failure to thrive.  There is no safe discharge plan second patient generalized weakness falls with risk of grave injury. Consults called: None Admission status: Inpatient   Consultants:   None  Procedures:  Dg Chest 2 View  Result Date: 07/26/2019 CLINICAL DATA:  Fall EXAM: CHEST - 2 VIEW COMPARISON:  04/22/2018 FINDINGS: Lungs are clear.  No pleural effusion or pneumothorax. The heart is normal in size. S shaped thoracolumbar scoliosis. Posttraumatic deformity involving the left humeral head/neck, chronic. IMPRESSION: No evidence of acute cardiopulmonary disease. Electronically Signed   By: Julian Hy M.D.   On: 07/26/2019 01:00   Dg Pelvis 1-2 Views  Result Date: 07/26/2019 CLINICAL DATA:  Fall, bilateral leg pain EXAM: PELVIS - 1-2 VIEW COMPARISON:  CT pelvis dated 09/10/2016 FINDINGS: No fracture or dislocation is seen. Visualized bony pelvis appears intact. Right hip arthroplasty. Status post ORIF of a prior intertrochanteric left hip fracture. Degenerative changes the lower lumbar spine. IMPRESSION: Status post right hip arthroplasty. Status post ORIF of a prior intertrochanteric left hip fracture. No acute fracture is seen. Electronically Signed   By: Julian Hy M.D.   On: 07/26/2019 01:01   Dg Wrist Complete Right  Result Date: 07/26/2019 CLINICAL DATA:  Fall, right wrist pain EXAM: RIGHT WRIST - COMPLETE 3+ VIEW COMPARISON:  None. FINDINGS: No fracture or dislocation is seen.  The joint spaces are preserved. Visualized soft tissues are within normal limits. IMPRESSION: Negative. Electronically Signed   By: Julian Hy M.D.   On: 07/26/2019 00:59   US Abdomen Complete  Result Date: 07/26/2019 CLINICAL DATA:  Abnormal liver function tests. EXAM: ABDOMEN ULTRASOUND COMPLETE COMPARISON:  None. FINDINGS: Gallbladder: Stones almost completely filling the gallbladder. Discrete  measurement is difficult but stones measure up to approximately 1.5 cm in diameter. There is no wall thickening or pericholecystic fluid. Common bile duct: Diameter: 0.3 cm Liver: No focal lesion identified. Within normal limits in parenchymal echogenicity. Portal vein is patent on color Doppler imaging with normal direction of blood flow towards the liver. IVC: No abnormality visualized. Pancreas: Visualized portion unremarkable. Spleen: Size and appearance within normal limits. Right Kidney: Length: 9.4 cm. Echogenicity within normal limits. No mass or hydronephrosis visualized. Left Kidney: Length: 8 cm. Echogenicity within normal limits. No mass or hydronephrosis visualized. Abdominal aorta: No aneurysm visualized. Other findings: None. IMPRESSION: Gallstones without evidence of cholecystitis. The exam is otherwise negative. Electronically Signed   By: Inge Rise M.D.   On: 07/26/2019 10:09   Dg Femur Min 2 Views Left  Result Date: 07/26/2019 CLINICAL DATA:  Fall, bilateral leg pain EXAM: LEFT FEMUR 2 VIEWS COMPARISON:  Intraoperative left femur radiographs dated 02/17/2017 FINDINGS: Status post ORIF of an intertrochanteric left hip fracture. No evidence of hardware loosening or complication. No evidence of acute fracture or dislocation. Left hip joint space is preserved. Distal left femur is unremarkable. IMPRESSION: Status post ORIF of an intertrochanteric left hip fracture, as above, without evidence of complication. No evidence of acute fracture or dislocation. Electronically Signed   By: Julian Hy M.D.   On: 07/26/2019 01:03   Dg Femur Min 2 Views Right  Result Date: 07/26/2019 CLINICAL DATA:  Fall, bilateral leg pain EXAM: RIGHT FEMUR 2 VIEWS COMPARISON:  None. FINDINGS: Status post right hip hemiarthroplasty. No acetabular component. No evidence of complication. No evidence of fracture or dislocation. Distal femur is unremarkable. IMPRESSION: Status post right hip hemiarthroplasty,  without evidence of complication. Electronically Signed   By: Julian Hy M.D.   On: 07/26/2019 01:02     Antimicrobials:   None currently   Subjective: Patient remains in the hospital reporting overnight she feels terrible, no focal problem generalized weakness. Reports she has no assistance at home  Objective: Vitals:   07/26/19 0337 07/26/19 0400 07/26/19 0441 07/26/19 0442  BP:  107/72 (!) 106/46   Pulse: 76 60 69   Resp: 15 (!) 25 16   Temp:   98.1 F (36.7 C)   TempSrc:      SpO2: 95% (!) 88% 100% 100%  Weight:      Height:        Intake/Output Summary (Last 24 hours) at 07/26/2019 1256 Last data filed at 07/26/2019 0401 Gross per 24 hour  Intake 1988 ml  Output 400 ml  Net 1588 ml   Filed Weights   07/25/19 2258  Weight: 55.8 kg    Examination:  General exam: Appears calm, reports pain but no evidence on physical exam Respiratory system: Clear to auscultation. Respiratory effort normal. Cardiovascular system: S1 & S2 heard, RRR. No JVD, murmurs, rubs, gallops or clicks. No pedal edema. Gastrointestinal system: Abdomen is nondistended, soft and nontender. No organomegaly or masses felt. Normal bowel sounds heard. Central nervous system: Alert and oriented. No focal neurological deficits. Extremities: Symmetric 5 x 5 strength, warm well perfused Skin: No rashes, lesions or  ulcers Psychiatry: Judgement and insight appear normal. Mood & affect flat    Data Reviewed: I have personally reviewed following labs and imaging studies  CBC: Recent Labs  Lab 07/25/19 2338  WBC 16.3*  NEUTROABS 12.9*  HGB 13.8  HCT 42.3  MCV 84.9  PLT 595   Basic Metabolic Panel: Recent Labs  Lab 07/25/19 2338 07/26/19 0825  NA 134* 136  K 2.4* 3.0*  CL 95* 101  CO2 24 22  GLUCOSE 119* 111*  BUN 61* 55*  CREATININE 2.76* 2.23*  CALCIUM 8.8* 8.0*  MG 2.1  --    GFR: Estimated Creatinine Clearance: 16.2 mL/min (A) (by C-G formula based on SCr of 2.23 mg/dL  (H)). Liver Function Tests: Recent Labs  Lab 07/25/19 2338 07/26/19 0825  AST 150* 109*  ALT 90* 69*  ALKPHOS 85 68  BILITOT 0.7 0.5  PROT 6.6 5.3*  ALBUMIN 3.4* 2.7*   No results for input(s): LIPASE, AMYLASE in the last 168 hours. No results for input(s): AMMONIA in the last 168 hours. Coagulation Profile: No results for input(s): INR, PROTIME in the last 168 hours. Cardiac Enzymes: Recent Labs  Lab 07/26/19 0825  CKTOTAL 2,584*  CKMB 34.6*   BNP (last 3 results) No results for input(s): PROBNP in the last 8760 hours. HbA1C: No results for input(s): HGBA1C in the last 72 hours. CBG: No results for input(s): GLUCAP in the last 168 hours. Lipid Profile: No results for input(s): CHOL, HDL, LDLCALC, TRIG, CHOLHDL, LDLDIRECT in the last 72 hours. Thyroid Function Tests: No results for input(s): TSH, T4TOTAL, FREET4, T3FREE, THYROIDAB in the last 72 hours. Anemia Panel: No results for input(s): VITAMINB12, FOLATE, FERRITIN, TIBC, IRON, RETICCTPCT in the last 72 hours. Sepsis Labs: No results for input(s): PROCALCITON, LATICACIDVEN in the last 168 hours.  Recent Results (from the past 240 hour(s))  SARS Coronavirus 2 (CEPHEID - Performed in [place] hospital lab), Hosp Order     Status: None   Collection Time: 07/26/19  3:02 AM   Specimen: Nasopharyngeal Swab  Result Value Ref Range Status   SARS Coronavirus 2 NEGATIVE NEGATIVE Final    Comment: (NOTE) If result is NEGATIVE SARS-CoV-2 target nucleic acids are NOT DETECTED. The SARS-CoV-2 RNA is generally detectable in upper and lower  respiratory specimens during the acute phase of infection. The lowest  concentration of SARS-CoV-2 viral copies this assay can detect is 250  copies / mL. A negative result does not preclude SARS-CoV-2 infection  and should not be used as the sole basis for treatment or other  patient management decisions.  A negative result may occur with  improper specimen collection / handling,  submission of specimen other  than nasopharyngeal swab, presence of viral mutation(s) within the  areas targeted by this assay, and inadequate number of viral copies  (<250 copies / mL). A negative result must be combined with clinical  observations, patient history, and epidemiological information. If result is POSITIVE SARS-CoV-2 target nucleic acids are DETECTED. The SARS-CoV-2 RNA is generally detectable in upper and lower  respiratory specimens dur ing the acute phase of infection.  Positive  results are indicative of active infection with SARS-CoV-2.  Clinical  correlation with patient history and other diagnostic information is  necessary to determine patient infection status.  Positive results do  not rule out bacterial infection or co-infection with other viruses. If result is PRESUMPTIVE POSTIVE SARS-CoV-2 nucleic acids MAY BE PRESENT.   A presumptive positive result was obtained on the submitted  specimen  and confirmed on repeat testing.  While 2019 novel coronavirus  (SARS-CoV-2) nucleic acids may be present in the submitted sample  additional confirmatory testing may be necessary for epidemiological  and / or clinical management purposes  to differentiate between  SARS-CoV-2 and other Sarbecovirus currently known to infect humans.  If clinically indicated additional testing with an alternate test  methodology [phone]) is advised. The SARS-CoV-2 RNA is generally  detectable in upper and lower respiratory sp ecimens during the acute  phase of infection. The expected result is Negative. Fact Sheet for Patients:  StrictlyIdeas.no Fact Sheet for Healthcare Providers: BankingDealers.co.za This test is not yet approved or cleared by the [place] FDA and has been authorized for detection and/or diagnosis of SARS-CoV-2 by FDA under an Emergency Use Authorization (EUA).  This EUA will remain in effect (meaning this test can be  used) for the duration of the COVID-19 declaration under Section 564(b)(1) of the Act, 21 U.S.C. section 360bbb-3(b)(1), unless the authorization is terminated or revoked sooner. Performed at [facility], [place] [address], [place], [place] [address]          Radiology Studies: Dg Chest 2 View  Result Date: 07/26/2019 CLINICAL DATA:  Fall EXAM: CHEST - 2 VIEW COMPARISON:  04/22/2018 FINDINGS: Lungs are clear.  No pleural effusion or pneumothorax. The heart is normal in size. S shaped thoracolumbar scoliosis. Posttraumatic deformity involving the left humeral head/neck, chronic. IMPRESSION: No evidence of acute cardiopulmonary disease. Electronically Signed   By: Julian Hy M.D.   On: 07/26/2019 01:00   Dg Pelvis 1-2 Views  Result Date: 07/26/2019 CLINICAL DATA:  Fall, bilateral leg pain EXAM: PELVIS - 1-2 VIEW COMPARISON:  CT pelvis dated 09/10/2016 FINDINGS: No fracture or dislocation is seen. Visualized bony pelvis appears intact. Right hip arthroplasty. Status post ORIF of a prior intertrochanteric left hip fracture. Degenerative changes the lower lumbar spine. IMPRESSION: Status post right hip arthroplasty. Status post ORIF of a prior intertrochanteric left hip fracture. No acute fracture is seen. Electronically Signed   By: Julian Hy M.D.   On: 07/26/2019 01:01   Dg Wrist Complete Right  Result Date: 07/26/2019 CLINICAL DATA:  Fall, right wrist pain EXAM: RIGHT WRIST - COMPLETE 3+ VIEW COMPARISON:  None. FINDINGS: No fracture or dislocation is seen. The joint spaces are preserved. Visualized soft tissues are within normal limits. IMPRESSION: Negative. Electronically Signed   By: Julian Hy M.D.   On: 07/26/2019 00:59   US Abdomen Complete  Result Date: 07/26/2019 CLINICAL DATA:  Abnormal liver function tests. EXAM: ABDOMEN ULTRASOUND COMPLETE COMPARISON:  None. FINDINGS: Gallbladder: Stones almost completely filling the gallbladder. Discrete  measurement is difficult but stones measure up to approximately 1.5 cm in diameter. There is no wall thickening or pericholecystic fluid. Common bile duct: Diameter: 0.3 cm Liver: No focal lesion identified. Within normal limits in parenchymal echogenicity. Portal vein is patent on color Doppler imaging with normal direction of blood flow towards the liver. IVC: No abnormality visualized. Pancreas: Visualized portion unremarkable. Spleen: Size and appearance within normal limits. Right Kidney: Length: 9.4 cm. Echogenicity within normal limits. No mass or hydronephrosis visualized. Left Kidney: Length: 8 cm. Echogenicity within normal limits. No mass or hydronephrosis visualized. Abdominal aorta: No aneurysm visualized. Other findings: None. IMPRESSION: Gallstones without evidence of cholecystitis. The exam is otherwise negative. Electronically Signed   By: Inge Rise M.D.   On: 07/26/2019 10:09   Dg Femur Min 2 Views Left  Result Date:  07/26/2019 CLINICAL DATA:  Fall, bilateral leg pain EXAM: LEFT FEMUR 2 VIEWS COMPARISON:  Intraoperative left femur radiographs dated 02/17/2017 FINDINGS: Status post ORIF of an intertrochanteric left hip fracture. No evidence of hardware loosening or complication. No evidence of acute fracture or dislocation. Left hip joint space is preserved. Distal left femur is unremarkable. IMPRESSION: Status post ORIF of an intertrochanteric left hip fracture, as above, without evidence of complication. No evidence of acute fracture or dislocation. Electronically Signed   By: Julian Hy M.D.   On: 07/26/2019 01:03   Dg Femur Min 2 Views Right  Result Date: 07/26/2019 CLINICAL DATA:  Fall, bilateral leg pain EXAM: RIGHT FEMUR 2 VIEWS COMPARISON:  None. FINDINGS: Status post right hip hemiarthroplasty. No acetabular component. No evidence of complication. No evidence of fracture or dislocation. Distal femur is unremarkable. IMPRESSION: Status post right hip hemiarthroplasty,  without evidence of complication. Electronically Signed   By: Julian Hy M.D.   On: 07/26/2019 01:02        Scheduled Meds: . aspirin EC  81 mg Oral Daily  . enoxaparin (LOVENOX) injection  30 mg Subcutaneous Q24H   Continuous Infusions: . 0.9 % NaCl with KCl 20 mEq / L 100 mL/hr at 07/26/19 0949     LOS: 0 days    Time spent: 35  min    Nicolette Bang, MD [facility]  If 7PM-7AM, please contact night-coverage  07/26/2019, 12:56 PM

## 2019-07-27 ENCOUNTER — Inpatient Hospital Stay (HOSPITAL_COMMUNITY): Payer: Medicare Other

## 2019-07-27 DIAGNOSIS — I639 Cerebral infarction, unspecified: Secondary | ICD-10-CM

## 2019-07-27 LAB — CBC
HCT: 29.4 % — ABNORMAL LOW (ref 36.0–46.0)
Hemoglobin: 9.2 g/dL — ABNORMAL LOW (ref 12.0–15.0)
MCH: 27.9 pg (ref 26.0–34.0)
MCHC: 31.3 g/dL (ref 30.0–36.0)
MCV: 89.1 fL (ref 80.0–100.0)
Platelets: 194 10*3/uL (ref 150–400)
RBC: 3.3 MIL/uL — ABNORMAL LOW (ref 3.87–5.11)
RDW: 13.6 % (ref 11.5–15.5)
WBC: 8.3 10*3/uL (ref 4.0–10.5)
nRBC: 0 % (ref 0.0–0.2)

## 2019-07-27 LAB — COMPREHENSIVE METABOLIC PANEL
ALT: 55 U/L — ABNORMAL HIGH (ref 0–44)
AST: 76 U/L — ABNORMAL HIGH (ref 15–41)
Albumin: 2.2 g/dL — ABNORMAL LOW (ref 3.5–5.0)
Alkaline Phosphatase: 49 U/L (ref 38–126)
Anion gap: 7 (ref 5–15)
BUN: 43 mg/dL — ABNORMAL HIGH (ref 8–23)
CO2: 20 mmol/L — ABNORMAL LOW (ref 22–32)
Calcium: 7.7 mg/dL — ABNORMAL LOW (ref 8.9–10.3)
Chloride: 113 mmol/L — ABNORMAL HIGH (ref 98–111)
Creatinine, Ser: 1.67 mg/dL — ABNORMAL HIGH (ref 0.44–1.00)
GFR calc Af Amer: 32 mL/min — ABNORMAL LOW (ref >60)
GFR calc non Af Amer: 28 mL/min — ABNORMAL LOW (ref >60)
Glucose, Bld: 93 mg/dL (ref 70–99)
Potassium: 3.7 mmol/L (ref 3.5–5.1)
Sodium: 140 mmol/L (ref 135–145)
Total Bilirubin: 0.2 mg/dL — ABNORMAL LOW (ref 0.3–1.2)
Total Protein: 4.4 g/dL — ABNORMAL LOW (ref 6.5–8.1)

## 2019-07-27 LAB — CK
Total CK: 1367 U/L — ABNORMAL HIGH (ref 38–234)
Total CK: 832 U/L — ABNORMAL HIGH (ref 38–234)

## 2019-07-27 MED ORDER — ESCITALOPRAM OXALATE 10 MG PO TABS
5.0000 mg | ORAL_TABLET | Freq: Every day | ORAL | Status: DC
  Administered 2019-07-27 – 2019-07-31 (×5): 5 mg via ORAL
  Filled 2019-07-27 (×5): qty 1

## 2019-07-27 MED ORDER — SODIUM CHLORIDE 0.9 % IV SOLN
INTRAVENOUS | Status: DC
  Administered 2019-07-27 – 2019-07-28 (×3): via INTRAVENOUS

## 2019-07-27 MED ORDER — LORAZEPAM 2 MG/ML IJ SOLN
0.5000 mg | INTRAMUSCULAR | Status: DC | PRN
  Administered 2019-07-27 – 2019-08-01 (×7): 0.5 mg via INTRAVENOUS
  Filled 2019-07-27 (×7): qty 1

## 2019-07-27 MED ORDER — ATORVASTATIN CALCIUM 10 MG PO TABS
10.0000 mg | ORAL_TABLET | Freq: Every day | ORAL | Status: DC
  Administered 2019-07-27 – 2019-08-06 (×10): 10 mg via ORAL
  Filled 2019-07-27 (×12): qty 1

## 2019-07-27 NOTE — Progress Notes (Signed)
Carotid duplex has been completed.   Preliminary results in CV Proc.   Abram Sander 07/27/2019 2:19 PM

## 2019-07-27 NOTE — Progress Notes (Signed)
V.O received Patient may travel without telemetry. SR 60's -80's

## 2019-07-27 NOTE — Plan of Care (Signed)
Bed alarm in place. Patient will call when needing assistance in and out of bed

## 2019-07-27 NOTE — Progress Notes (Signed)
Patient was seen by OT this am and reports pt inability to use left arm. Pt assessed, appears to be agitated and anxious. Grip is decreased and pt appears to be unable to lift arm above head and or hold it. RUE, RLE & LLE are wnl. Will continue to monitor. OT paged provider with update. Orders received.

## 2019-07-27 NOTE — Evaluation (Signed)
Occupational Therapy Evaluation Patient Details Name: Angelica Bell MRN: [id number] DOB: 1932/10/23 Today's Date: 07/27/2019    History of Present Illness This 85 y.o. female admitted after fall in the bathroom, recurrent falls, and FTT.   Xrays negative for fracture.   Dx:  acute renal failure, hypokalemai, hyponatremia, abnormal liver function.  PMH includes:  RA, HTN, osteoporosis, scoliosis, chronic back pain, h/o PE, h/o cellulitis, s/p Rt THA    Clinical Impression   Pt admitted with above. She demonstrates the below listed deficits and will benefit from continued OT to maximize safety and independence with BADLs.  Pt presents to OT with significant confusion and cognitive deficits.  She agitates easily.  She was oriented to self only, and intermittently to place.  She demonstrates focused attention, with intermittent periods of brief sustained attention.  She demonstrates Lt UE weakness, and appears to at times neglect Lt UE (leaves it behind her when moving to EOB, and lifted Rt UE when asked to lift Lt UE).  She reads the clock back words, and repeatedly donned her glasses upside down.  She moved to EOB with min guard assist/supervision with heavy use of Rt bed rail.  She frequently losses sitting balance to the Lt and forward.  Unable to assess standing or transfers as pt adamantly refused to allow therapist to assist or guard her and was therefore unsafe to attempt.   SHe reports she lives alone and was independent.  Anticipate she will require SNF level rehab at discharge and may benefit from transition to ADL.  Will follow acutely.       Follow Up Recommendations  SNF    Equipment Recommendations  None recommended by OT    Recommendations for Other Services       Precautions / Restrictions Precautions Precautions: Fall Precaution Comments: Pt confused, becomes easily agitated       Mobility Bed Mobility Overal bed mobility: Needs Assistance Bed Mobility: Supine to  Sit;Sit to Supine     Supine to sit: Supervision Sit to supine: Mod assist   General bed mobility comments: Pt relies heavily on Rt bedrail to pull herself up in bed.  She frequently falls to the Lt.  She moves to EOB below the bedrails spontaneously.  She refused to return to supine and required physical assist to do so   Transfers                 General transfer comment: unable to assess as pt with poor safety awareness, and would not allow therapist to assist or guard her even with apparent balance deficits when sitting    Balance Overall balance assessment: Needs assistance Sitting-balance support: Feet supported;Single extremity supported Sitting balance-Leahy Scale: Poor Sitting balance - Comments: frequently falls over and at times slings herself forward and sideways.  She she does sit upright, she is reliant on UE support  Postural control: Posterior lean;Left lateral lean                                 ADL either performed or assessed with clinical judgement   ADL Overall ADL's : Needs assistance/impaired Eating/Feeding: Set up;Supervision/ safety;Bed level   Grooming: Wash/dry face;Wash/dry hands;Oral care;Brushing hair;Moderate assistance;Sitting   Upper Body Bathing: Maximal assistance;Sitting   Lower Body Bathing: Maximal assistance;Bed level   Upper Body Dressing : Maximal assistance;Sitting   Lower Body Dressing: Total assistance;Bed level   Toilet Transfer: Total assistance  Toilet Transfer Details (indicate cue type and reason): unable to safely attempt as pt adamantly refused to allow therapist to guard or assist her.  Becomes agitated quickly  Toileting- Water quality scientist and Hygiene: Total assistance;Bed level       Functional mobility during ADLs: (unable to accurately assess )       Vision Baseline Vision/History: Wears glasses Wears Glasses: At all times Additional Comments: pt unable to participate in formal assessment.  SHe was unable to read the clock accurately insists it was 3:20 instead of 10:20.       Perception Perception Perception Tested?: Yes Perception Deficits: Inattention/neglect Inattention/Neglect: Does not attend to left side of body Spatial deficits: Pt demonstrates questionable Lt inattention - see comments under UE function    Praxis Praxis Praxis tested?: Deficits Deficits: Organization Praxis-Other Comments: Pt frequently dons glasses upside down with no awareness     Pertinent Vitals/Pain Pain Assessment: Faces Faces Pain Scale: Hurts little more Pain Location: Lt UE upon OT's entry, but later denied that it hurt  Pain Descriptors / Indicators: Grimacing;Guarding Pain Intervention(s): Monitored during session     Hand Dominance Right   Extremity/Trunk Assessment Upper Extremity Assessment Upper Extremity Assessment: LUE deficits/detail;RUE deficits/detail RUE Deficits / Details: Pt moves Rt UE spontaneously  LUE Deficits / Details: upon OT enterance, she was holding her Lt UE limply in her Rt hand stating that it hurt, when she lets go of it, it drops to the bed.  She will move in small ranges at elbow, and fingers intermittently.  Upon moving to EOB Lt UE drug behind her with wrist flexed.   When asked to reach with her left UE, she screams, "that's what I keep trying to tell you dumbass, I can't and you won't help me" Grip is 2+/5-3-/5.   She refused further assessment of Lt UE until she was moved back to supine.  At that time, I asked her to raise her Lt arm overhead, and she raised her Rt UE overhead.  When I asked her to show me her Lt arm, she repeatedly held up her Rt UE  LUE Sensation: decreased proprioception LUE Coordination: decreased fine motor;decreased gross motor   Lower Extremity Assessment Lower Extremity Assessment: Defer to PT evaluation   Cervical / Trunk Assessment Cervical / Trunk Assessment: Kyphotic;Other exceptions(scoliosis ) Cervical / Trunk  Exceptions: scoliosis    Communication Communication Communication: HOH;Expressive difficulties(pt's conversation disjointed and at times does not make sens)   Cognition Arousal/Alertness: Awake/alert Behavior During Therapy: Restless;Agitated;Anxious;Impulsive Overall Cognitive Status: Impaired/Different from baseline Area of Impairment: Orientation;Attention;Memory;Following commands;Safety/judgement;Awareness;Problem solving                 Orientation Level: Disoriented to;Time;Situation;Place Current Attention Level: Focused;Sustained Memory: Decreased short-term memory Following Commands: Follows one step commands inconsistently Safety/Judgement: Decreased awareness of safety;Decreased awareness of deficits   Problem Solving: Slow processing;Decreased initiation;Difficulty sequencing;Requires verbal cues;Requires tactile cues General Comments: Pt is at times able to state she is at [facility], but another time, she states she "told those girls at work this morning"  Speech very tangential with occasional language of confusion.  She is very internally distracted and at times can't complete her thoughts.   SHe has poor safety awareness insisting that she is fine to move despite falling over sitting EOB.  She becomes agitated when attempts to assist her made    General Comments  RN and MD notified of Lt UE weakness as well as possible Lt inattention/neglect  Exercises     Shoulder Instructions      Home Living Family/patient expects to be discharged to:: Private residence Living Arrangements: Alone Available Help at Discharge: Family;Available PRN/intermittently Type of Home: House Home Access: Stairs to enter [employer] of Steps: 1 Entrance Stairs-Rails: None Home Layout: One level     Bathroom Shower/Tub: Occupational psychologist: Handicapped height     Home Equipment: Environmental consultant - 2 wheels   Additional Comments: information gleaned from  previous admissions as she is very confused and became agitated with questioning        Prior Functioning/Environment Level of Independence: Independent        Comments: Pt indicates she was fully independent, but she was not able to provide details due to poor attention, and subsequent agitation when questions asked          OT Problem List: Decreased strength;Decreased range of motion;Decreased activity tolerance;Impaired balance (sitting and/or standing);Impaired vision/perception;Decreased coordination;Decreased cognition;Decreased knowledge of use of DME or AE;Decreased safety awareness;Impaired UE functional use      OT Treatment/Interventions: Self-care/ADL training;Neuromuscular education;Therapeutic activities;Cognitive remediation/compensation;Visual/perceptual remediation/compensation;Patient/family education;Balance training;DME and/or AE instruction    OT Goals(Current goals can be found in the care plan section) Acute Rehab OT Goals Patient Stated Goal: For you to leave me alone and stop bothering me  OT Goal Formulation: With patient Time For Goal Achievement: 08/10/19 Potential to Achieve Goals: Good ADL Goals Pt Will Perform Grooming: (P) with min assist;standing Pt Will Perform Upper Body Bathing: (P) with min assist;sitting Pt Will Perform Lower Body Bathing: (P) with mod assist;sit to/from stand Pt Will Perform Upper Body Dressing: (P) with mod assist;sitting Pt Will Perform Lower Body Dressing: (P) with mod assist;sit to/from stand Pt Will Transfer to Toilet: (P) with min assist;stand pivot transfer;bedside commode Pt Will Perform Toileting - Clothing Manipulation and hygiene: (P) with mod assist;sit to/from stand Additional ADL Goal #1: (P) Pt will sustain attention to simple ADL task x 5 mins with min cues Additional ADL Goal #2: (P) Pt will be oriented x 4 with external cues  OT Frequency: Min 2X/week   Barriers to D/C: Decreased caregiver support           Co-evaluation              AM-PAC OT "6 Clicks" Daily Activity     Outcome Measure Help from another person eating meals?: A Little Help from another person taking care of personal grooming?: A Lot Help from another person toileting, which includes using toliet, bedpan, or urinal?: Total Help from another person bathing (including washing, rinsing, drying)?: A Lot Help from another person to put on and taking off regular upper body clothing?: A Lot Help from another person to put on and taking off regular lower body clothing?: Total 6 Click Score: 11   End of Session Nurse Communication: Mobility status  Activity Tolerance: Treatment limited secondary to agitation Patient left: in bed;with call bell/phone within reach;with bed alarm set  OT Visit Diagnosis: Unsteadiness on feet (R26.81);Cognitive communication deficit (R41.841)                Time: 2595-6387 OT Time Calculation (min): 24 min Charges:  OT General Charges $OT Visit: 1 Visit OT Evaluation $OT Eval Moderate Complexity: 1 Mod OT Treatments $Therapeutic Activity: 8-22 mins  Lucille Passy, OTR/L Acute Rehabilitation Services Pager [phone] Office [phone]   Lucille Passy M 07/27/2019, 11:43 AM

## 2019-07-27 NOTE — Progress Notes (Signed)
PT Cancellation Note  Patient Details Name: Angelica KATHAN MRN: [id number] DOB: [birth year]   Cancelled Treatment:    Reason Eval/Treat Not Completed: Medical issues which prohibited therapy Pending MRI (?stroke).  Will await work up.   Tiki Tucciarone,KATHrine E 07/27/2019, 2:05 PM Carmelia Bake, PT, DPT Acute Rehabilitation Services Office: [phone] Pager: [phone]

## 2019-07-27 NOTE — Progress Notes (Addendum)
Call placed to patients grandson [place]. Update provided regarding plan of care. Grandson reports having to assist pt with ADL's and also reports difficulty with urination.

## 2019-07-27 NOTE — Progress Notes (Signed)
PROGRESS NOTE    [name]  EVO:[id number] DOB: 28-Oct-1932 DOA: 07/25/2019 PCP: Heywood Bene, PA-C   Brief Narrative:  [name] a85 y.o.female,whypertension, ? Rheumatoid arthritis (found in epic [facility]), osteoporosis, scoliosis, chronic back pain, h/o PE (?? In our medical records in [facility] but can't find CTA chest or in Epic everywhere) , h/o cellulitis, presents with c/o failure to thrive, recurrent falls at home. Pt states that her poor po intake is due to not being able to get food ?   Assessment & Plan:   Principal Problem:   ARF (acute renal failure) (HCC) Active Problems:   Hypokalemia   Essential hypertension   Hyponatremia   Abnormal liver function   Acute renal failure Checked abdominal ultrasound-neg for acute pathology Cont to Hydrate with ns iv Monitor bmp CK elevated in this pt found on floor for about 12 hours, will trend  LUE Weakness: Unclear if this is secondary to fall and pain CT of head was negative Continue aspirin Check MRI of brain, carotid ultrasounds, echo Check lipid profile add a statin  Discussed patient's weakness with grandson this morning.  Apparently patient remains in bed for 10 to 14 hours a day has chronic weakness, is minimally interactive with family and has appeared to "given up"  Hypokalemia, severe Repleted monitor  Abnormal liver function Improving with hdyration Hep panel pending Ck elevated CMP in AM  Failure to Thrive Social work consult ? placement PT eval appreciated OT eval appreciated  Hypertension Monitor bp Hydralazine 5mg  iv q6h prn   Chronic back pain Cont oxycodone   R ankle inverted in this RA pt Xray negative  DVT prophylaxis: SCD/Compression stockings  Code Status: Full    Code Status Orders  (From admission, onward)         Start     Ordered   07/26/19 0638  Full code  Continuous     07/26/19 0639        Code Status History    Date Active Date  Inactive Code Status Order ID Comments User Context   10/25/2017 2021 10/31/2017 2107 Full Code [id number]  Mercy Riding, MD Inpatient   02/16/2017 1835 02/19/2017 1633 DNR [id number]  Gwynne Edinger, MD Inpatient   12/25/2015 0111 12/27/2015 1917 Full Code [id number]  Danford, Suann Larry, MD Inpatient   Advance Care Planning Activity     Family Communication: called grandson, Discussed plan of care Disposition Plan:   Patient remained inpatient for continued treatment of acute renal failure, rhabdo, fluid resuscitation, further evaluation with physical therapy and Occupational Therapy second to failure to thrive.  There is no safe discharge plan second patient generalized weakness falls with risk of grave injury. Consults called: None Admission status: Inpatient   Consultants:   None  Procedures:  Dg Chest 2 View  Result Date: 07/26/2019 CLINICAL DATA:  Fall EXAM: CHEST - 2 VIEW COMPARISON:  04/22/2018 FINDINGS: Lungs are clear.  No pleural effusion or pneumothorax. The heart is normal in size. S shaped thoracolumbar scoliosis. Posttraumatic deformity involving the left humeral head/neck, chronic. IMPRESSION: No evidence of acute cardiopulmonary disease. Electronically Signed   By: Julian Hy M.D.   On: 07/26/2019 01:00   Dg Pelvis 1-2 Views  Result Date: 07/26/2019 CLINICAL DATA:  Fall, bilateral leg pain EXAM: PELVIS - 1-2 VIEW COMPARISON:  CT pelvis dated 09/10/2016 FINDINGS: No fracture or dislocation is seen. Visualized bony pelvis appears intact. Right hip arthroplasty. Status post ORIF of a prior intertrochanteric left hip  fracture. Degenerative changes the lower lumbar spine. IMPRESSION: Status post right hip arthroplasty. Status post ORIF of a prior intertrochanteric left hip fracture. No acute fracture is seen. Electronically Signed   By: Julian Hy M.D.   On: 07/26/2019 01:01   Dg Wrist Complete Right  Result Date: 07/26/2019 CLINICAL DATA:  Fall, right wrist  pain EXAM: RIGHT WRIST - COMPLETE 3+ VIEW COMPARISON:  None. FINDINGS: No fracture or dislocation is seen. The joint spaces are preserved. Visualized soft tissues are within normal limits. IMPRESSION: Negative. Electronically Signed   By: Julian Hy M.D.   On: 07/26/2019 00:59   Dg Ankle Complete Right  Result Date: 07/26/2019 CLINICAL DATA:  85 y.o. female,w hypertension, ? Rheumatoid arthritis (found in epic [place]), osteoporosis, scoliosis, chronic back pain, h/o PE (?? In our medical records in [facility] but can't find CTA chest or in Epic everywhere) , h/o cellulitis. EXAM: RIGHT ANKLE - COMPLETE 3+ VIEW COMPARISON:  None. FINDINGS: There is no evidence of fracture, dislocation, or joint effusion. There is no evidence of arthropathy or other focal bone abnormality. Soft tissues are unremarkable. IMPRESSION: Negative. Electronically Signed   By: Nolon Nations M.D.   On: 07/26/2019 13:38   Ct Head Wo Contrast  Result Date: 07/27/2019 CLINICAL DATA:  Rule out cerebral hemorrhage. Hypertension. Recurrent falls EXAM: CT HEAD WITHOUT CONTRAST TECHNIQUE: Contiguous axial images were obtained from the base of the skull through the vertex without intravenous contrast. COMPARISON:  CT head 01/23/2019 FINDINGS: Brain: Generalized atrophy. Chronic microvascular ischemic changes in the white matter bilaterally. Chronic infarct right cerebellum unchanged. Negative for acute infarct. Negative for hemorrhage or mass. No midline shift. Vascular: Negative for hyperdense vessel Skull: Negative Sinuses/Orbits: Negative Other: None IMPRESSION: No acute abnormality. Atrophy and chronic ischemic changes as above. Electronically Signed   By: Franchot Gallo M.D.   On: 07/27/2019 12:17   US Abdomen Complete  Result Date: 07/26/2019 CLINICAL DATA:  Abnormal liver function tests. EXAM: ABDOMEN ULTRASOUND COMPLETE COMPARISON:  None. FINDINGS: Gallbladder: Stones almost completely filling the gallbladder. Discrete measurement  is difficult but stones measure up to approximately 1.5 cm in diameter. There is no wall thickening or pericholecystic fluid. Common bile duct: Diameter: 0.3 cm Liver: No focal lesion identified. Within normal limits in parenchymal echogenicity. Portal vein is patent on color Doppler imaging with normal direction of blood flow towards the liver. IVC: No abnormality visualized. Pancreas: Visualized portion unremarkable. Spleen: Size and appearance within normal limits. Right Kidney: Length: 9.4 cm. Echogenicity within normal limits. No mass or hydronephrosis visualized. Left Kidney: Length: 8 cm. Echogenicity within normal limits. No mass or hydronephrosis visualized. Abdominal aorta: No aneurysm visualized. Other findings: None. IMPRESSION: Gallstones without evidence of cholecystitis. The exam is otherwise negative. Electronically Signed   By: Inge Rise M.D.   On: 07/26/2019 10:09   Dg Hip Unilat With Pelvis 2-3 Views Right  Result Date: 07/26/2019 CLINICAL DATA:  85 y.o. female,w hypertension, ? Rheumatoid arthritis (found in epic [facility]), osteoporosis, scoliosis, chronic back pain, h/o PE (?? In our medical records in [facility] but can't find CTA chest or in Epic everywhere), history of cellulitis. EXAM: DG HIP (WITH OR WITHOUT PELVIS) 2-3V RIGHT COMPARISON:  07/26/2019, 01/23/2019 FINDINGS: Status post ORIF of the LEFT hip. Patient has had RIGHT hip arthroplasty. The hardware is intact. No evidence for acute fracture or subluxation. Degenerative changes are seen in the spine. IMPRESSION: 1. No evidence for acute abnormality. 2. Hardware intact. Electronically Signed   By: Benjamine Mola  Owens Shark M.D.   On: 07/26/2019 13:41   Dg Femur Min 2 Views Left  Result Date: 07/26/2019 CLINICAL DATA:  Fall, bilateral leg pain EXAM: LEFT FEMUR 2 VIEWS COMPARISON:  Intraoperative left femur radiographs dated 02/17/2017 FINDINGS: Status post ORIF of an intertrochanteric left hip fracture. No evidence of hardware loosening or  complication. No evidence of acute fracture or dislocation. Left hip joint space is preserved. Distal left femur is unremarkable. IMPRESSION: Status post ORIF of an intertrochanteric left hip fracture, as above, without evidence of complication. No evidence of acute fracture or dislocation. Electronically Signed   By: Julian Hy M.D.   On: 07/26/2019 01:03   Dg Femur Min 2 Views Right  Result Date: 07/26/2019 CLINICAL DATA:  Fall, bilateral leg pain EXAM: RIGHT FEMUR 2 VIEWS COMPARISON:  None. FINDINGS: Status post right hip hemiarthroplasty. No acetabular component. No evidence of complication. No evidence of fracture or dislocation. Distal femur is unremarkable. IMPRESSION: Status post right hip hemiarthroplasty, without evidence of complication. Electronically Signed   By: Julian Hy M.D.   On: 07/26/2019 01:02     Antimicrobials:   none   Subjective: Patient reported weakness, feeling terrible. Ported left upper extremity weakness.  Unclear whether this is chronic or secondary to pain from fall confounded by rheumatoid arthritis   Objective: Vitals:   07/27/19 0600 07/27/19 0822 07/27/19 1234 07/27/19 1235  BP: (!) 100/56 113/60 108/71   Pulse: 63 71 81 77  Resp: 20 18 16    Temp: (!) 97.4 F (36.3 C)  98.1 F (36.7 C)   TempSrc: Oral  Oral   SpO2: 94%  (!) 80% 96%  Weight: 55.6 kg     Height:        Intake/Output Summary (Last 24 hours) at 07/27/2019 1258 Last data filed at 07/27/2019 4132 Gross per 24 hour  Intake 923.18 ml  Output 600 ml  Net 323.18 ml   Filed Weights   07/25/19 2258 07/27/19 0600  Weight: 55.8 kg 55.6 kg    Examination:  General exam: Labile, reports pain and weakness Respiratory system: Clear to auscultation. Respiratory effort normal. Cardiovascular system: S1 & S2 heard, RRR. No JVD, murmurs, rubs, gallops or clicks. No pedal edema. Gastrointestinal system: Abdomen is nondistended, soft and nontender. No organomegaly or masses  felt. Normal bowel sounds heard. Central nervous system: Alert, . No focal neurological deficits-VERY LIMITED EXAM 2/2 PT DIFFICULTY NOT COMPLYING  INSTRUCTIONS Extremities: moving all 4 ext, ankle contracture c/w RA Skin: No rashes, lesions or ulcers Psychiatry: Judgement and insight appear normal. Mood & affect appropriate.     Data Reviewed: I have personally reviewed following labs and imaging studies  CBC: Recent Labs  Lab 07/25/19 2338 07/27/19 0439  WBC 16.3* 8.3  NEUTROABS 12.9*  --   HGB 13.8 9.2*  HCT 42.3 29.4*  MCV 84.9 89.1  PLT 268 440   Basic Metabolic Panel: Recent Labs  Lab 07/25/19 2338 07/26/19 0825 07/27/19 0439  NA 134* 136 140  K 2.4* 3.0* 3.7  CL 95* 101 113*  CO2 24 22 20*  GLUCOSE 119* 111* 93  BUN 61* 55* 43*  CREATININE 2.76* 2.23* 1.67*  CALCIUM 8.8* 8.0* 7.7*  MG 2.1  --   --    GFR: Estimated Creatinine Clearance: 21.6 mL/min (A) (by C-G formula based on SCr of 1.67 mg/dL (H)). Liver Function Tests: Recent Labs  Lab 07/25/19 2338 07/26/19 0825 07/27/19 0439  AST 150* 109* 76*  ALT 90* 69* 55*  ALKPHOS 85 68 49  BILITOT 0.7 0.5 0.2*  PROT 6.6 5.3* 4.4*  ALBUMIN 3.4* 2.7* 2.2*   No results for input(s): LIPASE, AMYLASE in the last 168 hours. No results for input(s): AMMONIA in the last 168 hours. Coagulation Profile: No results for input(s): INR, PROTIME in the last 168 hours. Cardiac Enzymes: Recent Labs  Lab 07/26/19 0825  CKTOTAL 2,584*  CKMB 34.6*   BNP (last 3 results) No results for input(s): PROBNP in the last 8760 hours. HbA1C: No results for input(s): HGBA1C in the last 72 hours. CBG: No results for input(s): GLUCAP in the last 168 hours. Lipid Profile: No results for input(s): CHOL, HDL, LDLCALC, TRIG, CHOLHDL, LDLDIRECT in the last 72 hours. Thyroid Function Tests: No results for input(s): TSH, T4TOTAL, FREET4, T3FREE, THYROIDAB in the last 72 hours. Anemia Panel: No results for input(s): VITAMINB12,  FOLATE, FERRITIN, TIBC, IRON, RETICCTPCT in the last 72 hours. Sepsis Labs: No results for input(s): PROCALCITON, LATICACIDVEN in the last 168 hours.  Recent Results (from the past 240 hour(s))  SARS Coronavirus 2 (CEPHEID - Performed in [place] hospital lab), Hosp Order     Status: None   Collection Time: 07/26/19  3:02 AM   Specimen: Nasopharyngeal Swab  Result Value Ref Range Status   SARS Coronavirus 2 NEGATIVE NEGATIVE Final    Comment: (NOTE) If result is NEGATIVE SARS-CoV-2 target nucleic acids are NOT DETECTED. The SARS-CoV-2 RNA is generally detectable in upper and lower  respiratory specimens during the acute phase of infection. The lowest  concentration of SARS-CoV-2 viral copies this assay can detect is 250  copies / mL. A negative result does not preclude SARS-CoV-2 infection  and should not be used as the sole basis for treatment or other  patient management decisions.  A negative result may occur with  improper specimen collection / handling, submission of specimen other  than nasopharyngeal swab, presence of viral mutation(s) within the  areas targeted by this assay, and inadequate number of viral copies  (<250 copies / mL). A negative result must be combined with clinical  observations, patient history, and epidemiological information. If result is POSITIVE SARS-CoV-2 target nucleic acids are DETECTED. The SARS-CoV-2 RNA is generally detectable in upper and lower  respiratory specimens dur ing the acute phase of infection.  Positive  results are indicative of active infection with SARS-CoV-2.  Clinical  correlation with patient history and other diagnostic information is  necessary to determine patient infection status.  Positive results do  not rule out bacterial infection or co-infection with other viruses. If result is PRESUMPTIVE POSTIVE SARS-CoV-2 nucleic acids MAY BE PRESENT.   A presumptive positive result was obtained on the submitted specimen  and  confirmed on repeat testing.  While 2019 novel coronavirus  (SARS-CoV-2) nucleic acids may be present in the submitted sample  additional confirmatory testing may be necessary for epidemiological  and / or clinical management purposes  to differentiate between  SARS-CoV-2 and other Sarbecovirus currently known to infect humans.  If clinically indicated additional testing with an alternate test  methodology [phone]) is advised. The SARS-CoV-2 RNA is generally  detectable in upper and lower respiratory sp ecimens during the acute  phase of infection. The expected result is Negative. Fact Sheet for Patients:  StrictlyIdeas.no Fact Sheet for Healthcare Providers: BankingDealers.co.za This test is not yet approved or cleared by the [place] FDA and has been authorized for detection and/or diagnosis of SARS-CoV-2 by FDA under an Emergency Use Authorization (EUA).  This EUA will remain in effect (meaning this test can be used) for the duration of the COVID-19 declaration under Section 564(b)(1) of the Act, 21 U.S.C. section 360bbb-3(b)(1), unless the authorization is terminated or revoked sooner. Performed at [facility], [place] [address], [place], [place] [address]          Radiology Studies: Dg Chest 2 View  Result Date: 07/26/2019 CLINICAL DATA:  Fall EXAM: CHEST - 2 VIEW COMPARISON:  04/22/2018 FINDINGS: Lungs are clear.  No pleural effusion or pneumothorax. The heart is normal in size. S shaped thoracolumbar scoliosis. Posttraumatic deformity involving the left humeral head/neck, chronic. IMPRESSION: No evidence of acute cardiopulmonary disease. Electronically Signed   By: Julian Hy M.D.   On: 07/26/2019 01:00   Dg Pelvis 1-2 Views  Result Date: 07/26/2019 CLINICAL DATA:  Fall, bilateral leg pain EXAM: PELVIS - 1-2 VIEW COMPARISON:  CT pelvis dated 09/10/2016 FINDINGS: No fracture or dislocation is seen.  Visualized bony pelvis appears intact. Right hip arthroplasty. Status post ORIF of a prior intertrochanteric left hip fracture. Degenerative changes the lower lumbar spine. IMPRESSION: Status post right hip arthroplasty. Status post ORIF of a prior intertrochanteric left hip fracture. No acute fracture is seen. Electronically Signed   By: Julian Hy M.D.   On: 07/26/2019 01:01   Dg Wrist Complete Right  Result Date: 07/26/2019 CLINICAL DATA:  Fall, right wrist pain EXAM: RIGHT WRIST - COMPLETE 3+ VIEW COMPARISON:  None. FINDINGS: No fracture or dislocation is seen. The joint spaces are preserved. Visualized soft tissues are within normal limits. IMPRESSION: Negative. Electronically Signed   By: Julian Hy M.D.   On: 07/26/2019 00:59   Dg Ankle Complete Right  Result Date: 07/26/2019 CLINICAL DATA:  85 y.o. female,w hypertension, ? Rheumatoid arthritis (found in epic [place]), osteoporosis, scoliosis, chronic back pain, h/o PE (?? In our medical records in [facility] but can't find CTA chest or in Epic everywhere) , h/o cellulitis. EXAM: RIGHT ANKLE - COMPLETE 3+ VIEW COMPARISON:  None. FINDINGS: There is no evidence of fracture, dislocation, or joint effusion. There is no evidence of arthropathy or other focal bone abnormality. Soft tissues are unremarkable. IMPRESSION: Negative. Electronically Signed   By: Nolon Nations M.D.   On: 07/26/2019 13:38   Ct Head Wo Contrast  Result Date: 07/27/2019 CLINICAL DATA:  Rule out cerebral hemorrhage. Hypertension. Recurrent falls EXAM: CT HEAD WITHOUT CONTRAST TECHNIQUE: Contiguous axial images were obtained from the base of the skull through the vertex without intravenous contrast. COMPARISON:  CT head 01/23/2019 FINDINGS: Brain: Generalized atrophy. Chronic microvascular ischemic changes in the white matter bilaterally. Chronic infarct right cerebellum unchanged. Negative for acute infarct. Negative for hemorrhage or mass. No midline shift. Vascular:  Negative for hyperdense vessel Skull: Negative Sinuses/Orbits: Negative Other: None IMPRESSION: No acute abnormality. Atrophy and chronic ischemic changes as above. Electronically Signed   By: Franchot Gallo M.D.   On: 07/27/2019 12:17   US Abdomen Complete  Result Date: 07/26/2019 CLINICAL DATA:  Abnormal liver function tests. EXAM: ABDOMEN ULTRASOUND COMPLETE COMPARISON:  None. FINDINGS: Gallbladder: Stones almost completely filling the gallbladder. Discrete measurement is difficult but stones measure up to approximately 1.5 cm in diameter. There is no wall thickening or pericholecystic fluid. Common bile duct: Diameter: 0.3 cm Liver: No focal lesion identified. Within normal limits in parenchymal echogenicity. Portal vein is patent on color Doppler imaging with normal direction of blood flow towards the liver. IVC: No abnormality visualized. Pancreas: Visualized portion unremarkable. Spleen:  Size and appearance within normal limits. Right Kidney: Length: 9.4 cm. Echogenicity within normal limits. No mass or hydronephrosis visualized. Left Kidney: Length: 8 cm. Echogenicity within normal limits. No mass or hydronephrosis visualized. Abdominal aorta: No aneurysm visualized. Other findings: None. IMPRESSION: Gallstones without evidence of cholecystitis. The exam is otherwise negative. Electronically Signed   By: Inge Rise M.D.   On: 07/26/2019 10:09   Dg Hip Unilat With Pelvis 2-3 Views Right  Result Date: 07/26/2019 CLINICAL DATA:  85 y.o. female,w hypertension, ? Rheumatoid arthritis (found in epic [facility]), osteoporosis, scoliosis, chronic back pain, h/o PE (?? In our medical records in [facility] but can't find CTA chest or in Epic everywhere), history of cellulitis. EXAM: DG HIP (WITH OR WITHOUT PELVIS) 2-3V RIGHT COMPARISON:  07/26/2019, 01/23/2019 FINDINGS: Status post ORIF of the LEFT hip. Patient has had RIGHT hip arthroplasty. The hardware is intact. No evidence for acute fracture or subluxation.  Degenerative changes are seen in the spine. IMPRESSION: 1. No evidence for acute abnormality. 2. Hardware intact. Electronically Signed   By: Nolon Nations M.D.   On: 07/26/2019 13:41   Dg Femur Min 2 Views Left  Result Date: 07/26/2019 CLINICAL DATA:  Fall, bilateral leg pain EXAM: LEFT FEMUR 2 VIEWS COMPARISON:  Intraoperative left femur radiographs dated 02/17/2017 FINDINGS: Status post ORIF of an intertrochanteric left hip fracture. No evidence of hardware loosening or complication. No evidence of acute fracture or dislocation. Left hip joint space is preserved. Distal left femur is unremarkable. IMPRESSION: Status post ORIF of an intertrochanteric left hip fracture, as above, without evidence of complication. No evidence of acute fracture or dislocation. Electronically Signed   By: Julian Hy M.D.   On: 07/26/2019 01:03   Dg Femur Min 2 Views Right  Result Date: 07/26/2019 CLINICAL DATA:  Fall, bilateral leg pain EXAM: RIGHT FEMUR 2 VIEWS COMPARISON:  None. FINDINGS: Status post right hip hemiarthroplasty. No acetabular component. No evidence of complication. No evidence of fracture or dislocation. Distal femur is unremarkable. IMPRESSION: Status post right hip hemiarthroplasty, without evidence of complication. Electronically Signed   By: Julian Hy M.D.   On: 07/26/2019 01:02        Scheduled Meds:  aspirin EC  81 mg Oral Daily   enoxaparin (LOVENOX) injection  30 mg Subcutaneous Q24H   escitalopram  5 mg Oral Daily   Continuous Infusions:   LOS: 1 day    Time spent: 35 min    Nicolette Bang, MD [facility]  If 7PM-7AM, please contact night-coverage  07/27/2019, 12:58 PM

## 2019-07-28 ENCOUNTER — Inpatient Hospital Stay (HOSPITAL_COMMUNITY): Payer: Medicare Other

## 2019-07-28 DIAGNOSIS — I639 Cerebral infarction, unspecified: Secondary | ICD-10-CM

## 2019-07-28 LAB — CBC WITH DIFFERENTIAL/PLATELET
Abs Immature Granulocytes: 0.03 10*3/uL (ref 0.00–0.07)
Basophils Absolute: 0.1 10*3/uL (ref 0.0–0.1)
Basophils Relative: 1 %
Eosinophils Absolute: 0.4 10*3/uL (ref 0.0–0.5)
Eosinophils Relative: 5 %
HCT: 35.9 % — ABNORMAL LOW (ref 36.0–46.0)
Hemoglobin: 11 g/dL — ABNORMAL LOW (ref 12.0–15.0)
Immature Granulocytes: 0 %
Lymphocytes Relative: 21 %
Lymphs Abs: 1.9 10*3/uL (ref 0.7–4.0)
MCH: 27.9 pg (ref 26.0–34.0)
MCHC: 30.6 g/dL (ref 30.0–36.0)
MCV: 91.1 fL (ref 80.0–100.0)
Monocytes Absolute: 0.7 10*3/uL (ref 0.1–1.0)
Monocytes Relative: 7 %
Neutro Abs: 5.9 10*3/uL (ref 1.7–7.7)
Neutrophils Relative %: 66 %
Platelets: 234 10*3/uL (ref 150–400)
RBC: 3.94 MIL/uL (ref 3.87–5.11)
RDW: 14 % (ref 11.5–15.5)
WBC: 8.9 10*3/uL (ref 4.0–10.5)
nRBC: 0 % (ref 0.0–0.2)

## 2019-07-28 LAB — LIPID PANEL
Cholesterol: 113 mg/dL (ref 0–200)
HDL: 38 mg/dL — ABNORMAL LOW (ref >40)
LDL Cholesterol: 58 mg/dL (ref 0–99)
Total CHOL/HDL Ratio: 3 RATIO
Triglycerides: 85 mg/dL (ref <150)
VLDL: 17 mg/dL (ref 0–40)

## 2019-07-28 LAB — COMPREHENSIVE METABOLIC PANEL
ALT: 52 U/L — ABNORMAL HIGH (ref 0–44)
AST: 52 U/L — ABNORMAL HIGH (ref 15–41)
Albumin: 2.5 g/dL — ABNORMAL LOW (ref 3.5–5.0)
Alkaline Phosphatase: 54 U/L (ref 38–126)
Anion gap: 7 (ref 5–15)
BUN: 28 mg/dL — ABNORMAL HIGH (ref 8–23)
CO2: 20 mmol/L — ABNORMAL LOW (ref 22–32)
Calcium: 8.1 mg/dL — ABNORMAL LOW (ref 8.9–10.3)
Chloride: 117 mmol/L — ABNORMAL HIGH (ref 98–111)
Creatinine, Ser: 1.12 mg/dL — ABNORMAL HIGH (ref 0.44–1.00)
GFR calc Af Amer: 52 mL/min — ABNORMAL LOW (ref >60)
GFR calc non Af Amer: 45 mL/min — ABNORMAL LOW (ref >60)
Glucose, Bld: 83 mg/dL (ref 70–99)
Potassium: 4 mmol/L (ref 3.5–5.1)
Sodium: 144 mmol/L (ref 135–145)
Total Bilirubin: 0.6 mg/dL (ref 0.3–1.2)
Total Protein: 4.9 g/dL — ABNORMAL LOW (ref 6.5–8.1)

## 2019-07-28 LAB — ECHOCARDIOGRAM COMPLETE
Height: 66 in
Weight: 1910.4 oz

## 2019-07-28 LAB — HEMOGLOBIN A1C
Hgb A1c MFr Bld: 5.9 % — ABNORMAL HIGH (ref 4.8–5.6)
Mean Plasma Glucose: 122.63 mg/dL

## 2019-07-28 LAB — CK: Total CK: 547 U/L — ABNORMAL HIGH (ref 38–234)

## 2019-07-28 MED ORDER — LACTATED RINGERS IV SOLN
INTRAVENOUS | Status: AC
  Administered 2019-07-28 (×2): via INTRAVENOUS
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
## 2019-07-28 NOTE — Evaluation (Signed)
Clinical/Bedside Swallow Evaluation Patient Details  Name: Angelica Bell MRN: [id number] Date of Birth: 10/08/1932  Today's Date: 07/28/2019 Time: SLP Start Time (ACUTE ONLY): 1205 SLP Stop Time (ACUTE ONLY): 1219 SLP Time Calculation (min) (ACUTE ONLY): 14 min  Past Medical History:  Past Medical History:  Diagnosis Date  . Chronic back pain    with degenerative joint disease  . Chronic pain disorder    with narcotic management  . Dry eyes   . Hypertension   . Osteoporosis   . PE (pulmonary embolism)    After hysterectomy in 1967  . Pneumonia    Past Surgical History:  Past Surgical History:  Procedure Laterality Date  . ABDOMINAL HYSTERECTOMY    . APPENDECTOMY    . BREAST ENHANCEMENT SURGERY    . CATARACT EXTRACTION    . FEMUR IM NAIL Left 02/17/2017   Procedure: INTRAMEDULLARY (IM) NAIL FEMORAL;  Surgeon: Nicholes Stairs, MD;  Location: [facility];  Service: Orthopedics;  Laterality: Left;  . HIP ARTHROPLASTY Right 12/24/2015   Procedure: ARTHROPLASTY BIPOLAR HIP (HEMIARTHROPLASTY);  Surgeon: Netta Cedars, MD;  Location: [facility];  Service: Orthopedics;  Laterality: Right;  . TONSILLECTOMY     HPI:  Pt is an 85 yo female admitted after fall, she stays in bed 14 hours a day per her son.  LUE weakness. MRI brain 07/27/2019  Background pattern of small-vessel disease with old small vessel disease.  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   Please reorder for cognitive linguistic evaluation.    Assessment / Plan / Recommendation Clinical Impression  Pt with negative CN exam except for minimal right facial asymmetry from tasks pt would participate in.  She was quite tearful and difficult to reorient to task.   No indication of aspiration nor oropharyngeal residuals apparent with intake *graham cracker, puree, thin.  She did not pass the Yale 3 ounce water test due to requiring rest break.   Recommend pt continue regular/thin diet with general  precautions.  Advise pt have cognitive linguistic evaluation given she lives alone and has a new occipital cva.  Educated pt to recommendations and advised to general precautions.  Pt was tearful and asked if she would want to speak to a chaplain but she politely declined. SLP Visit Diagnosis: Dysphagia, unspecified (R13.10)    Aspiration Risk  Mild aspiration risk    Diet Recommendation Regular;Thin liquid   Liquid Administration via: Straw;Cup Medication Administration: Whole meds with liquid Supervision: Patient able to self feed Compensations: Slow rate;Small sips/bites Postural Changes: Seated upright at 90 degrees;Remain upright for at least 30 minutes after po intake    Other  Recommendations Oral Care Recommendations: Oral care BID   Follow up Recommendations None      Frequency and Duration     n/a       Prognosis   n/a     Swallow Study   General Date of Onset: 07/28/19 HPI: Pt is an 85 yo female admitted after fall, she stays in bed 14 hours a day per her son.  LUE weakness. MRI brain 07/27/2019  Background pattern of small-vessel disease with old small vessel disease.  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 Type of Study: Bedside Swallow Evaluation Diet Prior to this Study: Regular;Thin liquids Temperature Spikes Noted: No Respiratory Status: Room air History of Recent Intubation: No Behavior/Cognition: Cooperative;Pleasant mood;Alert Oral Care Completed by SLP: No  Oral Cavity - Dentition: Dentures, top;Dentures, bottom Vision: Functional for self-feeding Self-Feeding Abilities: Able to feed self Patient Positioning: Upright in bed Baseline Vocal Quality: Normal Volitional Cough: Strong Volitional Swallow: Able to elicit    Oral/Motor/Sensory Function Overall Oral Motor/Sensory Function: Mild impairment Facial Symmetry: Abnormal symmetry right Lingual ROM: Within Functional Limits Lingual Symmetry: Within Functional  Limits Lingual Strength: Within Functional Limits Velum: Within Functional Limits Mandible: (dnt)   Ice Chips Ice chips: Not tested   Thin Liquid Thin Liquid: Within functional limits Presentation: Straw Other Comments: pt did not pass the 3 ounce water test due to requiring rest break but swallow clinically appeared timely and without indication of residuals    Nectar Thick Nectar Thick Liquid: Not tested   Honey Thick Honey Thick Liquid: Not tested   Puree Puree: Within functional limits Presentation: Spoon   Solid     Solid: Within functional limits      Macario Golds 07/28/2019,12:23 PM   Luanna Salk, [place] [facility] SLP [place] Pager [phone] Office [phone]

## 2019-07-28 NOTE — Progress Notes (Signed)
  Echocardiogram 2D Echocardiogram has been performed.  Angelica Bell 07/28/2019, 11:50 AM

## 2019-07-28 NOTE — Progress Notes (Signed)
PT Cancellation Note  Patient Details Name: Angelica Bell MRN: [id number] DOB: 13-May-1932   Cancelled Treatment:    Reason Eval/Treat Not Completed: Other (comment) Pt with plan for transfer to [facility] for neuro work up.  Pt not participating in care this morning per RN.  Per MD notes: "Apparently patient remains in bed for 10 to 14 hours a day has chronic weakness, is minimally interactive with family and has appeared to "given up"."  Pt may benefit from palliative consult for goals of care.  MD please order if agree. Thanks.  PT to check back as schedule permits.   Maxwell Martorano,KATHrine E 07/28/2019, 10:58 AM Carmelia Bake, PT, DPT Acute Rehabilitation Services Office: [phone] Pager: [phone]
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
## 2019-07-29 ENCOUNTER — Inpatient Hospital Stay (HOSPITAL_COMMUNITY): Payer: Medicare Other

## 2019-07-29 DIAGNOSIS — I639 Cerebral infarction, unspecified: Secondary | ICD-10-CM

## 2019-07-29 LAB — CBC
HCT: 33.6 % — ABNORMAL LOW (ref 36.0–46.0)
Hemoglobin: 10.3 g/dL — ABNORMAL LOW (ref 12.0–15.0)
MCH: 27.5 pg (ref 26.0–34.0)
MCHC: 30.7 g/dL (ref 30.0–36.0)
MCV: 89.8 fL (ref 80.0–100.0)
Platelets: 206 10*3/uL (ref 150–400)
RBC: 3.74 MIL/uL — ABNORMAL LOW (ref 3.87–5.11)
RDW: 14.1 % (ref 11.5–15.5)
WBC: 7.6 10*3/uL (ref 4.0–10.5)
nRBC: 0 % (ref 0.0–0.2)

## 2019-07-29 LAB — COMPREHENSIVE METABOLIC PANEL
ALT: 42 U/L (ref 0–44)
AST: 35 U/L (ref 15–41)
Albumin: 2.2 g/dL — ABNORMAL LOW (ref 3.5–5.0)
Alkaline Phosphatase: 53 U/L (ref 38–126)
Anion gap: 5 (ref 5–15)
BUN: 18 mg/dL (ref 8–23)
CO2: 22 mmol/L (ref 22–32)
Calcium: 8.1 mg/dL — ABNORMAL LOW (ref 8.9–10.3)
Chloride: 116 mmol/L — ABNORMAL HIGH (ref 98–111)
Creatinine, Ser: 0.91 mg/dL (ref 0.44–1.00)
GFR calc Af Amer: >60 mL/min (ref >60)
GFR calc non Af Amer: 58 mL/min — ABNORMAL LOW (ref >60)
Glucose, Bld: 96 mg/dL (ref 70–99)
Potassium: 3.5 mmol/L (ref 3.5–5.1)
Sodium: 143 mmol/L (ref 135–145)
Total Bilirubin: 0.4 mg/dL (ref 0.3–1.2)
Total Protein: 4.4 g/dL — ABNORMAL LOW (ref 6.5–8.1)

## 2019-07-29 LAB — HEPATITIS PANEL, ACUTE
HCV Ab: 0.1 s/co ratio (ref 0.0–0.9)
Hep A IgM: NEGATIVE
Hep B C IgM: NEGATIVE
Hepatitis B Surface Ag: NEGATIVE

## 2019-07-29 LAB — CK: Total CK: 268 U/L — ABNORMAL HIGH (ref 38–234)

## 2019-07-29 LAB — MAGNESIUM: Magnesium: 1.4 mg/dL — ABNORMAL LOW (ref 1.7–2.4)

## 2019-07-29 MED ORDER — HYDRALAZINE HCL 20 MG/ML IJ SOLN
10.0000 mg | INTRAMUSCULAR | Status: DC | PRN
  Administered 2019-07-29: 10 mg via INTRAVENOUS
  Filled 2019-07-29: qty 1

## 2019-07-29 MED ORDER — MAGNESIUM SULFATE 2 GM/50ML IV SOLN: 2.0000 g | Freq: Once | INTRAVENOUS | Status: DC

## 2019-07-29 NOTE — Progress Notes (Signed)
SLP Cancellation Note  Patient Details Name: [name] MRN: [id number] DOB: [birth year]   Cancelled treatment:    Orders received for repeat swallow evaluation.  Pt performed adequately per yesterday's exam, diet was recommended and SLP signed off, however today pt reported difficulty swallowing to her nurse.  Upon arrival to room, pt refused all POs offered to her, continuing to say, "No, I am cold" and ultimately "get out of here. I am cold." An additional blanket was placed over her prior to departure.  D/W Rn.  Considering yesterday's results, recommend continuing current diet.  SLP will attempt to f/u again next date.       Kaiden Pech L. Tivis Ringer, [place] Office number [phone] Pager [phone]  Juan Quam Laurice 07/29/2019, 3:02 PM

## 2019-07-29 NOTE — Progress Notes (Signed)
This RN gave report to receiving RN @ [place]. [facility] Room 32C.   [facility] was notified to transfer the patient to [facility].

## 2019-07-29 NOTE — Progress Notes (Signed)
BP: 180/142. Notified MD for BP parameters and PRN BP med. Administered Apresoline, will continue to monitor

## 2019-07-29 NOTE — Progress Notes (Signed)
Pt transferred to [facility] via [facility] before I was able to see her. I was unable to see patient this AM, but gave sign out to receiving physician. Neurology should be paged on arrival.   See yesterdays PN for Angelica Bell&p.

## 2019-07-29 NOTE — Progress Notes (Signed)
PROGRESS NOTE    Angelica Bell  IFO:[id number] DOB: 1932-11-09 DOA: 07/25/2019 PCP: Heywood Bene, PA-C   Brief Narrative:  Per HPI and previous PN Angelica Bell,whypertension, ? Rheumatoid arthritis (found in epic [facility]), osteoporosis, scoliosis, chronic back pain, h/o PE (?? In our medical records in [facility] but can't find CTA chest or in Epic everywhere) , h/o cellulitis, presents with c/o failure to thrive, recurrent falls at home. Pt states that her poor po intake is due to not being able to get food. She was admitted with failure to thrive and recurrent falls.  She was noted to have LUE weakness on 7/28 and MRI was performed notable for acute scattered small infarctions.  Neurology was c/s who requested transfer to [facility].  Please notify neurology on pt arrival to [facility].   Assessment & Plan:   Principal Problem:   ARF (acute renal failure) (HCC) Active Problems:   Hypokalemia   Essential hypertension   Hyponatremia   Abnormal liver function   Cerebrovascular accident (CVA) ([place])  Acute Stroke  LUE Weakness:  CVA with evidence of acute small infarctions scattered within the white matter of the cerebral hemispheres (see report - favored hypoperfusion/watershed, embolic possible). Neurology following - appreciate insight and recs - requesting MRA/CTA head/neck PT/OT/SLP continue to follow - currently NPO due to poor ability to interact with speech Echo without overt PFO or thrombus Carotid dopplers with 1-39% stenosis bilaterally with antegrade flow in bilateral vertebral arteries Aspirin, lipitor  LDL 58, HDL 38. A1c pending.  Failure to thrive, ongoing:  Case was discussed with pt grandson.  Apparently patient remains in bed for 10 to 14 hours a day has chronic weakness, is minimally interactive with family and has appeared to have "given up" per previous discussions. PT/OT Social work    Acute renal failure  Elevated CK, mild rhabdo  NAGMA,  resolved Baseline creatinine ~1.  Peaked at 2.76. - now 0.9 CK elevated in this pt found on floor for about 12 hours, will trend with IVF given poor PO status  Hypokalemia, severe, resolved Repleted previously  Continue to monitor  Abnormal liver function, resolved Improving with hydration Acute hepatitis panel unremarkable Ck elevated CMP in AM  Hypertension Monitor bp  Chronic back pain Cont oxycodone   DVT prophylaxis: lovenox Code Status: full Family Communication: discussed with grandson Disposition Plan: pending further workup and clinical improvement  Consultants:   neurology   Subjective: No acute issues or events overnight reported, patient continues to be poorly interactive, replying "cold" to all questions or prompts.  Poorly interactive with exam, not following commands  Objective: Vitals:   07/28/19 1420 07/28/19 2058 07/29/19 0517 07/29/19 0800  BP: 105/62 (!) 157/77 (!) 152/87 (!) 165/71  Pulse: 70 77 76 70  Resp: 20 18 18 18   Temp: 98.2 F (36.8 C) 98.7 F (37.1 C) 98.1 F (36.7 C) 98.7 F (37.1 C)  TempSrc: Oral Oral Oral Axillary  SpO2: 98% 98% 95% 98%  Weight:      Height:        Intake/Output Summary (Last 24 hours) at 07/29/2019 1323 Last data filed at 07/29/2019 0500 Gross per 24 hour  Intake 1787.81 ml  Output 450 ml  Net 1337.81 ml   Filed Weights   07/25/19 2258 07/27/19 0600 07/28/19 0523  Weight: 55.8 kg 55.6 kg 54.2 kg    Examination:  General:  Pleasantly resting in bed, No acute distress.  Drowsy, easily arousable but follows commands poorly, essentially nonverbal  other than the word "cold" HEENT:  Normocephalic atraumatic.  Sclerae nonicteric, noninjected.  Extraocular movements intact bilaterally. Neck:  Without mass or deformity.  Trachea is midline. Lungs:  Clear to auscultate bilaterally without rhonchi, wheeze, or rales. Heart:  Regular rate and rhythm.  Without murmurs, rubs, or gallops. Abdomen:  Soft,  nontender, nondistended.  Without guarding or rebound. Extremities: Without cyanosis, clubbing, edema, or obvious deformity. Vascular:  Dorsalis pedis and posterior tibial pulses palpable bilaterally. Skin:  Warm and dry, no erythema, no ulcerations.  Data Reviewed: I have personally reviewed following labs and imaging studies  CBC: Recent Labs  Lab 07/25/19 2338 07/27/19 0439 07/28/19 0544 07/29/19 0527  WBC 16.3* 8.3 8.9 7.6  NEUTROABS 12.9*  --  5.9  --   HGB 13.8 9.2* 11.0* 10.3*  HCT 42.3 29.4* 35.9* 33.6*  MCV 84.9 89.1 91.1 89.8  PLT 268 194 234 101   Basic Metabolic Panel: Recent Labs  Lab 07/25/19 2338 07/26/19 0825 07/27/19 0439 07/28/19 0544 07/29/19 0527  NA 134* 136 140 144 143  K 2.4* 3.0* 3.7 4.0 3.5  CL 95* 101 113* 117* 116*  CO2 24 22 20* 20* 22  GLUCOSE 119* 111* 93 83 96  BUN 61* 55* 43* 28* 18  CREATININE 2.76* 2.23* 1.67* 1.12* 0.91  CALCIUM 8.8* 8.0* 7.7* 8.1* 8.1*  MG 2.1  --   --   --  1.4*   GFR: Estimated Creatinine Clearance: 38.7 mL/min (by C-G formula based on SCr of 0.91 mg/dL). Liver Function Tests: Recent Labs  Lab 07/25/19 2338 07/26/19 0825 07/27/19 0439 07/28/19 0544 07/29/19 0527  AST 150* 109* 76* 52* 35  ALT 90* 69* 55* 52* 42  ALKPHOS 85 68 49 54 53  BILITOT 0.7 0.5 0.2* 0.6 0.4  PROT 6.6 5.3* 4.4* 4.9* 4.4*  ALBUMIN 3.4* 2.7* 2.2* 2.5* 2.2*   No results for input(s): LIPASE, AMYLASE in the last 168 hours. No results for input(s): AMMONIA in the last 168 hours. Coagulation Profile: No results for input(s): INR, PROTIME in the last 168 hours. Cardiac Enzymes: Recent Labs  Lab 07/26/19 0825 07/27/19 1343 07/27/19 2019 07/28/19 0544 07/29/19 0527  CKTOTAL 2,584* 1,367* 832* 547* 268*  CKMB 34.6*  --   --   --   --    HbA1C: Recent Labs    07/28/19 0544  HGBA1C 5.9*   Lipid Profile: Recent Labs    07/28/19 0544  CHOL 113  HDL 38*  LDLCALC 58  TRIG 85  CHOLHDL 3.0   Recent Results (from the past  240 hour(s))  SARS Coronavirus 2 (CEPHEID - Performed in [place] hospital lab), Hosp Order     Status: None   Collection Time: 07/26/19  3:02 AM   Specimen: Nasopharyngeal Swab  Result Value Ref Range Status   SARS Coronavirus 2 NEGATIVE NEGATIVE Final    Comment: (NOTE) If result is NEGATIVE SARS-CoV-2 target nucleic acids are NOT DETECTED. The SARS-CoV-2 RNA is generally detectable in upper and lower  respiratory specimens during the acute phase of infection. The lowest  concentration of SARS-CoV-2 viral copies this assay can detect is 250  copies / mL. A negative result does not preclude SARS-CoV-2 infection  and should not be used as the sole basis for treatment or other  patient management decisions.  A negative result may occur with  improper specimen collection / handling, submission of specimen other  than nasopharyngeal swab, presence of viral mutation(s) within the  areas targeted by this  assay, and inadequate number of viral copies  (<250 copies / mL). A negative result must be combined with clinical  observations, patient history, and epidemiological information. If result is POSITIVE SARS-CoV-2 target nucleic acids are DETECTED. The SARS-CoV-2 RNA is generally detectable in upper and lower  respiratory specimens dur ing the acute phase of infection.  Positive  results are indicative of active infection with SARS-CoV-2.  Clinical  correlation with patient history and other diagnostic information is  necessary to determine patient infection status.  Positive results do  not rule out bacterial infection or co-infection with other viruses. If result is PRESUMPTIVE POSTIVE SARS-CoV-2 nucleic acids MAY BE PRESENT.   A presumptive positive result was obtained on the submitted specimen  and confirmed on repeat testing.  While 2019 novel coronavirus  (SARS-CoV-2) nucleic acids may be present in the submitted sample  additional confirmatory testing may be necessary for  epidemiological  and / or clinical management purposes  to differentiate between  SARS-CoV-2 and other Sarbecovirus currently known to infect humans.  If clinically indicated additional testing with an alternate test  methodology [phone]) is advised. The SARS-CoV-2 RNA is generally  detectable in upper and lower respiratory sp ecimens during the acute  phase of infection. The expected result is Negative. Fact Sheet for Patients:  StrictlyIdeas.no Fact Sheet for Healthcare Providers: BankingDealers.co.za This test is not yet approved or cleared by the [place] FDA and has been authorized for detection and/or diagnosis of SARS-CoV-2 by FDA under an Emergency Use Authorization (EUA).  This EUA will remain in effect (meaning this test can be used) for the duration of the COVID-19 declaration under Section 564(b)(1) of the Act, 21 U.S.C. section 360bbb-3(b)(1), unless the authorization is terminated or revoked sooner. Performed at [facility], [place] [address], [place], [place] [address]          Radiology Studies: Mr Brain Wo Contrast  Result Date: 07/27/2019 CLINICAL DATA:  Worsening confusion over the last few months. Ataxia. EXAM: MRI HEAD WITHOUT CONTRAST TECHNIQUE: Multiplanar, multiecho pulse sequences of the brain and surrounding structures were obtained without intravenous contrast. COMPARISON:  CT same day FINDINGS: Brain: There is an old small vessel infarction in the right cerebellum. Mild chronic small-vessel ischemic change affects the pons. There is a background pattern of chronic small vessel disease throughout the cerebral hemispheric white matter. There is scattered foci of acute infarction noted, within the right frontoparietal vertex deep white matter, the left occipital cortical brain and subcortical white matter, and the left parietal white matter. Pattern is most consistent with hypoperfusion  infarctions/watershed infarctions. Micro embolic infarctions are possible, but I would expect more cortical involvement. The differential diagnosis does include other white matter processes such as ADEM and inflammatory processes such as Lyme disease. No sign of mass lesion, hemorrhage, hydrocephalus or extra-axial collection. Vascular: Major vessels at the base of the brain show flow. Skull and upper cervical spine: Negative Sinuses/Orbits: Clear/normal Other: None IMPRESSION: Background pattern of small-vessel disease with old small vessel right cerebellar infarction and widespread white matter changes. Scattered acute small infarctions primarily within the white matter of the cerebral hemispheres but with some cortical involvement particularly in the left occipital region. Favored diagnosis is hypoperfusion or watershed infarctions. Embolic infarctions are possible. Other processes including ADEM and Lyme disease could give this appearance but seem unlikely. Electronically Signed   By: Nelson Chimes M.D.   On: 07/27/2019 19:46   Vas US Carotid  Result Date: 07/29/2019 Carotid Arterial  Duplex Study Indications:       CVA. Risk Factors:      Hypertension. Comparison Study:  prior study done 04/07/2004 Performing Technologist: Abram Sander RVS  Examination Guidelines: A complete evaluation includes B-mode imaging, spectral Doppler, color Doppler, and power Doppler as needed of all accessible portions of each vessel. Bilateral testing is considered an integral part of a complete examination. Limited examinations for reoccurring indications may be performed as noted.  Right Carotid Findings: +----------+--------+--------+--------+-----------+--------+           PSV cm/sEDV cm/sStenosisDescribe   Comments +----------+--------+--------+--------+-----------+--------+ CCA Prox  51      12              homogeneous         +----------+--------+--------+--------+-----------+--------+ CCA Distal50      14               homogeneous         +----------+--------+--------+--------+-----------+--------+ ICA Prox  65      23      1-39%   homogeneous         +----------+--------+--------+--------+-----------+--------+ ICA Distal87      32                                  +----------+--------+--------+--------+-----------+--------+ ECA       92                                          +----------+--------+--------+--------+-----------+--------+ +----------+--------+-------+--------+-------------------+           PSV cm/sEDV cmsDescribeArm Pressure (mmHG) +----------+--------+-------+--------+-------------------+ [id number]                                         +----------+--------+-------+--------+-------------------+ +---------+--------+--+--------+--+---------+ VertebralPSV cm/s39EDV cm/s14Antegrade +---------+--------+--+--------+--+---------+  Left Carotid Findings: +----------+--------+--------+--------+-----------+--------+           PSV cm/sEDV cm/sStenosisDescribe   Comments +----------+--------+--------+--------+-----------+--------+ CCA Prox  90      23              homogeneous         +----------+--------+--------+--------+-----------+--------+ CCA Distal45      18              homogeneous         +----------+--------+--------+--------+-----------+--------+ ICA Prox  122     33      1-39%   homogeneoustortuous +----------+--------+--------+--------+-----------+--------+ ICA Distal80      35                                  +----------+--------+--------+--------+-----------+--------+ ECA       79                                          +----------+--------+--------+--------+-----------+--------+ +----------+--------+--------+--------+-------------------+ SubclavianPSV cm/sEDV cm/sDescribeArm Pressure (mmHG) +----------+--------+--------+--------+-------------------+           90                                           +----------+--------+--------+--------+-------------------+ +---------+--------+--+--------+--+---------+  VertebralPSV cm/s57EDV cm/s19Antegrade +---------+--------+--+--------+--+---------+  Summary: Right Carotid: Velocities in the right ICA are consistent with a 1-39% stenosis. Left Carotid: Velocities in the left ICA are consistent with a 1-39% stenosis. Vertebrals: Bilateral vertebral arteries demonstrate antegrade flow. *See table(s) above for measurements and observations.  Electronically signed by Antony Contras MD on 07/29/2019 at 8:31:13 AM.    Final    Scheduled Meds: . aspirin EC  81 mg Oral Daily  . atorvastatin  10 mg Oral q1800  . enoxaparin (LOVENOX) injection  30 mg Subcutaneous Q24H  . escitalopram  5 mg Oral Daily   Continuous Infusions: . magnesium sulfate bolus IVPB       LOS: 3 days   Time spent: over 30 min   Little Ishikawa, MD [facility] Pager AMION  If 7PM-7AM, please contact night-coverage [email or web address] Password [email or web address] 07/29/2019, 1:23 PM
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
## 2019-07-29 NOTE — Progress Notes (Signed)
[name], patient's daughter was notified by voicemail that the patient is being transported to [facility].

## 2019-07-29 NOTE — Care Management Important Message (Signed)
Important Message  Patient Details  Name: Angelica Bell MRN: [id number] Date of Birth: [birth year]   Medicare Important Message Given:  Yes     Orbie Pyo 07/29/2019, 12:34 PM

## 2019-07-29 NOTE — Progress Notes (Signed)
Patient has a bed at [facility]. [address] , [address] [place]. Attempted to call report to receiving RN but was not able to do so at this time. Will attempt to call report shortly.

## 2019-07-30 ENCOUNTER — Inpatient Hospital Stay (HOSPITAL_COMMUNITY): Payer: Medicare Other

## 2019-07-30 DIAGNOSIS — I6389 Other cerebral infarction: Secondary | ICD-10-CM

## 2019-07-30 LAB — COMPREHENSIVE METABOLIC PANEL
ALT: 51 U/L — ABNORMAL HIGH (ref 0–44)
AST: 46 U/L — ABNORMAL HIGH (ref 15–41)
Albumin: 2.6 g/dL — ABNORMAL LOW (ref 3.5–5.0)
Alkaline Phosphatase: 74 U/L (ref 38–126)
Anion gap: 15 (ref 5–15)
BUN: 12 mg/dL (ref 8–23)
CO2: 23 mmol/L (ref 22–32)
Calcium: 8.8 mg/dL — ABNORMAL LOW (ref 8.9–10.3)
Chloride: 106 mmol/L (ref 98–111)
Creatinine, Ser: 1.04 mg/dL — ABNORMAL HIGH (ref 0.44–1.00)
GFR calc Af Amer: 57 mL/min — ABNORMAL LOW (ref >60)
GFR calc non Af Amer: 49 mL/min — ABNORMAL LOW (ref >60)
Glucose, Bld: 92 mg/dL (ref 70–99)
Potassium: 3.3 mmol/L — ABNORMAL LOW (ref 3.5–5.1)
Sodium: 144 mmol/L (ref 135–145)
Total Bilirubin: 1 mg/dL (ref 0.3–1.2)
Total Protein: 5.1 g/dL — ABNORMAL LOW (ref 6.5–8.1)

## 2019-07-30 LAB — CBC
HCT: 40.4 % (ref 36.0–46.0)
Hemoglobin: 13.3 g/dL (ref 12.0–15.0)
MCH: 28.3 pg (ref 26.0–34.0)
MCHC: 32.9 g/dL (ref 30.0–36.0)
MCV: 86 fL (ref 80.0–100.0)
Platelets: 297 10*3/uL (ref 150–400)
RBC: 4.7 MIL/uL (ref 3.87–5.11)
RDW: 13.9 % (ref 11.5–15.5)
WBC: 10.2 10*3/uL (ref 4.0–10.5)
nRBC: 0 % (ref 0.0–0.2)

## 2019-07-30 LAB — MAGNESIUM: Magnesium: 1.3 mg/dL — ABNORMAL LOW (ref 1.7–2.4)
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
## 2019-07-30 NOTE — NC FL2 (Addendum)
[place] MEDICAID FL2 LEVEL OF CARE SCREENING TOOL     IDENTIFICATION  Patient Name: Angelica Bell Birthdate: Dec 25, 1932 Sex: female Admission Date (Current Location): 07/25/2019  [facility] [place] Number:  Herbalist and Address:  [facility] [place] [address], [place], [place] [address]      Provider Number: [id number]  Attending Physician Name and Address:  Little Ishikawa, MD  Relative Name and Phone Number:  Leeanne Rio Crenshaw, Daughter, [phone]    Current Level of Care: Hospital Recommended Level of Care: [place] Prior Approval Number:    Date Approved/Denied:   PASRR Number: [id number]  Discharge Plan: SNF    Current Diagnoses: Patient Active Problem List   Diagnosis Date Noted  . Cerebrovascular accident (CVA) ([place])   . ARF (acute renal failure) ([place]) 07/26/2019  . Hyponatremia 07/26/2019  . Abnormal liver function 07/26/2019  . Peripheral neuropathy   . Drug overdose   . Polypharmacy   . Altered mental status 10/25/2017  . Closed displaced intertrochanteric fracture of left femur ([place]) 02/16/2017  . Hip fracture, right ([place]) 12/25/2015  . Chronic pain 12/24/2015  . Fracture of femoral neck, right, closed ([place]) 12/24/2015  . Essential hypertension 12/24/2015  . Community acquired pneumonia 04/15/2013  . Hypokalemia 04/15/2013  . Labial cyst 02/08/2012  . POSTMENOPAUSAL SYNDROME 11/28/2009  . PERIPHERAL NEUROPATHY, LOWER EXTREMITY, RIGHT 02/22/2009  . INSOMNIA-SLEEP DISORDER-UNSPEC 10/12/2008  . Lumbago 08/02/2008  . 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 ANXIETY DEPRESSION 12/16/2007  . HYPERLIPIDEMIA 07/08/2007  . Osteoarthritis 07/08/2007  . Osteoporosis 07/08/2007  . SCOLIOSIS NEC 07/08/2007    Orientation RESPIRATION BLADDER Height & Weight     Self, Situation, Place  Normal Incontinent, External catheter Weight: 119 lb 6.4 oz  (54.2 kg) Height:  5\' 6"  (167.6 cm)  BEHAVIORAL SYMPTOMS/MOOD NEUROLOGICAL BOWEL NUTRITION STATUS      Continent Diet(Regular diet, thin liquids)  AMBULATORY STATUS COMMUNICATION OF NEEDS Skin   Limited Assist Verbally Skin abrasions, Bruising(dry skin, abrasion on hip, shoulder, thigh; bruising on arm)                       Personal Care Assistance Level of Assistance  Bathing, Feeding, Dressing, Total care Bathing Assistance: Limited assistance Feeding assistance: Independent Dressing Assistance: Limited assistance Total Care Assistance: Limited assistance   Functional Limitations Info  Sight, Hearing, Speech Sight Info: Impaired(Wears glasses) Hearing Info: Adequate Speech Info: Adequate    SPECIAL CARE FACTORS FREQUENCY  PT (By licensed PT), OT (By licensed OT)     PT Frequency: 5x/wk OT Frequency: 5x/wk            Contractures Contractures Info: Not present    Additional Factors Info  Code Status, Allergies Code Status Info: Full Code Allergies Info: Hctz (Hydrochlorothiazide), Amoxicillin-pot Clavulanate, Lisinopril-hydrochlorothiazide, Sulfa Antibiotics, Sulfonamide Derivatives           Current Medications (07/30/2019):  This is the current hospital active medication list Current Facility-Administered Medications  Medication Dose Route Frequency Provider Last Rate Last Dose  . acetaminophen (TYLENOL) tablet 650 mg  650 mg Oral Q6H PRN Elodia Florence., MD   650 mg at 07/30/19 1203   Or  . acetaminophen (TYLENOL) suppository 650 mg  650 mg Rectal Q6H PRN Elodia Florence., MD      . aspirin EC tablet 81 mg  81 mg Oral Daily Elodia Florence., MD  81 mg at 07/30/19 0829  . atorvastatin (LIPITOR) tablet 10 mg  10 mg Oral q1800 Elodia Florence., MD   10 mg at 07/28/19 1739  . enoxaparin (LOVENOX) injection 30 mg  30 mg Subcutaneous Q24H Elodia Florence., MD   30 mg at 07/30/19 0845  . escitalopram (LEXAPRO) tablet 5 mg  5 mg  Oral Daily Elodia Florence., MD   5 mg at 07/30/19 6195  . hydrALAZINE (APRESOLINE) injection 10 mg  10 mg Intravenous Q4H PRN Little Ishikawa, MD   10 mg at 07/29/19 1551  . LORazepam (ATIVAN) injection 0.5 mg  0.5 mg Intravenous Q4H PRN Elodia Florence., MD   0.5 mg at 07/29/19 1709  . magnesium sulfate IVPB 2 g 50 mL  2 g Intravenous Once Elodia Florence., MD      . oxyCODONE (Oxy IR/ROXICODONE) immediate release tablet 30 mg  30 mg Oral TID PRN Elodia Florence., MD   30 mg at 07/30/19 1244     Discharge Medications: Please see discharge summary for a list of discharge medications.  Relevant Imaging Results:  Relevant Lab Results:   Additional Information SSN: [id number]  Philippa Chester Mykelti Goldenstein, LCSWA

## 2019-07-30 NOTE — Progress Notes (Addendum)
Stroke Team Progress Note  Interval history: Patient is resting but easily arousable. She is frail, bruised from previous falls, and appears tired. Speech is clear and affect is depressed.   OBJECTIVE Most recent Vital Signs: Vitals:   07/30/19 0344 07/30/19 0345 07/30/19 0800 07/30/19 1250  BP: (!) 164/97 (!) 164/97 133/69 125/67  Pulse: 98 98 99 (!) 103  Resp:  (!) 21  16  Temp: 98.8 F (37.1 C) 98.8 F (37.1 C) 98.2 F (36.8 C) 98.1 F (36.7 C)  TempSrc:  Oral Oral Oral  SpO2: 98% 98% 96% 95%  Weight:      Height:       MEDICATIONS  . aspirin EC  81 mg Oral Daily  . atorvastatin  10 mg Oral q1800  . enoxaparin (LOVENOX) injection  30 mg Subcutaneous Q24H  . escitalopram  5 mg Oral Daily   PRN:  acetaminophen **OR** acetaminophen, hydrALAZINE, LORazepam, oxycodone  CLINICALLY SIGNIFICANT STUDIES Basic Metabolic Panel:  Recent Labs  Lab 07/29/19 0527 07/30/19 0544  NA 143 144  K 3.5 3.3*  CL 116* 106  CO2 22 23  GLUCOSE 96 92  BUN 18 12  CREATININE 0.91 1.04*  CALCIUM 8.1* 8.8*  MG 1.4* 1.3*   Liver Function Tests:  Recent Labs  Lab 07/29/19 0527 07/30/19 0544  AST 35 46*  ALT 42 51*  ALKPHOS 53 74  BILITOT 0.4 1.0  PROT 4.4* 5.1*  ALBUMIN 2.2* 2.6*   CBC:  Recent Labs  Lab 07/25/19 2338  07/28/19 0544 07/29/19 0527 07/30/19 0544  WBC 16.3*   < > 8.9 7.6 10.2  NEUTROABS 12.9*  --  5.9  --   --   HGB 13.8   < > 11.0* 10.3* 13.3  HCT 42.3   < > 35.9* 33.6* 40.4  MCV 84.9   < > 91.1 89.8 86.0  PLT 268   < > 234 206 297   < > = values in this interval not displayed.   Coagulation: No results for input(s): LABPROT, INR in the last 168 hours. Cardiac Enzymes:  Recent Labs  Lab 07/26/19 0825  07/27/19 2019 07/28/19 0544 07/29/19 0527  CKTOTAL 2,584*   < > 832* 547* 268*  CKMB 34.6*  --   --   --   --    < > = values in this interval not displayed.   Urinalysis:  Recent Labs  Lab 07/26/19 0220  COLORURINE YELLOW  LABSPEC 1.016  PHURINE  5.0  GLUCOSEU NEGATIVE  HGBUR LARGE*  BILIRUBINUR NEGATIVE  KETONESUR 5*  PROTEINUR 30*  NITRITE NEGATIVE  LEUKOCYTESUR NEGATIVE   Lipid Panel    Component Value Date/Time   CHOL 113 07/28/2019 0544   TRIG 85 07/28/2019 0544   HDL 38 (L) 07/28/2019 0544   CHOLHDL 3.0 07/28/2019 0544   VLDL 17 07/28/2019 0544   LDLCALC 58 07/28/2019 0544   HgbA1C  Lab Results  Component Value Date   HGBA1C 5.9 (H) 07/28/2019    Urine Drug Screen:      Component Value Date/Time   LABOPIA POSITIVE (A) 10/25/2017 1209   COCAINSCRNUR NONE DETECTED 10/25/2017 1209   LABBENZ POSITIVE (A) 10/25/2017 1209   AMPHETMU NONE DETECTED 10/25/2017 1209   THCU NONE DETECTED 10/25/2017 1209   LABBARB NONE DETECTED 10/25/2017 1209    Alcohol Level: No results for input(s): ETH in the last 168 hours.  Dg Wrist 2 Views Left  Result Date: 07/30/2019 CLINICAL DATA:  Acute left wrist pain. EXAM:  LEFT WRIST - 2 VIEW COMPARISON:  No recent prior. FINDINGS: Soft tissue swelling. No radiopaque foreign body. No acute bony abnormality. No evidence of fracture or dislocation. Osteopenia. Mild degenerative changes radiocarpal joint and first carpometacarpal joint. No evidence of inflammatory arthropathy. IMPRESSION: Soft tissue swelling. No radiopaque foreign body. Osteopenia mild degenerative changes. No acute abnormality. No evidence of inflammatory arthropathy. Electronically Signed   By: Marcello Moores  Register   On: 07/30/2019 15:03   Mr Angio Head Wo Contrast  Result Date: 07/29/2019 CLINICAL DATA:  Stroke EXAM: MRA HEAD WITHOUT CONTRAST TECHNIQUE: Angiographic images of the Circle of Willis were obtained using MRA technique without intravenous contrast. COMPARISON:  MRI head 07/27/2019 FINDINGS: Image quality degraded by extensive motion. This examination is significantly limited in quality by motion. Both vertebral arteries are patent. The basilar is patent. Posterior cerebral arteries patent bilaterally. Internal  carotid artery patent bilaterally. Signal loss in the cavernous carotid bilaterally is likely artifactual. Anterior middle cerebral arteries appear patent bilaterally. Both anterior cerebral arteries supplied from the left. IMPRESSION: Negative for large vessel occlusion. Image quality is significantly degraded by extensive motion. Electronically Signed   By: Franchot Gallo M.D.   On: 07/29/2019 20:51   Therapy Recommendations SNF  Physical Exam   GEN: NAD, pleasant, cooperative CVS: RRR, no carotid bruit CHEST: No signs of resp distress, on room air ABD: Soft, NTTP  NEURO:  MENTAL STATUS: AAOx3  LANG/SPEECH: Fluent, intact naming, repetition & comprehension   CRANIAL NERVES:  II: Pupils equal and reactive, no RAPD, normal visual field and fundus  III, IV, VI: EOM intact, no gaze preference or deviation  V: normal  VII: no facial asymmetry  VIII: normal hearing to speech  MOTOR: 3/5 in both upper and lower extremities  REFLEXES: UTA SENSORY: Normal to touch, temperature & pin prick in all extremiteis  COORD: patient was tired and refused  ASSESSMENT This is an 85 y.o. female with a PMHx notable for HTN, osteoporosis, osteoarthitis, kyphoscoliosis, scoliosis, and recent treatment for cellulitis who presented to the [facility] ER for recurrent falls at home and failure to thrive. CT of the head for AMS demonstrated chronic ischemic changes only. However, her MRI brain revealed scattered acute small infarctions primarily within the white matter of the cerebral hemispheres but with some cortical involvement most notably in the left occipital region.   - MRI brain shows pattern of small vessel disease, scattered acute small infarctions in L occipital area, likely secondary to hypoperfusion or watershed infarcts. MRA negative for large vessel occlusion - Likely watershed infarcts secondary to AKI, dehydration, malnutrition, overall failure to thrive given elevated AKI and CK (improving) - Patient's  echocardiogram completed demonstrating an LVEF of 60-65%, mild calcification of the anterior mitral valve leaflet, with moderate mitral annular calcification and trivial mitral regurgitation. Patient requested the exam to end prematurely, no of presence of a PFO or thrombus noted on report.   TREATMENT/PLAN - Continue treating dehydration with IVF - continue asa and lipitor (continue lower dose especially in setting of elevated CK) - Neurology will sign off. Outpatient neurology follow up recommended. - social work input appreciated - patient could benefit from outpatient referral for psychiatry and/or mental health therapist/services  Posey Pronto PA-C [facility] [phone]  07/30/2019, 4:20 PM  ATTENDING NOTE: I reviewed above note and agree with the assessment and plan. Pt was seen and examined.   85 year old female with history of hypertension, PE in 1967, rheumatoid arthritis admitted for frequent falls at home and failure  to thrive with left upper extremity weakness.  Found to have AKI, rhabdomyolysis, dehydration, and episodic hypotension.  MRI left MCA/PCA and right MCA/ACA watershed infarcts.  MRA unremarkable.  A1c 5.9, LDL 58, EF 60 to 65%.  Carotid Doppler negative.  AKI improved after IV fluid, creatinine 2.23-1.67-1.12-0.91.  CK decreased and AST/ALT normalized.  On exam, patient awake alert, malnutrition, AAO x3, mild dysarthria, follow commands, no aphasia.  Able to name and repeat.  Visual fields are full, PERRL, tracking bilaterally, no gaze preference.  Facial symmetrical, tongue midline.  Left upper extremity 3/5 with drift, left lower extremity 3/5 with drift.  Right upper and extremity at least 4/5.  Sensation symmetrical, coronation intact on the right finger-to-nose.  Gait not tested.  Patient also complains of left wrist pain, x-ray showed soft tissue swelling no fracture.  Patient stroke most likely due to dehydration with hypotensive episodes  given the stroke location and recent AKI/dehydration/rhabdomyolysis.  Aspirin 81 resumed and started on Lipitor for stroke prevention.  Avoid low BP and stroke risk factor modification.  PT/OT recommend SNF.  No further stroke work-up needed.  Neurology will sign off. Please call with questions. Pt will follow up with stroke clinic NP at [facility] in about 4 weeks. Thanks for the consult.   Rosalin Hawking, MD PhD Stroke Neurology 07/30/2019 6:00 PM

## 2019-07-30 NOTE — Evaluation (Signed)
Physical Therapy Evaluation Patient Details Name: Angelica Bell MRN: [id number] DOB: [birth year] Today's Date: 07/30/2019   History of Present Illness  Pt is an 85 y/o female admitted after falling in the bathroom, recurrent falls, and FTT. Xrays negative for fracture.  CT of the head for AMS demonstrated chronic ischemic changes only. However, her MRI brain revealed scattered acute small infarctions primarily within the white matter of the cerebral hemispheres but with some cortical involvement most notably in the left occipital region.  This is concerning for possible embolic infarctions. PMH includes:  RA, HTN, osteoporosis, scoliosis, chronic back pain, h/o PE, h/o cellulitis, s/p Rt THA     Clinical Impression  Pt presented supine in bed with HOB elevated, awake and willing to participate in therapy session. Prior to admission, pt reported that she was independent with all functional mobility and ADLs. Per chart review, pt lives alone and has been neglecting her health. At the time of evaluation, pt very limited secondary to weakness, fatigue and pain. She required min-mod A x2 for bed mobility and only able to tolerate sitting upright at EOB for a very brief period of time with close min guard for safety. Pt would continue to benefit from skilled physical therapy services at this time while admitted and after d/c to address the below listed limitations in order to improve overall safety and independence with functional mobility.     Follow Up Recommendations SNF    Equipment Recommendations  None recommended by PT    Recommendations for Other Services       Precautions / Restrictions Precautions Precautions: Fall Restrictions Weight Bearing Restrictions: No      Mobility  Bed Mobility Overal bed mobility: Needs Assistance Bed Mobility: Supine to Sit;Sit to Supine     Supine to sit: Mod assist;+2 for physical assistance;HOB elevated Sit to supine: Min assist;+2 for physical  assistance   General bed mobility comments: increased time and effort, increased pain with movement therefore requiring more physical assistance at bilateral LEs and trunk  Transfers                 General transfer comment: pt deferring at this time secondary to increasing pain and requesting to lie back down despite max encouragement from therapists  Ambulation/Gait                Stairs            Wheelchair Mobility    Modified Rankin (Stroke Patients Only) Modified Rankin (Stroke Patients Only) Pre-Morbid Rankin Score: No symptoms Modified Rankin: Severe disability     Balance Overall balance assessment: Needs assistance Sitting-balance support: Feet supported Sitting balance-Leahy Scale: Poor Sitting balance - Comments: close min guard for safety; able to maintain sitting EOB ~3 minutes                                     Pertinent Vitals/Pain Pain Assessment: Faces Faces Pain Scale: Hurts even more Pain Location: L lateral trunk/ribcage Pain Descriptors / Indicators: Grimacing;Guarding Pain Intervention(s): Monitored during session;Repositioned    Home Living Family/patient expects to be discharged to:: Private residence Living Arrangements: Alone Available Help at Discharge: Family;Available PRN/intermittently Type of Home: House Home Access: Stairs to enter Entrance Stairs-Rails: None Entrance Stairs-Number of Steps: 1 Home Layout: One level Home Equipment: Walker - 2 wheels Additional Comments: information gleaned from previous admissions as she is very confused and  became agitated with questioning      Prior Function Level of Independence: Independent               Hand Dominance   Dominant Hand: Right    Extremity/Trunk Assessment   Upper Extremity Assessment Upper Extremity Assessment: Defer to OT evaluation    Lower Extremity Assessment Lower Extremity Assessment: Difficult to assess due to impaired  cognition;Generalized weakness    Cervical / Trunk Assessment Cervical / Trunk Assessment: Kyphotic  Communication   Communication: HOH  Cognition Arousal/Alertness: Awake/alert Behavior During Therapy: Restless;Anxious Overall Cognitive Status: Impaired/Different from baseline Area of Impairment: Memory;Following commands;Safety/judgement;Problem solving                     Memory: Decreased short-term memory Following Commands: Follows one step commands consistently;Follows one step commands with increased time Safety/Judgement: Decreased awareness of safety;Decreased awareness of deficits   Problem Solving: Decreased initiation;Slow processing;Difficulty sequencing;Requires verbal cues;Requires tactile cues        General Comments      Exercises     Assessment/Plan    PT Assessment Patient needs continued PT services  PT Problem List Decreased strength;Decreased activity tolerance;Decreased balance;Decreased mobility;Decreased coordination;Decreased cognition;Decreased knowledge of use of DME;Decreased safety awareness;Decreased knowledge of precautions;Pain       PT Treatment Interventions DME instruction;Gait training;Functional mobility training;Stair training;Therapeutic activities;Therapeutic exercise;Balance training;Neuromuscular re-education;Cognitive remediation;Patient/family education    PT Goals (Current goals can be found in the Care Plan section)  Acute Rehab PT Goals Patient Stated Goal: decrease pain, find my glasses PT Goal Formulation: With patient Time For Goal Achievement: 08/13/19 Potential to Achieve Goals: Fair    Frequency Min 4X/week   Barriers to discharge Decreased caregiver support      Co-evaluation PT/OT/SLP Co-Evaluation/Treatment: Yes Reason for Co-Treatment: Complexity of the patient's impairments (multi-system involvement);Necessary to address cognition/behavior during functional activity;For patient/therapist safety;To  address functional/ADL transfers PT goals addressed during session: Balance;Strengthening/ROM;Mobility/safety with mobility         AM-PAC PT "6 Clicks" Mobility  Outcome Measure Help needed turning from your back to your side while in a flat bed without using bedrails?: A Lot Help needed moving from lying on your back to sitting on the side of a flat bed without using bedrails?: A Lot Help needed moving to and from a bed to a chair (including a wheelchair)?: A Lot Help needed standing up from a chair using your arms (e.g., wheelchair or bedside chair)?: A Lot Help needed to walk in hospital room?: A Lot Help needed climbing 3-5 steps with a railing? : Total 6 Click Score: 11    End of Session   Activity Tolerance: Patient limited by pain;Patient limited by fatigue Patient left: in bed;with call bell/phone within reach;with bed alarm set Nurse Communication: Mobility status PT Visit Diagnosis: Other abnormalities of gait and mobility (R26.89);Other symptoms and signs involving the nervous system (R29.898)    Time: 1194-1740 PT Time Calculation (min) (ACUTE ONLY): 29 min   Charges:   PT Evaluation $PT Eval Moderate Complexity: 1 Mod          Sherie Don, PT, DPT  Acute Rehabilitation Services Pager [phone] Office [place] 07/30/2019, 12:55 PM

## 2019-07-30 NOTE — Progress Notes (Signed)
PROGRESS NOTE    Angelica Bell  WNU:[id number] DOB: 06-05-1932 DOA: 07/25/2019 PCP: Heywood Bene, PA-C   Brief Narrative:  Per HPI and previous PN [name] a87 y.o.female,whypertension, ? Rheumatoid arthritis (found in epic review), osteoporosis, scoliosis, chronic back pain, h/o PE (?? In our medical records in [facility] but can't find CTA chest or in Epic everywhere) , h/o cellulitis, presents with c/o failure to thrive, recurrent falls at home. Pt states that her poor po intake is due to not being able to get food. She was admitted with failure to thrive and recurrent falls.  She was noted to have LUE weakness on 7/28 and MRI was performed notable for acute scattered small infarctions.  Neurology was c/s who requested [facility].  Assessment & Plan:   Principal Problem:   ARF (acute renal failure) (HCC) Active Problems:   Hypokalemia   Essential hypertension   Hyponatremia   Abnormal liver function   Cerebrovascular accident (CVA) ([place])  Acute Stroke  LUE Weakness:  CVA with evidence of acute small infarctions scattered within the white matter of the cerebral hemispheres (see report - favored hypoperfusion/watershed, embolic possible). Neurology following - appreciate insight and recs -MRA negative for large occlusion but limited by motion artifact PT/OT/SLP continue to follow - advance diet as tolerated now more awake/alert -speech recommending regular diet thin liquids Echo without overt PFO or thrombus although stopped early due to patient request Carotid dopplers with 1-39% stenosis bilaterally with antegrade flow in bilateral vertebral arteries Aspirin, lipitor ongoing LDL 58, HDL 38. A1c 5.9.  Failure to thrive, ongoing:   Per family: patient remains in bed for 10 to 14 hours a day has chronic weakness, is minimally interactive with family and has appeared to have "given up" per previous discussions. Continue to advance diet per speech, regular  diet thin liquids PT/OT-recommending SNF, defer to case management for placement options  Acute renal failure  Elevated CK, mild rhabdo  NAGMA, resolved Baseline creatinine ~1.  Peaked at 2.76. - now 0.9 CK elevated in this pt found on floor for about 12 hours, will trend with IVF given poor PO status  Hypokalemia, severe, improved Repleted previously - now borderline low again likely 2/2 poor PO intake Continue to monitor  Abnormal liver function, resolved Improving with hydration Acute hepatitis panel unremarkable Ck elevated CMP in AM  Hypertension Monitor bp  Chronic back pain Cont oxycodone   DVT prophylaxis: lovenox Code Status: full Family Communication: discussed with grandson Disposition Plan: pending further workup and clinical improvement  Consultants:   neurology  Subjective: No acute issues or events overnight reported, patient continues to be poorly interactive, replying "cold" to all questions or prompts.  Poorly interactive with exam, not following commands  Objective: Vitals:   07/29/19 1944 07/30/19 0007 07/30/19 0329 07/30/19 0345  BP: (!) 183/82 (!) 182/89 (!) 176/97 (!) 164/97  Pulse: 89 98 (!) 120 98  Resp:  20  (!) 21  Temp:  98.2 F (36.8 C)  98.8 F (37.1 C)  TempSrc:  Oral  Oral  SpO2:  94%  98%  Weight:      Height:        Intake/Output Summary (Last 24 hours) at 07/30/2019 0807 Last data filed at 07/30/2019 0008 Gross per 24 hour  Intake -  Output 1425 ml  Net -1425 ml   Filed Weights   07/25/19 2258 07/27/19 0600 07/28/19 0523  Weight: 55.8 kg 55.6 kg 54.2 kg    Examination:  General:  Pleasantly resting in bed, No acute distress.  Drowsy, easily arousable but follows commands poorly, essentially nonverbal other than the word "cold" HEENT:  Normocephalic atraumatic.  Sclerae nonicteric, noninjected.  Extraocular movements intact bilaterally. Neck:  Without mass or deformity.  Trachea is midline. Lungs:  Clear to  auscultate bilaterally without rhonchi, wheeze, or rales. Heart:  Regular rate and rhythm.  Without murmurs, rubs, or gallops. Abdomen:  Soft, nontender, nondistended.  Without guarding or rebound. Extremities: Without cyanosis, clubbing, edema, or obvious deformity. Vascular:  Dorsalis pedis and posterior tibial pulses palpable bilaterally. Skin:  Warm and dry, no erythema, no ulcerations.  Data Reviewed: I have personally reviewed following labs and imaging studies  CBC: Recent Labs  Lab 07/25/19 2338 07/27/19 0439 07/28/19 0544 07/29/19 0527 07/30/19 0544  WBC 16.3* 8.3 8.9 7.6 10.2  NEUTROABS 12.9*  --  5.9  --   --   HGB 13.8 9.2* 11.0* 10.3* 13.3  HCT 42.3 29.4* 35.9* 33.6* 40.4  MCV 84.9 89.1 91.1 89.8 86.0  PLT 268 194 234 206 631   Basic Metabolic Panel: Recent Labs  Lab 07/25/19 2338 07/26/19 0825 07/27/19 0439 07/28/19 0544 07/29/19 0527 07/30/19 0544  NA 134* 136 140 144 143 144  K 2.4* 3.0* 3.7 4.0 3.5 3.3*  CL 95* 101 113* 117* 116* 106  CO2 24 22 20* 20* 22 23  GLUCOSE 119* 111* 93 83 96 92  BUN 61* 55* 43* 28* 18 12  CREATININE 2.76* 2.23* 1.67* 1.12* 0.91 1.04*  CALCIUM 8.8* 8.0* 7.7* 8.1* 8.1* 8.8*  MG 2.1  --   --   --  1.4* 1.3*   GFR: Estimated Creatinine Clearance: 33.8 mL/min (A) (by C-G formula based on SCr of 1.04 mg/dL (H)). Liver Function Tests: Recent Labs  Lab 07/26/19 0825 07/27/19 0439 07/28/19 0544 07/29/19 0527 07/30/19 0544  AST 109* 76* 52* 35 46*  ALT 69* 55* 52* 42 51*  ALKPHOS 68 49 54 53 74  BILITOT 0.5 0.2* 0.6 0.4 1.0  PROT 5.3* 4.4* 4.9* 4.4* 5.1*  ALBUMIN 2.7* 2.2* 2.5* 2.2* 2.6*   No results for input(s): LIPASE, AMYLASE in the last 168 hours. No results for input(s): AMMONIA in the last 168 hours. Coagulation Profile: No results for input(s): INR, PROTIME in the last 168 hours. Cardiac Enzymes: Recent Labs  Lab 07/26/19 0825 07/27/19 1343 07/27/19 2019 07/28/19 0544 07/29/19 0527  CKTOTAL 2,584*  1,367* 832* 547* 268*  CKMB 34.6*  --   --   --   --    HbA1C: Recent Labs    07/28/19 0544  HGBA1C 5.9*   Lipid Profile: Recent Labs    07/28/19 0544  CHOL 113  HDL 38*  LDLCALC 58  TRIG 85  CHOLHDL 3.0   Recent Results (from the past 240 hour(s))  SARS Coronavirus 2 (CEPHEID - Performed in [facility] hospital lab), Hosp Order     Status: None   Collection Time: 07/26/19  3:02 AM   Specimen: Nasopharyngeal Swab  Result Value Ref Range Status   SARS Coronavirus 2 NEGATIVE NEGATIVE Final    Comment: (NOTE) If result is NEGATIVE SARS-CoV-2 target nucleic acids are NOT DETECTED. The SARS-CoV-2 RNA is generally detectable in upper and lower  respiratory specimens during the acute phase of infection. The lowest  concentration of SARS-CoV-2 viral copies this assay can detect is 250  copies / mL. A negative result does not preclude SARS-CoV-2 infection  and should not be used as the  sole basis for treatment or other  patient management decisions.  A negative result may occur with  improper specimen collection / handling, submission of specimen other  than nasopharyngeal swab, presence of viral mutation(s) within the  areas targeted by this assay, and inadequate number of viral copies  (<250 copies / mL). A negative result must be combined with clinical  observations, patient history, and epidemiological information. If result is POSITIVE SARS-CoV-2 target nucleic acids are DETECTED. The SARS-CoV-2 RNA is generally detectable in upper and lower  respiratory specimens dur ing the acute phase of infection.  Positive  results are indicative of active infection with SARS-CoV-2.  Clinical  correlation with patient history and other diagnostic information is  necessary to determine patient infection status.  Positive results do  not rule out bacterial infection or co-infection with other viruses. If result is PRESUMPTIVE POSTIVE SARS-CoV-2 nucleic acids MAY BE PRESENT.   A  presumptive positive result was obtained on the submitted specimen  and confirmed on repeat testing.  While 2019 novel coronavirus  (SARS-CoV-2) nucleic acids may be present in the submitted sample  additional confirmatory testing may be necessary for epidemiological  and / or clinical management purposes  to differentiate between  SARS-CoV-2 and other Sarbecovirus currently known to infect humans.  If clinically indicated additional testing with an alternate test  methodology [phone]) is advised. The SARS-CoV-2 RNA is generally  detectable in upper and lower respiratory sp ecimens during the acute  phase of infection. The expected result is Negative. Fact Sheet for Patients:  StrictlyIdeas.no Fact Sheet for Healthcare Providers: BankingDealers.co.za This test is not yet approved or cleared by the [place] FDA and has been authorized for detection and/or diagnosis of SARS-CoV-2 by FDA under an Emergency Use Authorization (EUA).  This EUA will remain in effect (meaning this test can be used) for the duration of the COVID-19 declaration under Section 564(b)(1) of the Act, 21 U.S.C. section 360bbb-3(b)(1), unless the authorization is terminated or revoked sooner. Performed at [facility], [place] [address], [place], [place] [address]          Radiology Studies: Mr Angio Head Wo Contrast  Result Date: 07/29/2019 CLINICAL DATA:  Stroke EXAM: MRA HEAD WITHOUT CONTRAST TECHNIQUE: Angiographic images of the Circle of Willis were obtained using MRA technique without intravenous contrast. COMPARISON:  MRI head 07/27/2019 FINDINGS: Image quality degraded by extensive motion. This examination is significantly limited in quality by motion. Both vertebral arteries are patent. The basilar is patent. Posterior cerebral arteries patent bilaterally. Internal carotid artery patent bilaterally. Signal loss in the cavernous carotid  bilaterally is likely artifactual. Anterior middle cerebral arteries appear patent bilaterally. Both anterior cerebral arteries supplied from the left. IMPRESSION: Negative for large vessel occlusion. Image quality is significantly degraded by extensive motion. Electronically Signed   By: Franchot Gallo M.D.   On: 07/29/2019 20:51   Scheduled Meds: . aspirin EC  81 mg Oral Daily  . atorvastatin  10 mg Oral q1800  . enoxaparin (LOVENOX) injection  30 mg Subcutaneous Q24H  . escitalopram  5 mg Oral Daily   Continuous Infusions: . magnesium sulfate bolus IVPB       LOS: 4 days   Time spent: over 30 min   Little Ishikawa, MD [facility] Pager AMION  If 7PM-7AM, please contact night-coverage [email or web address] Password [email or web address] 07/30/2019, 8:07 AM

## 2019-07-30 NOTE — Progress Notes (Signed)
Tele notified me that pt had couplets and triplets at 1500,pt assessed to be asymptomatic, VS WNL. MD was notified no new orders received but to continue monitoring pt.

## 2019-07-30 NOTE — Progress Notes (Addendum)
Occupational Therapy Treatment Patient Details Name: Angelica Bell MRN: [id number] DOB: 1932-07-24 Today's Date: 07/30/2019    History of present illness Pt is an 85 y/o female admitted after falling in the bathroom, recurrent falls, and FTT. Xrays negative for fracture.  CT of the head for AMS demonstrated chronic ischemic changes only. However, her MRI brain revealed scattered acute small infarctions primarily within the white matter of the cerebral hemispheres but with some cortical involvement most notably in the left occipital region.  This is concerning for possible embolic infarctions. PMH includes:  RA, HTN, osteoporosis, scoliosis, chronic back pain, h/o PE, h/o cellulitis, s/p Rt THA    OT comments  Pt with slow progress towards OT goals. She continues to require max encouragement to participate in functional tasks and to perform simple ADL on her own. Pt overall pleasant but generally fatigued and somewhat self-limiting, with c/o pain in L flank; she was only able to tolerate sitting EOB for brief period of time with two person assist required for bed mobility. Pt with minimal movement noted in LUE with use of hand over hand assist provided to incorporate UE into functional tasks. Feel SNF recommendation remains appropriate at this time. Will continue to follow acutely.   Follow Up Recommendations  SNF    Equipment Recommendations  None recommended by OT          Precautions / Restrictions Precautions Precautions: Fall Restrictions Weight Bearing Restrictions: No       Mobility Bed Mobility Overal bed mobility: Needs Assistance Bed Mobility: Supine to Sit;Sit to Supine     Supine to sit: Mod assist;+2 for physical assistance;HOB elevated Sit to supine: Min assist;+2 for physical assistance   General bed mobility comments: increased time and effort, increased pain with movement therefore requiring more physical assistance at bilateral LEs and trunk  Transfers                 General transfer comment: pt deferring at this time secondary to increasing pain and requesting to lie back down despite max encouragement from therapists    Balance Overall balance assessment: Needs assistance Sitting-balance support: Feet supported Sitting balance-Leahy Scale: Poor Sitting balance - Comments: close min guard for safety; able to maintain sitting EOB ~3 minutes                                   ADL either performed or assessed with clinical judgement   ADL Overall ADL's : Needs assistance/impaired Eating/Feeding: Minimal assistance;Sitting;Bed level Eating/Feeding Details (indicate cue type and reason): pt requires hand over hand assist initially to eat, support of LUE to hold icecream cup and support at R hand to scoop ice cream. pt can perform scooping with RUE on her own but requires encouragement to do so  Grooming: Wash/dry face;Brushing hair;Minimal assistance;Sitting Grooming Details (indicate cue type and reason): hand over hand to comb hair (due to pt stating she can't do it) however pt is able to easily reach her head without assist; pt able to blow nose, etc without assist using RUE                               General ADL Comments: pt only tolerating sitting EOB for very brief period of time and requires MAX encouragement to perform; somewhat self limiting, additional limitations include weakness and fatigue  Cognition Arousal/Alertness: Awake/alert Behavior During Therapy: Restless;Anxious Overall Cognitive Status: Impaired/Different from baseline Area of Impairment: Memory;Following commands;Safety/judgement;Problem solving                     Memory: Decreased short-term memory Following Commands: Follows one step commands consistently;Follows one step commands with increased time Safety/Judgement: Decreased awareness of safety;Decreased awareness of deficits   Problem  Solving: Decreased initiation;Slow processing;Difficulty sequencing;Requires verbal cues;Requires tactile cues          Exercises     Shoulder Instructions       General Comments      Pertinent Vitals/ Pain       Pain Assessment: Faces Faces Pain Scale: Hurts even more Pain Location: L lateral trunk/ribcage Pain Descriptors / Indicators: Grimacing;Guarding;Discomfort Pain Intervention(s): Monitored during session;Repositioned  Home Living Family/patient expects to be discharged to:: Private residence Living Arrangements: Alone Available Help at Discharge: Family;Available PRN/intermittently Type of Home: House Home Access: Stairs to enter [employer] of Steps: 1 Entrance Stairs-Rails: None Home Layout: One level     Bathroom Shower/Tub: Occupational psychologist: Handicapped height     Home Equipment: Environmental consultant - 2 wheels   Additional Comments: information gleaned from previous admissions as she is very confused and became agitated with questioning        Prior Functioning/Environment Level of Independence: Independent            Frequency  Min 2X/week        Progress Toward Goals  OT Goals(current goals can now be found in the care plan section)  Progress towards OT goals: Progressing toward goals  Acute Rehab OT Goals Patient Stated Goal: decrease pain, find my glasses OT Goal Formulation: With patient Time For Goal Achievement: 08/10/19 Potential to Achieve Goals: Good  Plan Discharge plan remains appropriate    Co-evaluation    PT/OT/SLP Co-Evaluation/Treatment: Yes Reason for Co-Treatment: Complexity of the patient's impairments (multi-system involvement);Necessary to address cognition/behavior during functional activity;For patient/therapist safety;To address functional/ADL transfers PT goals addressed during session: Balance;Strengthening/ROM;Mobility/safety with mobility OT goals addressed during session: ADL's and  self-care      AM-PAC OT "6 Clicks" Daily Activity     Outcome Measure   Help from another person eating meals?: A Little Help from another person taking care of personal grooming?: A Lot Help from another person toileting, which includes using toliet, bedpan, or urinal?: Total Help from another person bathing (including washing, rinsing, drying)?: A Lot Help from another person to put on and taking off regular upper body clothing?: A Lot Help from another person to put on and taking off regular lower body clothing?: Total 6 Click Score: 11    End of Session    OT Visit Diagnosis: Unsteadiness on feet (R26.81);Cognitive communication deficit (R41.841)   Activity Tolerance Patient limited by fatigue;Patient limited by pain   Patient Left in bed;with call bell/phone within reach;with bed alarm set   Nurse Communication Mobility status        Time: 6195-0932 OT Time Calculation (min): 28 min  Charges: OT General Charges $OT Visit: 1 Visit OT Treatments $Self Care/Home Management : 8-22 mins  Lou Cal, OT [facility] Services Pager [phone] Office [phone]    Raymondo Band 07/30/2019, 1:11 PM

## 2019-07-30 NOTE — Evaluation (Signed)
Clinical/Bedside Swallow Evaluation Patient Details  Name: Angelica Bell MRN: [id number] Date of Birth: [birth year]  Today's Date: 07/30/2019 Time: SLP Start Time (ACUTE ONLY): 1120 SLP Stop Time (ACUTE ONLY): 1135 SLP Time Calculation (min) (ACUTE ONLY): 15 min  Past Medical History:  Past Medical History:  Diagnosis Date  . Chronic back pain    with degenerative joint disease  . Chronic pain disorder    with narcotic management  . Dry eyes   . Hypertension   . Osteoporosis   . PE (pulmonary embolism)    After hysterectomy in 1967  . Pneumonia    Past Surgical History:  Past Surgical History:  Procedure Laterality Date  . ABDOMINAL HYSTERECTOMY    . APPENDECTOMY    . BREAST ENHANCEMENT SURGERY    . CATARACT EXTRACTION    . FEMUR IM NAIL Left 02/17/2017   Procedure: INTRAMEDULLARY (IM) NAIL FEMORAL;  Surgeon: Nicholes Stairs, MD;  Location: [facility];  Service: Orthopedics;  Laterality: Left;  . HIP ARTHROPLASTY Right 12/24/2015   Procedure: ARTHROPLASTY BIPOLAR HIP (HEMIARTHROPLASTY);  Surgeon: Netta Cedars, MD;  Location: [facility];  Service: Orthopedics;  Laterality: Right;  . TONSILLECTOMY     HPI:  Pt is an 85 yo female admitted after fall, she stays in bed 14 hours a day per her son.  LUE weakness. MRI brain 07/27/2019  Background pattern of small-vessel disease with old small vessel disease.  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 She had BSE on 07/28/19 and SLP did not recommend any follow-up, however BSE was reordered secondary to patient c/o difficulty swallowing.   Assessment / Plan / Recommendation Clinical Impression  Patient presents with functional oropharyngeal swallow with overt s/s of aspiration or penetration. Although SLP was reordered for swallow evaluation secondary to patient c/o difficulty, she currently denies any swallowing problems. SLP did observe that patient ate perhaps 2-3 bites of chicken and brocoli on lunch  tray, but nothing else. Swallow initaition was timely with thin liquids via straw sips and no overt s/s of aspiration or penetration observed. Patient's voice was normal and clear and no changes in vocal quality. It appears that patient's issues are currently centered around her pain, and this is affecting her appetite and PO intake. No SLP intervention warranted at this time. SLP Visit Diagnosis: Dysphagia, unspecified (R13.10)    Aspiration Risk  No limitations;Mild aspiration risk    Diet Recommendation Regular;Thin liquid   Liquid Administration via: Cup;Straw Medication Administration: Whole meds with liquid Supervision: Patient able to self feed Compensations: Slow rate;Small sips/bites Postural Changes: Seated upright at 90 degrees    Other  Recommendations Oral Care Recommendations: Oral care BID   Follow up Recommendations None      Frequency and Duration  N/A          Prognosis    N/A    Swallow Study   General Date of Onset: 07/30/19 HPI: Pt is an 85 yo female admitted after fall, she stays in bed 14 hours a day per her son.  LUE weakness. MRI brain 07/27/2019  Background pattern of small-vessel disease with old small vessel disease.  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 She had BSE on 07/28/19 and SLP did not recommend any follow-up, however BSE was reordered secondary to patient c/o difficulty swallowing. Type of Study: Bedside Swallow Evaluation Previous Swallow Assessment: BSE on 07/28/19, no f/u Diet Prior  to this Study: Regular;Thin liquids Temperature Spikes Noted: No Respiratory Status: Room air History of Recent Intubation: No Behavior/Cognition: Cooperative;Alert;Pleasant mood Oral Cavity Assessment: Within Functional Limits Oral Care Completed by SLP: No Oral Cavity - Dentition: Dentures, top;Dentures, bottom Vision: Functional for self-feeding Self-Feeding Abilities: Able to feed self Patient Positioning:  Upright in bed Baseline Vocal Quality: Normal Volitional Cough: Strong Volitional Swallow: Able to elicit    Oral/Motor/Sensory Function Overall Oral Motor/Sensory Function: Mild impairment Facial ROM: Reduced right Facial Symmetry: Abnormal symmetry right Facial Strength: Within Functional Limits Lingual ROM: Within Functional Limits Lingual Symmetry: Within Functional Limits Lingual Strength: Within Functional Limits Lingual Sensation: Within Functional Limits Velum: Within Functional Limits Mandible: Within Functional Limits   Ice Chips     Thin Liquid Thin Liquid: Within functional limits Presentation: Self Fed;Straw Other Comments: No overt s/s of aspiration with sips of thin liquids via straw sips.    Nectar Thick     Honey Thick     Puree Puree: Not tested   Solid     Solid: Not tested Other Comments: Patient declined any PO's other than water      Nadara Mode Tarrell 07/30/2019,1:34 PM  Sonia Baller, MA, CCC-SLP Speech Therapy [facility] Acute Rehab Pager: [phone]

## 2019-07-31 LAB — CBC
HCT: 36 % (ref 36.0–46.0)
Hemoglobin: 11.9 g/dL — ABNORMAL LOW (ref 12.0–15.0)
MCH: 28.4 pg (ref 26.0–34.0)
MCHC: 33.1 g/dL (ref 30.0–36.0)
MCV: 85.9 fL (ref 80.0–100.0)
Platelets: 288 10*3/uL (ref 150–400)
RBC: 4.19 MIL/uL (ref 3.87–5.11)
RDW: 14.3 % (ref 11.5–15.5)
WBC: 9.9 10*3/uL (ref 4.0–10.5)
nRBC: 0 % (ref 0.0–0.2)

## 2019-07-31 LAB — COMPREHENSIVE METABOLIC PANEL
ALT: 40 U/L (ref 0–44)
AST: 33 U/L (ref 15–41)
Albumin: 2.4 g/dL — ABNORMAL LOW (ref 3.5–5.0)
Alkaline Phosphatase: 62 U/L (ref 38–126)
Anion gap: 8 (ref 5–15)
BUN: 10 mg/dL (ref 8–23)
CO2: 25 mmol/L (ref 22–32)
Calcium: 8.3 mg/dL — ABNORMAL LOW (ref 8.9–10.3)
Chloride: 108 mmol/L (ref 98–111)
Creatinine, Ser: 0.9 mg/dL (ref 0.44–1.00)
GFR calc Af Amer: >60 mL/min (ref >60)
GFR calc non Af Amer: 58 mL/min — ABNORMAL LOW (ref >60)
Glucose, Bld: 120 mg/dL — ABNORMAL HIGH (ref 70–99)
Potassium: 3.1 mmol/L — ABNORMAL LOW (ref 3.5–5.1)
Sodium: 141 mmol/L (ref 135–145)
Total Bilirubin: 0.5 mg/dL (ref 0.3–1.2)
Total Protein: 4.6 g/dL — ABNORMAL LOW (ref 6.5–8.1)

## 2019-07-31 LAB — CK TOTAL AND CKMB (NOT AT ARMC)
CK, MB: 1.7 ng/mL (ref 0.5–5.0)
Relative Index: INVALID (ref 0.0–2.5)
Total CK: 88 U/L (ref 38–234)

## 2019-07-31 LAB — MAGNESIUM: Magnesium: 1.2 mg/dL — ABNORMAL LOW (ref 1.7–2.4)

## 2019-07-31 MED ORDER — ENOXAPARIN SODIUM 40 MG/0.4ML ~~LOC~~ SOLN
40.0000 mg | SUBCUTANEOUS | Status: DC
  Administered 2019-08-01 – 2019-08-07 (×7): 40 mg via SUBCUTANEOUS
  Filled 2019-07-31 (×7): qty 0.4

## 2019-07-31 MED ORDER — POTASSIUM CHLORIDE IN NACL 20-0.45 MEQ/L-% IV SOLN
INTRAVENOUS | Status: AC
  Administered 2019-07-31: 10:00:00 via INTRAVENOUS
  Filled 2019-07-31: qty 1000

## 2019-07-31 MED ORDER — PROMETHAZINE HCL 25 MG/ML IJ SOLN: 25.0000 mg | Freq: Four times a day (QID) | INTRAMUSCULAR | Status: DC | PRN

## 2019-07-31 MED ORDER — POTASSIUM CHLORIDE CRYS ER 20 MEQ PO TBCR
20.0000 meq | EXTENDED_RELEASE_TABLET | Freq: Once | ORAL | Status: AC
  Administered 2019-07-31: 10:00:00 20 meq via ORAL
  Filled 2019-07-31: qty 2

## 2019-07-31 MED ORDER — ONDANSETRON 4 MG PO TBDP
4.0000 mg | ORAL_TABLET | Freq: Three times a day (TID) | ORAL | Status: DC | PRN
  Administered 2019-07-31: 10:00:00 4 mg via ORAL
  Filled 2019-07-31: qty 1

## 2019-07-31 NOTE — Progress Notes (Signed)
PROGRESS NOTE    Angelica Bell  FBP:[id number] DOB: 1932-03-03 DOA: 07/25/2019 PCP: Angelica Bene, PA-C   Brief Narrative:  Per HPI and previous PN [name] a87 y.o.female,whypertension, ? Rheumatoid arthritis (found in epic review), osteoporosis, scoliosis, chronic back pain, h/o PE (?? In our medical records in [facility] but can't find CTA chest or in Epic everywhere) , h/o cellulitis, presents with c/o failure to thrive, recurrent falls at home. Pt states that her poor po intake is due to not being able to get food. She was admitted with failure to thrive and recurrent falls.  She was noted to have LUE weakness on 7/28 and MRI was performed notable for acute scattered small infarctions.  Neurology was c/s who [facility].  Subjective:   The patient was seen and examined this morning, continue to complain of persistent nausea, unable to eat this morning, had no episode of vomiting yet. Patient was cooperative this morning persistent left-sided weakness was noted.  Patient reported no progression of her symptoms.     Assessment & Plan:   Principal Problem:   ARF (acute renal failure) (HCC) Active Problems:   Hypokalemia   Essential hypertension   Hyponatremia   Abnormal liver function   Cerebrovascular accident (CVA) (National)  Acute Stroke  LUE Weakness:  -Persistent left-sided weakness, with no progression of her symptoms -Continue to monitor closely, neurochecks  CVA with evidence of acute small infarctions scattered within the white matter of the cerebral hemispheres (see report - favored hypoperfusion/watershed, embolic possible). Neurology following - appreciate insight and recs -MRA negative for large occlusion but limited by motion artifact PT/OT/SLP continue to follow -patient's continues to have persistent nausea  -we will attempt to  Advance her diet .  -speech recommending regular diet thin liquids Echo without overt PFO or thrombus  although stopped early due to patient request Carotid dopplers with 1-39% stenosis bilaterally with antegrade flow in bilateral vertebral arteries Aspirin, lipitor ongoing LDL 58, HDL 38. A1c 5.9.  Failure to thrive, ongoing:   -Continues to have poor p.o. intake, persistent nausea -We will continue PRN antiemetics, encouraging p.o. intake, nutrition consult  -Per family: patient remains in bed for 10 to 14 hours a day has chronic weakness, is minimally interactive with family and has appeared to have "given up" per previous discussions. PT/OT-recommending SNF, defer to case management for placement options  Acute renal failure  Elevated CK, mild rhabdo  NAGMA, resolved -Stable, Baseline creatinine ~1.  Peaked at 2.76. - now 0.9 CK elevated in this pt found on floor for about 12 hours, will trend with IVF given poor PO status  Hypokalemia, severe, improved Repleted previously - now borderline low again likely 2/2 poor PO intake Continue to monitor  Abnormal liver function, resolved Improving with hydration Acute hepatitis panel unremarkable Ck elevated CMP in AM  Hypertension Monitor bp  Chronic back pain Cont oxycodone   DVT prophylaxis: lovenox Code Status: full Family Communication: discussed with grandson Disposition Plan: Status post PT/OT evaluation, recommending SNF, appreciate social worker Education officer, museum, and SNF search  Consultants:   neurology  ----------------------------------------------------------------------------------------------------------------------------------------------------   Objective: BP (!) 115/57 (BP Location: Left Arm)   Pulse 86   Temp 98.5 F (36.9 C) (Oral)   Resp (!) 22   Ht 5\' 6"  (1.676 m)   Wt 55 kg   SpO2 93%   BMI 19.57 kg/m    Physical Exam  Constitution:  Alert, cooperative, no distress,  Psychiatric: Normal and  stable mood and affect, cognition intact,   HEENT: Normocephalic, PERRL,  otherwise with in Normal limits  Chest:Chest symmetric Cardio vascular:  S1/S2, RRR, No murmure, No Rubs or Gallops  pulmonary: Clear to auscultation bilaterally, respirations unlabored, negative wheezes / crackles Abdomen: Soft, non-tender, non-distended, bowel sounds,no masses, no organomegaly Muscular skeletal: Limited exam - in bed, able to move all 4 extremities, Normal strength,  Neuro: CNII-XII intact. ,  Left upper extremity strength 2 out of 5 left lower extremity strength 3-5, mild reduced sensation on the left, facial asymmetry, Extremities: No pitting edema lower extremities, +2 pulses  Skin: Dry, warm to touch, negative for any Rashes, No open wounds Wounds: per nursing documentation      Data Reviewed: I have personally reviewed following labs and imaging studies  CBC: Recent Labs  Lab 07/25/19 2338 07/27/19 0439 07/28/19 0544 07/29/19 0527 07/30/19 0544 07/31/19 0337  WBC 16.3* 8.3 8.9 7.6 10.2 9.9  NEUTROABS 12.9*  --  5.9  --   --   --   HGB 13.8 9.2* 11.0* 10.3* 13.3 11.9*  HCT 42.3 29.4* 35.9* 33.6* 40.4 36.0  MCV 84.9 89.1 91.1 89.8 86.0 85.9  PLT 268 194 234 206 297 767   Basic Metabolic Panel: Recent Labs  Lab 07/25/19 2338  07/27/19 0439 07/28/19 0544 07/29/19 0527 07/30/19 0544 07/31/19 0337 07/31/19 0929  NA 134*   < > 140 144 143 144 141  --   K 2.4*   < > 3.7 4.0 3.5 3.3* 3.1*  --   CL 95*   < > 113* 117* 116* 106 108  --   CO2 24   < > 20* 20* 22 23 25   --   GLUCOSE 119*   < > 93 83 96 92 120*  --   BUN 61*   < > 43* 28* 18 12 10   --   CREATININE 2.76*   < > 1.67* 1.12* 0.91 1.04* 0.90  --   CALCIUM 8.8*   < > 7.7* 8.1* 8.1* 8.8* 8.3*  --   MG 2.1  --   --   --  1.4* 1.3*  --  1.2*   < > = values in this interval not displayed.   GFR: Estimated Creatinine Clearance: 39.7 mL/min (by C-G formula based on SCr of 0.9 mg/dL). Liver Function Tests: Recent Labs  Lab 07/27/19 0439 07/28/19 0544 07/29/19 0527 07/30/19 0544 07/31/19  0337  AST 76* 52* 35 46* 33  ALT 55* 52* 42 51* 40  ALKPHOS 49 54 53 74 62  BILITOT 0.2* 0.6 0.4 1.0 0.5  PROT 4.4* 4.9* 4.4* 5.1* 4.6*  ALBUMIN 2.2* 2.5* 2.2* 2.6* 2.4*   No results for input(s): LIPASE, AMYLASE in the last 168 hours. No results for input(s): AMMONIA in the last 168 hours. Coagulation Profile: No results for input(s): INR, PROTIME in the last 168 hours. Cardiac Enzymes: Recent Labs  Lab 07/26/19 0825 07/27/19 1343 07/27/19 2019 07/28/19 0544 07/29/19 0527 07/31/19 0929  CKTOTAL 2,584* 1,367* 832* 547* 268* 88  CKMB 34.6*  --   --   --   --  1.7   HbA1C: No results for input(s): HGBA1C in the last 72 hours. Lipid Profile: No results for input(s): CHOL, HDL, LDLCALC, TRIG, CHOLHDL, LDLDIRECT in the last 72 hours. Recent Results (from the past 240 hour(s))  SARS Coronavirus 2 (CEPHEID - Performed in [place] hospital lab), Hosp Order     Status: None   Collection Time: 07/26/19  3:02 AM   Specimen: Nasopharyngeal Swab  Result Value Ref Range Status   SARS Coronavirus 2 NEGATIVE NEGATIVE Final    Comment: (NOTE) If result is NEGATIVE SARS-CoV-2 target nucleic acids are NOT DETECTED. The SARS-CoV-2 RNA is generally detectable in upper and lower  respiratory specimens during the acute phase of infection. The lowest  concentration of SARS-CoV-2 viral copies this assay can detect is 250  copies / mL. A negative result does not preclude SARS-CoV-2 infection  and should not be used as the sole basis for treatment or other  patient management decisions.  A negative result may occur with  improper specimen collection / handling, submission of specimen other  than nasopharyngeal swab, presence of viral mutation(s) within the  areas targeted by this assay, and inadequate number of viral copies  (<250 copies / mL). A negative result must be combined with clinical  observations, patient history, and epidemiological information. If result is POSITIVE SARS-CoV-2  target nucleic acids are DETECTED. The SARS-CoV-2 RNA is generally detectable in upper and lower  respiratory specimens dur ing the acute phase of infection.  Positive  results are indicative of active infection with SARS-CoV-2.  Clinical  correlation with patient history and other diagnostic information is  necessary to determine patient infection status.  Positive results do  not rule out bacterial infection or co-infection with other viruses. If result is PRESUMPTIVE POSTIVE SARS-CoV-2 nucleic acids MAY BE PRESENT.   A presumptive positive result was obtained on the submitted specimen  and confirmed on repeat testing.  While 2019 novel coronavirus  (SARS-CoV-2) nucleic acids may be present in the submitted sample  additional confirmatory testing may be necessary for epidemiological  and / or clinical management purposes  to differentiate between  SARS-CoV-2 and other Sarbecovirus currently known to infect humans.  If clinically indicated additional testing with an alternate test  methodology [id number]) is advised. The SARS-CoV-2 RNA is generally  detectable in upper and lower respiratory sp ecimens during the acute  phase of infection. The expected result is Negative. Fact Sheet for Patients:  StrictlyIdeas.no Fact Sheet for Healthcare Providers: BankingDealers.co.za This test is not yet approved or cleared by the [place] FDA and has been authorized for detection and/or diagnosis of SARS-CoV-2 by FDA under an Emergency Use Authorization (EUA).  This EUA will remain in effect (meaning this test can be used) for the duration of the COVID-19 declaration under Section 564(b)(1) of the Act, 21 U.S.C. section 360bbb-3(b)(1), unless the authorization is terminated or revoked sooner. Performed at [facility], [place] [address] [place], [place] [address]          Radiology Studies: Dg Wrist 2 Views Left  Result  Date: 07/30/2019 CLINICAL DATA:  Acute left wrist pain. EXAM: LEFT WRIST - 2 VIEW COMPARISON:  No recent prior. FINDINGS: Soft tissue swelling. No radiopaque foreign body. No acute bony abnormality. No evidence of fracture or dislocation. Osteopenia. Mild degenerative changes radiocarpal joint and first carpometacarpal joint. No evidence of inflammatory arthropathy. IMPRESSION: Soft tissue swelling. No radiopaque foreign body. Osteopenia mild degenerative changes. No acute abnormality. No evidence of inflammatory arthropathy. Electronically Signed   By: Marcello Moores  Register   On: 07/30/2019 15:03   Mr Angio Head Wo Contrast  Result Date: 07/29/2019 CLINICAL DATA:  Stroke EXAM: MRA HEAD WITHOUT CONTRAST TECHNIQUE: Angiographic images of the Circle of Willis were obtained using MRA technique without intravenous contrast. COMPARISON:  MRI head 07/27/2019 FINDINGS: Image quality degraded by extensive motion. This examination  is significantly limited in quality by motion. Both vertebral arteries are patent. The basilar is patent. Posterior cerebral arteries patent bilaterally. Internal carotid artery patent bilaterally. Signal loss in the cavernous carotid bilaterally is likely artifactual. Anterior middle cerebral arteries appear patent bilaterally. Both anterior cerebral arteries supplied from the left. IMPRESSION: Negative for large vessel occlusion. Image quality is significantly degraded by extensive motion. Electronically Signed   By: Franchot Gallo M.D.   On: 07/29/2019 20:51   Scheduled Meds: . aspirin EC  81 mg Oral Daily  . atorvastatin  10 mg Oral q1800  . [START ON 08/01/2019] enoxaparin (LOVENOX) injection  40 mg Subcutaneous Q24H  . escitalopram  5 mg Oral Daily   Continuous Infusions: . 0.45 % NaCl with KCl 20 mEq / L 75 mL/hr at 07/31/19 0943  . magnesium sulfate bolus IVPB       LOS: 5 days   Time spent: over 30 min   Deatra James, MD [facility] Pager AMION  If 7PM-7AM,  please contact night-coverage [email or web address] Password [email or web address] 07/31/2019, 1:38 PM

## 2019-07-31 NOTE — TOC Progression Note (Signed)
Transition of Care [facility]) - Progression Note    Patient Details  Name: REXANN LUERAS MRN: [id number] Date of Birth: [birth year]  Transition of Care [facility]) CM/SW [place], [place] Phone Number: 07/31/2019, 1:05 PM  Clinical Narrative:     CSW received a phone call from [facility]. They stated that they have received the clinicals. They stated that they will need PT/OT notes to reflect active participation in therapy. Also, they need it stated in a note pain will not be an issue for the patient once she leaves the hospital.   CSW has informed the MD. Once PT/OT works with the patient, [place] will send updated clinicals to [facility].   CSW will continue to follow.   Expected Discharge Plan: Skilled Nursing Facility Barriers to Discharge: Ship broker, Continued Medical Work up  Expected Discharge Plan and Services Expected Discharge Plan: [place] In-house Referral: Clinical Social Work   Post Acute Care Choice: [place] Living arrangements for the past 2 months: Single Family Home                 DME Arranged: N/A DME Agency: NA       HH Arranged: NA HH Agency: NA         Social Determinants of Health (SDOH) Interventions    Readmission Risk Interventions No flowsheet data found.

## 2019-07-31 NOTE — Progress Notes (Signed)
CSW faxed clinicals to [facility] for insurance authorization.   CSW will continue to follow and assist.   Domenic Schwab, MSW, [place] Worker [facility]  [phone]

## 2019-08-01 LAB — COMPREHENSIVE METABOLIC PANEL
ALT: 32 U/L (ref 0–44)
AST: 50 U/L — ABNORMAL HIGH (ref 15–41)
Albumin: 2.1 g/dL — ABNORMAL LOW (ref 3.5–5.0)
Alkaline Phosphatase: 54 U/L (ref 38–126)
Anion gap: 9 (ref 5–15)
BUN: 15 mg/dL (ref 8–23)
CO2: 26 mmol/L (ref 22–32)
Calcium: 8.2 mg/dL — ABNORMAL LOW (ref 8.9–10.3)
Chloride: 106 mmol/L (ref 98–111)
Creatinine, Ser: 1.09 mg/dL — ABNORMAL HIGH (ref 0.44–1.00)
GFR calc Af Amer: 54 mL/min — ABNORMAL LOW (ref >60)
GFR calc non Af Amer: 46 mL/min — ABNORMAL LOW (ref >60)
Glucose, Bld: 103 mg/dL — ABNORMAL HIGH (ref 70–99)
Potassium: 5.7 mmol/L — ABNORMAL HIGH (ref 3.5–5.1)
Sodium: 141 mmol/L (ref 135–145)
Total Bilirubin: 1.1 mg/dL (ref 0.3–1.2)
Total Protein: 3.7 g/dL — ABNORMAL LOW (ref 6.5–8.1)

## 2019-08-01 LAB — CBC
HCT: 34.3 % — ABNORMAL LOW (ref 36.0–46.0)
Hemoglobin: 10.8 g/dL — ABNORMAL LOW (ref 12.0–15.0)
MCH: 28.2 pg (ref 26.0–34.0)
MCHC: 31.5 g/dL (ref 30.0–36.0)
MCV: 89.6 fL (ref 80.0–100.0)
Platelets: 273 10*3/uL (ref 150–400)
RBC: 3.83 MIL/uL — ABNORMAL LOW (ref 3.87–5.11)
RDW: 14.7 % (ref 11.5–15.5)
WBC: 8.6 10*3/uL (ref 4.0–10.5)
nRBC: 0 % (ref 0.0–0.2)

## 2019-08-01 LAB — URINALYSIS, ROUTINE W REFLEX MICROSCOPIC
Bacteria, UA: NONE SEEN
Bilirubin Urine: NEGATIVE
Glucose, UA: NEGATIVE mg/dL
Ketones, ur: NEGATIVE mg/dL
Nitrite: POSITIVE — AB
Protein, ur: NEGATIVE mg/dL
Specific Gravity, Urine: 1.014 (ref 1.005–1.030)
WBC, UA: >50 WBC/hpf — ABNORMAL HIGH (ref 0–5)
pH: 5 (ref 5.0–8.0)

## 2019-08-01 MED ORDER — SODIUM POLYSTYRENE SULFONATE 15 GM/60ML PO SUSP
30.0000 g | Freq: Once | ORAL | Status: DC
  Filled 2019-08-01: qty 120

## 2019-08-01 MED ORDER — PHENAZOPYRIDINE HCL 200 MG PO TABS: 200.0000 mg | ORAL_TABLET | Freq: Three times a day (TID) | ORAL | Status: DC

## 2019-08-01 MED ORDER — ESCITALOPRAM OXALATE 10 MG PO TABS
20.0000 mg | ORAL_TABLET | Freq: Every day | ORAL | Status: DC
  Administered 2019-08-01 – 2019-08-07 (×7): 20 mg via ORAL
  Filled 2019-08-01 (×7): qty 2

## 2019-08-01 MED ORDER — SODIUM CHLORIDE 0.9 % IV SOLN
INTRAVENOUS | Status: AC
  Administered 2019-08-01: 10:00:00 via INTRAVENOUS

## 2019-08-01 NOTE — Progress Notes (Addendum)
Update: CSW faxed updated MD progress notes to [facility]. CSW will need to fax updated PT/OT notes once they have seen the patient.   Domenic Schwab, MSW, LCSW-A Clinical Social Worker [facility]  [phone]    CSW received a fax from [facility] requesting updated PT/OT notes and MD progression notes.   CSW called RN and asked if she could request PT/OT work with the patient again so that she could send updated requested notes.   CSW will continue to follow.   Domenic Schwab, MSW, [place] Worker [facility]  [phone]

## 2019-08-01 NOTE — Progress Notes (Signed)
Patient states she has lost her glasses and shoes. Nurse checked closet and all patient belongings. States the last place she saw them was [facility]. Nurse called [facility], stated they would check to see if they were in lost and found. Glasses are thin with silhouette shape. Shoes are Keds, blue, size 8. [place]

## 2019-08-01 NOTE — TOC Progression Note (Signed)
Transition of Care [facility]) - Progression Note    Patient Details  Name: Angelica Bell MRN: [id number] Date of Birth: [birth year]  Transition of Care [facility]) CM/SW [place], [place] Phone Number: 08/01/2019, 2:44 PM  Clinical Narrative:     CSW received a phone call from [facility] asking for updated PT/OT notes. CSW informed them that they have not been updated since Friday but she would send them as soon as they became available.   [place] is also requesting a peer to peer review. The MD will need to call [phone] and select option 5. This needs to be completed no later than 10:00am on Monday. CSW has relayed this information to the doctor.   CSW will continue to follow.   Expected Discharge Plan: Skilled Nursing Facility Barriers to Discharge: Ship broker, Continued Medical Work up  Expected Discharge Plan and Services Expected Discharge Plan: [place] In-house Referral: Clinical Social Work   Post Acute Care Choice: [place] Living arrangements for the past 2 months: Single Family Home                 DME Arranged: N/A DME Agency: NA       HH Arranged: NA HH Agency: NA         Social Determinants of Health (SDOH) Interventions    Readmission Risk Interventions No flowsheet data found.

## 2019-08-01 NOTE — Progress Notes (Signed)
Dr. Roger Shelter paged. Pt complaining of a burning sensation while urinating. Awaiting orders. Nurse will continue to monitor. [place]

## 2019-08-01 NOTE — Progress Notes (Signed)
PROGRESS NOTE    ADLINE KIRSHENBAUM  VFI:[id number] DOB: 10/10/1932 DOA: 07/25/2019 PCP: Heywood Bene, PA-C   Brief Narrative:  Per HPI and previous PN [name] a87 y.o.female,whypertension, ? Rheumatoid arthritis (found in epic review), osteoporosis, scoliosis, chronic back pain, h/o PE (?? In our medical records in [facility] but can't find CTA chest or in Epic everywhere) , h/o cellulitis, presents with c/o failure to thrive, recurrent falls at home. Pt states that her poor po intake is due to not being able to get food. She was admitted with failure to thrive and recurrent falls.  She was noted to have LUE weakness on 7/28 and MRI was performed notable for acute scattered small infarctions.  Neurology was c/s who [facility] to [facility].  Subjective:  The patient was seen and examined this morning, stable no progression of her right-sided weakness, dense left upper extremity weakness, able to move her left lower extremity.  Speech remains intact. Denies of having any headaches or visual changes.  Reporting improved nausea.  Attempting to eat this morning. Complaining of dysuria and burning with urination.   Assessment & Plan:   Principal Problem:   ARF (acute renal failure) (HCC) Active Problems:   Hypokalemia   Essential hypertension   Hyponatremia   Abnormal liver function   Cerebrovascular accident (CVA) ([place])  Acute Stroke  LUE Weakness:  -No changes, persistent left-sided weakness, with no progression of her symptoms -Continue to monitor closely, neurochecks  CVA with evidence of acute small infarctions scattered within the white matter of the cerebral hemispheres (see report - favored hypoperfusion/watershed, embolic possible). Neurology following - appreciate insight and recs -MRA negative for large occlusion but limited by motion artifact PT/OT/SLP continue to follow -patient's continues to have persistent nausea  -we will attempt to  Advance her diet  .  -speech recommending regular diet thin liquids Echo without overt PFO or thrombus although stopped early due to patient request Carotid dopplers with 1-39% stenosis bilaterally with antegrade flow in bilateral vertebral arteries Aspirin, lipitor ongoing LDL 58, HDL 38. A1c 5.9.  Dysuria  -We will assess UA with reflex, cultures if needed, initiating antibiotics if needed.  failure to thrive, ongoing:   -Continues to have poor p.o. intake, persistent nausea -We will continue PRN antiemetics, encouraging p.o. intake, nutrition consult  -Per family: patient remains in bed for 10 to 14 hours a day has chronic weakness, is minimally interactive with family and has appeared to have "given up" per previous discussions. PT/OT-recommending SNF, defer to case management for placement options  Acute renal failure  Elevated CK, mild rhabdo  NAGMA, resolved -Stable, Baseline creatinine ~1.  Peaked at 2.76. - now 0.9 CK elevated in this pt found on floor for about 12 hours, will trend with IVF given poor PO status  Hypokalemia, severe, improved Repleted previously - now borderline low again likely 2/2 poor PO intake Continue to monitor  Abnormal liver function, resolved Improving with hydration Acute hepatitis panel unremarkable Ck elevated CMP in AM  Hypertension Monitor bp  Chronic back pain Cont oxycodone   DVT prophylaxis: lovenox Code Status: full Family Communication: discussed with grandson Disposition Plan: Status post PT/OT evaluation, recommending SNF, appreciate social worker Education officer, museum, and SNF search  Consultants:   neurology  ----------------------------------------------------------------------------------------------------------------------------------------------------   Objective: BP 104/68 (BP Location: Right Arm)   Pulse 78   Temp 97.6 F (36.4 C) (Axillary)   Resp 19   Ht 5\' 6"  (1.676 m)   Wt 55.4  kg   SpO2 100%   BMI  19.71 kg/m    Physical Exam  Stable no chabges Constitution: Awake alert cooperative, improved nausea complaining of dysuria. Psychiatric: Normal and stable mood and affect, cognition intact,   HEENT: Normocephalic, PERRL, otherwise with in Normal limits  Chest:Chest symmetric Cardio vascular:  S1/S2, RRR, No murmure, No Rubs or Gallops  pulmonary: Clear to auscultation bilaterally, respirations unlabored, negative wheezes / crackles Abdomen: Soft, non-tender, non-distended, bowel sounds,no masses, no organomegaly Muscular skeletal:  More cooperative, able to move right upper lower extremity without hesitation.- in bed, ,  Neuro: No changes, CNII-XII intact. ,  Left upper extremity strength 2 out of 5 left lower extremity strength 3-5, mild reduced sensation on the left, facial asymmetry, Extremities: No pitting edema lower extremities, +2 pulses  Skin: Dry, warm to touch, negative for any Rashes, No open wounds Wounds: per nursing documentation    Data Reviewed: I have personally reviewed following labs and imaging studies  CBC: Recent Labs  Lab 07/25/19 2338  07/28/19 0544 07/29/19 0527 07/30/19 0544 07/31/19 0337 08/01/19 0835  WBC 16.3*   < > 8.9 7.6 10.2 9.9 8.6  NEUTROABS 12.9*  --  5.9  --   --   --   --   HGB 13.8   < > 11.0* 10.3* 13.3 11.9* 10.8*  HCT 42.3   < > 35.9* 33.6* 40.4 36.0 34.3*  MCV 84.9   < > 91.1 89.8 86.0 85.9 89.6  PLT 268   < > 234 206 297 288 273   < > = values in this interval not displayed.   Basic Metabolic Panel: Recent Labs  Lab 07/25/19 2338  07/28/19 0544 07/29/19 0527 07/30/19 0544 07/31/19 0337 07/31/19 0929 08/01/19 0558  NA 134*   < > 144 143 144 141  --  141  K 2.4*   < > 4.0 3.5 3.3* 3.1*  --  5.7*  CL 95*   < > 117* 116* 106 108  --  106  CO2 24   < > 20* 22 23 25   --  26  GLUCOSE 119*   < > 83 96 92 120*  --  103*  BUN 61*   < > 28* 18 12 10   --  15  CREATININE 2.76*   < > 1.12* 0.91 1.04* 0.90  --  1.09*  CALCIUM 8.8*    < > 8.1* 8.1* 8.8* 8.3*  --  8.2*  MG 2.1  --   --  1.4* 1.3*  --  1.2*  --    < > = values in this interval not displayed.   GFR: Estimated Creatinine Clearance: 33 mL/min (A) (by C-G formula based on SCr of 1.09 mg/dL (H)). Liver Function Tests: Recent Labs  Lab 07/28/19 0544 07/29/19 0527 07/30/19 0544 07/31/19 0337 08/01/19 0558  AST 52* 35 46* 33 50*  ALT 52* 42 51* 40 32  ALKPHOS 54 53 74 62 54  BILITOT 0.6 0.4 1.0 0.5 1.1  PROT 4.9* 4.4* 5.1* 4.6* 3.7*  ALBUMIN 2.5* 2.2* 2.6* 2.4* 2.1*   No results for input(s): LIPASE, AMYLASE in the last 168 hours. No results for input(s): AMMONIA in the last 168 hours. Coagulation Profile: No results for input(s): INR, PROTIME in the last 168 hours. Cardiac Enzymes: Recent Labs  Lab 07/26/19 0825 07/27/19 1343 07/27/19 2019 07/28/19 0544 07/29/19 0527 07/31/19 0929  CKTOTAL 2,584* 1,367* 832* 547* 268* 88  CKMB 34.6*  --   --   --   --  1.7   HbA1C: No results for input(s): HGBA1C in the last 72 hours. Lipid Profile: No results for input(s): CHOL, HDL, LDLCALC, TRIG, CHOLHDL, LDLDIRECT in the last 72 hours. Recent Results (from the past 240 hour(s))  SARS Coronavirus 2 (CEPHEID - Performed in [place] hospital lab), Hosp Order     Status: None   Collection Time: 07/26/19  3:02 AM   Specimen: Nasopharyngeal Swab  Result Value Ref Range Status   SARS Coronavirus 2 NEGATIVE NEGATIVE Final    Comment: (NOTE) If result is NEGATIVE SARS-CoV-2 target nucleic acids are NOT DETECTED. The SARS-CoV-2 RNA is generally detectable in upper and lower  respiratory specimens during the acute phase of infection. The lowest  concentration of SARS-CoV-2 viral copies this assay can detect is 250  copies / mL. A negative result does not preclude SARS-CoV-2 infection  and should not be used as the sole basis for treatment or other  patient management decisions.  A negative result may occur with  improper specimen collection / handling,  submission of specimen other  than nasopharyngeal swab, presence of viral mutation(s) within the  areas targeted by this assay, and inadequate number of viral copies  (<250 copies / mL). A negative result must be combined with clinical  observations, patient history, and epidemiological information. If result is POSITIVE SARS-CoV-2 target nucleic acids are DETECTED. The SARS-CoV-2 RNA is generally detectable in upper and lower  respiratory specimens dur ing the acute phase of infection.  Positive  results are indicative of active infection with SARS-CoV-2.  Clinical  correlation with patient history and other diagnostic information is  necessary to determine patient infection status.  Positive results do  not rule out bacterial infection or co-infection with other viruses. If result is PRESUMPTIVE POSTIVE SARS-CoV-2 nucleic acids MAY BE PRESENT.   A presumptive positive result was obtained on the submitted specimen  and confirmed on repeat testing.  While 2019 novel coronavirus  (SARS-CoV-2) nucleic acids may be present in the submitted sample  additional confirmatory testing may be necessary for epidemiological  and / or clinical management purposes  to differentiate between  SARS-CoV-2 and other Sarbecovirus currently known to infect humans.  If clinically indicated additional testing with an alternate test  methodology [phone]) is advised. The SARS-CoV-2 RNA is generally  detectable in upper and lower respiratory sp ecimens during the acute  phase of infection. The expected result is Negative. Fact Sheet for Patients:  StrictlyIdeas.no Fact Sheet for Healthcare Providers: BankingDealers.co.za This test is not yet approved or cleared by the [place] FDA and has been authorized for detection and/or diagnosis of SARS-CoV-2 by FDA under an Emergency Use Authorization (EUA).  This EUA will remain in effect (meaning this test can be  used) for the duration of the COVID-19 declaration under Section 564(b)(1) of the Act, 21 U.S.C. section 360bbb-3(b)(1), unless the authorization is terminated or revoked sooner. Performed at [facility], [place] [address], [place],  [address]          Radiology Studies: Dg Wrist 2 Views Left  Result Date: 07/30/2019 CLINICAL DATA:  Acute left wrist pain. EXAM: LEFT WRIST - 2 VIEW COMPARISON:  No recent prior. FINDINGS: Soft tissue swelling. No radiopaque foreign body. No acute bony abnormality. No evidence of fracture or dislocation. Osteopenia. Mild degenerative changes radiocarpal joint and first carpometacarpal joint. No evidence of inflammatory arthropathy. IMPRESSION: Soft tissue swelling. No radiopaque foreign body. Osteopenia mild degenerative changes. No acute abnormality. No evidence of inflammatory  arthropathy. Electronically Signed   By: Marcello Moores  Register   On: 07/30/2019 15:03   Scheduled Meds: . aspirin EC  81 mg Oral Daily  . atorvastatin  10 mg Oral q1800  . enoxaparin (LOVENOX) injection  40 mg Subcutaneous Q24H  . escitalopram  20 mg Oral Daily  . sodium polystyrene  30 g Oral Once   Continuous Infusions: . sodium chloride 50 mL/hr at 08/01/19 1001  . magnesium sulfate bolus IVPB       LOS: 6 days   Time spent: over 30 min   Deatra James, MD [facility] Pager AMION  If 7PM-7AM, please contact night-coverage [email or web address] Password [email or web address] 08/01/2019, 12:11 PM

## 2019-08-02 LAB — COMPREHENSIVE METABOLIC PANEL
ALT: 35 U/L (ref 0–44)
AST: 36 U/L (ref 15–41)
Albumin: 2.4 g/dL — ABNORMAL LOW (ref 3.5–5.0)
Alkaline Phosphatase: 59 U/L (ref 38–126)
Anion gap: 6 (ref 5–15)
BUN: 13 mg/dL (ref 8–23)
CO2: 29 mmol/L (ref 22–32)
Calcium: 8.7 mg/dL — ABNORMAL LOW (ref 8.9–10.3)
Chloride: 106 mmol/L (ref 98–111)
Creatinine, Ser: 1 mg/dL (ref 0.44–1.00)
GFR calc Af Amer: 59 mL/min — ABNORMAL LOW (ref >60)
GFR calc non Af Amer: 51 mL/min — ABNORMAL LOW (ref >60)
Glucose, Bld: 100 mg/dL — ABNORMAL HIGH (ref 70–99)
Potassium: 4.1 mmol/L (ref 3.5–5.1)
Sodium: 141 mmol/L (ref 135–145)
Total Bilirubin: 0.4 mg/dL (ref 0.3–1.2)
Total Protein: 4.5 g/dL — ABNORMAL LOW (ref 6.5–8.1)

## 2019-08-02 LAB — CBC
HCT: 34.6 % — ABNORMAL LOW (ref 36.0–46.0)
Hemoglobin: 11 g/dL — ABNORMAL LOW (ref 12.0–15.0)
MCH: 28.2 pg (ref 26.0–34.0)
MCHC: 31.8 g/dL (ref 30.0–36.0)
MCV: 88.7 fL (ref 80.0–100.0)
Platelets: 282 10*3/uL (ref 150–400)
RBC: 3.9 MIL/uL (ref 3.87–5.11)
RDW: 14.5 % (ref 11.5–15.5)
WBC: 7.3 10*3/uL (ref 4.0–10.5)
nRBC: 0 % (ref 0.0–0.2)

## 2019-08-02 MED ORDER — CIPROFLOXACIN IN D5W 400 MG/200ML IV SOLN
400.0000 mg | Freq: Two times a day (BID) | INTRAVENOUS | Status: DC
  Administered 2019-08-02 – 2019-08-03 (×4): 400 mg via INTRAVENOUS
  Filled 2019-08-02 (×4): qty 200

## 2019-08-02 MED ORDER — TRAZODONE HCL 50 MG PO TABS
50.0000 mg | ORAL_TABLET | Freq: Once | ORAL | Status: AC
  Administered 2019-08-02: 22:00:00 50 mg via ORAL
  Filled 2019-08-02: qty 1

## 2019-08-02 NOTE — Progress Notes (Signed)
Occupational Therapy Treatment Patient Details Name: Angelica Bell MRN: [id number] DOB: 1932/10/22 Today's Date: 08/02/2019    History of present illness Pt is an 85 y/o female admitted after falling in the bathroom, recurrent falls, and FTT. Xrays negative for fracture.  CT of the head for AMS demonstrated chronic ischemic changes only. However, her MRI brain revealed scattered acute small infarctions primarily within the white matter of the cerebral hemispheres but with some cortical involvement most notably in the left occipital region.  This is concerning for possible embolic infarctions. PMH includes:  RA, HTN, osteoporosis, scoliosis, chronic back pain, h/o PE, h/o cellulitis, s/p Rt THA    OT comments  This 85 yo female admitted with above presents to acute OT with making progress in bed mobility, sit<>stand, ambulating, grooming, and toilet transfers. She is limited by back pain today and reports in the past she had a brace that really helped (I chat texted MD and he is ordering her one). She will continue to benefit from acute OT with follow up OT at SNF.  Follow Up Recommendations  SNF;Supervision/Assistance - 24 hour    Equipment Recommendations  Other (comment)(TBD at next venue)       Precautions / Restrictions Precautions Precautions: Fall Precaution Comments: Pt confused Restrictions Weight Bearing Restrictions: No       Mobility Bed Mobility Overal bed mobility: Needs Assistance Bed Mobility: Supine to Sit     Supine to sit: Min guard        Transfers Overall transfer level: Needs assistance Equipment used: Rolling walker (2 wheeled) Transfers: Sit to/from Stand Sit to Stand: Min guard;+2 safety/equipment         General transfer comment: +2 Mod A ambulation (8 feet x2)--had to sit due to back pain    Balance Overall balance assessment: Needs assistance;No apparent balance deficits (not formally assessed) Sitting-balance support: No upper extremity  supported;Feet supported Sitting balance-Leahy Scale: Fair     Standing balance support: Bilateral upper extremity supported Standing balance-Leahy Scale: Poor                             ADL either performed or assessed with clinical judgement   ADL Overall ADL's : Needs assistance/impaired     Grooming: Brushing hair;Sitting;Minimal assistance                   Toilet Transfer: Moderate assistance;+2 for physical assistance;RW Toilet Transfer Details (indicate cue type and reason): Requires A for guiding RW and physical A for standing as well Toileting- Clothing Manipulation and Hygiene: Total assistance Toileting - Clothing Manipulation Details (indicate cue type and reason): Mod A to maintain standing             Vision Baseline Vision/History: Wears glasses Wears Glasses: At all times            Cognition Arousal/Alertness: Awake/alert Behavior During Therapy: Anxious(due to back pain) Overall Cognitive Status: Impaired/Different from baseline Area of Impairment: Following commands;Attention;Safety/judgement;Problem solving                   Current Attention Level: Sustained Memory: Decreased short-term memory Following Commands: Follows one step commands consistently;Follows one step commands with increased time Safety/Judgement: Decreased awareness of safety   Problem Solving: Difficulty sequencing;Requires verbal cues          Exercises Other Exercises Other Exercises: RN reported that pt wanted exercises to do with her LUE due to it  is weak. I shoulder her 3 (wrist flexion/ext, forearm supination/pronation, and picking up travel size shampoo bottle with left and and placing it in right hand then grabbing it back out with left hand). Pt does these with S.           Pertinent Vitals/ Pain       Pain Assessment: 0-10 Pain Score: 9  Pain Location: back Pain Descriptors / Indicators: Guarding;Grimacing;Discomfort;Aching Pain  Intervention(s): Limited activity within patient's tolerance;Monitored during session;Premedicated before session;Heat applied;Patient requesting pain meds-RN notified(30 minutes before pain meds due per RN)         Frequency  Min 2X/week        Progress Toward Goals  OT Goals(current goals can now be found in the care plan section)  Progress towards OT goals: Progressing toward goals     Plan Discharge plan remains appropriate    Co-evaluation    PT/OT/SLP Co-Evaluation/Treatment: Yes Reason for Co-Treatment: To address functional/ADL transfers;For patient/therapist safety;Complexity of the patient's impairments (multi-system involvement) PT goals addressed during session: Mobility/safety with mobility;Balance;Proper use of DME;Strengthening/ROM OT goals addressed during session: ADL's and self-care;Strengthening/ROM      AM-PAC OT "6 Clicks" Daily Activity     Outcome Measure   Help from another person eating meals?: A Little Help from another person taking care of personal grooming?: A Lot Help from another person toileting, which includes using toliet, bedpan, or urinal?: A Lot Help from another person bathing (including washing, rinsing, drying)?: A Lot Help from another person to put on and taking off regular upper body clothing?: A Lot Help from another person to put on and taking off regular lower body clothing?: Total 6 Click Score: 12    End of Session Equipment Utilized During Treatment: Gait belt;Rolling walker  OT Visit Diagnosis: Unsteadiness on feet (R26.81);Other abnormalities of gait and mobility (R26.89);Pain;Hemiplegia and hemiparesis Symptoms and signs involving cognitive functions: Cerebral infarction Hemiplegia - Right/Left: Left Hemiplegia - dominant/non-dominant: Non-Dominant Hemiplegia - caused by: Cerebral infarction Pain - part of body: (back)   Activity Tolerance Patient limited by pain(but did more than on last session)   Patient Left  in chair;with call bell/phone within reach;with chair alarm set   Nurse Communication Mobility status;Patient requests pain meds(heat applied to back)        Time: 1021-1050 OT Time Calculation (min): 29 min  Charges: OT General Charges $OT Visit: 1 Visit OT Treatments $Self Care/Home Management : 8-22 mins  [place], OTR/L Acute [employer] Pager [phone] Office [phone]      [place] Register 08/02/2019, 11:12 AM

## 2019-08-02 NOTE — Progress Notes (Signed)
PROGRESS NOTE    Angelica Bell  LDJ:[id number] DOB: [birth year] DOA: 07/25/2019 PCP: Heywood Bene, PA-C   Brief Narrative:  Per HPI and previous PN [name] a87 y.o.female,whypertension, ? Rheumatoid arthritis (found in epic review), osteoporosis, scoliosis, chronic back pain, h/o PE (?? In our medical records in [facility] but can't find CTA chest or in Epic everywhere) , h/o cellulitis, presents with c/o failure to thrive, recurrent falls at home. Pt states that her poor po intake is due to not being able to get food. She was admitted with failure to thrive and recurrent falls.  She was noted to have LUE weakness on 7/28 and MRI was performed notable for acute scattered small infarctions.  Neurology was c/s who [facility].  Subjective:  The patient was seen and examined this morning, stable mildly confused.  No issues overnight.  She is complaining of generalized aches and pain, no specific dysuria. UA reveals likely urinary tract infection, antibiotics was initiated this morning. Reporting some mild back pain, still having dense left-sided weakness with no improvement.  Assessment & Plan:   Principal Problem:   ARF (acute renal failure) (HCC) Active Problems:   Hypokalemia   Essential hypertension   Hyponatremia   Abnormal liver function   Cerebrovascular accident (CVA) ([place])  Acute Stroke  LUE Weakness:  -No changes, persistent left-sided weakness no progression of symptoms.,   -Continue to monitor closely, neurochecks  CVA with evidence of acute small infarctions scattered within the white matter of the cerebral hemispheres (see report - favored hypoperfusion/watershed, embolic possible). Neurology following - appreciate insight and recs -MRA negative for large occlusion but limited by motion artifact PT/OT/SLP continue to follow -patient's continues to have persistent nausea  -we will attempt to  Advance her diet .  -speech recommending  regular diet thin liquids Echo without overt PFO or thrombus although stopped early due to patient request Carotid dopplers with 1-39% stenosis bilaterally with antegrade flow in bilateral vertebral arteries Aspirin, lipitor ongoing LDL 58, HDL 38. A1c 5.9.  Urine tract infection dysuria  -UA finding consistent with urinary tract infection -Cultures will be obtained -The patient started on antibiotics of ciprofloxacin 08/02/2019  failure to thrive, ongoing:   -Continues to have poor p.o. intake, persistent nausea -We will continue PRN antiemetics, encouraging p.o. intake, nutrition consult  -Per family: patient remains in bed for 10 to 14 hours a day has chronic weakness, is minimally interactive with family and has appeared to have "given up" per previous discussions. PT/OT-recommending SNF, defer to case management for placement options  Acute renal failure  Elevated CK, mild rhabdo  NAGMA, resolved -Stable, continue to monitor Baseline creatinine ~1.  Peaked at 2.76. - now 0.9 CK elevated in this pt found on floor for about 12 hours, will trend with IVF given poor PO status  Hypokalemia, severe, improved Repleted previously  - now borderline low again likely 2/2 poor PO intake Continue to monitor  Abnormal liver function, resolved Much improved with hydration Acute hepatitis panel unremarkable Ck elevated CMP in AM  Hypertension Monitor bp  Chronic back pain Cont oxycodone as needed, Per PT recommendation and OT recommendation lumbar back brace ordered  DVT prophylaxis: lovenox Code Status: full Family Communication: discussed with grandson Disposition Plan: Status post PT/OT evaluation, recommending SNF, appreciate social worker assist pending insurance approval, and SNF search  Consultants:   Neurology  Cultures; Urine cultures 08/02/2019  Antimicrobial antibiotics of ciprofloxacin 08/02/2019   ----------------------------------------------------------------------------------------------------------------------------------------------------  BP (!) 139/58 (BP  Location: Right Arm)   Pulse 69   Temp 99.1 F (37.3 C) (Oral)   Resp 19   Ht 5\' 6"  (1.676 m)   Wt 55.5 kg   SpO2 96%   BMI 19.75 kg/m    Physical Exam  Constitution:  Alert, cooperative, no distress,  Psychiatric: Normal and stable mood and affect, cognition intact,   HEENT: Normocephalic, PERRL, otherwise with in Normal limits  Chest:Chest symmetric Cardio vascular:  S1/S2, RRR, No murmure, No Rubs or Gallops  pulmonary: Clear to auscultation bilaterally, respirations unlabored, negative wheezes / crackles Abdomen: Soft, non-tender, non-distended, bowel sounds,no masses, no organomegaly Muscular skeletal: Limited exam - in bed, able to move all 4 extremities, Normal strength,  Neuro: CNII-XII intact. , normal motor and sensation, reflexes intact  Extremities: No pitting edema lower extremities, +2 pulses  Skin: Dry, warm to touch, negative for any Rashes, No open wounds Wounds: per nursing documentation Neuro: No changes, CNII-XII intact. ,  Left upper extremity strength 2 out of 5 left lower extremity strength 3-5, mild reduced sensation on the left, facial asymmetry, right-sided strength intact sensation intact, right facial droop Extremities: No pitting edema lower extremities, +2 pulses  Skin: Dry, warm to touch, negative for any Rashes, No open wounds Wounds: per nursing documentation    Data Reviewed: I have personally reviewed following labs and imaging studies  CBC: Recent Labs  Lab 07/28/19 0544 07/29/19 0527 07/30/19 0544 07/31/19 0337 08/01/19 0835 08/02/19 0745  WBC 8.9 7.6 10.2 9.9 8.6 7.3  NEUTROABS 5.9  --   --   --   --   --   HGB 11.0* 10.3* 13.3 11.9* 10.8* 11.0*  HCT 35.9* 33.6* 40.4 36.0 34.3* 34.6*  MCV 91.1 89.8 86.0 85.9 89.6 88.7  PLT 234 206 297 288 273 497   Basic  Metabolic Panel: Recent Labs  Lab 07/29/19 0527 07/30/19 0544 07/31/19 0337 07/31/19 0929 08/01/19 0558 08/02/19 0745  NA 143 144 141  --  141 141  K 3.5 3.3* 3.1*  --  5.7* 4.1  CL 116* 106 108  --  106 106  CO2 22 23 25   --  26 29  GLUCOSE 96 92 120*  --  103* 100*  BUN 18 12 10   --  15 13  CREATININE 0.91 1.04* 0.90  --  1.09* 1.00  CALCIUM 8.1* 8.8* 8.3*  --  8.2* 8.7*  MG 1.4* 1.3*  --  1.2*  --   --    GFR: Estimated Creatinine Clearance: 36 mL/min (by C-G formula based on SCr of 1 mg/dL). Liver Function Tests: Recent Labs  Lab 07/29/19 0527 07/30/19 0544 07/31/19 0337 08/01/19 0558 08/02/19 0745  AST 35 46* 33 50* 36  ALT 42 51* 40 32 35  ALKPHOS 53 74 62 54 59  BILITOT 0.4 1.0 0.5 1.1 0.4  PROT 4.4* 5.1* 4.6* 3.7* 4.5*  ALBUMIN 2.2* 2.6* 2.4* 2.1* 2.4*   No results for input(s): LIPASE, AMYLASE in the last 168 hours. No results for input(s): AMMONIA in the last 168 hours. Coagulation Profile: No results for input(s): INR, PROTIME in the last 168 hours. Cardiac Enzymes: Recent Labs  Lab 07/27/19 1343 07/27/19 2019 07/28/19 0544 07/29/19 0527 07/31/19 0929  CKTOTAL 1,367* 832* 547* 268* 88  CKMB  --   --   --   --  1.7   Recent Results (from the past 240 hour(s))  SARS Coronavirus 2 (CEPHEID - Performed in [facility] hospital lab), [facility]  Status: None   Collection Time: 07/26/19  3:02 AM   Specimen: Nasopharyngeal Swab  Result Value Ref Range Status   SARS Coronavirus 2 NEGATIVE NEGATIVE Final    Comment: (NOTE) If result is NEGATIVE SARS-CoV-2 target nucleic acids are NOT DETECTED. The SARS-CoV-2 RNA is generally detectable in upper and lower  respiratory specimens during the acute phase of infection. The lowest  concentration of SARS-CoV-2 viral copies this assay can detect is 250  copies / mL. A negative result does not preclude SARS-CoV-2 infection  and should not be used as the sole basis for treatment or other  patient management  decisions.  A negative result may occur with  improper specimen collection / handling, submission of specimen other  than nasopharyngeal swab, presence of viral mutation(s) within the  areas targeted by this assay, and inadequate number of viral copies  (<250 copies / mL). A negative result must be combined with clinical  observations, patient history, and epidemiological information. If result is POSITIVE SARS-CoV-2 target nucleic acids are DETECTED. The SARS-CoV-2 RNA is generally detectable in upper and lower  respiratory specimens dur ing the acute phase of infection.  Positive  results are indicative of active infection with SARS-CoV-2.  Clinical  correlation with patient history and other diagnostic information is  necessary to determine patient infection status.  Positive results do  not rule out bacterial infection or co-infection with other viruses. If result is PRESUMPTIVE POSTIVE SARS-CoV-2 nucleic acids MAY BE PRESENT.   A presumptive positive result was obtained on the submitted specimen  and confirmed on repeat testing.  While 2019 novel coronavirus  (SARS-CoV-2) nucleic acids may be present in the submitted sample  additional confirmatory testing may be necessary for epidemiological  and / or clinical management purposes  to differentiate between  SARS-CoV-2 and other Sarbecovirus currently known to infect humans.  If clinically indicated additional testing with an alternate test  methodology [phone]) is advised. The SARS-CoV-2 RNA is generally  detectable in upper and lower respiratory sp ecimens during the acute  phase of infection. The expected result is Negative. Fact Sheet for Patients:  StrictlyIdeas.no Fact Sheet for Healthcare Providers: BankingDealers.co.za This test is not yet approved or cleared by the [place] FDA and has been authorized for detection and/or diagnosis of SARS-CoV-2 by FDA under an  Emergency Use Authorization (EUA).  This EUA will remain in effect (meaning this test can be used) for the duration of the COVID-19 declaration under Section 564(b)(1) of the Act, 21 U.S.C. section 360bbb-3(b)(1), unless the authorization is terminated or revoked sooner. Performed at [facility], [place] [address] [place], [place] [address]          Radiology Studies: No results found. Scheduled Meds: . aspirin EC  81 mg Oral Daily  . atorvastatin  10 mg Oral q1800  . enoxaparin (LOVENOX) injection  40 mg Subcutaneous Q24H  . escitalopram  20 mg Oral Daily  . sodium polystyrene  30 g Oral Once   Continuous Infusions: . ciprofloxacin 400 mg (08/02/19 0815)  . magnesium sulfate bolus IVPB       LOS: 7 days   Time spent: over 30 min   Deatra James, MD [facility] Pager AMION  If 7PM-7AM, please contact night-coverage [email or web address] Password [email or web address] 08/02/2019, 10:59 AM

## 2019-08-02 NOTE — Progress Notes (Signed)
Orthopedic Tech Progress Note Patient Details:  Angelica Bell August 17, 1932 [id number]  Patient ID: Angelica Bell, female   DOB: 1932-05-23, 85 y.o.   MRN: [id number]   [name] 08/02/2019, 11:16 [facility] [employer] for Lumbar corset.

## 2019-08-02 NOTE — Progress Notes (Signed)
Patient is hallucinating. Pt says she sees 6 wasps on the ceiling. Dr. Roger Shelter notified. Nurse will continue to monitor. [place]

## 2019-08-02 NOTE — Progress Notes (Addendum)
Physical Therapy Treatment Patient Details Name: Angelica Bell MRN: [id number] DOB: 10-03-1932 Today's Date: 08/02/2019    History of Present Illness Pt is an 85 y/o female admitted after falling in the bathroom, recurrent falls, and FTT. Xrays negative for fracture.  CT of the head for AMS demonstrated chronic ischemic changes only. However, her MRI brain revealed scattered acute small infarctions primarily within the white matter of the cerebral hemispheres but with some cortical involvement most notably in the left occipital region.  This is concerning for possible embolic infarctions. PMH includes:  RA, HTN, osteoporosis, scoliosis, chronic back pain, h/o PE, h/o cellulitis, s/p Rt THA     PT Comments    Patient seen for mobility progression. Pt is making gradual progress toward PT goals and tolerated short distance gait. Mobility is limited by back pain and weakness. Continue to progress as tolerated with anticipated d/c to SNF for further skilled PT services.     Follow Up Recommendations  SNF     Equipment Recommendations  None recommended by PT    Recommendations for Other Services       Precautions / Restrictions Precautions Precautions: Fall Precaution Comments: Pt confused Restrictions Weight Bearing Restrictions: No    Mobility  Bed Mobility Overal bed mobility: Needs Assistance Bed Mobility: Supine to Sit     Supine to sit: Min guard     General bed mobility comments: increased time and effort; min guard for safety  Transfers Overall transfer level: Needs assistance Equipment used: Rolling walker (2 wheeled) Transfers: Sit to/from Stand Sit to Stand: Min guard;+2 safety/equipment         General transfer comment: min guard for safety when powering up into standing; increased time needed; cues for safe hand placement; pt unable to achieve upright posture due to pain  Ambulation/Gait Ambulation/Gait assistance: Mod assist;+2 safety/equipment Gait  Distance (Feet): (8 ft X 2 trials with seated rest break ) Assistive device: Rolling walker (2 wheeled) Gait Pattern/deviations: Step-through pattern;Decreased step length - right;Decreased step length - left;Trunk flexed     General Gait Details: assist for balance and guiding RW; cues for posture and safe use of AD   Stairs             Wheelchair Mobility    Modified Rankin (Stroke Patients Only) Modified Rankin (Stroke Patients Only) Pre-Morbid Rankin Score: No symptoms Modified Rankin: Moderately severe disability     Balance Overall balance assessment: Needs assistance;No apparent balance deficits (not formally assessed) Sitting-balance support: No upper extremity supported;Feet supported Sitting balance-Leahy Scale: Fair     Standing balance support: Bilateral upper extremity supported Standing balance-Leahy Scale: Poor                              Cognition Arousal/Alertness: Awake/alert Behavior During Therapy: Anxious(due to back pain) Overall Cognitive Status: Impaired/Different from baseline Area of Impairment: Following commands;Attention;Safety/judgement;Problem solving                   Current Attention Level: Sustained Memory: Decreased short-term memory Following Commands: Follows one step commands consistently;Follows one step commands with increased time Safety/Judgement: Decreased awareness of safety   Problem Solving: Difficulty sequencing;Requires verbal cues        Exercises Other Exercises Other Exercises: RN reported that pt wanted exercises to do with her LUE due to it is weak. I shoulder her 3 (wrist flexion/ext, forearm supination/pronation, and picking up travel size shampoo bottle with  left and and placing it in right hand then grabbing it back out with left hand). Pt does these with S.    General Comments        Pertinent Vitals/Pain Pain Assessment: 0-10 Pain Score: 9  Pain Location: back Pain  Descriptors / Indicators: Guarding;Grimacing;Discomfort;Aching Pain Intervention(s): Limited activity within patient's tolerance;Monitored during session;Repositioned;Patient requesting pain meds-RN notified;Other (comment)(RN reports 34mins before next pain medicine-pt notified)    Home Living                      Prior Function            PT Goals (current goals can now be found in the care plan section) Progress towards PT goals: Progressing toward goals    Frequency    Min 3X/week      PT Plan Current plan remains appropriate    Co-evaluation PT/OT/SLP Co-Evaluation/Treatment: Yes Reason for Co-Treatment: For patient/therapist safety;To address functional/ADL transfers PT goals addressed during session: Mobility/safety with mobility;Proper use of DME OT goals addressed during session: ADL's and self-care;Strengthening/ROM      AM-PAC PT "6 Clicks" Mobility   Outcome Measure  Help needed turning from your back to your side while in a flat bed without using bedrails?: A Lot Help needed moving from lying on your back to sitting on the side of a flat bed without using bedrails?: A Lot Help needed moving to and from a bed to a chair (including a wheelchair)?: A Lot Help needed standing up from a chair using your arms (e.g., wheelchair or bedside chair)?: A Little Help needed to walk in hospital room?: A Lot Help needed climbing 3-5 steps with a railing? : Total 6 Click Score: 12    End of Session Equipment Utilized During Treatment: Gait belt Activity Tolerance: Patient limited by pain Patient left: with call bell/phone within reach;in chair;with chair alarm set Nurse Communication: Mobility status PT Visit Diagnosis: Other abnormalities of gait and mobility (R26.89);Other symptoms and signs involving the nervous system (R29.898)     Time: 1021-1050 PT Time Calculation (min) (ACUTE ONLY): 29 min  Charges:  $Gait Training: 8-22 mins                      Earney Navy, PTA Acute Rehabilitation Services Pager: [phone] Office: [phone]     Darliss Cheney 08/02/2019, 11:24 AM

## 2019-08-02 NOTE — TOC Progression Note (Signed)
Transition of Care [facility]) - Progression Note    Patient Details  Name: Angelica Bell MRN: [id number] Date of Birth: 26-May-1932  Transition of Care [facility]) CM/SW [place], [place] Phone Number: 08/02/2019, 3:34 PM  Clinical Narrative:  CSW following for discharge plan. CSW spoke with MD earlier today, reminded him of peer to peer needing to be completed by 10 am today. MD indicated that he would call. CSW confirmed bed availability at [facility] for patient when [place] received.   CSW received call from [facility] later this afternoon that authorization was denied due to MD not completing the peer to peer that was requested. CSW will need to initiate an appeal now that patient has been denied. CSW to contact [facility] to initiate appeal of the denial.       Expected Discharge Plan: [place] Barriers to Discharge: Ship broker, Continued Medical Work up  Expected Discharge Plan and Services Expected Discharge Plan: [place] In-house Referral: Clinical Social Work   Post Acute Care Choice: [place] Living arrangements for the past 2 months: Single Family Home                 DME Arranged: N/A DME Agency: NA       HH Arranged: NA HH Agency: NA         Social Determinants of Health (SDOH) Interventions    Readmission Risk Interventions No flowsheet data found.

## 2019-08-03 DIAGNOSIS — R410 Disorientation, unspecified: Secondary | ICD-10-CM | POA: Diagnosis present

## 2019-08-03 DIAGNOSIS — N3000 Acute cystitis without hematuria: Secondary | ICD-10-CM | POA: Diagnosis not present

## 2019-08-03 LAB — CBC
HCT: 33.1 % — ABNORMAL LOW (ref 36.0–46.0)
Hemoglobin: 10.4 g/dL — ABNORMAL LOW (ref 12.0–15.0)
MCH: 28 pg (ref 26.0–34.0)
MCHC: 31.4 g/dL (ref 30.0–36.0)
MCV: 89.2 fL (ref 80.0–100.0)
Platelets: 277 10*3/uL (ref 150–400)
RBC: 3.71 MIL/uL — ABNORMAL LOW (ref 3.87–5.11)
RDW: 14.5 % (ref 11.5–15.5)
WBC: 6.4 10*3/uL (ref 4.0–10.5)
nRBC: 0 % (ref 0.0–0.2)

## 2019-08-03 LAB — BASIC METABOLIC PANEL
Anion gap: 8 (ref 5–15)
BUN: 17 mg/dL (ref 8–23)
CO2: 29 mmol/L (ref 22–32)
Calcium: 8.6 mg/dL — ABNORMAL LOW (ref 8.9–10.3)
Chloride: 103 mmol/L (ref 98–111)
Creatinine, Ser: 1.04 mg/dL — ABNORMAL HIGH (ref 0.44–1.00)
GFR calc Af Amer: 57 mL/min — ABNORMAL LOW (ref >60)
GFR calc non Af Amer: 49 mL/min — ABNORMAL LOW (ref >60)
Glucose, Bld: 177 mg/dL — ABNORMAL HIGH (ref 70–99)
Potassium: 3.9 mmol/L (ref 3.5–5.1)
Sodium: 140 mmol/L (ref 135–145)

## 2019-08-03 LAB — URINE CULTURE: Culture: <10000 — AB

## 2019-08-03 MED ORDER — HYDROMORPHONE HCL 1 MG/ML IJ SOLN
1.0000 mg | Freq: Once | INTRAMUSCULAR | Status: AC
  Administered 2019-08-03: 15:00:00 1 mg via INTRAVENOUS
  Filled 2019-08-03: qty 1

## 2019-08-03 NOTE — Progress Notes (Signed)
PROGRESS NOTE    Angelica Bell  GYK:[id number] DOB: 1932-01-03 DOA: 07/25/2019 PCP: Heywood Bene, PA-C   Brief Narrative:  Per HPI and previous PN [name] a87 y.o.female,whypertension, ? Rheumatoid arthritis (found in epic review), osteoporosis, scoliosis, chronic back pain, h/o PE (?? In our medical records in [facility] but can't find CTA chest or in Epic everywhere) , h/o cellulitis, presents with c/o failure to thrive, recurrent falls at home. Pt states that her poor po intake is due to not being able to get food. She was admitted with failure to thrive and recurrent falls.  She was noted to have LUE weakness on 7/28 and MRI was performed notable for acute scattered small infarctions.  Neurology was c/s who [facility].  Subjective:  The patient was seen and examined this morning, stable in bed in no acute distress, cooperative.  Overnight patient had some hallucination which has resolved.  Assessment & Plan:   Principal Problem:   ARF (acute renal failure) (HCC) Active Problems:   Hypokalemia   Essential hypertension   Hyponatremia   Abnormal liver function   Cerebrovascular accident (CVA) ([place])  Acute Stroke  LUE Weakness:  -No changes persistent left-sided weakness no progression of symptoms., Some mild improvement in strength of her left upper and lower extremity noted  -Continue to monitor closely, neurochecks  CVA with evidence of acute small infarctions scattered within the white matter of the cerebral hemispheres (see report - favored hypoperfusion/watershed, embolic possible). Neurology following - appreciate insight and recs -MRA negative for large occlusion but limited by motion artifact PT/OT/SLP continue to follow -patient's continues to have persistent nausea  -we will attempt to  Advance her diet .  -speech recommending regular diet thin liquids Echo without overt PFO or thrombus although stopped early due to patient request  Carotid dopplers with 1-39% stenosis bilaterally with antegrade flow in bilateral vertebral arteries Aspirin, lipitor ongoing LDL 58, HDL 38. A1c 5.9.  Urine tract infection dysuria  -UA finding consistent with urinary tract infection -Urine cultures >>  -The patient started on antibiotics of ciprofloxacin 08/02/2019  failure to thrive, ongoing:   -Continue to encourage p.o. intake -We will continue PRN antiemetics, encouraging p.o. intake, nutrition consult  -Per family: patient remains in bed for 10 to 14 hours a day has chronic weakness, is minimally interactive with family and has appeared to have "given up" per previous discussions. PT/OT-recommending SNF, defer to case management for placement options  Acute renal failure  Elevated CK, mild rhabdo  NAGMA, resolved -Stable improved Baseline creatinine ~1.  Peaked at 2.76. - now 0.9 CK elevated in this pt found on floor for about 12 hours, will trend with IVF given poor PO status  Hypokalemia, severe, improved Repleted previously  - now borderline low again likely 2/2 poor PO intake Continue to monitor  Abnormal liver function, resolved Much improved with hydration Acute hepatitis panel unremarkable Ck elevated CMP in AM  Hypertension -Monitoring blood pressure  Chronic back pain Cont oxycodone as needed, Per PT recommendation and OT recommendation lumbar back brace ordered  DVT prophylaxis: lovenox Code Status: full Family Communication: discussed with grandson Disposition Plan: Status post PT/OT evaluation, recommending SNF, appreciate social worker assist pending insurance approval, and SNF search  Consultants:   Neurology  Cultures; Urine cultures 08/02/2019  Antimicrobial antibiotics of ciprofloxacin 08/02/2019  ---------------------------------------------------------------------------------------------------------------------------------------------------- BP (!) 128/59 (BP Location: Left Arm)    Pulse 84   Temp (!) 97.5 F (36.4 C) (Axillary)   Resp  19   Ht 5\' 6"  (1.676 m)   Wt 55.3 kg   SpO2 94%   BMI 19.68 kg/m    Physical Exam  Constitution:  Alert, cooperative, no distress,  Psychiatric: Normal and stable mood and affect, cognition intact,   HEENT: Normocephalic, PERRL, otherwise with in Normal limits  Chest:Chest symmetric Cardio vascular:  S1/S2, RRR, No murmure, No Rubs or Gallops  pulmonary: Clear to auscultation bilaterally, respirations unlabored, negative wheezes / crackles Abdomen: Soft, non-tender, non-distended, bowel sounds,no masses, no organomegaly Muscular skeletal: Limited exam - in bed, able to move all 4 extremities, Normal strength,  Neuro: No changes, CNII-XII intact. ,  Left upper extremity strength 2 out of 5 left lower extremity strength 3-5, mild reduced sensation on the left, facial asymmetry, right-sided strength intact sensation intact, right facial droop Extremities: No pitting edema lower extremities, +2 pulses  Skin: Dry, warm to touch, negative for any Rashes, No open wounds    Data Reviewed: I have personally reviewed following labs and imaging studies  CBC: Recent Labs  Lab 07/28/19 0544  07/30/19 0544 07/31/19 0337 08/01/19 0835 08/02/19 0745 08/03/19 1036  WBC 8.9   < > 10.2 9.9 8.6 7.3 6.4  NEUTROABS 5.9  --   --   --   --   --   --   HGB 11.0*   < > 13.3 11.9* 10.8* 11.0* 10.4*  HCT 35.9*   < > 40.4 36.0 34.3* 34.6* 33.1*  MCV 91.1   < > 86.0 85.9 89.6 88.7 89.2  PLT 234   < > 297 288 273 282 277   < > = values in this interval not displayed.   Basic Metabolic Panel: Recent Labs  Lab 07/29/19 0527 07/30/19 0544 07/31/19 0337 07/31/19 0929 08/01/19 0558 08/02/19 0745 08/03/19 1036  NA 143 144 141  --  141 141 140  K 3.5 3.3* 3.1*  --  5.7* 4.1 3.9  CL 116* 106 108  --  106 106 103  CO2 22 23 25   --  26 29 29   GLUCOSE 96 92 120*  --  103* 100* 177*  BUN 18 12 10   --  15 13 17   CREATININE 0.91 1.04* 0.90  --   1.09* 1.00 1.04*  CALCIUM 8.1* 8.8* 8.3*  --  8.2* 8.7* 8.6*  MG 1.4* 1.3*  --  1.2*  --   --   --    GFR: Estimated Creatinine Clearance: 34.5 mL/min (A) (by C-G formula based on SCr of 1.04 mg/dL (H)). Liver Function Tests: Recent Labs  Lab 07/29/19 0527 07/30/19 0544 07/31/19 0337 08/01/19 0558 08/02/19 0745  AST 35 46* 33 50* 36  ALT 42 51* 40 32 35  ALKPHOS 53 74 62 54 59  BILITOT 0.4 1.0 0.5 1.1 0.4  PROT 4.4* 5.1* 4.6* 3.7* 4.5*  ALBUMIN 2.2* 2.6* 2.4* 2.1* 2.4*   No results for input(s): LIPASE, AMYLASE in the last 168 hours. No results for input(s): AMMONIA in the last 168 hours. Coagulation Profile: No results for input(s): INR, PROTIME in the last 168 hours. Cardiac Enzymes: Recent Labs  Lab 07/27/19 1343 07/27/19 2019 07/28/19 0544 07/29/19 0527 07/31/19 0929  CKTOTAL 1,367* 832* 547* 268* 88  CKMB  --   --   --   --  1.7   Recent Results (from the past 240 hour(s))  SARS Coronavirus 2 (CEPHEID - Performed in [place] hospital lab), [facility] Order     Status: None   Collection  Time: 07/26/19  3:02 AM   Specimen: Nasopharyngeal Swab  Result Value Ref Range Status   SARS Coronavirus 2 NEGATIVE NEGATIVE Final    Comment: (NOTE) If result is NEGATIVE SARS-CoV-2 target nucleic acids are NOT DETECTED. The SARS-CoV-2 RNA is generally detectable in upper and lower  respiratory specimens during the acute phase of infection. The lowest  concentration of SARS-CoV-2 viral copies this assay can detect is 250  copies / mL. A negative result does not preclude SARS-CoV-2 infection  and should not be used as the sole basis for treatment or other  patient management decisions.  A negative result may occur with  improper specimen collection / handling, submission of specimen other  than nasopharyngeal swab, presence of viral mutation(s) within the  areas targeted by this assay, and inadequate number of viral copies  (<250 copies / mL). A negative result must be combined  with clinical  observations, patient history, and epidemiological information. If result is POSITIVE SARS-CoV-2 target nucleic acids are DETECTED. The SARS-CoV-2 RNA is generally detectable in upper and lower  respiratory specimens dur ing the acute phase of infection.  Positive  results are indicative of active infection with SARS-CoV-2.  Clinical  correlation with patient history and other diagnostic information is  necessary to determine patient infection status.  Positive results do  not rule out bacterial infection or co-infection with other viruses. If result is PRESUMPTIVE POSTIVE SARS-CoV-2 nucleic acids MAY BE PRESENT.   A presumptive positive result was obtained on the submitted specimen  and confirmed on repeat testing.  While 2019 novel coronavirus  (SARS-CoV-2) nucleic acids may be present in the submitted sample  additional confirmatory testing may be necessary for epidemiological  and / or clinical management purposes  to differentiate between  SARS-CoV-2 and other Sarbecovirus currently known to infect humans.  If clinically indicated additional testing with an alternate test  methodology [phone]) is advised. The SARS-CoV-2 RNA is generally  detectable in upper and lower respiratory sp ecimens during the acute  phase of infection. The expected result is Negative. Fact Sheet for Patients:  StrictlyIdeas.no Fact Sheet for Healthcare Providers: BankingDealers.co.za This test is not yet approved or cleared by the [place] FDA and has been authorized for detection and/or diagnosis of SARS-CoV-2 by FDA under an Emergency Use Authorization (EUA).  This EUA will remain in effect (meaning this test can be used) for the duration of the COVID-19 declaration under Section 564(b)(1) of the Act, 21 U.S.C. section 360bbb-3(b)(1), unless the authorization is terminated or revoked sooner. Performed at [facility], [place] [address], [place], [place] [address]   Culture, Urine     Status: Abnormal   Collection Time: 08/02/19  8:00 AM   Specimen: Urine, Clean Catch  Result Value Ref Range Status   Specimen Description URINE, CLEAN CATCH  Final   Special Requests NONE  Final   Culture (A)  Final    <10,000 COLONIES/mL INSIGNIFICANT GROWTH Performed at [facility], [place] [address], [place], [place] [address]    Report Status 08/03/2019 FINAL  Final         Radiology Studies: No results found. Scheduled Meds: . aspirin EC  81 mg Oral Daily  . atorvastatin  10 mg Oral q1800  . enoxaparin (LOVENOX) injection  40 mg Subcutaneous Q24H  . escitalopram  20 mg Oral Daily   Continuous Infusions: . ciprofloxacin 400 mg (08/03/19 0826)  . magnesium sulfate bolus IVPB  LOS: 8 days   Time spent: over 30 min   Deatra James, MD [facility] Pager [email or web address]  If 7PM-7AM, please contact night-coverage [email or web address] Password [email or web address] 08/03/2019, 1:20 PM

## 2019-08-03 NOTE — Care Management Important Message (Signed)
Important Message  Patient Details  Name: Angelica Bell MRN: [id number] Date of Birth: 1932/09/15   Medicare Important Message Given:  Yes     [name] Montine Circle 08/03/2019, 12:38 PM

## 2019-08-04 LAB — CBC
HCT: 33.8 % — ABNORMAL LOW (ref 36.0–46.0)
Hemoglobin: 10.8 g/dL — ABNORMAL LOW (ref 12.0–15.0)
MCH: 28.2 pg (ref 26.0–34.0)
MCHC: 32 g/dL (ref 30.0–36.0)
MCV: 88.3 fL (ref 80.0–100.0)
Platelets: 274 10*3/uL (ref 150–400)
RBC: 3.83 MIL/uL — ABNORMAL LOW (ref 3.87–5.11)
RDW: 14.4 % (ref 11.5–15.5)
WBC: 6.8 10*3/uL (ref 4.0–10.5)
nRBC: 0 % (ref 0.0–0.2)

## 2019-08-04 LAB — BASIC METABOLIC PANEL
Anion gap: 11 (ref 5–15)
BUN: 15 mg/dL (ref 8–23)
CO2: 27 mmol/L (ref 22–32)
Calcium: 8.8 mg/dL — ABNORMAL LOW (ref 8.9–10.3)
Chloride: 104 mmol/L (ref 98–111)
Creatinine, Ser: 1.1 mg/dL — ABNORMAL HIGH (ref 0.44–1.00)
GFR calc Af Amer: 53 mL/min — ABNORMAL LOW (ref >60)
GFR calc non Af Amer: 46 mL/min — ABNORMAL LOW (ref >60)
Glucose, Bld: 114 mg/dL — ABNORMAL HIGH (ref 70–99)
Potassium: 3.7 mmol/L (ref 3.5–5.1)
Sodium: 142 mmol/L (ref 135–145)

## 2019-08-04 MED ORDER — MUSCLE RUB 10-15 % EX CREA
TOPICAL_CREAM | CUTANEOUS | Status: DC | PRN
  Administered 2019-08-04: 05:00:00 via TOPICAL
  Filled 2019-08-04: qty 85

## 2019-08-04 NOTE — Progress Notes (Signed)
PROGRESS NOTE    Angelica Bell  PYP:[id number] DOB: 1932-09-02 DOA: 07/25/2019 PCP: Heywood Bene, PA-C   Brief Narrative: Angelica Bell is a 85 y.o. female,whypertension, ? Rheumatoid arthritis (found in epic review), osteoporosis, scoliosis, chronic back pain, h/o PE, h/o cellulitis   Assessment & Plan:   Principal Problem:   ARF (acute renal failure) (HCC) Active Problems:   Hypokalemia   Essential hypertension   Hyponatremia   Abnormal liver function   Cerebrovascular accident (CVA) ([place])   Acute cystitis   Confusion and disorientation  Acute stroke Resultant left upper extremity deficits. Seen by stroke team. Transthoracic Echocardiogram without embolic source. LDL 58, hemoglobin A1C of 5.9%. Carotid dopplers significant for bilateral 1-39% stenosis and antegrade flow. On Aspirin, Lipitor,  Dysuria Concern for UTI. Started on Cipro. Urine culture with insignificant growth. Discontinued antibiotics  Acute renal failure Peak of 2.76, now resolved. In setting of rhabdomyolysis.  Acute rhabdomyolysis Peak CK of 2500. Now resolved.  Hypokalemia Repleted. -Continue to replete  Elevated LFTs Likely secondary to rhabdomyolysis.  Essential hypertension Mostly controlled.  Chronic back pain -Continue oxycodone   DVT prophylaxis: Lovenox Code Status:   Code Status: Full Code Family Communication: None Disposition Plan: Discharge to SNF when bed available   Consultants:   Neurology  Procedures:   Transthoracic Echocardiogram (07/28/2019) IMPRESSIONS    1. The left ventricle has normal systolic function with an ejection fraction of 60-65%. The cavity size was normal. Left ventricular diastolic function could not be evaluated due to indeterminate diastolic function. Elevated left ventricular  end-diastolic pressure  2. Inadequate images for regional wall motion assessment. Study ended ealy due to patient request.  3. The right ventricle has  normal systolic function. The cavity was normal. There is no increase in right ventricular wall thickness. Right ventricular systolic pressure could not be assessed.  4. The mitral valve is degenerative. Mild calcification of the anterior mitral valve leaflet. There is moderate mitral annular calcification present and trivial mitral regurgitation.  5. The tricuspid valve is grossly normal.  6. The aorta is normal in size and structure.  7. The aortic valve was not well visualized. Aortic valve regurgitation was not assessed by color flow Doppler.  FINDINGS  Left Ventricle: The left ventricle has normal systolic function, with an ejection fraction of 60-65%. The cavity size was normal. There is no increase in left ventricular wall thickness. Left ventricular diastolic function could not be evaluated due to  indeterminate diastolic function. Elevated left ventricular end-diastolic pressure No evidence of left ventricular regional wall motion abnormalities.. Inadequate images for regional wall motion assessment. Study ended ealy due to patient request.  Right Ventricle: The right ventricle has normal systolic function. The cavity was normal. There is no increase in right ventricular wall thickness. Right ventricular systolic pressure could not be assessed.  Left Atrium: Left atrial size was normal in size.  Right Atrium: Right atrial size was normal in size.  Interatrial Septum: No atrial level shunt detected by color flow Doppler.  Pericardium: There is no evidence of pericardial effusion.  Mitral Valve: The mitral valve is degenerative in appearance. Mild calcification of the anterior mitral valve leaflet. There is moderate mitral annular calcification present. Mitral valve regurgitation is trivial by color flow Doppler.  Tricuspid Valve: The tricuspid valve is grossly normal. Tricuspid valve regurgitation is trivial by color flow Doppler.  Aortic Valve: The aortic valve was not well  visualized Aortic valve regurgitation was not assessed by color flow Doppler.  Pulmonic Valve: The pulmonic valve was not well visualized. Pulmonic valve regurgitation is not visualized by color flow Doppler.  Aorta: The aorta is normal in size and structure.  Venous: The inferior vena cava was not well visualized.  Antimicrobials:  Ciprofloxacin    Subjective: No issues overnight. Continued left arm weakness  Objective: Vitals:   08/04/19 0512 08/04/19 0740 08/04/19 1100 08/04/19 1645  BP:  (!) 166/76 (!) 156/80 134/88  Pulse:  80 88   Resp:  18 18 16   Temp:  98.4 F (36.9 C) 98.4 F (36.9 C) 98.3 F (36.8 C)  TempSrc:  Oral Oral Oral  SpO2:  100%  100%  Weight: 55.3 kg     Height:        Intake/Output Summary (Last 24 hours) at 08/04/2019 1854 Last data filed at 08/03/2019 2300 Gross per 24 hour  Intake 60 ml  Output -  Net 60 ml   Filed Weights   08/03/19 0500 08/04/19 0506 08/04/19 0512  Weight: 55.3 kg 54.9 kg 55.3 kg    Examination:  General exam: Appears calm and comfortable Respiratory system: Clear to auscultation. Respiratory effort normal. Cardiovascular system: S1 & S2 heard, RRR. No murmurs, rubs, gallops or clicks. Gastrointestinal system: Abdomen is nondistended, soft and nontender. No organomegaly or masses felt. Normal bowel sounds heard. Central nervous system: Alert and oriented. 4/5 LUE compared to 5/5 RUE strength. Extremities: No edema. No calf tenderness Skin: No cyanosis. No rashes Psychiatry: Judgement and insight appear normal. Mood & affect appropriate.     Data Reviewed: I have personally reviewed following labs and imaging studies  CBC: Recent Labs  Lab 07/31/19 0337 08/01/19 0835 08/02/19 0745 08/03/19 1036 08/04/19 0508  WBC 9.9 8.6 7.3 6.4 6.8  HGB 11.9* 10.8* 11.0* 10.4* 10.8*  HCT 36.0 34.3* 34.6* 33.1* 33.8*  MCV 85.9 89.6 88.7 89.2 88.3  PLT 288 273 282 277 774   Basic Metabolic Panel: Recent Labs  Lab  07/29/19 0527 07/30/19 0544 07/31/19 0337 07/31/19 0929 08/01/19 0558 08/02/19 0745 08/03/19 1036 08/04/19 0508  NA 143 144 141  --  141 141 140 142  K 3.5 3.3* 3.1*  --  5.7* 4.1 3.9 3.7  CL 116* 106 108  --  106 106 103 104  CO2 22 23 25   --  26 29 29 27   GLUCOSE 96 92 120*  --  103* 100* 177* 114*  BUN 18 12 10   --  15 13 17 15   CREATININE 0.91 1.04* 0.90  --  1.09* 1.00 1.04* 1.10*  CALCIUM 8.1* 8.8* 8.3*  --  8.2* 8.7* 8.6* 8.8*  MG 1.4* 1.3*  --  1.2*  --   --   --   --    GFR: Estimated Creatinine Clearance: 32.6 mL/min (A) (by C-G formula based on SCr of 1.1 mg/dL (H)). Liver Function Tests: Recent Labs  Lab 07/29/19 0527 07/30/19 0544 07/31/19 0337 08/01/19 0558 08/02/19 0745  AST 35 46* 33 50* 36  ALT 42 51* 40 32 35  ALKPHOS 53 74 62 54 59  BILITOT 0.4 1.0 0.5 1.1 0.4  PROT 4.4* 5.1* 4.6* 3.7* 4.5*  ALBUMIN 2.2* 2.6* 2.4* 2.1* 2.4*   No results for input(s): LIPASE, AMYLASE in the last 168 hours. No results for input(s): AMMONIA in the last 168 hours. Coagulation Profile: No results for input(s): INR, PROTIME in the last 168 hours. Cardiac Enzymes: Recent Labs  Lab 07/29/19 0527 07/31/19 0929  CKTOTAL 268* 88  CKMB  --  1.7   BNP (last 3 results) No results for input(s): PROBNP in the last 8760 hours. HbA1C: No results for input(s): HGBA1C in the last 72 hours. CBG: No results for input(s): GLUCAP in the last 168 hours. Lipid Profile: No results for input(s): CHOL, HDL, LDLCALC, TRIG, CHOLHDL, LDLDIRECT in the last 72 hours. Thyroid Function Tests: No results for input(s): TSH, T4TOTAL, FREET4, T3FREE, THYROIDAB in the last 72 hours. Anemia Panel: No results for input(s): VITAMINB12, FOLATE, FERRITIN, TIBC, IRON, RETICCTPCT in the last 72 hours. Sepsis Labs: No results for input(s): PROCALCITON, LATICACIDVEN in the last 168 hours.  Recent Results (from the past 240 hour(s))  SARS Coronavirus 2 (CEPHEID - Performed in [place] hospital  lab), Hosp Order     Status: None   Collection Time: 07/26/19  3:02 AM   Specimen: Nasopharyngeal Swab  Result Value Ref Range Status   SARS Coronavirus 2 NEGATIVE NEGATIVE Final    Comment: (NOTE) If result is NEGATIVE SARS-CoV-2 target nucleic acids are NOT DETECTED. The SARS-CoV-2 RNA is generally detectable in upper and lower  respiratory specimens during the acute phase of infection. The lowest  concentration of SARS-CoV-2 viral copies this assay can detect is 250  copies / mL. A negative result does not preclude SARS-CoV-2 infection  and should not be used as the sole basis for treatment or other  patient management decisions.  A negative result may occur with  improper specimen collection / handling, submission of specimen other  than nasopharyngeal swab, presence of viral mutation(s) within the  areas targeted by this assay, and inadequate number of viral copies  (<250 copies / mL). A negative result must be combined with clinical  observations, patient history, and epidemiological information. If result is POSITIVE SARS-CoV-2 target nucleic acids are DETECTED. The SARS-CoV-2 RNA is generally detectable in upper and lower  respiratory specimens dur ing the acute phase of infection.  Positive  results are indicative of active infection with SARS-CoV-2.  Clinical  correlation with patient history and other diagnostic information is  necessary to determine patient infection status.  Positive results do  not rule out bacterial infection or co-infection with other viruses. If result is PRESUMPTIVE POSTIVE SARS-CoV-2 nucleic acids MAY BE PRESENT.   A presumptive positive result was obtained on the submitted specimen  and confirmed on repeat testing.  While 2019 novel coronavirus  (SARS-CoV-2) nucleic acids may be present in the submitted sample  additional confirmatory testing may be necessary for epidemiological  and / or clinical management purposes  to differentiate between   SARS-CoV-2 and other Sarbecovirus currently known to infect humans.  If clinically indicated additional testing with an alternate test  methodology [phone]) is advised. The SARS-CoV-2 RNA is generally  detectable in upper and lower respiratory sp ecimens during the acute  phase of infection. The expected result is Negative. Fact Sheet for Patients:  StrictlyIdeas.no Fact Sheet for Healthcare Providers: BankingDealers.co.za This test is not yet approved or cleared by the [place] FDA and has been authorized for detection and/or diagnosis of SARS-CoV-2 by FDA under an Emergency Use Authorization (EUA).  This EUA will remain in effect (meaning this test can be used) for the duration of the COVID-19 declaration under Section 564(b)(1) of the Act, 21 U.S.C. section 360bbb-3(b)(1), unless the authorization is terminated or revoked sooner. Performed at [facility], [place] [address] [place], [place] [address]   Culture, Urine     Status: Abnormal   Collection Time: 08/02/19  8:00  AM   Specimen: Urine, Clean Catch  Result Value Ref Range Status   Specimen Description URINE, CLEAN CATCH  Final   Special Requests NONE  Final   Culture (A)  Final    <10,000 COLONIES/mL INSIGNIFICANT GROWTH Performed at [facility], [address] [place], [place] [address]    Report Status 08/03/2019 FINAL  Final         Radiology Studies: No results found.      Scheduled Meds: . aspirin EC  81 mg Oral Daily  . atorvastatin  10 mg Oral q1800  . enoxaparin (LOVENOX) injection  40 mg Subcutaneous Q24H  . escitalopram  20 mg Oral Daily   Continuous Infusions: . magnesium sulfate bolus IVPB       LOS: 9 days     Cordelia Poche, MD [facility] 08/04/2019, 6:54 PM  If 7PM-7AM, please contact night-coverage [email or web address]

## 2019-08-04 NOTE — Progress Notes (Signed)
Page to MD on call to notify that patient has pulled IV out and is refusing to allow RN to restart. MD informed that pt had Cipro infusing when IV was pulled. Pt says she will be leaving tomorrow and does not want IV restarted. Pt alert to time, place, and situation.

## 2019-08-04 NOTE — Progress Notes (Signed)
Physical Therapy Treatment Patient Details Name: Angelica Bell MRN: [id number] DOB: [birth year] Today's Date: 08/04/2019    History of Present Illness Pt is an 85 y/o female admitted after falling in the bathroom, recurrent falls, and FTT. Xrays negative for fracture.  CT of the head for AMS demonstrated chronic ischemic changes only. However, her MRI brain revealed scattered acute small infarctions primarily within the white matter of the cerebral hemispheres but with some cortical involvement most notably in the left occipital region.  This is concerning for possible embolic infarctions. PMH includes:  RA, HTN, osteoporosis, scoliosis, chronic back pain, h/o PE, h/o cellulitis, s/p Rt THA     PT Comments    Pt was seen for mult practices with transfers but also to try to fit the lumbar corset brace.  Her fit in the brace was quite close and with the straps pulled was too confined for pt.  The overlap is minimal and does not fit her properly.  Pt was told that perhaps the fit can be adjusted before just replacing it.  Notified both RN and MD about the issue, and pt was assisted to bed as she is tired and hungry, having not eaten lunch and asking for food.  Follow acutely as planned.   Follow Up Recommendations  SNF     Equipment Recommendations  None recommended by PT    Recommendations for Other Services       Precautions / Restrictions Precautions Precautions: Fall Precaution Comments: pt is refused rehab by insurance Required Braces or Orthoses: Spinal Brace Spinal Brace: Applied in sitting position;Applied in standing position;Lumbar corset(pt cannot wear well as it is too short) Restrictions Weight Bearing Restrictions: No    Mobility  Bed Mobility Overal bed mobility: Needs Assistance Bed Mobility: Supine to Sit;Sit to Supine     Supine to sit: Min assist Sit to supine: Min guard   General bed mobility comments: pt requires min assist up to support trunk and min guard  for back precautions back to bed  Transfers Overall transfer level: Needs assistance Equipment used: Rolling walker (2 wheeled) Transfers: Sit to/from Stand Sit to Stand: Mod assist         General transfer comment: mod assist and dense cues for postural correction  Ambulation/Gait             General Gait Details: declined as she was very hungry   Chief Strategy Officer    Modified Rankin (Stroke Patients Only) Modified Rankin (Stroke Patients Only) Pre-Morbid Rankin Score: No symptoms Modified Rankin: Moderately severe disability     Balance Overall balance assessment: Needs assistance Sitting-balance support: Feet supported Sitting balance-Leahy Scale: Fair   Postural control: Posterior lean;Left lateral lean Standing balance support: Bilateral upper extremity supported;During functional activity Standing balance-Leahy Scale: Poor                              Cognition Arousal/Alertness: Awake/alert Behavior During Therapy: WFL for tasks assessed/performed Overall Cognitive Status: Impaired/Different from baseline Area of Impairment: Safety/judgement;Awareness;Problem solving                 Orientation Level: Situation Current Attention Level: Sustained Memory: Decreased short-term memory Following Commands: Follows one step commands inconsistently;Follows one step commands with increased time Safety/Judgement: Decreased awareness of safety;Decreased awareness of deficits Awareness: Intellectual Problem Solving: Difficulty sequencing  Exercises      General Comments General comments (skin integrity, edema, etc.): pt was not comfortable in back brace, and could not apply in sitting but then in standing still would not fully close      Pertinent Vitals/Pain Pain Assessment: Faces Faces Pain Scale: Hurts even more Pain Location: back Pain Descriptors / Indicators: Grimacing Pain Intervention(s):  Limited activity within patient's tolerance;Premedicated before session;Monitored during session;Repositioned    Home Living                      Prior Function            PT Goals (current goals can now be found in the care plan section) Acute Rehab PT Goals Patient Stated Goal: to get to rehab Progress towards PT goals: Progressing toward goals    Frequency    Min 3X/week      PT Plan Current plan remains appropriate    Co-evaluation              AM-PAC PT "6 Clicks" Mobility   Outcome Measure  Help needed turning from your back to your side while in a flat bed without using bedrails?: A Little Help needed moving from lying on your back to sitting on the side of a flat bed without using bedrails?: A Little Help needed moving to and from a bed to a chair (including a wheelchair)?: A Little Help needed standing up from a chair using your arms (e.g., wheelchair or bedside chair)?: A Lot Help needed to walk in hospital room?: A Lot Help needed climbing 3-5 steps with a railing? : Total 6 Click Score: 14    End of Session Equipment Utilized During Treatment: Gait belt Activity Tolerance: Patient limited by pain;Patient limited by fatigue;Other (comment)(hungry and had not eaten lunch) Patient left: in bed;with call bell/phone within reach;with bed alarm set Nurse Communication: Mobility status;Other (comment)(concerns messaged to doctor and nurse about back brace) PT Visit Diagnosis: Other abnormalities of gait and mobility (R26.89);Other symptoms and signs involving the nervous system (R29.898)     Time: 1536-1610 PT Time Calculation (min) (ACUTE ONLY): 34 min  Charges:  $Therapeutic Activity: 8-22 mins $Orthotics/Prosthetics Check: 8-22 mins          Ramond Dial 08/04/2019, 4:23 PM   Mee Hives, PT MS Acute Rehab Dept. Number: [place] and [place]

## 2019-08-04 NOTE — Progress Notes (Signed)
CSW called the patient's grandson and explained that the patient's insurance has been denied. CSW explained that they were in the process of filing an appeal. CSW told him that they would keep him up to date with the appeal process.   CSW will continue to follow.   Domenic Schwab, MSW, [place] Worker [facility]  [phone]

## 2019-08-05 LAB — CBC
HCT: 35.2 % — ABNORMAL LOW (ref 36.0–46.0)
Hemoglobin: 11.6 g/dL — ABNORMAL LOW (ref 12.0–15.0)
MCH: 28.6 pg (ref 26.0–34.0)
MCHC: 33 g/dL (ref 30.0–36.0)
MCV: 86.9 fL (ref 80.0–100.0)
Platelets: 327 10*3/uL (ref 150–400)
RBC: 4.05 MIL/uL (ref 3.87–5.11)
RDW: 14.5 % (ref 11.5–15.5)
WBC: 7.8 10*3/uL (ref 4.0–10.5)
nRBC: 0 % (ref 0.0–0.2)

## 2019-08-05 LAB — BASIC METABOLIC PANEL
Anion gap: 11 (ref 5–15)
BUN: 12 mg/dL (ref 8–23)
CO2: 28 mmol/L (ref 22–32)
Calcium: 8.8 mg/dL — ABNORMAL LOW (ref 8.9–10.3)
Chloride: 101 mmol/L (ref 98–111)
Creatinine, Ser: 0.77 mg/dL (ref 0.44–1.00)
GFR calc Af Amer: >60 mL/min (ref >60)
GFR calc non Af Amer: >60 mL/min (ref >60)
Glucose, Bld: 112 mg/dL — ABNORMAL HIGH (ref 70–99)
Potassium: 3.3 mmol/L — ABNORMAL LOW (ref 3.5–5.1)
Sodium: 140 mmol/L (ref 135–145)

## 2019-08-05 MED ORDER — POTASSIUM CHLORIDE CRYS ER 20 MEQ PO TBCR
40.0000 meq | EXTENDED_RELEASE_TABLET | Freq: Once | ORAL | Status: AC
  Administered 2019-08-05: 18:00:00 40 meq via ORAL
  Filled 2019-08-05: qty 2

## 2019-08-05 NOTE — Plan of Care (Signed)
  Problem: Education: Goal: Knowledge of General Education information will improve Description Including pain rating scale, medication(s)/side effects and non-pharmacologic comfort measures Outcome: Progressing   

## 2019-08-05 NOTE — Progress Notes (Signed)
Occupational Therapy Treatment Patient Details Name: Angelica Bell MRN: [id number] DOB: November 23, 1932 Today's Date: 08/05/2019    History of present illness Pt is an 85 y/o female admitted after falling in the bathroom, recurrent falls, and FTT. Xrays negative for fracture.  CT of the head for AMS demonstrated chronic ischemic changes only. However, her MRI brain revealed scattered acute small infarctions primarily within the white matter of the cerebral hemispheres but with some cortical involvement most notably in the left occipital region.  This is concerning for possible embolic infarctions. PMH includes:  RA, HTN, osteoporosis, scoliosis, chronic back pain, h/o PE, h/o cellulitis, s/p Rt THA    OT comments  Pt performing transfer to 3in1 for toilet hygiene with minA overall. Pt performing transfer with minA and ambulating in room with RW and minA overall with cues for safety. Pt denied need for wearing brace today. Pt would greatly benefit from continued OT skilled services for ADL, mobility and safety. Pt unable to care for self at home alone and would greatly benefit from SNF level assist. OT to follow acutely.  Refusal earlier today- pt usually motivated by hygiene.    Follow Up Recommendations  SNF;Supervision/Assistance - 24 hour    Equipment Recommendations       Recommendations for Other Services      Precautions / Restrictions Precautions Precautions: Fall Required Braces or Orthoses: Spinal Brace Spinal Brace: Other (comment) Spinal Brace Comments: would not wear Restrictions Weight Bearing Restrictions: No       Mobility Bed Mobility Overal bed mobility: Needs Assistance Bed Mobility: Supine to Sit;Sit to Supine     Supine to sit: Min assist Sit to supine: Min guard   General bed mobility comments: pt requires min assist up to support trunk and min guard for back precautions back to bed  Transfers Overall transfer level: Needs assistance Equipment used:  Rolling walker (2 wheeled) Transfers: Sit to/from Stand Sit to Stand: Mod assist         General transfer comment: mod assist and dense cues for postural correction    Balance Overall balance assessment: Needs assistance Sitting-balance support: Feet supported Sitting balance-Leahy Scale: Fair   Postural control: Posterior lean;Left lateral lean Standing balance support: Bilateral upper extremity supported;During functional activity Standing balance-Leahy Scale: Poor                             ADL either performed or assessed with clinical judgement   ADL Overall ADL's : Needs assistance/impaired Eating/Feeding: Supervision/ safety;Sitting   Grooming: Wash/dry hands;Wash/dry face;Min guard;Standing Grooming Details (indicate cue type and reason): pt refusing to comb hair                             Functional mobility during ADLs: Minimal assistance;Rolling walker General ADL Comments: Pt requiring assist to go to [facility] and for toilet hygiene. pt ambulating with RW with minA overall.     Vision   Vision Assessment?: No apparent visual deficits   Perception     Praxis      Cognition Arousal/Alertness: Awake/alert Behavior During Therapy: WFL for tasks assessed/performed Overall Cognitive Status: Impaired/Different from baseline Area of Impairment: Safety/judgement;Awareness;Problem solving                     Memory: Decreased short-term memory Following Commands: Follows one step commands with increased time Safety/Judgement: Decreased awareness of safety;Decreased awareness  of deficits Awareness: Intellectual Problem Solving: Difficulty sequencing General Comments: Pt appears to not be aware of the deficits        Exercises     Shoulder Instructions       General Comments Pt refused to wear brace today stating "I don't like it."    Pertinent Vitals/ Pain       Pain Assessment: Faces Faces Pain Scale: Hurts even  more Pain Location: back Pain Descriptors / Indicators: Grimacing Pain Intervention(s): Limited activity within patient's tolerance  Home Living                                          Prior Functioning/Environment              Frequency  Min 2X/week        Progress Toward Goals  OT Goals(current goals can now be found in the care plan section)  Progress towards OT goals: Progressing toward goals  Acute Rehab OT Goals Patient Stated Goal: to get to rehab OT Goal Formulation: With patient Time For Goal Achievement: 08/24/19 Potential to Achieve Goals: Good ADL Goals Pt Will Perform Grooming: with min assist;standing Pt Will Perform Upper Body Bathing: with min assist;sitting Pt Will Perform Lower Body Bathing: with mod assist;sit to/from stand Pt Will Perform Upper Body Dressing: with mod assist;sitting Pt Will Perform Lower Body Dressing: with mod assist;sit to/from stand Pt Will Transfer to Toilet: with min assist;stand pivot transfer;bedside commode Pt Will Perform Toileting - Clothing Manipulation and hygiene: with mod assist;sit to/from stand Additional ADL Goal #1: Pt will sustain attention to simple ADL task x 5 mins with min cues Additional ADL Goal #2: Pt will be oriented x 4 with external cues  Plan Discharge plan remains appropriate    Co-evaluation                 AM-PAC OT "6 Clicks" Daily Activity     Outcome Measure   Help from another person eating meals?: A Little Help from another person taking care of personal grooming?: A Lot Help from another person toileting, which includes using toliet, bedpan, or urinal?: A Lot Help from another person bathing (including washing, rinsing, drying)?: A Lot Help from another person to put on and taking off regular upper body clothing?: A Lot Help from another person to put on and taking off regular lower body clothing?: A Lot 6 Click Score: 13    End of Session Equipment Utilized  During Treatment: Gait belt;Rolling walker  OT Visit Diagnosis: Unsteadiness on feet (R26.81);Other abnormalities of gait and mobility (R26.89);Pain;Hemiplegia and hemiparesis Symptoms and signs involving cognitive functions: Cerebral infarction Hemiplegia - Right/Left: Left Hemiplegia - dominant/non-dominant: Non-Dominant Hemiplegia - caused by: Cerebral infarction   Activity Tolerance Patient limited by pain   Patient Left in chair;with call bell/phone within reach;with chair alarm set   Nurse Communication Mobility status;Patient requests pain meds        Time: 1531-1550 OT Time Calculation (min): 19 min  Charges: OT General Charges $OT Visit: 1 Visit OT Treatments $Self Care/Home Management : 8-22 mins  Darryl Nestle) Marsa Aris OTR/L Acute Rehabilitation Services Pager: [phone] Office: [place] 08/05/2019, 4:19 PM

## 2019-08-05 NOTE — TOC Progression Note (Signed)
Transition of Care [facility]) - Progression Note    Patient Details  Name: Angelica Bell MRN: [id number] Date of Birth: 1932/12/04  Transition of Care [facility]) CM/SW [place], [place] Phone Number: 08/05/2019, 10:46 AM  Clinical Narrative:  CSW following to work towards appealing SNF denial. CSW received fax from [employer], and attempted to complete the documents. CSW received update from RN that the patient was not being cooperative and was refusing parts of care; CSW staffed case with new MD to determine if patient continued to remain appropriate for SNF. MD assessed and reported that patient remains appropriate for SNF, wants to continue to follow through with completing appeal. MD asked about ability to complete the peer to peer review again, and the insurance will not allow another peer to peer after the request has been denied.  CSW unsure about filling out appeal request form, and there is no number on the forms to contact. CSW reached out to CIR admissions to staff if they have completed [facility] appeals before and to ask for assistance. CSW given phone number for [facility] case manager Larene [place] that they contact for assistance with CIR appeals. CSW reached out to [facility] to discuss the situation and ask what to do. Per Larene [place], the appeal request has to go through [place], and [place] gave CSW a number to call.   CSW contacted [employer] to provide the information on the situation and request an appeal for the denial for SNF. Spoke with Theadora Rama to provide information, and Theadora Rama will send a fax requesting medical records.      Expected Discharge Plan: Skilled Nursing Facility Barriers to Discharge: Ship broker, Continued Medical Work up  Expected Discharge Plan and Services Expected Discharge Plan: [place] In-house Referral: Clinical Social Work   Post Acute Care Choice: [place] Living arrangements  for the past 2 months: Single Family Home                 DME Arranged: N/A DME Agency: NA       HH Arranged: NA HH Agency: NA         Social Determinants of Health (SDOH) Interventions    Readmission Risk Interventions No flowsheet data found.

## 2019-08-05 NOTE — TOC Progression Note (Addendum)
Transition of Care [facility]) - Progression Note    Patient Details  Name: AERIKA GROLL MRN: [id number] Date of Birth: 01/04/1932  Transition of Care [facility]) CM/SW [place], [place] Phone Number: 08/05/2019, 10:18 AM  Clinical Narrative:    4:28pm- Received call from [facility] with [place], she is submitting fast appeal meaning a determination can be made within the next 72 hours. [employer]; pt preferred SNF is aware.   3:51pm- Spoke again with Anderson Malta at [place] [facility] [place]; she is aware of difficulty contacting appeals. Appeals has directed this Probation officer to send updated clinicals to [phone]. Updated clinicals sent.  12:57pm- CSW called [phone]; message left for specialist to return call regarding appeal of SNF denial.  11:50am- CSW contacted [facility] and requested either a number for an appeal or assistance with resubmitting a SNF request. Chip, case manager, states that he does not have a number that pt can appeal decision through. He provided [phone] as number to call for appeals.     11:46am- Per MD he attempted to get through to number below; states he cannot get through and does not currently have time to complete. CSW attempting to find a more direct number for MD to call for appeal.  10:54am- CSW received return call form Anderson Malta with [place] [facility] [place]. Per appeals department pt or MD will need to call appeals line on behalf of pt. Will reach out to MD to facilitate this- per card in system number to call would be [phone]. Will reach out to MD to facilitate this appeal.   10:18am- Message left regarding appeal with Anderson Malta from [place] [facility] [place] at [phone].   Expected Discharge Plan: Skilled Nursing Facility Barriers to Discharge: Ship broker, Continued Medical Work up  Expected Discharge Plan and Services Expected Discharge Plan: [place] In-house Referral: Clinical Social Work   Post Acute Care Choice: [place] Living arrangements for the past 2 months: Single Family Home                 DME Arranged: N/A DME Agency: NA       HH Arranged: NA HH Agency: NA         Social Determinants of Health (SDOH) Interventions    Readmission Risk Interventions No flowsheet data found.

## 2019-08-05 NOTE — Progress Notes (Signed)
PT Cancellation Note  Patient Details Name: Angelica Bell MRN: [id number] DOB: [birth year]   Cancelled Treatment:    Reason Eval/Treat Not Completed: Patient declined, no reason specified Attempted to see pt for PT treatment. Pt refused to participate in therapy despite MAX encouragement to participate. Pt became agitated with therapists and could not be persuaded to participate. At this time pt has not been observed mobilizing OOB by PT since 8/3 however pt is able to sit up in bed without difficulty and sit on EOB and, at time of visit, appeared to not be in pain.  PT will continue to follow acutely.   Earney Navy, PTA Acute Rehabilitation Services Pager: [phone] Office: [phone]   08/05/2019, 2:10 PM

## 2019-08-05 NOTE — TOC Progression Note (Signed)
LATE NOTE SUBMISSION    Transition of Care [facility]) - Progression Note    Patient Details  Name: Angelica Bell MRN: [id number] Date of Birth: 08/02/1932  Transition of Care [facility]) CM/SW [place], [place] Phone Number: 08/05/2019, 10:39 AM  Clinical Narrative:   CSW following for discharge plan. CSW continuing to work on appealing the [name] denial for SNF placement. CSW contacted number provided by [facility] to initiate appeal request, but after waiting on hold, was told that the number provided was for the [facility] appeals and not [employer]. [employer] representative is unable to assist CSW with filing the appeal, and suggested that CSW contact [facility] again for the correct phone number.  CSW contacted [place] to explain the situation and obtain a number to contact to initiate the appeal. CSW was given two more numbers to try to initiate the appeal. CSW contacted [facility] to request the appeal. After waiting on hold and speaking to a representative, the representative was unable to find where the patient had been denied a SNF authorization, and suggested that [place] initiate a new SNF request.   CSW completed new SNF authorization request and faxed to [facility]. CSW received call back from [facility] that a new authorization cannot be requested as the request had been denied, the decision would need to be appealed. CSW explained the situation, and was provided with yet another phone number to call to request the appeal process.  CSW contacted [employer] to request an appeal to the SNF denial. Number given was for [employer] instead of [employer], representative was unable to assist CSW with completing the appeal process. [employer] representative discussed how their appeal process works, and sent CSW forms to complete to send back that would hopefully work the same for [employer] as it does for commercial, but was not completely sure. [employer] data processor provided phone number to CSW that CSW had already contacted and was unable to assist previously. CSW awaiting fax. CSW to continue to follow.    Expected Discharge Plan: Skilled Nursing Facility Barriers to Discharge: Ship broker, Continued Medical Work up  Expected Discharge Plan and Services Expected Discharge Plan: [place] In-house Referral: Clinical Social Work   Post Acute Care Choice: [place] Living arrangements for the past 2 months: Single Family Home                 DME Arranged: N/A DME Agency: NA       HH Arranged: NA HH Agency: NA         Social Determinants of Health (SDOH) Interventions    Readmission Risk Interventions No flowsheet data found.

## 2019-08-05 NOTE — Progress Notes (Signed)
PROGRESS NOTE    Angelica Bell  BSJ:[id number] DOB: 05/20/1932 DOA: 07/25/2019 PCP: Heywood Bene, PA-C   Brief Narrative: Angelica Bell is a 85 y.o. female,whypertension, ? Rheumatoid arthritis (found in epic review), osteoporosis, scoliosis, chronic back pain, h/o PE, h/o cellulitis   Assessment & Plan:   Principal Problem:   ARF (acute renal failure) (HCC) Active Problems:   Hypokalemia   Essential hypertension   Hyponatremia   Abnormal liver function   Cerebrovascular accident (CVA) ([place])   Acute cystitis   Confusion and disorientation  Acute stroke Resultant left upper extremity deficits. Seen by stroke team. Transthoracic Echocardiogram without embolic source. LDL 58, hemoglobin A1C of 5.9%. Carotid dopplers significant for bilateral 1-39% stenosis and antegrade flow. On Aspirin, Lipitor,  Dysuria Concern for UTI. Started on Cipro. Urine culture with insignificant growth. Discontinued antibiotics  Acute renal failure Peak of 2.76, now resolved. In setting of rhabdomyolysis.  Acute rhabdomyolysis Peak CK of 2500. Now resolved.  Hypokalemia Repleted. -Continue to replete  Elevated LFTs Likely secondary to rhabdomyolysis.  Essential hypertension Mostly controlled.  Chronic back pain -Continue oxycodone   DVT prophylaxis: Lovenox Code Status:   Code Status: Full Code Family Communication: None Disposition Plan: Discharge to SNF when bed available and pending insurance authorization   Consultants:   Neurology  Procedures:   Transthoracic Echocardiogram (07/28/2019) IMPRESSIONS    1. The left ventricle has normal systolic function with an ejection fraction of 60-65%. The cavity size was normal. Left ventricular diastolic function could not be evaluated due to indeterminate diastolic function. Elevated left ventricular  end-diastolic pressure  2. Inadequate images for regional wall motion assessment. Study ended ealy due to patient  request.  3. The right ventricle has normal systolic function. The cavity was normal. There is no increase in right ventricular wall thickness. Right ventricular systolic pressure could not be assessed.  4. The mitral valve is degenerative. Mild calcification of the anterior mitral valve leaflet. There is moderate mitral annular calcification present and trivial mitral regurgitation.  5. The tricuspid valve is grossly normal.  6. The aorta is normal in size and structure.  7. The aortic valve was not well visualized. Aortic valve regurgitation was not assessed by color flow Doppler.  FINDINGS  Left Ventricle: The left ventricle has normal systolic function, with an ejection fraction of 60-65%. The cavity size was normal. There is no increase in left ventricular wall thickness. Left ventricular diastolic function could not be evaluated due to  indeterminate diastolic function. Elevated left ventricular end-diastolic pressure No evidence of left ventricular regional wall motion abnormalities.. Inadequate images for regional wall motion assessment. Study ended ealy due to patient request.  Right Ventricle: The right ventricle has normal systolic function. The cavity was normal. There is no increase in right ventricular wall thickness. Right ventricular systolic pressure could not be assessed.  Left Atrium: Left atrial size was normal in size.  Right Atrium: Right atrial size was normal in size.  Interatrial Septum: No atrial level shunt detected by color flow Doppler.  Pericardium: There is no evidence of pericardial effusion.  Mitral Valve: The mitral valve is degenerative in appearance. Mild calcification of the anterior mitral valve leaflet. There is moderate mitral annular calcification present. Mitral valve regurgitation is trivial by color flow Doppler.  Tricuspid Valve: The tricuspid valve is grossly normal. Tricuspid valve regurgitation is trivial by color flow Doppler.  Aortic  Valve: The aortic valve was not well visualized Aortic valve regurgitation was not assessed  by color flow Doppler.  Pulmonic Valve: The pulmonic valve was not well visualized. Pulmonic valve regurgitation is not visualized by color flow Doppler.  Aorta: The aorta is normal in size and structure.  Venous: The inferior vena cava was not well visualized.  Antimicrobials:  Ciprofloxacin    Subjective: Still with left arm weakness. Sad about son-in-law's funeral today.  Objective: Vitals:   08/04/19 1953 08/04/19 2330 08/05/19 0405 08/05/19 0900  BP: (!) 167/91 (!) 163/78 (!) 168/80 (!) 149/72  Pulse: 73 76 77 80  Resp: 18 18 18 18   Temp: 98.9 F (37.2 C) 98.7 F (37.1 C) 98.5 F (36.9 C) 97.6 F (36.4 C)  TempSrc: Oral Oral Oral Axillary  SpO2: 93% 96% 93% 96%  Weight:   55 kg   Height:        Intake/Output Summary (Last 24 hours) at 08/05/2019 1603 Last data filed at 08/05/2019 0900 Gross per 24 hour  Intake 542 ml  Output 100 ml  Net 442 ml   Filed Weights   08/04/19 0506 08/04/19 0512 08/05/19 0405  Weight: 54.9 kg 55.3 kg 55 kg    Examination:  General exam: Appears calm and comfortable Respiratory system: Clear to auscultation. Respiratory effort normal. Cardiovascular system: S1 & S2 heard, RRR. No murmurs, rubs, gallops or clicks. Gastrointestinal system: Abdomen is nondistended, soft and nontender. No organomegaly or masses felt. Normal bowel sounds heard. Central nervous system: Alert and oriented. Left upper arm weakness. Extremities: No edema. No calf tenderness Skin: No cyanosis. No rashes Psychiatry: Judgement and insight appear normal. Mood & affect appropriate.     Data Reviewed: I have personally reviewed following labs and imaging studies  CBC: Recent Labs  Lab 08/01/19 0835 08/02/19 0745 08/03/19 1036 08/04/19 0508 08/05/19 0424  WBC 8.6 7.3 6.4 6.8 7.8  HGB 10.8* 11.0* 10.4* 10.8* 11.6*  HCT 34.3* 34.6* 33.1* 33.8* 35.2*  MCV 89.6  88.7 89.2 88.3 86.9  PLT 273 282 277 274 638   Basic Metabolic Panel: Recent Labs  Lab 07/30/19 0544  07/31/19 0929 08/01/19 0558 08/02/19 0745 08/03/19 1036 08/04/19 0508 08/05/19 0424  NA 144   < >  --  141 141 140 142 140  K 3.3*   < >  --  5.7* 4.1 3.9 3.7 3.3*  CL 106   < >  --  106 106 103 104 101  CO2 23   < >  --  26 29 29 27 28   GLUCOSE 92   < >  --  103* 100* 177* 114* 112*  BUN 12   < >  --  15 13 17 15 12   CREATININE 1.04*   < >  --  1.09* 1.00 1.04* 1.10* 0.77  CALCIUM 8.8*   < >  --  8.2* 8.7* 8.6* 8.8* 8.8*  MG 1.3*  --  1.2*  --   --   --   --   --    < > = values in this interval not displayed.   GFR: Estimated Creatinine Clearance: 44.6 mL/min (by C-G formula based on SCr of 0.77 mg/dL). Liver Function Tests: Recent Labs  Lab 07/30/19 0544 07/31/19 0337 08/01/19 0558 08/02/19 0745  AST 46* 33 50* 36  ALT 51* 40 32 35  ALKPHOS 74 62 54 59  BILITOT 1.0 0.5 1.1 0.4  PROT 5.1* 4.6* 3.7* 4.5*  ALBUMIN 2.6* 2.4* 2.1* 2.4*   No results for input(s): LIPASE, AMYLASE in the last 168 hours. No results  for input(s): AMMONIA in the last 168 hours. Coagulation Profile: No results for input(s): INR, PROTIME in the last 168 hours. Cardiac Enzymes: Recent Labs  Lab 07/31/19 0929  CKTOTAL 88  CKMB 1.7   BNP (last 3 results) No results for input(s): PROBNP in the last 8760 hours. HbA1C: No results for input(s): HGBA1C in the last 72 hours. CBG: No results for input(s): GLUCAP in the last 168 hours. Lipid Profile: No results for input(s): CHOL, HDL, LDLCALC, TRIG, CHOLHDL, LDLDIRECT in the last 72 hours. Thyroid Function Tests: No results for input(s): TSH, T4TOTAL, FREET4, T3FREE, THYROIDAB in the last 72 hours. Anemia Panel: No results for input(s): VITAMINB12, FOLATE, FERRITIN, TIBC, IRON, RETICCTPCT in the last 72 hours. Sepsis Labs: No results for input(s): PROCALCITON, LATICACIDVEN in the last 168 hours.  Recent Results (from the past 240  hour(s))  Culture, Urine     Status: Abnormal   Collection Time: 08/02/19  8:00 AM   Specimen: Urine, Clean Catch  Result Value Ref Range Status   Specimen Description URINE, CLEAN CATCH  Final   Special Requests NONE  Final   Culture (A)  Final    <10,000 COLONIES/mL INSIGNIFICANT GROWTH Performed at [facility], [address] [place], [place] [address]    Report Status 08/03/2019 FINAL  Final         Radiology Studies: No results found.      Scheduled Meds: . aspirin EC  81 mg Oral Daily  . atorvastatin  10 mg Oral q1800  . enoxaparin (LOVENOX) injection  40 mg Subcutaneous Q24H  . escitalopram  20 mg Oral Daily   Continuous Infusions: . magnesium sulfate bolus IVPB       LOS: 10 days     Cordelia Poche, MD [facility] 08/05/2019, 4:03 PM  If 7PM-7AM, please contact night-coverage [email or web address]

## 2019-08-06 LAB — CBC
HCT: 34.8 % — ABNORMAL LOW (ref 36.0–46.0)
Hemoglobin: 11 g/dL — ABNORMAL LOW (ref 12.0–15.0)
MCH: 28 pg (ref 26.0–34.0)
MCHC: 31.6 g/dL (ref 30.0–36.0)
MCV: 88.5 fL (ref 80.0–100.0)
Platelets: 324 10*3/uL (ref 150–400)
RBC: 3.93 MIL/uL (ref 3.87–5.11)
RDW: 14.8 % (ref 11.5–15.5)
WBC: 8.8 10*3/uL (ref 4.0–10.5)
nRBC: 0 % (ref 0.0–0.2)

## 2019-08-06 LAB — BASIC METABOLIC PANEL
Anion gap: 9 (ref 5–15)
BUN: 12 mg/dL (ref 8–23)
CO2: 26 mmol/L (ref 22–32)
Calcium: 9.1 mg/dL (ref 8.9–10.3)
Chloride: 103 mmol/L (ref 98–111)
Creatinine, Ser: 1 mg/dL (ref 0.44–1.00)
GFR calc Af Amer: 59 mL/min — ABNORMAL LOW (ref >60)
GFR calc non Af Amer: 51 mL/min — ABNORMAL LOW (ref >60)
Glucose, Bld: 99 mg/dL (ref 70–99)
Potassium: 4.1 mmol/L (ref 3.5–5.1)
Sodium: 138 mmol/L (ref 135–145)

## 2019-08-06 MED ORDER — MIRABEGRON ER 25 MG PO TB24
25.0000 mg | ORAL_TABLET | Freq: Every day | ORAL | Status: DC
  Administered 2019-08-06 – 2019-08-07 (×2): 25 mg via ORAL
  Filled 2019-08-06 (×2): qty 1

## 2019-08-06 NOTE — TOC Progression Note (Signed)
Transition of Care [facility]) - Progression Note    Patient Details  Name: Angelica Bell MRN: [id number] Date of Birth: 12-28-1932  Transition of Care [facility]) CM/SW [place], [place] Phone Number: 08/06/2019, 11:13 AM  Clinical Narrative:    Spoke with Mariann Laster at [facility]; await determination of faxed appeals.    Expected Discharge Plan: Skilled Nursing Facility Barriers to Discharge: Ship broker, Continued Medical Work up  Expected Discharge Plan and Services Expected Discharge Plan: [place] In-house Referral: Clinical Social Work   Post Acute Care Choice: [place] Living arrangements for the past 2 months: Single Family Home                 DME Arranged: N/A DME Agency: NA       HH Arranged: NA HH Agency: NA         Social Determinants of Health (SDOH) Interventions    Readmission Risk Interventions No flowsheet data found.

## 2019-08-06 NOTE — Progress Notes (Signed)
PROGRESS NOTE    Angelica Bell  ZYS:[id number] DOB: Nov 27, 1932 DOA: 07/25/2019 PCP: Heywood Bene, PA-C   Brief Narrative: Angelica Bell is a 85 y.o. female,whypertension, ? Rheumatoid arthritis (found in epic review), osteoporosis, scoliosis, chronic back pain, h/o PE, h/o cellulitis. Patient admitted with acute stroke.   Assessment & Plan:   Principal Problem:   ARF (acute renal failure) (HCC) Active Problems:   Hypokalemia   Essential hypertension   Hyponatremia   Abnormal liver function   Cerebrovascular accident (CVA) ([place])   Acute cystitis   Confusion and disorientation  Acute stroke Resultant left upper extremity deficits. Seen by stroke team. Transthoracic Echocardiogram without embolic source. LDL 58, hemoglobin A1C of 5.9%. Carotid dopplers significant for bilateral 1-39% stenosis and antegrade flow. On Aspirin, Lipitor,  Dysuria Concern for UTI. Started on Cipro. Urine culture with insignificant growth. Discontinued antibiotics  Acute renal failure Peak of 2.76, now resolved. In setting of rhabdomyolysis.  Acute rhabdomyolysis Peak CK of 2500. Now resolved.  Hypokalemia Repleted. -Continue to replete  Elevated LFTs Likely secondary to rhabdomyolysis.  Essential hypertension Mostly controlled.  Chronic back pain -Continue oxycodone  Overactive bladder -Start Myrbetriq   DVT prophylaxis: Lovenox Code Status:   Code Status: Full Code Family Communication: None Disposition Plan: Discharge to SNF when bed in 24 hours.   Consultants:   Neurology  Procedures:   Transthoracic Echocardiogram (07/28/2019) IMPRESSIONS    1. The left ventricle has normal systolic function with an ejection fraction of 60-65%. The cavity size was normal. Left ventricular diastolic function could not be evaluated due to indeterminate diastolic function. Elevated left ventricular  end-diastolic pressure  2. Inadequate images for regional wall motion  assessment. Study ended ealy due to patient request.  3. The right ventricle has normal systolic function. The cavity was normal. There is no increase in right ventricular wall thickness. Right ventricular systolic pressure could not be assessed.  4. The mitral valve is degenerative. Mild calcification of the anterior mitral valve leaflet. There is moderate mitral annular calcification present and trivial mitral regurgitation.  5. The tricuspid valve is grossly normal.  6. The aorta is normal in size and structure.  7. The aortic valve was not well visualized. Aortic valve regurgitation was not assessed by color flow Doppler.  FINDINGS  Left Ventricle: The left ventricle has normal systolic function, with an ejection fraction of 60-65%. The cavity size was normal. There is no increase in left ventricular wall thickness. Left ventricular diastolic function could not be evaluated due to  indeterminate diastolic function. Elevated left ventricular end-diastolic pressure No evidence of left ventricular regional wall motion abnormalities.. Inadequate images for regional wall motion assessment. Study ended ealy due to patient request.  Right Ventricle: The right ventricle has normal systolic function. The cavity was normal. There is no increase in right ventricular wall thickness. Right ventricular systolic pressure could not be assessed.  Left Atrium: Left atrial size was normal in size.  Right Atrium: Right atrial size was normal in size.  Interatrial Septum: No atrial level shunt detected by color flow Doppler.  Pericardium: There is no evidence of pericardial effusion.  Mitral Valve: The mitral valve is degenerative in appearance. Mild calcification of the anterior mitral valve leaflet. There is moderate mitral annular calcification present. Mitral valve regurgitation is trivial by color flow Doppler.  Tricuspid Valve: The tricuspid valve is grossly normal. Tricuspid valve regurgitation  is trivial by color flow Doppler.  Aortic Valve: The aortic valve was not  well visualized Aortic valve regurgitation was not assessed by color flow Doppler.  Pulmonic Valve: The pulmonic valve was not well visualized. Pulmonic valve regurgitation is not visualized by color flow Doppler.  Aorta: The aorta is normal in size and structure.  Venous: The inferior vena cava was not well visualized.  Antimicrobials:  Ciprofloxacin    Subjective: Unhappy about urinary frequency. Eager for discharge  Objective: Vitals:   08/06/19 0329 08/06/19 0748 08/06/19 1132 08/06/19 1521  BP: (!) 147/74 (!) 158/79 (!) 144/65 124/64  Pulse: 80 79 65 69  Resp: 16 20 20 20   Temp: 98.7 F (37.1 C) 98.8 F (37.1 C) 97.7 F (36.5 C) 98.7 F (37.1 C)  TempSrc: Oral Oral Oral Oral  SpO2: 96% 94% 94% 96%  Weight:      Height:        Intake/Output Summary (Last 24 hours) at 08/06/2019 1652 Last data filed at 08/06/2019 1225 Gross per 24 hour  Intake 240 ml  Output -  Net 240 ml   Filed Weights   08/04/19 0506 08/04/19 0512 08/05/19 0405  Weight: 54.9 kg 55.3 kg 55 kg    Examination:  General exam: Appears calm and comfortable  Respiratory system: Clear to auscultation. Respiratory effort normal. Cardiovascular system: S1 & S2 heard, RRR. No murmurs, rubs, gallops or clicks. Gastrointestinal system: Abdomen is nondistended, soft and nontender. No organomegaly or masses felt. Normal bowel sounds heard. Central nervous system: Alert and oriented. Left arm weakness Extremities: No edema. No calf tenderness Skin: No cyanosis. No rashes Psychiatry: Judgement and insight appear normal. Mood & affect appropriate.     Data Reviewed: I have personally reviewed following labs and imaging studies  CBC: Recent Labs  Lab 08/02/19 0745 08/03/19 1036 08/04/19 0508 08/05/19 0424 08/06/19 0706  WBC 7.3 6.4 6.8 7.8 8.8  HGB 11.0* 10.4* 10.8* 11.6* 11.0*  HCT 34.6* 33.1* 33.8* 35.2* 34.8*  MCV  88.7 89.2 88.3 86.9 88.5  PLT 282 277 274 327 829   Basic Metabolic Panel: Recent Labs  Lab 07/31/19 0929  08/02/19 0745 08/03/19 1036 08/04/19 0508 08/05/19 0424 08/06/19 0706  NA  --    < > 141 140 142 140 138  K  --    < > 4.1 3.9 3.7 3.3* 4.1  CL  --    < > 106 103 104 101 103  CO2  --    < > 29 29 27 28 26   GLUCOSE  --    < > 100* 177* 114* 112* 99  BUN  --    < > 13 17 15 12 12   CREATININE  --    < > 1.00 1.04* 1.10* 0.77 1.00  CALCIUM  --    < > 8.7* 8.6* 8.8* 8.8* 9.1  MG 1.2*  --   --   --   --   --   --    < > = values in this interval not displayed.   GFR: Estimated Creatinine Clearance: 35.7 mL/min (by C-G formula based on SCr of 1 mg/dL). Liver Function Tests: Recent Labs  Lab 07/31/19 0337 08/01/19 0558 08/02/19 0745  AST 33 50* 36  ALT 40 32 35  ALKPHOS 62 54 59  BILITOT 0.5 1.1 0.4  PROT 4.6* 3.7* 4.5*  ALBUMIN 2.4* 2.1* 2.4*   No results for input(s): LIPASE, AMYLASE in the last 168 hours. No results for input(s): AMMONIA in the last 168 hours. Coagulation Profile: No results for input(s): INR, PROTIME in  the last 168 hours. Cardiac Enzymes: Recent Labs  Lab 07/31/19 0929  CKTOTAL 88  CKMB 1.7   BNP (last 3 results) No results for input(s): PROBNP in the last 8760 hours. HbA1C: No results for input(s): HGBA1C in the last 72 hours. CBG: No results for input(s): GLUCAP in the last 168 hours. Lipid Profile: No results for input(s): CHOL, HDL, LDLCALC, TRIG, CHOLHDL, LDLDIRECT in the last 72 hours. Thyroid Function Tests: No results for input(s): TSH, T4TOTAL, FREET4, T3FREE, THYROIDAB in the last 72 hours. Anemia Panel: No results for input(s): VITAMINB12, FOLATE, FERRITIN, TIBC, IRON, RETICCTPCT in the last 72 hours. Sepsis Labs: No results for input(s): PROCALCITON, LATICACIDVEN in the last 168 hours.  Recent Results (from the past 240 hour(s))  Culture, Urine     Status: Abnormal   Collection Time: 08/02/19  8:00 AM   Specimen:  Urine, Clean Catch  Result Value Ref Range Status   Specimen Description URINE, CLEAN CATCH  Final   Special Requests NONE  Final   Culture (A)  Final    <10,000 COLONIES/mL INSIGNIFICANT GROWTH Performed at [facility], [address] [place], [place] [address]    Report Status 08/03/2019 FINAL  Final         Radiology Studies: No results found.      Scheduled Meds: . aspirin EC  81 mg Oral Daily  . atorvastatin  10 mg Oral q1800  . enoxaparin (LOVENOX) injection  40 mg Subcutaneous Q24H  . escitalopram  20 mg Oral Daily   Continuous Infusions: . magnesium sulfate bolus IVPB       LOS: 11 days     Cordelia Poche, MD [facility] 08/06/2019, 4:52 PM  If 7PM-7AM, please contact night-coverage [email or web address]

## 2019-08-06 NOTE — TOC Progression Note (Addendum)
Transition of Care [facility]) - Progression Note    Patient Details  Name: Angelica Bell MRN: [id number] Date of Birth: [birth year]  Transition of Care [facility]) CM/SW [place], [place] Phone Number: 08/06/2019, 2:32 PM  Clinical Narrative:    4:39pm- spoke with [facility], pt approved for 5 days, first review on August 11th 2020. [place] ref #[id number]  3:26pm- Received call from [place] with [facility]. Pt denial has been overturned and pt has been approved for SNF at [facility]. A new COVID test has been requested from MD for discharge. Pt daughter called and aware of approval; she has completed paperwork for SNF.  2:32pm- Additional PT and MD notes sent to Angelica Bell at [phone]   Expected Discharge Plan: [place] Barriers to Discharge: Ship broker, Continued Medical Work up  [employer] and Services Expected Discharge Plan: [place] In-house Referral: Clinical Social Work   Post Acute Care Choice: [place] Living arrangements for the past 2 months: Single Family Home                 DME Arranged: N/A DME Agency: NA       HH Arranged: NA HH Agency: NA         Social Determinants of Health (SDOH) Interventions    Readmission Risk Interventions No flowsheet data found.

## 2019-08-06 NOTE — Progress Notes (Signed)
Physical Therapy Treatment Patient Details Name: MEILIN BROSH MRN: [id number] DOB: [birth year] Today's Date: 08/06/2019    History of Present Illness Pt is an 85 y/o female admitted after falling in the bathroom, recurrent falls, and FTT. Xrays negative for fracture.  CT of the head for AMS demonstrated chronic ischemic changes only. However, her MRI brain revealed scattered acute small infarctions primarily within the white matter of the cerebral hemispheres but with some cortical involvement most notably in the left occipital region.  This is concerning for possible embolic infarctions. PMH includes:  RA, HTN, osteoporosis, scoliosis, chronic back pain, h/o PE, h/o cellulitis, s/p Rt THA     PT Comments    Pt refusing to wear corset during session and upset that it was ordered for her.  Pt agreeable to ambulate in room only.  Con't to recommend SNF.   Follow Up Recommendations  SNF     Equipment Recommendations  None recommended by PT    Recommendations for Other Services       Precautions / Restrictions Precautions Precautions: Fall Required Braces or Orthoses: Spinal Brace Spinal Brace: Other (comment) Spinal Brace Comments: would not wear Restrictions Weight Bearing Restrictions: No    Mobility  Bed Mobility Overal bed mobility: Needs Assistance Bed Mobility: Supine to Sit     Supine to sit: Min guard     General bed mobility comments: Pt able to get to EOB with MIN/guard  Transfers Overall transfer level: Needs assistance Equipment used: Rolling walker (2 wheeled) Transfers: Sit to/from Stand Sit to Stand: Min assist         General transfer comment: Cues for proper hand placement and MIN A for power and to steady  Ambulation/Gait Ambulation/Gait assistance: Min assist Gait Distance (Feet): 25 Feet Assistive device: Rolling walker (2 wheeled) Gait Pattern/deviations: Trunk flexed;Step-through pattern Gait velocity: decreased   General Gait Details:  Cues for RW placement and to stay closer to it.  Pt declined ambulation outside of the room.   Stairs             Wheelchair Mobility    Modified Rankin (Stroke Patients Only) Modified Rankin (Stroke Patients Only) Pre-Morbid Rankin Score: No significant disability Modified Rankin: Moderate disability     Balance Overall balance assessment: Needs assistance Sitting-balance support: Feet supported Sitting balance-Leahy Scale: Fair       Standing balance-Leahy Scale: Poor                              Cognition Arousal/Alertness: Awake/alert Behavior During Therapy: WFL for tasks assessed/performed                         Memory: Decreased short-term memory   Safety/Judgement: Decreased awareness of safety;Decreased awareness of deficits Awareness: Emergent   General Comments: Pt appears to not be aware of the deficits      Exercises      General Comments General comments (skin integrity, edema, etc.): Pt refused to wear corset      Pertinent Vitals/Pain Pain Assessment: Faces Faces Pain Scale: Hurts a little bit Pain Location: back Pain Descriptors / Indicators: Grimacing Pain Intervention(s): Limited activity within patient's tolerance;Monitored during session;Premedicated before session    Home Living                      Prior Function  PT Goals (current goals can now be found in the care plan section) Acute Rehab PT Goals PT Goal Formulation: With patient Time For Goal Achievement: 08/13/19 Potential to Achieve Goals: Good Progress towards PT goals: Progressing toward goals    Frequency    Min 3X/week      PT Plan Current plan remains appropriate    Co-evaluation              AM-PAC PT "6 Clicks" Mobility   Outcome Measure  Help needed turning from your back to your side while in a flat bed without using bedrails?: A Little Help needed moving from lying on your back to sitting on the  side of a flat bed without using bedrails?: A Little Help needed moving to and from a bed to a chair (including a wheelchair)?: A Little Help needed standing up from a chair using your arms (e.g., wheelchair or bedside chair)?: A Little Help needed to walk in hospital room?: A Little Help needed climbing 3-5 steps with a railing? : A Lot 6 Click Score: 17    End of Session Equipment Utilized During Treatment: Gait belt Activity Tolerance: Patient tolerated treatment well Patient left: in chair;with call bell/phone within reach;with chair alarm set Nurse Communication: Mobility status PT Visit Diagnosis: Other abnormalities of gait and mobility (R26.89);Other symptoms and signs involving the nervous system (H41.740)     Time: 8144-8185 PT Time Calculation (min) (ACUTE ONLY): 20 min  Charges:  $Gait Training: 8-22 mins                     Rema Lievanos L. [place], [place] Pager [phone] 08/06/2019    Galen Manila 08/06/2019, 12:01 PM

## 2019-08-07 LAB — BASIC METABOLIC PANEL
Anion gap: 10 (ref 5–15)
BUN: 16 mg/dL (ref 8–23)
CO2: 27 mmol/L (ref 22–32)
Calcium: 9.2 mg/dL (ref 8.9–10.3)
Chloride: 100 mmol/L (ref 98–111)
Creatinine, Ser: 0.91 mg/dL (ref 0.44–1.00)
GFR calc Af Amer: >60 mL/min (ref >60)
GFR calc non Af Amer: 58 mL/min — ABNORMAL LOW (ref >60)
Glucose, Bld: 121 mg/dL — ABNORMAL HIGH (ref 70–99)
Potassium: 3.9 mmol/L (ref 3.5–5.1)
Sodium: 137 mmol/L (ref 135–145)

## 2019-08-07 LAB — CBC
HCT: 33.8 % — ABNORMAL LOW (ref 36.0–46.0)
Hemoglobin: 10.7 g/dL — ABNORMAL LOW (ref 12.0–15.0)
MCH: 28.2 pg (ref 26.0–34.0)
MCHC: 31.7 g/dL (ref 30.0–36.0)
MCV: 89.2 fL (ref 80.0–100.0)
Platelets: 303 10*3/uL (ref 150–400)
RBC: 3.79 MIL/uL — ABNORMAL LOW (ref 3.87–5.11)
RDW: 14.8 % (ref 11.5–15.5)
WBC: 9.6 10*3/uL (ref 4.0–10.5)
nRBC: 0 % (ref 0.0–0.2)

## 2019-08-07 LAB — SARS CORONAVIRUS 2 BY RT PCR (HOSPITAL ORDER, PERFORMED IN ~~LOC~~ HOSPITAL LAB): SARS Coronavirus 2: NEGATIVE

## 2019-08-07 MED ORDER — ESCITALOPRAM OXALATE 20 MG PO TABS: 20.0000 mg | ORAL_TABLET | Freq: Every day | ORAL | Status: DC

## 2019-08-07 MED ORDER — OXYCODONE HCL 30 MG PO TABS: 30.0000 mg | ORAL_TABLET | Freq: Three times a day (TID) | ORAL | 0 refills | Status: DC | PRN

## 2019-08-07 MED ORDER — MIRABEGRON ER 25 MG PO TB24: 25.0000 mg | ORAL_TABLET | Freq: Every day | ORAL | Status: DC

## 2019-08-07 MED ORDER — MUSCLE RUB 10-15 % EX CREA: 1.0000 "application " | TOPICAL_CREAM | CUTANEOUS | 0 refills | Status: DC | PRN

## 2019-08-07 MED ORDER — ATORVASTATIN CALCIUM 10 MG PO TABS: 10.0000 mg | ORAL_TABLET | Freq: Every day | ORAL | Status: DC

## 2019-08-07 NOTE — Progress Notes (Signed)
Patient for discharge to [facility] rehab today.  Telemetry removed, vital signs stable.  AVS dicussed. Report given to RN receiving patient.  Awating [facility] for transport to facility.

## 2019-08-07 NOTE — Discharge Instructions (Signed)
Angelica Bell,  You were in the hospital with a stroke. You will be going to rehab. Please take your medications as prescribed.

## 2019-08-07 NOTE — Plan of Care (Signed)
  Problem: Education: Goal: Knowledge of General Education information will improve Description: Including pain rating scale, medication(s)/side effects and non-pharmacologic comfort measures Outcome: Completed/Met   Problem: Health Behavior/Discharge Planning: Goal: Ability to manage health-related needs will improve Outcome: Completed/Met   Problem: Clinical Measurements: Goal: Ability to maintain clinical measurements within normal limits will improve Outcome: Completed/Met Goal: Will remain free from infection Outcome: Completed/Met Goal: Diagnostic test results will improve Outcome: Completed/Met Goal: Respiratory complications will improve Outcome: Completed/Met Goal: Cardiovascular complication will be avoided Outcome: Completed/Met   Problem: Activity: Goal: Risk for activity intolerance will decrease Outcome: Completed/Met   Problem: Elimination: Goal: Will not experience complications related to bowel motility Outcome: Completed/Met Goal: Will not experience complications related to urinary retention Outcome: Completed/Met   Problem: Pain Managment: Goal: General experience of comfort will improve Outcome: Completed/Met   Problem: Skin Integrity: Goal: Risk for impaired skin integrity will decrease Outcome: Completed/Met   Problem: Education: Goal: Knowledge of disease or condition will improve Outcome: Completed/Met Goal: Knowledge of secondary prevention will improve Outcome: Completed/Met   Problem: Ischemic Stroke/TIA Tissue Perfusion: Goal: Complications of ischemic stroke/TIA will be minimized Outcome: Completed/Met

## 2019-08-07 NOTE — Discharge Summary (Signed)
Physician Discharge Summary  Angelica Bell:[id number] DOB: 11-11-1932 DOA: 07/25/2019  PCP: Heywood Bene, PA-C  Admit date: 07/25/2019 Discharge date: 08/07/2019  Admitted From: Home Disposition: SNF  Recommendations for Outpatient Follow-up:  1. Follow up with PCP in 1 week 2. Follow up with Neurology in 4 weeks 3. Please obtain BMP/CBC in one week 4. Please follow up on the following pending results: None  Home Health: SNF  Discharge Condition: Stable CODE STATUS: Full code Diet recommendation: Heart healthy   Brief/Interim Summary:  Admission HPI written by Jani Gravel, MD   HPI:   Angelica Bell  is a 85 y.o. female,w hypertension, ? Rheumatoid arthritis (found in epic [facility]), osteoporosis, scoliosis,  chronic back pain, h/o PE (?? In our medical records in [facility] but can't find CTA chest or in Epic everywhere)  , h/o cellulitis, presents with c/o failure to thrive, recurrent falls at home.  Pt states that her poor po intake is due to not being able to get food ?    Hospital course:  Acute stroke Resultant left upper extremity deficits. Seen by stroke team. Transthoracic Echocardiogram without embolic source. LDL 58, hemoglobin A1C of 5.9%. Carotid dopplers significant for bilateral 1-39% stenosis and antegrade flow. Per neurology, likely etiology is dehydration and resultant hypotensive episodes. On Aspirin, Lipitor on discharge.  Dysuria Concern for UTI. Started on Cipro. Urine culture with insignificant growth. Discontinued antibiotics  Acute renal failure Peak of 2.76, now resolved. In setting of rhabdomyolysis.  Acute rhabdomyolysis Peak CK of 2500. Now resolved.  Hypokalemia Repleted.  Elevated LFTs Likely secondary to rhabdomyolysis.  Essential hypertension Mostly controlled.  Chronic back pain Continue oxycodone prn  Overactive bladder Start Myrbetriq  Discharge Diagnoses:  Principal Problem:   ARF (acute renal failure)  (HCC) Active Problems:   Hypokalemia   Essential hypertension   Hyponatremia   Abnormal liver function   Cerebrovascular accident (CVA) ([place])   Acute cystitis   Confusion and disorientation    Discharge Instructions  Discharge Instructions    Increase activity slowly   Complete by: As directed      Allergies as of 08/07/2019      Reactions   Hctz [hydrochlorothiazide]    Significant and rapid hyponatremia (TIH)   Amoxicillin-pot Clavulanate Hives, Diarrhea   Has patient had a PCN reaction causing immediate rash, facial/tongue/throat swelling, SOB or lightheadedness with hypotension: yes Has patient had a PCN reaction causing severe rash involving mucus membranes or skin necrosis: yes Has patient had a PCN reaction that required hospitalization : unknown Has patient had a PCN reaction occurring within the last 10 years: yes If all of the above answers are "NO", then may proceed with Cephalosporin use.   Lisinopril-hydrochlorothiazide Other (See Comments)   Tired- no energy to do anything..   Sulfa Antibiotics Hives, Nausea And Vomiting   Sulfonamide Derivatives Hives      Medication List    TAKE these medications   aspirin EC 81 MG tablet Take 1 tablet (81 mg total) by mouth 2 (two) times daily. What changed: when to take this   atorvastatin 10 MG tablet Commonly known as: LIPITOR Take 1 tablet (10 mg total) by mouth daily at 6 PM.   escitalopram 20 MG tablet Commonly known as: LEXAPRO Take 1 tablet (20 mg total) by mouth daily. Start taking on: August 08, 2019   mirabegron ER 25 MG Tb24 tablet Commonly known as: MYRBETRIQ Take 1 tablet (25 mg total) by mouth daily. Start  taking on: August 08, 2019   Muscle Rub 10-15 % Crea Apply 1 application topically as needed for muscle pain.   oxycodone 30 MG immediate release tablet Commonly known as: ROXICODONE Take 1 tablet (30 mg total) by mouth 3 (three) times daily as needed for pain.      Follow-up Information      [facility]. Schedule an appointment as soon as possible for a visit in 4 week(s).   Specialty: Neurology Contact information: [place] [phone]         Allergies  Allergen Reactions   Hctz [Hydrochlorothiazide]     Significant and rapid hyponatremia (TIH)   Amoxicillin-Pot Clavulanate Hives and Diarrhea    Has patient had a PCN reaction causing immediate rash, facial/tongue/throat swelling, SOB or lightheadedness with hypotension: yes Has patient had a PCN reaction causing severe rash involving mucus membranes or skin necrosis: yes Has patient had a PCN reaction that required hospitalization : unknown Has patient had a PCN reaction occurring within the last 10 years: yes If all of the above answers are "NO", then may proceed with Cephalosporin use.    Lisinopril-Hydrochlorothiazide Other (See Comments)    Tired- no energy to do anything..   Sulfa Antibiotics Hives and Nausea And Vomiting   Sulfonamide Derivatives Hives    Consultations:  Neurology   Procedures/Studies: Dg Chest 2 View  Result Date: 07/26/2019 CLINICAL DATA:  Fall EXAM: CHEST - 2 VIEW COMPARISON:  04/22/2018 FINDINGS: Lungs are clear.  No pleural effusion or pneumothorax. The heart is normal in size. S shaped thoracolumbar scoliosis. Posttraumatic deformity involving the left humeral head/neck, chronic. IMPRESSION: No evidence of acute cardiopulmonary disease. Electronically Signed   By: Julian Hy M.D.   On: 07/26/2019 01:00   Dg Pelvis 1-2 Views  Result Date: 07/26/2019 CLINICAL DATA:  Fall, bilateral leg pain EXAM: PELVIS - 1-2 VIEW COMPARISON:  CT pelvis dated 09/10/2016 FINDINGS: No fracture or dislocation is seen. Visualized bony pelvis appears intact. Right hip arthroplasty. Status post ORIF of a prior intertrochanteric left hip fracture. Degenerative changes the lower lumbar spine. IMPRESSION: Status post right hip  arthroplasty. Status post ORIF of a prior intertrochanteric left hip fracture. No acute fracture is seen. Electronically Signed   By: Julian Hy M.D.   On: 07/26/2019 01:01   Dg Wrist 2 Views Left  Result Date: 07/30/2019 CLINICAL DATA:  Acute left wrist pain. EXAM: LEFT WRIST - 2 VIEW COMPARISON:  No recent prior. FINDINGS: Soft tissue swelling. No radiopaque foreign body. No acute bony abnormality. No evidence of fracture or dislocation. Osteopenia. Mild degenerative changes radiocarpal joint and first carpometacarpal joint. No evidence of inflammatory arthropathy. IMPRESSION: Soft tissue swelling. No radiopaque foreign body. Osteopenia mild degenerative changes. No acute abnormality. No evidence of inflammatory arthropathy. Electronically Signed   By: Marcello Moores  Register   On: 07/30/2019 15:03   Dg Wrist Complete Right  Result Date: 07/26/2019 CLINICAL DATA:  Fall, right wrist pain EXAM: RIGHT WRIST - COMPLETE 3+ VIEW COMPARISON:  None. FINDINGS: No fracture or dislocation is seen. The joint spaces are preserved. Visualized soft tissues are within normal limits. IMPRESSION: Negative. Electronically Signed   By: Julian Hy M.D.   On: 07/26/2019 00:59   Dg Ankle Complete Right  Result Date: 07/26/2019 CLINICAL DATA:  85 y.o. female,w hypertension, ? Rheumatoid arthritis (found in epic [facility]), osteoporosis, scoliosis, chronic back pain, h/o PE (?? In our medical records in [facility] but can't find  CTA chest or in Epic everywhere) , h/o cellulitis. EXAM: RIGHT ANKLE - COMPLETE 3+ VIEW COMPARISON:  None. FINDINGS: There is no evidence of fracture, dislocation, or joint effusion. There is no evidence of arthropathy or other focal bone abnormality. Soft tissues are unremarkable. IMPRESSION: Negative. Electronically Signed   By: Nolon Nations M.D.   On: 07/26/2019 13:38   Ct Head Wo Contrast  Result Date: 07/27/2019 CLINICAL DATA:  Rule out cerebral hemorrhage. Hypertension. Recurrent falls EXAM:  CT HEAD WITHOUT CONTRAST TECHNIQUE: Contiguous axial images were obtained from the base of the skull through the vertex without intravenous contrast. COMPARISON:  CT head 01/23/2019 FINDINGS: Brain: Generalized atrophy. Chronic microvascular ischemic changes in the white matter bilaterally. Chronic infarct right cerebellum unchanged. Negative for acute infarct. Negative for hemorrhage or mass. No midline shift. Vascular: Negative for hyperdense vessel Skull: Negative Sinuses/Orbits: Negative Other: None IMPRESSION: No acute abnormality. Atrophy and chronic ischemic changes as above. Electronically Signed   By: Franchot Gallo M.D.   On: 07/27/2019 12:17   Mr Angio Head Wo Contrast  Result Date: 07/29/2019 CLINICAL DATA:  Stroke EXAM: MRA HEAD WITHOUT CONTRAST TECHNIQUE: Angiographic images of the Circle of Willis were obtained using MRA technique without intravenous contrast. COMPARISON:  MRI head 07/27/2019 FINDINGS: Image quality degraded by extensive motion. This examination is significantly limited in quality by motion. Both vertebral arteries are patent. The basilar is patent. Posterior cerebral arteries patent bilaterally. Internal carotid artery patent bilaterally. Signal loss in the cavernous carotid bilaterally is likely artifactual. Anterior middle cerebral arteries appear patent bilaterally. Both anterior cerebral arteries supplied from the left. IMPRESSION: Negative for large vessel occlusion. Image quality is significantly degraded by extensive motion. Electronically Signed   By: Franchot Gallo M.D.   On: 07/29/2019 20:51   Mr Brain Wo Contrast  Result Date: 07/27/2019 CLINICAL DATA:  Worsening confusion over the last few months. Ataxia. EXAM: MRI HEAD WITHOUT CONTRAST TECHNIQUE: Multiplanar, multiecho pulse sequences of the brain and surrounding structures were obtained without intravenous contrast. COMPARISON:  CT same day FINDINGS: Brain: There is an old small vessel infarction in the right  cerebellum. Mild chronic small-vessel ischemic change affects the pons. There is a background pattern of chronic small vessel disease throughout the cerebral hemispheric white matter. There is scattered foci of acute infarction noted, within the right frontoparietal vertex deep white matter, the left occipital cortical brain and subcortical white matter, and the left parietal white matter. Pattern is most consistent with hypoperfusion infarctions/watershed infarctions. Micro embolic infarctions are possible, but I would expect more cortical involvement. The differential diagnosis does include other white matter processes such as ADEM and inflammatory processes such as Lyme disease. No sign of mass lesion, hemorrhage, hydrocephalus or extra-axial collection. Vascular: Major vessels at the base of the brain show flow. Skull and upper cervical spine: Negative Sinuses/Orbits: Clear/normal Other: None IMPRESSION: Background pattern of small-vessel disease with old small vessel right cerebellar infarction and widespread white matter changes. Scattered acute small infarctions primarily within the white matter of the cerebral hemispheres but with some cortical involvement particularly in the left occipital region. Favored diagnosis is hypoperfusion or watershed infarctions. Embolic infarctions are possible. Other processes including ADEM and Lyme disease could give this appearance but seem unlikely. Electronically Signed   By: Nelson Chimes M.D.   On: 07/27/2019 19:46   US Abdomen Complete  Result Date: 07/26/2019 CLINICAL DATA:  Abnormal liver function tests. EXAM: ABDOMEN ULTRASOUND COMPLETE COMPARISON:  None. FINDINGS: Gallbladder: Stones almost completely  filling the gallbladder. Discrete measurement is difficult but stones measure up to approximately 1.5 cm in diameter. There is no wall thickening or pericholecystic fluid. Common bile duct: Diameter: 0.3 cm Liver: No focal lesion identified. Within normal limits in  parenchymal echogenicity. Portal vein is patent on color Doppler imaging with normal direction of blood flow towards the liver. IVC: No abnormality visualized. Pancreas: Visualized portion unremarkable. Spleen: Size and appearance within normal limits. Right Kidney: Length: 9.4 cm. Echogenicity within normal limits. No mass or hydronephrosis visualized. Left Kidney: Length: 8 cm. Echogenicity within normal limits. No mass or hydronephrosis visualized. Abdominal aorta: No aneurysm visualized. Other findings: None. IMPRESSION: Gallstones without evidence of cholecystitis. The exam is otherwise negative. Electronically Signed   By: Inge Rise M.D.   On: 07/26/2019 10:09   Dg Hip Unilat With Pelvis 2-3 Views Right  Result Date: 07/26/2019 CLINICAL DATA:  85 y.o. female,w hypertension, ? Rheumatoid arthritis (found in epic [facility]), osteoporosis, scoliosis, chronic back pain, h/o PE (?? In our medical records in [facility] but can't find CTA chest or in Epic everywhere), history of cellulitis. EXAM: DG HIP (WITH OR WITHOUT PELVIS) 2-3V RIGHT COMPARISON:  07/26/2019, 01/23/2019 FINDINGS: Status post ORIF of the LEFT hip. Patient has had RIGHT hip arthroplasty. The hardware is intact. No evidence for acute fracture or subluxation. Degenerative changes are seen in the spine. IMPRESSION: 1. No evidence for acute abnormality. 2. Hardware intact. Electronically Signed   By: Nolon Nations M.D.   On: 07/26/2019 13:41   Dg Femur Min 2 Views Left  Result Date: 07/26/2019 CLINICAL DATA:  Fall, bilateral leg pain EXAM: LEFT FEMUR 2 VIEWS COMPARISON:  Intraoperative left femur radiographs dated 02/17/2017 FINDINGS: Status post ORIF of an intertrochanteric left hip fracture. No evidence of hardware loosening or complication. No evidence of acute fracture or dislocation. Left hip joint space is preserved. Distal left femur is unremarkable. IMPRESSION: Status post ORIF of an intertrochanteric left hip fracture, as above, without  evidence of complication. No evidence of acute fracture or dislocation. Electronically Signed   By: Julian Hy M.D.   On: 07/26/2019 01:03   Dg Femur Min 2 Views Right  Result Date: 07/26/2019 CLINICAL DATA:  Fall, bilateral leg pain EXAM: RIGHT FEMUR 2 VIEWS COMPARISON:  None. FINDINGS: Status post right hip hemiarthroplasty. No acetabular component. No evidence of complication. No evidence of fracture or dislocation. Distal femur is unremarkable. IMPRESSION: Status post right hip hemiarthroplasty, without evidence of complication. Electronically Signed   By: Julian Hy M.D.   On: 07/26/2019 01:02   Vas US Carotid  Result Date: 07/29/2019 Carotid Arterial Duplex Study Indications:       CVA. Risk Factors:      Hypertension. Comparison Study:  prior study done 04/07/2004 Performing Technologist: Abram Sander RVS  Examination Guidelines: A complete evaluation includes B-mode imaging, spectral Doppler, color Doppler, and power Doppler as needed of all accessible portions of each vessel. Bilateral testing is considered an integral part of a complete examination. Limited examinations for reoccurring indications may be performed as noted.  Right Carotid Findings: +----------+--------+--------+--------+-----------+--------+             PSV cm/s EDV cm/s Stenosis Describe    Comments  +----------+--------+--------+--------+-----------+--------+  CCA Prox   51       12                homogeneous           +----------+--------+--------+--------+-----------+--------+  CCA Distal 50  14                homogeneous           +----------+--------+--------+--------+-----------+--------+  ICA Prox   65       23       1-39%    homogeneous           +----------+--------+--------+--------+-----------+--------+  ICA Distal 87       32                                      +----------+--------+--------+--------+-----------+--------+  ECA        92                                                +----------+--------+--------+--------+-----------+--------+ +----------+--------+-------+--------+-------------------+             PSV cm/s EDV cms Describe Arm Pressure (mmHG)  +----------+--------+-------+--------+-------------------+  Subclavian 74                                             +----------+--------+-------+--------+-------------------+ +---------+--------+--+--------+--+---------+  Vertebral PSV cm/s 39 EDV cm/s 14 Antegrade  +---------+--------+--+--------+--+---------+  Left Carotid Findings: +----------+--------+--------+--------+-----------+--------+             PSV cm/s EDV cm/s Stenosis Describe    Comments  +----------+--------+--------+--------+-----------+--------+  CCA Prox   90       23                homogeneous           +----------+--------+--------+--------+-----------+--------+  CCA Distal 45       18                homogeneous           +----------+--------+--------+--------+-----------+--------+  ICA Prox   122      33       1-39%    homogeneous tortuous  +----------+--------+--------+--------+-----------+--------+  ICA Distal 80       35                                      +----------+--------+--------+--------+-----------+--------+  ECA        79                                               +----------+--------+--------+--------+-----------+--------+ +----------+--------+--------+--------+-------------------+  Subclavian PSV cm/s EDV cm/s Describe Arm Pressure (mmHG)  +----------+--------+--------+--------+-------------------+             90                                              +----------+--------+--------+--------+-------------------+ +---------+--------+--+--------+--+---------+  Vertebral PSV cm/s 57 EDV cm/s 19 Antegrade  +---------+--------+--+--------+--+---------+  Summary: Right Carotid: Velocities in the right ICA are consistent with a 1-39% stenosis. Left Carotid: Velocities in the left ICA are consistent  with a 1-39% stenosis. Vertebrals: Bilateral  vertebral arteries demonstrate antegrade flow. *See table(s) above for measurements and observations.  Electronically signed by Antony Contras MD on 07/29/2019 at 8:31:13 AM.    Final       Transthoracic Echocardiogram (07/28/2019) IMPRESSIONS   1. The left ventricle has normal systolic function with an ejection fraction of 60-65%. The cavity size was normal. Left ventricular diastolic function could not be evaluated due to indeterminate diastolic function. Elevated left ventricular  end-diastolic pressure 2. Inadequate images for regional wall motion assessment. Study ended ealy due to patient request. 3. The right ventricle has normal systolic function. The cavity was normal. There is no increase in right ventricular wall thickness. Right ventricular systolic pressure could not be assessed. 4. The mitral valve is degenerative. Mild calcification of the anterior mitral valve leaflet. There is moderate mitral annular calcification present and trivial mitral regurgitation. 5. The tricuspid valve is grossly normal. 6. The aorta is normal in size and structure. 7. The aortic valve was not well visualized. Aortic valve regurgitation was not assessed by color flow Doppler.  FINDINGS Left Ventricle: The left ventricle has normal systolic function, with an ejection fraction of 60-65%. The cavity size was normal. There is no increase in left ventricular wall thickness. Left ventricular diastolic function could not be evaluated due to  indeterminate diastolic function. Elevated left ventricular end-diastolic pressure No evidence of left ventricular regional wall motion abnormalities.. Inadequate images for regional wall motion assessment. Study ended ealy due to patient request.  Right Ventricle: The right ventricle has normal systolic function. The cavity was normal. There is no increase in right ventricular wall thickness. Right ventricular systolic pressure could not be assessed.  Left  Atrium: Left atrial size was normal in size.  Right Atrium: Right atrial size was normal in size.  Interatrial Septum: No atrial level shunt detected by color flow Doppler.  Pericardium: There is no evidence of pericardial effusion.  Mitral Valve: The mitral valve is degenerative in appearance. Mild calcification of the anterior mitral valve leaflet. There is moderate mitral annular calcification present. Mitral valve regurgitation is trivial by color flow Doppler.  Tricuspid Valve: The tricuspid valve is grossly normal. Tricuspid valve regurgitation is trivial by color flow Doppler.  Aortic Valve: The aortic valve was not well visualized Aortic valve regurgitation was not assessed by color flow Doppler.  Pulmonic Valve: The pulmonic valve was not well visualized. Pulmonic valve regurgitation is not visualized by color flow Doppler.  Aorta: The aorta is normal in size and structure.  Venous: The inferior vena cava was not well visualized.   Subjective: Some incontinence overnight. No other issues. Arm still weak  Discharge Exam: Vitals:   08/07/19 0724 08/07/19 1223  BP: (!) 127/44 (!) 129/92  Pulse: 82 85  Resp: 18 18  Temp: 98.4 F (36.9 C) 98.3 F (36.8 C)  SpO2: 94% 94%   Vitals:   08/06/19 2354 08/07/19 0355 08/07/19 0724 08/07/19 1223  BP: (!) 150/66 140/68 (!) 127/44 (!) 129/92  Pulse: 75 73 82 85  Resp: 18 18 18 18   Temp: 98.9 F (37.2 C) 98 F (36.7 C) 98.4 F (36.9 C) 98.3 F (36.8 C)  TempSrc: Oral Oral Oral Oral  SpO2: 95% 95% 94% 94%  Weight:      Height:        General: Pt is alert, awake, not in acute distress Cardiovascular: RRR, S1/S2 +, no rubs, no gallops Respiratory: CTA bilaterally, no wheezing, no rhonchi Abdominal:  Soft, NT, ND, bowel sounds + Extremities: no edema, no cyanosis Neuro: 4/5 LUE flexion/extension and 5/5 grip    The results of significant diagnostics from this hospitalization (including imaging, microbiology,  ancillary and laboratory) are listed below for reference.     Microbiology: Recent Results (from the past 240 hour(s))  Culture, Urine     Status: Abnormal   Collection Time: 08/02/19  8:00 AM   Specimen: Urine, Clean Catch  Result Value Ref Range Status   Specimen Description URINE, CLEAN CATCH  Final   Special Requests NONE  Final   Culture (A)  Final    <10,000 COLONIES/mL INSIGNIFICANT GROWTH Performed at [facility], [address] [place], [place] [address]    Report Status 08/03/2019 FINAL  Final  SARS Coronavirus 2 [facility] order, Performed in [facility] hospital lab) Nasopharyngeal Nasopharyngeal Swab     Status: None   Collection Time: 08/07/19 10:23 AM   Specimen: Nasopharyngeal Swab  Result Value Ref Range Status   SARS Coronavirus 2 NEGATIVE NEGATIVE Final    Comment: (NOTE) If result is NEGATIVE SARS-CoV-2 target nucleic acids are NOT DETECTED. The SARS-CoV-2 RNA is generally detectable in upper and lower  respiratory specimens during the acute phase of infection. The lowest  concentration of SARS-CoV-2 viral copies this assay can detect is 250  copies / mL. A negative result does not preclude SARS-CoV-2 infection  and should not be used as the sole basis for treatment or other  patient management decisions.  A negative result may occur with  improper specimen collection / handling, submission of specimen other  than nasopharyngeal swab, presence of viral mutation(s) within the  areas targeted by this assay, and inadequate number of viral copies  (<250 copies / mL). A negative result must be combined with clinical  observations, patient history, and epidemiological information. If result is POSITIVE SARS-CoV-2 target nucleic acids are DETECTED. The SARS-CoV-2 RNA is generally detectable in upper and lower  respiratory specimens dur ing the acute phase of infection.  Positive  results are indicative of active infection with SARS-CoV-2.  Clinical    correlation with patient history and other diagnostic information is  necessary to determine patient infection status.  Positive results do  not rule out bacterial infection or co-infection with other viruses. If result is PRESUMPTIVE POSTIVE SARS-CoV-2 nucleic acids MAY BE PRESENT.   A presumptive positive result was obtained on the submitted specimen  and confirmed on repeat testing.  While 2019 novel coronavirus  (SARS-CoV-2) nucleic acids may be present in the submitted sample  additional confirmatory testing may be necessary for epidemiological  and / or clinical management purposes  to differentiate between  SARS-CoV-2 and other Sarbecovirus currently known to infect humans.  If clinically indicated additional testing with an alternate test  methodology [phone]) is advised. The SARS-CoV-2 RNA is generally  detectable in upper and lower respiratory sp ecimens during the acute  phase of infection. The expected result is Negative. Fact Sheet for Patients:  StrictlyIdeas.no Fact Sheet for Healthcare Providers: BankingDealers.co.za This test is not yet approved or cleared by the [place] FDA and has been authorized for detection and/or diagnosis of SARS-CoV-2 by FDA under an Emergency Use Authorization (EUA).  This EUA will remain in effect (meaning this test can be used) for the duration of the COVID-19 declaration under Section 564(b)(1) of the Act, 21 U.S.C. section 360bbb-3(b)(1), unless the authorization is terminated or revoked sooner. Performed at [facility], [place]  [address] [place], [place] [address]      Labs: BNP (last 3 results) No results for input(s): BNP in the last 8760 hours. Basic Metabolic Panel: Recent Labs  Lab 08/03/19 1036 08/04/19 0508 08/05/19 0424 08/06/19 0706 08/07/19 0320  NA 140 142 140 138 137  K 3.9 3.7 3.3* 4.1 3.9  CL 103 104 101 103 100  CO2 29 27 28 26 27   GLUCOSE 177* 114*  112* 99 121*  BUN 17 15 12 12 16   CREATININE 1.04* 1.10* 0.77 1.00 0.91  CALCIUM 8.6* 8.8* 8.8* 9.1 9.2   Liver Function Tests: Recent Labs  Lab 08/01/19 0558 08/02/19 0745  AST 50* 36  ALT 32 35  ALKPHOS 54 59  BILITOT 1.1 0.4  PROT 3.7* 4.5*  ALBUMIN 2.1* 2.4*   No results for input(s): LIPASE, AMYLASE in the last 168 hours. No results for input(s): AMMONIA in the last 168 hours. CBC: Recent Labs  Lab 08/03/19 1036 08/04/19 0508 08/05/19 0424 08/06/19 0706 08/07/19 0320  WBC 6.4 6.8 7.8 8.8 9.6  HGB 10.4* 10.8* 11.6* 11.0* 10.7*  HCT 33.1* 33.8* 35.2* 34.8* 33.8*  MCV 89.2 88.3 86.9 88.5 89.2  PLT 277 274 327 324 303   Cardiac Enzymes: No results for input(s): CKTOTAL, CKMB, CKMBINDEX, TROPONINI in the last 168 hours. BNP: Invalid input(s): POCBNP CBG: No results for input(s): GLUCAP in the last 168 hours. D-Dimer No results for input(s): DDIMER in the last 72 hours. Hgb A1c No results for input(s): HGBA1C in the last 72 hours. Lipid Profile No results for input(s): CHOL, HDL, LDLCALC, TRIG, CHOLHDL, LDLDIRECT in the last 72 hours. Thyroid function studies No results for input(s): TSH, T4TOTAL, T3FREE, THYROIDAB in the last 72 hours.  Invalid input(s): FREET3 Anemia work up No results for input(s): VITAMINB12, FOLATE, FERRITIN, TIBC, IRON, RETICCTPCT in the last 72 hours. Urinalysis    Component Value Date/Time   COLORURINE YELLOW 08/01/2019 1834   APPEARANCEUR HAZY (A) 08/01/2019 1834   LABSPEC 1.014 08/01/2019 1834   PHURINE 5.0 08/01/2019 1834   GLUCOSEU NEGATIVE 08/01/2019 1834   HGBUR SMALL (A) 08/01/2019 1834   HGBUR negative 04/24/2009 1635   BILIRUBINUR NEGATIVE 08/01/2019 1834   BILIRUBINUR negative 05/29/2011 1439   KETONESUR NEGATIVE 08/01/2019 1834   PROTEINUR NEGATIVE 08/01/2019 1834   UROBILINOGEN 0.2 01/26/2014 1912   NITRITE POSITIVE (A) 08/01/2019 1834   LEUKOCYTESUR LARGE (A) 08/01/2019 1834   Sepsis Labs Invalid input(s):  PROCALCITONIN,  WBC,  LACTICIDVEN Microbiology Recent Results (from the past 240 hour(s))  Culture, Urine     Status: Abnormal   Collection Time: 08/02/19  8:00 AM   Specimen: Urine, Clean Catch  Result Value Ref Range Status   Specimen Description URINE, CLEAN CATCH  Final   Special Requests NONE  Final   Culture (A)  Final    <10,000 COLONIES/mL INSIGNIFICANT GROWTH Performed at [facility], [address], [place], [place] [address]    Report Status 08/03/2019 FINAL  Final  SARS Coronavirus 2 [facility] order, Performed in [facility] hospital lab) Nasopharyngeal Nasopharyngeal Swab     Status: None   Collection Time: 08/07/19 10:23 AM   Specimen: Nasopharyngeal Swab  Result Value Ref Range Status   SARS Coronavirus 2 NEGATIVE NEGATIVE Final    Comment: (NOTE) If result is NEGATIVE SARS-CoV-2 target nucleic acids are NOT DETECTED. The SARS-CoV-2 RNA is generally detectable in upper and lower  respiratory specimens during the acute phase of infection. The lowest  concentration  of SARS-CoV-2 viral copies this assay can detect is 250  copies / mL. A negative result does not preclude SARS-CoV-2 infection  and should not be used as the sole basis for treatment or other  patient management decisions.  A negative result may occur with  improper specimen collection / handling, submission of specimen other  than nasopharyngeal swab, presence of viral mutation(s) within the  areas targeted by this assay, and inadequate number of viral copies  (<250 copies / mL). A negative result must be combined with clinical  observations, patient history, and epidemiological information. If result is POSITIVE SARS-CoV-2 target nucleic acids are DETECTED. The SARS-CoV-2 RNA is generally detectable in upper and lower  respiratory specimens dur ing the acute phase of infection.  Positive  results are indicative of active infection with SARS-CoV-2.  Clinical  correlation with patient history and  other diagnostic information is  necessary to determine patient infection status.  Positive results do  not rule out bacterial infection or co-infection with other viruses. If result is PRESUMPTIVE POSTIVE SARS-CoV-2 nucleic acids MAY BE PRESENT.   A presumptive positive result was obtained on the submitted specimen  and confirmed on repeat testing.  While 2019 novel coronavirus  (SARS-CoV-2) nucleic acids may be present in the submitted sample  additional confirmatory testing may be necessary for epidemiological  and / or clinical management purposes  to differentiate between  SARS-CoV-2 and other Sarbecovirus currently known to infect humans.  If clinically indicated additional testing with an alternate test  methodology [phone]) is advised. The SARS-CoV-2 RNA is generally  detectable in upper and lower respiratory sp ecimens during the acute  phase of infection. The expected result is Negative. Fact Sheet for Patients:  StrictlyIdeas.no Fact Sheet for Healthcare Providers: BankingDealers.co.za This test is not yet approved or cleared by the [place] FDA and has been authorized for detection and/or diagnosis of SARS-CoV-2 by FDA under an Emergency Use Authorization (EUA).  This EUA will remain in effect (meaning this test can be used) for the duration of the COVID-19 declaration under Section 564(b)(1) of the Act, 21 U.S.C. section 360bbb-3(b)(1), unless the authorization is terminated or revoked sooner. Performed at [facility], [place] [address], [place], [place] [address]      Time coordinating discharge: 35 minutes  SIGNED:   Cordelia Poche, MD [facility] 08/07/2019, 1:01 PM

## 2019-08-07 NOTE — TOC Transition Note (Addendum)
Transition of Care [facility]) - CM/SW Discharge Note   Patient Details  Name: Angelica Bell MRN: [id number] Date of Birth: 1932/11/23  Transition of Care [facility]) CM/SW Contact:  Gelene Mink, [place] Phone Number: 08/07/2019, 1:41 PM   Clinical Narrative:     Patient will DC to: [place] Anticipated DC date: 08/07/2019 Family notified: Yes Transport by: [employer] authorization is [phone]   Per MD patient ready for DC to . RN, patient, patient's family, and facility notified of DC. Discharge Summary and FL2 sent to facility. RN to call report prior to discharge [phone]). The patient will report to room 37.  DC packet on chart. Ambulance transport requested for patient.   CSW will sign off for now as social work intervention is no longer needed. Please consult [place] again if new needs arise.  Shontia Gillooly, LCSW-A [place]/Clinical Social Work Department Cell: [phone]    Final next level of care: [place] Barriers to Discharge: No Barriers Identified   Patient Goals and CMS Choice Patient states their goals for this hospitalization and ongoing recovery are:: Pt will go to [facility] for rehab CMS Medicare.gov Compare Post Acute Care list provided to:: Patient Represenative (must comment) Choice offered to / list presented to : [place] / [facility]  Discharge Placement   Existing PASRR number confirmed : 07/30/19          Patient [facility] bed at: [facility] Patient to be transferred to facility by: [place] Name of family member notified: [name] Patient and family notified of of transfer: 08/07/19  Discharge Plan and Services In-house Referral: Clinical Social Work   Post Acute Care Choice: [place]          DME Arranged: N/A DME Agency: NA       HH Arranged: NA HH Agency: NA        Social Determinants of Health (SDOH) Interventions     Readmission Risk Interventions No flowsheet data  found.

## 2019-08-31 ENCOUNTER — Inpatient Hospital Stay (HOSPITAL_COMMUNITY): Payer: Medicare Other

## 2019-08-31 ENCOUNTER — Inpatient Hospital Stay (HOSPITAL_COMMUNITY)
Admission: EM | Admit: 2019-08-31 | Discharge: 2019-09-15 | DRG: 070 | Disposition: A | Discharge status: Skilled Nursing Facility | Payer: Medicare Other | Attending: Infectious Disease | Admitting: Infectious Disease

## 2019-08-31 ENCOUNTER — Other Ambulatory Visit: Payer: Self-pay

## 2019-08-31 ENCOUNTER — Emergency Department (HOSPITAL_COMMUNITY): Payer: Medicare Other

## 2019-08-31 ENCOUNTER — Encounter (HOSPITAL_COMMUNITY): Payer: Medicare Other

## 2019-08-31 ENCOUNTER — Encounter (HOSPITAL_COMMUNITY): Payer: Self-pay | Admitting: Emergency Medicine

## 2019-08-31 DIAGNOSIS — M6282 Rhabdomyolysis: Secondary | ICD-10-CM | POA: Diagnosis present

## 2019-08-31 DIAGNOSIS — W19XXXA Unspecified fall, initial encounter: Secondary | ICD-10-CM | POA: Diagnosis present

## 2019-08-31 DIAGNOSIS — Z96641 Presence of right artificial hip joint: Secondary | ICD-10-CM | POA: Diagnosis present

## 2019-08-31 DIAGNOSIS — B965 Pseudomonas (aeruginosa) (mallei) (pseudomallei) as the cause of diseases classified elsewhere: Secondary | ICD-10-CM | POA: Diagnosis present

## 2019-08-31 DIAGNOSIS — I10 Essential (primary) hypertension: Secondary | ICD-10-CM | POA: Diagnosis present

## 2019-08-31 DIAGNOSIS — R579 Shock, unspecified: Secondary | ICD-10-CM

## 2019-08-31 DIAGNOSIS — N183 Chronic kidney disease, stage 3 (moderate): Secondary | ICD-10-CM | POA: Diagnosis present

## 2019-08-31 DIAGNOSIS — H9190 Unspecified hearing loss, unspecified ear: Secondary | ICD-10-CM | POA: Diagnosis present

## 2019-08-31 DIAGNOSIS — E041 Nontoxic single thyroid nodule: Secondary | ICD-10-CM | POA: Diagnosis present

## 2019-08-31 DIAGNOSIS — N179 Acute kidney failure, unspecified: Secondary | ICD-10-CM | POA: Diagnosis present

## 2019-08-31 DIAGNOSIS — K219 Gastro-esophageal reflux disease without esophagitis: Secondary | ICD-10-CM | POA: Diagnosis present

## 2019-08-31 DIAGNOSIS — R740 Nonspecific elevation of levels of transaminase and lactic acid dehydrogenase [LDH]: Secondary | ICD-10-CM | POA: Diagnosis present

## 2019-08-31 DIAGNOSIS — K449 Diaphragmatic hernia without obstruction or gangrene: Secondary | ICD-10-CM | POA: Diagnosis present

## 2019-08-31 DIAGNOSIS — Y92002 Bathroom of unspecified non-institutional (private) residence single-family (private) house as the place of occurrence of the external cause: Secondary | ICD-10-CM

## 2019-08-31 DIAGNOSIS — Z888 Allergy status to other drugs, medicaments and biological substances status: Secondary | ICD-10-CM

## 2019-08-31 DIAGNOSIS — M7989 Other specified soft tissue disorders: Secondary | ICD-10-CM

## 2019-08-31 DIAGNOSIS — G8928 Other chronic postprocedural pain: Secondary | ICD-10-CM | POA: Diagnosis not present

## 2019-08-31 DIAGNOSIS — Z8673 Personal history of transient ischemic attack (TIA), and cerebral infarction without residual deficits: Secondary | ICD-10-CM

## 2019-08-31 DIAGNOSIS — K5903 Drug induced constipation: Secondary | ICD-10-CM | POA: Diagnosis present

## 2019-08-31 DIAGNOSIS — F418 Other specified anxiety disorders: Secondary | ICD-10-CM | POA: Diagnosis present

## 2019-08-31 DIAGNOSIS — G629 Polyneuropathy, unspecified: Secondary | ICD-10-CM | POA: Diagnosis present

## 2019-08-31 DIAGNOSIS — M810 Age-related osteoporosis without current pathological fracture: Secondary | ICD-10-CM | POA: Diagnosis present

## 2019-08-31 DIAGNOSIS — G8929 Other chronic pain: Secondary | ICD-10-CM | POA: Diagnosis present

## 2019-08-31 DIAGNOSIS — Z86711 Personal history of pulmonary embolism: Secondary | ICD-10-CM

## 2019-08-31 DIAGNOSIS — Z881 Allergy status to other antibiotic agents status: Secondary | ICD-10-CM

## 2019-08-31 DIAGNOSIS — G4700 Insomnia, unspecified: Secondary | ICD-10-CM | POA: Diagnosis present

## 2019-08-31 DIAGNOSIS — R748 Abnormal levels of other serum enzymes: Secondary | ICD-10-CM

## 2019-08-31 DIAGNOSIS — Z7982 Long term (current) use of aspirin: Secondary | ICD-10-CM

## 2019-08-31 DIAGNOSIS — E785 Hyperlipidemia, unspecified: Secondary | ICD-10-CM | POA: Diagnosis present

## 2019-08-31 DIAGNOSIS — T40601A Poisoning by unspecified narcotics, accidental (unintentional), initial encounter: Secondary | ICD-10-CM

## 2019-08-31 DIAGNOSIS — Z87891 Personal history of nicotine dependence: Secondary | ICD-10-CM

## 2019-08-31 DIAGNOSIS — F341 Dysthymic disorder: Secondary | ICD-10-CM | POA: Diagnosis present

## 2019-08-31 DIAGNOSIS — E872 Acidosis, unspecified: Secondary | ICD-10-CM

## 2019-08-31 DIAGNOSIS — Z1623 Resistance to quinolones and fluoroquinolones: Secondary | ICD-10-CM | POA: Diagnosis present

## 2019-08-31 DIAGNOSIS — G47419 Narcolepsy without cataplexy: Secondary | ICD-10-CM | POA: Diagnosis present

## 2019-08-31 DIAGNOSIS — S32502A Unspecified fracture of left pubis, initial encounter for closed fracture: Secondary | ICD-10-CM | POA: Diagnosis present

## 2019-08-31 DIAGNOSIS — R4182 Altered mental status, unspecified: Secondary | ICD-10-CM

## 2019-08-31 DIAGNOSIS — R571 Hypovolemic shock: Secondary | ICD-10-CM | POA: Diagnosis present

## 2019-08-31 DIAGNOSIS — N63 Unspecified lump in unspecified breast: Secondary | ICD-10-CM

## 2019-08-31 DIAGNOSIS — Z20828 Contact with and (suspected) exposure to other viral communicable diseases: Secondary | ICD-10-CM | POA: Diagnosis present

## 2019-08-31 DIAGNOSIS — I129 Hypertensive chronic kidney disease with stage 1 through stage 4 chronic kidney disease, or unspecified chronic kidney disease: Secondary | ICD-10-CM | POA: Diagnosis present

## 2019-08-31 DIAGNOSIS — N390 Urinary tract infection, site not specified: Secondary | ICD-10-CM

## 2019-08-31 DIAGNOSIS — Z79899 Other long term (current) drug therapy: Secondary | ICD-10-CM

## 2019-08-31 DIAGNOSIS — Z882 Allergy status to sulfonamides status: Secondary | ICD-10-CM

## 2019-08-31 DIAGNOSIS — N644 Mastodynia: Secondary | ICD-10-CM | POA: Diagnosis present

## 2019-08-31 DIAGNOSIS — T40605A Adverse effect of unspecified narcotics, initial encounter: Secondary | ICD-10-CM | POA: Diagnosis present

## 2019-08-31 DIAGNOSIS — R945 Abnormal results of liver function studies: Secondary | ICD-10-CM | POA: Diagnosis not present

## 2019-08-31 DIAGNOSIS — E876 Hypokalemia: Secondary | ICD-10-CM | POA: Diagnosis present

## 2019-08-31 DIAGNOSIS — R609 Edema, unspecified: Secondary | ICD-10-CM | POA: Diagnosis not present

## 2019-08-31 DIAGNOSIS — N3000 Acute cystitis without hematuria: Secondary | ICD-10-CM | POA: Diagnosis not present

## 2019-08-31 DIAGNOSIS — G9341 Metabolic encephalopathy: Principal | ICD-10-CM | POA: Diagnosis present

## 2019-08-31 DIAGNOSIS — M79672 Pain in left foot: Secondary | ICD-10-CM | POA: Diagnosis not present

## 2019-08-31 DIAGNOSIS — Z90710 Acquired absence of both cervix and uterus: Secondary | ICD-10-CM

## 2019-08-31 DIAGNOSIS — R52 Pain, unspecified: Secondary | ICD-10-CM

## 2019-08-31 LAB — URINALYSIS, ROUTINE W REFLEX MICROSCOPIC
Bacteria, UA: NONE SEEN
Bilirubin Urine: NEGATIVE
Glucose, UA: NEGATIVE mg/dL
Ketones, ur: NEGATIVE mg/dL
Nitrite: NEGATIVE
Protein, ur: 30 mg/dL — AB
Specific Gravity, Urine: 1.013 (ref 1.005–1.030)
pH: 5 (ref 5.0–8.0)

## 2019-08-31 LAB — MRSA PCR SCREENING: MRSA by PCR: NEGATIVE

## 2019-08-31 LAB — CBC WITH DIFFERENTIAL/PLATELET
Abs Immature Granulocytes: 0.44 10*3/uL — ABNORMAL HIGH (ref 0.00–0.07)
Basophils Absolute: 0.1 10*3/uL (ref 0.0–0.1)
Basophils Relative: 0 %
Eosinophils Absolute: 0 10*3/uL (ref 0.0–0.5)
Eosinophils Relative: 0 %
HCT: 34.7 % — ABNORMAL LOW (ref 36.0–46.0)
Hemoglobin: 10.2 g/dL — ABNORMAL LOW (ref 12.0–15.0)
Immature Granulocytes: 2 %
Lymphocytes Relative: 5 %
Lymphs Abs: 1.5 10*3/uL (ref 0.7–4.0)
MCH: 28.8 pg (ref 26.0–34.0)
MCHC: 29.4 g/dL — ABNORMAL LOW (ref 30.0–36.0)
MCV: 98 fL (ref 80.0–100.0)
Monocytes Absolute: 1.9 10*3/uL — ABNORMAL HIGH (ref 0.1–1.0)
Monocytes Relative: 7 %
Neutro Abs: 25.9 10*3/uL — ABNORMAL HIGH (ref 1.7–7.7)
Neutrophils Relative %: 86 %
Platelets: 236 10*3/uL (ref 150–400)
RBC: 3.54 MIL/uL — ABNORMAL LOW (ref 3.87–5.11)
RDW: 13.6 % (ref 11.5–15.5)
WBC: 29.8 10*3/uL — ABNORMAL HIGH (ref 4.0–10.5)
nRBC: 0 % (ref 0.0–0.2)

## 2019-08-31 LAB — I-STAT CHEM 8, ED
BUN: 39 mg/dL — ABNORMAL HIGH (ref 8–23)
Calcium, Ion: 1.07 mmol/L — ABNORMAL LOW (ref 1.15–1.40)
Chloride: 104 mmol/L (ref 98–111)
Creatinine, Ser: 2.2 mg/dL — ABNORMAL HIGH (ref 0.44–1.00)
Glucose, Bld: 99 mg/dL (ref 70–99)
HCT: 43 % (ref 36.0–46.0)
Hemoglobin: 14.6 g/dL (ref 12.0–15.0)
Potassium: 5.1 mmol/L (ref 3.5–5.1)
Sodium: 137 mmol/L (ref 135–145)
TCO2: 23 mmol/L (ref 22–32)

## 2019-08-31 LAB — PHOSPHORUS: Phosphorus: 10.3 mg/dL — ABNORMAL HIGH (ref 2.5–4.6)

## 2019-08-31 LAB — PROTIME-INR
INR: 1.2 (ref 0.8–1.2)
Prothrombin Time: 15.1 seconds (ref 11.4–15.2)

## 2019-08-31 LAB — GLUCOSE, CAPILLARY
Glucose-Capillary: 152 mg/dL — ABNORMAL HIGH (ref 70–99)
Glucose-Capillary: 147 mg/dL — ABNORMAL HIGH (ref 70–99)

## 2019-08-31 LAB — SALICYLATE LEVEL: Salicylate Lvl: <7 mg/dL (ref 2.8–30.0)

## 2019-08-31 LAB — COMPREHENSIVE METABOLIC PANEL
ALT: 64 U/L — ABNORMAL HIGH (ref 0–44)
AST: 118 U/L — ABNORMAL HIGH (ref 15–41)
Albumin: 3.7 g/dL (ref 3.5–5.0)
Alkaline Phosphatase: 88 U/L (ref 38–126)
Anion gap: 18 — ABNORMAL HIGH (ref 5–15)
BUN: 27 mg/dL — ABNORMAL HIGH (ref 8–23)
CO2: 19 mmol/L — ABNORMAL LOW (ref 22–32)
Calcium: 8.9 mg/dL (ref 8.9–10.3)
Chloride: 103 mmol/L (ref 98–111)
Creatinine, Ser: 2.42 mg/dL — ABNORMAL HIGH (ref 0.44–1.00)
GFR calc Af Amer: 20 mL/min — ABNORMAL LOW (ref >60)
GFR calc non Af Amer: 18 mL/min — ABNORMAL LOW (ref >60)
Glucose, Bld: 108 mg/dL — ABNORMAL HIGH (ref 70–99)
Potassium: 5.2 mmol/L — ABNORMAL HIGH (ref 3.5–5.1)
Sodium: 140 mmol/L (ref 135–145)
Total Bilirubin: 0.5 mg/dL (ref 0.3–1.2)
Total Protein: 6.4 g/dL — ABNORMAL LOW (ref 6.5–8.1)

## 2019-08-31 LAB — RAPID URINE DRUG SCREEN, HOSP PERFORMED
Amphetamines: NOT DETECTED
Barbiturates: NOT DETECTED
Benzodiazepines: NOT DETECTED
Cocaine: NOT DETECTED
Opiates: POSITIVE — AB
Tetrahydrocannabinol: NOT DETECTED

## 2019-08-31 LAB — LACTIC ACID, PLASMA
Lactic Acid, Venous: 7.7 mmol/L (ref 0.5–1.9)
Lactic Acid, Venous: 3.5 mmol/L (ref 0.5–1.9)

## 2019-08-31 LAB — APTT: aPTT: 20 seconds — ABNORMAL LOW (ref 24–36)

## 2019-08-31 LAB — CK: Total CK: 3175 U/L — ABNORMAL HIGH (ref 38–234)

## 2019-08-31 LAB — ACETAMINOPHEN LEVEL: Acetaminophen (Tylenol), Serum: <10 ug/mL — ABNORMAL LOW (ref 10–30)

## 2019-08-31 LAB — SARS CORONAVIRUS 2 BY RT PCR (HOSPITAL ORDER, PERFORMED IN ~~LOC~~ HOSPITAL LAB): SARS Coronavirus 2: NEGATIVE

## 2019-08-31 LAB — PROCALCITONIN: Procalcitonin: 0.65 ng/mL

## 2019-08-31 LAB — MAGNESIUM: Magnesium: 2.3 mg/dL (ref 1.7–2.4)

## 2019-08-31 LAB — AMMONIA: Ammonia: 64 umol/L — ABNORMAL HIGH (ref 9–35)

## 2019-08-31 LAB — CORTISOL: Cortisol, Plasma: 63.6 ug/dL

## 2019-08-31 LAB — ETHANOL: Alcohol, Ethyl (B): <10 mg/dL (ref <10)

## 2019-08-31 MED ORDER — ONDANSETRON HCL 4 MG/2ML IJ SOLN
INTRAMUSCULAR | Status: AC
  Filled 2019-08-31: qty 2

## 2019-08-31 MED ORDER — NALOXONE HCL 2 MG/2ML IJ SOSY
PREFILLED_SYRINGE | INTRAMUSCULAR | Status: AC
  Filled 2019-08-31: qty 2

## 2019-08-31 MED ORDER — SODIUM CHLORIDE 0.9 % IV BOLUS
1000.0000 mL | Freq: Once | INTRAVENOUS | Status: AC
  Administered 2019-08-31: 1000 mL via INTRAVENOUS

## 2019-08-31 MED ORDER — SODIUM CHLORIDE 0.9 % IV BOLUS (SEPSIS)
250.0000 mL | Freq: Once | INTRAVENOUS | Status: AC
  Administered 2019-08-31: 09:00:00 250 mL via INTRAVENOUS

## 2019-08-31 MED ORDER — BISACODYL 10 MG RE SUPP: 10.0000 mg | Freq: Once | RECTAL | Status: DC

## 2019-08-31 MED ORDER — PANTOPRAZOLE SODIUM 40 MG PO TBEC
40.0000 mg | DELAYED_RELEASE_TABLET | Freq: Every day | ORAL | Status: DC
  Administered 2019-09-01 – 2019-09-15 (×15): 40 mg via ORAL
  Filled 2019-08-31 (×15): qty 1

## 2019-08-31 MED ORDER — VANCOMYCIN HCL IN DEXTROSE 1-5 GM/200ML-% IV SOLN
1000.0000 mg | Freq: Once | INTRAVENOUS | Status: AC
  Administered 2019-08-31: 12:00:00 1000 mg via INTRAVENOUS
  Filled 2019-08-31: qty 200

## 2019-08-31 MED ORDER — POLYETHYLENE GLYCOL 3350 17 G PO PACK: 17.0000 g | PACK | Freq: Every day | ORAL | Status: DC

## 2019-08-31 MED ORDER — SODIUM CHLORIDE 0.9 % IV SOLN
2.0000 g | INTRAVENOUS | Status: AC
  Administered 2019-09-01 – 2019-09-07 (×7): 2 g via INTRAVENOUS
  Filled 2019-08-31 (×7): qty 2

## 2019-08-31 MED ORDER — SODIUM CHLORIDE 0.9 % IV SOLN
2.0000 g | Freq: Once | INTRAVENOUS | Status: AC
  Administered 2019-08-31: 09:00:00 2 g via INTRAVENOUS
  Filled 2019-08-31: qty 2

## 2019-08-31 MED ORDER — VANCOMYCIN VARIABLE DOSE PER UNSTABLE RENAL FUNCTION (PHARMACIST DOSING): Status: DC

## 2019-08-31 MED ORDER — ONDANSETRON HCL 4 MG/2ML IJ SOLN
4.0000 mg | Freq: Once | INTRAMUSCULAR | Status: AC
  Administered 2019-08-31: 4 mg via INTRAVENOUS

## 2019-08-31 MED ORDER — LACTULOSE 10 GM/15ML PO SOLN
30.0000 g | Freq: Three times a day (TID) | ORAL | Status: DC
  Administered 2019-09-01 (×2): 30 g
  Filled 2019-08-31 (×4): qty 45

## 2019-08-31 MED ORDER — NALOXONE HCL 2 MG/2ML IJ SOSY
1.0000 mg | PREFILLED_SYRINGE | Freq: Once | INTRAMUSCULAR | Status: AC
  Administered 2019-08-31: 08:00:00 1 mg via INTRAVENOUS

## 2019-08-31 MED ORDER — SODIUM CHLORIDE 0.9 % IV BOLUS (SEPSIS)
1000.0000 mL | Freq: Once | INTRAVENOUS | Status: AC
  Administered 2019-08-31: 09:00:00 1000 mL via INTRAVENOUS

## 2019-08-31 MED ORDER — ONDANSETRON HCL 4 MG/2ML IJ SOLN
4.0000 mg | Freq: Four times a day (QID) | INTRAMUSCULAR | Status: DC | PRN
  Administered 2019-09-10: 11:00:00 4 mg via INTRAVENOUS
  Filled 2019-08-31 (×2): qty 2

## 2019-08-31 MED ORDER — NOREPINEPHRINE 4 MG/250ML-% IV SOLN
0.0000 ug/min | INTRAVENOUS | Status: DC
  Administered 2019-08-31: 7.5 ug/min via INTRAVENOUS

## 2019-08-31 MED ORDER — CHLORHEXIDINE GLUCONATE CLOTH 2 % EX PADS
6.0000 | MEDICATED_PAD | Freq: Every day | CUTANEOUS | Status: DC
  Administered 2019-08-31 – 2019-09-15 (×16): 6 via TOPICAL

## 2019-08-31 MED ORDER — DOCUSATE SODIUM 100 MG PO CAPS
100.0000 mg | ORAL_CAPSULE | Freq: Two times a day (BID) | ORAL | Status: DC
  Administered 2019-09-01 – 2019-09-05 (×6): 100 mg via ORAL
  Filled 2019-08-31 (×8): qty 1

## 2019-08-31 MED ORDER — NALOXONE HCL 2 MG/2ML IJ SOSY
PREFILLED_SYRINGE | INTRAMUSCULAR | Status: AC
  Administered 2019-08-31: 10:00:00 1 mg
  Filled 2019-08-31: qty 2

## 2019-08-31 MED ORDER — SODIUM CHLORIDE 0.9 % IV BOLUS (SEPSIS)
500.0000 mL | Freq: Once | INTRAVENOUS | Status: AC
  Administered 2019-08-31: 500 mL via INTRAVENOUS

## 2019-08-31 MED ORDER — NOREPINEPHRINE 4 MG/250ML-% IV SOLN
INTRAVENOUS | Status: AC
  Filled 2019-08-31: qty 250

## 2019-08-31 MED ORDER — ACETAMINOPHEN 325 MG PO TABS: 650.0000 mg | ORAL_TABLET | ORAL | Status: DC | PRN

## 2019-08-31 MED ORDER — METRONIDAZOLE IN NACL 5-0.79 MG/ML-% IV SOLN
500.0000 mg | Freq: Once | INTRAVENOUS | Status: AC
  Administered 2019-08-31: 500 mg via INTRAVENOUS
  Filled 2019-08-31: qty 100

## 2019-08-31 MED ORDER — NALOXONE HCL 2 MG/2ML IJ SOSY
1.0000 mg | PREFILLED_SYRINGE | Freq: Once | INTRAMUSCULAR | Status: AC
  Administered 2019-08-31: 1 mg via INTRAVENOUS

## 2019-08-31 MED ORDER — NALOXONE HCL 4 MG/10ML IJ SOLN
0.2500 mg/h | INTRAVENOUS | Status: DC
  Administered 2019-08-31: 0.25 mg/h via INTRAVENOUS
  Filled 2019-08-31: qty 10

## 2019-08-31 MED ORDER — HEPARIN SODIUM (PORCINE) 5000 UNIT/ML IJ SOLN
5000.0000 [IU] | Freq: Three times a day (TID) | INTRAMUSCULAR | Status: DC
  Administered 2019-08-31 – 2019-09-15 (×43): 5000 [IU] via SUBCUTANEOUS
  Filled 2019-08-31 (×43): qty 1

## 2019-08-31 MED ORDER — IPRATROPIUM-ALBUTEROL 0.5-2.5 (3) MG/3ML IN SOLN: 3.0000 mL | Freq: Four times a day (QID) | RESPIRATORY_TRACT | Status: DC | PRN

## 2019-08-31 MED ORDER — ONDANSETRON HCL 4 MG/2ML IJ SOLN
4.0000 mg | Freq: Once | INTRAMUSCULAR | Status: AC
  Administered 2019-08-31: 10:00:00 4 mg via INTRAVENOUS

## 2019-08-31 MED ORDER — LACTATED RINGERS IV SOLN
INTRAVENOUS | Status: DC
  Administered 2019-08-31 – 2019-09-02 (×4): via INTRAVENOUS

## 2019-08-31 NOTE — ED Notes (Signed)
Levophed started, stopped with in 2-3 minutes -- bP is now 136/76

## 2019-08-31 NOTE — Progress Notes (Signed)
Pharmacy Antibiotic Note  Angelica Bell is a 85 y.o. female admitted on 08/31/2019 with sepsis.  Pharmacy has been consulted for vancomycin and cefepime dosing. Pt is slightly hypothermic and WBC is significantly elevated at 29.8. Scr is well above baseline at 2.42. Lactic acid is also significantly elevated.   Plan: Vancomycin 1 g IV x 1 then trend Scr for further doses Cefepime 2gm IV Q24H F/u renal fxn, C&S, clinical status an peak/trough at SS  Height: 5\' 6"  (167.6 cm) Weight: 121 lb 4.1 oz (55 kg) IBW/kg (Calculated) : 59.3  Temp (24hrs), Avg:96.5 F (35.8 C), Min:96.5 F (35.8 C), Max:96.5 F (35.8 C)  Recent Labs  Lab 08/31/19 0739 08/31/19 0741 08/31/19 0753 08/31/19 0841  WBC  --   --   --  29.8*  CREATININE  --  2.42* 2.20*  --   LATICACIDVEN 7.7*  --   --   --     Estimated Creatinine Clearance: 16.2 mL/min (A) (by C-G formula based on SCr of 2.2 mg/dL (H)).    Allergies  Allergen Reactions  . Hctz [Hydrochlorothiazide]     Significant and rapid hyponatremia (TIH)  . Amoxicillin-Pot Clavulanate Hives and Diarrhea    Has patient had a PCN reaction causing immediate rash, facial/tongue/throat swelling, SOB or lightheadedness with hypotension: yes Has patient had a PCN reaction causing severe rash involving mucus membranes or skin necrosis: yes Has patient had a PCN reaction that required hospitalization : unknown Has patient had a PCN reaction occurring within the last 10 years: yes If all of the above answers are "NO", then may proceed with Cephalosporin use.   Marland Kitchen Lisinopril-Hydrochlorothiazide Other (See Comments)    Tired- no energy to do anything..  . Sulfa Antibiotics Hives and Nausea And Vomiting  . Sulfonamide Derivatives Hives    Antimicrobials this admission: Vanc 9/1>> Cefepime 9/1>> Flagyl x 1 9/1  Dose adjustments this admission: N/A  Microbiology results: Pending  Thank you for allowing pharmacy to be a part of this patient's  care.  Lulie Hurd, Rande Lawman 08/31/2019 7:50 AM

## 2019-08-31 NOTE — ED Triage Notes (Signed)
To ED via [facility] from home -- found unresponsive by son this am-- he last talked with her at 22:30 and she was normal per son. Recent discharge from [facility] after rehab from  [place]-  On arrival pt is diaphoretic, pale, nonverbal, not responsive to pain but does moan with movement.  Has reddened areas to right breast, left clavicle, knees. Right leg is internally rotated and shortened. Strong pedal pulse present.

## 2019-08-31 NOTE — ED Notes (Signed)
BP low at this p[oint-- Levophed restarted-- 7.5 mcg/m

## 2019-08-31 NOTE — ED Provider Notes (Signed)
[facility] EMERGENCY DEPARTMENT Provider Note   CSN: [id number] Arrival date & time: 08/31/19  0719     History   Chief Complaint Chief Complaint  Patient presents with  . Altered Mental Status    HPI Angelica Bell is a 85 y.o. female.     HPI   85 year old female with a history of hypertension, possible rheumatoid arthritis, osteoporosis, scoliosis, chronic back pain, history of remote pulmonary embolus, recent admission with concern for acute stroke with left upper extremity deficits, UTI, acute renal failure in the setting of rhabdomyolysis, elevated LFTs, who was discharged on August 8 to a rehab facility, and has since returned home and is living alone, who presents with concern for altered mental status and hypotension.  History is limited by patient's altered mental status.  Per EMS, patient lives alone, and son had seen her yesterday evening, and talk to her again at 1045 last night and she had seen normal.  He had gone into check on her this morning and bring her breakfast, and found her altered, laying on the bed.  Per EMS she was in an abnormal position with neck turned to the side, crumpled with leg slightly off the bed.  She was hypoxic on their arrival, and also had hypotension.  They initiated an IV, fluids and oxygen with improvement.  Past Medical History:  Diagnosis Date  . Chronic back pain    with degenerative joint disease  . Chronic pain disorder    with narcotic management  . Dry eyes   . Hypertension   . Osteoporosis   . PE (pulmonary embolism)    After hysterectomy in 1967  . Pneumonia     Patient Active Problem List   Diagnosis Date Noted  . Shock ([place]) 08/31/2019  . Acute cystitis 08/03/2019  . Confusion and disorientation 08/03/2019  . Cerebrovascular accident (CVA) ([place])   . ARF (acute renal failure) ([place]) 07/26/2019  . Hyponatremia 07/26/2019  . Abnormal liver function 07/26/2019  . Peripheral neuropathy   . Drug  overdose   . Polypharmacy   . Altered mental status 10/25/2017  . Closed displaced intertrochanteric fracture of left femur ([place]) 02/16/2017  . Hip fracture, right ([place]) 12/25/2015  . Chronic pain 12/24/2015  . Fracture of femoral neck, right, closed ([place]) 12/24/2015  . Essential hypertension 12/24/2015  . Community acquired pneumonia 04/15/2013  . Hypokalemia 04/15/2013  . Labial cyst 02/08/2012  . POSTMENOPAUSAL SYNDROME 11/28/2009  . PERIPHERAL NEUROPATHY, LOWER EXTREMITY, RIGHT 02/22/2009  . INSOMNIA-SLEEP DISORDER-UNSPEC 10/12/2008  . Lumbago 08/02/2008  . 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 ANXIETY DEPRESSION 12/16/2007  . HYPERLIPIDEMIA 07/08/2007  . Osteoarthritis 07/08/2007  . Osteoporosis 07/08/2007  . SCOLIOSIS NEC 07/08/2007    Past Surgical History:  Procedure Laterality Date  . ABDOMINAL HYSTERECTOMY    . APPENDECTOMY    . BREAST ENHANCEMENT SURGERY    . CATARACT EXTRACTION    . FEMUR IM NAIL Left 02/17/2017   Procedure: INTRAMEDULLARY (IM) NAIL FEMORAL;  Surgeon: Nicholes Stairs, MD;  Location: [facility];  Service: Orthopedics;  Laterality: Left;  . HIP ARTHROPLASTY Right 12/24/2015   Procedure: ARTHROPLASTY BIPOLAR HIP (HEMIARTHROPLASTY);  Surgeon: Netta Cedars, MD;  Location: [facility];  Service: Orthopedics;  Laterality: Right;  . TONSILLECTOMY       OB History   No obstetric history on file.      Home Medications    Prior to Admission medications   Medication Sig Start Date End Date Taking?  Authorizing Provider  aspirin EC 81 MG tablet Take 1 tablet (81 mg total) by mouth 2 (two) times daily. Patient taking differently: Take 81 mg by mouth daily.  02/19/17  Yes Bonnielee Haff, MD  atorvastatin (LIPITOR) 10 MG tablet Take 1 tablet (10 mg total) by mouth daily at 6 PM. 08/07/19  Yes Mariel Aloe, MD  escitalopram (LEXAPRO) 20 MG tablet Take 1 tablet (20 mg total) by mouth daily. 08/08/19  Yes Mariel Aloe, MD  Menthol-Methyl Salicylate  (MUSCLE RUB) 10-15 % CREA Apply 1 application topically as needed for muscle pain. 08/07/19  Yes Mariel Aloe, MD  mirabegron ER (MYRBETRIQ) 25 MG TB24 tablet Take 1 tablet (25 mg total) by mouth daily. 08/08/19  Yes Mariel Aloe, MD  oxycodone (ROXICODONE) 30 MG immediate release tablet Take 1 tablet (30 mg total) by mouth 3 (three) times daily as needed for pain. 08/07/19  Yes Mariel Aloe, MD    Family History Family History  Problem Relation Age of Onset  . Heart Problems Mother     Social History Social History   Tobacco Use  . Smoking status: Former Smoker    Years: 13.00    Quit date: 04/15/1976    Years since quitting: 43.4  . Smokeless tobacco: Never Used  Substance Use Topics  . Alcohol use: No  . Drug use: No     Allergies   Hctz [hydrochlorothiazide], Amoxicillin-pot clavulanate, Lisinopril-hydrochlorothiazide, Sulfa antibiotics, and Sulfonamide derivatives   Review of Systems Review of Systems  Unable to perform ROS: Mental status change     Physical Exam Updated Vital Signs BP (!) 86/46   Pulse 70   Temp 98.2 F (36.8 C) (Oral)   Resp 13   Ht 5\' 6"  (1.676 m)   Wt 55 kg   SpO2 100%   BMI 19.57 kg/m   Physical Exam Vitals signs and nursing note reviewed.  Constitutional:      General: She is not in acute distress.    Appearance: She is well-developed. She is not diaphoretic.  HENT:     Head: Normocephalic and atraumatic.  Eyes:     Conjunctiva/sclera: Conjunctivae normal.  Neck:     Musculoskeletal: Normal range of motion.  Cardiovascular:     Rate and Rhythm: Normal rate and regular rhythm.     Heart sounds: Normal heart sounds. No murmur. No friction rub. No gallop.      Comments: Erythema right side of chest Pulmonary:     Effort: Pulmonary effort is normal. No respiratory distress.     Breath sounds: Normal breath sounds. No wheezing or rales.     Comments: Poor effort Abdominal:     General: There is no distension.      Palpations: Abdomen is soft.     Tenderness: There is abdominal tenderness. There is no guarding.     Comments: Contusion  Musculoskeletal:        General: No tenderness.     Comments: Right LE appears shorter, however no pain with ROM Left greater than right peripheral edema  Skin:    General: Skin is warm and dry.     Findings: No erythema or rash.  Neurological:     Mental Status: She is lethargic.     GCS: GCS eye subscore is 3. GCS verbal subscore is 4. GCS motor subscore is 6.     Comments: Prior to narcan verbal GCS 2 Unable to complete full Neurologic assessment given altered mental status  Able to squeeze both hands, raise right arm to face  Face appears symmetric Pupils abnormal shape bilaterally       ED Treatments / Results  Labs (all labs ordered are listed, but only abnormal results are displayed) Labs Reviewed  LACTIC ACID, PLASMA - Abnormal; Notable for the following components:      Result Value   Lactic Acid, Venous 7.7 (*)    All other components within normal limits  LACTIC ACID, PLASMA - Abnormal; Notable for the following components:   Lactic Acid, Venous 3.5 (*)    All other components within normal limits  COMPREHENSIVE METABOLIC PANEL - Abnormal; Notable for the following components:   Potassium 5.2 (*)    CO2 19 (*)    Glucose, Bld 108 (*)    BUN 27 (*)    Creatinine, Ser 2.42 (*)    Total Protein 6.4 (*)    AST 118 (*)    ALT 64 (*)    GFR calc non Af Amer 18 (*)    GFR calc Af Amer 20 (*)    Anion gap 18 (*)    All other components within normal limits  APTT - Abnormal; Notable for the following components:   aPTT 20 (*)    All other components within normal limits  URINALYSIS, ROUTINE W REFLEX MICROSCOPIC - Abnormal; Notable for the following components:   APPearance HAZY (*)    Hgb urine dipstick LARGE (*)    Protein, ur 30 (*)    Leukocytes,Ua SMALL (*)    All other components within normal limits  AMMONIA - Abnormal; Notable for  the following components:   Ammonia 64 (*)    All other components within normal limits  CK - Abnormal; Notable for the following components:   Total CK 3,175 (*)    All other components within normal limits  PHOSPHORUS - Abnormal; Notable for the following components:   Phosphorus 10.3 (*)    All other components within normal limits  CBC WITH DIFFERENTIAL/PLATELET - Abnormal; Notable for the following components:   WBC 29.8 (*)    RBC 3.54 (*)    Hemoglobin 10.2 (*)    HCT 34.7 (*)    MCHC 29.4 (*)    Neutro Abs 25.9 (*)    Monocytes Absolute 1.9 (*)    Abs Immature Granulocytes 0.44 (*)    All other components within normal limits  ACETAMINOPHEN LEVEL - Abnormal; Notable for the following components:   Acetaminophen (Tylenol), Serum <10 (*)    All other components within normal limits  GLUCOSE, CAPILLARY - Abnormal; Notable for the following components:   Glucose-Capillary 152 (*)    All other components within normal limits  GLUCOSE, CAPILLARY - Abnormal; Notable for the following components:   Glucose-Capillary 147 (*)    All other components within normal limits  I-STAT CHEM 8, ED - Abnormal; Notable for the following components:   BUN 39 (*)    Creatinine, Ser 2.20 (*)    Calcium, Ion 1.07 (*)    All other components within normal limits  SARS CORONAVIRUS 2 (HOSPITAL ORDER, [facility])  MRSA PCR SCREENING  CULTURE, BLOOD (ROUTINE X 2)  CULTURE, BLOOD (ROUTINE X 2)  URINE CULTURE  PROTIME-INR  MAGNESIUM  ETHANOL  SALICYLATE LEVEL  PROCALCITONIN  CORTISOL  CBC WITH DIFFERENTIAL/PLATELET  RAPID URINE DRUG SCREEN, HOSP PERFORMED  MYOGLOBIN, URINE  CK TOTAL AND CKMB (NOT AT [facility])  CBC  BASIC METABOLIC PANEL  MAGNESIUM  PHOSPHORUS  HEPATIC FUNCTION PANEL    EKG EKG Interpretation  Date/Time:  Tuesday August 31 2019 07:27:22 EDT Ventricular Rate:  93 PR Interval:    QRS Duration: 97 QT Interval:  364 QTC Calculation: 453 R  Axis:   47 Text Interpretation:  Sinus rhythm Probable left atrial enlargement Low voltage, precordial leads Abnormal R-wave progression, early transition No significant change since last tracing Confirmed by Gareth Morgan [phone]) on 08/31/2019 8:13:07 AM   Radiology Ct Abdomen Pelvis Wo Contrast  Result Date: 08/31/2019 CLINICAL DATA:  Rib pain, possible acute mesenteric ischemia. EXAM: CT CHEST, ABDOMEN AND PELVIS WITHOUT CONTRAST TECHNIQUE: Multidetector CT imaging of the chest, abdomen and pelvis was performed following the standard protocol without IV contrast. COMPARISON:  None. FINDINGS: CT CHEST FINDINGS Cardiovascular: Atherosclerosis of thoracic aorta is noted without aneurysm formation. Dissection cannot be excluded due to the lack of intravenous contrast. Normal cardiac size. No pericardial effusion is noted. Mild coronary artery calcifications are noted. Mediastinum/Nodes: Large sliding-type hiatal hernia is noted. 16 mm left thyroid nodule is noted. No adenopathy is noted. Lungs/Pleura: Lungs are clear. No pleural effusion or pneumothorax. Musculoskeletal: No chest wall mass or suspicious bone lesions identified. CT ABDOMEN PELVIS FINDINGS Hepatobiliary: No focal liver abnormality is seen. No gallstones, gallbladder wall thickening, or biliary dilatation. Pancreas: Unremarkable. No pancreatic ductal dilatation or surrounding inflammatory changes. Spleen: Normal in size without focal abnormality. Adrenals/Urinary Tract: Adrenal glands and kidneys appear normal. No hydronephrosis or renal obstruction is noted. No renal or ureteral calculi are noted. Evaluation of the bladder is severely limited due to scatter artifact arising from bilateral hip surgical hardware. Stomach/Bowel: The stomach is unremarkable. There is no evidence of bowel obstruction or inflammation. The appendix is not clearly visualized. Large amount of stool is seen in the sigmoid colon and rectum concerning for possible  impaction. Vascular/Lymphatic: Aortic atherosclerosis. No enlarged abdominal or pelvic lymph nodes. Due to the lack of intravenous contrast, mesenteric ischemia cannot be excluded on the basis of this exam. Reproductive: Status post hysterectomy. No adnexal masses. Other: No abdominal wall hernia or abnormality. No abdominopelvic ascites. Musculoskeletal: Status post right hip arthroplasty. Status post surgical repair of proximal left femoral fracture. No acute osseous abnormality is noted. IMPRESSION: Large sliding-type hiatal hernia. 1.6 cm left thyroid nodule is noted. Thyroid ultrasound is recommended for further evaluation. Large amount of stool is seen in sigmoid colon and rectum concerning for possible impaction. No definite acute abnormality is noted in the chest, abdomen or pelvis. Due to the lack of intravenous contrast, the patency of mesenteric vessels could not be assessed on this study. Aortic Atherosclerosis (ICD10-I70.0). Electronically Signed   By: Marijo Conception M.D.   On: 08/31/2019 10:43   Ct Head Wo Contrast  Result Date: 08/31/2019 CLINICAL DATA:  Altered level of consciousness EXAM: CT HEAD WITHOUT CONTRAST TECHNIQUE: Contiguous axial images were obtained from the base of the skull through the vertex without intravenous contrast. COMPARISON:  Multiple exams, including brain MRI dated 07/27/2019 FINDINGS: Brain: Remote right cerebellar infarct, image 8/3. Hypodensity in the left occipital white matter on image 17/3 corresponding to the region of at the time subacute infarct shown on MRI from 07/27/2019. 1.1 by 0.6 cm prior small infarct in the right parietal corona radiata corresponding to the at the time subacute infarct shown on 07/27/2019 MRI. Periventricular white matter and corona radiata hypodensities favor chronic ischemic microvascular white matter disease. Otherwise, the brainstem, cerebellum, cerebral peduncles, thalamus, basal ganglia, basilar cisterns,  and ventricular system  appear within normal limits. No intracranial hemorrhage, mass lesion, or acute CVA. Vascular: There is atherosclerotic calcification of the cavernous carotid arteries bilaterally. Vertebral artery atherosclerotic calcification. Skull: Unremarkable Sinuses/Orbits: Mild chronic left sphenoid, left maxillary, and ethmoid sinusitis. Other: No supplemental non-categorized findings. IMPRESSION: 1. Evolutionary findings in the small white matter infarcts in the left occipital lobe right parietal lobe, currently hypodense indicating increased chronicity. Both of these were subacute at the time of prior brain MRI of 07/27/2019. 2. Old right cerebellar infarct. 3. Periventricular white matter and corona radiata hypodensities favor chronic ischemic microvascular white matter disease. 4. No acute findings. 5. Atherosclerosis. 6. Mild chronic paranasal sinusitis. Electronically Signed   By: Van Clines M.D.   On: 08/31/2019 08:48   Ct Chest Wo Contrast  Result Date: 08/31/2019 CLINICAL DATA:  Rib pain, possible acute mesenteric ischemia. EXAM: CT CHEST, ABDOMEN AND PELVIS WITHOUT CONTRAST TECHNIQUE: Multidetector CT imaging of the chest, abdomen and pelvis was performed following the standard protocol without IV contrast. COMPARISON:  None. FINDINGS: CT CHEST FINDINGS Cardiovascular: Atherosclerosis of thoracic aorta is noted without aneurysm formation. Dissection cannot be excluded due to the lack of intravenous contrast. Normal cardiac size. No pericardial effusion is noted. Mild coronary artery calcifications are noted. Mediastinum/Nodes: Large sliding-type hiatal hernia is noted. 16 mm left thyroid nodule is noted. No adenopathy is noted. Lungs/Pleura: Lungs are clear. No pleural effusion or pneumothorax. Musculoskeletal: No chest wall mass or suspicious bone lesions identified. CT ABDOMEN PELVIS FINDINGS Hepatobiliary: No focal liver abnormality is seen. No gallstones, gallbladder wall thickening, or biliary  dilatation. Pancreas: Unremarkable. No pancreatic ductal dilatation or surrounding inflammatory changes. Spleen: Normal in size without focal abnormality. Adrenals/Urinary Tract: Adrenal glands and kidneys appear normal. No hydronephrosis or renal obstruction is noted. No renal or ureteral calculi are noted. Evaluation of the bladder is severely limited due to scatter artifact arising from bilateral hip surgical hardware. Stomach/Bowel: The stomach is unremarkable. There is no evidence of bowel obstruction or inflammation. The appendix is not clearly visualized. Large amount of stool is seen in the sigmoid colon and rectum concerning for possible impaction. Vascular/Lymphatic: Aortic atherosclerosis. No enlarged abdominal or pelvic lymph nodes. Due to the lack of intravenous contrast, mesenteric ischemia cannot be excluded on the basis of this exam. Reproductive: Status post hysterectomy. No adnexal masses. Other: No abdominal wall hernia or abnormality. No abdominopelvic ascites. Musculoskeletal: Status post right hip arthroplasty. Status post surgical repair of proximal left femoral fracture. No acute osseous abnormality is noted. IMPRESSION: Large sliding-type hiatal hernia. 1.6 cm left thyroid nodule is noted. Thyroid ultrasound is recommended for further evaluation. Large amount of stool is seen in sigmoid colon and rectum concerning for possible impaction. No definite acute abnormality is noted in the chest, abdomen or pelvis. Due to the lack of intravenous contrast, the patency of mesenteric vessels could not be assessed on this study. Aortic Atherosclerosis (ICD10-I70.0). Electronically Signed   By: Marijo Conception M.D.   On: 08/31/2019 10:43   Ct Cervical Spine Wo Contrast  Result Date: 08/31/2019 CLINICAL DATA:  85 year old female with history of altered mental status. EXAM: CT CERVICAL SPINE WITHOUT CONTRAST TECHNIQUE: Multidetector CT imaging of the cervical spine was performed without intravenous  contrast. Multiplanar CT image reconstructions were also generated. COMPARISON:  Cervical spine CT 07/29/2017. FINDINGS: Alignment: Normal. Skull base and vertebrae: No acute fracture. No primary bone lesion or focal pathologic process. Soft tissues and spinal canal: No prevertebral fluid or swelling. No  visible canal hematoma. Disc levels: Multilevel degenerative disc disease, most severe at C4-C5, C5-C6 and C6-C7. Mild multilevel facet arthropathy. Upper chest: Unremarkable. Other: Mild mucosal thickening in the left sphenoid sinus. IMPRESSION: 1. No acute radiographic abnormality of the cervical spine. 2. Multilevel degenerative disc disease and cervical spondylosis, as above. Electronically Signed   By: Vinnie Langton M.D.   On: 08/31/2019 08:48   Dg Pelvis Portable  Result Date: 08/31/2019 CLINICAL DATA:  Found unresponsive EXAM: PORTABLE PELVIS 1-2 VIEWS COMPARISON:  07/25/2019 FINDINGS: Pelvic ring is intact. Prior right hip replacement and surgical fixation of the proximal left femur are seen and stable. No fracture or hardware failure is noted. Degenerative changes of lumbar spine are seen. IMPRESSION: Postsurgical changes without acute abnormality. Electronically Signed   By: Inez Catalina M.D.   On: 08/31/2019 08:18   Dg Chest Portable 1 View  Result Date: 08/31/2019 CLINICAL DATA:  Found unresponsive EXAM: PORTABLE CHEST 1 VIEW COMPARISON:  07/26/2019 FINDINGS: Cardiac shadow is mildly prominent but accentuated by the portable technique. Aortic calcifications are again noted and stable. The lungs are well aerated bilaterally. Very mild increased density is noted in the left base likely related atelectasis although the possibility of aspiration cannot be totally excluded. Hiatal hernia is again noted and stable. Old left humeral fracture is noted. IMPRESSION: Mild increased density in the left base which may represent atelectasis or possible aspiration. Electronically Signed   By: Inez Catalina M.D.    On: 08/31/2019 08:17   Vas [place] Lower Extremity Venous (dvt) (only [facility])  Result Date: 08/31/2019  Lower Venous Study Indications: Swelling, and Edema.  Limitations: Poor ultrasound/tissue interface. Comparison Study: no prior Performing Technologist: Abram Sander RVS  Examination Guidelines: A complete evaluation includes B-mode imaging, spectral Doppler, color Doppler, and power Doppler as needed of all accessible portions of each vessel. Bilateral testing is considered an integral part of a complete examination. Limited examinations for reoccurring indications may be performed as noted.  +---------+---------------+---------+-----------+----------+--------------+ RIGHT    CompressibilityPhasicitySpontaneityPropertiesThrombus Aging +---------+---------------+---------+-----------+----------+--------------+ CFV      Full           Yes      Yes                                 +---------+---------------+---------+-----------+----------+--------------+ SFJ      Full                                                        +---------+---------------+---------+-----------+----------+--------------+ FV Prox  Full                                                        +---------+---------------+---------+-----------+----------+--------------+ FV Mid   Full                                                        +---------+---------------+---------+-----------+----------+--------------+ FV DistalFull                                                        +---------+---------------+---------+-----------+----------+--------------+  PFV      Full                                                        +---------+---------------+---------+-----------+----------+--------------+ POP      Full           Yes      Yes                                 +---------+---------------+---------+-----------+----------+--------------+ PTV                                                    Not visualized +---------+---------------+---------+-----------+----------+--------------+ PERO                                                  Not visualized +---------+---------------+---------+-----------+----------+--------------+   +---------+---------------+---------+-----------+----------+------------------+ LEFT     CompressibilityPhasicitySpontaneityPropertiesThrombus Aging     +---------+---------------+---------+-----------+----------+------------------+ CFV      Full           Yes      Yes                                     +---------+---------------+---------+-----------+----------+------------------+ SFJ      Full                                                            +---------+---------------+---------+-----------+----------+------------------+ FV Prox  Full                                                            +---------+---------------+---------+-----------+----------+------------------+ FV Mid                  Yes      Yes                  limited                                                                  visualization      +---------+---------------+---------+-----------+----------+------------------+ FV Distal               Yes      Yes                  limited  visualization      +---------+---------------+---------+-----------+----------+------------------+ PFV      Full                                                            +---------+---------------+---------+-----------+----------+------------------+ POP      Full           Yes      Yes                                     +---------+---------------+---------+-----------+----------+------------------+ PTV                                                   Not visualized     +---------+---------------+---------+-----------+----------+------------------+ PERO                                                   Not visualized     +---------+---------------+---------+-----------+----------+------------------+     Summary: Right: There is no evidence of deep vein thrombosis in the lower extremity. No cystic structure found in the popliteal fossa. Left: There is no evidence of deep vein thrombosis in the lower extremity. However, portions of this examination were limited- see technologist comments above. No cystic structure found in the popliteal fossa.  *See table(s) above for measurements and observations.    Preliminary     Procedures .Critical Care Performed by: Gareth Morgan, MD Authorized by: Gareth Morgan, MD   Critical care provider statement:    Critical care time (minutes):  90   Critical care was time spent personally by me on the following activities:  Discussions with consultants, evaluation of patient's response to treatment, examination of patient, ordering and performing treatments and interventions, ordering and review of laboratory studies, ordering and review of radiographic studies, pulse oximetry, re-evaluation of patient's condition, obtaining history from patient or surrogate and review of old charts   (including critical care time)  Medications Ordered in ED Medications  naloxone (NARCAN) 2 MG/2ML injection (has no administration in time range)  ondansetron (ZOFRAN) 4 MG/2ML injection (has no administration in time range)  norepinephrine (LEVOPHED) 4-5 MG/250ML-% infusion SOLN (has no administration in time range)  norepinephrine (LEVOPHED) 4mg  in 271mL premix infusion (0 mcg/min Intravenous Stopped 08/31/19 0935)  naloxone HCl (NARCAN) 4 mg in dextrose 5 % 250 mL infusion (0 mg/hr Intravenous Stopped 08/31/19 1438)  ceFEPIme (MAXIPIME) 2 g in sodium chloride 0.9 % 100 mL IVPB (has no administration in time range)  vancomycin variable dose per unstable renal function (pharmacist dosing) (has no administration in time range)   ondansetron (ZOFRAN) 4 MG/2ML injection (has no administration in time range)  bisacodyl (DULCOLAX) suppository 10 mg (10 mg Rectal Not Given 08/31/19 1848)  polyethylene glycol (MIRALAX / GLYCOLAX) packet 17 g (17 g Oral Not Given 08/31/19 1847)  docusate sodium (COLACE) capsule 100 mg (100 mg Oral Not Given 08/31/19 1847)  heparin injection 5,000 Units (5,000 Units Subcutaneous Given 08/31/19 1623)  acetaminophen (TYLENOL) tablet 650 mg (has no administration in time range)  ondansetron (ZOFRAN) injection 4 mg (has no administration in time range)  pantoprazole (PROTONIX) EC tablet 40 mg (40 mg Oral Not Given 08/31/19 1847)  ipratropium-albuterol (DUONEB) 0.5-2.5 (3) MG/3ML nebulizer solution 3 mL (has no administration in time range)  lactulose (CHRONULAC) 10 GM/15ML solution 30 g (30 g Per Tube Not Given 08/31/19 1636)  lactated ringers infusion ( Intravenous Rate/Dose Verify 08/31/19 1900)  Chlorhexidine Gluconate Cloth 2 % PADS 6 each (6 each Topical Given 08/31/19 1419)  naloxone (NARCAN) injection 1 mg (1 mg Intravenous Given 08/31/19 0737)  sodium chloride 0.9 % bolus 1,000 mL (0 mLs Intravenous Stopped 08/31/19 0913)    And  sodium chloride 0.9 % bolus 500 mL (0 mLs Intravenous Stopped 08/31/19 0834)    And  sodium chloride 0.9 % bolus 250 mL (0 mLs Intravenous Stopped 08/31/19 0913)  ceFEPIme (MAXIPIME) 2 g in sodium chloride 0.9 % 100 mL IVPB (0 g Intravenous Stopped 08/31/19 0948)  metroNIDAZOLE (FLAGYL) IVPB 500 mg (0 mg Intravenous Stopped 08/31/19 1034)  vancomycin (VANCOCIN) IVPB 1000 mg/200 mL premix (0 mg Intravenous Stopping Infusion hung by another clincian 08/31/19 1419)  ondansetron (ZOFRAN) injection 4 mg (4 mg Intravenous Given 08/31/19 0757)  naloxone (NARCAN) 2 MG/2ML injection (1 mg  Given 08/31/19 0930)  naloxone (NARCAN) injection 1 mg (1 mg Intravenous Given 08/31/19 0826)  sodium chloride 0.9 % bolus 1,000 mL (0 mLs Intravenous Stopped 08/31/19 1034)  ondansetron (ZOFRAN) injection 4 mg (4 mg  Intravenous Given 08/31/19 0945)     Initial Impression / Assessment and Plan / ED Course  I have reviewed the triage vital signs and the nursing notes.  Pertinent labs & imaging results that were available during my care of the patient were reviewed by me and considered in my medical decision making (see chart for details).        85 year old female with a history of hypertension, possible rheumatoid arthritis, osteoporosis, scoliosis, chronic back pain, history of remote pulmonary embolus, recent admission with concern for acute stroke with left upper extremity deficits, UTI, acute renal failure in the setting of rhabdomyolysis, elevated LFTs, who was discharged on August 8 to a rehab facility, and has since returned home and is living alone, who presents with concern for altered mental status, hypoxia, and hypotension.  Differential diagnosis on arrival broad and includes sepsis, pneumothorax or other traumatic injury (?fall), PE, toxic/metabolic, intracranial injury.  Patient hypoxic and hypotensive on arrival, however O2 saturations maintained on nasal cannula, IV fluids initiated.  Given 1mg  of narcan with improvement in mental status, patient waking to speak and say "I am going to vomit" but otherwise not back to baseline.  Given response we do feel opiate is contributing but I also suspect there is another underlying condition leading to her presentation.    On arrival, given IV fluids/empiric abx for possible sepsis of unknown etiology.  Portable chest and pelvis without signs of pneumothorax or injury.  Blood pressures initially improved in the ED but dropped again to the [id number] systolic. She was given narcan which improved her mental status but again was not back to baseline, in addition she was briefly started on low dose of levophed however this was discontinued and suspect improvements in blood pressures secondary to narcan.    Will initiate narcan gtt given patient's improvement with  the medication, however suspect other underlying pathology.  Labs significant for AKI, elevated CK 3175, lactate of 7.7,  WBC 29.   CT head and cervical spine show no acute abnormalities.  Given limited history, unclear if fall and significantly elevated lactic acid, I have ordered CT chest to evaluate for signs of rib fx, to better evaluate lungs in setting of hypoxia, and also CT abdomen pelvis to evaluate for signs of bowel ischemia given elevated lactate, limited hx, some abdominal tenderness on exam. CT shows no acute abnormalities.  Blood pressures again decreased and levophed restarted  Prior to narcan gtt initiated. Given additional narcan and levophed discontinued.  BP remain in 90s and pt more alert.  Consulted ICU for admission given pt on narcan gtt.  Recommend trial off narcan given pt has been fluid resuscitated. Blood pressures off narcan again decreased to 70s although mental status did not worsen. Unclear if changes secondary to underlying sepsis with opiates, BP improved and CCM consulted to admit patient.  Lactate improved to 3.5. DVT study pending. COVID test negative.      Angelica Bell was evaluated in Emergency Department on 08/31/2019 for the symptoms described in the history of present illness. She was evaluated in the context of the global COVID-19 pandemic, which necessitated consideration that the patient might be at risk for infection with the SARS-CoV-2 virus that causes COVID-19. Institutional protocols and algorithms that pertain to the evaluation of patients at risk for COVID-19 are in a state of rapid change based on information released by regulatory bodies including the CDC and federal and state organizations. These policies and algorithms were followed during the patient's care in the ED.     Final Clinical Impressions(s) / ED Diagnoses   Final diagnoses:  Altered mental status, unspecified altered mental status type  AKI (acute kidney injury) ([place])  Lactic  acidosis  Elevated CK  Opiate overdose, accidental or unintentional, initial encounter [facility])    ED Discharge Orders    None       Gareth Morgan, MD 08/31/19 2108

## 2019-08-31 NOTE — Progress Notes (Signed)
Lower extremity venous has been completed.   Preliminary results in CV Proc.   Angelica Bell 08/31/2019 3:45 PM

## 2019-08-31 NOTE — ED Notes (Signed)
Pt  Now complaining of severe pain in legs and body-- does have residual weakness from prior stroke on right side- is complaining of abd cramping and now also small amount of diarrhea. On bedpan at present.

## 2019-08-31 NOTE — Progress Notes (Signed)
RT NOTES: Called to room by RN d/t patient being brought in unresponsive. Pt placed on nonrebreather and then weaned down to 4lpm nasal cannula. Sats 100%. Pt c/o nausea. Will continue to monitor.

## 2019-08-31 NOTE — ED Notes (Signed)
Pt's upper and lower dentures removed, placed in cup on desk

## 2019-08-31 NOTE — H&P (Addendum)
NAME:  Angelica Bell, MRN:  [id number], DOB:  [birth year], LOS: 0 ADMISSION DATE:  08/31/2019, CONSULTATION DATE:  9/1 REFERRING MD:  Billy Fischer, CHIEF COMPLAINT:  Acute toxic encephalopathy and hypotension   Brief History   85 year old female with multiple medical comorbidities as mentioned below Admitted 9/1 after being found unresponsive; on evaluation in ER hypotensive -Placed on Narcan infusion with improvement in both mental status and blood pressure, critical care asked to admit   History of present illness   85 year old female patient who was admitted on 9/1 to [facility] after being found by her son minimally responsive, diaphoretic, and pale.  Noted to have resident discoloration over the right breast, left clavicle and both knees with question of possible fall.  EMS was called.  Was last seen in usual state of health the night prior. Emergency room course: -IV started, placed on supplemental oxygen. -Cultures sent Initial evaluation: Hypotensive, minimally responsive  lactic acid 7.7, down to 3.5 following fluid, creatinine 2.42, LFTs elevated, potassium 5.2 resuscitation , White blood cell count 29.8 CT brain: Evolutionary findings of small white matter infarcts in the left occipital lobe and right parietal lobe old right cerebellar infarct -Right pelvis with old prior hip replacement and left proximal femur fixation -C-spine with no acute changes -CT abdomen pelvis: Hiatal hernia 1.6 cm left thyroid nodule large amount of stool in the sigmoid colon and rectum Bibasilar bronchiectatic changes near the area of hiatal hernia   Started on  Narcan gtt.  With significant improvement in blood pressure and mental status  Critical care asked to admitf   Past Medical History  Prior CVA, HTN, arthritis, chronic pain.  -Most recently discharged on 8/8 following a hospital admission for acute CVA, dehydration, rhabdo and acute renal failure, discharged to skilled nursing  facility   [place] Events   9/1 admitted   Consults:    Procedures:    Significant Diagnostic Tests:  CT brain 9/1: 1. Evolutionary findings in the small white matter infarcts in the left occipital lobe right parietal lobe, currently hypodense indicating increased chronicity. Both of these were subacute at the time of prior brain MRI of 07/27/2019. 2. Old right cerebellar infarct. 3. Periventricular white matter and corona radiata hypodensities favor chronic ischemic microvascular white matter disease. 4. No acute findings. CT chest abd/pelvis 9/1: Large sliding-type hiatal hernia.1.6 cm left thyroid nodule is noted. Thyroid ultrasound is recommended for further evaluation. Large amount of stool is seen in sigmoid colon and rectum concerning for possible impaction.Bibasilar bronchiectatic changes near the area of hiatal hernia     Micro Data:  UC 9/1>>> BCX2 9/1>>>  Antimicrobials:  Cefepime 9/1>>> vanc 9/1>>>  Interim history/subjective:  More awake after narcan gtt started   Objective   Blood pressure (Abnormal) 93/56, pulse 72, temperature (Abnormal) 96.5 F (35.8 C), temperature source Temporal, resp. rate (Abnormal) 23, height 5\' 6"  (1.676 m), weight 55 kg, SpO2 100 %.        Intake/Output Summary (Last 24 hours) at 08/31/2019 1152 Last data filed at 08/31/2019 1034 Gross per 24 hour  Intake 3350 ml  Output 200 ml  Net 3150 ml   Filed Weights   08/31/19 0742 08/31/19 0840  Weight: 55 kg 55 kg    Examination: General: frail 85 year old female no acute distress on narcan HENT: NCAT no JVD  Lungs: clear  Cardiovascular: RRR no MRG Abdomen: slightly firm, tender to palp. + bowel sounds  Extremities: warm and dry  Neuro:  awake and oriented  GU: due to void   Resolved Hospital Problem list     Assessment & Plan:  Acute toxic and possibly metabolic encephalopathy.  -given response to Narcan favor narcotic induced, but prior CVA and new AKI  +/- hypotension could be contributing factors. Plan Cont supportive care  Narcan gtt Holding narcotics IVFs Supplemental oxygen  ICU admit  SIRS/possible sepsis -has hiatal hernia and bronchiectatic changes on CT chest w/ sig constipation so would raise concern for aspiration. Also UA boarderline Plan Culture blood and urine abx day 1 (vanc and maxipime)  Hypotension/ciculatory shock.  Responding to narcan and IVFs so although sepsis in ddx seems less likely Plan Admit to ICU Cont IVFs abx as above Narcan gtt  AKI 2/2 organ hypoperfusion w/ lactic acidosis  ->elevated total CKs raising concern for possible rhabdo  ->LA clearing  Plan IVFs Hold her home NSAIDS Strict I&O Renal dose meds Am chemistry  Ck urine myoglobin Repeat total CKs  Narcotic related constipation w/ possible fecal impaction Plan Added bowel regimen   Elevate ammonia w/ elevated LFTs c/w possible etoh Plan Adding lactulose  Should also help w/ constipation Am LFTs  H/o hiatal hernia Plan PPI Reflux and aspiration precautions  Dm Plan ssi   Best practice:  Diet: NPO Pain/Anxiety/Delirium protocol (if indicated): NA VAP protocol (if indicated): NA DVT prophylaxis: Ada heparin GI prophylaxis: PPI Glucose control: NA Mobility: BR Code Status: Full code Family Communication: pending Disposition: We will admit her to the intensive.  Working dx of  acute toxic and likely metabolic encephalopathy.  Clearly multifactorial in the setting narcotic, also wonder about concomitant EtOH abuse plus minus dehydration.  Total CKs are elevated she has had acute kidney injury in the past secondary to rhabdomyolysis.  Will admit to intensive care supportively with IV fluids, Narcan drip for now, and empiric antibiotics to potentially cover for infection.  He does have a large hiatal hernia, with a complicated by significant constipation raising concern for possible aspiration.  Labs   CBC: Recent Labs   Lab 08/31/19 0753 08/31/19 0841  WBC  --  29.8*  NEUTROABS  --  25.9*  HGB 14.6 10.2*  HCT 43.0 34.7*  MCV  --  98.0  PLT  --  [id number]    Basic Metabolic Panel: Recent Labs  Lab 08/31/19 0741 08/31/19 0753  NA 140 137  K 5.2* 5.1  CL 103 104  CO2 19*  --   GLUCOSE 108* 99  BUN 27* 39*  CREATININE 2.42* 2.20*  CALCIUM 8.9  --   MG 2.3  --   PHOS 10.3*  --    GFR: Estimated Creatinine Clearance: 16.2 mL/min (A) (by C-G formula based on SCr of 2.2 mg/dL (H)). Recent Labs  Lab 08/31/19 0739 08/31/19 0841 08/31/19 0945  WBC  --  29.8*  --   LATICACIDVEN 7.7*  --  3.5*    Liver Function Tests: Recent Labs  Lab 08/31/19 0741  AST 118*  ALT 64*  ALKPHOS 88  BILITOT 0.5  PROT 6.4*  ALBUMIN 3.7   No results for input(s): LIPASE, AMYLASE in the last 168 hours. Recent Labs  Lab 08/31/19 0741  AMMONIA 64*    ABG    Component Value Date/Time   HCO3 29.2 (H) 10/25/2017 1905   TCO2 23 08/31/2019 0753   O2SAT 38.0 10/25/2017 1905     Coagulation Profile: Recent Labs  Lab 08/31/19 0741  INR 1.2    Cardiac Enzymes: Recent Labs  Lab 08/31/19 0741  CKTOTAL 3,175*    HbA1C: Hgb A1c MFr Bld  Date/Time Value Ref Range Status  07/28/2019 05:44 AM 5.9 (H) 4.8 - 5.6 % Final    Comment:    (NOTE) Pre diabetes:          5.7%-6.4% Diabetes:              >6.4% Glycemic control for   <7.0% adults with diabetes   10/12/2014 01:44 PM 6.2 (H) 4.8 - 5.6 % Final    Comment:             Increased risk for diabetes: 5.7 - 6.4          Diabetes: >6.4          Glycemic control for adults with diabetes: <7.0    CBG: No results for input(s): GLUCAP in the last 168 hours.  Review of Systems:   Not able   Past Medical History  She,  has a past medical history of Chronic back pain, Chronic pain disorder, Dry eyes, Hypertension, Osteoporosis, PE (pulmonary embolism), and Pneumonia.   Surgical History    Past Surgical History:  Procedure Laterality Date  .  ABDOMINAL HYSTERECTOMY    . APPENDECTOMY    . BREAST ENHANCEMENT SURGERY    . CATARACT EXTRACTION    . FEMUR IM NAIL Left 02/17/2017   Procedure: INTRAMEDULLARY (IM) NAIL FEMORAL;  Surgeon: Nicholes Stairs, MD;  Location: [facility];  Service: Orthopedics;  Laterality: Left;  . HIP ARTHROPLASTY Right 12/24/2015   Procedure: ARTHROPLASTY BIPOLAR HIP (HEMIARTHROPLASTY);  Surgeon: Netta Cedars, MD;  Location: [facility];  Service: Orthopedics;  Laterality: Right;  . TONSILLECTOMY       Social History   reports that she quit smoking about 43 years ago. She quit after 13.00 years of use. She has never used smokeless tobacco. She reports that she does not drink alcohol or use drugs.   Family History   Her family history includes Heart Problems in her mother.   Allergies Allergies  Allergen Reactions  . Hctz [Hydrochlorothiazide]     Significant and rapid hyponatremia (TIH)  . Amoxicillin-Pot Clavulanate Hives and Diarrhea    Has patient had a PCN reaction causing immediate rash, facial/tongue/throat swelling, SOB or lightheadedness with hypotension: yes Has patient had a PCN reaction causing severe rash involving mucus membranes or skin necrosis: yes Has patient had a PCN reaction that required hospitalization : unknown Has patient had a PCN reaction occurring within the last 10 years: yes If all of the above answers are "NO", then may proceed with Cephalosporin use.   Marland Kitchen Lisinopril-Hydrochlorothiazide Other (See Comments)    Tired- no energy to do anything..  . Sulfa Antibiotics Hives and Nausea And Vomiting  . Sulfonamide Derivatives Hives     Home Medications  Prior to Admission medications   Medication Sig Start Date End Date Taking? Authorizing Provider  aspirin EC 81 MG tablet Take 1 tablet (81 mg total) by mouth 2 (two) times daily. Patient taking differently: Take 81 mg by mouth daily.  02/19/17  Yes Bonnielee Haff, MD  atorvastatin (LIPITOR) 10 MG tablet Take 1 tablet (10 mg  total) by mouth daily at 6 PM. 08/07/19  Yes Mariel Aloe, MD  escitalopram (LEXAPRO) 20 MG tablet Take 1 tablet (20 mg total) by mouth daily. 08/08/19  Yes Mariel Aloe, MD  Menthol-Methyl Salicylate (MUSCLE RUB) 10-15 % CREA Apply 1 application topically as needed for muscle pain. 08/07/19  Yes Mariel Aloe, MD  mirabegron ER (MYRBETRIQ) 25 MG TB24 tablet Take 1 tablet (25 mg total) by mouth daily. 08/08/19  Yes Mariel Aloe, MD  oxycodone (ROXICODONE) 30 MG immediate release tablet Take 1 tablet (30 mg total) by mouth 3 (three) times daily as needed for pain. 08/07/19  Yes Mariel Aloe, MD     Critical care time: 33 minutes  Erick Colace ACNP-BC [place] Pager # [phone] OR # [phone] if no answer

## 2019-08-31 NOTE — ED Notes (Signed)
Levo stopped - Narcan 1mg  given IV

## 2019-08-31 NOTE — ED Notes (Signed)
Pt to CT- vomited small amount of coffee ground emesis on arrival to CT- remains only responsive to painful stimuli-

## 2019-09-01 ENCOUNTER — Inpatient Hospital Stay (HOSPITAL_COMMUNITY): Payer: Medicare Other

## 2019-09-01 DIAGNOSIS — M6282 Rhabdomyolysis: Secondary | ICD-10-CM

## 2019-09-01 DIAGNOSIS — G9341 Metabolic encephalopathy: Principal | ICD-10-CM

## 2019-09-01 DIAGNOSIS — N390 Urinary tract infection, site not specified: Secondary | ICD-10-CM

## 2019-09-01 DIAGNOSIS — R571 Hypovolemic shock: Secondary | ICD-10-CM

## 2019-09-01 DIAGNOSIS — R740 Nonspecific elevation of levels of transaminase and lactic acid dehydrogenase [LDH]: Secondary | ICD-10-CM

## 2019-09-01 DIAGNOSIS — N179 Acute kidney failure, unspecified: Secondary | ICD-10-CM

## 2019-09-01 LAB — GLUCOSE, CAPILLARY
Glucose-Capillary: 106 mg/dL — ABNORMAL HIGH (ref 70–99)
Glucose-Capillary: 109 mg/dL — ABNORMAL HIGH (ref 70–99)
Glucose-Capillary: 149 mg/dL — ABNORMAL HIGH (ref 70–99)

## 2019-09-01 LAB — BASIC METABOLIC PANEL
Anion gap: 11 (ref 5–15)
BUN: 34 mg/dL — ABNORMAL HIGH (ref 8–23)
CO2: 21 mmol/L — ABNORMAL LOW (ref 22–32)
Calcium: 7.6 mg/dL — ABNORMAL LOW (ref 8.9–10.3)
Chloride: 106 mmol/L (ref 98–111)
Creatinine, Ser: 1.82 mg/dL — ABNORMAL HIGH (ref 0.44–1.00)
GFR calc Af Amer: 29 mL/min — ABNORMAL LOW (ref >60)
GFR calc non Af Amer: 25 mL/min — ABNORMAL LOW (ref >60)
Glucose, Bld: 112 mg/dL — ABNORMAL HIGH (ref 70–99)
Potassium: 4.7 mmol/L (ref 3.5–5.1)
Sodium: 138 mmol/L (ref 135–145)

## 2019-09-01 LAB — HEPATIC FUNCTION PANEL
ALT: 502 U/L — ABNORMAL HIGH (ref 0–44)
AST: 666 U/L — ABNORMAL HIGH (ref 15–41)
Albumin: 2.3 g/dL — ABNORMAL LOW (ref 3.5–5.0)
Alkaline Phosphatase: 90 U/L (ref 38–126)
Bilirubin, Direct: 0.2 mg/dL (ref 0.0–0.2)
Indirect Bilirubin: 0.2 mg/dL — ABNORMAL LOW (ref 0.3–0.9)
Total Bilirubin: 0.4 mg/dL (ref 0.3–1.2)
Total Protein: 4.3 g/dL — ABNORMAL LOW (ref 6.5–8.1)

## 2019-09-01 LAB — CBC
HCT: 36.7 % (ref 36.0–46.0)
Hemoglobin: 11.2 g/dL — ABNORMAL LOW (ref 12.0–15.0)
MCH: 28.4 pg (ref 26.0–34.0)
MCHC: 30.5 g/dL (ref 30.0–36.0)
MCV: 93.1 fL (ref 80.0–100.0)
Platelets: 242 10*3/uL (ref 150–400)
RBC: 3.94 MIL/uL (ref 3.87–5.11)
RDW: 14 % (ref 11.5–15.5)
WBC: 24.5 10*3/uL — ABNORMAL HIGH (ref 4.0–10.5)
nRBC: 0 % (ref 0.0–0.2)

## 2019-09-01 LAB — CK: Total CK: 18996 U/L — ABNORMAL HIGH (ref 38–234)

## 2019-09-01 LAB — MAGNESIUM: Magnesium: 1.7 mg/dL (ref 1.7–2.4)

## 2019-09-01 LAB — PHOSPHORUS: Phosphorus: 4.7 mg/dL — ABNORMAL HIGH (ref 2.5–4.6)

## 2019-09-01 MED ORDER — ASPIRIN EC 81 MG PO TBEC
81.0000 mg | DELAYED_RELEASE_TABLET | Freq: Every day | ORAL | Status: DC
  Administered 2019-09-01 – 2019-09-15 (×15): 81 mg via ORAL
  Filled 2019-09-01 (×15): qty 1

## 2019-09-01 MED ORDER — OXYCODONE HCL 5 MG PO TABS
20.0000 mg | ORAL_TABLET | Freq: Once | ORAL | Status: AC
  Administered 2019-09-01: 21:00:00 20 mg via ORAL
  Filled 2019-09-01: qty 4

## 2019-09-01 MED ORDER — ESCITALOPRAM OXALATE 20 MG PO TABS
20.0000 mg | ORAL_TABLET | Freq: Every day | ORAL | Status: DC
  Administered 2019-09-01 – 2019-09-15 (×15): 20 mg via ORAL
  Filled 2019-09-01 (×15): qty 1

## 2019-09-01 MED ORDER — LIP MEDEX EX OINT
TOPICAL_OINTMENT | CUTANEOUS | Status: DC | PRN
  Filled 2019-09-01: qty 7

## 2019-09-01 MED ORDER — OXYCODONE HCL 5 MG/5ML PO SOLN: 30.0000 mg | Freq: Once | ORAL | Status: DC

## 2019-09-01 NOTE — Progress Notes (Signed)
eLink Physician-Brief Progress Note Patient Name: Angelica Bell DOB: 08/16/1932 MRN: [id number]   Date of Service  09/01/2019  HPI/Events of Note  Notified of urinary retention.  Pt required straight catheterization in the morning.  She has not had urine output overnight. Bladder scan reveals 356ml.  eICU Interventions  Insert foley cath for close monitoring of I&Os given AKI.     Intervention Category Minor Interventions: Other:  Elsie Lincoln 09/01/2019, 1:54 AM

## 2019-09-01 NOTE — Progress Notes (Signed)
Pt agreed to have foley placed

## 2019-09-01 NOTE — Progress Notes (Signed)
Received order to place foley catheter.  Advised pt of orders and pt refused to allow the foley catheter be placed.  Called elink to advise

## 2019-09-01 NOTE — Progress Notes (Signed)
PROGRESS NOTE    TERISHA BORUM  [id number] DOB: [birth year] DOA: 08/31/2019 PCP: Heywood Bene, PA-C   Brief Narrative: 85 year old female with past medical history of prior CVA, hypertension, arthritis, chronic pain most recently discharged 8/8 following acute CVA, dehydration, rhabdomyolysis acute renal failure who was found down at home by the son. Apparently fell in bathroom, took over an hour to get her bearings and dragged herself out toward phone.  Son found her almost unresponsive and brought in by EMS.  Patient responded very well to narcan and narcan drip.  She did not respond to fluid resuscitation in terms of Bps so PCCM asked to admit for hypotension. She also had  rhabdo,aki, lactic acidosis, borderline UA, leucocytosis CT head no acute finding. Utox with benzos and opiates. Not sure where benzodiazepines came from but note history of taking too many xanax back in 2012. Per PCCM overall impression here is she is over-medicated at home, fell, and had rhabdo/AKI from prolonged downtime and poor PO.  Also has fecal impaction, deconditioning, elevated ammonia and AST/ALT pattern that could be indicative of EtOH use although she denies.  Shock seems more hypovolemic given response to fluids.  Questionable UTI but I guess will treat with AMS, leukocytosis, and UA, d/c abx if Pct < 0.5. Patient's blood pressure improved and she was transferred to [facility] service 9/3  Subjective: Overnight urine retention- needing foley. She is aaox2-3 baseline Lately more confused at home. Weak left side, old per patient/staff Bradycardia yesterday- but likely lead issues- after it was changed NSR and doing well  Assessment & Plan:  Acute toxic, possibly metabolic Encephalopathy, secondary to UTI metabolic abnormalities,opiates: Mental status overall stable alert awake oriented x2-3,  Consulted PT OT, anticipatingSNF placement.  Avoid narcotics, patient was initially given Narcan drip-she is on  oxycodone 30 mg 3 times a day as needed at home will likely need to readjust the dose given her presentation.  Resume her Lexapro. Consider resuming low dose oxy.  Rhabdomyolysis: CK uptrending from 3k-->18k.  Likely from fall. IV fluid hydration  SIRS/Leucocytosis: Likely from UTI.  CT chest with constipation and bronchiectatic changes, leukocytosis downtrending, afebrile.  Continue on cefepime as growing Pseudomonas DC vancomycin continue hydration.  Follow-up blood culture.  Pseudomonas UTI: Continue cefepime DC vancomycin.  Leukocytosis downtrending. Recent Labs  Lab 08/31/19 0841 09/01/19 0236  WBC 29.8* 24.5*   Hypotension/shock likely volume depletion/related to opiate?Marland Kitchen Off pressors, continue fluid, blood pressure has improved.  Blood culture pending,  AKI: In the setting of hypotension, rhabdomyolysis.  Creatinine improving continue IV fluids.  Hold home NSAIDs monitor intake output. Recent Labs  Lab 08/31/19 0741 08/31/19 0753 09/01/19 0236  BUN 27* 39* 34*  CREATININE 2.42* 2.20* 1.82*   Constipation: Likely from narcotics, continue bowel regimen lactulose  Transaminitis/Elevated Ammonia: bump overnight, check viral hep panel.  CT abd 9/1 normal Hepatobiliary: "No focal liver abnormality is seen. No gallstones, gallbladder wall thickening, or biliary dilatation. Pancreas: Unremarkable. No pancreatic ductal dilatation or surrounding inflammatory changes", elevated LFTs likely from hypotension/rhabdomyolysis.  Avoid hepatotoxic medication.  Hold Lipitor  Recent Labs  Lab 08/31/19 0741 09/01/19 0236  AST 118* 666*  ALT 64* 502*  ALKPHOS 88 90  BILITOT 0.5 0.4  PROT 6.4* 4.3*  ALBUMIN 3.7 2.3*  INR 1.2  --    Hx of CVA: Resume aspirin hold Lipitor due to elevated LFTs, PT OT  Hx of hiatal hernia/gerd- cont ppi  DM, hba1c 5.9 on 7/19- cont ssi  DVT  prophylaxis:Heparin Code Status: Full code Family Communication: plan of care discussed with patient in  detail. Disposition Plan: Remains inpatient pending clinical improvement.  Keep on progressive unit for now.PT/OT eval for possible SNF.  Consultants:  CCM  Procedures: CT brain 9/1: 1. Evolutionary findings in the small white matter infarcts in the left occipital lobe right parietal lobe, currently hypodense indicating increased chronicity. Both of these were subacute at the time of prior brain MRI of 07/27/2019. 2. Old right cerebellar infarct. 3. Periventricular white matter and corona radiata hypodensities favor chronic ischemic microvascular white matter disease. 4. No acute findings. CT chest abd/pelvis 9/1: Large sliding-type hiatal hernia.1.6 cm left thyroid nodule is noted. Thyroid ultrasound is recommended for further evaluation. Large amount of stool is seen in sigmoid colon and rectum concerning for possible impaction.Bibasilar bronchiectatic changes near the area of hiatal hernia    Microbiology:  UC 9/1>>> BCX2 9/1>>>  Antimicrobials: Anti-infectives (From admission, onward)   Start     Dose/Rate Route Frequency Ordered Stop   09/01/19 0900  ceFEPIme (MAXIPIME) 2 g in sodium chloride 0.9 % 100 mL IVPB     2 g 200 mL/hr over 30 Minutes Intravenous Every 24 hours 08/31/19 0900     08/31/19 0900  vancomycin variable dose per unstable renal function (pharmacist dosing)      Does not apply See admin instructions 08/31/19 0900     08/31/19 0800  ceFEPIme (MAXIPIME) 2 g in sodium chloride 0.9 % 100 mL IVPB     2 g 200 mL/hr over 30 Minutes Intravenous  Once 08/31/19 0747 08/31/19 0948   08/31/19 0800  metroNIDAZOLE (FLAGYL) IVPB 500 mg     500 mg 100 mL/hr over 60 Minutes Intravenous  Once 08/31/19 0747 08/31/19 1034   08/31/19 0800  vancomycin (VANCOCIN) IVPB 1000 mg/200 mL premix     1,000 mg 200 mL/hr over 60 Minutes Intravenous  Once 08/31/19 0747 08/31/19 1419       Objective: Vitals:   09/01/19 0400 09/01/19 0437 09/01/19 0500 09/01/19 0600  BP: (!) 83/45   111/79 (!) 100/49  Pulse: 73  78 76  Resp: 13  20 16   Temp: 99.7 F (37.6 C)  99.7 F (37.6 C) 100 F (37.8 C)  TempSrc: Bladder     SpO2: 100%  95% 95%  Weight:  72.4 kg    Height:        Intake/Output Summary (Last 24 hours) at 09/01/2019 0835 Last data filed at 09/01/2019 0600 Gross per 24 hour  Intake 4999.69 ml  Output 500 ml  Net 4499.69 ml   Filed Weights   08/31/19 0742 08/31/19 0840 09/01/19 0437  Weight: 55 kg 55 kg 72.4 kg   Weight change:   Body mass index is 25.76 kg/m.  Intake/Output from previous day: 09/01 0701 - 09/02 0700 In: 5499.7 [I.V.:2372.4; IV Piggyback:3127.3] Out: 500 [Urine:500] Intake/Output this shift: No intake/output data recorded.  Examination:  General exam: Appears calm and comfortable,NAD HEENT:PERRL,Oral mucosa moist, Ear/Nose normal on gross exam Respiratory system: Bilateral equal air entry, normal vesicular breath sounds, no wheezes or crackles  Cardiovascular system: S1 & S2 heard,No JVD, murmurs. Gastrointestinal system: Abdomen is  soft, non tender, non distended, BS +  Nervous System:Alert and oriented TO SELF, PLACE YEAR- THINKS IT IS AUGUST Weaker let side?- b/l gen weakness Extremities: No edema, no clubbing, distal peripheral pulses palpable. Skin: No rashes, lesions, no icterus MSK: Normal muscle bulk,tone ,power  Medications:  Scheduled Meds:  bisacodyl  10 mg Rectal Once   Chlorhexidine Gluconate Cloth  6 each Topical Daily   docusate sodium  100 mg Oral BID   heparin  5,000 Units Subcutaneous Q8H   lactulose  30 g Per Tube TID   pantoprazole  40 mg Oral Daily   polyethylene glycol  17 g Oral Daily   vancomycin variable dose per unstable renal function (pharmacist dosing)   Does not apply See admin instructions   Continuous Infusions:  ceFEPime (MAXIPIME) IV     lactated ringers 125 mL/hr at 09/01/19 0738   naLOXone [facility]) adult infusion for OVERDOSE Stopped (08/31/19 1438)   norepinephrine  (LEVOPHED) Adult infusion Stopped (08/31/19 0935)    Data Reviewed: I have personally reviewed following labs and imaging studies  CBC: Recent Labs  Lab 08/31/19 0753 08/31/19 0841 09/01/19 0236  WBC  --  29.8* 24.5*  NEUTROABS  --  25.9*  --   HGB 14.6 10.2* 11.2*  HCT 43.0 34.7* 36.7  MCV  --  98.0 93.1  PLT  --  236 [id number]   Basic Metabolic Panel: Recent Labs  Lab 08/31/19 0741 08/31/19 0753 09/01/19 0236  NA 140 137 138  K 5.2* 5.1 4.7  CL 103 104 106  CO2 19*  --  21*  GLUCOSE 108* 99 112*  BUN 27* 39* 34*  CREATININE 2.42* 2.20* 1.82*  CALCIUM 8.9  --  7.6*  MG 2.3  --  1.7  PHOS 10.3*  --  4.7*   GFR: Estimated Creatinine Clearance: 23 mL/min (A) (by C-G formula based on SCr of 1.82 mg/dL (H)). Liver Function Tests: Recent Labs  Lab 08/31/19 0741 09/01/19 0236  AST 118* 666*  ALT 64* 502*  ALKPHOS 88 90  BILITOT 0.5 0.4  PROT 6.4* 4.3*  ALBUMIN 3.7 2.3*   No results for input(s): LIPASE, AMYLASE in the last 168 hours. Recent Labs  Lab 08/31/19 0741  AMMONIA 64*   Coagulation Profile: Recent Labs  Lab 08/31/19 0741  INR 1.2   Cardiac Enzymes: Recent Labs  Lab 08/31/19 0741  CKTOTAL 3,175*   BNP (last 3 results) No results for input(s): PROBNP in the last 8760 hours. HbA1C: No results for input(s): HGBA1C in the last 72 hours. CBG: Recent Labs  Lab 08/31/19 1414 08/31/19 1717  GLUCAP 152* 147*   Lipid Profile: No results for input(s): CHOL, HDL, LDLCALC, TRIG, CHOLHDL, LDLDIRECT in the last 72 hours. Thyroid Function Tests: No results for input(s): TSH, T4TOTAL, FREET4, T3FREE, THYROIDAB in the last 72 hours. Anemia Panel: No results for input(s): VITAMINB12, FOLATE, FERRITIN, TIBC, IRON, RETICCTPCT in the last 72 hours. Sepsis Labs: Recent Labs  Lab 08/31/19 0739 08/31/19 0741 08/31/19 0945  PROCALCITON  --  0.65  --   LATICACIDVEN 7.7*  --  3.5*    Recent Results (from the past 240 hour(s))  SARS Coronavirus 2  [facility] order, Performed in [facility] hospital lab) Nasopharyngeal Nasopharyngeal Swab     Status: None   Collection Time: 08/31/19  8:00 AM   Specimen: Nasopharyngeal Swab  Result Value Ref Range Status   SARS Coronavirus 2 NEGATIVE NEGATIVE Final    Comment: (NOTE) If result is NEGATIVE SARS-CoV-2 target nucleic acids are NOT DETECTED. The SARS-CoV-2 RNA is generally detectable in upper and lower  respiratory specimens during the acute phase of infection. The lowest  concentration of SARS-CoV-2 viral copies this assay can detect is 250  copies / mL. A negative result does not preclude SARS-CoV-2 infection  and  should not be used as the sole basis for treatment or other  patient management decisions.  A negative result may occur with  improper specimen collection / handling, submission of specimen other  than nasopharyngeal swab, presence of viral mutation(s) within the  areas targeted by this assay, and inadequate number of viral copies  (<250 copies / mL). A negative result must be combined with clinical  observations, patient history, and epidemiological information. If result is POSITIVE SARS-CoV-2 target nucleic acids are DETECTED. The SARS-CoV-2 RNA is generally detectable in upper and lower  respiratory specimens dur ing the acute phase of infection.  Positive  results are indicative of active infection with SARS-CoV-2.  Clinical  correlation with patient history and other diagnostic information is  necessary to determine patient infection status.  Positive results do  not rule out bacterial infection or co-infection with other viruses. If result is PRESUMPTIVE POSTIVE SARS-CoV-2 nucleic acids MAY BE PRESENT.   A presumptive positive result was obtained on the submitted specimen  and confirmed on repeat testing.  While 2019 novel coronavirus  (SARS-CoV-2) nucleic acids may be present in the submitted sample  additional confirmatory testing may be necessary for  epidemiological  and / or clinical management purposes  to differentiate between  SARS-CoV-2 and other Sarbecovirus currently known to infect humans.  If clinically indicated additional testing with an alternate test  methodology [phone]) is advised. The SARS-CoV-2 RNA is generally  detectable in upper and lower respiratory sp ecimens during the acute  phase of infection. The expected result is Negative. Fact Sheet for Patients:  StrictlyIdeas.no Fact Sheet for Healthcare Providers: BankingDealers.co.za This test is not yet approved or cleared by the [place] FDA and has been authorized for detection and/or diagnosis of SARS-CoV-2 by FDA under an Emergency Use Authorization (EUA).  This EUA will remain in effect (meaning this test can be used) for the duration of the COVID-19 declaration under Section 564(b)(1) of the Act, 21 U.S.C. section 360bbb-3(b)(1), unless the authorization is terminated or revoked sooner. Performed at [facility], [place] [address], [place], [place] [address]   MRSA PCR Screening     Status: None   Collection Time: 08/31/19  2:28 PM   Specimen: Nasal Mucosa; Nasopharyngeal  Result Value Ref Range Status   MRSA by PCR NEGATIVE NEGATIVE Final    Comment:        The GeneXpert MRSA Assay (FDA approved for NASAL specimens only), is one component of a comprehensive MRSA colonization surveillance program. It is not intended to diagnose MRSA infection nor to guide or monitor treatment for MRSA infections. Performed at [facility], [place] [address], [place], [place] [address]       Radiology Studies: Ct Abdomen Pelvis Wo Contrast  Result Date: 08/31/2019 CLINICAL DATA:  Rib pain, possible acute mesenteric ischemia. EXAM: CT CHEST, ABDOMEN AND PELVIS WITHOUT CONTRAST TECHNIQUE: Multidetector CT imaging of the chest, abdomen and pelvis was performed following the standard protocol without IV  contrast. COMPARISON:  None. FINDINGS: CT CHEST FINDINGS Cardiovascular: Atherosclerosis of thoracic aorta is noted without aneurysm formation. Dissection cannot be excluded due to the lack of intravenous contrast. Normal cardiac size. No pericardial effusion is noted. Mild coronary artery calcifications are noted. Mediastinum/Nodes: Large sliding-type hiatal hernia is noted. 16 mm left thyroid nodule is noted. No adenopathy is noted. Lungs/Pleura: Lungs are clear. No pleural effusion or pneumothorax. Musculoskeletal: No chest wall mass or suspicious bone lesions identified. CT ABDOMEN PELVIS FINDINGS Hepatobiliary: No  focal liver abnormality is seen. No gallstones, gallbladder wall thickening, or biliary dilatation. Pancreas: Unremarkable. No pancreatic ductal dilatation or surrounding inflammatory changes. Spleen: Normal in size without focal abnormality. Adrenals/Urinary Tract: Adrenal glands and kidneys appear normal. No hydronephrosis or renal obstruction is noted. No renal or ureteral calculi are noted. Evaluation of the bladder is severely limited due to scatter artifact arising from bilateral hip surgical hardware. Stomach/Bowel: The stomach is unremarkable. There is no evidence of bowel obstruction or inflammation. The appendix is not clearly visualized. Large amount of stool is seen in the sigmoid colon and rectum concerning for possible impaction. Vascular/Lymphatic: Aortic atherosclerosis. No enlarged abdominal or pelvic lymph nodes. Due to the lack of intravenous contrast, mesenteric ischemia cannot be excluded on the basis of this exam. Reproductive: Status post hysterectomy. No adnexal masses. Other: No abdominal wall hernia or abnormality. No abdominopelvic ascites. Musculoskeletal: Status post right hip arthroplasty. Status post surgical repair of proximal left femoral fracture. No acute osseous abnormality is noted. IMPRESSION: Large sliding-type hiatal hernia. 1.6 cm left thyroid nodule is noted.  Thyroid ultrasound is recommended for further evaluation. Large amount of stool is seen in sigmoid colon and rectum concerning for possible impaction. No definite acute abnormality is noted in the chest, abdomen or pelvis. Due to the lack of intravenous contrast, the patency of mesenteric vessels could not be assessed on this study. Aortic Atherosclerosis (ICD10-I70.0). Electronically Signed   By: Marijo Conception M.D.   On: 08/31/2019 10:43   Ct Head Wo Contrast  Result Date: 08/31/2019 CLINICAL DATA:  Altered level of consciousness EXAM: CT HEAD WITHOUT CONTRAST TECHNIQUE: Contiguous axial images were obtained from the base of the skull through the vertex without intravenous contrast. COMPARISON:  Multiple exams, including brain MRI dated 07/27/2019 FINDINGS: Brain: Remote right cerebellar infarct, image 8/3. Hypodensity in the left occipital white matter on image 17/3 corresponding to the region of at the time subacute infarct shown on MRI from 07/27/2019. 1.1 by 0.6 cm prior small infarct in the right parietal corona radiata corresponding to the at the time subacute infarct shown on 07/27/2019 MRI. Periventricular white matter and corona radiata hypodensities favor chronic ischemic microvascular white matter disease. Otherwise, the brainstem, cerebellum, cerebral peduncles, thalamus, basal ganglia, basilar cisterns, and ventricular system appear within normal limits. No intracranial hemorrhage, mass lesion, or acute CVA. Vascular: There is atherosclerotic calcification of the cavernous carotid arteries bilaterally. Vertebral artery atherosclerotic calcification. Skull: Unremarkable Sinuses/Orbits: Mild chronic left sphenoid, left maxillary, and ethmoid sinusitis. Other: No supplemental non-categorized findings. IMPRESSION: 1. Evolutionary findings in the small white matter infarcts in the left occipital lobe right parietal lobe, currently hypodense indicating increased chronicity. Both of these were subacute  at the time of prior brain MRI of 07/27/2019. 2. Old right cerebellar infarct. 3. Periventricular white matter and corona radiata hypodensities favor chronic ischemic microvascular white matter disease. 4. No acute findings. 5. Atherosclerosis. 6. Mild chronic paranasal sinusitis. Electronically Signed   By: Van Clines M.D.   On: 08/31/2019 08:48   Ct Chest Wo Contrast  Result Date: 08/31/2019 CLINICAL DATA:  Rib pain, possible acute mesenteric ischemia. EXAM: CT CHEST, ABDOMEN AND PELVIS WITHOUT CONTRAST TECHNIQUE: Multidetector CT imaging of the chest, abdomen and pelvis was performed following the standard protocol without IV contrast. COMPARISON:  None. FINDINGS: CT CHEST FINDINGS Cardiovascular: Atherosclerosis of thoracic aorta is noted without aneurysm formation. Dissection cannot be excluded due to the lack of intravenous contrast. Normal cardiac size. No pericardial effusion is noted. Mild  coronary artery calcifications are noted. Mediastinum/Nodes: Large sliding-type hiatal hernia is noted. 16 mm left thyroid nodule is noted. No adenopathy is noted. Lungs/Pleura: Lungs are clear. No pleural effusion or pneumothorax. Musculoskeletal: No chest wall mass or suspicious bone lesions identified. CT ABDOMEN PELVIS FINDINGS Hepatobiliary: No focal liver abnormality is seen. No gallstones, gallbladder wall thickening, or biliary dilatation. Pancreas: Unremarkable. No pancreatic ductal dilatation or surrounding inflammatory changes. Spleen: Normal in size without focal abnormality. Adrenals/Urinary Tract: Adrenal glands and kidneys appear normal. No hydronephrosis or renal obstruction is noted. No renal or ureteral calculi are noted. Evaluation of the bladder is severely limited due to scatter artifact arising from bilateral hip surgical hardware. Stomach/Bowel: The stomach is unremarkable. There is no evidence of bowel obstruction or inflammation. The appendix is not clearly visualized. Large amount of  stool is seen in the sigmoid colon and rectum concerning for possible impaction. Vascular/Lymphatic: Aortic atherosclerosis. No enlarged abdominal or pelvic lymph nodes. Due to the lack of intravenous contrast, mesenteric ischemia cannot be excluded on the basis of this exam. Reproductive: Status post hysterectomy. No adnexal masses. Other: No abdominal wall hernia or abnormality. No abdominopelvic ascites. Musculoskeletal: Status post right hip arthroplasty. Status post surgical repair of proximal left femoral fracture. No acute osseous abnormality is noted. IMPRESSION: Large sliding-type hiatal hernia. 1.6 cm left thyroid nodule is noted. Thyroid ultrasound is recommended for further evaluation. Large amount of stool is seen in sigmoid colon and rectum concerning for possible impaction. No definite acute abnormality is noted in the chest, abdomen or pelvis. Due to the lack of intravenous contrast, the patency of mesenteric vessels could not be assessed on this study. Aortic Atherosclerosis (ICD10-I70.0). Electronically Signed   By: Marijo Conception M.D.   On: 08/31/2019 10:43   Ct Cervical Spine Wo Contrast  Result Date: 08/31/2019 CLINICAL DATA:  85 year old female with history of altered mental status. EXAM: CT CERVICAL SPINE WITHOUT CONTRAST TECHNIQUE: Multidetector CT imaging of the cervical spine was performed without intravenous contrast. Multiplanar CT image reconstructions were also generated. COMPARISON:  Cervical spine CT 07/29/2017. FINDINGS: Alignment: Normal. Skull base and vertebrae: No acute fracture. No primary bone lesion or focal pathologic process. Soft tissues and spinal canal: No prevertebral fluid or swelling. No visible canal hematoma. Disc levels: Multilevel degenerative disc disease, most severe at C4-C5, C5-C6 and C6-C7. Mild multilevel facet arthropathy. Upper chest: Unremarkable. Other: Mild mucosal thickening in the left sphenoid sinus. IMPRESSION: 1. No acute radiographic  abnormality of the cervical spine. 2. Multilevel degenerative disc disease and cervical spondylosis, as above. Electronically Signed   By: Vinnie Langton M.D.   On: 08/31/2019 08:48   Dg Pelvis Portable  Result Date: 08/31/2019 CLINICAL DATA:  Found unresponsive EXAM: PORTABLE PELVIS 1-2 VIEWS COMPARISON:  07/25/2019 FINDINGS: Pelvic ring is intact. Prior right hip replacement and surgical fixation of the proximal left femur are seen and stable. No fracture or hardware failure is noted. Degenerative changes of lumbar spine are seen. IMPRESSION: Postsurgical changes without acute abnormality. Electronically Signed   By: Inez Catalina M.D.   On: 08/31/2019 08:18   Dg Chest Port 1 View  Result Date: 09/01/2019 CLINICAL DATA:  Sepsis, NOS EXAM: PORTABLE CHEST 1 VIEW COMPARISON:  CT 08/31/2019, radiograph 08/31/2019 FINDINGS: Support devices overlie the chest. Lung volumes are diminished with new bandlike areas of atelectasis throughout both lungs. Furthermore there is increasing hazy interstitial opacity with some cephalization of the pulmonary vascularity and fissural and septal thickening to suggest developing pulmonary edema.  There is gradient opacity in the left lung base which could reflect a layering effusion. Cardiomediastinal contours are are similar to priors accounting for differences technique including a calcified, tortuous aorta. No acute osseous or soft tissue abnormality. Remote posttraumatic deformity of the left humeral head. Severe dextrocurvature of the thoracic spine. IMPRESSION: 1. Low lung volumes with increasing bandlike areas of atelectasis throughout both lungs. 2. Radiographic features suggest some developing interstitial edema and layering left effusion. 3. Large hiatal hernia. 4.  Aortic Atherosclerosis (ICD10-I70.0). Electronically Signed   By: Lovena Le M.D.   On: 09/01/2019 05:46   Dg Chest Portable 1 View  Result Date: 08/31/2019 CLINICAL DATA:  Found unresponsive EXAM:  PORTABLE CHEST 1 VIEW COMPARISON:  07/26/2019 FINDINGS: Cardiac shadow is mildly prominent but accentuated by the portable technique. Aortic calcifications are again noted and stable. The lungs are well aerated bilaterally. Very mild increased density is noted in the left base likely related atelectasis although the possibility of aspiration cannot be totally excluded. Hiatal hernia is again noted and stable. Old left humeral fracture is noted. IMPRESSION: Mild increased density in the left base which may represent atelectasis or possible aspiration. Electronically Signed   By: Inez Catalina M.D.   On: 08/31/2019 08:17   Vas [place] Lower Extremity Venous (dvt) (only [facility])  Result Date: 08/31/2019  Lower Venous Study Indications: Swelling, and Edema.  Limitations: Poor ultrasound/tissue interface. Comparison Study: no prior Performing Technologist: Abram Sander RVS  Examination Guidelines: A complete evaluation includes B-mode imaging, spectral Doppler, color Doppler, and power Doppler as needed of all accessible portions of each vessel. Bilateral testing is considered an integral part of a complete examination. Limited examinations for reoccurring indications may be performed as noted.  +---------+---------------+---------+-----------+----------+--------------+  RIGHT     Compressibility Phasicity Spontaneity Properties Thrombus Aging  +---------+---------------+---------+-----------+----------+--------------+  CFV       Full            Yes       Yes                                    +---------+---------------+---------+-----------+----------+--------------+  SFJ       Full                                                             +---------+---------------+---------+-----------+----------+--------------+  FV Prox   Full                                                             +---------+---------------+---------+-----------+----------+--------------+  FV Mid    Full                                                              +---------+---------------+---------+-----------+----------+--------------+  FV Distal Full                                                             +---------+---------------+---------+-----------+----------+--------------+  PFV       Full                                                             +---------+---------------+---------+-----------+----------+--------------+  POP       Full            Yes       Yes                                    +---------+---------------+---------+-----------+----------+--------------+  PTV                                                        Not visualized  +---------+---------------+---------+-----------+----------+--------------+  PERO                                                       Not visualized  +---------+---------------+---------+-----------+----------+--------------+   +---------+---------------+---------+-----------+----------+------------------+  LEFT      Compressibility Phasicity Spontaneity Properties Thrombus Aging      +---------+---------------+---------+-----------+----------+------------------+  CFV       Full            Yes       Yes                                        +---------+---------------+---------+-----------+----------+------------------+  SFJ       Full                                                                 +---------+---------------+---------+-----------+----------+------------------+  FV Prox   Full                                                                 +---------+---------------+---------+-----------+----------+------------------+  FV Mid                    Yes       Yes                    limited  visualization       +---------+---------------+---------+-----------+----------+------------------+  FV Distal                 Yes       Yes                    limited                                                                          visualization       +---------+---------------+---------+-----------+----------+------------------+  PFV       Full                                                                 +---------+---------------+---------+-----------+----------+------------------+  POP       Full            Yes       Yes                                        +---------+---------------+---------+-----------+----------+------------------+  PTV                                                        Not visualized      +---------+---------------+---------+-----------+----------+------------------+  PERO                                                       Not visualized      +---------+---------------+---------+-----------+----------+------------------+     Summary: Right: There is no evidence of deep vein thrombosis in the lower extremity. No cystic structure found in the popliteal fossa. Left: There is no evidence of deep vein thrombosis in the lower extremity. However, portions of this examination were limited- see technologist comments above. No cystic structure found in the popliteal fossa.  *See table(s) above for measurements and observations.    Preliminary       LOS: 1 day   Time spent: More than 50% of that time was spent in counseling and/or coordination of care.  Antonieta Pert, MD [facility]  09/01/2019, 8:35 AM

## 2019-09-01 NOTE — Progress Notes (Signed)
Pt has not voided in past 6 hours.  Bladder scan indicated that approx 329cc in bladder.  Called elink to advise.  Will continue to monitor closely

## 2019-09-02 DIAGNOSIS — I10 Essential (primary) hypertension: Secondary | ICD-10-CM

## 2019-09-02 DIAGNOSIS — N3000 Acute cystitis without hematuria: Secondary | ICD-10-CM

## 2019-09-02 DIAGNOSIS — R945 Abnormal results of liver function studies: Secondary | ICD-10-CM

## 2019-09-02 LAB — COMPREHENSIVE METABOLIC PANEL
ALT: 362 U/L — ABNORMAL HIGH (ref 0–44)
AST: 354 U/L — ABNORMAL HIGH (ref 15–41)
Albumin: 2 g/dL — ABNORMAL LOW (ref 3.5–5.0)
Alkaline Phosphatase: 67 U/L (ref 38–126)
Anion gap: 8 (ref 5–15)
BUN: 31 mg/dL — ABNORMAL HIGH (ref 8–23)
CO2: 21 mmol/L — ABNORMAL LOW (ref 22–32)
Calcium: 7.7 mg/dL — ABNORMAL LOW (ref 8.9–10.3)
Chloride: 110 mmol/L (ref 98–111)
Creatinine, Ser: 1.66 mg/dL — ABNORMAL HIGH (ref 0.44–1.00)
GFR calc Af Amer: 32 mL/min — ABNORMAL LOW (ref >60)
GFR calc non Af Amer: 28 mL/min — ABNORMAL LOW (ref >60)
Glucose, Bld: 94 mg/dL (ref 70–99)
Potassium: 4.1 mmol/L (ref 3.5–5.1)
Sodium: 139 mmol/L (ref 135–145)
Total Bilirubin: 0.4 mg/dL (ref 0.3–1.2)
Total Protein: 3.8 g/dL — ABNORMAL LOW (ref 6.5–8.1)

## 2019-09-02 LAB — CBC
HCT: 30.8 % — ABNORMAL LOW (ref 36.0–46.0)
Hemoglobin: 9.7 g/dL — ABNORMAL LOW (ref 12.0–15.0)
MCH: 28.6 pg (ref 26.0–34.0)
MCHC: 31.5 g/dL (ref 30.0–36.0)
MCV: 90.9 fL (ref 80.0–100.0)
Platelets: 170 10*3/uL (ref 150–400)
RBC: 3.39 MIL/uL — ABNORMAL LOW (ref 3.87–5.11)
RDW: 14.5 % (ref 11.5–15.5)
WBC: 14.1 10*3/uL — ABNORMAL HIGH (ref 4.0–10.5)
nRBC: 0 % (ref 0.0–0.2)

## 2019-09-02 LAB — HEPATITIS PANEL, ACUTE
HCV Ab: <0.1 s/co ratio (ref 0.0–0.9)
Hep A IgM: NEGATIVE
Hep B C IgM: NEGATIVE
Hepatitis B Surface Ag: NEGATIVE

## 2019-09-02 LAB — URINE CULTURE: Culture: >=100000 — AB

## 2019-09-02 LAB — GLUCOSE, CAPILLARY
Glucose-Capillary: 88 mg/dL (ref 70–99)
Glucose-Capillary: 89 mg/dL (ref 70–99)
Glucose-Capillary: 119 mg/dL — ABNORMAL HIGH (ref 70–99)
Glucose-Capillary: 156 mg/dL — ABNORMAL HIGH (ref 70–99)
Glucose-Capillary: 99 mg/dL (ref 70–99)

## 2019-09-02 LAB — CK: Total CK: 8700 U/L — ABNORMAL HIGH (ref 38–234)

## 2019-09-02 MED ORDER — SODIUM CHLORIDE 0.9 % IV SOLN
INTRAVENOUS | Status: DC
  Administered 2019-09-02: 08:00:00 150 mL/h via INTRAVENOUS
  Administered 2019-09-02 – 2019-09-05 (×4): via INTRAVENOUS
  Administered 2019-09-08: 75 mL/h via INTRAVENOUS
  Administered 2019-09-09 – 2019-09-15 (×9): via INTRAVENOUS

## 2019-09-02 MED ORDER — OXYCODONE HCL 5 MG PO TABS
10.0000 mg | ORAL_TABLET | Freq: Three times a day (TID) | ORAL | Status: DC | PRN
  Administered 2019-09-02 – 2019-09-06 (×11): 10 mg via ORAL
  Filled 2019-09-02 (×11): qty 2

## 2019-09-02 NOTE — Progress Notes (Signed)
OT Cancellation Note  Patient Details Name: Angelica Bell MRN: [id number] DOB: February 09, 1932   Cancelled Treatment:    Reason Eval/Treat Not Completed: Medical issues which prohibited therapy. Pt with non displaced fx involving L pubic bone and age indeterminant L acetabular fx. Will await medical plan and weight bearing status.   Malka So 09/02/2019, 1:02 PM  Nestor Lewandowsky, OTR/L Acute Rehabilitation Services Pager: [phone] Office: [phone]

## 2019-09-02 NOTE — Consult Note (Signed)
Reason for Consult:Pelvic fxs Referring Physician: [name] is an 85 y.o. female.  HPI: Angelica Bell fell in her bathroom on 9/1. She was found nearly unresponsive and was brought to the ED and admitted to critical care. She c/o left hip pain once she was coherent and subsequent workup showed left pubis and acetabular fxs and orthopedic surgery was consulted. She c/o localized pain to that area today but says the pain is severe.  Past Medical History:  Diagnosis Date  . Chronic back pain    with degenerative joint disease  . Chronic pain disorder    with narcotic management  . Dry eyes   . Hypertension   . Osteoporosis   . PE (pulmonary embolism)    After hysterectomy in 1967  . Pneumonia     Past Surgical History:  Procedure Laterality Date  . ABDOMINAL HYSTERECTOMY    . APPENDECTOMY    . BREAST ENHANCEMENT SURGERY    . CATARACT EXTRACTION    . FEMUR IM NAIL Left 02/17/2017   Procedure: INTRAMEDULLARY (IM) NAIL FEMORAL;  Surgeon: Nicholes Stairs, MD;  Location: [facility];  Service: Orthopedics;  Laterality: Left;  . HIP ARTHROPLASTY Right 12/24/2015   Procedure: ARTHROPLASTY BIPOLAR HIP (HEMIARTHROPLASTY);  Surgeon: Netta Cedars, MD;  Location: [facility];  Service: Orthopedics;  Laterality: Right;  . TONSILLECTOMY      Family History  Problem Relation Age of Onset  . Heart Problems Mother     Social History:  reports that she quit smoking about 43 years ago. She quit after 13.00 years of use. She has never used smokeless tobacco. She reports that she does not drink alcohol or use drugs.  Allergies:  Allergies  Allergen Reactions  . Hctz [Hydrochlorothiazide]     Significant and rapid hyponatremia (TIH)  . Amoxicillin-Pot Clavulanate Hives and Diarrhea    Has patient had a PCN reaction causing immediate rash, facial/tongue/throat swelling, SOB or lightheadedness with hypotension: yes Has patient had a PCN reaction causing severe rash involving mucus membranes or  skin necrosis: yes Has patient had a PCN reaction that required hospitalization : unknown Has patient had a PCN reaction occurring within the last 10 years: yes If all of the above answers are "NO", then may proceed with Cephalosporin use.   Marland Kitchen Lisinopril-Hydrochlorothiazide Other (See Comments)    Tired- no energy to do anything..  . Sulfa Antibiotics Hives and Nausea And Vomiting  . Sulfonamide Derivatives Hives    Medications: I have reviewed the patient's current medications.  Results for orders placed or performed during the hospital encounter of 08/31/19 (from the past 48 hour(s))  Glucose, capillary     Status: Abnormal   Collection Time: 08/31/19  2:14 PM  Result Value Ref Range   Glucose-Capillary 152 (H) 70 - 99 mg/dL  MRSA PCR Screening     Status: None   Collection Time: 08/31/19  2:28 PM   Specimen: Nasal Mucosa; Nasopharyngeal  Result Value Ref Range   MRSA by PCR NEGATIVE NEGATIVE    Comment:        The GeneXpert MRSA Assay (FDA approved for NASAL specimens only), is one component of a comprehensive MRSA colonization surveillance program. It is not intended to diagnose MRSA infection nor to guide or monitor treatment for MRSA infections. Performed at [facility], [place] [address], [place], [place] [address]   Glucose, capillary     Status: Abnormal   Collection Time: 08/31/19  5:17 PM  Result  Value Ref Range   Glucose-Capillary 147 (H) 70 - 99 mg/dL  CBC     Status: Abnormal   Collection Time: 09/01/19  2:36 AM  Result Value Ref Range   WBC 24.5 (H) 4.0 - 10.5 K/uL   RBC 3.94 3.87 - 5.11 MIL/uL   Hemoglobin 11.2 (L) 12.0 - 15.0 g/dL   HCT 36.7 36.0 - 46.0 %   MCV 93.1 80.0 - 100.0 fL   MCH 28.4 26.0 - 34.0 pg   MCHC 30.5 30.0 - 36.0 g/dL   RDW 14.0 11.5 - 15.5 %   Platelets 242 150 - 400 K/uL   nRBC 0.0 0.0 - 0.2 %    Comment: Performed at [facility], [place] [address] [place], [place] [id number]  Basic metabolic panel     Status: Abnormal    Collection Time: 09/01/19  2:36 AM  Result Value Ref Range   Sodium 138 135 - 145 mmol/L   Potassium 4.7 3.5 - 5.1 mmol/L   Chloride 106 98 - 111 mmol/L   CO2 21 (L) 22 - 32 mmol/L   Glucose, Bld 112 (H) 70 - 99 mg/dL   BUN 34 (H) 8 - 23 mg/dL   Creatinine, Ser 1.82 (H) 0.44 - 1.00 mg/dL   Calcium 7.6 (L) 8.9 - 10.3 mg/dL   GFR calc non Af Amer 25 (L) >60 mL/min   GFR calc Af Amer 29 (L) >60 mL/min   Anion gap 11 5 - 15    Comment: Performed at [facility] [address] [place], [place] [address]  Magnesium     Status: None   Collection Time: 09/01/19  2:36 AM  Result Value Ref Range   Magnesium 1.7 1.7 - 2.4 mg/dL    Comment: Performed at [facility] [address], [place], [place] [address]  Phosphorus     Status: Abnormal   Collection Time: 09/01/19  2:36 AM  Result Value Ref Range   Phosphorus 4.7 (H) 2.5 - 4.6 mg/dL    Comment: Performed at [facility] [address] [place], [place] [address]  Hepatic function panel     Status: Abnormal   Collection Time: 09/01/19  2:36 AM  Result Value Ref Range   Total Protein 4.3 (L) 6.5 - 8.1 g/dL   Albumin 2.3 (L) 3.5 - 5.0 g/dL   AST 666 (H) 15 - 41 U/L   ALT 502 (H) 0 - 44 U/L   Alkaline Phosphatase 90 38 - 126 U/L   Total Bilirubin 0.4 0.3 - 1.2 mg/dL   Bilirubin, Direct 0.2 0.0 - 0.2 mg/dL   Indirect Bilirubin 0.2 (L) 0.3 - 0.9 mg/dL    Comment: Performed at [facility] [address], [place],  [address]  Hepatitis panel, acute     Status: None   Collection Time: 09/01/19  6:42 AM  Result Value Ref Range   Hepatitis B Surface Ag Negative Negative   HCV Ab <0.1 0.0 - 0.9 s/co ratio    Comment: (NOTE)                                  Negative:     < 0.8                             Indeterminate: 0.8 - 0.9  Positive:     > 0.9 The CDC recommends that a positive HCV antibody result be followed up with a HCV Nucleic Acid Amplification test  [id number]). Performed At: [facility] [place], [place] [id number] Rush Farmer MD [id number]    Hep A IgM Negative Negative   Hep B C IgM Negative Negative  CK     Status: Abnormal   Collection Time: 09/01/19  6:42 AM  Result Value Ref Range   Total CK 18,996 (H) 38 - 234 U/L    Comment: RESULTS CONFIRMED BY MANUAL DILUTION Performed at [facility], [place] [address], [place], [place] [address]   Glucose, capillary     Status: Abnormal   Collection Time: 09/01/19 11:39 AM  Result Value Ref Range   Glucose-Capillary 149 (H) 70 - 99 mg/dL  Glucose, capillary     Status: Abnormal   Collection Time: 09/01/19  5:42 PM  Result Value Ref Range   Glucose-Capillary 109 (H) 70 - 99 mg/dL  Glucose, capillary     Status: Abnormal   Collection Time: 09/01/19 11:48 PM  Result Value Ref Range   Glucose-Capillary 106 (H) 70 - 99 mg/dL  CK     Status: Abnormal   Collection Time: 09/02/19  2:41 AM  Result Value Ref Range   Total CK 8,700 (H) 38 - 234 U/L    Comment: RESULTS CONFIRMED BY MANUAL DILUTION Performed at [facility], [place] [address] [place], [place] [address]   Comprehensive metabolic panel     Status: Abnormal   Collection Time: 09/02/19  2:41 AM  Result Value Ref Range   Sodium 139 135 - 145 mmol/L   Potassium 4.1 3.5 - 5.1 mmol/L   Chloride 110 98 - 111 mmol/L   CO2 21 (L) 22 - 32 mmol/L   Glucose, Bld 94 70 - 99 mg/dL   BUN 31 (H) 8 - 23 mg/dL   Creatinine, Ser 1.66 (H) 0.44 - 1.00 mg/dL   Calcium 7.7 (L) 8.9 - 10.3 mg/dL   Total Protein 3.8 (L) 6.5 - 8.1 g/dL   Albumin 2.0 (L) 3.5 - 5.0 g/dL   AST 354 (H) 15 - 41 U/L   ALT 362 (H) 0 - 44 U/L   Alkaline Phosphatase 67 38 - 126 U/L   Total Bilirubin 0.4 0.3 - 1.2 mg/dL   GFR calc non Af Amer 28 (L) >60 mL/min   GFR calc Af Amer 32 (L) >60 mL/min   Anion gap 8 5 - 15    Comment: Performed at [facility], [place] [address], [place], [place] [address]  CBC     Status: Abnormal    Collection Time: 09/02/19  2:41 AM  Result Value Ref Range   WBC 14.1 (H) 4.0 - 10.5 K/uL   RBC 3.39 (L) 3.87 - 5.11 MIL/uL   Hemoglobin 9.7 (L) 12.0 - 15.0 g/dL   HCT 30.8 (L) 36.0 - 46.0 %   MCV 90.9 80.0 - 100.0 fL   MCH 28.6 26.0 - 34.0 pg   MCHC 31.5 30.0 - 36.0 g/dL   RDW 14.5 11.5 - 15.5 %   Platelets 170 150 - 400 K/uL   nRBC 0.0 0.0 - 0.2 %    Comment: Performed at [facility], [place] [address], [place], [place] [address]  Glucose, capillary     Status: None   Collection Time: 09/02/19  6:21 AM  Result Value Ref Range   Glucose-Capillary  88 70 - 99 mg/dL  Glucose, capillary     Status: None   Collection Time: 09/02/19  7:05 AM  Result Value Ref Range   Glucose-Capillary 89 70 - 99 mg/dL  Glucose, capillary     Status: Abnormal   Collection Time: 09/02/19 11:18 AM  Result Value Ref Range   Glucose-Capillary 119 (H) 70 - 99 mg/dL    Dg Pelvis 1-2 Views  Result Date: 09/01/2019 CLINICAL DATA:  Left hip pain post fall. EXAM: PELVIS - 1-2 VIEW COMPARISON:  August 31, 2019 FINDINGS: Limited single view of the left hip, with internal rotation of the left femur. Post intramedullary nail fixation of the left femur. No evidence of femoral head fracture. Fracture along the greater trochanter is difficult to exclude. Prior total right hip arthroplasty, with intact components. IMPRESSION: 1. Limited single view of the left hip, with internal rotation of the left femur. 2. Fracture along the greater trochanter is difficult to exclude. 3. Hardware intact. Electronically Signed   By: Fidela Salisbury M.D.   On: 09/01/2019 16:04   Ct Hip Left Wo Contrast  Result Date: 09/01/2019 CLINICAL DATA:  Hip pain. EXAM: CT OF THE LEFT HIP WITHOUT CONTRAST TECHNIQUE: Multidetector CT imaging of the left hip was performed according to the standard protocol. Multiplanar CT image reconstructions were also generated. COMPARISON:  X-ray from the same day.  CT dated August 31, 2019. FINDINGS:  Bones/Joint/Cartilage The patient is status post placement of an intramedullary nail for a known intratrochanteric fracture of the proximal left femur. This fracture is healed. The hardware is intact. There is a nondisplaced fracture involving the parasymphyseal left pubic bone. There is an age-indeterminate fracture through the anterior column of the left acetabulum. The patient's femoral screw nearly extends beyond the cortex into the joint space. Ligaments Suboptimally assessed by CT. Muscles and Tendons There is some edema extending through the myofascial planes of the left lower extremity. Soft tissues There is nonspecific lower extremity edema involving the left lower extremity. A rectal tube is in place. A Foley catheter is in place. IMPRESSION: 1. Nondisplaced fracture of the left parasymphyseal pubic bone. 2. Age-indeterminate nondisplaced fracture involving the anterior column of the left acetabulum. The appearance favors a subacute fracture. 3. Stable orthopedic hardware involving the left lower extremity. 4. Nonspecific soft tissue swelling about the left lower extremity. Electronically Signed   By: Constance Holster M.D.   On: 09/01/2019 21:12   Dg Chest Port 1 View  Result Date: 09/01/2019 CLINICAL DATA:  Sepsis, NOS EXAM: PORTABLE CHEST 1 VIEW COMPARISON:  CT 08/31/2019, radiograph 08/31/2019 FINDINGS: Support devices overlie the chest. Lung volumes are diminished with new bandlike areas of atelectasis throughout both lungs. Furthermore there is increasing hazy interstitial opacity with some cephalization of the pulmonary vascularity and fissural and septal thickening to suggest developing pulmonary edema. There is gradient opacity in the left lung base which could reflect a layering effusion. Cardiomediastinal contours are are similar to priors accounting for differences technique including a calcified, tortuous aorta. No acute osseous or soft tissue abnormality. Remote posttraumatic deformity of  the left humeral head. Severe dextrocurvature of the thoracic spine. IMPRESSION: 1. Low lung volumes with increasing bandlike areas of atelectasis throughout both lungs. 2. Radiographic features suggest some developing interstitial edema and layering left effusion. 3. Large hiatal hernia. 4.  Aortic Atherosclerosis (ICD10-I70.0). Electronically Signed   By: Lovena Le M.D.   On: 09/01/2019 05:46   Dg Femur Min 2 Views Left  Result  Date: 09/01/2019 CLINICAL DATA:  Left hip pain. EXAM: LEFT FEMUR 2 VIEWS COMPARISON:  Left femur x-rays dated July 26, 2019. FINDINGS: No acute fracture or dislocation. Healed left intertrochanteric fracture status post gamma nail fixation. No evidence of hardware failure or loosening. The left hip joint space is preserved. Osteopenia. Soft tissues are unremarkable. IMPRESSION: 1.  No acute osseous abnormality. 2. Healed left intertrochanteric fracture status post ORIF. Electronically Signed   By: Titus Dubin M.D.   On: 09/01/2019 16:02   Vas [place] Lower Extremity Venous (dvt) (only [facility])  Result Date: 09/01/2019  Lower Venous Study Indications: Swelling, and Edema.  Limitations: Poor ultrasound/tissue interface. Comparison Study: no prior Performing Technologist: Abram Sander RVS  Examination Guidelines: A complete evaluation includes B-mode imaging, spectral Doppler, color Doppler, and power Doppler as needed of all accessible portions of each vessel. Bilateral testing is considered an integral part of a complete examination. Limited examinations for reoccurring indications may be performed as noted.  +---------+---------------+---------+-----------+----------+--------------+ RIGHT    CompressibilityPhasicitySpontaneityPropertiesThrombus Aging +---------+---------------+---------+-----------+----------+--------------+ CFV      Full           Yes      Yes                                 +---------+---------------+---------+-----------+----------+--------------+  SFJ      Full                                                        +---------+---------------+---------+-----------+----------+--------------+ FV Prox  Full                                                        +---------+---------------+---------+-----------+----------+--------------+ FV Mid   Full                                                        +---------+---------------+---------+-----------+----------+--------------+ FV DistalFull                                                        +---------+---------------+---------+-----------+----------+--------------+ PFV      Full                                                        +---------+---------------+---------+-----------+----------+--------------+ POP      Full           Yes      Yes                                 +---------+---------------+---------+-----------+----------+--------------+ PTV  Not visualized +---------+---------------+---------+-----------+----------+--------------+ PERO                                                  Not visualized +---------+---------------+---------+-----------+----------+--------------+   +---------+---------------+---------+-----------+----------+------------------+ LEFT     CompressibilityPhasicitySpontaneityPropertiesThrombus Aging     +---------+---------------+---------+-----------+----------+------------------+ CFV      Full           Yes      Yes                                     +---------+---------------+---------+-----------+----------+------------------+ SFJ      Full                                                            +---------+---------------+---------+-----------+----------+------------------+ FV Prox  Full                                                            +---------+---------------+---------+-----------+----------+------------------+ FV Mid                   Yes      Yes                  limited                                                                  visualization      +---------+---------------+---------+-----------+----------+------------------+ FV Distal               Yes      Yes                  limited                                                                  visualization      +---------+---------------+---------+-----------+----------+------------------+ PFV      Full                                                            +---------+---------------+---------+-----------+----------+------------------+ POP      Full           Yes      Yes                                     +---------+---------------+---------+-----------+----------+------------------+  PTV                                                   Not visualized     +---------+---------------+---------+-----------+----------+------------------+ PERO                                                  Not visualized     +---------+---------------+---------+-----------+----------+------------------+     Summary: Right: There is no evidence of deep vein thrombosis in the lower extremity. No cystic structure found in the popliteal fossa. Left: There is no evidence of deep vein thrombosis in the lower extremity. However, portions of this examination were limited- see technologist comments above. No cystic structure found in the popliteal fossa.  *See table(s) above for measurements and observations. Electronically signed by Curt Jews MD on 09/01/2019 at 4:40:52 PM.    Final     Review of Systems  Constitutional: Negative for weight loss.  HENT: Negative for ear discharge, ear pain, hearing loss and tinnitus.   Eyes: Negative for blurred vision, double vision, photophobia and pain.  Respiratory: Negative for cough, sputum production and shortness of breath.   Cardiovascular: Negative for chest pain.  Gastrointestinal:  Negative for abdominal pain, nausea and vomiting.  Genitourinary: Negative for dysuria, flank pain, frequency and urgency.  Musculoskeletal: Positive for joint pain (Left hip). Negative for back pain, falls, myalgias and neck pain.  Neurological: Negative for dizziness, tingling, sensory change, focal weakness, loss of consciousness and headaches.  Endo/Heme/Allergies: Does not bruise/bleed easily.  Psychiatric/Behavioral: Negative for depression, memory loss and substance abuse. The patient is not nervous/anxious.    Blood pressure 119/66, pulse 69, temperature 98 F (36.7 C), temperature source Oral, resp. rate (!) 21, height 5\' 6"  (1.676 m), weight 53.4 kg, SpO2 94 %. Physical Exam  Constitutional: She appears well-developed and well-nourished. No distress.  HENT:  Head: Normocephalic and atraumatic.  Eyes: Conjunctivae are normal. Right eye exhibits no discharge. Left eye exhibits no discharge. No scleral icterus.  Neck: Normal range of motion.  Cardiovascular: Normal rate and regular rhythm.  Respiratory: Effort normal. No respiratory distress.  Musculoskeletal:     Comments: LLE No traumatic wounds, ecchymosis, or rash  Mod TTP  No knee or ankle effusion  Knee stable to varus/ valgus and anterior/posterior stress  Sens DPN paresthetic, SPN, TN intact  Motor EHL, ext, flex, evers 5/5  DP 1+, PT 1+, No significant edema  Neurological: She is alert.  Skin: Skin is warm and dry. She is not diaphoretic.  Psychiatric: She has a normal mood and affect. Her behavior is normal.    Assessment/Plan: Left pelvic fxs -- She may be WBAT on these but I imagine the pain will restrict her significantly. Will order PT/OT. She should f/u with Dr. Stann Mainland after discharge. Multiple medical problems including CVA, HTN, OA, and chronic pain -- per primary team    Lisette Abu, PA-C Orthopedic Surgery [phone] 09/02/2019, 1:57 PM

## 2019-09-02 NOTE — Progress Notes (Signed)
Patient transferred to room [phone] with her belongings and her dentures.

## 2019-09-02 NOTE — Progress Notes (Signed)
PROGRESS NOTE    Angelica Bell  [id number] DOB: November 22, 1932 DOA: 08/31/2019 PCP: Heywood Bene, PA-C   Brief Narrative: 85 year old female with past medical history of prior CVA, hypertension, arthritis, chronic pain most recently discharged 8/8 following acute CVA, dehydration, rhabdomyolysis acute renal failure who was found down at home by the grand son. Patinet was back home from SNF last thursday , was found on floor on Friday morning by grandson. He went to check onTuesday morning (9/1) and found her on floor and he called EMS and she was brought to ER. Apparently fell in bathroom, took over an hour to get her bearings and dragged herself out toward phone.Patient responded very well to narcan and narcan drip.  She did not respond to fluid resuscitation in terms of Bps so PCCM asked to admit for hypotension. She also had  rhabdo,aki, lactic acidosis, borderline UA, leucocytosis CT head no acute finding. Utox with benzos and opiates. Not sure where benzodiazepines came from but note history of taking too many xanax back in 2012. Per PCCM overall impression here is she is over-medicated at home, fell, and had rhabdo/AKI from prolonged downtime and poor PO.  Also has fecal impaction, deconditioning, elevated ammonia and AST/ALT pattern that could be indicative of EtOH use although she denies.  Shock seems more hypovolemic given response to fluids.  Questionable UTI but I guess will treat with AMS, leukocytosis, and UA, d/c abx if Pct < 0.5.Patient's blood pressure improved and she was transferred to [facility] service 9/3.  Subjective: C/o Left hip pain no new complaints Having loose stool after laxatives- needing flexiseal. deniss chest pain nausea, vomiting  Assessment & Plan:  Acute toxic, possibly metabolic Encephalopathy, secondary to UTI metabolic abnormalities,opiates: Mental status overall improved. Cont pt/ot. AnticipatingSNF placement. patient was initially given Narcan drip-she  is on oxycodone 30 mg 3 times a day as needed at home- start 10 mg q8 hr prn. Cont Lexapro.  Rhabdomyolysis likely from laying on floor:CK uptrended from 3k-->18k on 9/2- ivf increased to 126ml/hr and Ck down to 8700. Cont ivf NSS at 125 ml /hr. Hold statins for now  SIRS/Leucocytosis:from UTI.  CT chest/abd with constipation and bronchiectatic changes, leukocytosis downtrending, afebrile.  Continue on cefepime as Urine growing Pseudomonas, off vancomycin continue hydration.  Blood Culture no growth so far.  Pseudomonas UTI: Reviewed culture sensitivity.  Continue cefepime Leukocytosis downtrending. Recent Labs  Lab 08/31/19 0841 09/01/19 0236 09/02/19 0241  WBC 29.8* 24.5* 14.1*   Hypotension/shock likely volume depletion/related to opiate?Marland Kitchen Off pressors. BP stable, continue ivf. Blood culture  NGTD.  AKI: In the setting of hypotension, rhabdomyolysis.  Creatinine improving nicely on ivf, cont NSS, encouraged po.Hold home NSAIDs monitor intake output. Recent Labs  Lab 08/31/19 0741 08/31/19 0753 09/01/19 0236 09/02/19 0241  BUN 27* 39* 34* 31*  CREATININE 2.42* 2.20* 1.82* 1.66*   Constipation:resolved, having diarrhea needing flexiseal- d.c miralax, lactulose. constipation likely from narcotics  Transaminitis/Elevated Ammonia:bump overnight on 9/1. pending viral hep panel.  CT abd 9/1 normal Hepatobiliary: "No focal liver abnormality is seen. No gallstones, gallbladder wall thickening, or biliary dilatation. Pancreas: Unremarkable. No pancreatic ductal dilatation or surrounding inflammatory changes", elevated LFTs likely from hypotension/rhabdomyolysis and now improving- avoid hepatotoxic medication, cont to hold Lipitor. Serial lfts.  Recent Labs  Lab 08/31/19 0741 09/01/19 0236 09/02/19 0241  AST 118* 666* 354*  ALT 64* 502* 362*  ALKPHOS 88 90 67  BILITOT 0.5 0.4 0.4  PROT 6.4* 4.3* 3.8*  ALBUMIN 3.7 2.3*  2.0*  INR 1.2  --   --    Left hip pain/bruise:CT obtained  9/2-showed "Nondisplaced fracture of the left parasymphyseal pubic bone.Age-indeterminate nondisplaced fracture involving the anterior column of the left acetabulum.-likely subacute". Add prn oxy for pain, PT/OT, ortho consulted and spoke w Michael-continue WBAT and outpatient follow-up with Dr. Stann Mainland  Hx of CVA: Resume aspirin hold Lipitor due to elevated LFTs, PT OT  Hx of hiatal hernia/gerd:cont ppi.  DM, hba1c 5.9 on 7/19- cont ssi.  DVT prophylaxis:Heparin Code Status: Full code Family Communication: plan of care discussed with patient in detail. Updated Mr Case, patient's grandson. He is agreeable for SNF once stable. Disposition Plan: Remains inpatient pending clinical improvement. Will need SNF. Okay to downgrade to med-surg.  Consultants:  CCM  Procedures: CT brain 9/1:Evolutionary findings in the small white matter infarcts in the left occipital lobe right parietal lobe, currently hypodense indicating increased chronicity. Both of these were subacute at the time of prior brain MRI of 07/27/2019. 2. Old right cerebellar infarct. 3. Periventricular white matter and corona radiata hypodensities favor chronic ischemic microvascular white matter disease. 4. No acute findings. CT chest abd/pelvis 9/1: Large sliding-type hiatal hernia.1.6 cm left thyroid nodule is noted. Thyroid ultrasound is recommended for further evaluation. Large amount of stool is seen in sigmoid colon and rectum concerning for possible impaction.Bibasilar bronchiectatic changes near the area of hiatal hernia   CT left hip9/2 IMPRESSION: 1. Nondisplaced fracture of the left parasymphyseal pubic bone. 2. Age-indeterminate nondisplaced fracture involving the anterior column of the left acetabulum. The appearance favors a subacute fracture. 3. Stable orthopedic hardware involving the left lower extremity. 4. Nonspecific soft tissue swelling about the left lower extremity.  Microbiology:  UC 9/1>>>  Pseudomonas BCX2 9/1>>>  Antimicrobials: Anti-infectives (From admission, onward)   Start     Dose/Rate Route Frequency Ordered Stop   09/01/19 0900  ceFEPIme (MAXIPIME) 2 g in sodium chloride 0.9 % 100 mL IVPB     2 g 200 mL/hr over 30 Minutes Intravenous Every 24 hours 08/31/19 0900     08/31/19 0900  vancomycin variable dose per unstable renal function (pharmacist dosing)  Status:  Discontinued      Does not apply See admin instructions 08/31/19 0900 09/01/19 0924   08/31/19 0800  ceFEPIme (MAXIPIME) 2 g in sodium chloride 0.9 % 100 mL IVPB     2 g 200 mL/hr over 30 Minutes Intravenous  Once 08/31/19 0747 08/31/19 0948   08/31/19 0800  metroNIDAZOLE (FLAGYL) IVPB 500 mg     500 mg 100 mL/hr over 60 Minutes Intravenous  Once 08/31/19 0747 08/31/19 1034   08/31/19 0800  vancomycin (VANCOCIN) IVPB 1000 mg/200 mL premix     1,000 mg 200 mL/hr over 60 Minutes Intravenous  Once 08/31/19 0747 08/31/19 1419       Objective: Vitals:   09/02/19 0400 09/02/19 0500 09/02/19 0600 09/02/19 0709  BP: 101/69 (!) 107/53 114/63   Pulse: 72 70 68   Resp: 18 19 (!) 23   Temp:    97.9 F (36.6 C)  TempSrc:    Oral  SpO2: 96% 94% 96%   Weight:  53.4 kg    Height:        Intake/Output Summary (Last 24 hours) at 09/02/2019 0824 Last data filed at 09/02/2019 0600 Gross per 24 hour  Intake 3238.91 ml  Output 1605 ml  Net 1633.91 ml   Filed Weights   08/31/19 0840 09/01/19 0437 09/02/19 0500  Weight: 55  kg 72.4 kg 53.4 kg   Weight change: -1.601 kg  Body mass index is 19 kg/m.  Intake/Output from previous day: 09/02 0701 - 09/03 0700 In: 3545.5 [P.O.:460; I.V.:2985.6; IV Piggyback:99.9] Out: 1765 [Urine:1265; Stool:500] Intake/Output this shift: No intake/output data recorded.  Examination:  General exam: Appears calm and comfortable,NAD HEENT:PERRL,Oral mucosa moist, Ear/Nose normal on gross exam Respiratory system: Bilateral equal air entry, normal vesicular breath sounds, no  wheezes or crackles  Cardiovascular system: S1 & S2 heard,No JVD, murmurs. Gastrointestinal system: Abdomen is  soft, non tender, non distended, BS +  Nervous System:Alert and oriented x2-3 Weaker let side?- b/l gen weakness Extremities: No edema, no clubbing, weak left side- bruise left hip, Skin: No rashes, lesions, no icterus MSK: Normal muscle bulk,tone ,power flexiseal +  Medications:  Scheduled Meds:  aspirin EC  81 mg Oral Daily   bisacodyl  10 mg Rectal Once   Chlorhexidine Gluconate Cloth  6 each Topical Daily   docusate sodium  100 mg Oral BID   escitalopram  20 mg Oral Daily   heparin  5,000 Units Subcutaneous Q8H   lactulose  30 g Per Tube TID   pantoprazole  40 mg Oral Daily   polyethylene glycol  17 g Oral Daily   Continuous Infusions:  sodium chloride 150 mL/hr (09/02/19 0800)   ceFEPime (MAXIPIME) IV 2 g (09/02/19 0806)   lactated ringers 150 mL/hr at 09/02/19 0542    Data Reviewed: I have personally reviewed following labs and imaging studies  CBC: Recent Labs  Lab 08/31/19 0753 08/31/19 0841 09/01/19 0236 09/02/19 0241  WBC  --  29.8* 24.5* 14.1*  NEUTROABS  --  25.9*  --   --   HGB 14.6 10.2* 11.2* 9.7*  HCT 43.0 34.7* 36.7 30.8*  MCV  --  98.0 93.1 90.9  PLT  --  236 242 [id number]   Basic Metabolic Panel: Recent Labs  Lab 08/31/19 0741 08/31/19 0753 09/01/19 0236 09/02/19 0241  NA 140 137 138 139  K 5.2* 5.1 4.7 4.1  CL 103 104 106 110  CO2 19*  --  21* 21*  GLUCOSE 108* 99 112* 94  BUN 27* 39* 34* 31*  CREATININE 2.42* 2.20* 1.82* 1.66*  CALCIUM 8.9  --  7.6* 7.7*  MG 2.3  --  1.7  --   PHOS 10.3*  --  4.7*  --    GFR: Estimated Creatinine Clearance: 20.9 mL/min (A) (by C-G formula based on SCr of 1.66 mg/dL (H)). Liver Function Tests: Recent Labs  Lab 08/31/19 0741 09/01/19 0236 09/02/19 0241  AST 118* 666* 354*  ALT 64* 502* 362*  ALKPHOS 88 90 67  BILITOT 0.5 0.4 0.4  PROT 6.4* 4.3* 3.8*  ALBUMIN 3.7 2.3* 2.0*     No results for input(s): LIPASE, AMYLASE in the last 168 hours. Recent Labs  Lab 08/31/19 0741  AMMONIA 64*   Coagulation Profile: Recent Labs  Lab 08/31/19 0741  INR 1.2   Cardiac Enzymes: Recent Labs  Lab 08/31/19 0741 09/01/19 0642 09/02/19 0241  CKTOTAL 3,175* 18,996* 8,700*   BNP (last 3 results) No results for input(s): PROBNP in the last 8760 hours. HbA1C: No results for input(s): HGBA1C in the last 72 hours. CBG: Recent Labs  Lab 09/01/19 1139 09/01/19 1742 09/01/19 2348 09/02/19 0621 09/02/19 0705  GLUCAP 149* 109* 106* 88 89   Lipid Profile: No results for input(s): CHOL, HDL, LDLCALC, TRIG, CHOLHDL, LDLDIRECT in the last 72 hours. Thyroid Function Tests: No  results for input(s): TSH, T4TOTAL, FREET4, T3FREE, THYROIDAB in the last 72 hours. Anemia Panel: No results for input(s): VITAMINB12, FOLATE, FERRITIN, TIBC, IRON, RETICCTPCT in the last 72 hours. Sepsis Labs: Recent Labs  Lab 08/31/19 0739 08/31/19 0741 08/31/19 0945  PROCALCITON  --  0.65  --   LATICACIDVEN 7.7*  --  3.5*    Recent Results (from the past 240 hour(s))  Blood Culture (routine x 2)     Status: None (Preliminary result)   Collection Time: 08/31/19  7:30 AM   Specimen: BLOOD  Result Value Ref Range Status   Specimen Description BLOOD LEFT ANTECUBITAL  Final   Special Requests   Final    BOTTLES DRAWN AEROBIC AND ANAEROBIC Blood Culture adequate volume   Culture   Final    NO GROWTH 1 DAY Performed at [facility], [place] [address] [place], [place] [address]    Report Status PENDING  Incomplete  Blood Culture (routine x 2)     Status: None (Preliminary result)   Collection Time: 08/31/19  7:39 AM   Specimen: BLOOD RIGHT FOREARM  Result Value Ref Range Status   Specimen Description BLOOD RIGHT FOREARM  Final   Special Requests   Final    BOTTLES DRAWN AEROBIC AND ANAEROBIC Blood Culture results may not be optimal due to an inadequate volume of blood received in  culture bottles   Culture   Final    NO GROWTH 1 DAY Performed at [facility], [place] [address], [place], [place] [address]    Report Status PENDING  Incomplete  SARS Coronavirus 2 [facility] order, Performed in [facility] hospital lab) Nasopharyngeal Nasopharyngeal Swab     Status: None   Collection Time: 08/31/19  8:00 AM   Specimen: Nasopharyngeal Swab  Result Value Ref Range Status   SARS Coronavirus 2 NEGATIVE NEGATIVE Final    Comment: (NOTE) If result is NEGATIVE SARS-CoV-2 target nucleic acids are NOT DETECTED. The SARS-CoV-2 RNA is generally detectable in upper and lower  respiratory specimens during the acute phase of infection. The lowest  concentration of SARS-CoV-2 viral copies this assay can detect is 250  copies / mL. A negative result does not preclude SARS-CoV-2 infection  and should not be used as the sole basis for treatment or other  patient management decisions.  A negative result may occur with  improper specimen collection / handling, submission of specimen other  than nasopharyngeal swab, presence of viral mutation(s) within the  areas targeted by this assay, and inadequate number of viral copies  (<250 copies / mL). A negative result must be combined with clinical  observations, patient history, and epidemiological information. If result is POSITIVE SARS-CoV-2 target nucleic acids are DETECTED. The SARS-CoV-2 RNA is generally detectable in upper and lower  respiratory specimens dur ing the acute phase of infection.  Positive  results are indicative of active infection with SARS-CoV-2.  Clinical  correlation with patient history and other diagnostic information is  necessary to determine patient infection status.  Positive results do  not rule out bacterial infection or co-infection with other viruses. If result is PRESUMPTIVE POSTIVE SARS-CoV-2 nucleic acids MAY BE PRESENT.   A presumptive positive result was obtained on the submitted specimen  and  confirmed on repeat testing.  While 2019 novel coronavirus  (SARS-CoV-2) nucleic acids may be present in the submitted sample  additional confirmatory testing may be necessary for epidemiological  and / or clinical management purposes  to differentiate between  SARS-CoV-2 and other Sarbecovirus currently known to infect humans.  If clinically indicated additional testing with an alternate test  methodology [phone]) is advised. The SARS-CoV-2 RNA is generally  detectable in upper and lower respiratory sp ecimens during the acute  phase of infection. The expected result is Negative. Fact Sheet for Patients:  StrictlyIdeas.no Fact Sheet for Healthcare Providers: BankingDealers.co.za This test is not yet approved or cleared by the [place] FDA and has been authorized for detection and/or diagnosis of SARS-CoV-2 by FDA under an Emergency Use Authorization (EUA).  This EUA will remain in effect (meaning this test can be used) for the duration of the COVID-19 declaration under Section 564(b)(1) of the Act, 21 U.S.C. section 360bbb-3(b)(1), unless the authorization is terminated or revoked sooner. Performed at [facility], [place] [address], [place], [place] [address]   Urine culture     Status: Abnormal   Collection Time: 08/31/19  9:20 AM   Specimen: In/Out Cath Urine  Result Value Ref Range Status   Specimen Description IN/OUT CATH URINE  Final   Special Requests   Final    NONE Performed at [facility], [place] [address], [place], [place] [address]    Culture >=100,000 COLONIES/mL PSEUDOMONAS AERUGINOSA (A)  Final   Report Status 09/02/2019 FINAL  Final   Organism ID, Bacteria PSEUDOMONAS AERUGINOSA (A)  Final      Susceptibility   Pseudomonas aeruginosa - MIC*    CEFTAZIDIME 2 SENSITIVE Sensitive     CIPROFLOXACIN >=4 RESISTANT Resistant     GENTAMICIN 4 SENSITIVE Sensitive     IMIPENEM <=0.25 SENSITIVE Sensitive      PIP/TAZO <=4 SENSITIVE Sensitive     CEFEPIME 2 SENSITIVE Sensitive     * >=100,000 COLONIES/mL PSEUDOMONAS AERUGINOSA  MRSA PCR Screening     Status: None   Collection Time: 08/31/19  2:28 PM   Specimen: Nasal Mucosa; Nasopharyngeal  Result Value Ref Range Status   MRSA by PCR NEGATIVE NEGATIVE Final    Comment:        The GeneXpert MRSA Assay (FDA approved for NASAL specimens only), is one component of a comprehensive MRSA colonization surveillance program. It is not intended to diagnose MRSA infection nor to guide or monitor treatment for MRSA infections. Performed at [facility], [place] [address], [place],  [address]       Radiology Studies: Ct Abdomen Pelvis Wo Contrast  Result Date: 08/31/2019 CLINICAL DATA:  Rib pain, possible acute mesenteric ischemia. EXAM: CT CHEST, ABDOMEN AND PELVIS WITHOUT CONTRAST TECHNIQUE: Multidetector CT imaging of the chest, abdomen and pelvis was performed following the standard protocol without IV contrast. COMPARISON:  None. FINDINGS: CT CHEST FINDINGS Cardiovascular: Atherosclerosis of thoracic aorta is noted without aneurysm formation. Dissection cannot be excluded due to the lack of intravenous contrast. Normal cardiac size. No pericardial effusion is noted. Mild coronary artery calcifications are noted. Mediastinum/Nodes: Large sliding-type hiatal hernia is noted. 16 mm left thyroid nodule is noted. No adenopathy is noted. Lungs/Pleura: Lungs are clear. No pleural effusion or pneumothorax. Musculoskeletal: No chest wall mass or suspicious bone lesions identified. CT ABDOMEN PELVIS FINDINGS Hepatobiliary: No focal liver abnormality is seen. No gallstones, gallbladder wall thickening, or biliary dilatation. Pancreas: Unremarkable. No pancreatic ductal dilatation or surrounding inflammatory changes. Spleen: Normal in size without focal abnormality. Adrenals/Urinary Tract: Adrenal glands and kidneys appear normal. No hydronephrosis or renal  obstruction is noted. No renal or ureteral calculi are noted. Evaluation of the bladder is  severely limited due to scatter artifact arising from bilateral hip surgical hardware. Stomach/Bowel: The stomach is unremarkable. There is no evidence of bowel obstruction or inflammation. The appendix is not clearly visualized. Large amount of stool is seen in the sigmoid colon and rectum concerning for possible impaction. Vascular/Lymphatic: Aortic atherosclerosis. No enlarged abdominal or pelvic lymph nodes. Due to the lack of intravenous contrast, mesenteric ischemia cannot be excluded on the basis of this exam. Reproductive: Status post hysterectomy. No adnexal masses. Other: No abdominal wall hernia or abnormality. No abdominopelvic ascites. Musculoskeletal: Status post right hip arthroplasty. Status post surgical repair of proximal left femoral fracture. No acute osseous abnormality is noted. IMPRESSION: Large sliding-type hiatal hernia. 1.6 cm left thyroid nodule is noted. Thyroid ultrasound is recommended for further evaluation. Large amount of stool is seen in sigmoid colon and rectum concerning for possible impaction. No definite acute abnormality is noted in the chest, abdomen or pelvis. Due to the lack of intravenous contrast, the patency of mesenteric vessels could not be assessed on this study. Aortic Atherosclerosis (ICD10-I70.0). Electronically Signed   By: Marijo Conception M.D.   On: 08/31/2019 10:43   Dg Pelvis 1-2 Views  Result Date: 09/01/2019 CLINICAL DATA:  Left hip pain post fall. EXAM: PELVIS - 1-2 VIEW COMPARISON:  August 31, 2019 FINDINGS: Limited single view of the left hip, with internal rotation of the left femur. Post intramedullary nail fixation of the left femur. No evidence of femoral head fracture. Fracture along the greater trochanter is difficult to exclude. Prior total right hip arthroplasty, with intact components. IMPRESSION: 1. Limited single view of the left hip, with internal  rotation of the left femur. 2. Fracture along the greater trochanter is difficult to exclude. 3. Hardware intact. Electronically Signed   By: Fidela Salisbury M.D.   On: 09/01/2019 16:04   Ct Chest Wo Contrast  Result Date: 08/31/2019 CLINICAL DATA:  Rib pain, possible acute mesenteric ischemia. EXAM: CT CHEST, ABDOMEN AND PELVIS WITHOUT CONTRAST TECHNIQUE: Multidetector CT imaging of the chest, abdomen and pelvis was performed following the standard protocol without IV contrast. COMPARISON:  None. FINDINGS: CT CHEST FINDINGS Cardiovascular: Atherosclerosis of thoracic aorta is noted without aneurysm formation. Dissection cannot be excluded due to the lack of intravenous contrast. Normal cardiac size. No pericardial effusion is noted. Mild coronary artery calcifications are noted. Mediastinum/Nodes: Large sliding-type hiatal hernia is noted. 16 mm left thyroid nodule is noted. No adenopathy is noted. Lungs/Pleura: Lungs are clear. No pleural effusion or pneumothorax. Musculoskeletal: No chest wall mass or suspicious bone lesions identified. CT ABDOMEN PELVIS FINDINGS Hepatobiliary: No focal liver abnormality is seen. No gallstones, gallbladder wall thickening, or biliary dilatation. Pancreas: Unremarkable. No pancreatic ductal dilatation or surrounding inflammatory changes. Spleen: Normal in size without focal abnormality. Adrenals/Urinary Tract: Adrenal glands and kidneys appear normal. No hydronephrosis or renal obstruction is noted. No renal or ureteral calculi are noted. Evaluation of the bladder is severely limited due to scatter artifact arising from bilateral hip surgical hardware. Stomach/Bowel: The stomach is unremarkable. There is no evidence of bowel obstruction or inflammation. The appendix is not clearly visualized. Large amount of stool is seen in the sigmoid colon and rectum concerning for possible impaction. Vascular/Lymphatic: Aortic atherosclerosis. No enlarged abdominal or pelvic lymph  nodes. Due to the lack of intravenous contrast, mesenteric ischemia cannot be excluded on the basis of this exam. Reproductive: Status post hysterectomy. No adnexal masses. Other: No abdominal wall hernia or abnormality. No abdominopelvic ascites. Musculoskeletal: Status post  right hip arthroplasty. Status post surgical repair of proximal left femoral fracture. No acute osseous abnormality is noted. IMPRESSION: Large sliding-type hiatal hernia. 1.6 cm left thyroid nodule is noted. Thyroid ultrasound is recommended for further evaluation. Large amount of stool is seen in sigmoid colon and rectum concerning for possible impaction. No definite acute abnormality is noted in the chest, abdomen or pelvis. Due to the lack of intravenous contrast, the patency of mesenteric vessels could not be assessed on this study. Aortic Atherosclerosis (ICD10-I70.0). Electronically Signed   By: Marijo Conception M.D.   On: 08/31/2019 10:43   Ct Hip Left Wo Contrast  Result Date: 09/01/2019 CLINICAL DATA:  Hip pain. EXAM: CT OF THE LEFT HIP WITHOUT CONTRAST TECHNIQUE: Multidetector CT imaging of the left hip was performed according to the standard protocol. Multiplanar CT image reconstructions were also generated. COMPARISON:  X-ray from the same day.  CT dated August 31, 2019. FINDINGS: Bones/Joint/Cartilage The patient is status post placement of an intramedullary nail for a known intratrochanteric fracture of the proximal left femur. This fracture is healed. The hardware is intact. There is a nondisplaced fracture involving the parasymphyseal left pubic bone. There is an age-indeterminate fracture through the anterior column of the left acetabulum. The patient's femoral screw nearly extends beyond the cortex into the joint space. Ligaments Suboptimally assessed by CT. Muscles and Tendons There is some edema extending through the myofascial planes of the left lower extremity. Soft tissues There is nonspecific lower extremity edema  involving the left lower extremity. A rectal tube is in place. A Foley catheter is in place. IMPRESSION: 1. Nondisplaced fracture of the left parasymphyseal pubic bone. 2. Age-indeterminate nondisplaced fracture involving the anterior column of the left acetabulum. The appearance favors a subacute fracture. 3. Stable orthopedic hardware involving the left lower extremity. 4. Nonspecific soft tissue swelling about the left lower extremity. Electronically Signed   By: Constance Holster M.D.   On: 09/01/2019 21:12   Dg Chest Port 1 View  Result Date: 09/01/2019 CLINICAL DATA:  Sepsis, NOS EXAM: PORTABLE CHEST 1 VIEW COMPARISON:  CT 08/31/2019, radiograph 08/31/2019 FINDINGS: Support devices overlie the chest. Lung volumes are diminished with new bandlike areas of atelectasis throughout both lungs. Furthermore there is increasing hazy interstitial opacity with some cephalization of the pulmonary vascularity and fissural and septal thickening to suggest developing pulmonary edema. There is gradient opacity in the left lung base which could reflect a layering effusion. Cardiomediastinal contours are are similar to priors accounting for differences technique including a calcified, tortuous aorta. No acute osseous or soft tissue abnormality. Remote posttraumatic deformity of the left humeral head. Severe dextrocurvature of the thoracic spine. IMPRESSION: 1. Low lung volumes with increasing bandlike areas of atelectasis throughout both lungs. 2. Radiographic features suggest some developing interstitial edema and layering left effusion. 3. Large hiatal hernia. 4.  Aortic Atherosclerosis (ICD10-I70.0). Electronically Signed   By: Lovena Le M.D.   On: 09/01/2019 05:46   Dg Femur Min 2 Views Left  Result Date: 09/01/2019 CLINICAL DATA:  Left hip pain. EXAM: LEFT FEMUR 2 VIEWS COMPARISON:  Left femur x-rays dated July 26, 2019. FINDINGS: No acute fracture or dislocation. Healed left intertrochanteric fracture status  post gamma nail fixation. No evidence of hardware failure or loosening. The left hip joint space is preserved. Osteopenia. Soft tissues are unremarkable. IMPRESSION: 1.  No acute osseous abnormality. 2. Healed left intertrochanteric fracture status post ORIF. Electronically Signed   By: Orville Govern.D.  On: 09/01/2019 16:02   Vas [place] Lower Extremity Venous (dvt) (only [facility])  Result Date: 09/01/2019  Lower Venous Study Indications: Swelling, and Edema.  Limitations: Poor ultrasound/tissue interface. Comparison Study: no prior Performing Technologist: Abram Sander RVS  Examination Guidelines: A complete evaluation includes B-mode imaging, spectral Doppler, color Doppler, and power Doppler as needed of all accessible portions of each vessel. Bilateral testing is considered an integral part of a complete examination. Limited examinations for reoccurring indications may be performed as noted.  +---------+---------------+---------+-----------+----------+--------------+  RIGHT     Compressibility Phasicity Spontaneity Properties Thrombus Aging  +---------+---------------+---------+-----------+----------+--------------+  CFV       Full            Yes       Yes                                    +---------+---------------+---------+-----------+----------+--------------+  SFJ       Full                                                             +---------+---------------+---------+-----------+----------+--------------+  FV Prox   Full                                                             +---------+---------------+---------+-----------+----------+--------------+  FV Mid    Full                                                             +---------+---------------+---------+-----------+----------+--------------+  FV Distal Full                                                             +---------+---------------+---------+-----------+----------+--------------+  PFV       Full                                                              +---------+---------------+---------+-----------+----------+--------------+  POP       Full            Yes       Yes                                    +---------+---------------+---------+-----------+----------+--------------+  PTV  Not visualized  +---------+---------------+---------+-----------+----------+--------------+  PERO                                                       Not visualized  +---------+---------------+---------+-----------+----------+--------------+   +---------+---------------+---------+-----------+----------+------------------+  LEFT      Compressibility Phasicity Spontaneity Properties Thrombus Aging      +---------+---------------+---------+-----------+----------+------------------+  CFV       Full            Yes       Yes                                        +---------+---------------+---------+-----------+----------+------------------+  SFJ       Full                                                                 +---------+---------------+---------+-----------+----------+------------------+  FV Prox   Full                                                                 +---------+---------------+---------+-----------+----------+------------------+  FV Mid                    Yes       Yes                    limited                                                                         visualization       +---------+---------------+---------+-----------+----------+------------------+  FV Distal                 Yes       Yes                    limited                                                                         visualization       +---------+---------------+---------+-----------+----------+------------------+  PFV       Full                                                                 +---------+---------------+---------+-----------+----------+------------------+  POP       Full             Yes       Yes                                        +---------+---------------+---------+-----------+----------+------------------+  PTV                                                        Not visualized      +---------+---------------+---------+-----------+----------+------------------+  PERO                                                       Not visualized      +---------+---------------+---------+-----------+----------+------------------+     Summary: Right: There is no evidence of deep vein thrombosis in the lower extremity. No cystic structure found in the popliteal fossa. Left: There is no evidence of deep vein thrombosis in the lower extremity. However, portions of this examination were limited- see technologist comments above. No cystic structure found in the popliteal fossa.  *See table(s) above for measurements and observations. Electronically signed by Curt Jews MD on 09/01/2019 at 4:40:52 PM.    Final       LOS: 2 days   Time spent: More than 50% of that time was spent in counseling and/or coordination of care.  Antonieta Pert, MD [facility]  09/02/2019, 8:24 AM

## 2019-09-02 NOTE — Evaluation (Signed)
Physical Therapy Evaluation Patient Details Name: Angelica Bell MRN: [id number] DOB: 1932-12-03 Today's Date: 09/02/2019   History of Present Illness  Pt adm after fall and found on floor at home. Pt with acute toxic/metabolic encephalopathy, UTI, rhabdo. Pt also found to have lt pubic and acetabular fx. PMH - RA, rt THR, chronic back pain, PE, scoliosis, osteoporosis, HTN, recent CVA  Clinical Impression  Pt admitted with above diagnosis and presents to PT with functional limitations due to deficits listed below (See PT problem list). Pt needs skilled PT to maximize independence and safety to allow discharge to SNF. Progress will be dependent on pain.      Follow Up Recommendations SNF    Equipment Recommendations  Other (comment)(TBD)    Recommendations for Other Services       Precautions / Restrictions Precautions Precautions: Fall Restrictions Weight Bearing Restrictions: Yes LLE Weight Bearing: Weight bearing as tolerated      Mobility  Bed Mobility Overal bed mobility: Needs Assistance Bed Mobility: Supine to Sit;Sit to Supine     Supine to sit: +2 for physical assistance;Total assist Sit to supine: +2 for physical assistance;Total assist   General bed mobility comments: Assist for all aspects.   Transfers                    Ambulation/Gait                Stairs            Wheelchair Mobility    Modified Rankin (Stroke Patients Only)       Balance Overall balance assessment: Needs assistance Sitting-balance support: Bilateral upper extremity supported;Feet unsupported Sitting balance-Leahy Scale: Poor Sitting balance - Comments: Sat EOB x 5-6 minutes with min to min guard                                     Pertinent Vitals/Pain Pain Assessment: Faces Faces Pain Scale: Hurts whole lot Pain Location: lt hip/pelvis Pain Descriptors / Indicators: Grimacing;Guarding Pain Intervention(s): Limited activity within  patient's tolerance;Monitored during session;Premedicated before session;Repositioned    Home Living Family/patient expects to be discharged to:: Skilled nursing facility Living Arrangements: Alone Available Help at Discharge: Family;Available PRN/intermittently Type of Home: House Home Access: Stairs to enter Entrance Stairs-Rails: None Entrance Stairs-Number of Steps: 1 Home Layout: One level Home Equipment: Environmental consultant - 2 wheels Additional Comments: Information from prior admit    Prior Function Level of Independence: Independent with assistive device(s)         Comments: Using rolling walker     Hand Dominance   Dominant Hand: Right    Extremity/Trunk Assessment   Upper Extremity Assessment Upper Extremity Assessment: Defer to OT evaluation    Lower Extremity Assessment Lower Extremity Assessment: Generalized weakness;LLE deficits/detail LLE Deficits / Details: Limited by pain. Able to move with assist       Communication   Communication: HOH  Cognition Arousal/Alertness: Awake/alert Behavior During Therapy: Anxious Overall Cognitive Status: No family/caregiver present to determine baseline cognitive functioning Area of Impairment: Attention;Memory;Following commands;Problem solving;Safety/judgement                   Current Attention Level: Selective Memory: Decreased short-term memory Following Commands: Follows one step commands with increased time Safety/Judgement: Decreased awareness of deficits;Decreased awareness of safety   Problem Solving: Requires verbal cues;Requires tactile cues  General Comments      Exercises     Assessment/Plan    PT Assessment Patient needs continued PT services  PT Problem List Decreased strength;Decreased activity tolerance;Decreased range of motion;Decreased balance;Decreased mobility;Decreased cognition;Pain       PT Treatment Interventions DME instruction;Gait training;Functional mobility  training;Therapeutic activities;Therapeutic exercise;Balance training;Cognitive remediation;Patient/family education    PT Goals (Current goals can be found in the Care Plan section)  Acute Rehab PT Goals Patient Stated Goal: Pt didn't state PT Goal Formulation: With patient Time For Goal Achievement: 09/16/19 Potential to Achieve Goals: Fair    Frequency Min 3X/week   Barriers to discharge Decreased caregiver support Lives alone    Co-evaluation               AM-PAC PT "6 Clicks" Mobility  Outcome Measure Help needed turning from your back to your side while in a flat bed without using bedrails?: Total Help needed moving from lying on your back to sitting on the side of a flat bed without using bedrails?: Total Help needed moving to and from a bed to a chair (including a wheelchair)?: Total Help needed standing up from a chair using your arms (e.g., wheelchair or bedside chair)?: Total Help needed to walk in hospital room?: Total Help needed climbing 3-5 steps with a railing? : Total 6 Click Score: 6    End of Session   Activity Tolerance: Patient limited by pain Patient left: in bed;with call bell/phone within reach;with bed alarm set Nurse Communication: Mobility status PT Visit Diagnosis: Other abnormalities of gait and mobility (R26.89);Repeated falls (R29.6);Pain Pain - Right/Left: Left Pain - part of body: Hip    Time: 1420-1440 PT Time Calculation (min) (ACUTE ONLY): 20 min   Charges:   PT Evaluation $PT Eval Moderate Complexity: 1 [place] Pager [phone] Office [place] 09/02/2019, 4:48 PM

## 2019-09-02 NOTE — Progress Notes (Signed)
Hardened red area noted to the left of right breast. MD notified.

## 2019-09-03 LAB — COMPREHENSIVE METABOLIC PANEL
ALT: 320 U/L — ABNORMAL HIGH (ref 0–44)
AST: 283 U/L — ABNORMAL HIGH (ref 15–41)
Albumin: 2.1 g/dL — ABNORMAL LOW (ref 3.5–5.0)
Alkaline Phosphatase: 68 U/L (ref 38–126)
Anion gap: 5 (ref 5–15)
BUN: 27 mg/dL — ABNORMAL HIGH (ref 8–23)
CO2: 23 mmol/L (ref 22–32)
Calcium: 8.2 mg/dL — ABNORMAL LOW (ref 8.9–10.3)
Chloride: 112 mmol/L — ABNORMAL HIGH (ref 98–111)
Creatinine, Ser: 1.52 mg/dL — ABNORMAL HIGH (ref 0.44–1.00)
GFR calc Af Amer: 36 mL/min — ABNORMAL LOW (ref >60)
GFR calc non Af Amer: 31 mL/min — ABNORMAL LOW (ref >60)
Glucose, Bld: 103 mg/dL — ABNORMAL HIGH (ref 70–99)
Potassium: 3.6 mmol/L (ref 3.5–5.1)
Sodium: 140 mmol/L (ref 135–145)
Total Bilirubin: 0.6 mg/dL (ref 0.3–1.2)
Total Protein: 4.2 g/dL — ABNORMAL LOW (ref 6.5–8.1)

## 2019-09-03 LAB — CBC
HCT: 32.4 % — ABNORMAL LOW (ref 36.0–46.0)
Hemoglobin: 10.6 g/dL — ABNORMAL LOW (ref 12.0–15.0)
MCH: 28.8 pg (ref 26.0–34.0)
MCHC: 32.7 g/dL (ref 30.0–36.0)
MCV: 88 fL (ref 80.0–100.0)
Platelets: 202 10*3/uL (ref 150–400)
RBC: 3.68 MIL/uL — ABNORMAL LOW (ref 3.87–5.11)
RDW: 14.3 % (ref 11.5–15.5)
WBC: 13.2 10*3/uL — ABNORMAL HIGH (ref 4.0–10.5)
nRBC: 0 % (ref 0.0–0.2)

## 2019-09-03 LAB — CK: Total CK: 4115 U/L — ABNORMAL HIGH (ref 38–234)

## 2019-09-03 LAB — MYOGLOBIN, URINE: Myoglobin, Ur: 11662 ng/mL — ABNORMAL HIGH (ref 0–13)

## 2019-09-03 LAB — GLUCOSE, CAPILLARY
Glucose-Capillary: 112 mg/dL — ABNORMAL HIGH (ref 70–99)
Glucose-Capillary: 94 mg/dL (ref 70–99)
Glucose-Capillary: 97 mg/dL (ref 70–99)

## 2019-09-03 LAB — AMMONIA: Ammonia: 15 umol/L (ref 9–35)

## 2019-09-03 NOTE — Plan of Care (Signed)
  Problem: Health Behavior/Discharge Planning: Goal: Ability to manage health-related needs will improve Outcome: Not Progressing Note: Patient frequently requesting for pain medication, even if she had recently been medicated for pain, patient forgetfully denying taking pain medication and wanting another med. Patient is constantly tearful and requesting for more pain medicine.    Problem: Coping: Goal: Level of anxiety will decrease Outcome: Not Progressing Note: Constantly tearful and agitated for medication   Problem: Pain Managment: Goal: General experience of comfort will improve Outcome: Not Progressing Note: Patient receiving prn pain medications as scheduled, patient consistently saying she is hurting
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
## 2019-09-03 NOTE — Evaluation (Signed)
Occupational Therapy Evaluation Patient Details Name: Angelica Bell MRN: [id number] DOB: 22-Mar-1932 Today's Date: 09/03/2019    History of Present Illness Pt adm after fall and found on floor at home. Pt with acute toxic/metabolic encephalopathy, UTI, rhabdo. Pt also found to have lt pubic and acetabular fx. PMH - RA, rt THR, chronic back pain, PE, scoliosis, osteoporosis, HTN, recent CVA   Clinical Impression   Pt admitted with the above diagnosis and has the deficits listed below. Pt lives alone and would benefit from cont OT to attempt to increase independence with basic adls and adl transfers so she can d/c to SNF and receive further therapy to gain further independence. Pt is currently very limited by pain and unable to fully participate.  Hoping with pain control, participation will increase.     Follow Up Recommendations  SNF;Supervision/Assistance - 24 hour    Equipment Recommendations  Other (comment)(tbd)    Recommendations for Other Services       Precautions / Restrictions Precautions Precautions: Fall Restrictions Weight Bearing Restrictions: No LLE Weight Bearing: Weight bearing as tolerated      Mobility Bed Mobility Overal bed mobility: Needs Assistance Bed Mobility: Rolling Rolling: Max assist         General bed mobility comments: Assist for all aspects.   Transfers                 General transfer comment: unable    Balance                                           ADL either performed or assessed with clinical judgement   ADL Overall ADL's : Needs assistance/impaired Eating/Feeding: Minimal assistance;Sitting Eating/Feeding Details (indicate cue type and reason): if pt can get comfortable, pt can feed self after set up. Grooming: Wash/dry hands;Wash/dry face;Oral care;Set up;Sitting Grooming Details (indicate cue type and reason): if pt is comfortable, she can complete basic grooming in bed.  Very difficult to get  put comfortable today. Upper Body Bathing: Total assistance;Bed level   Lower Body Bathing: Total assistance;Bed level   Upper Body Dressing : Total assistance;Bed level   Lower Body Dressing: Total assistance;Bed level                 General ADL Comments: Pt limited by pain.  Unable to do more than roll R to L today.     Vision Baseline Vision/History: No visual deficits Patient Visual Report: No change from baseline       Perception Perception Perception Tested?: No   Praxis Praxis Praxis tested?: Not tested    Pertinent Vitals/Pain Pain Assessment: 0-10 Pain Score: 10-Worst pain ever Pain Location: lt hip/pelvis Pain Descriptors / Indicators: Grimacing;Guarding Pain Intervention(s): Limited activity within patient's tolerance;Monitored during session;Premedicated before session;Repositioned;Patient requesting pain meds-RN notified;Relaxation     Hand Dominance Right   Extremity/Trunk Assessment Upper Extremity Assessment Upper Extremity Assessment: LUE deficits/detail LUE Deficits / Details: ROM in fingers limited premorbidly LUE: Unable to fully assess due to pain LUE Sensation: WNL LUE Coordination: decreased fine motor;decreased gross motor   Lower Extremity Assessment Lower Extremity Assessment: Defer to PT evaluation LLE Deficits / Details: Limited by pain. Able to move with assist   Cervical / Trunk Assessment Cervical / Trunk Assessment: Normal   Communication Communication Communication: HOH   Cognition Arousal/Alertness: Awake/alert Behavior During Therapy: Anxious Overall Cognitive Status: No  family/caregiver present to determine baseline cognitive functioning Area of Impairment: Attention;Memory;Following commands;Problem solving;Safety/judgement                   Current Attention Level: Selective Memory: Decreased short-term memory Following Commands: Follows one step commands with increased time Safety/Judgement: Decreased  awareness of deficits;Decreased awareness of safety   Problem Solving: Requires verbal cues;Requires tactile cues General Comments: Pt so focused on her pain that she was unable to participate much in functional tasks.  Pt very angry at any staff memeber that would not give her or check in to getting her pain meds.  Did  talk to nurse to see if pain is being adequately addressed.  Per chart, it is.    General Comments  Pt participation limited by pain.    Exercises     Shoulder Instructions      Home Living Family/patient expects to be discharged to:: Skilled nursing facility Living Arrangements: Alone                                      Prior Functioning/Environment Level of Independence: Independent with assistive device(s)        Comments: Using rolling walker        OT Problem List: Decreased range of motion;Decreased activity tolerance;Impaired balance (sitting and/or standing);Decreased cognition;Decreased coordination;Decreased safety awareness;Decreased knowledge of use of DME or AE;Pain;Impaired UE functional use      OT Treatment/Interventions: Self-care/ADL training;Therapeutic activities;DME and/or AE instruction    OT Goals(Current goals can be found in the care plan section) Acute Rehab OT Goals Patient Stated Goal: Pt didn't state OT Goal Formulation: With patient Time For Goal Achievement: 09/17/19 Potential to Achieve Goals: Fair ADL Goals Pt Will Perform Upper Body Bathing: with set-up;bed level Pt Will Perform Lower Body Bathing: with mod assist;bed level Pt Will Transfer to Toilet: with mod assist;bedside commode;stand pivot transfer Additional ADL Goal #1: Pt will tolerate EOB activities for 10 minutes in prep for adls on EOB with min assist.  OT Frequency: Min 2X/week   Barriers to D/C: Decreased caregiver support  Pt lives alone       Co-evaluation              AM-PAC OT "6 Clicks" Daily Activity     Outcome Measure  Help from another person eating meals?: A Little Help from another person taking care of personal grooming?: A Little Help from another person toileting, which includes using toliet, bedpan, or urinal?: Total Help from another person bathing (including washing, rinsing, drying)?: Total Help from another person to put on and taking off regular upper body clothing?: Total Help from another person to put on and taking off regular lower body clothing?: Total 6 Click Score: 10   End of Session Nurse Communication: Mobility status;Patient requests pain meds  Activity Tolerance: Patient limited by pain Patient left: in bed;with call bell/phone within reach;with bed alarm set  OT Visit Diagnosis: Pain;Unsteadiness on feet (R26.81);Other abnormalities of gait and mobility (R26.89);Other symptoms and signs involving cognitive function;Repeated falls (R29.6) Pain - Right/Left: (both) Pain - part of body: (pelvis)                Time: [id number] OT Time Calculation (min): 28 min Charges:  OT General Charges $OT Visit: 1 Visit OT Evaluation $OT Eval Moderate Complexity: Caroline, OTR/L [id number]  Glenford Peers 09/03/2019, 12:06 PM

## 2019-09-03 NOTE — Progress Notes (Addendum)
PROGRESS NOTE    Angelica Bell  [id number] DOB: Aug 09, 1932 DOA: 08/31/2019 PCP: Heywood Bene, PA-C   Brief Narrative: 85 year old female with past medical history of prior CVA, hypertension, arthritis, chronic pain most recently discharged 8/8 following acute CVA, dehydration, rhabdomyolysis acute renal failure who was found down at home by the grand son. Patinet was back home from SNF last thursday , was found on floor on Friday morning by grandson. He went to check onTuesday morning (9/1) and found her on floor and he called EMS and she was brought to ER. Apparently fell in bathroom, took over an hour to get her bearings and dragged herself out toward phone.Patient responded very well to narcan and narcan drip.  She did not respond to fluid resuscitation in terms of Bps so PCCM asked to admit for hypotension. She also had  rhabdo,aki, lactic acidosis, borderline UA, leucocytosis CT head no acute finding. Utox with benzos and opiates. Not sure where benzodiazepines came from but note history of taking too many xanax back in 2012. Per PCCM overall impression here is she is over-medicated at home, fell, and had rhabdo/AKI from prolonged downtime and poor PO.  Also has fecal impaction, deconditioning, elevated ammonia and AST/ALT pattern that could be indicative of EtOH use although she denies.  Shock seems more hypovolemic given response to fluids.  Questionable UTI but I guess will treat with AMS, leukocytosis, and UA, d/c abx if Pct < 0.5.Patient's blood pressure improved and she was transferred to [facility] service 9/3. Patient hydrated, AKI, rhabdomyolysis improved Continue to complain of left leg pain CT scan obtained that showed nondisplaced fracture of the left pubic bone  Subjective:  Over feeling better, still with left leg pain. Nursing has noticed mass on the right breast ?bruise + on loer inner breast and has prior scar on upper side - which she states is from breast augmentation  surgery Creatinine improved to 1.5, CK down to 4K  Assessment & Plan:  Acute toxic, possibly metabolic Encephalopathy, secondary to UTI/metabolic abnormalities/opiates: Mental status overall improved. Cont pt/ot. AnticipatingSNF placement. patient was initially given Narcan drip-she is on oxycodone 30 mg 3 times a day as needed at home- started on 10 mg q8 hr prn. Cont Lexapro.  Mental status is improved and at baseline.  Rhabdomyolysis likely from laying on floor:CK uptrended from 3k-->18k on 9/2- ivf increased to 162ml/hr and Ck down to 8700-down to 4K continue NS at 100 mL/h. Cont to hold statins for now  SIRS/Leucocytosis:from UTI.  CT chest/abd with constipation and bronchiectatic changes, leukocytosis downtrending, afebrile.  Continue on cefepime as Urine growing Pseudomonas, off vancomycin continue hydration.  Blood Culture no growth so far.  Pseudomonas UTI: Reviewed culture sensitivity.  Continue cefepime Leukocytosis downtrending. Recent Labs  Lab 08/31/19 0841 09/01/19 0236 09/02/19 0241 09/03/19 0237  WBC 29.8* 24.5* 14.1* 13.2*   Hypotension/shock likely volume depletion/related to opiate?Marland Kitchen Off pressors.  Blood pressure is stable.   continue ivf. Blood culture  NGTD.  AKI: In the setting of hypotension, rhabdomyolysis, nsaid use. Improving nicely on IV fluids.Hold home NSAIDs monitor intake output. Recent Labs  Lab 08/31/19 0741 08/31/19 0753 09/01/19 0236 09/02/19 0241 09/03/19 0237  BUN 27* 39* 34* 31* 27*  CREATININE 2.42* 2.20* 1.82* 1.66* 1.52*   Constipation:resolved, was having diarrhea needing flexiseal 9/2- stopped miralax, lactulose. constipation likely from narcotics.   Transaminitis/Elevated Ammonia:bump overnight on 9/1. pending viral hep panel.  CT abd 9/1 normal Hepatobiliary findings,likely from hypotension/rhabdomyolysis and now improving- avoid  hepatotoxic medication, cont to hold Lipitor.  Monitor liver function test.   Recent Labs  Lab  08/31/19 0741 09/01/19 0236 09/02/19 0241 09/03/19 0237  AST 118* 666* 354* 283*  ALT 64* 502* 362* 320*  ALKPHOS 88 90 67 68  BILITOT 0.5 0.4 0.4 0.6  PROT 6.4* 4.3* 3.8* 4.2*  ALBUMIN 3.7 2.3* 2.0* 2.1*  INR 1.2  --   --   --    Left hip pain/bruise:CT obtained 9/2-showed "Nondisplaced fracture of the left parasymphyseal pubic bone.Age-indeterminate nondisplaced fracture involving the anterior column of the left acetabulum.-likely subacute". Add prn oxy for pain, PT/OT, ortho consulted and spoke w Michael-continue WBAT and outpatient follow-up with Dr. Stann Mainland  Area of swelling in the right breast question bruise, also has previous scar from breast augmentation-SILICONE on upper inner breast- had MRI/Mammo in 2006 in record. I called and spoke w Dr Ardeen Garland GYN- she advised outpatient mammogram or inpatient if she is here when radiology office opens- as it is long weekend. If colour changes over next days could be bruise/hematoma.  Hx of CVA: Continue aspirin hold Lipitor due to elevated LFTs  Hx of hiatal hernia/gerd:cont ppi.  DM, hba1c 5.9 on 7/19- cont ssi.  DVT prophylaxis:Heparin Code Status: Full code Family Communication: plan of care discussed with patient in detail. I had updated Mr Case, patient's grandson. He is agreeable for SNF once stable. Disposition Plan: Remains inpatient pending clinical improvement. Will need SNF.  Downgraded to [employer] 9/3  Consultants:  CCM  Procedures: CT brain 9/1:Evolutionary findings in the small white matter infarcts in the left occipital lobe right parietal lobe, currently hypodense indicating increased chronicity. Both of these were subacute at the time of prior brain MRI of 07/27/2019. 2. Old right cerebellar infarct. 3. Periventricular white matter and corona radiata hypodensities favor chronic ischemic microvascular white matter disease. 4. No acute findings. CT chest abd/pelvis 9/1: Large sliding-type hiatal hernia.1.6 cm left  thyroid nodule is noted. Thyroid ultrasound is recommended for further evaluation. Large amount of stool is seen in sigmoid colon and rectum concerning for possible impaction.Bibasilar bronchiectatic changes near the area of hiatal hernia   CT left hip9/2 IMPRESSION: 1. Nondisplaced fracture of the left parasymphyseal pubic bone. 2. Age-indeterminate nondisplaced fracture involving the anterior column of the left acetabulum. The appearance favors a subacute fracture. 3. Stable orthopedic hardware involving the left lower extremity. 4. Nonspecific soft tissue swelling about the left lower extremity.  Microbiology:  UC 9/1>>> Pseudomonas BCX2 9/1>>>NGTD  Antimicrobials: Anti-infectives (From admission, onward)   Start     Dose/Rate Route Frequency Ordered Stop   09/01/19 0900  ceFEPIme (MAXIPIME) 2 g in sodium chloride 0.9 % 100 mL IVPB     2 g 200 mL/hr over 30 Minutes Intravenous Every 24 hours 08/31/19 0900     08/31/19 0900  vancomycin variable dose per unstable renal function (pharmacist dosing)  Status:  Discontinued      Does not apply See admin instructions 08/31/19 0900 09/01/19 0924   08/31/19 0800  ceFEPIme (MAXIPIME) 2 g in sodium chloride 0.9 % 100 mL IVPB     2 g 200 mL/hr over 30 Minutes Intravenous  Once 08/31/19 0747 08/31/19 0948   08/31/19 0800  metroNIDAZOLE (FLAGYL) IVPB 500 mg     500 mg 100 mL/hr over 60 Minutes Intravenous  Once 08/31/19 0747 08/31/19 1034   08/31/19 0800  vancomycin (VANCOCIN) IVPB 1000 mg/200 mL premix     1,000 mg 200 mL/hr over 60 Minutes Intravenous  Once 08/31/19 0747 08/31/19 1419       Objective: Vitals:   09/02/19 2130 09/03/19 0500 09/03/19 0521 09/03/19 0522  BP: (!) 147/71  (!) 161/79 (!) 162/77  Pulse: 83  78 78  Resp: 20     Temp: 98.5 F (36.9 C)  99 F (37.2 C)   TempSrc: Oral  Oral   SpO2: 93%  95% 95%  Weight:  63.7 kg    Height:        Intake/Output Summary (Last 24 hours) at 09/03/2019 1310 Last data  filed at 09/03/2019 1202 Gross per 24 hour  Intake 2485.31 ml  Output 4430 ml  Net -1944.69 ml   Filed Weights   09/01/19 0437 09/02/19 0500 09/03/19 0500  Weight: 72.4 kg 53.4 kg 63.7 kg   Weight change: 10.3 kg  Body mass index is 22.67 kg/m.  Intake/Output from previous day: 09/03 0701 - 09/04 0700 In: 3300.3 [P.O.:240; I.V.:3060.3] Out: 2805 [Urine:2205; Stool:600] Intake/Output this shift: Total I/O In: 350 [P.O.:250; IV Piggyback:100] Out: 1900 [Urine:1200; Stool:700]  Examination:  General exam: Alert awake oriented, elderly and frail.  HEENT:PERRL,Oral mucosa moist, Ear/Nose normal on gross exam Respiratory system: Bilateral equal breath sounds, no wheezing or crackles.   Cardiovascular system: S1 & S2 heard,No JVD, murmurs. Gastrointestinal system: Abdomen is  soft, non tender, non distended, BS +  Nervous System:Alert and oriented at baseline.difficulty moving left leg with pain Extremities: No edema, no clubbing, weak left side- bruise left hip, Skin: No rashes, lesions, no icterus MSK: Normal muscle bulk,tone ,power left breast inner lower side w erythema, mass?  Medications:  Scheduled Meds:  aspirin EC  81 mg Oral Daily   bisacodyl  10 mg Rectal Once   Chlorhexidine Gluconate Cloth  6 each Topical Daily   docusate sodium  100 mg Oral BID   escitalopram  20 mg Oral Daily   heparin  5,000 Units Subcutaneous Q8H   pantoprazole  40 mg Oral Daily   Continuous Infusions:  sodium chloride 125 mL/hr at 09/03/19 0641   ceFEPime (MAXIPIME) IV Stopped (09/03/19 0912)   lactated ringers Stopped (09/02/19 0800)    Data Reviewed: I have personally reviewed following labs and imaging studies  CBC: Recent Labs  Lab 08/31/19 0753 08/31/19 0841 09/01/19 0236 09/02/19 0241 09/03/19 0237  WBC  --  29.8* 24.5* 14.1* 13.2*  NEUTROABS  --  25.9*  --   --   --   HGB 14.6 10.2* 11.2* 9.7* 10.6*  HCT 43.0 34.7* 36.7 30.8* 32.4*  MCV  --  98.0 93.1 90.9  88.0  PLT  --  236 242 170 [id number]   Basic Metabolic Panel: Recent Labs  Lab 08/31/19 0741 08/31/19 0753 09/01/19 0236 09/02/19 0241 09/03/19 0237  NA 140 137 138 139 140  K 5.2* 5.1 4.7 4.1 3.6  CL 103 104 106 110 112*  CO2 19*  --  21* 21* 23  GLUCOSE 108* 99 112* 94 103*  BUN 27* 39* 34* 31* 27*  CREATININE 2.42* 2.20* 1.82* 1.66* 1.52*  CALCIUM 8.9  --  7.6* 7.7* 8.2*  MG 2.3  --  1.7  --   --   PHOS 10.3*  --  4.7*  --   --    GFR: Estimated Creatinine Clearance: 25.3 mL/min (A) (by C-G formula based on SCr of 1.52 mg/dL (H)). Liver Function Tests: Recent Labs  Lab 08/31/19 0741 09/01/19 0236 09/02/19 0241 09/03/19 0237  AST 118* 666* 354* 283*  ALT 64*  502* 362* 320*  ALKPHOS 88 90 67 68  BILITOT 0.5 0.4 0.4 0.6  PROT 6.4* 4.3* 3.8* 4.2*  ALBUMIN 3.7 2.3* 2.0* 2.1*   No results for input(s): LIPASE, AMYLASE in the last 168 hours. Recent Labs  Lab 08/31/19 0741 09/03/19 0237  AMMONIA 64* 15   Coagulation Profile: Recent Labs  Lab 08/31/19 0741  INR 1.2   Cardiac Enzymes: Recent Labs  Lab 08/31/19 0741 09/01/19 0642 09/02/19 0241 09/03/19 0237  CKTOTAL 3,175* 18,996* 8,700* 4,115*   BNP (last 3 results) No results for input(s): PROBNP in the last 8760 hours. HbA1C: No results for input(s): HGBA1C in the last 72 hours. CBG: Recent Labs  Lab 09/02/19 1530 09/02/19 1745 09/03/19 0017 09/03/19 0517 09/03/19 0825  GLUCAP 156* 99 112* 97 94   Lipid Profile: No results for input(s): CHOL, HDL, LDLCALC, TRIG, CHOLHDL, LDLDIRECT in the last 72 hours. Thyroid Function Tests: No results for input(s): TSH, T4TOTAL, FREET4, T3FREE, THYROIDAB in the last 72 hours. Anemia Panel: No results for input(s): VITAMINB12, FOLATE, FERRITIN, TIBC, IRON, RETICCTPCT in the last 72 hours. Sepsis Labs: Recent Labs  Lab 08/31/19 0739 08/31/19 0741 08/31/19 0945  PROCALCITON  --  0.65  --   LATICACIDVEN 7.7*  --  3.5*    Recent Results (from the past 240  hour(s))  Blood Culture (routine x 2)     Status: None (Preliminary result)   Collection Time: 08/31/19  7:30 AM   Specimen: BLOOD  Result Value Ref Range Status   Specimen Description BLOOD LEFT ANTECUBITAL  Final   Special Requests   Final    BOTTLES DRAWN AEROBIC AND ANAEROBIC Blood Culture adequate volume   Culture   Final    NO GROWTH 3 DAYS Performed at [facility], [address], [place], [place] [address]    Report Status PENDING  Incomplete  Blood Culture (routine x 2)     Status: None (Preliminary result)   Collection Time: 08/31/19  7:39 AM   Specimen: BLOOD RIGHT FOREARM  Result Value Ref Range Status   Specimen Description BLOOD RIGHT FOREARM  Final   Special Requests   Final    BOTTLES DRAWN AEROBIC AND ANAEROBIC Blood Culture results may not be optimal due to an inadequate volume of blood received in culture bottles   Culture   Final    NO GROWTH 3 DAYS Performed at [facility], [place] [address], [place], [place] [address]    Report Status PENDING  Incomplete  SARS Coronavirus 2 [facility] order, Performed in [facility] hospital lab) Nasopharyngeal Nasopharyngeal Swab     Status: None   Collection Time: 08/31/19  8:00 AM   Specimen: Nasopharyngeal Swab  Result Value Ref Range Status   SARS Coronavirus 2 NEGATIVE NEGATIVE Final    Comment: (NOTE) If result is NEGATIVE SARS-CoV-2 target nucleic acids are NOT DETECTED. The SARS-CoV-2 RNA is generally detectable in upper and lower  respiratory specimens during the acute phase of infection. The lowest  concentration of SARS-CoV-2 viral copies this assay can detect is 250  copies / mL. A negative result does not preclude SARS-CoV-2 infection  and should not be used as the sole basis for treatment or other  patient management decisions.  A negative result may occur with  improper specimen collection / handling, submission of specimen other  than nasopharyngeal swab, presence of viral mutation(s) within the   areas targeted by this assay, and inadequate number of viral copies  (<  250 copies / mL). A negative result must be combined with clinical  observations, patient history, and epidemiological information. If result is POSITIVE SARS-CoV-2 target nucleic acids are DETECTED. The SARS-CoV-2 RNA is generally detectable in upper and lower  respiratory specimens dur ing the acute phase of infection.  Positive  results are indicative of active infection with SARS-CoV-2.  Clinical  correlation with patient history and other diagnostic information is  necessary to determine patient infection status.  Positive results do  not rule out bacterial infection or co-infection with other viruses. If result is PRESUMPTIVE POSTIVE SARS-CoV-2 nucleic acids MAY BE PRESENT.   A presumptive positive result was obtained on the submitted specimen  and confirmed on repeat testing.  While 2019 novel coronavirus  (SARS-CoV-2) nucleic acids may be present in the submitted sample  additional confirmatory testing may be necessary for epidemiological  and / or clinical management purposes  to differentiate between  SARS-CoV-2 and other Sarbecovirus currently known to infect humans.  If clinically indicated additional testing with an alternate test  methodology [phone]) is advised. The SARS-CoV-2 RNA is generally  detectable in upper and lower respiratory sp ecimens during the acute  phase of infection. The expected result is Negative. Fact Sheet for Patients:  StrictlyIdeas.no Fact Sheet for Healthcare Providers: BankingDealers.co.za This test is not yet approved or cleared by the [place] FDA and has been authorized for detection and/or diagnosis of SARS-CoV-2 by FDA under an Emergency Use Authorization (EUA).  This EUA will remain in effect (meaning this test can be used) for the duration of the COVID-19 declaration under Section 564(b)(1) of the Act, 21  U.S.C. section 360bbb-3(b)(1), unless the authorization is terminated or revoked sooner. Performed at [facility], [place] [address], [place], [place] [address]   Urine culture     Status: Abnormal   Collection Time: 08/31/19  9:20 AM   Specimen: In/Out Cath Urine  Result Value Ref Range Status   Specimen Description IN/OUT CATH URINE  Final   Special Requests   Final    NONE Performed at [facility], [place] [address], [place], [place] [address]    Culture >=100,000 COLONIES/mL PSEUDOMONAS AERUGINOSA (A)  Final   Report Status 09/02/2019 FINAL  Final   Organism ID, Bacteria PSEUDOMONAS AERUGINOSA (A)  Final      Susceptibility   Pseudomonas aeruginosa - MIC*    CEFTAZIDIME 2 SENSITIVE Sensitive     CIPROFLOXACIN >=4 RESISTANT Resistant     GENTAMICIN 4 SENSITIVE Sensitive     IMIPENEM <=0.25 SENSITIVE Sensitive     PIP/TAZO <=4 SENSITIVE Sensitive     CEFEPIME 2 SENSITIVE Sensitive     * >=100,000 COLONIES/mL PSEUDOMONAS AERUGINOSA  MRSA PCR Screening     Status: None   Collection Time: 08/31/19  2:28 PM   Specimen: Nasal Mucosa; Nasopharyngeal  Result Value Ref Range Status   MRSA by PCR NEGATIVE NEGATIVE Final    Comment:        The GeneXpert MRSA Assay (FDA approved for NASAL specimens only), is one component of a comprehensive MRSA colonization surveillance program. It is not intended to diagnose MRSA infection nor to guide or monitor treatment for MRSA infections. Performed at [facility], [place] [address] [place], [place] [address]       Radiology Studies: Dg Pelvis 1-2 Views  Result Date: 09/01/2019 CLINICAL DATA:  Left hip pain post fall. EXAM: PELVIS - 1-2 VIEW COMPARISON:  August 31, 2019 FINDINGS: Limited  single view of the left hip, with internal rotation of the left femur. Post intramedullary nail fixation of the left femur. No evidence of femoral head fracture. Fracture along the greater trochanter is difficult to exclude. Prior total  right hip arthroplasty, with intact components. IMPRESSION: 1. Limited single view of the left hip, with internal rotation of the left femur. 2. Fracture along the greater trochanter is difficult to exclude. 3. Hardware intact. Electronically Signed   By: Fidela Salisbury M.D.   On: 09/01/2019 16:04   Ct Hip Left Wo Contrast  Result Date: 09/01/2019 CLINICAL DATA:  Hip pain. EXAM: CT OF THE LEFT HIP WITHOUT CONTRAST TECHNIQUE: Multidetector CT imaging of the left hip was performed according to the standard protocol. Multiplanar CT image reconstructions were also generated. COMPARISON:  X-ray from the same day.  CT dated August 31, 2019. FINDINGS: Bones/Joint/Cartilage The patient is status post placement of an intramedullary nail for a known intratrochanteric fracture of the proximal left femur. This fracture is healed. The hardware is intact. There is a nondisplaced fracture involving the parasymphyseal left pubic bone. There is an age-indeterminate fracture through the anterior column of the left acetabulum. The patient's femoral screw nearly extends beyond the cortex into the joint space. Ligaments Suboptimally assessed by CT. Muscles and Tendons There is some edema extending through the myofascial planes of the left lower extremity. Soft tissues There is nonspecific lower extremity edema involving the left lower extremity. A rectal tube is in place. A Foley catheter is in place. IMPRESSION: 1. Nondisplaced fracture of the left parasymphyseal pubic bone. 2. Age-indeterminate nondisplaced fracture involving the anterior column of the left acetabulum. The appearance favors a subacute fracture. 3. Stable orthopedic hardware involving the left lower extremity. 4. Nonspecific soft tissue swelling about the left lower extremity. Electronically Signed   By: Constance Holster M.D.   On: 09/01/2019 21:12   Dg Femur Min 2 Views Left  Result Date: 09/01/2019 CLINICAL DATA:  Left hip pain. EXAM: LEFT FEMUR 2  VIEWS COMPARISON:  Left femur x-rays dated July 26, 2019. FINDINGS: No acute fracture or dislocation. Healed left intertrochanteric fracture status post gamma nail fixation. No evidence of hardware failure or loosening. The left hip joint space is preserved. Osteopenia. Soft tissues are unremarkable. IMPRESSION: 1.  No acute osseous abnormality. 2. Healed left intertrochanteric fracture status post ORIF. Electronically Signed   By: Titus Dubin M.D.   On: 09/01/2019 16:02    LOS: 3 days   Time spent: More than 50% of that time was spent in counseling and/or coordination of care.  Antonieta Pert, MD [facility]  09/03/2019, 1:10 PM

## 2019-09-03 NOTE — Care Management Important Message (Signed)
Important Message  Patient Details  Name: Angelica Bell MRN: [id number] Date of Birth: 1932-02-03   Medicare Important Message Given:  Yes     Rumi Kolodziej Montine Circle 09/03/2019, 4:26 PM

## 2019-09-03 NOTE — Progress Notes (Signed)
Patient complain of rectal pain, her buttock is red and inflamed. Rectal pouch removed per patient complaining and request., patient had less than 100 oc liquid stool total for today.

## 2019-09-04 LAB — CBC
HCT: 34.3 % — ABNORMAL LOW (ref 36.0–46.0)
Hemoglobin: 11.3 g/dL — ABNORMAL LOW (ref 12.0–15.0)
MCH: 27.9 pg (ref 26.0–34.0)
MCHC: 32.9 g/dL (ref 30.0–36.0)
MCV: 84.7 fL (ref 80.0–100.0)
Platelets: 214 10*3/uL (ref 150–400)
RBC: 4.05 MIL/uL (ref 3.87–5.11)
RDW: 14.2 % (ref 11.5–15.5)
WBC: 17.3 10*3/uL — ABNORMAL HIGH (ref 4.0–10.5)
nRBC: 0 % (ref 0.0–0.2)

## 2019-09-04 LAB — COMPREHENSIVE METABOLIC PANEL
ALT: 268 U/L — ABNORMAL HIGH (ref 0–44)
AST: 250 U/L — ABNORMAL HIGH (ref 15–41)
Albumin: 2.3 g/dL — ABNORMAL LOW (ref 3.5–5.0)
Alkaline Phosphatase: 81 U/L (ref 38–126)
Anion gap: 11 (ref 5–15)
BUN: 20 mg/dL (ref 8–23)
CO2: 20 mmol/L — ABNORMAL LOW (ref 22–32)
Calcium: 8.1 mg/dL — ABNORMAL LOW (ref 8.9–10.3)
Chloride: 108 mmol/L (ref 98–111)
Creatinine, Ser: 1.35 mg/dL — ABNORMAL HIGH (ref 0.44–1.00)
GFR calc Af Amer: 41 mL/min — ABNORMAL LOW (ref >60)
GFR calc non Af Amer: 36 mL/min — ABNORMAL LOW (ref >60)
Glucose, Bld: 93 mg/dL (ref 70–99)
Potassium: 3.1 mmol/L — ABNORMAL LOW (ref 3.5–5.1)
Sodium: 139 mmol/L (ref 135–145)
Total Bilirubin: 1 mg/dL (ref 0.3–1.2)
Total Protein: 4.5 g/dL — ABNORMAL LOW (ref 6.5–8.1)

## 2019-09-04 LAB — GLUCOSE, CAPILLARY
Glucose-Capillary: 103 mg/dL — ABNORMAL HIGH (ref 70–99)
Glucose-Capillary: 89 mg/dL (ref 70–99)

## 2019-09-04 LAB — CK: Total CK: 5390 U/L — ABNORMAL HIGH (ref 38–234)

## 2019-09-04 MED ORDER — HYDRALAZINE HCL 20 MG/ML IJ SOLN
5.0000 mg | Freq: Three times a day (TID) | INTRAMUSCULAR | Status: DC | PRN
  Administered 2019-09-04 – 2019-09-05 (×2): 5 mg via INTRAVENOUS
  Filled 2019-09-04 (×3): qty 1

## 2019-09-04 MED ORDER — TRAMADOL HCL 50 MG PO TABS
50.0000 mg | ORAL_TABLET | Freq: Once | ORAL | Status: AC
  Administered 2019-09-04: 05:00:00 50 mg via ORAL
  Filled 2019-09-04: qty 1

## 2019-09-04 MED ORDER — KETOROLAC TROMETHAMINE 15 MG/ML IJ SOLN
15.0000 mg | Freq: Once | INTRAMUSCULAR | Status: AC
  Administered 2019-09-04: 15:00:00 15 mg via INTRAVENOUS
  Filled 2019-09-04: qty 1

## 2019-09-04 MED ORDER — POTASSIUM CHLORIDE CRYS ER 20 MEQ PO TBCR
40.0000 meq | EXTENDED_RELEASE_TABLET | Freq: Two times a day (BID) | ORAL | Status: AC
  Administered 2019-09-04 (×2): 40 meq via ORAL
  Filled 2019-09-04 (×2): qty 2

## 2019-09-04 NOTE — Progress Notes (Signed)
   09/04/19 1248  Vitals  BP (!) 199/95  MAP (mmHg) 126   PRN hydralazine administered. Will continue to monitor.  Hiram Comber, RN 09/04/2019 1:00 PM

## 2019-09-04 NOTE — Progress Notes (Signed)
   Vital Signs MEWS/VS Documentation      09/04/2019 0802 09/04/2019 1248 09/04/2019 1336 09/04/2019 1620   MEWS Score:  0  0  0  2   MEWS Score Color:  Green  Green  Green  Yellow   Resp:  -  16  -  (!) 22   Pulse:  -  87  93  -   BP:  -  (!) 199/95  (!) 166/93  -   Temp:  -  99 F (37.2 C)  -  -       Yellow MEWS protocol initiated; vital signs to be obtained Q2 for Q2.     Donelle Hise L Marysue Fait 09/04/2019,6:51 PM

## 2019-09-04 NOTE — Progress Notes (Addendum)
PROGRESS NOTE  Angelica Bell [id number] DOB: [birth year] DOA: 08/31/2019 PCP: Heywood Bene, PA-C  HPI/Recap of past 73 hours: 85 year old female with past medical history of prior CVA, hypertension, arthritis, chronic pain most recently discharged 8/8 following acute CVA, dehydration, rhabdomyolysis acute renal failure who was found down at home by the grand son. Patinet was back home from SNF last thursday , was found on floor on Friday morning by grandson. He went to check onTuesday morning (9/1) and found her on floor and he called EMS and she was brought to ER. Apparently fell in bathroom, took over an hour to get her bearings and dragged herself out toward phone.Patient responded very well to narcan and narcan drip. She did not respond to fluid resuscitation in terms of Bps so PCCM asked to admit for hypotension. She also had  rhabdo,aki, lactic acidosis, borderline UA, leucocytosis CT head no acute finding. Utox with benzos and opiates. Not sure where benzodiazepines came from but note history of taking too many xanax back in 2012. Per PCCM overall impression here is she is over-medicated at home, fell, and had rhabdo/AKI from prolonged downtime and poor PO. Also has fecal impaction, deconditioning, elevated ammonia and AST/ALT pattern that could be indicative of EtOH use although she denies. Shock seems more hypovolemic given response to fluids. Questionable UTI but I guess will treat with AMS, leukocytosis, and UA, d/c abx if Pct <0.5.Patient's blood pressure improved and she was transferred to [facility] service 9/3. Patient hydrated, AKI, rhabdomyolysis improved Continue to complain of left leg pain CT scan obtained that showed nondisplaced fracture of the left pubic bone.  09/04/19: Patient was seen and examined at her bedside this morning.  She is very hard of hearing.  Reports tenderness with palpation of her right breast.  Bruising noted at site.  Assessment/Plan: Active  Problems:   ANXIETY DEPRESSION   Chronic pain   Essential hypertension   Abnormal liver function   Shock ([place])   UTI (urinary tract infection)   Rhabdomyolysis  Resolved acute metabolic encephalopathy likely multifactorial secondary to UTI, metabolic abnormalities and opiates Mental status appears improved. Continue to monitor and redirect as needed  Rhabdomyolysis setting of fall and prolonged time laying on the floor  BP trending up Continue IV fluid hydration Continue daily CPK and BMP Continue to monitor urine output  Pseudomonal UTI Continue cefepime  Hypokalemia Potassium 3.1 Repleted as indicated  AKI on CKD 3 Her baseline creatinine appears to be 1.3 with GFR of 36 Currently at her baseline Presented with creatinine of 1.8 with GFR of 25 Continue to avoid nephrotoxins Continue IV fluid hydration in the setting of rhabdomyolysis Continue to monitor urine output Repeat BMP in the morning  Left pubic bone fracture post fall Optimize pain control  Elevated LFTs, improving Elevated ALT and AST, levels are trending down  Anxiety Continue escitalopram  GERD Continue PPI  Hyperlipidemia Continue statin   DVT prophylaxis:Heparin Code Status: Full code Family Communication: plan of care discussed with patient in detail. I had updated Mr Case, patient's grandson. He is agreeable for SNF once stable. Disposition Plan: Remains inpatient pending clinical improvement. Will need SNF.  Downgraded to [employer] 9/3  Consultants:  CCM  Procedures: CT brain 9/1:Evolutionary findings in the small white matter infarcts in the left occipital lobe right parietal lobe, currently hypodense indicating increased chronicity. Both of these were subacute at the time of prior brain MRI of 07/27/2019. 2. Old right cerebellar infarct. 3. Periventricular white matter  and corona radiata hypodensities favor chronic ischemic microvascular white matter disease. 4. No acute findings.  CT chest abd/pelvis 9/1:Large sliding-type hiatal hernia.1.6 cm left thyroid nodule is noted. Thyroid ultrasound is recommended for further evaluation. Large amount of stool is seen in sigmoid colon and rectum concerning for possible impaction.Bibasilar bronchiectatic changes near the area of hiatal hernia  CT left hip9/2 IMPRESSION: 1. Nondisplaced fracture of the left parasymphyseal pubic bone. 2. Age-indeterminate nondisplaced fracture involving the anterior column of the left acetabulum. The appearance favors a subacute fracture. 3. Stable orthopedic hardware involving the left lower extremity. 4. Nonspecific soft tissue swelling about the left lower extremity.  Microbiology:  UC 9/1>>> Pseudomonas BCX2 9/1>>>NGTD   Objective: Vitals:   09/03/19 2300 09/04/19 0500 09/04/19 0532 09/04/19 0644  BP: (!) 155/78  (!) 169/84 (!) 175/83  Pulse:   79 79  Resp:   16   Temp:   97.6 F (36.4 C)   TempSrc:      SpO2:   98%   Weight:  60.3 kg    Height:        Intake/Output Summary (Last 24 hours) at 09/04/2019 1226 Last data filed at 09/04/2019 0920 Gross per 24 hour  Intake 1998.09 ml  Output 6450 ml  Net -4451.91 ml   Filed Weights   09/02/19 0500 09/03/19 0500 09/04/19 0500  Weight: 53.4 kg 63.7 kg 60.3 kg    Exam:  . General: 85 y.o. year-old female well developed well nourished in no acute distress.  Alert and interactive.  Very hard of hearing. . Cardiovascular: Regular rate and rhythm with no rubs or gallops.  No thyromegaly or JVD noted.  Bruising and tenderness noted on right breast . Respiratory: Clear to auscultation with no wheezes or rales. Good inspiratory effort. . Abdomen: Soft nontender nondistended with normal bowel sounds x4 quadrants. . Musculoskeletal: Trace lower extremity edema. 2/4 pulses in all 4 extremities. Marland Kitchen Psychiatry: Mood is appropriate for condition and setting   Data Reviewed: CBC: Recent Labs  Lab 08/31/19 0841 09/01/19 0236  09/02/19 0241 09/03/19 0237 09/04/19 0314  WBC 29.8* 24.5* 14.1* 13.2* 17.3*  NEUTROABS 25.9*  --   --   --   --   HGB 10.2* 11.2* 9.7* 10.6* 11.3*  HCT 34.7* 36.7 30.8* 32.4* 34.3*  MCV 98.0 93.1 90.9 88.0 84.7  PLT 236 242 170 202 [id number]   Basic Metabolic Panel: Recent Labs  Lab 08/31/19 0741 08/31/19 0753 09/01/19 0236 09/02/19 0241 09/03/19 0237 09/04/19 0314  NA 140 137 138 139 140 139  K 5.2* 5.1 4.7 4.1 3.6 3.1*  CL 103 104 106 110 112* 108  CO2 19*  --  21* 21* 23 20*  GLUCOSE 108* 99 112* 94 103* 93  BUN 27* 39* 34* 31* 27* 20  CREATININE 2.42* 2.20* 1.82* 1.66* 1.52* 1.35*  CALCIUM 8.9  --  7.6* 7.7* 8.2* 8.1*  MG 2.3  --  1.7  --   --   --   PHOS 10.3*  --  4.7*  --   --   --    GFR: Estimated Creatinine Clearance: 28.5 mL/min (A) (by C-G formula based on SCr of 1.35 mg/dL (H)). Liver Function Tests: Recent Labs  Lab 08/31/19 0741 09/01/19 0236 09/02/19 0241 09/03/19 0237 09/04/19 0314  AST 118* 666* 354* 283* 250*  ALT 64* 502* 362* 320* 268*  ALKPHOS 88 90 67 68 81  BILITOT 0.5 0.4 0.4 0.6 1.0  PROT 6.4* 4.3* 3.8* 4.2* 4.5*  ALBUMIN 3.7 2.3* 2.0* 2.1* 2.3*   No results for input(s): LIPASE, AMYLASE in the last 168 hours. Recent Labs  Lab 08/31/19 0741 09/03/19 0237  AMMONIA 64* 15   Coagulation Profile: Recent Labs  Lab 08/31/19 0741  INR 1.2   Cardiac Enzymes: Recent Labs  Lab 08/31/19 0741 09/01/19 0642 09/02/19 0241 09/03/19 0237 09/04/19 0314  CKTOTAL 3,175* 18,996* 8,700* 4,115* 5,390*   BNP (last 3 results) No results for input(s): PROBNP in the last 8760 hours. HbA1C: No results for input(s): HGBA1C in the last 72 hours. CBG: Recent Labs  Lab 09/03/19 0017 09/03/19 0517 09/03/19 0825 09/04/19 0118 09/04/19 0533  GLUCAP 112* 97 94 103* 89   Lipid Profile: No results for input(s): CHOL, HDL, LDLCALC, TRIG, CHOLHDL, LDLDIRECT in the last 72 hours. Thyroid Function Tests: No results for input(s): TSH, T4TOTAL,  FREET4, T3FREE, THYROIDAB in the last 72 hours. Anemia Panel: No results for input(s): VITAMINB12, FOLATE, FERRITIN, TIBC, IRON, RETICCTPCT in the last 72 hours. Urine analysis:    Component Value Date/Time   COLORURINE YELLOW 08/31/2019 0745   APPEARANCEUR HAZY (A) 08/31/2019 0745   LABSPEC 1.013 08/31/2019 0745   PHURINE 5.0 08/31/2019 0745   GLUCOSEU NEGATIVE 08/31/2019 0745   HGBUR LARGE (A) 08/31/2019 0745   HGBUR negative 04/24/2009 1635   BILIRUBINUR NEGATIVE 08/31/2019 0745   BILIRUBINUR negative 05/29/2011 1439   KETONESUR NEGATIVE 08/31/2019 0745   PROTEINUR 30 (A) 08/31/2019 0745   UROBILINOGEN 0.2 01/26/2014 1912   NITRITE NEGATIVE 08/31/2019 0745   LEUKOCYTESUR SMALL (A) 08/31/2019 0745   Sepsis Labs: @LABRCNTIP (procalcitonin:4,lacticidven:4)  ) Recent Results (from the past 240 hour(s))  Blood Culture (routine x 2)     Status: None (Preliminary result)   Collection Time: 08/31/19  7:30 AM   Specimen: BLOOD  Result Value Ref Range Status   Specimen Description BLOOD LEFT ANTECUBITAL  Final   Special Requests   Final    BOTTLES DRAWN AEROBIC AND ANAEROBIC Blood Culture adequate volume   Culture   Final    NO GROWTH 3 DAYS Performed at [facility], [place] [address], [place], [place] [address]    Report Status PENDING  Incomplete  Blood Culture (routine x 2)     Status: None (Preliminary result)   Collection Time: 08/31/19  7:39 AM   Specimen: BLOOD RIGHT FOREARM  Result Value Ref Range Status   Specimen Description BLOOD RIGHT FOREARM  Final   Special Requests   Final    BOTTLES DRAWN AEROBIC AND ANAEROBIC Blood Culture results may not be optimal due to an inadequate volume of blood received in culture bottles   Culture   Final    NO GROWTH 3 DAYS Performed at [facility], [place] [address], [place],  [address]    Report Status PENDING  Incomplete  SARS Coronavirus 2 [facility] order, Performed in [facility] hospital lab) Nasopharyngeal  Nasopharyngeal Swab     Status: None   Collection Time: 08/31/19  8:00 AM   Specimen: Nasopharyngeal Swab  Result Value Ref Range Status   SARS Coronavirus 2 NEGATIVE NEGATIVE Final    Comment: (NOTE) If result is NEGATIVE SARS-CoV-2 target nucleic acids are NOT DETECTED. The SARS-CoV-2 RNA is generally detectable in upper and lower  respiratory specimens during the acute phase of infection. The lowest  concentration of SARS-CoV-2 viral copies this assay can detect is 250  copies / mL. A negative result does not preclude SARS-CoV-2 infection  and should not  be used as the sole basis for treatment or other  patient management decisions.  A negative result may occur with  improper specimen collection / handling, submission of specimen other  than nasopharyngeal swab, presence of viral mutation(s) within the  areas targeted by this assay, and inadequate number of viral copies  (<250 copies / mL). A negative result must be combined with clinical  observations, patient history, and epidemiological information. If result is POSITIVE SARS-CoV-2 target nucleic acids are DETECTED. The SARS-CoV-2 RNA is generally detectable in upper and lower  respiratory specimens dur ing the acute phase of infection.  Positive  results are indicative of active infection with SARS-CoV-2.  Clinical  correlation with patient history and other diagnostic information is  necessary to determine patient infection status.  Positive results do  not rule out bacterial infection or co-infection with other viruses. If result is PRESUMPTIVE POSTIVE SARS-CoV-2 nucleic acids MAY BE PRESENT.   A presumptive positive result was obtained on the submitted specimen  and confirmed on repeat testing.  While 2019 novel coronavirus  (SARS-CoV-2) nucleic acids may be present in the submitted sample  additional confirmatory testing may be necessary for epidemiological  and / or clinical management purposes  to differentiate between   SARS-CoV-2 and other Sarbecovirus currently known to infect humans.  If clinically indicated additional testing with an alternate test  methodology [phone]) is advised. The SARS-CoV-2 RNA is generally  detectable in upper and lower respiratory sp ecimens during the acute  phase of infection. The expected result is Negative. Fact Sheet for Patients:  StrictlyIdeas.no Fact Sheet for Healthcare Providers: BankingDealers.co.za This test is not yet approved or cleared by the [place] FDA and has been authorized for detection and/or diagnosis of SARS-CoV-2 by FDA under an Emergency Use Authorization (EUA).  This EUA will remain in effect (meaning this test can be used) for the duration of the COVID-19 declaration under Section 564(b)(1) of the Act, 21 U.S.C. section 360bbb-3(b)(1), unless the authorization is terminated or revoked sooner. Performed at [facility], [place] [address] [place], [place] [address]   Urine culture     Status: Abnormal   Collection Time: 08/31/19  9:20 AM   Specimen: In/Out Cath Urine  Result Value Ref Range Status   Specimen Description IN/OUT CATH URINE  Final   Special Requests   Final    NONE Performed at [facility], [place] [address], [place], [place] [address]    Culture >=100,000 COLONIES/mL PSEUDOMONAS AERUGINOSA (A)  Final   Report Status 09/02/2019 FINAL  Final   Organism ID, Bacteria PSEUDOMONAS AERUGINOSA (A)  Final      Susceptibility   Pseudomonas aeruginosa - MIC*    CEFTAZIDIME 2 SENSITIVE Sensitive     CIPROFLOXACIN >=4 RESISTANT Resistant     GENTAMICIN 4 SENSITIVE Sensitive     IMIPENEM <=0.25 SENSITIVE Sensitive     PIP/TAZO <=4 SENSITIVE Sensitive     CEFEPIME 2 SENSITIVE Sensitive     * >=100,000 COLONIES/mL PSEUDOMONAS AERUGINOSA  MRSA PCR Screening     Status: None   Collection Time: 08/31/19  2:28 PM   Specimen: Nasal Mucosa; Nasopharyngeal  Result Value Ref Range  Status   MRSA by PCR NEGATIVE NEGATIVE Final    Comment:        The GeneXpert MRSA Assay (FDA approved for NASAL specimens only), is one component of a comprehensive MRSA colonization surveillance program. It is not intended to diagnose MRSA infection nor to guide  or monitor treatment for MRSA infections. Performed at [facility], [place] [address], [place], [place] [address]       Studies: No results found.  Scheduled Meds: . aspirin EC  81 mg Oral Daily  . bisacodyl  10 mg Rectal Once  . Chlorhexidine Gluconate Cloth  6 each Topical Daily  . docusate sodium  100 mg Oral BID  . escitalopram  20 mg Oral Daily  . heparin  5,000 Units Subcutaneous Q8H  . pantoprazole  40 mg Oral Daily  . potassium chloride  40 mEq Oral BID    Continuous Infusions: . sodium chloride 75 mL/hr at 09/04/19 0258  . ceFEPime (MAXIPIME) IV 2 g (09/04/19 1023)  . lactated ringers Stopped (09/02/19 0800)     LOS: 4 days     Kayleen Memos, MD [facility] Pager [phone]  If 7PM-7AM, please contact night-coverage [email or web address] Password [email or web address] 09/04/2019, 12:26 PM

## 2019-09-04 NOTE — Progress Notes (Addendum)
Pt BP elevated 175/83, HR 79. Blount, NP notified. Order for PRN hydralazine placed for BP greater than 180.

## 2019-09-04 NOTE — Progress Notes (Signed)
Patient reports headache pain, 7/10. Last pain medication administered @ 1016. Due to Oxycodone parameters, Dr. [place] Crane was paged for additional pain management option.

## 2019-09-05 DIAGNOSIS — G8928 Other chronic postprocedural pain: Secondary | ICD-10-CM

## 2019-09-05 LAB — BASIC METABOLIC PANEL
Anion gap: 12 (ref 5–15)
BUN: 19 mg/dL (ref 8–23)
CO2: 20 mmol/L — ABNORMAL LOW (ref 22–32)
Calcium: 8.2 mg/dL — ABNORMAL LOW (ref 8.9–10.3)
Chloride: 109 mmol/L (ref 98–111)
Creatinine, Ser: 1.24 mg/dL — ABNORMAL HIGH (ref 0.44–1.00)
GFR calc Af Amer: 46 mL/min — ABNORMAL LOW (ref >60)
GFR calc non Af Amer: 40 mL/min — ABNORMAL LOW (ref >60)
Glucose, Bld: 118 mg/dL — ABNORMAL HIGH (ref 70–99)
Potassium: 3.4 mmol/L — ABNORMAL LOW (ref 3.5–5.1)
Sodium: 141 mmol/L (ref 135–145)

## 2019-09-05 LAB — CULTURE, BLOOD (ROUTINE X 2)
Culture: NO GROWTH
Culture: NO GROWTH
Special Requests: ADEQUATE

## 2019-09-05 LAB — CK: Total CK: 5310 U/L — ABNORMAL HIGH (ref 38–234)

## 2019-09-05 LAB — GLUCOSE, CAPILLARY: Glucose-Capillary: 131 mg/dL — ABNORMAL HIGH (ref 70–99)

## 2019-09-05 MED ORDER — POTASSIUM CHLORIDE CRYS ER 20 MEQ PO TBCR
40.0000 meq | EXTENDED_RELEASE_TABLET | Freq: Once | ORAL | Status: AC
  Administered 2019-09-05: 40 meq via ORAL
  Filled 2019-09-05: qty 2

## 2019-09-05 MED ORDER — TRAZODONE HCL 50 MG PO TABS
50.0000 mg | ORAL_TABLET | Freq: Once | ORAL | Status: AC
  Administered 2019-09-05: 22:00:00 50 mg via ORAL
  Filled 2019-09-05: qty 1

## 2019-09-05 MED ORDER — AMLODIPINE BESYLATE 5 MG PO TABS
5.0000 mg | ORAL_TABLET | Freq: Once | ORAL | Status: AC
  Administered 2019-09-05: 18:00:00 5 mg via ORAL
  Filled 2019-09-05: qty 1

## 2019-09-05 MED ORDER — AMLODIPINE BESYLATE 5 MG PO TABS
5.0000 mg | ORAL_TABLET | Freq: Every day | ORAL | Status: DC
  Administered 2019-09-05: 07:00:00 5 mg via ORAL
  Filled 2019-09-05: qty 1

## 2019-09-05 MED ORDER — LABETALOL HCL 5 MG/ML IV SOLN
5.0000 mg | INTRAVENOUS | Status: DC | PRN
  Administered 2019-09-05 – 2019-09-08 (×4): 5 mg via INTRAVENOUS
  Filled 2019-09-05 (×4): qty 4

## 2019-09-05 MED ORDER — HYDRALAZINE HCL 20 MG/ML IJ SOLN
5.0000 mg | Freq: Once | INTRAMUSCULAR | Status: AC
  Administered 2019-09-05: 5 mg via INTRAVENOUS

## 2019-09-05 MED ORDER — AMLODIPINE BESYLATE 10 MG PO TABS
10.0000 mg | ORAL_TABLET | Freq: Every day | ORAL | Status: DC
  Administered 2019-09-06 – 2019-09-15 (×10): 10 mg via ORAL
  Filled 2019-09-05 (×10): qty 1

## 2019-09-05 NOTE — Progress Notes (Signed)
PROGRESS NOTE  KENYATA SAYWARD [id number] DOB: 06/25/1932 DOA: 08/31/2019 PCP: Heywood Bene, PA-C  HPI/Recap of past 50 hours: 85 year old female with past medical history of prior CVA, hypertension, arthritis, chronic pain most recently discharged 8/8 following acute CVA, dehydration, rhabdomyolysis acute renal failure who was found down at home by the grand son. Patinet was back home from SNF last thursday , was found on floor on Friday morning by grandson. He went to check onTuesday morning (9/1) and found her on floor and he called EMS and she was brought to ER. Apparently fell in bathroom, took over an hour to get her bearings and dragged herself out toward phone.Patient responded very well to narcan and narcan drip. She did not respond to fluid resuscitation in terms of Bps so PCCM asked to admit for hypotension. She also had  rhabdo,aki, lactic acidosis, borderline UA, leucocytosis CT head no acute finding. Utox with benzos and opiates. Not sure where benzodiazepines came from but note history of taking too many xanax back in 2012. Per PCCM overall impression here is she is over-medicated at home, fell, and had rhabdo/AKI from prolonged downtime and poor PO. Also has fecal impaction, deconditioning, elevated ammonia and AST/ALT pattern that could be indicative of EtOH use although she denies. Shock seems more hypovolemic given response to fluids. Questionable UTI but I guess will treat with AMS, leukocytosis, and UA, d/c abx if Pct <0.5.Patient's blood pressure improved and she was transferred to [facility] service 9/3. Patient hydrated, AKI, rhabdomyolysis improved Continue to complain of left leg pain CT scan obtained that showed nondisplaced fracture of the left pubic bone.  09/05/19: Patient seen and examined at her bedside this morning.  She is alert, confused but redirectable.   Assessment/Plan: Active Problems:   ANXIETY DEPRESSION   Chronic pain   Essential hypertension  Abnormal liver function   Shock ([place])   UTI (urinary tract infection)   Rhabdomyolysis  Acute metabolic encephalopathy/delirium likely multifactorial secondary to UTI, metabolic abnormalities and opiates Mental status appears improved. Continue to monitor and redirect as needed  Rhabdomyolysis setting of fall and prolonged time laying on the floor  BP trending up Continue IV fluid hydration Continue daily CPK and BMP Continue to monitor urine output EKG 5310 from 5390 yesterday Continue normal saline at 75 cc/h  Uncontrolled hypertension Not on blood pressure medications at home Started Norvasc 5 mg daily Continue to closely monitor vital signs  Pseudomonal UTI Continue cefepime Obtain CBC in the morning  Hypokalemia, persistent Potassium 3.1>> 3.4 Replete as indicated  AKI on CKD 3 Currently appears to be at her baseline creatinine 1.24 with GFR 40 Continue to avoid nephrotoxins Presented with creatinine of 1.8 with GFR of 25 Continue IV fluid hydration in the setting of rhabdomyolysis Continue to monitor urine output BMP in a.m. Add magnesium level  Left pubic bone fracture post fall Optimize pain control  Elevated LFTs, improving Elevated ALT and AST, levels continue to trend down Acute hepatitis panel negative  Anxiety Continue escitalopram  GERD Continue PPI  Hyperlipidemia Continue statin   DVT prophylaxis:Heparin Code Status: Full code Family Communication: plan of care discussed with patient in detail. I had updated Mr Case, patient's grandson. He is agreeable for SNF once stable.    Disposition Plan:  Discharge is not appropriate at this time due to acute metabolic encephalopathy and persistent rhabdomyolysis.  Patient will require continuous IV fluid hydration to avoid kidney injury and worsening renal function.  Consultants:  CCM  Procedures: CT brain 9/1:Evolutionary findings in the small white matter infarcts in the left occipital lobe  right parietal lobe, currently hypodense indicating increased chronicity. Both of these were subacute at the time of prior brain MRI of 07/27/2019. 2. Old right cerebellar infarct. 3. Periventricular white matter and corona radiata hypodensities favor chronic ischemic microvascular white matter disease. 4. No acute findings. CT chest abd/pelvis 9/1:Large sliding-type hiatal hernia.1.6 cm left thyroid nodule is noted. Thyroid ultrasound is recommended for further evaluation. Large amount of stool is seen in sigmoid colon and rectum concerning for possible impaction.Bibasilar bronchiectatic changes near the area of hiatal hernia  CT left hip9/2 IMPRESSION: 1. Nondisplaced fracture of the left parasymphyseal pubic bone. 2. Age-indeterminate nondisplaced fracture involving the anterior column of the left acetabulum. The appearance favors a subacute fracture. 3. Stable orthopedic hardware involving the left lower extremity. 4. Nonspecific soft tissue swelling about the left lower extremity.  Microbiology:  UC 9/1>>> Pseudomonas BCX2 9/1>>>NGTD   Objective: Vitals:   09/05/19 0644 09/05/19 1016 09/05/19 1055 09/05/19 1201  BP: (!) 188/101  (!) 176/99 (!) 163/93  Pulse:   (!) 101 100  Resp:  (!) 28 (!) 34 (!) 28  Temp:    98.7 F (37.1 C)  TempSrc:    Oral  SpO2:   96% 97%  Weight:      Height:        Intake/Output Summary (Last 24 hours) at 09/05/2019 1420 Last data filed at 09/05/2019 1019 Gross per 24 hour  Intake 2337.55 ml  Output 2400 ml  Net -62.45 ml   Filed Weights   09/02/19 0500 09/03/19 0500 09/04/19 0500  Weight: 53.4 kg 63.7 kg 60.3 kg    Exam:  . General: 85 y.o. year-old female frail-appearing no acute distress.  Confused but easily redirectable. . Cardiovascular: Rate and rhythm no rubs or gallops murmurs radial thyromegaly noted.  Bruising and tenderness noted on right breast. . Respiratory: Clear to auscultation no wheezes or rales.  Poor inspiratory  effort. . Abdomen: Soft nontender nondistended normal bowel sounds present.  Musculoskeletal: Trace lower extremity edema bilaterally.  2 out of 4 pulses in all 4 extremities.   Marland Kitchen Psychiatry: Mood is appropriate for condition and setting.   Data Reviewed: CBC: Recent Labs  Lab 08/31/19 0841 09/01/19 0236 09/02/19 0241 09/03/19 0237 09/04/19 0314  WBC 29.8* 24.5* 14.1* 13.2* 17.3*  NEUTROABS 25.9*  --   --   --   --   HGB 10.2* 11.2* 9.7* 10.6* 11.3*  HCT 34.7* 36.7 30.8* 32.4* 34.3*  MCV 98.0 93.1 90.9 88.0 84.7  PLT 236 242 170 202 [id number]   Basic Metabolic Panel: Recent Labs  Lab 08/31/19 0741  09/01/19 0236 09/02/19 0241 09/03/19 0237 09/04/19 0314 09/05/19 0410  NA 140   < > 138 139 140 139 141  K 5.2*   < > 4.7 4.1 3.6 3.1* 3.4*  CL 103   < > 106 110 112* 108 109  CO2 19*  --  21* 21* 23 20* 20*  GLUCOSE 108*   < > 112* 94 103* 93 118*  BUN 27*   < > 34* 31* 27* 20 19  CREATININE 2.42*   < > 1.82* 1.66* 1.52* 1.35* 1.24*  CALCIUM 8.9  --  7.6* 7.7* 8.2* 8.1* 8.2*  MG 2.3  --  1.7  --   --   --   --   PHOS 10.3*  --  4.7*  --   --   --   --    < > =  values in this interval not displayed.   GFR: Estimated Creatinine Clearance: 31.1 mL/min (A) (by C-G formula based on SCr of 1.24 mg/dL (H)). Liver Function Tests: Recent Labs  Lab 08/31/19 0741 09/01/19 0236 09/02/19 0241 09/03/19 0237 09/04/19 0314  AST 118* 666* 354* 283* 250*  ALT 64* 502* 362* 320* 268*  ALKPHOS 88 90 67 68 81  BILITOT 0.5 0.4 0.4 0.6 1.0  PROT 6.4* 4.3* 3.8* 4.2* 4.5*  ALBUMIN 3.7 2.3* 2.0* 2.1* 2.3*   No results for input(s): LIPASE, AMYLASE in the last 168 hours. Recent Labs  Lab 08/31/19 0741 09/03/19 0237  AMMONIA 64* 15   Coagulation Profile: Recent Labs  Lab 08/31/19 0741  INR 1.2   Cardiac Enzymes: Recent Labs  Lab 09/01/19 0642 09/02/19 0241 09/03/19 0237 09/04/19 0314 09/05/19 0410  CKTOTAL 18,996* 8,700* 4,115* 5,390* 5,310*   BNP (last 3 results) No  results for input(s): PROBNP in the last 8760 hours. HbA1C: No results for input(s): HGBA1C in the last 72 hours. CBG: Recent Labs  Lab 09/03/19 0517 09/03/19 0825 09/04/19 0118 09/04/19 0533 09/05/19 1101  GLUCAP 97 94 103* 89 131*   Lipid Profile: No results for input(s): CHOL, HDL, LDLCALC, TRIG, CHOLHDL, LDLDIRECT in the last 72 hours. Thyroid Function Tests: No results for input(s): TSH, T4TOTAL, FREET4, T3FREE, THYROIDAB in the last 72 hours. Anemia Panel: No results for input(s): VITAMINB12, FOLATE, FERRITIN, TIBC, IRON, RETICCTPCT in the last 72 hours. Urine analysis:    Component Value Date/Time   COLORURINE YELLOW 08/31/2019 0745   APPEARANCEUR HAZY (A) 08/31/2019 0745   LABSPEC 1.013 08/31/2019 0745   PHURINE 5.0 08/31/2019 0745   GLUCOSEU NEGATIVE 08/31/2019 0745   HGBUR LARGE (A) 08/31/2019 0745   HGBUR negative 04/24/2009 1635   BILIRUBINUR NEGATIVE 08/31/2019 0745   BILIRUBINUR negative 05/29/2011 1439   KETONESUR NEGATIVE 08/31/2019 0745   PROTEINUR 30 (A) 08/31/2019 0745   UROBILINOGEN 0.2 01/26/2014 1912   NITRITE NEGATIVE 08/31/2019 0745   LEUKOCYTESUR SMALL (A) 08/31/2019 0745   Sepsis Labs: @LABRCNTIP (procalcitonin:4,lacticidven:4)  ) Recent Results (from the past 240 hour(s))  Blood Culture (routine x 2)     Status: None   Collection Time: 08/31/19  7:30 AM   Specimen: BLOOD  Result Value Ref Range Status   Specimen Description BLOOD LEFT ANTECUBITAL  Final   Special Requests   Final    BOTTLES DRAWN AEROBIC AND ANAEROBIC Blood Culture adequate volume   Culture   Final    NO GROWTH 5 DAYS Performed at [facility], [place] [address] [place], [place] [address]    Report Status 09/05/2019 FINAL  Final  Blood Culture (routine x 2)     Status: None   Collection Time: 08/31/19  7:39 AM   Specimen: BLOOD RIGHT FOREARM  Result Value Ref Range Status   Specimen Description BLOOD RIGHT FOREARM  Final   Special Requests   Final    BOTTLES  DRAWN AEROBIC AND ANAEROBIC Blood Culture results may not be optimal due to an inadequate volume of blood received in culture bottles   Culture   Final    NO GROWTH 5 DAYS Performed at [facility], [place] [address] [place], [place] [address]    Report Status 09/05/2019 FINAL  Final  SARS Coronavirus 2 [facility] order, Performed in [facility] hospital lab) Nasopharyngeal Nasopharyngeal Swab     Status: None   Collection Time: 08/31/19  8:00 AM   Specimen: Nasopharyngeal Swab  Result Value  Ref Range Status   SARS Coronavirus 2 NEGATIVE NEGATIVE Final    Comment: (NOTE) If result is NEGATIVE SARS-CoV-2 target nucleic acids are NOT DETECTED. The SARS-CoV-2 RNA is generally detectable in upper and lower  respiratory specimens during the acute phase of infection. The lowest  concentration of SARS-CoV-2 viral copies this assay can detect is 250  copies / mL. A negative result does not preclude SARS-CoV-2 infection  and should not be used as the sole basis for treatment or other  patient management decisions.  A negative result may occur with  improper specimen collection / handling, submission of specimen other  than nasopharyngeal swab, presence of viral mutation(s) within the  areas targeted by this assay, and inadequate number of viral copies  (<250 copies / mL). A negative result must be combined with clinical  observations, patient history, and epidemiological information. If result is POSITIVE SARS-CoV-2 target nucleic acids are DETECTED. The SARS-CoV-2 RNA is generally detectable in upper and lower  respiratory specimens dur ing the acute phase of infection.  Positive  results are indicative of active infection with SARS-CoV-2.  Clinical  correlation with patient history and other diagnostic information is  necessary to determine patient infection status.  Positive results do  not rule out bacterial infection or co-infection with other viruses. If result is PRESUMPTIVE  POSTIVE SARS-CoV-2 nucleic acids MAY BE PRESENT.   A presumptive positive result was obtained on the submitted specimen  and confirmed on repeat testing.  While 2019 novel coronavirus  (SARS-CoV-2) nucleic acids may be present in the submitted sample  additional confirmatory testing may be necessary for epidemiological  and / or clinical management purposes  to differentiate between  SARS-CoV-2 and other Sarbecovirus currently known to infect humans.  If clinically indicated additional testing with an alternate test  methodology [phone]) is advised. The SARS-CoV-2 RNA is generally  detectable in upper and lower respiratory sp ecimens during the acute  phase of infection. The expected result is Negative. Fact Sheet for Patients:  StrictlyIdeas.no Fact Sheet for Healthcare Providers: BankingDealers.co.za This test is not yet approved or cleared by the [place] FDA and has been authorized for detection and/or diagnosis of SARS-CoV-2 by FDA under an Emergency Use Authorization (EUA).  This EUA will remain in effect (meaning this test can be used) for the duration of the COVID-19 declaration under Section 564(b)(1) of the Act, 21 U.S.C. section 360bbb-3(b)(1), unless the authorization is terminated or revoked sooner. Performed at [facility], [place] [address] [place],  [address]   Urine culture     Status: Abnormal   Collection Time: 08/31/19  9:20 AM   Specimen: In/Out Cath Urine  Result Value Ref Range Status   Specimen Description IN/OUT CATH URINE  Final   Special Requests   Final    NONE Performed at [facility], [place] [address] [place], [place] [address]    Culture >=100,000 COLONIES/mL PSEUDOMONAS AERUGINOSA (A)  Final   Report Status 09/02/2019 FINAL  Final   Organism ID, Bacteria PSEUDOMONAS AERUGINOSA (A)  Final      Susceptibility   Pseudomonas aeruginosa - MIC*    CEFTAZIDIME 2 SENSITIVE Sensitive      CIPROFLOXACIN >=4 RESISTANT Resistant     GENTAMICIN 4 SENSITIVE Sensitive     IMIPENEM <=0.25 SENSITIVE Sensitive     PIP/TAZO <=4 SENSITIVE Sensitive     CEFEPIME 2 SENSITIVE Sensitive     * >=100,000 COLONIES/mL PSEUDOMONAS AERUGINOSA  MRSA PCR Screening  Status: None   Collection Time: 08/31/19  2:28 PM   Specimen: Nasal Mucosa; Nasopharyngeal  Result Value Ref Range Status   MRSA by PCR NEGATIVE NEGATIVE Final    Comment:        The GeneXpert MRSA Assay (FDA approved for NASAL specimens only), is one component of a comprehensive MRSA colonization surveillance program. It is not intended to diagnose MRSA infection nor to guide or monitor treatment for MRSA infections. Performed at [facility], [place] [address] [place], [place] [address]       Studies: No results found.  Scheduled Meds: . amLODipine  5 mg Oral Daily  . aspirin EC  81 mg Oral Daily  . bisacodyl  10 mg Rectal Once  . Chlorhexidine Gluconate Cloth  6 each Topical Daily  . docusate sodium  100 mg Oral BID  . escitalopram  20 mg Oral Daily  . heparin  5,000 Units Subcutaneous Q8H  . pantoprazole  40 mg Oral Daily    Continuous Infusions: . sodium chloride 75 mL/hr at 09/04/19 0258  . ceFEPime (MAXIPIME) IV 2 g (09/05/19 1006)  . lactated ringers Stopped (09/02/19 0800)     LOS: 5 days     Kayleen Memos, MD [facility] Pager [phone]  If 7PM-7AM, please contact night-coverage [email or web address] Password [email or web address] 09/05/2019, 2:20 PM

## 2019-09-05 NOTE — Progress Notes (Signed)
During patient rounds, patient verbalized pelvic pain 10/10. PRN Oxycodone 10 mg last administered @ 1016. PRN Oxycodone scheduled every 6 hours for severe pain. Dr. [place] Crane paged to get a verbal order to administer PRN Oxycodone prior to 1816. Dr. [place] Crane returned page and gave verbal order to given PRN Oxycodone 10 mg now. Pharmacy was consulted regarding order. No new medication order needs to be written per pharmacy.

## 2019-09-05 NOTE — Progress Notes (Signed)
   09/05/19 1849 09/05/19 1915  Vitals  BP (!) 158/77 (!) 148/80  MAP (mmHg) 102 101   PRN Labetalol administered. Repeat blood pressure 148/80.

## 2019-09-05 NOTE — Progress Notes (Signed)
   09/05/19 1742  Vitals  BP (!) 163/85  MAP (mmHg) 104   One time dose of amlodipine administered at this time. Will re-assess and administer PRN labetalol if blood pressure still remains elevated.

## 2019-09-05 NOTE — Progress Notes (Signed)
   Vital Signs MEWS/VS Documentation      09/05/2019 [id number] 09/05/2019 0644 09/05/2019 0700 09/05/2019 1016   MEWS Score:  0  0  1  3   MEWS Score Color:  Green  Green  Green  Yellow   Resp:  -  -  -  (!) 28   BP:  (!) 188/101  (!) 188/101  -  -      Patient in acute pain and anxious at this time. Pain scale: 9/10 in pelvic region. PRN Oxycodone administered. Yellow MEWS protocol initiated.    [name] 09/05/2019,10:37 AM

## 2019-09-05 NOTE — Progress Notes (Addendum)
0201 B/P 197/107 Hydralazine 10 mg IVP given at this time, will cont to monitor and recheck B/P in 30 minutes.  0223 Pt c/o pain to L pelvic area, medicated with Oxy IR 10 mg po at this time, will cont to monitor.  0230 B/P recheck 172/94. Will cont to monitor.  0323 Unable to re-assess pain at this time-pt asleep.  0545 B/P 188/101 , Provider was notified as pt unable to be medicated d/t time parameters of Hydralazine. Awaiting further instruction.  [place] orders received from Provider to give additional dose of Hydralazine 5mg   Same done and to give am dose of Norvasc 5 mg at 0700, see MAR.

## 2019-09-06 LAB — BASIC METABOLIC PANEL
Anion gap: 10 (ref 5–15)
BUN: 17 mg/dL (ref 8–23)
CO2: 25 mmol/L (ref 22–32)
Calcium: 7.7 mg/dL — ABNORMAL LOW (ref 8.9–10.3)
Chloride: 105 mmol/L (ref 98–111)
Creatinine, Ser: 1.23 mg/dL — ABNORMAL HIGH (ref 0.44–1.00)
GFR calc Af Amer: 46 mL/min — ABNORMAL LOW (ref >60)
GFR calc non Af Amer: 40 mL/min — ABNORMAL LOW (ref >60)
Glucose, Bld: 100 mg/dL — ABNORMAL HIGH (ref 70–99)
Potassium: 2.9 mmol/L — ABNORMAL LOW (ref 3.5–5.1)
Sodium: 140 mmol/L (ref 135–145)

## 2019-09-06 LAB — CK: Total CK: 2775 U/L — ABNORMAL HIGH (ref 38–234)

## 2019-09-06 MED ORDER — TRAMADOL HCL 50 MG PO TABS
50.0000 mg | ORAL_TABLET | Freq: Once | ORAL | Status: AC
  Administered 2019-09-06: 06:00:00 50 mg via ORAL
  Filled 2019-09-06: qty 1

## 2019-09-06 MED ORDER — POTASSIUM CHLORIDE CRYS ER 20 MEQ PO TBCR
40.0000 meq | EXTENDED_RELEASE_TABLET | Freq: Two times a day (BID) | ORAL | Status: AC
  Administered 2019-09-06 (×2): 40 meq via ORAL
  Filled 2019-09-06 (×2): qty 2

## 2019-09-06 MED ORDER — SENNOSIDES-DOCUSATE SODIUM 8.6-50 MG PO TABS
2.0000 | ORAL_TABLET | Freq: Two times a day (BID) | ORAL | Status: DC
  Administered 2019-09-06 – 2019-09-14 (×11): 2 via ORAL
  Filled 2019-09-06 (×17): qty 2

## 2019-09-06 MED ORDER — MAGNESIUM SULFATE 2 GM/50ML IV SOLN
2.0000 g | Freq: Once | INTRAVENOUS | Status: AC
  Administered 2019-09-06: 2 g via INTRAVENOUS
  Filled 2019-09-06: qty 50

## 2019-09-06 MED ORDER — OXYCODONE HCL 5 MG PO TABS
5.0000 mg | ORAL_TABLET | ORAL | Status: DC | PRN
  Administered 2019-09-07 – 2019-09-10 (×15): 5 mg via ORAL
  Filled 2019-09-06 (×16): qty 1

## 2019-09-06 MED ORDER — TRAMADOL HCL 50 MG PO TABS
50.0000 mg | ORAL_TABLET | Freq: Two times a day (BID) | ORAL | Status: AC
  Administered 2019-09-06 – 2019-09-07 (×4): 50 mg via ORAL
  Filled 2019-09-06 (×4): qty 1

## 2019-09-06 MED ORDER — POLYETHYLENE GLYCOL 3350 17 G PO PACK
17.0000 g | PACK | Freq: Every day | ORAL | Status: DC
  Administered 2019-09-07 – 2019-09-12 (×4): 17 g via ORAL
  Filled 2019-09-06 (×7): qty 1

## 2019-09-06 NOTE — Progress Notes (Signed)
0138 Labetolol 5 mg IVP given for B/P 158/80.  0145 Oxy IR 10mg  po given for c/o pain to pelvic area, will cont to monitor.  0245 UTA pain at this time d/t pt being asleep.  0355 B/P recheck 143/83.  0432 Pt awoke crying for pain, Dr Kennon Holter was paged for additional orders for pain.  [id number] Tramadol 50mg  po given for c/o 7/10 pain, Labetolol 5mg  IVP given for B/P 159/80.

## 2019-09-06 NOTE — Progress Notes (Signed)
Physical Therapy Treatment Patient Details Name: Angelica Bell MRN: [id number] DOB: [birth year] Today's Date: 09/06/2019    History of Present Illness Pt adm after fall and found on floor at home. Pt with acute toxic/metabolic encephalopathy, UTI, rhabdo. Pt also found to have lt pubic and acetabular fx. PMH - RA, rt THR, chronic back pain, PE, scoliosis, osteoporosis, HTN, recent CVA    PT Comments    Pt with large improvement in mobility today. Was able to pivot from bed to chair and then chair to new bed that was brought in. Is requiring +2 max A for all mobility still at this point but is tolerating more mobility. 8/10 pain noted L thigh in sitting and standing. Pt tearful at times. PT will continue to follow.    Follow Up Recommendations  SNF     Equipment Recommendations  Other (comment)(TBD)    Recommendations for Other Services       Precautions / Restrictions Precautions Precautions: Fall Precaution Comments: also HOH Restrictions Weight Bearing Restrictions: No LLE Weight Bearing: Weight bearing as tolerated    Mobility  Bed Mobility Overal bed mobility: Needs Assistance Bed Mobility: Supine to Sit;Sit to Supine     Supine to sit: +2 for physical assistance;Max assist Sit to supine: +2 for physical assistance;Max assist   General bed mobility comments: pt able to grasp opposite rail and pull trunk for sup<>sit. Managed upper body with sit<>sup  Transfers Overall transfer level: Needs assistance Equipment used: None Transfers: Sit to/from [facility] Sit to Stand: Max assist;+2 safety/equipment Stand pivot transfers: Max assist;+2 safety/equipment       General transfer comment: pt stood from bed with therapist directly in front of her giving max A for power up, +2 for safety. Then stood again to transfer bed to chair and then chair to new bed that was brought in by nursing. Pt with increased LLE pain in standing and able to pivot feel but  not pick up either of them  Ambulation/Gait             General Gait Details: unable   Stairs             Wheelchair Mobility    Modified Rankin (Stroke Patients Only)       Balance Overall balance assessment: Needs assistance Sitting-balance support: Bilateral upper extremity supported;Feet unsupported Sitting balance-Leahy Scale: Fair Sitting balance - Comments: L thigh pain in sitting, min-guard EOB and in straight back chair with arms. Sometimes leans dangerously fwd to manage pain LLE Postural control: Right lateral lean Standing balance support: Bilateral upper extremity supported;During functional activity Standing balance-Leahy Scale: Zero                              Cognition Arousal/Alertness: Awake/alert Behavior During Therapy: Anxious Overall Cognitive Status: No family/caregiver present to determine baseline cognitive functioning Area of Impairment: Attention;Memory;Following commands;Problem solving;Safety/judgement                   Current Attention Level: Selective Memory: Decreased short-term memory Following Commands: Follows one step commands with increased time Safety/Judgement: Decreased awareness of deficits;Decreased awareness of safety   Problem Solving: Requires verbal cues;Requires tactile cues General Comments: pt compliant with therapy today despite pain, needs instructions several times as well as redirecting to task      Exercises General Exercises - Lower Extremity Ankle Circles/Pumps: AROM;Both;10 reps;Supine    General Comments General comments (skin integrity,  edema, etc.): pt encouraged to be able to mobilize more today      Pertinent Vitals/Pain Pain Assessment: Faces Faces Pain Scale: Hurts whole lot Pain Location: L thigh, pelvis Pain Descriptors / Indicators: Grimacing;Guarding;Crying Pain Intervention(s): Limited activity within patient's tolerance;Monitored during session;Premedicated  before session    Home Living                      Prior Function            PT Goals (current goals can now be found in the care plan section) Acute Rehab PT Goals Patient Stated Goal: be able to walk PT Goal Formulation: With patient Time For Goal Achievement: 09/16/19 Potential to Achieve Goals: Fair Progress towards PT goals: Progressing toward goals    Frequency    Min 3X/week      PT Plan Current plan remains appropriate    Co-evaluation              AM-PAC PT "6 Clicks" Mobility   Outcome Measure  Help needed turning from your back to your side while in a flat bed without using bedrails?: Total Help needed moving from lying on your back to sitting on the side of a flat bed without using bedrails?: Total Help needed moving to and from a bed to a chair (including a wheelchair)?: Total Help needed standing up from a chair using your arms (e.g., wheelchair or bedside chair)?: Total Help needed to walk in hospital room?: Total Help needed climbing 3-5 steps with a railing? : Total 6 Click Score: 6    End of Session   Activity Tolerance: Patient limited by pain Patient left: in bed;with call bell/phone within reach;with bed alarm set Nurse Communication: Mobility status PT Visit Diagnosis: Other abnormalities of gait and mobility (R26.89);Repeated falls (R29.6);Pain Pain - Right/Left: Left Pain - part of body: Hip     Time: [id number] PT Time Calculation (min) (ACUTE ONLY): 28 min  Charges:  $Therapeutic Activity: 23-37 mins                     Leighton Roach, PT  Acute Rehab Services  Pager [phone] Office [place] 09/06/2019, 10:53 AM

## 2019-09-06 NOTE — Progress Notes (Addendum)
PROGRESS NOTE  Angelica Bell [id number] DOB: 1932-05-23 DOA: 08/31/2019 PCP: Heywood Bene, PA-C  HPI/Recap of past 28 hours: 85 year old female with past medical history of prior CVA, hypertension, arthritis, chronic pain most recently discharged 8/8 following acute CVA, dehydration, rhabdomyolysis acute renal failure who was found down at home by the grand son. Patinet was back home from SNF last thursday , was found on floor on Friday morning by grandson. He went to check onTuesday morning (9/1) and found her on floor and he called EMS and she was brought to ER. Apparently fell in bathroom, took over an hour to get her bearings and dragged herself out toward phone.Patient responded very well to narcan and narcan drip. She did not respond to fluid resuscitation in terms of Bps so PCCM asked to admit for hypotension. She also had  rhabdo,aki, lactic acidosis, borderline UA, leucocytosis CT head no acute finding. Utox with benzos and opiates. Not sure where benzodiazepines came from but note history of taking too many xanax back in 2012. Per PCCM overall impression here is she is over-medicated at home, fell, and had rhabdo/AKI from prolonged downtime and poor PO. Also has fecal impaction, deconditioning, elevated ammonia and AST/ALT pattern that could be indicative of EtOH use although she denies. Shock seems more hypovolemic given response to fluids. Questionable UTI but I guess will treat with AMS, leukocytosis, and UA, d/c abx if Pct <0.5.Patient's blood pressure improved and she was transferred to [facility] service 9/3. Patient hydrated, AKI, rhabdomyolysis improved Continue to complain of left leg pain CT scan obtained that showed nondisplaced fracture of the left pubic bone.  09/06/19: Patient was seen and examined at bedside this morning.  She is alert, confused but redirectable.  She asked to be taken out of bed.  PT OT ordered for evaluation.    Assessment/Plan: Active  Problems:   ANXIETY DEPRESSION   Chronic pain   Essential hypertension   Abnormal liver function   Shock ([place])   UTI (urinary tract infection)   Rhabdomyolysis  Acute metabolic encephalopathy/delirium likely multifactorial secondary to UTI, metabolic abnormalities and opiates Confused but redirectable Continue to monitor and redirect as needed  Rhabdomyolysis setting of fall and prolonged time laying on the floor  BP trending up Continue IV fluid hydration Continue daily CPK and BMP Continue to monitor urine output CPK is trending down 2775 from 5310 yesterday Continue IV fluid hydration and continue to trend CPK Closely monitor renal function and urine output  Left pubic bone fracture post fall Optimize pain control Started tramadol 50 mg twice daily x2 days Started oxycodone 5 mg every 4 hours as needed for severe and breakthrough pain Started bowel regimen Senokot 2 tablet twice daily and MiraLAX daily to avoid opiate-induced constipation Closely monitor for side effects  Right breast injury in the setting of bilateral breast implants Reviewed CT chest and chest x-ray unrevealing General surgery consulted to further assess Optimize pain control  Uncontrolled hypertension Not on blood pressure medications at home Blood pressure not at goal Increase Norvasc to 10 mg daily, started here Continue to closely monitor vital signs.  Pseudomonal UTI Continue cefepime Afebrile with no leukocytosis day number 6 out of 7  Hypokalemia, persistent Potassium 3.1>> 3.4>> 2.9 Repleted with KCl p.o. 40 mEq twice daily Added 2 g IV magnesium  Pseudo-hypocalcemia Calcium 7.7, albumin 2.3 Corrected calcium for albumin 9.1  AKI on CKD 3 Currently appears to be at her baseline creatinine 1.24 with GFR 40 Renal function  essentially the same today Continue to avoid nephrotoxins Presented with creatinine of 1.8 with GFR of 25 Continue IV fluid hydration in the setting of  rhabdomyolysis Continue to monitor urine output BMP in a.m. Add magnesium level  Elevated LFTs, improving Elevated ALT and AST, levels continue to trend down Acute hepatitis panel negative  Chronic anxiety/depression Continue escitalopram  GERD Continue PPI  Hyperlipidemia Continue statin  Ambulatory dysfunction/physical debility PT OT assessment recommended SNF CSW assisting with SNF placement Continue fall precautions  DVT prophylaxis:Heparin subcu 3 times daily Code Status: Full code Family Communication:  None at bedside.  Will call grandson.   Disposition Plan:  Discharge is not appropriate at this time due to acute metabolic encephalopathy and persistent rhabdomyolysis.  Patient will require continuous IV fluid hydration to avoid kidney injury and worsening renal function.  Plan to discharge to SNF when hemodynamically stable.  Consultants:  CCM  Procedures: CT brain 9/1:Evolutionary findings in the small white matter infarcts in the left occipital lobe right parietal lobe, currently hypodense indicating increased chronicity. Both of these were subacute at the time of prior brain MRI of 07/27/2019. 2. Old right cerebellar infarct. 3. Periventricular white matter and corona radiata hypodensities favor chronic ischemic microvascular white matter disease. 4. No acute findings. CT chest abd/pelvis 9/1:Large sliding-type hiatal hernia.1.6 cm left thyroid nodule is noted. Thyroid ultrasound is recommended for further evaluation. Large amount of stool is seen in sigmoid colon and rectum concerning for possible impaction.Bibasilar bronchiectatic changes near the area of hiatal hernia  CT left hip9/2 IMPRESSION: 1. Nondisplaced fracture of the left parasymphyseal pubic bone. 2. Age-indeterminate nondisplaced fracture involving the anterior column of the left acetabulum. The appearance favors a subacute fracture. 3. Stable orthopedic hardware involving the left lower  extremity. 4. Nonspecific soft tissue swelling about the left lower extremity.  Microbiology:  UC 9/1>>> Pseudomonas BCX2 9/1>>>NGTD   Objective: Vitals:   09/06/19 0138 09/06/19 0335 09/06/19 0555 09/06/19 1245  BP: (!) 158/80 (!) 143/83 (!) 159/80 (!) 149/68  Pulse:   80 92  Resp:   16 20  Temp:   98.2 F (36.8 C) 98.5 F (36.9 C)  TempSrc:      SpO2:   97% 97%  Weight:      Height:        Intake/Output Summary (Last 24 hours) at 09/06/2019 1353 Last data filed at 09/06/2019 [id number] Gross per 24 hour  Intake 236 ml  Output 2100 ml  Net -1864 ml   Filed Weights   09/02/19 0500 09/03/19 0500 09/04/19 0500  Weight: 53.4 kg 63.7 kg 60.3 kg    Exam:  . General: 85 y.o. year-old female frail-appearing no acute distress.  Confused but redirectable.   . Cardiovascular: Regular rate and rhythm no rubs or gallops.  No JVD or thyromegaly noted.  Bruising noted right breast. . Respiratory: Clear to auscultation no wheezes or rales. Poor inspiratory effort. . Abdomen: Soft nontender nondistended bowel sounds present.  Musculoskeletal: Trace lower extremity edema right lower extremity.  1+ pitting edema in left lower extremity.   Marland Kitchen Psychiatry: Mood is appropriate for condition and setting.   Data Reviewed: CBC: Recent Labs  Lab 08/31/19 0841 09/01/19 0236 09/02/19 0241 09/03/19 0237 09/04/19 0314  WBC 29.8* 24.5* 14.1* 13.2* 17.3*  NEUTROABS 25.9*  --   --   --   --   HGB 10.2* 11.2* 9.7* 10.6* 11.3*  HCT 34.7* 36.7 30.8* 32.4* 34.3*  MCV 98.0 93.1 90.9 88.0 84.7  PLT  236 242 170 202 [id number]   Basic Metabolic Panel: Recent Labs  Lab 08/31/19 0741  09/01/19 0236 09/02/19 0241 09/03/19 0237 09/04/19 0314 09/05/19 0410 09/06/19 0615  NA 140   < > 138 139 140 139 141 140  K 5.2*   < > 4.7 4.1 3.6 3.1* 3.4* 2.9*  CL 103   < > 106 110 112* 108 109 105  CO2 19*  --  21* 21* 23 20* 20* 25  GLUCOSE 108*   < > 112* 94 103* 93 118* 100*  BUN 27*   < > 34* 31* 27* 20 19 17    CREATININE 2.42*   < > 1.82* 1.66* 1.52* 1.35* 1.24* 1.23*  CALCIUM 8.9  --  7.6* 7.7* 8.2* 8.1* 8.2* 7.7*  MG 2.3  --  1.7  --   --   --   --   --   PHOS 10.3*  --  4.7*  --   --   --   --   --    < > = values in this interval not displayed.   GFR: Estimated Creatinine Clearance: 31.3 mL/min (A) (by C-G formula based on SCr of 1.23 mg/dL (H)). Liver Function Tests: Recent Labs  Lab 08/31/19 0741 09/01/19 0236 09/02/19 0241 09/03/19 0237 09/04/19 0314  AST 118* 666* 354* 283* 250*  ALT 64* 502* 362* 320* 268*  ALKPHOS 88 90 67 68 81  BILITOT 0.5 0.4 0.4 0.6 1.0  PROT 6.4* 4.3* 3.8* 4.2* 4.5*  ALBUMIN 3.7 2.3* 2.0* 2.1* 2.3*   No results for input(s): LIPASE, AMYLASE in the last 168 hours. Recent Labs  Lab 08/31/19 0741 09/03/19 0237  AMMONIA 64* 15   Coagulation Profile: Recent Labs  Lab 08/31/19 0741  INR 1.2   Cardiac Enzymes: Recent Labs  Lab 09/02/19 0241 09/03/19 0237 09/04/19 0314 09/05/19 0410 09/06/19 0615  CKTOTAL 8,700* 4,115* 5,390* 5,310* 2,775*   BNP (last 3 results) No results for input(s): PROBNP in the last 8760 hours. HbA1C: No results for input(s): HGBA1C in the last 72 hours. CBG: Recent Labs  Lab 09/03/19 0517 09/03/19 0825 09/04/19 0118 09/04/19 0533 09/05/19 1101  GLUCAP 97 94 103* 89 131*   Lipid Profile: No results for input(s): CHOL, HDL, LDLCALC, TRIG, CHOLHDL, LDLDIRECT in the last 72 hours. Thyroid Function Tests: No results for input(s): TSH, T4TOTAL, FREET4, T3FREE, THYROIDAB in the last 72 hours. Anemia Panel: No results for input(s): VITAMINB12, FOLATE, FERRITIN, TIBC, IRON, RETICCTPCT in the last 72 hours. Urine analysis:    Component Value Date/Time   COLORURINE YELLOW 08/31/2019 0745   APPEARANCEUR HAZY (A) 08/31/2019 0745   LABSPEC 1.013 08/31/2019 0745   PHURINE 5.0 08/31/2019 0745   GLUCOSEU NEGATIVE 08/31/2019 0745   HGBUR LARGE (A) 08/31/2019 0745   HGBUR negative 04/24/2009 1635   BILIRUBINUR  NEGATIVE 08/31/2019 0745   BILIRUBINUR negative 05/29/2011 1439   KETONESUR NEGATIVE 08/31/2019 0745   PROTEINUR 30 (A) 08/31/2019 0745   UROBILINOGEN 0.2 01/26/2014 1912   NITRITE NEGATIVE 08/31/2019 0745   LEUKOCYTESUR SMALL (A) 08/31/2019 0745   Sepsis Labs: @LABRCNTIP (procalcitonin:4,lacticidven:4)  ) Recent Results (from the past 240 hour(s))  Blood Culture (routine x 2)     Status: None   Collection Time: 08/31/19  7:30 AM   Specimen: BLOOD  Result Value Ref Range Status   Specimen Description BLOOD LEFT ANTECUBITAL  Final   Special Requests   Final    BOTTLES DRAWN AEROBIC AND ANAEROBIC Blood Culture adequate volume  Culture   Final    NO GROWTH 5 DAYS Performed at [facility], [place] [address] [place], [place] [address]    Report Status 09/05/2019 FINAL  Final  Blood Culture (routine x 2)     Status: None   Collection Time: 08/31/19  7:39 AM   Specimen: BLOOD RIGHT FOREARM  Result Value Ref Range Status   Specimen Description BLOOD RIGHT FOREARM  Final   Special Requests   Final    BOTTLES DRAWN AEROBIC AND ANAEROBIC Blood Culture results may not be optimal due to an inadequate volume of blood received in culture bottles   Culture   Final    NO GROWTH 5 DAYS Performed at [facility], [place] [address], [place], [place] [address]    Report Status 09/05/2019 FINAL  Final  SARS Coronavirus 2 [facility] order, Performed in [facility] hospital lab) Nasopharyngeal Nasopharyngeal Swab     Status: None   Collection Time: 08/31/19  8:00 AM   Specimen: Nasopharyngeal Swab  Result Value Ref Range Status   SARS Coronavirus 2 NEGATIVE NEGATIVE Final    Comment: (NOTE) If result is NEGATIVE SARS-CoV-2 target nucleic acids are NOT DETECTED. The SARS-CoV-2 RNA is generally detectable in upper and lower  respiratory specimens during the acute phase of infection. The lowest  concentration of SARS-CoV-2 viral copies this assay can detect is 250  copies / mL. A negative  result does not preclude SARS-CoV-2 infection  and should not be used as the sole basis for treatment or other  patient management decisions.  A negative result may occur with  improper specimen collection / handling, submission of specimen other  than nasopharyngeal swab, presence of viral mutation(s) within the  areas targeted by this assay, and inadequate number of viral copies  (<250 copies / mL). A negative result must be combined with clinical  observations, patient history, and epidemiological information. If result is POSITIVE SARS-CoV-2 target nucleic acids are DETECTED. The SARS-CoV-2 RNA is generally detectable in upper and lower  respiratory specimens dur ing the acute phase of infection.  Positive  results are indicative of active infection with SARS-CoV-2.  Clinical  correlation with patient history and other diagnostic information is  necessary to determine patient infection status.  Positive results do  not rule out bacterial infection or co-infection with other viruses. If result is PRESUMPTIVE POSTIVE SARS-CoV-2 nucleic acids MAY BE PRESENT.   A presumptive positive result was obtained on the submitted specimen  and confirmed on repeat testing.  While 2019 novel coronavirus  (SARS-CoV-2) nucleic acids may be present in the submitted sample  additional confirmatory testing may be necessary for epidemiological  and / or clinical management purposes  to differentiate between  SARS-CoV-2 and other Sarbecovirus currently known to infect humans.  If clinically indicated additional testing with an alternate test  methodology [phone]) is advised. The SARS-CoV-2 RNA is generally  detectable in upper and lower respiratory sp ecimens during the acute  phase of infection. The expected result is Negative. Fact Sheet for Patients:  StrictlyIdeas.no Fact Sheet for Healthcare Providers: BankingDealers.co.za This test is not yet  approved or cleared by the [place] FDA and has been authorized for detection and/or diagnosis of SARS-CoV-2 by FDA under an Emergency Use Authorization (EUA).  This EUA will remain in effect (meaning this test can be used) for the duration of the COVID-19 declaration under Section 564(b)(1) of the Act, 21 U.S.C. section 360bbb-3(b)(1), unless the authorization is terminated or  revoked sooner. Performed at [facility], [place] [address] [place], [place] [address]   Urine culture     Status: Abnormal   Collection Time: 08/31/19  9:20 AM   Specimen: In/Out Cath Urine  Result Value Ref Range Status   Specimen Description IN/OUT CATH URINE  Final   Special Requests   Final    NONE Performed at [facility], [place] [address], [place], [place] [address]    Culture >=100,000 COLONIES/mL PSEUDOMONAS AERUGINOSA (A)  Final   Report Status 09/02/2019 FINAL  Final   Organism ID, Bacteria PSEUDOMONAS AERUGINOSA (A)  Final      Susceptibility   Pseudomonas aeruginosa - MIC*    CEFTAZIDIME 2 SENSITIVE Sensitive     CIPROFLOXACIN >=4 RESISTANT Resistant     GENTAMICIN 4 SENSITIVE Sensitive     IMIPENEM <=0.25 SENSITIVE Sensitive     PIP/TAZO <=4 SENSITIVE Sensitive     CEFEPIME 2 SENSITIVE Sensitive     * >=100,000 COLONIES/mL PSEUDOMONAS AERUGINOSA  MRSA PCR Screening     Status: None   Collection Time: 08/31/19  2:28 PM   Specimen: Nasal Mucosa; Nasopharyngeal  Result Value Ref Range Status   MRSA by PCR NEGATIVE NEGATIVE Final    Comment:        The GeneXpert MRSA Assay (FDA approved for NASAL specimens only), is one component of a comprehensive MRSA colonization surveillance program. It is not intended to diagnose MRSA infection nor to guide or monitor treatment for MRSA infections. Performed at [facility], [place] [address], [place], [place] [address]       Studies: No results found.  Scheduled Meds: . amLODipine  10 mg Oral Daily  . aspirin EC  81 mg  Oral Daily  . bisacodyl  10 mg Rectal Once  . Chlorhexidine Gluconate Cloth  6 each Topical Daily  . escitalopram  20 mg Oral Daily  . heparin  5,000 Units Subcutaneous Q8H  . pantoprazole  40 mg Oral Daily  . polyethylene glycol  17 g Oral Daily  . potassium chloride  40 mEq Oral BID  . senna-docusate  2 tablet Oral BID  . traMADol  50 mg Oral Q12H    Continuous Infusions: . sodium chloride 75 mL/hr at 09/05/19 2238  . ceFEPime (MAXIPIME) IV 2 g (09/06/19 1003)  . magnesium sulfate bolus IVPB       LOS: 6 days     Kayleen Memos, MD [facility] Pager [phone]  If 7PM-7AM, please contact night-coverage [email or web address] Password [email or web address] 09/06/2019, 1:53 PM

## 2019-09-06 NOTE — TOC Initial Note (Signed)
Transition of Care [facility]) - Initial/Assessment Note    Patient Details  Name: Angelica Bell MRN: [id number] Date of Birth: 1932-03-03  Transition of Care [facility]) CM/SW Contact:    Gelene Mink, [place] Phone Number: 09/06/2019, 11:35 AM  Clinical Narrative:                  CSW called and spoke with the patient's grandson and POA, Ben Case. CSW introduced herself and explained her role. CSW shared the therapy recommendation. [name] stated that he has spoke with the doctor and is agreeable to her going back to rehab if that is what she needs to do. CSW shared that the patient would be starting in her copay days. CSW shared that the patient would be responsible for $170.00 per day. He stated that finances might be an issue. He stated that she receives $2100.00 per month from social security and she has a Careers information officer. CSW shared that she understood that but at this point the patient cannot return home alone without 24/7 supervision. He stated that he checks on his grandmother daily but he cannot stay with her 24/7, he stated that he works and has his own family. He asked about assisted living. He stated that someone spoke with him about that the last time she was in the hospital.   CSW asked if he was interested in pursing assisted living or long term care for the patient. He stated that he would need to speak with the family. CSW stated that she could reach out to financial counseling on Tuesday to start a Medicaid application for the patient. CSW stated that if the patient qualified for medicaid it could assist with paying for long term care.   CSW will plan on following up with [name] on Tuesday for disposition decision.   Expected Discharge Plan: Skilled Nursing Facility Barriers to Discharge: Insurance Authorization, Continued Medical Work up   Patient Goals and CMS Choice Patient states their goals for this hospitalization and ongoing recovery are:: Pt grandson is agreeable to SNF CMS  Medicare.gov Compare Post Acute Care list provided to:: Patient Represenative (must comment) Choice offered to / list presented to : [facility] POA / Guardian(Pt grandson)  Expected Discharge Plan and Services Expected Discharge Plan: [place] In-house Referral: Clinical Social Work Discharge Planning Services: NA Post Acute Care Choice: [place] Living arrangements for the past 2 months: [place], [place]                 DME Arranged: N/A DME Agency: NA       HH Arranged: NA [place] Agency: NA        Prior Living Arrangements/Services Living arrangements for the past 2 months: [place], [place] Lives with:: Self Patient language and need for interpreter reviewed:: No Do you feel safe going back to the place where you live?: No   Pt cannot stay by herself  Need for Family Participation in Patient Care: Yes (Comment) Care giver support system in place?: Yes (comment)   Criminal Activity/Legal Involvement Pertinent to Current Situation/Hospitalization: No - Comment as needed  Activities of Daily Living      Permission Sought/Granted Permission sought to share information with : Case Manager Permission granted to share information with : Yes, Verbal Permission Granted  Share Information with NAME: [name]  Permission granted to share info w AGENCY: All SNF  Permission granted to share info w Relationship: Grandson/ POA  Permission granted to share info w Contact Information: [phone]  Emotional Assessment Appearance:: Appears stated age Attitude/Demeanor/Rapport: Unable to Assess Affect (typically observed): Unable to Assess Orientation: : Oriented to Self Alcohol / Substance Use: Not Applicable Psych Involvement: No (comment)  Admission diagnosis:  ams Patient Active Problem List   Diagnosis Date Noted  . UTI (urinary tract infection) 09/01/2019  . Rhabdomyolysis 09/01/2019  . Shock ([place])  08/31/2019  . Acute cystitis 08/03/2019  . Confusion and disorientation 08/03/2019  . Cerebrovascular accident (CVA) ([place])   . ARF (acute renal failure) ([place]) 07/26/2019  . Hyponatremia 07/26/2019  . Abnormal liver function 07/26/2019  . Peripheral neuropathy   . Drug overdose   . Polypharmacy   . Altered mental status 10/25/2017  . Closed displaced intertrochanteric fracture of left femur ([place]) 02/16/2017  . Hip fracture, right ([place]) 12/25/2015  . Chronic pain 12/24/2015  . Fracture of femoral neck, right, closed ([place]) 12/24/2015  . Essential hypertension 12/24/2015  . Community acquired pneumonia 04/15/2013  . Hypokalemia 04/15/2013  . Labial cyst 02/08/2012  . POSTMENOPAUSAL SYNDROME 11/28/2009  . PERIPHERAL NEUROPATHY, LOWER EXTREMITY, RIGHT 02/22/2009  . INSOMNIA-SLEEP DISORDER-UNSPEC 10/12/2008  . Lumbago 08/02/2008  . 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 ANXIETY DEPRESSION 12/16/2007  . HYPERLIPIDEMIA 07/08/2007  . Osteoarthritis 07/08/2007  . Osteoporosis 07/08/2007  . SCOLIOSIS NEC 07/08/2007   PCP:  Heywood Bene, PA-C Pharmacy:   [place], [place] - [address] [place] [address] Phone: [phone] Fax: [phone]  [facility] [place], [place] - [address] [place] [address] [place] [address] [place] [address] Phone: [phone] Fax: [phone]     Social Determinants of Health (SDOH) Interventions    Readmission Risk Interventions No flowsheet data found.

## 2019-09-07 LAB — BASIC METABOLIC PANEL WITH GFR
Anion gap: 7 (ref 5–15)
BUN: 17 mg/dL (ref 8–23)
CO2: 23 mmol/L (ref 22–32)
Calcium: 8.1 mg/dL — ABNORMAL LOW (ref 8.9–10.3)
Chloride: 109 mmol/L (ref 98–111)
Creatinine, Ser: 1.14 mg/dL — ABNORMAL HIGH (ref 0.44–1.00)
GFR calc Af Amer: 51 mL/min — ABNORMAL LOW
GFR calc non Af Amer: 44 mL/min — ABNORMAL LOW
Glucose, Bld: 94 mg/dL (ref 70–99)
Potassium: 3.4 mmol/L — ABNORMAL LOW (ref 3.5–5.1)
Sodium: 139 mmol/L (ref 135–145)

## 2019-09-07 LAB — CK: Total CK: 1648 U/L — ABNORMAL HIGH (ref 38–234)

## 2019-09-07 MED ORDER — POTASSIUM CHLORIDE 20 MEQ/15ML (10%) PO SOLN
40.0000 meq | Freq: Once | ORAL | Status: AC
  Administered 2019-09-07: 40 meq via ORAL
  Filled 2019-09-07: qty 30

## 2019-09-07 MED ORDER — POTASSIUM CHLORIDE CRYS ER 20 MEQ PO TBCR: 40.0000 meq | EXTENDED_RELEASE_TABLET | Freq: Once | ORAL | Status: DC

## 2019-09-07 NOTE — Progress Notes (Signed)
PROGRESS NOTE  Angelica SARAH [id number] DOB: 06-23-1932 DOA: 08/31/2019 PCP: Heywood Bene, PA-C  HPI/Recap of past 64 hours: 85 year old female with past medical history of prior CVA, hypertension, arthritis, chronic pain most recently discharged 8/8 following acute CVA, dehydration, rhabdomyolysis acute renal failure who was found down at home by the grand son. Patinet was back home from SNF last thursday , was found on floor on Friday morning by grandson. He went to check onTuesday morning (9/1) and found her on floor and he called EMS and she was brought to ER. Apparently fell in bathroom, took over an hour to get her bearings and dragged herself out toward phone.Patient responded very well to narcan and narcan drip. She did not respond to fluid resuscitation in terms of Bps so PCCM asked to admit for hypotension. She also had  rhabdo,aki, lactic acidosis, borderline UA, leucocytosis CT head no acute finding. Utox with benzos and opiates. Not sure where benzodiazepines came from but note history of taking too many xanax back in 2012. Per PCCM overall impression here is she is over-medicated at home, fell, and had rhabdo/AKI from prolonged downtime and poor PO. Also has fecal impaction, deconditioning, elevated ammonia and AST/ALT pattern that could be indicative of EtOH use although she denies. Shock seems more hypovolemic given response to fluids. Questionable UTI but I guess will treat with AMS, leukocytosis, and UA, d/c abx if Pct <0.5.Patient's blood pressure improved and she was transferred to [facility] service 9/3. Patient hydrated, AKI, rhabdomyolysis improved Continue to complain of left leg pain CT scan obtained that showed nondisplaced fracture of the left pubic bone.  09/07/19: Patient was seen and examined at bedside this morning.  No acute events overnight.  Reports pain in her right breast.  Curbsided/discussed with general surgery Dr. Georgiana Spinner who reviewed her chest  imagings, no clear evidence of breast implants.  No signs of fracture.  We will continue to monitor and treat conservatively.     Assessment/Plan: Active Problems:   ANXIETY DEPRESSION   Chronic pain   Essential hypertension   Abnormal liver function   Shock ([place])   UTI (urinary tract infection)   Rhabdomyolysis  Acute metabolic encephalopathy/delirium likely multifactorial secondary to UTI, metabolic abnormalities and opiates Confused but redirectable Continue to monitor and redirect as needed  Improving rhabdomyolysis in the setting of fall and prolonged time laying on the floor  CPK is trending down 1648 on 09/07/2019 from 2775 yesterday Continue IV fluid hydration Continue to trend CPK Renal function improving cr 1.14 from 1.23 Continue to closely monitor urine output and renal function Daily BMP Daily CPK Optimize pain control  Left pubic bone fracture post fall Optimize pain control Continue tramadol 50 mg twice daily x2 days Continue oxycodone 5 mg every 4 hours as needed for severe and breakthrough pain Continue bowel regimen Senokot 2 tablet twice daily and MiraLAX daily to avoid opiate-induced constipation Closely monitor for side effects PT OT recommended SNF CSW consulted to assist with SNF placement Continue fall precautions  Right breast injury with questionable breast implants Patient reports she has had breast implants No clear evidence of breast implant on chest imagings Curb sided with general surgery Dr. Georgiana Spinner who also reviewed chest imagings Continue conservative management Pain control  Uncontrolled hypertension Not on blood pressure medications at home Fluctuates and worsened by pain Control pain Continue Norvasc to 10 mg daily, started here Continue to monitor vital signs On IV labetalol as needed  Pseudomonal UTI Urine culture  from 08/31/19 positive for pseudomonas aeruginosa resistant to ciprofloxacin Completed 7 days of IV cefepime  Blood cultures x2 drawn on 08/31/2019 final negative.  Hypokalemia, persistent Potassium 3.4 Repleted with p.o. KCl 40 mEq once Added 2 g IV magnesium Repeat BMP and magnesium level in the morning  Pseudo-hypocalcemia Calcium 7.7, albumin 2.3 Corrected calcium for albumin 9.1  Improving AKI on CKD 3 Appears to be at her baseline creatinine 1.1 with GFR 44  Continue to avoid nephrotoxins Presented with creatinine of 1.8 with GFR of 25 Continue IV fluid hydration in the setting of rhabdomyolysis Continue to monitor urine output  Elevated LFTs, improving Elevated ALT and AST, levels continue to trend down Acute hepatitis panel negative  Chronic anxiety/depression Continue escitalopram  GERD Continue PPI  Hyperlipidemia Continue statin  Ambulatory dysfunction/physical debility PT OT assessment recommended SNF CSW assisting with SNF placement Continue fall precautions  DVT prophylaxis:Heparin subcu 3 times daily Code Status: Full code Family Communication:  None at bedside.  Will call grandson.   Disposition Plan:  Discharge is not appropriate at this time due to persistent rhabdomyolysis requiring IV fluids to avoid complication such as worsening renal function.  Possible discharge in 1 to 2 days to SNF when bed is available.  Consultants:  CCM  Procedures: CT brain 9/1:Evolutionary findings in the small white matter infarcts in the left occipital lobe right parietal lobe, currently hypodense indicating increased chronicity. Both of these were subacute at the time of prior brain MRI of 07/27/2019. 2. Old right cerebellar infarct. 3. Periventricular white matter and corona radiata hypodensities favor chronic ischemic microvascular white matter disease. 4. No acute findings. CT chest abd/pelvis 9/1:Large sliding-type hiatal hernia.1.6 cm left thyroid nodule is noted. Thyroid ultrasound is recommended for further evaluation. Large amount of stool is seen in sigmoid  colon and rectum concerning for possible impaction.Bibasilar bronchiectatic changes near the area of hiatal hernia  CT left hip9/2 IMPRESSION: 1. Nondisplaced fracture of the left parasymphyseal pubic bone. 2. Age-indeterminate nondisplaced fracture involving the anterior column of the left acetabulum. The appearance favors a subacute fracture. 3. Stable orthopedic hardware involving the left lower extremity. 4. Nonspecific soft tissue swelling about the left lower extremity.  Microbiology:  UC 9/1>>> Pseudomonas BCX2 9/1>>>NGTD   Objective: Vitals:   09/06/19 0555 09/06/19 1245 09/06/19 2128 09/07/19 0703  BP: (!) 159/80 (!) 149/68 131/72 (!) 184/95  Pulse: 80 92 79   Resp: 16 20 16  (!) 23  Temp: 98.2 F (36.8 C) 98.5 F (36.9 C) 97.7 F (36.5 C) 98 F (36.7 C)  TempSrc:    Oral  SpO2: 97% 97% 96% 97%  Weight:      Height:        Intake/Output Summary (Last 24 hours) at 09/07/2019 0933 Last data filed at 09/07/2019 [id number] Gross per 24 hour  Intake 1967.77 ml  Output 1350 ml  Net 617.77 ml   Filed Weights   09/02/19 0500 09/03/19 0500 09/04/19 0500  Weight: 53.4 kg 63.7 kg 60.3 kg    Exam:  . General: 85 y.o. year-old female frail-appearing no acute distress.  Alert and interactive.   . Cardiovascular: Regular rate and rhythm no rubs or gallops.  No JVD or thyromegaly noted.  Right breast bruising with tenderness on palpation.  Marland Kitchen Respiratory: Clear to auscultation no wheezes or rales.  Poor inspiratory effort.   . Abdomen: Soft nontender nondistended bowel sounds present.  . Musculoskeletal: Trace edema in lower extremities bilaterally . Psychiatry: Mood is appropriate for condition  and setting.   Data Reviewed: CBC: Recent Labs  Lab 09/01/19 0236 09/02/19 0241 09/03/19 0237 09/04/19 0314  WBC 24.5* 14.1* 13.2* 17.3*  HGB 11.2* 9.7* 10.6* 11.3*  HCT 36.7 30.8* 32.4* 34.3*  MCV 93.1 90.9 88.0 84.7  PLT 242 170 202 [id number]   Basic Metabolic Panel:  Recent Labs  Lab 09/01/19 0236  09/03/19 0237 09/04/19 0314 09/05/19 0410 09/06/19 0615 09/07/19 0239  NA 138   < > 140 139 141 140 139  K 4.7   < > 3.6 3.1* 3.4* 2.9* 3.4*  CL 106   < > 112* 108 109 105 109  CO2 21*   < > 23 20* 20* 25 23  GLUCOSE 112*   < > 103* 93 118* 100* 94  BUN 34*   < > 27* 20 19 17 17   CREATININE 1.82*   < > 1.52* 1.35* 1.24* 1.23* 1.14*  CALCIUM 7.6*   < > 8.2* 8.1* 8.2* 7.7* 8.1*  MG 1.7  --   --   --   --   --   --   PHOS 4.7*  --   --   --   --   --   --    < > = values in this interval not displayed.   GFR: Estimated Creatinine Clearance: 33.8 mL/min (A) (by C-G formula based on SCr of 1.14 mg/dL (H)). Liver Function Tests: Recent Labs  Lab 09/01/19 0236 09/02/19 0241 09/03/19 0237 09/04/19 0314  AST 666* 354* 283* 250*  ALT 502* 362* 320* 268*  ALKPHOS 90 67 68 81  BILITOT 0.4 0.4 0.6 1.0  PROT 4.3* 3.8* 4.2* 4.5*  ALBUMIN 2.3* 2.0* 2.1* 2.3*   No results for input(s): LIPASE, AMYLASE in the last 168 hours. Recent Labs  Lab 09/03/19 0237  AMMONIA 15   Coagulation Profile: No results for input(s): INR, PROTIME in the last 168 hours. Cardiac Enzymes: Recent Labs  Lab 09/03/19 0237 09/04/19 0314 09/05/19 0410 09/06/19 0615 09/07/19 0239  CKTOTAL 4,115* 5,390* 5,310* 2,775* 1,648*   BNP (last 3 results) No results for input(s): PROBNP in the last 8760 hours. HbA1C: No results for input(s): HGBA1C in the last 72 hours. CBG: Recent Labs  Lab 09/03/19 0517 09/03/19 0825 09/04/19 0118 09/04/19 0533 09/05/19 1101  GLUCAP 97 94 103* 89 131*   Lipid Profile: No results for input(s): CHOL, HDL, LDLCALC, TRIG, CHOLHDL, LDLDIRECT in the last 72 hours. Thyroid Function Tests: No results for input(s): TSH, T4TOTAL, FREET4, T3FREE, THYROIDAB in the last 72 hours. Anemia Panel: No results for input(s): VITAMINB12, FOLATE, FERRITIN, TIBC, IRON, RETICCTPCT in the last 72 hours. Urine analysis:    Component Value Date/Time    COLORURINE YELLOW 08/31/2019 0745   APPEARANCEUR HAZY (A) 08/31/2019 0745   LABSPEC 1.013 08/31/2019 0745   PHURINE 5.0 08/31/2019 0745   GLUCOSEU NEGATIVE 08/31/2019 0745   HGBUR LARGE (A) 08/31/2019 0745   HGBUR negative 04/24/2009 1635   BILIRUBINUR NEGATIVE 08/31/2019 0745   BILIRUBINUR negative 05/29/2011 1439   KETONESUR NEGATIVE 08/31/2019 0745   PROTEINUR 30 (A) 08/31/2019 0745   UROBILINOGEN 0.2 01/26/2014 1912   NITRITE NEGATIVE 08/31/2019 0745   LEUKOCYTESUR SMALL (A) 08/31/2019 0745   Sepsis Labs: @LABRCNTIP (procalcitonin:4,lacticidven:4)  ) Recent Results (from the past 240 hour(s))  Blood Culture (routine x 2)     Status: None   Collection Time: 08/31/19  7:30 AM   Specimen: BLOOD  Result Value Ref Range Status   Specimen Description BLOOD LEFT  ANTECUBITAL  Final   Special Requests   Final    BOTTLES DRAWN AEROBIC AND ANAEROBIC Blood Culture adequate volume   Culture   Final    NO GROWTH 5 DAYS Performed at [facility], [address], [place], [place] [address]    Report Status 09/05/2019 FINAL  Final  Blood Culture (routine x 2)     Status: None   Collection Time: 08/31/19  7:39 AM   Specimen: BLOOD RIGHT FOREARM  Result Value Ref Range Status   Specimen Description BLOOD RIGHT FOREARM  Final   Special Requests   Final    BOTTLES DRAWN AEROBIC AND ANAEROBIC Blood Culture results may not be optimal due to an inadequate volume of blood received in culture bottles   Culture   Final    NO GROWTH 5 DAYS Performed at [facility], [place] [address] [place], [place] [address]    Report Status 09/05/2019 FINAL  Final  SARS Coronavirus 2 [facility] order, Performed in [facility] hospital lab) Nasopharyngeal Nasopharyngeal Swab     Status: None   Collection Time: 08/31/19  8:00 AM   Specimen: Nasopharyngeal Swab  Result Value Ref Range Status   SARS Coronavirus 2 NEGATIVE NEGATIVE Final    Comment: (NOTE) If result is NEGATIVE SARS-CoV-2 target  nucleic acids are NOT DETECTED. The SARS-CoV-2 RNA is generally detectable in upper and lower  respiratory specimens during the acute phase of infection. The lowest  concentration of SARS-CoV-2 viral copies this assay can detect is 250  copies / mL. A negative result does not preclude SARS-CoV-2 infection  and should not be used as the sole basis for treatment or other  patient management decisions.  A negative result may occur with  improper specimen collection / handling, submission of specimen other  than nasopharyngeal swab, presence of viral mutation(s) within the  areas targeted by this assay, and inadequate number of viral copies  (<250 copies / mL). A negative result must be combined with clinical  observations, patient history, and epidemiological information. If result is POSITIVE SARS-CoV-2 target nucleic acids are DETECTED. The SARS-CoV-2 RNA is generally detectable in upper and lower  respiratory specimens dur ing the acute phase of infection.  Positive  results are indicative of active infection with SARS-CoV-2.  Clinical  correlation with patient history and other diagnostic information is  necessary to determine patient infection status.  Positive results do  not rule out bacterial infection or co-infection with other viruses. If result is PRESUMPTIVE POSTIVE SARS-CoV-2 nucleic acids MAY BE PRESENT.   A presumptive positive result was obtained on the submitted specimen  and confirmed on repeat testing.  While 2019 novel coronavirus  (SARS-CoV-2) nucleic acids may be present in the submitted sample  additional confirmatory testing may be necessary for epidemiological  and / or clinical management purposes  to differentiate between  SARS-CoV-2 and other Sarbecovirus currently known to infect humans.  If clinically indicated additional testing with an alternate test  methodology [phone]) is advised. The SARS-CoV-2 RNA is generally  detectable in upper and lower  respiratory sp ecimens during the acute  phase of infection. The expected result is Negative. Fact Sheet for Patients:  StrictlyIdeas.no Fact Sheet for Healthcare Providers: BankingDealers.co.za This test is not yet approved or cleared by the [place] FDA and has been authorized for detection and/or diagnosis of SARS-CoV-2 by FDA under an Emergency Use Authorization (EUA).  This EUA will remain in effect (meaning this test can be  used) for the duration of the COVID-19 declaration under Section 564(b)(1) of the Act, 21 U.S.C. section 360bbb-3(b)(1), unless the authorization is terminated or revoked sooner. Performed at [facility], [place] [address] [place], [place] [address]   Urine culture     Status: Abnormal   Collection Time: 08/31/19  9:20 AM   Specimen: In/Out Cath Urine  Result Value Ref Range Status   Specimen Description IN/OUT CATH URINE  Final   Special Requests   Final    NONE Performed at [facility], [place] [address], [place], [place] [address]    Culture >=100,000 COLONIES/mL PSEUDOMONAS AERUGINOSA (A)  Final   Report Status 09/02/2019 FINAL  Final   Organism ID, Bacteria PSEUDOMONAS AERUGINOSA (A)  Final      Susceptibility   Pseudomonas aeruginosa - MIC*    CEFTAZIDIME 2 SENSITIVE Sensitive     CIPROFLOXACIN >=4 RESISTANT Resistant     GENTAMICIN 4 SENSITIVE Sensitive     IMIPENEM <=0.25 SENSITIVE Sensitive     PIP/TAZO <=4 SENSITIVE Sensitive     CEFEPIME 2 SENSITIVE Sensitive     * >=100,000 COLONIES/mL PSEUDOMONAS AERUGINOSA  MRSA PCR Screening     Status: None   Collection Time: 08/31/19  2:28 PM   Specimen: Nasal Mucosa; Nasopharyngeal  Result Value Ref Range Status   MRSA by PCR NEGATIVE NEGATIVE Final    Comment:        The GeneXpert MRSA Assay (FDA approved for NASAL specimens only), is one component of a comprehensive MRSA colonization surveillance program. It is not intended to  diagnose MRSA infection nor to guide or monitor treatment for MRSA infections. Performed at [facility], [place] [address] [place], [place] [address]       Studies: No results found.  Scheduled Meds: . amLODipine  10 mg Oral Daily  . aspirin EC  81 mg Oral Daily  . bisacodyl  10 mg Rectal Once  . Chlorhexidine Gluconate Cloth  6 each Topical Daily  . escitalopram  20 mg Oral Daily  . heparin  5,000 Units Subcutaneous Q8H  . pantoprazole  40 mg Oral Daily  . polyethylene glycol  17 g Oral Daily  . senna-docusate  2 tablet Oral BID  . traMADol  50 mg Oral Q12H    Continuous Infusions: . sodium chloride 75 mL/hr at 09/05/19 2238  . ceFEPime (MAXIPIME) IV 2 g (09/06/19 1003)     LOS: 7 days     Kayleen Memos, MD [facility] Pager [phone]  If 7PM-7AM, please contact night-coverage [email or web address] Password [facility] 09/07/2019, 9:33 AM

## 2019-09-08 LAB — BASIC METABOLIC PANEL
Anion gap: 5 (ref 5–15)
BUN: 16 mg/dL (ref 8–23)
CO2: 23 mmol/L (ref 22–32)
Calcium: 7.9 mg/dL — ABNORMAL LOW (ref 8.9–10.3)
Chloride: 108 mmol/L (ref 98–111)
Creatinine, Ser: 1.01 mg/dL — ABNORMAL HIGH (ref 0.44–1.00)
GFR calc Af Amer: 59 mL/min — ABNORMAL LOW (ref >60)
GFR calc non Af Amer: 51 mL/min — ABNORMAL LOW (ref >60)
Glucose, Bld: 100 mg/dL — ABNORMAL HIGH (ref 70–99)
Potassium: 3.2 mmol/L — ABNORMAL LOW (ref 3.5–5.1)
Sodium: 136 mmol/L (ref 135–145)

## 2019-09-08 LAB — CK: Total CK: 647 U/L — ABNORMAL HIGH (ref 38–234)

## 2019-09-08 LAB — MAGNESIUM: Magnesium: 1.6 mg/dL — ABNORMAL LOW (ref 1.7–2.4)

## 2019-09-08 MED ORDER — TRAMADOL HCL 50 MG PO TABS
50.0000 mg | ORAL_TABLET | Freq: Two times a day (BID) | ORAL | Status: AC
  Administered 2019-09-08 – 2019-09-09 (×4): 50 mg via ORAL
  Filled 2019-09-08 (×4): qty 1

## 2019-09-08 MED ORDER — PANTOPRAZOLE SODIUM 40 MG PO TBEC: 40.0000 mg | DELAYED_RELEASE_TABLET | Freq: Every day | ORAL | 0 refills | Status: DC

## 2019-09-08 MED ORDER — POLYETHYLENE GLYCOL 3350 17 G PO PACK: 17.0000 g | PACK | Freq: Every day | ORAL | 0 refills | Status: DC

## 2019-09-08 MED ORDER — ESCITALOPRAM OXALATE 20 MG PO TABS: 20.0000 mg | ORAL_TABLET | Freq: Every day | ORAL | 0 refills | Status: DC

## 2019-09-08 MED ORDER — AMLODIPINE BESYLATE 10 MG PO TABS: 10.0000 mg | ORAL_TABLET | Freq: Every day | ORAL | 0 refills | Status: DC

## 2019-09-08 MED ORDER — POTASSIUM CHLORIDE 20 MEQ/15ML (10%) PO SOLN
40.0000 meq | Freq: Two times a day (BID) | ORAL | Status: AC
  Filled 2019-09-08 (×3): qty 30

## 2019-09-08 MED ORDER — OXYCODONE HCL 5 MG PO TABS: 5.0000 mg | ORAL_TABLET | Freq: Two times a day (BID) | ORAL | 0 refills | Status: DC | PRN

## 2019-09-08 MED ORDER — POTASSIUM CHLORIDE ER 10 MEQ PO TBCR: 10.0000 meq | EXTENDED_RELEASE_TABLET | Freq: Every day | ORAL | 0 refills | Status: DC

## 2019-09-08 MED ORDER — MAGNESIUM SULFATE 4 GM/100ML IV SOLN
4.0000 g | Freq: Once | INTRAVENOUS | Status: AC
  Administered 2019-09-08: 07:00:00 4 g via INTRAVENOUS
  Filled 2019-09-08: qty 100

## 2019-09-08 NOTE — Progress Notes (Signed)
Physical Therapy Treatment Patient Details Name: Angelica Bell MRN: [id number] DOB: Jan 07, 1932 Today's Date: 09/08/2019    History of Present Illness Pt adm after fall and found on floor at home. Pt with acute toxic/metabolic encephalopathy, UTI, rhabdo. Pt also found to have lt pubic and acetabular fx. PMH - RA, rt THR, chronic back pain, PE, scoliosis, osteoporosis, HTN, recent CVA    PT Comments    Interestingly - I was the therapist assigned to see pt through [facility] over the weekend.  Pt refused my PT eval visit. She called me several times to complain about the call she received.  Her family said she would never agree to help at home - but we would just have to show up to her house and hope she let us in.  It ended up [facility] did not pick her case up - as they didn't feel they could keep her safe in her current setting with limited family support.  Today pt did transfer to chair with max assist. She was cooperative but complained about everything  - the mean doctors, the nurses giving her shots in the cuticles of her fingernails and the staff not giving her any food.  Pt cried when I left the room and let her stay sitting up in the chiar.  I agree with SNF level care at this time but feel it will need to be a long term situation for her.  PT will continue to follow her while here in the hospital as she will allow.    Follow Up Recommendations  SNF;Supervision/Assistance - 24 hour     Equipment Recommendations  Other (comment)    Recommendations for Other Services       Precautions / Restrictions Precautions Precautions: Fall Precaution Comments: also HOH and dementia Restrictions Weight Bearing Restrictions: No LLE Weight Bearing: Weight bearing as tolerated Other Position/Activity Restrictions: Pt educated on her WBAT status.  Pts right leg IR -s he says this is normal for her    Mobility  Bed Mobility Overal bed mobility: Needs Assistance Bed Mobility: Supine to Sit     Supine  to sit: Max assist;Mod assist     General bed mobility comments: Pt moved her legs closer to edge of bed.  I helped with the chuck to get her hips closer.  Pt rolled and used rail to help with sitting up.  I had to use chuck to move her to the EOB.  Pt sat EOB for 10 minuts with good balance and flexed posture  Transfers Overall transfer level: Needs assistance Equipment used: None Transfers: Sit to/from Stand Sit to Stand: Max assist;From elevated surface Stand pivot transfers: Max assist;From elevated surface       General transfer comment: pt stood from bed with therapist directly in front of her giving max A for power up then transfer bed to chair.  I talked to pt calmly throughout transfer of what to expect. she held onto me and took short steps to get to the chair.  I then lowered her down into the chair.  Pt able to scoot back in the chair.  Ambulation/Gait             General Gait Details: unable   Stairs             Wheelchair Mobility    Modified Rankin (Stroke Patients Only)       Balance       Sitting balance - Comments: Pt sat EOB with flexed  posture - safe sitting balance                                    Cognition Arousal/Alertness: Awake/alert Behavior During Therapy: Anxious Overall Cognitive Status: No family/caregiver present to determine baseline cognitive functioning                                 General Comments: pt compliant with therapy today despite pain, needs instructions several times as well as redirecting to task      Exercises Total Joint Exercises Long Arc Quad: AROM;10 reps;Both;Seated    General Comments        Pertinent Vitals/Pain Pain Assessment: 0-10 Pain Score: 10-Worst pain ever Pain Location: L thigh, pelvis Pain Descriptors / Indicators: Grimacing;Guarding;Crying Pain Intervention(s): Limited activity within patient's tolerance;Monitored during session;Repositioned     Home Living                      Prior Function            PT Goals (current goals can now be found in the care plan section) Progress towards PT goals: Progressing toward goals    Frequency    Min 3X/week      PT Plan Current plan remains appropriate    Co-evaluation              AM-PAC PT "6 Clicks" Mobility   Outcome Measure  Help needed turning from your back to your side while in a flat bed without using bedrails?: Total Help needed moving from lying on your back to sitting on the side of a flat bed without using bedrails?: Total Help needed moving to and from a bed to a chair (including a wheelchair)?: Total Help needed standing up from a chair using your arms (e.g., wheelchair or bedside chair)?: Total Help needed to walk in hospital room?: Total Help needed climbing 3-5 steps with a railing? : Total 6 Click Score: 6    End of Session Equipment Utilized During Treatment: Gait belt Activity Tolerance: Patient limited by pain;Treatment limited secondary to agitation Patient left: in chair;with call bell/phone within reach;with chair alarm set Nurse Communication: Mobility status PT Visit Diagnosis: Other abnormalities of gait and mobility (R26.89);Repeated falls (R29.6);Pain Pain - Right/Left: Left Pain - part of body: Hip     Time: 1250-1315 PT Time Calculation (min) (ACUTE ONLY): 25 min  Charges:  $Therapeutic Activity: 23-37 mins                     09/08/2019   Rande Lawman, PT    Loyal Buba 09/08/2019, 1:29 PM

## 2019-09-08 NOTE — Progress Notes (Signed)
PROGRESS NOTE  Angelica Bell [id number] DOB: 12-20-1932 DOA: 08/31/2019 PCP: Heywood Bene, PA-C  HPI/Recap of past 39 hours: 85 year old female with past medical history of prior CVA, hypertension, arthritis, chronic pain most recently discharged 8/8 following acute CVA, dehydration, rhabdomyolysis acute renal failure who was found down at home by the grand son. Patinet was back home from SNF last thursday , was found on floor on Friday morning by grandson. He went to check onTuesday morning (9/1) and found her on floor and he called EMS and she was brought to ER. Apparently fell in bathroom, took over an hour to get her bearings and dragged herself out toward phone.Patient responded very well to narcan and narcan drip. She did not respond to fluid resuscitation in terms of Bps so PCCM asked to admit for hypotension. She also had  rhabdo,aki, lactic acidosis, borderline UA, leucocytosis CT head no acute finding. Utox with benzos and opiates. Not sure where benzodiazepines came from but note history of taking too many xanax back in 2012. Per PCCM overall impression here is she is over-medicated at home, fell, and had rhabdo/AKI from prolonged downtime and poor PO. Also has fecal impaction, deconditioning, elevated ammonia and AST/ALT pattern that could be indicative of EtOH use although she denies. Shock seems more hypovolemic given response to fluids. Questionable UTI but I guess will treat with AMS, leukocytosis, and UA, d/c abx if Pct <0.5.Patient's blood pressure improved and she was transferred to [facility] service 9/3. Patient hydrated, AKI, rhabdomyolysis improved Continue to complain of left leg pain CT scan obtained that showed nondisplaced fracture of the left pubic bone.  09/07/19: Patient was seen and examined at bedside this morning.  No acute events overnight.  Reports pain in her right breast.  Curbsided/discussed with general surgery Dr. Georgiana Spinner who reviewed her chest  imagings, no clear evidence of breast implants.  No signs of fracture.  We will continue to monitor and treat conservatively.  09/08/19: Patient was seen and examined at her bedside this morning.  She is more alert and interactive.  Reports pain in her right chest.  Pain management in place with tramadol 50 mg twice daily x2 days, oxycodone 5 mg every 4 hours as needed for severe and breakthrough pain.  CPK continues to trend down and renal function is improving.   Assessment/Plan: Active Problems:   ANXIETY DEPRESSION   Chronic pain   Essential hypertension   Abnormal liver function   Shock ([place])   UTI (urinary tract infection)   Rhabdomyolysis  Improving acute metabolic encephalopathy/delirium likely multifactorial secondary to UTI, metabolic abnormalities and opiates Continue to monitor and redirect as needed.  Improving rhabdomyolysis in the setting of fall and prolonged time laying on the floor  CPK is trending down 1648 on 09/07/2019 from 2775 on 09/06/2019 CPK 647 on 09/08/2019. Creatinine 1.01 on 09/08/2019 from 1.14 Continue IV fluid hydration normal saline at 75 cc/h Continue to trend CPK with daily BMPs Pain management in place with tramadol 50 mg twice daily x2 days Continue oxycodone 5 mg every 4 hours as needed for severe and breakthrough pain Continue bowel regimen to avoid opiate-induced constipation  Left pubic bone fracture post fall Optimize pain control Pain management as stated above Continue bowel regimen Senokot 2 tablet twice daily and MiraLAX daily to avoid opiate-induced constipation Closely monitor for side effects PT OT recommended SNF CSW consulted to assist with SNF placement Continue fall precautions Awaiting bed placement at SNF  Right breast injury with  questionable breast implants Patient reports she has had breast implants No clear evidence of breast implant on chest imagings Curb sided with general surgery Dr. Georgiana Spinner who also reviewed chest  imagings Continue conservative management Continue pain control  Uncontrolled hypertension Not on blood pressure medications at home Fluctuates and worsened by pain Control pain Continue Norvasc to 10 mg daily, started here Continue to monitor vital signs Continue IV labetalol as needed  Hypokalemia Potassium 3.2 Repleted with KCl p.o. 40 mEq x 2  Hypomagnesemia Magnesium 1.6 Repleted with IV magnesium 4 g once.  Pseudomonal UTI Urine culture from 08/31/19 positive for pseudomonas aeruginosa resistant to ciprofloxacin Completed 7 days of IV cefepime Blood cultures x2 drawn on 08/31/2019 final negative.  Hypokalemia, persistent Potassium 3.4 Repleted with p.o. KCl 40 mEq once Added 2 g IV magnesium Repeat BMP and magnesium level in the morning  Pseudo-hypocalcemia Calcium 7.7, albumin 2.3 Corrected calcium for albumin 9.1  Improving AKI on CKD 3 Appears to be at her baseline creatinine 1.1 with GFR 44  Continue to avoid nephrotoxins Presented with creatinine of 1.8 with GFR of 25 Continue IV fluid hydration in the setting of rhabdomyolysis Continue to monitor urine output  Elevated LFTs, improving Elevated ALT and AST, levels continue to trend down Acute hepatitis panel negative  Chronic anxiety/depression Continue escitalopram  GERD Continue PPI  Hyperlipidemia Continue statin  Ambulatory dysfunction/physical debility PT OT assessment recommended SNF CSW assisting with SNF placement Continue fall precautions  DVT prophylaxis:Heparin subcu 3 times daily Code Status: Full code Family Communication:  None at bedside.  Will call grandson.   Disposition Plan:  Pending bed placement.  Continue to replace electrolytes.  Continue IV fluid hydration for rhabdomyolysis.  Consultants:  CCM  Procedures: CT brain 9/1:Evolutionary findings in the small white matter infarcts in the left occipital lobe right parietal lobe, currently hypodense indicating increased  chronicity. Both of these were subacute at the time of prior brain MRI of 07/27/2019. 2. Old right cerebellar infarct. 3. Periventricular white matter and corona radiata hypodensities favor chronic ischemic microvascular white matter disease. 4. No acute findings. CT chest abd/pelvis 9/1:Large sliding-type hiatal hernia.1.6 cm left thyroid nodule is noted. Thyroid ultrasound is recommended for further evaluation. Large amount of stool is seen in sigmoid colon and rectum concerning for possible impaction.Bibasilar bronchiectatic changes near the area of hiatal hernia  CT left hip9/2 IMPRESSION: 1. Nondisplaced fracture of the left parasymphyseal pubic bone. 2. Age-indeterminate nondisplaced fracture involving the anterior column of the left acetabulum. The appearance favors a subacute fracture. 3. Stable orthopedic hardware involving the left lower extremity. 4. Nonspecific soft tissue swelling about the left lower extremity.  Microbiology:  UC 9/1>>> Pseudomonas BCX2 9/1>>>NGTD   Objective: Vitals:   09/07/19 1258 09/07/19 2132 09/08/19 0539 09/08/19 0846  BP: 137/75 (!) 154/71 (!) 163/65 (!) 150/80  Pulse: 80 84 79   Resp: 16 16 16    Temp: 98.5 F (36.9 C) 99.2 F (37.3 C) 98.6 F (37 C)   TempSrc: Oral     SpO2: 93% 94% 94%   Weight:      Height:        Intake/Output Summary (Last 24 hours) at 09/08/2019 1259 Last data filed at 09/08/2019 0900 Gross per 24 hour  Intake 1918.26 ml  Output 1550 ml  Net 368.26 ml   Filed Weights   09/02/19 0500 09/03/19 0500 09/04/19 0500  Weight: 53.4 kg 63.7 kg 60.3 kg    Exam:  . General: 85 y.o. year-old  female frail-appearing no acute distress.  Alert and interactive.   . Cardiovascular: Regular rate and rhythm no rubs or gallops no JVD or thyromegaly.  Right breast bruising with tenderness on palpation.  Marland Kitchen Respiratory: Clear to auscultation no wheezes or rales.  Poor inspiratory effort.   . Abdomen: Soft nontender  nondistended bowel sounds present. . Musculoskeletal: Trace lower extremity edema.  Peripheral pulses in all 4 extremities. Marland Kitchen Psychiatry: Mood is appropriate for condition and setting.   Data Reviewed: CBC: Recent Labs  Lab 09/02/19 0241 09/03/19 0237 09/04/19 0314  WBC 14.1* 13.2* 17.3*  HGB 9.7* 10.6* 11.3*  HCT 30.8* 32.4* 34.3*  MCV 90.9 88.0 84.7  PLT 170 202 [id number]   Basic Metabolic Panel: Recent Labs  Lab 09/04/19 0314 09/05/19 0410 09/06/19 0615 09/07/19 0239 09/08/19 0228  NA 139 141 140 139 136  K 3.1* 3.4* 2.9* 3.4* 3.2*  CL 108 109 105 109 108  CO2 20* 20* 25 23 23   GLUCOSE 93 118* 100* 94 100*  BUN 20 19 17 17 16   CREATININE 1.35* 1.24* 1.23* 1.14* 1.01*  CALCIUM 8.1* 8.2* 7.7* 8.1* 7.9*  MG  --   --   --   --  1.6*   GFR: Estimated Creatinine Clearance: 38.1 mL/min (A) (by C-G formula based on SCr of 1.01 mg/dL (H)). Liver Function Tests: Recent Labs  Lab 09/02/19 0241 09/03/19 0237 09/04/19 0314  AST 354* 283* 250*  ALT 362* 320* 268*  ALKPHOS 67 68 81  BILITOT 0.4 0.6 1.0  PROT 3.8* 4.2* 4.5*  ALBUMIN 2.0* 2.1* 2.3*   No results for input(s): LIPASE, AMYLASE in the last 168 hours. Recent Labs  Lab 09/03/19 0237  AMMONIA 15   Coagulation Profile: No results for input(s): INR, PROTIME in the last 168 hours. Cardiac Enzymes: Recent Labs  Lab 09/04/19 0314 09/05/19 0410 09/06/19 0615 09/07/19 0239 09/08/19 0228  CKTOTAL 5,390* 5,310* 2,775* 1,648* 647*   BNP (last 3 results) No results for input(s): PROBNP in the last 8760 hours. HbA1C: No results for input(s): HGBA1C in the last 72 hours. CBG: Recent Labs  Lab 09/03/19 0517 09/03/19 0825 09/04/19 0118 09/04/19 0533 09/05/19 1101  GLUCAP 97 94 103* 89 131*   Lipid Profile: No results for input(s): CHOL, HDL, LDLCALC, TRIG, CHOLHDL, LDLDIRECT in the last 72 hours. Thyroid Function Tests: No results for input(s): TSH, T4TOTAL, FREET4, T3FREE, THYROIDAB in the last 72 hours.  Anemia Panel: No results for input(s): VITAMINB12, FOLATE, FERRITIN, TIBC, IRON, RETICCTPCT in the last 72 hours. Urine analysis:    Component Value Date/Time   COLORURINE YELLOW 08/31/2019 0745   APPEARANCEUR HAZY (A) 08/31/2019 0745   LABSPEC 1.013 08/31/2019 0745   PHURINE 5.0 08/31/2019 0745   GLUCOSEU NEGATIVE 08/31/2019 0745   HGBUR LARGE (A) 08/31/2019 0745   HGBUR negative 04/24/2009 1635   BILIRUBINUR NEGATIVE 08/31/2019 0745   BILIRUBINUR negative 05/29/2011 1439   KETONESUR NEGATIVE 08/31/2019 0745   PROTEINUR 30 (A) 08/31/2019 0745   UROBILINOGEN 0.2 01/26/2014 1912   NITRITE NEGATIVE 08/31/2019 0745   LEUKOCYTESUR SMALL (A) 08/31/2019 0745   Sepsis Labs: @LABRCNTIP (procalcitonin:4,lacticidven:4)  ) Recent Results (from the past 240 hour(s))  Blood Culture (routine x 2)     Status: None   Collection Time: 08/31/19  7:30 AM   Specimen: BLOOD  Result Value Ref Range Status   Specimen Description BLOOD LEFT ANTECUBITAL  Final   Special Requests   Final    BOTTLES DRAWN AEROBIC AND ANAEROBIC Blood  Culture adequate volume   Culture   Final    NO GROWTH 5 DAYS Performed at [facility], [place] [address], [place], [place] [address]    Report Status 09/05/2019 FINAL  Final  Blood Culture (routine x 2)     Status: None   Collection Time: 08/31/19  7:39 AM   Specimen: BLOOD RIGHT FOREARM  Result Value Ref Range Status   Specimen Description BLOOD RIGHT FOREARM  Final   Special Requests   Final    BOTTLES DRAWN AEROBIC AND ANAEROBIC Blood Culture results may not be optimal due to an inadequate volume of blood received in culture bottles   Culture   Final    NO GROWTH 5 DAYS Performed at [facility], [place] [address], [place], [place] [address]    Report Status 09/05/2019 FINAL  Final  SARS Coronavirus 2 [facility] order, Performed in [facility] hospital lab) Nasopharyngeal Nasopharyngeal Swab     Status: None   Collection Time: 08/31/19  8:00 AM    Specimen: Nasopharyngeal Swab  Result Value Ref Range Status   SARS Coronavirus 2 NEGATIVE NEGATIVE Final    Comment: (NOTE) If result is NEGATIVE SARS-CoV-2 target nucleic acids are NOT DETECTED. The SARS-CoV-2 RNA is generally detectable in upper and lower  respiratory specimens during the acute phase of infection. The lowest  concentration of SARS-CoV-2 viral copies this assay can detect is 250  copies / mL. A negative result does not preclude SARS-CoV-2 infection  and should not be used as the sole basis for treatment or other  patient management decisions.  A negative result may occur with  improper specimen collection / handling, submission of specimen other  than nasopharyngeal swab, presence of viral mutation(s) within the  areas targeted by this assay, and inadequate number of viral copies  (<250 copies / mL). A negative result must be combined with clinical  observations, patient history, and epidemiological information. If result is POSITIVE SARS-CoV-2 target nucleic acids are DETECTED. The SARS-CoV-2 RNA is generally detectable in upper and lower  respiratory specimens dur ing the acute phase of infection.  Positive  results are indicative of active infection with SARS-CoV-2.  Clinical  correlation with patient history and other diagnostic information is  necessary to determine patient infection status.  Positive results do  not rule out bacterial infection or co-infection with other viruses. If result is PRESUMPTIVE POSTIVE SARS-CoV-2 nucleic acids MAY BE PRESENT.   A presumptive positive result was obtained on the submitted specimen  and confirmed on repeat testing.  While 2019 novel coronavirus  (SARS-CoV-2) nucleic acids may be present in the submitted sample  additional confirmatory testing may be necessary for epidemiological  and / or clinical management purposes  to differentiate between  SARS-CoV-2 and other Sarbecovirus currently known to infect humans.  If  clinically indicated additional testing with an alternate test  methodology [phone]) is advised. The SARS-CoV-2 RNA is generally  detectable in upper and lower respiratory sp ecimens during the acute  phase of infection. The expected result is Negative. Fact Sheet for Patients:  StrictlyIdeas.no Fact Sheet for Healthcare Providers: BankingDealers.co.za This test is not yet approved or cleared by the [place] FDA and has been authorized for detection and/or diagnosis of SARS-CoV-2 by FDA under an Emergency Use Authorization (EUA).  This EUA will remain in effect (meaning this test can be used) for the duration of the COVID-19 declaration under Section 564(b)(1) of the Act, 21 U.S.C. section 360bbb-3(b)(1), unless  the authorization is terminated or revoked sooner. Performed at [facility], [place] [address] [place], [place] [address]   Urine culture     Status: Abnormal   Collection Time: 08/31/19  9:20 AM   Specimen: In/Out Cath Urine  Result Value Ref Range Status   Specimen Description IN/OUT CATH URINE  Final   Special Requests   Final    NONE Performed at [facility], [place] [address] [place], [place] [address]    Culture >=100,000 COLONIES/mL PSEUDOMONAS AERUGINOSA (A)  Final   Report Status 09/02/2019 FINAL  Final   Organism ID, Bacteria PSEUDOMONAS AERUGINOSA (A)  Final      Susceptibility   Pseudomonas aeruginosa - MIC*    CEFTAZIDIME 2 SENSITIVE Sensitive     CIPROFLOXACIN >=4 RESISTANT Resistant     GENTAMICIN 4 SENSITIVE Sensitive     IMIPENEM <=0.25 SENSITIVE Sensitive     PIP/TAZO <=4 SENSITIVE Sensitive     CEFEPIME 2 SENSITIVE Sensitive     * >=100,000 COLONIES/mL PSEUDOMONAS AERUGINOSA  MRSA PCR Screening     Status: None   Collection Time: 08/31/19  2:28 PM   Specimen: Nasal Mucosa; Nasopharyngeal  Result Value Ref Range Status   MRSA by PCR NEGATIVE NEGATIVE Final    Comment:        The  GeneXpert MRSA Assay (FDA approved for NASAL specimens only), is one component of a comprehensive MRSA colonization surveillance program. It is not intended to diagnose MRSA infection nor to guide or monitor treatment for MRSA infections. Performed at [facility], [place] [address], [place], [place] [address]       Studies: No results found.  Scheduled Meds: . amLODipine  10 mg Oral Daily  . aspirin EC  81 mg Oral Daily  . bisacodyl  10 mg Rectal Once  . Chlorhexidine Gluconate Cloth  6 each Topical Daily  . escitalopram  20 mg Oral Daily  . heparin  5,000 Units Subcutaneous Q8H  . pantoprazole  40 mg Oral Daily  . polyethylene glycol  17 g Oral Daily  . potassium chloride  40 mEq Oral BID  . senna-docusate  2 tablet Oral BID  . traMADol  50 mg Oral Q12H    Continuous Infusions: . sodium chloride 75 mL/hr (09/08/19 1115)     LOS: 8 days     Kayleen Memos, MD [facility] Pager [phone]  If 7PM-7AM, please contact night-coverage [email or web address] Password [email or web address] 09/08/2019, 12:59 PM

## 2019-09-08 NOTE — Progress Notes (Signed)
Occupational Therapy Treatment Patient Details Name: Angelica Bell MRN: [id number] DOB: 11-20-1932 Today's Date: 09/08/2019    History of present illness Pt adm after fall and found on floor at home. Pt with acute toxic/metabolic encephalopathy, UTI, rhabdo. Pt also found to have lt pubic and acetabular fx. PMH - RA, rt THR, chronic back pain, PE, scoliosis, osteoporosis, HTN, recent CVA   OT comments  Pt tolerating recliner for more than 1.5 hours today. Pt crying out that she was cold and L LE pain 8/10. Rn aware of pain. Charlaine Dalton was not successful as pt unable to pull self from bar as of now. NT and OT on either side for stand pivot as pt took a few steps for stand pivot from recliner to bed.  Pt stood pretty well once upright. Pt repositioned in bed with maxA and maxA+2 for bed mobility as pt too weak to perform own bed mobility. Pt performing grooming tasks in recliner. Pt would benefit from continued OT skilled services for ADL, mobility and safety in SNF setting. OT following acutely.   Follow Up Recommendations  SNF;Supervision/Assistance - 24 hour    Equipment Recommendations  Other (comment)(to be determined at next venue)    Recommendations for Other Services      Precautions / Restrictions Precautions Precautions: Fall Precaution Comments: also HOH and dementia Restrictions Weight Bearing Restrictions: No LLE Weight Bearing: Weight bearing as tolerated Other Position/Activity Restrictions: Pt educated on her WBAT status.  Pts right leg IR -s he says this is normal for her       Mobility Bed Mobility Overal bed mobility: Needs Assistance Bed Mobility: Sit to Supine     Supine to sit: Max assist;Mod assist Sit to supine: Max assist;+2 for physical assistance;+2 for safety/equipment   General bed mobility comments: Pt able to guide BLEs and trunk, but unable to maneuver self in bed  Transfers Overall transfer level: Needs assistance Equipment used: None Transfers:  Sit to/from Stand Sit to Stand: Max assist;+2 physical assistance;+2 safety/equipment Stand pivot transfers: Max assist;+2 physical assistance;+2 safety/equipment       General transfer comment: NT and OT on either side for stand pivot as pt took a few steps this way    Balance Overall balance assessment: Needs assistance Sitting-balance support: Bilateral upper extremity supported;Feet unsupported Sitting balance-Leahy Scale: Fair Sitting balance - Comments: Pt sat EOB with flexed posture - safe sitting balance   Standing balance support: Bilateral upper extremity supported;During functional activity Standing balance-Leahy Scale: Poor Standing balance comment: Pt able to tolerate taking a few steps from recliner to bed maxA+2               High Level Balance Comments: maxA +2           ADL either performed or assessed with clinical judgement   ADL Overall ADL's : Needs assistance/impaired     Grooming: Min guard;Sitting Grooming Details (indicate cue type and reason): supported sitting to wash face and hands                 Toilet Transfer: Maximal assistance;+2 for physical assistance;+2 for safety/equipment;BSC Toilet Transfer Details (indicate cue type and reason): pt can take a few steps toward [facility] <-> recliner         Functional mobility during ADLs: Maximal assistance;+2 for physical assistance;+2 for safety/equipment General ADL Comments: Pt very limited by pain; +2 maxA for stand pivot transfers; set-upA for light grooming     Vision   Vision  Assessment?: No apparent visual deficits   Perception     Praxis      Cognition Arousal/Alertness: Awake/alert Behavior During Therapy: Anxious Overall Cognitive Status: No family/caregiver present to determine baseline cognitive functioning Area of Impairment: Memory;Following commands;Safety/judgement                     Memory: Decreased short-term memory Following Commands: Follows one  step commands with increased time Safety/Judgement: Decreased awareness of deficits;Decreased awareness of safety     General Comments: pt follows commands with verbal cueing multiple times        Exercises Total Joint Exercises Long Arc Quad: AROM;10 reps;Both;Seated   Shoulder Instructions       General Comments      Pertinent Vitals/ Pain       Pain Assessment: 0-10 Pain Score: 8  Pain Location: L thigh, pelvis Pain Descriptors / Indicators: Grimacing;Guarding;Crying Pain Intervention(s): Limited activity within patient's tolerance  Home Living                                          Prior Functioning/Environment              Frequency  Min 2X/week        Progress Toward Goals  OT Goals(current goals can now be found in the care plan section)  Progress towards OT goals: Progressing toward goals  Acute Rehab OT Goals Patient Stated Goal: be able to walk OT Goal Formulation: With patient Time For Goal Achievement: 09/17/19 Potential to Achieve Goals: Fair ADL Goals Pt Will Perform Upper Body Bathing: with set-up;bed level Pt Will Perform Lower Body Bathing: with mod assist;bed level Pt Will Transfer to Toilet: with mod assist;bedside commode;stand pivot transfer Additional ADL Goal #1: Pt will tolerate EOB activities for 10 minutes in prep for adls on EOB with min assist.  Plan Discharge plan remains appropriate    Co-evaluation                 AM-PAC OT "6 Clicks" Daily Activity     Outcome Measure   Help from another person eating meals?: None Help from another person taking care of personal grooming?: A Little Help from another person toileting, which includes using toliet, bedpan, or urinal?: Total Help from another person bathing (including washing, rinsing, drying)?: Total Help from another person to put on and taking off regular upper body clothing?: Total Help from another person to put on and taking off regular  lower body clothing?: Total 6 Click Score: 11    End of Session Equipment Utilized During Treatment: Gait belt  OT Visit Diagnosis: Pain;Unsteadiness on feet (R26.81);Other abnormalities of gait and mobility (R26.89);Other symptoms and signs involving cognitive function;Repeated falls (R29.6) Pain - Right/Left: Left Pain - part of body: Leg   Activity Tolerance Patient limited by pain   Patient Left in bed;with call bell/phone within reach;with bed alarm set   Nurse Communication Mobility status;Patient requests pain meds        Time: [id number] OT Time Calculation (min): 45 min  Charges: OT General Charges $OT Visit: 1 Visit OT Treatments $Self Care/Home Management : 23-37 mins $Therapeutic Activity: 8-22 mins  Darryl Nestle) Marsa Aris OTR/L Acute Rehabilitation Services Pager: [phone] Office: [phone]    Audie Pinto 09/08/2019, 4:52 PM

## 2019-09-09 DIAGNOSIS — F341 Dysthymic disorder: Secondary | ICD-10-CM

## 2019-09-09 LAB — BASIC METABOLIC PANEL
Anion gap: 8 (ref 5–15)
BUN: 18 mg/dL (ref 8–23)
CO2: 22 mmol/L (ref 22–32)
Calcium: 7.9 mg/dL — ABNORMAL LOW (ref 8.9–10.3)
Chloride: 107 mmol/L (ref 98–111)
Creatinine, Ser: 1.04 mg/dL — ABNORMAL HIGH (ref 0.44–1.00)
GFR calc Af Amer: 57 mL/min — ABNORMAL LOW (ref >60)
GFR calc non Af Amer: 49 mL/min — ABNORMAL LOW (ref >60)
Glucose, Bld: 94 mg/dL (ref 70–99)
Potassium: 3.3 mmol/L — ABNORMAL LOW (ref 3.5–5.1)
Sodium: 137 mmol/L (ref 135–145)

## 2019-09-09 LAB — CK: Total CK: 390 U/L — ABNORMAL HIGH (ref 38–234)

## 2019-09-09 MED ORDER — POTASSIUM CHLORIDE 20 MEQ/15ML (10%) PO SOLN
40.0000 meq | Freq: Once | ORAL | Status: DC
  Filled 2019-09-09: qty 30

## 2019-09-09 NOTE — Progress Notes (Signed)
Spoke with pt's grandson [name] informing him on moving pt to room [phone].

## 2019-09-09 NOTE — TOC Progression Note (Signed)
Transition of Care [facility]) - Progression Note    Patient Details  Name: Angelica Bell MRN: [id number] Date of Birth: 1932/02/26  Transition of Care [facility]) CM/SW [place], LCSW Phone Number: 09/09/2019, 4:37 PM  Clinical Narrative:    CSW awaiting a response from financial counseling about possibility of Medicaid application for patient due to long term care placement need since family reports being unable to afford SNF copays.    Expected Discharge Plan: Skilled Nursing Facility Barriers to Discharge: Ship broker, Continued Medical Work up  Expected Discharge Plan and Services Expected Discharge Plan: [place] In-house Referral: Clinical Social Work Discharge Planning Services: NA Post Acute Care Choice: [place] Living arrangements for the past 2 months: [place], Single Family Home                 DME Arranged: N/A DME Agency: NA       HH Arranged: NA HH Agency: NA         Social Determinants of Health (SDOH) Interventions    Readmission Risk Interventions No flowsheet data found.

## 2019-09-09 NOTE — Progress Notes (Addendum)
PROGRESS NOTE  Angelica Bell [id number] DOB: Mar 17, 1932 DOA: 08/31/2019 PCP: Heywood Bene, PA-C  Brief History   85 year old female with past medical history of prior CVA, hypertension, arthritis, chronic pain most recently discharged 8/8 following acute CVA, dehydration, rhabdomyolysis acute renal failure who was found down at home by the grand son. Patinet was back home from SNF last thursday , was found on floor on Friday morning by grandson. He went to check onTuesday morning (9/1) and found her on floor and he called EMS and she was brought to ER.Apparently fell in bathroom, took over an hour to get her bearings and dragged herself out toward phone.Patient responded very well to narcan and narcan drip. She did not respond to fluid resuscitation in terms of Bps so PCCM asked to admit for hypotension. She also had rhabdo,aki, lactic acidosis, borderline UA, leucocytosis CT head no acute finding. Utox with benzos and opiates. Not sure where benzodiazepines came from but note history of taking too many xanax back in 2012. Per PCCM overall impression here is she is over-medicated at home, fell, and had rhabdo/AKI from prolonged downtime and poor PO. Also has fecal impaction, deconditioning, elevated ammonia and AST/ALT pattern that could be indicative of EtOH use although she denies. Shock seems more hypovolemic given response to fluids. Questionable UTI but I guess will treat with AMS, leukocytosis, and UA, d/c abx if Pct <0.5.Patient's blood pressure improved and she was transferred to [facility] service 9/3. Patient hydrated, AKI, rhabdomyolysis improved Continue to complain of left leg pain CT scan obtained that showed nondisplaced fracture of the left pubic bone. The patient has had complaints of pain in her right breast. She claims that she has breast implants. CXR however demonstrates no evidence of implants or fracture. General surgery was curb sided. They also see no evidence of  implants. Pain will be treated conservatively with oral pain medications.  Consultants  . None  Procedures  . None  Antibiotics   Anti-infectives (From admission, onward)   Start     Dose/Rate Route Frequency Ordered Stop   09/01/19 0900  ceFEPIme (MAXIPIME) 2 g in sodium chloride 0.9 % 100 mL IVPB     2 g 200 mL/hr over 30 Minutes Intravenous Every 24 hours 08/31/19 0900 09/07/19 1138   08/31/19 0900  vancomycin variable dose per unstable renal function (pharmacist dosing)  Status:  Discontinued      Does not apply See admin instructions 08/31/19 0900 09/01/19 0924   08/31/19 0800  ceFEPIme (MAXIPIME) 2 g in sodium chloride 0.9 % 100 mL IVPB     2 g 200 mL/hr over 30 Minutes Intravenous  Once 08/31/19 0747 08/31/19 0948   08/31/19 0800  metroNIDAZOLE (FLAGYL) IVPB 500 mg     500 mg 100 mL/hr over 60 Minutes Intravenous  Once 08/31/19 0747 08/31/19 1034   08/31/19 0800  vancomycin (VANCOCIN) IVPB 1000 mg/200 mL premix     1,000 mg 200 mL/hr over 60 Minutes Intravenous  Once 08/31/19 0747 08/31/19 1419    .  Marland Kitchen   Subjective  The patient is resting comfortably. No new complaints. Sitter states that she has been calm and cooperative.  Objective   Vitals:  Vitals:   09/09/19 0543 09/09/19 1410  BP: (!) 151/69 (!) 151/71  Pulse: 83 83  Resp: 16 16  Temp: 98.3 F (36.8 C) 98.8 F (37.1 C)  SpO2: 93% 95%    Exam:  Constitutional:  . The patient is awake, alert, and oriented  x 3. No acute distress. Respiratory:  . There is no increased work of breathing. . No wheezes, rales, or rhonchi. . No tactile fremitus. Cardiovascular:  . Regular rate and rhythm. . No murmurs, ectopy, or gallups. . No lateral PMI. No thrills. Abdomen:  . Abdomen is soft, non-tender, non-distended. . No hernias, masses, or organomegaly. . Normoactive bowel sounds.  Musculoskeletal:  . No cyanosis, clubbing or edema Skin:  . No rashes, lesions, ulcers palpation of skin: no induration or  nodule  I have personally reviewed the following:   Today's Data  . Vitals, BMP  Scheduled Meds: . amLODipine  10 mg Oral Daily  . aspirin EC  81 mg Oral Daily  . bisacodyl  10 mg Rectal Once  . Chlorhexidine Gluconate Cloth  6 each Topical Daily  . escitalopram  20 mg Oral Daily  . heparin  5,000 Units Subcutaneous Q8H  . pantoprazole  40 mg Oral Daily  . polyethylene glycol  17 g Oral Daily  . potassium chloride  40 mEq Oral Once  . senna-docusate  2 tablet Oral BID  . traMADol  50 mg Oral Q12H   Continuous Infusions: . sodium chloride 75 mL/hr at 09/09/19 0303    Active Problems:   ANXIETY DEPRESSION   Chronic pain   Essential hypertension   Abnormal liver function   Shock ([place])   UTI (urinary tract infection)   Rhabdomyolysis   LOS: 9 days   A & P  Improving acute metabolic encephalopathy/delirium likely multifactorial secondary to UTI, metabolic abnormalities and opiates Pt is more calm today. Sitter has been discontinued. Monitor.  Improving rhabdomyolysis in the setting of fall and prolonged time laying on the floor  CPK is trending down 1648 on 09/07/2019 from 2775 on 09/06/2019 CPK 390 on 09/09/2019. Creatinine 1.01 on 09/08/2019 from 1.14 Continue IV fluid hydration normal saline at 75 cc/h Continue to trend CPK with daily BMPs Pain management in place with tramadol 50 mg twice daily x2 days Continue oxycodone 5 mg every 4 hours as needed for severe and breakthrough pain Continue bowel regimen to avoid opiate-induced constipation  Left pubic bone fracture post fall Optimize pain control Pain management as stated above Continue bowel regimen Senokot 2 tablet twice daily and MiraLAX daily to avoid opiate-induced constipation Closely monitor for side effects PT OT recommended SNF CSW consulted to assist with SNF placement Continue fall precautions Awaiting bed placement at SNF  Right breast injury with questionable breast implants Patient reports she has  had breast implants No clear evidence of breast implant on chest imagings Curb sided with general surgery Dr. Georgiana Spinner who also reviewed chest imagings Continue conservative management Continue pain control  Uncontrolled hypertension Not on blood pressure medications at home Fluctuates and worsened by pain Control pain Continue Norvasc to 10 mg daily, started here Continue to monitor vital signs Continue IV labetalol as needed  Hypokalemia Potassium 3.2 Repleted with KCl p.o. 40 mEq x 2  Hypomagnesemia Magnesium 1.6 Repleted with IV magnesium 4 g once.  Pseudomonal UTI Urine culture from 08/31/19 positive for pseudomonas aeruginosa resistant to ciprofloxacin Completed 7 days of IV cefepime Blood cultures x2 drawn on 08/31/2019 final negative.  Hypokalemia, persistent Potassium 3.4 Repleted with p.o. KCl 40 mEq once Added 2 g IV magnesium Repeat BMP and magnesium level in the morning  Pseudo-hypocalcemia Calcium 7.7, albumin 2.3 Corrected calcium for albumin 9.1  Improving AKI on CKD 3 Appears to be at her baseline creatinine 1.04 with GFR 49  Continue to avoid nephrotoxins Presented with creatinine of 1.8 with GFR of 25 Continue IV fluid hydration in the setting of rhabdomyolysis Continue to monitor urine output  Elevated LFTs, improving Elevated ALT and AST, levels continue to trend down Acute hepatitis panel negative  Chronic anxiety/depression Continue escitalopram  GERD Continue PPI  Hyperlipidemia Continue statin  Ambulatory dysfunction/physical debility PT OT assessment recommended SNF CSW assisting with SNF placement Continue fall precautions  I have seen and examined this patient myself. I have spent 32 minutes in her evaluation and care.  DVT prophylaxis:Heparin subcu 3 times daily Code Status: Full code Family Communication: None at bedside.  Will call grandson. Disposition Plan: Pending bed placement.  Continue to replace  electrolytes.  Continue IV fluid hydration for rhabdomyolysis.  Mckinzy Fuller, DO [facility] Direct contact: see [email or web address]  7PM-7AM contact night coverage as above 09/09/2019, 5:50 PM  LOS: 9 days

## 2019-09-09 NOTE — Progress Notes (Signed)
Pt refused oral potassium twice. Educated pt on levels being slightly low. Pt still refused, stating that it was nasty. Offered to put solution in juice, she refused.

## 2019-09-10 LAB — BASIC METABOLIC PANEL
Anion gap: 7 (ref 5–15)
Anion gap: 7 (ref 5–15)
BUN: 15 mg/dL (ref 8–23)
BUN: 13 mg/dL (ref 8–23)
CO2: 23 mmol/L (ref 22–32)
CO2: 24 mmol/L (ref 22–32)
Calcium: 8.1 mg/dL — ABNORMAL LOW (ref 8.9–10.3)
Calcium: 7.9 mg/dL — ABNORMAL LOW (ref 8.9–10.3)
Chloride: 108 mmol/L (ref 98–111)
Chloride: 108 mmol/L (ref 98–111)
Creatinine, Ser: 1.04 mg/dL — ABNORMAL HIGH (ref 0.44–1.00)
Creatinine, Ser: 1.03 mg/dL — ABNORMAL HIGH (ref 0.44–1.00)
GFR calc Af Amer: 57 mL/min — ABNORMAL LOW (ref >60)
GFR calc Af Amer: 57 mL/min — ABNORMAL LOW (ref >60)
GFR calc non Af Amer: 49 mL/min — ABNORMAL LOW (ref >60)
GFR calc non Af Amer: 50 mL/min — ABNORMAL LOW (ref >60)
Glucose, Bld: 95 mg/dL (ref 70–99)
Glucose, Bld: 168 mg/dL — ABNORMAL HIGH (ref 70–99)
Potassium: 2.8 mmol/L — ABNORMAL LOW (ref 3.5–5.1)
Potassium: 4.2 mmol/L (ref 3.5–5.1)
Sodium: 138 mmol/L (ref 135–145)
Sodium: 139 mmol/L (ref 135–145)

## 2019-09-10 LAB — CK: Total CK: 256 U/L — ABNORMAL HIGH (ref 38–234)

## 2019-09-10 MED ORDER — POTASSIUM CHLORIDE 10 MEQ/100ML IV SOLN
10.0000 meq | INTRAVENOUS | Status: AC
  Administered 2019-09-10 (×4): 10 meq via INTRAVENOUS
  Filled 2019-09-10 (×4): qty 100

## 2019-09-10 MED ORDER — OXYCODONE HCL 5 MG PO TABS
15.0000 mg | ORAL_TABLET | ORAL | Status: DC | PRN
  Administered 2019-09-10 – 2019-09-15 (×21): 15 mg via ORAL
  Filled 2019-09-10 (×22): qty 3

## 2019-09-10 MED ORDER — POTASSIUM CHLORIDE CRYS ER 20 MEQ PO TBCR
40.0000 meq | EXTENDED_RELEASE_TABLET | Freq: Once | ORAL | Status: DC
  Filled 2019-09-10: qty 2

## 2019-09-10 NOTE — Progress Notes (Signed)
Physical Therapy Treatment Patient Details Name: Angelica Bell MRN: [id number] DOB: Aug 19, 1932 Today's Date: 09/10/2019    History of Present Illness Pt adm after fall and found on floor at home. Pt with acute toxic/metabolic encephalopathy, UTI, rhabdo. Pt also found to have lt pubic and acetabular fx. PMH - RA, rt THR, chronic back pain, PE, scoliosis, osteoporosis, HTN, recent CVA    PT Comments    Pt supine on arrival complaining about staff and not getting the pain medicine she wants. Pt highly distractable and does not demonstrate internal motivation to move or progress function. Pt with bed wet on arrival despite foley with education for need to transfer to increase function and to change linens. Pt requires max assist to initiate and perform all movement with complaint of left hip pain during function but as soon as still calm and on to another topic to complain about. RN present during end of session with education for use of stedy to pivot.  D/C plan remains appropriate.    Follow Up Recommendations  SNF;Supervision/Assistance - 24 hour     Equipment Recommendations  Wheelchair (measurements PT);Hospital bed;Wheelchair cushion (measurements PT)    Recommendations for Other Services       Precautions / Restrictions Precautions Precautions: Fall Precaution Comments: also HOH and dementia Restrictions LLE Weight Bearing: Weight bearing as tolerated Other Position/Activity Restrictions: Pt educated on her WBAT status.  Pts right leg IR is baseline    Mobility  Bed Mobility Overal bed mobility: Needs Assistance Bed Mobility: Supine to Sit     Supine to sit: Max assist;+2 for physical assistance;HOB elevated     General bed mobility comments: max assist to move bil LE and trunk, pt with very limited assist through RUE to attempt to assist with rising from surface, use of pad to pivot to EOB  Transfers Overall transfer level: Needs assistance   Transfers: Sit  to/from Stand;Stand Pivot Transfers Sit to Stand: Max assist;+2 physical assistance;+2 safety/equipment Stand pivot transfers: Max assist;+2 physical assistance;+2 safety/equipment       General transfer comment: max assist +2 to stand from elevated bed and stedy with stedy for pivot to chair  Ambulation/Gait                 Stairs             Wheelchair Mobility    Modified Rankin (Stroke Patients Only)       Balance Overall balance assessment: Needs assistance Sitting-balance support: Bilateral upper extremity supported;Feet unsupported Sitting balance-Leahy Scale: Poor Sitting balance - Comments: EOB with minguard assist with flexed posture   Standing balance support: Bilateral upper extremity supported Standing balance-Leahy Scale: Zero Standing balance comment: pt with bil UE supported on Stedy and once in stedy leaning with head on bar and required max assist to sit up                            Cognition Arousal/Alertness: Awake/alert Behavior During Therapy: Anxious Overall Cognitive Status: No family/caregiver present to determine baseline cognitive functioning Area of Impairment: Memory;Following commands;Safety/judgement                   Current Attention Level: Sustained Memory: Decreased short-term memory Following Commands: Follows one step commands with increased time;Follows one step commands inconsistently Safety/Judgement: Decreased awareness of deficits;Decreased awareness of safety   Problem Solving: Requires verbal cues;Requires tactile cues General Comments: pt follows commands 50% of the  time with max encouragement and assist      Exercises      General Comments        Pertinent Vitals/Pain Pain Score: 6  Pain Location: L thigh, pelvis Pain Descriptors / Indicators: Grimacing;Guarding;Aching Pain Intervention(s): Limited activity within patient's tolerance;Monitored during session;Repositioned;RN gave  pain meds during session    Home Living                      Prior Function            PT Goals (current goals can now be found in the care plan section) Progress towards PT goals: Not progressing toward goals - comment    Frequency    Min 2X/week      PT Plan Current plan remains appropriate;Frequency needs to be updated    Co-evaluation              AM-PAC PT "6 Clicks" Mobility   Outcome Measure  Help needed turning from your back to your side while in a flat bed without using bedrails?: Total Help needed moving from lying on your back to sitting on the side of a flat bed without using bedrails?: Total Help needed moving to and from a bed to a chair (including a wheelchair)?: Total Help needed standing up from a chair using your arms (e.g., wheelchair or bedside chair)?: Total Help needed to walk in hospital room?: Total Help needed climbing 3-5 steps with a railing? : Total 6 Click Score: 6    End of Session Equipment Utilized During Treatment: Gait belt Activity Tolerance: Patient limited by pain Patient left: in chair;with call bell/phone within reach;with chair alarm set;with nursing/sitter in room Nurse Communication: Mobility status;Need for lift equipment PT Visit Diagnosis: Other abnormalities of gait and mobility (R26.89);Repeated falls (R29.6);Pain     Time: [id number] PT Time Calculation (min) (ACUTE ONLY): 23 min  Charges:  $Therapeutic Activity: 23-37 mins                     [place], PT Acute Rehabilitation Services Pager: [phone] Office: [place] 09/10/2019, 1:02 PM

## 2019-09-10 NOTE — Progress Notes (Signed)
Pt's foley has been leaking frequently today causing added MASD to her buttocks area. Received an order to exchange foley. Called SWOT to remove old foley and place new one but pt refused. Pt stated that she would not allow new foley until this nurse gave her more pain medicine. This nurse had already given her 5mg  Oxy PO but pt stated that that wasn't enough. MD modified Oxy med order to 15mg . Will retry later.

## 2019-09-10 NOTE — Progress Notes (Signed)
PROGRESS NOTE  Angelica Bell [id number] DOB: 03/13/1932 DOA: 08/31/2019 PCP: Heywood Bene, PA-C  Brief History   85 year old female with past medical history of prior CVA, hypertension, arthritis, chronic pain most recently discharged 8/8 following acute CVA, dehydration, rhabdomyolysis acute renal failure who was found down at home by the grand son. Patinet was back home from SNF last thursday , was found on floor on Friday morning by grandson. He went to check onTuesday morning (9/1) and found her on floor and he called EMS and she was brought to ER.Apparently fell in bathroom, took over an hour to get her bearings and dragged herself out toward phone.Patient responded very well to narcan and narcan drip. She did not respond to fluid resuscitation in terms of Bps so PCCM asked to admit for hypotension. She also had rhabdo,aki, lactic acidosis, borderline UA, leucocytosis CT head no acute finding. Utox with benzos and opiates. Not sure where benzodiazepines came from but note history of taking too many xanax back in 2012. Per PCCM overall impression here is she is over-medicated at home, fell, and had rhabdo/AKI from prolonged downtime and poor PO. Also has fecal impaction, deconditioning, elevated ammonia and AST/ALT pattern that could be indicative of EtOH use although she denies. Shock seems more hypovolemic given response to fluids. Questionable UTI but I guess will treat with AMS, leukocytosis, and UA, d/c abx if Pct <0.5.Patient's blood pressure improved and she was transferred to [facility] service 9/3. Patient hydrated, AKI, rhabdomyolysis improved Continue to complain of left leg pain CT scan obtained that showed nondisplaced fracture of the left pubic bone. The patient has had complaints of pain in her right breast. She claims that she has breast implants. CXR however demonstrates no evidence of implants or fracture. General surgery was curb sided. They also see no evidence of  implants. Pain will be treated conservatively with oral pain medications.  Consultants   None  Procedures   None  Antibiotics   Anti-infectives (From admission, onward)   Start     Dose/Rate Route Frequency Ordered Stop   09/01/19 0900  ceFEPIme (MAXIPIME) 2 g in sodium chloride 0.9 % 100 mL IVPB     2 g 200 mL/hr over 30 Minutes Intravenous Every 24 hours 08/31/19 0900 09/07/19 1138   08/31/19 0900  vancomycin variable dose per unstable renal function (pharmacist dosing)  Status:  Discontinued      Does not apply See admin instructions 08/31/19 0900 09/01/19 0924   08/31/19 0800  ceFEPIme (MAXIPIME) 2 g in sodium chloride 0.9 % 100 mL IVPB     2 g 200 mL/hr over 30 Minutes Intravenous  Once 08/31/19 0747 08/31/19 0948   08/31/19 0800  metroNIDAZOLE (FLAGYL) IVPB 500 mg     500 mg 100 mL/hr over 60 Minutes Intravenous  Once 08/31/19 0747 08/31/19 1034   08/31/19 0800  vancomycin (VANCOCIN) IVPB 1000 mg/200 mL premix     1,000 mg 200 mL/hr over 60 Minutes Intravenous  Once 08/31/19 0747 08/31/19 1419        Subjective  The patient is nauseated today. She had 6 BM's yesterday and one this morning.   Objective   Vitals:  Vitals:   09/09/19 2223 09/10/19 0521  BP: (!) 157/69 (!) 177/59  Pulse: 88 88  Resp:  17  Temp: 98.5 F (36.9 C) 98.8 F (37.1 C)  SpO2: 95% 96%    Exam:  Constitutional:   The patient is awake, alert, and oriented x 3. No  acute distress. Respiratory:   There is no increased work of breathing.  No wheezes, rales, or rhonchi.  No tactile fremitus. Cardiovascular:   Regular rate and rhythm.  No murmurs, ectopy, or gallups.  No lateral PMI. No thrills. Abdomen:   Abdomen is soft, non-tender, non-distended.  No hernias, masses, or organomegaly.  Normoactive bowel sounds.  Musculoskeletal:   No cyanosis, clubbing or edema Skin:   No rashes, lesions, ulcers  palpation of skin: no induration or nodule Neuro: The patient is  awake, alert, and oriented x 3.  CN II - XII grossly intact. Patient is moving all extremities.  I have personally reviewed the following:   Today's Data   Vitals, BMP  Scheduled Meds:  amLODipine  10 mg Oral Daily   aspirin EC  81 mg Oral Daily   bisacodyl  10 mg Rectal Once   Chlorhexidine Gluconate Cloth  6 each Topical Daily   escitalopram  20 mg Oral Daily   heparin  5,000 Units Subcutaneous Q8H   pantoprazole  40 mg Oral Daily   polyethylene glycol  17 g Oral Daily   potassium chloride  40 mEq Oral Once   senna-docusate  2 tablet Oral BID   Continuous Infusions:  sodium chloride 75 mL/hr at 09/09/19 0303    Active Problems:   ANXIETY DEPRESSION   Chronic pain   Essential hypertension   Abnormal liver function   Shock ([place])   UTI (urinary tract infection)   Rhabdomyolysis   LOS: 10 days   A & P  Acute metabolic encephalopathy/delirium: Resolved. Cause is likely multifactorial secondary to UTI, metabolic abnormalities, and opiates.  Rhabdomyolysis in the setting of fall and prolonged time laying on the floor: Resolving.  CPK 390 on 09/09/2019. Continue IV fluids and to trend CPK.   AKI: Resolving with improvement in rhabdomyolysis and IV fluids. Baseline creatinine is about 0.90. She was admitted with creatinine of 2.41. 1.04 today.  x2 days Continue  n  Left pubic bone fracture post fall: Pain management in place with tramadol 50 mg twice daily and oxycodone 5 mg every 4 hours as needed for severe and breakthrough pain. Continue bowel regimen to avoid opiate-induced constipation. PT OT recommended SNFCSW consulted to assist with SNF placement Continue fall precautions. Awaiting bed placement at SNF.  Right breast injury with questionable breast implants: Patient reports she has had breast implants that are causing her pain. No clear evidence of breast implant on chest imagings. Curb sided with general surgery Dr. Georgiana Spinner who also reviewed chest  imagings. Continue conservative management and pain control.  Uncontrolled hypertension: Partially due to pain. She has also been started on Norvasc 10 mg daily with IV labetalol as needed. Not on blood pressure medications at home.  Hypokalemia: Potassium of 2.8 this morning. Supplement and follow.  Hypomagnesemia: Supplement and monitor.  Pseudomonal UTI: Urine culture from 08/31/19 positive for pseudomonas aeruginosa resistant to ciprofloxacin. The patient has completed 7 days of IV cefepime. Blood cultures x2 drawn on 08/31/2019 final negative.  Pseudo-hypocalcemia: Calcium 7.7, albumin 2.3. Corrected calcium for albumin 9.1.  Improving AKI on CKD 3: Appears to be at her baseline creatinine 1.04 with GFR 49  Continue to avoid nephrotoxins and hypotension. Monitor creatinine, electrolytes, and volume status.  Elevated LFTs, improving: Elevated ALT and AST, levels continue to trend down. Related to rhabdomyolysis. Acute hepatitis panel negative.  Chronic anxiety/depression: Continue escitalopram.  GERD: Continue PPI.  Hyperlipidemia: Continue statin.  Ambulatory dysfunction/physical debility: PT OT assessment recommended  SNF. CSW assisting with SNF placement. Continue fall precautions.  I have seen and examined this patient myself. I have spent 32 minutes in her evaluation and care.  DVT prophylaxis:Heparin subcu 3 times daily Code Status: Full code Family Communication: None at bedside.  Will call grandson. Disposition Plan: Pending bed placement.  Continue to replace electrolytes.  Continue IV fluid hydration for rhabdomyolysis.  Angelica Wilborn, DO [facility] Direct contact: see [email or web address]  7PM-7AM contact night coverage as above 09/10/2019, 2:29 PM  LOS: 9 days

## 2019-09-11 ENCOUNTER — Inpatient Hospital Stay (HOSPITAL_COMMUNITY): Payer: Medicare Other

## 2019-09-11 LAB — BASIC METABOLIC PANEL
Anion gap: 5 (ref 5–15)
BUN: 13 mg/dL (ref 8–23)
CO2: 24 mmol/L (ref 22–32)
Calcium: 8 mg/dL — ABNORMAL LOW (ref 8.9–10.3)
Chloride: 109 mmol/L (ref 98–111)
Creatinine, Ser: 0.96 mg/dL (ref 0.44–1.00)
GFR calc Af Amer: >60 mL/min (ref >60)
GFR calc non Af Amer: 54 mL/min — ABNORMAL LOW (ref >60)
Glucose, Bld: 102 mg/dL — ABNORMAL HIGH (ref 70–99)
Potassium: 3.9 mmol/L (ref 3.5–5.1)
Sodium: 138 mmol/L (ref 135–145)

## 2019-09-11 LAB — CK: Total CK: 132 U/L (ref 38–234)

## 2019-09-11 NOTE — Progress Notes (Signed)
Patient complaints of constant left foot bottom  heel pain. MD. Ava was notify. Pt foot is elevated off the bed, + one pulse noted, skin cool and dry to touch.will continue to monitor.

## 2019-09-11 NOTE — Progress Notes (Signed)
PROGRESS NOTE  Angelica Bell [id number] DOB: 10/12/1932 DOA: 08/31/2019 PCP: Heywood Bene, PA-C  Brief History   85 year old female with past medical history of prior CVA, hypertension, arthritis, chronic pain most recently discharged 8/8 following acute CVA, dehydration, rhabdomyolysis acute renal failure who was found down at home by Angelica grand son. Patinet was back home from SNF last thursday , was found on floor on Friday morning by grandson. He went to check onTuesday morning (9/1) and found her on floor and he called EMS and she was brought to ER.Apparently fell in bathroom, took over an hour to get her bearings and dragged herself out toward phone.Bell responded very well to narcan and narcan drip. She did not respond to fluid resuscitation in terms of Bps so PCCM asked to admit for hypotension. She also had rhabdo,aki, lactic acidosis, borderline UA, leucocytosis CT head no acute finding. Utox with benzos and opiates. Not sure where benzodiazepines came from but note history of taking too many xanax back in 2012. Per PCCM overall impression here is she is over-medicated at home, fell, and had rhabdo/AKI from prolonged downtime and poor PO. Also has fecal impaction, deconditioning, elevated ammonia and AST/ALT pattern that could be indicative of EtOH use although she denies. Shock seems more hypovolemic given response to fluids. Questionable UTI but I guess will treat with AMS, leukocytosis, and UA, d/c abx if Pct <0.5.Bell's blood pressure improved and she was transferred to [facility] service 9/3. Bell hydrated, AKI, rhabdomyolysis improved Continue to complain of left leg pain CT scan obtained that showed nondisplaced fracture of Angelica left pubic bone. Angelica Bell has had complaints of pain in her right breast. She claims that she has breast implants. CXR however demonstrates no evidence of implants or fracture. General surgery was curb sided. They also see no evidence of  implants. Pain will be treated conservatively with oral pain medications. Her oxycodone has been increased to 10 mg from 5mg . She states that she is more comfortable.  Consultants   Orthopedic Surgery  Procedures   None  Antibiotics   Anti-infectives (From admission, onward)   Start     Dose/Rate Route Frequency Ordered Stop   09/01/19 0900  ceFEPIme (MAXIPIME) 2 g in sodium chloride 0.9 % 100 mL IVPB     2 g 200 mL/hr over 30 Minutes Intravenous Every 24 hours 08/31/19 0900 09/07/19 1138   08/31/19 0900  vancomycin variable dose per unstable renal function (pharmacist dosing)  Status:  Discontinued      Does not apply See admin instructions 08/31/19 0900 09/01/19 0924   08/31/19 0800  ceFEPIme (MAXIPIME) 2 g in sodium chloride 0.9 % 100 mL IVPB     2 g 200 mL/hr over 30 Minutes Intravenous  Once 08/31/19 0747 08/31/19 0948   08/31/19 0800  metroNIDAZOLE (FLAGYL) IVPB 500 mg     500 mg 100 mL/hr over 60 Minutes Intravenous  Once 08/31/19 0747 08/31/19 1034   08/31/19 0800  vancomycin (VANCOCIN) IVPB 1000 mg/200 mL premix     1,000 mg 200 mL/hr over 60 Minutes Intravenous  Once 08/31/19 0747 08/31/19 1419        Subjective  Angelica Bell is resting comfortably. No new complaints.  Objective   Vitals:  Vitals:   09/11/19 0942 09/11/19 1214  BP: 129/68 (!) 125/56  Pulse: 82 78  Resp:  15  Temp:  98.6 F (37 C)  SpO2: 94% 95%    Exam:  Constitutional:   Angelica Bell is  awake, alert, and oriented x 3. No acute distress. Respiratory:   There is no increased work of breathing.  No wheezes, rales, or rhonchi.  No tactile fremitus. Cardiovascular:   Regular rate and rhythm.  No murmurs, ectopy, or gallups.  No lateral PMI. No thrills. Abdomen:   Abdomen is soft, non-tender, non-distended.  No hernias, masses, or organomegaly.  Normoactive bowel sounds.  Musculoskeletal:   No cyanosis, clubbing or edema Skin:   No rashes, lesions, ulcers  palpation  of skin: no induration or nodule Neuro: Angelica Bell is awake, alert, and oriented x 3.  CN II - XII grossly intact. Bell is moving all extremities.  I have personally reviewed Angelica following:   Today's Data   Vitals, BMP  Scheduled Meds:  amLODipine  10 mg Oral Daily   aspirin EC  81 mg Oral Daily   bisacodyl  10 mg Rectal Once   Chlorhexidine Gluconate Cloth  6 each Topical Daily   escitalopram  20 mg Oral Daily   heparin  5,000 Units Subcutaneous Q8H   pantoprazole  40 mg Oral Daily   polyethylene glycol  17 g Oral Daily   potassium chloride  40 mEq Oral Once   potassium chloride  40 mEq Oral Once   senna-docusate  2 tablet Oral BID   Continuous Infusions:  sodium chloride 75 mL/hr at 09/11/19 1237    Active Problems:   ANXIETY DEPRESSION   Chronic pain   Essential hypertension   Abnormal liver function   Shock ([place])   UTI (urinary tract infection)   Rhabdomyolysis   LOS: 11 days   A & P  Acute metabolic encephalopathy/delirium: Resolved. Cause is likely multifactorial secondary to UTI, metabolic abnormalities, and opiates.  Rhabdomyolysis in Angelica setting of fall and prolonged time laying on Angelica floor: Resolving.  CPK 390 on 09/09/2019. Continue IV fluids and to trend CPK.   AKI: Resolving with improvement in rhabdomyolysis and IV fluids. Baseline creatinine is about 0.90. She was admitted with creatinine of 2.41. 1.04 today.  Left pubic bone fracture post fall: Pain management in place with tramadol 50 mg twice daily and oxycodone 5 mg every 4 hours as needed for severe and breakthrough pain. Continue bowel regimen to avoid opiate-induced constipation. PT OT recommended SNF. CSW consulted to assist with SNF placement. Pain control with Oxycodone increased to 10 mg.   Continue fall precautions. Awaiting bed placement at SNF.  Right breast injury with questionable breast implants: Bell reports she has had breast implants that are causing her pain.  No clear evidence of breast implant on chest imagings. Curb sided with general surgery Dr. Georgiana Spinner who also reviewed chest imagings. Continue conservative management and pain control.  Uncontrolled hypertension: Partially due to pain. She has also been started on Norvasc 10 mg daily with IV labetalol as needed. Not on blood pressure medications at home.  Hypokalemia: Resolved. Monitor and supplement as necessary.  Hypomagnesemia: Supplement and monitor.  Pseudomonal UTI: Urine culture from 08/31/19 positive for pseudomonas aeruginosa resistant to ciprofloxacin. Angelica Bell has completed 7 days of IV cefepime. Blood cultures x2 drawn on 08/31/2019 final negative.  Pseudo-hypocalcemia: Calcium 7.7, albumin 2.3. Corrected calcium for albumin 9.1.  Improving AKI on CKD 3: Appears to be at her baseline creatinine 1.04 with GFR 49  Continue to avoid nephrotoxins and hypotension. Monitor creatinine, electrolytes, and volume status.  Elevated LFTs, improving: Elevated ALT and AST, levels continue to trend down. Related to rhabdomyolysis. Acute hepatitis panel negative.  Chronic  anxiety/depression: Continue escitalopram.  GERD: Continue PPI.  Hyperlipidemia: Continue statin.  Ambulatory dysfunction/physical debility: PT OT assessment recommended SNF. CSW assisting with SNF placement. Continue fall precautions.  I have seen and examined this Bell myself. I have spent 30 minutes in her evaluation and care.  DVT prophylaxis:Heparin subcu 3 times daily Code Status: Full code Family Communication: None at bedside.  Will call grandson. Disposition Plan: Pending bed placement.  Continue to replace electrolytes.  Continue IV fluid hydration for rhabdomyolysis.  Tod Abrahamsen, DO [facility] Direct contact: see [email or web address]  7PM-7AM contact night coverage as above 09/11/2019, 5:01 PM  LOS: 9 days

## 2019-09-11 NOTE — TOC Progression Note (Signed)
Transition of Care [facility]) - Progression Note    Patient Details  Name: Angelica Bell MRN: [id number] Date of Birth: Mar 22, 1932  Transition of Care [facility]) CM/SW [place], LCSW Phone Number: 09/11/2019, 11:46 AM  Clinical Narrative:    Patient being considered by [facility] and [facility] pending bed availability and Medicaid application. CSW will follow up with financial counseling.    Expected Discharge Plan: Skilled Nursing Facility Barriers to Discharge: Ship broker, Continued Medical Work up  Expected Discharge Plan and Services Expected Discharge Plan: [place] In-house Referral: Clinical Social Work Discharge Planning Services: NA Post Acute Care Choice: [place] Living arrangements for the past 2 months: [place], Single Family Home                 DME Arranged: N/A DME Agency: NA       HH Arranged: NA HH Agency: NA         Social Determinants of Health (SDOH) Interventions    Readmission Risk Interventions No flowsheet data found.

## 2019-09-11 NOTE — NC FL2 (Signed)
[place] MEDICAID FL2 LEVEL OF CARE SCREENING TOOL     IDENTIFICATION  Patient Name: Angelica Bell Birthdate: Apr 25, 1932 Sex: female Admission Date (Current Location): 08/31/2019  [facility] and [place] Number:  Herbalist and Address:  The . [facility], [place] [address] [place], [place] [address]      Provider Number: [id number]  Attending Physician Name and Address:  Karie Kirks, DO  Relative Name and Phone Number:  Marchia Bond [phone]    Current Level of Care: Hospital Recommended Level of Care: [place] Prior Approval Number:    Date Approved/Denied:   PASRR Number: [id number]  Discharge Plan: SNF    Current Diagnoses: Patient Active Problem List   Diagnosis Date Noted  . UTI (urinary tract infection) 09/01/2019  . Rhabdomyolysis 09/01/2019  . Shock ([place]) 08/31/2019  . Acute cystitis 08/03/2019  . Confusion and disorientation 08/03/2019  . Cerebrovascular accident (CVA) ([place])   . ARF (acute renal failure) ([place]) 07/26/2019  . Hyponatremia 07/26/2019  . Abnormal liver function 07/26/2019  . Peripheral neuropathy   . Drug overdose   . Polypharmacy   . Altered mental status 10/25/2017  . Closed displaced intertrochanteric fracture of left femur ([place]) 02/16/2017  . Hip fracture, right ([place]) 12/25/2015  . Chronic pain 12/24/2015  . Fracture of femoral neck, right, closed ([place]) 12/24/2015  . Essential hypertension 12/24/2015  . Community acquired pneumonia 04/15/2013  . Hypokalemia 04/15/2013  . Labial cyst 02/08/2012  . POSTMENOPAUSAL SYNDROME 11/28/2009  . PERIPHERAL NEUROPATHY, LOWER EXTREMITY, RIGHT 02/22/2009  . INSOMNIA-SLEEP DISORDER-UNSPEC 10/12/2008  . Lumbago 08/02/2008  . 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 ANXIETY DEPRESSION 12/16/2007  . HYPERLIPIDEMIA 07/08/2007  . Osteoarthritis 07/08/2007  . Osteoporosis 07/08/2007  . SCOLIOSIS NEC 07/08/2007    Orientation RESPIRATION  BLADDER Height & Weight     Self, Place, Time  Normal Incontinent, Indwelling catheter Weight: 134 lb (60.8 kg) Height:  5\' 6"  (167.6 cm)  BEHAVIORAL SYMPTOMS/MOOD NEUROLOGICAL BOWEL NUTRITION STATUS      Continent Diet(Please see DC Summary)  AMBULATORY STATUS COMMUNICATION OF NEEDS Skin   Extensive Assist Verbally Normal                       Personal Care Assistance Level of Assistance  Bathing, Feeding, Dressing Bathing Assistance: Maximum assistance Feeding assistance: Limited assistance Dressing Assistance: Maximum assistance     Functional Limitations Info  Sight, Hearing, Speech Sight Info: Adequate Hearing Info: Adequate Speech Info: Adequate    SPECIAL CARE FACTORS FREQUENCY  PT (By licensed PT), OT (By licensed OT)     PT Frequency: 5x/week OT Frequency: 3x/week            Contractures Contractures Info: Not present    Additional Factors Info  Code Status, Allergies Code Status Info: Full Allergies Info: Hctz (Hydrochlorothiazide), Amoxicillin-pot Clavulanate, Lisinopril-hydrochlorothiazide, Sulfa Antibiotics, Sulfonamide Derivatives           Current Medications (09/11/2019):  This is the current hospital active medication list Current Facility-Administered Medications  Medication Dose Route Frequency Provider Last Rate Last Dose  . 0.9 %  sodium chloride infusion   Intravenous Continuous Antonieta Pert, MD 75 mL/hr at 09/10/19 2208    . amLODipine (NORVASC) tablet 10 mg  10 mg Oral Daily Irene Pap N, DO   10 mg at 09/10/19 0915  . aspirin EC tablet 81 mg  81 mg Oral Daily Kc, Ramesh, MD   81 mg at 09/10/19 0915  .  bisacodyl (DULCOLAX) suppository 10 mg  10 mg Rectal Once Candee Furbish, MD      . Chlorhexidine Gluconate Cloth 2 % PADS 6 each  6 each Topical Daily Candee Furbish, MD   6 each at 09/10/19 1038  . escitalopram (LEXAPRO) tablet 20 mg  20 mg Oral Daily Kc, Ramesh, MD   20 mg at 09/10/19 0916  . heparin injection 5,000 Units  5,000  Units Subcutaneous Q8H Candee Furbish, MD   5,000 Units at 09/11/19 0615  . ipratropium-albuterol (DUONEB) 0.5-2.5 (3) MG/3ML nebulizer solution 3 mL  3 mL Nebulization Q6H PRN Candee Furbish, MD      . labetalol (NORMODYNE) injection 5 mg  5 mg Intravenous Q4H PRN Irene Pap N, DO   5 mg at 09/08/19 1825  . lip balm (CARMEX) ointment   Topical PRN Candee Furbish, MD      . ondansetron [facility]) injection 4 mg  4 mg Intravenous Q6H PRN Candee Furbish, MD   4 mg at 09/10/19 1042  . oxyCODONE (Oxy IR/ROXICODONE) immediate release tablet 15 mg  15 mg Oral Q4H PRN Swayze, Ava, DO   15 mg at 09/11/19 0615  . pantoprazole (PROTONIX) EC tablet 40 mg  40 mg Oral Daily Candee Furbish, MD   40 mg at 09/10/19 0916  . polyethylene glycol (MIRALAX / GLYCOLAX) packet 17 g  17 g Oral Daily Irene Pap N, DO   17 g at 09/08/19 0847  . potassium chloride 20 MEQ/15ML (10%) solution 40 mEq  40 mEq Oral Once Swayze, Ava, DO      . potassium chloride SA (K-DUR) CR tablet 40 mEq  40 mEq Oral Once Swayze, Ava, DO      . senna-docusate (Senokot-S) tablet 2 tablet  2 tablet Oral BID Irene Pap N, DO   2 tablet at 09/09/19 [id number]     Discharge Medications: Please see discharge summary for a list of discharge medications.  Relevant Imaging Results:  Relevant Lab Results:   Additional Information SSN: [id number]   COVID negative on 9/1  [place], LCSW

## 2019-09-12 LAB — CK: Total CK: 96 U/L (ref 38–234)

## 2019-09-12 NOTE — Progress Notes (Signed)
PROGRESS NOTE  Angelica Bell [id number] DOB: 1932/09/28 DOA: 08/31/2019 PCP: Heywood Bene, PA-C  Brief History   85 year old female with past medical history of prior CVA, hypertension, arthritis, chronic pain most recently discharged 8/8 following acute CVA, dehydration, rhabdomyolysis acute renal failure who was found down at home by the grand son. Patinet was back home from SNF last thursday , was found on floor on Friday morning by grandson. He went to check onTuesday morning (9/1) and found her on floor and he called EMS and she was brought to ER.Apparently fell in bathroom, took over an hour to get her bearings and dragged herself out toward phone.Patient responded very well to narcan and narcan drip. She did not respond to fluid resuscitation in terms of Bps so PCCM asked to admit for hypotension. She also had rhabdo,aki, lactic acidosis, borderline UA, leucocytosis. CT head no acute finding. Utox with benzos and opiates. Not sure where benzodiazepines came from but note history of taking too many xanax back in 2012. Per PCCM overall impression here is she is over-medicated at home, fell, and had rhabdo/AKI from prolonged downtime and poor PO. Also has fecal impaction, deconditioning, elevated ammonia and AST/ALT pattern that could be indicative of EtOH use although she denies. Shock seems more hypovolemic given response to fluids. Questionable UTI but I guess will treat with AMS, leukocytosis, and UA, d/c abx if Pct <0.5.Patient's blood pressure improved and she was transferred to [facility] service 9/3. Patient hydrated, AKI, rhabdomyolysis improved Continue to complain of left leg pain CT scan obtained that showed nondisplaced fracture of the left pubic bone. The patient has had complaints of pain in her right breast. She claims that she has breast implants. CXR however demonstrates no evidence of implants or fracture. General surgery was curb sided. They also see no evidence of  implants. Pain will be treated conservatively with oral pain medications. Her oxycodone has been increased to 10 mg from 5mg . She states that she is more comfortable.  Consultants  . Orthopedic Surgery  Procedures  . None  Antibiotics   Anti-infectives (From admission, onward)   Start     Dose/Rate Route Frequency Ordered Stop   09/01/19 0900  ceFEPIme (MAXIPIME) 2 g in sodium chloride 0.9 % 100 mL IVPB     2 g 200 mL/hr over 30 Minutes Intravenous Every 24 hours 08/31/19 0900 09/07/19 1138   08/31/19 0900  vancomycin variable dose per unstable renal function (pharmacist dosing)  Status:  Discontinued      Does not apply See admin instructions 08/31/19 0900 09/01/19 0924   08/31/19 0800  ceFEPIme (MAXIPIME) 2 g in sodium chloride 0.9 % 100 mL IVPB     2 g 200 mL/hr over 30 Minutes Intravenous  Once 08/31/19 0747 08/31/19 0948   08/31/19 0800  metroNIDAZOLE (FLAGYL) IVPB 500 mg     500 mg 100 mL/hr over 60 Minutes Intravenous  Once 08/31/19 0747 08/31/19 1034   08/31/19 0800  vancomycin (VANCOCIN) IVPB 1000 mg/200 mL premix     1,000 mg 200 mL/hr over 60 Minutes Intravenous  Once 08/31/19 0747 08/31/19 1419     .   Subjective  The patient is resting comfortably. No new complaints.  Objective   Vitals:  Vitals:   09/11/19 2104 09/12/19 0519  BP: 123/61 140/62  Pulse: 75 83  Resp: 16 14  Temp: 98.3 F (36.8 C) 98.6 F (37 C)  SpO2: 93% 97%    Exam:  Constitutional:  . The  patient is awake, alert, and oriented x 3. No acute distress. Respiratory:  . There is no increased work of breathing. . No wheezes, rales, or rhonchi. . No tactile fremitus. Cardiovascular:  . Regular rate and rhythm. . No murmurs, ectopy, or gallups. . No lateral PMI. No thrills. Abdomen:  . Abdomen is soft, non-tender, non-distended. . No hernias, masses, or organomegaly. . Normoactive bowel sounds.  Musculoskeletal:  . No cyanosis, clubbing or edema . No lesions, bruising or ulcers  on feet/heels bilaterally. Skin:  . No rashes, lesions, ulcers . palpation of skin: no induration or nodule Neuro: The patient is awake, alert, and oriented x 3.  CN II - XII grossly intact. Patient is moving all extremities.  I have personally reviewed the following:   Today's Data  . Vitals, BMP  Scheduled Meds: . amLODipine  10 mg Oral Daily  . aspirin EC  81 mg Oral Daily  . bisacodyl  10 mg Rectal Once  . Chlorhexidine Gluconate Cloth  6 each Topical Daily  . escitalopram  20 mg Oral Daily  . heparin  5,000 Units Subcutaneous Q8H  . pantoprazole  40 mg Oral Daily  . polyethylene glycol  17 g Oral Daily  . potassium chloride  40 mEq Oral Once  . potassium chloride  40 mEq Oral Once  . senna-docusate  2 tablet Oral BID   Continuous Infusions: . sodium chloride 75 mL/hr at 09/12/19 0300    Active Problems:   ANXIETY DEPRESSION   Chronic pain   Essential hypertension   Abnormal liver function   Shock ([place])   UTI (urinary tract infection)   Rhabdomyolysis   LOS: 12 days   A & P  Acute metabolic encephalopathy/delirium: Resolved. Cause is likely multifactorial secondary to UTI, metabolic abnormalities, and opiates.  Rhabdomyolysis in the setting of fall and prolonged time laying on the floor: Resolving.  CPK 390 on 09/09/2019. Continue IV fluids and to trend CPK.   Left Heel pain: X-rays were negative for fracture or other acute abnormality. Have asked nursing to float the heels.   AKI: Resolving with improvement in rhabdomyolysis and IV fluids. Baseline creatinine is about 0.90. She was admitted with creatinine of 2.41. 0.96 today.  Left pubic bone fracture post fall: Pain management in place with tramadol 50 mg twice daily and oxycodone 5 mg every 4 hours as needed for severe and breakthrough pain. Continue bowel regimen to avoid opiate-induced constipation. PT OT recommended SNF. CSW consulted to assist with SNF placement. Pain control with Oxycodone increased to  10 mg.   Continue fall precautions. Awaiting bed placement at SNF.  Right breast injury with questionable breast implants: Patient reports she has had breast implants that are causing her pain. No clear evidence of breast implant on chest imagings. Curb sided with general surgery Dr. Georgiana Spinner who also reviewed chest imagings. Continue conservative management and pain control.  Uncontrolled hypertension: Partially due to pain. She has also been started on Norvasc 10 mg daily with IV labetalol as needed. Not on blood pressure medications at home.  Hypokalemia: Resolved. Monitor and supplement as necessary.  Hypomagnesemia: Supplement and monitor.  Pseudomonal UTI: Urine culture from 08/31/19 positive for pseudomonas aeruginosa resistant to ciprofloxacin. The patient has completed 7 days of IV cefepime. Blood cultures x2 drawn on 08/31/2019 final negative.  Pseudo-hypocalcemia: Calcium 7.7, albumin 2.3. Corrected calcium for albumin 9.1.  Improving AKI on CKD 3: Appears to be at her baseline creatinine 0.96 with GFR  54 Continue to  avoid nephrotoxins and hypotension. Monitor creatinine, electrolytes, and volume status.  Elevated LFTs, improving: Elevated ALT and AST, levels continue to trend down. Related to rhabdomyolysis. Acute hepatitis panel negative.  Chronic anxiety/depression: Continue escitalopram.  GERD: Continue PPI.  Hyperlipidemia: Continue statin.  Ambulatory dysfunction/physical debility: PT OT assessment recommended SNF. CSW assisting with SNF placement. Continue fall precautions.  I have seen and examined this patient myself. I have spent 34 minutes in her evaluation and care.  DVT prophylaxis:Heparin subcu 3 times daily Code Status: Full code Family Communication: None at bedside.  Will call grandson. Disposition Plan: Pending bed placement.  Continue to replace electrolytes.  Continue IV fluid hydration for rhabdomyolysis.  Taiga Lupinacci, DO [facility] Direct contact: see [email or web address]  7PM-7AM contact night coverage as above 09/12/2019, 3:31 PM  LOS: 9 days

## 2019-09-12 NOTE — Progress Notes (Signed)
Patient is refusing COVID test at this. She is aware of the importance of the test for SNF placement. She says she will allow me to do the test in the morning. I will re-attempt to do the test around 6AM.

## 2019-09-13 ENCOUNTER — Telehealth: Payer: Self-pay

## 2019-09-13 LAB — CK: Total CK: 91 U/L (ref 38–234)

## 2019-09-13 NOTE — Progress Notes (Signed)
PROGRESS NOTE  Angelica Bell [id number] DOB: 1932-10-14 DOA: 08/31/2019 PCP: Heywood Bene, PA-C  Brief History   85 year old female with past medical history of prior CVA, hypertension, arthritis, chronic pain most recently discharged 8/8 following acute CVA, dehydration, rhabdomyolysis acute renal failure who was found down at home by the grand son. Patinet was back home from SNF last thursday , was found on floor on Friday morning by grandson. He went to check onTuesday morning (9/1) and found her on floor and he called EMS and she was brought to ER.Apparently fell in bathroom, took over an hour to get her bearings and dragged herself out toward phone.Patient responded very well to narcan and narcan drip. She did not respond to fluid resuscitation in terms of Bps so PCCM asked to admit for hypotension. She also had rhabdo,aki, lactic acidosis, borderline UA, leucocytosis. CT head no acute finding. Utox with benzos and opiates. Not sure where benzodiazepines came from but note history of taking too many xanax back in 2012. Per PCCM overall impression here is she is over-medicated at home, fell, and had rhabdo/AKI from prolonged downtime and poor PO. Also has fecal impaction, deconditioning, elevated ammonia and AST/ALT pattern that could be indicative of EtOH use although she denies. Shock seems more hypovolemic given response to fluids. Questionable UTI but I guess will treat with AMS, leukocytosis, and UA, d/c abx if Pct <0.5.Patient's blood pressure improved and she was transferred to [facility] service 9/3. Patient hydrated, AKI, rhabdomyolysis improved Continue to complain of left leg pain CT scan obtained that showed nondisplaced fracture of the left pubic bone. The patient has had complaints of pain in her right breast. She claims that she has breast implants. CXR however demonstrates no evidence of implants or fracture. General surgery was curb sided. They also see no evidence of  implants. Pain will be treated conservatively with oral pain medications. Her oxycodone has been increased to 10 mg from 5mg . She states that she is more comfortable.  Repeat COVID attempted for SNF placement. Pt has refused this morning.  Consultants  . Orthopedic Surgery  Procedures  . None  Antibiotics   Anti-infectives (From admission, onward)   Start     Dose/Rate Route Frequency Ordered Stop   09/01/19 0900  ceFEPIme (MAXIPIME) 2 g in sodium chloride 0.9 % 100 mL IVPB     2 g 200 mL/hr over 30 Minutes Intravenous Every 24 hours 08/31/19 0900 09/07/19 1138   08/31/19 0900  vancomycin variable dose per unstable renal function (pharmacist dosing)  Status:  Discontinued      Does not apply See admin instructions 08/31/19 0900 09/01/19 0924   08/31/19 0800  ceFEPIme (MAXIPIME) 2 g in sodium chloride 0.9 % 100 mL IVPB     2 g 200 mL/hr over 30 Minutes Intravenous  Once 08/31/19 0747 08/31/19 0948   08/31/19 0800  metroNIDAZOLE (FLAGYL) IVPB 500 mg     500 mg 100 mL/hr over 60 Minutes Intravenous  Once 08/31/19 0747 08/31/19 1034   08/31/19 0800  vancomycin (VANCOCIN) IVPB 1000 mg/200 mL premix     1,000 mg 200 mL/hr over 60 Minutes Intravenous  Once 08/31/19 0747 08/31/19 1419     .   Subjective  The patient is resting comfortably. No new complaints.  Objective   Vitals:  Vitals:   09/13/19 0405 09/13/19 0524  BP:  (!) 158/77  Pulse:  87  Resp: 16 17  Temp:  97.9 F (36.6 C)  SpO2:  97%    Exam:  Constitutional:  . The patient is awake, alert, and oriented x 3. No acute distress. Respiratory:  . There is no increased work of breathing. . No wheezes, rales, or rhonchi. . No tactile fremitus. Cardiovascular:  . Regular rate and rhythm. . No murmurs, ectopy, or gallups. . No lateral PMI. No thrills. Abdomen:  . Abdomen is soft, non-tender, non-distended. . No hernias, masses, or organomegaly. . Normoactive bowel sounds.  Musculoskeletal:  . No cyanosis,  clubbing or edema . No lesions, bruising or ulcers on feet/heels bilaterally. Skin:  . No rashes, lesions, ulcers . palpation of skin: no induration or nodule  Neuro: The patient is awake, alert, and oriented x 3.  CN II - XII grossly intact. Patient is moving all extremities.  I have personally reviewed the following:   Today's Data  . Vitals, BMP  Scheduled Meds: . amLODipine  10 mg Oral Daily  . aspirin EC  81 mg Oral Daily  . bisacodyl  10 mg Rectal Once  . Chlorhexidine Gluconate Cloth  6 each Topical Daily  . escitalopram  20 mg Oral Daily  . heparin  5,000 Units Subcutaneous Q8H  . pantoprazole  40 mg Oral Daily  . polyethylene glycol  17 g Oral Daily  . potassium chloride  40 mEq Oral Once  . potassium chloride  40 mEq Oral Once  . senna-docusate  2 tablet Oral BID   Continuous Infusions: . sodium chloride 75 mL/hr at 09/13/19 0606    Active Problems:   ANXIETY DEPRESSION   Chronic pain   Essential hypertension   Abnormal liver function   Shock ([place])   UTI (urinary tract infection)   Rhabdomyolysis   LOS: 13 days   A & P  Acute metabolic encephalopathy/delirium: Resolved. Cause is likely multifactorial secondary to UTI, metabolic abnormalities, and opiates.  Rhabdomyolysis in the setting of fall and prolonged time laying on the floor: Resolving.  CPK 390 on 09/09/2019. Continue IV fluids and to trend CPK.   Left Heel pain: X-rays were negative for fracture or other acute abnormality. Have asked nursing to float the heels.   AKI: Resolving with improvement in rhabdomyolysis and IV fluids. Baseline creatinine is about 0.90. She was admitted with creatinine of 2.41. 0.96 on 09/11/2019.  Left pubic bone fracture post fall: Pain management in place with tramadol 50 mg twice daily and oxycodone 5 mg every 4 hours as needed for severe and breakthrough pain. Continue bowel regimen to avoid opiate-induced constipation. PT OT recommended SNF. CSW consulted to assist  with SNF placement. Pain control with Oxycodone increased to 10 mg.   Continue fall precautions. Awaiting bed placement at SNF.  Right breast injury with questionable breast implants: Patient reports she has had breast implants that are causing her pain. No clear evidence of breast implant on chest imagings. Curb sided with general surgery Dr. Georgiana Spinner who also reviewed chest imagings. Continue conservative management and pain control.  Uncontrolled hypertension: Partially due to pain. She has also been started on Norvasc 10 mg daily with IV labetalol as needed. Not on blood pressure medications at home.  Hypokalemia: Resolved. Monitor and supplement as necessary.  Hypomagnesemia: Supplement and monitor.  Pseudomonal UTI: Urine culture from 08/31/19 positive for pseudomonas aeruginosa resistant to ciprofloxacin. The patient has completed 7 days of IV cefepime. Blood cultures x2 drawn on 08/31/2019 final negative.  Pseudo-hypocalcemia: Calcium 7.7, albumin 2.3. Corrected calcium for albumin 9.1.  Improving AKI on CKD 3: Appears to  be at her baseline creatinine 0.96 with GFR  54 Continue to avoid nephrotoxins and hypotension. Monitor creatinine, electrolytes, and volume status.  Elevated LFTs, improving: Elevated ALT and AST, levels continue to trend down. Related to rhabdomyolysis. Acute hepatitis panel negative.  Chronic anxiety/depression: Continue escitalopram.  GERD: Continue PPI.  Hyperlipidemia: Continue statin.  Ambulatory dysfunction/physical debility: PT OT assessment recommended SNF. CSW assisting with SNF placement. Continue fall precautions.  I have seen and examined this patient myself. I have spent 32 minutes in her evaluation and care.  DVT prophylaxis:Heparin subcu 3 times daily Code Status: Full code Family Communication: None at bedside.  Will call grandson. Disposition Plan: Pending bed placement.  Continue to replace electrolytes.  Continue IV fluid  hydration for rhabdomyolysis.  Gwenn Teodoro, DO [facility] Direct contact: see [email or web address]  7PM-7AM contact night coverage as above 09/13/2019, 3:22 PM  LOS: 9 days

## 2019-09-13 NOTE — Progress Notes (Signed)
On my second attempt to do COVID-19 test on patient, she is moaning and saying that she is not going to do it until "after breakfast". I encouraged her that the longer it takes to do the test, the longer it takes for the test to be read, and this could possibly hold up her SNF placement, as she cannot be placed until the test is completed and resulted. Patient verbalizes understanding and continues to moan, saying, "I need some time to think about it!" I will work with her to get her breakfast ordered ASAP so we can move forward with the test.

## 2019-09-13 NOTE — TOC Progression Note (Signed)
Transition of Care [facility]) - Progression Note    Patient Details  Name: Angelica Bell MRN: [id number] Date of Birth: September 13, 1932  Transition of Care [facility]) CM/SW [place], LCSW Phone Number: 09/13/2019, 10:52 AM  Clinical Narrative:    Angelica Bell not able to accept patient until a Medicaid application has been submitted.   CSW spoke with patient's grandson Angelica Bell [facility]). He reported that he will try to complete the application tonight after work. CSW emailed him the Medicaid application link. In the meantime, CSW will try to get insurance approval for SNF.    Expected Discharge Plan: Skilled Nursing Facility Barriers to Discharge: Ship broker, Continued Medical Work up  Expected Discharge Plan and Services Expected Discharge Plan: [place] In-house Referral: Clinical Social Work Discharge Planning Services: NA Post Acute Care Choice: [place] Living arrangements for the past 2 months: [place], Single Family Home                 DME Arranged: N/A DME Agency: NA       HH Arranged: NA HH Agency: NA         Social Determinants of Health (SDOH) Interventions    Readmission Risk Interventions No flowsheet data found.

## 2019-09-13 NOTE — Telephone Encounter (Signed)
Unable to get in contact with the patient to r/s her appt. Appt has been cancelled. Office number was provided on her voicemail.

## 2019-09-13 NOTE — Progress Notes (Signed)
Occupational Therapy Treatment Patient Details Name: Angelica Bell MRN: [id number] DOB: Feb 02, 1932 Today's Date: 09/13/2019    History of present illness Pt adm after fall and found on floor at home. Pt with acute toxic/metabolic encephalopathy, UTI, rhabdo. Pt also found to have lt pubic and acetabular fx. PMH - RA, rt THR, chronic back pain, PE, scoliosis, osteoporosis, HTN, recent CVA   OT comments  Seen with PT due to pain being an ongoing problem, and activity tolerance. Premedicated for pain however Pt still adverse and needed to be encouraged to participate in therapy; +2 Max to total assist for all aspects of bed mobility; Sat EOB for approx 4-5 minutes and Pt was able to complete minimal grooming tasks with encouragement. OT also washed back which pleased Pt, very painful LLE;  Pt declining OOB to chair or attempts to transfer/stand today "I am NOT doing it, I just can't" Gently eased bed into semi-chair position; continue to recommend SNF for post-acute rehab, and for continuing efforts in encouraging more independence with transfers and ADLs.    Follow Up Recommendations  SNF;Supervision/Assistance - 24 hour    Equipment Recommendations  Other (comment)(defer to next venue of care)    Recommendations for Other Services      Precautions / Restrictions Precautions Precautions: Fall Precaution Comments: also HOH and dementia Restrictions Weight Bearing Restrictions: Yes LLE Weight Bearing: Weight bearing as tolerated Other Position/Activity Restrictions: Pt educated on her WBAT status.  Pts right leg IR is baseline       Mobility Bed Mobility Overal bed mobility: Needs Assistance Bed Mobility: Supine to Sit;Sit to Supine     Supine to sit: Max assist;+2 for physical assistance;HOB elevated Sit to supine: Max assist;+2 for physical assistance;+2 for safety/equipment   General bed mobility comments: max assist to move bil LE and trunk, pt with very limited assist  through RUE to attempt to assist with rising from surface, use of pad to pivot to EOB  Transfers                 General transfer comment: Refusing OOB transfer    Balance Overall balance assessment: Needs assistance Sitting-balance support: Bilateral upper extremity supported;Feet unsupported Sitting balance-Leahy Scale: Fair Sitting balance - Comments: Sat EOB with bil UE support and min to minguard assist; lots of encouragement to stay sitting                                    ADL either performed or assessed with clinical judgement   ADL Overall ADL's : Needs assistance/impaired   Eating/Feeding Details (indicate cue type and reason): left in chair position at bed level to provide best position for patient for lunch Grooming: Min guard;Sitting   Upper Body Bathing: Maximal assistance;Sitting Upper Body Bathing Details (indicate cue type and reason): EOB             Toilet Transfer: Total assistance;+2 for safety/equipment;+2 for physical assistance Toilet Transfer Details (indicate cue type and reason): Pt refusing any transfer, toilet at bed level in bed pan/cath Toileting- Clothing Manipulation and Hygiene: Maximal assistance;+2 for physical assistance;+2 for safety/equipment;Bed level       Functional mobility during ADLs: Maximal assistance;+2 for physical assistance;+2 for safety/equipment General ADL Comments: Pt very limited by pain; +2 maxA for stand pivot transfers; set-upA for light grooming     Vision       Perception  Praxis      Cognition Arousal/Alertness: Awake/alert Behavior During Therapy: WFL for tasks assessed/performed(Ramps into anxiety with every movement bout) Overall Cognitive Status: No family/caregiver present to determine baseline cognitive functioning Area of Impairment: Memory;Following commands;Safety/judgement                   Current Attention Level: Sustained Memory: (with history of  dementia)                  Exercises     Shoulder Instructions       General Comments      Pertinent Vitals/ Pain       Pain Assessment: Faces Faces Pain Scale: Hurts whole lot Pain Location: L thigh, pelvis Pain Descriptors / Indicators: Grimacing;Guarding;Aching Pain Intervention(s): Monitored during session;Premedicated before session;Repositioned  Home Living                                          Prior Functioning/Environment              Frequency  Min 2X/week        Progress Toward Goals  OT Goals(current goals can now be found in the care plan section)  Progress towards OT goals: Not progressing toward goals - comment(limited by pain)  Acute Rehab OT Goals Patient Stated Goal: Did not state, but seems to be happy with the notion of discharging from the hospital OT Goal Formulation: With patient Time For Goal Achievement: 09/17/19 Potential to Achieve Goals: [place] Discharge plan remains appropriate    Co-evaluation    PT/OT/SLP Co-Evaluation/Treatment: Yes Reason for Co-Treatment: Necessary to address cognition/behavior during functional activity;To address functional/ADL transfers PT goals addressed during session: Mobility/safety with mobility OT goals addressed during session: ADL's and self-care      AM-PAC OT "6 Clicks" Daily Activity     Outcome Measure   Help from another person eating meals?: None Help from another person taking care of personal grooming?: A Little Help from another person toileting, which includes using toliet, bedpan, or urinal?: Total Help from another person bathing (including washing, rinsing, drying)?: Total Help from another person to put on and taking off regular upper body clothing?: Total Help from another person to put on and taking off regular lower body clothing?: Total 6 Click Score: 11    End of Session    OT Visit Diagnosis: Pain;Unsteadiness on feet (R26.81);Other  abnormalities of gait and mobility (R26.89);Other symptoms and signs involving cognitive function;Repeated falls (R29.6) Pain - Right/Left: Left Pain - part of body: Leg   Activity Tolerance Patient limited by pain   Patient Left in bed;with call bell/phone within reach;with bed alarm set   Nurse Communication Mobility status(in chair position)        Time: [id number] OT Time Calculation (min): 34 min  Charges: OT General Charges $OT Visit: 1 Visit OT Treatments $Therapeutic Activity: 8-22 mins  Hulda Humphrey OTR/L Acute Rehabilitation Services Pager: [phone] Office: [place] 09/13/2019, 2:27 PM

## 2019-09-13 NOTE — Progress Notes (Signed)
Physical Therapy Treatment Patient Details Name: Angelica Bell MRN: [id number] DOB: [birth year] Today's Date: 09/13/2019    History of Present Illness Pt adm after fall and found on floor at home. Pt with acute toxic/metabolic encephalopathy, UTI, rhabdo. Pt also found to have lt pubic and acetabular fx. PMH - RA, rt THR, chronic back pain, PE, scoliosis, osteoporosis, HTN, recent CVA    PT Comments    Continuing work on functional mobility and activity tolerance;  Premedicated for pain in hopes of getting more participation; +2 Max assist for all aspects of bed mobility; Sat EOB for approx 4-5 minutes, very painful LLE;  Assisted her back to bed based on her insistence to lay back down; Gently eased bed into semi-chair position; Noted tendency to keep RLE in hip internal rotation, per chart review she has stated that R hip internal rotation is normal for her; continue to recommend SNF for post-acute rehab, and for continuing efforts in encouraging more independence with mobility and ADLs   Follow Up Recommendations  SNF;Supervision/Assistance - 24 hour     Equipment Recommendations  Wheelchair (measurements PT);Hospital bed;Wheelchair cushion (measurements PT)    Recommendations for Other Services       Precautions / Restrictions Precautions Precautions: Fall Precaution Comments: also HOH and dementia Restrictions LLE Weight Bearing: Weight bearing as tolerated    Mobility  Bed Mobility Overal bed mobility: Needs Assistance Bed Mobility: Supine to Sit;Sit to Supine     Supine to sit: Max assist;+2 for physical assistance;HOB elevated Sit to supine: Max assist;+2 for physical assistance;+2 for safety/equipment   General bed mobility comments: max assist to move bil LE and trunk, pt with very limited assist through RUE to attempt to assist with rising from surface, use of pad to pivot to EOB  Transfers                 General transfer comment: Refusing OOB  transfer  Ambulation/Gait                 Stairs             Wheelchair Mobility    Modified Rankin (Stroke Patients Only)       Balance     Sitting balance-Leahy Scale: Fair Sitting balance - Comments: Sat EOB with bil UE support and min to minguard assist; lots of encouragement to stay sitting                                     Cognition Arousal/Alertness: Awake/alert Behavior During Therapy: WFL for tasks assessed/performed(Ramps into anxiety with every movement bout) Overall Cognitive Status: No family/caregiver present to determine baseline cognitive functioning Area of Impairment: Memory;Following commands;Safety/judgement                   Current Attention Level: Sustained Memory: (with history of dementia)                Exercises      General Comments        Pertinent Vitals/Pain Pain Assessment: Faces Faces Pain Scale: Hurts whole lot Pain Location: L thigh, pelvis Pain Descriptors / Indicators: Grimacing;Guarding;Aching Pain Intervention(s): Monitored during session;Premedicated before session;Repositioned    Home Living                      Prior Function  PT Goals (current goals can now be found in the care plan section) Acute Rehab PT Goals Patient Stated Goal: Did not state, but seems to be happy with the notion of discharging from the hospital PT Goal Formulation: With patient Time For Goal Achievement: 09/16/19 Potential to Achieve Goals: Fair Progress towards PT goals: Progressing toward goals(very slowly)    Frequency    Min 2X/week      PT Plan Current plan remains appropriate;Frequency needs to be updated    Co-evaluation PT/OT/SLP Co-Evaluation/Treatment: Yes Reason for Co-Treatment: Necessary to address cognition/behavior during functional activity;To address functional/ADL transfers PT goals addressed during session: Mobility/safety with mobility OT goals  addressed during session: ADL's and self-care      AM-PAC PT "6 Clicks" Mobility   Outcome Measure  Help needed turning from your back to your side while in a flat bed without using bedrails?: Total Help needed moving from lying on your back to sitting on the side of a flat bed without using bedrails?: Total Help needed moving to and from a bed to a chair (including a wheelchair)?: Total Help needed standing up from a chair using your arms (e.g., wheelchair or bedside chair)?: Total Help needed to walk in hospital room?: Total Help needed climbing 3-5 steps with a railing? : Total 6 Click Score: 6    End of Session   Activity Tolerance: Patient limited by pain Patient left: in bed;with call bell/phone within reach;with bed alarm set;Other (comment)(bed in semi-chair position) Nurse Communication: Mobility status;Need for lift equipment PT Visit Diagnosis: Other abnormalities of gait and mobility (R26.89);Repeated falls (R29.6);Pain Pain - Right/Left: Left Pain - part of body: Hip     Time: [id number] PT Time Calculation (min) (ACUTE ONLY): 27 min  Charges:  $Therapeutic Activity: 8-22 mins                     Roney Marion, PT  Acute Rehabilitation Services Pager [phone] Office [place] 09/13/2019, 1:04 PM

## 2019-09-13 NOTE — Progress Notes (Signed)
RN called Titus Dubin RN. Notified her of Covid test. She verbalized she would come up to do test. RN aware that pt has refused test 2 times already. RN educated pt on importance of test. She verbalized understanding.

## 2019-09-14 ENCOUNTER — Inpatient Hospital Stay: Payer: Medicare Other | Admitting: Adult Health

## 2019-09-14 LAB — CBC WITH DIFFERENTIAL/PLATELET
Abs Immature Granulocytes: 0.07 10*3/uL (ref 0.00–0.07)
Basophils Absolute: 0.1 10*3/uL (ref 0.0–0.1)
Basophils Relative: 1 %
Eosinophils Absolute: 0.5 10*3/uL (ref 0.0–0.5)
Eosinophils Relative: 4 %
HCT: 32.1 % — ABNORMAL LOW (ref 36.0–46.0)
Hemoglobin: 10.6 g/dL — ABNORMAL LOW (ref 12.0–15.0)
Immature Granulocytes: 1 %
Lymphocytes Relative: 15 %
Lymphs Abs: 1.9 10*3/uL (ref 0.7–4.0)
MCH: 29.5 pg (ref 26.0–34.0)
MCHC: 33 g/dL (ref 30.0–36.0)
MCV: 89.4 fL (ref 80.0–100.0)
Monocytes Absolute: 0.9 10*3/uL (ref 0.1–1.0)
Monocytes Relative: 8 %
Neutro Abs: 8.9 10*3/uL — ABNORMAL HIGH (ref 1.7–7.7)
Neutrophils Relative %: 71 %
Platelets: 413 10*3/uL — ABNORMAL HIGH (ref 150–400)
RBC: 3.59 MIL/uL — ABNORMAL LOW (ref 3.87–5.11)
RDW: 16.1 % — ABNORMAL HIGH (ref 11.5–15.5)
WBC: 12.3 10*3/uL — ABNORMAL HIGH (ref 4.0–10.5)
nRBC: 0 % (ref 0.0–0.2)

## 2019-09-14 LAB — BASIC METABOLIC PANEL
Anion gap: 10 (ref 5–15)
BUN: 19 mg/dL (ref 8–23)
CO2: 25 mmol/L (ref 22–32)
Calcium: 8.5 mg/dL — ABNORMAL LOW (ref 8.9–10.3)
Chloride: 106 mmol/L (ref 98–111)
Creatinine, Ser: 0.87 mg/dL (ref 0.44–1.00)
GFR calc Af Amer: >60 mL/min (ref >60)
GFR calc non Af Amer: >60 mL/min (ref >60)
Glucose, Bld: 102 mg/dL — ABNORMAL HIGH (ref 70–99)
Potassium: 3.2 mmol/L — ABNORMAL LOW (ref 3.5–5.1)
Sodium: 141 mmol/L (ref 135–145)

## 2019-09-14 LAB — NOVEL CORONAVIRUS, NAA (HOSP ORDER, SEND-OUT TO REF LAB; TAT 18-24 HRS): SARS-CoV-2, NAA: NOT DETECTED

## 2019-09-14 LAB — CK: Total CK: 60 U/L (ref 38–234)

## 2019-09-14 MED ORDER — POTASSIUM CHLORIDE CRYS ER 20 MEQ PO TBCR
40.0000 meq | EXTENDED_RELEASE_TABLET | Freq: Once | ORAL | Status: DC
  Filled 2019-09-14: qty 2

## 2019-09-14 MED ORDER — POTASSIUM CHLORIDE 10 MEQ/100ML IV SOLN
10.0000 meq | INTRAVENOUS | Status: AC
  Administered 2019-09-14 (×4): 10 meq via INTRAVENOUS
  Filled 2019-09-14 (×3): qty 100

## 2019-09-14 MED ORDER — POTASSIUM CHLORIDE 10 MEQ/100ML IV SOLN
INTRAVENOUS | Status: AC
  Filled 2019-09-14: qty 100

## 2019-09-14 NOTE — Plan of Care (Signed)
  Problem: Education: Goal: Knowledge of General Education information will improve Description: Including pain rating scale, medication(s)/side effects and non-pharmacologic comfort measures Outcome: Progressing   Problem: Health Behavior/Discharge Planning: Goal: Ability to manage health-related needs will improve Outcome: Not Progressing Note: Limited mobility   Problem: Clinical Measurements: Goal: Ability to maintain clinical measurements within normal limits will improve Outcome: Progressing Goal: Will remain free from infection Outcome: Progressing Goal: Diagnostic test results will improve Outcome: Progressing Goal: Respiratory complications will improve Outcome: Progressing Goal: Cardiovascular complication will be avoided Outcome: Progressing   Problem: Activity: Goal: Risk for activity intolerance will decrease Outcome: Not Progressing Note: Limited mobility   Problem: Nutrition: Goal: Adequate nutrition will be maintained Outcome: Progressing   Problem: Coping: Goal: Level of anxiety will decrease Outcome: Not Progressing Note: Anxious about SNF placement   Problem: Elimination: Goal: Will not experience complications related to bowel motility Outcome: Progressing Goal: Will not experience complications related to urinary retention Outcome: Progressing   Problem: Pain Managment: Goal: General experience of comfort will improve Outcome: Progressing   Problem: Safety: Goal: Ability to remain free from injury will improve Outcome: Progressing   Problem: Skin Integrity: Goal: Risk for impaired skin integrity will decrease Outcome: Not Progressing Note: Limited mobility

## 2019-09-14 NOTE — Progress Notes (Signed)
PROGRESS NOTE  ESMA KOOI [id number] DOB: Jan 17, 1932 DOA: 08/31/2019 PCP: Heywood Bene, PA-C  Brief History   85 year old female with past medical history of prior CVA, hypertension, arthritis, chronic pain most recently discharged 8/8 following acute CVA, dehydration, rhabdomyolysis acute renal failure who was found down at home by the grand son. Patinet was back home from SNF last thursday , was found on floor on Friday morning by grandson. He went to check onTuesday morning (9/1) and found her on floor and he called EMS and she was brought to ER.Apparently fell in bathroom, took over an hour to get her bearings and dragged herself out toward phone.Patient responded very well to narcan and narcan drip. She did not respond to fluid resuscitation in terms of Bps so PCCM asked to admit for hypotension. She also had rhabdo,aki, lactic acidosis, borderline UA, leucocytosis. CT head no acute finding. Utox with benzos and opiates. Not sure where benzodiazepines came from but note history of taking too many xanax back in 2012. Per PCCM overall impression here is she is over-medicated at home, fell, and had rhabdo/AKI from prolonged downtime and poor PO. Also has fecal impaction, deconditioning, elevated ammonia and AST/ALT pattern that could be indicative of EtOH use although she denies. Shock seems more hypovolemic given response to fluids. Questionable UTI but I guess will treat with AMS, leukocytosis, and UA, d/c abx if Pct <0.5.Patient's blood pressure improved and she was transferred to [facility] service 9/3. Patient hydrated, AKI, rhabdomyolysis improved Continue to complain of left leg pain CT scan obtained that showed nondisplaced fracture of the left pubic bone. The patient has had complaints of pain in her right breast. She claims that she has breast implants. CXR however demonstrates no evidence of implants or fracture. General surgery was curb sided. They also see no evidence of  implants. Pain will be treated conservatively with oral pain medications. Her oxycodone has been increased to 10 mg from 5mg . She states that she is more comfortable.  Repeat COVID attempted for SNF placement.   Consultants  . Orthopedic Surgery  Procedures  . None  Antibiotics   Anti-infectives (From admission, onward)   Start     Dose/Rate Route Frequency Ordered Stop   09/01/19 0900  ceFEPIme (MAXIPIME) 2 g in sodium chloride 0.9 % 100 mL IVPB     2 g 200 mL/hr over 30 Minutes Intravenous Every 24 hours 08/31/19 0900 09/07/19 1138   08/31/19 0900  vancomycin variable dose per unstable renal function (pharmacist dosing)  Status:  Discontinued      Does not apply See admin instructions 08/31/19 0900 09/01/19 0924   08/31/19 0800  ceFEPIme (MAXIPIME) 2 g in sodium chloride 0.9 % 100 mL IVPB     2 g 200 mL/hr over 30 Minutes Intravenous  Once 08/31/19 0747 08/31/19 0948   08/31/19 0800  metroNIDAZOLE (FLAGYL) IVPB 500 mg     500 mg 100 mL/hr over 60 Minutes Intravenous  Once 08/31/19 0747 08/31/19 1034   08/31/19 0800  vancomycin (VANCOCIN) IVPB 1000 mg/200 mL premix     1,000 mg 200 mL/hr over 60 Minutes Intravenous  Once 08/31/19 0747 08/31/19 1419     .   Subjective  The patient is resting comfortably. No new complaints.  Objective   Vitals:  Vitals:   09/14/19 1344 09/14/19 1452  BP: (!) 116/55 127/68  Pulse: 82 76  Resp:  17  Temp: 98.3 F (36.8 C) 98.3 F (36.8 C)  SpO2: 93% 93%  Exam:  Constitutional:  . The patient is awake, alert, and oriented x 3. No acute distress. Respiratory:  . There is no increased work of breathing. . No wheezes, rales, or rhonchi. . No tactile fremitus. Cardiovascular:  . Regular rate and rhythm. . No murmurs, ectopy, or gallups. . No lateral PMI. No thrills. Abdomen:  . Abdomen is soft, non-tender, non-distended. . No hernias, masses, or organomegaly. . Normoactive bowel sounds.  Musculoskeletal:  . No cyanosis,  clubbing or edema . No lesions, bruising or ulcers on feet/heels bilaterally. Skin:  . No rashes, lesions, ulcers . palpation of skin: no induration or nodule  Neuro: The patient is awake, alert, and oriented x 3.  CN II - XII grossly intact. Patient is moving all extremities.  I have personally reviewed the following:   Today's Data  . Vitals, BMP  Scheduled Meds: . amLODipine  10 mg Oral Daily  . aspirin EC  81 mg Oral Daily  . bisacodyl  10 mg Rectal Once  . Chlorhexidine Gluconate Cloth  6 each Topical Daily  . escitalopram  20 mg Oral Daily  . heparin  5,000 Units Subcutaneous Q8H  . pantoprazole  40 mg Oral Daily  . polyethylene glycol  17 g Oral Daily  . potassium chloride  40 mEq Oral Once  . senna-docusate  2 tablet Oral BID   Continuous Infusions: . sodium chloride 75 mL/hr at 09/14/19 1109  . potassium chloride 10 mEq (09/14/19 1749)    Active Problems:   ANXIETY DEPRESSION   Chronic pain   Essential hypertension   Abnormal liver function   Shock ([place])   UTI (urinary tract infection)   Rhabdomyolysis   LOS: 14 days   A & P  Acute metabolic encephalopathy/delirium: Resolved. Cause is likely multifactorial secondary to UTI, metabolic abnormalities, and opiates.  Rhabdomyolysis in the setting of fall and prolonged time laying on the floor: Resolving.  CPK 390 on 09/09/2019. Continue IV fluids and to trend CPK.   Left Heel pain: X-rays were negative for fracture or other acute abnormality. Have asked nursing to float the heels.   AKI: Resolving with improvement in rhabdomyolysis and IV fluids. Baseline creatinine is about 0.90. She was admitted with creatinine of 2.41. 0.96 on 09/11/2019.  Left pubic bone fracture post fall: Pain management in place with tramadol 50 mg twice daily and oxycodone 5 mg every 4 hours as needed for severe and breakthrough pain. Continue bowel regimen to avoid opiate-induced constipation. PT OT recommended SNF. CSW consulted to  assist with SNF placement. Pain control with Oxycodone increased to 10 mg.   Continue fall precautions. Awaiting bed placement at SNF.  Right breast injury with questionable breast implants: Patient reports she has had breast implants that are causing her pain. No clear evidence of breast implant on chest imagings. Curb sided with general surgery Dr. Georgiana Spinner who also reviewed chest imagings. Continue conservative management and pain control.  Uncontrolled hypertension: Partially due to pain. She has also been started on Norvasc 10 mg daily with IV labetalol as needed. Not on blood pressure medications at home.  Hypokalemia: Monitor and supplement as necessary.  Hypomagnesemia: Supplement and monitor.  Pseudomonal UTI: Urine culture from 08/31/19 positive for pseudomonas aeruginosa resistant to ciprofloxacin. The patient has completed 7 days of IV cefepime. Blood cultures x2 drawn on 08/31/2019 final negative.  Pseudo-hypocalcemia: Calcium 7.7, albumin 2.3. Corrected calcium for albumin 9.1.  Improving AKI on CKD 3: Appears to be at her baseline creatinine 0.96  with GFR  54 Continue to avoid nephrotoxins and hypotension. Monitor creatinine, electrolytes, and volume status.  Elevated LFTs, improving: Elevated ALT and AST, levels continue to trend down. Related to rhabdomyolysis. Acute hepatitis panel negative.  Chronic anxiety/depression: Continue escitalopram.  GERD: Continue PPI.  Hyperlipidemia: Continue statin.  Ambulatory dysfunction/physical debility: PT OT assessment recommended SNF. CSW assisting with SNF placement. Continue fall precautions.  I have seen and examined this patient myself. I have spent 28 minutes in her evaluation and care.  DVT prophylaxis:Heparin subcu 3 times daily Code Status: Full code Family Communication: None at bedside.  Will call grandson. Disposition Plan: Pending bed placement.  Kinneth Fujiwara, DO [facility] Direct contact: see  [email or web address]  7PM-7AM contact night coverage as above 09/14/2019, 6:41 PM  LOS: 9 days

## 2019-09-14 NOTE — TOC Progression Note (Signed)
Transition of Care [facility]) - Progression Note    Patient Details  Name: Angelica Bell MRN: [id number] Date of Birth: Oct 01, 1932  Transition of Care [facility]) CM/SW [place], LCSW Phone Number: 09/14/2019, 2:32 PM  Clinical Narrative:    CSW received insurance approval for patient to go to [facility] tomorrow 09/15/19: [id number]. Patient's grandson confirmed that he submitted the Medicaid application online last night. [place] Gard[place] Ramus is sending patient's grandson the admission paperwork since he is going to be out of state tomorrow. Updated COVID test has resulted.     Expected Discharge Plan: Skilled Nursing Facility Barriers to Discharge: Ship broker, Continued Medical Work up  Expected Discharge Plan and Services Expected Discharge Plan: [place] In-house Referral: Clinical Social Work Discharge Planning Services: NA Post Acute Care Choice: [place] Living arrangements for the past 2 months: [place], Single Family Home                 DME Arranged: N/A DME Agency: NA       HH Arranged: NA HH Agency: NA         Social Determinants of Health (SDOH) Interventions    Readmission Risk Interventions No flowsheet data found.

## 2019-09-14 NOTE — Progress Notes (Signed)
   09/14/19 2218  MEWS Score  Resp 18  Pulse Rate 78  BP 132/62  Temp 98.6 F (37 C)  SpO2 95 %  MEWS Score  MEWS RR 0  MEWS Pulse 0  MEWS Systolic 0  MEWS LOC 0  MEWS Temp 0  MEWS Score 0  MEWS Score Color Green  MEWS Assessment  Is this an acute change? No  MEWS Guidelines - (patients age 83 and over)  Red - At High Risk for Deterioration Yellow - At risk for Deterioration  1. Go to room and assess patient 2. Validate data. Is this patient's baseline? If data confirmed: 3. Is this an acute change? 4. Administer prn meds/treatments as ordered. 5. Note Sepsis score 6. Review goals of care 7. Sports coach, RRT nurse and Provider. 8. Ask Provider to come to bedside.  9. Document patient condition/interventions/response. 10. Increase frequency of vital signs and focused assessments to at least q15 minutes x 4, then q30 minutes x2. - If stable, then q1h x3, then q4h x3 and then q8h or dept. routine. - If unstable, contact Provider & RRT nurse. Prepare for possible transfer. 11. Add entry in progress notes using the smart phrase ".MEWS". 1. Go to room and assess patient 2. Validate data. Is this patient's baseline? If data confirmed: 3. Is this an acute change? 4. Administer prn meds/treatments as ordered? 5. Note Sepsis score 6. Review goals of care 7. Sports coach and Provider 8. Call RRT nurse as needed. 9. Document patient condition/interventions/response. 10. Increase frequency of vital signs and focused assessments to at least q2h x2. - If stable, then q4h x2 and then q8h or dept. routine. - If unstable, contact Provider & RRT nurse. Prepare for possible transfer. 11. Add entry in progress notes using the smart phrase ".MEWS".  Green - Likely stable [place] - Comfort Care Only  1. Continue routine/ordered monitoring.  2. Review goals of care. 1. Continue routine/ordered monitoring. 2. Review goals of care.

## 2019-09-14 NOTE — Plan of Care (Signed)

## 2019-09-15 DIAGNOSIS — G8929 Other chronic pain: Secondary | ICD-10-CM

## 2019-09-15 LAB — BASIC METABOLIC PANEL
Anion gap: 9 (ref 5–15)
BUN: 14 mg/dL (ref 8–23)
CO2: 25 mmol/L (ref 22–32)
Calcium: 8.6 mg/dL — ABNORMAL LOW (ref 8.9–10.3)
Chloride: 105 mmol/L (ref 98–111)
Creatinine, Ser: 0.81 mg/dL (ref 0.44–1.00)
GFR calc Af Amer: >60 mL/min (ref >60)
GFR calc non Af Amer: >60 mL/min (ref >60)
Glucose, Bld: 97 mg/dL (ref 70–99)
Potassium: 3.6 mmol/L (ref 3.5–5.1)
Sodium: 139 mmol/L (ref 135–145)

## 2019-09-15 LAB — CK: Total CK: 41 U/L (ref 38–234)

## 2019-09-15 MED ORDER — OXYCODONE HCL 15 MG PO TABS: 15.0000 mg | ORAL_TABLET | ORAL | 0 refills | Status: DC | PRN

## 2019-09-15 MED ORDER — ASPIRIN 81 MG PO TBEC: 81.0000 mg | DELAYED_RELEASE_TABLET | Freq: Every day | ORAL | 0 refills | Status: DC

## 2019-09-15 NOTE — TOC Transition Note (Signed)
Transition of Care [facility]) - CM/SW Discharge Note   Patient Details  Name: ARANYA MORELES MRN: [id number] Date of Birth: 12-12-1932  Transition of Care [facility]) CM/SW Contact:  Ralph Dowdy, [place] Work Phone Number: 09/15/2019, 10:39 AM   Clinical Narrative:    Pt states that she does not want to go to [facility]. Only other offer is [facility]. She states agreement to go to [facility] instead. PTARR being notified.     Final next level of care: Skilled Nursing Facility Barriers to Discharge: No Barriers Identified   Patient Goals and CMS Choice Patient states their goals for this hospitalization and ongoing recovery are:: Pt grandson is agreeable to SNF CMS Medicare.gov Compare Post Acute Care list provided to:: Patient Represenative (must comment) Choice offered to / list presented to : [facility] POA / Guardian(Pt grandson)  Discharge Placement   Existing PASRR number confirmed : 09/15/19          Patient chooses bed at: [place] Patient to be transferred to facility by: [place] Name of family member notified: Haynes Bast [name]) Patient and family notified of of transfer: 09/15/19  Discharge Plan and Services In-house Referral: Clinical Social Work Discharge Planning Services: NA Post Acute Care Choice: [place]          DME Arranged: N/A DME Agency: NA       HH Arranged: NA HH Agency: NA        Social Determinants of Health (SDOH) Interventions     Readmission Risk Interventions No flowsheet data found.

## 2019-09-15 NOTE — TOC Transition Note (Signed)
Transition of Care [facility]) - CM/SW Discharge Note   Patient Details  Name: MEG BOURLIER MRN: [id number] Date of Birth: 12/11/1932  Transition of Care [facility]) CM/SW Contact:  Benard Halsted, LCSW Phone Number: 09/15/2019, 10:44 AM   Clinical Narrative:    Patient will DC to: [place]  Anticipated DC date: 09/15/19 Family notified: [name] Transport by: Corey Harold   Per MD patient ready for DC to [facility]. RN, patient, patient's family, and facility notified of DC. Discharge Summary and FL2 sent to facility. RN to call report prior to discharge [phone] Room 127a). DC packet on chart. Ambulance transport requested for patient.   CSW will sign off for now as social work intervention is no longer needed. Please consult [place] again if new needs arise.  Cedric Fishman, LCSW Clinical Social Worker [phone]    Final next level of care: Skilled Nursing Facility Barriers to Discharge: No Barriers Identified   Patient Goals and CMS Choice Patient states their goals for this hospitalization and ongoing recovery are:: Pt grandson is agreeable to SNF CMS Medicare.gov Compare Post Acute Care list provided to:: Patient Represenative (must comment) Choice offered to / list presented to : [facility] POA / Guardian(Pt grandson)  Discharge Placement   Existing PASRR number confirmed : 09/15/19          Patient chooses bed at: [place] Patient to be transferred to facility by: [place] Name of family member notified: Haynes Bast [name]) Patient and family notified of of transfer: 09/15/19  Discharge Plan and Services In-house Referral: Clinical Social Work Discharge Planning Services: NA Post Acute Care Choice: [place]          DME Arranged: N/A DME Agency: NA       HH Arranged: NA HH Agency: NA        Social Determinants of Health (SDOH) Interventions     Readmission Risk Interventions No flowsheet data found.

## 2019-09-15 NOTE — TOC Progression Note (Signed)
Transition of Care [facility]) - Progression Note    Patient Details  Name: Angelica Bell MRN: [id number] Date of Birth: 1932-03-27  Transition of Care [facility]) CM/SW [place], LCSW Phone Number: 09/15/2019, 8:53 AM  Clinical Narrative:    CSW met with patient's grandson to complete paperwork. CSW faxed paperwork to [facility]. CSW notes patient's concern about the SNF but she does not have another option and we discussed this last night with her grandson present.    Expected Discharge Plan: Skilled Nursing Facility Barriers to Discharge: Ship broker, Continued Medical Work up  Expected Discharge Plan and Services Expected Discharge Plan: [place] In-house Referral: Clinical Social Work Discharge Planning Services: NA Post Acute Care Choice: [place] Living arrangements for the past 2 months: [place], Single Family Home                 DME Arranged: N/A DME Agency: NA       HH Arranged: NA HH Agency: NA         Social Determinants of Health (SDOH) Interventions    Readmission Risk Interventions No flowsheet data found.

## 2019-09-15 NOTE — Discharge Summary (Signed)
Physician Discharge Summary  Angelica Bell [id number] DOB: 05-Mar-1932 DOA: 08/31/2019  PCP: Heywood Bene, PA-C  Admit date: 08/31/2019 Discharge date: 09/15/2019  Admitted From: Home Disposition: Skilled nursing facility  Recommendations for Outpatient Follow-up:  1. Follow up with PCP in 1-2 weeks 2. Please obtain BMP/CBC in one week 3. Please follow up on the following pending results:  Discharge Condition: Stable CODE STATUS: Full code Diet recommendation: Heart Healthy  Brief/Interim Summary: 85 year old female with past medical history of prior CVA, hypertension, arthritis, chronic pain most recently discharged 8/8 following acute CVA, dehydration, rhabdomyolysis acute renal failure who was found down at home by the grand son. Patinet was back home from SNF, later was found on floor on Friday morning by grandson. He went to check onTuesday morning (9/1) and found her on floor and he called EMS and she was brought to ER.Apparently fell in bathroom, took over an hour to get her bearings and dragged herself out toward phone.Patient responded very well to narcan and narcan drip. She did not respond to fluid resuscitation in terms of Bp so PCCM asked to admit for hypotension. She also had rhabdo,aki, lactic acidosis, borderline UA, leucocytosis. CT head no acute finding. Utox with benzos and opiates.  Per PCCM overall impression here is she is over-medicated at home, fell, and had rhabdo/AKI from prolonged downtime and poor PO. Also had fecal impaction, deconditioning, elevated ammonia and AST/ALT pattern that could be indicative of EtOH use although she denies. Shock seems more hypovolemic given response to fluids. Patient's blood pressure improved and she was transferred to [facility] service 9/3.  She was treated with IV cefepime for UTI and Pseudomonas.  Patient hydrated, AKI, rhabdomyolysis improved Continue to complain of left leg pain CT scan obtained that showed nondisplaced  fracture of the left pubic bone. The patient has had complaints of pain in her right breast. She claims that she has breast implants. CXR however demonstrates no evidence of implants or fracture. General surgery was curb sided. They also see no evidence of implants. Pain will be treated conservatively with oral pain medications. Her oxycodone has been increased. She states that she is more comfortable.  Acute metabolic encephalopathy/delirium: Resolved. Cause is likely multifactorial secondary to UTI, metabolic abnormalities, and opiates.  Rhabdomyolysis in the setting of fall and prolonged time laying on the floor: Resolving.  CPK now normal after IV hydration.   Left Heel pain: X-rays were negative for fracture or other acute abnormality. Have asked nursing to float the heels.   AKI: Resolved with improvement in rhabdomyolysis and IV fluids. Baseline creatinine is about 0.90. She was admitted with creatinine of 2.41. 0.96 on 09/11/2019.  Left pubic bone fracture post fall: Pain management in place with oxycodone 15 mg every 4 hours as needed for severe and breakthrough pain. Continue bowel regimen to avoid opiate-induced constipation. PT OT recommended SNF. CSW consulted to assist with SNF placement.   Continue fall precautions.  Patient will be discharged home SNF.  Right breast injury with questionable breast implants: Patient reports she has had breast implants that are causing her pain. No clear evidence of breast implant on chest imagings. Curb sided with general surgery Dr. Georgiana Spinner who also reviewed chest imagings. Continue conservative management and pain control.  Uncontrolled hypertension: Partially due to pain. She has also been started on Norvasc 10 mg daily with IV labetalol as needed.  Blood pressure better at this time.  Hypokalemia: Replaced.  Continue to monitor and supplement as necessary.  Hypomagnesemia: Replaced. Continue to monitor and replace as  needed.  Pseudomonal UTI: Urine culture from 08/31/19 positive for pseudomonas aeruginosa resistant to ciprofloxacin. The patient has completed IV cefepime. Blood cultures x2 drawn on 08/31/2019 final negative.  Pseudo-hypocalcemia: Calcium 7.7, albumin 2.3. Corrected calcium for albumin 9.1.  Improving AKI on CKD 3: Appears to be at her baseline creatinine 0.96 with GFR  54 Continue to avoid nephrotoxins and hypotension. Monitor creatinine, electrolytes, and volume status.  Elevated LFTs, improving: Elevated ALT and AST, levels continue to trend down. Related to rhabdomyolysis. Acute hepatitis panel negative.  Chronic anxiety/depression: Continue escitalopram.  GERD: Continue PPI.  Hyperlipidemia: Continue statin.  DVT Prophylaxis: Patient was on subcutaneous heparin while here for DVT prophylaxis.  Continue prophylaxis per the skilled nursing facility.  Patient is aware of the plan and is being discharged to skilled nursing facility in stable condition.  Discharge Diagnoses:  Active Problems:   ANXIETY DEPRESSION   Chronic pain   Essential hypertension   Abnormal liver function   Shock ([place])   UTI (urinary tract infection)   Rhabdomyolysis    Discharge Instructions Advised to follow-up with her outpatient physicians.  At this time she is being discharged to skilled nursing facility.   Allergies as of 09/15/2019      Reactions   Hctz [hydrochlorothiazide]    Significant and rapid hyponatremia (TIH)   Amoxicillin-pot Clavulanate Hives, Diarrhea   Has patient had a PCN reaction causing immediate rash, facial/tongue/throat swelling, SOB or lightheadedness with hypotension: yes Has patient had a PCN reaction causing severe rash involving mucus membranes or skin necrosis: yes Has patient had a PCN reaction that required hospitalization : unknown Has patient had a PCN reaction occurring within the last 10 years: yes If all of the above answers are "NO", then may proceed  with Cephalosporin use.   Lisinopril-hydrochlorothiazide Other (See Comments)   Tired- no energy to do anything..   Sulfa Antibiotics Hives, Nausea And Vomiting   Sulfonamide Derivatives Hives      Medication List    TAKE these medications   amLODipine 10 MG tablet Commonly known as: NORVASC Take 1 tablet (10 mg total) by mouth daily.   aspirin 81 MG EC tablet Take 1 tablet (81 mg total) by mouth daily.   atorvastatin 10 MG tablet Commonly known as: LIPITOR Take 1 tablet (10 mg total) by mouth daily at 6 PM.   escitalopram 20 MG tablet Commonly known as: LEXAPRO Take 1 tablet (20 mg total) by mouth daily.   mirabegron ER 25 MG Tb24 tablet Commonly known as: MYRBETRIQ Take 1 tablet (25 mg total) by mouth daily.   Muscle Rub 10-15 % Crea Apply 1 application topically as needed for muscle pain.   oxyCODONE 15 MG immediate release tablet Commonly known as: ROXICODONE Take 1 tablet (15 mg total) by mouth every 4 (four) hours as needed for severe pain or breakthrough pain. What changed:   medication strength  how much to take  when to take this  reasons to take this   pantoprazole 40 MG tablet Commonly known as: PROTONIX Take 1 tablet (40 mg total) by mouth daily.   polyethylene glycol 17 g packet Commonly known as: MIRALAX / GLYCOLAX Take 17 g by mouth daily.       Contact information for follow-up providers    Heywood Bene, PA-C. Call in 1 day(s).   Specialty: Physician Assistant Why: please call for a post hospital follow up appointment Contact information: [address]  [place] [address] [phone]            Contact information for after-discharge care    Destination    [facility] .   Service: Skilled Nursing Contact information: [address] [address] [phone]                 Allergies  Allergen Reactions  . Hctz [Hydrochlorothiazide]     Significant  and rapid hyponatremia (TIH)  . Amoxicillin-Pot Clavulanate Hives and Diarrhea    Has patient had a PCN reaction causing immediate rash, facial/tongue/throat swelling, SOB or lightheadedness with hypotension: yes Has patient had a PCN reaction causing severe rash involving mucus membranes or skin necrosis: yes Has patient had a PCN reaction that required hospitalization : unknown Has patient had a PCN reaction occurring within the last 10 years: yes If all of the above answers are "NO", then may proceed with Cephalosporin use.   Marland Kitchen Lisinopril-Hydrochlorothiazide Other (See Comments)    Tired- no energy to do anything..  . Sulfa Antibiotics Hives and Nausea And Vomiting  . Sulfonamide Derivatives Hives     Procedures/Studies: Ct Abdomen Pelvis Wo Contrast  Result Date: 08/31/2019 CLINICAL DATA:  Rib pain, possible acute mesenteric ischemia. EXAM: CT CHEST, ABDOMEN AND PELVIS WITHOUT CONTRAST TECHNIQUE: Multidetector CT imaging of the chest, abdomen and pelvis was performed following the standard protocol without IV contrast. COMPARISON:  None. FINDINGS: CT CHEST FINDINGS Cardiovascular: Atherosclerosis of thoracic aorta is noted without aneurysm formation. Dissection cannot be excluded due to the lack of intravenous contrast. Normal cardiac size. No pericardial effusion is noted. Mild coronary artery calcifications are noted. Mediastinum/Nodes: Large sliding-type hiatal hernia is noted. 16 mm left thyroid nodule is noted. No adenopathy is noted. Lungs/Pleura: Lungs are clear. No pleural effusion or pneumothorax. Musculoskeletal: No chest wall mass or suspicious bone lesions identified. CT ABDOMEN PELVIS FINDINGS Hepatobiliary: No focal liver abnormality is seen. No gallstones, gallbladder wall thickening, or biliary dilatation. Pancreas: Unremarkable. No pancreatic ductal dilatation or surrounding inflammatory changes. Spleen: Normal in size without focal abnormality. Adrenals/Urinary Tract: Adrenal  glands and kidneys appear normal. No hydronephrosis or renal obstruction is noted. No renal or ureteral calculi are noted. Evaluation of the bladder is severely limited due to scatter artifact arising from bilateral hip surgical hardware. Stomach/Bowel: The stomach is unremarkable. There is no evidence of bowel obstruction or inflammation. The appendix is not clearly visualized. Large amount of stool is seen in the sigmoid colon and rectum concerning for possible impaction. Vascular/Lymphatic: Aortic atherosclerosis. No enlarged abdominal or pelvic lymph nodes. Due to the lack of intravenous contrast, mesenteric ischemia cannot be excluded on the basis of this exam. Reproductive: Status post hysterectomy. No adnexal masses. Other: No abdominal wall hernia or abnormality. No abdominopelvic ascites. Musculoskeletal: Status post right hip arthroplasty. Status post surgical repair of proximal left femoral fracture. No acute osseous abnormality is noted. IMPRESSION: Large sliding-type hiatal hernia. 1.6 cm left thyroid nodule is noted. Thyroid ultrasound is recommended for further evaluation. Large amount of stool is seen in sigmoid colon and rectum concerning for possible impaction. No definite acute abnormality is noted in the chest, abdomen or pelvis. Due to the lack of intravenous contrast, the patency of mesenteric vessels could not be assessed on this study. Aortic Atherosclerosis (ICD10-I70.0). Electronically Signed   By: Marijo Conception M.D.   On: 08/31/2019 10:43   Dg Pelvis 1-2 Views  Result Date: 09/01/2019 CLINICAL DATA:  Left hip pain post fall. EXAM: PELVIS - 1-2 VIEW COMPARISON:  August 31, 2019 FINDINGS: Limited single view of the left hip, with internal rotation of the left femur. Post intramedullary nail fixation of the left femur. No evidence of femoral head fracture. Fracture along the greater trochanter is difficult to exclude. Prior total right hip arthroplasty, with intact components.  IMPRESSION: 1. Limited single view of the left hip, with internal rotation of the left femur. 2. Fracture along the greater trochanter is difficult to exclude. 3. Hardware intact. Electronically Signed   By: Fidela Salisbury M.D.   On: 09/01/2019 16:04   Ct Head Wo Contrast  Result Date: 08/31/2019 CLINICAL DATA:  Altered level of consciousness EXAM: CT HEAD WITHOUT CONTRAST TECHNIQUE: Contiguous axial images were obtained from the base of the skull through the vertex without intravenous contrast. COMPARISON:  Multiple exams, including brain MRI dated 07/27/2019 FINDINGS: Brain: Remote right cerebellar infarct, image 8/3. Hypodensity in the left occipital white matter on image 17/3 corresponding to the region of at the time subacute infarct shown on MRI from 07/27/2019. 1.1 by 0.6 cm prior small infarct in the right parietal corona radiata corresponding to the at the time subacute infarct shown on 07/27/2019 MRI. Periventricular white matter and corona radiata hypodensities favor chronic ischemic microvascular white matter disease. Otherwise, the brainstem, cerebellum, cerebral peduncles, thalamus, basal ganglia, basilar cisterns, and ventricular system appear within normal limits. No intracranial hemorrhage, mass lesion, or acute CVA. Vascular: There is atherosclerotic calcification of the cavernous carotid arteries bilaterally. Vertebral artery atherosclerotic calcification. Skull: Unremarkable Sinuses/Orbits: Mild chronic left sphenoid, left maxillary, and ethmoid sinusitis. Other: No supplemental non-categorized findings. IMPRESSION: 1. Evolutionary findings in the small white matter infarcts in the left occipital lobe right parietal lobe, currently hypodense indicating increased chronicity. Both of these were subacute at the time of prior brain MRI of 07/27/2019. 2. Old right cerebellar infarct. 3. Periventricular white matter and corona radiata hypodensities favor chronic ischemic microvascular white  matter disease. 4. No acute findings. 5. Atherosclerosis. 6. Mild chronic paranasal sinusitis. Electronically Signed   By: Van Clines M.D.   On: 08/31/2019 08:48   Ct Chest Wo Contrast  Result Date: 08/31/2019 CLINICAL DATA:  Rib pain, possible acute mesenteric ischemia. EXAM: CT CHEST, ABDOMEN AND PELVIS WITHOUT CONTRAST TECHNIQUE: Multidetector CT imaging of the chest, abdomen and pelvis was performed following the standard protocol without IV contrast. COMPARISON:  None. FINDINGS: CT CHEST FINDINGS Cardiovascular: Atherosclerosis of thoracic aorta is noted without aneurysm formation. Dissection cannot be excluded due to the lack of intravenous contrast. Normal cardiac size. No pericardial effusion is noted. Mild coronary artery calcifications are noted. Mediastinum/Nodes: Large sliding-type hiatal hernia is noted. 16 mm left thyroid nodule is noted. No adenopathy is noted. Lungs/Pleura: Lungs are clear. No pleural effusion or pneumothorax. Musculoskeletal: No chest wall mass or suspicious bone lesions identified. CT ABDOMEN PELVIS FINDINGS Hepatobiliary: No focal liver abnormality is seen. No gallstones, gallbladder wall thickening, or biliary dilatation. Pancreas: Unremarkable. No pancreatic ductal dilatation or surrounding inflammatory changes. Spleen: Normal in size without focal abnormality. Adrenals/Urinary Tract: Adrenal glands and kidneys appear normal. No hydronephrosis or renal obstruction is noted. No renal or ureteral calculi are noted. Evaluation of the bladder is severely limited due to scatter artifact arising from bilateral hip surgical hardware. Stomach/Bowel: The stomach is unremarkable. There is no evidence of bowel obstruction or inflammation. The appendix is not clearly visualized. Large amount of stool is seen in the sigmoid colon and rectum concerning  for possible impaction. Vascular/Lymphatic: Aortic atherosclerosis. No enlarged abdominal or pelvic lymph nodes. Due to the lack of  intravenous contrast, mesenteric ischemia cannot be excluded on the basis of this exam. Reproductive: Status post hysterectomy. No adnexal masses. Other: No abdominal wall hernia or abnormality. No abdominopelvic ascites. Musculoskeletal: Status post right hip arthroplasty. Status post surgical repair of proximal left femoral fracture. No acute osseous abnormality is noted. IMPRESSION: Large sliding-type hiatal hernia. 1.6 cm left thyroid nodule is noted. Thyroid ultrasound is recommended for further evaluation. Large amount of stool is seen in sigmoid colon and rectum concerning for possible impaction. No definite acute abnormality is noted in the chest, abdomen or pelvis. Due to the lack of intravenous contrast, the patency of mesenteric vessels could not be assessed on this study. Aortic Atherosclerosis (ICD10-I70.0). Electronically Signed   By: Marijo Conception M.D.   On: 08/31/2019 10:43   Ct Cervical Spine Wo Contrast  Result Date: 08/31/2019 CLINICAL DATA:  85 year old female with history of altered mental status. EXAM: CT CERVICAL SPINE WITHOUT CONTRAST TECHNIQUE: Multidetector CT imaging of the cervical spine was performed without intravenous contrast. Multiplanar CT image reconstructions were also generated. COMPARISON:  Cervical spine CT 07/29/2017. FINDINGS: Alignment: Normal. Skull base and vertebrae: No acute fracture. No primary bone lesion or focal pathologic process. Soft tissues and spinal canal: No prevertebral fluid or swelling. No visible canal hematoma. Disc levels: Multilevel degenerative disc disease, most severe at C4-C5, C5-C6 and C6-C7. Mild multilevel facet arthropathy. Upper chest: Unremarkable. Other: Mild mucosal thickening in the left sphenoid sinus. IMPRESSION: 1. No acute radiographic abnormality of the cervical spine. 2. Multilevel degenerative disc disease and cervical spondylosis, as above. Electronically Signed   By: Vinnie Langton M.D.   On: 08/31/2019 08:48   Dg Pelvis  Portable  Result Date: 08/31/2019 CLINICAL DATA:  Found unresponsive EXAM: PORTABLE PELVIS 1-2 VIEWS COMPARISON:  07/25/2019 FINDINGS: Pelvic ring is intact. Prior right hip replacement and surgical fixation of the proximal left femur are seen and stable. No fracture or hardware failure is noted. Degenerative changes of lumbar spine are seen. IMPRESSION: Postsurgical changes without acute abnormality. Electronically Signed   By: Inez Catalina M.D.   On: 08/31/2019 08:18   Ct Hip Left Wo Contrast  Result Date: 09/01/2019 CLINICAL DATA:  Hip pain. EXAM: CT OF THE LEFT HIP WITHOUT CONTRAST TECHNIQUE: Multidetector CT imaging of the left hip was performed according to the standard protocol. Multiplanar CT image reconstructions were also generated. COMPARISON:  X-ray from the same day.  CT dated August 31, 2019. FINDINGS: Bones/Joint/Cartilage The patient is status post placement of an intramedullary nail for a known intratrochanteric fracture of the proximal left femur. This fracture is healed. The hardware is intact. There is a nondisplaced fracture involving the parasymphyseal left pubic bone. There is an age-indeterminate fracture through the anterior column of the left acetabulum. The patient's femoral screw nearly extends beyond the cortex into the joint space. Ligaments Suboptimally assessed by CT. Muscles and Tendons There is some edema extending through the myofascial planes of the left lower extremity. Soft tissues There is nonspecific lower extremity edema involving the left lower extremity. A rectal tube is in place. A Foley catheter is in place. IMPRESSION: 1. Nondisplaced fracture of the left parasymphyseal pubic bone. 2. Age-indeterminate nondisplaced fracture involving the anterior column of the left acetabulum. The appearance favors a subacute fracture. 3. Stable orthopedic hardware involving the left lower extremity. 4. Nonspecific soft tissue swelling about the left lower extremity.  Electronically  Signed   By: Constance Holster M.D.   On: 09/01/2019 21:12   Dg Chest Port 1 View  Result Date: 09/01/2019 CLINICAL DATA:  Sepsis, NOS EXAM: PORTABLE CHEST 1 VIEW COMPARISON:  CT 08/31/2019, radiograph 08/31/2019 FINDINGS: Support devices overlie the chest. Lung volumes are diminished with new bandlike areas of atelectasis throughout both lungs. Furthermore there is increasing hazy interstitial opacity with some cephalization of the pulmonary vascularity and fissural and septal thickening to suggest developing pulmonary edema. There is gradient opacity in the left lung base which could reflect a layering effusion. Cardiomediastinal contours are are similar to priors accounting for differences technique including a calcified, tortuous aorta. No acute osseous or soft tissue abnormality. Remote posttraumatic deformity of the left humeral head. Severe dextrocurvature of the thoracic spine. IMPRESSION: 1. Low lung volumes with increasing bandlike areas of atelectasis throughout both lungs. 2. Radiographic features suggest some developing interstitial edema and layering left effusion. 3. Large hiatal hernia. 4.  Aortic Atherosclerosis (ICD10-I70.0). Electronically Signed   By: Lovena Le M.D.   On: 09/01/2019 05:46   Dg Chest Portable 1 View  Result Date: 08/31/2019 CLINICAL DATA:  Found unresponsive EXAM: PORTABLE CHEST 1 VIEW COMPARISON:  07/26/2019 FINDINGS: Cardiac shadow is mildly prominent but accentuated by the portable technique. Aortic calcifications are again noted and stable. The lungs are well aerated bilaterally. Very mild increased density is noted in the left base likely related atelectasis although the possibility of aspiration cannot be totally excluded. Hiatal hernia is again noted and stable. Old left humeral fracture is noted. IMPRESSION: Mild increased density in the left base which may represent atelectasis or possible aspiration. Electronically Signed   By: Inez Catalina M.D.   On:  08/31/2019 08:17   Dg Foot Complete Left  Result Date: 09/11/2019 CLINICAL DATA:  Left foot pain for days. EXAM: LEFT FOOT - COMPLETE 3+ VIEW COMPARISON:  None. FINDINGS: There is no evidence of fracture or dislocation. Bones are diffusely under mineralized. Mild hammertoe deformity of the digits. Mild multifocal osteoarthritis, most prominent involving the great toe. No evidence of inflammatory arthropathy or bony destructive change. Soft tissues are unremarkable. IMPRESSION: Mild multifocal osteoarthritis, most prominent involving the great toe. No acute osseous abnormality. Electronically Signed   By: Keith Rake M.D.   On: 09/11/2019 19:31   Dg Femur Min 2 Views Left  Result Date: 09/01/2019 CLINICAL DATA:  Left hip pain. EXAM: LEFT FEMUR 2 VIEWS COMPARISON:  Left femur x-rays dated July 26, 2019. FINDINGS: No acute fracture or dislocation. Healed left intertrochanteric fracture status post gamma nail fixation. No evidence of hardware failure or loosening. The left hip joint space is preserved. Osteopenia. Soft tissues are unremarkable. IMPRESSION: 1.  No acute osseous abnormality. 2. Healed left intertrochanteric fracture status post ORIF. Electronically Signed   By: Titus Dubin M.D.   On: 09/01/2019 16:02   Vas [place] Lower Extremity Venous (dvt) (only [facility])  Result Date: 09/01/2019  Lower Venous Study Indications: Swelling, and Edema.  Limitations: Poor ultrasound/tissue interface. Comparison Study: no prior Performing Technologist: Abram Sander RVS  Examination Guidelines: A complete evaluation includes B-mode imaging, spectral Doppler, color Doppler, and power Doppler as needed of all accessible portions of each vessel. Bilateral testing is considered an integral part of a complete examination. Limited examinations for reoccurring indications may be performed as noted.  +---------+---------------+---------+-----------+----------+--------------+ RIGHT     CompressibilityPhasicitySpontaneityPropertiesThrombus Aging +---------+---------------+---------+-----------+----------+--------------+ CFV      Full  Yes      Yes                                 +---------+---------------+---------+-----------+----------+--------------+ SFJ      Full                                                        +---------+---------------+---------+-----------+----------+--------------+ FV Prox  Full                                                        +---------+---------------+---------+-----------+----------+--------------+ FV Mid   Full                                                        +---------+---------------+---------+-----------+----------+--------------+ FV DistalFull                                                        +---------+---------------+---------+-----------+----------+--------------+ PFV      Full                                                        +---------+---------------+---------+-----------+----------+--------------+ POP      Full           Yes      Yes                                 +---------+---------------+---------+-----------+----------+--------------+ PTV                                                   Not visualized +---------+---------------+---------+-----------+----------+--------------+ PERO                                                  Not visualized +---------+---------------+---------+-----------+----------+--------------+   +---------+---------------+---------+-----------+----------+------------------+ LEFT     CompressibilityPhasicitySpontaneityPropertiesThrombus Aging     +---------+---------------+---------+-----------+----------+------------------+ CFV      Full           Yes      Yes                                     +---------+---------------+---------+-----------+----------+------------------+ SFJ      Full                                                             +---------+---------------+---------+-----------+----------+------------------+  FV Prox  Full                                                            +---------+---------------+---------+-----------+----------+------------------+ FV Mid                  Yes      Yes                  limited                                                                  visualization      +---------+---------------+---------+-----------+----------+------------------+ FV Distal               Yes      Yes                  limited                                                                  visualization      +---------+---------------+---------+-----------+----------+------------------+ PFV      Full                                                            +---------+---------------+---------+-----------+----------+------------------+ POP      Full           Yes      Yes                                     +---------+---------------+---------+-----------+----------+------------------+ PTV                                                   Not visualized     +---------+---------------+---------+-----------+----------+------------------+ PERO                                                  Not visualized     +---------+---------------+---------+-----------+----------+------------------+     Summary: Right: There is no evidence of deep vein thrombosis in the lower extremity. No cystic structure found in the popliteal fossa. Left: There is no evidence of deep vein thrombosis in the lower extremity. However, portions of this examination were limited- see technologist comments above. No cystic structure found in the popliteal fossa.  *See table(s) above for measurements  and observations. Electronically signed by Curt Jews MD on 09/01/2019 at 4:40:52 PM.    Final     (Echo, Carotid, EGD, Colonoscopy, ERCP)     Subjective:   Discharge Exam: Vitals:   09/14/19 2218 09/15/19 0559  BP: 132/62 (!) 145/74  Pulse: 78 86  Resp: 18 18  Temp: 98.6 F (37 C) 98.5 F (36.9 C)  SpO2: 95% 94%   Vitals:   09/14/19 1344 09/14/19 1452 09/14/19 2218 09/15/19 0559  BP: (!) 116/55 127/68 132/62 (!) 145/74  Pulse: 82 76 78 86  Resp:  17 18 18   Temp: 98.3 F (36.8 C) 98.3 F (36.8 C) 98.6 F (37 C) 98.5 F (36.9 C)  TempSrc: Oral  Oral   SpO2: 93% 93% 95% 94%  Weight:      Height:        General: Pt is alert, awake, not in acute distress Cardiovascular: RRR, S1/S2 +, no rubs, no gallops Respiratory: CTA bilaterally, no wheezing, no rhonchi Abdominal: Soft, NT, ND, bowel sounds + Extremities: no edema, no cyanosis    The results of significant diagnostics from this hospitalization (including imaging, microbiology, ancillary and laboratory) are listed below for reference.     Microbiology: Recent Results (from the past 240 hour(s))  Novel Coronavirus, NAA (hospital order; send-out to ref lab)     Status: None   Collection Time: 09/13/19  3:45 PM   Specimen: Nasopharyngeal Swab; Respiratory  Result Value Ref Range Status   SARS-CoV-2, NAA NOT DETECTED NOT DETECTED Final    Comment: (NOTE) This nucleic acid amplification test was developed and its performance characteristics determined by [employer]. Nucleic acid amplification tests include PCR and TMA. This test has not been FDA cleared or approved. This test has been authorized by FDA under an Emergency Use Authorization (EUA). This test is only authorized for the duration of time the declaration that circumstances exist justifying the authorization of the emergency use of in vitro diagnostic tests for detection of SARS-CoV-2 virus and/or diagnosis of COVID-19 infection under section 564(b)(1) of the Act, 21 U.S.C. [id number]) (1), unless the authorization is terminated or revoked sooner. When diagnostic testing is  negative, the possibility of a false negative result should be considered in the context of a patient's recent exposures and the presence of clinical signs and symptoms consistent with COVID-19. An individual without symptoms of COVID- 19 and who is not shedding SARS-CoV-2 vi rus would expect to have a negative (not detected) result in this assay. Performed At: [facility] [address] [place], [place] [id number] Rush Farmer MD [id number]    [place]  Final    Comment: Performed at [facility], [place] [address], [place], [place] [address]     Labs: BNP (last 3 results) No results for input(s): BNP in the last 8760 hours. Basic Metabolic Panel: Recent Labs  Lab 09/10/19 0234 09/10/19 2045 09/11/19 0251 09/14/19 0245 09/15/19 0302  NA 138 139 138 141 139  K 2.8* 4.2 3.9 3.2* 3.6  CL 108 108 109 106 105  CO2 23 24 24 25 25   GLUCOSE 95 168* 102* 102* 97  BUN 15 13 13 19 14   CREATININE 1.04* 1.03* 0.96 0.87 0.81  CALCIUM 8.1* 7.9* 8.0* 8.5* 8.6*   Liver Function Tests: No results for input(s): AST, ALT, ALKPHOS, BILITOT, PROT, ALBUMIN in the last 168 hours. No results for input(s): LIPASE, AMYLASE in the last 168 hours. No results for input(s): AMMONIA in the last  168 hours. CBC: Recent Labs  Lab 09/14/19 0245  WBC 12.3*  NEUTROABS 8.9*  HGB 10.6*  HCT 32.1*  MCV 89.4  PLT 413*   Cardiac Enzymes: Recent Labs  Lab 09/11/19 0251 09/12/19 0247 09/13/19 0426 09/14/19 0245 09/15/19 0302  CKTOTAL 132 96 91 60 41   BNP: Invalid input(s): POCBNP CBG: No results for input(s): GLUCAP in the last 168 hours. D-Dimer No results for input(s): DDIMER in the last 72 hours. Hgb A1c No results for input(s): HGBA1C in the last 72 hours. Lipid Profile No results for input(s): CHOL, HDL, LDLCALC, TRIG, CHOLHDL, LDLDIRECT in the last 72 hours. Thyroid function studies No results for input(s): TSH, T4TOTAL, T3FREE, THYROIDAB in  the last 72 hours.  Invalid input(s): FREET3 Anemia work up No results for input(s): VITAMINB12, FOLATE, FERRITIN, TIBC, IRON, RETICCTPCT in the last 72 hours. Urinalysis    Component Value Date/Time   COLORURINE YELLOW 08/31/2019 0745   APPEARANCEUR HAZY (A) 08/31/2019 0745   LABSPEC 1.013 08/31/2019 0745   PHURINE 5.0 08/31/2019 0745   GLUCOSEU NEGATIVE 08/31/2019 0745   HGBUR LARGE (A) 08/31/2019 0745   HGBUR negative 04/24/2009 1635   BILIRUBINUR NEGATIVE 08/31/2019 0745   BILIRUBINUR negative 05/29/2011 1439   KETONESUR NEGATIVE 08/31/2019 0745   PROTEINUR 30 (A) 08/31/2019 0745   UROBILINOGEN 0.2 01/26/2014 1912   NITRITE NEGATIVE 08/31/2019 0745   LEUKOCYTESUR SMALL (A) 08/31/2019 0745   Sepsis Labs Invalid input(s): PROCALCITONIN,  WBC,  LACTICIDVEN Microbiology Recent Results (from the past 240 hour(s))  Novel Coronavirus, NAA (hospital order; send-out to ref lab)     Status: None   Collection Time: 09/13/19  3:45 PM   Specimen: Nasopharyngeal Swab; Respiratory  Result Value Ref Range Status   SARS-CoV-2, NAA NOT DETECTED NOT DETECTED Final    Comment: (NOTE) This nucleic acid amplification test was developed and its performance characteristics determined by [employer]. Nucleic acid amplification tests include PCR and TMA. This test has not been FDA cleared or approved. This test has been authorized by FDA under an Emergency Use Authorization (EUA). This test is only authorized for the duration of time the declaration that circumstances exist justifying the authorization of the emergency use of in vitro diagnostic tests for detection of SARS-CoV-2 virus and/or diagnosis of COVID-19 infection under section 564(b)(1) of the Act, 21 U.S.C. [id number]) (1), unless the authorization is terminated or revoked sooner. When diagnostic testing is negative, the possibility of a false negative result should be considered in the context of a patient's recent  exposures and the presence of clinical signs and symptoms consistent with COVID-19. An individual without symptoms of COVID- 19 and who is not shedding SARS-CoV-2 vi rus would expect to have a negative (not detected) result in this assay. Performed At: [facility] [address] [place], [place] [id number] Rush Farmer MD [id number]    [place]  Final    Comment: Performed at [facility], [place] [address] [place], [place] [address]     Time coordinating discharge: Over 30 minutes  SIGNED:   Yaakov Guthrie, MD  [facility] 09/15/2019, 9:19 AM Pager on amion  If 7PM-7AM, please contact night-coverage [email or web address] Password [email or web address]

## 2019-09-15 NOTE — TOC Transition Note (Addendum)
Transition of Care [facility]) - CM/SW Discharge Note   Patient Details  Name: Angelica Bell MRN: [id number] Date of Birth: 1932/11/27  Transition of Care [facility]) CM/SW Contact:  Benard Halsted, LCSW Phone Number: 09/15/2019, 9:31 AM   Clinical Narrative:    Patient will DC to: [facility] Anticipated DC date: 09/15/19 Family notified: Haynes Bast [name]) Transport by: Corey Harold    Per MD patient ready for DC to [facility]. RN, patient, patient's family, and facility notified of DC. Discharge Summary and FL2 sent to facility. RN to call report prior to discharge [phone] Room 119). DC packet on chart. Ambulance transport requested for patient.   CSW will sign off for now as social work intervention is no longer needed. Please consult [place] again if new needs arise.  Cedric Fishman, LCSW Clinical Social Worker [phone]    Final next level of care: Skilled Nursing Facility Barriers to Discharge: No Barriers Identified   Patient Goals and CMS Choice Patient states their goals for this hospitalization and ongoing recovery are:: Pt grandson is agreeable to SNF CMS Medicare.gov Compare Post Acute Care list provided to:: Patient Represenative (must comment) Choice offered to / list presented to : [facility] POA / Guardian(Pt grandson)  Discharge Placement   Existing PASRR number confirmed : 09/15/19          Patient chooses bed at: [place] Patient to be transferred to facility by: [place] Name of family member notified: Haynes Bast [name]) Patient and family notified of of transfer: 09/15/19  Discharge Plan and Services In-house Referral: Clinical Social Work Discharge Planning Services: NA Post Acute Care Choice: [place]          DME Arranged: N/A DME Agency: NA       HH Arranged: NA HH Agency: NA        Social Determinants of Health (SDOH) Interventions     Readmission Risk Interventions No flowsheet data found.

## 2019-09-15 NOTE — Progress Notes (Signed)
Pt called this nurse to the room.  Pt states that she has heard bad things about the facility that she is scheduled to be discharged to from "a friend," whose name she could not recall.  She is stating that she does not want to be discharged to this facility.  I told the patient that I would inform her nurse of her concerns, as well as the dayshift team so that they could better address her concerns.

## 2019-09-15 NOTE — Progress Notes (Signed)
Pt prepared for d/c to SNF. IV d/c'd. Foley in place, clean & intact. Skin intact except as charted in most recent assessments. Vitals are stable. Report called to receiving facility. Pt to be transported by ambulance service.

## 2019-09-22 IMAGING — CR DG HIP (WITH OR WITHOUT PELVIS) 2-3V*L*
3 series · 3 of 3 positions shown · non-contrast
Comparison: 07/29/2017

CLINICAL DATA: Recent fall with hip pain, initial encounter

EXAM:
DG HIP (WITH OR WITHOUT PELVIS) 2-3V LEFT

[x pelvis]
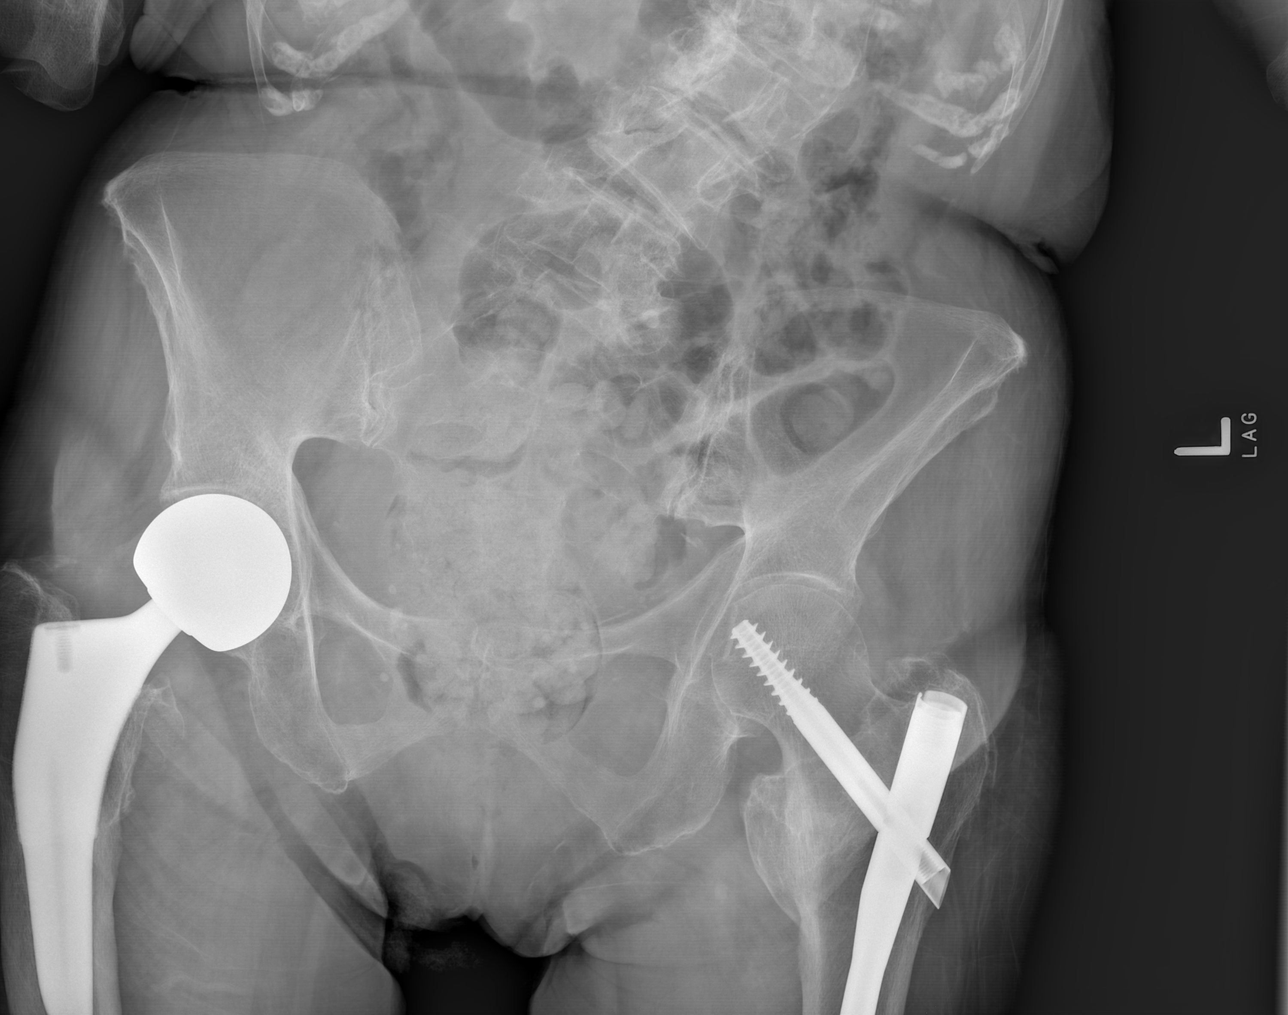

[x hip ap left]
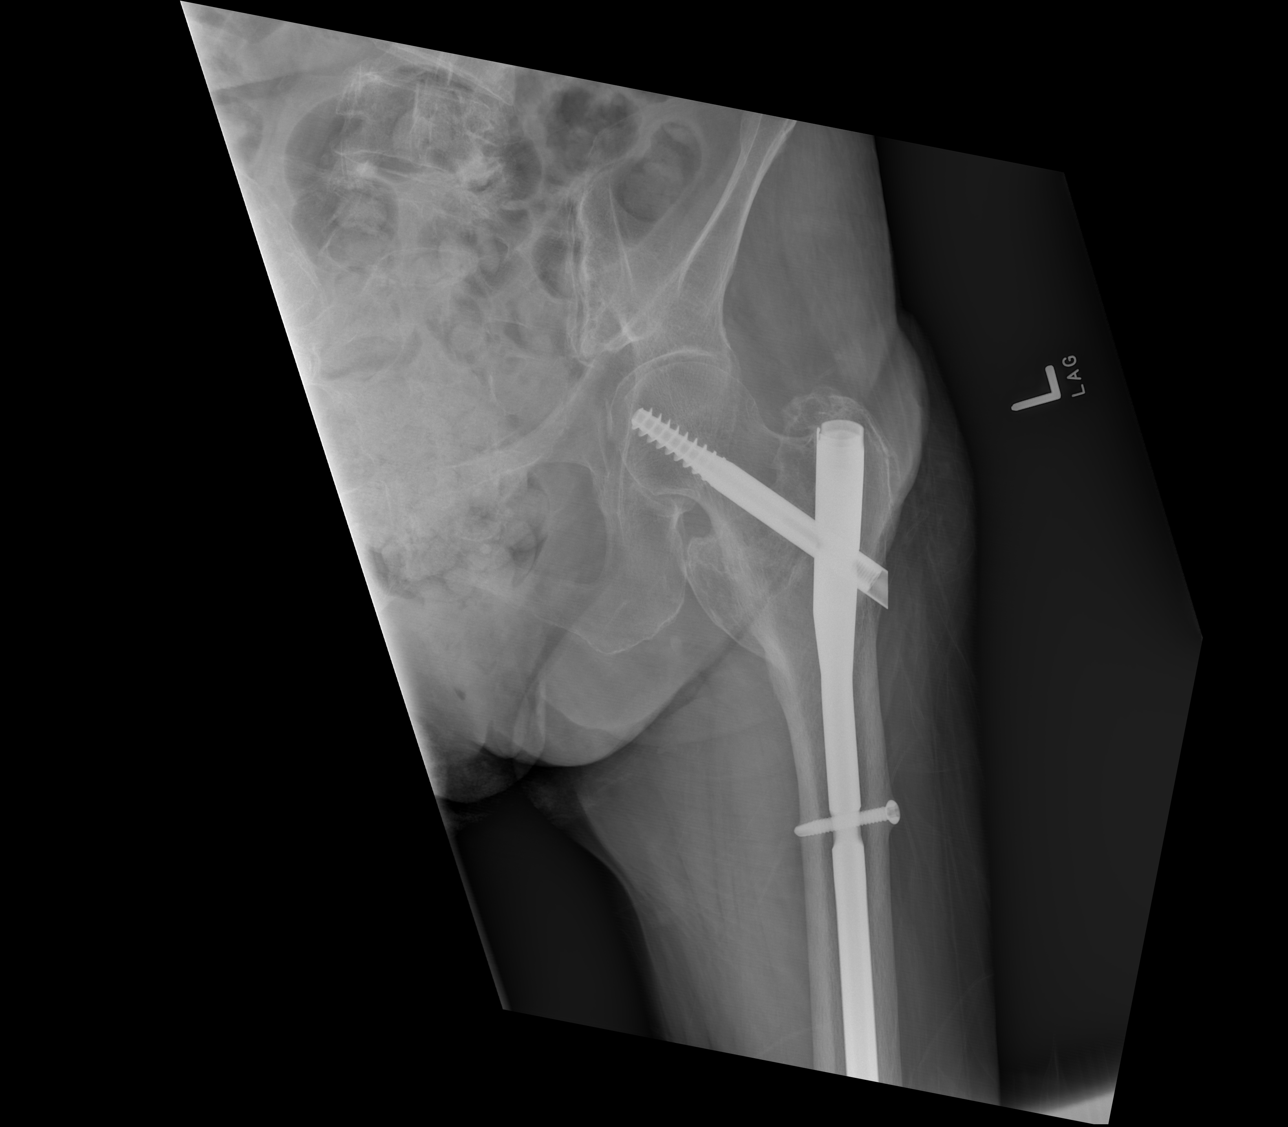

[x hip lat left]
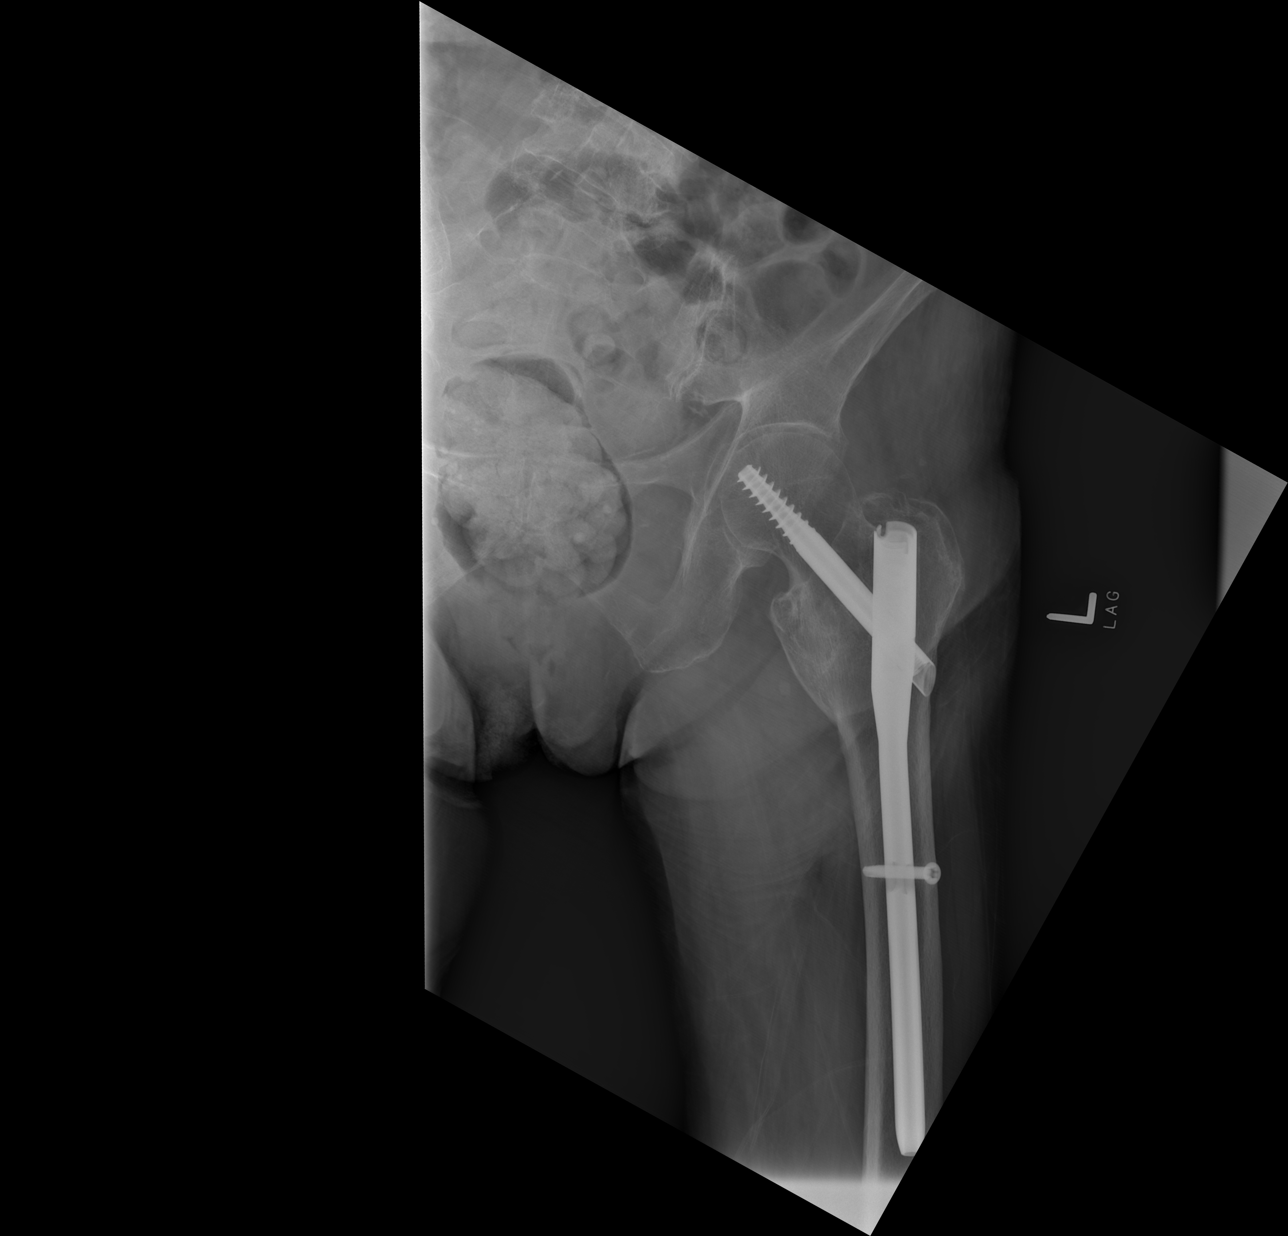

[3 of 3 positions shown; findings below may reference images not displayed]

FINDINGS: Pelvic ring is intact. Postsurgical changes in the proximal femurs
are noted bilaterally. Degenerative changes and scoliosis of the
lumbar spine is seen. No fracture or dislocation is noted.
IMPRESSION: Postsurgical changes without acute abnormality.

## 2019-09-22 IMAGING — CR DG LUMBAR SPINE COMPLETE 4+V
5 series · 5 of 5 positions shown · non-contrast
Comparison: 01/18/2014 and 02/16/2017

CLINICAL DATA: Pain after fall

EXAM:
LUMBAR SPINE - COMPLETE 4+ VIEW

[t lumbar spine ap]
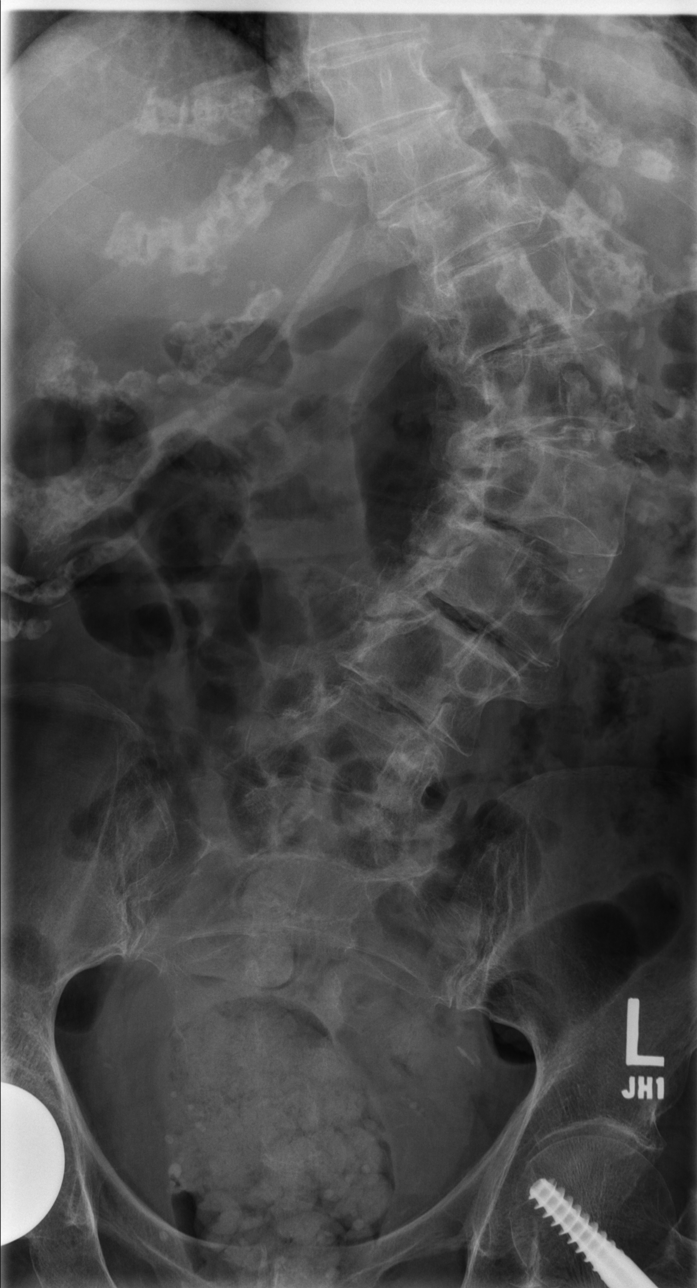

[t lumbar spine obl (1 of 2)]
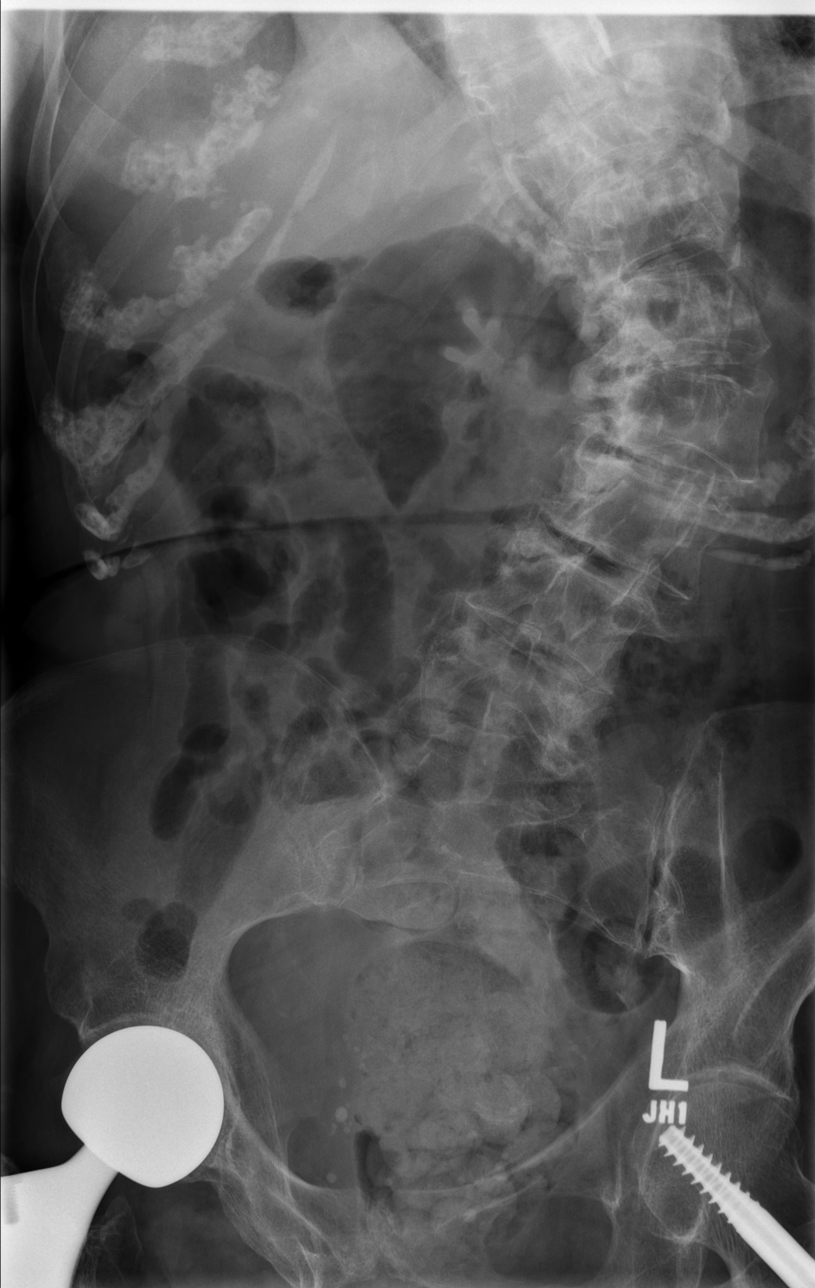

[t lumbar spine obl (2 of 2)]
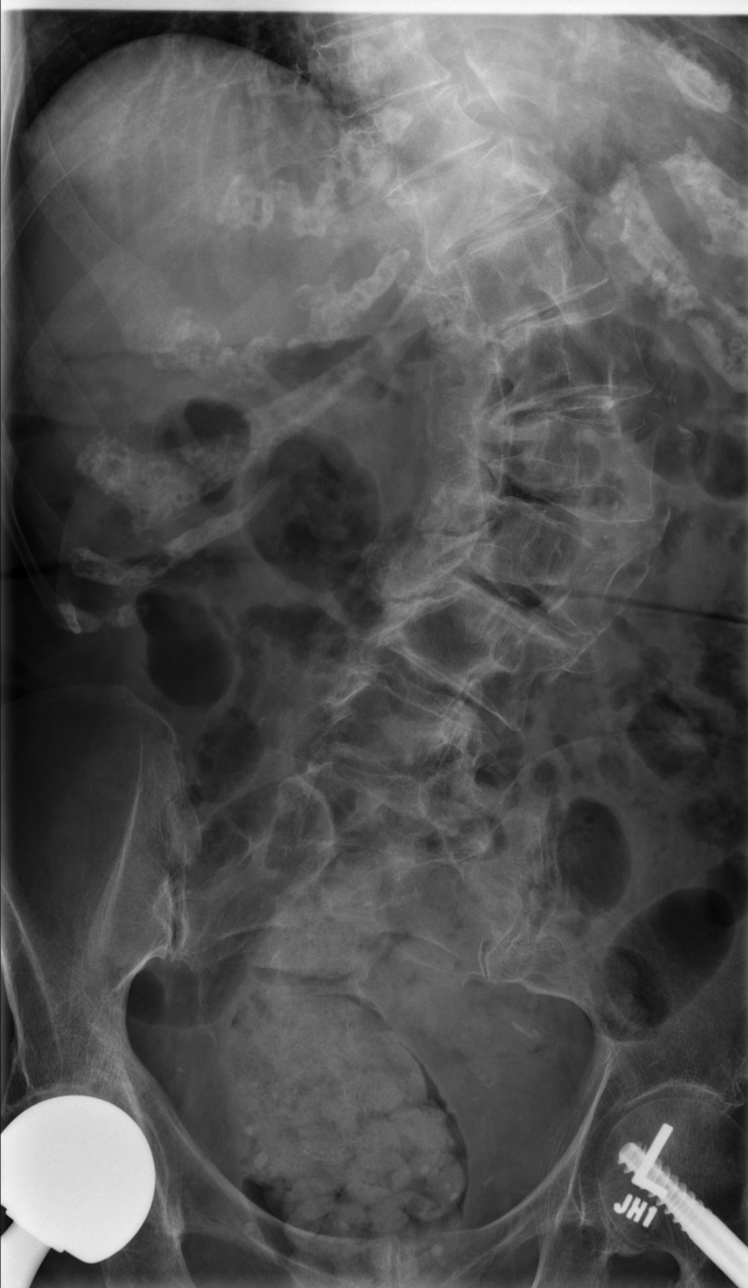

[t lumbar spine lat]
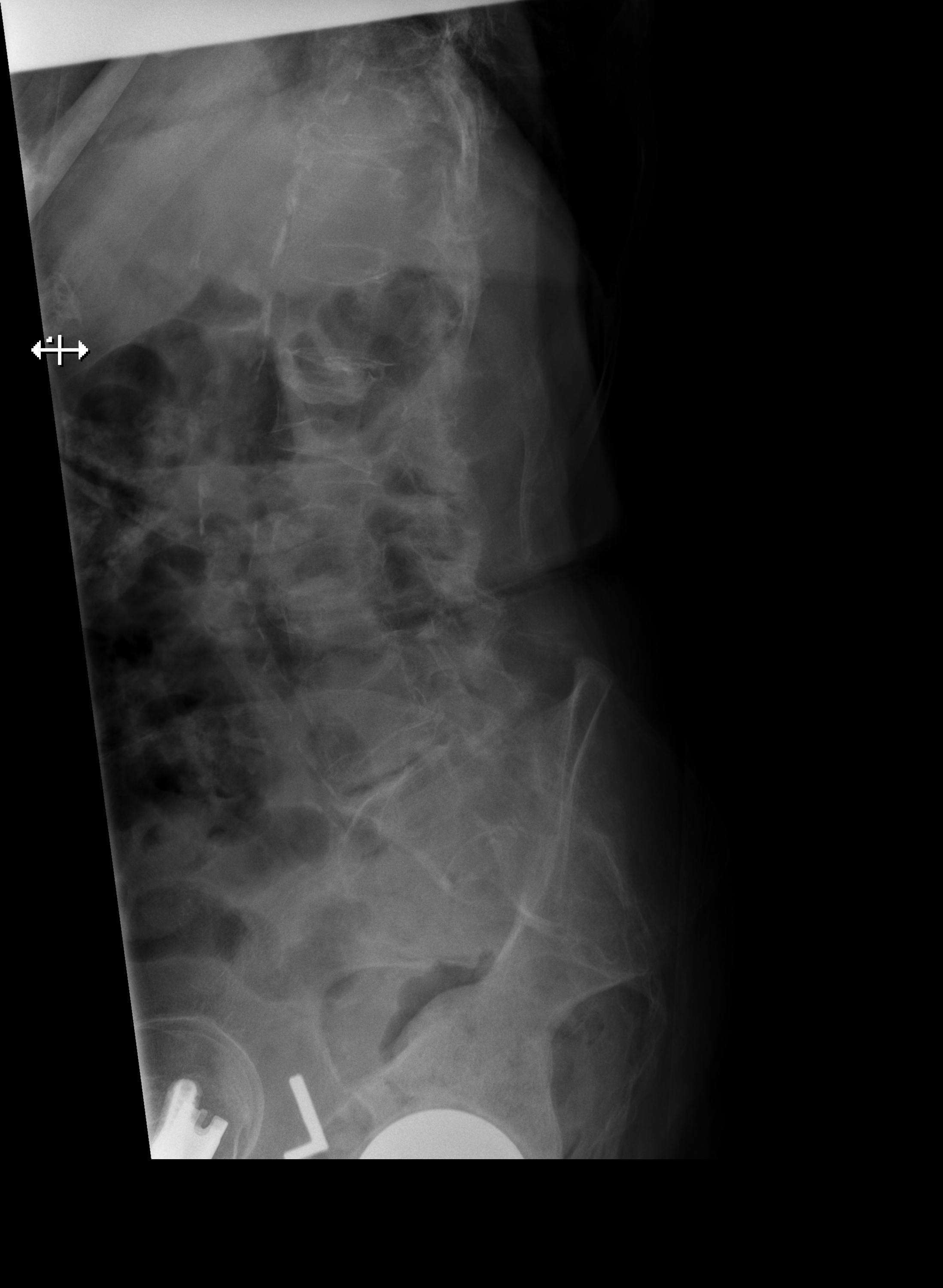

[t lumbar l-5 s-1 spot]
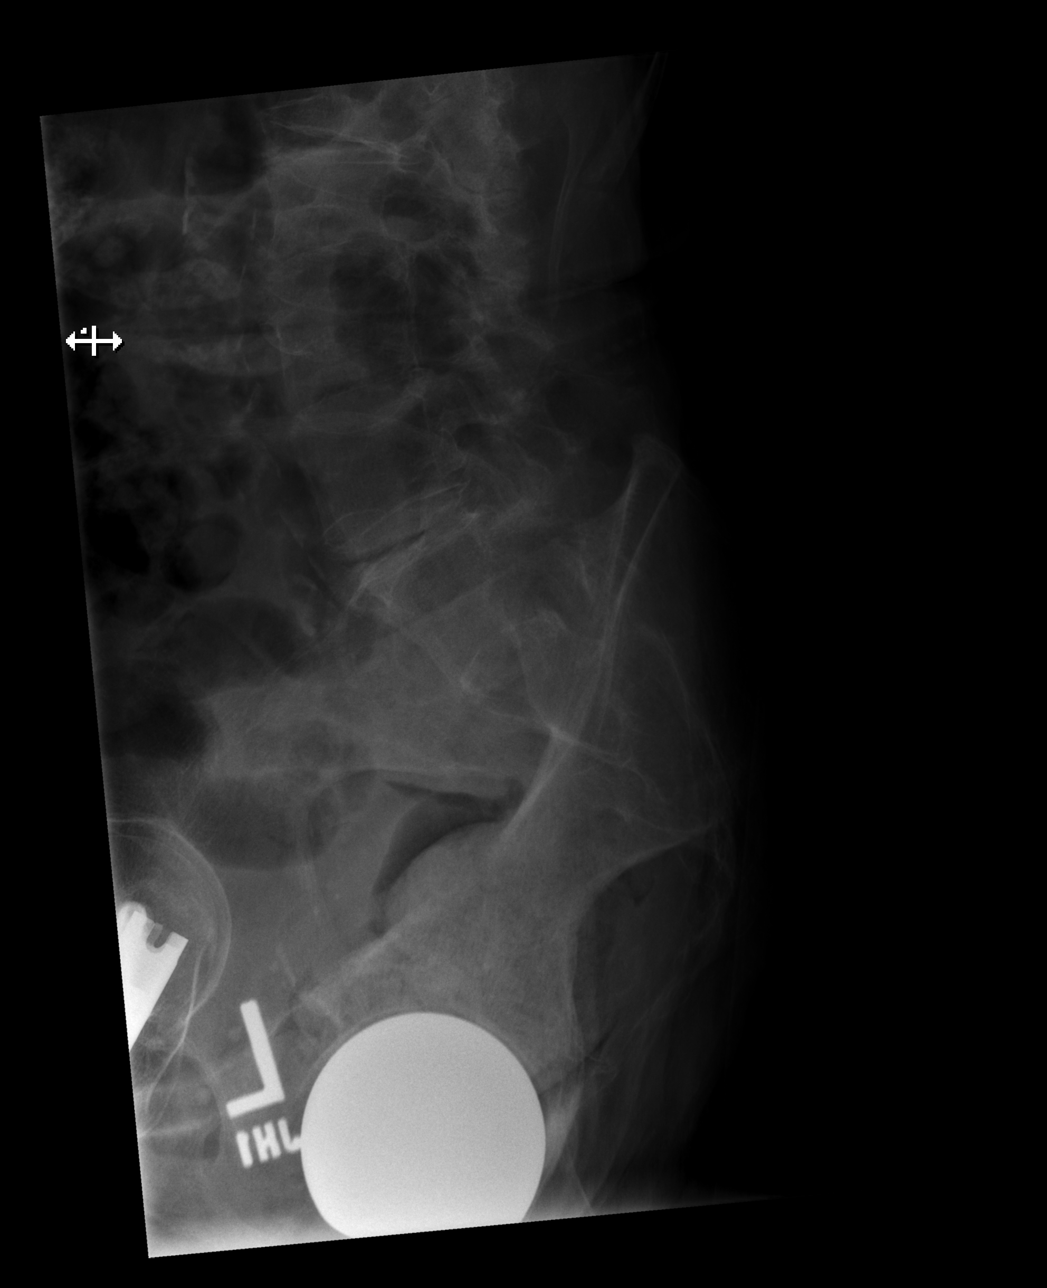

[5 of 5 positions shown; findings below may reference images not displayed]

FINDINGS: Levoconvex curvature of the lumbar spine with apex at approximately
L2 is seen. Subtle increase in height loss of the T11 vertebral body
involving the superior endplate is identified which may represent an
acute on chronic fracture. This is better visualized than on the
thoracic spine radiographs. If there is pain correlated to this
level, consider cross-sectional imaging for further detail.
Partially included right hip arthroplasty and left femoral nail
fixation hardware are identified. No pelvic diastasis to the extent
visualized. Multilevel degenerative facet arthropathy is seen.
IMPRESSION: Subtle increase in height loss of the T11 vertebral body involving
the superior endplate which may represent an acute on chronic
fracture. If there is pain correlated to this level, consider
cross-sectional imaging for further detail.

Stable levoconvex curvature of the lumbar spine.

Lumbar spondylosis with multilevel degenerative disc and facet
arthropathy.

## 2019-09-22 IMAGING — CR DG THORACIC SPINE 2V
3 series · 3 of 3 positions shown · non-contrast
Comparison: CXR 04/22/2018 and 02/16/2017

CLINICAL DATA: Patient fell 24 hours ago and presents with left
thigh and lower back pain. Patient noted to be incontinent.

EXAM:
THORACIC SPINE 2 VIEWS

[t thoracic spine ap]
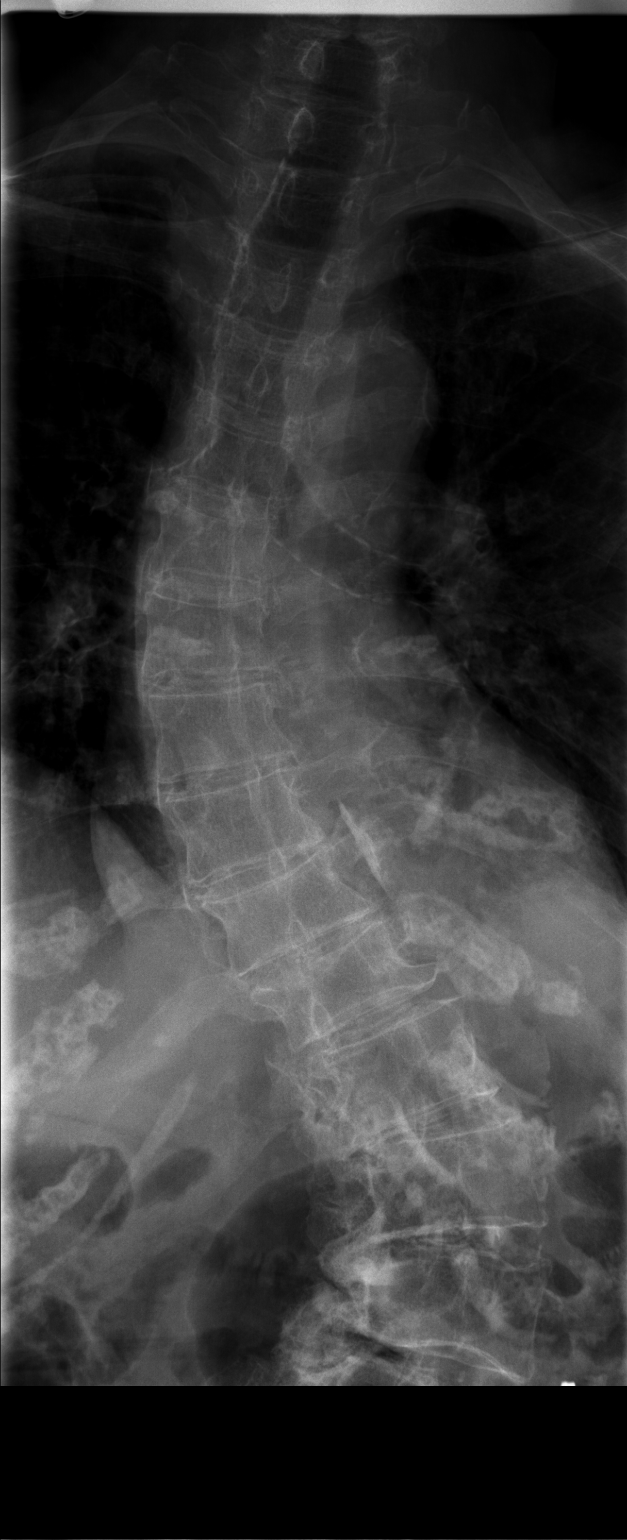

[t thoracic spine lat]
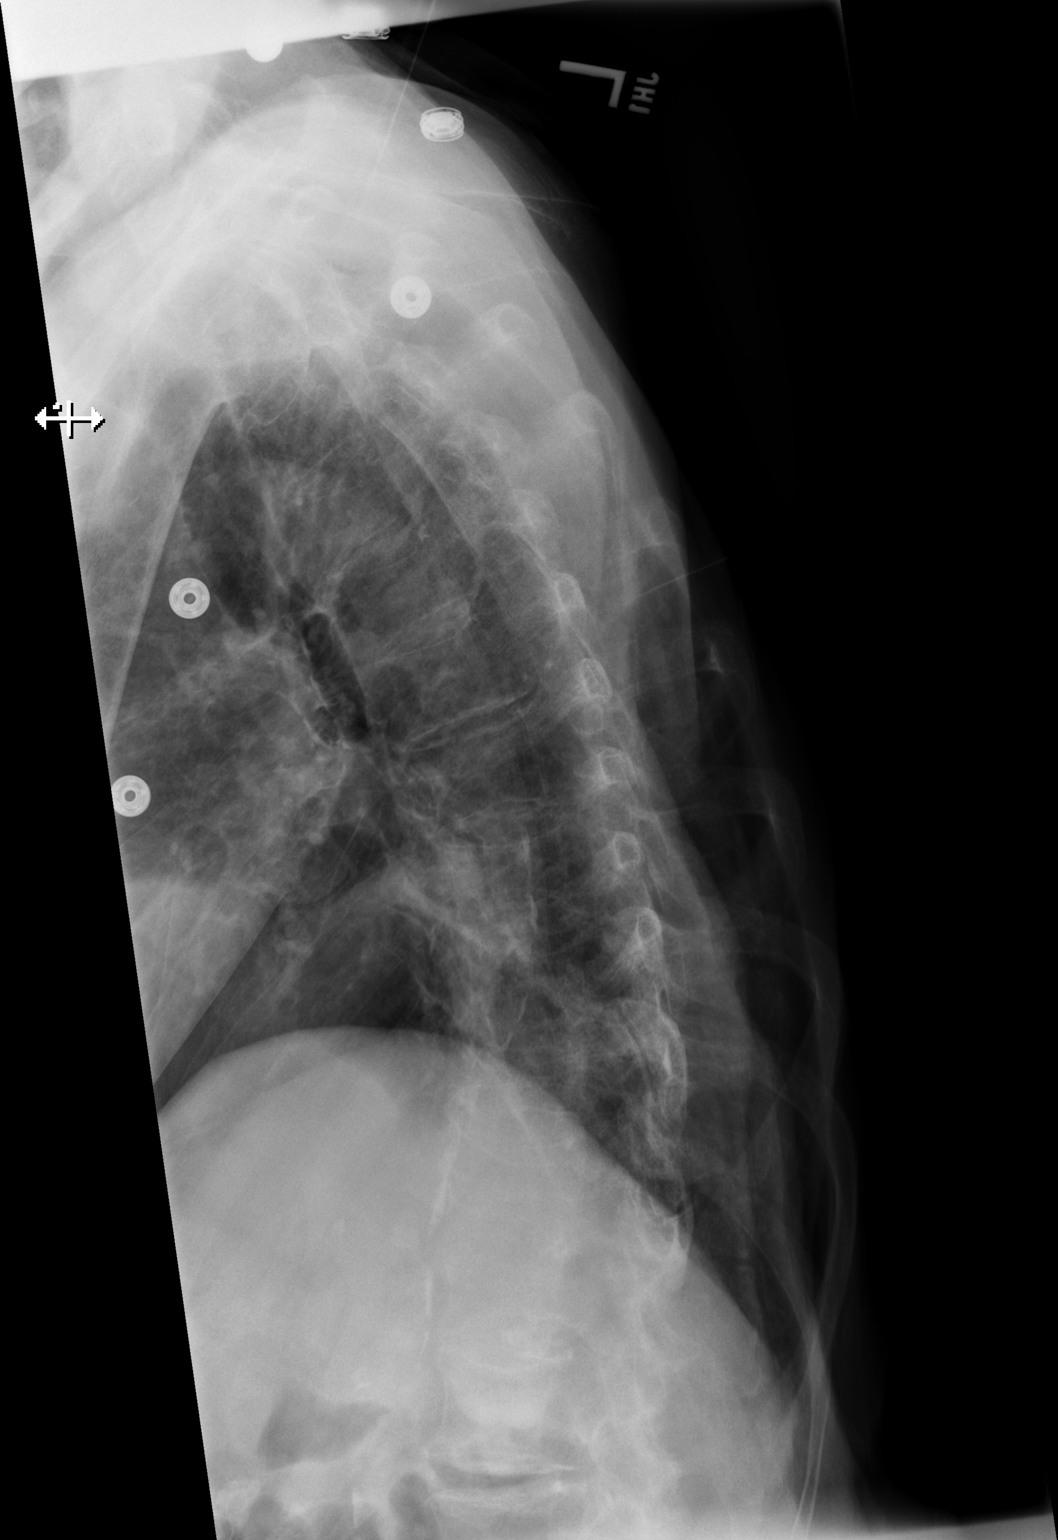

[t thoracic swimmers]
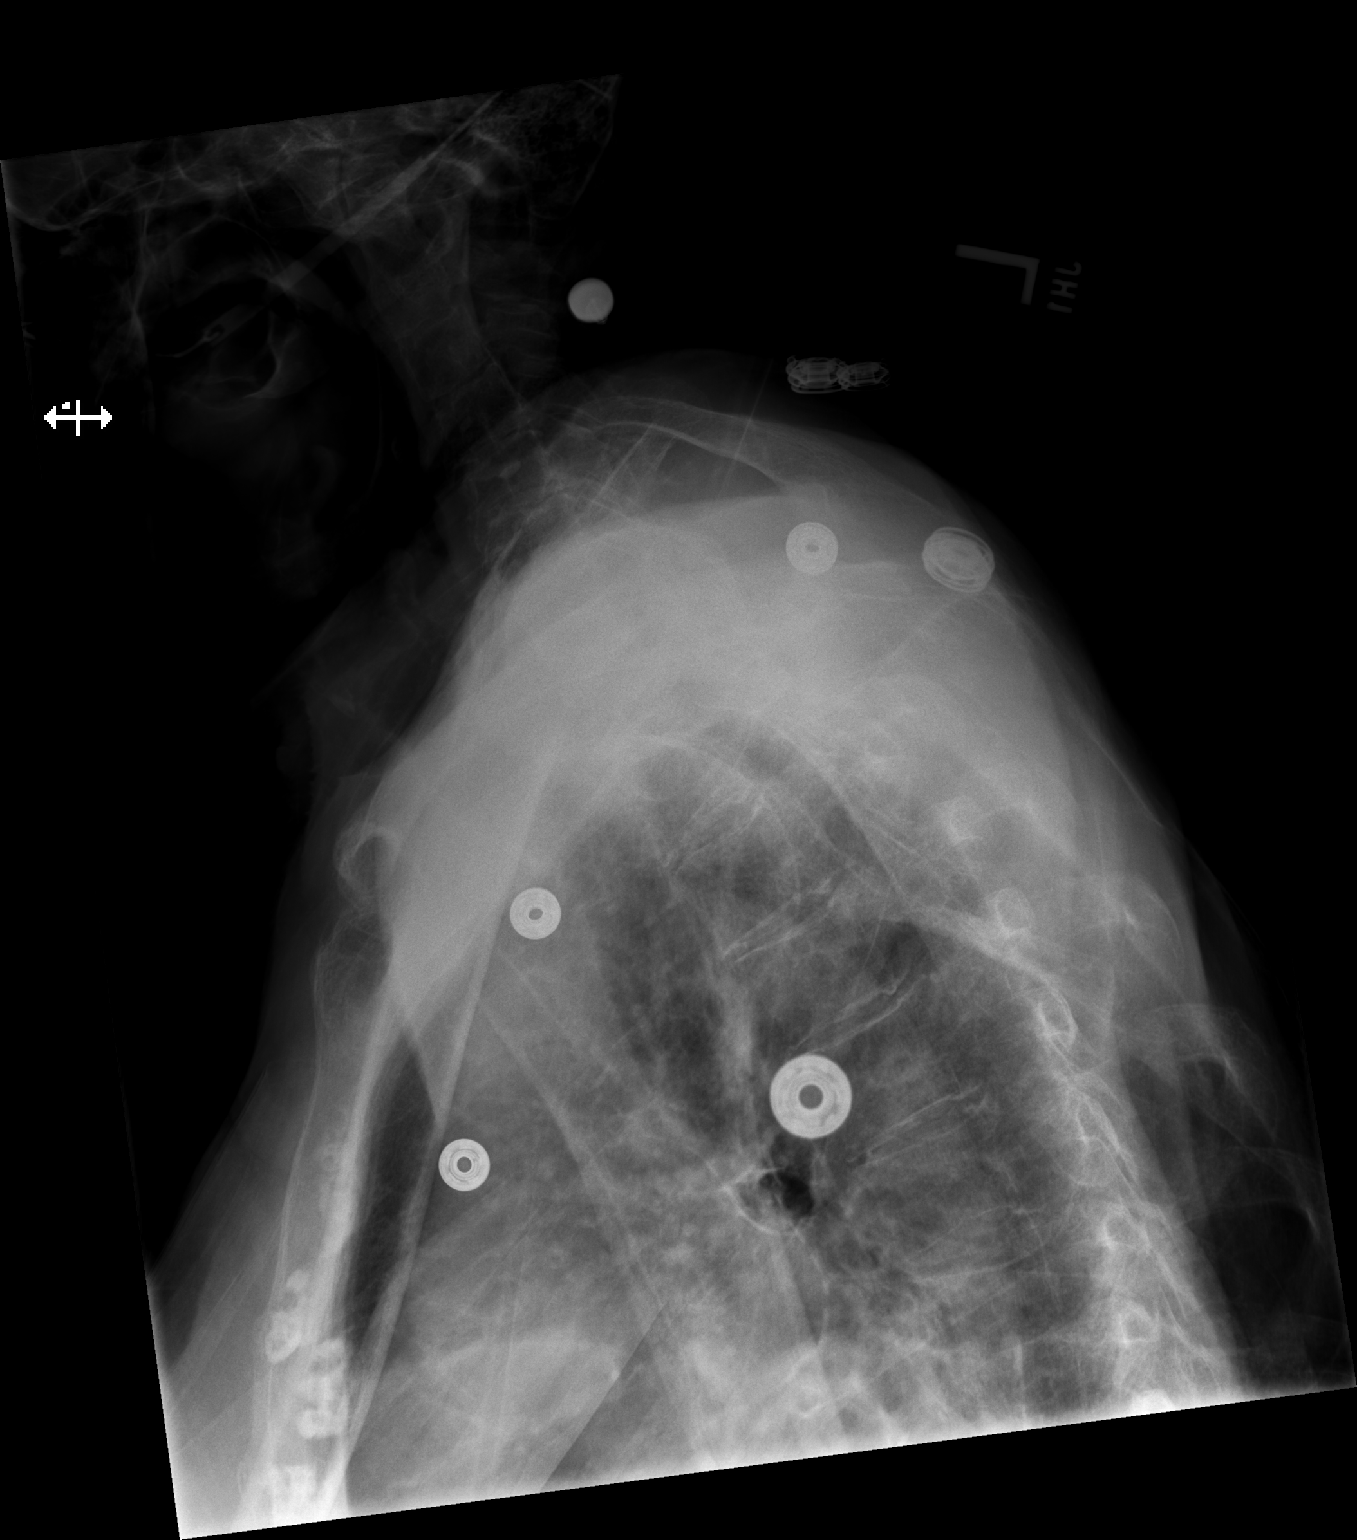

[3 of 3 positions shown; findings below may reference images not displayed]

FINDINGS: Stable S-shaped scoliosis of the thoracolumbar spine with
dextroconvex curvature of the thoracic spine and apex at T7.
Levoscoliosis of the lumbar spine with apex at the L2-3 disc space
is noted. Chronic moderate T12 compression deformity is stable.
Multilevel disc flattening due to the scoliosis is noted along the
thoracolumbar spine. No acute osseous appearing abnormality.
IMPRESSION: 1. No acute osseous appearing abnormality of the thoracic spine. If
the patient has pain out of proportion to radiographic findings,
dedicated CT of the affected area of the dorsal spine may help
detect radiographically occult fractures.
2. Chronic stable moderate T12 compression deformity dating back to
02/16/2017 chest radiograph is suggested.
3. Stable S-shaped scoliosis of the thoracolumbar spine.

## 2019-09-22 IMAGING — CT CT HEAD W/O CM
5 of 7 series · 16 of 47 positions shown, 17 images · non-contrast
Comparison: None.

CLINICAL DATA: Headache after fall

EXAM:
CT HEAD WITHOUT CONTRAST
CT CERVICAL SPINE WITHOUT CONTRAST
TECHNIQUE: Multidetector CT imaging of the head and cervical spine was
performed following the standard protocol without intravenous
contrast. Multiplanar CT image reconstructions of the cervical spine
were also generated.

[Series 3: head wo · axial · 0.47mm/px · z∈[+343,+393]mm · 2 of 31 slices shown, 3 images]
[im 11/31  brain]
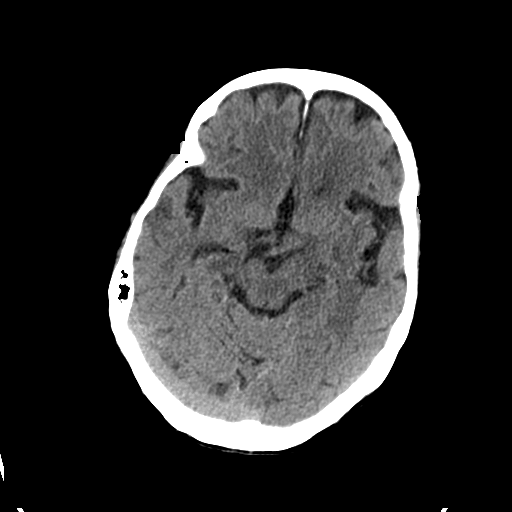
[im 11/31  bone]
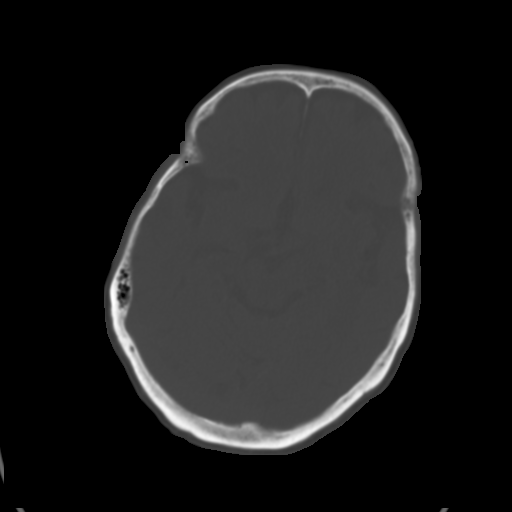
[im 21/31  brain]
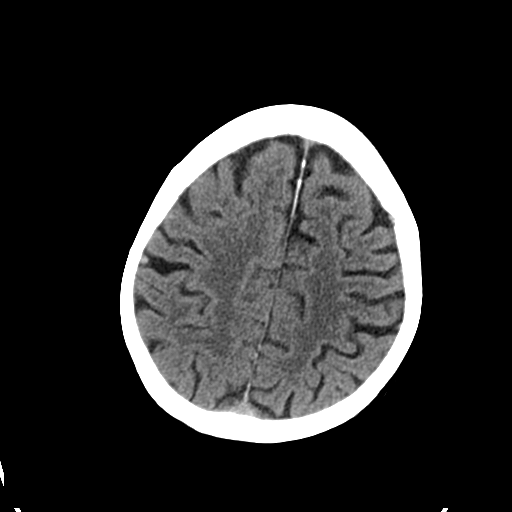

[Series 7: sagittal soft tissue · sagittal · 0.30mm/px · 1 of 57 slices shown]
[im 29/57  brain]
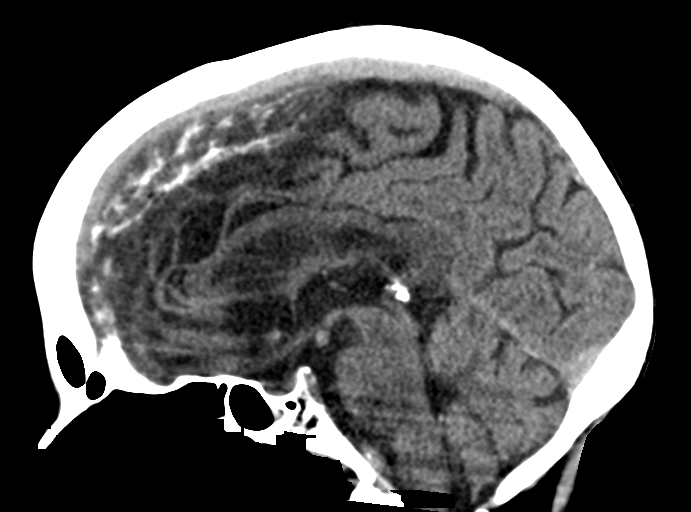

[Series 9: c spine soft · axial · 0.32mm/px · z∈[+166,+180]mm · 2 of 75 slices shown]
[im 7/75  brain]
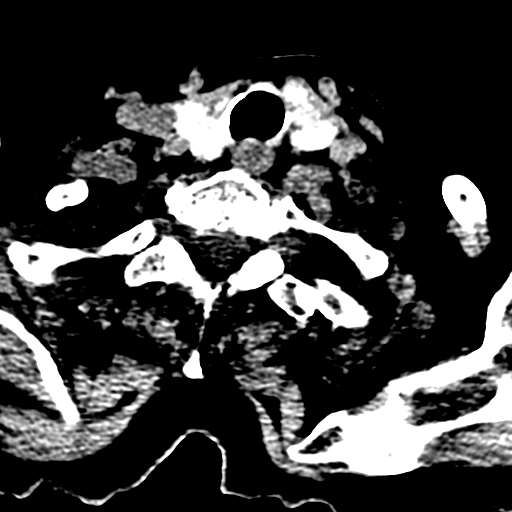
[im 14/75  brain]
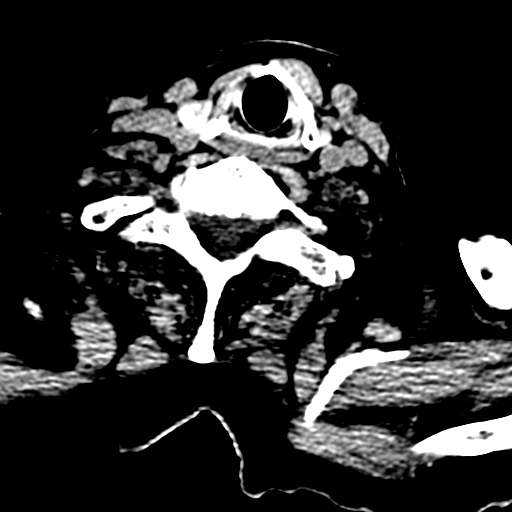

[Series 11: orthogonal bone · axial · 0.23mm/px · z∈[+146,+290]mm · 8 of 88 slices shown]
[im 7/88  bone]
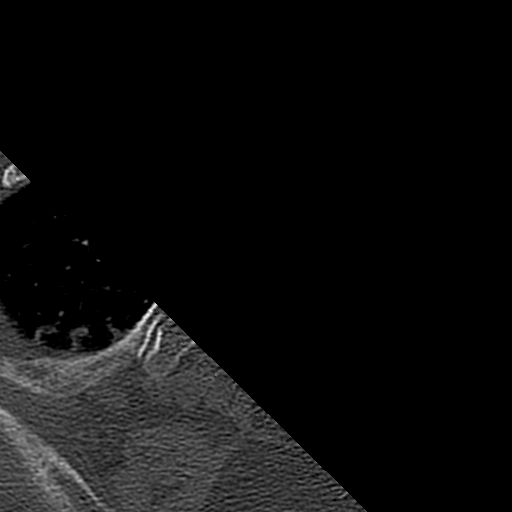
[im 21/88  bone]
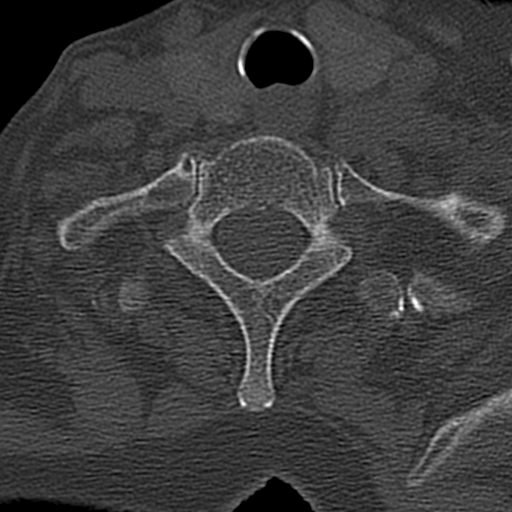
[im 27/88  bone]
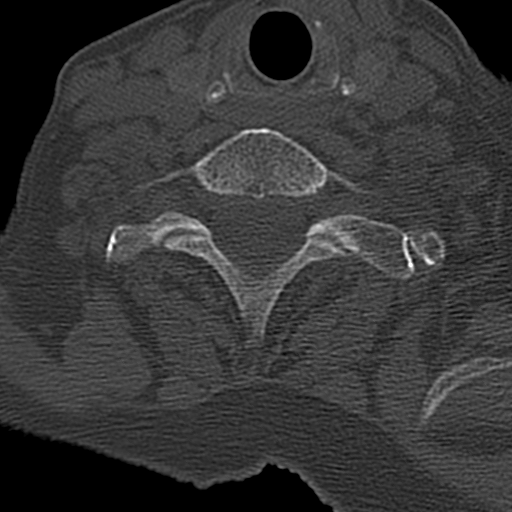
[im 41/88  bone]
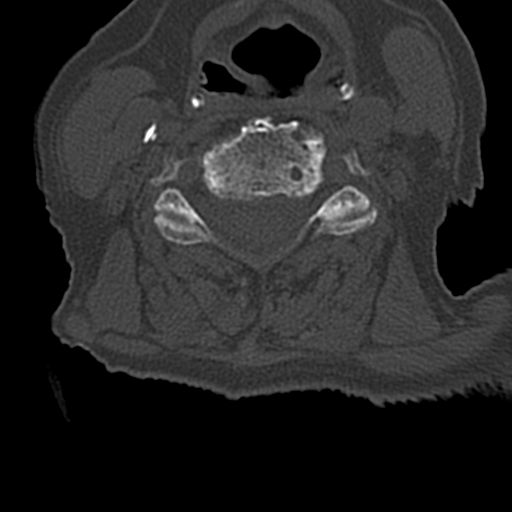
[im 47/88  bone]
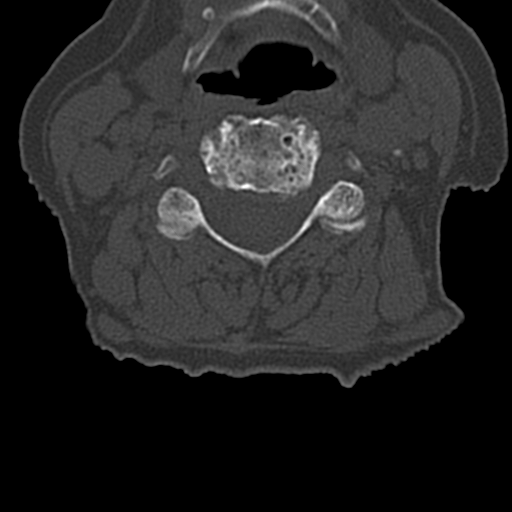
[im 61/88  bone]
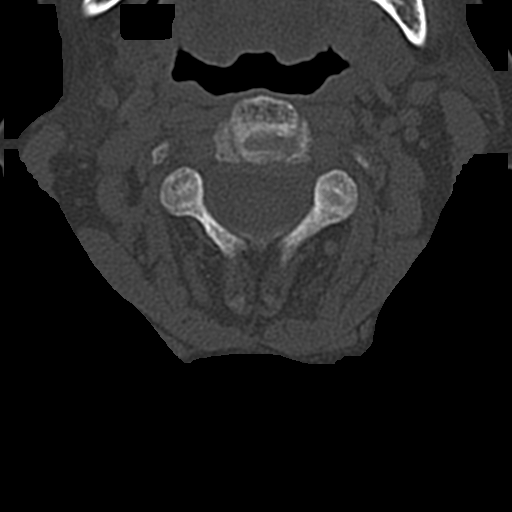
[im 67/88  bone]
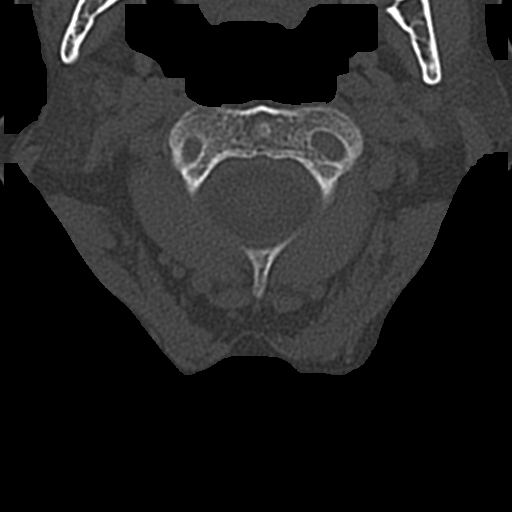
[im 81/88  bone]
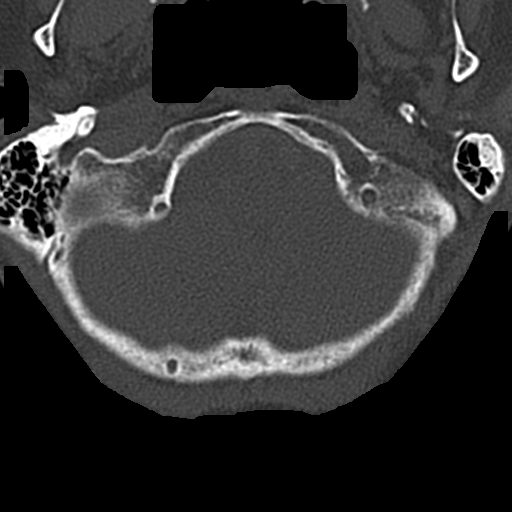

[Series 12: coronal bone · coronal · 0.23mm/px · 3 of 61 slices shown]
[im 18/61  brain]
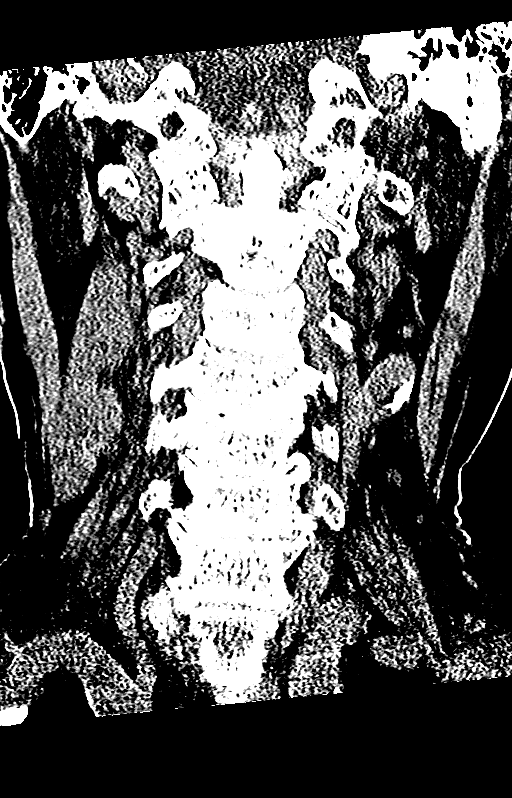
[im 26/61  brain]
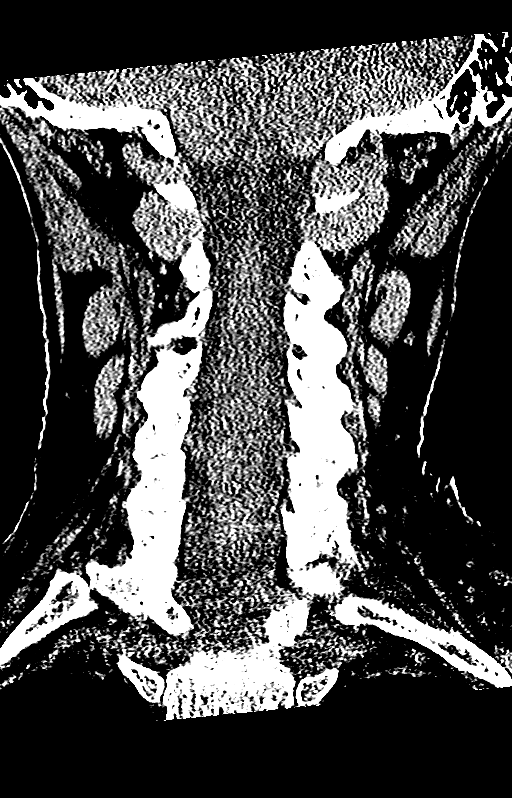
[im 35/61  brain]
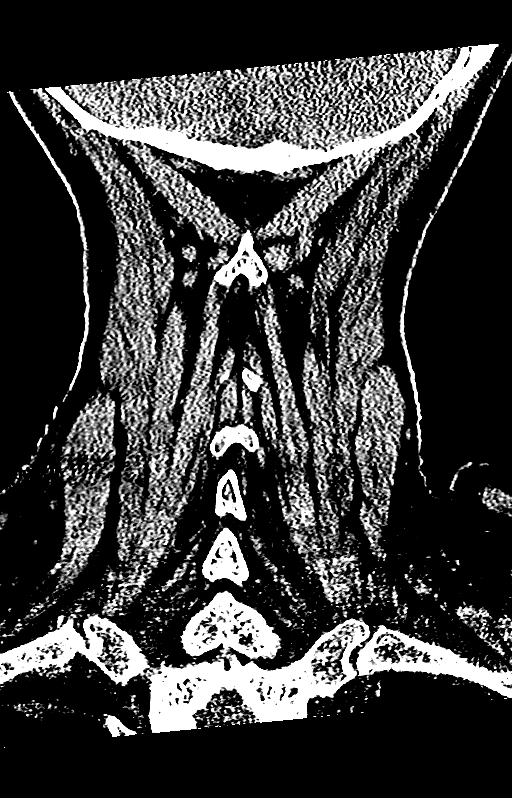

[16 of 47 positions shown; findings below may reference images not displayed]

FINDINGS: CT HEAD FINDINGS

BRAIN: There is mild sulcal and ventricular prominence consistent
with age related involutional change and atrophy. No
intraparenchymal hemorrhage, mass effect nor midline shift.
Periventricular and subcortical white matter hypodensities
consistent with chronic small vessel ischemic disease are
identified. No acute large vascular territory infarcts. No abnormal
extra-axial fluid collections. Basal cisterns are not effaced and
midline.

VASCULAR: Moderate calcific atherosclerosis of the carotid siphons.

SKULL: No skull fracture. No significant scalp soft tissue swelling.

SINUSES/ORBITS: The mastoid air-cells are clear. The included
paranasal sinuses are well-aerated.The included ocular globes and
orbital contents are non-suspicious. Bilateral lens replacements.

OTHER: None.

CT CERVICAL SPINE FINDINGS

Alignment: Slight straightening of cervical lordosis.

Skull base and vertebrae: No acute fracture. No primary bone lesion
or focal pathologic process.

Soft tissues and spinal canal: No prevertebral fluid or swelling. No
visible canal hematoma.

Disc levels: Moderate to marked disc flattening C2 through C7
greatest at C4-5 and C5-6 with small posterior marginal osteophytes.
No significant central or foraminal encroachment. No jumped or
perched facets. Uncovertebral joint spurring osteoarthritis is
identified from C4 through C7 bilaterally.

Upper chest: Biapical pleuroparenchymal scarring.

Other: Extracranial carotid arteriosclerosis. Vertebral
arteriosclerosis.
IMPRESSION: 1. Atrophy with chronic small vessel ischemic disease. No acute
intracranial abnormality.
2. Cervical spondylosis without acute posttraumatic cervical spine
fracture or subluxation.

## 2020-02-05 ENCOUNTER — Emergency Department (HOSPITAL_COMMUNITY): Payer: Medicare Other

## 2020-02-05 ENCOUNTER — Other Ambulatory Visit: Payer: Self-pay

## 2020-02-05 ENCOUNTER — Encounter (HOSPITAL_COMMUNITY): Payer: Self-pay | Admitting: Emergency Medicine

## 2020-02-05 ENCOUNTER — Inpatient Hospital Stay (HOSPITAL_COMMUNITY)
Admission: EM | Admit: 2020-02-05 | Discharge: 2020-02-08 | DRG: 683 | Disposition: A | Discharge status: Skilled Nursing Facility | Payer: Medicare Other | Attending: Internal Medicine | Admitting: Internal Medicine

## 2020-02-05 DIAGNOSIS — R748 Abnormal levels of other serum enzymes: Secondary | ICD-10-CM

## 2020-02-05 DIAGNOSIS — N183 Chronic kidney disease, stage 3 unspecified: Secondary | ICD-10-CM | POA: Diagnosis present

## 2020-02-05 DIAGNOSIS — Z9181 History of falling: Secondary | ICD-10-CM

## 2020-02-05 DIAGNOSIS — T796XXD Traumatic ischemia of muscle, subsequent encounter: Secondary | ICD-10-CM

## 2020-02-05 DIAGNOSIS — I10 Essential (primary) hypertension: Secondary | ICD-10-CM | POA: Diagnosis present

## 2020-02-05 DIAGNOSIS — N179 Acute kidney failure, unspecified: Secondary | ICD-10-CM | POA: Diagnosis not present

## 2020-02-05 DIAGNOSIS — E785 Hyperlipidemia, unspecified: Secondary | ICD-10-CM | POA: Diagnosis present

## 2020-02-05 DIAGNOSIS — R296 Repeated falls: Secondary | ICD-10-CM | POA: Diagnosis present

## 2020-02-05 DIAGNOSIS — Z86711 Personal history of pulmonary embolism: Secondary | ICD-10-CM

## 2020-02-05 DIAGNOSIS — I129 Hypertensive chronic kidney disease with stage 1 through stage 4 chronic kidney disease, or unspecified chronic kidney disease: Secondary | ICD-10-CM | POA: Diagnosis present

## 2020-02-05 DIAGNOSIS — Z66 Do not resuscitate: Secondary | ICD-10-CM | POA: Diagnosis present

## 2020-02-05 DIAGNOSIS — M6282 Rhabdomyolysis: Secondary | ICD-10-CM | POA: Diagnosis present

## 2020-02-05 DIAGNOSIS — G8929 Other chronic pain: Secondary | ICD-10-CM | POA: Diagnosis present

## 2020-02-05 DIAGNOSIS — Z79899 Other long term (current) drug therapy: Secondary | ICD-10-CM

## 2020-02-05 DIAGNOSIS — Z90710 Acquired absence of both cervix and uterus: Secondary | ICD-10-CM

## 2020-02-05 DIAGNOSIS — Z20822 Contact with and (suspected) exposure to covid-19: Secondary | ICD-10-CM | POA: Diagnosis present

## 2020-02-05 DIAGNOSIS — G629 Polyneuropathy, unspecified: Secondary | ICD-10-CM | POA: Diagnosis present

## 2020-02-05 DIAGNOSIS — R338 Other retention of urine: Secondary | ICD-10-CM | POA: Diagnosis present

## 2020-02-05 DIAGNOSIS — W19XXXA Unspecified fall, initial encounter: Secondary | ICD-10-CM | POA: Diagnosis present

## 2020-02-05 DIAGNOSIS — M545 Low back pain: Secondary | ICD-10-CM | POA: Diagnosis present

## 2020-02-05 DIAGNOSIS — F418 Other specified anxiety disorders: Secondary | ICD-10-CM | POA: Diagnosis present

## 2020-02-05 DIAGNOSIS — Z993 Dependence on wheelchair: Secondary | ICD-10-CM

## 2020-02-05 DIAGNOSIS — Z7982 Long term (current) use of aspirin: Secondary | ICD-10-CM

## 2020-02-05 DIAGNOSIS — Z87891 Personal history of nicotine dependence: Secondary | ICD-10-CM

## 2020-02-05 DIAGNOSIS — M810 Age-related osteoporosis without current pathological fracture: Secondary | ICD-10-CM | POA: Diagnosis present

## 2020-02-05 DIAGNOSIS — F341 Dysthymic disorder: Secondary | ICD-10-CM | POA: Diagnosis present

## 2020-02-05 LAB — COMPREHENSIVE METABOLIC PANEL
ALT: 20 U/L (ref 0–44)
AST: 40 U/L (ref 15–41)
Albumin: 3.8 g/dL (ref 3.5–5.0)
Alkaline Phosphatase: 85 U/L (ref 38–126)
Anion gap: 10 (ref 5–15)
BUN: 28 mg/dL — ABNORMAL HIGH (ref 8–23)
CO2: 30 mmol/L (ref 22–32)
Calcium: 9.2 mg/dL (ref 8.9–10.3)
Chloride: 97 mmol/L — ABNORMAL LOW (ref 98–111)
Creatinine, Ser: 1.12 mg/dL — ABNORMAL HIGH (ref 0.44–1.00)
GFR calc Af Amer: 52 mL/min — ABNORMAL LOW (ref >60)
GFR calc non Af Amer: 45 mL/min — ABNORMAL LOW (ref >60)
Glucose, Bld: 126 mg/dL — ABNORMAL HIGH (ref 70–99)
Potassium: 4.6 mmol/L (ref 3.5–5.1)
Sodium: 137 mmol/L (ref 135–145)
Total Bilirubin: 0.8 mg/dL (ref 0.3–1.2)
Total Protein: 6.5 g/dL (ref 6.5–8.1)

## 2020-02-05 LAB — RAPID URINE DRUG SCREEN, HOSP PERFORMED
Amphetamines: NOT DETECTED
Barbiturates: NOT DETECTED
Benzodiazepines: NOT DETECTED
Cocaine: NOT DETECTED
Opiates: POSITIVE — AB
Tetrahydrocannabinol: NOT DETECTED

## 2020-02-05 LAB — URINALYSIS, ROUTINE W REFLEX MICROSCOPIC
Bilirubin Urine: NEGATIVE
Glucose, UA: NEGATIVE mg/dL
Hgb urine dipstick: NEGATIVE
Ketones, ur: NEGATIVE mg/dL
Leukocytes,Ua: NEGATIVE
Nitrite: NEGATIVE
Protein, ur: NEGATIVE mg/dL
Specific Gravity, Urine: 1.017 (ref 1.005–1.030)
pH: 6 (ref 5.0–8.0)

## 2020-02-05 LAB — CBC WITH DIFFERENTIAL/PLATELET
Abs Immature Granulocytes: 0.04 10*3/uL (ref 0.00–0.07)
Basophils Absolute: 0 10*3/uL (ref 0.0–0.1)
Basophils Relative: 0 %
Eosinophils Absolute: 0 10*3/uL (ref 0.0–0.5)
Eosinophils Relative: 0 %
HCT: 37.9 % (ref 36.0–46.0)
Hemoglobin: 11.4 g/dL — ABNORMAL LOW (ref 12.0–15.0)
Immature Granulocytes: 0 %
Lymphocytes Relative: 8 %
Lymphs Abs: 1.2 10*3/uL (ref 0.7–4.0)
MCH: 27.3 pg (ref 26.0–34.0)
MCHC: 30.1 g/dL (ref 30.0–36.0)
MCV: 90.7 fL (ref 80.0–100.0)
Monocytes Absolute: 0.9 10*3/uL (ref 0.1–1.0)
Monocytes Relative: 6 %
Neutro Abs: 12.2 10*3/uL — ABNORMAL HIGH (ref 1.7–7.7)
Neutrophils Relative %: 86 %
Platelets: 310 10*3/uL (ref 150–400)
RBC: 4.18 MIL/uL (ref 3.87–5.11)
RDW: 14.1 % (ref 11.5–15.5)
WBC: 14.3 10*3/uL — ABNORMAL HIGH (ref 4.0–10.5)
nRBC: 0 % (ref 0.0–0.2)

## 2020-02-05 LAB — GLUCOSE, CAPILLARY: Glucose-Capillary: 128 mg/dL — ABNORMAL HIGH (ref 70–99)

## 2020-02-05 LAB — CK: Total CK: 1723 U/L — ABNORMAL HIGH (ref 38–234)

## 2020-02-05 MED ORDER — ATORVASTATIN CALCIUM 10 MG PO TABS
10.0000 mg | ORAL_TABLET | Freq: Every day | ORAL | Status: DC
  Administered 2020-02-05: 10 mg via ORAL
  Filled 2020-02-05: qty 1

## 2020-02-05 MED ORDER — LACTATED RINGERS IV SOLN: INTRAVENOUS | Status: AC

## 2020-02-05 MED ORDER — PANTOPRAZOLE SODIUM 40 MG PO TBEC
40.0000 mg | DELAYED_RELEASE_TABLET | Freq: Every day | ORAL | Status: DC
  Administered 2020-02-05 – 2020-02-08 (×4): 40 mg via ORAL
  Filled 2020-02-05 (×4): qty 1

## 2020-02-05 MED ORDER — MIRABEGRON ER 25 MG PO TB24
25.0000 mg | ORAL_TABLET | Freq: Every day | ORAL | Status: DC
  Administered 2020-02-06: 25 mg via ORAL
  Filled 2020-02-05: qty 1

## 2020-02-05 MED ORDER — POLYETHYLENE GLYCOL 3350 17 G PO PACK
17.0000 g | PACK | Freq: Every day | ORAL | Status: DC
  Administered 2020-02-08: 10:00:00 17 g via ORAL
  Filled 2020-02-05 (×2): qty 1

## 2020-02-05 MED ORDER — ENOXAPARIN SODIUM 30 MG/0.3ML ~~LOC~~ SOLN
30.0000 mg | SUBCUTANEOUS | Status: DC
  Administered 2020-02-05: 30 mg via SUBCUTANEOUS
  Filled 2020-02-05: qty 0.3

## 2020-02-05 MED ORDER — SODIUM CHLORIDE 0.9 % IV BOLUS
500.0000 mL | Freq: Once | INTRAVENOUS | Status: AC
  Administered 2020-02-05: 12:00:00 500 mL via INTRAVENOUS

## 2020-02-05 MED ORDER — OXYCODONE HCL 5 MG PO TABS
10.0000 mg | ORAL_TABLET | Freq: Three times a day (TID) | ORAL | Status: DC | PRN
  Administered 2020-02-05 – 2020-02-06 (×3): 10 mg via ORAL
  Filled 2020-02-05 (×3): qty 2

## 2020-02-05 NOTE — H&P (Signed)
[facility] History and Physical  KISHAWNA RAPA [id number] DOB: 11-04-1932 DOA: 02/05/2020  Referring provider: Shelby Dubin, PA - ED PCP: Heywood Bene, PA-C   Chief Complaint: Fall  HPI: Angelica Bell is a 86 y.o. female with PMH HTN and chronic pain presents after a fall and found to have an AKI, elevated CK. Admitted to observation for resolution and to ensure safe discharge. Patient unable to provide much hx. She was brought in by EMS after a fall. Patient lives alone. She reports she was twisted in the sheets this morning and fell out of bed and was unable to get up and called EMS. Patient denies LOC. She does report hitting her head. Reports pain in right shoulder and down left side of body into left leg. Denies any other complaints.   In the ED: Vitals stable. WBC 14.3, Cr 1.12, UA without evidence of infection, CK 1723, UDS with opiates only. CXR, XR Hips, CT Head/C-Spine, Right Hip without acute findings. Patient given 500 mL IVF. Admission requested for AKI and hx of falls; possibly unsafe discharge for home alone.   Review of Systems:  Constitutional:  No weight loss, night sweats, Fevers, chills, fatigue.  HEENT:  No headaches, Difficulty swallowing,Tooth/dental problems,Sore throat,  No sneezing, itching, ear ache, nasal congestion, post nasal drip,  Cardio-vascular:  No chest pain, Orthopnea, PND, swelling in lower extremities, anasarca, dizziness, palpitations  GI:  No heartburn, indigestion, abdominal pain, nausea, vomiting, diarrhea, change in bowel habits, loss of appetite  Resp:  No shortness of breath with exertion or at rest. No excess mucus, no productive cough, No non-productive cough, No coughing up of blood.No change in color of mucus.No wheezing.No chest wall deformity  Skin:  no rash or lesions.  GU:  no dysuria, change in color of urine, no urgency or frequency. No flank pain.  Musculoskeletal:  Pain as described above. Psych:  No  change in mood or affect. No depression or anxiety. No memory loss.   Past Medical History:  Diagnosis Date  . Chronic back pain    with degenerative joint disease  . Chronic pain disorder    with narcotic management  . Dry eyes   . Hypertension   . Osteoporosis   . PE (pulmonary embolism)    After hysterectomy in 1967  . Pneumonia    Past Surgical History:  Procedure Laterality Date  . ABDOMINAL HYSTERECTOMY    . APPENDECTOMY    . BREAST ENHANCEMENT SURGERY    . CATARACT EXTRACTION    . FEMUR IM NAIL Left 02/17/2017   Procedure: INTRAMEDULLARY (IM) NAIL FEMORAL;  Surgeon: Nicholes Stairs, MD;  Location: [facility];  Service: Orthopedics;  Laterality: Left;  . HIP ARTHROPLASTY Right 12/24/2015   Procedure: ARTHROPLASTY BIPOLAR HIP (HEMIARTHROPLASTY);  Surgeon: Netta Cedars, MD;  Location: [facility];  Service: Orthopedics;  Laterality: Right;  . TONSILLECTOMY     Social History:  reports that she quit smoking about 43 years ago. She quit after 13.00 years of use. She has never used smokeless tobacco. She reports that she does not drink alcohol or use drugs.  Allergies  Allergen Reactions  . Hctz [Hydrochlorothiazide]     Significant and rapid hyponatremia (TIH)  . Amoxicillin-Pot Clavulanate Hives and Diarrhea    Has patient had a PCN reaction causing immediate rash, facial/tongue/throat swelling, SOB or lightheadedness with hypotension: yes Has patient had a PCN reaction causing severe rash involving mucus membranes or skin necrosis: yes Has patient had a  PCN reaction that required hospitalization : unknown Has patient had a PCN reaction occurring within the last 10 years: unknown If all of the above answers are "NO", then may proceed with Cephalosporin use.   Marland Kitchen Lisinopril-Hydrochlorothiazide Other (See Comments)    Tired- no energy to do anything..  . Sulfa Antibiotics Hives and Nausea And Vomiting  . Sulfonamide Derivatives Hives    Family History  Problem Relation Age of  Onset  . Heart Problems Mother     Prior to Admission medications   Medication Sig Start Date End Date Taking? Authorizing Provider  amLODipine (NORVASC) 10 MG tablet Take 1 tablet (10 mg total) by mouth daily. 09/08/19 10/08/19  Kayleen Memos, DO  aspirin EC 81 MG EC tablet Take 1 tablet (81 mg total) by mouth daily. 09/15/19   Matcha, Beverely Pace, MD  atorvastatin (LIPITOR) 10 MG tablet Take 1 tablet (10 mg total) by mouth daily at 6 PM. 08/07/19   Mariel Aloe, MD  cephALEXin (KEFLEX) 500 MG capsule Take 500 mg by mouth 3 (three) times daily. 11/24/19   [provider]  escitalopram (LEXAPRO) 10 MG tablet Take 10 mg by mouth daily. 01/10/20   [provider]  escitalopram (LEXAPRO) 20 MG tablet Take 1 tablet (20 mg total) by mouth daily. Patient not taking: Reported on 02/05/2020 09/08/19   Kayleen Memos, DO  furosemide (LASIX) 20 MG tablet Take 20 mg by mouth daily. 01/10/20   [provider]  gabapentin (NEURONTIN) 100 MG capsule Take 200 mg by mouth 3 (three) times daily. 01/20/20   [provider]  Menthol-Methyl Salicylate (MUSCLE RUB) 10-15 % CREA Apply 1 application topically as needed for muscle pain. 08/07/19   Mariel Aloe, MD  mirabegron ER (MYRBETRIQ) 25 MG TB24 tablet Take 1 tablet (25 mg total) by mouth daily. 08/08/19   Mariel Aloe, MD  oxyCODONE (ROXICODONE) 15 MG immediate release tablet Take 1 tablet (15 mg total) by mouth every 4 (four) hours as needed for severe pain or breakthrough pain. Patient not taking: Reported on 02/05/2020 09/15/19   Yaakov Guthrie, MD  oxycodone (ROXICODONE) 30 MG immediate release tablet Take 30 mg by mouth 3 (three) times daily as needed for pain. 01/25/20   [provider]  pantoprazole (PROTONIX) 40 MG tablet Take 1 tablet (40 mg total) by mouth daily. 09/08/19   Kayleen Memos, DO  polyethylene glycol (MIRALAX / GLYCOLAX) 17 g packet Take 17 g by mouth daily. 09/08/19   Kayleen Memos, DO   Physical  Exam: Vitals:   02/05/20 1122 02/05/20 1400 02/05/20 1430  BP: 120/62 (!) 121/55 (!) 113/52  Pulse: 71 (!) 58 61  Resp: 18 15 10   Temp: (!) 97.4 F (36.3 C)    TempSrc: Oral    SpO2: 96% 97% 95%    Wt Readings from Last 3 Encounters:  09/13/19 41.7 kg  08/05/19 55 kg  01/23/19 55.8 kg    General:  Appears calm and comfortable. AAO x 4 although intermittently confused.  Eyes: EOMI, normal lids, irises & conjunctiva ENT: difficulty hearing  Neck: no LAD, masses or thyromegaly Cardiovascular: RRR, no m/r/g. No LE edema. Telemetry: SR, no arrhythmias  Respiratory: CTA bilaterally, no w/r/r. Normal respiratory effort. Abdomen: soft, ntnd Skin: no rash or induration seen on limited exam Musculoskeletal: grossly normal tone BUE/BLE; tenderness along RLE and right shoulder without bruising or swelling  Psychiatric: grossly normal mood and affect, speech fluent and appropriate Neurologic: grossly non-focal.  Labs on Admission:  Basic Metabolic Panel: Recent Labs  Lab 02/05/20 1142  NA 137  K 4.6  CL 97*  CO2 30  GLUCOSE 126*  BUN 28*  CREATININE 1.12*  CALCIUM 9.2   Liver Function Tests: Recent Labs  Lab 02/05/20 1142  AST 40  ALT 20  ALKPHOS 85  BILITOT 0.8  PROT 6.5  ALBUMIN 3.8   No results for input(s): LIPASE, AMYLASE in the last 168 hours. No results for input(s): AMMONIA in the last 168 hours. CBC: Recent Labs  Lab 02/05/20 1142  WBC 14.3*  NEUTROABS 12.2*  HGB 11.4*  HCT 37.9  MCV 90.7  PLT 310   Cardiac Enzymes: Recent Labs  Lab 02/05/20 1142  CKTOTAL 1,723*    BNP (last 3 results) No results for input(s): BNP in the last 8760 hours.  ProBNP (last 3 results) No results for input(s): PROBNP in the last 8760 hours.  CBG: No results for input(s): GLUCAP in the last 168 hours.  Radiological Exams on Admission: CT Head Wo Contrast  Result Date: 02/05/2020 CLINICAL DATA:  The patient fell out of bed this morning at 8:30 a.m.  Initial encounter. EXAM: CT HEAD WITHOUT CONTRAST CT CERVICAL SPINE WITHOUT CONTRAST TECHNIQUE: Multidetector CT imaging of the head and cervical spine was performed following the standard protocol without intravenous contrast. Multiplanar CT image reconstructions of the cervical spine were also generated. COMPARISON:  Head and cervical spine CT scan 08/31/2019. FINDINGS: CT HEAD FINDINGS Brain: No evidence of acute infarction, hemorrhage, hydrocephalus, extra-axial collection or mass lesion/mass effect. Atrophy, chronic microvascular ischemic change and remote lacunar infarctions in the right cerebellum and right corona radiata noted. Vascular: No hyperdense vessel or unexpected calcification. Skull: Intact.  No focal lesion. Sinuses/Orbits: Status post cataract surgery.  Otherwise negative. Other: None. CT CERVICAL SPINE FINDINGS Alignment: Maintained with straightening of lordosis noted. Skull base and vertebrae: No acute fracture. No primary bone lesion or focal pathologic process. Soft tissues and spinal canal: No prevertebral fluid or swelling. No visible canal hematoma. Disc levels:  Multilevel loss of disc space height noted. Upper chest: Lung apices clear. Other: None. IMPRESSION: No acute abnormality head or cervical spine. Atrophy, chronic microvascular ischemic change and remote lacunar infarctions as described above. Cervical spondylosis. Electronically Signed   By: Inge Rise M.D.   On: 02/05/2020 14:24   CT Cervical Spine Wo Contrast  Result Date: 02/05/2020 CLINICAL DATA:  The patient fell out of bed this morning at 8:30 a.m. Initial encounter. EXAM: CT HEAD WITHOUT CONTRAST CT CERVICAL SPINE WITHOUT CONTRAST TECHNIQUE: Multidetector CT imaging of the head and cervical spine was performed following the standard protocol without intravenous contrast. Multiplanar CT image reconstructions of the cervical spine were also generated. COMPARISON:  Head and cervical spine CT scan 08/31/2019.  FINDINGS: CT HEAD FINDINGS Brain: No evidence of acute infarction, hemorrhage, hydrocephalus, extra-axial collection or mass lesion/mass effect. Atrophy, chronic microvascular ischemic change and remote lacunar infarctions in the right cerebellum and right corona radiata noted. Vascular: No hyperdense vessel or unexpected calcification. Skull: Intact.  No focal lesion. Sinuses/Orbits: Status post cataract surgery.  Otherwise negative. Other: None. CT CERVICAL SPINE FINDINGS Alignment: Maintained with straightening of lordosis noted. Skull base and vertebrae: No acute fracture. No primary bone lesion or focal pathologic process. Soft tissues and spinal canal: No prevertebral fluid or swelling. No visible canal hematoma. Disc levels:  Multilevel loss of disc space height noted. Upper chest: Lung apices clear. Other: None. IMPRESSION: No acute abnormality head  or cervical spine. Atrophy, chronic microvascular ischemic change and remote lacunar infarctions as described above. Cervical spondylosis. Electronically Signed   By: Inge Rise M.D.   On: 02/05/2020 14:24   CT Hip Right Wo Contrast  Result Date: 02/05/2020 CLINICAL DATA:  Right hip pain due to a fall out of bed at 8:30 a.m. this morning. Initial encounter. EXAM: CT OF THE RIGHT HIP WITHOUT CONTRAST TECHNIQUE: Multidetector CT imaging of the right hip was performed according to the standard protocol. Multiplanar CT image reconstructions were also generated. COMPARISON:  Plain films right hip earlier today and 07/26/2019. FINDINGS: Bones/Joint/Cartilage Bipolar right hip hemiarthroplasty is again seen. The device is located. There is no fracture. Bones appear osteopenic. There is some degenerative disease about the right SI joint and symphysis pubis. No lytic or sclerotic lesion. Ligaments Suboptimally assessed by CT. Muscles and Tendons Appear normal. Soft tissues Sigmoid diverticulosis noted. IMPRESSION: No acute abnormality. Status post right hip  replacement.  No hardware complication. Degenerative disease at the symphysis pubis and right SI joint. Sigmoid diverticulosis. Electronically Signed   By: Inge Rise M.D.   On: 02/05/2020 14:08   DG Chest Portable 1 View  Result Date: 02/05/2020 CLINICAL DATA:  86 year old female status post fall with suspected right hip fracture EXAM: PORTABLE CHEST 1 VIEW COMPARISON:  Prior chest x-ray 01/31/2019 FINDINGS: Low inspiratory volumes with streaky bibasilar airspace opacities likely representing atelectasis. Stable bronchitic changes. Cardiac and mediastinal contours are unchanged. Probable large hiatal hernia. Atherosclerotic calcifications visible in the transverse aorta. Remote healed left proximal humerus fracture. No acute fracture identified. Advanced levoconvex thoracolumbar scoliosis. IMPRESSION: 1. No active cardiopulmonary disease. 2. Low inspiratory volumes with bibasilar atelectasis. 3. Stable chronic bronchitic changes. 4. Large hiatal hernia. Electronically Signed   By: Jacqulynn Cadet M.D.   On: 02/05/2020 12:40   DG HIPS BILAT WITH PELVIS 3-4 VIEWS  Result Date: 02/05/2020 CLINICAL DATA:  Left hip pain after a fall this morning. Bilateral hip pain after a fall this morning. Initial encounter. EXAM: DG HIP (WITH OR WITHOUT PELVIS) 3-4V BILAT COMPARISON:  Plain film of the pelvis 09/01/2019. FINDINGS: There is no acute bony or joint abnormality. The patient is status post right hip replacement and fixation of a healed left intertrochanteric fracture. IMPRESSION: No acute abnormality. Electronically Signed   By: Inge Rise M.D.   On: 02/05/2020 12:43    EKG: Ordered and not yet done.   Assessment/Plan Principal Problem:   AKI (acute kidney injury) ([place]) Active Problems:   ANXIETY DEPRESSION   Chronic pain   Essential hypertension  86 y.o. female with PMH HTN and chronic pain presents after a fall and found to have an AKI, elevated CK. Admitted to observation for resolution  and to ensure safe discharge.  1. AKI - Baseline Cr 0.8; Cr 1.12 on admission - CK elevated although not to rhabdo level - gentle fluid rehydration and repeat BMP AM; Echo with normal EF July 2020 - PT consulted   2. HTN - BP's normotensive; BP goals higher due to age - PTA Lasix and Norvasc held  3. Depression and Anxiety - Lexapro held as could be causing confusion or increase fall risk  4. Neuropathy - Gabapentin ordered at lower than PTA dose considering AKI and age  101. HLD - cont PTA Lipitor  6. Chronic Pain - On Oxycodone 30 mg TID; likely too much considering age and in setting of falls - Lowered to 10 mg TID on admission   Code Status:  DNR/DNI; discussed with patient. She is okay with ICU-status, BIPAP, IV abx. Does not desire CPR or intubation.  DVT Prophylaxis: Lovenox Family Communication: Attempted to call grandson; no answer Disposition Plan: Admit to Obs. Patient requiring IV fluid rehydration and PT consult as unsure if safe discharge home. High risk for complications due to age and frail status.   Time spent: 45 min  Chauncey Mann, MD [facility] Pager [phone]
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
## 2020-02-05 NOTE — Progress Notes (Signed)
Called to get report on the patient coming to 1612. No answer. Will wait for ED RN to call back.

## 2020-02-05 NOTE — ED Notes (Signed)
New Nurse unable to take report right now, will call again in 10 mins.

## 2020-02-05 NOTE — ED Triage Notes (Signed)
Per EMS, patient from home, reports fall from bed at 0830 this morning while trying to get out of bed. Rotation to right leg. Denies pain. A&Ox4. Denies head injury and LOC.  20g R AC  BP 108/74 HR 80 RR 22

## 2020-02-05 NOTE — Plan of Care (Signed)
Pt arrived to floor in stable condition. No need at time of arrival. Pt did report severe neck and back pain to RN. Rn will medicate for this need. Assessment performed.

## 2020-02-06 ENCOUNTER — Observation Stay (HOSPITAL_COMMUNITY): Payer: Medicare Other

## 2020-02-06 DIAGNOSIS — F418 Other specified anxiety disorders: Secondary | ICD-10-CM | POA: Diagnosis present

## 2020-02-06 DIAGNOSIS — E785 Hyperlipidemia, unspecified: Secondary | ICD-10-CM | POA: Diagnosis present

## 2020-02-06 DIAGNOSIS — Z7982 Long term (current) use of aspirin: Secondary | ICD-10-CM | POA: Diagnosis not present

## 2020-02-06 DIAGNOSIS — Z90710 Acquired absence of both cervix and uterus: Secondary | ICD-10-CM | POA: Diagnosis not present

## 2020-02-06 DIAGNOSIS — M545 Low back pain: Secondary | ICD-10-CM | POA: Diagnosis present

## 2020-02-06 DIAGNOSIS — G629 Polyneuropathy, unspecified: Secondary | ICD-10-CM | POA: Diagnosis present

## 2020-02-06 DIAGNOSIS — Z20822 Contact with and (suspected) exposure to covid-19: Secondary | ICD-10-CM | POA: Diagnosis present

## 2020-02-06 DIAGNOSIS — G8929 Other chronic pain: Secondary | ICD-10-CM | POA: Diagnosis present

## 2020-02-06 DIAGNOSIS — R338 Other retention of urine: Secondary | ICD-10-CM | POA: Diagnosis present

## 2020-02-06 DIAGNOSIS — N179 Acute kidney failure, unspecified: Secondary | ICD-10-CM | POA: Diagnosis present

## 2020-02-06 DIAGNOSIS — Z79899 Other long term (current) drug therapy: Secondary | ICD-10-CM | POA: Diagnosis not present

## 2020-02-06 DIAGNOSIS — W19XXXA Unspecified fall, initial encounter: Secondary | ICD-10-CM | POA: Diagnosis present

## 2020-02-06 DIAGNOSIS — Z86711 Personal history of pulmonary embolism: Secondary | ICD-10-CM | POA: Diagnosis not present

## 2020-02-06 DIAGNOSIS — Z993 Dependence on wheelchair: Secondary | ICD-10-CM | POA: Diagnosis not present

## 2020-02-06 DIAGNOSIS — I129 Hypertensive chronic kidney disease with stage 1 through stage 4 chronic kidney disease, or unspecified chronic kidney disease: Secondary | ICD-10-CM | POA: Diagnosis present

## 2020-02-06 DIAGNOSIS — Z66 Do not resuscitate: Secondary | ICD-10-CM | POA: Diagnosis present

## 2020-02-06 DIAGNOSIS — W19XXXD Unspecified fall, subsequent encounter: Secondary | ICD-10-CM

## 2020-02-06 DIAGNOSIS — M6282 Rhabdomyolysis: Secondary | ICD-10-CM | POA: Diagnosis present

## 2020-02-06 DIAGNOSIS — M810 Age-related osteoporosis without current pathological fracture: Secondary | ICD-10-CM | POA: Diagnosis present

## 2020-02-06 DIAGNOSIS — Z9181 History of falling: Secondary | ICD-10-CM | POA: Diagnosis not present

## 2020-02-06 DIAGNOSIS — R296 Repeated falls: Secondary | ICD-10-CM | POA: Diagnosis present

## 2020-02-06 DIAGNOSIS — Z87891 Personal history of nicotine dependence: Secondary | ICD-10-CM | POA: Diagnosis not present

## 2020-02-06 DIAGNOSIS — N183 Chronic kidney disease, stage 3 unspecified: Secondary | ICD-10-CM | POA: Diagnosis present

## 2020-02-06 LAB — BASIC METABOLIC PANEL
Anion gap: 5 (ref 5–15)
BUN: 24 mg/dL — ABNORMAL HIGH (ref 8–23)
CO2: 31 mmol/L (ref 22–32)
Calcium: 8.6 mg/dL — ABNORMAL LOW (ref 8.9–10.3)
Chloride: 102 mmol/L (ref 98–111)
Creatinine, Ser: 0.89 mg/dL (ref 0.44–1.00)
GFR calc Af Amer: >60 mL/min (ref >60)
GFR calc non Af Amer: 59 mL/min — ABNORMAL LOW (ref >60)
Glucose, Bld: 111 mg/dL — ABNORMAL HIGH (ref 70–99)
Potassium: 3.7 mmol/L (ref 3.5–5.1)
Sodium: 138 mmol/L (ref 135–145)

## 2020-02-06 LAB — CBC
HCT: 30.1 % — ABNORMAL LOW (ref 36.0–46.0)
Hemoglobin: 9.2 g/dL — ABNORMAL LOW (ref 12.0–15.0)
MCH: 27.4 pg (ref 26.0–34.0)
MCHC: 30.6 g/dL (ref 30.0–36.0)
MCV: 89.6 fL (ref 80.0–100.0)
Platelets: 209 10*3/uL (ref 150–400)
RBC: 3.36 MIL/uL — ABNORMAL LOW (ref 3.87–5.11)
RDW: 14.2 % (ref 11.5–15.5)
WBC: 7.3 10*3/uL (ref 4.0–10.5)
nRBC: 0 % (ref 0.0–0.2)

## 2020-02-06 LAB — MAGNESIUM: Magnesium: 1.7 mg/dL (ref 1.7–2.4)

## 2020-02-06 LAB — CK: Total CK: 1152 U/L — ABNORMAL HIGH (ref 38–234)

## 2020-02-06 LAB — SARS CORONAVIRUS 2 (TAT 6-24 HRS): SARS Coronavirus 2: NEGATIVE

## 2020-02-06 LAB — PHOSPHORUS: Phosphorus: 3.1 mg/dL (ref 2.5–4.6)

## 2020-02-06 MED ORDER — OXYCODONE HCL 5 MG PO TABS
10.0000 mg | ORAL_TABLET | Freq: Three times a day (TID) | ORAL | Status: DC | PRN
  Administered 2020-02-06 – 2020-02-08 (×5): 10 mg via ORAL
  Filled 2020-02-06 (×5): qty 2

## 2020-02-06 MED ORDER — ENOXAPARIN SODIUM 40 MG/0.4ML ~~LOC~~ SOLN: 40.0000 mg | SUBCUTANEOUS | Status: DC

## 2020-02-06 MED ORDER — CHLORHEXIDINE GLUCONATE CLOTH 2 % EX PADS
6.0000 | MEDICATED_PAD | Freq: Every day | CUTANEOUS | Status: DC
  Administered 2020-02-06 – 2020-02-08 (×2): 6 via TOPICAL

## 2020-02-06 MED ORDER — GABAPENTIN 100 MG PO CAPS
100.0000 mg | ORAL_CAPSULE | Freq: Three times a day (TID) | ORAL | Status: DC
  Administered 2020-02-06 – 2020-02-08 (×6): 100 mg via ORAL
  Filled 2020-02-06 (×6): qty 1

## 2020-02-06 MED ORDER — HEPARIN SODIUM (PORCINE) 5000 UNIT/ML IJ SOLN
5000.0000 [IU] | Freq: Three times a day (TID) | INTRAMUSCULAR | Status: DC
  Administered 2020-02-06 – 2020-02-08 (×5): 5000 [IU] via SUBCUTANEOUS
  Filled 2020-02-06 (×5): qty 1

## 2020-02-06 MED ORDER — TRAMADOL HCL 50 MG PO TABS
50.0000 mg | ORAL_TABLET | Freq: Four times a day (QID) | ORAL | Status: DC | PRN
  Administered 2020-02-06 – 2020-02-08 (×4): 50 mg via ORAL
  Filled 2020-02-06 (×4): qty 1

## 2020-02-06 NOTE — Plan of Care (Signed)
  Problem: Clinical Measurements: Goal: Respiratory complications will improve Outcome: Progressing   Problem: Clinical Measurements: Goal: Cardiovascular complication will be avoided Outcome: Progressing   Problem: Nutrition: Goal: Adequate nutrition will be maintained Outcome: Progressing   Problem: Coping: Goal: Level of anxiety will decrease Outcome: Progressing   Problem: Elimination: Goal: Will not experience complications related to bowel motility Outcome: Progressing   

## 2020-02-06 NOTE — Evaluation (Signed)
Physical Therapy Evaluation Patient Details Name: Angelica Bell MRN: [id number] DOB: Jul 11, 1932 Today's Date: 02/06/2020   History of Present Illness  86 y.o. female with PMH HTN and chronic pain presents after a fall and found to have an AKI, elevated CK. Admitted to observation for resolution and to ensure safe discharge. Patient unable to provide much hx. She was brought in by EMS after a fall.PMHx: pelvic fx, hx of falls, R THA, back pain, scoliosis, HTN, CVA. CT and xrays negative for acute abnormalities  Clinical Impression  Pt admitted with above diagnosis.  Pt initially declined  EOB/OOB, then agreeable to transfer to [facility].  Pt transfers (squat pivot) and functions from w/c level at home/baseline. Will continue to follow in acute setting.  Recommend SNF however pt needs ultimate ALF.  she is currently refusing SNF. If home recommend HHPT although pt is quite likely near her baseline. She is not amenable to PT input this date and has her own way of doing things. Will follow in acute setting as able   Pt currently with functional limitations due to the deficits listed below (see PT Problem List). Pt will benefit from skilled PT to increase their independence and safety with mobility to allow discharge to the venue listed below.       Follow Up Recommendations SNF(vs HHPT, pt refusing SNF)    Equipment Recommendations  None recommended by PT    Recommendations for Other Services       Precautions / Restrictions Precautions Precautions: Fall Restrictions Weight Bearing Restrictions: No      Mobility  Bed Mobility Overal bed mobility: Needs Assistance Bed Mobility: Supine to Sit     Supine to sit: Min guard     General bed mobility comments: incr time, min/guard for safety  Transfers Overall transfer level: Needs assistance   Transfers: Squat Pivot Transfers     Squat pivot transfers: Min guard     General transfer comment: for safety, no physical  assist  Ambulation/Gait             General Gait Details: non-ambulatory at baseline  Stairs            Wheelchair Mobility    Modified Rankin (Stroke Patients Only)       Balance Overall balance assessment: Needs assistance;History of Falls Sitting-balance support: Feet supported;Single extremity supported Sitting balance-Leahy Scale: Fair       Standing balance-Leahy Scale: Zero Standing balance comment: unable to come to full standing                             Pertinent Vitals/Pain Pain Assessment: Faces Faces Pain Scale: Hurts whole lot Pain Location: "every where" Pain Descriptors / Indicators: Grimacing;Sore Pain Intervention(s): Monitored during session;Limited activity within patient's tolerance;Repositioned    Home Living Family/patient expects to be discharged to:: Unsure Living Arrangements: Alone   Type of Home: House Home Access: Level entry     Home Layout: One level Home Equipment: Walker - 2 wheels;Wheelchair - manual;Grab bars - toilet Additional Comments: some info from previous admission, some from pt, unsure of reliability of pt    Prior Function Level of Independence: Independent with assistive device(s)   Gait / Transfers Assistance Needed: non ambulatory per pt, uses w/c in the house, transfers (?squat pivot) independently           Hand Dominance   Dominant Hand: Right    Extremity/Trunk Assessment   Upper  Extremity Assessment Upper Extremity Assessment: Generalized weakness;Defer to OT evaluation    Lower Extremity Assessment Lower Extremity Assessment: Generalized weakness;RLE deficits/detail;LLE deficits/detail RLE Deficits / Details: pt moves against gravity per obs, unable to test d/t pain RLE: Unable to fully assess due to pain RLE Coordination: decreased gross motor LLE Deficits / Details: at least 3/5       Communication   Communication: HOH  Cognition Arousal/Alertness:  Awake/alert Behavior During Therapy: Flat affect(easily agitated) Overall Cognitive Status: No family/caregiver present to determine baseline cognitive functioning                                 General Comments: alert and oriented, tangential at times      General Comments      Exercises     Assessment/Plan    PT Assessment Patient needs continued PT services  PT Problem List Decreased strength;Decreased activity tolerance;Decreased balance;Pain;Decreased range of motion;Decreased mobility;Decreased safety awareness       PT Treatment Interventions Therapeutic exercise;Functional mobility training;Therapeutic activities;Patient/family education;Balance training    PT Goals (Current goals can be found in the Care Plan section)  Acute Rehab PT Goals Patient Stated Goal: none stated PT Goal Formulation: With patient Time For Goal Achievement: 02/20/20 Potential to Achieve Goals: Fair    Frequency Min 2X/week   Barriers to discharge Decreased caregiver support      Co-evaluation               AM-PAC PT "6 Clicks" Mobility  Outcome Measure Help needed turning from your back to your side while in a flat bed without using bedrails?: A Little Help needed moving from lying on your back to sitting on the side of a flat bed without using bedrails?: A Little Help needed moving to and from a bed to a chair (including a wheelchair)?: A Little Help needed standing up from a chair using your arms (e.g., wheelchair or bedside chair)?: A Lot Help needed to walk in hospital room?: Total Help needed climbing 3-5 steps with a railing? : Total 6 Click Score: 13    End of Session   Activity Tolerance: Patient tolerated treatment well Patient left: with call bell/phone within reach;Other (comment)(BSC, RN and NT aware) Nurse Communication: Mobility status PT Visit Diagnosis: Muscle weakness (generalized) (M62.81);History of falling (Z91.81)    Time: [id number] PT  Time Calculation (min) (ACUTE ONLY): 25 min   Charges:   PT Evaluation $PT Eval Low Complexity: 1 Low PT Treatments $Therapeutic Activity: 8-22 mins        Baxter Flattery, PT   Acute Rehab Dept [facility]): [id number]   02/06/2020   [facility] 02/06/2020, 2:52 PM

## 2020-02-06 NOTE — Progress Notes (Signed)
Patient was bladder scanned, 465 ml of urine scanned. In and out cath done with 450 ml clear urine drained.  Procedure well tolerated by patient. Peri care done.

## 2020-02-06 NOTE — Progress Notes (Signed)
PROGRESS NOTE    Angelica Bell    Code Status: DNR  [id number] DOB: 02/14/1932 DOA: 02/05/2020  PCP: [facility]wood Bene, [facility] Summary  This is an 86 year old female with history of hypertension, chronic low back pain on opiates, recurrent falls, lives at home alone who presented after a mechanical fall at home after tripping on the sheets found to have an AKI, elevated CK and placed in observation.  Imaging negative for fractures.  Notable ED labs: WBC 14, creatinine 1.12, CK 1723.  Treated with IV fluids.  Medications reconciled as patient is on high-dose opiates, gabapentin, Lasix.  Complaining of worsened lumbar pain and found to have urinary retention on 2/7, Foley placed.  Unable to go for MRI lumbar spine due to prior hip fracture/repair.  Had CT lumbar spine with chronic degenerative changes and vacuum disc phenomenon which was discussed with IR and recommended conservative management.  PT recommending SNF but patient refused.  Admitted to inpatient pending further pain management and safe discharge planning.  A & P   Principal Problem:   AKI (acute kidney injury) ([place]) Active Problems:   ANXIETY DEPRESSION   Chronic pain   Essential hypertension   1. AKI on CKD 3 secondary to rhabdomyolysis a. Resolved with IV fluids 2. Acute on chronic low back pain, on narcotics a. Takes oxycodone 30 mg 3 times daily at home b. Currently on 10 mg 3 times daily as needed for severe pain and tramadol as needed for moderate pain.  c. PT recommending SNF, patient refusing d. Needs safe pain regimen prior to discharge 3. Neuropathy a. On gabapentin 200 mg 3 times daily b. Will renally dose gabapentin to 100 mg 3 times daily 4. Urinary retention of unknown etiology-possibly medication induced a. will stop Myrbetriq b. Cannot get MRI lumbar spine due to prior hip surgery hardware c. Foley inserted 2/7 5. Mild rhabdomyolysis secondary to fall a. Improved, can discontinue  fluids 6. Status post mechanical fall a. Reportedly after going from laying down to standing up, on admission stated tripped in the bed sheets. b. Attempt orthostatics if patient will tolerate c. Likely due to polypharmacy d. History of multiple falls and lives alone-needs safe dispo prior to discharge  DVT prophylaxis: Subcu heparin Family Communication: Called patient's daughter, did not answer Disposition Plan: Admit patient for further pain management, urinary retention and needs safe dispo.  PT recommending SNF but patient refusing.  Will consult TOC team.  Likely stable for discharge in next 24 to 48 hours  Consultants  None  Procedures  None  Antibiotics   Anti-infectives (From admission, onward)   None           Subjective   Notified by nursing that patient required multiple straight catheterizations due to urinary retention and difficulty getting out of bed due to low back pain to use the commode.  Foley catheter inserted.  Patient complaining of significant lumbar pain now that she has been off of her home oxycodone and other pain medications.  States that she lives alone and ambulates with a wheelchair.  Does not wish to come off of her opiates as this is the only thing that helps her.  Denies weakness in her legs, numbness or paresthesias.  No other complaints at this time.  Objective   Vitals:   02/05/20 1703 02/05/20 2114 02/06/20 0514 02/06/20 1037  BP: 115/65 (!) 98/47 (!) 105/57 (!) 109/52  Pulse: 75 75 66 73  Resp: 17 16  16 16  Temp: 98 F (36.7 C) 97.9 F (36.6 C) 97.8 F (36.6 C) 98.1 F (36.7 C)  TempSrc: Oral Oral Oral Oral  SpO2: 94% 94% 93% 95%  Weight:      Height:        Intake/Output Summary (Last 24 hours) at 02/06/2020 1552 Last data filed at 02/06/2020 1512 Gross per 24 hour  Intake 2655.28 ml  Output 2050 ml  Net 605.28 ml   Filed Weights   02/05/20 1642  Weight: 51.6 kg    Examination:  Physical Exam Vitals and nursing  note reviewed.  Constitutional:      Comments: Frail elderly female  Eyes:     Conjunctiva/sclera: Conjunctivae normal.  Cardiovascular:     Rate and Rhythm: Normal rate and regular rhythm.  Pulmonary:     Effort: Pulmonary effort is normal.     Breath sounds: Normal breath sounds.  Abdominal:     General: Abdomen is flat. There is no distension.  Musculoskeletal:        General: No swelling or tenderness.     Comments: No leg weakness  Neurological:     Mental Status: She is alert. Mental status is at baseline.  Psychiatric:        Mood and Affect: Mood normal.        Behavior: Behavior normal.     Data Reviewed: I have personally reviewed following labs and imaging studies  CBC: Recent Labs  Lab 02/05/20 1142 02/06/20 0431  WBC 14.3* 7.3  NEUTROABS 12.2*  --   HGB 11.4* 9.2*  HCT 37.9 30.1*  MCV 90.7 89.6  PLT 310 [id number]   Basic Metabolic Panel: Recent Labs  Lab 02/05/20 1142 02/06/20 0431 02/06/20 0800  NA 137 138  --   K 4.6 3.7  --   CL 97* 102  --   CO2 30 31  --   GLUCOSE 126* 111*  --   BUN 28* 24*  --   CREATININE 1.12* 0.89  --   CALCIUM 9.2 8.6*  --   MG  --   --  1.7  PHOS  --   --  3.1   GFR: Estimated Creatinine Clearance: 33.2 mL/min (by C-G formula based on SCr of 0.89 mg/dL). Liver Function Tests: Recent Labs  Lab 02/05/20 1142  AST 40  ALT 20  ALKPHOS 85  BILITOT 0.8  PROT 6.5  ALBUMIN 3.8   No results for input(s): LIPASE, AMYLASE in the last 168 hours. No results for input(s): AMMONIA in the last 168 hours. Coagulation Profile: No results for input(s): INR, PROTIME in the last 168 hours. Cardiac Enzymes: Recent Labs  Lab 02/05/20 1142 02/06/20 0431  CKTOTAL 1,723* 1,152*   BNP (last 3 results) No results for input(s): PROBNP in the last 8760 hours. HbA1C: No results for input(s): HGBA1C in the last 72 hours. CBG: Recent Labs  Lab 02/05/20 2222  GLUCAP 128*   Lipid Profile: No results for input(s): CHOL, HDL,  LDLCALC, TRIG, CHOLHDL, LDLDIRECT in the last 72 hours. Thyroid Function Tests: No results for input(s): TSH, T4TOTAL, FREET4, T3FREE, THYROIDAB in the last 72 hours. Anemia Panel: No results for input(s): VITAMINB12, FOLATE, FERRITIN, TIBC, IRON, RETICCTPCT in the last 72 hours. Sepsis Labs: No results for input(s): PROCALCITON, LATICACIDVEN in the last 168 hours.  Recent Results (from the past 240 hour(s))  Blood culture (routine x 2)     Status: None (Preliminary result)   Collection Time: 02/05/20 11:53 AM  Specimen: BLOOD  Result Value Ref Range Status   Specimen Description BLOOD LEFT ANTECUBITAL  Final   Special Requests   Final    BAA Blood Culture adequate volume Performed at [facility], [place] [address] [place], [place] [address]    Culture NO GROWTH < 24 HOURS  Final   Report Status PENDING  Incomplete  Blood culture (routine x 2)     Status: None (Preliminary result)   Collection Time: 02/05/20 11:54 AM   Specimen: BLOOD  Result Value Ref Range Status   Specimen Description BLOOD RIGHT ANTECUBITAL  Final   Special Requests   Final    AEB Blood Culture results may not be optimal due to an inadequate volume of blood received in culture bottles Performed at [facility], [place] [address], [place], [place] [address]    Culture NO GROWTH < 24 HOURS  Final   Report Status PENDING  Incomplete  SARS CORONAVIRUS 2 (TAT 6-24 HRS) Nasopharyngeal Nasopharyngeal Swab     Status: None   Collection Time: 02/05/20  2:31 PM   Specimen: Nasopharyngeal Swab  Result Value Ref Range Status   SARS Coronavirus 2 NEGATIVE NEGATIVE Final    Comment: (NOTE) SARS-CoV-2 target nucleic acids are NOT DETECTED. The SARS-CoV-2 RNA is generally detectable in upper and lower respiratory specimens during the acute phase of infection. Negative results do not preclude SARS-CoV-2 infection, do not rule out co-infections with other pathogens, and should not be used  as the sole basis for treatment or other patient management decisions. Negative results must be combined with clinical observations, patient history, and epidemiological information. The expected result is Negative. Fact Sheet for Patients: SugarRoll.be Fact Sheet for Healthcare Providers: https://www.woods-mathews.com/ This test is not yet approved or cleared by the [place] FDA and  has been authorized for detection and/or diagnosis of SARS-CoV-2 by FDA under an Emergency Use Authorization (EUA). This EUA will remain  in effect (meaning this test can be used) for the duration of the COVID-19 declaration under Section 56 4(b)(1) of the Act, 21 U.S.C. section 360bbb-3(b)(1), unless the authorization is terminated or revoked sooner. Performed at [facility], [place] [address] [place], [place] [address]          Radiology Studies: CT Head Wo Contrast  Result Date: 02/05/2020 CLINICAL DATA:  The patient fell out of bed this morning at 8:30 a.m. Initial encounter. EXAM: CT HEAD WITHOUT CONTRAST CT CERVICAL SPINE WITHOUT CONTRAST TECHNIQUE: Multidetector CT imaging of the head and cervical spine was performed following the standard protocol without intravenous contrast. Multiplanar CT image reconstructions of the cervical spine were also generated. COMPARISON:  Head and cervical spine CT scan 08/31/2019. FINDINGS: CT HEAD FINDINGS Brain: No evidence of acute infarction, hemorrhage, hydrocephalus, extra-axial collection or mass lesion/mass effect. Atrophy, chronic microvascular ischemic change and remote lacunar infarctions in the right cerebellum and right corona radiata noted. Vascular: No hyperdense vessel or unexpected calcification. Skull: Intact.  No focal lesion. Sinuses/Orbits: Status post cataract surgery.  Otherwise negative. Other: None. CT CERVICAL SPINE FINDINGS Alignment: Maintained with straightening of lordosis noted. Skull base  and vertebrae: No acute fracture. No primary bone lesion or focal pathologic process. Soft tissues and spinal canal: No prevertebral fluid or swelling. No visible canal hematoma. Disc levels:  Multilevel loss of disc space height noted. Upper chest: Lung apices clear. Other: None. IMPRESSION: No acute abnormality head or cervical spine. Atrophy, chronic microvascular ischemic change and remote lacunar infarctions as  described above. Cervical spondylosis. Electronically Signed   By: Inge Rise M.D.   On: 02/05/2020 14:24   CT Cervical Spine Wo Contrast  Result Date: 02/05/2020 CLINICAL DATA:  The patient fell out of bed this morning at 8:30 a.m. Initial encounter. EXAM: CT HEAD WITHOUT CONTRAST CT CERVICAL SPINE WITHOUT CONTRAST TECHNIQUE: Multidetector CT imaging of the head and cervical spine was performed following the standard protocol without intravenous contrast. Multiplanar CT image reconstructions of the cervical spine were also generated. COMPARISON:  Head and cervical spine CT scan 08/31/2019. FINDINGS: CT HEAD FINDINGS Brain: No evidence of acute infarction, hemorrhage, hydrocephalus, extra-axial collection or mass lesion/mass effect. Atrophy, chronic microvascular ischemic change and remote lacunar infarctions in the right cerebellum and right corona radiata noted. Vascular: No hyperdense vessel or unexpected calcification. Skull: Intact.  No focal lesion. Sinuses/Orbits: Status post cataract surgery.  Otherwise negative. Other: None. CT CERVICAL SPINE FINDINGS Alignment: Maintained with straightening of lordosis noted. Skull base and vertebrae: No acute fracture. No primary bone lesion or focal pathologic process. Soft tissues and spinal canal: No prevertebral fluid or swelling. No visible canal hematoma. Disc levels:  Multilevel loss of disc space height noted. Upper chest: Lung apices clear. Other: None. IMPRESSION: No acute abnormality head or cervical spine. Atrophy, chronic microvascular  ischemic change and remote lacunar infarctions as described above. Cervical spondylosis. Electronically Signed   By: Inge Rise M.D.   On: 02/05/2020 14:24   CT LUMBAR SPINE WO CONTRAST  Result Date: 02/06/2020 CLINICAL DATA:  86 year old female status post fall out of bed at 0830 hours. Back pain. EXAM: CT LUMBAR SPINE WITHOUT CONTRAST TECHNIQUE: Multidetector CT imaging of the lumbar spine was performed without intravenous contrast administration. Multiplanar CT image reconstructions were also generated. COMPARISON:  Right hip CT yesterday. Lumbar CT 01/18/2014. CT Chest, Abdomen, and Pelvis 08/31/2019. FINDINGS: Segmentation: Normal. Alignment: Chronic severe levoconvex lumbar and partially visible dextroconvex thoracic scoliosis. Subsequent straightening of lumbar lordosis. Vertebrae: Chronic T11 compression fracture appears stable since September. T12 and lumbar vertebral bodies appear intact. Visible lower ribs appear intact. Visible sacrum and SI joints appear stable and intact. Paraspinal and other soft tissues: Aortoiliac calcified atherosclerosis. Vascular patency is not evaluated in the absence of IV contrast. Partially visible chronic gastric hiatal hernia. Retained stool in the visible colon. Distal colon diverticulosis. Negative noncontrast paraspinal soft tissues. Disc levels: Fairly capacious lower thoracic and lumbar spinal canal. Widespread chronic lumbar disc degeneration and vacuum disc. Chronic endplate spurring and moderate to severe right side facet arthropathy with chronic vacuum facet. Mild progression overall of degenerative changes since 2015. Mild to moderate multifactorial spinal stenosis at L3-L4 may have progressed since 2015. IMPRESSION: 1. No acute traumatic injury identified in the lumbar spine. Stable T11 compression fracture since September 2020. 2. Advanced chronic lumbar spine degeneration in the setting of severe scoliosis. Widespread vacuum disc and vacuum facet  phenomena. Moderate multifactorial spinal stenosis suspected at L3-L4 and probably progressed since 2015. 3. Aortic Atherosclerosis (ICD10-I70.0). Distal large bowel diverticulosis. Chronic hiatal hernia. Electronically Signed   By: Genevie Ann M.D.   On: 02/06/2020 11:46   CT Hip Right Wo Contrast  Result Date: 02/05/2020 CLINICAL DATA:  Right hip pain due to a fall out of bed at 8:30 a.m. this morning. Initial encounter. EXAM: CT OF THE RIGHT HIP WITHOUT CONTRAST TECHNIQUE: Multidetector CT imaging of the right hip was performed according to the standard protocol. Multiplanar CT image reconstructions were also generated. COMPARISON:  Plain films right hip  earlier today and 07/26/2019. FINDINGS: Bones/Joint/Cartilage Bipolar right hip hemiarthroplasty is again seen. The device is located. There is no fracture. Bones appear osteopenic. There is some degenerative disease about the right SI joint and symphysis pubis. No lytic or sclerotic lesion. Ligaments Suboptimally assessed by CT. Muscles and Tendons Appear normal. Soft tissues Sigmoid diverticulosis noted. IMPRESSION: No acute abnormality. Status post right hip replacement.  No hardware complication. Degenerative disease at the symphysis pubis and right SI joint. Sigmoid diverticulosis. Electronically Signed   By: Inge Rise M.D.   On: 02/05/2020 14:08   DG Chest Portable 1 View  Result Date: 02/05/2020 CLINICAL DATA:  86 year old female status post fall with suspected right hip fracture EXAM: PORTABLE CHEST 1 VIEW COMPARISON:  Prior chest x-ray 01/31/2019 FINDINGS: Low inspiratory volumes with streaky bibasilar airspace opacities likely representing atelectasis. Stable bronchitic changes. Cardiac and mediastinal contours are unchanged. Probable large hiatal hernia. Atherosclerotic calcifications visible in the transverse aorta. Remote healed left proximal humerus fracture. No acute fracture identified. Advanced levoconvex thoracolumbar scoliosis.  IMPRESSION: 1. No active cardiopulmonary disease. 2. Low inspiratory volumes with bibasilar atelectasis. 3. Stable chronic bronchitic changes. 4. Large hiatal hernia. Electronically Signed   By: Jacqulynn Cadet M.D.   On: 02/05/2020 12:40   DG HIPS BILAT WITH PELVIS 3-4 VIEWS  Result Date: 02/05/2020 CLINICAL DATA:  Left hip pain after a fall this morning. Bilateral hip pain after a fall this morning. Initial encounter. EXAM: DG HIP (WITH OR WITHOUT PELVIS) 3-4V BILAT COMPARISON:  Plain film of the pelvis 09/01/2019. FINDINGS: There is no acute bony or joint abnormality. The patient is status post right hip replacement and fixation of a healed left intertrochanteric fracture. IMPRESSION: No acute abnormality. Electronically Signed   By: Inge Rise M.D.   On: 02/05/2020 12:43        Scheduled Meds: . Chlorhexidine Gluconate Cloth  6 each Topical Daily  . enoxaparin (LOVENOX) injection  40 mg Subcutaneous Q24H  . mirabegron ER  25 mg Oral Daily  . pantoprazole  40 mg Oral Daily  . polyethylene glycol  17 g Oral Daily   Continuous Infusions:   LOS: 0 days    Time spent: 25 minutes with over 50% of the time coordinating the patient's care    Harold Hedge, DO [facility] Pager [phone]  If 7PM-7AM, please contact night-coverage [email or web address] Password [email or web address] 02/06/2020, 3:52 PM

## 2020-02-06 NOTE — Plan of Care (Signed)

## 2020-02-07 DIAGNOSIS — Z79899 Other long term (current) drug therapy: Secondary | ICD-10-CM

## 2020-02-07 LAB — CBC
HCT: 32 % — ABNORMAL LOW (ref 36.0–46.0)
Hemoglobin: 9.7 g/dL — ABNORMAL LOW (ref 12.0–15.0)
MCH: 27.2 pg (ref 26.0–34.0)
MCHC: 30.3 g/dL (ref 30.0–36.0)
MCV: 89.9 fL (ref 80.0–100.0)
Platelets: 215 10*3/uL (ref 150–400)
RBC: 3.56 MIL/uL — ABNORMAL LOW (ref 3.87–5.11)
RDW: 14.2 % (ref 11.5–15.5)
WBC: 7.9 10*3/uL (ref 4.0–10.5)
nRBC: 0 % (ref 0.0–0.2)

## 2020-02-07 LAB — BASIC METABOLIC PANEL
Anion gap: 6 (ref 5–15)
BUN: 23 mg/dL (ref 8–23)
CO2: 30 mmol/L (ref 22–32)
Calcium: 8.6 mg/dL — ABNORMAL LOW (ref 8.9–10.3)
Chloride: 103 mmol/L (ref 98–111)
Creatinine, Ser: 0.91 mg/dL (ref 0.44–1.00)
GFR calc Af Amer: >60 mL/min (ref >60)
GFR calc non Af Amer: 58 mL/min — ABNORMAL LOW (ref >60)
Glucose, Bld: 99 mg/dL (ref 70–99)
Potassium: 3.8 mmol/L (ref 3.5–5.1)
Sodium: 139 mmol/L (ref 135–145)

## 2020-02-07 LAB — URINE CULTURE: Culture: NO GROWTH

## 2020-02-07 LAB — MAGNESIUM: Magnesium: 1.6 mg/dL — ABNORMAL LOW (ref 1.7–2.4)

## 2020-02-07 MED ORDER — MAGNESIUM OXIDE 400 (241.3 MG) MG PO TABS
400.0000 mg | ORAL_TABLET | Freq: Once | ORAL | Status: AC
  Administered 2020-02-07: 400 mg via ORAL
  Filled 2020-02-07: qty 1

## 2020-02-07 NOTE — [place] FL2 (Signed)
[place] MEDICAID FL2 LEVEL OF CARE SCREENING TOOL     IDENTIFICATION  Patient Name: Angelica Bell Birthdate: 15-Mar-1932 Sex: female Admission Date (Current Location): 02/05/2020  [facility] and [place] Number:  Herbalist and Address:  [facility],  [place], [place]      Provider Number: [id number]  Attending Physician Name and Address:  Harold Hedge, MD  Relative Name and Phone Number:       Current Level of Care: Hospital Recommended Level of Care: [place] Prior Approval Number:    Date Approved/Denied:   PASRR Number: [id number]  Discharge Plan: SNF    Current Diagnoses: Patient Active Problem List   Diagnosis Date Noted  . Fall 02/06/2020  . AKI (acute kidney injury) ([place]) 02/05/2020  . UTI (urinary tract infection) 09/01/2019  . Rhabdomyolysis 09/01/2019  . Shock ([place]) 08/31/2019  . Acute cystitis 08/03/2019  . Confusion and disorientation 08/03/2019  . Cerebrovascular accident (CVA) ([place])   . ARF (acute renal failure) ([place]) 07/26/2019  . Hyponatremia 07/26/2019  . Abnormal liver function 07/26/2019  . Peripheral neuropathy   . Drug overdose   . Polypharmacy   . Altered mental status 10/25/2017  . Closed displaced intertrochanteric fracture of left femur ([place]) 02/16/2017  . Hip fracture, right ([place]) 12/25/2015  . Chronic pain 12/24/2015  . Fracture of femoral neck, right, closed ([place]) 12/24/2015  . Essential hypertension 12/24/2015  . Community acquired pneumonia 04/15/2013  . Hypokalemia 04/15/2013  . Labial cyst 02/08/2012  . POSTMENOPAUSAL SYNDROME 11/28/2009  . PERIPHERAL NEUROPATHY, LOWER EXTREMITY, RIGHT 02/22/2009  . INSOMNIA-SLEEP DISORDER-UNSPEC 10/12/2008  . Lumbago 08/02/2008  . 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 ANXIETY DEPRESSION 12/16/2007  . HYPERLIPIDEMIA 07/08/2007  . Osteoarthritis 07/08/2007  . Osteoporosis 07/08/2007  . SCOLIOSIS NEC 07/08/2007     Orientation RESPIRATION BLADDER Height & Weight     Self, Time, Situation, Place  Normal Continent Weight: 51.6 kg Height:  5' (152.4 cm)  BEHAVIORAL SYMPTOMS/MOOD NEUROLOGICAL BOWEL NUTRITION STATUS      Continent Diet  AMBULATORY STATUS COMMUNICATION OF NEEDS Skin   Extensive Assist Verbally Normal                       Personal Care Assistance Level of Assistance  Bathing, Dressing Bathing Assistance: Maximum assistance   Dressing Assistance: Maximum assistance     Functional Limitations Info             SPECIAL CARE FACTORS FREQUENCY  PT (By licensed PT), OT (By licensed OT)     PT Frequency: 5 x weekly OT Frequency: 5 x weekly            Contractures Contractures Info: Not present    Additional Factors Info  Code Status, Allergies Code Status Info: DNR Allergies Info: HCTZ, amoxicillin, Lisinopril-hydrochlorothiazide,Sulfa Antibiotics,Sulfonamide Derivatives           Current Medications (02/07/2020):  This is the current hospital active medication list Current Facility-Administered Medications  Medication Dose Route Frequency Provider Last Rate Last Admin  . Chlorhexidine Gluconate Cloth 2 % PADS 6 each  6 each Topical Daily Harold Hedge, MD   6 each at 02/06/20 1128  . gabapentin (NEURONTIN) capsule 100 mg  100 mg Oral TID Harold Hedge, MD   100 mg at 02/07/20 [id number]  . heparin injection 5,000 Units  5,000 Units Subcutaneous Q8H Harold Hedge, MD   5,000 Units at 02/07/20 0600  .  oxyCODONE (Oxy IR/ROXICODONE) immediate release tablet 10 mg  10 mg Oral TID PRN Harold Hedge, MD   10 mg at 02/07/20 0836  . pantoprazole (PROTONIX) EC tablet 40 mg  40 mg Oral Daily Fair, Marin Shutter, MD   40 mg at 02/07/20 [id number]  . polyethylene glycol (MIRALAX / GLYCOLAX) packet 17 g  17 g Oral Daily Fair, Chelsea N, MD      . traMADol (ULTRAM) tablet 50 mg  50 mg Oral Q6H PRN Harold Hedge, MD   50 mg at 02/07/20 0345     Discharge Medications: Please see  discharge summary for a list of discharge medications.  Relevant Imaging Results:  Relevant Lab Results:   Additional Information SSN: [id number]  Demetrie Borge, Marjie Skiff, RN

## 2020-02-07 NOTE — Progress Notes (Signed)
PROGRESS NOTE    Angelica Bell    Code Status: DNR  [id number] DOB: 04/09/1932 DOA: 02/05/2020  PCP: Heywood Bene, [facility] Summary  This is an 86 year old female with history of hypertension, chronic low back pain on opiates, recurrent falls, lives at home alone who presented after a mechanical fall at home after tripping on the sheets found to have an AKI, elevated CK and placed in observation.  Imaging negative for fractures.  Notable ED labs: WBC 14, creatinine 1.12, CK 1723.  Treated with IV fluids.  Medications reconciled as patient is on high-dose opiates, gabapentin, Lasix.  Complaining of worsened lumbar pain and found to have urinary retention on 2/7, Foley placed.  Unable to go for MRI lumbar spine due to prior hip fracture/repair.  Had CT lumbar spine with chronic degenerative changes and vacuum disc phenomenon which was discussed with IR and recommended conservative management.  PT recommending SNF.  Admitted to inpatient pending further pain management and safe discharge planning.  A & P   Principal Problem:   AKI (acute kidney injury) ([place]) Active Problems:   ANXIETY DEPRESSION   Chronic pain   Essential hypertension   Fall   1. AKI on CKD 3 secondary to rhabdomyolysis a. Resolved with IV fluids 2. Acute on chronic low back pain, on narcotics a. Takes oxycodone 30 mg 3 times daily at home b. Currently on 10 mg 3 times daily as needed for severe pain and tramadol as needed for moderate pain.  Seems to be tolerating c. PT recommending SNF, patient states she is willing to go to previous facilityat discharge [facility]?) as long as there is no COVID-19.  This has been relayed to SW 3. Neuropathy a. On gabapentin 200 mg 3 times daily at home b. Will renally dose gabapentin to 100 mg 3 times daily 4. Urinary retention of unknown etiology-possibly medication induced a. stopped Myrbetriq this admission b. Cannot get MRI lumbar spine due to  prior hip surgery hardware c. Foley inserted 2/7, void trial 5. Mild rhabdomyolysis secondary to fall a. Improved with IV fluids 6. Hypomagnesemia a. P.o. Mag-Ox 7. Status post mechanical fall, History of multiple falls and lives alone a. Reportedly after going from laying down to standing up, on admission stated tripped in the bed sheets. b. Attempt orthostatics if patient will tolerate c. More likely due to polypharmacy d. SNF at discharge  DVT prophylaxis: Subcu heparin Family Communication: Updated patient at bedside Disposition Plan: If patient can void on her own she will be medically stable for discharge.  patient states she is willing to go to previous facilityat discharge [facility]?) as long as there is no COVID-19.  This has been relayed to SW  Consultants  None  Procedures  None  Antibiotics   Anti-infectives (From admission, onward)   None       Subjective   States her pain is tolerable today but still hoping to have her oxycodone increased to previous regimen.  I explained to her that this was not a safe discharge plan and she was understandable.  States that she is willing to go to SNF as above as long as there is no COVID-19 and hopefully she can go back to her previous SNF.  Otherwise denies any complaints.  Interestingly, patient states she used to be in [place] in the 50s  Objective   Vitals:   02/06/20 2040 02/07/20 0543 02/07/20 0742 02/07/20 1056  BP: (!) 124/56 Marland Kitchen)  141/70 135/66 123/78  Pulse: 78 66 61 67  Resp: 16 16 16 16   Temp: 98.1 F (36.7 C) 98 F (36.7 C) 98.4 F (36.9 C) (!) 97.5 F (36.4 C)  TempSrc: Oral Oral Oral Oral  SpO2: 94% 96% 99% 95%  Weight:      Height:        Intake/Output Summary (Last 24 hours) at 02/07/2020 1259 Last data filed at 02/07/2020 1047 Gross per 24 hour  Intake 1560 ml  Output 1450 ml  Net 110 ml   Filed Weights   02/05/20 1642  Weight: 51.6 kg    Examination:  Physical Exam Vitals  and nursing note reviewed.  Constitutional:      Appearance: She is not ill-appearing.  HENT:     Head: Normocephalic.  Eyes:     Conjunctiva/sclera: Conjunctivae normal.  Cardiovascular:     Rate and Rhythm: Normal rate and regular rhythm.  Pulmonary:     Effort: Pulmonary effort is normal.  Abdominal:     General: There is no distension.     Tenderness: There is no abdominal tenderness.  Musculoskeletal:        General: No swelling or tenderness.  Neurological:     Mental Status: She is alert.  Psychiatric:        Mood and Affect: Mood normal.        Behavior: Behavior normal.     Data Reviewed: I have personally reviewed following labs and imaging studies  CBC: Recent Labs  Lab 02/05/20 1142 02/06/20 0431 02/07/20 0327  WBC 14.3* 7.3 7.9  NEUTROABS 12.2*  --   --   HGB 11.4* 9.2* 9.7*  HCT 37.9 30.1* 32.0*  MCV 90.7 89.6 89.9  PLT 310 209 [id number]   Basic Metabolic Panel: Recent Labs  Lab 02/05/20 1142 02/06/20 0431 02/06/20 0800 02/07/20 0327  NA 137 138  --  139  K 4.6 3.7  --  3.8  CL 97* 102  --  103  CO2 30 31  --  30  GLUCOSE 126* 111*  --  99  BUN 28* 24*  --  23  CREATININE 1.12* 0.89  --  0.91  CALCIUM 9.2 8.6*  --  8.6*  MG  --   --  1.7 1.6*  PHOS  --   --  3.1  --    GFR: Estimated Creatinine Clearance: 32.5 mL/min (by C-G formula based on SCr of 0.91 mg/dL). Liver Function Tests: Recent Labs  Lab 02/05/20 1142  AST 40  ALT 20  ALKPHOS 85  BILITOT 0.8  PROT 6.5  ALBUMIN 3.8   No results for input(s): LIPASE, AMYLASE in the last 168 hours. No results for input(s): AMMONIA in the last 168 hours. Coagulation Profile: No results for input(s): INR, PROTIME in the last 168 hours. Cardiac Enzymes: Recent Labs  Lab 02/05/20 1142 02/06/20 0431  CKTOTAL 1,723* 1,152*   BNP (last 3 results) No results for input(s): PROBNP in the last 8760 hours. HbA1C: No results for input(s): HGBA1C in the last 72 hours. CBG: Recent Labs  Lab  02/05/20 2222  GLUCAP 128*   Lipid Profile: No results for input(s): CHOL, HDL, LDLCALC, TRIG, CHOLHDL, LDLDIRECT in the last 72 hours. Thyroid Function Tests: No results for input(s): TSH, T4TOTAL, FREET4, T3FREE, THYROIDAB in the last 72 hours. Anemia Panel: No results for input(s): VITAMINB12, FOLATE, FERRITIN, TIBC, IRON, RETICCTPCT in the last 72 hours. Sepsis Labs: No results for input(s): PROCALCITON, LATICACIDVEN in the last  168 hours.  Recent Results (from the past 240 hour(s))  Urine Culture     Status: None   Collection Time: 02/05/20 11:42 AM   Specimen: Urine, Random  Result Value Ref Range Status   Specimen Description URINE, RANDOM  Final   Special Requests   Final    NONE Performed at [facility] [address], [place], [place] [address]    Culture NO GROWTH  Final   Report Status 02/07/2020 FINAL  Final  Blood culture (routine x 2)     Status: None (Preliminary result)   Collection Time: 02/05/20 11:53 AM   Specimen: BLOOD  Result Value Ref Range Status   Specimen Description BLOOD LEFT ANTECUBITAL  Final   Special Requests   Final    BAA Blood Culture adequate volume Performed at [facility] [address] [place], [place] [address]    Culture NO GROWTH < 24 HOURS  Final   Report Status PENDING  Incomplete  Blood culture (routine x 2)     Status: None (Preliminary result)   Collection Time: 02/05/20 11:54 AM   Specimen: BLOOD  Result Value Ref Range Status   Specimen Description BLOOD RIGHT ANTECUBITAL  Final   Special Requests   Final    AEB Blood Culture results may not be optimal due to an inadequate volume of blood received in culture bottles Performed at [facility], [place] [address] [place], [place] [address]    Culture NO GROWTH < 24 HOURS  Final   Report Status PENDING  Incomplete  SARS CORONAVIRUS 2 (TAT 6-24 HRS) Nasopharyngeal Nasopharyngeal Swab     Status: None   Collection  Time: 02/05/20  2:31 PM   Specimen: Nasopharyngeal Swab  Result Value Ref Range Status   SARS Coronavirus 2 NEGATIVE NEGATIVE Final    Comment: (NOTE) SARS-CoV-2 target nucleic acids are NOT DETECTED. The SARS-CoV-2 RNA is generally detectable in upper and lower respiratory specimens during the acute phase of infection. Negative results do not preclude SARS-CoV-2 infection, do not rule out co-infections with other pathogens, and should not be used as the sole basis for treatment or other patient management decisions. Negative results must be combined with clinical observations, patient history, and epidemiological information. The expected result is Negative. Fact Sheet for Patients: SugarRoll.be Fact Sheet for Healthcare Providers: https://www.woods-mathews.com/ This test is not yet approved or cleared by the [place] FDA and  has been authorized for detection and/or diagnosis of SARS-CoV-2 by FDA under an Emergency Use Authorization (EUA). This EUA will remain  in effect (meaning this test can be used) for the duration of the COVID-19 declaration under Section 56 4(b)(1) of the Act, 21 U.S.C. section 360bbb-3(b)(1), unless the authorization is terminated or revoked sooner. Performed at [facility], [place] [address], [place], [place] [address]          Radiology Studies: CT Head Wo Contrast  Result Date: 02/05/2020 CLINICAL DATA:  The patient fell out of bed this morning at 8:30 a.m. Initial encounter. EXAM: CT HEAD WITHOUT CONTRAST CT CERVICAL SPINE WITHOUT CONTRAST TECHNIQUE: Multidetector CT imaging of the head and cervical spine was performed following the standard protocol without intravenous contrast. Multiplanar CT image reconstructions of the cervical spine were also generated. COMPARISON:  Head and cervical spine CT scan 08/31/2019. FINDINGS: CT HEAD FINDINGS Brain: No evidence of acute infarction, hemorrhage,  hydrocephalus, extra-axial collection or mass lesion/mass effect. Atrophy, chronic microvascular ischemic change and remote lacunar  infarctions in the right cerebellum and right corona radiata noted. Vascular: No hyperdense vessel or unexpected calcification. Skull: Intact.  No focal lesion. Sinuses/Orbits: Status post cataract surgery.  Otherwise negative. Other: None. CT CERVICAL SPINE FINDINGS Alignment: Maintained with straightening of lordosis noted. Skull base and vertebrae: No acute fracture. No primary bone lesion or focal pathologic process. Soft tissues and spinal canal: No prevertebral fluid or swelling. No visible canal hematoma. Disc levels:  Multilevel loss of disc space height noted. Upper chest: Lung apices clear. Other: None. IMPRESSION: No acute abnormality head or cervical spine. Atrophy, chronic microvascular ischemic change and remote lacunar infarctions as described above. Cervical spondylosis. Electronically Signed   By: Inge Rise M.D.   On: 02/05/2020 14:24   CT Cervical Spine Wo Contrast  Result Date: 02/05/2020 CLINICAL DATA:  The patient fell out of bed this morning at 8:30 a.m. Initial encounter. EXAM: CT HEAD WITHOUT CONTRAST CT CERVICAL SPINE WITHOUT CONTRAST TECHNIQUE: Multidetector CT imaging of the head and cervical spine was performed following the standard protocol without intravenous contrast. Multiplanar CT image reconstructions of the cervical spine were also generated. COMPARISON:  Head and cervical spine CT scan 08/31/2019. FINDINGS: CT HEAD FINDINGS Brain: No evidence of acute infarction, hemorrhage, hydrocephalus, extra-axial collection or mass lesion/mass effect. Atrophy, chronic microvascular ischemic change and remote lacunar infarctions in the right cerebellum and right corona radiata noted. Vascular: No hyperdense vessel or unexpected calcification. Skull: Intact.  No focal lesion. Sinuses/Orbits: Status post cataract surgery.  Otherwise negative. Other: None.  CT CERVICAL SPINE FINDINGS Alignment: Maintained with straightening of lordosis noted. Skull base and vertebrae: No acute fracture. No primary bone lesion or focal pathologic process. Soft tissues and spinal canal: No prevertebral fluid or swelling. No visible canal hematoma. Disc levels:  Multilevel loss of disc space height noted. Upper chest: Lung apices clear. Other: None. IMPRESSION: No acute abnormality head or cervical spine. Atrophy, chronic microvascular ischemic change and remote lacunar infarctions as described above. Cervical spondylosis. Electronically Signed   By: Inge Rise M.D.   On: 02/05/2020 14:24   CT LUMBAR SPINE WO CONTRAST  Result Date: 02/06/2020 CLINICAL DATA:  86 year old female status post fall out of bed at 0830 hours. Back pain. EXAM: CT LUMBAR SPINE WITHOUT CONTRAST TECHNIQUE: Multidetector CT imaging of the lumbar spine was performed without intravenous contrast administration. Multiplanar CT image reconstructions were also generated. COMPARISON:  Right hip CT yesterday. Lumbar CT 01/18/2014. CT Chest, Abdomen, and Pelvis 08/31/2019. FINDINGS: Segmentation: Normal. Alignment: Chronic severe levoconvex lumbar and partially visible dextroconvex thoracic scoliosis. Subsequent straightening of lumbar lordosis. Vertebrae: Chronic T11 compression fracture appears stable since September. T12 and lumbar vertebral bodies appear intact. Visible lower ribs appear intact. Visible sacrum and SI joints appear stable and intact. Paraspinal and other soft tissues: Aortoiliac calcified atherosclerosis. Vascular patency is not evaluated in the absence of IV contrast. Partially visible chronic gastric hiatal hernia. Retained stool in the visible colon. Distal colon diverticulosis. Negative noncontrast paraspinal soft tissues. Disc levels: Fairly capacious lower thoracic and lumbar spinal canal. Widespread chronic lumbar disc degeneration and vacuum disc. Chronic endplate spurring and moderate to  severe right side facet arthropathy with chronic vacuum facet. Mild progression overall of degenerative changes since 2015. Mild to moderate multifactorial spinal stenosis at L3-L4 may have progressed since 2015. IMPRESSION: 1. No acute traumatic injury identified in the lumbar spine. Stable T11 compression fracture since September 2020. 2. Advanced chronic lumbar spine degeneration in the setting of severe scoliosis. Widespread vacuum disc  and vacuum facet phenomena. Moderate multifactorial spinal stenosis suspected at L3-L4 and probably progressed since 2015. 3. Aortic Atherosclerosis (ICD10-I70.0). Distal large bowel diverticulosis. Chronic hiatal hernia. Electronically Signed   By: Genevie Ann M.D.   On: 02/06/2020 11:46   CT Hip Right Wo Contrast  Result Date: 02/05/2020 CLINICAL DATA:  Right hip pain due to a fall out of bed at 8:30 a.m. this morning. Initial encounter. EXAM: CT OF THE RIGHT HIP WITHOUT CONTRAST TECHNIQUE: Multidetector CT imaging of the right hip was performed according to the standard protocol. Multiplanar CT image reconstructions were also generated. COMPARISON:  Plain films right hip earlier today and 07/26/2019. FINDINGS: Bones/Joint/Cartilage Bipolar right hip hemiarthroplasty is again seen. The device is located. There is no fracture. Bones appear osteopenic. There is some degenerative disease about the right SI joint and symphysis pubis. No lytic or sclerotic lesion. Ligaments Suboptimally assessed by CT. Muscles and Tendons Appear normal. Soft tissues Sigmoid diverticulosis noted. IMPRESSION: No acute abnormality. Status post right hip replacement.  No hardware complication. Degenerative disease at the symphysis pubis and right SI joint. Sigmoid diverticulosis. Electronically Signed   By: Inge Rise M.D.   On: 02/05/2020 14:08        Scheduled Meds: . Chlorhexidine Gluconate Cloth  6 each Topical Daily  . gabapentin  100 mg Oral TID  . heparin  5,000 Units Subcutaneous  Q8H  . pantoprazole  40 mg Oral Daily  . polyethylene glycol  17 g Oral Daily   Continuous Infusions:   LOS: 1 day    Time spent: 32minutes with over 50% of the time coordinating the patient's care    Harold Hedge, DO [facility] Pager [phone]  If 7PM-7AM, please contact night-coverage [email or web address] Password [email or web address] 02/07/2020, 12:59 PM

## 2020-02-07 NOTE — TOC Initial Note (Signed)
Transition of Care [facility]) - Initial/Assessment Note    Patient Details  Name: Angelica Bell MRN: [id number] Date of Birth: 13-Jun-1932  Transition of Care [facility]) CM/SW Contact:    Lynnell Catalan, RN Phone Number: 02/07/2020, 1:00 PM  Clinical Narrative:                 This CM was alerted by previous CSW that pt had now agreed to snf (previously declined). FL2 faxed out to area facilities.      Activities of Daily Living Home Assistive Devices/Equipment: Wheelchair ADL Screening (condition at time of admission) Patient's cognitive ability adequate to safely complete daily activities?: No Is the patient deaf or have difficulty hearing?: Yes Does the patient have difficulty seeing, even when wearing glasses/contacts?: Yes Does the patient have difficulty concentrating, remembering, or making decisions?: Yes Patient able to express need for assistance with ADLs?: Yes Does the patient have difficulty dressing or bathing?: Yes Independently performs ADLs?: No Communication: Independent Dressing (OT): Needs assistance Bathing: Needs assistance Toileting: Needs assistance Walks in Home: Needs assistance Does the patient have difficulty walking or climbing stairs?: Yes Weakness of Legs: Both Weakness of Arms/Hands: Both   Admission diagnosis:  Fall [W19.XXXA] Elevated CK [R74.8] AKI (acute kidney injury) ([place]) [N17.9] Fall, initial encounter [W19.XXXA] Patient Active Problem List   Diagnosis Date Noted  . Fall 02/06/2020  . AKI (acute kidney injury) ([place]) 02/05/2020  . UTI (urinary tract infection) 09/01/2019  . Rhabdomyolysis 09/01/2019  . Shock ([place]) 08/31/2019  . Acute cystitis 08/03/2019  . Confusion and disorientation 08/03/2019  . Cerebrovascular accident (CVA) ([place])   . ARF (acute renal failure) ([place]) 07/26/2019  . Hyponatremia 07/26/2019  . Abnormal liver function 07/26/2019  . Peripheral neuropathy   . Drug overdose   . Polypharmacy   . Altered mental status  10/25/2017  . Closed displaced intertrochanteric fracture of left femur ([place]) 02/16/2017  . Hip fracture, right ([place]) 12/25/2015  . Chronic pain 12/24/2015  . Fracture of femoral neck, right, closed ([place]) 12/24/2015  . Essential hypertension 12/24/2015  . Community acquired pneumonia 04/15/2013  . Hypokalemia 04/15/2013  . Labial cyst 02/08/2012  . POSTMENOPAUSAL SYNDROME 11/28/2009  . PERIPHERAL NEUROPATHY, LOWER EXTREMITY, RIGHT 02/22/2009  . INSOMNIA-SLEEP DISORDER-UNSPEC 10/12/2008  . Lumbago 08/02/2008  . 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 ANXIETY DEPRESSION 12/16/2007  . HYPERLIPIDEMIA 07/08/2007  . Osteoarthritis 07/08/2007  . Osteoporosis 07/08/2007  . SCOLIOSIS NEC 07/08/2007   PCP:  Heywood Bene, PA-C Pharmacy:   [facility], [place] - [address] [place] [address] Phone: [phone] Fax: [phone]  [facility] [place], [place] - [address] [place] [address] [place] [address] Phone: [phone] Fax: [phone]     Social Determinants of Health (SDOH) Interventions    Readmission Risk Interventions No flowsheet data found.

## 2020-02-08 LAB — BASIC METABOLIC PANEL
Anion gap: 5 (ref 5–15)
BUN: 23 mg/dL (ref 8–23)
CO2: 31 mmol/L (ref 22–32)
Calcium: 8.7 mg/dL — ABNORMAL LOW (ref 8.9–10.3)
Chloride: 102 mmol/L (ref 98–111)
Creatinine, Ser: 0.82 mg/dL (ref 0.44–1.00)
GFR calc Af Amer: >60 mL/min (ref >60)
GFR calc non Af Amer: >60 mL/min (ref >60)
Glucose, Bld: 103 mg/dL — ABNORMAL HIGH (ref 70–99)
Potassium: 3.8 mmol/L (ref 3.5–5.1)
Sodium: 138 mmol/L (ref 135–145)

## 2020-02-08 LAB — CBC
HCT: 33.2 % — ABNORMAL LOW (ref 36.0–46.0)
Hemoglobin: 10.6 g/dL — ABNORMAL LOW (ref 12.0–15.0)
MCH: 28.1 pg (ref 26.0–34.0)
MCHC: 31.9 g/dL (ref 30.0–36.0)
MCV: 88.1 fL (ref 80.0–100.0)
Platelets: 211 10*3/uL (ref 150–400)
RBC: 3.77 MIL/uL — ABNORMAL LOW (ref 3.87–5.11)
RDW: 14 % (ref 11.5–15.5)
WBC: 9.3 10*3/uL (ref 4.0–10.5)
nRBC: 0 % (ref 0.0–0.2)

## 2020-02-08 LAB — MAGNESIUM: Magnesium: 1.6 mg/dL — ABNORMAL LOW (ref 1.7–2.4)

## 2020-02-08 MED ORDER — MAGNESIUM OXIDE 400 (241.3 MG) MG PO TABS
400.0000 mg | ORAL_TABLET | Freq: Two times a day (BID) | ORAL | Status: DC
  Administered 2020-02-08: 400 mg via ORAL
  Filled 2020-02-08: qty 1

## 2020-02-08 MED ORDER — MAGNESIUM OXIDE 400 (241.3 MG) MG PO TABS: 400.0000 mg | ORAL_TABLET | Freq: Two times a day (BID) | ORAL | 0 refills | Status: AC

## 2020-02-08 MED ORDER — OXYCODONE HCL 5 MG PO TABS: 15.0000 mg | ORAL_TABLET | Freq: Three times a day (TID) | ORAL | Status: DC | PRN

## 2020-02-08 MED ORDER — OXYCODONE HCL 30 MG PO TABS: 15.0000 mg | ORAL_TABLET | Freq: Three times a day (TID) | ORAL | 0 refills | Status: DC | PRN

## 2020-02-08 MED ORDER — GABAPENTIN 100 MG PO CAPS: 100.0000 mg | ORAL_CAPSULE | Freq: Three times a day (TID) | ORAL | 0 refills | Status: DC

## 2020-02-08 NOTE — Progress Notes (Signed)
Patient ambulated on walker to bathroom and voided.

## 2020-02-08 NOTE — Progress Notes (Signed)
Patient Dc'd today to [facility]. Report given on timely manner. Waiting for transport. Iv lines removed tip intact

## 2020-02-08 NOTE — Discharge Summary (Signed)
Physician Discharge Summary  Angelica Bell [id number] DOB: 08-Sep-1932 DOA: 02/05/2020  PCP: Heywood Bene, PA-C  Admit date: 02/05/2020 Discharge date: 02/08/2020   Code Status: DNR  Admitted From: Home Discharged to: SNF Discharge Condition: Stable  Recommendations for Outpatient Follow-up   1. Follow up with PCP in 1 week 2. Please follow up BMP/CBC/Mg 3. Started on magnesium segments 4. Gabapentin renally dosed 5. Decreased oxycodone to 15 mg 3 times daily as needed (patient has prescription at home and should not be discharged from SNF with extra pills but directed to take half) 6. Monitor for polypharmacy 7. Myrbetriq discontinued due to urinary retention  Hospital Summary  This is an 86 year old female with history of hypertension, chronic low back pain on opiates, recurrent falls, lives at home alone who presented after a mechanical fall at home after tripping on the sheets found to have an AKI, elevated CK and placed in observation.  Imaging negative for fractures.  Notable ED labs: WBC 14, creatinine 1.12, CK 1723.  Treated with IV fluids.  Medications reconciled as patient is on high-dose opiates, gabapentin, Lasix.  Complaining of worsened lumbar pain and found to have urinary retention on 2/7, Foley placed.  Unable to go for MRI lumbar spine due to prior hip fracture/repair.  Had CT lumbar spine with chronic degenerative changes and vacuum disc phenomenon which was discussed with IR and recommended conservative management.    Foley removed 2/8 and voided successfully.  Had improvement in CK with IV fluids.    Medication reconciliation: Decreased oxycodone 30 mg 3 times daily as needed to 15 mg 3 times daily as needed; discontinued Myrbetriq; renally dosed gabapentin  Discharged in stable condition to SNF   A & P   Principal Problem:   AKI (acute kidney injury) ([place]) Active Problems:   ANXIETY DEPRESSION   Chronic pain   Essential hypertension   Fall    Hypomagnesemia     1. AKI on CKD 3 secondary to rhabdomyolysis a. Resolved with IV fluids b. Follow-up repeat labs 2. Acute on chronic low back pain, on narcotics a. Takes oxycodone 30 mg 3 times as needed daily at home - > 15 mg 3 times daily as needed.  PMP checked, patient has 30 mg tablets at home 3. Neuropathy a. On gabapentin 200 mg 3 times daily at home b. Will renally dose gabapentin to 100 mg 3 times daily 4. Urinary retention of unknown etiology-likely medication induced a. stopped Myrbetriq this admission b. Foley inserted 2/7-> 2/8, successful void trial 5. Mild rhabdomyolysis secondary to fall a. Improved with IV fluids 6. Hypomagnesemia a. P.o. Mag-Ox and repeat labs 7. Status post mechanical fall, History of multiple falls and lives alone a. Reportedly after going from laying down to standing up, on admission stated tripped in the bed sheets. b. Attempt orthostatics if patient will tolerate c. More likely due to polypharmacy d. SNF at discharge    Consultants  . None  Procedures  . None  Antibiotics   Anti-infectives (From admission, onward)   None        Subjective  Patient seen and examined at bedside no acute distress and resting comfortably.  No events overnight.  Tolerating diet. In good spirits and anticipating discharge.   Denies any chest pain, shortness of breath, fever, nausea, urinary.  Still with some back pain.  Otherwise ROS negative   Objective   Discharge Exam: Vitals:   02/07/20 2022 02/08/20 0550  BP: 135/66 131/63  Pulse:  65 66  Resp: 16 16  Temp: 98 F (36.7 C) 98.5 F (36.9 C)  SpO2: 96% 92%   Vitals:   02/07/20 0742 02/07/20 1056 02/07/20 2022 02/08/20 0550  BP: 135/66 123/78 135/66 131/63  Pulse: 61 67 65 66  Resp: 16 16 16 16   Temp: 98.4 F (36.9 C) (!) 97.5 F (36.4 C) 98 F (36.7 C) 98.5 F (36.9 C)  TempSrc: Oral Oral Oral Oral  SpO2: 99% 95% 96% 92%  Weight:      Height:        Physical  Exam Vitals and nursing note reviewed.  Constitutional:      Appearance: Normal appearance.  HENT:     Head: Normocephalic and atraumatic.  Eyes:     Conjunctiva/sclera: Conjunctivae normal.  Cardiovascular:     Rate and Rhythm: Normal rate and regular rhythm.  Pulmonary:     Effort: Pulmonary effort is normal.     Breath sounds: Normal breath sounds.  Abdominal:     General: Abdomen is flat.     Palpations: Abdomen is soft.  Musculoskeletal:     Right lower leg: No edema.     Left lower leg: No edema.  Skin:    Coloration: Skin is not jaundiced or pale.  Neurological:     Mental Status: She is alert. Mental status is at baseline.  Psychiatric:        Mood and Affect: Mood normal.        Behavior: Behavior normal.       The results of significant diagnostics from this hospitalization (including imaging, microbiology, ancillary and laboratory) are listed below for reference.     Microbiology: Recent Results (from the past 240 hour(s))  Urine Culture     Status: None   Collection Time: 02/05/20 11:42 AM   Specimen: Urine, Random  Result Value Ref Range Status   Specimen Description URINE, RANDOM  Final   Special Requests   Final    NONE Performed at [facility] [address] [place], [place] [address]    Culture NO GROWTH  Final   Report Status 02/07/2020 FINAL  Final  Blood culture (routine x 2)     Status: None (Preliminary result)   Collection Time: 02/05/20 11:53 AM   Specimen: BLOOD  Result Value Ref Range Status   Specimen Description   Final    BLOOD LEFT ANTECUBITAL Performed at [facility] [address] [place], [place] [address]    Special Requests   Final    BAA Blood Culture adequate volume Performed at [facility] [address] [place], [place] [address]    Culture   Final    NO GROWTH 3 DAYS Performed at [facility], [place] [address] [place], [place] [address]     Report Status PENDING  Incomplete  Blood culture (routine x 2)     Status: None (Preliminary result)   Collection Time: 02/05/20 11:54 AM   Specimen: BLOOD  Result Value Ref Range Status   Specimen Description   Final    BLOOD RIGHT ANTECUBITAL Performed at [facility] [address] [place], [place] [address]    Special Requests   Final    AEB Blood Culture results may not be optimal due to an inadequate volume of blood received in culture bottles Performed at [facility] [address] [place], [place] [address]    Culture   Final  NO GROWTH 3 DAYS Performed at [facility], [place] [address] [place], [place] [address]    Report Status PENDING  Incomplete  SARS CORONAVIRUS 2 (TAT 6-24 HRS) Nasopharyngeal Nasopharyngeal Swab     Status: None   Collection Time: 02/05/20  2:31 PM   Specimen: Nasopharyngeal Swab  Result Value Ref Range Status   SARS Coronavirus 2 NEGATIVE NEGATIVE Final    Comment: (NOTE) SARS-CoV-2 target nucleic acids are NOT DETECTED. The SARS-CoV-2 RNA is generally detectable in upper and lower respiratory specimens during the acute phase of infection. Negative results do not preclude SARS-CoV-2 infection, do not rule out co-infections with other pathogens, and should not be used as the sole basis for treatment or other patient management decisions. Negative results must be combined with clinical observations, patient history, and epidemiological information. The expected result is Negative. Fact Sheet for Patients: SugarRoll.be Fact Sheet for Healthcare Providers: https://www.woods-mathews.com/ This test is not yet approved or cleared by the [place] FDA and  has been authorized for detection and/or diagnosis of SARS-CoV-2 by FDA under an Emergency Use Authorization (EUA). This EUA will remain  in effect (meaning this test can be used) for the duration of  the COVID-19 declaration under Section 56 4(b)(1) of the Act, 21 U.S.C. section 360bbb-3(b)(1), unless the authorization is terminated or revoked sooner. Performed at [facility], [place] [address] [place], [place] [address]      Labs: BNP (last 3 results) No results for input(s): BNP in the last 8760 hours. Basic Metabolic Panel: Recent Labs  Lab 02/05/20 1142 02/06/20 0431 02/06/20 0800 02/07/20 0327 02/08/20 0514  NA 137 138  --  139 138  K 4.6 3.7  --  3.8 3.8  CL 97* 102  --  103 102  CO2 30 31  --  30 31  GLUCOSE 126* 111*  --  99 103*  BUN 28* 24*  --  23 23  CREATININE 1.12* 0.89  --  0.91 0.82  CALCIUM 9.2 8.6*  --  8.6* 8.7*  MG  --   --  1.7 1.6* 1.6*  PHOS  --   --  3.1  --   --    Liver Function Tests: Recent Labs  Lab 02/05/20 1142  AST 40  ALT 20  ALKPHOS 85  BILITOT 0.8  PROT 6.5  ALBUMIN 3.8   No results for input(s): LIPASE, AMYLASE in the last 168 hours. No results for input(s): AMMONIA in the last 168 hours. CBC: Recent Labs  Lab 02/05/20 1142 02/06/20 0431 02/07/20 0327 02/08/20 0514  WBC 14.3* 7.3 7.9 9.3  NEUTROABS 12.2*  --   --   --   HGB 11.4* 9.2* 9.7* 10.6*  HCT 37.9 30.1* 32.0* 33.2*  MCV 90.7 89.6 89.9 88.1  PLT 310 209 215 211   Cardiac Enzymes: Recent Labs  Lab 02/05/20 1142 02/06/20 0431  CKTOTAL 1,723* 1,152*   BNP: Invalid input(s): POCBNP CBG: Recent Labs  Lab 02/05/20 2222  GLUCAP 128*   D-Dimer No results for input(s): DDIMER in the last 72 hours. Hgb A1c No results for input(s): HGBA1C in the last 72 hours. Lipid Profile No results for input(s): CHOL, HDL, LDLCALC, TRIG, CHOLHDL, LDLDIRECT in the last 72 hours. Thyroid function studies No results for input(s): TSH, T4TOTAL, T3FREE, THYROIDAB in the last 72 hours.  Invalid input(s): FREET3 Anemia work up No results for input(s): VITAMINB12, FOLATE, FERRITIN, TIBC, IRON, RETICCTPCT in the last 72 hours. Urinalysis  Component Value  Date/Time   COLORURINE YELLOW 02/05/2020 1142   APPEARANCEUR CLEAR 02/05/2020 1142   LABSPEC 1.017 02/05/2020 1142   PHURINE 6.0 02/05/2020 1142   GLUCOSEU NEGATIVE 02/05/2020 1142   HGBUR NEGATIVE 02/05/2020 1142   HGBUR negative 04/24/2009 1635   BILIRUBINUR NEGATIVE 02/05/2020 1142   BILIRUBINUR negative 05/29/2011 1439   KETONESUR NEGATIVE 02/05/2020 1142   PROTEINUR NEGATIVE 02/05/2020 1142   UROBILINOGEN 0.2 01/26/2014 1912   NITRITE NEGATIVE 02/05/2020 1142   LEUKOCYTESUR NEGATIVE 02/05/2020 1142   Sepsis Labs Invalid input(s): PROCALCITONIN,  WBC,  LACTICIDVEN Microbiology Recent Results (from the past 240 hour(s))  Urine Culture     Status: None   Collection Time: 02/05/20 11:42 AM   Specimen: Urine, Random  Result Value Ref Range Status   Specimen Description URINE, RANDOM  Final   Special Requests   Final    NONE Performed at [facility], [place] [address] [place], [place] [address]    Culture NO GROWTH  Final   Report Status 02/07/2020 FINAL  Final  Blood culture (routine x 2)     Status: None (Preliminary result)   Collection Time: 02/05/20 11:53 AM   Specimen: BLOOD  Result Value Ref Range Status   Specimen Description   Final    BLOOD LEFT ANTECUBITAL Performed at [facility] [address] [place], [place] [address]    Special Requests   Final    BAA Blood Culture adequate volume Performed at [facility] [address] [place], [place] [address]    Culture   Final    NO GROWTH 3 DAYS Performed at [facility], [place] [address], [place], [place] [address]    Report Status PENDING  Incomplete  Blood culture (routine x 2)     Status: None (Preliminary result)   Collection Time: 02/05/20 11:54 AM   Specimen: BLOOD  Result Value Ref Range Status   Specimen Description   Final    BLOOD RIGHT ANTECUBITAL Performed at [facility] [address] [place], [place]  [address]    Special Requests   Final    AEB Blood Culture results may not be optimal due to an inadequate volume of blood received in culture bottles Performed at [facility] [address] [place], [place] [address]    Culture   Final    NO GROWTH 3 DAYS Performed at [facility], [place] [address] [place], [place] [address]    Report Status PENDING  Incomplete  SARS CORONAVIRUS 2 (TAT 6-24 HRS) Nasopharyngeal Nasopharyngeal Swab     Status: None   Collection Time: 02/05/20  2:31 PM   Specimen: Nasopharyngeal Swab  Result Value Ref Range Status   SARS Coronavirus 2 NEGATIVE NEGATIVE Final    Comment: (NOTE) SARS-CoV-2 target nucleic acids are NOT DETECTED. The SARS-CoV-2 RNA is generally detectable in upper and lower respiratory specimens during the acute phase of infection. Negative results do not preclude SARS-CoV-2 infection, do not rule out co-infections with other pathogens, and should not be used as the sole basis for treatment or other patient management decisions. Negative results must be combined with clinical observations, patient history, and epidemiological information. The expected result is Negative. Fact Sheet for Patients: SugarRoll.be Fact Sheet for Healthcare Providers: https://www.woods-mathews.com/ This test is not yet approved or cleared by the [place] FDA and  has been authorized for detection and/or diagnosis of SARS-CoV-2 by FDA under an  Emergency Use Authorization (EUA). This EUA will remain  in effect (meaning this test can be used) for the duration of the COVID-19 declaration under Section 56 4(b)(1) of the Act, 21 U.S.C. section 360bbb-3(b)(1), unless the authorization is terminated or revoked sooner. Performed at [facility], [place] [address], [place], [place] [address]     Discharge Instructions     Discharge Instructions    Diet - low sodium heart healthy   Complete  by: As directed    Discharge instructions   Complete by: As directed    You were seen and examined in the hospital for fall, likely medication induced and cared for by a hospitalist.   Upon Discharge:  - You have had multiple medication changes:   - Take 15 mg Oxycodone (1/2 tablet) three times per day as needed for severe pain. This is decreased from 30 mg three times daily as needed.    - Take Gabapentin 100 mg three times daily   - Stop taking Mirabegron (myrbetriq)   - Take magnesium twice daily for the next three days - Make an appointment with your primary care physician once you are discharged from the physical therapy rehab and Get lab work prior to your follow up appointment with your PCP Bring all home medications to your appointment to review Request that your primary physician go over all hospital tests and procedures/radiological results at the follow up.   Please get all hospital records sent to your physician by signing a hospital release before you go home.     Read the complete instructions along with all the possible side effects for all the medicines you take and that have been prescribed to you. Take any new medicines after you have completely understood and accept all the possible adverse reactions/side effects.   If you have any questions about your discharge medications or the care you received while you were in the hospital, you can call the unit and asked to speak with the hospitalist on call. Once you are discharged, your primary care physician will handle any further medical issues. Please note that NO REFILLS for any discharge medications will be authorized, as it is imperative that you return to your primary care physician (or establish a relationship with a primary care physician if you do not have one) for your aftercare needs so that they can reassess your need for medications and monitor your lab values.   Do not drive, operate heavy machinery, perform  activities at heights, swimming or participation in water activities or provide baby sitting services if your were admitted for loss of consciousness/seizures or if you are on sedating medications including, but not limited to benzodiazepines, sleep medications, narcotic pain medications, etc., until you have been cleared to do so by a medical doctor.   Do not take more than prescribed medications.   Wear a seat belt while driving.  If you have smoked or chewed Tobacco in the last 2 years please stop smoking; also stop any regular Alcohol and/or any Recreational drug use including marijuana.  If you experience worsening of your admission symptoms or develop shortness of breath, chest pain, suicidal or homicidal thoughts or experience a life threatening emergency, you must seek medical attention immediately by calling 911 or calling your PCP immediately.   Increase activity slowly   Complete by: As directed      Allergies as of 02/08/2020      Reactions   Hctz [hydrochlorothiazide]    Significant and  rapid hyponatremia (TIH)   Amoxicillin-pot Clavulanate Hives, Diarrhea   Has patient had a PCN reaction causing immediate rash, facial/tongue/throat swelling, SOB or lightheadedness with hypotension: yes Has patient had a PCN reaction causing severe rash involving mucus membranes or skin necrosis: yes Has patient had a PCN reaction that required hospitalization : unknown Has patient had a PCN reaction occurring within the last 10 years: unknown If all of the above answers are "NO", then may proceed with Cephalosporin use.   Lisinopril-hydrochlorothiazide Other (See Comments)   Tired- no energy to do anything..   Sulfa Antibiotics Hives, Nausea And Vomiting   Sulfonamide Derivatives Hives      Medication List    STOP taking these medications   mirabegron ER 25 MG Tb24 tablet Commonly known as: MYRBETRIQ   Muscle Rub 10-15 % Crea     TAKE these medications   amLODipine 10 MG  tablet Commonly known as: NORVASC Take 1 tablet (10 mg total) by mouth daily.   aspirin 81 MG EC tablet Take 1 tablet (81 mg total) by mouth daily.   atorvastatin 10 MG tablet Commonly known as: LIPITOR Take 1 tablet (10 mg total) by mouth daily at 6 PM.   escitalopram 10 MG tablet Commonly known as: LEXAPRO Take 10 mg by mouth daily. What changed: Another medication with the same name was removed. Continue taking this medication, and follow the directions you see here.   ferrous sulfate 325 (65 FE) MG EC tablet Take 325 mg by mouth daily.   furosemide 20 MG tablet Commonly known as: LASIX Take 20 mg by mouth daily.   gabapentin 100 MG capsule Commonly known as: NEURONTIN Take 1 capsule (100 mg total) by mouth 3 (three) times daily. What changed: how much to take   magnesium oxide 400 (241.3 Mg) MG tablet Commonly known as: MAG-OX Take 1 tablet (400 mg total) by mouth 2 (two) times daily for 3 days.   oxycodone 30 MG immediate release tablet Commonly known as: ROXICODONE Take 0.5 tablets (15 mg total) by mouth 3 (three) times daily as needed for pain. What changed:   how much to take  Another medication with the same name was removed. Continue taking this medication, and follow the directions you see here.   pantoprazole 40 MG tablet Commonly known as: PROTONIX Take 1 tablet (40 mg total) by mouth daily.   polyethylene glycol 17 g packet Commonly known as: MIRALAX / GLYCOLAX Take 17 g by mouth daily. What changed:   when to take this  reasons to take this       Allergies  Allergen Reactions  . Hctz [Hydrochlorothiazide]     Significant and rapid hyponatremia (TIH)  . Amoxicillin-Pot Clavulanate Hives and Diarrhea    Has patient had a PCN reaction causing immediate rash, facial/tongue/throat swelling, SOB or lightheadedness with hypotension: yes Has patient had a PCN reaction causing severe rash involving mucus membranes or skin necrosis: yes Has patient  had a PCN reaction that required hospitalization : unknown Has patient had a PCN reaction occurring within the last 10 years: unknown If all of the above answers are "NO", then may proceed with Cephalosporin use.   Marland Kitchen Lisinopril-Hydrochlorothiazide Other (See Comments)    Tired- no energy to do anything..  . Sulfa Antibiotics Hives and Nausea And Vomiting  . Sulfonamide Derivatives Hives    Time coordinating discharge: Over 30 minutes   SIGNED:   Harold Hedge, D.O. [facility] Pager: [phone]  02/08/2020, 10:34 AM

## 2020-02-08 NOTE — TOC Transition Note (Signed)
Transition of Care [facility]) - CM/SW Discharge Note   Patient Details  Name: Angelica Bell MRN: [id number] Date of Birth: Oct 14, 1932  [facility] CM/SW Contact:  Lynnell Catalan, RN Phone Number: 02/08/2020, 10:45 AM   Clinical Narrative:    Bed offers provided to pt and [facility] chosen. Bed at [facility] available today. Pt to be sent to [facility] via [facility]. Yellow DNR and medical necessity on shadow chart. Pt going to room 102a. RN to call report to [phone].

## 2020-02-10 LAB — CULTURE, BLOOD (ROUTINE X 2)
Culture: NO GROWTH
Culture: NO GROWTH
Special Requests: ADEQUATE

## 2020-02-28 DIAGNOSIS — S2249XA Multiple fractures of ribs, unspecified side, initial encounter for closed fracture: Secondary | ICD-10-CM

## 2020-02-28 HISTORY — DX: Multiple fractures of ribs, unspecified side, initial encounter for closed fracture: S22.49XA

## 2020-03-08 ENCOUNTER — Emergency Department (HOSPITAL_COMMUNITY): Payer: Medicare Other

## 2020-03-08 ENCOUNTER — Other Ambulatory Visit: Payer: Self-pay

## 2020-03-08 ENCOUNTER — Encounter (HOSPITAL_COMMUNITY): Payer: Self-pay

## 2020-03-08 ENCOUNTER — Observation Stay (HOSPITAL_COMMUNITY): Payer: Medicare Other

## 2020-03-08 ENCOUNTER — Observation Stay (HOSPITAL_COMMUNITY)
Admission: EM | Admit: 2020-03-08 | Discharge: 2020-03-12 | Disposition: A | Discharge status: Home / Self Care | Payer: Medicare Other | Attending: Internal Medicine | Admitting: Internal Medicine

## 2020-03-08 DIAGNOSIS — N179 Acute kidney failure, unspecified: Secondary | ICD-10-CM | POA: Diagnosis not present

## 2020-03-08 DIAGNOSIS — K449 Diaphragmatic hernia without obstruction or gangrene: Secondary | ICD-10-CM | POA: Insufficient documentation

## 2020-03-08 DIAGNOSIS — K838 Other specified diseases of biliary tract: Secondary | ICD-10-CM | POA: Diagnosis present

## 2020-03-08 DIAGNOSIS — D72829 Elevated white blood cell count, unspecified: Secondary | ICD-10-CM | POA: Diagnosis present

## 2020-03-08 DIAGNOSIS — Z87891 Personal history of nicotine dependence: Secondary | ICD-10-CM | POA: Insufficient documentation

## 2020-03-08 DIAGNOSIS — G629 Polyneuropathy, unspecified: Secondary | ICD-10-CM | POA: Diagnosis not present

## 2020-03-08 DIAGNOSIS — F419 Anxiety disorder, unspecified: Secondary | ICD-10-CM | POA: Insufficient documentation

## 2020-03-08 DIAGNOSIS — E785 Hyperlipidemia, unspecified: Secondary | ICD-10-CM | POA: Diagnosis present

## 2020-03-08 DIAGNOSIS — Z86711 Personal history of pulmonary embolism: Secondary | ICD-10-CM | POA: Diagnosis not present

## 2020-03-08 DIAGNOSIS — Z66 Do not resuscitate: Secondary | ICD-10-CM | POA: Diagnosis present

## 2020-03-08 DIAGNOSIS — Z79899 Other long term (current) drug therapy: Secondary | ICD-10-CM

## 2020-03-08 DIAGNOSIS — W06XXXA Fall from bed, initial encounter: Secondary | ICD-10-CM | POA: Diagnosis not present

## 2020-03-08 DIAGNOSIS — S2241XA Multiple fractures of ribs, right side, initial encounter for closed fracture: Principal | ICD-10-CM

## 2020-03-08 DIAGNOSIS — Z90710 Acquired absence of both cervix and uterus: Secondary | ICD-10-CM | POA: Insufficient documentation

## 2020-03-08 DIAGNOSIS — F341 Dysthymic disorder: Secondary | ICD-10-CM | POA: Diagnosis present

## 2020-03-08 DIAGNOSIS — Z79891 Long term (current) use of opiate analgesic: Secondary | ICD-10-CM | POA: Diagnosis not present

## 2020-03-08 DIAGNOSIS — S2241XB Multiple fractures of ribs, right side, initial encounter for open fracture: Secondary | ICD-10-CM

## 2020-03-08 DIAGNOSIS — I700 Atherosclerosis of aorta: Secondary | ICD-10-CM | POA: Insufficient documentation

## 2020-03-08 DIAGNOSIS — S2249XA Multiple fractures of ribs, unspecified side, initial encounter for closed fracture: Secondary | ICD-10-CM | POA: Diagnosis present

## 2020-03-08 DIAGNOSIS — K5900 Constipation, unspecified: Secondary | ICD-10-CM | POA: Diagnosis not present

## 2020-03-08 DIAGNOSIS — I351 Nonrheumatic aortic (valve) insufficiency: Secondary | ICD-10-CM | POA: Diagnosis not present

## 2020-03-08 DIAGNOSIS — F329 Major depressive disorder, single episode, unspecified: Secondary | ICD-10-CM | POA: Insufficient documentation

## 2020-03-08 DIAGNOSIS — Z880 Allergy status to penicillin: Secondary | ICD-10-CM | POA: Insufficient documentation

## 2020-03-08 DIAGNOSIS — Z7982 Long term (current) use of aspirin: Secondary | ICD-10-CM | POA: Diagnosis not present

## 2020-03-08 DIAGNOSIS — Z20822 Contact with and (suspected) exposure to covid-19: Secondary | ICD-10-CM | POA: Insufficient documentation

## 2020-03-08 DIAGNOSIS — D649 Anemia, unspecified: Secondary | ICD-10-CM | POA: Diagnosis not present

## 2020-03-08 DIAGNOSIS — M50321 Other cervical disc degeneration at C4-C5 level: Secondary | ICD-10-CM | POA: Insufficient documentation

## 2020-03-08 DIAGNOSIS — M419 Scoliosis, unspecified: Secondary | ICD-10-CM | POA: Insufficient documentation

## 2020-03-08 DIAGNOSIS — G8929 Other chronic pain: Secondary | ICD-10-CM | POA: Diagnosis not present

## 2020-03-08 DIAGNOSIS — R296 Repeated falls: Secondary | ICD-10-CM | POA: Diagnosis not present

## 2020-03-08 DIAGNOSIS — I10 Essential (primary) hypertension: Secondary | ICD-10-CM | POA: Diagnosis not present

## 2020-03-08 DIAGNOSIS — Z882 Allergy status to sulfonamides status: Secondary | ICD-10-CM | POA: Insufficient documentation

## 2020-03-08 HISTORY — DX: Unspecified hearing loss, unspecified ear: H91.90

## 2020-03-08 HISTORY — DX: Rhabdomyolysis: M62.82

## 2020-03-08 HISTORY — DX: Repeated falls: R29.6

## 2020-03-08 LAB — COMPREHENSIVE METABOLIC PANEL
ALT: 19 U/L (ref 0–44)
AST: 32 U/L (ref 15–41)
Albumin: 3.7 g/dL (ref 3.5–5.0)
Alkaline Phosphatase: 86 U/L (ref 38–126)
Anion gap: 8 (ref 5–15)
BUN: 26 mg/dL — ABNORMAL HIGH (ref 8–23)
CO2: 30 mmol/L (ref 22–32)
Calcium: 9 mg/dL (ref 8.9–10.3)
Chloride: 99 mmol/L (ref 98–111)
Creatinine, Ser: 0.84 mg/dL (ref 0.44–1.00)
GFR calc Af Amer: >60 mL/min (ref >60)
GFR calc non Af Amer: >60 mL/min (ref >60)
Glucose, Bld: 128 mg/dL — ABNORMAL HIGH (ref 70–99)
Potassium: 3.9 mmol/L (ref 3.5–5.1)
Sodium: 137 mmol/L (ref 135–145)
Total Bilirubin: 0.4 mg/dL (ref 0.3–1.2)
Total Protein: 6.4 g/dL — ABNORMAL LOW (ref 6.5–8.1)

## 2020-03-08 LAB — CK: Total CK: 197 U/L (ref 38–234)

## 2020-03-08 LAB — CBC
HCT: 38.2 % (ref 36.0–46.0)
Hemoglobin: 11.5 g/dL — ABNORMAL LOW (ref 12.0–15.0)
MCH: 27.3 pg (ref 26.0–34.0)
MCHC: 30.1 g/dL (ref 30.0–36.0)
MCV: 90.5 fL (ref 80.0–100.0)
Platelets: 309 10*3/uL (ref 150–400)
RBC: 4.22 MIL/uL (ref 3.87–5.11)
RDW: 13.7 % (ref 11.5–15.5)
WBC: 14.4 10*3/uL — ABNORMAL HIGH (ref 4.0–10.5)
nRBC: 0 % (ref 0.0–0.2)

## 2020-03-08 LAB — SARS CORONAVIRUS 2 (TAT 6-24 HRS): SARS Coronavirus 2: NEGATIVE

## 2020-03-08 LAB — PHOSPHORUS: Phosphorus: 4.3 mg/dL (ref 2.5–4.6)

## 2020-03-08 LAB — MAGNESIUM: Magnesium: 2 mg/dL (ref 1.7–2.4)

## 2020-03-08 MED ORDER — ASPIRIN EC 81 MG PO TBEC
81.0000 mg | DELAYED_RELEASE_TABLET | Freq: Every day | ORAL | Status: DC
  Administered 2020-03-08 – 2020-03-12 (×5): 81 mg via ORAL
  Filled 2020-03-08 (×6): qty 1

## 2020-03-08 MED ORDER — ONDANSETRON HCL 4 MG PO TABS: 4.0000 mg | ORAL_TABLET | Freq: Four times a day (QID) | ORAL | Status: DC | PRN

## 2020-03-08 MED ORDER — PANTOPRAZOLE SODIUM 40 MG PO TBEC
40.0000 mg | DELAYED_RELEASE_TABLET | Freq: Every day | ORAL | Status: DC
  Administered 2020-03-08 – 2020-03-12 (×5): 40 mg via ORAL
  Filled 2020-03-08 (×5): qty 1

## 2020-03-08 MED ORDER — FENTANYL CITRATE (PF) 100 MCG/2ML IJ SOLN: 100.0000 ug | Freq: Once | INTRAMUSCULAR | Status: DC

## 2020-03-08 MED ORDER — GABAPENTIN 100 MG PO CAPS
100.0000 mg | ORAL_CAPSULE | Freq: Three times a day (TID) | ORAL | Status: DC
  Administered 2020-03-08 – 2020-03-12 (×12): 100 mg via ORAL
  Filled 2020-03-08 (×12): qty 1

## 2020-03-08 MED ORDER — POLYETHYLENE GLYCOL 3350 17 G PO PACK: 17.0000 g | PACK | Freq: Every day | ORAL | Status: DC

## 2020-03-08 MED ORDER — FERROUS SULFATE 325 (65 FE) MG PO TABS
325.0000 mg | ORAL_TABLET | Freq: Every day | ORAL | Status: DC
  Administered 2020-03-08 – 2020-03-12 (×5): 325 mg via ORAL
  Filled 2020-03-08 (×5): qty 1

## 2020-03-08 MED ORDER — FENTANYL CITRATE (PF) 100 MCG/2ML IJ SOLN
100.0000 ug | Freq: Once | INTRAMUSCULAR | Status: AC
  Administered 2020-03-08: 100 ug via INTRAVENOUS
  Filled 2020-03-08: qty 2

## 2020-03-08 MED ORDER — SODIUM CHLORIDE (PF) 0.9 % IJ SOLN
INTRAMUSCULAR | Status: AC
  Filled 2020-03-08: qty 50

## 2020-03-08 MED ORDER — MIRABEGRON ER 25 MG PO TB24
25.0000 mg | ORAL_TABLET | Freq: Every day | ORAL | Status: DC
  Administered 2020-03-08 – 2020-03-12 (×5): 25 mg via ORAL
  Filled 2020-03-08 (×5): qty 1

## 2020-03-08 MED ORDER — IOHEXOL 300 MG/ML  SOLN
75.0000 mL | Freq: Once | INTRAMUSCULAR | Status: AC | PRN
  Administered 2020-03-08: 75 mL via INTRAVENOUS

## 2020-03-08 MED ORDER — ONDANSETRON HCL 4 MG/2ML IJ SOLN: 4.0000 mg | Freq: Four times a day (QID) | INTRAMUSCULAR | Status: DC | PRN

## 2020-03-08 MED ORDER — KETOROLAC TROMETHAMINE 30 MG/ML IJ SOLN
15.0000 mg | Freq: Three times a day (TID) | INTRAMUSCULAR | Status: AC
  Administered 2020-03-08 – 2020-03-09 (×2): 15 mg via INTRAVENOUS
  Filled 2020-03-08 (×2): qty 1

## 2020-03-08 MED ORDER — SODIUM CHLORIDE 0.9 % IV SOLN: INTRAVENOUS | Status: DC

## 2020-03-08 MED ORDER — ESCITALOPRAM OXALATE 20 MG PO TABS
20.0000 mg | ORAL_TABLET | Freq: Every day | ORAL | Status: DC
  Administered 2020-03-08 – 2020-03-12 (×5): 20 mg via ORAL
  Filled 2020-03-08 (×5): qty 1

## 2020-03-08 MED ORDER — KETOROLAC TROMETHAMINE 15 MG/ML IJ SOLN
15.0000 mg | Freq: Once | INTRAMUSCULAR | Status: AC
  Administered 2020-03-08: 15 mg via INTRAVENOUS
  Filled 2020-03-08: qty 1

## 2020-03-08 MED ORDER — OXYCODONE-ACETAMINOPHEN 7.5-325 MG PO TABS
1.0000 | ORAL_TABLET | Freq: Four times a day (QID) | ORAL | Status: DC | PRN
  Administered 2020-03-08 – 2020-03-09 (×2): 1 via ORAL
  Filled 2020-03-08 (×2): qty 1

## 2020-03-08 NOTE — ED Notes (Signed)
Pt transported to xray 

## 2020-03-08 NOTE — H&P (Deleted)
[facility] Surgery Consult Note   Angelica Bell 1932-11-21  YE:[id number].     Requesting MD: Reubin Milan Chief Complaint:  Fall at home Reason for Consult: fall with fracture right 4th, 5th, and 6th ribs   HPI: Patient is an 86 year old female transported by EMS after reporting she fell out of bed around 1 AM this morning.  She was not sure what happened says she was suddenly on the floor.  Her grandson found her on the floor again this AM. She reported severe pain in her ribs on the right.  It hurts to take a breath.  She did drop her oxygen saturation down to 89% on room air secondary to pain.  She was placed on nasal cannula O2 in the ED.  She was recently hospitalized 2/6-02/08/2020, she also fell out of bed at that time.  She also had issues with urinary retention during that hospitalization.  She has a history of multiple falls at home; she thinks 2-3 so far this year.  She has acute on chronic back pain on chronic narcotics and Neurontin.  She was ultimately discharged to skilled nursing facility, and says she was there for a couple weeks and is now back at home.  Her grandson comes in each morning and PM.  He brings her meals and takes her shopping.   She has a history of hypertension, chronic back pain, history of PE in 1967.   Patient was brought to the emergency department at [facility]. Work-up in the ED shows she is afebrile vital signs are stable.  She did desaturate on room air was placed on a 2 L nasal cannula.  CMP is normal except for glucose of 128, BUN of 26, creatinine 0.84, total protein 6.4.  CK is 197.  WBC 14.4, hemoglobin 11.5, hematocrit 38.2, platelets 309,000. Chest x-ray shows an acute right lateral fourth and fifth rib fractures no associated pneumothorax or effusion.  CT of the head with contrast shows mild diffuse cortical atrophy, mild chronic ischemic white matter disease no acute intracranial abnormality.  There is severe multilevel  degenerative disc disease no acute abnormality seen in the cervical spine. Chest CT and CT of the abdomen the pelvis shows right fourth, fifth, and sixth lateral rib fractures.  No pneumothorax or pulmonary contusions.  There were no acute abdominal pelvic findings, mass lesions or adenopathy.  Intra and extrahepatic biliary dilatation new since his CT scan of September 2020.  No obvious obstructing common bile duct stones ampullary lesions or pancreatic head mass.  Advanced atherosclerotic calcification involving the thoracic and abdominal aorta and branching vessels including the coronary arteries.  Trauma service is ask to consult. Medicine is admitting, she will need to be transferred to [facility] for Trauma to follow.       ROS: Review of Systems  Constitutional: Positive for weight loss (she isn't sure how much or time period ).  HENT: Negative.   Eyes: Negative.   Respiratory: Negative.   Cardiovascular: Negative.   Gastrointestinal: Negative.   Genitourinary:       Wears Depends garments 24/7  Musculoskeletal: Positive for back pain (Chronic back pain  PMP shows chronic use going back to 2019).       Complained of some left foot pain for several months.  Skin: Negative.   Neurological: Negative.   Endo/Heme/Allergies: Bruises/bleeds easily.  Psychiatric/Behavioral: Positive for memory loss.       On chronic pain  meds for back for some time back  to 2019           Family History  Problem Relation Age of Onset  . Heart Problems Mother            Past Medical History:  Diagnosis Date  . Chronic back pain      with degenerative joint disease  . Chronic pain disorder      with narcotic management  . Dry eyes    . Hypertension    . Osteoporosis    . PE (pulmonary embolism)      After hysterectomy in 1967  . Pneumonia             Past Surgical History:  Procedure Laterality Date  . ABDOMINAL HYSTERECTOMY      . APPENDECTOMY      . BREAST ENHANCEMENT SURGERY      .  CATARACT EXTRACTION      . FEMUR IM NAIL Left 02/17/2017    Procedure: INTRAMEDULLARY (IM) NAIL FEMORAL;  Surgeon: Nicholes Stairs, MD;  Location: [place] [facility];  Service: Orthopedics;  Laterality: Left;  . HIP ARTHROPLASTY Right 12/24/2015    Procedure: ARTHROPLASTY BIPOLAR HIP (HEMIARTHROPLASTY);  Surgeon: Netta Cedars, MD;  Location: [facility];  Service: Orthopedics;  Laterality: Right;  . TONSILLECTOMY          Social History:  reports that she quit smoking about 43 years ago. She quit after 13.00 years of use. She has never used smokeless tobacco. She reports that she does not drink alcohol or use drugs.   Allergies:  Allergies  Allergen Reactions  . Hctz [Hydrochlorothiazide]        Significant and rapid hyponatremia (TIH)  . Amoxicillin-Pot Clavulanate Hives and Diarrhea      Has patient had a PCN reaction causing immediate rash, facial/tongue/throat swelling, SOB or lightheadedness with hypotension: yes Has patient had a PCN reaction causing severe rash involving mucus membranes or skin necrosis: yes Has patient had a PCN reaction that required hospitalization : unknown Has patient had a PCN reaction occurring within the last 10 years: unknown If all of the above answers are "NO", then may proceed with Cephalosporin use.    Marland Kitchen Lisinopril-Hydrochlorothiazide Other (See Comments)      Tired- no energy to do anything..  . Sulfa Antibiotics Hives and Nausea And Vomiting  . Sulfonamide Derivatives Hives             Prior to Admission medications   Medication Sig Start Date End Date Taking? Authorizing Provider  amLODipine (NORVASC) 10 MG tablet Take 1 tablet (10 mg total) by mouth daily. 09/08/19 02/05/20   Kayleen Memos, DO  aspirin EC 81 MG EC tablet Take 1 tablet (81 mg total) by mouth daily. 09/15/19     Matcha, Beverely Pace, MD  atorvastatin (LIPITOR) 10 MG tablet Take 1 tablet (10 mg total) by mouth daily at 6 PM. Patient not taking: Reported on 02/05/2020 08/07/19     Mariel Aloe, MD    escitalopram (LEXAPRO) 10 MG tablet Take 10 mg by mouth daily. 01/10/20     [provider]  escitalopram (LEXAPRO) 20 MG tablet Take 20 mg by mouth daily. 02/23/20     [provider]  ferrous sulfate 325 (65 FE) MG EC tablet Take 325 mg by mouth daily. 01/12/20     [provider]  furosemide (LASIX) 20 MG tablet Take 20 mg by mouth daily. 01/10/20     [provider]  gabapentin (NEURONTIN) 100 MG capsule  Take 1 capsule (100 mg total) by mouth 3 (three) times daily. 02/08/20     Harold Hedge, MD  MYRBETRIQ 25 MG TB24 tablet Take 25 mg by mouth daily. 02/23/20     [provider]  oxycodone (ROXICODONE) 30 MG immediate release tablet Take 0.5 tablets (15 mg total) by mouth 3 (three) times daily as needed for pain. 02/08/20     Harold Hedge, MD  pantoprazole (PROTONIX) 40 MG tablet Take 1 tablet (40 mg total) by mouth daily. Patient not taking: Reported on 02/05/2020 09/08/19     Kayleen Memos, DO  polyethylene glycol (MIRALAX / GLYCOLAX) 17 g packet Take 17 g by mouth daily. Patient taking differently: Take 17 g by mouth daily as needed for mild constipation or moderate constipation.  09/08/19     Kayleen Memos, DO        Blood pressure (!) 111/48, pulse 64, temperature 97.9 F (36.6 C), temperature source Oral, resp. rate 12, weight 47.6 kg, SpO2 99 %. Physical Exam:  General: pleasant, elderly, frail 86 y/o woman on gurney with ongoing pain right lateral chest, and back HEENT: head is normocephalic, atraumatic.  Sclera are noninjected.  Pupils are equal.    Mouth is pink and moist Heart: regular, rate, and rhythm.  Normal s1,s2. No obvious murmurs, gallops, or rubs noted.  Palpable radial and pedal pulses bilaterally.distal pulses are present but diminished. Lungs: CTAB, no wheezes, rhonchi.  Bilateral rales noted both bases.  Respiratory effort is painful, and she has pain lying still or moving.  She does move fairly well, breathing is nonlabored. Abd:  soft, NT, ND, +BS, no masses, hernias, or organomegaly MS: all 4 extremities are symmetrical with no cyanosis, clubbing, or edema. Skin: warm and dry with no masses, lesions, or rashes Neuro: Cranial nerves 2-12 grossly intact, sensation is normal throughout Psych: A&O, she isn't sure of the date, got the year right, it took awhile but she remembered Barbette Or was Software engineer.  Appropriate affect, somewhat decreased memory. She says she does her own pills, but was not really sure about the listed medicine with me.       Lab Results Last 48 Hours        Results for orders placed or performed during the hospital encounter of 03/08/20 (from the past 48 hour(s))  Comprehensive metabolic panel     Status: Abnormal    Collection Time: 03/08/20  8:16 AM  Result Value Ref Range    Sodium 137 135 - 145 mmol/L    Potassium 3.9 3.5 - 5.1 mmol/L    Chloride 99 98 - 111 mmol/L    CO2 30 22 - 32 mmol/L    Glucose, Bld 128 (H) 70 - 99 mg/dL      Comment: Glucose reference range applies only to samples taken after fasting for at least 8 hours.    BUN 26 (H) 8 - 23 mg/dL    Creatinine, Ser 0.84 0.44 - 1.00 mg/dL    Calcium 9.0 8.9 - 10.3 mg/dL    Total Protein 6.4 (L) 6.5 - 8.1 g/dL    Albumin 3.7 3.5 - 5.0 g/dL    AST 32 15 - 41 U/L    ALT 19 0 - 44 U/L    Alkaline Phosphatase 86 38 - 126 U/L    Total Bilirubin 0.4 0.3 - 1.2 mg/dL    GFR calc non Af Amer >60 >60 mL/min    GFR calc Af Amer >60 >60 mL/min  Anion gap 8 5 - 15      Comment: Performed at [facility], [place] [address], [place], [place] [address]  CBC     Status: Abnormal    Collection Time: 03/08/20  8:16 AM  Result Value Ref Range    WBC 14.4 (H) 4.0 - 10.5 K/uL    RBC 4.22 3.87 - 5.11 MIL/uL    Hemoglobin 11.5 (L) 12.0 - 15.0 g/dL    HCT 38.2 36.0 - 46.0 %    MCV 90.5 80.0 - 100.0 fL    MCH 27.3 26.0 - 34.0 pg    MCHC 30.1 30.0 - 36.0 g/dL    RDW 13.7 11.5 - 15.5 %    Platelets 309 150 - 400 K/uL    nRBC 0.0 0.0  - 0.2 %      Comment: Performed at [facility], [place] [address], [place], [place] [address]  CK     Status: None    Collection Time: 03/08/20  8:16 AM  Result Value Ref Range    Total CK 197 38 - 234 U/L      Comment: Performed at [facility], [place] [address] [place], [place] [address]       Imaging Results (Last 48 hours)  DG Chest 2 View   Result Date: 03/08/2020 CLINICAL DATA:  Right rib pain, fall EXAM: CHEST - 2 VIEW COMPARISON:  02/05/2020 FINDINGS: Hiatal hernia, stable. Low lung volumes. Stable bibasilar opacities could reflect scarring or atelectasis. Stable bronchitic changes. Heart is normal size. Old healed left humeral neck fracture. Fractures through the lateral right 4th and 5th ribs, new since prior study. No effusion or pneumothorax. IMPRESSION: Acute right lateral 4th and 5th rib fractures. No associated effusion or pneumothorax. Low lung volumes.  Bibasilar scarring or atelectasis. Stable large hiatal hernia. Electronically Signed   By: Rolm Baptise M.D.   On: 03/08/2020 09:06    CT Head Wo Contrast   Result Date: 03/08/2020 CLINICAL DATA:  Unwitnessed fall. No loss of consciousness. EXAM: CT HEAD WITHOUT CONTRAST CT CERVICAL SPINE WITHOUT CONTRAST TECHNIQUE: Multidetector CT imaging of the head and cervical spine was performed following the standard protocol without intravenous contrast. Multiplanar CT image reconstructions of the cervical spine were also generated. COMPARISON:  February 05, 2020. FINDINGS: CT HEAD FINDINGS Brain: Mild diffuse cortical atrophy is noted. Mild chronic ischemic white matter disease is noted. Old right cerebellar infarction is noted. No mass effect or midline shift is noted. Ventricular size is within normal limits. There is no evidence of mass lesion, hemorrhage or acute infarction. Vascular: No hyperdense vessel or unexpected calcification. Skull: Normal. Negative for fracture or focal lesion. Sinuses/Orbits:  No acute finding. Other: None. CT CERVICAL SPINE FINDINGS Alignment: Normal. Skull base and vertebrae: No acute fracture. No primary bone lesion or focal pathologic process. Soft tissues and spinal canal: No prevertebral fluid or swelling. No visible canal hematoma. Disc levels: Severe degenerative disc disease is noted at C4-5, C5-6 and C6-7 with anterior osteophyte formation. Upper chest: Negative. Other: None. IMPRESSION: 1. Mild diffuse cortical atrophy. Mild chronic ischemic white matter disease. No acute intracranial abnormality seen. 2. Severe multilevel degenerative disc disease. No acute abnormality seen in the cervical spine. Electronically Signed   By: Marijo Conception M.D.   On: 03/08/2020 11:47    CT Chest W Contrast   Result Date: 03/08/2020 CLINICAL DATA:  [place] out of bed this morning. Patient complaining of right chest pain. EXAM:  CT CHEST, ABDOMEN, AND PELVIS WITH CONTRAST TECHNIQUE: Multidetector CT imaging of the chest, abdomen and pelvis was performed following the standard protocol during bolus administration of intravenous contrast. CONTRAST:  55mL OMNIPAQUE IOHEXOL 300 MG/ML  SOLN COMPARISON:  08/31/2019 FINDINGS: CT CHEST FINDINGS Cardiovascular: The heart is normal in size. No pericardial effusion. There is mild tortuosity, ectasia and calcification of the thoracic aorta but no aneurysm or dissection. Three-vessel coronary artery calcifications are noted. Mediastinum/Nodes: No mediastinal or hilar mass or adenopathy or hematoma. The esophagus is grossly normal. Small scattered thyroid nodules are stable. Lungs/Pleura: No acute pulmonary findings. No pulmonary contusion, pneumothorax or pleural effusion. There is streaky bibasilar atelectasis and focal eventration of the right hemidiaphragm. No worrisome pulmonary lesions. The central tracheobronchial tree is unremarkable. Musculoskeletal: There are fourth, fifth and right sixth lateral rib fractures. These are minimally displaced. No  definite left-sided rib fractures. The sternum is intact. Scoliosis and degenerative changes involving the thoracic spine but no acute fracture. Extensive soft tissue density involving both breasts. This is unchanged since the prior CT scan and I suspected some type of remote silicone injections. CT ABDOMEN PELVIS FINDINGS Hepatobiliary: No focal hepatic lesions or evidence of acute hepatic injury. No perihepatic fluid collections. Gallbladder is grossly normal. There is intra and extrahepatic biliary dilatation. This is new since the CT scan from September 2020. The common bile duct measures a maximum of 13.5 mm in the head of the pancreas. I do not see an obvious common bile duct stone, ampullary region or pancreatic head mass to account for the dilatation. There could be a distal stricture. Recommend correlation with liver function studies. Patient probably not a good candidate for MRCP given limited breath holding for the CT scan. Pancreas: No mass, inflammation or ductal dilatation. Moderate pancreatic atrophy. Spleen: Normal size. No focal lesions. Adrenals/Urinary Tract: The adrenal glands are unremarkable. The left kidney is small and demonstrates renal cortical thinning. No renal lesions or hydronephrosis. The bladder is unremarkable. Stomach/Bowel: The stomach, duodenum, small bowel and colon are grossly normal without oral contrast. No acute inflammatory changes, mass lesions or obstructive findings. Vascular/Lymphatic: Advanced atherosclerotic calcifications involving the tortuous abdominal aorta but no aneurysm. The branch vessels are patent. The major venous structures are patent. No mesenteric or retroperitoneal mass or adenopathy. Reproductive: Surgically absent. Other: No pelvic mass or free pelvic fluid collections. Moderate artifact through the lower pelvis due to bilateral hip hardware. Musculoskeletal: Severe scoliosis and associated degenerative changes. No acute bony findings or destructive  bony changes. IMPRESSION: 1. Right fourth, fifth and sixth lateral rib fractures. No pneumothorax or pulmonary contusion. 2. No acute abdominal/pelvic findings, mass lesions or adenopathy. 3. Intra and extrahepatic biliary dilatation, new since CT scan of September 2020. No obvious obstructing common bile duct stone, ampullary lesion or pancreatic head mass. Recommend correlation with liver function studies. Patient probably not a good candidate for MRCP given limited breath holding for the CT scan. 4. Advanced atherosclerotic calcifications involving the thoracic and abdominal aorta and branch vessels including the coronary arteries. Aortic Atherosclerosis (ICD10-I70.0). Electronically Signed   By: Marijo Sanes M.D.   On: 03/08/2020 11:54    CT Cervical Spine Wo Contrast   Result Date: 03/08/2020 CLINICAL DATA:  Unwitnessed fall. No loss of consciousness. EXAM: CT HEAD WITHOUT CONTRAST CT CERVICAL SPINE WITHOUT CONTRAST TECHNIQUE: Multidetector CT imaging of the head and cervical spine was performed following the standard protocol without intravenous contrast. Multiplanar CT image reconstructions of the cervical spine were also generated.  COMPARISON:  February 05, 2020. FINDINGS: CT HEAD FINDINGS Brain: Mild diffuse cortical atrophy is noted. Mild chronic ischemic white matter disease is noted. Old right cerebellar infarction is noted. No mass effect or midline shift is noted. Ventricular size is within normal limits. There is no evidence of mass lesion, hemorrhage or acute infarction. Vascular: No hyperdense vessel or unexpected calcification. Skull: Normal. Negative for fracture or focal lesion. Sinuses/Orbits: No acute finding. Other: None. CT CERVICAL SPINE FINDINGS Alignment: Normal. Skull base and vertebrae: No acute fracture. No primary bone lesion or focal pathologic process. Soft tissues and spinal canal: No prevertebral fluid or swelling. No visible canal hematoma. Disc levels: Severe degenerative  disc disease is noted at C4-5, C5-6 and C6-7 with anterior osteophyte formation. Upper chest: Negative. Other: None. IMPRESSION: 1. Mild diffuse cortical atrophy. Mild chronic ischemic white matter disease. No acute intracranial abnormality seen. 2. Severe multilevel degenerative disc disease. No acute abnormality seen in the cervical spine. Electronically Signed   By: Marijo Conception M.D.   On: 03/08/2020 11:47    CT Abdomen Pelvis W Contrast   Result Date: 03/08/2020 CLINICAL DATA:  [place] out of bed this morning. Patient complaining of right chest pain. EXAM: CT CHEST, ABDOMEN, AND PELVIS WITH CONTRAST TECHNIQUE: Multidetector CT imaging of the chest, abdomen and pelvis was performed following the standard protocol during bolus administration of intravenous contrast. CONTRAST:  11mL OMNIPAQUE IOHEXOL 300 MG/ML  SOLN COMPARISON:  08/31/2019 FINDINGS: CT CHEST FINDINGS Cardiovascular: The heart is normal in size. No pericardial effusion. There is mild tortuosity, ectasia and calcification of the thoracic aorta but no aneurysm or dissection. Three-vessel coronary artery calcifications are noted. Mediastinum/Nodes: No mediastinal or hilar mass or adenopathy or hematoma. The esophagus is grossly normal. Small scattered thyroid nodules are stable. Lungs/Pleura: No acute pulmonary findings. No pulmonary contusion, pneumothorax or pleural effusion. There is streaky bibasilar atelectasis and focal eventration of the right hemidiaphragm. No worrisome pulmonary lesions. The central tracheobronchial tree is unremarkable. Musculoskeletal: There are fourth, fifth and right sixth lateral rib fractures. These are minimally displaced. No definite left-sided rib fractures. The sternum is intact. Scoliosis and degenerative changes involving the thoracic spine but no acute fracture. Extensive soft tissue density involving both breasts. This is unchanged since the prior CT scan and I suspected some type of remote silicone  injections. CT ABDOMEN PELVIS FINDINGS Hepatobiliary: No focal hepatic lesions or evidence of acute hepatic injury. No perihepatic fluid collections. Gallbladder is grossly normal. There is intra and extrahepatic biliary dilatation. This is new since the CT scan from September 2020. The common bile duct measures a maximum of 13.5 mm in the head of the pancreas. I do not see an obvious common bile duct stone, ampullary region or pancreatic head mass to account for the dilatation. There could be a distal stricture. Recommend correlation with liver function studies. Patient probably not a good candidate for MRCP given limited breath holding for the CT scan. Pancreas: No mass, inflammation or ductal dilatation. Moderate pancreatic atrophy. Spleen: Normal size. No focal lesions. Adrenals/Urinary Tract: The adrenal glands are unremarkable. The left kidney is small and demonstrates renal cortical thinning. No renal lesions or hydronephrosis. The bladder is unremarkable. Stomach/Bowel: The stomach, duodenum, small bowel and colon are grossly normal without oral contrast. No acute inflammatory changes, mass lesions or obstructive findings. Vascular/Lymphatic: Advanced atherosclerotic calcifications involving the tortuous abdominal aorta but no aneurysm. The branch vessels are patent. The major venous structures are patent. No mesenteric or retroperitoneal mass  or adenopathy. Reproductive: Surgically absent. Other: No pelvic mass or free pelvic fluid collections. Moderate artifact through the lower pelvis due to bilateral hip hardware. Musculoskeletal: Severe scoliosis and associated degenerative changes. No acute bony findings or destructive bony changes. IMPRESSION: 1. Right fourth, fifth and sixth lateral rib fractures. No pneumothorax or pulmonary contusion. 2. No acute abdominal/pelvic findings, mass lesions or adenopathy. 3. Intra and extrahepatic biliary dilatation, new since CT scan of September 2020. No obvious  obstructing common bile duct stone, ampullary lesion or pancreatic head mass. Recommend correlation with liver function studies. Patient probably not a good candidate for MRCP given limited breath holding for the CT scan. 4. Advanced atherosclerotic calcifications involving the thoracic and abdominal aorta and branch vessels including the coronary arteries. Aortic Atherosclerosis (ICD10-I70.0). Electronically Signed   By: Marijo Sanes M.D.   On: 03/08/2020 11:54           Assessment/Plan Recurrent falls from bed Fx 4th, 5th, and 6th right lateral ribs Chronic back pain - Neurontin & oxycodone Chronic narcotic use for pain control Hx rhabdomyolysis with AKI after fall 02/05/2020     Plan:  Pt is being admitted by Medicine and will transfer to [facility].  Trauma service will follow.  She will need pain control, IS, Flutter valve, PT/OT.  Will recheck CXR in AM.  I would place her back on her chronic medicines, add Toradol as long as her renal function remains stable. Continue IV narcotics PRN for pain not relieved by her chronic pain medicines.   She will need to be tested for COVID.  I will add Toradol for another 24 hours, but defer to Medicine for now so we don't have 2 services doing pain medicines.  Dr. Barry Dienes will see her when she gets to [facility].  If labs and CXR ok tomorrow she can start on DVT prophylaxis.     Earnstine Regal [facility] Surgery 03/08/2020, 1:14 PM Please see Amion for pager number during day hours 7:00am-4:30pm

## 2020-03-08 NOTE — ED Notes (Signed)
Report given to [facility]. Pt transported to [facility]

## 2020-03-08 NOTE — ED Provider Notes (Signed)
[facility] DEPT Provider Note   CSN: [id number] Arrival date & time: 03/08/20  [id number]     History Chief Complaint  Patient presents with  . Fall    Angelica Bell is a 86 y.o. female.  86yo F w/ PMH including chronic back pain on chronic opiates, distant h/o PE, HTN, frequent falls who p/w fall.  Patient states that around 1 AM she fell out of bed.  She is not sure what happened but she was all of a sudden on the floor.  She reports severe pain in her ribs especially on the right anterior ribs and it hurts to take a deep breath in.  She denies any abdominal pain, back pain, headache, extremity injury or weakness.  The history is provided by the patient.  Fall       Past Medical History:  Diagnosis Date  . Chronic back pain    with degenerative joint disease  . Chronic pain disorder    with narcotic management  . Dry eyes   . Hypertension   . Osteoporosis   . PE (pulmonary embolism)    After hysterectomy in 1967  . Pneumonia     Patient Active Problem List   Diagnosis Date Noted  . Hypomagnesemia 02/08/2020  . Fall 02/06/2020  . AKI (acute kidney injury) ([place]) 02/05/2020  . UTI (urinary tract infection) 09/01/2019  . Rhabdomyolysis 09/01/2019  . Shock ([place]) 08/31/2019  . Acute cystitis 08/03/2019  . Confusion and disorientation 08/03/2019  . Cerebrovascular accident (CVA) ([employer])   . ARF (acute renal failure) ([place]) 07/26/2019  . Hyponatremia 07/26/2019  . Abnormal liver function 07/26/2019  . Peripheral neuropathy   . Drug overdose   . Polypharmacy   . Altered mental status 10/25/2017  . Closed displaced intertrochanteric fracture of left femur ([place]) 02/16/2017  . Hip fracture, right ([place]) 12/25/2015  . Chronic pain 12/24/2015  . Fracture of femoral neck, right, closed ([place]) 12/24/2015  . Essential hypertension 12/24/2015  . Community acquired pneumonia 04/15/2013  . Hypokalemia 04/15/2013  . Labial cyst 02/08/2012  .  POSTMENOPAUSAL SYNDROME 11/28/2009  . PERIPHERAL NEUROPATHY, LOWER EXTREMITY, RIGHT 02/22/2009  . INSOMNIA-SLEEP DISORDER-UNSPEC 10/12/2008  . Lumbago 08/02/2008  . 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 ANXIETY DEPRESSION 12/16/2007  . HYPERLIPIDEMIA 07/08/2007  . Osteoarthritis 07/08/2007  . Osteoporosis 07/08/2007  . SCOLIOSIS NEC 07/08/2007    Past Surgical History:  Procedure Laterality Date  . ABDOMINAL HYSTERECTOMY    . APPENDECTOMY    . BREAST ENHANCEMENT SURGERY    . CATARACT EXTRACTION    . FEMUR IM NAIL Left 02/17/2017   Procedure: INTRAMEDULLARY (IM) NAIL FEMORAL;  Surgeon: Nicholes Stairs, MD;  Location: [facility];  Service: Orthopedics;  Laterality: Left;  . HIP ARTHROPLASTY Right 12/24/2015   Procedure: ARTHROPLASTY BIPOLAR HIP (HEMIARTHROPLASTY);  Surgeon: Netta Cedars, MD;  Location: [facility];  Service: Orthopedics;  Laterality: Right;  . TONSILLECTOMY       OB History   No obstetric history on file.     Family History  Problem Relation Age of Onset  . Heart Problems Mother     Social History   Tobacco Use  . Smoking status: Former Smoker    Years: 13.00    Quit date: 04/15/1976    Years since quitting: 43.9  . Smokeless tobacco: Never Used  Substance Use Topics  . Alcohol use: No  . Drug use: No    Home Medications Prior to Admission medications   Medication  Sig Start Date End Date Taking? Authorizing Provider  amLODipine (NORVASC) 10 MG tablet Take 1 tablet (10 mg total) by mouth daily. 09/08/19 02/05/20  Kayleen Memos, DO  aspirin EC 81 MG EC tablet Take 1 tablet (81 mg total) by mouth daily. 09/15/19   Matcha, Beverely Pace, MD  atorvastatin (LIPITOR) 10 MG tablet Take 1 tablet (10 mg total) by mouth daily at 6 PM. Patient not taking: Reported on 02/05/2020 08/07/19   Mariel Aloe, MD  escitalopram (LEXAPRO) 10 MG tablet Take 10 mg by mouth daily. 01/10/20   [provider]  ferrous sulfate 325 (65 FE) MG EC tablet Take 325 mg by  mouth daily. 01/12/20   [provider]  furosemide (LASIX) 20 MG tablet Take 20 mg by mouth daily. 01/10/20   [provider]  gabapentin (NEURONTIN) 100 MG capsule Take 1 capsule (100 mg total) by mouth 3 (three) times daily. 02/08/20   Harold Hedge, MD  MYRBETRIQ 25 MG TB24 tablet Take 25 mg by mouth daily. 02/23/20   [provider]  oxycodone (ROXICODONE) 30 MG immediate release tablet Take 0.5 tablets (15 mg total) by mouth 3 (three) times daily as needed for pain. 02/08/20   Harold Hedge, MD  pantoprazole (PROTONIX) 40 MG tablet Take 1 tablet (40 mg total) by mouth daily. Patient not taking: Reported on 02/05/2020 09/08/19   Kayleen Memos, DO  polyethylene glycol (MIRALAX / GLYCOLAX) 17 g packet Take 17 g by mouth daily. Patient taking differently: Take 17 g by mouth daily as needed for mild constipation or moderate constipation.  09/08/19   Kayleen Memos, DO    Allergies    Hctz [hydrochlorothiazide], Amoxicillin-pot clavulanate, Lisinopril-hydrochlorothiazide, Sulfa antibiotics, and Sulfonamide derivatives  Review of Systems   Review of Systems All other systems reviewed and are negative except that which was mentioned in HPI  Physical Exam Updated Vital Signs BP (!) 152/67   Pulse 74   Temp 97.9 F (36.6 C) (Oral)   Resp (!) 22   Wt 47.6 kg   SpO2 91%   BMI 20.51 kg/m   Physical Exam Vitals and nursing note reviewed.  Constitutional:      General: She is not in acute distress.    Appearance: She is well-developed.  HENT:     Head: Normocephalic and atraumatic.     Mouth/Throat:     Mouth: Mucous membranes are dry.  Eyes:     Conjunctiva/sclera: Conjunctivae normal.     Pupils: Pupils are equal, round, and reactive to light.  Cardiovascular:     Rate and Rhythm: Normal rate and regular rhythm.     Heart sounds: Normal heart sounds. No murmur.  Pulmonary:     Effort: Pulmonary effort is normal. No respiratory distress.     Breath sounds:  Normal breath sounds.     Comments: Tenderness b/l chest wall especially over anterior R chest where faint ecchymosis is forming; shallow respirations 2/2 pain Chest:     Chest wall: Tenderness present.  Abdominal:     General: Bowel sounds are normal. There is no distension.     Palpations: Abdomen is soft.     Tenderness: There is no abdominal tenderness.  Musculoskeletal:        General: No swelling or tenderness. Normal range of motion.     Cervical back: Neck supple.  Skin:    General: Skin is warm and dry.  Neurological:     Mental Status: She is  alert and oriented to person, place, and time.     Comments: Fluent speech  Psychiatric:        Judgment: Judgment normal.     ED Results / Procedures / Treatments   Labs (all labs ordered are listed, but only abnormal results are displayed) Labs Reviewed  COMPREHENSIVE METABOLIC PANEL - Abnormal; Notable for the following components:      Result Value   Glucose, Bld 128 (*)    BUN 26 (*)    Total Protein 6.4 (*)    All other components within normal limits  CBC - Abnormal; Notable for the following components:   WBC 14.4 (*)    Hemoglobin 11.5 (*)    All other components within normal limits  SARS CORONAVIRUS 2 (TAT 6-24 HRS)  CK  MAGNESIUM  PHOSPHORUS  URINALYSIS, ROUTINE W REFLEX MICROSCOPIC    EKG None  Radiology DG Chest 2 View  Result Date: 03/08/2020 CLINICAL DATA:  Right rib pain, fall EXAM: CHEST - 2 VIEW COMPARISON:  02/05/2020 FINDINGS: Hiatal hernia, stable. Low lung volumes. Stable bibasilar opacities could reflect scarring or atelectasis. Stable bronchitic changes. Heart is normal size. Old healed left humeral neck fracture. Fractures through the lateral right 4th and 5th ribs, new since prior study. No effusion or pneumothorax. IMPRESSION: Acute right lateral 4th and 5th rib fractures. No associated effusion or pneumothorax. Low lung volumes.  Bibasilar scarring or atelectasis. Stable large hiatal hernia.  Electronically Signed   By: Rolm Baptise M.D.   On: 03/08/2020 09:06   CT Head Wo Contrast  Result Date: 03/08/2020 CLINICAL DATA:  Unwitnessed fall. No loss of consciousness. EXAM: CT HEAD WITHOUT CONTRAST CT CERVICAL SPINE WITHOUT CONTRAST TECHNIQUE: Multidetector CT imaging of the head and cervical spine was performed following the standard protocol without intravenous contrast. Multiplanar CT image reconstructions of the cervical spine were also generated. COMPARISON:  February 05, 2020. FINDINGS: CT HEAD FINDINGS Brain: Mild diffuse cortical atrophy is noted. Mild chronic ischemic white matter disease is noted. Old right cerebellar infarction is noted. No mass effect or midline shift is noted. Ventricular size is within normal limits. There is no evidence of mass lesion, hemorrhage or acute infarction. Vascular: No hyperdense vessel or unexpected calcification. Skull: Normal. Negative for fracture or focal lesion. Sinuses/Orbits: No acute finding. Other: None. CT CERVICAL SPINE FINDINGS Alignment: Normal. Skull base and vertebrae: No acute fracture. No primary bone lesion or focal pathologic process. Soft tissues and spinal canal: No prevertebral fluid or swelling. No visible canal hematoma. Disc levels: Severe degenerative disc disease is noted at C4-5, C5-6 and C6-7 with anterior osteophyte formation. Upper chest: Negative. Other: None. IMPRESSION: 1. Mild diffuse cortical atrophy. Mild chronic ischemic white matter disease. No acute intracranial abnormality seen. 2. Severe multilevel degenerative disc disease. No acute abnormality seen in the cervical spine. Electronically Signed   By: Marijo Conception M.D.   On: 03/08/2020 11:47   CT Chest W Contrast  Result Date: 03/08/2020 CLINICAL DATA:  [place] out of bed this morning. Patient complaining of right chest pain. EXAM: CT CHEST, ABDOMEN, AND PELVIS WITH CONTRAST TECHNIQUE: Multidetector CT imaging of the chest, abdomen and pelvis was performed following  the standard protocol during bolus administration of intravenous contrast. CONTRAST:  5mL OMNIPAQUE IOHEXOL 300 MG/ML  SOLN COMPARISON:  08/31/2019 FINDINGS: CT CHEST FINDINGS Cardiovascular: The heart is normal in size. No pericardial effusion. There is mild tortuosity, ectasia and calcification of the thoracic aorta but no aneurysm or dissection. Three-vessel  coronary artery calcifications are noted. Mediastinum/Nodes: No mediastinal or hilar mass or adenopathy or hematoma. The esophagus is grossly normal. Small scattered thyroid nodules are stable. Lungs/Pleura: No acute pulmonary findings. No pulmonary contusion, pneumothorax or pleural effusion. There is streaky bibasilar atelectasis and focal eventration of the right hemidiaphragm. No worrisome pulmonary lesions. The central tracheobronchial tree is unremarkable. Musculoskeletal: There are fourth, fifth and right sixth lateral rib fractures. These are minimally displaced. No definite left-sided rib fractures. The sternum is intact. Scoliosis and degenerative changes involving the thoracic spine but no acute fracture. Extensive soft tissue density involving both breasts. This is unchanged since the prior CT scan and I suspected some type of remote silicone injections. CT ABDOMEN PELVIS FINDINGS Hepatobiliary: No focal hepatic lesions or evidence of acute hepatic injury. No perihepatic fluid collections. Gallbladder is grossly normal. There is intra and extrahepatic biliary dilatation. This is new since the CT scan from September 2020. The common bile duct measures a maximum of 13.5 mm in the head of the pancreas. I do not see an obvious common bile duct stone, ampullary region or pancreatic head mass to account for the dilatation. There could be a distal stricture. Recommend correlation with liver function studies. Patient probably not a good candidate for MRCP given limited breath holding for the CT scan. Pancreas: No mass, inflammation or ductal dilatation.  Moderate pancreatic atrophy. Spleen: Normal size. No focal lesions. Adrenals/Urinary Tract: The adrenal glands are unremarkable. The left kidney is small and demonstrates renal cortical thinning. No renal lesions or hydronephrosis. The bladder is unremarkable. Stomach/Bowel: The stomach, duodenum, small bowel and colon are grossly normal without oral contrast. No acute inflammatory changes, mass lesions or obstructive findings. Vascular/Lymphatic: Advanced atherosclerotic calcifications involving the tortuous abdominal aorta but no aneurysm. The branch vessels are patent. The major venous structures are patent. No mesenteric or retroperitoneal mass or adenopathy. Reproductive: Surgically absent. Other: No pelvic mass or free pelvic fluid collections. Moderate artifact through the lower pelvis due to bilateral hip hardware. Musculoskeletal: Severe scoliosis and associated degenerative changes. No acute bony findings or destructive bony changes. IMPRESSION: 1. Right fourth, fifth and sixth lateral rib fractures. No pneumothorax or pulmonary contusion. 2. No acute abdominal/pelvic findings, mass lesions or adenopathy. 3. Intra and extrahepatic biliary dilatation, new since CT scan of September 2020. No obvious obstructing common bile duct stone, ampullary lesion or pancreatic head mass. Recommend correlation with liver function studies. Patient probably not a good candidate for MRCP given limited breath holding for the CT scan. 4. Advanced atherosclerotic calcifications involving the thoracic and abdominal aorta and branch vessels including the coronary arteries. Aortic Atherosclerosis (ICD10-I70.0). Electronically Signed   By: Marijo Sanes M.D.   On: 03/08/2020 11:54   CT Cervical Spine Wo Contrast  Result Date: 03/08/2020 CLINICAL DATA:  Unwitnessed fall. No loss of consciousness. EXAM: CT HEAD WITHOUT CONTRAST CT CERVICAL SPINE WITHOUT CONTRAST TECHNIQUE: Multidetector CT imaging of the head and cervical spine  was performed following the standard protocol without intravenous contrast. Multiplanar CT image reconstructions of the cervical spine were also generated. COMPARISON:  February 05, 2020. FINDINGS: CT HEAD FINDINGS Brain: Mild diffuse cortical atrophy is noted. Mild chronic ischemic white matter disease is noted. Old right cerebellar infarction is noted. No mass effect or midline shift is noted. Ventricular size is within normal limits. There is no evidence of mass lesion, hemorrhage or acute infarction. Vascular: No hyperdense vessel or unexpected calcification. Skull: Normal. Negative for fracture or focal lesion. Sinuses/Orbits: No acute finding.  Other: None. CT CERVICAL SPINE FINDINGS Alignment: Normal. Skull base and vertebrae: No acute fracture. No primary bone lesion or focal pathologic process. Soft tissues and spinal canal: No prevertebral fluid or swelling. No visible canal hematoma. Disc levels: Severe degenerative disc disease is noted at C4-5, C5-6 and C6-7 with anterior osteophyte formation. Upper chest: Negative. Other: None. IMPRESSION: 1. Mild diffuse cortical atrophy. Mild chronic ischemic white matter disease. No acute intracranial abnormality seen. 2. Severe multilevel degenerative disc disease. No acute abnormality seen in the cervical spine. Electronically Signed   By: Marijo Conception M.D.   On: 03/08/2020 11:47   CT Abdomen Pelvis W Contrast  Result Date: 03/08/2020 CLINICAL DATA:  [place] out of bed this morning. Patient complaining of right chest pain. EXAM: CT CHEST, ABDOMEN, AND PELVIS WITH CONTRAST TECHNIQUE: Multidetector CT imaging of the chest, abdomen and pelvis was performed following the standard protocol during bolus administration of intravenous contrast. CONTRAST:  103mL OMNIPAQUE IOHEXOL 300 MG/ML  SOLN COMPARISON:  08/31/2019 FINDINGS: CT CHEST FINDINGS Cardiovascular: The heart is normal in size. No pericardial effusion. There is mild tortuosity, ectasia and calcification of  the thoracic aorta but no aneurysm or dissection. Three-vessel coronary artery calcifications are noted. Mediastinum/Nodes: No mediastinal or hilar mass or adenopathy or hematoma. The esophagus is grossly normal. Small scattered thyroid nodules are stable. Lungs/Pleura: No acute pulmonary findings. No pulmonary contusion, pneumothorax or pleural effusion. There is streaky bibasilar atelectasis and focal eventration of the right hemidiaphragm. No worrisome pulmonary lesions. The central tracheobronchial tree is unremarkable. Musculoskeletal: There are fourth, fifth and right sixth lateral rib fractures. These are minimally displaced. No definite left-sided rib fractures. The sternum is intact. Scoliosis and degenerative changes involving the thoracic spine but no acute fracture. Extensive soft tissue density involving both breasts. This is unchanged since the prior CT scan and I suspected some type of remote silicone injections. CT ABDOMEN PELVIS FINDINGS Hepatobiliary: No focal hepatic lesions or evidence of acute hepatic injury. No perihepatic fluid collections. Gallbladder is grossly normal. There is intra and extrahepatic biliary dilatation. This is new since the CT scan from September 2020. The common bile duct measures a maximum of 13.5 mm in the head of the pancreas. I do not see an obvious common bile duct stone, ampullary region or pancreatic head mass to account for the dilatation. There could be a distal stricture. Recommend correlation with liver function studies. Patient probably not a good candidate for MRCP given limited breath holding for the CT scan. Pancreas: No mass, inflammation or ductal dilatation. Moderate pancreatic atrophy. Spleen: Normal size. No focal lesions. Adrenals/Urinary Tract: The adrenal glands are unremarkable. The left kidney is small and demonstrates renal cortical thinning. No renal lesions or hydronephrosis. The bladder is unremarkable. Stomach/Bowel: The stomach, duodenum,  small bowel and colon are grossly normal without oral contrast. No acute inflammatory changes, mass lesions or obstructive findings. Vascular/Lymphatic: Advanced atherosclerotic calcifications involving the tortuous abdominal aorta but no aneurysm. The branch vessels are patent. The major venous structures are patent. No mesenteric or retroperitoneal mass or adenopathy. Reproductive: Surgically absent. Other: No pelvic mass or free pelvic fluid collections. Moderate artifact through the lower pelvis due to bilateral hip hardware. Musculoskeletal: Severe scoliosis and associated degenerative changes. No acute bony findings or destructive bony changes. IMPRESSION: 1. Right fourth, fifth and sixth lateral rib fractures. No pneumothorax or pulmonary contusion. 2. No acute abdominal/pelvic findings, mass lesions or adenopathy. 3. Intra and extrahepatic biliary dilatation, new since CT scan of September  2020. No obvious obstructing common bile duct stone, ampullary lesion or pancreatic head mass. Recommend correlation with liver function studies. Patient probably not a good candidate for MRCP given limited breath holding for the CT scan. 4. Advanced atherosclerotic calcifications involving the thoracic and abdominal aorta and branch vessels including the coronary arteries. Aortic Atherosclerosis (ICD10-I70.0). Electronically Signed   By: Marijo Sanes M.D.   On: 03/08/2020 11:54   US Abdomen Limited RUQ  Result Date: 03/08/2020 CLINICAL DATA:  Dilated bile duct. EXAM: ULTRASOUND ABDOMEN LIMITED RIGHT UPPER QUADRANT COMPARISON:  CT chest abdomen pelvis earlier this day. Patient apparently fell out of bed. Right rib fractures. FINDINGS: Patient had difficulty tolerating the exam due to pain. Examination is of limited diagnostic utility. Gallbladder: Appears physiologically distended. Probable intraluminal gallstone. No sonographic Murphy sign noted by sonographer. Common bile duct: Not demonstrated due to pain and  motion. Liver: Limited assessment of liver parenchyma. No obvious focal lesion. Portal vein could not be evaluated due to patient's pain. Other: No right upper quadrant free fluid. IMPRESSION: 1. Technically limited exam, of limited diagnostic utility. Patient did not tolerate exam due to acute right rib fractures. 2. Biliary ductal dilatation on CT is not well-visualized or further evaluated due to patient condition. 3. Probable gallstones without gallbladder inflammation. 4. Recommend follow-up exam after resolution of acute event when patient is better able to tolerate probe pressure. Electronically Signed   By: Keith Rake M.D.   On: 03/08/2020 14:25    Procedures Procedures (including critical care time)  Medications Ordered in ED Medications  fentaNYL (SUBLIMAZE) injection 100 mcg (100 mcg Intravenous Given 03/08/20 [id number])    ED Course  I have reviewed the triage vital signs and the nursing notes.  Pertinent labs & imaging results that were available during my care of the patient were reviewed by me and considered in my medical decision making (see chart for details).    MDM Rules/Calculators/A&P                      Alert, GCS 15, shallow breathing due to pain but no resp distress. Unclear whether she hit head or lost consciousness. Labs show WBC 14.4, Hgb 11.5, normal Cr, normal CK. CXR w/ R rib fx. Obtained CTs for further evaluation.   CT head and C-spine negative acute.  CT of chest through pelvis shows right fourth, fifth, and sixth rib fractures with no pneumothorax or contusion.  Incidental finding of biliary dilatation without obvious stone.  LFTs are normal and patient denies any abdominal pain.  She continues to have rib pain requiring repeat doses of fentanyl and has been shallow breathing because of the severity of her pain.  I recommended admission for pain control.  Consulted surgery and discussed with Dr. Barry Dienes, they will see the patient in consultation but recommended  medicine admission given age and comorbidities.  Discussed with [facility], Dr. Olevia Bowens, regarding admission to [facility] to allow trauma surgery consultation.  Final Clinical Impression(s) / ED Diagnoses Final diagnoses:  Closed fracture of multiple ribs, unspecified laterality, initial encounter    Rx / DC Orders ED Discharge Orders    None       Gianina Olinde, Wenda Overland, MD 03/08/20 1437

## 2020-03-08 NOTE — Consult Note (Addendum)
[facility] Consult Note   Angelica Bell 1932-07-22  [id number].     Requesting MD: Reubin Milan Chief Complaint:  Fall at home Reason for Consult: fall with fracture right 4th, 5th, and 6th ribs   HPI: Patient is an 86 year old female transported by EMS after reporting she fell out of bed around 1 AM this morning.  She was not sure what happened says she was suddenly on the floor.  Her grandson found her on the floor again this AM. She reported severe pain in her ribs on the right.  It hurts to take a breath.  She did drop her oxygen saturation down to 89% on room air secondary to pain.  She was placed on nasal cannula O2 in the ED.  She was recently hospitalized 2/6-02/08/2020, she also fell out of bed at that time.  She also had issues with urinary retention during that hospitalization.  She has a history of multiple falls at home; she thinks 2-3 so far this year.  She has acute on chronic back pain on chronic narcotics and Neurontin.  She was ultimately discharged to skilled nursing facility, and says she was there for a couple weeks and is now back at home.  Her grandson comes in each morning and PM.  He brings her meals and takes her shopping.   She has a history of hypertension, chronic back pain, history of PE in 1967.   Patient was brought to the emergency department at [facility]. Work-up in the ED shows she is afebrile vital signs are stable.  She did desaturate on room air was placed on a 2 L nasal cannula.  CMP is normal except for glucose of 128, BUN of 26, creatinine 0.84, total protein 6.4.  CK is 197.  WBC 14.4, hemoglobin 11.5, hematocrit 38.2, platelets 309,000. Chest x-ray shows an acute right lateral fourth and fifth rib fractures no associated pneumothorax or effusion.        CT of the head with contrast shows mild diffuse cortical atrophy, mild chronic ischemic white matter disease no acute intracranial abnormality.  There is severe multilevel  degenerative disc disease no acute abnormality seen in the cervical spine.      Chest CT and CT of the abdomen the pelvis shows right fourth, fifth, and sixth lateral rib fractures.  No pneumothorax or pulmonary contusions.  There were no acute abdominal pelvic findings, mass lesions or adenopathy.  Intra and extrahepatic biliary dilatation new since his CT scan of September 2020.  No obvious obstructing common bile duct stones ampullary lesions or pancreatic head mass.  Advanced atherosclerotic calcification involving the thoracic and abdominal aorta and branching vessels including the coronary arteries.  Trauma service is ask to consult. Medicine is admitting, she will need to be transferred to [facility] for Trauma to follow.       ROS: Review of Systems  Constitutional: Positive for weight loss (she isn't sure how much or time period ).  HENT: Negative.   Eyes: Negative.   Respiratory: Negative.   Cardiovascular: Negative.   Gastrointestinal: Negative.   Genitourinary:       Wears Depends garments 24/7  Musculoskeletal: Positive for back pain (Chronic back pain  PMP shows chronic use going back to 2019).       Complained of some left foot pain for several months.  Skin: Negative.   Neurological: Negative.   Endo/Heme/Allergies: Bruises/bleeds easily.  Psychiatric/Behavioral: Positive for memory loss.  On chronic pain  meds for back for some time back to 2019           Family History  Problem Relation Age of Onset  . Heart Problems Mother            Past Medical History:  Diagnosis Date  . Chronic back pain      with degenerative joint disease  . Chronic pain disorder      with narcotic management  . Dry eyes    . Hypertension    . Osteoporosis    . PE (pulmonary embolism)      After hysterectomy in 1967  . Pneumonia             Past Surgical History:  Procedure Laterality Date  . ABDOMINAL HYSTERECTOMY      . APPENDECTOMY      . BREAST ENHANCEMENT SURGERY      .  CATARACT EXTRACTION      . FEMUR IM NAIL Left 02/17/2017    Procedure: INTRAMEDULLARY (IM) NAIL FEMORAL;  Surgeon: Nicholes Stairs, MD;  Location: [facility];  Service: Orthopedics;  Laterality: Left;  . HIP ARTHROPLASTY Right 12/24/2015    Procedure: ARTHROPLASTY BIPOLAR HIP (HEMIARTHROPLASTY);  Surgeon: Netta Cedars, MD;  Location: [facility];  Service: Orthopedics;  Laterality: Right;  . TONSILLECTOMY          Social History:  reports that she quit smoking about 43 years ago. She quit after 13.00 years of use. She has never used smokeless tobacco. She reports that she does not drink alcohol or use drugs.   Allergies:  Allergies  Allergen Reactions  . Hctz [Hydrochlorothiazide]        Significant and rapid hyponatremia (TIH)  . Amoxicillin-Pot Clavulanate Hives and Diarrhea      Has patient had a PCN reaction causing immediate rash, facial/tongue/throat swelling, SOB or lightheadedness with hypotension: yes Has patient had a PCN reaction causing severe rash involving mucus membranes or skin necrosis: yes Has patient had a PCN reaction that required hospitalization : unknown Has patient had a PCN reaction occurring within the last 10 years: unknown If all of the above answers are "NO", then may proceed with Cephalosporin use.    Marland Kitchen Lisinopril-Hydrochlorothiazide Other (See Comments)      Tired- no energy to do anything..  . Sulfa Antibiotics Hives and Nausea And Vomiting  . Sulfonamide Derivatives Hives             Prior to Admission medications   Medication Sig Start Date End Date Taking? Authorizing Provider  amLODipine (NORVASC) 10 MG tablet Take 1 tablet (10 mg total) by mouth daily. 09/08/19 02/05/20   Kayleen Memos, DO  aspirin EC 81 MG EC tablet Take 1 tablet (81 mg total) by mouth daily. 09/15/19     Matcha, Beverely Pace, MD  atorvastatin (LIPITOR) 10 MG tablet Take 1 tablet (10 mg total) by mouth daily at 6 PM. Patient not taking: Reported on 02/05/2020 08/07/19     Mariel Aloe, MD    escitalopram (LEXAPRO) 10 MG tablet Take 10 mg by mouth daily. 01/10/20     [provider]  escitalopram (LEXAPRO) 20 MG tablet Take 20 mg by mouth daily. 02/23/20     [provider]  ferrous sulfate 325 (65 FE) MG EC tablet Take 325 mg by mouth daily. 01/12/20     [provider]  furosemide (LASIX) 20 MG tablet Take 20 mg by mouth daily. 01/10/20  [provider]  gabapentin (NEURONTIN) 100 MG capsule Take 1 capsule (100 mg total) by mouth 3 (three) times daily. 02/08/20     Harold Hedge, MD  MYRBETRIQ 25 MG TB24 tablet Take 25 mg by mouth daily. 02/23/20     [provider]  oxycodone (ROXICODONE) 30 MG immediate release tablet Take 0.5 tablets (15 mg total) by mouth 3 (three) times daily as needed for pain. 02/08/20     Harold Hedge, MD  pantoprazole (PROTONIX) 40 MG tablet Take 1 tablet (40 mg total) by mouth daily. Patient not taking: Reported on 02/05/2020 09/08/19     Kayleen Memos, DO  polyethylene glycol (MIRALAX / GLYCOLAX) 17 g packet Take 17 g by mouth daily. Patient taking differently: Take 17 g by mouth daily as needed for mild constipation or moderate constipation.  09/08/19     Kayleen Memos, DO        Blood pressure (!) 111/48, pulse 64, temperature 97.9 F (36.6 C), temperature source Oral, resp. rate 12, weight 47.6 kg, SpO2 99 %. Physical Exam:  General: pleasant, elderly, frail 86 y/o woman on gurney with ongoing pain right lateral chest, and back HEENT: head is normocephalic, atraumatic.  Sclera are noninjected.  Pupils are equal.    Mouth is pink and moist Heart: regular, rate, and rhythm.  Normal s1,s2. No obvious murmurs, gallops, or rubs noted.  Palpable radial and pedal pulses bilaterally.distal pulses are present but diminished. Lungs: CTAB, no wheezes, rhonchi.  Bilateral rales noted both bases.  Respiratory effort is painful, and she has pain lying still or moving.  She does move fairly well, breathing is nonlabored. Abd:  soft, NT, ND, +BS, no masses, hernias, or organomegaly MS: all 4 extremities are symmetrical with no cyanosis, clubbing, or edema. Skin: warm and dry with no masses, lesions, or rashes Neuro: Cranial nerves 2-12 grossly intact, sensation is normal throughout Psych: A&O, she isn't sure of the date, got the year right, it took awhile but she remembered Barbette Or was Software engineer.  Appropriate affect, somewhat decreased memory. She says she does her own pills, but was not really sure about the listed medicine with me.       Lab Results Last 48 Hours        Results for orders placed or performed during the hospital encounter of 03/08/20 (from the past 48 hour(s))  Comprehensive metabolic panel     Status: Abnormal    Collection Time: 03/08/20  8:16 AM  Result Value Ref Range    Sodium 137 135 - 145 mmol/L    Potassium 3.9 3.5 - 5.1 mmol/L    Chloride 99 98 - 111 mmol/L    CO2 30 22 - 32 mmol/L    Glucose, Bld 128 (H) 70 - 99 mg/dL      Comment: Glucose reference range applies only to samples taken after fasting for at least 8 hours.    BUN 26 (H) 8 - 23 mg/dL    Creatinine, Ser 0.84 0.44 - 1.00 mg/dL    Calcium 9.0 8.9 - 10.3 mg/dL    Total Protein 6.4 (L) 6.5 - 8.1 g/dL    Albumin 3.7 3.5 - 5.0 g/dL    AST 32 15 - 41 U/L    ALT 19 0 - 44 U/L    Alkaline Phosphatase 86 38 - 126 U/L    Total Bilirubin 0.4 0.3 - 1.2 mg/dL    GFR calc non Af Amer >60 >60 mL/min  GFR calc Af Amer >60 >60 mL/min    Anion gap 8 5 - 15      Comment: Performed at [facility], [place] [address] [place], [place] [address]  CBC     Status: Abnormal    Collection Time: 03/08/20  8:16 AM  Result Value Ref Range    WBC 14.4 (H) 4.0 - 10.5 K/uL    RBC 4.22 3.87 - 5.11 MIL/uL    Hemoglobin 11.5 (L) 12.0 - 15.0 g/dL    HCT 38.2 36.0 - 46.0 %    MCV 90.5 80.0 - 100.0 fL    MCH 27.3 26.0 - 34.0 pg    MCHC 30.1 30.0 - 36.0 g/dL    RDW 13.7 11.5 - 15.5 %    Platelets 309 150 - 400 K/uL    nRBC 0.0 0.0  - 0.2 %      Comment: Performed at [facility], [place] [address], [place], [place] [address]  CK     Status: None    Collection Time: 03/08/20  8:16 AM  Result Value Ref Range    Total CK 197 38 - 234 U/L      Comment: Performed at [facility], [place] [address], [place], [place] [address]       Imaging Results (Last 48 hours)  DG Chest 2 View   Result Date: 03/08/2020 CLINICAL DATA:  Right rib pain, fall EXAM: CHEST - 2 VIEW COMPARISON:  02/05/2020 FINDINGS: Hiatal hernia, stable. Low lung volumes. Stable bibasilar opacities could reflect scarring or atelectasis. Stable bronchitic changes. Heart is normal size. Old healed left humeral neck fracture. Fractures through the lateral right 4th and 5th ribs, new since prior study. No effusion or pneumothorax. IMPRESSION: Acute right lateral 4th and 5th rib fractures. No associated effusion or pneumothorax. Low lung volumes.  Bibasilar scarring or atelectasis. Stable large hiatal hernia. Electronically Signed   By: Rolm Baptise M.D.   On: 03/08/2020 09:06    CT Head Wo Contrast   Result Date: 03/08/2020 CLINICAL DATA:  Unwitnessed fall. No loss of consciousness. EXAM: CT HEAD WITHOUT CONTRAST CT CERVICAL SPINE WITHOUT CONTRAST TECHNIQUE: Multidetector CT imaging of the head and cervical spine was performed following the standard protocol without intravenous contrast. Multiplanar CT image reconstructions of the cervical spine were also generated. COMPARISON:  February 05, 2020. FINDINGS: CT HEAD FINDINGS Brain: Mild diffuse cortical atrophy is noted. Mild chronic ischemic white matter disease is noted. Old right cerebellar infarction is noted. No mass effect or midline shift is noted. Ventricular size is within normal limits. There is no evidence of mass lesion, hemorrhage or acute infarction. Vascular: No hyperdense vessel or unexpected calcification. Skull: Normal. Negative for fracture or focal lesion. Sinuses/Orbits:  No acute finding. Other: None. CT CERVICAL SPINE FINDINGS Alignment: Normal. Skull base and vertebrae: No acute fracture. No primary bone lesion or focal pathologic process. Soft tissues and spinal canal: No prevertebral fluid or swelling. No visible canal hematoma. Disc levels: Severe degenerative disc disease is noted at C4-5, C5-6 and C6-7 with anterior osteophyte formation. Upper chest: Negative. Other: None. IMPRESSION: 1. Mild diffuse cortical atrophy. Mild chronic ischemic white matter disease. No acute intracranial abnormality seen. 2. Severe multilevel degenerative disc disease. No acute abnormality seen in the cervical spine. Electronically Signed   By: Marijo Conception M.D.   On: 03/08/2020 11:47    CT Chest W Contrast   Result Date: 03/08/2020 CLINICAL DATA:  [place] out of  bed this morning. Patient complaining of right chest pain. EXAM: CT CHEST, ABDOMEN, AND PELVIS WITH CONTRAST TECHNIQUE: Multidetector CT imaging of the chest, abdomen and pelvis was performed following the standard protocol during bolus administration of intravenous contrast. CONTRAST:  20mL OMNIPAQUE IOHEXOL 300 MG/ML  SOLN COMPARISON:  08/31/2019 FINDINGS: CT CHEST FINDINGS Cardiovascular: The heart is normal in size. No pericardial effusion. There is mild tortuosity, ectasia and calcification of the thoracic aorta but no aneurysm or dissection. Three-vessel coronary artery calcifications are noted. Mediastinum/Nodes: No mediastinal or hilar mass or adenopathy or hematoma. The esophagus is grossly normal. Small scattered thyroid nodules are stable. Lungs/Pleura: No acute pulmonary findings. No pulmonary contusion, pneumothorax or pleural effusion. There is streaky bibasilar atelectasis and focal eventration of the right hemidiaphragm. No worrisome pulmonary lesions. The central tracheobronchial tree is unremarkable. Musculoskeletal: There are fourth, fifth and right sixth lateral rib fractures. These are minimally displaced. No  definite left-sided rib fractures. The sternum is intact. Scoliosis and degenerative changes involving the thoracic spine but no acute fracture. Extensive soft tissue density involving both breasts. This is unchanged since the prior CT scan and I suspected some type of remote silicone injections. CT ABDOMEN PELVIS FINDINGS Hepatobiliary: No focal hepatic lesions or evidence of acute hepatic injury. No perihepatic fluid collections. Gallbladder is grossly normal. There is intra and extrahepatic biliary dilatation. This is new since the CT scan from September 2020. The common bile duct measures a maximum of 13.5 mm in the head of the pancreas. I do not see an obvious common bile duct stone, ampullary region or pancreatic head mass to account for the dilatation. There could be a distal stricture. Recommend correlation with liver function studies. Patient probably not a good candidate for MRCP given limited breath holding for the CT scan. Pancreas: No mass, inflammation or ductal dilatation. Moderate pancreatic atrophy. Spleen: Normal size. No focal lesions. Adrenals/Urinary Tract: The adrenal glands are unremarkable. The left kidney is small and demonstrates renal cortical thinning. No renal lesions or hydronephrosis. The bladder is unremarkable. Stomach/Bowel: The stomach, duodenum, small bowel and colon are grossly normal without oral contrast. No acute inflammatory changes, mass lesions or obstructive findings. Vascular/Lymphatic: Advanced atherosclerotic calcifications involving the tortuous abdominal aorta but no aneurysm. The branch vessels are patent. The major venous structures are patent. No mesenteric or retroperitoneal mass or adenopathy. Reproductive: Surgically absent. Other: No pelvic mass or free pelvic fluid collections. Moderate artifact through the lower pelvis due to bilateral hip hardware. Musculoskeletal: Severe scoliosis and associated degenerative changes. No acute bony findings or destructive  bony changes. IMPRESSION: 1. Right fourth, fifth and sixth lateral rib fractures. No pneumothorax or pulmonary contusion. 2. No acute abdominal/pelvic findings, mass lesions or adenopathy. 3. Intra and extrahepatic biliary dilatation, new since CT scan of September 2020. No obvious obstructing common bile duct stone, ampullary lesion or pancreatic head mass. Recommend correlation with liver function studies. Patient probably not a good candidate for MRCP given limited breath holding for the CT scan. 4. Advanced atherosclerotic calcifications involving the thoracic and abdominal aorta and branch vessels including the coronary arteries. Aortic Atherosclerosis (ICD10-I70.0). Electronically Signed   By: Marijo Sanes M.D.   On: 03/08/2020 11:54    CT Cervical Spine Wo Contrast   Result Date: 03/08/2020 CLINICAL DATA:  Unwitnessed fall. No loss of consciousness. EXAM: CT HEAD WITHOUT CONTRAST CT CERVICAL SPINE WITHOUT CONTRAST TECHNIQUE: Multidetector CT imaging of the head and cervical spine was performed following the standard protocol without intravenous contrast. Multiplanar  CT image reconstructions of the cervical spine were also generated. COMPARISON:  February 05, 2020. FINDINGS: CT HEAD FINDINGS Brain: Mild diffuse cortical atrophy is noted. Mild chronic ischemic white matter disease is noted. Old right cerebellar infarction is noted. No mass effect or midline shift is noted. Ventricular size is within normal limits. There is no evidence of mass lesion, hemorrhage or acute infarction. Vascular: No hyperdense vessel or unexpected calcification. Skull: Normal. Negative for fracture or focal lesion. Sinuses/Orbits: No acute finding. Other: None. CT CERVICAL SPINE FINDINGS Alignment: Normal. Skull base and vertebrae: No acute fracture. No primary bone lesion or focal pathologic process. Soft tissues and spinal canal: No prevertebral fluid or swelling. No visible canal hematoma. Disc levels: Severe degenerative  disc disease is noted at C4-5, C5-6 and C6-7 with anterior osteophyte formation. Upper chest: Negative. Other: None. IMPRESSION: 1. Mild diffuse cortical atrophy. Mild chronic ischemic white matter disease. No acute intracranial abnormality seen. 2. Severe multilevel degenerative disc disease. No acute abnormality seen in the cervical spine. Electronically Signed   By: Marijo Conception M.D.   On: 03/08/2020 11:47    CT Abdomen Pelvis W Contrast   Result Date: 03/08/2020 CLINICAL DATA:  [place] out of bed this morning. Patient complaining of right chest pain. EXAM: CT CHEST, ABDOMEN, AND PELVIS WITH CONTRAST TECHNIQUE: Multidetector CT imaging of the chest, abdomen and pelvis was performed following the standard protocol during bolus administration of intravenous contrast. CONTRAST:  84mL OMNIPAQUE IOHEXOL 300 MG/ML  SOLN COMPARISON:  08/31/2019 FINDINGS: CT CHEST FINDINGS Cardiovascular: The heart is normal in size. No pericardial effusion. There is mild tortuosity, ectasia and calcification of the thoracic aorta but no aneurysm or dissection. Three-vessel coronary artery calcifications are noted. Mediastinum/Nodes: No mediastinal or hilar mass or adenopathy or hematoma. The esophagus is grossly normal. Small scattered thyroid nodules are stable. Lungs/Pleura: No acute pulmonary findings. No pulmonary contusion, pneumothorax or pleural effusion. There is streaky bibasilar atelectasis and focal eventration of the right hemidiaphragm. No worrisome pulmonary lesions. The central tracheobronchial tree is unremarkable. Musculoskeletal: There are fourth, fifth and right sixth lateral rib fractures. These are minimally displaced. No definite left-sided rib fractures. The sternum is intact. Scoliosis and degenerative changes involving the thoracic spine but no acute fracture. Extensive soft tissue density involving both breasts. This is unchanged since the prior CT scan and I suspected some type of remote silicone  injections. CT ABDOMEN PELVIS FINDINGS Hepatobiliary: No focal hepatic lesions or evidence of acute hepatic injury. No perihepatic fluid collections. Gallbladder is grossly normal. There is intra and extrahepatic biliary dilatation. This is new since the CT scan from September 2020. The common bile duct measures a maximum of 13.5 mm in the head of the pancreas. I do not see an obvious common bile duct stone, ampullary region or pancreatic head mass to account for the dilatation. There could be a distal stricture. Recommend correlation with liver function studies. Patient probably not a good candidate for MRCP given limited breath holding for the CT scan. Pancreas: No mass, inflammation or ductal dilatation. Moderate pancreatic atrophy. Spleen: Normal size. No focal lesions. Adrenals/Urinary Tract: The adrenal glands are unremarkable. The left kidney is small and demonstrates renal cortical thinning. No renal lesions or hydronephrosis. The bladder is unremarkable. Stomach/Bowel: The stomach, duodenum, small bowel and colon are grossly normal without oral contrast. No acute inflammatory changes, mass lesions or obstructive findings. Vascular/Lymphatic: Advanced atherosclerotic calcifications involving the tortuous abdominal aorta but no aneurysm. The branch vessels are patent. The  major venous structures are patent. No mesenteric or retroperitoneal mass or adenopathy. Reproductive: Surgically absent. Other: No pelvic mass or free pelvic fluid collections. Moderate artifact through the lower pelvis due to bilateral hip hardware. Musculoskeletal: Severe scoliosis and associated degenerative changes. No acute bony findings or destructive bony changes. IMPRESSION: 1. Right fourth, fifth and sixth lateral rib fractures. No pneumothorax or pulmonary contusion. 2. No acute abdominal/pelvic findings, mass lesions or adenopathy. 3. Intra and extrahepatic biliary dilatation, new since CT scan of September 2020. No obvious  obstructing common bile duct stone, ampullary lesion or pancreatic head mass. Recommend correlation with liver function studies. Patient probably not a good candidate for MRCP given limited breath holding for the CT scan. 4. Advanced atherosclerotic calcifications involving the thoracic and abdominal aorta and branch vessels including the coronary arteries. Aortic Atherosclerosis (ICD10-I70.0). Electronically Signed   By: Marijo Sanes M.D.   On: 03/08/2020 11:54           Assessment/Plan Recurrent falls from bed Fx 4th, 5th, and 6th right lateral ribs Chronic back pain - Neurontin & oxycodone Chronic narcotic use for pain control Hx rhabdomyolysis with AKI after fall 02/05/2020 DNR     Plan:  Pt is being admitted by Medicine and will transfer to [facility].  Trauma service will follow.  She will need pain control, IS, Flutter valve, PT/OT.  Will recheck CXR in AM.  I would place her back on her chronic medicines, add Toradol as long as her renal function remains stable. Continue IV narcotics PRN for pain not relieved by her chronic pain medicines.   She will need to be tested for COVID.  I will add Toradol for another 24 hours, but defer to Medicine for now so we don't have 2 services doing pain medicines.  Dr. Barry Dienes will see her when she gets to [facility].  If labs and CXR ok tomorrow she can start on DVT prophylaxis.     Earnstine Regal [facility] Surgery 03/08/2020, 1:14 PM Please see Amion for pager number during day hours 7:00am-4:30pm

## 2020-03-08 NOTE — H&P (Signed)
History and Physical    Angelica Bell [id number] DOB: [birth year] DOA: 03/08/2020  PCP: Heywood Bene, PA-C   Patient coming from: Home.  I have personally briefly reviewed patient's old medical records in [place]  Chief Complaint: Fall.  HPI: Angelica Bell is a 86 y.o. female with medical history significant of chronic back pain, chronic pain disorder with narcotic management, history of dry eyes, hypertension, osteoporosis, pulmonary embolism, pneumonia who was brought to the emergency department after falling from bed at home and developing right-sided pleuritic chest pain.  She does not remember exactly how it happened, but did not hit her head or had LOC.  She denies fever, chills, sore throat, precordial chest pain, dizziness, palpitations, diaphoresis, abdominal pain, nausea, vomiting, diarrhea, melena or hematochezia.  She gets constipated occasionally.  She denies dysuria, frequency or hematuria.  ED Course: Initial vital signs temperature 97.9 F, pulse 74, respirations 22, blood pressure 152/67 mmHg and O2 sat 91% on room air.  She received fentanyl 100 mcg in the ED.  Her lab work shows a white count of 14.4, hemoglobin 11.5 g/dL platelets 309.  CMP shows a glucose of 128 BUN was 26 mg/dL.  Total protein 6.4 g/dL.  The rest of the CMP is within normal range.  Imaging shows right lateral fourth, fifth and sixth rib fractures.  Review of Systems: As per HPI otherwise 10 point review of systems negative.   Past Medical History:  Diagnosis Date  . Chronic back pain    with degenerative joint disease  . Chronic pain disorder    with narcotic management  . Dry eyes   . Hypertension   . Osteoporosis   . PE (pulmonary embolism)    After hysterectomy in 1967  . Pneumonia     Past Surgical History:  Procedure Laterality Date  . ABDOMINAL HYSTERECTOMY    . APPENDECTOMY    . BREAST ENHANCEMENT SURGERY    . CATARACT EXTRACTION    . FEMUR IM NAIL  Left 02/17/2017   Procedure: INTRAMEDULLARY (IM) NAIL FEMORAL;  Surgeon: Nicholes Stairs, MD;  Location: [place];  Service: Orthopedics;  Laterality: Left;  . HIP ARTHROPLASTY Right 12/24/2015   Procedure: ARTHROPLASTY BIPOLAR HIP (HEMIARTHROPLASTY);  Surgeon: Netta Cedars, MD;  Location: [facility];  Service: Orthopedics;  Laterality: Right;  . TONSILLECTOMY       reports that she quit smoking about 43 years ago. She quit after 13.00 years of use. She has never used smokeless tobacco. She reports that she does not drink alcohol or use drugs.  Allergies  Allergen Reactions  . Hctz [Hydrochlorothiazide]     Significant and rapid hyponatremia (TIH)  . Amoxicillin-Pot Clavulanate Hives and Diarrhea    Has patient had a PCN reaction causing immediate rash, facial/tongue/throat swelling, SOB or lightheadedness with hypotension: yes Has patient had a PCN reaction causing severe rash involving mucus membranes or skin necrosis: yes Has patient had a PCN reaction that required hospitalization : unknown Has patient had a PCN reaction occurring within the last 10 years: unknown If all of the above answers are "NO", then may proceed with Cephalosporin use.   Marland Kitchen Lisinopril-Hydrochlorothiazide Other (See Comments)    Tired- no energy to do anything..  . Sulfa Antibiotics Hives and Nausea And Vomiting  . Sulfonamide Derivatives Hives    Family History  Problem Relation Age of Onset  . Heart Problems Mother    Prior to Admission medications   Medication Sig Start Date End  Date Taking? Authorizing Provider  amLODipine (NORVASC) 10 MG tablet Take 1 tablet (10 mg total) by mouth daily. 09/08/19 02/05/20  Kayleen Memos, DO  aspirin EC 81 MG EC tablet Take 1 tablet (81 mg total) by mouth daily. 09/15/19   Matcha, Beverely Pace, MD  atorvastatin (LIPITOR) 10 MG tablet Take 1 tablet (10 mg total) by mouth daily at 6 PM. Patient not taking: Reported on 02/05/2020 08/07/19   Mariel Aloe, MD  escitalopram (LEXAPRO) 10  MG tablet Take 10 mg by mouth daily. 01/10/20   [provider]  escitalopram (LEXAPRO) 20 MG tablet Take 20 mg by mouth daily. 02/23/20   [provider]  ferrous sulfate 325 (65 FE) MG EC tablet Take 325 mg by mouth daily. 01/12/20   [provider]  furosemide (LASIX) 20 MG tablet Take 20 mg by mouth daily. 01/10/20   [provider]  gabapentin (NEURONTIN) 100 MG capsule Take 1 capsule (100 mg total) by mouth 3 (three) times daily. 02/08/20   Harold Hedge, MD  MYRBETRIQ 25 MG TB24 tablet Take 25 mg by mouth daily. 02/23/20   [provider]  oxycodone (ROXICODONE) 30 MG immediate release tablet Take 0.5 tablets (15 mg total) by mouth 3 (three) times daily as needed for pain. 02/08/20   Harold Hedge, MD  pantoprazole (PROTONIX) 40 MG tablet Take 1 tablet (40 mg total) by mouth daily. Patient not taking: Reported on 02/05/2020 09/08/19   Kayleen Memos, DO  polyethylene glycol (MIRALAX / GLYCOLAX) 17 g packet Take 17 g by mouth daily. Patient taking differently: Take 17 g by mouth daily as needed for mild constipation or moderate constipation.  09/08/19   Kayleen Memos, DO    Physical Exam: Vitals:   03/08/20 1251 03/08/20 1300 03/08/20 1314 03/08/20 1315  BP:  (!) 111/48 (!) 111/48 (!) 105/45  Pulse: 67 65 64 65  Resp: 13 16 12 13   Temp:      TempSrc:      SpO2: 98% 99% 99% 98%  Weight:        Constitutional: Frail, elderly female, but in NAD, calm, comfortable Eyes: PERRL, lids and conjunctivae normal ENMT: Mucous membranes are moist. Posterior pharynx clear of any exudate or lesions. Neck: normal, supple, no masses, no thyromegaly Respiratory: Decreased breath sounds on bases, otherwise clear to auscultation bilaterally, no wheezing, no crackles. Normal respiratory effort. No accessory muscle use.  Cardiovascular: Regular rate and rhythm, no murmurs / rubs / gallops. No extremity edema. 2+ pedal pulses. No carotid bruits.  Abdomen: Nondistended.   BS positive.Soft, no tenderness, no masses palpated. No hepatosplenomegaly.   Musculoskeletal: Right chest wall tenderness.  No clubbing / cyanosis. Good ROM, no contractures. Normal muscle tone.  Skin: no significant rashes, lesions, ulcers on limited dermatological examination. Neurologic: CN 2-12 grossly intact. Sensation intact, DTR normal. Strength 5/5 in all 4.  Psychiatric: Alert and oriented x 2, partially oriented to time. Normal mood.   Labs on Admission: I have personally reviewed following labs and imaging studies  CBC: Recent Labs  Lab 03/08/20 0816  WBC 14.4*  HGB 11.5*  HCT 38.2  MCV 90.5  PLT [id number]   Basic Metabolic Panel: Recent Labs  Lab 03/08/20 0816  NA 137  K 3.9  CL 99  CO2 30  GLUCOSE 128*  BUN 26*  CREATININE 0.84  CALCIUM 9.0  MG 2.0  PHOS 4.3   GFR: Estimated Creatinine Clearance: 34.5 mL/min (by C-G formula based  on SCr of 0.84 mg/dL). Liver Function Tests: Recent Labs  Lab 03/08/20 0816  AST 32  ALT 19  ALKPHOS 86  BILITOT 0.4  PROT 6.4*  ALBUMIN 3.7   No results for input(s): LIPASE, AMYLASE in the last 168 hours. No results for input(s): AMMONIA in the last 168 hours. Coagulation Profile: No results for input(s): INR, PROTIME in the last 168 hours. Cardiac Enzymes: Recent Labs  Lab 03/08/20 0816  CKTOTAL 197   BNP (last 3 results) No results for input(s): PROBNP in the last 8760 hours. HbA1C: No results for input(s): HGBA1C in the last 72 hours. CBG: No results for input(s): GLUCAP in the last 168 hours. Lipid Profile: No results for input(s): CHOL, HDL, LDLCALC, TRIG, CHOLHDL, LDLDIRECT in the last 72 hours. Thyroid Function Tests: No results for input(s): TSH, T4TOTAL, FREET4, T3FREE, THYROIDAB in the last 72 hours. Anemia Panel: No results for input(s): VITAMINB12, FOLATE, FERRITIN, TIBC, IRON, RETICCTPCT in the last 72 hours. Urine analysis:  Radiological Exams on Admission: DG Chest 2 View  Result Date:  03/08/2020 CLINICAL DATA:  Right rib pain, fall EXAM: CHEST - 2 VIEW COMPARISON:  02/05/2020 FINDINGS: Hiatal hernia, stable. Low lung volumes. Stable bibasilar opacities could reflect scarring or atelectasis. Stable bronchitic changes. Heart is normal size. Old healed left humeral neck fracture. Fractures through the lateral right 4th and 5th ribs, new since prior study. No effusion or pneumothorax. IMPRESSION: Acute right lateral 4th and 5th rib fractures. No associated effusion or pneumothorax. Low lung volumes.  Bibasilar scarring or atelectasis. Stable large hiatal hernia. Electronically Signed   By: Rolm Baptise M.D.   On: 03/08/2020 09:06   CT Head Wo Contrast  Result Date: 03/08/2020 CLINICAL DATA:  Unwitnessed fall. No loss of consciousness. EXAM: CT HEAD WITHOUT CONTRAST CT CERVICAL SPINE WITHOUT CONTRAST TECHNIQUE: Multidetector CT imaging of the head and cervical spine was performed following the standard protocol without intravenous contrast. Multiplanar CT image reconstructions of the cervical spine were also generated. COMPARISON:  February 05, 2020. FINDINGS: CT HEAD FINDINGS Brain: Mild diffuse cortical atrophy is noted. Mild chronic ischemic white matter disease is noted. Old right cerebellar infarction is noted. No mass effect or midline shift is noted. Ventricular size is within normal limits. There is no evidence of mass lesion, hemorrhage or acute infarction. Vascular: No hyperdense vessel or unexpected calcification. Skull: Normal. Negative for fracture or focal lesion. Sinuses/Orbits: No acute finding. Other: None. CT CERVICAL SPINE FINDINGS Alignment: Normal. Skull base and vertebrae: No acute fracture. No primary bone lesion or focal pathologic process. Soft tissues and spinal canal: No prevertebral fluid or swelling. No visible canal hematoma. Disc levels: Severe degenerative disc disease is noted at C4-5, C5-6 and C6-7 with anterior osteophyte formation. Upper chest: Negative. Other:  None. IMPRESSION: 1. Mild diffuse cortical atrophy. Mild chronic ischemic white matter disease. No acute intracranial abnormality seen. 2. Severe multilevel degenerative disc disease. No acute abnormality seen in the cervical spine. Electronically Signed   By: Marijo Conception M.D.   On: 03/08/2020 11:47   CT Chest W Contrast  Result Date: 03/08/2020 CLINICAL DATA:  [place] out of bed this morning. Patient complaining of right chest pain. EXAM: CT CHEST, ABDOMEN, AND PELVIS WITH CONTRAST TECHNIQUE: Multidetector CT imaging of the chest, abdomen and pelvis was performed following the standard protocol during bolus administration of intravenous contrast. CONTRAST:  2mL OMNIPAQUE IOHEXOL 300 MG/ML  SOLN COMPARISON:  08/31/2019 FINDINGS: CT CHEST FINDINGS Cardiovascular: The heart is normal in  size. No pericardial effusion. There is mild tortuosity, ectasia and calcification of the thoracic aorta but no aneurysm or dissection. Three-vessel coronary artery calcifications are noted. Mediastinum/Nodes: No mediastinal or hilar mass or adenopathy or hematoma. The esophagus is grossly normal. Small scattered thyroid nodules are stable. Lungs/Pleura: No acute pulmonary findings. No pulmonary contusion, pneumothorax or pleural effusion. There is streaky bibasilar atelectasis and focal eventration of the right hemidiaphragm. No worrisome pulmonary lesions. The central tracheobronchial tree is unremarkable. Musculoskeletal: There are fourth, fifth and right sixth lateral rib fractures. These are minimally displaced. No definite left-sided rib fractures. The sternum is intact. Scoliosis and degenerative changes involving the thoracic spine but no acute fracture. Extensive soft tissue density involving both breasts. This is unchanged since the prior CT scan and I suspected some type of remote silicone injections. CT ABDOMEN PELVIS FINDINGS Hepatobiliary: No focal hepatic lesions or evidence of acute hepatic injury. No perihepatic  fluid collections. Gallbladder is grossly normal. There is intra and extrahepatic biliary dilatation. This is new since the CT scan from September 2020. The common bile duct measures a maximum of 13.5 mm in the head of the pancreas. I do not see an obvious common bile duct stone, ampullary region or pancreatic head mass to account for the dilatation. There could be a distal stricture. Recommend correlation with liver function studies. Patient probably not a good candidate for MRCP given limited breath holding for the CT scan. Pancreas: No mass, inflammation or ductal dilatation. Moderate pancreatic atrophy. Spleen: Normal size. No focal lesions. Adrenals/Urinary Tract: The adrenal glands are unremarkable. The left kidney is small and demonstrates renal cortical thinning. No renal lesions or hydronephrosis. The bladder is unremarkable. Stomach/Bowel: The stomach, duodenum, small bowel and colon are grossly normal without oral contrast. No acute inflammatory changes, mass lesions or obstructive findings. Vascular/Lymphatic: Advanced atherosclerotic calcifications involving the tortuous abdominal aorta but no aneurysm. The branch vessels are patent. The major venous structures are patent. No mesenteric or retroperitoneal mass or adenopathy. Reproductive: Surgically absent. Other: No pelvic mass or free pelvic fluid collections. Moderate artifact through the lower pelvis due to bilateral hip hardware. Musculoskeletal: Severe scoliosis and associated degenerative changes. No acute bony findings or destructive bony changes. IMPRESSION: 1. Right fourth, fifth and sixth lateral rib fractures. No pneumothorax or pulmonary contusion. 2. No acute abdominal/pelvic findings, mass lesions or adenopathy. 3. Intra and extrahepatic biliary dilatation, new since CT scan of September 2020. No obvious obstructing common bile duct stone, ampullary lesion or pancreatic head mass. Recommend correlation with liver function studies. Patient  probably not a good candidate for MRCP given limited breath holding for the CT scan. 4. Advanced atherosclerotic calcifications involving the thoracic and abdominal aorta and branch vessels including the coronary arteries. Aortic Atherosclerosis (ICD10-I70.0). Electronically Signed   By: Marijo Sanes M.D.   On: 03/08/2020 11:54   CT Cervical Spine Wo Contrast  Result Date: 03/08/2020 CLINICAL DATA:  Unwitnessed fall. No loss of consciousness. EXAM: CT HEAD WITHOUT CONTRAST CT CERVICAL SPINE WITHOUT CONTRAST TECHNIQUE: Multidetector CT imaging of the head and cervical spine was performed following the standard protocol without intravenous contrast. Multiplanar CT image reconstructions of the cervical spine were also generated. COMPARISON:  February 05, 2020. FINDINGS: CT HEAD FINDINGS Brain: Mild diffuse cortical atrophy is noted. Mild chronic ischemic white matter disease is noted. Old right cerebellar infarction is noted. No mass effect or midline shift is noted. Ventricular size is within normal limits. There is no evidence of mass lesion, hemorrhage or  acute infarction. Vascular: No hyperdense vessel or unexpected calcification. Skull: Normal. Negative for fracture or focal lesion. Sinuses/Orbits: No acute finding. Other: None. CT CERVICAL SPINE FINDINGS Alignment: Normal. Skull base and vertebrae: No acute fracture. No primary bone lesion or focal pathologic process. Soft tissues and spinal canal: No prevertebral fluid or swelling. No visible canal hematoma. Disc levels: Severe degenerative disc disease is noted at C4-5, C5-6 and C6-7 with anterior osteophyte formation. Upper chest: Negative. Other: None. IMPRESSION: 1. Mild diffuse cortical atrophy. Mild chronic ischemic white matter disease. No acute intracranial abnormality seen. 2. Severe multilevel degenerative disc disease. No acute abnormality seen in the cervical spine. Electronically Signed   By: Marijo Conception M.D.   On: 03/08/2020 11:47   CT  Abdomen Pelvis W Contrast  Result Date: 03/08/2020 CLINICAL DATA:  [place] out of bed this morning. Patient complaining of right chest pain. EXAM: CT CHEST, ABDOMEN, AND PELVIS WITH CONTRAST TECHNIQUE: Multidetector CT imaging of the chest, abdomen and pelvis was performed following the standard protocol during bolus administration of intravenous contrast. CONTRAST:  16mL OMNIPAQUE IOHEXOL 300 MG/ML  SOLN COMPARISON:  08/31/2019 FINDINGS: CT CHEST FINDINGS Cardiovascular: The heart is normal in size. No pericardial effusion. There is mild tortuosity, ectasia and calcification of the thoracic aorta but no aneurysm or dissection. Three-vessel coronary artery calcifications are noted. Mediastinum/Nodes: No mediastinal or hilar mass or adenopathy or hematoma. The esophagus is grossly normal. Small scattered thyroid nodules are stable. Lungs/Pleura: No acute pulmonary findings. No pulmonary contusion, pneumothorax or pleural effusion. There is streaky bibasilar atelectasis and focal eventration of the right hemidiaphragm. No worrisome pulmonary lesions. The central tracheobronchial tree is unremarkable. Musculoskeletal: There are fourth, fifth and right sixth lateral rib fractures. These are minimally displaced. No definite left-sided rib fractures. The sternum is intact. Scoliosis and degenerative changes involving the thoracic spine but no acute fracture. Extensive soft tissue density involving both breasts. This is unchanged since the prior CT scan and I suspected some type of remote silicone injections. CT ABDOMEN PELVIS FINDINGS Hepatobiliary: No focal hepatic lesions or evidence of acute hepatic injury. No perihepatic fluid collections. Gallbladder is grossly normal. There is intra and extrahepatic biliary dilatation. This is new since the CT scan from September 2020. The common bile duct measures a maximum of 13.5 mm in the head of the pancreas. I do not see an obvious common bile duct stone, ampullary region or  pancreatic head mass to account for the dilatation. There could be a distal stricture. Recommend correlation with liver function studies. Patient probably not a good candidate for MRCP given limited breath holding for the CT scan. Pancreas: No mass, inflammation or ductal dilatation. Moderate pancreatic atrophy. Spleen: Normal size. No focal lesions. Adrenals/Urinary Tract: The adrenal glands are unremarkable. The left kidney is small and demonstrates renal cortical thinning. No renal lesions or hydronephrosis. The bladder is unremarkable. Stomach/Bowel: The stomach, duodenum, small bowel and colon are grossly normal without oral contrast. No acute inflammatory changes, mass lesions or obstructive findings. Vascular/Lymphatic: Advanced atherosclerotic calcifications involving the tortuous abdominal aorta but no aneurysm. The branch vessels are patent. The major venous structures are patent. No mesenteric or retroperitoneal mass or adenopathy. Reproductive: Surgically absent. Other: No pelvic mass or free pelvic fluid collections. Moderate artifact through the lower pelvis due to bilateral hip hardware. Musculoskeletal: Severe scoliosis and associated degenerative changes. No acute bony findings or destructive bony changes. IMPRESSION: 1. Right fourth, fifth and sixth lateral rib fractures. No pneumothorax or pulmonary contusion.  2. No acute abdominal/pelvic findings, mass lesions or adenopathy. 3. Intra and extrahepatic biliary dilatation, new since CT scan of September 2020. No obvious obstructing common bile duct stone, ampullary lesion or pancreatic head mass. Recommend correlation with liver function studies. Patient probably not a good candidate for MRCP given limited breath holding for the CT scan. 4. Advanced atherosclerotic calcifications involving the thoracic and abdominal aorta and branch vessels including the coronary arteries. Aortic Atherosclerosis (ICD10-I70.0). Electronically Signed   By: Marijo Sanes M.D.   On: 03/08/2020 11:54   07/28/2019 echo IMPRESSIONS   1. The left ventricle has normal systolic function with an ejection  fraction of 60-65%. The cavity size was normal. Left ventricular diastolic  function could not be evaluated due to indeterminate diastolic function.  Elevated left ventricular  end-diastolic pressure  2. Inadequate images for regional wall motion assessment. Study ended  ealy due to patient request.  3. The right ventricle has normal systolic function. The cavity was  normal. There is no increase in right ventricular wall thickness. Right  ventricular systolic pressure could not be assessed.  4. The mitral valve is degenerative. Mild calcification of the anterior  mitral valve leaflet. There is moderate mitral annular calcification  present and trivial mitral regurgitation.  5. The tricuspid valve is grossly normal.  6. The aorta is normal in size and structure.  7. The aortic valve was not well visualized. Aortic valve regurgitation  was not assessed by color flow Doppler.   EKG: Independently reviewed.  Assessment/Plan Principal Problem:   Multiple rib fractures Observation/telemetry. Toradol 15 mg IVP x1. Percocet 7.5 325 every 6 hours as needed Incentive spirometry. Consult TOC. May need to consult PT. Trauma surgery will evaluate [facility].  Active Problems:   Hyperlipidemia Continue atorvastatin 10 mg p.o. daily.    ANXIETY DEPRESSION Continue Lexapro 20 mg p.o. daily.    Essential hypertension Hold amlodipine and furosemide due to soft BP. Monitor blood pressure.    Common bile duct dilatation Check RUQ ultrasound for further characterization.    Leukocytosis Likely stress-induced.    Normocytic anemia Improved from previous measurements. Monitor H&H.   DVT prophylaxis: SCDs. Code Status: DNR. Family Communication: Disposition Plan: Observation at [facility]/evaluation by trauma team. Consults called: Dr. Earnstine Regal. Admission status: Observation/telemetry.   Reubin Milan MD [facility]  If 7PM-7AM, please contact night-coverage [email or web address]  03/08/2020, 1:49 PM   This document was prepared using Dragon voice recognition software and may contain some unintended transcription errors.

## 2020-03-08 NOTE — ED Triage Notes (Signed)
Pt arrives [facility] from home the complaints of a fall out ofd bed this am about 1am. Per EMS: Pts grandson found her on the floor just PTA. Pt reports right rib pain. Pt denies LOC or head injury. Pt denies blood thinners. EMS administered 153mcg Fentanyl IV for right rib pain.

## 2020-03-08 NOTE — ED Notes (Signed)
Pt placed on bedpan. Pt requesting to stay on bedpan for a moment.

## 2020-03-08 NOTE — ED Notes (Signed)
Pts O2 sat dropped to 89% on RA. Pt states it hurts to take a deep breath.Pt placed on 2L o2 via [place]

## 2020-03-08 NOTE — ED Notes (Signed)
[facility] called for transport to [facility]  

## 2020-03-08 NOTE — H&P (Signed)
[facility] Surgery Admission Note  Angelica Bell October 27, 1932  [id number].    Requesting MD: Reubin Milan Chief Complaint:  Fall at home Reason for Consult: fall with fracture right 4th, 5th, and 6th ribs  HPI: Patient is an 86 year old female transported by EMS after reporting she fell out of bed around 1 AM this morning.  She was not sure what happened says she was suddenly on the floor.  Her grandson found her on the floor again this AM. She reported severe pain in her ribs on the right.  It hurts to take a breath.  She did drop her oxygen saturation down to 89% on room air secondary to pain.  She was placed on nasal cannula O2 in the ED.  She was recently hospitalized 2/6-02/08/2020, she also fell out of bed at that time.  She also had issues with urinary retention during that hospitalization.  She has a history of multiple falls at home; she thinks 2-3 so far this year.  She has acute on chronic back pain on chronic narcotics and Neurontin.  She was ultimately discharged to skilled nursing facility, and says she was there for a couple weeks and is now back at home.  Her grandson comes in each morning and PM.  He brings her meals and takes her shopping.   She has a history of hypertension, chronic back pain, history of PE in 1967.  Patient was brought to the emergency department at [facility]. Work-up in the ED shows she is afebrile vital signs are stable.  She did desaturate on room air was placed on a 2 L nasal cannula.  CMP is normal except for glucose of 128, BUN of 26, creatinine 0.84, total protein 6.4.  CK is 197.  WBC 14.4, hemoglobin 11.5, hematocrit 38.2, platelets 309,000. Chest x-ray shows an acute right lateral fourth and fifth rib fractures no associated pneumothorax or effusion.  CT of the head with contrast shows mild diffuse cortical atrophy, mild chronic ischemic white matter disease no acute intracranial abnormality.  There is severe multilevel degenerative  disc disease no acute abnormality seen in the cervical spine. Chest CT and CT of the abdomen the pelvis shows right fourth, fifth, and sixth lateral rib fractures.  No pneumothorax or pulmonary contusions.  There were no acute abdominal pelvic findings, mass lesions or adenopathy.  Intra and extrahepatic biliary dilatation new since his CT scan of September 2020.  No obvious obstructing common bile duct stones ampullary lesions or pancreatic head mass.  Advanced atherosclerotic calcification involving the thoracic and abdominal aorta and branching vessels including the coronary arteries.  Trauma service is ask to consult. Medicine is admitting, she will need to be transferred to [facility] for Trauma to follow.     ROS: Review of Systems  Constitutional: Positive for weight loss (she isn't sure how much or time period ).  HENT: Negative.   Eyes: Negative.   Respiratory: Negative.   Cardiovascular: Negative.   Gastrointestinal: Negative.   Genitourinary:       Wears Depends garments 24/7  Musculoskeletal: Positive for back pain (Chronic back pain  PMP shows chronic use going back to 2019).       Complained of some left foot pain for several months.  Skin: Negative.   Neurological: Negative.   Endo/Heme/Allergies: Bruises/bleeds easily.  Psychiatric/Behavioral: Positive for memory loss.       On chronic pain  meds for back for some time back to 2019    Family  History  Problem Relation Age of Onset  . Heart Problems Mother     Past Medical History:  Diagnosis Date  . Chronic back pain    with degenerative joint disease  . Chronic pain disorder    with narcotic management  . Dry eyes   . Hypertension   . Osteoporosis   . PE (pulmonary embolism)    After hysterectomy in 1967  . Pneumonia     Past Surgical History:  Procedure Laterality Date  . ABDOMINAL HYSTERECTOMY    . APPENDECTOMY    . BREAST ENHANCEMENT SURGERY    . CATARACT EXTRACTION    . FEMUR IM NAIL Left 02/17/2017    Procedure: INTRAMEDULLARY (IM) NAIL FEMORAL;  Surgeon: Nicholes Stairs, MD;  Location: [place];  Service: Orthopedics;  Laterality: Left;  . HIP ARTHROPLASTY Right 12/24/2015   Procedure: ARTHROPLASTY BIPOLAR HIP (HEMIARTHROPLASTY);  Surgeon: Netta Cedars, MD;  Location: [facility];  Service: Orthopedics;  Laterality: Right;  . TONSILLECTOMY      Social History:  reports that she quit smoking about 43 years ago. She quit after 13.00 years of use. She has never used smokeless tobacco. She reports that she does not drink alcohol or use drugs.  Allergies:  Allergies  Allergen Reactions  . Hctz [Hydrochlorothiazide]     Significant and rapid hyponatremia (TIH)  . Amoxicillin-Pot Clavulanate Hives and Diarrhea    Has patient had a PCN reaction causing immediate rash, facial/tongue/throat swelling, SOB or lightheadedness with hypotension: yes Has patient had a PCN reaction causing severe rash involving mucus membranes or skin necrosis: yes Has patient had a PCN reaction that required hospitalization : unknown Has patient had a PCN reaction occurring within the last 10 years: unknown If all of the above answers are "NO", then may proceed with Cephalosporin use.   Marland Kitchen Lisinopril-Hydrochlorothiazide Other (See Comments)    Tired- no energy to do anything..  . Sulfa Antibiotics Hives and Nausea And Vomiting  . Sulfonamide Derivatives Hives    Prior to Admission medications   Medication Sig Start Date End Date Taking? Authorizing Provider  amLODipine (NORVASC) 10 MG tablet Take 1 tablet (10 mg total) by mouth daily. 09/08/19 02/05/20  Kayleen Memos, DO  aspirin EC 81 MG EC tablet Take 1 tablet (81 mg total) by mouth daily. 09/15/19   Matcha, Beverely Pace, MD  atorvastatin (LIPITOR) 10 MG tablet Take 1 tablet (10 mg total) by mouth daily at 6 PM. Patient not taking: Reported on 02/05/2020 08/07/19   Mariel Aloe, MD  escitalopram (LEXAPRO) 10 MG tablet Take 10 mg by mouth daily. 01/10/20   [provider]  escitalopram (LEXAPRO) 20 MG tablet Take 20 mg by mouth daily. 02/23/20   [provider]  ferrous sulfate 325 (65 FE) MG EC tablet Take 325 mg by mouth daily. 01/12/20   [provider]  furosemide (LASIX) 20 MG tablet Take 20 mg by mouth daily. 01/10/20   [provider]  gabapentin (NEURONTIN) 100 MG capsule Take 1 capsule (100 mg total) by mouth 3 (three) times daily. 02/08/20   Harold Hedge, MD  MYRBETRIQ 25 MG TB24 tablet Take 25 mg by mouth daily. 02/23/20   [provider]  oxycodone (ROXICODONE) 30 MG immediate release tablet Take 0.5 tablets (15 mg total) by mouth 3 (three) times daily as needed for pain. 02/08/20   Harold Hedge, MD  pantoprazole (PROTONIX) 40 MG tablet Take 1 tablet (40 mg total) by mouth daily.  Patient not taking: Reported on 02/05/2020 09/08/19   Kayleen Memos, DO  polyethylene glycol (MIRALAX / GLYCOLAX) 17 g packet Take 17 g by mouth daily. Patient taking differently: Take 17 g by mouth daily as needed for mild constipation or moderate constipation.  09/08/19   Kayleen Memos, DO     Blood pressure (!) 111/48, pulse 64, temperature 97.9 F (36.6 C), temperature source Oral, resp. rate 12, weight 47.6 kg, SpO2 99 %. Physical Exam:  General: pleasant, elderly, frail 86 y/o woman on gurney with ongoing pain right lateral chest, and back HEENT: head is normocephalic, atraumatic.  Sclera are noninjected.  Pupils are equal.    Mouth is pink and moist Heart: regular, rate, and rhythm.  Normal s1,s2. No obvious murmurs, gallops, or rubs noted.  Palpable radial and pedal pulses bilaterally.distal pulses are present but diminished. Lungs: CTAB, no wheezes, rhonchi.  Bilateral rales noted both bases.  Respiratory effort is painful, and she has pain lying still or moving.  She does move fairly well, breathing is nonlabored. Abd: soft, NT, ND, +BS, no masses, hernias, or organomegaly MS: all 4 extremities are symmetrical with no  cyanosis, clubbing, or edema. Skin: warm and dry with no masses, lesions, or rashes Neuro: Cranial nerves 2-12 grossly intact, sensation is normal throughout Psych: A&O, she isn't sure of the date, got the year right, it took awhile but she remembered Barbette Or was Software engineer.  Appropriate affect, somewhat decreased memory. She says she does her own pills, but was not really sure about the listed medicine with me.     Results for orders placed or performed during the hospital encounter of 03/08/20 (from the past 48 hour(s))  Comprehensive metabolic panel     Status: Abnormal   Collection Time: 03/08/20  8:16 AM  Result Value Ref Range   Sodium 137 135 - 145 mmol/L   Potassium 3.9 3.5 - 5.1 mmol/L   Chloride 99 98 - 111 mmol/L   CO2 30 22 - 32 mmol/L   Glucose, Bld 128 (H) 70 - 99 mg/dL    Comment: Glucose reference range applies only to samples taken after fasting for at least 8 hours.   BUN 26 (H) 8 - 23 mg/dL   Creatinine, Ser 0.84 0.44 - 1.00 mg/dL   Calcium 9.0 8.9 - 10.3 mg/dL   Total Protein 6.4 (L) 6.5 - 8.1 g/dL   Albumin 3.7 3.5 - 5.0 g/dL   AST 32 15 - 41 U/L   ALT 19 0 - 44 U/L   Alkaline Phosphatase 86 38 - 126 U/L   Total Bilirubin 0.4 0.3 - 1.2 mg/dL   GFR calc non Af Amer >60 >60 mL/min   GFR calc Af Amer >60 >60 mL/min   Anion gap 8 5 - 15    Comment: Performed at [facility], [place] [address], [place], [place] [address]  CBC     Status: Abnormal   Collection Time: 03/08/20  8:16 AM  Result Value Ref Range   WBC 14.4 (H) 4.0 - 10.5 K/uL   RBC 4.22 3.87 - 5.11 MIL/uL   Hemoglobin 11.5 (L) 12.0 - 15.0 g/dL   HCT 38.2 36.0 - 46.0 %   MCV 90.5 80.0 - 100.0 fL   MCH 27.3 26.0 - 34.0 pg   MCHC 30.1 30.0 - 36.0 g/dL   RDW 13.7 11.5 - 15.5 %   Platelets 309 150 - 400 K/uL   nRBC 0.0 0.0 - 0.2 %  Comment: Performed at [facility], [place] [address], [place], [place] [address]  CK     Status: None   Collection Time: 03/08/20  8:16 AM   Result Value Ref Range   Total CK 197 38 - 234 U/L    Comment: Performed at [facility], [place] [address] [place], [place] [address]   DG Chest 2 View  Result Date: 03/08/2020 CLINICAL DATA:  Right rib pain, fall EXAM: CHEST - 2 VIEW COMPARISON:  02/05/2020 FINDINGS: Hiatal hernia, stable. Low lung volumes. Stable bibasilar opacities could reflect scarring or atelectasis. Stable bronchitic changes. Heart is normal size. Old healed left humeral neck fracture. Fractures through the lateral right 4th and 5th ribs, new since prior study. No effusion or pneumothorax. IMPRESSION: Acute right lateral 4th and 5th rib fractures. No associated effusion or pneumothorax. Low lung volumes.  Bibasilar scarring or atelectasis. Stable large hiatal hernia. Electronically Signed   By: Rolm Baptise M.D.   On: 03/08/2020 09:06   CT Head Wo Contrast  Result Date: 03/08/2020 CLINICAL DATA:  Unwitnessed fall. No loss of consciousness. EXAM: CT HEAD WITHOUT CONTRAST CT CERVICAL SPINE WITHOUT CONTRAST TECHNIQUE: Multidetector CT imaging of the head and cervical spine was performed following the standard protocol without intravenous contrast. Multiplanar CT image reconstructions of the cervical spine were also generated. COMPARISON:  February 05, 2020. FINDINGS: CT HEAD FINDINGS Brain: Mild diffuse cortical atrophy is noted. Mild chronic ischemic white matter disease is noted. Old right cerebellar infarction is noted. No mass effect or midline shift is noted. Ventricular size is within normal limits. There is no evidence of mass lesion, hemorrhage or acute infarction. Vascular: No hyperdense vessel or unexpected calcification. Skull: Normal. Negative for fracture or focal lesion. Sinuses/Orbits: No acute finding. Other: None. CT CERVICAL SPINE FINDINGS Alignment: Normal. Skull base and vertebrae: No acute fracture. No primary bone lesion or focal pathologic process. Soft tissues and spinal canal: No prevertebral  fluid or swelling. No visible canal hematoma. Disc levels: Severe degenerative disc disease is noted at C4-5, C5-6 and C6-7 with anterior osteophyte formation. Upper chest: Negative. Other: None. IMPRESSION: 1. Mild diffuse cortical atrophy. Mild chronic ischemic white matter disease. No acute intracranial abnormality seen. 2. Severe multilevel degenerative disc disease. No acute abnormality seen in the cervical spine. Electronically Signed   By: Marijo Conception M.D.   On: 03/08/2020 11:47   CT Chest W Contrast  Result Date: 03/08/2020 CLINICAL DATA:  [place] out of bed this morning. Patient complaining of right chest pain. EXAM: CT CHEST, ABDOMEN, AND PELVIS WITH CONTRAST TECHNIQUE: Multidetector CT imaging of the chest, abdomen and pelvis was performed following the standard protocol during bolus administration of intravenous contrast. CONTRAST:  34mL OMNIPAQUE IOHEXOL 300 MG/ML  SOLN COMPARISON:  08/31/2019 FINDINGS: CT CHEST FINDINGS Cardiovascular: The heart is normal in size. No pericardial effusion. There is mild tortuosity, ectasia and calcification of the thoracic aorta but no aneurysm or dissection. Three-vessel coronary artery calcifications are noted. Mediastinum/Nodes: No mediastinal or hilar mass or adenopathy or hematoma. The esophagus is grossly normal. Small scattered thyroid nodules are stable. Lungs/Pleura: No acute pulmonary findings. No pulmonary contusion, pneumothorax or pleural effusion. There is streaky bibasilar atelectasis and focal eventration of the right hemidiaphragm. No worrisome pulmonary lesions. The central tracheobronchial tree is unremarkable. Musculoskeletal: There are fourth, fifth and right sixth lateral rib fractures. These are minimally displaced. No definite left-sided rib fractures. The sternum is intact. Scoliosis and degenerative changes involving the thoracic  spine but no acute fracture. Extensive soft tissue density involving both breasts. This is unchanged since the  prior CT scan and I suspected some type of remote silicone injections. CT ABDOMEN PELVIS FINDINGS Hepatobiliary: No focal hepatic lesions or evidence of acute hepatic injury. No perihepatic fluid collections. Gallbladder is grossly normal. There is intra and extrahepatic biliary dilatation. This is new since the CT scan from September 2020. The common bile duct measures a maximum of 13.5 mm in the head of the pancreas. I do not see an obvious common bile duct stone, ampullary region or pancreatic head mass to account for the dilatation. There could be a distal stricture. Recommend correlation with liver function studies. Patient probably not a good candidate for MRCP given limited breath holding for the CT scan. Pancreas: No mass, inflammation or ductal dilatation. Moderate pancreatic atrophy. Spleen: Normal size. No focal lesions. Adrenals/Urinary Tract: The adrenal glands are unremarkable. The left kidney is small and demonstrates renal cortical thinning. No renal lesions or hydronephrosis. The bladder is unremarkable. Stomach/Bowel: The stomach, duodenum, small bowel and colon are grossly normal without oral contrast. No acute inflammatory changes, mass lesions or obstructive findings. Vascular/Lymphatic: Advanced atherosclerotic calcifications involving the tortuous abdominal aorta but no aneurysm. The branch vessels are patent. The major venous structures are patent. No mesenteric or retroperitoneal mass or adenopathy. Reproductive: Surgically absent. Other: No pelvic mass or free pelvic fluid collections. Moderate artifact through the lower pelvis due to bilateral hip hardware. Musculoskeletal: Severe scoliosis and associated degenerative changes. No acute bony findings or destructive bony changes. IMPRESSION: 1. Right fourth, fifth and sixth lateral rib fractures. No pneumothorax or pulmonary contusion. 2. No acute abdominal/pelvic findings, mass lesions or adenopathy. 3. Intra and extrahepatic biliary  dilatation, new since CT scan of September 2020. No obvious obstructing common bile duct stone, ampullary lesion or pancreatic head mass. Recommend correlation with liver function studies. Patient probably not a good candidate for MRCP given limited breath holding for the CT scan. 4. Advanced atherosclerotic calcifications involving the thoracic and abdominal aorta and branch vessels including the coronary arteries. Aortic Atherosclerosis (ICD10-I70.0). Electronically Signed   By: Marijo Sanes M.D.   On: 03/08/2020 11:54   CT Cervical Spine Wo Contrast  Result Date: 03/08/2020 CLINICAL DATA:  Unwitnessed fall. No loss of consciousness. EXAM: CT HEAD WITHOUT CONTRAST CT CERVICAL SPINE WITHOUT CONTRAST TECHNIQUE: Multidetector CT imaging of the head and cervical spine was performed following the standard protocol without intravenous contrast. Multiplanar CT image reconstructions of the cervical spine were also generated. COMPARISON:  February 05, 2020. FINDINGS: CT HEAD FINDINGS Brain: Mild diffuse cortical atrophy is noted. Mild chronic ischemic white matter disease is noted. Old right cerebellar infarction is noted. No mass effect or midline shift is noted. Ventricular size is within normal limits. There is no evidence of mass lesion, hemorrhage or acute infarction. Vascular: No hyperdense vessel or unexpected calcification. Skull: Normal. Negative for fracture or focal lesion. Sinuses/Orbits: No acute finding. Other: None. CT CERVICAL SPINE FINDINGS Alignment: Normal. Skull base and vertebrae: No acute fracture. No primary bone lesion or focal pathologic process. Soft tissues and spinal canal: No prevertebral fluid or swelling. No visible canal hematoma. Disc levels: Severe degenerative disc disease is noted at C4-5, C5-6 and C6-7 with anterior osteophyte formation. Upper chest: Negative. Other: None. IMPRESSION: 1. Mild diffuse cortical atrophy. Mild chronic ischemic white matter disease. No acute intracranial  abnormality seen. 2. Severe multilevel degenerative disc disease. No acute abnormality seen in the cervical spine.  Electronically Signed   By: Marijo Conception M.D.   On: 03/08/2020 11:47   CT Abdomen Pelvis W Contrast  Result Date: 03/08/2020 CLINICAL DATA:  [place] out of bed this morning. Patient complaining of right chest pain. EXAM: CT CHEST, ABDOMEN, AND PELVIS WITH CONTRAST TECHNIQUE: Multidetector CT imaging of the chest, abdomen and pelvis was performed following the standard protocol during bolus administration of intravenous contrast. CONTRAST:  57mL OMNIPAQUE IOHEXOL 300 MG/ML  SOLN COMPARISON:  08/31/2019 FINDINGS: CT CHEST FINDINGS Cardiovascular: The heart is normal in size. No pericardial effusion. There is mild tortuosity, ectasia and calcification of the thoracic aorta but no aneurysm or dissection. Three-vessel coronary artery calcifications are noted. Mediastinum/Nodes: No mediastinal or hilar mass or adenopathy or hematoma. The esophagus is grossly normal. Small scattered thyroid nodules are stable. Lungs/Pleura: No acute pulmonary findings. No pulmonary contusion, pneumothorax or pleural effusion. There is streaky bibasilar atelectasis and focal eventration of the right hemidiaphragm. No worrisome pulmonary lesions. The central tracheobronchial tree is unremarkable. Musculoskeletal: There are fourth, fifth and right sixth lateral rib fractures. These are minimally displaced. No definite left-sided rib fractures. The sternum is intact. Scoliosis and degenerative changes involving the thoracic spine but no acute fracture. Extensive soft tissue density involving both breasts. This is unchanged since the prior CT scan and I suspected some type of remote silicone injections. CT ABDOMEN PELVIS FINDINGS Hepatobiliary: No focal hepatic lesions or evidence of acute hepatic injury. No perihepatic fluid collections. Gallbladder is grossly normal. There is intra and extrahepatic biliary dilatation. This is  new since the CT scan from September 2020. The common bile duct measures a maximum of 13.5 mm in the head of the pancreas. I do not see an obvious common bile duct stone, ampullary region or pancreatic head mass to account for the dilatation. There could be a distal stricture. Recommend correlation with liver function studies. Patient probably not a good candidate for MRCP given limited breath holding for the CT scan. Pancreas: No mass, inflammation or ductal dilatation. Moderate pancreatic atrophy. Spleen: Normal size. No focal lesions. Adrenals/Urinary Tract: The adrenal glands are unremarkable. The left kidney is small and demonstrates renal cortical thinning. No renal lesions or hydronephrosis. The bladder is unremarkable. Stomach/Bowel: The stomach, duodenum, small bowel and colon are grossly normal without oral contrast. No acute inflammatory changes, mass lesions or obstructive findings. Vascular/Lymphatic: Advanced atherosclerotic calcifications involving the tortuous abdominal aorta but no aneurysm. The branch vessels are patent. The major venous structures are patent. No mesenteric or retroperitoneal mass or adenopathy. Reproductive: Surgically absent. Other: No pelvic mass or free pelvic fluid collections. Moderate artifact through the lower pelvis due to bilateral hip hardware. Musculoskeletal: Severe scoliosis and associated degenerative changes. No acute bony findings or destructive bony changes. IMPRESSION: 1. Right fourth, fifth and sixth lateral rib fractures. No pneumothorax or pulmonary contusion. 2. No acute abdominal/pelvic findings, mass lesions or adenopathy. 3. Intra and extrahepatic biliary dilatation, new since CT scan of September 2020. No obvious obstructing common bile duct stone, ampullary lesion or pancreatic head mass. Recommend correlation with liver function studies. Patient probably not a good candidate for MRCP given limited breath holding for the CT scan. 4. Advanced  atherosclerotic calcifications involving the thoracic and abdominal aorta and branch vessels including the coronary arteries. Aortic Atherosclerosis (ICD10-I70.0). Electronically Signed   By: Marijo Sanes M.D.   On: 03/08/2020 11:54      Assessment/Plan Recurrent falls from bed Fx 4th, 5th, and 6th right lateral ribs Chronic back  pain - Neurontin & oxycodone Chronic narcotic use for pain control Hx rhabdomyolysis with AKI after fall 02/05/2020   Plan:  Pt is being admitted by Medicine and will transfer to [facility].  Trauma service will follow.  She will need pain control, IS, Flutter valve, PT/OT.  Will recheck CXR in AM.  I would place her back on her chronic medicines, add Toradol as long as her renal function remains stable. Continue IV narcotics PRN for pain not relieved by her chronic pain medicines.   She will need to be tested for COVID.  I will add Toradol for another 24 hours, but defer to Medicine for now so we don't have 2 services doing pain medicines.  Dr. Barry Dienes will see her when she gets to [facility].  If labs and CXR ok tomorrow she can start on DVT prophylaxis.    Earnstine Regal [facility] Surgery 03/08/2020, 1:14 PM Please see Amion for pager number during day hours 7:00am-4:30pm

## 2020-03-08 NOTE — ED Notes (Signed)
[employer]" Case listed as designated Counselling psychologist. Pts POA

## 2020-03-09 ENCOUNTER — Observation Stay (HOSPITAL_COMMUNITY): Payer: Medicare Other

## 2020-03-09 ENCOUNTER — Encounter (HOSPITAL_COMMUNITY): Payer: Self-pay | Admitting: Internal Medicine

## 2020-03-09 DIAGNOSIS — N179 Acute kidney failure, unspecified: Secondary | ICD-10-CM | POA: Diagnosis not present

## 2020-03-09 DIAGNOSIS — Z66 Do not resuscitate: Secondary | ICD-10-CM

## 2020-03-09 DIAGNOSIS — R296 Repeated falls: Secondary | ICD-10-CM

## 2020-03-09 DIAGNOSIS — S2241XA Multiple fractures of ribs, right side, initial encounter for closed fracture: Secondary | ICD-10-CM | POA: Diagnosis not present

## 2020-03-09 LAB — CBC
HCT: 30 % — ABNORMAL LOW (ref 36.0–46.0)
Hemoglobin: 9.3 g/dL — ABNORMAL LOW (ref 12.0–15.0)
MCH: 27.1 pg (ref 26.0–34.0)
MCHC: 31 g/dL (ref 30.0–36.0)
MCV: 87.5 fL (ref 80.0–100.0)
Platelets: 251 10*3/uL (ref 150–400)
RBC: 3.43 MIL/uL — ABNORMAL LOW (ref 3.87–5.11)
RDW: 13.7 % (ref 11.5–15.5)
WBC: 7.8 10*3/uL (ref 4.0–10.5)
nRBC: 0 % (ref 0.0–0.2)

## 2020-03-09 LAB — COMPREHENSIVE METABOLIC PANEL
ALT: 15 U/L (ref 0–44)
AST: 20 U/L (ref 15–41)
Albumin: 2.8 g/dL — ABNORMAL LOW (ref 3.5–5.0)
Alkaline Phosphatase: 70 U/L (ref 38–126)
Anion gap: 8 (ref 5–15)
BUN: 28 mg/dL — ABNORMAL HIGH (ref 8–23)
CO2: 28 mmol/L (ref 22–32)
Calcium: 8.9 mg/dL (ref 8.9–10.3)
Chloride: 102 mmol/L (ref 98–111)
Creatinine, Ser: 1.31 mg/dL — ABNORMAL HIGH (ref 0.44–1.00)
GFR calc Af Amer: 43 mL/min — ABNORMAL LOW (ref >60)
GFR calc non Af Amer: 37 mL/min — ABNORMAL LOW (ref >60)
Glucose, Bld: 101 mg/dL — ABNORMAL HIGH (ref 70–99)
Potassium: 4.1 mmol/L (ref 3.5–5.1)
Sodium: 138 mmol/L (ref 135–145)
Total Bilirubin: 0.5 mg/dL (ref 0.3–1.2)
Total Protein: 5 g/dL — ABNORMAL LOW (ref 6.5–8.1)

## 2020-03-09 LAB — URINALYSIS, ROUTINE W REFLEX MICROSCOPIC
Bacteria, UA: NONE SEEN
Bilirubin Urine: NEGATIVE
Glucose, UA: NEGATIVE mg/dL
Hgb urine dipstick: NEGATIVE
Ketones, ur: NEGATIVE mg/dL
Nitrite: NEGATIVE
Protein, ur: NEGATIVE mg/dL
Specific Gravity, Urine: 1.04 — ABNORMAL HIGH (ref 1.005–1.030)
WBC, UA: >50 WBC/hpf — ABNORMAL HIGH (ref 0–5)
pH: 5 (ref 5.0–8.0)

## 2020-03-09 MED ORDER — DOCUSATE SODIUM 100 MG PO CAPS
100.0000 mg | ORAL_CAPSULE | Freq: Two times a day (BID) | ORAL | Status: DC
  Administered 2020-03-09 – 2020-03-12 (×5): 100 mg via ORAL
  Filled 2020-03-09 (×7): qty 1

## 2020-03-09 MED ORDER — ACETAMINOPHEN 325 MG PO TABS
650.0000 mg | ORAL_TABLET | Freq: Four times a day (QID) | ORAL | Status: DC
  Administered 2020-03-09 – 2020-03-12 (×13): 650 mg via ORAL
  Filled 2020-03-09 (×13): qty 2

## 2020-03-09 MED ORDER — OXYCODONE HCL 5 MG PO TABS: 5.0000 mg | ORAL_TABLET | ORAL | Status: DC | PRN

## 2020-03-09 MED ORDER — ADULT MULTIVITAMIN W/MINERALS CH
1.0000 | ORAL_TABLET | Freq: Every day | ORAL | Status: DC
  Administered 2020-03-09 – 2020-03-12 (×4): 1 via ORAL
  Filled 2020-03-09 (×4): qty 1

## 2020-03-09 MED ORDER — OXYCODONE HCL 5 MG PO TABS
10.0000 mg | ORAL_TABLET | ORAL | Status: DC | PRN
  Administered 2020-03-09 – 2020-03-10 (×2): 10 mg via ORAL
  Administered 2020-03-10 (×2): 15 mg via ORAL
  Administered 2020-03-10 (×2): 10 mg via ORAL
  Administered 2020-03-11 – 2020-03-12 (×7): 15 mg via ORAL
  Filled 2020-03-09 (×2): qty 3
  Filled 2020-03-09: qty 2
  Filled 2020-03-09 (×3): qty 3
  Filled 2020-03-09 (×2): qty 2
  Filled 2020-03-09 (×4): qty 3
  Filled 2020-03-09: qty 2

## 2020-03-09 MED ORDER — OXYCODONE HCL 5 MG PO TABS: 10.0000 mg | ORAL_TABLET | ORAL | Status: DC | PRN

## 2020-03-09 MED ORDER — ENSURE ENLIVE PO LIQD
237.0000 mL | Freq: Two times a day (BID) | ORAL | Status: DC
  Administered 2020-03-11 (×2): 237 mL via ORAL

## 2020-03-09 MED ORDER — METHOCARBAMOL 500 MG PO TABS
500.0000 mg | ORAL_TABLET | Freq: Three times a day (TID) | ORAL | Status: DC | PRN
  Administered 2020-03-09 – 2020-03-12 (×6): 500 mg via ORAL
  Filled 2020-03-09 (×6): qty 1

## 2020-03-09 NOTE — Evaluation (Addendum)
Physical Therapy Evaluation Patient Details Name: Angelica Bell MRN: [id number] DOB: [birth year] Today's Date: 03/09/2020   History of Present Illness  Pt is an 86 y/o female admitted after a fall at home in which she sustained 4-6 R lateral rib fxs. PMH including but not limited to HTN and IM nail L femur in 2018.     Clinical Impression  Pt presented supine in bed with HOB elevated, awake and willing to participate in therapy session. Prior to admission, pt reported that she was independent with all functional mobility and ADLs. Pt lives alone in a single level home with one step to enter. Her grandson is available to assist her intermittently if needed (he checks on her in the mornings and at night). At the time of evaluation, pt limited secondary to pain and weakness. Overall she required min A for all mobility with use of a RW to ambulate a very short distance within her room. Pt on RA throughout with SPO2 maintaining >90%. Pt demonstrated use of IS x5 at end of session sitting EOB. PT encouraged pt to continue to use throughout the day. Based on her current mobility status and lack of 24/7 supervision/assistance at home, currently recommending that pt d/c to SNF for short-term rehab prior to returning home. Pt would continue to benefit from skilled physical therapy services at this time while admitted and after d/c to address the below listed limitations in order to improve overall safety and independence with functional mobility.     Follow Up Recommendations SNF    Equipment Recommendations  None recommended by PT    Recommendations for Other Services       Precautions / Restrictions Precautions Precautions: Fall Restrictions Weight Bearing Restrictions: No      Mobility  Bed Mobility Overal bed mobility: Needs Assistance Bed Mobility: Rolling;Sidelying to Sit;Sit to Sidelying Rolling: Min guard Sidelying to sit: Min guard     Sit to sidelying: Min assist General bed  mobility comments: use of bed rails, assistance needed to return bilateral LEs onto bed  Transfers Overall transfer level: Needs assistance Equipment used: 2 person hand held assist Transfers: Sit to/from Stand Sit to Stand: Min assist;+2 safety/equipment         General transfer comment: increased time, antalgic with movement and transition into standing from EOB  Ambulation/Gait Ambulation/Gait assistance: Min assist;+2 safety/equipment;+2 physical assistance Gait Distance (Feet): 15 Feet Assistive device: 2 person hand held assist;Rolling walker (2 wheeled) Gait Pattern/deviations: Step-to pattern;Step-through pattern;Decreased step length - right;Decreased step length - left;Decreased stride length;Shuffle;Narrow base of support Gait velocity: decreased   General Gait Details: pt initially ambulating ~5' with 2HHA with very short, shuffling steps; pt then using a RW to ambulate ~10' with min A for safety and stability as well as management of the RW  Stairs            Wheelchair Mobility    Modified Rankin (Stroke Patients Only)       Balance Overall balance assessment: History of Falls;Needs assistance Sitting-balance support: Feet supported;Bilateral upper extremity supported;Single extremity supported Sitting balance-Leahy Scale: Poor     Standing balance support: During functional activity;Bilateral upper extremity supported;Single extremity supported Standing balance-Leahy Scale: Poor                               Pertinent Vitals/Pain Pain Assessment: Faces Faces Pain Scale: Hurts whole lot Pain Location: R lateral trunk, ribcage Pain Descriptors /  Indicators: Grimacing;Guarding Pain Intervention(s): Monitored during session;Repositioned    Home Living Family/patient expects to be discharged to:: Private residence Living Arrangements: Alone Available Help at Discharge: Family;Available PRN/intermittently Type of Home: House Home Access:  Stairs to enter Entrance Stairs-Rails: None Entrance Stairs-Number of Steps: 1 Home Layout: One level Home Equipment: None      Prior Function Level of Independence: Independent               Hand Dominance        Extremity/Trunk Assessment   Upper Extremity Assessment Upper Extremity Assessment: Generalized weakness    Lower Extremity Assessment Lower Extremity Assessment: Generalized weakness    Cervical / Trunk Assessment Cervical / Trunk Assessment: Kyphotic  Communication   Communication: HOH  Cognition Arousal/Alertness: Awake/alert Behavior During Therapy: WFL for tasks assessed/performed;Anxious Overall Cognitive Status: Impaired/Different from baseline Area of Impairment: Memory;Safety/judgement;Problem solving                     Memory: Decreased recall of precautions   Safety/Judgement: Decreased awareness of deficits;Decreased awareness of safety   Problem Solving: Difficulty sequencing;Requires verbal cues;Requires tactile cues        General Comments      Exercises     Assessment/Plan    PT Assessment Patient needs continued PT services  PT Problem List Decreased strength;Decreased range of motion;Decreased activity tolerance;Decreased mobility;Decreased balance;Decreased coordination;Decreased knowledge of use of DME;Decreased safety awareness;Decreased knowledge of precautions;Pain       PT Treatment Interventions DME instruction;Gait training;Stair training;Functional mobility training;Therapeutic activities;Therapeutic exercise;Balance training;Neuromuscular re-education;Patient/family education    PT Goals (Current goals can be found in the Care Plan section)  Acute Rehab PT Goals Patient Stated Goal: decrease pain PT Goal Formulation: With patient Time For Goal Achievement: 03/23/20 Potential to Achieve Goals: Fair    Frequency Min 2X/week   Barriers to discharge        Co-evaluation               AM-PAC  PT "6 Clicks" Mobility  Outcome Measure Help needed turning from your back to your side while in a flat bed without using bedrails?: None Help needed moving from lying on your back to sitting on the side of a flat bed without using bedrails?: None Help needed moving to and from a bed to a chair (including a wheelchair)?: A Little Help needed standing up from a chair using your arms (e.g., wheelchair or bedside chair)?: A Little Help needed to walk in hospital room?: A Little Help needed climbing 3-5 steps with a railing? : A Lot 6 Click Score: 19    End of Session   Activity Tolerance: Patient limited by pain Patient left: in bed;with call bell/phone within reach;with bed alarm set;with SCD's reapplied Nurse Communication: Mobility status PT Visit Diagnosis: Other abnormalities of gait and mobility (R26.89);Pain Pain - Right/Left: Right Pain - part of body: (ribs)    Time: [id number] PT Time Calculation (min) (ACUTE ONLY): 21 min   Charges:   PT Evaluation $PT Eval Moderate Complexity: 1 Mod          Eduard Clos, PT, DPT  Acute Rehabilitation Services Pager [phone] Office [place] 03/09/2020, 4:03 PM

## 2020-03-09 NOTE — Progress Notes (Signed)
Progress Note    Angelica Bell  [id number] DOB: 22-Oct-1932  DOA: 03/08/2020 PCP: Heywood Bene, PA-C    Brief Narrative:   Chief complaint: fall  Medical records reviewed and are as summarized below:  Angelica Bell is an 85 y.o. female with a past medical history that includes chronic back pain, chronic pain disorder with narcotic management, hypertension, osteoporosis, pulmonary embolism, pneumonia, frequent falls presented to the emergency department 3/10 after falling out of bed at home.  She developed right sided pleuritic chest pain.  Work-up in the emergency department reveals fracture fourth fifth and sixth right lateral ribs.  Transferred to [facility] so trauma can follow.  Assessment/Plan:   Principal Problem:   Multiple rib fractures Active Problems:   Essential hypertension   AKI (acute kidney injury) ([place])   Frequent falls   ANXIETY DEPRESSION   Leukocytosis   Normocytic anemia   Hyperlipidemia   DNR (do not resuscitate)   Common bile duct dilatation  #1.  Multiple rib fractures.  Chest x-ray shows an acute right lateral fourth and fifth rib fracture no associated pneumothorax or effusion.  Repeat chest x-ray this morning reveals redemonstration lateral right mid rib fractures with no pneumothorax stable mid left basilar atelectasis.  My exam patient is ambulating in room performing a.m. care with assistance.  No shortness of breath and denied pain at that time but reported "I can feel it coming on".  Oxygen saturation level 93% on room air.  Evaluated by trauma who recommended changing pain management oxycodone as needed with scheduled Tylenol and added Robaxin as needed. -Pain control as noted above -Mobilize as much as possible -Incentive spirometry -Monitor oxygen saturation level PT/OT eval for recs  #2.  Hypertension controlled.  Home medications include Norvasc, Lasix -Continue to hold these meds for now as blood pressure low  end of normal. -Monitor closely and resume home meds as indicated  #3.  Common bile duct dilatation per CT. ultrasound reveals technically limited exam, of limited diagnostic utility. Patient did not tolerate exam due to acute right rib fractures.Biliary ductal dilatation on CT is not well-visualized or further evaluated due to patient condition. Probable gallstones without gallbladder inflammation. Recommend follow-up exam after resolution of acute event when patient is better able to tolerate probe pressure. -OP follow up  #4.  Anemia. Hg 9.3 this am down from 11.5 on admission. Likely dilutional.  Chart review indicates this is within her range.  No sign symptoms of active bleeding. -Monitor  #5.  Anxiety/depression.  Appears stable at baseline. -Continue home Lexapro  #6.  Leukocytosis.  WBC is 14.4 on admission.  This morning 7.8.  Is afebrile nontoxic-appearing.  Urinalysis does have WBCs and leukocytes. No bacteria. Patient denies dysuria.  -monitor  #7. Frequent falls.  Patient has had 3 or 4 falls this year so far.  She is quite thin and frail. And take opiates for chronic pain.  She lives at home by herself with a grandson who checks on her twice a day.  She falls out of bed at home and when in the hospital.  -PT eval would likely benefit from placement but doubt she would agree -OT eval  #8. [place]ki. Creatinine up to 1.3 today. May be related to dye for CT.  -hold nephrotoxins -intake and output -recheck in am    Family Communication/Anticipated D/C date and plan/Code Status   DVT prophylaxis: Lovenox ordered. Code Status: Full Code.  Family Communication: patent  Disposition  Plan: likely home   Medical Consultants:    [facility]    Anti-Infectives:    None  Subjective:   Ambulating in room with walker and minimal assist.  Performing a.m. care with minimal assist.  Denies pain.  No shortness of breath noted  Objective:    Vitals:   03/08/20 1400  03/08/20 1517 03/08/20 2016 03/09/20 0643  BP: 126/60 (!) 120/58 (!) 127/56 (!) 110/53  Pulse: 64 (!) 58 (!) 57 63  Resp:  20 15 20   Temp:  [id number] F (36.8 C) (!) 97.5 F (36.4 C) (!) 97.5 F (36.4 C)  TempSrc:  Oral Oral Oral  SpO2: 98% 100% 97% 93%  Weight:        Intake/Output Summary (Last 24 hours) at 03/09/2020 1336 Last data filed at 03/09/2020 0900 Gross per 24 hour  Intake 1370.65 ml  Output 300 ml  Net 1070.65 ml   Filed Weights   03/08/20 0751  Weight: 47.6 kg    Exam: General: Awake alert ambulating in room with walker thin and frail appearing CV: Regular rate and rhythm no murmur gallop or rub Respiratory: No increased work of breathing respirations slightly shallow breath sounds somewhat distant I hear no wheezes no crackles Abdomen: Nondistended soft positive bowel sounds throughout nontender to palpation no guarding or rebounding Musculoskeletal: Joints without swelling/erythema full range of motion Neuro: Awake alert oriented x3 speech clear facial symmetry cranial nerves II through XII grossly intact  Data Reviewed:   I have personally reviewed following labs and imaging studies:  Labs: Labs show the following:   Basic Metabolic Panel: Recent Labs  Lab 03/08/20 0816 03/09/20 0227  NA 137 138  K 3.9 4.1  CL 99 102  CO2 30 28  GLUCOSE 128* 101*  BUN 26* 28*  CREATININE 0.84 1.31*  CALCIUM 9.0 8.9  MG 2.0  --   PHOS 4.3  --    GFR Estimated Creatinine Clearance: 22.1 mL/min (A) (by C-G formula based on SCr of 1.31 mg/dL (H)). Liver Function Tests: Recent Labs  Lab 03/08/20 0816 03/09/20 0227  AST 32 20  ALT 19 15  ALKPHOS 86 70  BILITOT 0.4 0.5  PROT 6.4* 5.0*  ALBUMIN 3.7 2.8*   No results for input(s): LIPASE, AMYLASE in the last 168 hours. No results for input(s): AMMONIA in the last 168 hours. Coagulation profile No results for input(s): INR, PROTIME in the last 168 hours.  CBC: Recent Labs  Lab 03/08/20 0816 03/09/20 0227    WBC 14.4* 7.8  HGB 11.5* 9.3*  HCT 38.2 30.0*  MCV 90.5 87.5  PLT 309 251   Cardiac Enzymes: Recent Labs  Lab 03/08/20 0816  CKTOTAL 197   BNP (last 3 results) No results for input(s): PROBNP in the last 8760 hours. CBG: No results for input(s): GLUCAP in the last 168 hours. D-Dimer: No results for input(s): DDIMER in the last 72 hours. Hgb A1c: No results for input(s): HGBA1C in the last 72 hours. Lipid Profile: No results for input(s): CHOL, HDL, LDLCALC, TRIG, CHOLHDL, LDLDIRECT in the last 72 hours. Thyroid function studies: No results for input(s): TSH, T4TOTAL, T3FREE, THYROIDAB in the last 72 hours.  Invalid input(s): FREET3 Anemia work up: No results for input(s): VITAMINB12, FOLATE, FERRITIN, TIBC, IRON, RETICCTPCT in the last 72 hours. Sepsis Labs: Recent Labs  Lab 03/08/20 0816 03/09/20 0227  WBC 14.4* 7.8    Microbiology Recent Results (from the past 240 hour(s))  SARS CORONAVIRUS 2 (TAT 6-24 HRS)  Nasopharyngeal Nasopharyngeal Swab     Status: None   Collection Time: 03/08/20  9:27 AM   Specimen: Nasopharyngeal Swab  Result Value Ref Range Status   SARS Coronavirus 2 NEGATIVE NEGATIVE Final    Comment: (NOTE) SARS-CoV-2 target nucleic acids are NOT DETECTED. The SARS-CoV-2 RNA is generally detectable in upper and lower respiratory specimens during the acute phase of infection. Negative results do not preclude SARS-CoV-2 infection, do not rule out co-infections with other pathogens, and should not be used as the sole basis for treatment or other patient management decisions. Negative results must be combined with clinical observations, patient history, and epidemiological information. The expected result is Negative. Fact Sheet for Patients: SugarRoll.be Fact Sheet for Healthcare Providers: https://www.woods-mathews.com/ This test is not yet approved or cleared by the [place] FDA and  has been  authorized for detection and/or diagnosis of SARS-CoV-2 by FDA under an Emergency Use Authorization (EUA). This EUA will remain  in effect (meaning this test can be used) for the duration of the COVID-19 declaration under Section 56 4(b)(1) of the Act, 21 U.S.C. section 360bbb-3(b)(1), unless the authorization is terminated or revoked sooner. Performed at [facility], [place] [address], [place], [place] [address]     Procedures and diagnostic studies:  DG Chest 2 View  Result Date: 03/09/2020 CLINICAL DATA:  Recent fall with right fourth through sixth rib fractures. EXAM: CHEST - 2 VIEW COMPARISON:  Chest radiograph from one day prior. FINDINGS: Stable cardiomediastinal silhouette with top-normal heart size. No pneumothorax. No pleural effusion. No pulmonary edema. Mild left basilar atelectasis is unchanged. Redemonstration lateral right mid rib fractures. IMPRESSION: 1. No pneumothorax. 2. Stable mild left basilar atelectasis. Electronically Signed   By: Ilona Sorrel M.D.   On: 03/09/2020 09:44   DG Chest 2 View  Result Date: 03/08/2020 CLINICAL DATA:  Right rib pain, fall EXAM: CHEST - 2 VIEW COMPARISON:  02/05/2020 FINDINGS: Hiatal hernia, stable. Low lung volumes. Stable bibasilar opacities could reflect scarring or atelectasis. Stable bronchitic changes. Heart is normal size. Old healed left humeral neck fracture. Fractures through the lateral right 4th and 5th ribs, new since prior study. No effusion or pneumothorax. IMPRESSION: Acute right lateral 4th and 5th rib fractures. No associated effusion or pneumothorax. Low lung volumes.  Bibasilar scarring or atelectasis. Stable large hiatal hernia. Electronically Signed   By: Rolm Baptise M.D.   On: 03/08/2020 09:06   CT Head Wo Contrast  Result Date: 03/08/2020 CLINICAL DATA:  Unwitnessed fall. No loss of consciousness. EXAM: CT HEAD WITHOUT CONTRAST CT CERVICAL SPINE WITHOUT CONTRAST TECHNIQUE: Multidetector CT imaging of the head and  cervical spine was performed following the standard protocol without intravenous contrast. Multiplanar CT image reconstructions of the cervical spine were also generated. COMPARISON:  February 05, 2020. FINDINGS: CT HEAD FINDINGS Brain: Mild diffuse cortical atrophy is noted. Mild chronic ischemic white matter disease is noted. Old right cerebellar infarction is noted. No mass effect or midline shift is noted. Ventricular size is within normal limits. There is no evidence of mass lesion, hemorrhage or acute infarction. Vascular: No hyperdense vessel or unexpected calcification. Skull: Normal. Negative for fracture or focal lesion. Sinuses/Orbits: No acute finding. Other: None. CT CERVICAL SPINE FINDINGS Alignment: Normal. Skull base and vertebrae: No acute fracture. No primary bone lesion or focal pathologic process. Soft tissues and spinal canal: No prevertebral fluid or swelling. No visible canal hematoma. Disc levels: Severe degenerative disc disease is noted at C4-5, C5-6 and C6-7 with  anterior osteophyte formation. Upper chest: Negative. Other: None. IMPRESSION: 1. Mild diffuse cortical atrophy. Mild chronic ischemic white matter disease. No acute intracranial abnormality seen. 2. Severe multilevel degenerative disc disease. No acute abnormality seen in the cervical spine. Electronically Signed   By: Marijo Conception M.D.   On: 03/08/2020 11:47   CT Chest W Contrast  Result Date: 03/08/2020 CLINICAL DATA:  [place] out of bed this morning. Patient complaining of right chest pain. EXAM: CT CHEST, ABDOMEN, AND PELVIS WITH CONTRAST TECHNIQUE: Multidetector CT imaging of the chest, abdomen and pelvis was performed following the standard protocol during bolus administration of intravenous contrast. CONTRAST:  102mL OMNIPAQUE IOHEXOL 300 MG/ML  SOLN COMPARISON:  08/31/2019 FINDINGS: CT CHEST FINDINGS Cardiovascular: The heart is normal in size. No pericardial effusion. There is mild tortuosity, ectasia and calcification  of the thoracic aorta but no aneurysm or dissection. Three-vessel coronary artery calcifications are noted. Mediastinum/Nodes: No mediastinal or hilar mass or adenopathy or hematoma. The esophagus is grossly normal. Small scattered thyroid nodules are stable. Lungs/Pleura: No acute pulmonary findings. No pulmonary contusion, pneumothorax or pleural effusion. There is streaky bibasilar atelectasis and focal eventration of the right hemidiaphragm. No worrisome pulmonary lesions. The central tracheobronchial tree is unremarkable. Musculoskeletal: There are fourth, fifth and right sixth lateral rib fractures. These are minimally displaced. No definite left-sided rib fractures. The sternum is intact. Scoliosis and degenerative changes involving the thoracic spine but no acute fracture. Extensive soft tissue density involving both breasts. This is unchanged since the prior CT scan and I suspected some type of remote silicone injections. CT ABDOMEN PELVIS FINDINGS Hepatobiliary: No focal hepatic lesions or evidence of acute hepatic injury. No perihepatic fluid collections. Gallbladder is grossly normal. There is intra and extrahepatic biliary dilatation. This is new since the CT scan from September 2020. The common bile duct measures a maximum of 13.5 mm in the head of the pancreas. I do not see an obvious common bile duct stone, ampullary region or pancreatic head mass to account for the dilatation. There could be a distal stricture. Recommend correlation with liver function studies. Patient probably not a good candidate for MRCP given limited breath holding for the CT scan. Pancreas: No mass, inflammation or ductal dilatation. Moderate pancreatic atrophy. Spleen: Normal size. No focal lesions. Adrenals/Urinary Tract: The adrenal glands are unremarkable. The left kidney is small and demonstrates renal cortical thinning. No renal lesions or hydronephrosis. The bladder is unremarkable. Stomach/Bowel: The stomach, duodenum,  small bowel and colon are grossly normal without oral contrast. No acute inflammatory changes, mass lesions or obstructive findings. Vascular/Lymphatic: Advanced atherosclerotic calcifications involving the tortuous abdominal aorta but no aneurysm. The branch vessels are patent. The major venous structures are patent. No mesenteric or retroperitoneal mass or adenopathy. Reproductive: Surgically absent. Other: No pelvic mass or free pelvic fluid collections. Moderate artifact through the lower pelvis due to bilateral hip hardware. Musculoskeletal: Severe scoliosis and associated degenerative changes. No acute bony findings or destructive bony changes. IMPRESSION: 1. Right fourth, fifth and sixth lateral rib fractures. No pneumothorax or pulmonary contusion. 2. No acute abdominal/pelvic findings, mass lesions or adenopathy. 3. Intra and extrahepatic biliary dilatation, new since CT scan of September 2020. No obvious obstructing common bile duct stone, ampullary lesion or pancreatic head mass. Recommend correlation with liver function studies. Patient probably not a good candidate for MRCP given limited breath holding for the CT scan. 4. Advanced atherosclerotic calcifications involving the thoracic and abdominal aorta and branch vessels including the coronary  arteries. Aortic Atherosclerosis (ICD10-I70.0). Electronically Signed   By: Marijo Sanes M.D.   On: 03/08/2020 11:54   CT Cervical Spine Wo Contrast  Result Date: 03/08/2020 CLINICAL DATA:  Unwitnessed fall. No loss of consciousness. EXAM: CT HEAD WITHOUT CONTRAST CT CERVICAL SPINE WITHOUT CONTRAST TECHNIQUE: Multidetector CT imaging of the head and cervical spine was performed following the standard protocol without intravenous contrast. Multiplanar CT image reconstructions of the cervical spine were also generated. COMPARISON:  February 05, 2020. FINDINGS: CT HEAD FINDINGS Brain: Mild diffuse cortical atrophy is noted. Mild chronic ischemic white matter  disease is noted. Old right cerebellar infarction is noted. No mass effect or midline shift is noted. Ventricular size is within normal limits. There is no evidence of mass lesion, hemorrhage or acute infarction. Vascular: No hyperdense vessel or unexpected calcification. Skull: Normal. Negative for fracture or focal lesion. Sinuses/Orbits: No acute finding. Other: None. CT CERVICAL SPINE FINDINGS Alignment: Normal. Skull base and vertebrae: No acute fracture. No primary bone lesion or focal pathologic process. Soft tissues and spinal canal: No prevertebral fluid or swelling. No visible canal hematoma. Disc levels: Severe degenerative disc disease is noted at C4-5, C5-6 and C6-7 with anterior osteophyte formation. Upper chest: Negative. Other: None. IMPRESSION: 1. Mild diffuse cortical atrophy. Mild chronic ischemic white matter disease. No acute intracranial abnormality seen. 2. Severe multilevel degenerative disc disease. No acute abnormality seen in the cervical spine. Electronically Signed   By: Marijo Conception M.D.   On: 03/08/2020 11:47   CT Abdomen Pelvis W Contrast  Result Date: 03/08/2020 CLINICAL DATA:  [place] out of bed this morning. Patient complaining of right chest pain. EXAM: CT CHEST, ABDOMEN, AND PELVIS WITH CONTRAST TECHNIQUE: Multidetector CT imaging of the chest, abdomen and pelvis was performed following the standard protocol during bolus administration of intravenous contrast. CONTRAST:  81mL OMNIPAQUE IOHEXOL 300 MG/ML  SOLN COMPARISON:  08/31/2019 FINDINGS: CT CHEST FINDINGS Cardiovascular: The heart is normal in size. No pericardial effusion. There is mild tortuosity, ectasia and calcification of the thoracic aorta but no aneurysm or dissection. Three-vessel coronary artery calcifications are noted. Mediastinum/Nodes: No mediastinal or hilar mass or adenopathy or hematoma. The esophagus is grossly normal. Small scattered thyroid nodules are stable. Lungs/Pleura: No acute pulmonary  findings. No pulmonary contusion, pneumothorax or pleural effusion. There is streaky bibasilar atelectasis and focal eventration of the right hemidiaphragm. No worrisome pulmonary lesions. The central tracheobronchial tree is unremarkable. Musculoskeletal: There are fourth, fifth and right sixth lateral rib fractures. These are minimally displaced. No definite left-sided rib fractures. The sternum is intact. Scoliosis and degenerative changes involving the thoracic spine but no acute fracture. Extensive soft tissue density involving both breasts. This is unchanged since the prior CT scan and I suspected some type of remote silicone injections. CT ABDOMEN PELVIS FINDINGS Hepatobiliary: No focal hepatic lesions or evidence of acute hepatic injury. No perihepatic fluid collections. Gallbladder is grossly normal. There is intra and extrahepatic biliary dilatation. This is new since the CT scan from September 2020. The common bile duct measures a maximum of 13.5 mm in the head of the pancreas. I do not see an obvious common bile duct stone, ampullary region or pancreatic head mass to account for the dilatation. There could be a distal stricture. Recommend correlation with liver function studies. Patient probably not a good candidate for MRCP given limited breath holding for the CT scan. Pancreas: No mass, inflammation or ductal dilatation. Moderate pancreatic atrophy. Spleen: Normal size. No focal lesions. Adrenals/Urinary  Tract: The adrenal glands are unremarkable. The left kidney is small and demonstrates renal cortical thinning. No renal lesions or hydronephrosis. The bladder is unremarkable. Stomach/Bowel: The stomach, duodenum, small bowel and colon are grossly normal without oral contrast. No acute inflammatory changes, mass lesions or obstructive findings. Vascular/Lymphatic: Advanced atherosclerotic calcifications involving the tortuous abdominal aorta but no aneurysm. The branch vessels are patent. The major  venous structures are patent. No mesenteric or retroperitoneal mass or adenopathy. Reproductive: Surgically absent. Other: No pelvic mass or free pelvic fluid collections. Moderate artifact through the lower pelvis due to bilateral hip hardware. Musculoskeletal: Severe scoliosis and associated degenerative changes. No acute bony findings or destructive bony changes. IMPRESSION: 1. Right fourth, fifth and sixth lateral rib fractures. No pneumothorax or pulmonary contusion. 2. No acute abdominal/pelvic findings, mass lesions or adenopathy. 3. Intra and extrahepatic biliary dilatation, new since CT scan of September 2020. No obvious obstructing common bile duct stone, ampullary lesion or pancreatic head mass. Recommend correlation with liver function studies. Patient probably not a good candidate for MRCP given limited breath holding for the CT scan. 4. Advanced atherosclerotic calcifications involving the thoracic and abdominal aorta and branch vessels including the coronary arteries. Aortic Atherosclerosis (ICD10-I70.0). Electronically Signed   By: Marijo Sanes M.D.   On: 03/08/2020 11:54   US Abdomen Limited RUQ  Result Date: 03/08/2020 CLINICAL DATA:  Dilated bile duct. EXAM: ULTRASOUND ABDOMEN LIMITED RIGHT UPPER QUADRANT COMPARISON:  CT chest abdomen pelvis earlier this day. Patient apparently fell out of bed. Right rib fractures. FINDINGS: Patient had difficulty tolerating the exam due to pain. Examination is of limited diagnostic utility. Gallbladder: Appears physiologically distended. Probable intraluminal gallstone. No sonographic Murphy sign noted by sonographer. Common bile duct: Not demonstrated due to pain and motion. Liver: Limited assessment of liver parenchyma. No obvious focal lesion. Portal vein could not be evaluated due to patient's pain. Other: No right upper quadrant free fluid. IMPRESSION: 1. Technically limited exam, of limited diagnostic utility. Patient did not tolerate exam due to acute  right rib fractures. 2. Biliary ductal dilatation on CT is not well-visualized or further evaluated due to patient condition. 3. Probable gallstones without gallbladder inflammation. 4. Recommend follow-up exam after resolution of acute event when patient is better able to tolerate probe pressure. Electronically Signed   By: Keith Rake M.D.   On: 03/08/2020 14:25    Medications:   . acetaminophen  650 mg Oral Q6H  . aspirin EC  81 mg Oral Daily  . docusate sodium  100 mg Oral BID  . escitalopram  20 mg Oral Daily  . ferrous sulfate  325 mg Oral Daily  . gabapentin  100 mg Oral TID  . mirabegron ER  25 mg Oral Daily  . pantoprazole  40 mg Oral Daily   Continuous Infusions:   LOS: 0 days   Radene Gunning NP [facility]   How to contact the [facility] Attending or Consulting provider [place] or covering provider during after hours [place], for this patient?  1. Check the care team in [facility] and look for a) attending/consulting [facility] provider listed and b) the [facility] team listed 2. Log into [email or web address] and use [place]'s universal password to access. If you do not have the password, please contact the hospital operator. 3. Locate the [facility] provider you are looking for under [facility] and page to a number that you can be directly reached. 4. If you still have difficulty reaching the provider, please page  the [facility] (Director on Call) for the Hospitalists listed on amion for assistance.  03/09/2020, 1:36 PM

## 2020-03-09 NOTE — TOC Initial Note (Signed)
Transition of Care [facility]) - Initial/Assessment Note    Patient Details  Name: Angelica Bell MRN: [id number] Date of Birth: 03-25-1932  Transition of Care [facility]) CM/SW Contact:    Alexander Mt, LCSW Phone Number: 03/09/2020, 2:07 PM  Clinical Narrative:                 CSW spoke with pt at bedside. Introduced self, role, reason for visit. Pt from home, she states she lives alone- has multiple pieces of DME including wheelchair, walkers (x3- including 2 walkers and rollator). Her bathroom is close to her bedroom. She states that [name] comes by to check on her but she usually gets around herself. Pt wants to go home, we discussed that therapy has to evaluate if that is safe for her given that she doesn't have someone with her around the clock.   CSW received permission to send out referral to SNFs pending therapy evaluation for review. Pt expresses preference for SNF at [facility] if possible, she inquires about this writers preference for facility, explained that due to Medicare guidelines I cannot give recommendations but I can send referrals.   Pt declines for this writer to give pt grandson a call at this time, she will speak with him tonight.   Per MD pt stabilizing, await therapies for disposition clarity and to complete auth.   Expected Discharge Plan: Skilled Nursing Facility(vs. home with home health) Barriers to Discharge: Continued Medical Work up, Ship broker   Patient Goals and CMS Choice Patient states their goals for this hospitalization and ongoing recovery are:: to go home if able, if recommended open to SNF CMS Medicare.gov Compare Post Acute Care list provided to:: Patient Choice offered to / list presented to : Patient  Expected Discharge Plan and Services Expected Discharge Plan: Skilled Nursing Facility(vs. home with home health) In-house Referral: Clinical Social Work Discharge Planning Services: CM Consult Living arrangements for the  past 2 months: [place]    Prior Living Arrangements/Services Living arrangements for the past 2 months: Single Family Home Lives with:: Self Patient language and need for interpreter reviewed:: Yes(no needs) Do you feel safe going back to the place where you live?: Yes      Need for Family Participation in Patient Care: Yes (Comment)(assistance as needed) Care giver support system in place?: Yes (comment)(pt grandson checks on her) Current home services: DME Criminal Activity/Legal Involvement Pertinent to Current Situation/Hospitalization: No - Comment as needed  Activities of Daily Living Home Assistive Devices/Equipment: Environmental consultant (specify type), Wheelchair, Grab bars around toilet, Eyeglasses(front wheeled walker) ADL Screening (condition at time of admission) Patient's cognitive ability adequate to safely complete daily activities?: Yes Is the patient deaf or have difficulty hearing?: No Does the patient have difficulty seeing, even when wearing glasses/contacts?: No Does the patient have difficulty concentrating, remembering, or making decisions?: No Patient able to express need for assistance with ADLs?: Yes Does the patient have difficulty dressing or bathing?: Yes Independently performs ADLs?: No Communication: Independent Dressing (OT): Needs assistance Is this a change from baseline?: Change from baseline, expected to last >3 days Grooming: Needs assistance Is this a change from baseline?: Change from baseline, expected to last >3 days Feeding: Needs assistance Is this a change from baseline?: Change from baseline, expected to last >3 days Bathing: Needs assistance Is this a change from baseline?: Change from baseline, expected to last >3 days Toileting: Needs assistance Is this a change from baseline?: Change from baseline, expected to last >  3days In/Out Bed: Needs assistance Is this a change from baseline?: Change from baseline, expected to last >3 days Walks  in Home: Needs assistance Does the patient have difficulty walking or climbing stairs?: Yes Weakness of Legs: Both Weakness of Arms/Hands: Both  Permission Sought/Granted Permission sought to share information with : Family Supports Permission granted to share information with : Yes, Verbal Permission Granted(pt wants to talk to him first)  Share Information with NAME: [name] Case  Permission granted to share info w AGENCY: SNFs- prefers [facility]  Permission granted to share info w Relationship: grandson  Permission granted to share info w Contact Information: [phone]  Emotional Assessment Appearance:: Appears stated age Attitude/Demeanor/Rapport: Engaged, Self-Confident Affect (typically observed): Accepting, Adaptable, Appropriate Orientation: : Oriented to Self, Oriented to Place, Oriented to  Time, Oriented to Situation Alcohol / Substance Use: Not Applicable Psych Involvement: (n/a)  Admission diagnosis:  Multiple rib fractures [S22.49XA] Dilated bile duct [K83.8] Fx three ribs-open, right, initial encounter [S22.41XB] Closed fracture of multiple ribs, unspecified laterality, initial encounter [S22.49XA] Patient Active Problem List   Diagnosis Date Noted  . DNR (do not resuscitate) 03/09/2020  . Frequent falls 03/09/2020  . Multiple rib fractures 03/08/2020  . Common bile duct dilatation 03/08/2020  . Leukocytosis 03/08/2020  . Normocytic anemia 03/08/2020  . Hypomagnesemia 02/08/2020  . Fall 02/06/2020  . AKI (acute kidney injury) ([place]) 02/05/2020  . UTI (urinary tract infection) 09/01/2019  . Rhabdomyolysis 09/01/2019  . Shock ([place]) 08/31/2019  . Acute cystitis 08/03/2019  . Confusion and disorientation 08/03/2019  . Cerebrovascular accident (CVA) ([place])   . ARF (acute renal failure) ([place]) 07/26/2019  . Hyponatremia 07/26/2019  . Abnormal liver function 07/26/2019  . Peripheral neuropathy   . Drug overdose   . Polypharmacy   . Altered mental status 10/25/2017   . Closed displaced intertrochanteric fracture of left femur ([place]) 02/16/2017  . Hip fracture, right ([place]) 12/25/2015  . Chronic pain 12/24/2015  . Fracture of femoral neck, right, closed ([place]) 12/24/2015  . Essential hypertension 12/24/2015  . Community acquired pneumonia 04/15/2013  . Hypokalemia 04/15/2013  . Labial cyst 02/08/2012  . POSTMENOPAUSAL SYNDROME 11/28/2009  . PERIPHERAL NEUROPATHY, LOWER EXTREMITY, RIGHT 02/22/2009  . INSOMNIA-SLEEP DISORDER-UNSPEC 10/12/2008  . Lumbago 08/02/2008  . 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 ANXIETY DEPRESSION 12/16/2007  . Hyperlipidemia 07/08/2007  . Osteoarthritis 07/08/2007  . Osteoporosis 07/08/2007  . SCOLIOSIS NEC 07/08/2007   PCP:  Heywood Bene, PA-C Pharmacy:   [facility], [place] - [phone] [place] [address] Phone: [phone] Fax: [phone] - [place], [place] - [address] [place] [address] [place] [address] Phone: [phone] Fax: [phone]  Readmission Risk Interventions No flowsheet data found.

## 2020-03-09 NOTE — Progress Notes (Signed)
[facility] Progress Note     Subjective: CC-  Sitting up in bed washing up. Pain from rib fractures ok when sitting still, but states that she had a lot of pain riding in wheelchair to xray this morning. Had some pain over night as well, pain medication helps. Denies SOB or cough. Pulling 600 on IS. O2 sats stable on room air.  Objective: Vital signs in last 24 hours: Temp:  [97.5 F (36.4 C)-98.2 F (36.8 C)] 97.5 F (36.4 C) (03/11 0643) Pulse Rate:  [57-67] 63 (03/11 0643) Resp:  [12-20] 20 (03/11 0643) BP: (101-128)/(45-60) 110/53 (03/11 0643) SpO2:  [93 %-100 %] 93 % (03/11 0643) Last BM Date: 03/07/20  Intake/Output from previous day: 03/10 0701 - 03/11 0700 In: 1130.7 [P.O.:540; I.V.:590.7] Out: 175 [Urine:175] Intake/Output this shift: No intake/output data recorded.  PE: Gen:  Alert, NAD, pleasant HEENT: EOM's intact, pupils equal and round Card:  RRR, no M/G/R heard Pulm:  CTAB, no W/R/R, rate and effort normal Abd: Soft, NT/ND, +BS, no HSM Ext:  no BUE/BLE edema, calves soft and nontender Psych: A&Ox4  Skin: no rashes noted, warm and dry  Lab Results:  Recent Labs    03/08/20 0816 03/09/20 0227  WBC 14.4* 7.8  HGB 11.5* 9.3*  HCT 38.2 30.0*  PLT 309 251   BMET Recent Labs    03/08/20 0816 03/09/20 0227  NA 137 138  K 3.9 4.1  CL 99 102  CO2 30 28  GLUCOSE 128* 101*  BUN 26* 28*  CREATININE 0.84 1.31*  CALCIUM 9.0 8.9   PT/INR No results for input(s): LABPROT, INR in the last 72 hours. CMP     Component Value Date/Time   NA 138 03/09/2020 0227   NA 138 02/24/2017 0000   K 4.1 03/09/2020 0227   CL 102 03/09/2020 0227   CO2 28 03/09/2020 0227   GLUCOSE 101 (H) 03/09/2020 0227   BUN 28 (H) 03/09/2020 0227   BUN 15 02/24/2017 0000   CREATININE 1.31 (H) 03/09/2020 0227   CALCIUM 8.9 03/09/2020 0227   PROT 5.0 (L) 03/09/2020 0227   ALBUMIN 2.8 (L) 03/09/2020 0227   AST 20 03/09/2020 0227   ALT 15 03/09/2020 0227    ALKPHOS 70 03/09/2020 0227   BILITOT 0.5 03/09/2020 0227   GFRNONAA 37 (L) 03/09/2020 0227   GFRAA 43 (L) 03/09/2020 0227   Lipase     Component Value Date/Time   LIPASE 17 05/06/2009 1631       Studies/Results: DG Chest 2 View  Result Date: 03/09/2020 CLINICAL DATA:  Recent fall with right fourth through sixth rib fractures. EXAM: CHEST - 2 VIEW COMPARISON:  Chest radiograph from one day prior. FINDINGS: Stable cardiomediastinal silhouette with top-normal heart size. No pneumothorax. No pleural effusion. No pulmonary edema. Mild left basilar atelectasis is unchanged. Redemonstration lateral right mid rib fractures. IMPRESSION: 1. No pneumothorax. 2. Stable mild left basilar atelectasis. Electronically Signed   By: Ilona Sorrel M.D.   On: 03/09/2020 09:44   DG Chest 2 View  Result Date: 03/08/2020 CLINICAL DATA:  Right rib pain, fall EXAM: CHEST - 2 VIEW COMPARISON:  02/05/2020 FINDINGS: Hiatal hernia, stable. Low lung volumes. Stable bibasilar opacities could reflect scarring or atelectasis. Stable bronchitic changes. Heart is normal size. Old healed left humeral neck fracture. Fractures through the lateral right 4th and 5th ribs, new since prior study. No effusion or pneumothorax. IMPRESSION: Acute right lateral 4th and 5th rib fractures. No associated effusion or  pneumothorax. Low lung volumes.  Bibasilar scarring or atelectasis. Stable large hiatal hernia. Electronically Signed   By: Rolm Baptise M.D.   On: 03/08/2020 09:06   CT Head Wo Contrast  Result Date: 03/08/2020 CLINICAL DATA:  Unwitnessed fall. No loss of consciousness. EXAM: CT HEAD WITHOUT CONTRAST CT CERVICAL SPINE WITHOUT CONTRAST TECHNIQUE: Multidetector CT imaging of the head and cervical spine was performed following the standard protocol without intravenous contrast. Multiplanar CT image reconstructions of the cervical spine were also generated. COMPARISON:  February 05, 2020. FINDINGS: CT HEAD FINDINGS Brain: Mild  diffuse cortical atrophy is noted. Mild chronic ischemic white matter disease is noted. Old right cerebellar infarction is noted. No mass effect or midline shift is noted. Ventricular size is within normal limits. There is no evidence of mass lesion, hemorrhage or acute infarction. Vascular: No hyperdense vessel or unexpected calcification. Skull: Normal. Negative for fracture or focal lesion. Sinuses/Orbits: No acute finding. Other: None. CT CERVICAL SPINE FINDINGS Alignment: Normal. Skull base and vertebrae: No acute fracture. No primary bone lesion or focal pathologic process. Soft tissues and spinal canal: No prevertebral fluid or swelling. No visible canal hematoma. Disc levels: Severe degenerative disc disease is noted at C4-5, C5-6 and C6-7 with anterior osteophyte formation. Upper chest: Negative. Other: None. IMPRESSION: 1. Mild diffuse cortical atrophy. Mild chronic ischemic white matter disease. No acute intracranial abnormality seen. 2. Severe multilevel degenerative disc disease. No acute abnormality seen in the cervical spine. Electronically Signed   By: Marijo Conception M.D.   On: 03/08/2020 11:47   CT Chest W Contrast  Result Date: 03/08/2020 CLINICAL DATA:  [place] out of bed this morning. Patient complaining of right chest pain. EXAM: CT CHEST, ABDOMEN, AND PELVIS WITH CONTRAST TECHNIQUE: Multidetector CT imaging of the chest, abdomen and pelvis was performed following the standard protocol during bolus administration of intravenous contrast. CONTRAST:  69mL OMNIPAQUE IOHEXOL 300 MG/ML  SOLN COMPARISON:  08/31/2019 FINDINGS: CT CHEST FINDINGS Cardiovascular: The heart is normal in size. No pericardial effusion. There is mild tortuosity, ectasia and calcification of the thoracic aorta but no aneurysm or dissection. Three-vessel coronary artery calcifications are noted. Mediastinum/Nodes: No mediastinal or hilar mass or adenopathy or hematoma. The esophagus is grossly normal. Small scattered thyroid  nodules are stable. Lungs/Pleura: No acute pulmonary findings. No pulmonary contusion, pneumothorax or pleural effusion. There is streaky bibasilar atelectasis and focal eventration of the right hemidiaphragm. No worrisome pulmonary lesions. The central tracheobronchial tree is unremarkable. Musculoskeletal: There are fourth, fifth and right sixth lateral rib fractures. These are minimally displaced. No definite left-sided rib fractures. The sternum is intact. Scoliosis and degenerative changes involving the thoracic spine but no acute fracture. Extensive soft tissue density involving both breasts. This is unchanged since the prior CT scan and I suspected some type of remote silicone injections. CT ABDOMEN PELVIS FINDINGS Hepatobiliary: No focal hepatic lesions or evidence of acute hepatic injury. No perihepatic fluid collections. Gallbladder is grossly normal. There is intra and extrahepatic biliary dilatation. This is new since the CT scan from September 2020. The common bile duct measures a maximum of 13.5 mm in the head of the pancreas. I do not see an obvious common bile duct stone, ampullary region or pancreatic head mass to account for the dilatation. There could be a distal stricture. Recommend correlation with liver function studies. Patient probably not a good candidate for MRCP given limited breath holding for the CT scan. Pancreas: No mass, inflammation or ductal dilatation. Moderate pancreatic atrophy.  Spleen: Normal size. No focal lesions. Adrenals/Urinary Tract: The adrenal glands are unremarkable. The left kidney is small and demonstrates renal cortical thinning. No renal lesions or hydronephrosis. The bladder is unremarkable. Stomach/Bowel: The stomach, duodenum, small bowel and colon are grossly normal without oral contrast. No acute inflammatory changes, mass lesions or obstructive findings. Vascular/Lymphatic: Advanced atherosclerotic calcifications involving the tortuous abdominal aorta but no  aneurysm. The branch vessels are patent. The major venous structures are patent. No mesenteric or retroperitoneal mass or adenopathy. Reproductive: Surgically absent. Other: No pelvic mass or free pelvic fluid collections. Moderate artifact through the lower pelvis due to bilateral hip hardware. Musculoskeletal: Severe scoliosis and associated degenerative changes. No acute bony findings or destructive bony changes. IMPRESSION: 1. Right fourth, fifth and sixth lateral rib fractures. No pneumothorax or pulmonary contusion. 2. No acute abdominal/pelvic findings, mass lesions or adenopathy. 3. Intra and extrahepatic biliary dilatation, new since CT scan of September 2020. No obvious obstructing common bile duct stone, ampullary lesion or pancreatic head mass. Recommend correlation with liver function studies. Patient probably not a good candidate for MRCP given limited breath holding for the CT scan. 4. Advanced atherosclerotic calcifications involving the thoracic and abdominal aorta and branch vessels including the coronary arteries. Aortic Atherosclerosis (ICD10-I70.0). Electronically Signed   By: Marijo Sanes M.D.   On: 03/08/2020 11:54   CT Cervical Spine Wo Contrast  Result Date: 03/08/2020 CLINICAL DATA:  Unwitnessed fall. No loss of consciousness. EXAM: CT HEAD WITHOUT CONTRAST CT CERVICAL SPINE WITHOUT CONTRAST TECHNIQUE: Multidetector CT imaging of the head and cervical spine was performed following the standard protocol without intravenous contrast. Multiplanar CT image reconstructions of the cervical spine were also generated. COMPARISON:  February 05, 2020. FINDINGS: CT HEAD FINDINGS Brain: Mild diffuse cortical atrophy is noted. Mild chronic ischemic white matter disease is noted. Old right cerebellar infarction is noted. No mass effect or midline shift is noted. Ventricular size is within normal limits. There is no evidence of mass lesion, hemorrhage or acute infarction. Vascular: No hyperdense  vessel or unexpected calcification. Skull: Normal. Negative for fracture or focal lesion. Sinuses/Orbits: No acute finding. Other: None. CT CERVICAL SPINE FINDINGS Alignment: Normal. Skull base and vertebrae: No acute fracture. No primary bone lesion or focal pathologic process. Soft tissues and spinal canal: No prevertebral fluid or swelling. No visible canal hematoma. Disc levels: Severe degenerative disc disease is noted at C4-5, C5-6 and C6-7 with anterior osteophyte formation. Upper chest: Negative. Other: None. IMPRESSION: 1. Mild diffuse cortical atrophy. Mild chronic ischemic white matter disease. No acute intracranial abnormality seen. 2. Severe multilevel degenerative disc disease. No acute abnormality seen in the cervical spine. Electronically Signed   By: Marijo Conception M.D.   On: 03/08/2020 11:47   CT Abdomen Pelvis W Contrast  Result Date: 03/08/2020 CLINICAL DATA:  [place] out of bed this morning. Patient complaining of right chest pain. EXAM: CT CHEST, ABDOMEN, AND PELVIS WITH CONTRAST TECHNIQUE: Multidetector CT imaging of the chest, abdomen and pelvis was performed following the standard protocol during bolus administration of intravenous contrast. CONTRAST:  87mL OMNIPAQUE IOHEXOL 300 MG/ML  SOLN COMPARISON:  08/31/2019 FINDINGS: CT CHEST FINDINGS Cardiovascular: The heart is normal in size. No pericardial effusion. There is mild tortuosity, ectasia and calcification of the thoracic aorta but no aneurysm or dissection. Three-vessel coronary artery calcifications are noted. Mediastinum/Nodes: No mediastinal or hilar mass or adenopathy or hematoma. The esophagus is grossly normal. Small scattered thyroid nodules are stable. Lungs/Pleura: No acute pulmonary findings.  No pulmonary contusion, pneumothorax or pleural effusion. There is streaky bibasilar atelectasis and focal eventration of the right hemidiaphragm. No worrisome pulmonary lesions. The central tracheobronchial tree is unremarkable.  Musculoskeletal: There are fourth, fifth and right sixth lateral rib fractures. These are minimally displaced. No definite left-sided rib fractures. The sternum is intact. Scoliosis and degenerative changes involving the thoracic spine but no acute fracture. Extensive soft tissue density involving both breasts. This is unchanged since the prior CT scan and I suspected some type of remote silicone injections. CT ABDOMEN PELVIS FINDINGS Hepatobiliary: No focal hepatic lesions or evidence of acute hepatic injury. No perihepatic fluid collections. Gallbladder is grossly normal. There is intra and extrahepatic biliary dilatation. This is new since the CT scan from September 2020. The common bile duct measures a maximum of 13.5 mm in the head of the pancreas. I do not see an obvious common bile duct stone, ampullary region or pancreatic head mass to account for the dilatation. There could be a distal stricture. Recommend correlation with liver function studies. Patient probably not a good candidate for MRCP given limited breath holding for the CT scan. Pancreas: No mass, inflammation or ductal dilatation. Moderate pancreatic atrophy. Spleen: Normal size. No focal lesions. Adrenals/Urinary Tract: The adrenal glands are unremarkable. The left kidney is small and demonstrates renal cortical thinning. No renal lesions or hydronephrosis. The bladder is unremarkable. Stomach/Bowel: The stomach, duodenum, small bowel and colon are grossly normal without oral contrast. No acute inflammatory changes, mass lesions or obstructive findings. Vascular/Lymphatic: Advanced atherosclerotic calcifications involving the tortuous abdominal aorta but no aneurysm. The branch vessels are patent. The major venous structures are patent. No mesenteric or retroperitoneal mass or adenopathy. Reproductive: Surgically absent. Other: No pelvic mass or free pelvic fluid collections. Moderate artifact through the lower pelvis due to bilateral hip  hardware. Musculoskeletal: Severe scoliosis and associated degenerative changes. No acute bony findings or destructive bony changes. IMPRESSION: 1. Right fourth, fifth and sixth lateral rib fractures. No pneumothorax or pulmonary contusion. 2. No acute abdominal/pelvic findings, mass lesions or adenopathy. 3. Intra and extrahepatic biliary dilatation, new since CT scan of September 2020. No obvious obstructing common bile duct stone, ampullary lesion or pancreatic head mass. Recommend correlation with liver function studies. Patient probably not a good candidate for MRCP given limited breath holding for the CT scan. 4. Advanced atherosclerotic calcifications involving the thoracic and abdominal aorta and branch vessels including the coronary arteries. Aortic Atherosclerosis (ICD10-I70.0). Electronically Signed   By: Marijo Sanes M.D.   On: 03/08/2020 11:54   US Abdomen Limited RUQ  Result Date: 03/08/2020 CLINICAL DATA:  Dilated bile duct. EXAM: ULTRASOUND ABDOMEN LIMITED RIGHT UPPER QUADRANT COMPARISON:  CT chest abdomen pelvis earlier this day. Patient apparently fell out of bed. Right rib fractures. FINDINGS: Patient had difficulty tolerating the exam due to pain. Examination is of limited diagnostic utility. Gallbladder: Appears physiologically distended. Probable intraluminal gallstone. No sonographic Murphy sign noted by sonographer. Common bile duct: Not demonstrated due to pain and motion. Liver: Limited assessment of liver parenchyma. No obvious focal lesion. Portal vein could not be evaluated due to patient's pain. Other: No right upper quadrant free fluid. IMPRESSION: 1. Technically limited exam, of limited diagnostic utility. Patient did not tolerate exam due to acute right rib fractures. 2. Biliary ductal dilatation on CT is not well-visualized or further evaluated due to patient condition. 3. Probable gallstones without gallbladder inflammation. 4. Recommend follow-up exam after resolution of  acute event when patient is better able  to tolerate probe pressure. Electronically Signed   By: Keith Rake M.D.   On: 03/08/2020 14:25    Anti-infectives: Anti-infectives (From admission, onward)   None       Assessment/Plan Chronic back pain - Neurontin & oxycodone Chronic narcotic use for pain control Hxrhabdomyolysis with AKI after fall 02/05/2020 HTN HLD DNR Recurrent falls from bed Fx 4th, 5th, and 6th right lateral ribs - Continue multimodal pain control and pulmonary toilet. Change percocet to oxycodone PRN and schedule tylenol. Add robaxin PRN. She does not tolerate laxatives well, so I switched miralax to colace. PT/OT consults pending. If pain controlled on oral medications and patient mobilizes well with therapies, ok for discharge. Recommend follow up with PCP. Trauma will sign off, please call with concerns.   ID - none FEN - HH diet VTE - SCDs, per primary/ ok for chemical DVT prophylaxis from trauma standpoint Foley - none Follow up - PCP   LOS: 0 days    Wellington Hampshire, [facility] 03/09/2020, 10:00 AM Please see Amion for pager number during day hours 7:00am-4:30pm

## 2020-03-09 NOTE — Progress Notes (Signed)
Initial Nutrition Assessment  DOCUMENTATION CODES:   Not applicable  INTERVENTION:   -Magic cup TID with meals, each supplement provides 290 kcal and 9 grams of protein -Ensure Enlive po BID, each supplement provides 350 kcal and 20 grams of protein -MVI with minerals daily  NUTRITION DIAGNOSIS:   Inadequate oral intake related to decreased appetite as evidenced by meal completion < 50%, per patient/family report.  GOAL:   Patient will meet greater than or equal to 90% of their needs  MONITOR:   PO intake, Supplement acceptance, Labs, Weight trends, Skin, I & O's  REASON FOR ASSESSMENT:   Consult Assessment of nutrition requirement/status  ASSESSMENT:   Angelica Bell is a 86 y.o. female with medical history significant of chronic back pain, chronic pain disorder with narcotic management, history of dry eyes, hypertension, osteoporosis, pulmonary embolism, pneumonia who was brought to the emergency department after falling from bed at home and developing right-sided pleuritic chest pain.  She does not remember exactly how it happened, but did not hit her head or had LOC.  She denies fever, chills, sore throat, precordial chest pain, dizziness, palpitations, diaphoresis, abdominal pain, nausea, vomiting, diarrhea, melena or hematochezia.  She gets constipated occasionally.  She denies dysuria, frequency or hematuria.  Pt admitted with multiple rib fractures s/p fall.   Reviewed I/O's: +956 ml x 24 hours  UOP: 175 ml x 24 hours  Spoke with pt at bedside, who was sitting up and eating lunch at time of visit. She reports her appetite is "too good". She states she ate breakfast this AM, however, does not remember how much or what she ate. She reports poor appetite at home, which is chronic- per her report she consumes one large meal daily and snacks. Noted meal completion 50% per doc flowsheets.   Reviewed wt hx; noted pt has experienced a 7.8% wt loss over the past month,  which is significant for time frame.   Pt politely declined further RD interview and nutrition-focused physical exam, as she requested that she wanted to eat her lunch. RD honored pt request.   Pt awaiting therapy evaluations for safest discharge disposition.   Labs reviewed.   Diet Order:   Diet Order            Diet Heart Room service appropriate? Yes; Fluid consistency: Thin  Diet effective now              EDUCATION NEEDS:   No education needs have been identified at this time  Skin:  Skin Assessment: Reviewed RN Assessment  Last BM:  03/07/20  Height:   Ht Readings from Last 1 Encounters:  02/05/20 5' (1.524 m)    Weight:   Wt Readings from Last 1 Encounters:  03/08/20 47.6 kg    Ideal Body Weight:  45.5 kg  BMI:  Body mass index is 20.51 kg/m.  Estimated Nutritional Needs:   Kcal:  1200-1400  Protein:  50-65 grams  Fluid:  > 1.2 L    Loistine Chance, RD, LDN, [place] Registered Dietitian II Certified Diabetes Care and Education Specialist Please refer to [facility] for RD and/or RD on-call/weekend/after hours pager

## 2020-03-09 NOTE — Social Work (Signed)
CSW acknowledging consult for SNF placement. Will follow for therapy recommendations needed to best determine disposition/for insurance authorization.   Naima Veldhuizen, MSW, LCSW [place] Clinical Social Work    

## 2020-03-10 DIAGNOSIS — S2249XA Multiple fractures of ribs, unspecified side, initial encounter for closed fracture: Secondary | ICD-10-CM | POA: Diagnosis not present

## 2020-03-10 DIAGNOSIS — N179 Acute kidney failure, unspecified: Secondary | ICD-10-CM | POA: Diagnosis not present

## 2020-03-10 DIAGNOSIS — S2241XA Multiple fractures of ribs, right side, initial encounter for closed fracture: Secondary | ICD-10-CM | POA: Diagnosis not present

## 2020-03-10 DIAGNOSIS — Z66 Do not resuscitate: Secondary | ICD-10-CM | POA: Diagnosis not present

## 2020-03-10 LAB — BASIC METABOLIC PANEL
Anion gap: 11 (ref 5–15)
BUN: 32 mg/dL — ABNORMAL HIGH (ref 8–23)
CO2: 27 mmol/L (ref 22–32)
Calcium: 9.2 mg/dL (ref 8.9–10.3)
Chloride: 103 mmol/L (ref 98–111)
Creatinine, Ser: 1.11 mg/dL — ABNORMAL HIGH (ref 0.44–1.00)
GFR calc Af Amer: 52 mL/min — ABNORMAL LOW (ref >60)
GFR calc non Af Amer: 45 mL/min — ABNORMAL LOW (ref >60)
Glucose, Bld: 94 mg/dL (ref 70–99)
Potassium: 4.2 mmol/L (ref 3.5–5.1)
Sodium: 141 mmol/L (ref 135–145)

## 2020-03-10 LAB — CBC
HCT: 35.1 % — ABNORMAL LOW (ref 36.0–46.0)
Hemoglobin: 11.1 g/dL — ABNORMAL LOW (ref 12.0–15.0)
MCH: 27.6 pg (ref 26.0–34.0)
MCHC: 31.6 g/dL (ref 30.0–36.0)
MCV: 87.3 fL (ref 80.0–100.0)
Platelets: 285 10*3/uL (ref 150–400)
RBC: 4.02 MIL/uL (ref 3.87–5.11)
RDW: 13.9 % (ref 11.5–15.5)
WBC: 9.4 10*3/uL (ref 4.0–10.5)
nRBC: 0 % (ref 0.0–0.2)

## 2020-03-10 MED ORDER — ENSURE ENLIVE PO LIQD: 237.0000 mL | Freq: Two times a day (BID) | ORAL | 12 refills | Status: DC

## 2020-03-10 MED ORDER — OXYCODONE HCL 10 MG PO TABS: 10.0000 mg | ORAL_TABLET | ORAL | 0 refills | Status: DC | PRN

## 2020-03-10 MED ORDER — METHOCARBAMOL 500 MG PO TABS: 500.0000 mg | ORAL_TABLET | Freq: Three times a day (TID) | ORAL | Status: DC | PRN

## 2020-03-10 MED ORDER — ADULT MULTIVITAMIN W/MINERALS CH: 1.0000 | ORAL_TABLET | Freq: Every day | ORAL | Status: AC

## 2020-03-10 NOTE — TOC Progression Note (Signed)
Transition of Care [facility]) - Progression Note    Patient Details  Name: Angelica Bell MRN: [id number] Date of Birth: 1932-02-12  [facility] CM/SW [place], [place] Phone Number: 03/10/2020, 11:34 AM  Clinical Narrative:    CSW spoke with pt at bedside. CSW reintroduced self, role, reason for visit. Pt working with OT. Pt very irritated by CSW at first telling me she couldn't hear me and then saying I was talking too loud, we discussed SNF recs and that although I know she wants to go home that without assistance it may not be safe. OT present also agrees.  Pt states she "knows that better than you do." This Probation officer offered that we have insurance authorization pending for pt to go to rehab and we would have to check for whether or not pt in her copay days since pt states she cannot pay any copays.   Pt perseverating on going home, finally agreed to let me call her grandson, Angelica Bell.   Angelica Bell was called by this Probation officer, spoke with him on his cell phone. He is able to visit in the morning and the afternoon with his work schedule. He states no other family is willing to assist pt as she is not willing to accept much help. He had applied to Medicaid and she was denied, she has been followed at times by APS but that they have signed off due to pt having capacity and refusing assistance. He can pick pt up this evening if she does choose to go home.   At this time, pt has the choice to either go home or to go to SNF, pending choice and auth which has been started.   CSW has given soft hand off to [facility]. Pt has 13 days prior to her copays starting according to [facility].   Expected Discharge Plan: Skilled Nursing Facility(vs. home with home health) Barriers to Discharge: Continued Medical Work up, Ship broker  Expected Discharge Plan and Services Expected Discharge Plan: Skilled Nursing Facility(vs. home with home health) In-house Referral:  Clinical Social Work Discharge Planning Services: CM Consult Living arrangements for the past 2 months: [place]  Readmission Risk Interventions No flowsheet data found.

## 2020-03-10 NOTE — Progress Notes (Signed)
Patient discharged to home with instructions. 

## 2020-03-10 NOTE — Evaluation (Signed)
Occupational Therapy Evaluation Patient Details Name: Angelica Bell MRN: [id number] DOB: 01/19/1932 Today's Date: 03/10/2020    History of Present Illness Pt is an 86 y/o female admitted after a fall at home in which she sustained 4-6 R lateral rib fxs. PMH including but not limited to HTN and IM nail L femur in 2018.   Clinical Impression   PTA patient independent with ADLs, using WC for mobility in home. Admitted for above and limited by problem list below, including pain, decreased activity tolerance, impaired balance, and generalized weakness.  Patient currently requires min assist for transfers and mobility in room using RW, min-max assist for ADLs.  Short blessed test reveals deficits in orientation, STM, attention and sequencing. Will follow acutely, but recommend continued OT services while admitted and after dc at SNF level to optimize independence and safety with ADLS.     Follow Up Recommendations  SNF;Supervision/Assistance - 24 hour(if declines maximal HH services )    Equipment Recommendations  3 in 1 bedside commode    Recommendations for Other Services       Precautions / Restrictions Precautions Precautions: Fall Restrictions Weight Bearing Restrictions: No      Mobility Bed Mobility Overal bed mobility: Needs Assistance Bed Mobility: Rolling;Sidelying to Sit;Sit to Sidelying Rolling: Min guard Sidelying to sit: Min guard     Sit to sidelying: Min guard General bed mobility comments: HOB elevated, increased time and steadying support  Transfers Overall transfer level: Needs assistance Equipment used: Rolling walker (2 wheeled) Transfers: Sit to/from Stand Sit to Stand: Min assist         General transfer comment: min assist for safety/balance, cueing for hand placement     Balance Overall balance assessment: History of Falls;Needs assistance Sitting-balance support: Feet supported;Bilateral upper extremity supported;Single extremity  supported Sitting balance-Leahy Scale: Poor     Standing balance support: Bilateral upper extremity supported;During functional activity;Single extremity supported Standing balance-Leahy Scale: Poor Standing balance comment: min assist for safety/balance, relaint on BUE support dynamically                             ADL either performed or assessed with clinical judgement   ADL Overall ADL's : Needs assistance/impaired     Grooming: Set up;Sitting   Upper Body Bathing: Minimal assistance;Sitting   Lower Body Bathing: Minimal assistance;Sit to/from stand   Upper Body Dressing : Minimal assistance;Sitting   Lower Body Dressing: Sit to/from stand;Moderate assistance Lower Body Dressing Details (indicate cue type and reason): able to don/doff socks, min assist sit to stand; assist to pull underwear up  Toilet Transfer: Minimal assistance;Ambulation;RW;Grab bars;Regular Museum/gallery exhibitions officer and Hygiene: Moderate assistance;Sit to/from stand Toileting - Clothing Manipulation Details (indicate cue type and reason): assist with clothing mgmt, difficulty reaching to underwear due to pain in ribs      Functional mobility during ADLs: Minimal assistance;Rolling walker;Cueing for safety General ADL Comments: limited by pain, weakness, impaired balance      Vision   Vision Assessment?: No apparent visual deficits     Perception     Praxis      Pertinent Vitals/Pain Pain Assessment: Faces Faces Pain Scale: Hurts even more Pain Location: R lateral trunk, ribcage Pain Descriptors / Indicators: Grimacing;Guarding Pain Intervention(s): Monitored during session;Repositioned;Limited activity within patient's tolerance;Patient requesting pain meds-RN notified     Hand Dominance Right   Extremity/Trunk Assessment Upper Extremity Assessment Upper Extremity Assessment: Generalized weakness  Lower Extremity Assessment Lower Extremity Assessment:  Generalized weakness   Cervical / Trunk Assessment Cervical / Trunk Assessment: Kyphotic   Communication Communication Communication: HOH   Cognition Arousal/Alertness: Awake/alert Behavior During Therapy: WFL for tasks assessed/performed;Anxious Overall Cognitive Status: Impaired/Different from baseline Area of Impairment: Orientation;Memory;Following commands;Safety/judgement;Awareness;Problem solving                 Orientation Level: Disoriented to;Time(reports 2022 )   Memory: Decreased recall of precautions Following Commands: Follows multi-step commands consistently;Follows multi-step commands with increased time Safety/Judgement: Decreased awareness of deficits;Decreased awareness of safety Awareness: Emergent Problem Solving: Difficulty sequencing;Requires verbal cues General Comments: short blessed test completed scoring 11/28 (impairment consistent with demenita) and further testing recommended--deficits in orientation, sequencing, and attention    General Comments  educated on need for 24/7 support at dc home     Exercises     Shoulder [place] expects to be discharged to:: Private residence Living Arrangements: Alone Available Help at Discharge: Family;Available PRN/intermittently Type of Home: House Home Access: Stairs to enter [employer] of Steps: 1 Entrance Stairs-Rails: None Home Layout: One level     Bathroom Shower/Tub: Occupational psychologist: Handicapped height     Home Equipment: None          Prior Functioning/Environment Level of Independence: Independent        Comments: reports using WC for mobility around home, independent ADLs, limited IADLs         OT Problem List: Decreased strength;Decreased activity tolerance;Impaired balance (sitting and/or standing);Decreased cognition;Decreased safety awareness;Decreased knowledge of use of DME or AE;Decreased knowledge of  precautions;Pain      OT Treatment/Interventions: Self-care/ADL training;DME and/or AE instruction;Therapeutic activities;Balance training;Patient/family education;Cognitive remediation/compensation;Therapeutic exercise    OT Goals(Current goals can be found in the care plan section) Acute Rehab OT Goals Patient Stated Goal: decrease pain OT Goal Formulation: With patient Time For Goal Achievement: 03/24/20 Potential to Achieve Goals: Good  OT Frequency: Min 2X/week   Barriers to D/C:            Co-evaluation              AM-PAC OT "6 Clicks" Daily Activity     Outcome Measure Help from another person eating meals?: None Help from another person taking care of personal grooming?: A Little Help from another person toileting, which includes using toliet, bedpan, or urinal?: A Lot Help from another person bathing (including washing, rinsing, drying)?: A Little Help from another person to put on and taking off regular upper body clothing?: A Little Help from another person to put on and taking off regular lower body clothing?: A Lot 6 Click Score: 17   End of Session Equipment Utilized During Treatment: Rolling walker Nurse Communication: Mobility status  Activity Tolerance: Patient tolerated treatment well Patient left: in bed;with call bell/phone within reach;with bed alarm set  OT Visit Diagnosis: Other abnormalities of gait and mobility (R26.89);Muscle weakness (generalized) (M62.81);Pain Pain - Right/Left: Right Pain - part of body: (ribs)                Time: [id number] OT Time Calculation (min): 30 min Charges:  OT General Charges $OT Visit: 1 Visit OT Evaluation $OT Eval Moderate Complexity: 1 Mod OT Treatments $Self Care/Home Management : 8-22 mins  Jolaine Artist, OT Acute Rehabilitation Services Pager [phone] Office [phone]   Angelica Bell 03/10/2020, 12:45 PM

## 2020-03-10 NOTE — Progress Notes (Signed)
Await insurance authorization for discharge to short-term skilled nursing facility. Discharge paperwork has been complete, but if patient does not discharge today will need new Covid swab in the a.m. JV

## 2020-03-10 NOTE — Discharge Summary (Addendum)
Physician Discharge Summary  Angelica Bell [id number] DOB: Dec 22, 1932 DOA: 03/08/2020  PCP: Heywood Bene, PA-C  Admit date: 03/08/2020 Discharge date: 03/10/2020  Admitted From: home Discharge disposition: facility   Recommendations for Outpatient Follow-Up:   #1.  Follow-up with primary care provider for evaluation of respiratory status in setting of fractured ribs.  Also recommend evaluating mobility and ongoing need for physical therapy and Occupational Therapy #2.  Recommend daily monitoring of her blood pressure for 2 weeks and adjust her antihypertensive medications as indicated 3.  Consider outpatient imaging of gallbladder #4.  INCENTIVE SPIROMETRY   Discharge Diagnosis:   Principal Problem:   Multiple rib fractures Active Problems:   Essential hypertension   AKI (acute kidney injury) ([place])   Frequent falls   ANXIETY DEPRESSION   Polypharmacy   Leukocytosis   Normocytic anemia   Hyperlipidemia   Common bile duct dilatation   DNR (do not resuscitate)    Discharge Condition: Improved.  Diet recommendation: Low sodium, heart healthy.  Carbohydrate-modified.   Wound care: None.  Code status: Full.   History of Present Illness:   Angelica Bell is a 86 y.o. female with medical history significant of chronic back pain, chronic pain disorder with narcotic management, history of dry eyes, hypertension, osteoporosis, pulmonary embolism, pneumonia who was brought to the emergency department 3/10 after falling from bed at home and developing right-sided pleuritic chest pain.  She did not remember exactly how it happened, but did not hit her head or had LOC.  She denied fever, chills, sore throat, precordial chest pain, dizziness, palpitations, diaphoresis, abdominal pain, nausea, vomiting, diarrhea, melena or hematochezia.  She gets constipated occasionally.  She denied dysuria, frequency or hematuria.   Hospital Course by Problem:   #1.   Multiple rib fractures.  Chest x-ray shows an acute right lateral fourth and fifth rib fracture no associated pneumothorax or effusion.  Repeat chest x-ray reveals redemonstration lateral right mid rib fractures with no pneumothorax stable mid left basilar atelectasis. Patient is ambulating in room performing a.m. care with assistance.  Oxygen saturation level 93% on room air.  Evaluated by trauma who recommended changing pain management oxycodone as needed with scheduled Tylenol and added Robaxin as needed.  Trauma service also recommended pulmonary toilet.  Will discharge to facility for rehab services.  Have decreased her home pain medicines a little and stopped her Myrbetriq as these can contribute to her frequent falls.   #2.  Hypertension controlled.  Home medications include Norvasc, Lasix.  Recommend daily monitoring of her blood pressure for 2 weeks and adjust antihypertensive meds   #3.  Common bile duct dilatation per CT. ultrasound reveals technically limited exam, of limited diagnostic utility. Patient did not tolerate exam due to acute right rib fractures.Biliary ductal dilatation on CT is not well-visualized or further evaluated due to patient condition. Probable gallstones without gallbladder inflammation. Recommend follow-up exam after resolution of acute event when patient is better able to tolerate probe pressure.  Outpatient follow-up    #4.  Anemia.  Hemoglobin 11 on day of discharge.  This is at her baseline No sign symptoms of active bleeding.   #5.  Anxiety/depression.  Appears stable at baseline. Continue home Lexapro   #6.  Leukocytosis.  Resolved at discharge.   #7. Frequent falls.  Patient has had 3 or 4 falls this year so far.  She is quite thin and frail. And take opiates for chronic pain.  She lives at  home by herself with a grandson who checks on her twice a day.  She falls out of bed at home and when in the hospital.  Valuated by physical therapy recommended SNF.  Also  adjusted some of her medications.  #8.  Acute kidney injury.  Resolved at discharge.    Medical Consultants:   Trauma service   Discharge Exam:   Vitals:   03/09/20 1340 03/09/20 2103  BP: 126/63 (!) 115/57  Pulse: (!) 55 68  Resp: 16 15  Temp: (!) 97.5 F (36.4 C) 98.1 F (36.7 C)  SpO2: 95% 92%   Vitals:   03/08/20 2016 03/09/20 0643 03/09/20 1340 03/09/20 2103  BP: (!) 127/56 (!) 110/53 126/63 (!) 115/57  Pulse: (!) 57 63 (!) 55 68  Resp: 15 20 16 15   Temp: (!) 97.5 F (36.4 C) (!) 97.5 F (36.4 C) (!) 97.5 F (36.4 C) 98.1 F (36.7 C)  TempSrc: Oral Oral Oral Oral  SpO2: 97% 93% 95% 92%  Weight:        General exam: Appears calm and comfortable. No acute distress Respiratory system: Clear to auscultation. Respiratory effort normal. Cardiovascular system: S1 & S2 heard, RRR. No JVD,  rubs, gallops or clicks. No murmurs. Gastrointestinal system: Abdomen is nondistended, soft and nontender. No organomegaly or masses felt. Normal bowel sounds heard. Central nervous system: Alert and oriented. No focal neurological deficits. Extremities: No clubbing,  or cyanosis. No edema. Skin: No rashes, lesions or ulcers. Psychiatry: Judgement and insight appear normal. Mood & affect appropriate.    The results of significant diagnostics from this hospitalization (including imaging, microbiology, ancillary and laboratory) are listed below for reference.     Procedures and Diagnostic Studies:   DG Chest 2 View  Result Date: 03/09/2020 CLINICAL DATA:  Recent fall with right fourth through sixth rib fractures. EXAM: CHEST - 2 VIEW COMPARISON:  Chest radiograph from one day prior. FINDINGS: Stable cardiomediastinal silhouette with top-normal heart size. No pneumothorax. No pleural effusion. No pulmonary edema. Mild left basilar atelectasis is unchanged. Redemonstration lateral right mid rib fractures. IMPRESSION: 1. No pneumothorax. 2. Stable mild left basilar atelectasis.  Electronically Signed   By: Ilona Sorrel M.D.   On: 03/09/2020 09:44   DG Chest 2 View  Result Date: 03/08/2020 CLINICAL DATA:  Right rib pain, fall EXAM: CHEST - 2 VIEW COMPARISON:  02/05/2020 FINDINGS: Hiatal hernia, stable. Low lung volumes. Stable bibasilar opacities could reflect scarring or atelectasis. Stable bronchitic changes. Heart is normal size. Old healed left humeral neck fracture. Fractures through the lateral right 4th and 5th ribs, new since prior study. No effusion or pneumothorax. IMPRESSION: Acute right lateral 4th and 5th rib fractures. No associated effusion or pneumothorax. Low lung volumes.  Bibasilar scarring or atelectasis. Stable large hiatal hernia. Electronically Signed   By: Rolm Baptise M.D.   On: 03/08/2020 09:06   CT Head Wo Contrast  Result Date: 03/08/2020 CLINICAL DATA:  Unwitnessed fall. No loss of consciousness. EXAM: CT HEAD WITHOUT CONTRAST CT CERVICAL SPINE WITHOUT CONTRAST TECHNIQUE: Multidetector CT imaging of the head and cervical spine was performed following the standard protocol without intravenous contrast. Multiplanar CT image reconstructions of the cervical spine were also generated. COMPARISON:  February 05, 2020. FINDINGS: CT HEAD FINDINGS Brain: Mild diffuse cortical atrophy is noted. Mild chronic ischemic white matter disease is noted. Old right cerebellar infarction is noted. No mass effect or midline shift is noted. Ventricular size is within normal limits. There is no evidence of  mass lesion, hemorrhage or acute infarction. Vascular: No hyperdense vessel or unexpected calcification. Skull: Normal. Negative for fracture or focal lesion. Sinuses/Orbits: No acute finding. Other: None. CT CERVICAL SPINE FINDINGS Alignment: Normal. Skull base and vertebrae: No acute fracture. No primary bone lesion or focal pathologic process. Soft tissues and spinal canal: No prevertebral fluid or swelling. No visible canal hematoma. Disc levels: Severe degenerative disc  disease is noted at C4-5, C5-6 and C6-7 with anterior osteophyte formation. Upper chest: Negative. Other: None. IMPRESSION: 1. Mild diffuse cortical atrophy. Mild chronic ischemic white matter disease. No acute intracranial abnormality seen. 2. Severe multilevel degenerative disc disease. No acute abnormality seen in the cervical spine. Electronically Signed   By: Marijo Conception M.D.   On: 03/08/2020 11:47   CT Chest W Contrast  Result Date: 03/08/2020 CLINICAL DATA:  [place] out of bed this morning. Patient complaining of right chest pain. EXAM: CT CHEST, ABDOMEN, AND PELVIS WITH CONTRAST TECHNIQUE: Multidetector CT imaging of the chest, abdomen and pelvis was performed following the standard protocol during bolus administration of intravenous contrast. CONTRAST:  14mL OMNIPAQUE IOHEXOL 300 MG/ML  SOLN COMPARISON:  08/31/2019 FINDINGS: CT CHEST FINDINGS Cardiovascular: The heart is normal in size. No pericardial effusion. There is mild tortuosity, ectasia and calcification of the thoracic aorta but no aneurysm or dissection. Three-vessel coronary artery calcifications are noted. Mediastinum/Nodes: No mediastinal or hilar mass or adenopathy or hematoma. The esophagus is grossly normal. Small scattered thyroid nodules are stable. Lungs/Pleura: No acute pulmonary findings. No pulmonary contusion, pneumothorax or pleural effusion. There is streaky bibasilar atelectasis and focal eventration of the right hemidiaphragm. No worrisome pulmonary lesions. The central tracheobronchial tree is unremarkable. Musculoskeletal: There are fourth, fifth and right sixth lateral rib fractures. These are minimally displaced. No definite left-sided rib fractures. The sternum is intact. Scoliosis and degenerative changes involving the thoracic spine but no acute fracture. Extensive soft tissue density involving both breasts. This is unchanged since the prior CT scan and I suspected some type of remote silicone injections. CT ABDOMEN  PELVIS FINDINGS Hepatobiliary: No focal hepatic lesions or evidence of acute hepatic injury. No perihepatic fluid collections. Gallbladder is grossly normal. There is intra and extrahepatic biliary dilatation. This is new since the CT scan from September 2020. The common bile duct measures a maximum of 13.5 mm in the head of the pancreas. I do not see an obvious common bile duct stone, ampullary region or pancreatic head mass to account for the dilatation. There could be a distal stricture. Recommend correlation with liver function studies. Patient probably not a good candidate for MRCP given limited breath holding for the CT scan. Pancreas: No mass, inflammation or ductal dilatation. Moderate pancreatic atrophy. Spleen: Normal size. No focal lesions. Adrenals/Urinary Tract: The adrenal glands are unremarkable. The left kidney is small and demonstrates renal cortical thinning. No renal lesions or hydronephrosis. The bladder is unremarkable. Stomach/Bowel: The stomach, duodenum, small bowel and colon are grossly normal without oral contrast. No acute inflammatory changes, mass lesions or obstructive findings. Vascular/Lymphatic: Advanced atherosclerotic calcifications involving the tortuous abdominal aorta but no aneurysm. The branch vessels are patent. The major venous structures are patent. No mesenteric or retroperitoneal mass or adenopathy. Reproductive: Surgically absent. Other: No pelvic mass or free pelvic fluid collections. Moderate artifact through the lower pelvis due to bilateral hip hardware. Musculoskeletal: Severe scoliosis and associated degenerative changes. No acute bony findings or destructive bony changes. IMPRESSION: 1. Right fourth, fifth and sixth lateral rib fractures. No pneumothorax  or pulmonary contusion. 2. No acute abdominal/pelvic findings, mass lesions or adenopathy. 3. Intra and extrahepatic biliary dilatation, new since CT scan of September 2020. No obvious obstructing common bile duct  stone, ampullary lesion or pancreatic head mass. Recommend correlation with liver function studies. Patient probably not a good candidate for MRCP given limited breath holding for the CT scan. 4. Advanced atherosclerotic calcifications involving the thoracic and abdominal aorta and branch vessels including the coronary arteries. Aortic Atherosclerosis (ICD10-I70.0). Electronically Signed   By: Marijo Sanes M.D.   On: 03/08/2020 11:54   CT Cervical Spine Wo Contrast  Result Date: 03/08/2020 CLINICAL DATA:  Unwitnessed fall. No loss of consciousness. EXAM: CT HEAD WITHOUT CONTRAST CT CERVICAL SPINE WITHOUT CONTRAST TECHNIQUE: Multidetector CT imaging of the head and cervical spine was performed following the standard protocol without intravenous contrast. Multiplanar CT image reconstructions of the cervical spine were also generated. COMPARISON:  February 05, 2020. FINDINGS: CT HEAD FINDINGS Brain: Mild diffuse cortical atrophy is noted. Mild chronic ischemic white matter disease is noted. Old right cerebellar infarction is noted. No mass effect or midline shift is noted. Ventricular size is within normal limits. There is no evidence of mass lesion, hemorrhage or acute infarction. Vascular: No hyperdense vessel or unexpected calcification. Skull: Normal. Negative for fracture or focal lesion. Sinuses/Orbits: No acute finding. Other: None. CT CERVICAL SPINE FINDINGS Alignment: Normal. Skull base and vertebrae: No acute fracture. No primary bone lesion or focal pathologic process. Soft tissues and spinal canal: No prevertebral fluid or swelling. No visible canal hematoma. Disc levels: Severe degenerative disc disease is noted at C4-5, C5-6 and C6-7 with anterior osteophyte formation. Upper chest: Negative. Other: None. IMPRESSION: 1. Mild diffuse cortical atrophy. Mild chronic ischemic white matter disease. No acute intracranial abnormality seen. 2. Severe multilevel degenerative disc disease. No acute abnormality  seen in the cervical spine. Electronically Signed   By: Marijo Conception M.D.   On: 03/08/2020 11:47   CT Abdomen Pelvis W Contrast  Result Date: 03/08/2020 CLINICAL DATA:  [place] out of bed this morning. Patient complaining of right chest pain. EXAM: CT CHEST, ABDOMEN, AND PELVIS WITH CONTRAST TECHNIQUE: Multidetector CT imaging of the chest, abdomen and pelvis was performed following the standard protocol during bolus administration of intravenous contrast. CONTRAST:  1mL OMNIPAQUE IOHEXOL 300 MG/ML  SOLN COMPARISON:  08/31/2019 FINDINGS: CT CHEST FINDINGS Cardiovascular: The heart is normal in size. No pericardial effusion. There is mild tortuosity, ectasia and calcification of the thoracic aorta but no aneurysm or dissection. Three-vessel coronary artery calcifications are noted. Mediastinum/Nodes: No mediastinal or hilar mass or adenopathy or hematoma. The esophagus is grossly normal. Small scattered thyroid nodules are stable. Lungs/Pleura: No acute pulmonary findings. No pulmonary contusion, pneumothorax or pleural effusion. There is streaky bibasilar atelectasis and focal eventration of the right hemidiaphragm. No worrisome pulmonary lesions. The central tracheobronchial tree is unremarkable. Musculoskeletal: There are fourth, fifth and right sixth lateral rib fractures. These are minimally displaced. No definite left-sided rib fractures. The sternum is intact. Scoliosis and degenerative changes involving the thoracic spine but no acute fracture. Extensive soft tissue density involving both breasts. This is unchanged since the prior CT scan and I suspected some type of remote silicone injections. CT ABDOMEN PELVIS FINDINGS Hepatobiliary: No focal hepatic lesions or evidence of acute hepatic injury. No perihepatic fluid collections. Gallbladder is grossly normal. There is intra and extrahepatic biliary dilatation. This is new since the CT scan from September 2020. The common bile duct measures a  maximum of  13.5 mm in the head of the pancreas. I do not see an obvious common bile duct stone, ampullary region or pancreatic head mass to account for the dilatation. There could be a distal stricture. Recommend correlation with liver function studies. Patient probably not a good candidate for MRCP given limited breath holding for the CT scan. Pancreas: No mass, inflammation or ductal dilatation. Moderate pancreatic atrophy. Spleen: Normal size. No focal lesions. Adrenals/Urinary Tract: The adrenal glands are unremarkable. The left kidney is small and demonstrates renal cortical thinning. No renal lesions or hydronephrosis. The bladder is unremarkable. Stomach/Bowel: The stomach, duodenum, small bowel and colon are grossly normal without oral contrast. No acute inflammatory changes, mass lesions or obstructive findings. Vascular/Lymphatic: Advanced atherosclerotic calcifications involving the tortuous abdominal aorta but no aneurysm. The branch vessels are patent. The major venous structures are patent. No mesenteric or retroperitoneal mass or adenopathy. Reproductive: Surgically absent. Other: No pelvic mass or free pelvic fluid collections. Moderate artifact through the lower pelvis due to bilateral hip hardware. Musculoskeletal: Severe scoliosis and associated degenerative changes. No acute bony findings or destructive bony changes. IMPRESSION: 1. Right fourth, fifth and sixth lateral rib fractures. No pneumothorax or pulmonary contusion. 2. No acute abdominal/pelvic findings, mass lesions or adenopathy. 3. Intra and extrahepatic biliary dilatation, new since CT scan of September 2020. No obvious obstructing common bile duct stone, ampullary lesion or pancreatic head mass. Recommend correlation with liver function studies. Patient probably not a good candidate for MRCP given limited breath holding for the CT scan. 4. Advanced atherosclerotic calcifications involving the thoracic and abdominal aorta and branch vessels  including the coronary arteries. Aortic Atherosclerosis (ICD10-I70.0). Electronically Signed   By: Marijo Sanes M.D.   On: 03/08/2020 11:54   US Abdomen Limited RUQ  Result Date: 03/08/2020 CLINICAL DATA:  Dilated bile duct. EXAM: ULTRASOUND ABDOMEN LIMITED RIGHT UPPER QUADRANT COMPARISON:  CT chest abdomen pelvis earlier this day. Patient apparently fell out of bed. Right rib fractures. FINDINGS: Patient had difficulty tolerating the exam due to pain. Examination is of limited diagnostic utility. Gallbladder: Appears physiologically distended. Probable intraluminal gallstone. No sonographic Murphy sign noted by sonographer. Common bile duct: Not demonstrated due to pain and motion. Liver: Limited assessment of liver parenchyma. No obvious focal lesion. Portal vein could not be evaluated due to patient's pain. Other: No right upper quadrant free fluid. IMPRESSION: 1. Technically limited exam, of limited diagnostic utility. Patient did not tolerate exam due to acute right rib fractures. 2. Biliary ductal dilatation on CT is not well-visualized or further evaluated due to patient condition. 3. Probable gallstones without gallbladder inflammation. 4. Recommend follow-up exam after resolution of acute event when patient is better able to tolerate probe pressure. Electronically Signed   By: Keith Rake M.D.   On: 03/08/2020 14:25     Labs:   Basic Metabolic Panel: Recent Labs  Lab 03/08/20 0816 03/08/20 0816 03/09/20 0227 03/10/20 0323  NA 137  --  138 141  K 3.9   < > 4.1 4.2  CL 99  --  102 103  CO2 30  --  28 27  GLUCOSE 128*  --  101* 94  BUN 26*  --  28* 32*  CREATININE 0.84  --  1.31* 1.11*  CALCIUM 9.0  --  8.9 9.2  MG 2.0  --   --   --   PHOS 4.3  --   --   --    < > = values in  this interval not displayed.   GFR Estimated Creatinine Clearance: 26.1 mL/min (A) (by C-G formula based on SCr of 1.11 mg/dL (H)). Liver Function Tests: Recent Labs  Lab 03/08/20 0816  03/09/20 0227  AST 32 20  ALT 19 15  ALKPHOS 86 70  BILITOT 0.4 0.5  PROT 6.4* 5.0*  ALBUMIN 3.7 2.8*   No results for input(s): LIPASE, AMYLASE in the last 168 hours. No results for input(s): AMMONIA in the last 168 hours. Coagulation profile No results for input(s): INR, PROTIME in the last 168 hours.  CBC: Recent Labs  Lab 03/08/20 0816 03/09/20 0227 03/10/20 0323  WBC 14.4* 7.8 9.4  HGB 11.5* 9.3* 11.1*  HCT 38.2 30.0* 35.1*  MCV 90.5 87.5 87.3  PLT 309 251 285   Cardiac Enzymes: Recent Labs  Lab 03/08/20 0816  CKTOTAL 197   BNP: Invalid input(s): POCBNP CBG: No results for input(s): GLUCAP in the last 168 hours. D-Dimer No results for input(s): DDIMER in the last 72 hours. Hgb A1c No results for input(s): HGBA1C in the last 72 hours. Lipid Profile No results for input(s): CHOL, HDL, LDLCALC, TRIG, CHOLHDL, LDLDIRECT in the last 72 hours. Thyroid function studies No results for input(s): TSH, T4TOTAL, T3FREE, THYROIDAB in the last 72 hours.  Invalid input(s): FREET3 Anemia work up No results for input(s): VITAMINB12, FOLATE, FERRITIN, TIBC, IRON, RETICCTPCT in the last 72 hours. Microbiology Recent Results (from the past 240 hour(s))  SARS CORONAVIRUS 2 (TAT 6-24 HRS) Nasopharyngeal Nasopharyngeal Swab     Status: None   Collection Time: 03/08/20  9:27 AM   Specimen: Nasopharyngeal Swab  Result Value Ref Range Status   SARS Coronavirus 2 NEGATIVE NEGATIVE Final    Comment: (NOTE) SARS-CoV-2 target nucleic acids are NOT DETECTED. The SARS-CoV-2 RNA is generally detectable in upper and lower respiratory specimens during the acute phase of infection. Negative results do not preclude SARS-CoV-2 infection, do not rule out co-infections with other pathogens, and should not be used as the sole basis for treatment or other patient management decisions. Negative results must be combined with clinical observations, patient history, and epidemiological  information. The expected result is Negative. Fact Sheet for Patients: SugarRoll.be Fact Sheet for Healthcare Providers: https://www.woods-mathews.com/ This test is not yet approved or cleared by the [place] FDA and  has been authorized for detection and/or diagnosis of SARS-CoV-2 by FDA under an Emergency Use Authorization (EUA). This EUA will remain  in effect (meaning this test can be used) for the duration of the COVID-19 declaration under Section 56 4(b)(1) of the Act, 21 U.S.C. section 360bbb-3(b)(1), unless the authorization is terminated or revoked sooner. Performed at [facility], [place] [address] [place], [place] [address]      Discharge Instructions:   Discharge Instructions     Diet - low sodium heart healthy   Complete by: As directed    Discharge instructions   Complete by: As directed    Take medications as prescribed PT/OT as recommended Incentive spirometry Keep mobile   Increase activity slowly   Complete by: As directed       Allergies as of 03/10/2020       Reactions   Hctz [hydrochlorothiazide]    Significant and rapid hyponatremia (TIH)   Amoxicillin-pot Clavulanate Hives, Diarrhea   Has patient had a PCN reaction causing immediate rash, facial/tongue/throat swelling, SOB or lightheadedness with hypotension: yes Has patient had a PCN reaction causing severe rash involving mucus membranes or skin necrosis: yes Has  patient had a PCN reaction that required hospitalization : unknown Has patient had a PCN reaction occurring within the last 10 years: unknown If all of the above answers are "NO", then may proceed with Cephalosporin use.   Lisinopril-hydrochlorothiazide Other (See Comments)   Tired- no energy to do anything..   Sulfa Antibiotics Hives, Nausea And Vomiting   Sulfonamide Derivatives Hives        Medication List     STOP taking these medications    Myrbetriq 25 MG Tb24  tablet Generic drug: mirabegron ER   pantoprazole 40 MG tablet Commonly known as: PROTONIX   polyethylene glycol 17 g packet Commonly known as: MIRALAX / GLYCOLAX       TAKE these medications    amLODipine 10 MG tablet Commonly known as: NORVASC Take 1 tablet (10 mg total) by mouth daily.   aspirin 81 MG EC tablet Take 1 tablet (81 mg total) by mouth daily.   atorvastatin 10 MG tablet Commonly known as: LIPITOR Take 1 tablet (10 mg total) by mouth daily at 6 PM.   escitalopram 20 MG tablet Commonly known as: LEXAPRO Take 20 mg by mouth daily.   feeding supplement (ENSURE ENLIVE) Liqd Take 237 mLs by mouth 2 (two) times daily between meals.   ferrous sulfate 325 (65 FE) MG EC tablet Take 325 mg by mouth daily.   furosemide 20 MG tablet Commonly known as: LASIX Take 20 mg by mouth daily.   gabapentin 100 MG capsule Commonly known as: NEURONTIN Take 1 capsule (100 mg total) by mouth 3 (three) times daily. What changed:  when to take this reasons to take this   methocarbamol 500 MG tablet Commonly known as: ROBAXIN Take 1 tablet (500 mg total) by mouth every 8 (eight) hours as needed for muscle spasms.   multivitamin with minerals Tabs tablet Take 1 tablet by mouth daily. Start taking on: March 11, 2020   Oxycodone HCl 10 MG Tabs Take 1-1.5 tablets (10-15 mg total) by mouth every 4 (four) hours as needed for moderate pain or severe pain. What changed:  medication strength how much to take when to take this reasons to take this       Contact information for after-discharge care     Destination     [facility] .   Service: Skilled Nursing Contact information: [address] [place] [phone]                 Time coordinating discharge: 35 minutes  Signed:  Radene Gunning NP  [facility] 03/10/2020, 1:33 PM

## 2020-03-10 NOTE — NC FL2 (Signed)
[place] MEDICAID FL2 LEVEL OF CARE SCREENING TOOL     IDENTIFICATION  Patient Name: Angelica Bell Birthdate: 05/19/1932 Sex: female Admission Date (Current Location): 03/08/2020  [facility] and [place] Number:  Herbalist and Address:  [facility]. [facility], [place] [address], [place], [place] [address]      Provider Number: [id number]  Attending Physician Name and Address:  Geradine Girt, DO  Relative Name and Phone Number:       Current Level of Care: Hospital Recommended Level of Care: [place] Prior Approval Number:    Date Approved/Denied:   PASRR Number: [id number]  Discharge Plan: SNF    Current Diagnoses: Patient Active Problem List   Diagnosis Date Noted  . DNR (do not resuscitate) 03/09/2020  . Frequent falls 03/09/2020  . Multiple rib fractures 03/08/2020  . Common bile duct dilatation 03/08/2020  . Leukocytosis 03/08/2020  . Normocytic anemia 03/08/2020  . Hypomagnesemia 02/08/2020  . Fall 02/06/2020  . AKI (acute kidney injury) ([place]) 02/05/2020  . UTI (urinary tract infection) 09/01/2019  . Rhabdomyolysis 09/01/2019  . Shock ([place]) 08/31/2019  . Acute cystitis 08/03/2019  . Confusion and disorientation 08/03/2019  . Cerebrovascular accident (CVA) ([place])   . ARF (acute renal failure) ([place]) 07/26/2019  . Hyponatremia 07/26/2019  . Abnormal liver function 07/26/2019  . Peripheral neuropathy   . Drug overdose   . Polypharmacy   . Altered mental status 10/25/2017  . Closed displaced intertrochanteric fracture of left femur ([place]) 02/16/2017  . Hip fracture, right ([place]) 12/25/2015  . Chronic pain 12/24/2015  . Fracture of femoral neck, right, closed ([place]) 12/24/2015  . Essential hypertension 12/24/2015  . Community acquired pneumonia 04/15/2013  . Hypokalemia 04/15/2013  . Labial cyst 02/08/2012  . POSTMENOPAUSAL SYNDROME 11/28/2009  . PERIPHERAL NEUROPATHY, LOWER EXTREMITY, RIGHT 02/22/2009  .  INSOMNIA-SLEEP DISORDER-UNSPEC 10/12/2008  . Lumbago 08/02/2008  . 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 ANXIETY DEPRESSION 12/16/2007  . Hyperlipidemia 07/08/2007  . Osteoarthritis 07/08/2007  . Osteoporosis 07/08/2007  . SCOLIOSIS NEC 07/08/2007    Orientation RESPIRATION BLADDER Height & Weight     Self, Situation, Time, Place  Normal Continent Weight: 105 lb (47.6 kg) Height:     BEHAVIORAL SYMPTOMS/MOOD NEUROLOGICAL BOWEL NUTRITION STATUS      Continent Diet(see discharge summary)  AMBULATORY STATUS COMMUNICATION OF NEEDS Skin   Extensive Assist Verbally Normal, Bruising                       Personal Care Assistance Level of Assistance  Dressing, Feeding, Bathing Bathing Assistance: Maximum assistance Feeding assistance: Independent Dressing Assistance: Maximum assistance     Functional Limitations Info  Sight, Hearing, Speech Sight Info: Adequate Hearing Info: Impaired Speech Info: Adequate    SPECIAL CARE FACTORS FREQUENCY  PT (By licensed PT), OT (By licensed OT)     PT Frequency: 5x week OT Frequency: 5x week            Contractures Contractures Info: Not present    Additional Factors Info  Code Status, Allergies, Psychotropic Code Status Info: DNR Allergies Info: Hctz (Hydrochlorothiazide), Amoxicillin-pot Clavulanate, Lisinopril-hydrochlorothiazide, Sulfa Antibiotics, Sulfonamide Derivatives Psychotropic Info: escitalopram (LEXAPRO) tablet 20 mg daily PO         Current Medications (03/10/2020):  This is the current hospital active medication list Current Facility-Administered Medications  Medication Dose Route Frequency Provider Last Rate Last Admin  . acetaminophen (TYLENOL) tablet 650 mg  650 mg Oral  Q6H Meuth, Brooke A, PA-C   650 mg at 03/10/20 [id number]  . aspirin EC tablet 81 mg  81 mg Oral Daily Reubin Milan, MD   81 mg at 03/10/20 [id number]  . docusate sodium (COLACE) capsule 100 mg  100 mg Oral BID Meuth, Brooke A, PA-C    100 mg at 03/09/20 2122  . escitalopram (LEXAPRO) tablet 20 mg  20 mg Oral Daily Reubin Milan, MD   20 mg at 03/10/20 0926  . feeding supplement (ENSURE ENLIVE) (ENSURE ENLIVE) liquid 237 mL  237 mL Oral BID BM Vann, Jessica U, DO      . ferrous sulfate tablet 325 mg  325 mg Oral Daily Reubin Milan, MD   325 mg at 03/10/20 0925  . gabapentin (NEURONTIN) capsule 100 mg  100 mg Oral TID Reubin Milan, MD   100 mg at 03/10/20 0926  . methocarbamol (ROBAXIN) tablet 500 mg  500 mg Oral Q8H PRN Meuth, Brooke A, PA-C   500 mg at 03/10/20 [id number]  . mirabegron ER (MYRBETRIQ) tablet 25 mg  25 mg Oral Daily Reubin Milan, MD   25 mg at 03/10/20 0926  . multivitamin with minerals tablet 1 tablet  1 tablet Oral Daily Eulogio Bear U, DO   1 tablet at 03/10/20 [id number]  . ondansetron (ZOFRAN) tablet 4 mg  4 mg Oral Q6H PRN Reubin Milan, MD       Or  . ondansetron [facility]) injection 4 mg  4 mg Intravenous Q6H PRN Reubin Milan, MD      . oxyCODONE (Oxy IR/ROXICODONE) immediate release tablet 10-15 mg  10-15 mg Oral Q4H PRN Eulogio Bear U, DO   10 mg at 03/10/20 0925  . pantoprazole (PROTONIX) EC tablet 40 mg  40 mg Oral Daily Reubin Milan, MD   40 mg at 03/10/20 [id number]     Discharge Medications: Please see discharge summary for a list of discharge medications.  Relevant Imaging Results:  Relevant Lab Results:   Additional Information SSN: [id number]  Alexander Mt, LCSW

## 2020-03-10 NOTE — Care Management (Addendum)
Discussed disposition with patient at bedside, SNF vs home. Patient has decided to go to [facility] today.  Juliann Pulse at [facility] aware and will NCM know when she has insurance authorization.   Marchia Bond Case [id number] also aware. Magdalen Spatz RN

## 2020-03-11 LAB — BASIC METABOLIC PANEL
Anion gap: 9 (ref 5–15)
BUN: 26 mg/dL — ABNORMAL HIGH (ref 8–23)
CO2: 26 mmol/L (ref 22–32)
Calcium: 9.1 mg/dL (ref 8.9–10.3)
Chloride: 106 mmol/L (ref 98–111)
Creatinine, Ser: 1.04 mg/dL — ABNORMAL HIGH (ref 0.44–1.00)
GFR calc Af Amer: 56 mL/min — ABNORMAL LOW (ref >60)
GFR calc non Af Amer: 49 mL/min — ABNORMAL LOW (ref >60)
Glucose, Bld: 111 mg/dL — ABNORMAL HIGH (ref 70–99)
Potassium: 4.3 mmol/L (ref 3.5–5.1)
Sodium: 141 mmol/L (ref 135–145)

## 2020-03-11 MED ORDER — TRAMADOL HCL 50 MG PO TABS
50.0000 mg | ORAL_TABLET | Freq: Once | ORAL | Status: AC
  Administered 2020-03-12: 50 mg via ORAL
  Filled 2020-03-11: qty 1

## 2020-03-11 NOTE — Progress Notes (Signed)
Pt's grandson called patient and told her that he can not come get her until tomorrow morning. He was supposed to pick her up at 2130 tonight. Pt called the desk to inform staff that he was not coming. Lovey Newcomer NP text paged.

## 2020-03-11 NOTE — TOC Progression Note (Addendum)
Transition of Care [facility]) - Progression Note    Patient Details  Name: Arine Luks MRN: [id number] Date of Birth: 1932-08-14  Transition of Care [facility]) CM/SW [place], [place] Phone Number: 03/11/2020, 11:33 AM  Clinical Narrative:    CSW spoke to pt by phone.  CSW updated  pt that [employer] received insurance authorization for rehab. Pt has refused SNF and wants to go home.  Pt  has capacity and can articulate the risks of going home.  CSW LVM for grandson to return phone call.   CSW updated [employer] on pt refusal.    Expected Discharge Plan: Skilled Nursing Facility(vs. home with home health) Barriers to Discharge: Continued Medical Work up, Orthoptist and Services Expected Discharge Plan: Skilled Nursing Facility(vs. home with home health) In-house Referral: Clinical Social Work Discharge Planning Services: CM Consult   Living arrangements for the past 2 months: [place] Expected Discharge Date: 03/10/20                                     Social Determinants of Health (SDOH) Interventions    Readmission Risk Interventions No flowsheet data found.
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
## 2020-03-11 NOTE — Discharge Summary (Signed)
Patient refused SNF.  She currently has capacity and can articulate the risks of going home.  Home health ordered and d/c order placed. Eulogio Bear DO

## 2020-03-12 NOTE — Progress Notes (Signed)
Pt D/C with grandson to home. Pt educated that safety at home alone  And limited support by family would not be recommended but pt still refused SNF for rehab.  VSS pain still elevated at 7/10 after meds pt still wanted d/c now.  Breathing regular and unlabored and d/c instruction given and explained to pt and grandson. Demonstrates understanding

## 2020-03-23 IMAGING — CR PELVIS - 1-2 VIEW
1 series · 1 of 1 positions shown · non-contrast
Comparison: CT pelvis dated 09/10/2016

CLINICAL DATA: Fall, bilateral leg pain

EXAM:
PELVIS - 1-2 VIEW

[t pelvis ap]
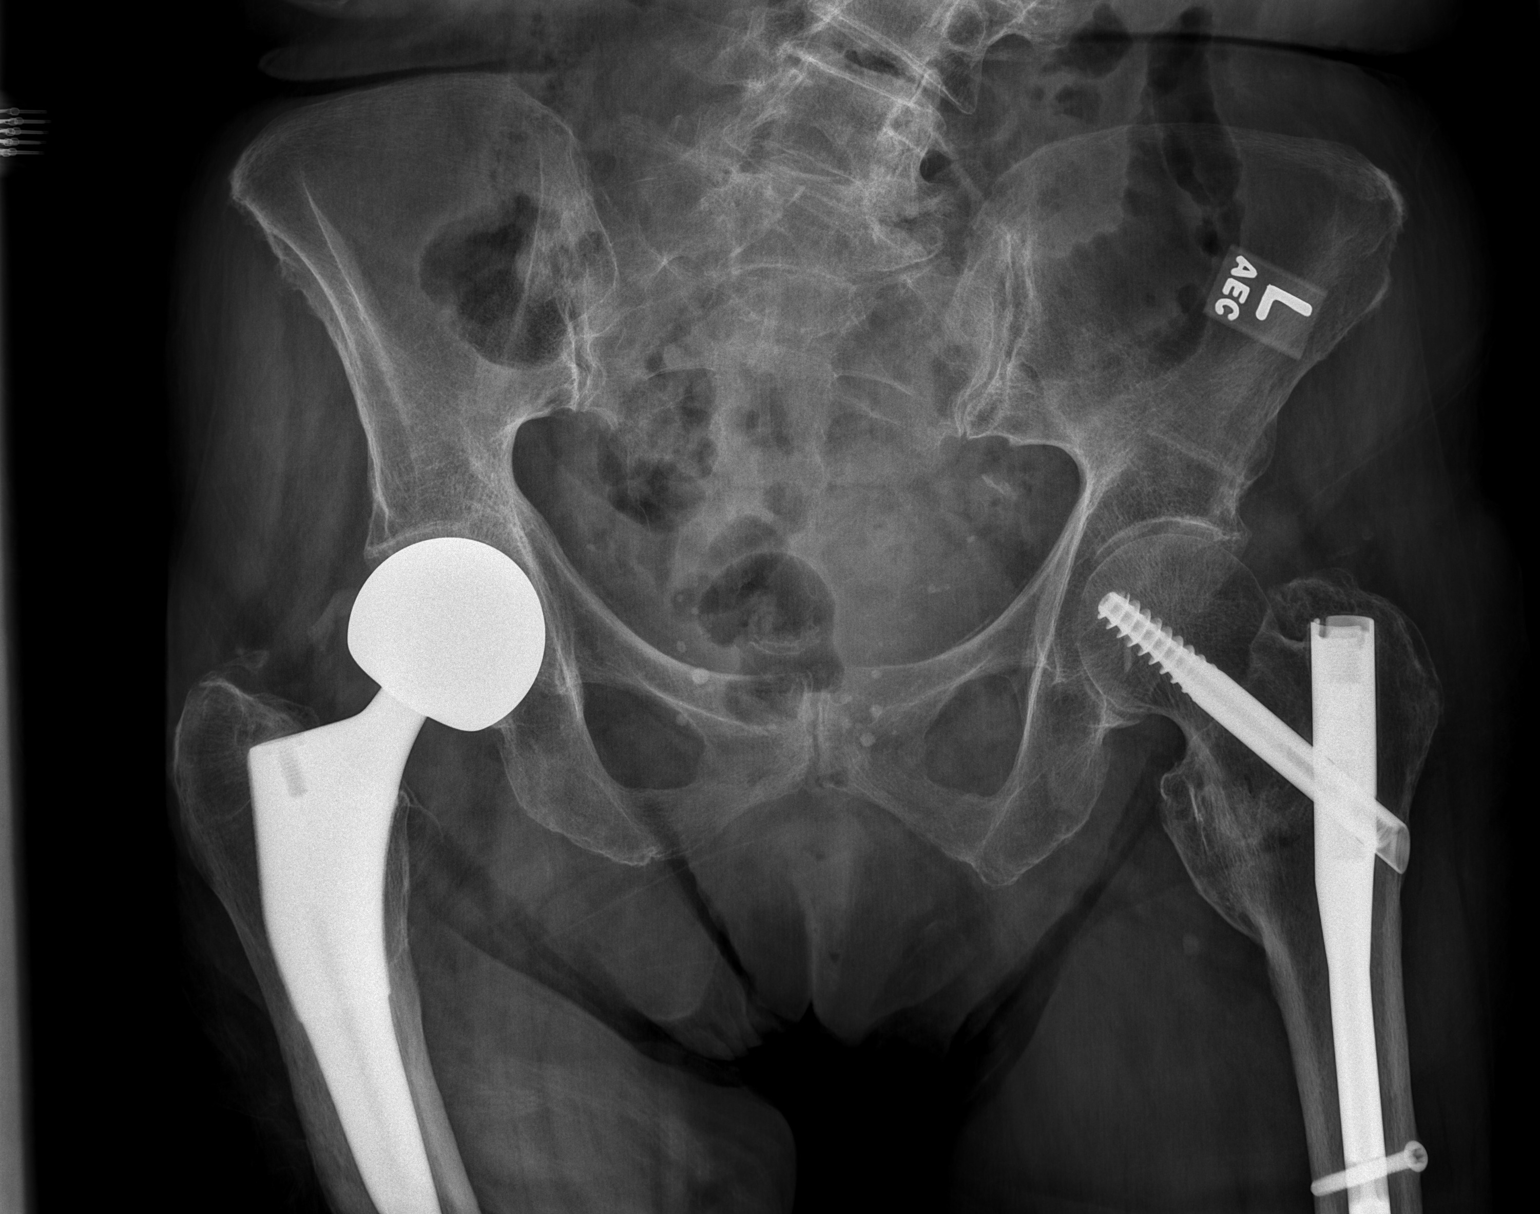

[1 of 1 positions shown; findings below may reference images not displayed]

FINDINGS: No fracture or dislocation is seen. Visualized bony pelvis appears
intact.

Right hip arthroplasty.

Status post ORIF of a prior intertrochanteric left hip fracture.

Degenerative changes the lower lumbar spine.
IMPRESSION: Status post right hip arthroplasty.

Status post ORIF of a prior intertrochanteric left hip fracture.

No acute fracture is seen.

## 2020-03-23 IMAGING — CR LEFT FEMUR 2 VIEWS
4 series · 4 of 4 positions shown · non-contrast
Comparison: Intraoperative left femur radiographs dated 02/17/2017

CLINICAL DATA: Fall, bilateral leg pain

EXAM:
LEFT FEMUR 2 VIEWS

[t femur distal ap left]
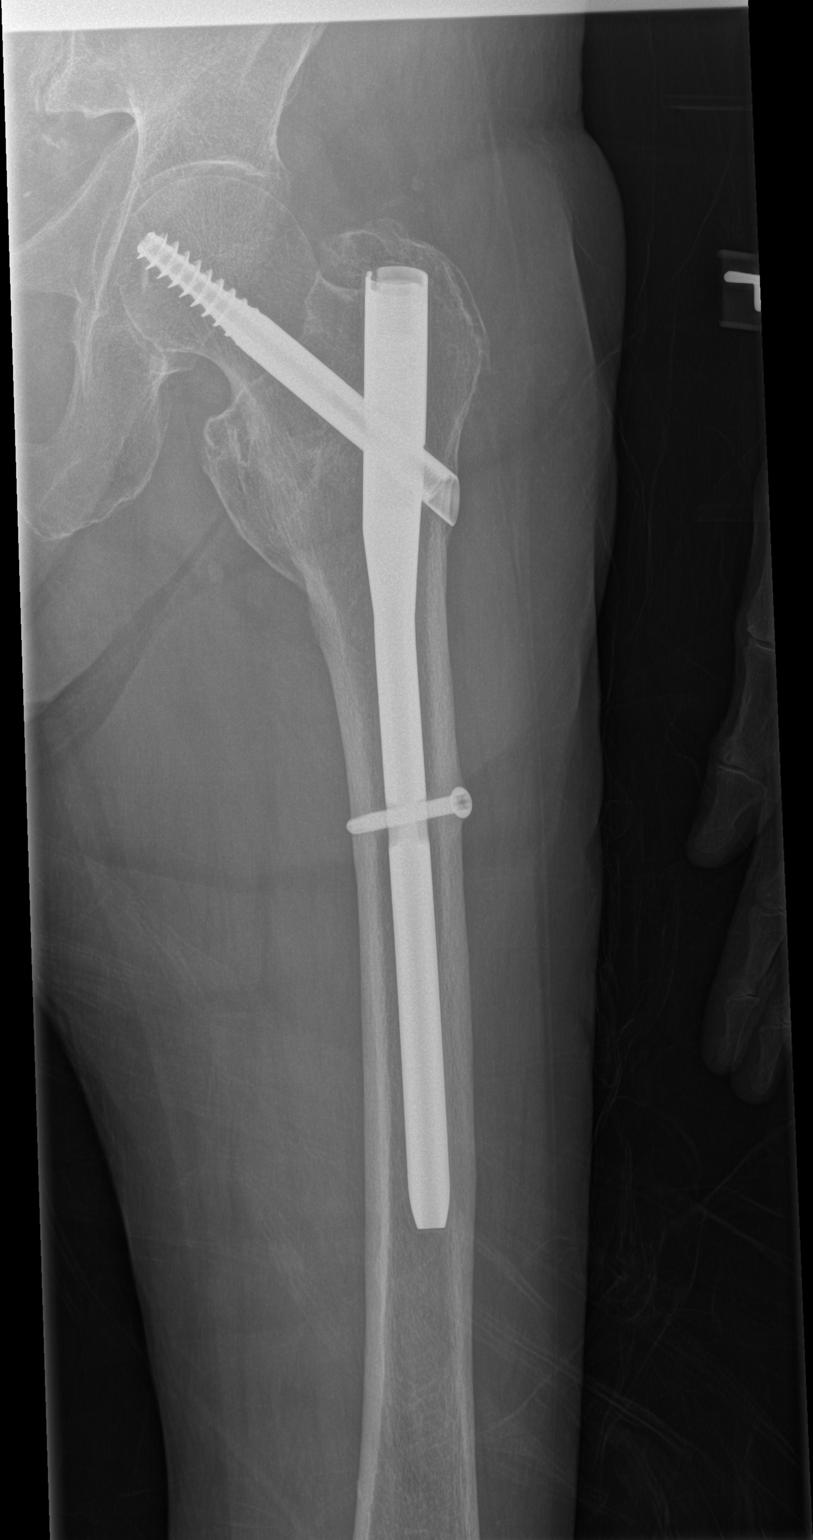

[t femur distal lat left]
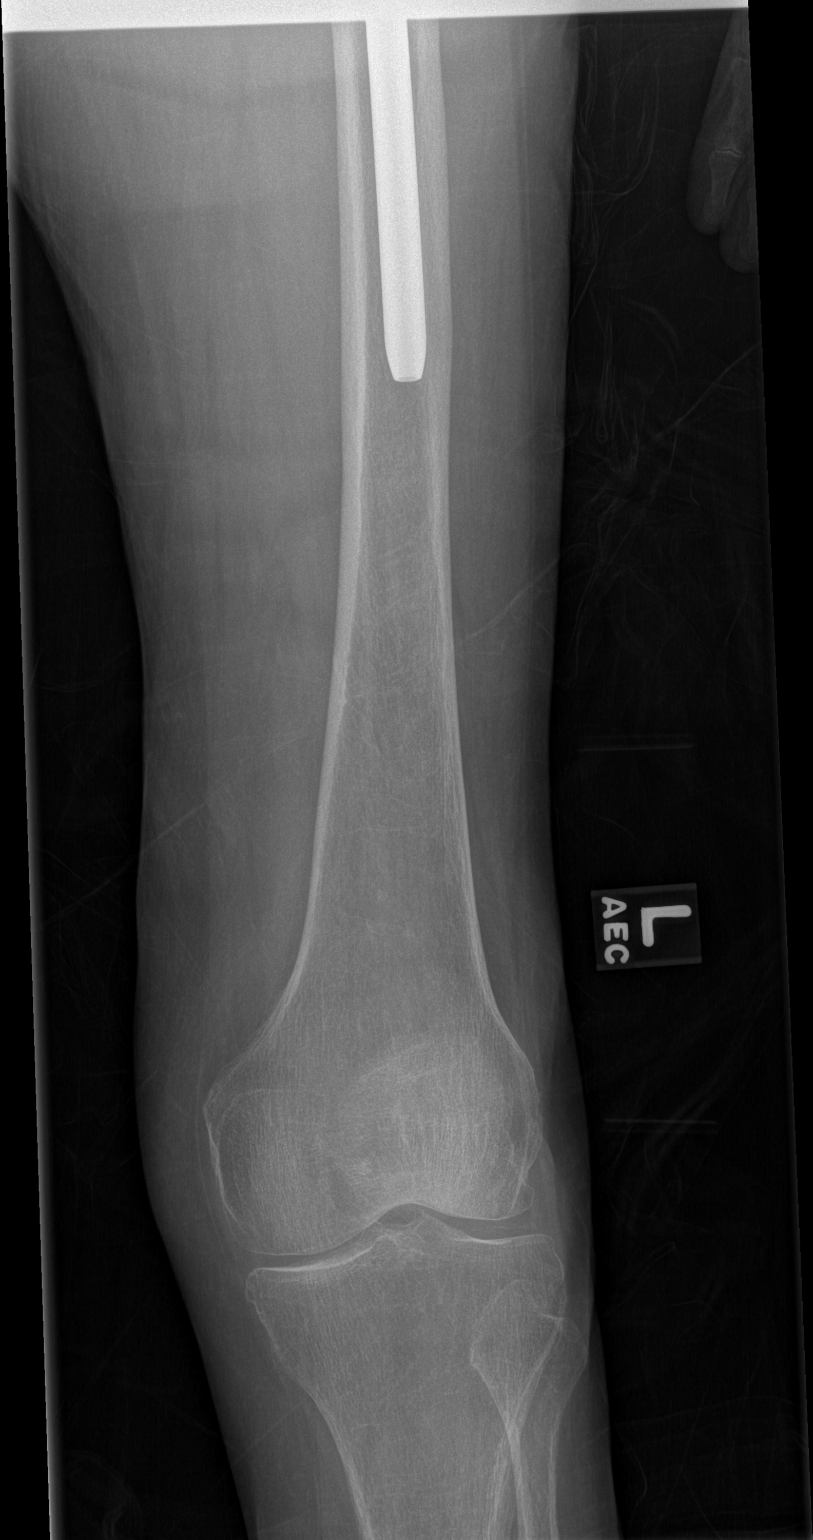

[t femur proximal lat left]
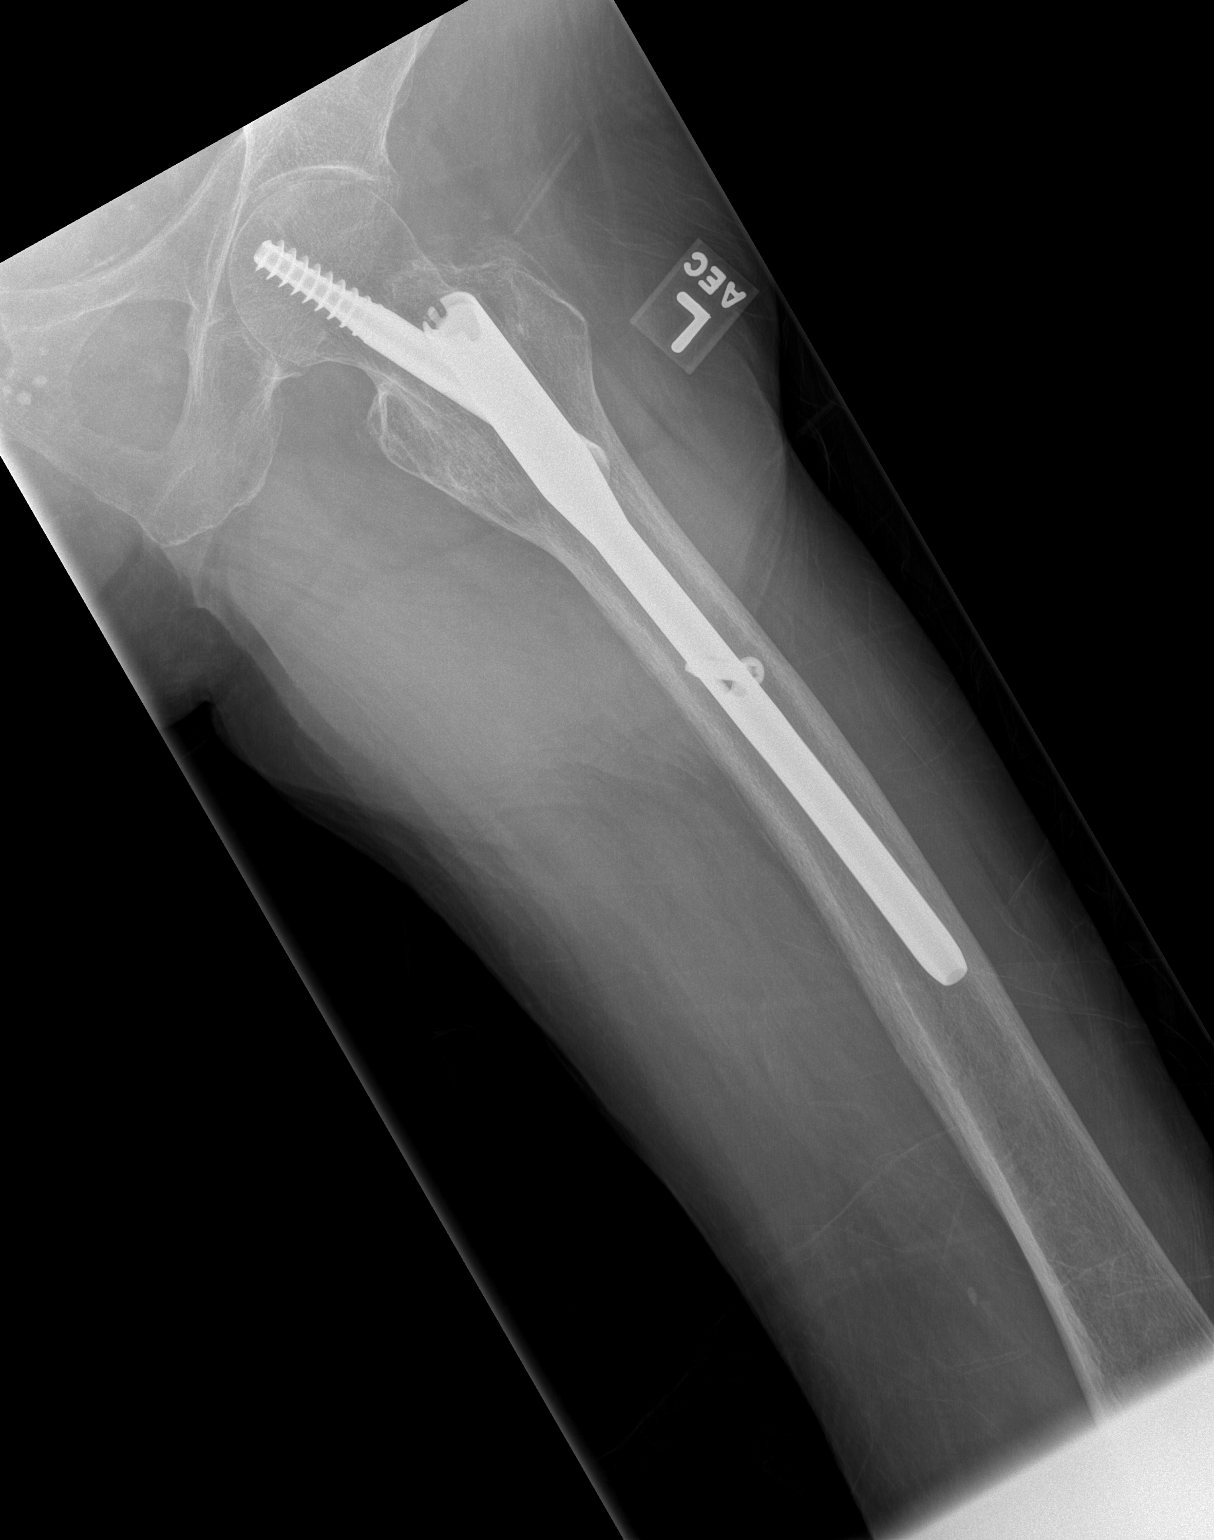

[x femur distal ap left]
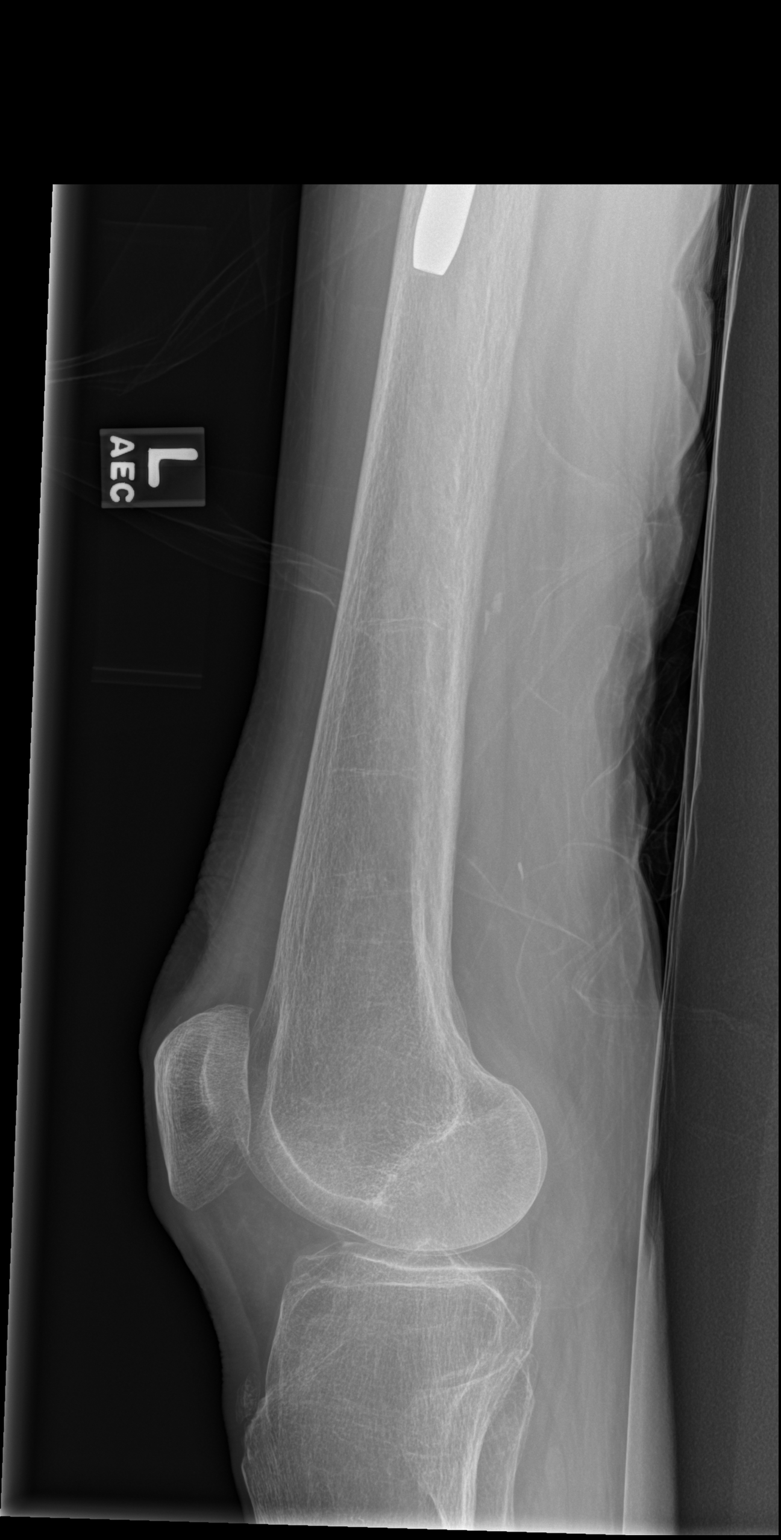

[4 of 4 positions shown; findings below may reference images not displayed]

FINDINGS: Status post ORIF of an intertrochanteric left hip fracture. No
evidence of hardware loosening or complication.

No evidence of acute fracture or dislocation.

Left hip joint space is preserved.

Distal left femur is unremarkable.
IMPRESSION: Status post ORIF of an intertrochanteric left hip fracture, as
above, without evidence of complication.

No evidence of acute fracture or dislocation.

## 2020-03-24 IMAGING — CR RIGHT WRIST - COMPLETE 3+ VIEW
4 series · 4 of 4 positions shown · non-contrast
Comparison: None.

CLINICAL DATA: Fall, right wrist pain

EXAM:
RIGHT WRIST - COMPLETE 3+ VIEW

[x wrist pa right]
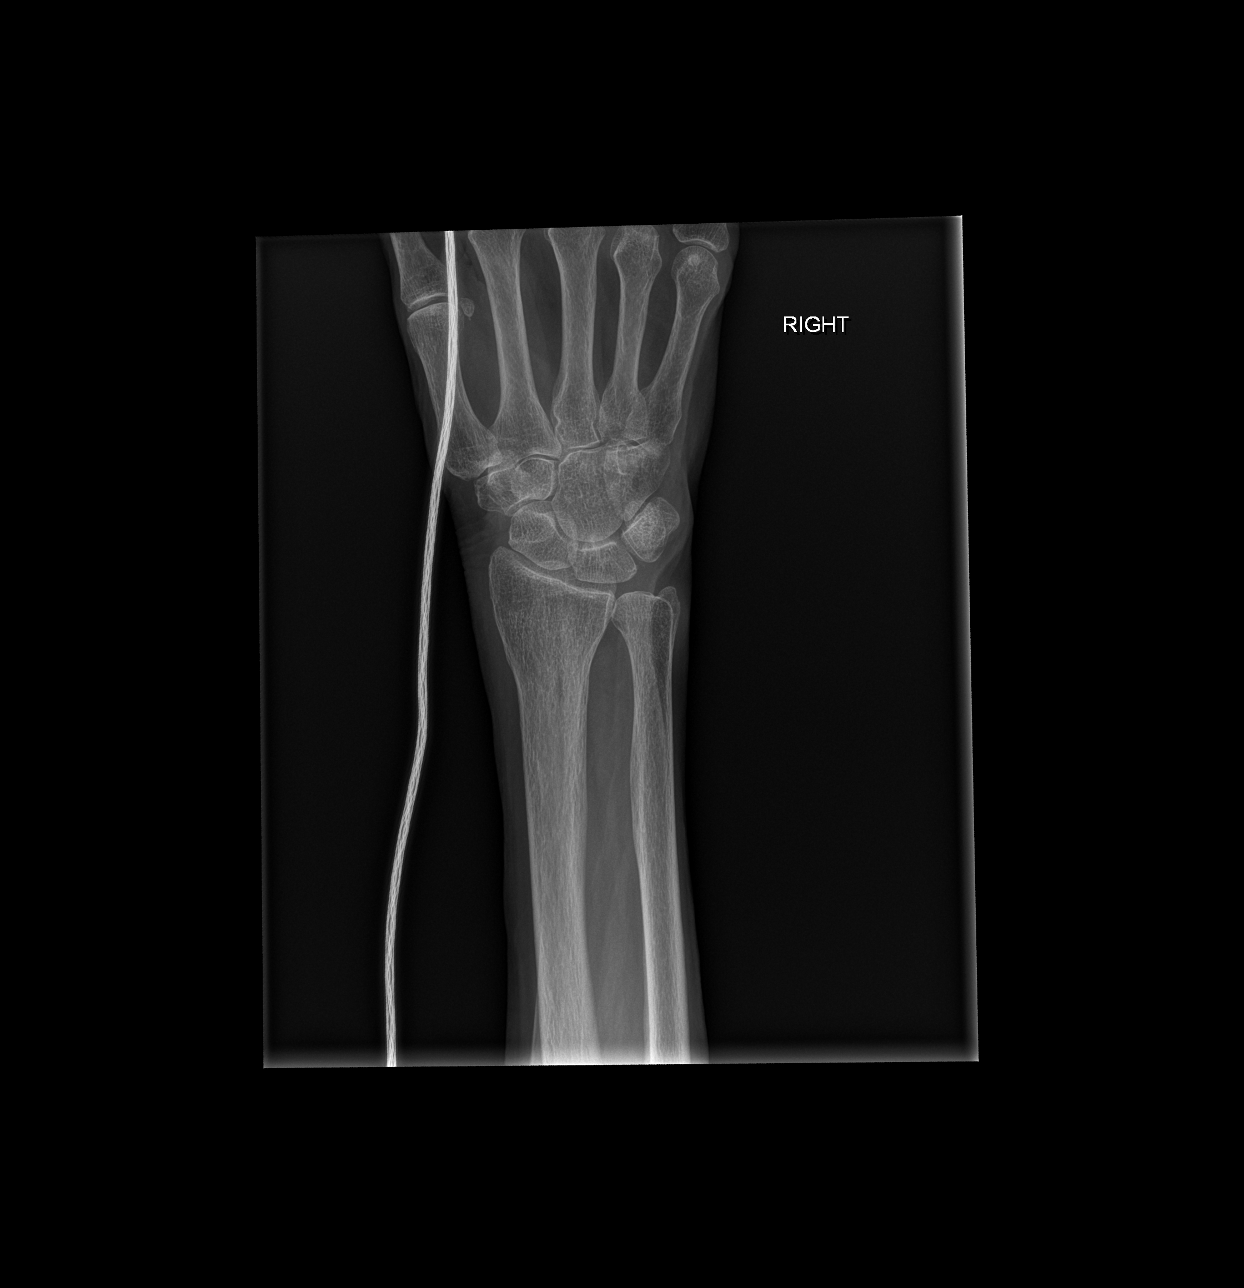

[x wrist obl right]
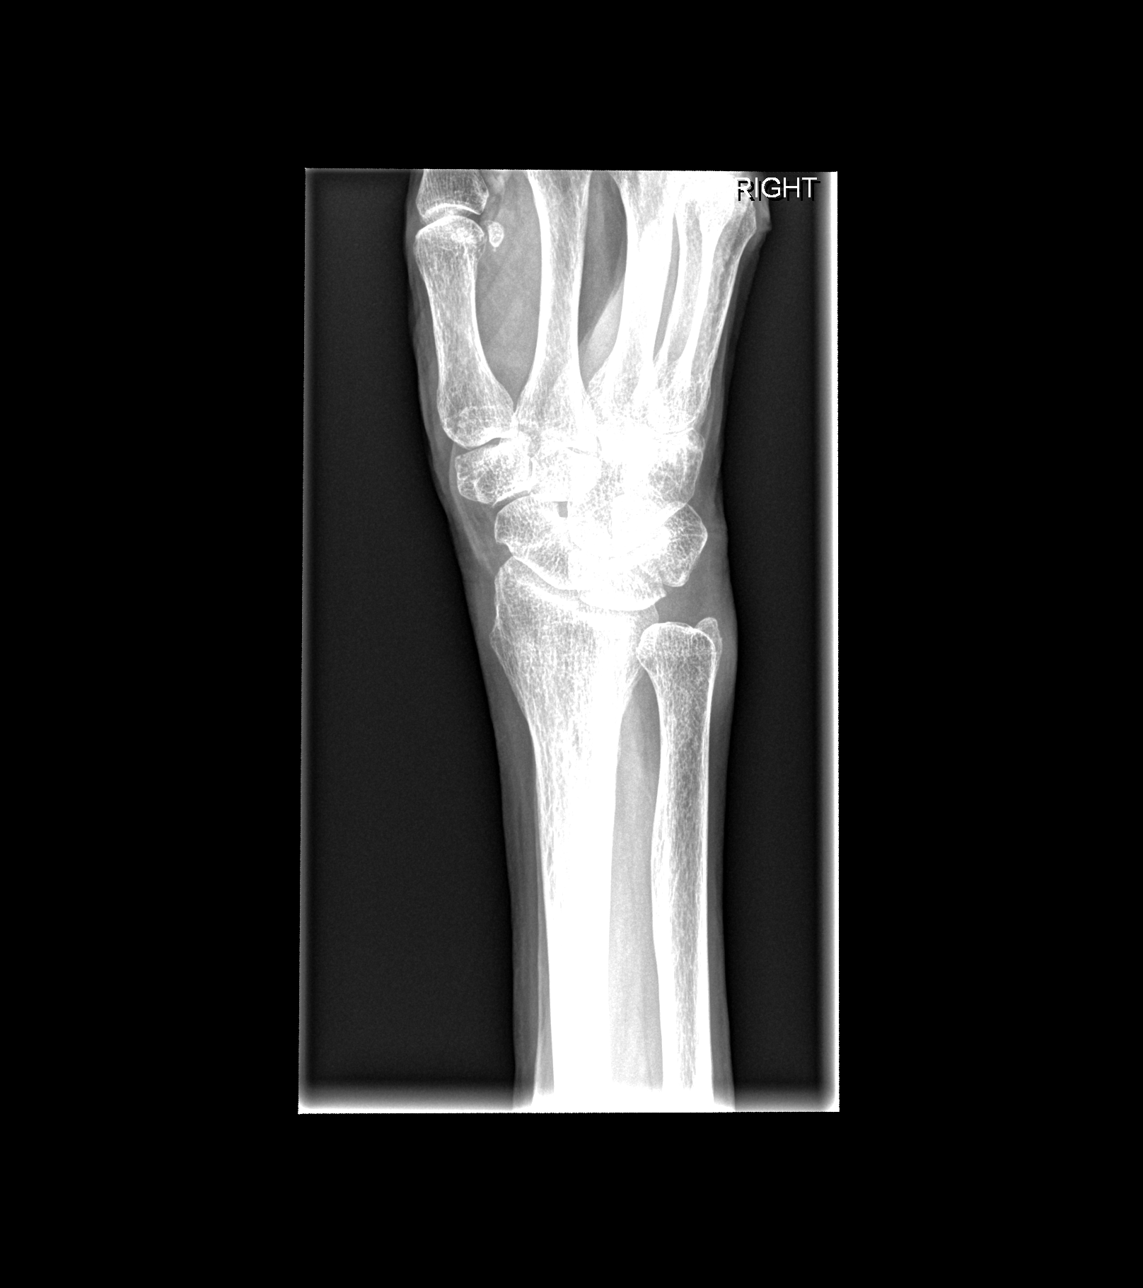

[x wrist lat right]
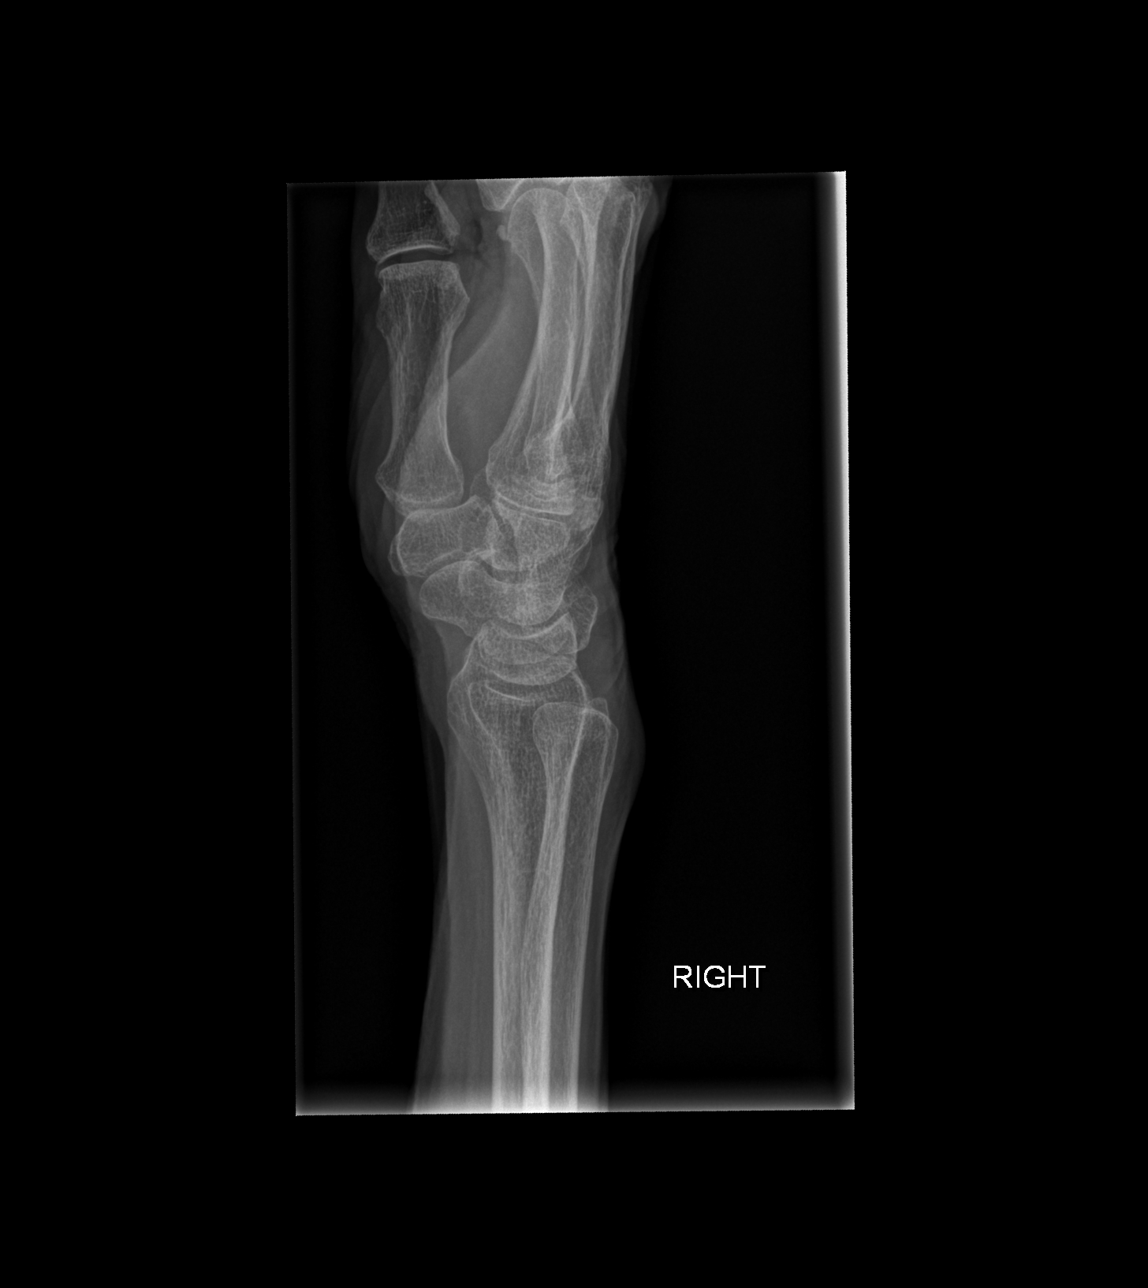

[x wrist navicular view right]
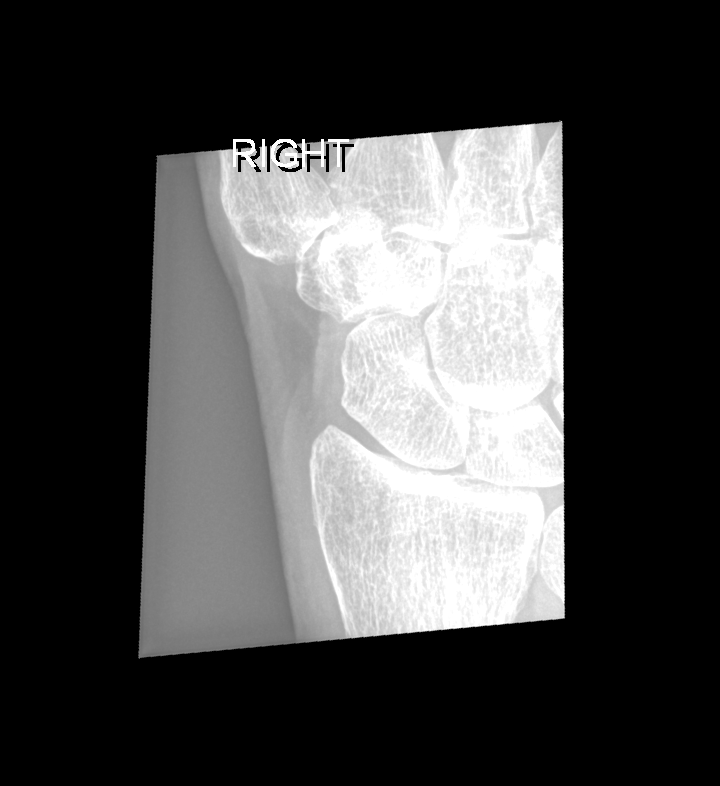

[4 of 4 positions shown; findings below may reference images not displayed]

FINDINGS: No fracture or dislocation is seen.

The joint spaces are preserved.

Visualized soft tissues are within normal limits.
IMPRESSION: Negative.

## 2020-03-24 IMAGING — DX DG HIP (WITH OR WITHOUT PELVIS) 2-3V RIGHT
3 series · 3 of 3 positions shown · non-contrast
Comparison: 07/26/2019, 01/23/2019

CLINICAL DATA: 87 y.o. female,w hypertension, ? Rheumatoid
arthritis (found in [REDACTED] [REDACTED]), osteoporosis, scoliosis, chronic
back pain, h/o PE (?? In our medical records in [facility] but can't find
CTA chest or in [REDACTED] everywhere), history of cellulitis.

EXAM:
DG HIP (WITH OR WITHOUT PELVIS) 2-3V RIGHT

[pelvis ap]
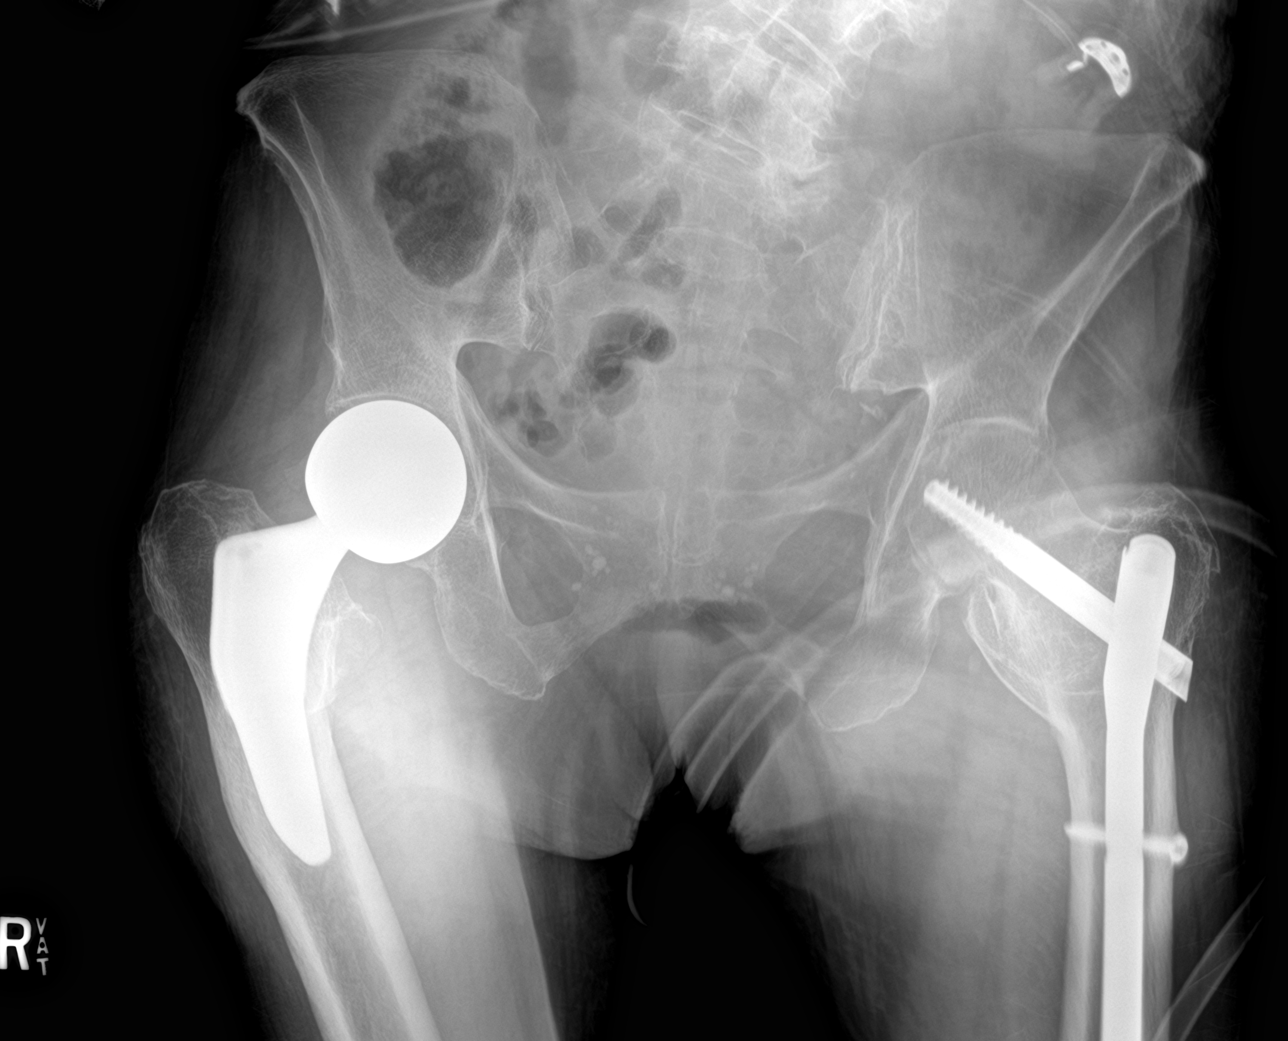

[hip ap]
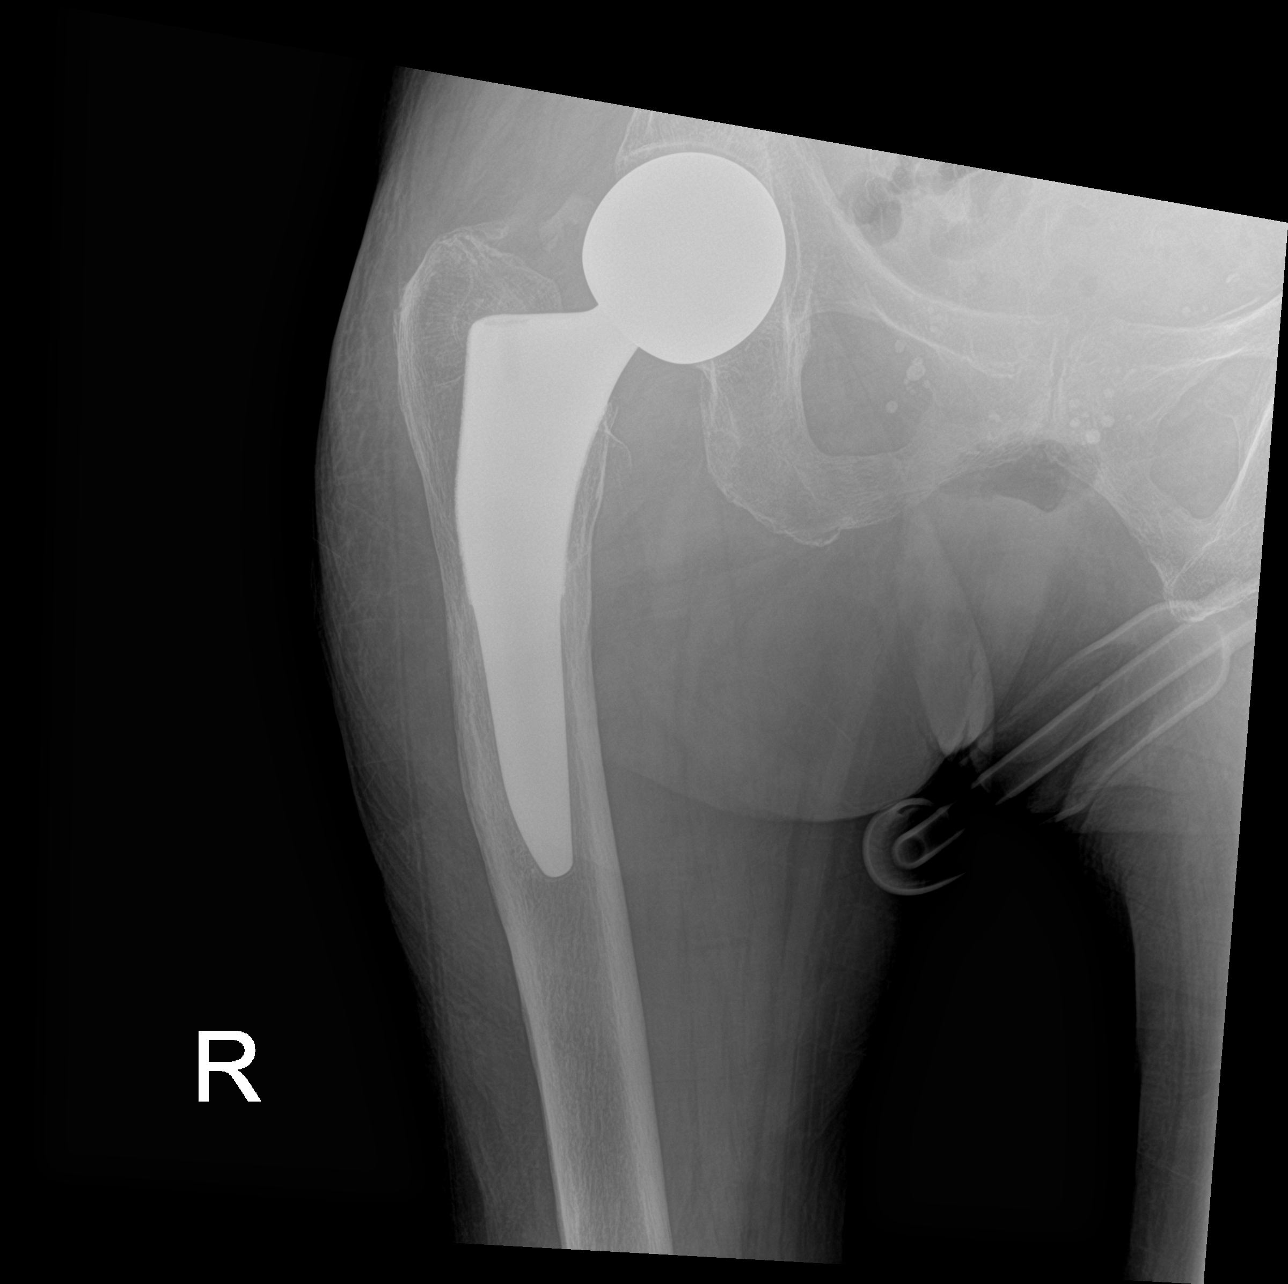

[hip lat]
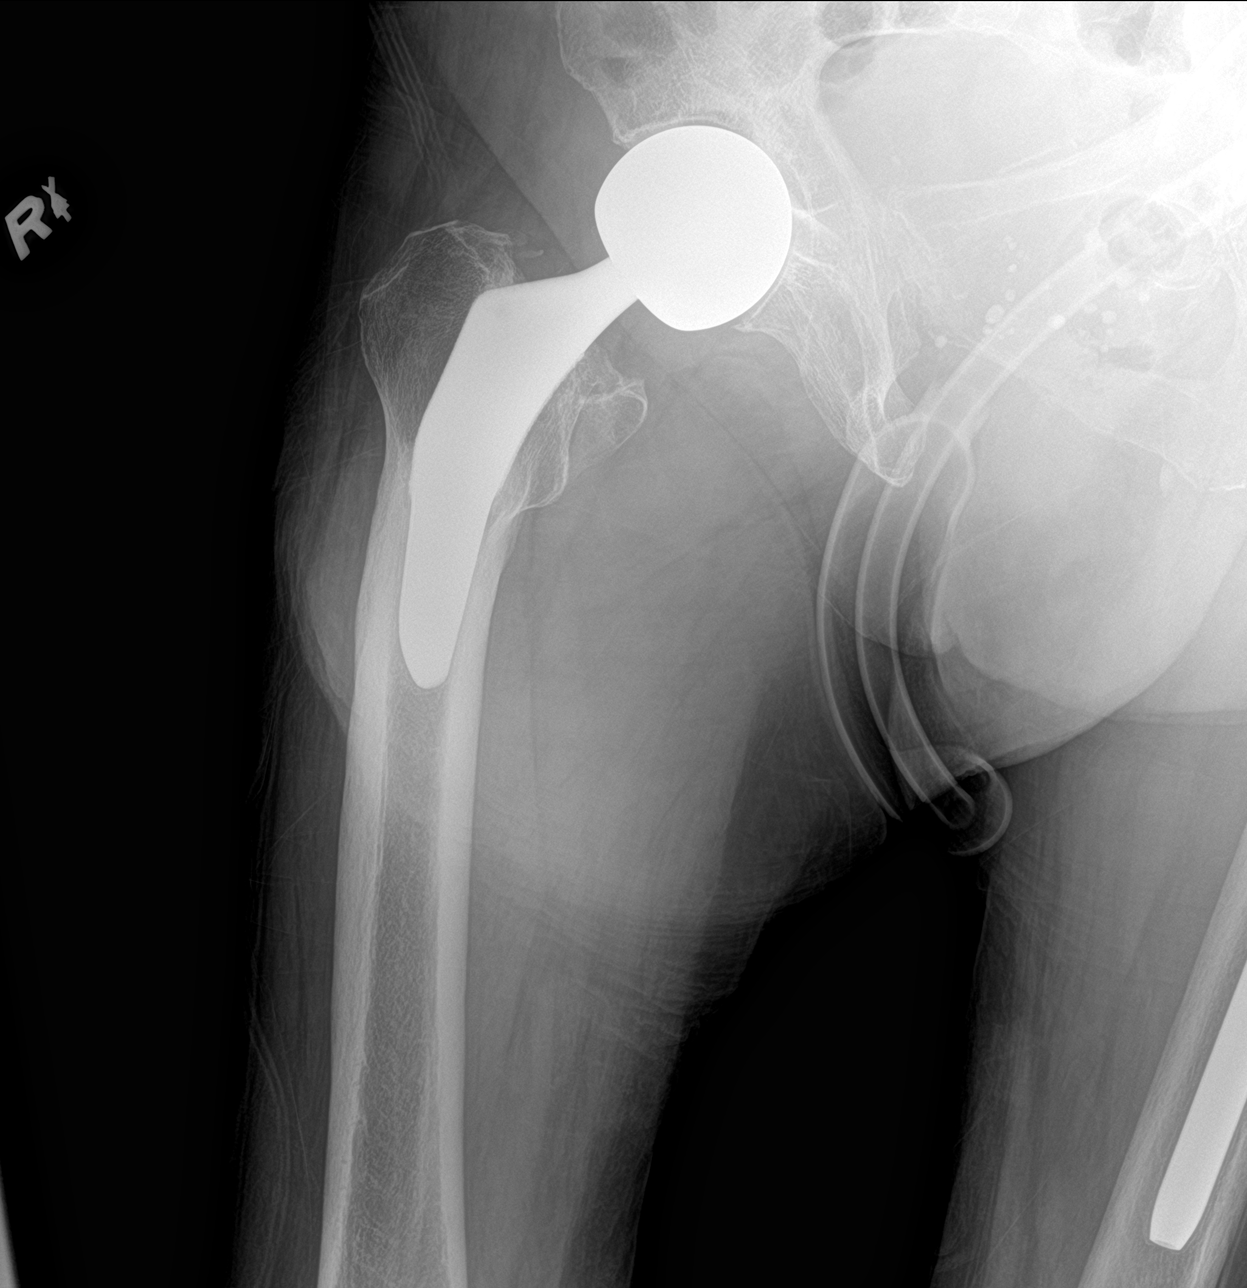

[3 of 3 positions shown; findings below may reference images not displayed]

FINDINGS: Status post ORIF of the LEFT hip. Patient has had RIGHT hip
arthroplasty. The hardware is intact. No evidence for acute fracture
or subluxation. Degenerative changes are seen in the spine.
IMPRESSION: 1. No evidence for acute abnormality.
2. Hardware intact.

## 2020-03-24 IMAGING — DX RIGHT ANKLE - COMPLETE 3+ VIEW
3 series · 3 of 3 positions shown · non-contrast
Comparison: None.

CLINICAL DATA: 87 y.o. female,w hypertension, ? Rheumatoid
arthritis (found in [REDACTED] [REDACTED]), osteoporosis, scoliosis, chronic
back pain, h/o PE (?? In our medical records in [facility] but can't find
CTA chest or in [REDACTED] everywhere) , h/o cellulitis.

EXAM:
RIGHT ANKLE - COMPLETE 3+ VIEW

[ankle ap]
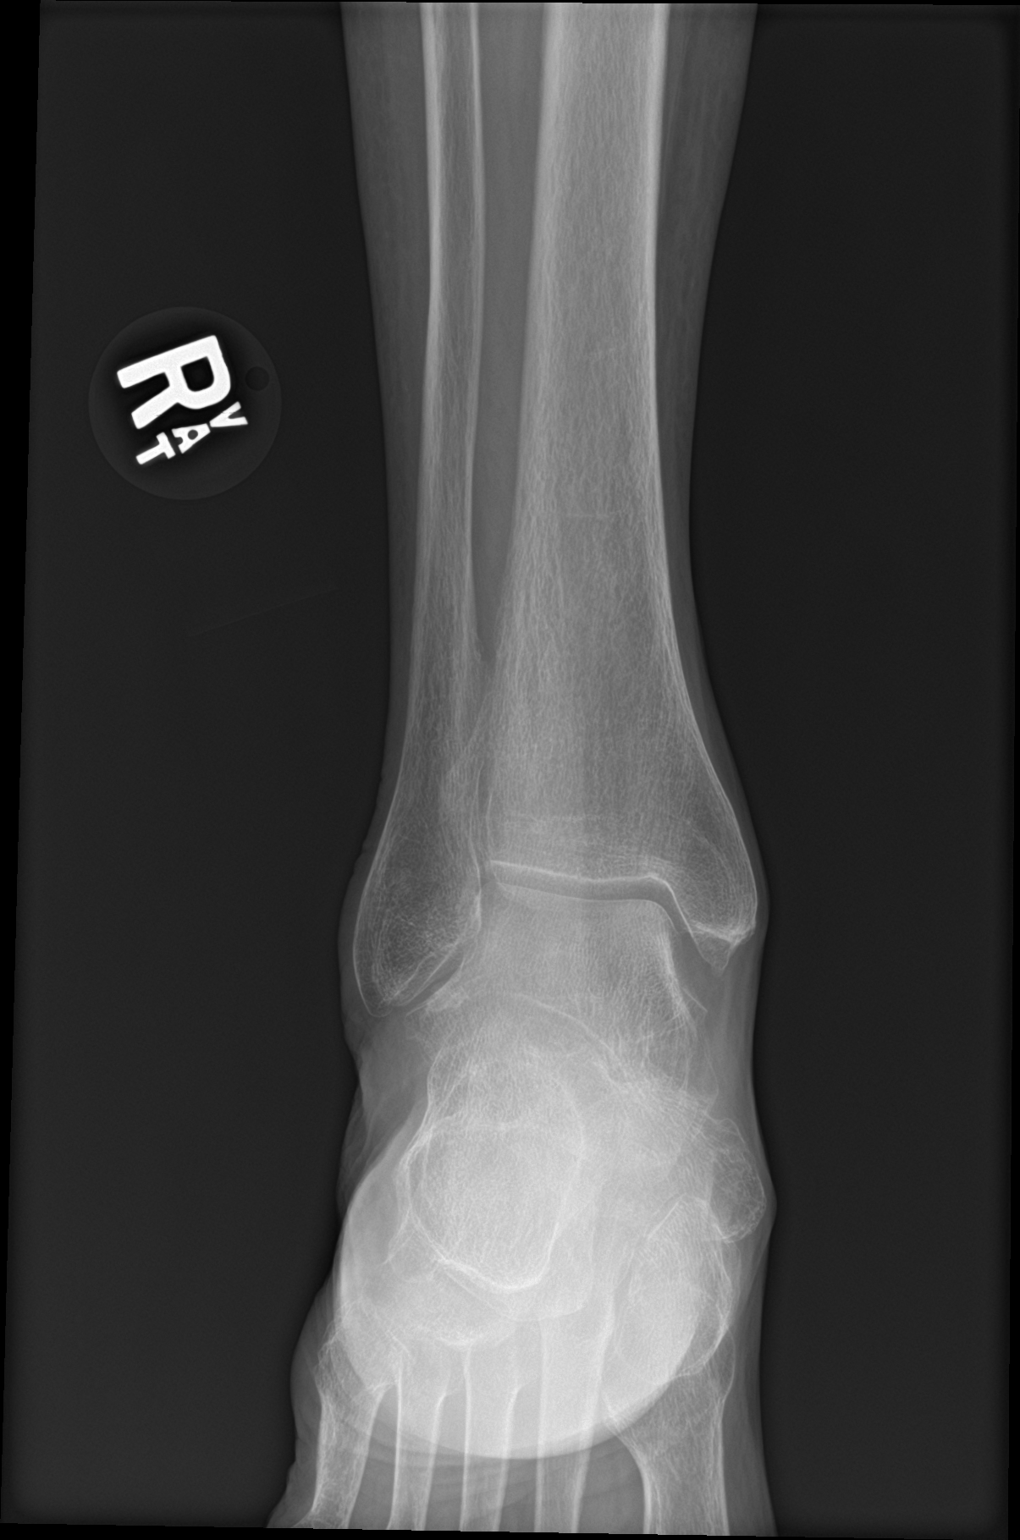

[ankle obl]
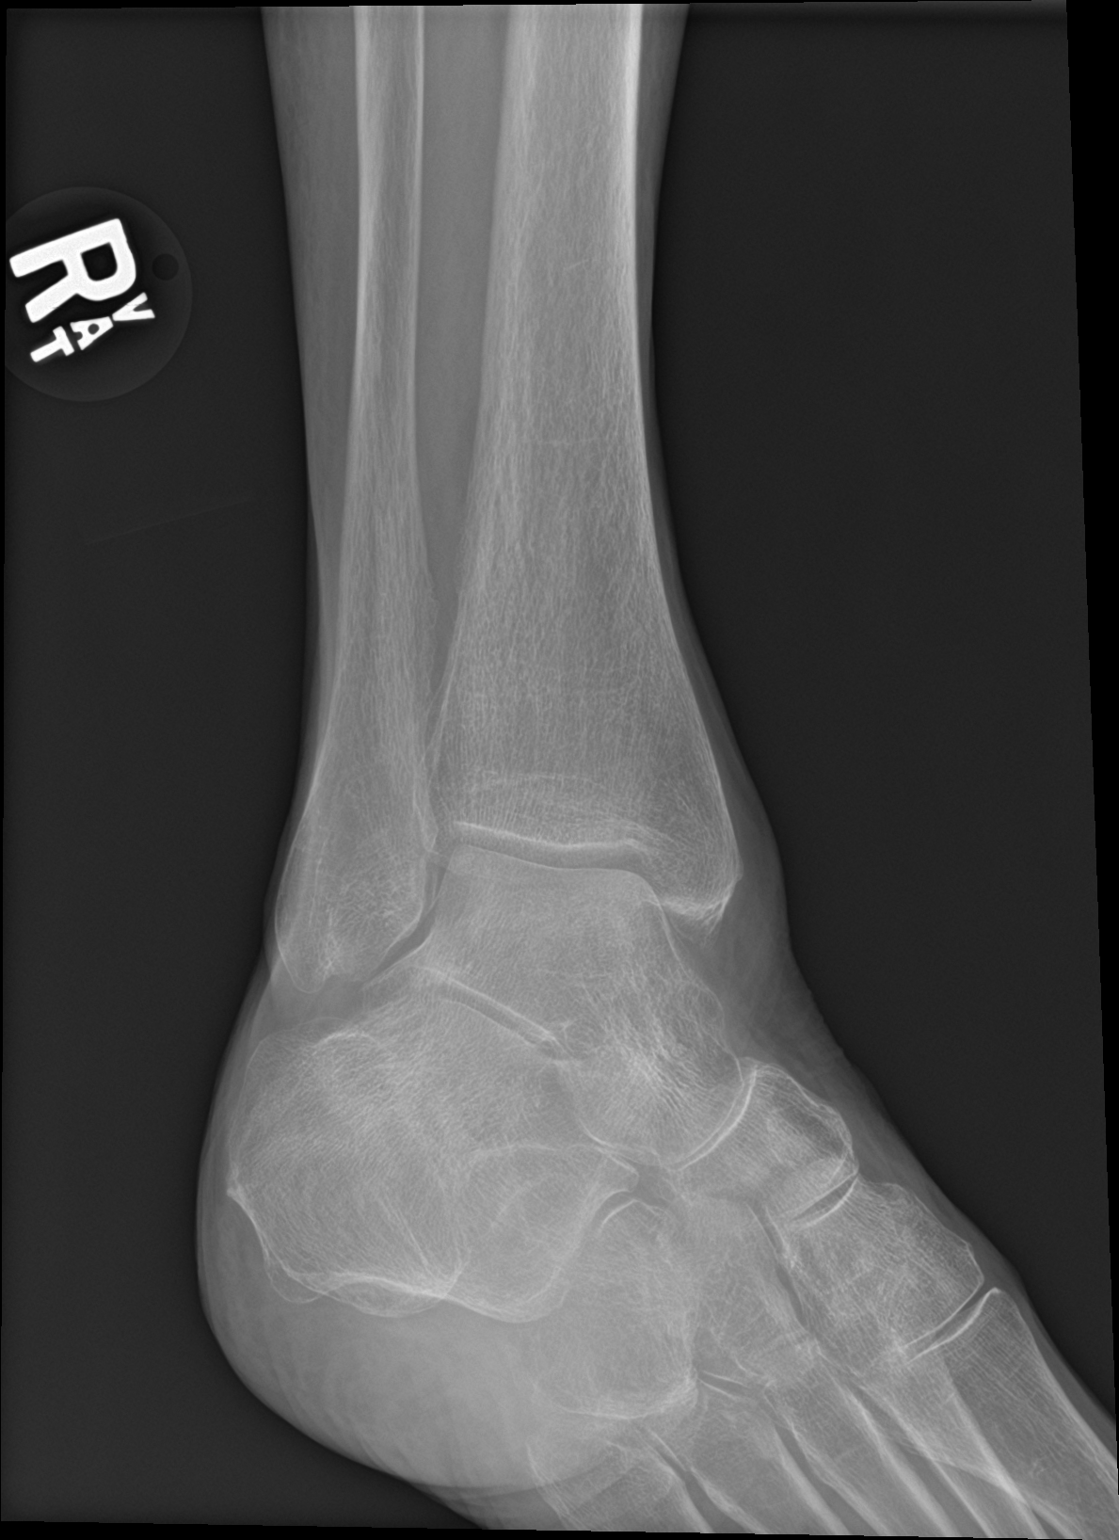

[ankle lat]
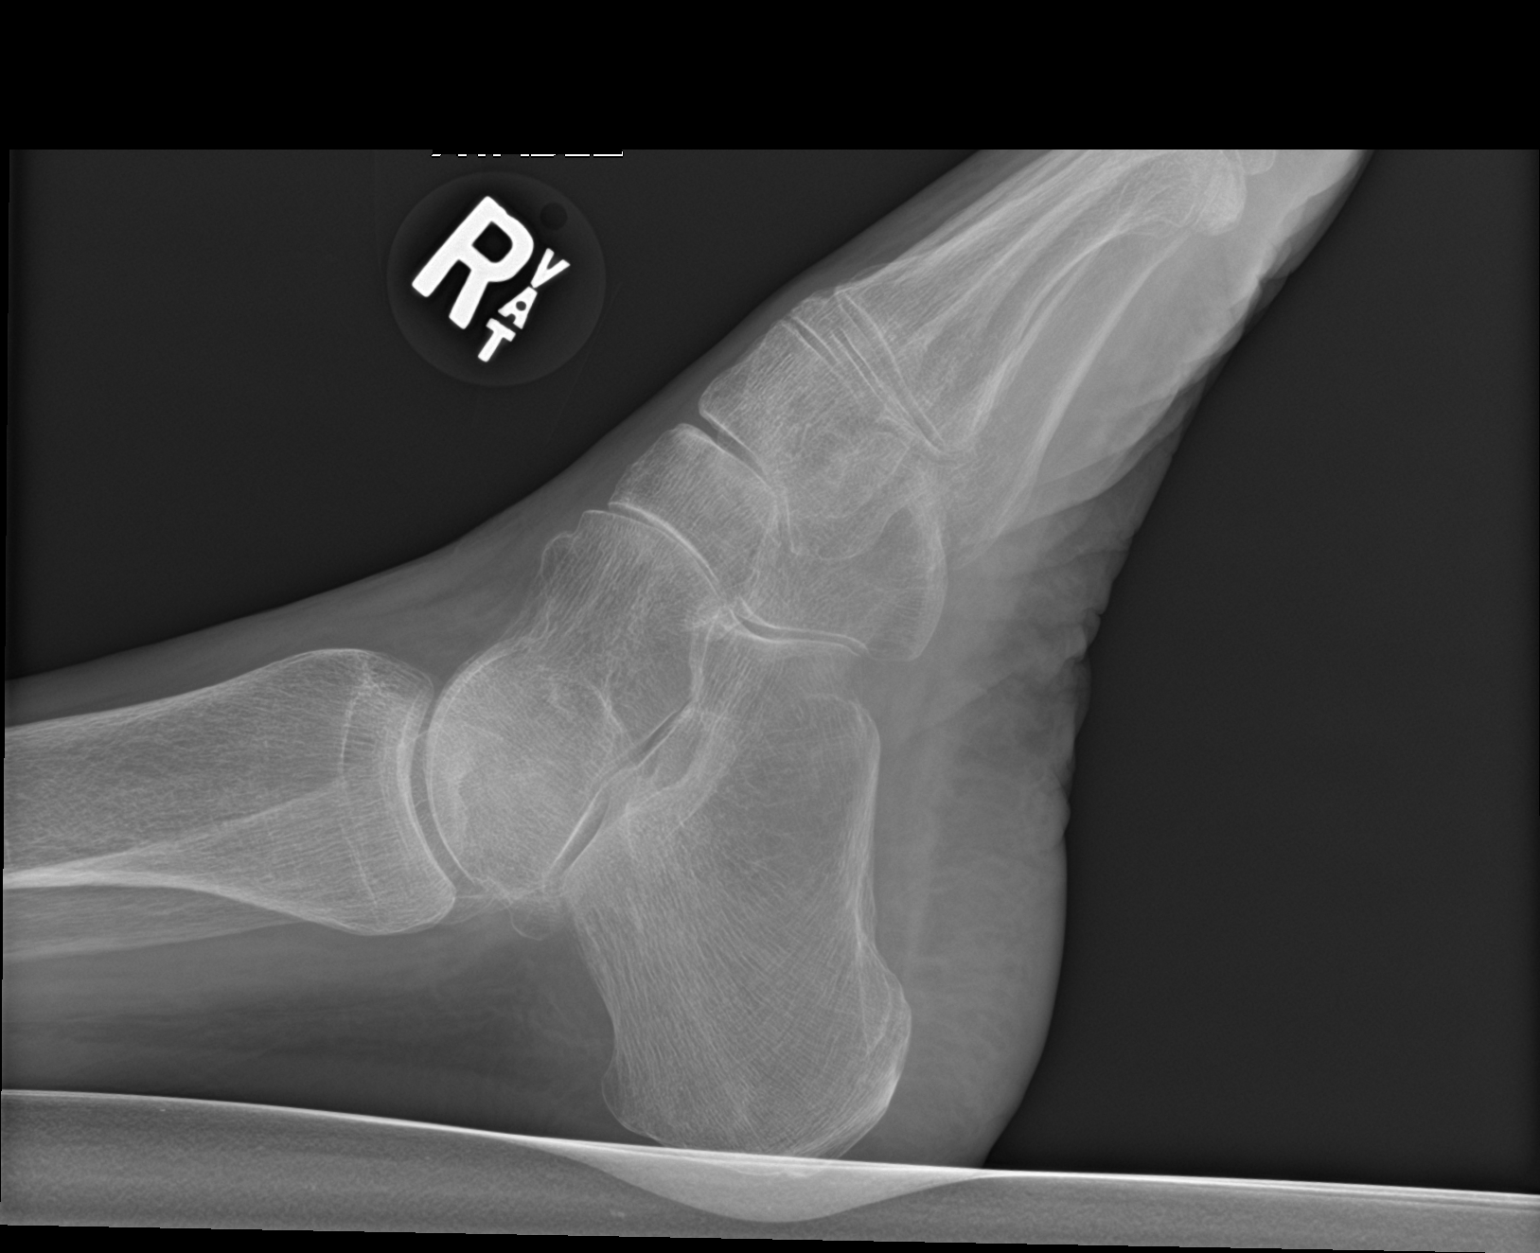

[3 of 3 positions shown; findings below may reference images not displayed]

FINDINGS: There is no evidence of fracture, dislocation, or joint effusion.
There is no evidence of arthropathy or other focal bone abnormality.
Soft tissues are unremarkable.
IMPRESSION: Negative.

## 2020-03-24 IMAGING — US ULTRASOUND ABDOMEN COMPLETE
1 series · 14 of 25 positions shown · non-contrast
Comparison: None.

CLINICAL DATA: Abnormal liver function tests.

EXAM:
ABDOMEN ULTRASOUND COMPLETE

[Series 1: ultrasound abdomen complete · 14 of 53 slices shown]
[im 1/53]
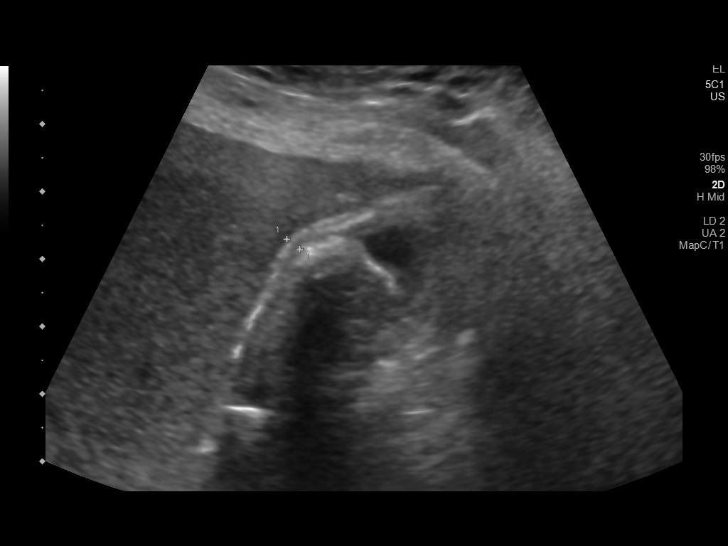
[im 5/53]
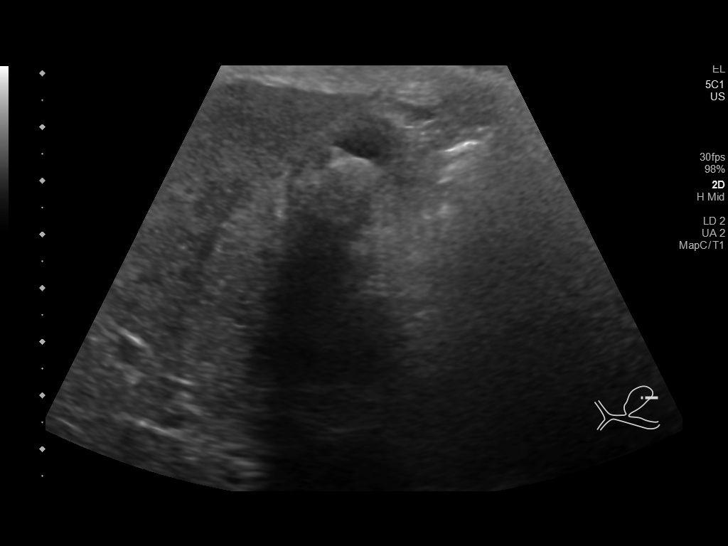
[im 9/53]
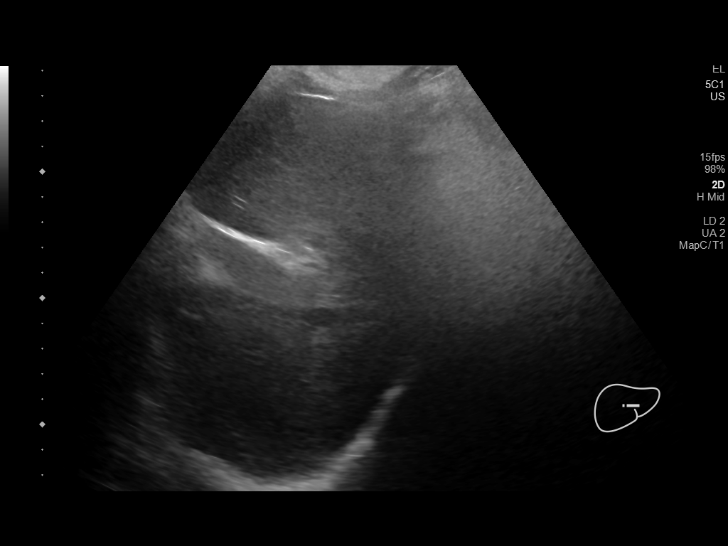
[im 14/53]
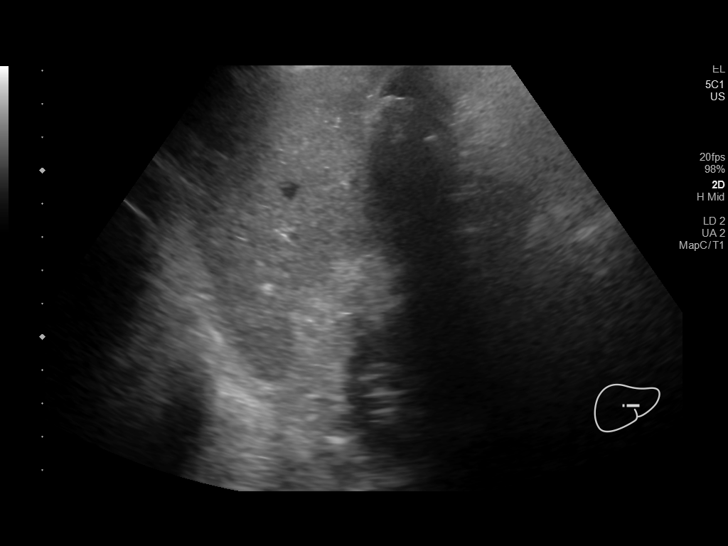
[im 18/53]
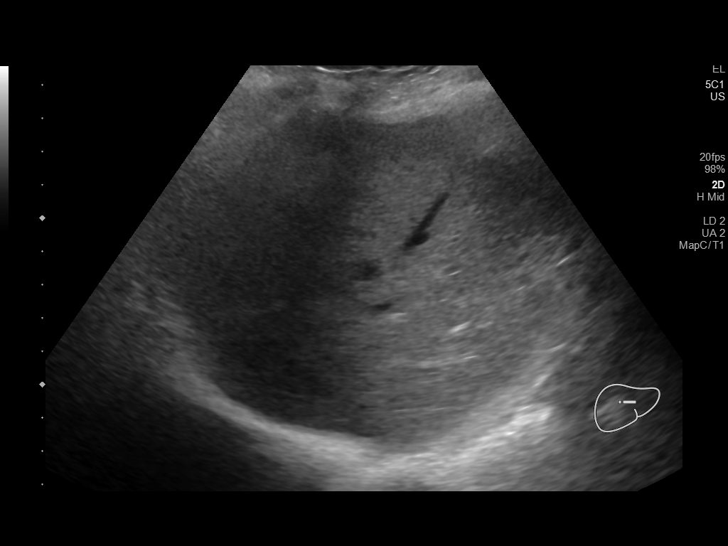
[im 20/53]
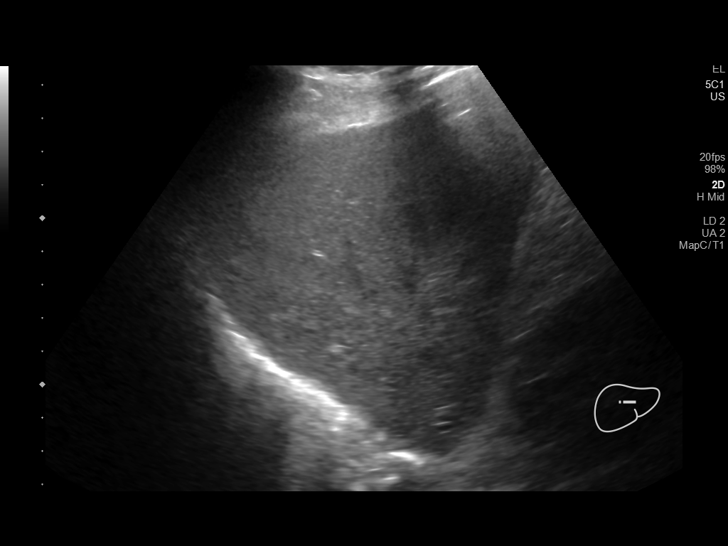
[im 24/53]
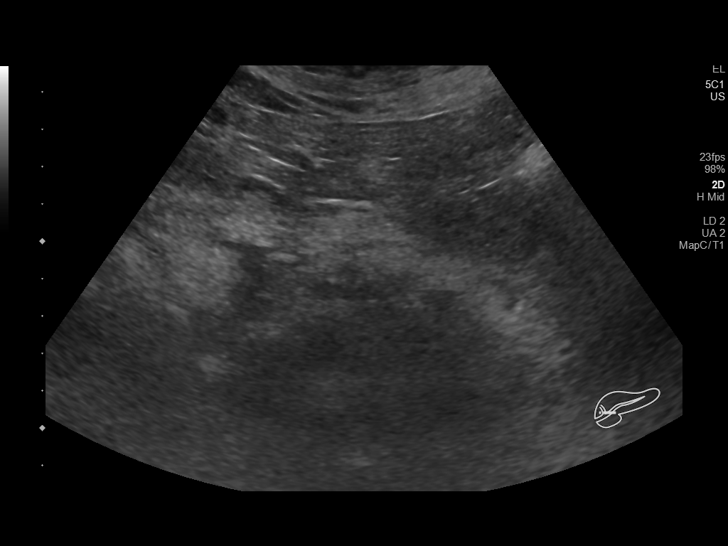
[im 29/53]
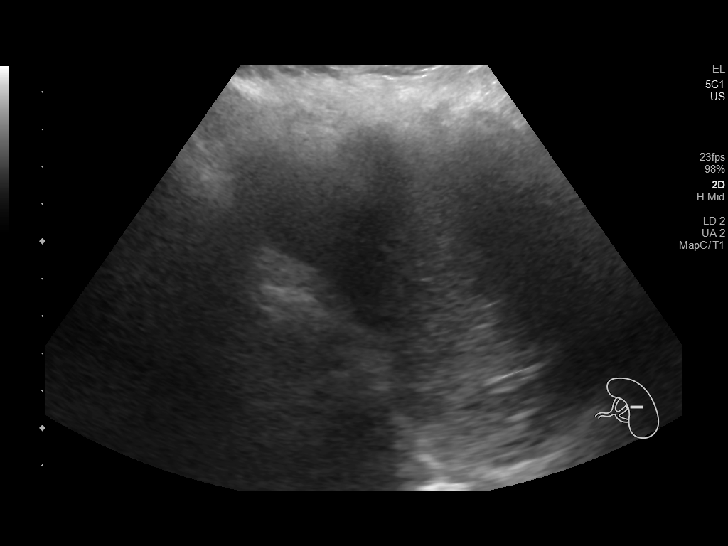
[im 33/53]
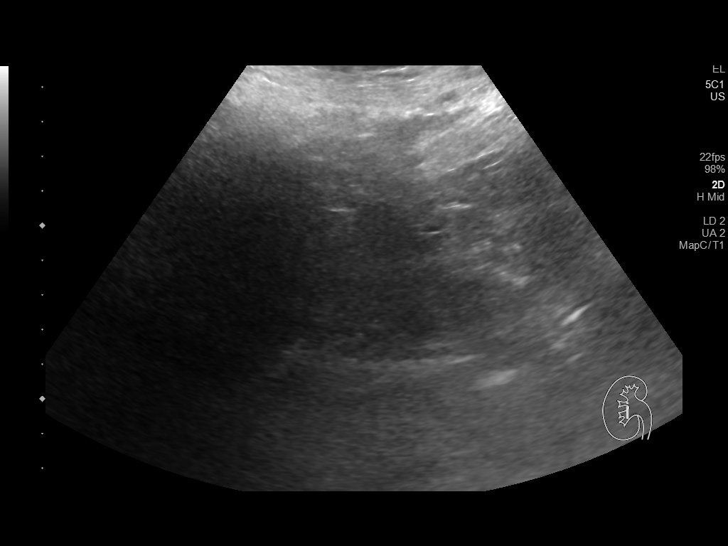
[im 35/53]
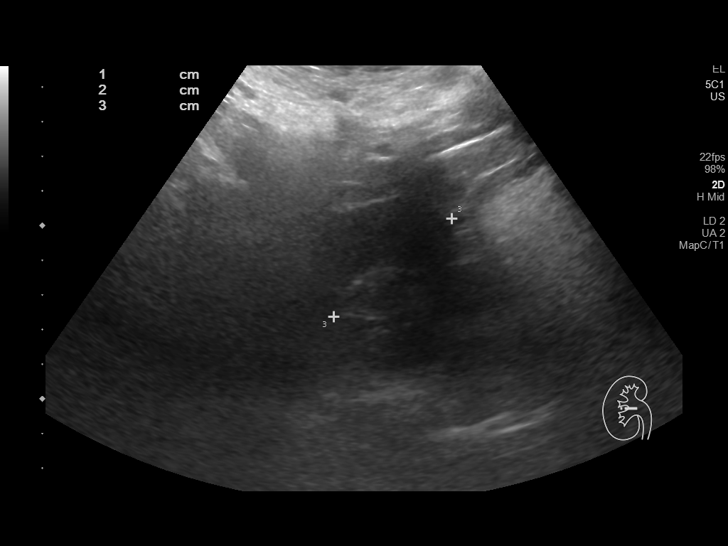
[im 40/53]
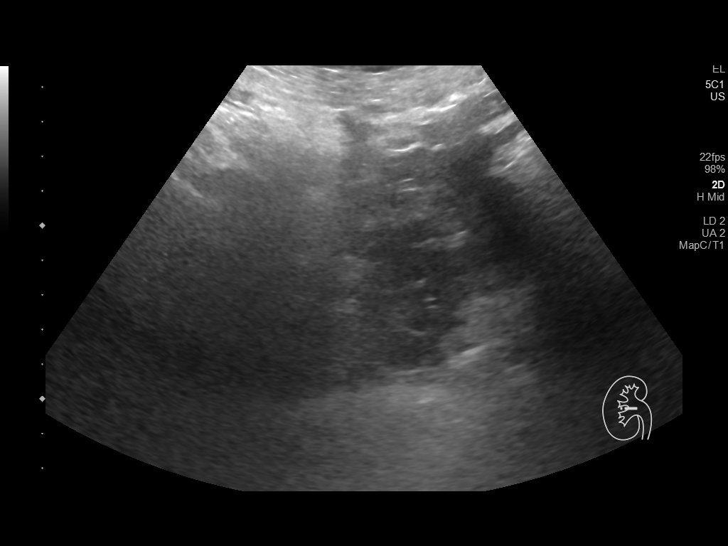
[im 44/53]
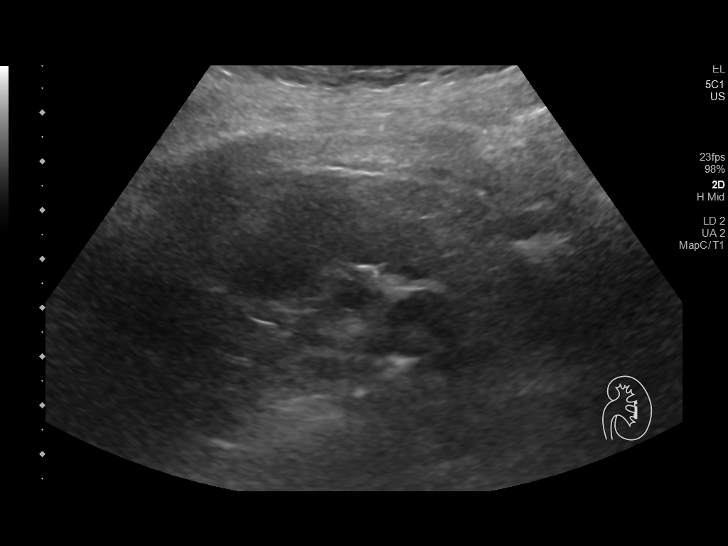
[im 48/53]
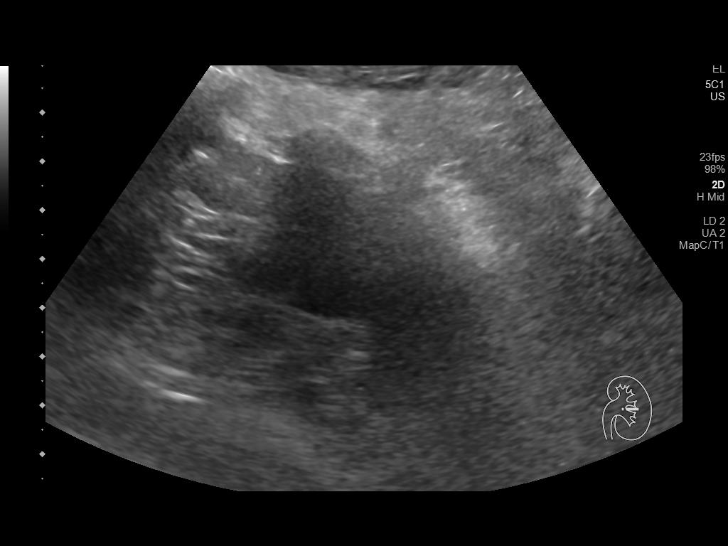
[im 53/53]
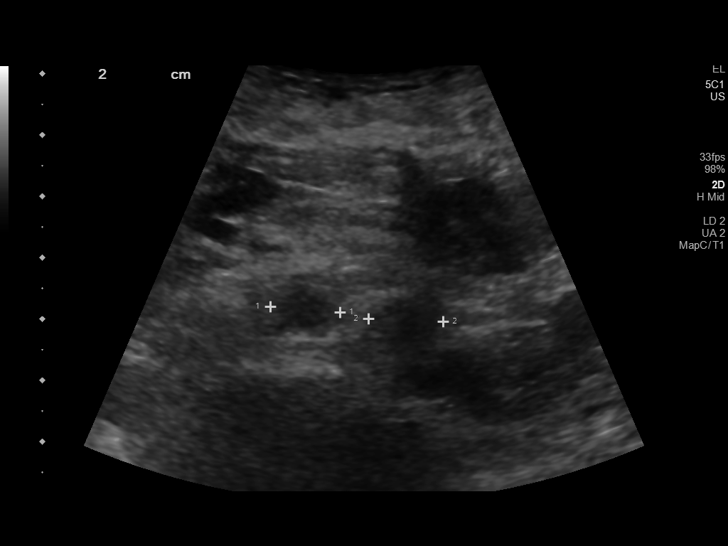

[14 of 25 positions shown; findings below may reference images not displayed]

FINDINGS: Gallbladder: Stones almost completely filling the gallbladder.
Discrete measurement is difficult but stones measure up to
approximately 1.5 cm in diameter. There is no wall thickening or
pericholecystic fluid.

Common bile duct: Diameter: 0.3 cm

Liver: No focal lesion identified. Within normal limits in
parenchymal echogenicity. Portal vein is patent on color Doppler
imaging with normal direction of blood flow towards the liver.

IVC: No abnormality visualized.

Pancreas: Visualized portion unremarkable.

Spleen: Size and appearance within normal limits.

Right Kidney: Length: 9.4 cm. Echogenicity within normal limits. No
mass or hydronephrosis visualized.

Left Kidney: Length: 8 cm. Echogenicity within normal limits. No
mass or hydronephrosis visualized.

Abdominal aorta: No aneurysm visualized.

Other findings: None.
IMPRESSION: Gallstones without evidence of cholecystitis. The exam is otherwise
negative.

## 2020-03-24 IMAGING — CR RIGHT FEMUR 2 VIEWS
4 series · 4 of 4 positions shown · non-contrast
Comparison: None.

CLINICAL DATA: Fall, bilateral leg pain

EXAM:
RIGHT FEMUR 2 VIEWS

[t femur proximal lat right]
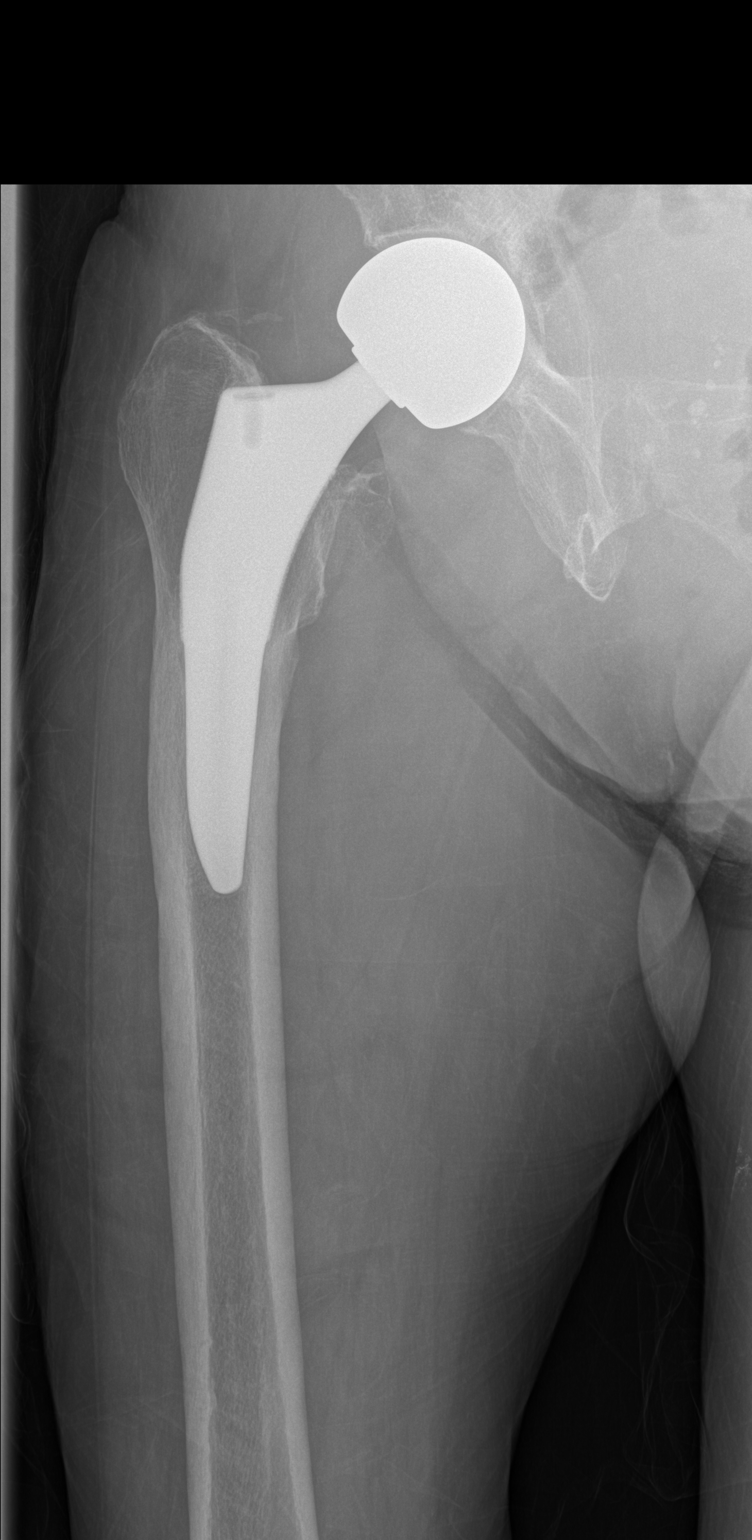

[t femur distal ap right]
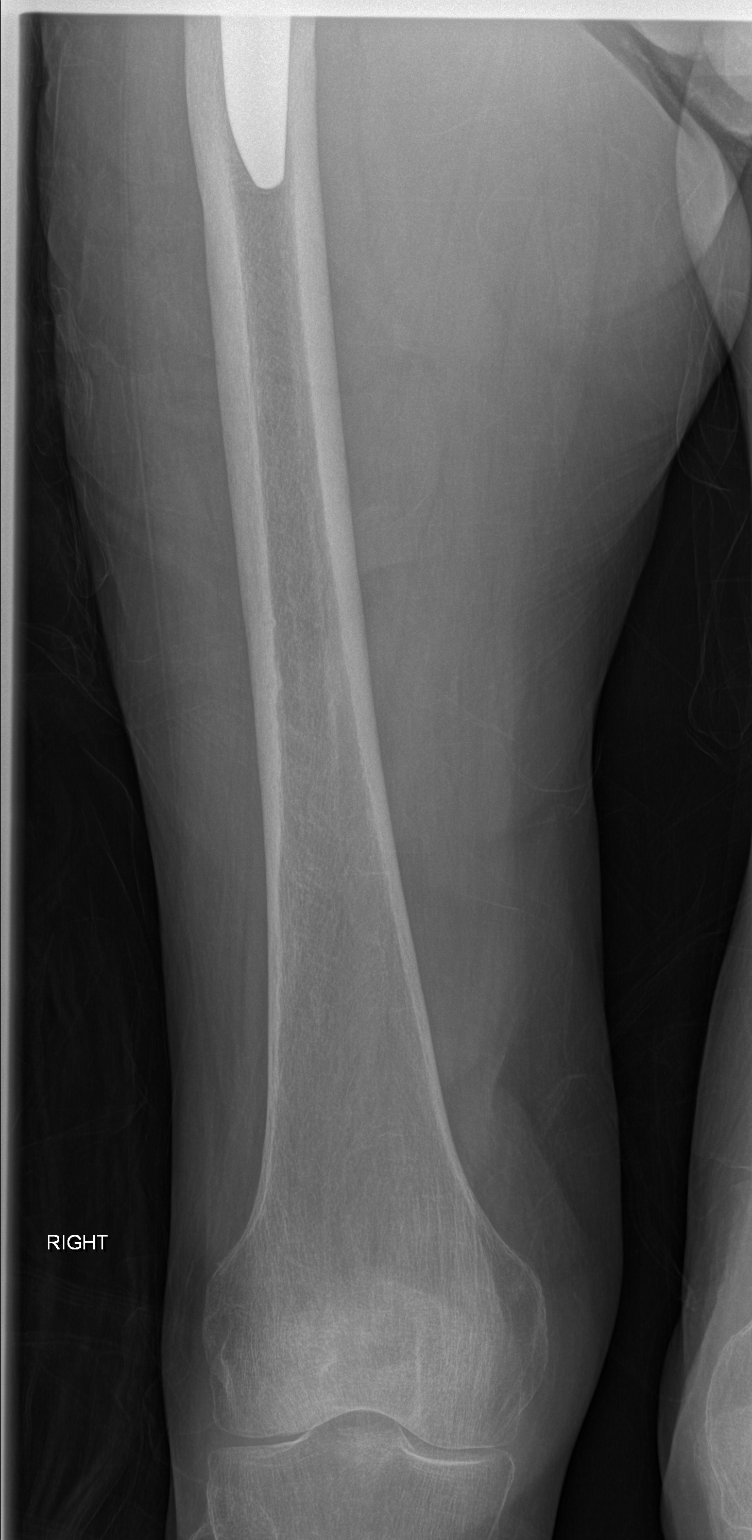

[w femur distal lat right]
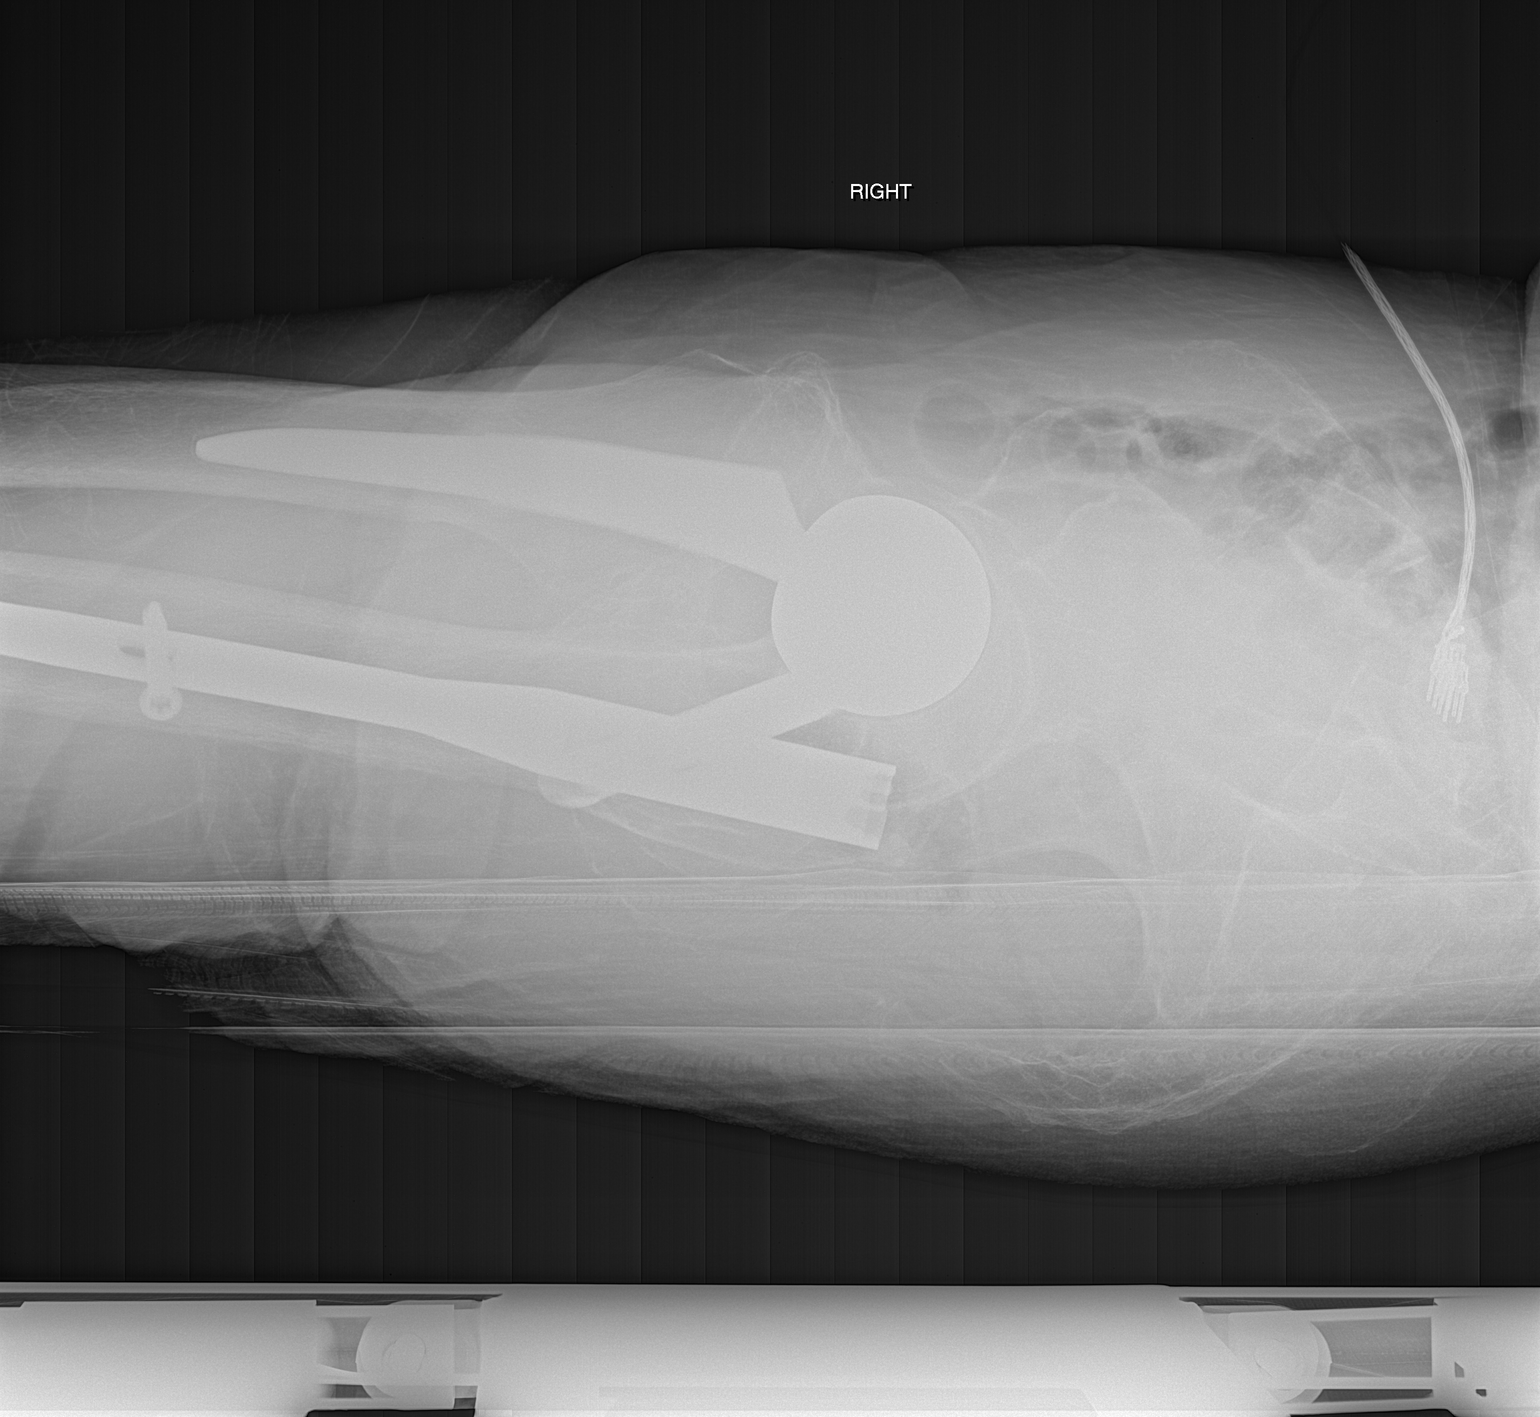

[x femur distal ap right]
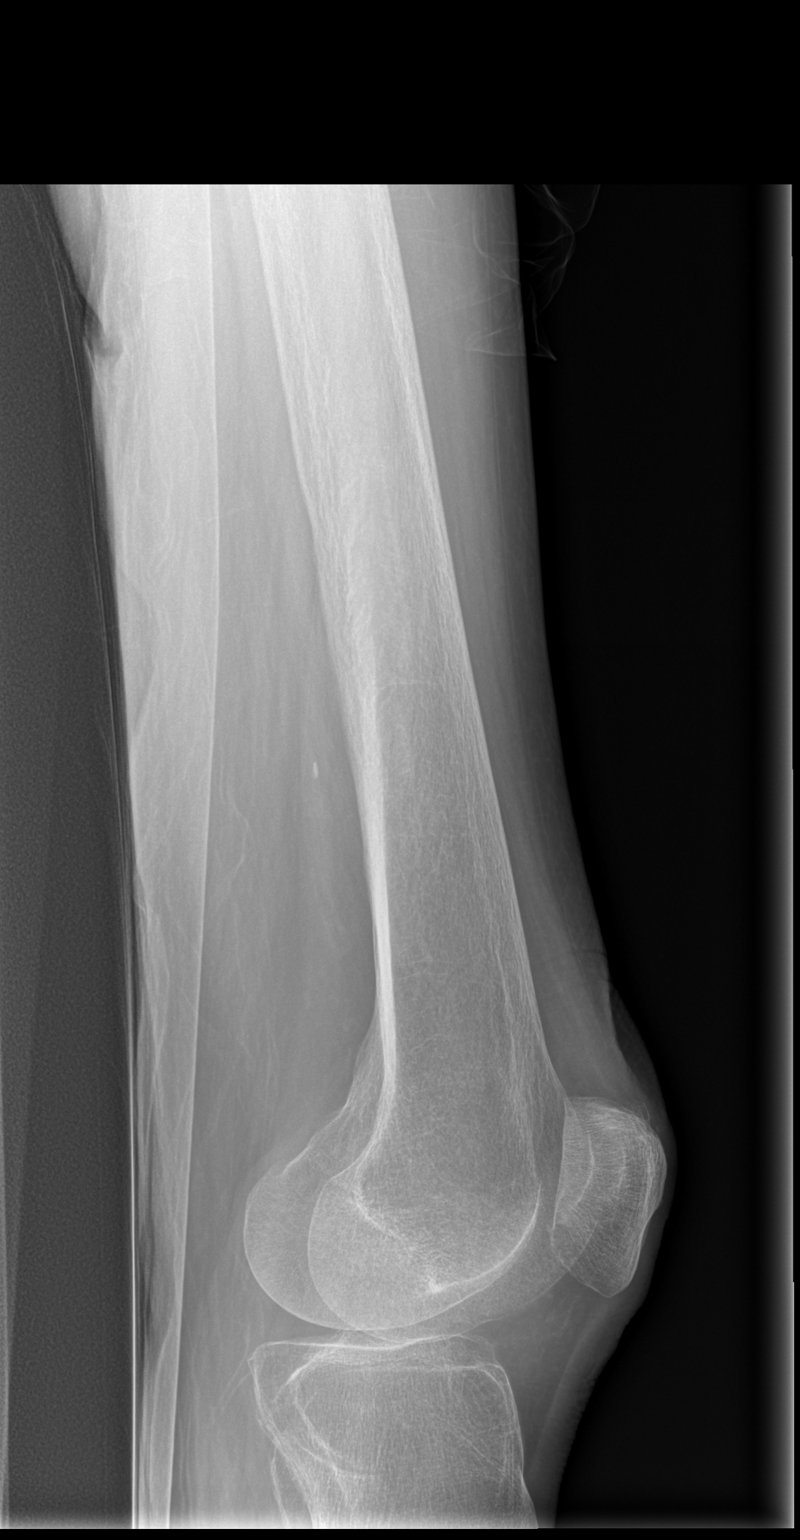

[4 of 4 positions shown; findings below may reference images not displayed]

FINDINGS: Status post right hip hemiarthroplasty. No acetabular component. No
evidence of complication.

No evidence of fracture or dislocation.

Distal femur is unremarkable.
IMPRESSION: Status post right hip hemiarthroplasty, without evidence of
complication.

## 2020-03-24 IMAGING — CR CHEST - 2 VIEW
2 series · 2 of 2 positions shown · non-contrast
Comparison: 04/22/2018

CLINICAL DATA: Fall

EXAM:
CHEST - 2 VIEW

[w chest lat]
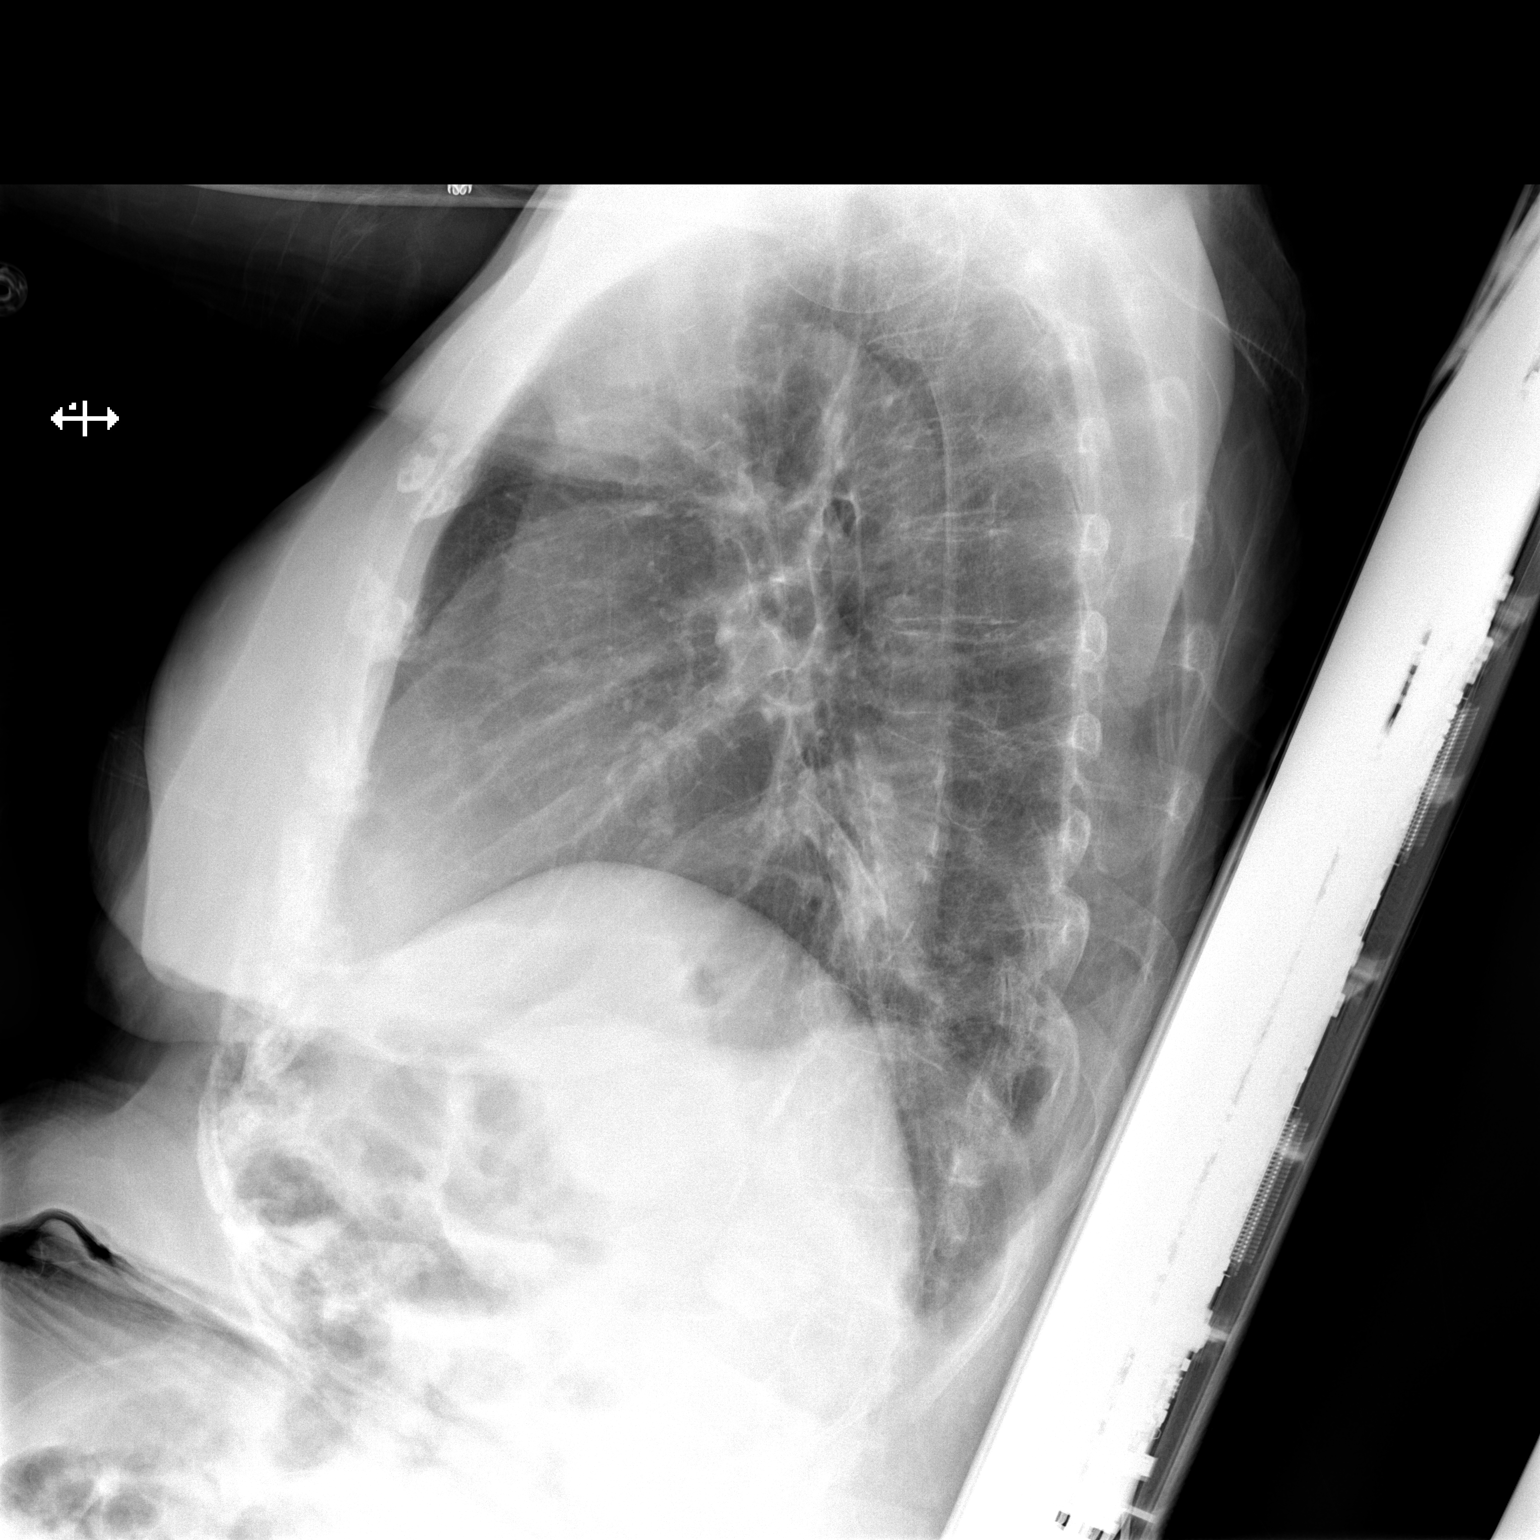

[x chest ap]
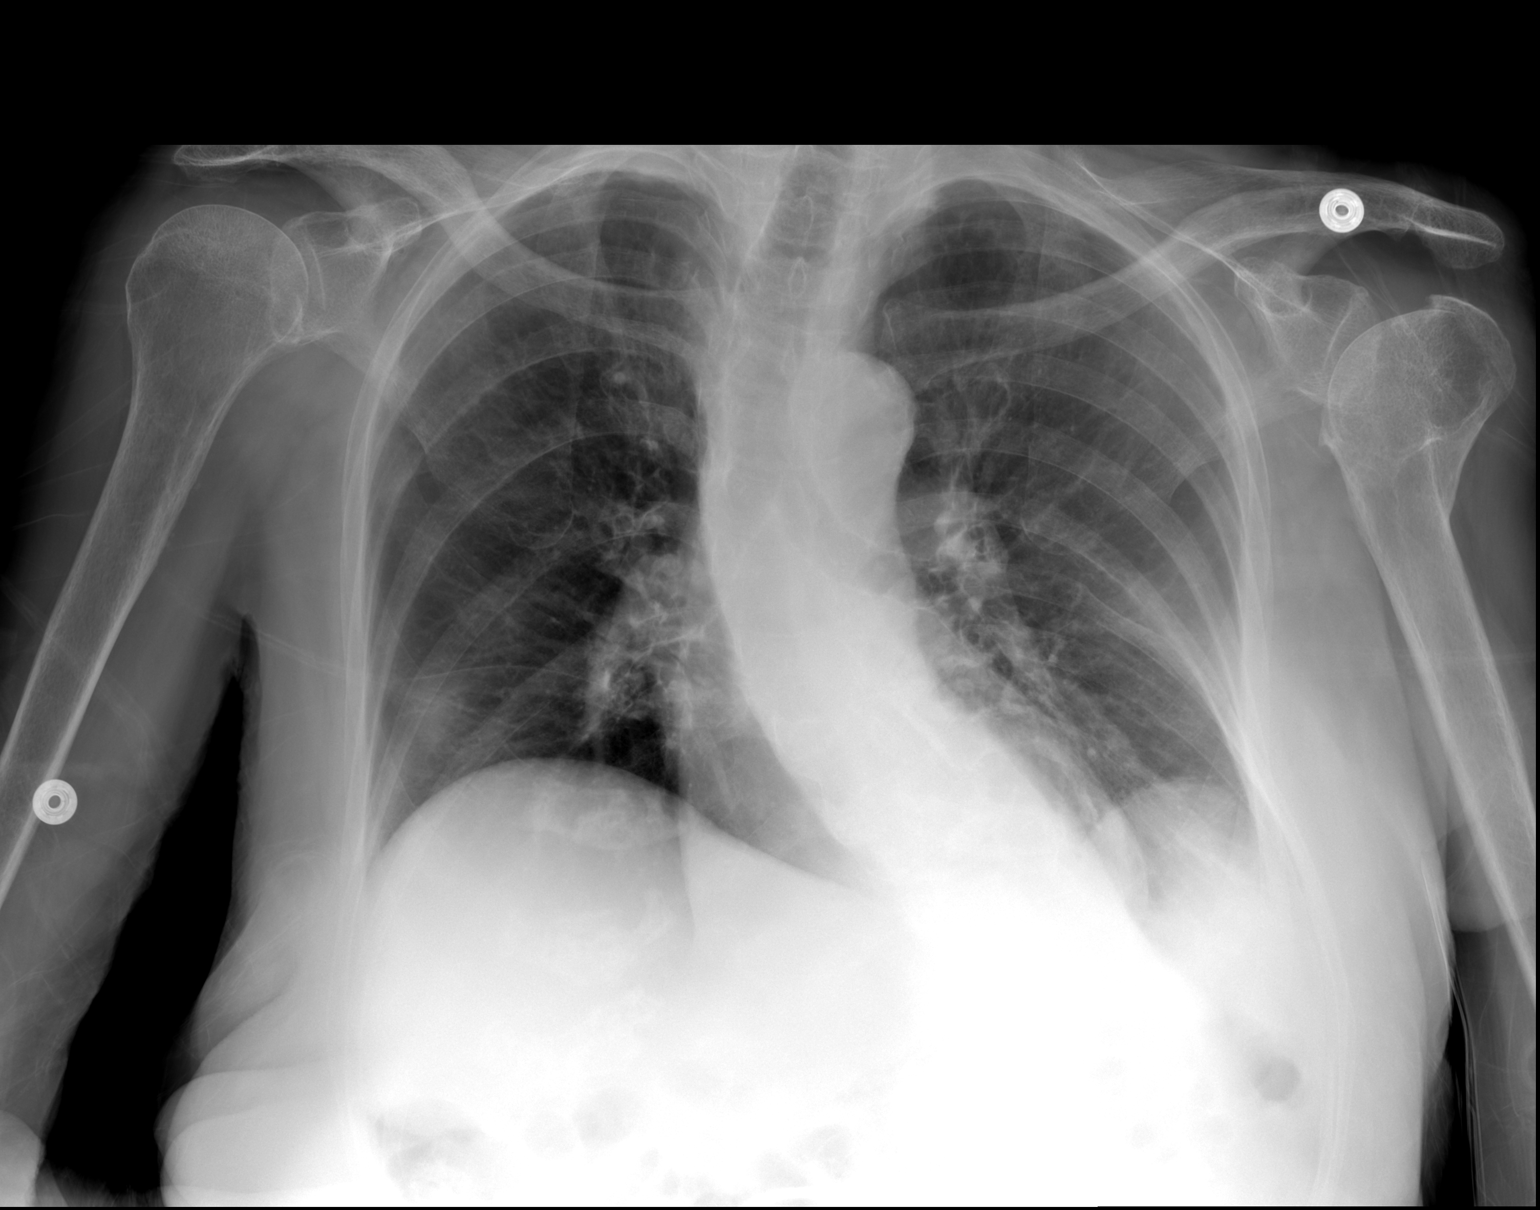

[2 of 2 positions shown; findings below may reference images not displayed]

FINDINGS: Lungs are clear.  No pleural effusion or pneumothorax.

The heart is normal in size.

S shaped thoracolumbar scoliosis.

Posttraumatic deformity involving the left humeral head/neck,
chronic.
IMPRESSION: No evidence of acute cardiopulmonary disease.

## 2020-03-25 IMAGING — MR MRI HEAD WITHOUT CONTRAST
10 of 12 series · 40 of 48 positions shown · non-contrast
Comparison: CT same day

CLINICAL DATA: Worsening confusion over the last few months.
Ataxia.

EXAM:
MRI HEAD WITHOUT CONTRAST
TECHNIQUE: Multiplanar, multiecho pulse sequences of the brain and surrounding
structures were obtained without intravenous contrast.

[Series 3: DWI · axial · 3.0mm · 1.09mm/px · z∈[-50,+106]mm · 9 of 106 slices shown (1 of 6)]
[im 1/106]
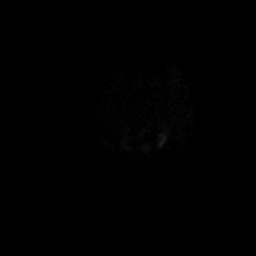
[im 14/106]
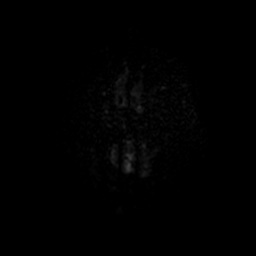
[im 27/106]
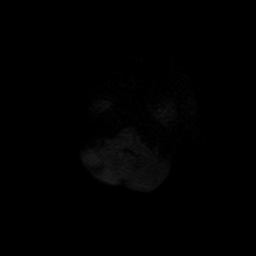
[im 40/106]
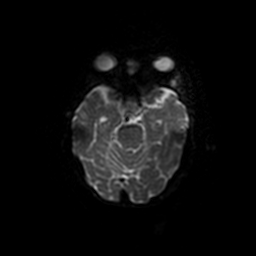
[im 53/106]
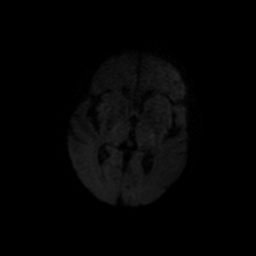
[im 66/106]
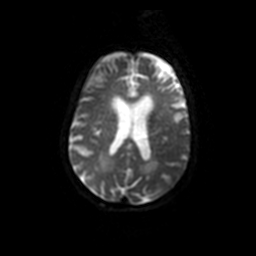
[im 79/106]
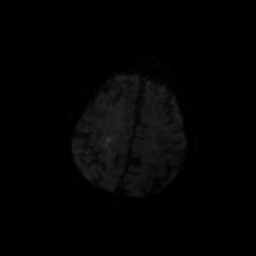
[im 92/106]
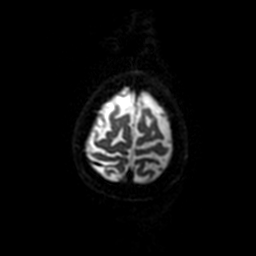
[im 106/106]
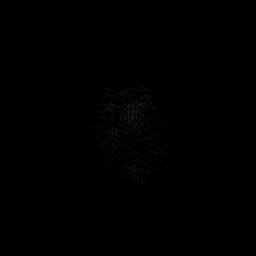

[Series 4: T1 · sagittal · 5.0mm · 0.47mm/px · 2 of 21 slices shown (1 of 2)]
[im 1/21]
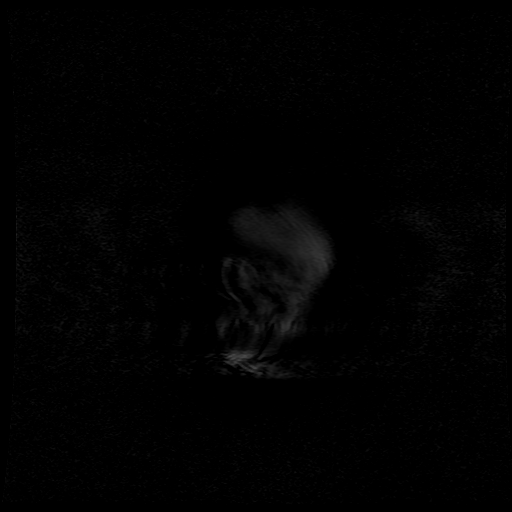
[im 21/21]
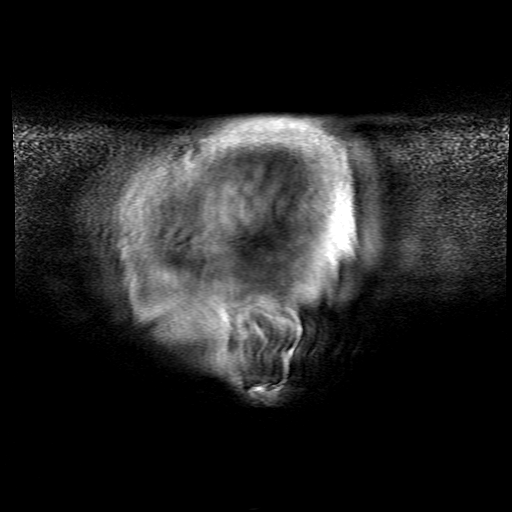

[Series 5: T2 · axial · 5.0mm · 0.43mm/px · z∈[-55,+101]mm · 2 of 27 slices shown]
[im 1/27]
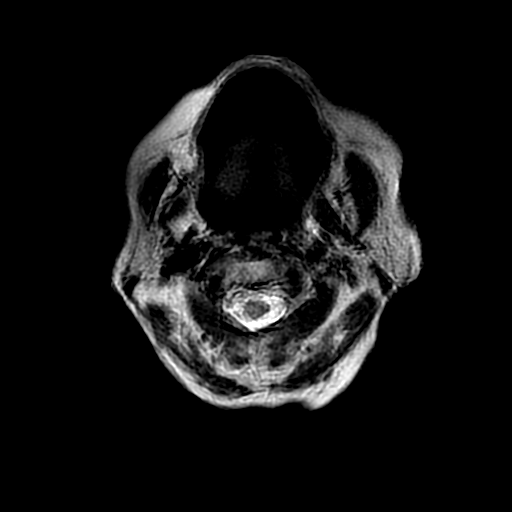
[im 27/27]
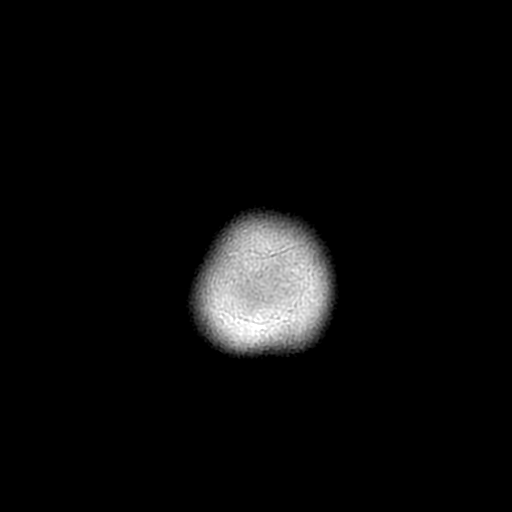

[Series 6: FLAIR · axial · 3.0mm · 0.43mm/px · z∈[-55,+101]mm · 2 of 27 slices shown]
[im 1/27]
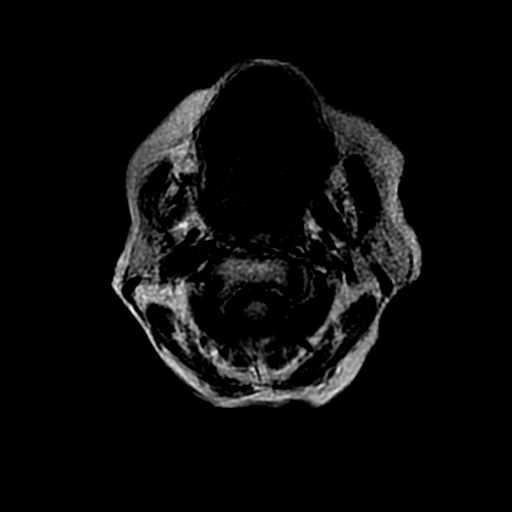
[im 27/27]
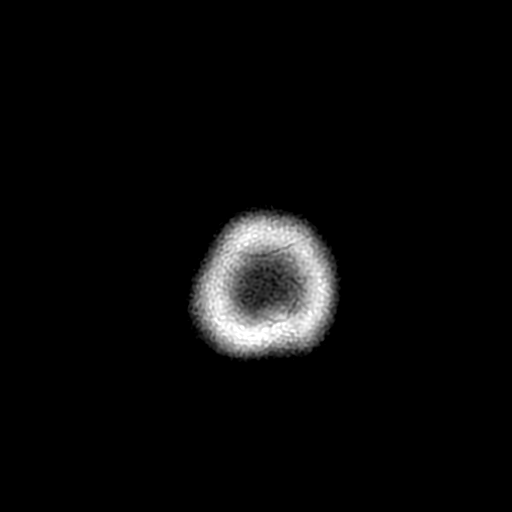

[Series 7: DWI · coronal · 5.0mm · 1.09mm/px · 5 of 58 slices shown (2 of 6)]
[im 1/58]
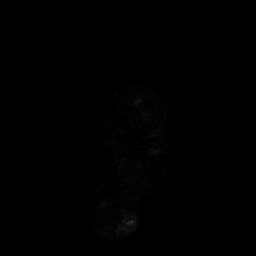
[im 15/58]
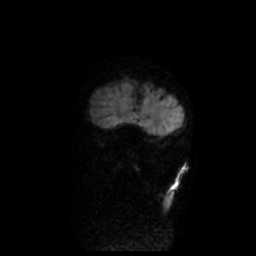
[im 29/58]
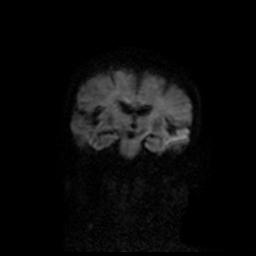
[im 43/58]
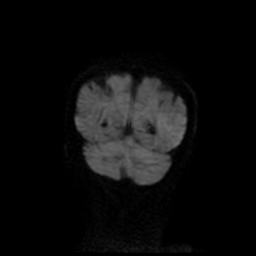
[im 58/58]
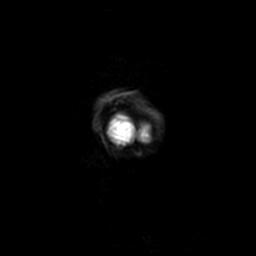

[Series 9: T1 · axial · 3.0mm · 0.47mm/px · z∈[-68,+19]mm · 3 of 60 slices shown (2 of 2)]
[im 1/60]
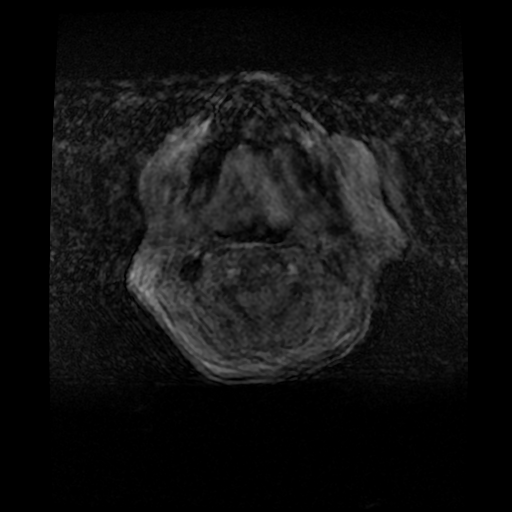
[im 15/60]
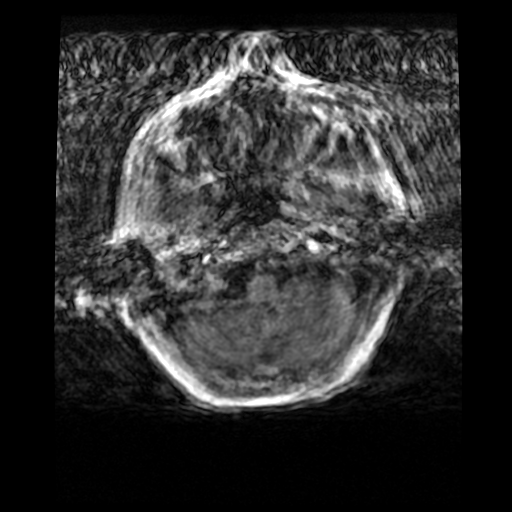
[im 30/60]
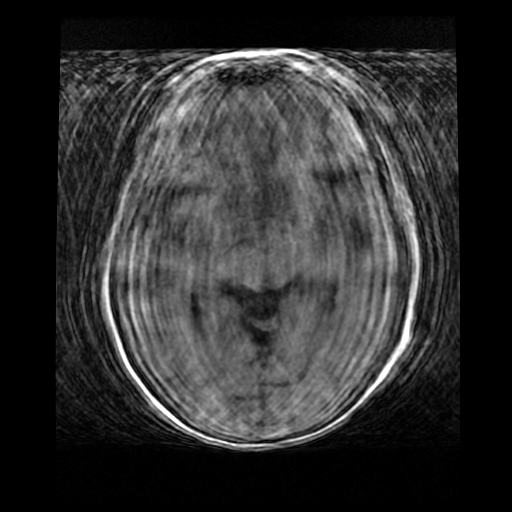

[Series 11: DWI · axial · 4.0mm · 1.17mm/px · z∈[-55,+102]mm · 6 of 72 slices shown (3 of 6)]
[im 1/72]
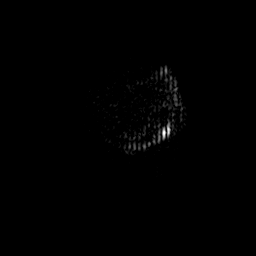
[im 15/72]
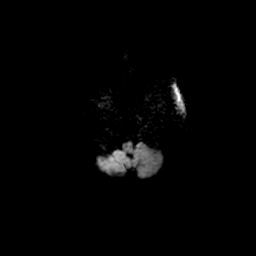
[im 29/72]
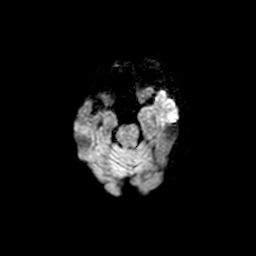
[im 43/72]
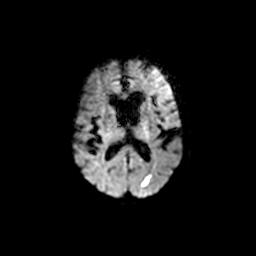
[im 57/72]
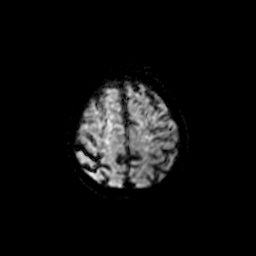
[im 72/72]
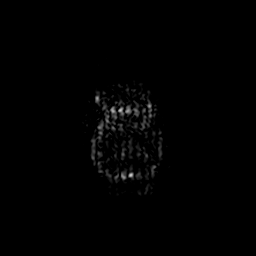

[Series 300: DWI · axial · 3.0mm · 1.09mm/px · z∈[-50,+106]mm · 5 of 53 slices shown (4 of 6)]
[im 1/53]
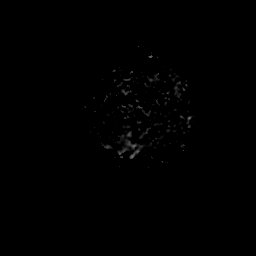
[im 14/53]
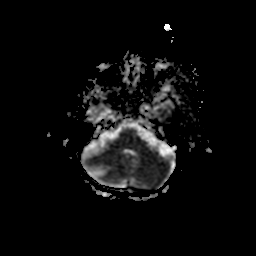
[im 27/53]
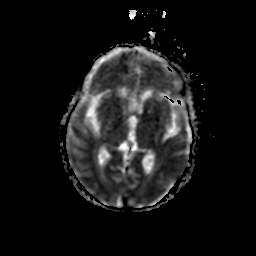
[im 40/53]
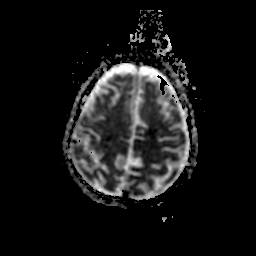
[im 53/53]
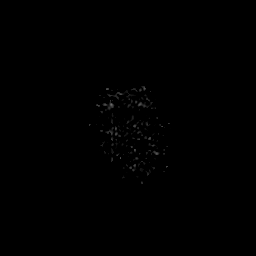

[Series 700: DWI · coronal · 5.0mm · 1.09mm/px · 3 of 29 slices shown (5 of 6)]
[im 1/29]
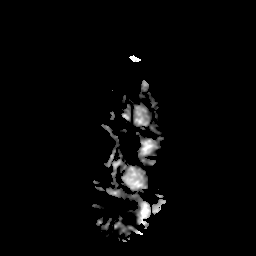
[im 15/29]
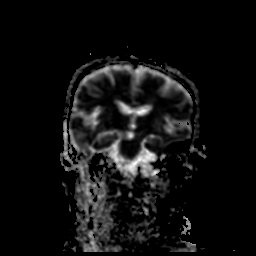
[im 29/29]
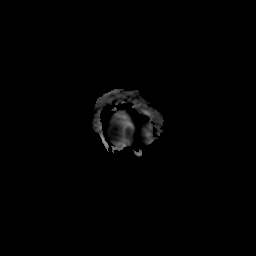

[Series 1100: DWI · axial · 4.0mm · 1.17mm/px · z∈[-55,+102]mm · 3 of 36 slices shown (6 of 6)]
[im 1/36]
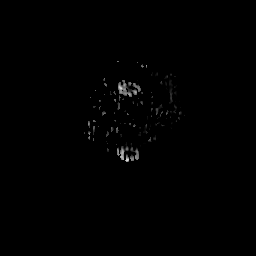
[im 18/36]
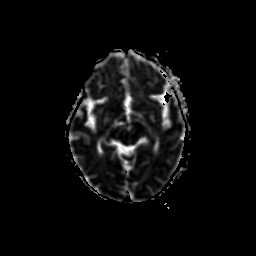
[im 36/36]
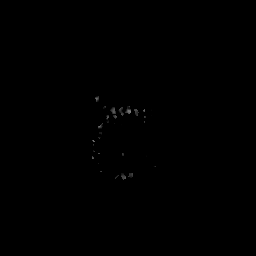

[40 of 48 positions shown; findings below may reference images not displayed]

FINDINGS: Brain: There is an old small vessel infarction in the right
cerebellum. Mild chronic small-vessel ischemic change affects the
pons. There is a background pattern of chronic small vessel disease
throughout the cerebral hemispheric white matter. There is scattered
foci of acute infarction noted, within the right frontoparietal
vertex deep white matter, the left occipital cortical brain and
subcortical white matter, and the left parietal white matter.
Pattern is most consistent with hypoperfusion infarctions/watershed
infarctions. Micro embolic infarctions are possible, but I would
expect more cortical involvement. The differential diagnosis does
include other white matter processes such as MOOLMAN and inflammatory
processes such as Lyme disease. No sign of mass lesion, hemorrhage,
hydrocephalus or extra-axial collection.

Vascular: Major vessels at the base of the brain show flow.

Skull and upper cervical spine: Negative

Sinuses/Orbits: Clear/normal

Other: None
IMPRESSION: Background pattern of small-vessel disease with old small vessel
right cerebellar infarction and widespread white matter changes.
Scattered acute small infarctions primarily within the white matter
of the cerebral hemispheres but with some cortical involvement
particularly in the left occipital region. Favored diagnosis is
hypoperfusion or watershed infarctions. Embolic infarctions are
possible. Other processes including MOOLMAN and Lyme disease could give
this appearance but seem unlikely.

## 2020-03-25 IMAGING — CT CT HEAD WITHOUT CONTRAST
3 series · 16 of 46 positions shown, 19 images · non-contrast
Comparison: CT head 01/23/2019

CLINICAL DATA: Rule out cerebral hemorrhage. Hypertension.
Recurrent falls

EXAM:
CT HEAD WITHOUT CONTRAST
TECHNIQUE: Contiguous axial images were obtained from the base of the skull
through the vertex without intravenous contrast.

[Series 2: head wo · axial · 0.47mm/px · z∈[+1410,+1530]mm · 10 of 29 slices shown, 13 images]
[im 3/29  brain]
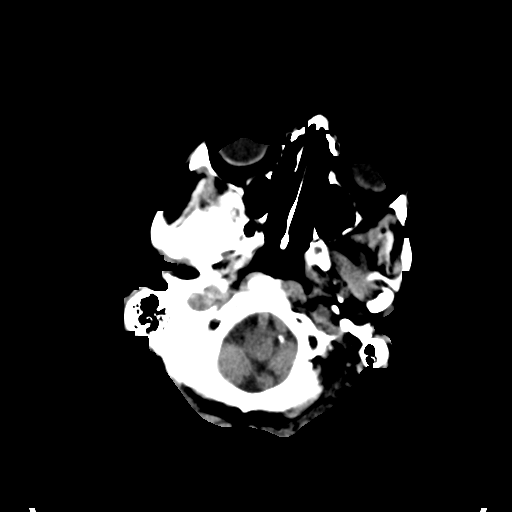
[im 3/29  bone]
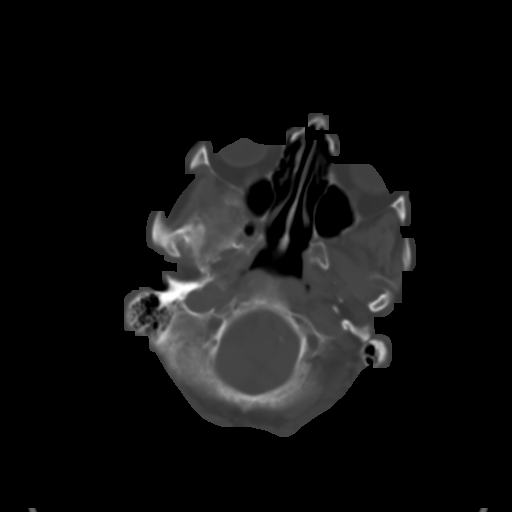
[im 6/29  brain]
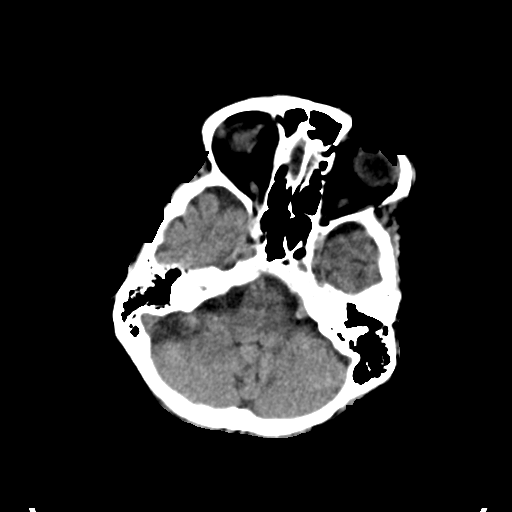
[im 8/29  brain]
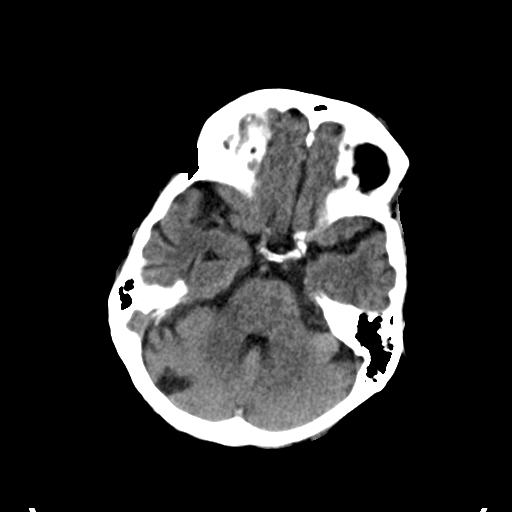
[im 11/29  brain]
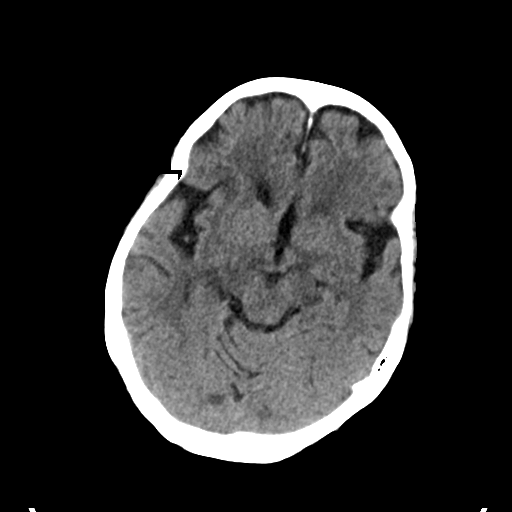
[im 14/29  brain]
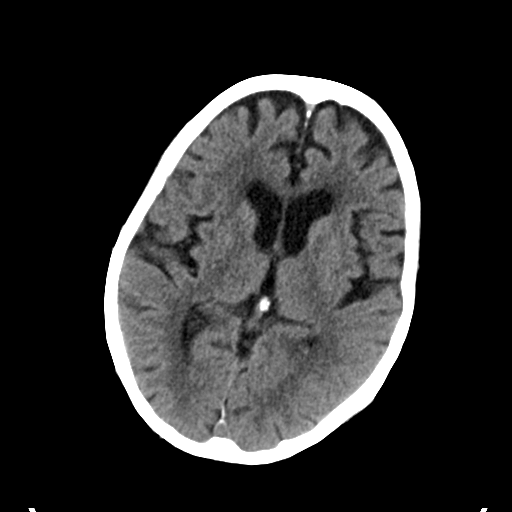
[im 14/29  bone]
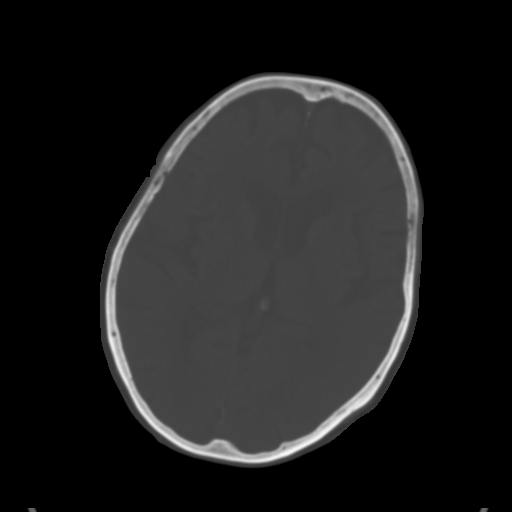
[im 16/29  brain]
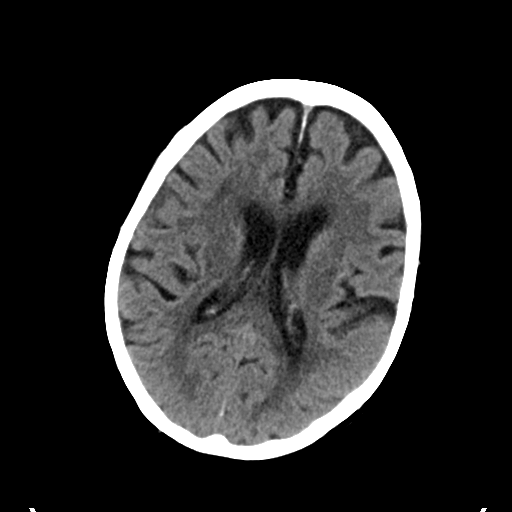
[im 19/29  brain]
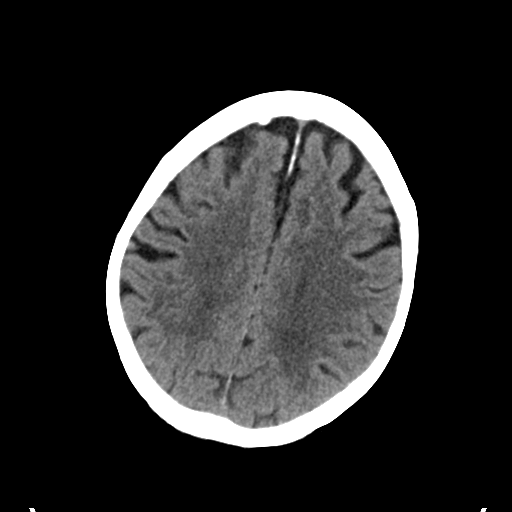
[im 22/29  brain]
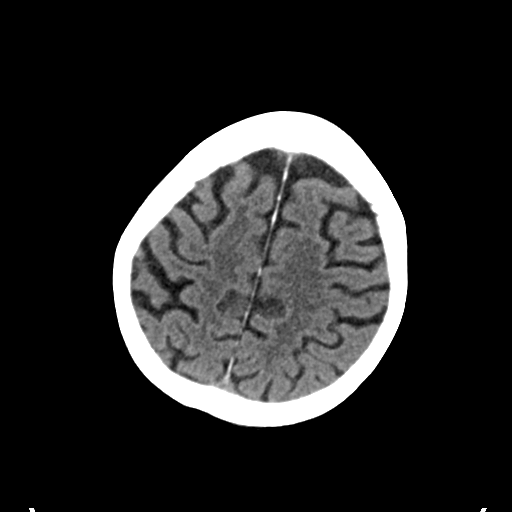
[im 24/29  brain]
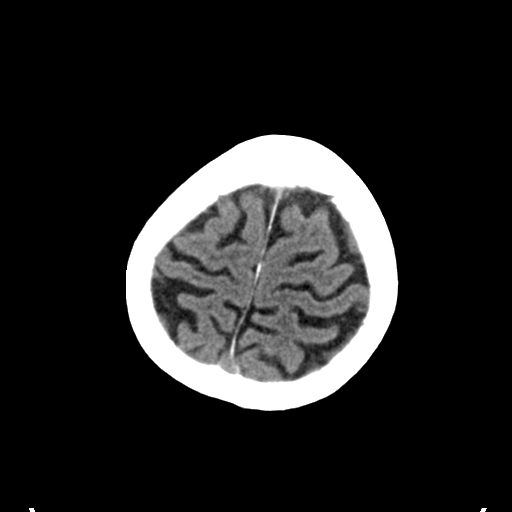
[im 24/29  bone]
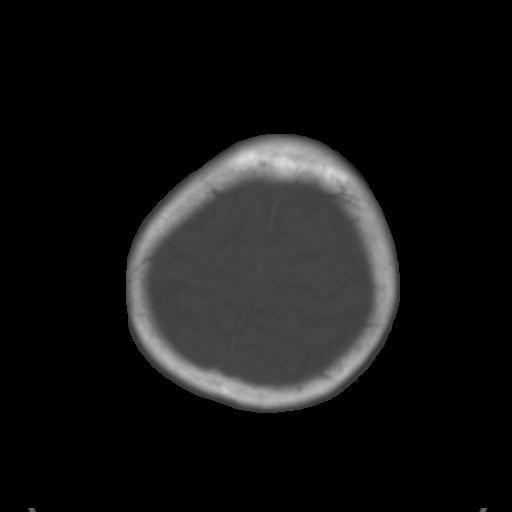
[im 27/29  brain]
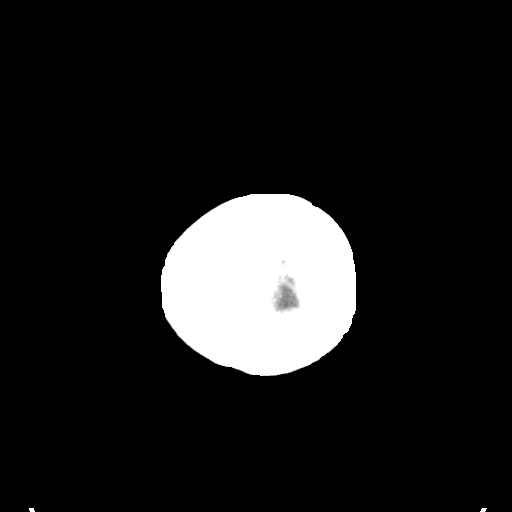

[Series 4: coronal soft tissue · coronal · 0.35mm/px · 3 of 62 slices shown]
[im 21/62  brain]
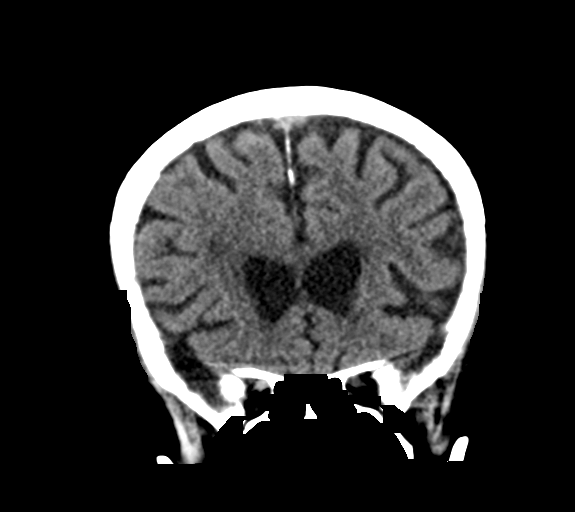
[im 28/62  brain]
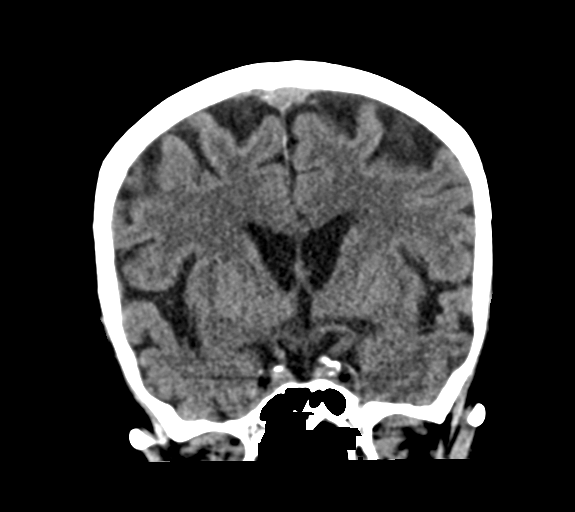
[im 34/62  brain]
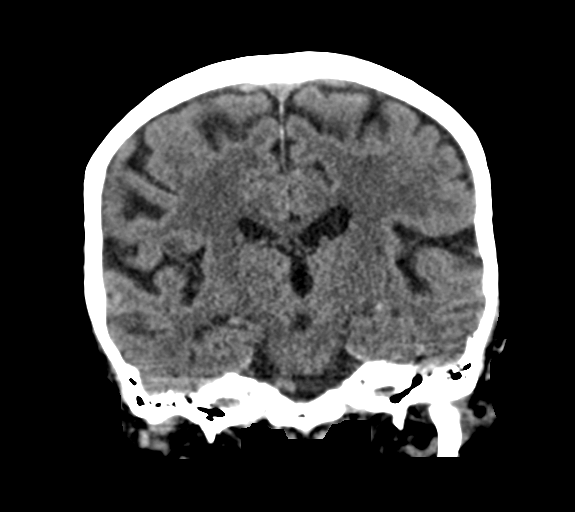

[Series 5: sagittal soft tissue · sagittal · 0.36mm/px · 3 of 51 slices shown]
[im 17/51  brain]
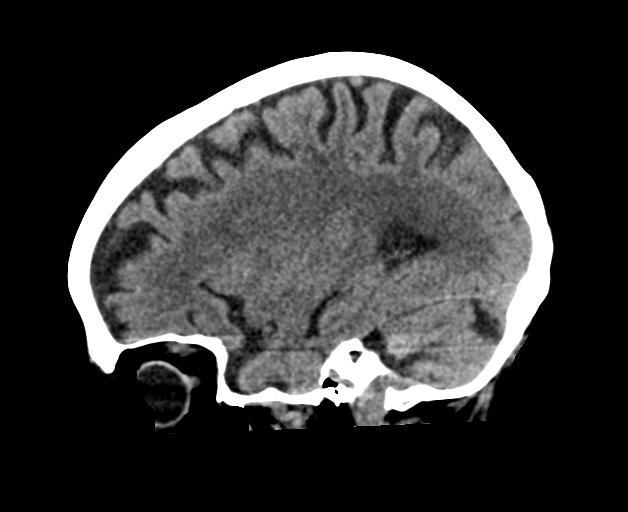
[im 26/51  brain]
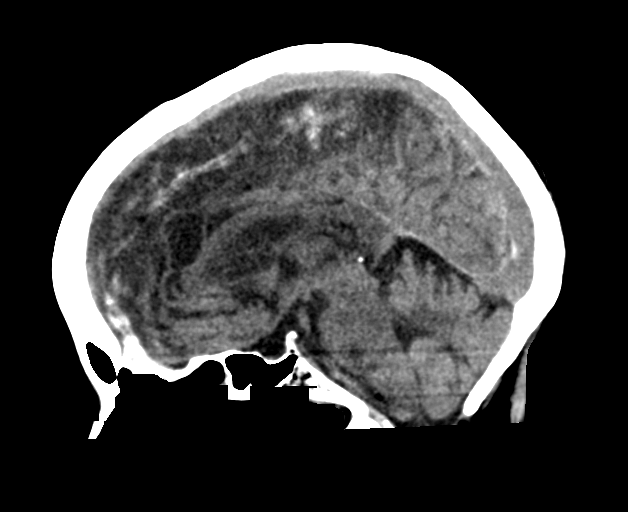
[im 34/51  brain]
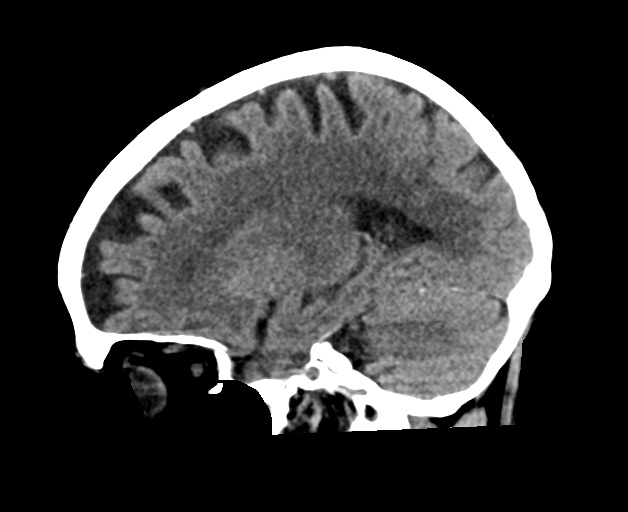

[16 of 46 positions shown; findings below may reference images not displayed]

FINDINGS: Brain: Generalized atrophy. Chronic microvascular ischemic changes
in the white matter bilaterally. Chronic infarct right cerebellum
unchanged. Negative for acute infarct. Negative for hemorrhage or
mass. No midline shift.

Vascular: Negative for hyperdense vessel

Skull: Negative

Sinuses/Orbits: Negative

Other: None
IMPRESSION: No acute abnormality. Atrophy and chronic ischemic changes as above.

## 2020-03-27 IMAGING — MR MRA HEAD WITHOUT CONTRAST
1 series · 20 of 48 positions shown · non-contrast
Comparison: MRI head 07/27/2019

CLINICAL DATA: Stroke

EXAM:
MRA HEAD WITHOUT CONTRAST
TECHNIQUE: Angiographic images of the Circle of Willis were obtained using MRA
technique without intravenous contrast.

[Series 4: ax (id) · axial · 1.0mm · 0.43mm/px · z∈[-38,+49]mm · 20 of 184 slices shown]
[im 1/184]
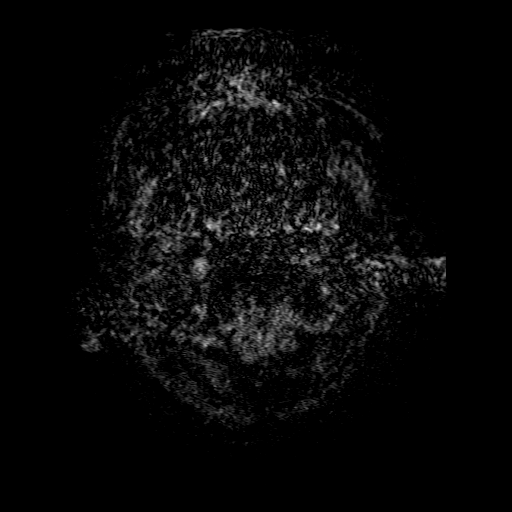
[im 4/184]
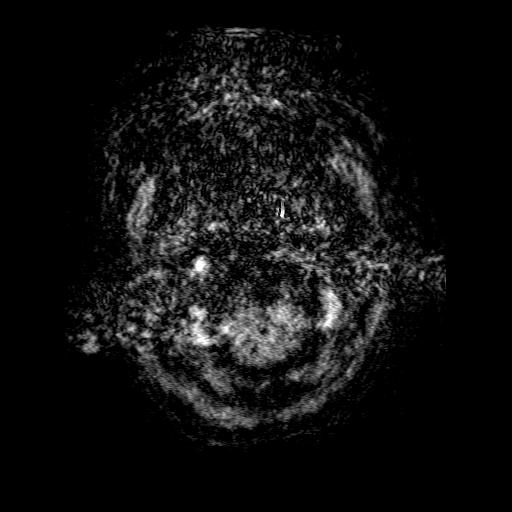
[im 8/184]
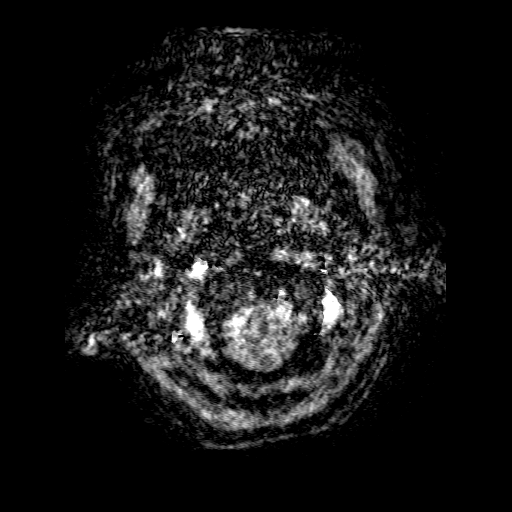
[im 12/184]
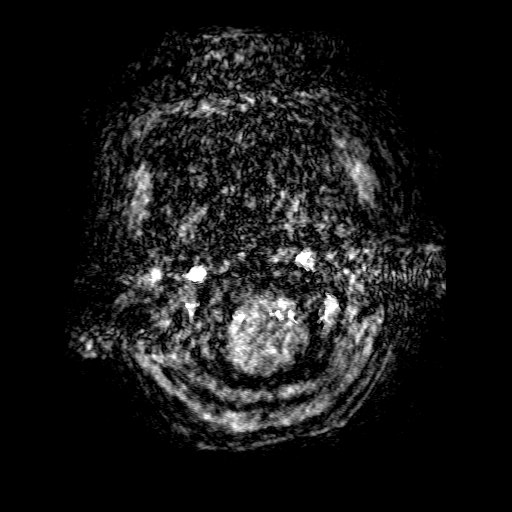
[im 16/184]
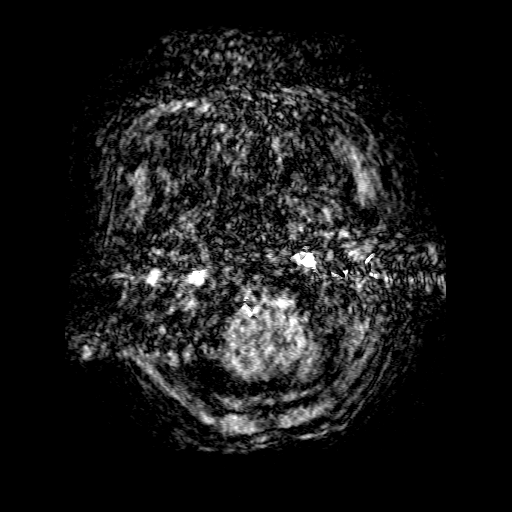
[im 20/184]
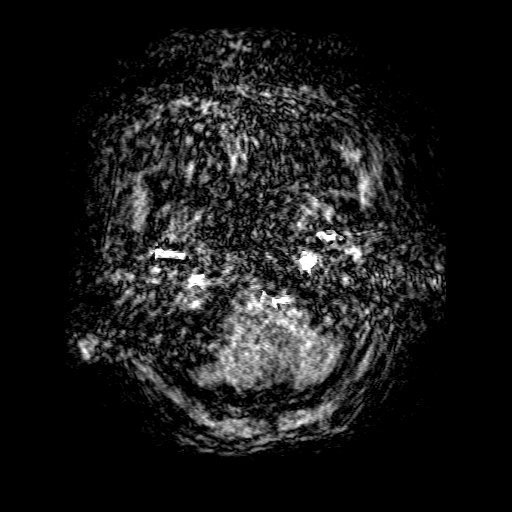
[im 24/184]
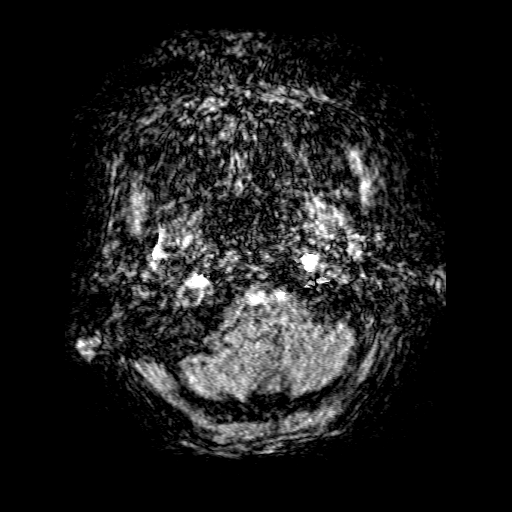
[im 28/184]
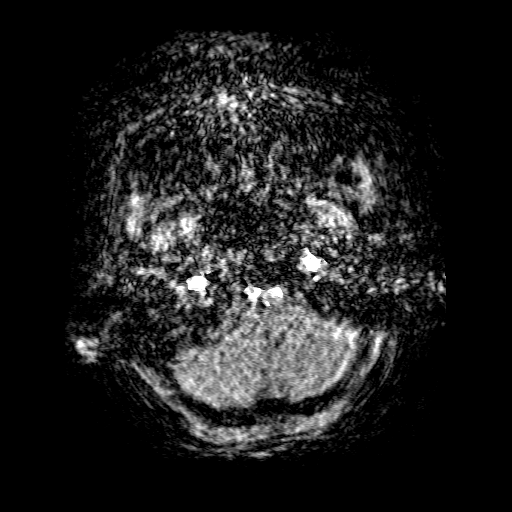
[im 32/184]
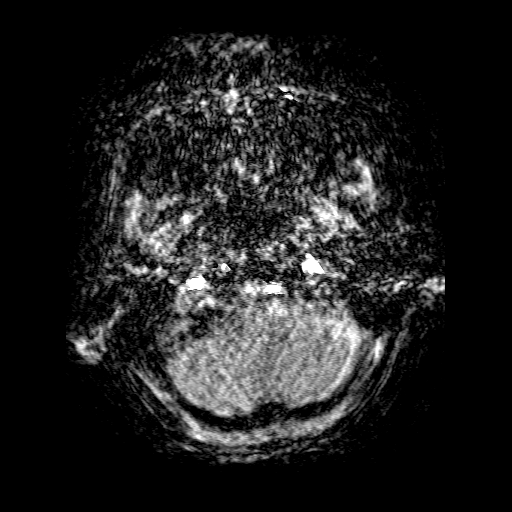
[im 36/184]
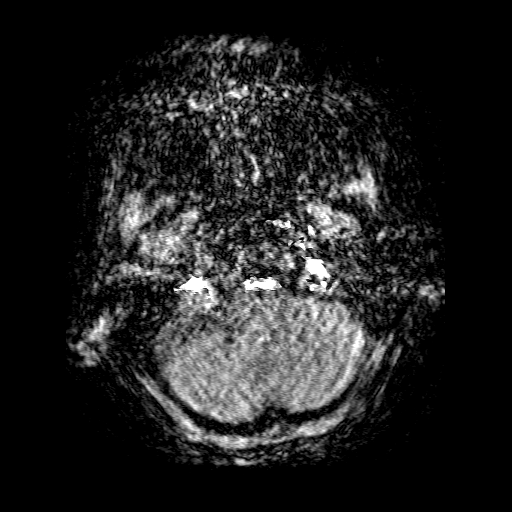
[im 39/184]
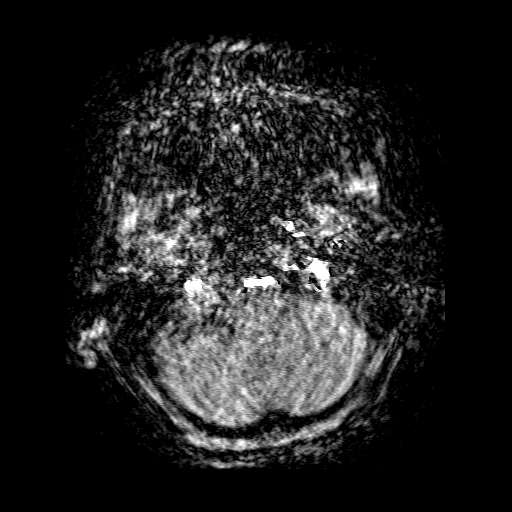
[im 43/184]
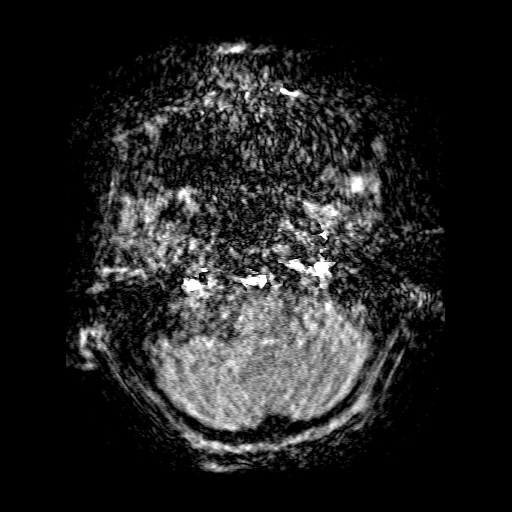
[im 59/184]
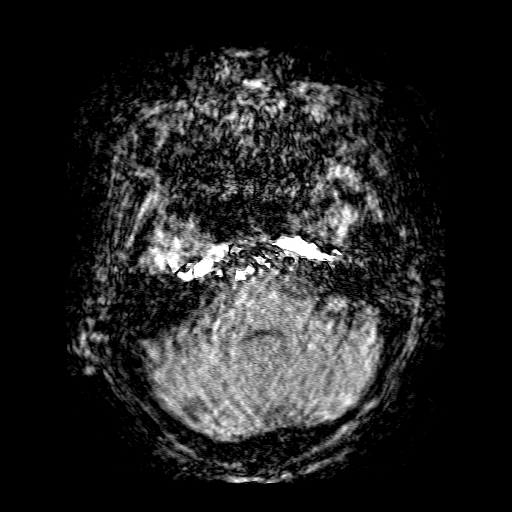
[im 82/184]
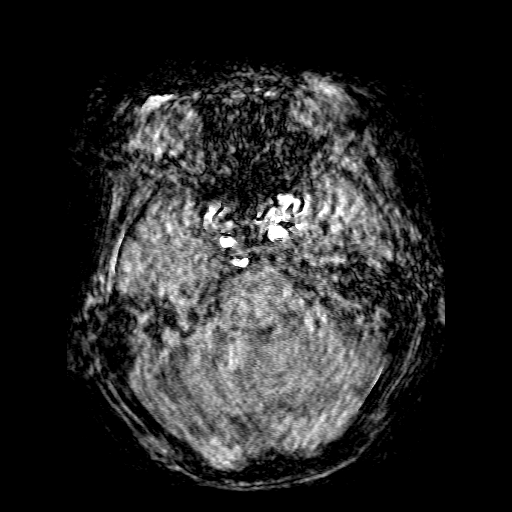
[im 94/184]
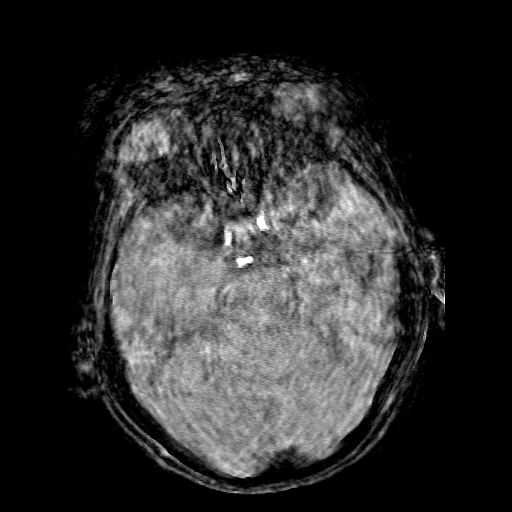
[im 106/184]
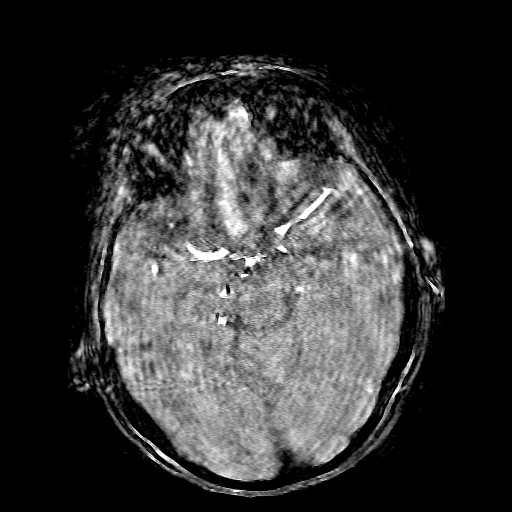
[im 129/184]
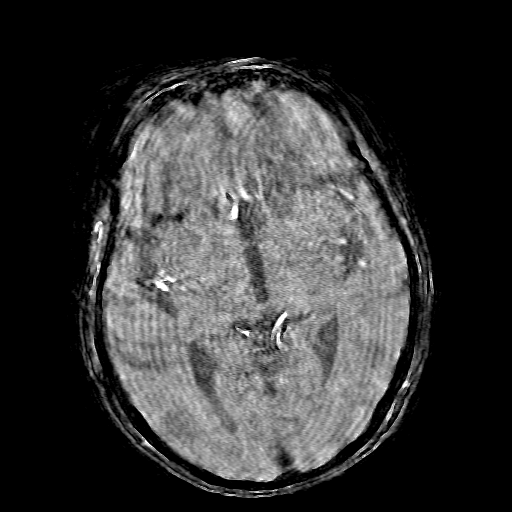
[im 152/184]
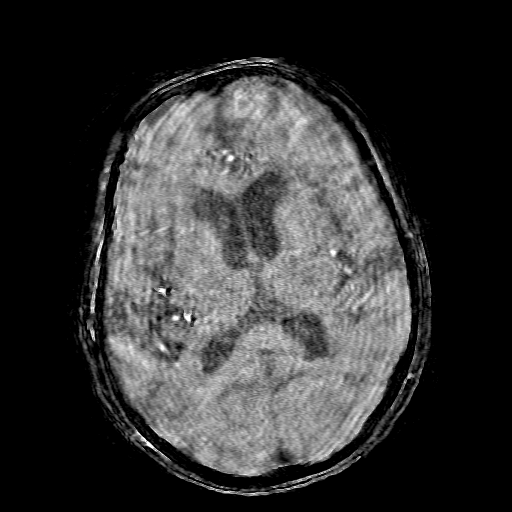
[im 156/184]
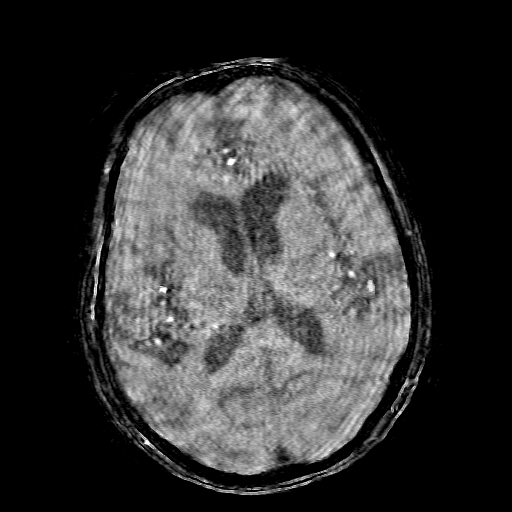
[im 176/184]
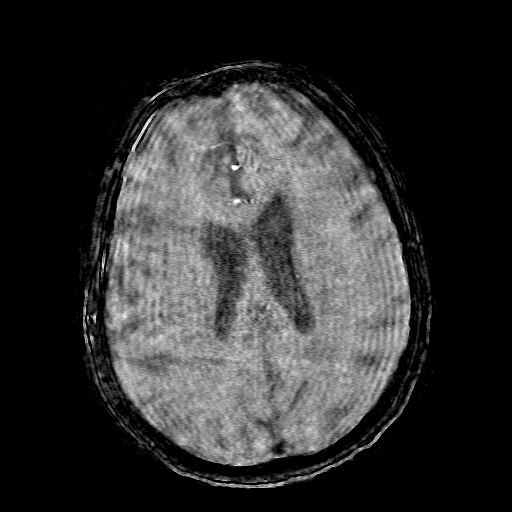

[20 of 48 positions shown; findings below may reference images not displayed]

FINDINGS: Image quality degraded by extensive motion. This examination is
significantly limited in quality by motion.

Both vertebral arteries are patent. The basilar is patent. Posterior
cerebral arteries patent bilaterally.

Internal carotid artery patent bilaterally. Signal loss in the
cavernous carotid bilaterally is likely artifactual. Anterior middle
cerebral arteries appear patent bilaterally. Both anterior cerebral
arteries supplied from the left.
IMPRESSION: Negative for large vessel occlusion. Image quality is significantly
degraded by extensive motion.

## 2020-03-28 IMAGING — DX LEFT WRIST - 2 VIEW
1 series · 2 of 2 positions shown · non-contrast
Comparison: No recent prior.

CLINICAL DATA: Acute left wrist pain.

EXAM:
LEFT WRIST - 2 VIEW

[Series 1: wrist · 0.14mm/px · 2 of 2 slices shown]
[im 1/2]
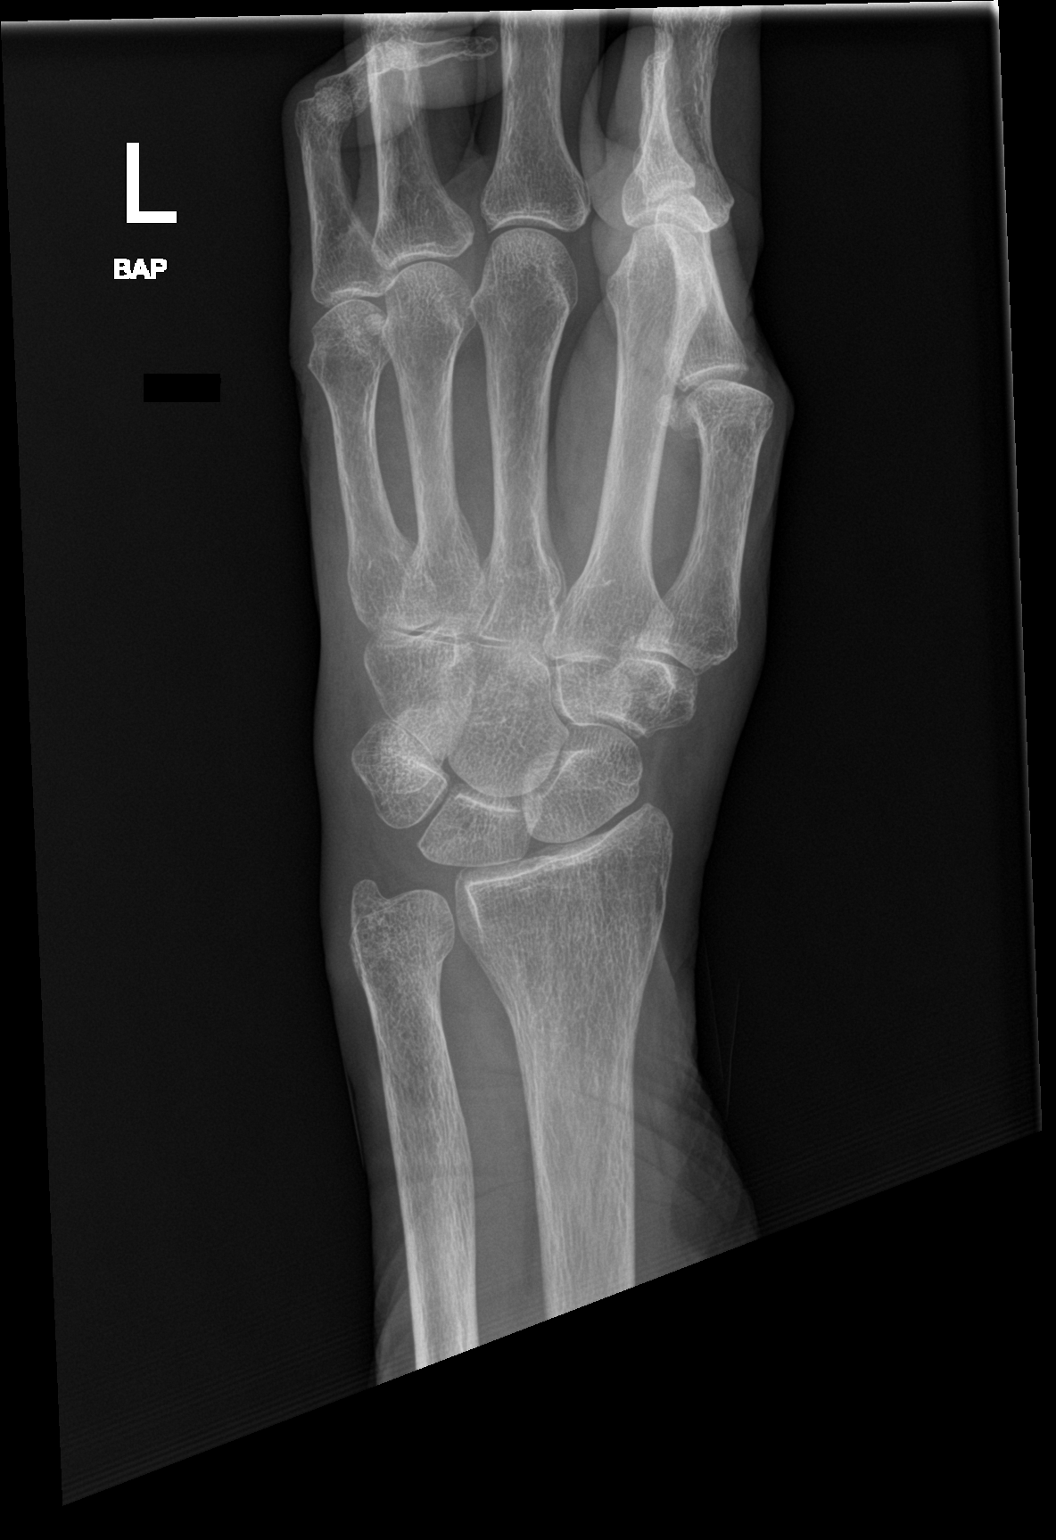
[im 2/2]
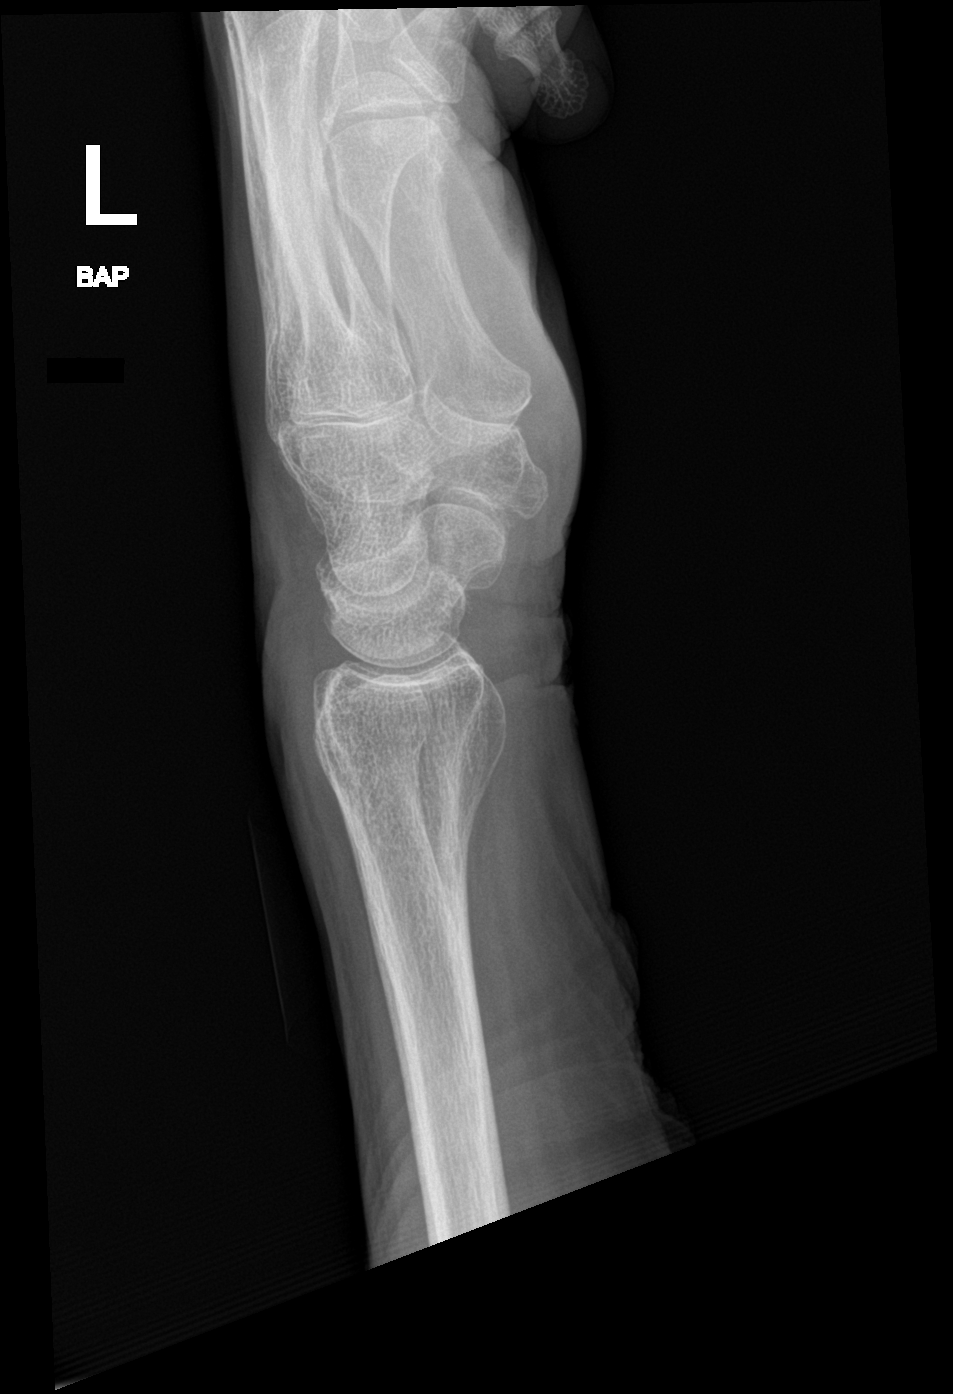

[2 of 2 positions shown; findings below may reference images not displayed]

FINDINGS: Soft tissue swelling. No radiopaque foreign body. No acute bony
abnormality. No evidence of fracture or dislocation. Osteopenia.
Mild degenerative changes radiocarpal joint and first
carpometacarpal joint. No evidence of inflammatory arthropathy.
IMPRESSION: Soft tissue swelling. No radiopaque foreign body. Osteopenia mild
degenerative changes. No acute abnormality. No evidence of
inflammatory arthropathy.

## 2020-03-30 ENCOUNTER — Emergency Department (HOSPITAL_COMMUNITY): Payer: Medicare Other

## 2020-03-30 ENCOUNTER — Other Ambulatory Visit: Payer: Self-pay

## 2020-03-30 ENCOUNTER — Encounter (HOSPITAL_COMMUNITY): Payer: Self-pay | Admitting: *Deleted

## 2020-03-30 ENCOUNTER — Emergency Department (HOSPITAL_COMMUNITY)
Admission: EM | Admit: 2020-03-30 | Discharge: 2020-03-30 | Disposition: A | Discharge status: Home / Self Care | Payer: Medicare Other | Attending: Emergency Medicine | Admitting: Emergency Medicine

## 2020-03-30 DIAGNOSIS — Z87891 Personal history of nicotine dependence: Secondary | ICD-10-CM | POA: Insufficient documentation

## 2020-03-30 DIAGNOSIS — Z7982 Long term (current) use of aspirin: Secondary | ICD-10-CM | POA: Insufficient documentation

## 2020-03-30 DIAGNOSIS — I10 Essential (primary) hypertension: Secondary | ICD-10-CM | POA: Insufficient documentation

## 2020-03-30 DIAGNOSIS — R197 Diarrhea, unspecified: Secondary | ICD-10-CM | POA: Insufficient documentation

## 2020-03-30 DIAGNOSIS — Z79899 Other long term (current) drug therapy: Secondary | ICD-10-CM | POA: Diagnosis not present

## 2020-03-30 LAB — COMPREHENSIVE METABOLIC PANEL
ALT: 12 U/L (ref 0–44)
AST: 20 U/L (ref 15–41)
Albumin: 3.7 g/dL (ref 3.5–5.0)
Alkaline Phosphatase: 95 U/L (ref 38–126)
Anion gap: 11 (ref 5–15)
BUN: 21 mg/dL (ref 8–23)
CO2: 23 mmol/L (ref 22–32)
Calcium: 10.1 mg/dL (ref 8.9–10.3)
Chloride: 106 mmol/L (ref 98–111)
Creatinine, Ser: 0.67 mg/dL (ref 0.44–1.00)
GFR calc Af Amer: >60 mL/min (ref >60)
GFR calc non Af Amer: >60 mL/min (ref >60)
Glucose, Bld: 114 mg/dL — ABNORMAL HIGH (ref 70–99)
Potassium: 3.9 mmol/L (ref 3.5–5.1)
Sodium: 140 mmol/L (ref 135–145)
Total Bilirubin: 0.7 mg/dL (ref 0.3–1.2)
Total Protein: 6.4 g/dL — ABNORMAL LOW (ref 6.5–8.1)

## 2020-03-30 LAB — CBC WITH DIFFERENTIAL/PLATELET
Abs Immature Granulocytes: 0.04 10*3/uL (ref 0.00–0.07)
Basophils Absolute: 0.1 10*3/uL (ref 0.0–0.1)
Basophils Relative: 1 %
Eosinophils Absolute: 0.3 10*3/uL (ref 0.0–0.5)
Eosinophils Relative: 3 %
HCT: 41.6 % (ref 36.0–46.0)
Hemoglobin: 13.1 g/dL (ref 12.0–15.0)
Immature Granulocytes: 0 %
Lymphocytes Relative: 30 %
Lymphs Abs: 3.2 10*3/uL (ref 0.7–4.0)
MCH: 27.6 pg (ref 26.0–34.0)
MCHC: 31.5 g/dL (ref 30.0–36.0)
MCV: 87.6 fL (ref 80.0–100.0)
Monocytes Absolute: 0.7 10*3/uL (ref 0.1–1.0)
Monocytes Relative: 7 %
Neutro Abs: 6.1 10*3/uL (ref 1.7–7.7)
Neutrophils Relative %: 59 %
Platelets: 393 10*3/uL (ref 150–400)
RBC: 4.75 MIL/uL (ref 3.87–5.11)
RDW: 14.1 % (ref 11.5–15.5)
WBC: 10.4 10*3/uL (ref 4.0–10.5)
nRBC: 0 % (ref 0.0–0.2)

## 2020-03-30 MED ORDER — OXYCODONE-ACETAMINOPHEN 5-325 MG PO TABS
1.0000 | ORAL_TABLET | Freq: Once | ORAL | Status: AC
  Administered 2020-03-30: 14:00:00 1 via ORAL
  Filled 2020-03-30: qty 1

## 2020-03-30 NOTE — Discharge Instructions (Addendum)
Your laboratory results were within normal limits.  You were offered placement to Skilled nursing facility, but continue to refuse.  Please take the medication that you were prescribed for pain. Follow up with your primary care physician as needed.

## 2020-03-30 NOTE — ED Notes (Signed)
Call grandson Ben Case at [phone]

## 2020-03-30 NOTE — ED Triage Notes (Signed)
Patient presents to ed via [facility] from home states he fell last thurs c/o pain in back and right arm. States this am she felt very weak and had 4 episodes of diarrhea. Patient is alert and oriented.

## 2020-03-30 NOTE — ED Notes (Signed)
Pt called out and informed this RN that "I got someone coming after me" and explained to this RN that "They told me they can't keep me overnight and that I'm going home so I got someone coming after me." This RN confirmed with her primary RN that she is NOT up for discharge at this time. When asked who she called, pt responded "I'm not going to tell you that. Cause then they are going to intercept me, and I'm not going to rehab." She then requested that this RN put her bed rail down so that she could get dressed, and when this RN attempted to dissuade her she said "Just put it down anyway." This RN declined due to fall risk  and left room door open for better visibility of patient.

## 2020-03-30 NOTE — ED Provider Notes (Signed)
[facility] EMERGENCY DEPARTMENT Provider Note   CSN: [id number] Arrival date & time: 03/30/20  1148     History Chief Complaint  Patient presents with  . Diarrhea  . Back Pain    Angelica Bell is a 86 y.o. female.  86 y.o female with a PMH of frequent falls and pneumonia presents to the ED with a chief complaint of back pain along with diarrhea.  Patient was evaluated in the ED earlier last month, after a fall.  She had imaging done which showed a fourth, fifth, sixth rib fracture.  Rest of her imaging was within normal limits.  She reports on today's visit pain is getting worse, she is having difficulty ambulating along with getting around the house.  She also states 4 episodes of diarrhea this morning, no blood in her stool.  She says she feels overall weak.  Has not been running any fevers, according to her chart which I extensively reviewed and obtain information patient currently has her grandson living with her.  No fever, chest pain, shortness of breath, other complaints.  The history is provided by the patient.       Past Medical History:  Diagnosis Date  . Chronic back pain    with degenerative joint disease  . Chronic pain disorder    with narcotic management  . Dry eyes   . Frequent falls   . HOH (hard of hearing)   . Hypertension   . Multiple rib fractures 02/2020  . Osteoporosis   . PE (pulmonary embolism)    After hysterectomy in 1967  . Pneumonia   . Rhabdomyolysis     Patient Active Problem List   Diagnosis Date Noted  . DNR (do not resuscitate) 03/09/2020  . Frequent falls 03/09/2020  . Multiple rib fractures 03/08/2020  . Common bile duct dilatation 03/08/2020  . Leukocytosis 03/08/2020  . Normocytic anemia 03/08/2020  . Hypomagnesemia 02/08/2020  . Fall 02/06/2020  . AKI (acute kidney injury) ([place]) 02/05/2020  . UTI (urinary tract infection) 09/01/2019  . Rhabdomyolysis 09/01/2019  . Shock ([place]) 08/31/2019  . Acute  cystitis 08/03/2019  . Confusion and disorientation 08/03/2019  . Cerebrovascular accident (CVA) ([place])   . ARF (acute renal failure) ([place]) 07/26/2019  . Hyponatremia 07/26/2019  . Abnormal liver function 07/26/2019  . Peripheral neuropathy   . Drug overdose   . Polypharmacy   . Altered mental status 10/25/2017  . Closed displaced intertrochanteric fracture of left femur ([place]) 02/16/2017  . Hip fracture, right ([place]) 12/25/2015  . Chronic pain 12/24/2015  . Fracture of femoral neck, right, closed ([place]) 12/24/2015  . Essential hypertension 12/24/2015  . Community acquired pneumonia 04/15/2013  . Hypokalemia 04/15/2013  . Labial cyst 02/08/2012  . POSTMENOPAUSAL SYNDROME 11/28/2009  . PERIPHERAL NEUROPATHY, LOWER EXTREMITY, RIGHT 02/22/2009  . INSOMNIA-SLEEP DISORDER-UNSPEC 10/12/2008  . Lumbago 08/02/2008  . 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 ANXIETY DEPRESSION 12/16/2007  . Hyperlipidemia 07/08/2007  . Osteoarthritis 07/08/2007  . Osteoporosis 07/08/2007  . SCOLIOSIS NEC 07/08/2007    Past Surgical History:  Procedure Laterality Date  . ABDOMINAL HYSTERECTOMY    . APPENDECTOMY    . BREAST ENHANCEMENT SURGERY    . CATARACT EXTRACTION    . FEMUR IM NAIL Left 02/17/2017   Procedure: INTRAMEDULLARY (IM) NAIL FEMORAL;  Surgeon: Nicholes Stairs, MD;  Location: [facility];  Service: Orthopedics;  Laterality: Left;  . HIP ARTHROPLASTY Right 12/24/2015   Procedure: ARTHROPLASTY BIPOLAR HIP (HEMIARTHROPLASTY);  Surgeon: Richardson Landry  Veverly Fells, MD;  Location: [facility];  Service: Orthopedics;  Laterality: Right;  . TONSILLECTOMY       OB History   No obstetric history on file.     Family History  Problem Relation Age of Onset  . Heart Problems Mother     Social History   Tobacco Use  . Smoking status: Former Smoker    Years: 13.00    Quit date: 04/15/1976    Years since quitting: 43.9  . Smokeless tobacco: Never Used  Substance Use Topics  . Alcohol use: No  . Drug  use: No    Home Medications Prior to Admission medications   Medication Sig Start Date End Date Taking? Authorizing Provider  aspirin EC 81 MG EC tablet Take 1 tablet (81 mg total) by mouth daily. 09/15/19  Yes Matcha, Anupama, MD  escitalopram (LEXAPRO) 20 MG tablet Take 20 mg by mouth daily. 02/23/20  Yes [provider]  feeding supplement, ENSURE ENLIVE, (ENSURE ENLIVE) LIQD Take 237 mLs by mouth 2 (two) times daily between meals. 03/10/20  Yes Black, Lezlie Octave, NP  ferrous sulfate 325 (65 FE) MG EC tablet Take 325 mg by mouth daily. 01/12/20  Yes [provider]  furosemide (LASIX) 20 MG tablet Take 20 mg by mouth daily. 01/10/20  Yes [provider]  gabapentin (NEURONTIN) 100 MG capsule Take 1 capsule (100 mg total) by mouth 3 (three) times daily. Patient taking differently: Take 100 mg by mouth as needed (pain).  02/08/20  Yes Harold Hedge, MD  methocarbamol (ROBAXIN) 500 MG tablet Take 1 tablet (500 mg total) by mouth every 8 (eight) hours as needed for muscle spasms. 03/10/20  Yes Black, Lezlie Octave, NP  mirabegron ER (MYRBETRIQ) 25 MG TB24 tablet Take 25 mg by mouth daily.   Yes [provider]  Multiple Vitamin (MULTIVITAMIN WITH MINERALS) TABS tablet Take 1 tablet by mouth daily. 03/11/20  Yes Black, Lezlie Octave, NP  oxyCODONE 10 MG TABS Take 1-1.5 tablets (10-15 mg total) by mouth every 4 (four) hours as needed for moderate pain or severe pain. 03/10/20  Yes Black, Lezlie Octave, NP  amLODipine (NORVASC) 10 MG tablet Take 1 tablet (10 mg total) by mouth daily. 09/08/19 03/08/20  Kayleen Memos, DO  atorvastatin (LIPITOR) 10 MG tablet Take 1 tablet (10 mg total) by mouth daily at 6 PM. Patient not taking: Reported on 03/30/2020 08/07/19   Mariel Aloe, MD  oxycodone (ROXICODONE) 30 MG immediate release tablet Take 30 mg by mouth 3 (three) times daily as needed for pain.  03/27/20   [provider]    Allergies    Hctz [hydrochlorothiazide], Amoxicillin-pot  clavulanate, Lisinopril-hydrochlorothiazide, Sulfa antibiotics, and Sulfonamide derivatives  Review of Systems   Review of Systems  Constitutional: Negative for fever.  HENT: Negative for sore throat.   Respiratory: Negative for shortness of breath.   Cardiovascular: Negative for chest pain.  Gastrointestinal: Positive for diarrhea. Negative for abdominal pain and vomiting.  Genitourinary: Negative for flank pain.  Musculoskeletal: Positive for back pain.  Neurological: Positive for weakness.  All other systems reviewed and are negative.   Physical Exam Updated Vital Signs BP (!) 165/87 (BP Location: Right Arm)   Pulse 85   Temp (!) 97.5 F (36.4 C) (Oral)   Resp 18   Ht 5' (1.524 m)   Wt 44 kg   SpO2 95%   BMI 18.94 kg/m   Physical Exam Vitals and nursing note reviewed.  Constitutional:  Appearance: Normal appearance.  HENT:     Head: Normocephalic and atraumatic.     Nose: Nose normal.     Mouth/Throat:     Mouth: Mucous membranes are dry.  Eyes:     Pupils: Pupils are equal, round, and reactive to light.  Cardiovascular:     Rate and Rhythm: Normal rate.  Pulmonary:     Effort: Pulmonary effort is normal.     Breath sounds: No wheezing.  Chest:     Chest wall: Tenderness present.       Comments: Tenderness with palpation of the right chest. Abdominal:     General: Abdomen is flat.     Tenderness: There is no abdominal tenderness. There is no guarding.  Musculoskeletal:     Cervical back: Normal range of motion and neck supple.  Neurological:     Mental Status: She is alert.     ED Results / Procedures / Treatments   Labs (all labs ordered are listed, but only abnormal results are displayed) Labs Reviewed  COMPREHENSIVE METABOLIC PANEL - Abnormal; Notable for the following components:      Result Value   Glucose, Bld 114 (*)    Total Protein 6.4 (*)    All other components within normal limits  CBC WITH DIFFERENTIAL/PLATELET  URINALYSIS,  ROUTINE W REFLEX MICROSCOPIC    EKG None  Radiology DG Chest 2 View  Result Date: 03/30/2020 CLINICAL DATA:  Rib fractures, fell last Thursday EXAM: CHEST - 2 VIEW COMPARISON:  03/09/2020 FINDINGS: Thoracic deformity secondary to marked biconvex thoracolumbar scoliosis. Normal heart size, mediastinal contours, and pulmonary vascularity. Atherosclerotic calcification aorta. Emphysematous and bronchitic changes consistent with COPD. Streaky atelectasis at lung bases. No acute infiltrate, pleural effusion, or pneumothorax. Bones demineralized with fractures of lateral RIGHT fourth fifth and sixth ribs again identified. IMPRESSION: Multiple lateral RIGHT rib fractures. COPD changes with mild bibasilar atelectasis. Electronically Signed   By: Lavonia Dana M.D.   On: 03/30/2020 13:16    Procedures Procedures (including critical care time)  Medications Ordered in ED Medications  oxyCODONE-acetaminophen (PERCOCET/ROXICET) 5-325 MG per tablet 1 tablet (1 tablet Oral Given 03/30/20 1350)    ED Course  I have reviewed the triage vital signs and the nursing notes.  Pertinent labs & imaging results that were available during my care of the patient were reviewed by me and considered in my medical decision making (see chart for details).    MDM Rules/Calculators/A&P   Patient with a PMH hypertension, chronic pain presents to the ED with complaints of pain along with diarrhea.  Patient had a fall on February 2020, was seen in the ED was found to have 4, 5, 6 rib fractures.She was sent home with a prescription for oxycodone 30 mg a total of 90 tablets were filled on March 02, 2020.  Patient reports she has only been taking Tylenol at home however is out of this prescription.  During last ED course patient was admitted due to fall and multiple rib fractures, she was attempted to be placed an at a skilled nursing facility however patient deferred this.  She was picked up by grandson according to her records  the following day.  Patient returns today as her pain is worsening, reports 4 episodes of diarrhea today along with pain that she is unable to control with Tylenol.  During my evaluation she is stable, vitals are within normal limits, lungs are clear to auscultation, does have palpable tenderness to palpation along her chest in  the location of her rib fractures.  She was given hydrocodone to help with her pain.  Interpretation of her labs without any electrolyte deficit despite 4 episodes of diarrhea.  Creatinine level is within normal limits.  LFTs are unremarkable, she does not have any abdominal pain on today's visit.  CBC without any leukocytosis.  UA is currently pending but she does not have any urinary symptoms.  Xray of the chest:  Multiple lateral RIGHT rib fractures.    COPD changes with mild bibasilar atelectasis.       PDMP reviewed: 03/02/2020  1   03/02/2020  Oxycodone Hcl 30 MG Tablet (90 tablets filled)  Spoke to social work who was attempting to help with placement on SNF, although she had declined this on her last visit. Attempted to have patient placed in a SNF however patient reports that she wants to stay in the hospital for 1 day and be discharged tomorrow, we discussed that this is not appropriate management.  Nursing staff has spoken to the grandson who is agreeable to pick up patient at this time.  She is out of her Percocet prescription however patient is on chronic pain and she received 90 tablets about a month ago, we discussed that I am unable to refill this at this time.  No emergent condition for patient to be brought back into the hospital.  Follow-up with PCP recommended.   Portions of this note were generated with [employer]. Dictation errors may occur despite best attempts at proofreading.  Final Clinical Impression(s) / ED Diagnoses Final diagnoses:  Diarrhea, unspecified type    Rx / DC Orders ED Discharge Orders    None       Janeece Fitting, PA-C 03/30/20 [address] [place], MD 03/31/20 [phone]

## 2020-03-30 NOTE — ED Notes (Signed)
Pt given [place] Sandwich and something to drink

## 2020-03-31 NOTE — Progress Notes (Signed)
03/31/2020 3:09 pm Pt is active with [facility], message sent over to rep, Ronalee Belts for resumption of care. [place], [facility] ED TOC CM [phone]

## 2020-04-29 IMAGING — CT CT CERVICAL SPINE W/O CM
3 of 4 series · 14 of 33 positions shown, 17 images · non-contrast
Comparison: Cervical spine CT 07/29/2017.

CLINICAL DATA: 87-year-old female with history of altered mental
status.

EXAM:
CT CERVICAL SPINE WITHOUT CONTRAST
TECHNIQUE: Multidetector CT imaging of the cervical spine was performed without
intravenous contrast. Multiplanar CT image reconstructions were also
generated.

[Series 6: c_spine 2.0 sag bone · sagittal · 0.40mm/px · 5 of 61 slices shown, 6 images]
[im 21/61  bone]
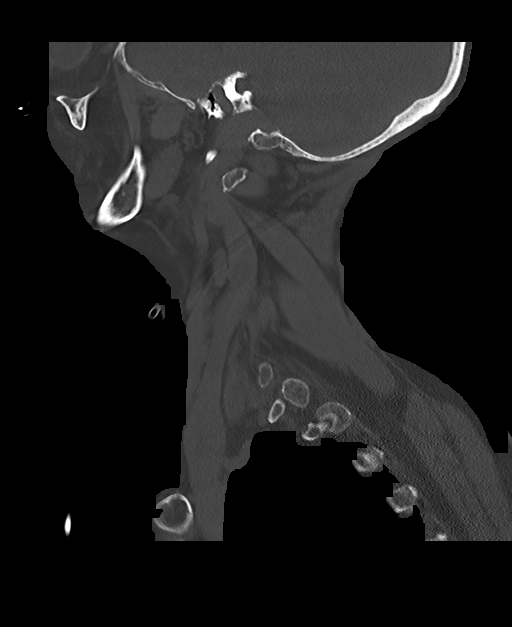
[im 26/61  bone]
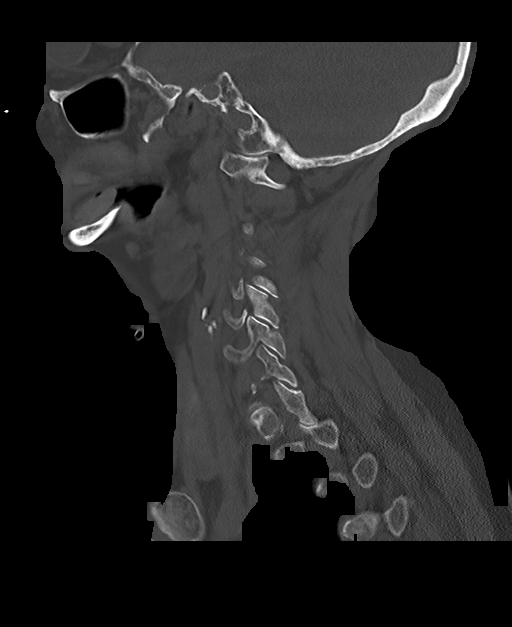
[im 31/61  soft-tissue]
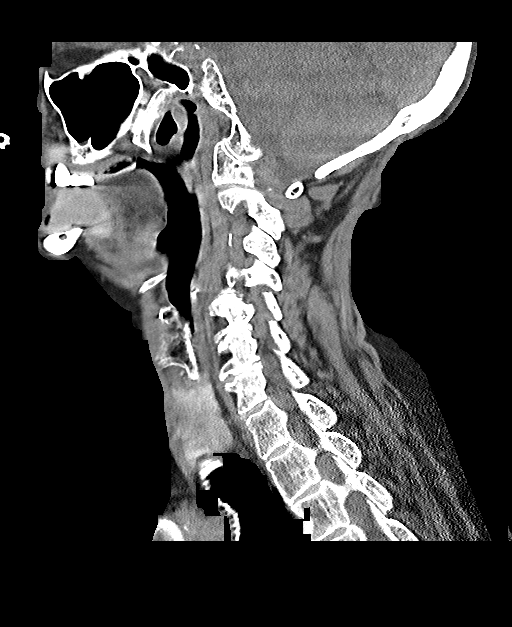
[im 31/61  bone]
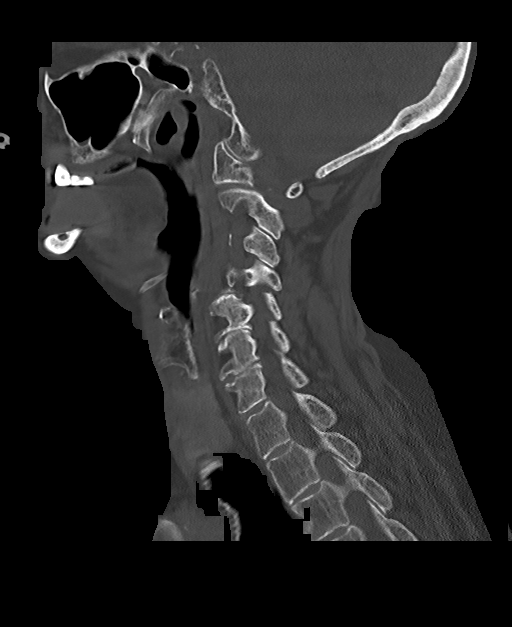
[im 36/61  bone]
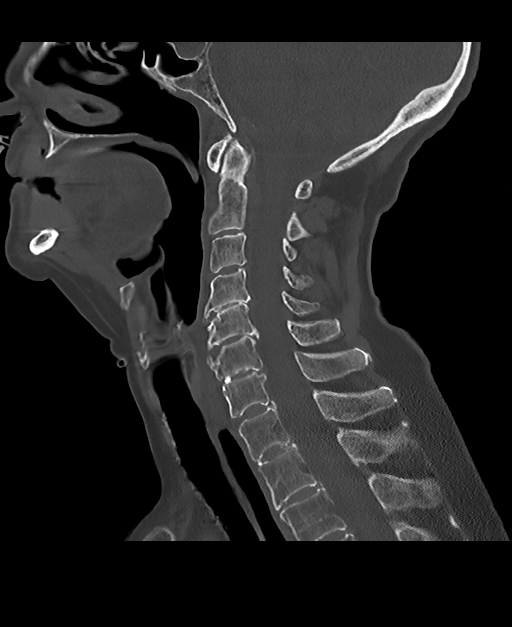
[im 41/61  bone]
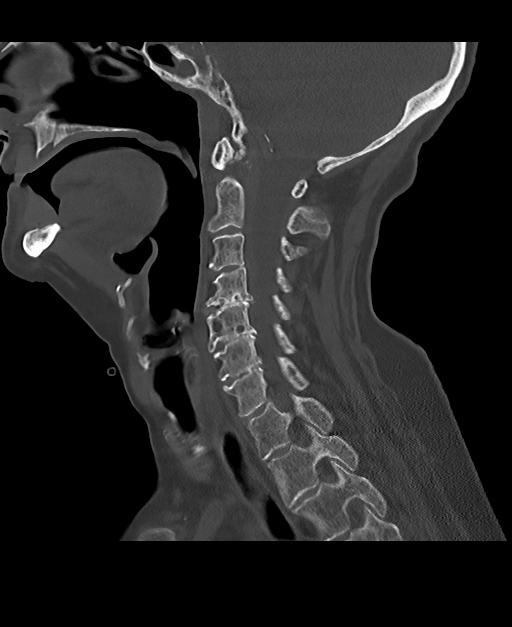

[Series 7: c_spine 2.0 cor bone · coronal · 0.32mm/px · 3 of 61 slices shown]
[im 13/61  bone]
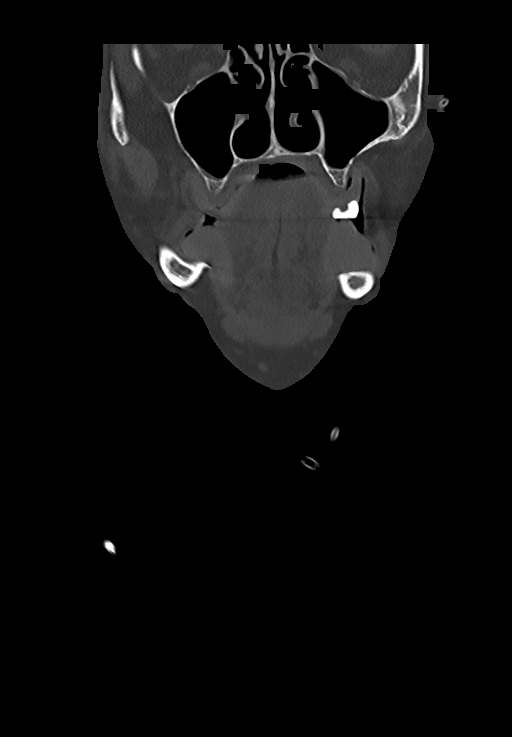
[im 25/61  bone]
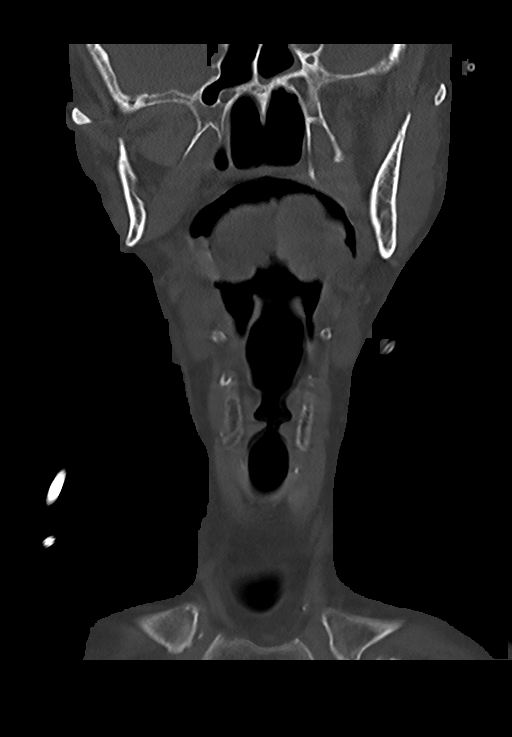
[im 37/61  bone]
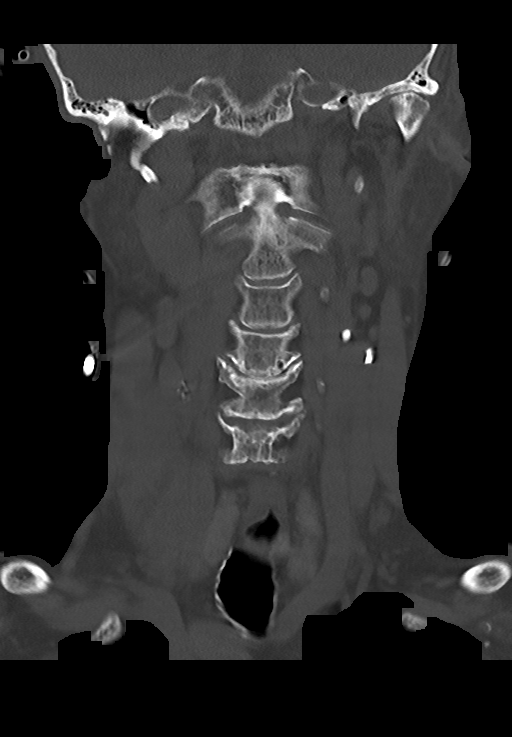

[Series 9: c_spine 1.0 st thins · axial · 0.36mm/px · z∈[-230,-76]mm · 6 of 282 slices shown, 8 images]
[im 32/282  soft-tissue]
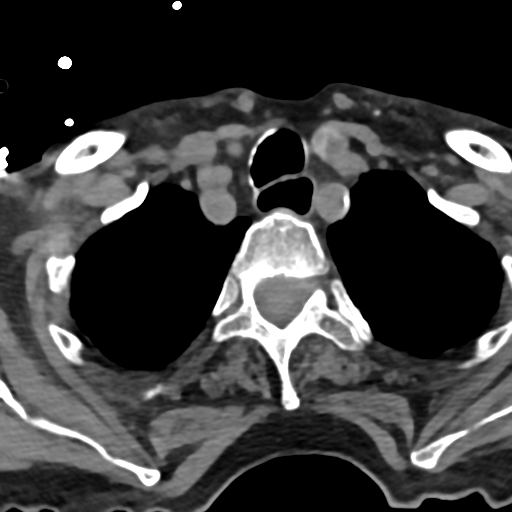
[im 32/282  bone]
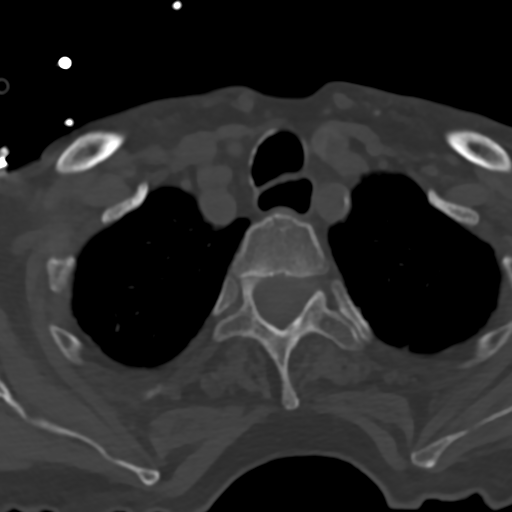
[im 94/282  bone]
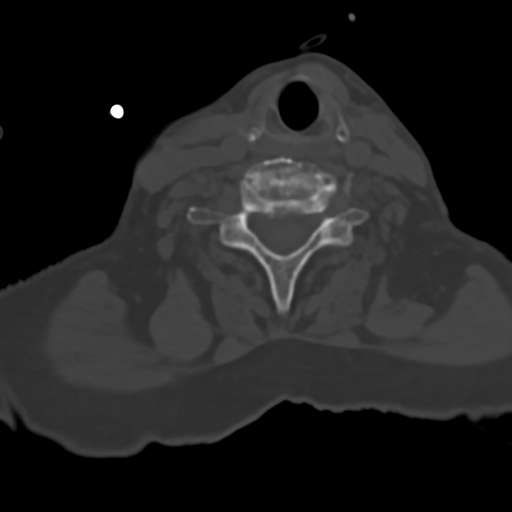
[im 125/282  bone]
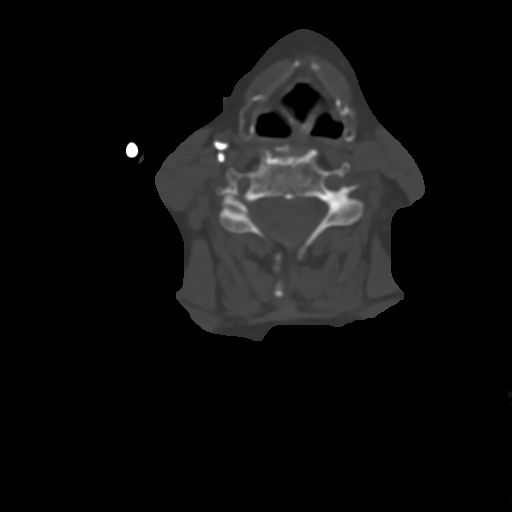
[im 157/282  bone]
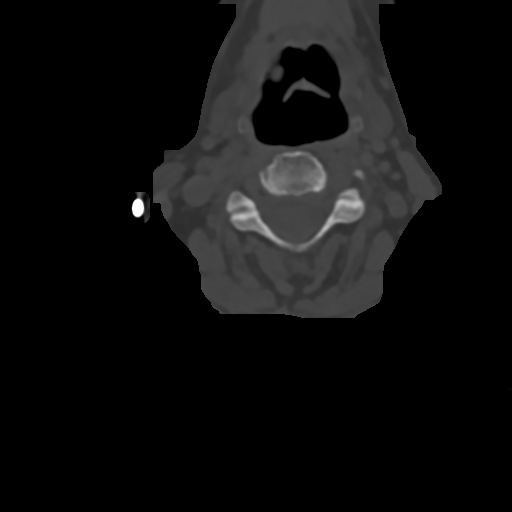
[im 219/282  soft-tissue]
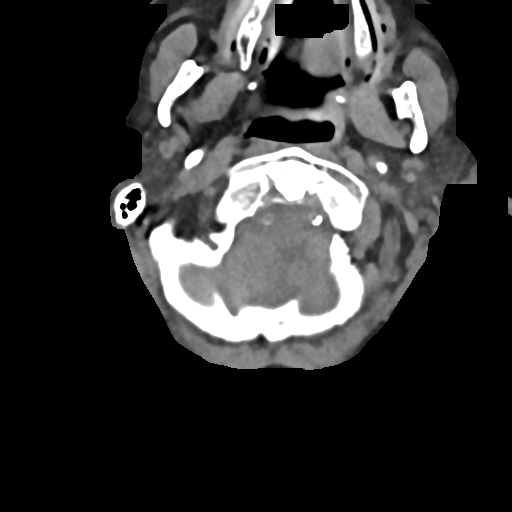
[im 219/282  bone]
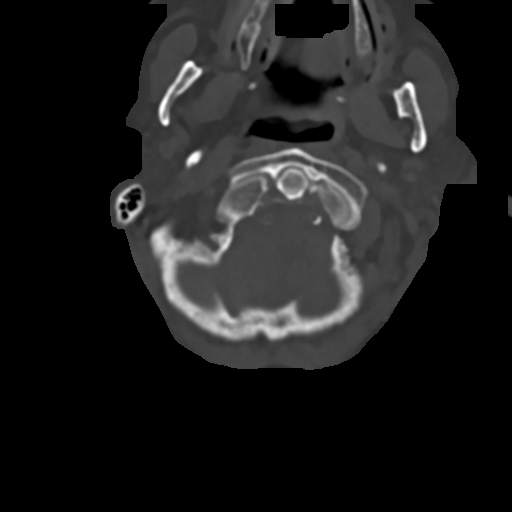
[im 250/282  bone]
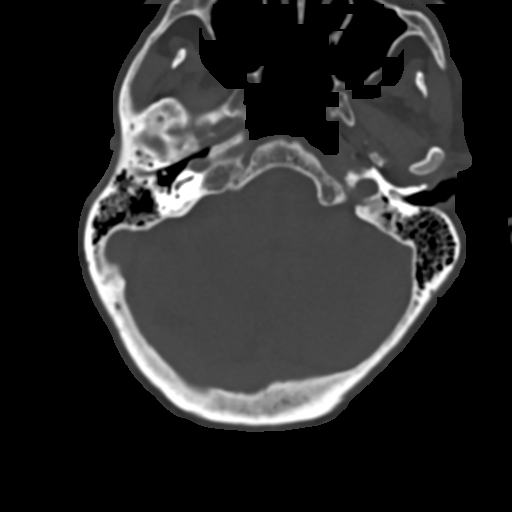

[14 of 33 positions shown; findings below may reference images not displayed]

FINDINGS: Alignment: Normal.

Skull base and vertebrae: No acute fracture. No primary bone lesion
or focal pathologic process.

Soft tissues and spinal canal: No prevertebral fluid or swelling. No
visible canal hematoma.

Disc levels: Multilevel degenerative disc disease, most severe at
C4-C5, C5-C6 and C6-C7. Mild multilevel facet arthropathy.

Upper chest: Unremarkable.

Other: Mild mucosal thickening in the left sphenoid sinus.
IMPRESSION: 1. No acute radiographic abnormality of the cervical spine.
2. Multilevel degenerative disc disease and cervical spondylosis, as
above.

## 2020-04-29 IMAGING — CT CT HEAD W/O CM
3 of 4 series · 12 of 47 positions shown, 14 images · non-contrast
Comparison: Multiple exams, including brain MRI dated 07/27/2019

CLINICAL DATA: Altered level of consciousness

EXAM:
CT HEAD WITHOUT CONTRAST
TECHNIQUE: Contiguous axial images were obtained from the base of the skull
through the vertex without intravenous contrast.

[Series 3: head without · axial · non-contrast · 0.46mm/px · z∈[-96,+20]mm · 6 of 33 slices shown, 8 images]
[im 5/33  brain]
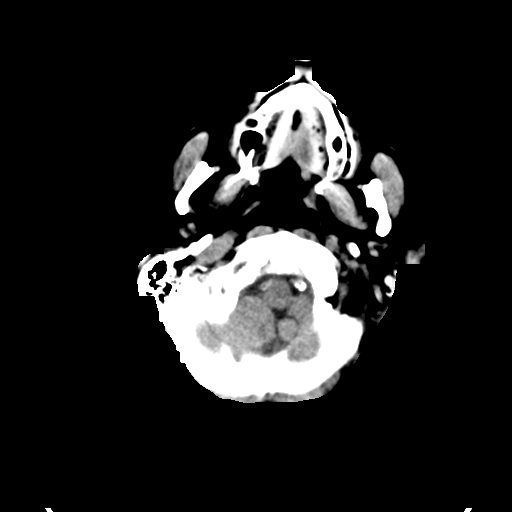
[im 5/33  bone]
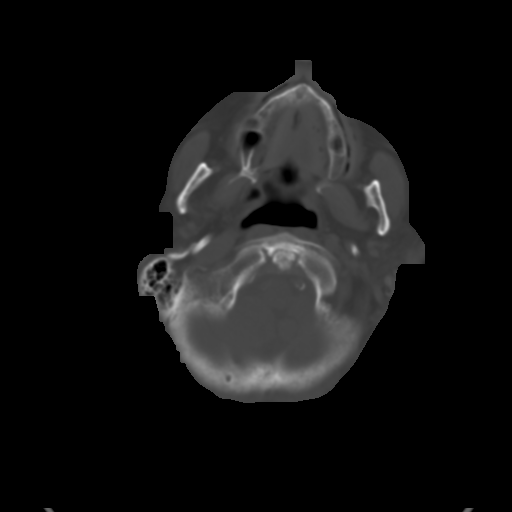
[im 10/33  brain]
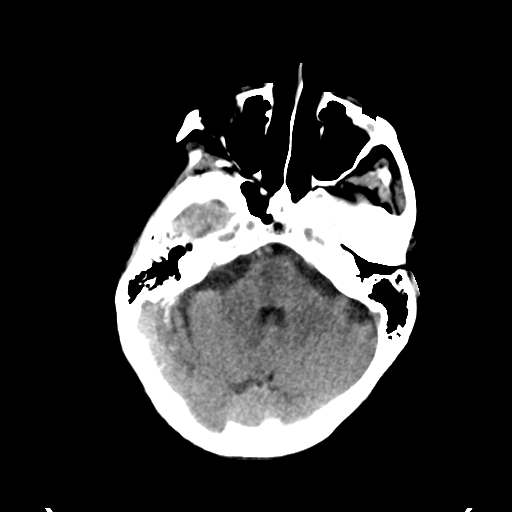
[im 14/33  brain]
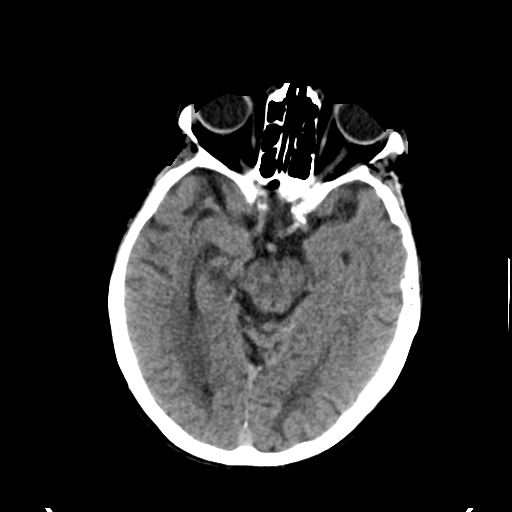
[im 19/33  brain]
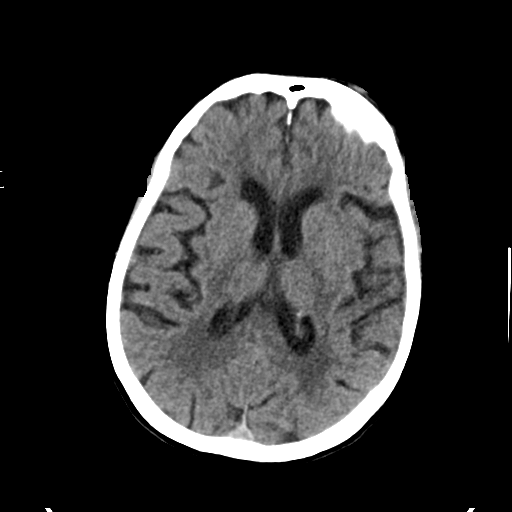
[im 23/33  brain]
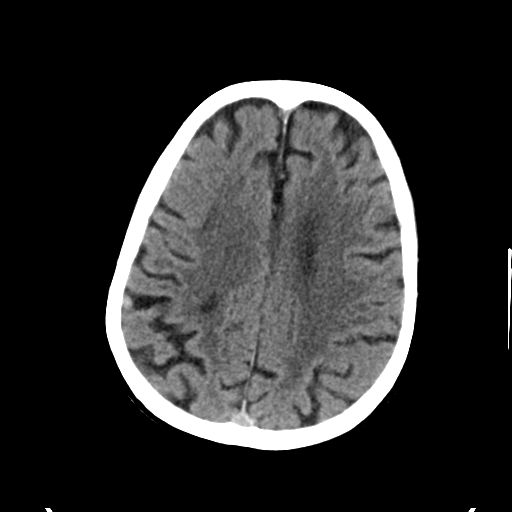
[im 23/33  bone]
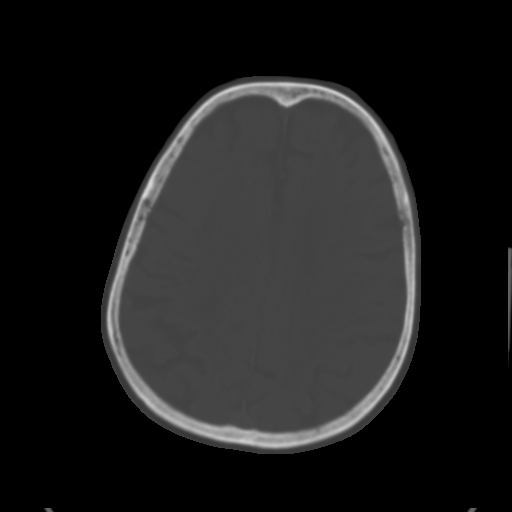
[im 28/33  brain]
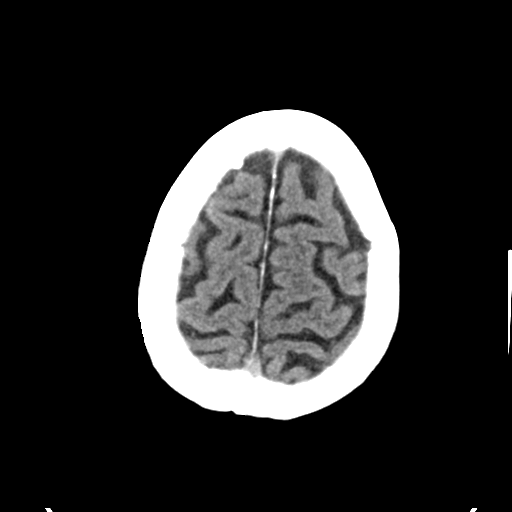

[Series 5: head without cor · coronal · non-contrast · 0.33mm/px · 3 of 84 slices shown]
[im 28/84  brain]
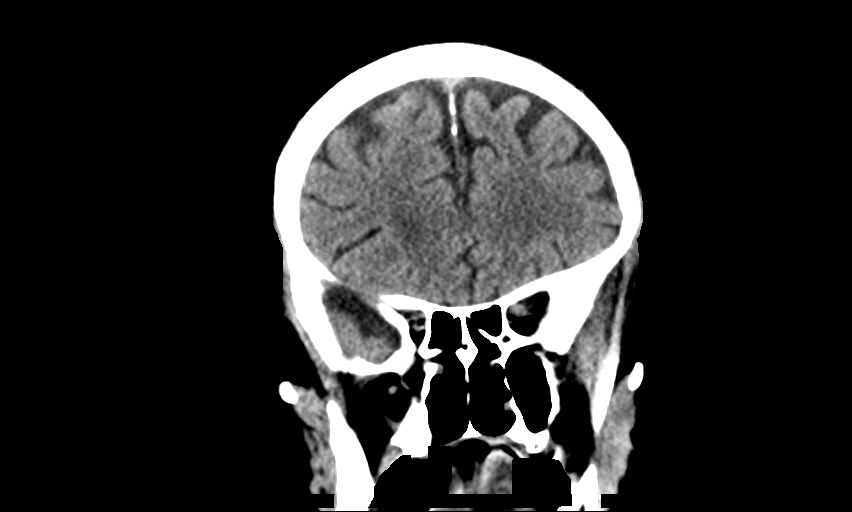
[im 37/84  brain]
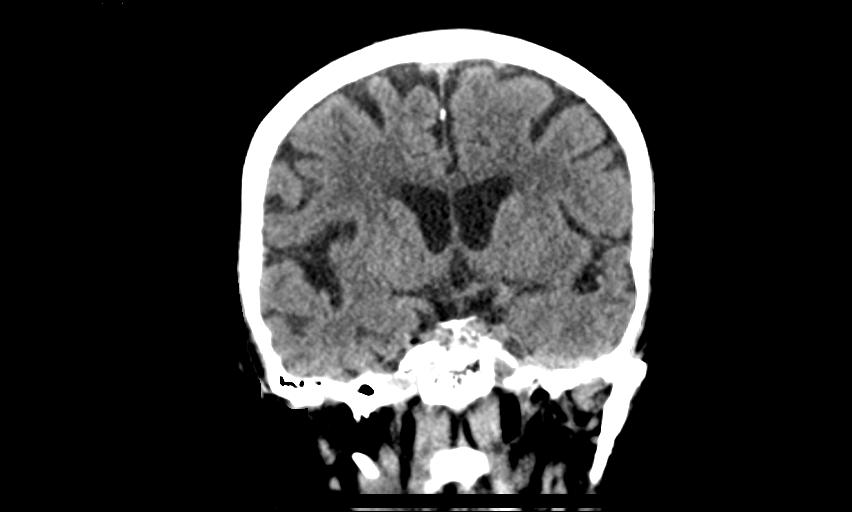
[im 47/84  brain]
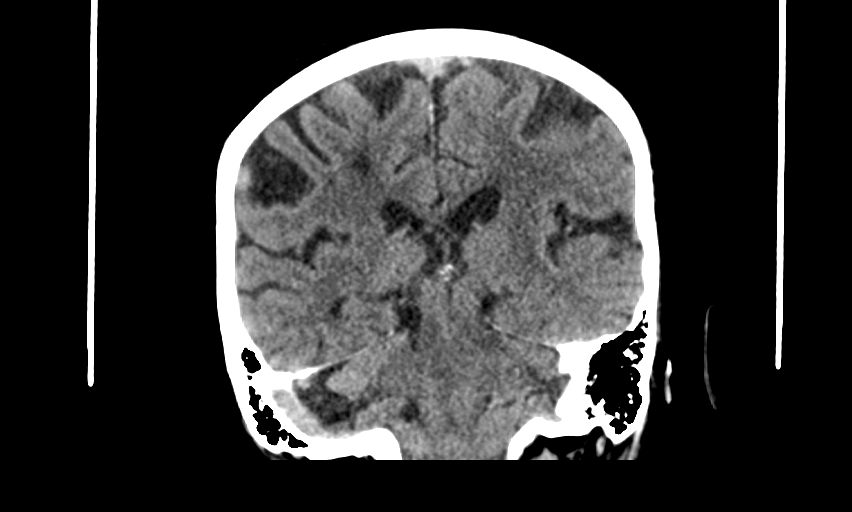

[Series 6: head without sag · sagittal · non-contrast · 0.33mm/px · 3 of 60 slices shown]
[im 20/60  brain]
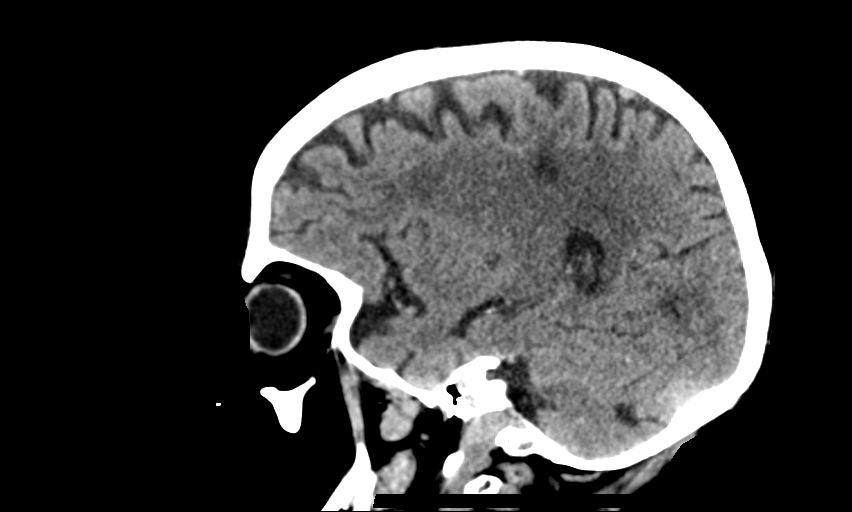
[im 30/60  brain]
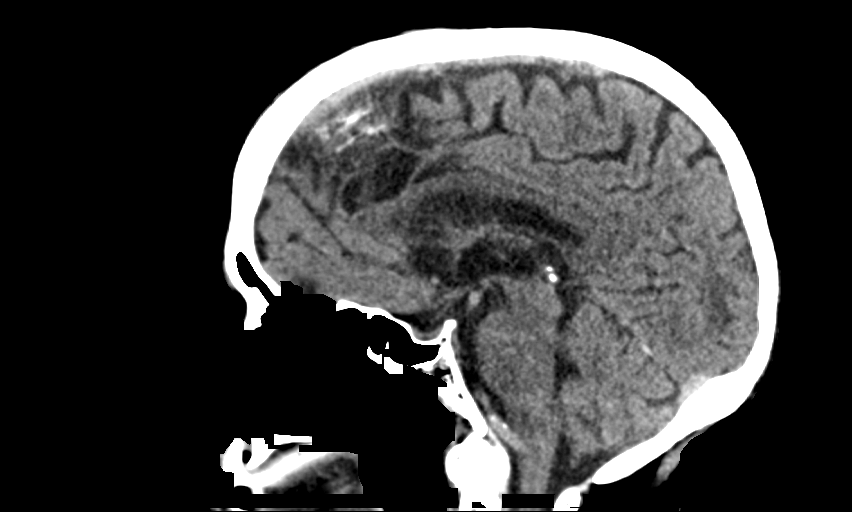
[im 40/60  brain]
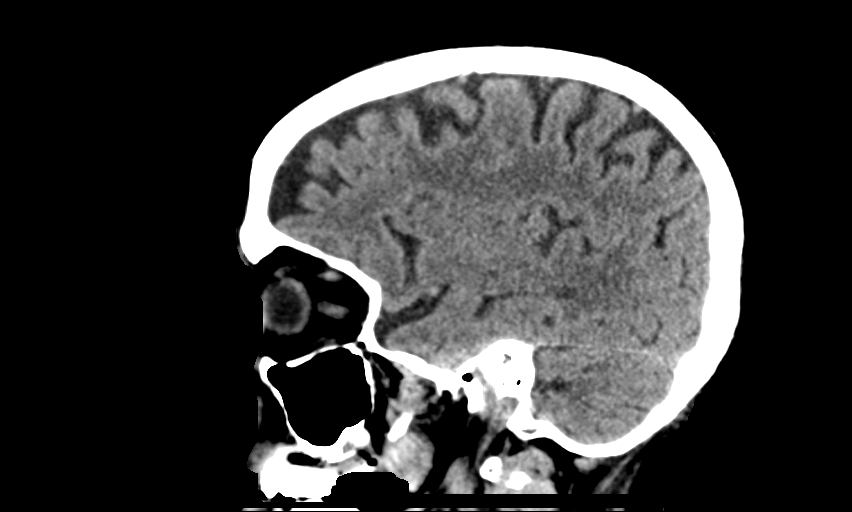

[12 of 47 positions shown; findings below may reference images not displayed]

FINDINGS: Brain: Remote right cerebellar infarct, image [DATE].

Hypodensity in the left occipital white matter on image [DATE]
corresponding to the region of at the time subacute infarct shown on
MRI from 07/27/2019.

[DATE] by 0.6 cm prior small infarct in the right parietal corona
radiata corresponding to the at the time subacute infarct shown on
07/27/2019 MRI.

Periventricular white matter and corona radiata hypodensities favor
chronic ischemic microvascular white matter disease.

Otherwise, the brainstem, cerebellum, cerebral peduncles, thalamus,
basal ganglia, basilar cisterns, and ventricular system appear
within normal limits. No intracranial hemorrhage, mass lesion, or
acute CVA.

Vascular: There is atherosclerotic calcification of the cavernous
carotid arteries bilaterally. Vertebral artery atherosclerotic
calcification.

Skull: Unremarkable

Sinuses/Orbits: Mild chronic left sphenoid, left maxillary, and
ethmoid sinusitis.

Other: No supplemental non-categorized findings.
IMPRESSION: 1. Evolutionary findings in the small white matter infarcts in the
left occipital lobe right parietal lobe, currently hypodense
indicating increased chronicity. Both of these were subacute at the
time of prior brain MRI of 07/27/2019.
[DATE]. Old right cerebellar infarct.
3. Periventricular white matter and corona radiata hypodensities
favor chronic ischemic microvascular white matter disease.
4. No acute findings.
5. Atherosclerosis.
6. Mild chronic paranasal sinusitis.

## 2020-04-29 IMAGING — DX DG PORTABLE PELVIS
1 series · 1 of 1 positions shown · non-contrast
Comparison: 07/25/2019

CLINICAL DATA: Found unresponsive

EXAM:
PORTABLE PELVIS 1-2 VIEWS

[pelvis ap]
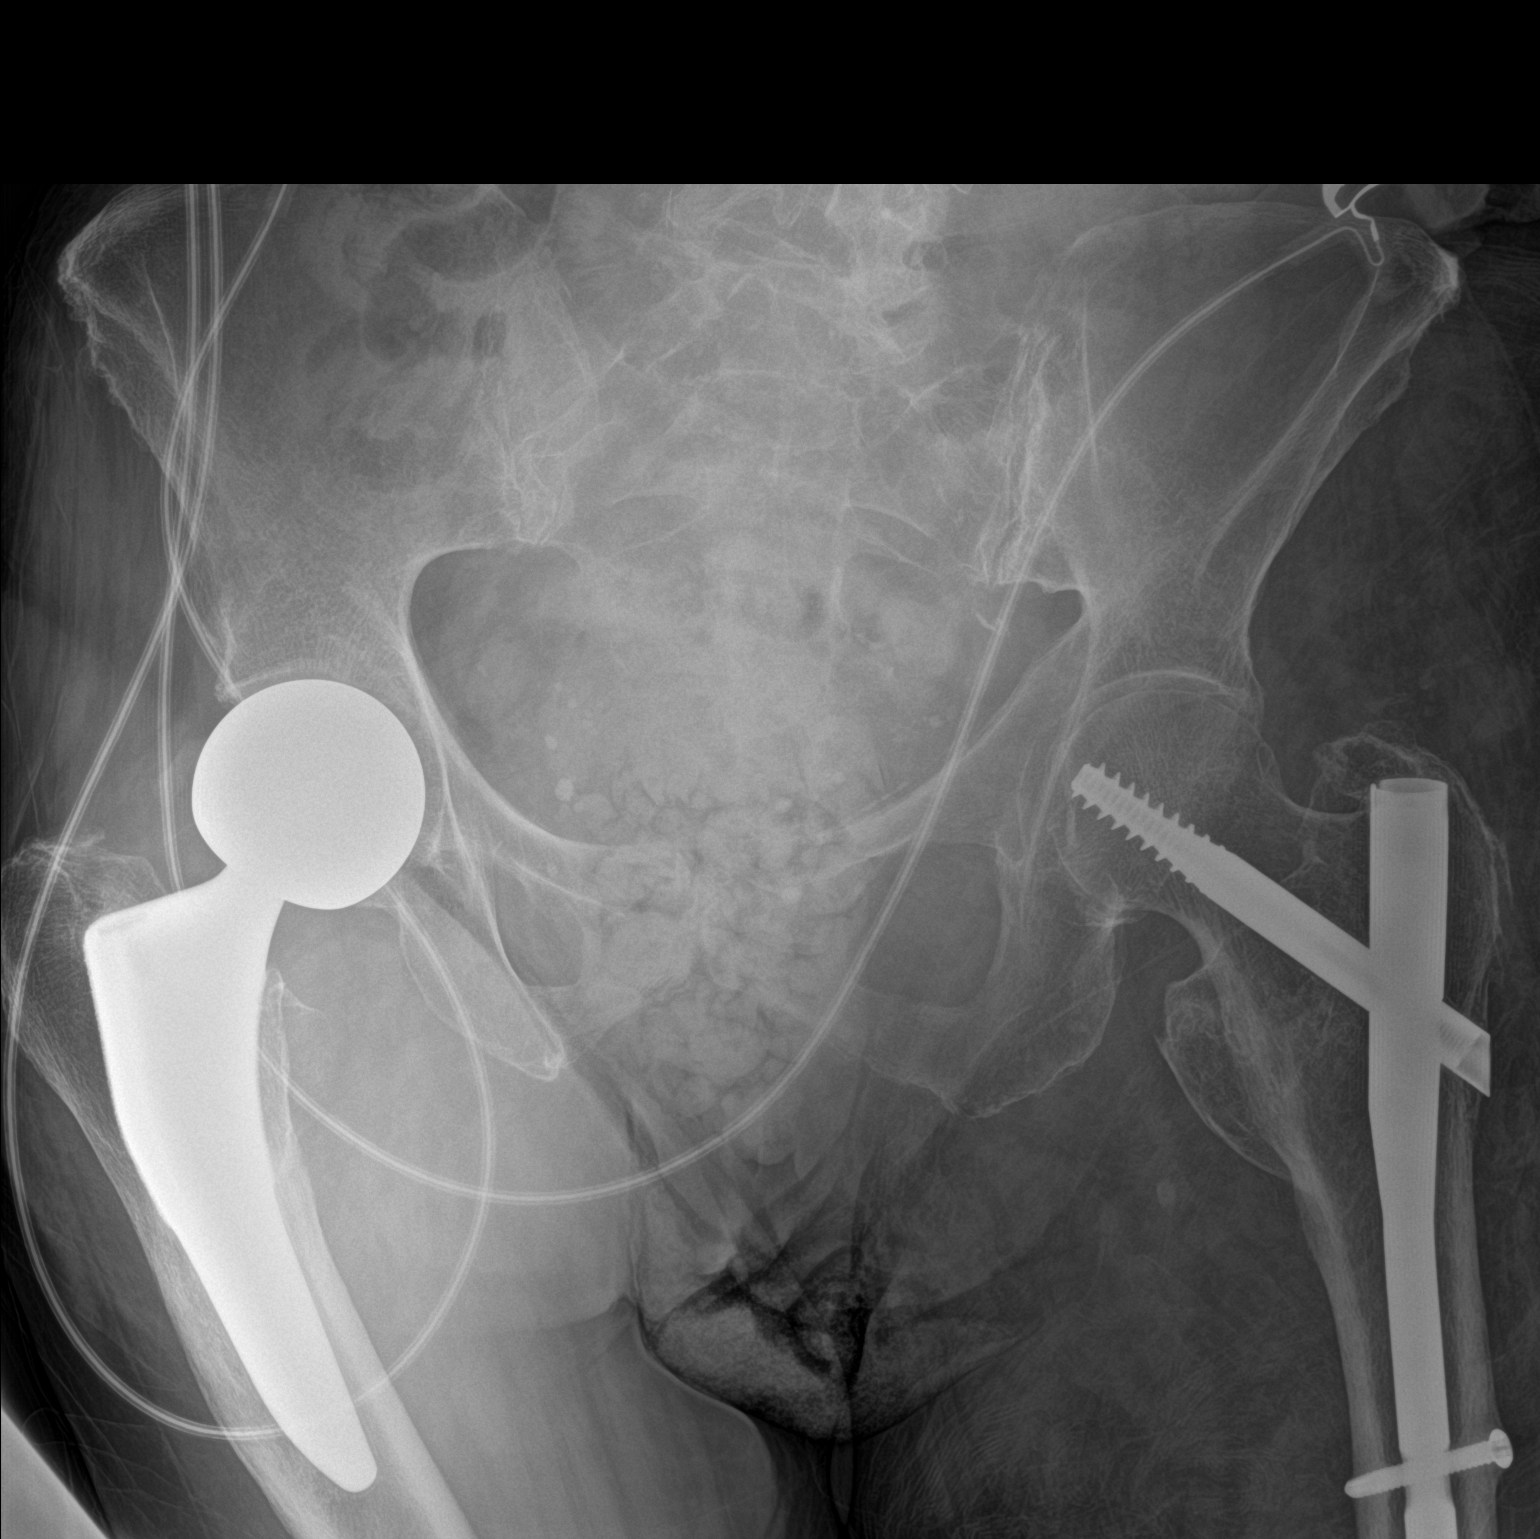

[1 of 1 positions shown; findings below may reference images not displayed]

FINDINGS: Pelvic ring is intact. Prior right hip replacement and surgical
fixation of the proximal left femur are seen and stable. No fracture
or hardware failure is noted. Degenerative changes of lumbar spine
are seen.
IMPRESSION: Postsurgical changes without acute abnormality.

## 2020-04-30 IMAGING — DX DG CHEST 1V PORT
1 series · 1 of 1 positions shown · non-contrast
Comparison: CT 08/31/2019, radiograph 08/31/2019

CLINICAL DATA: Sepsis, NOS

EXAM:
PORTABLE CHEST 1 VIEW

[chest ap]
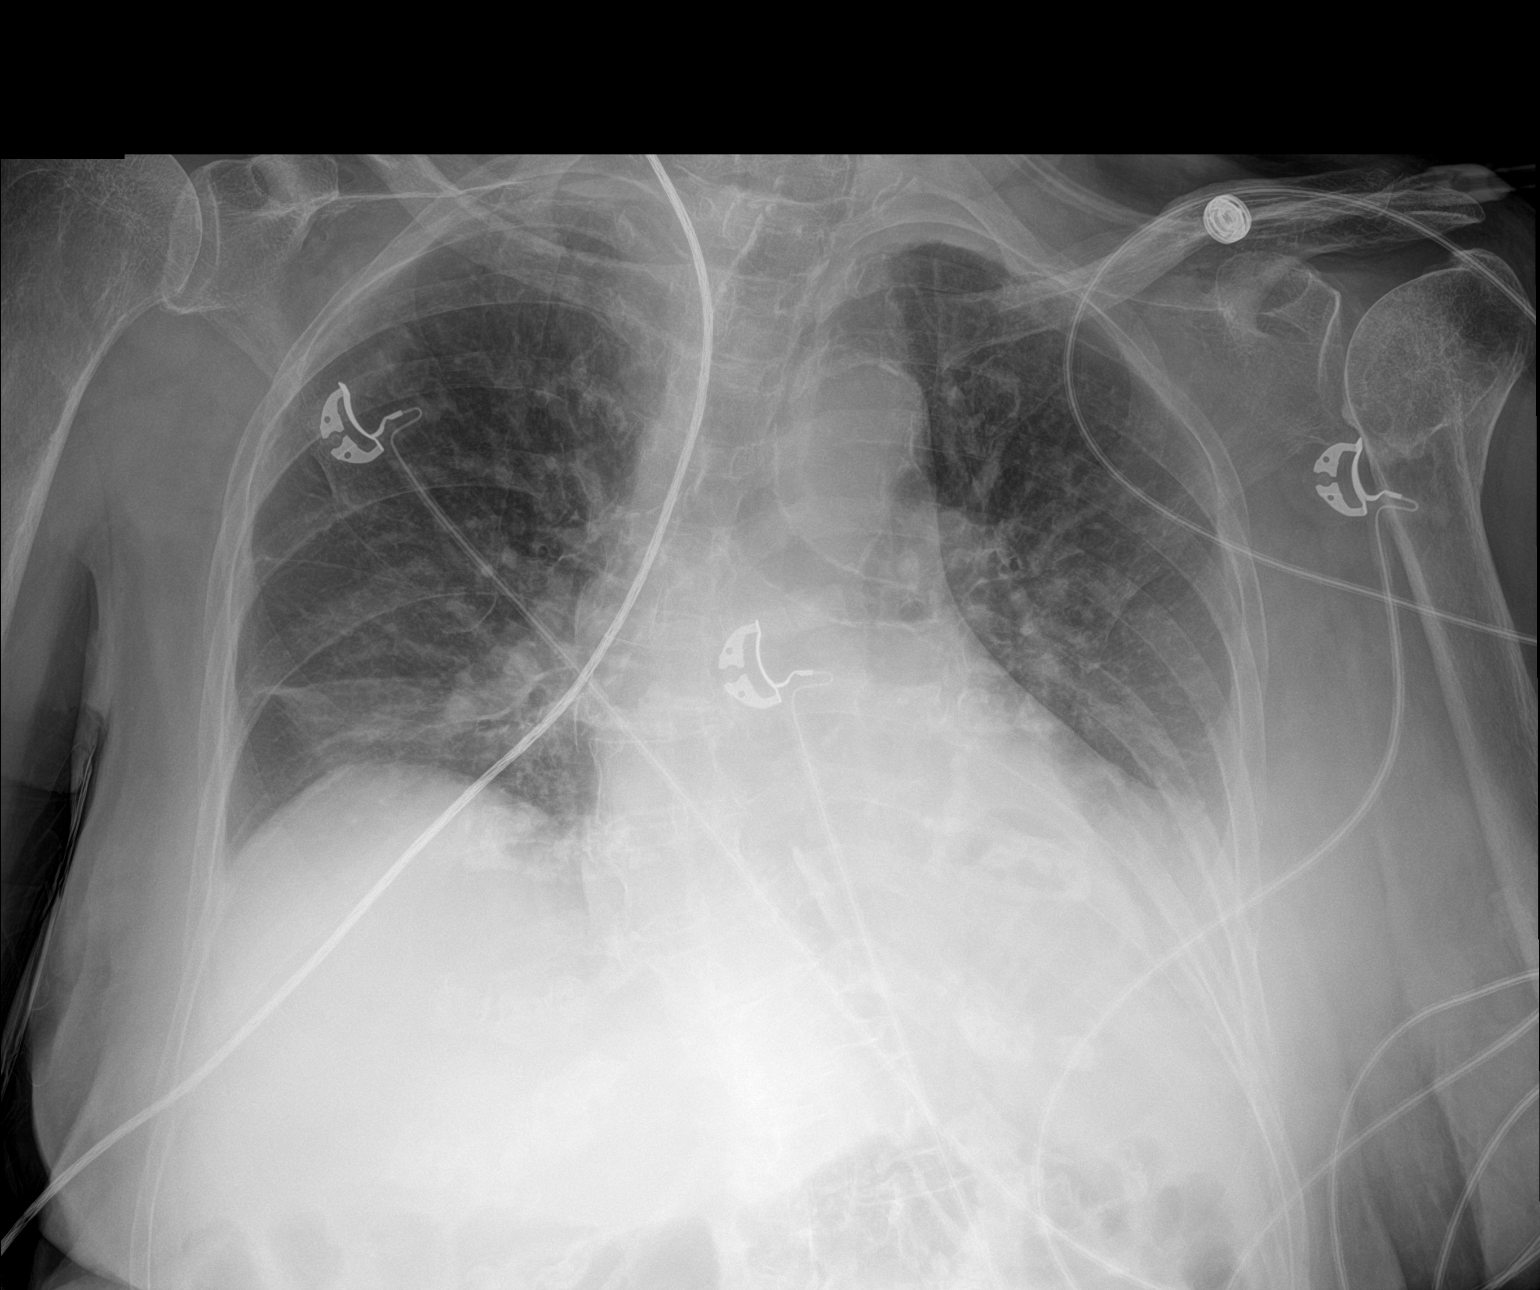

[1 of 1 positions shown; findings below may reference images not displayed]

FINDINGS: Support devices overlie the chest.

Lung volumes are diminished with new bandlike areas of atelectasis
throughout both lungs. Furthermore there is increasing hazy
interstitial opacity with some cephalization of the pulmonary
vascularity and fissural and septal thickening to suggest developing
pulmonary edema. There is gradient opacity in the left lung base
which could reflect a layering effusion. Cardiomediastinal contours
are are similar to priors accounting for differences technique
including a calcified, tortuous aorta.

No acute osseous or soft tissue abnormality. Remote posttraumatic
deformity of the left humeral head. Severe dextrocurvature of the
thoracic spine.
IMPRESSION: 1. Low lung volumes with increasing bandlike areas of atelectasis
throughout both lungs.
2. Radiographic features suggest some developing interstitial edema
and layering left effusion.
3. Large hiatal hernia.
4.  Aortic Atherosclerosis (1IVID-WPT.T).

## 2020-04-30 IMAGING — CR DG FEMUR 2+V*L*
5 series · 5 of 5 positions shown · non-contrast
Comparison: Left femur x-rays dated July 26, 2019.

CLINICAL DATA: Left hip pain.

EXAM:
LEFT FEMUR 2 VIEWS

[femur ap (1 of 3)]
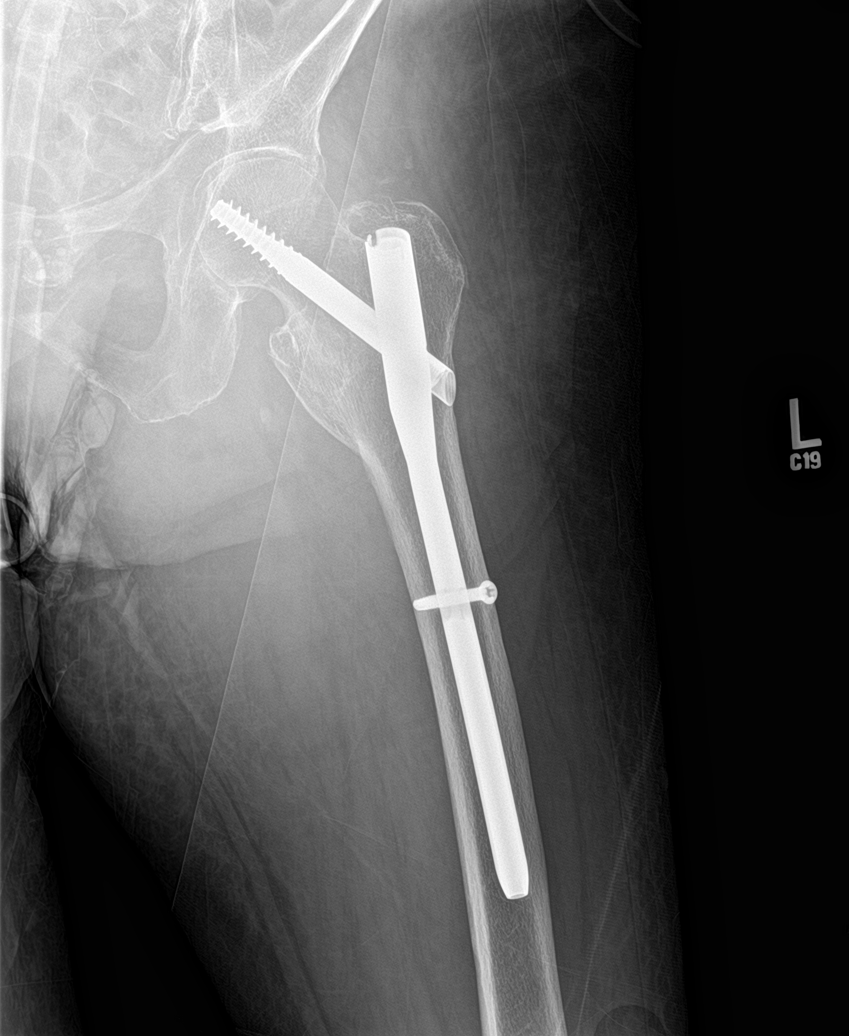

[femur ap (2 of 3)]
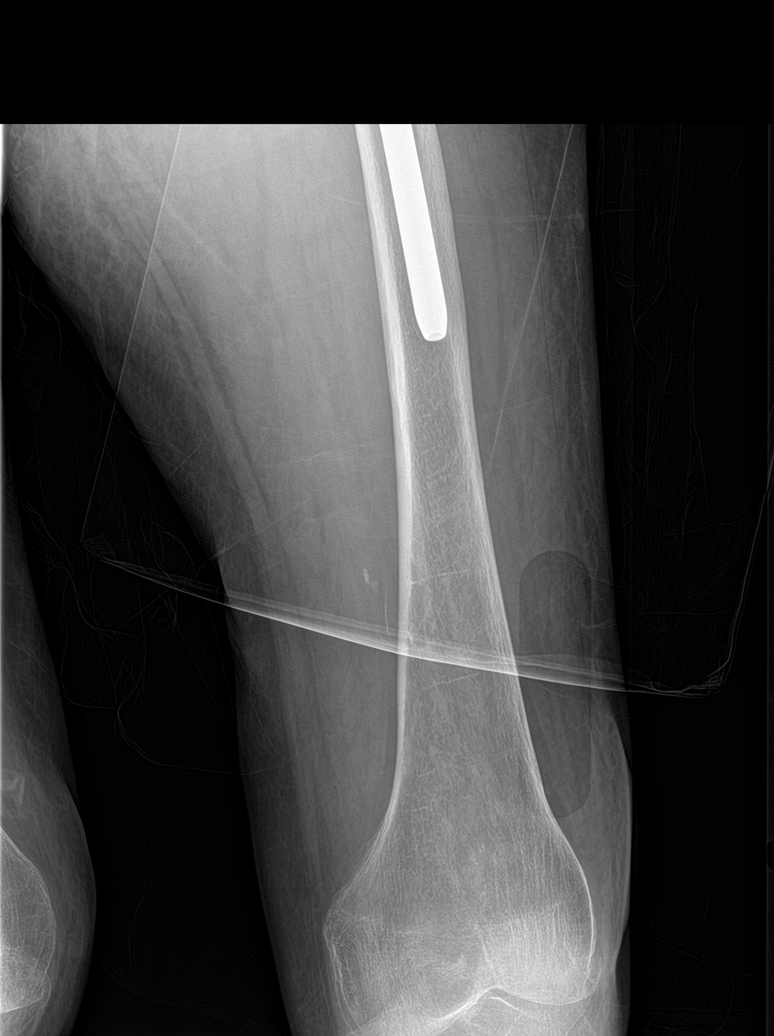

[femur lat (1 of 2)]
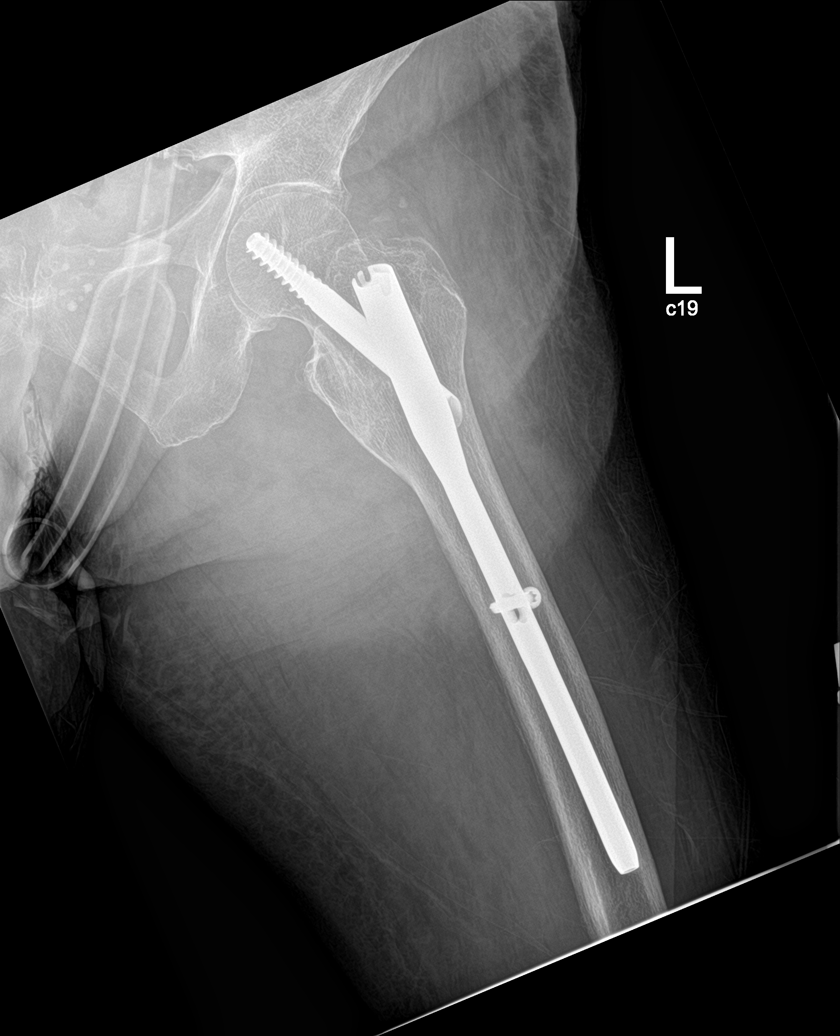

[femur lat (2 of 2)]
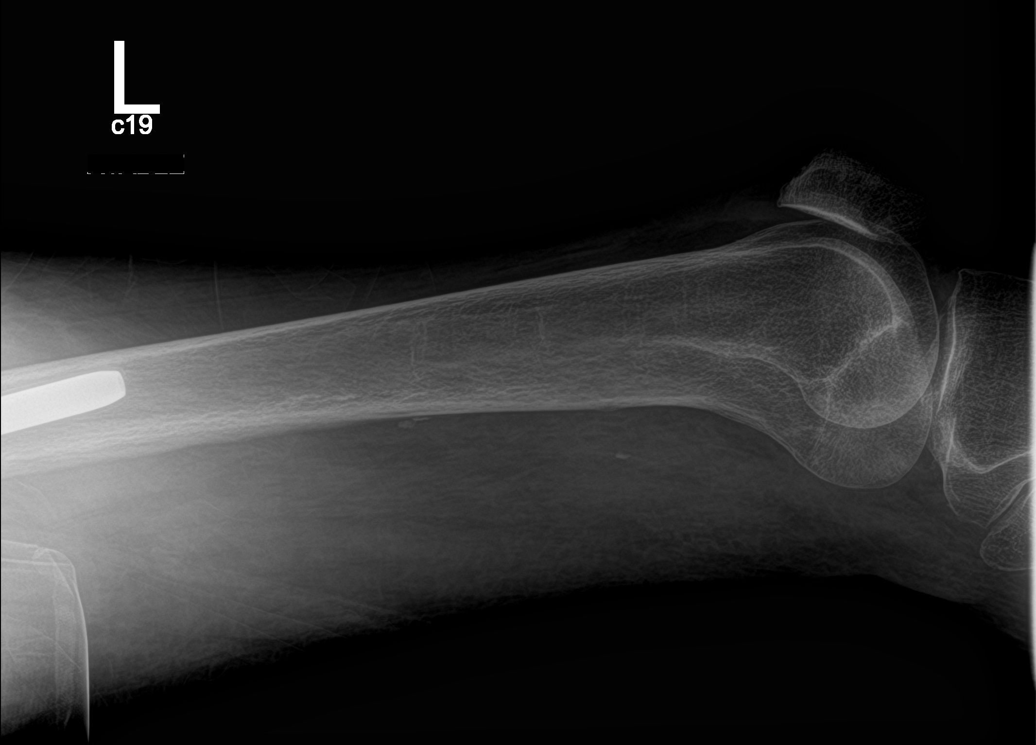

[femur ap (3 of 3)]
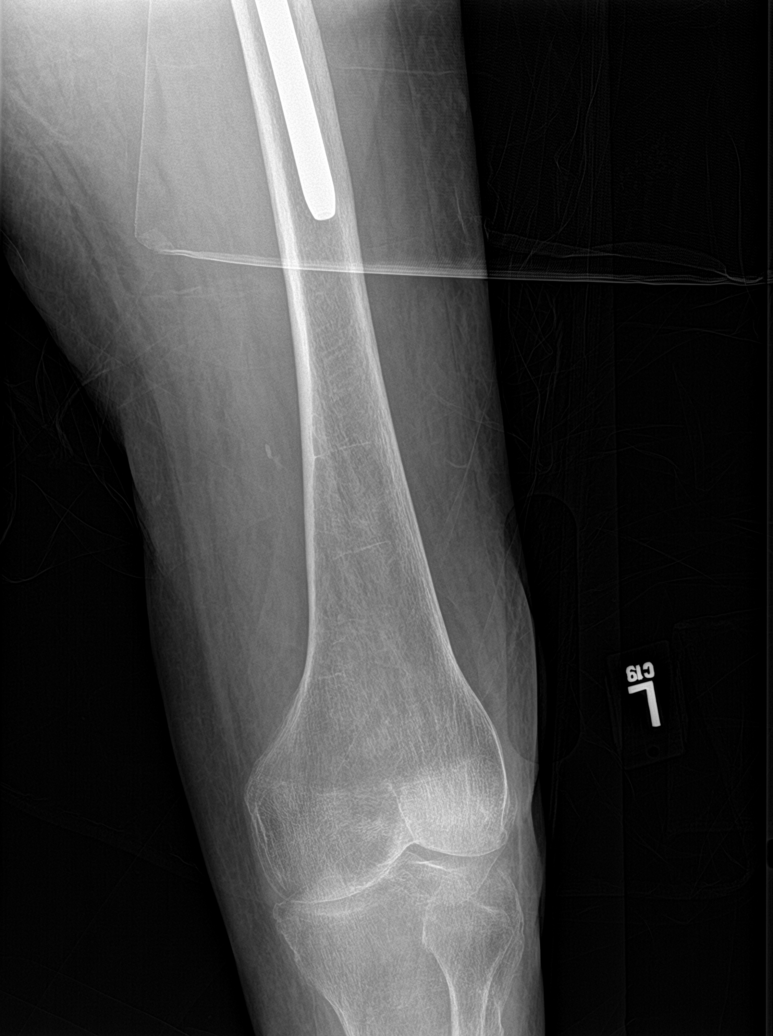

[5 of 5 positions shown; findings below may reference images not displayed]

FINDINGS: No acute fracture or dislocation. Healed left intertrochanteric
fracture status post gamma nail fixation. No evidence of hardware
failure or loosening. The left hip joint space is preserved.
Osteopenia. Soft tissues are unremarkable.
IMPRESSION: 1.  No acute osseous abnormality.
2. Healed left intertrochanteric fracture status post ORIF.

## 2020-04-30 IMAGING — CT CT HIP*L* W/O CM
2 of 3 series · 17 of 46 positions shown, 19 images · non-contrast
Comparison: X-ray from the same day.  CT dated August 31, 2019.

CLINICAL DATA: Hip pain.

EXAM:
CT OF THE LEFT HIP WITHOUT CONTRAST
TECHNIQUE: Multidetector CT imaging of the left hip was performed according to
the standard protocol. Multiplanar CT image reconstructions were
also generated.

[Series 5: hip 2.0 (person_name) (person_name) · axial · 0.50mm/px · z∈[-346,-52]mm · 14 of 169 slices shown, 16 images]
[im 11/169  soft-tissue]
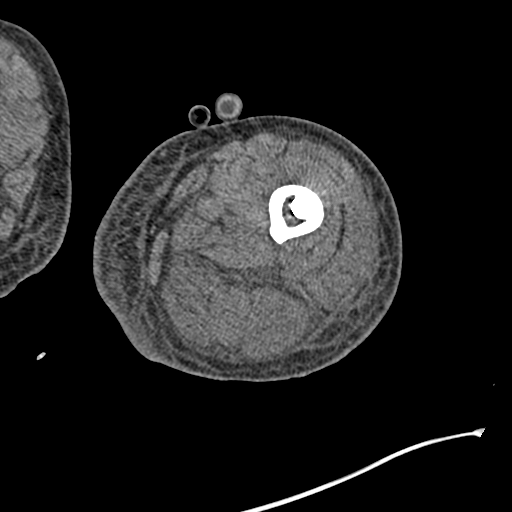
[im 11/169  bone]
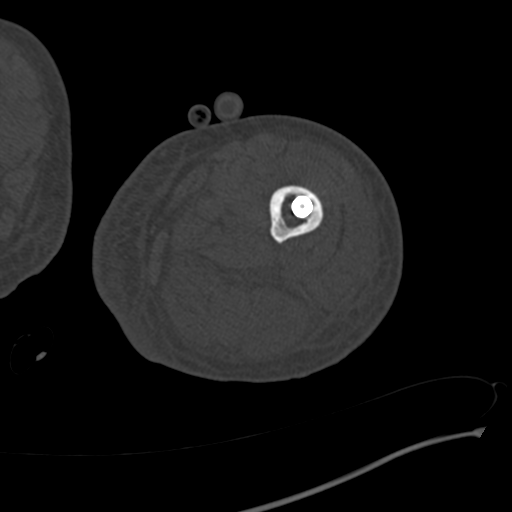
[im 22/169  soft-tissue]
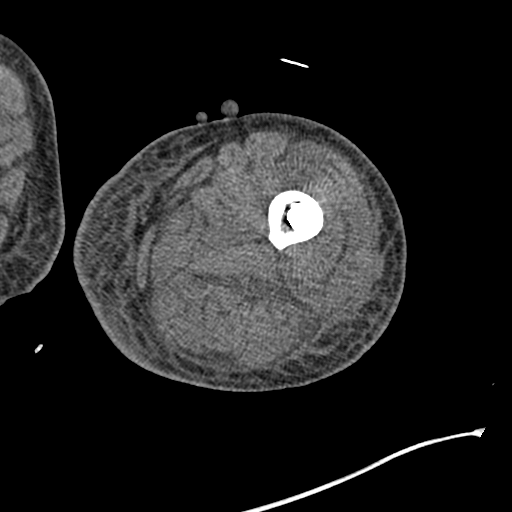
[im 33/169  soft-tissue]
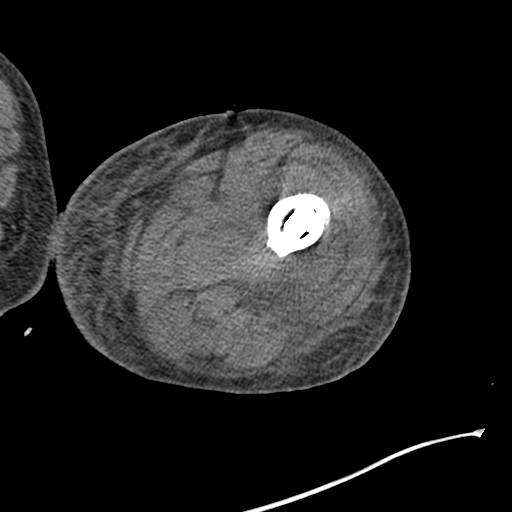
[im 44/169  soft-tissue]
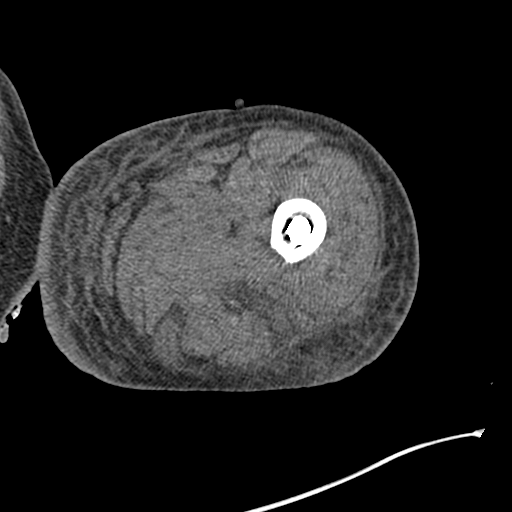
[im 55/169  soft-tissue]
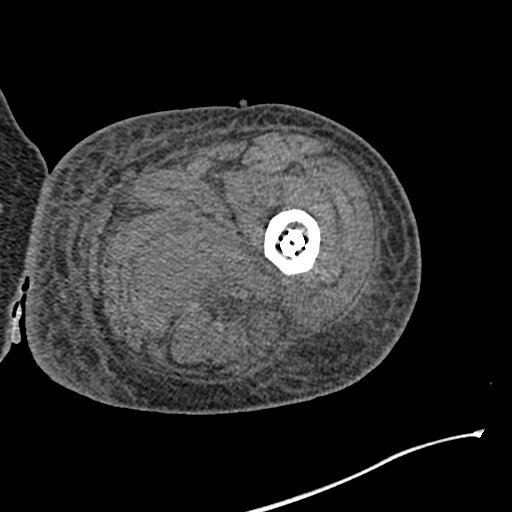
[im 66/169  soft-tissue]
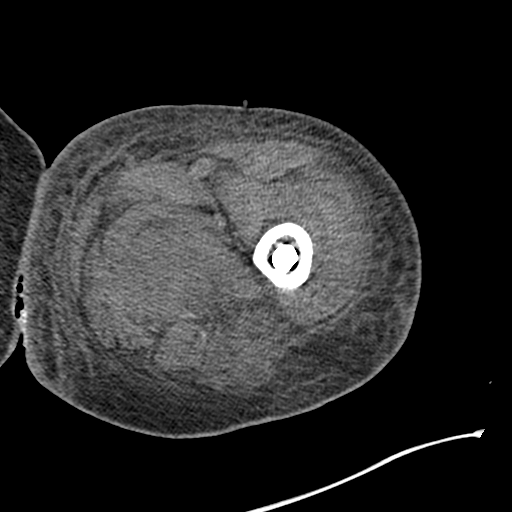
[im 76/169  soft-tissue]
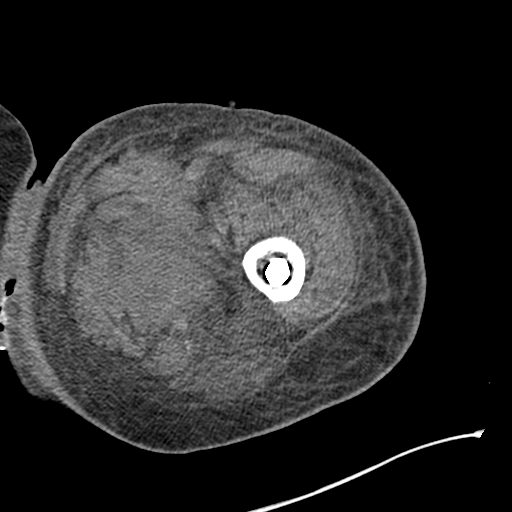
[im 93/169  soft-tissue]
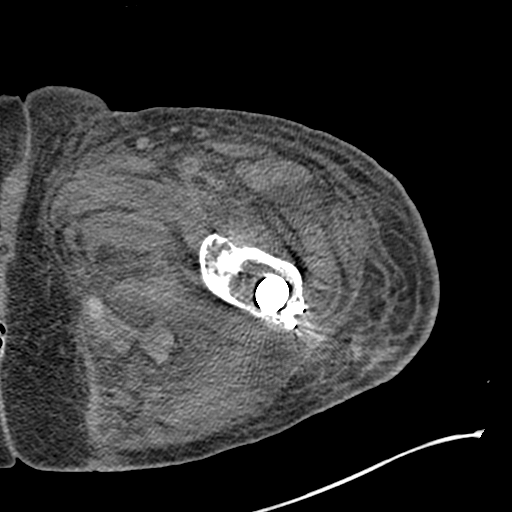
[im 103/169  soft-tissue]
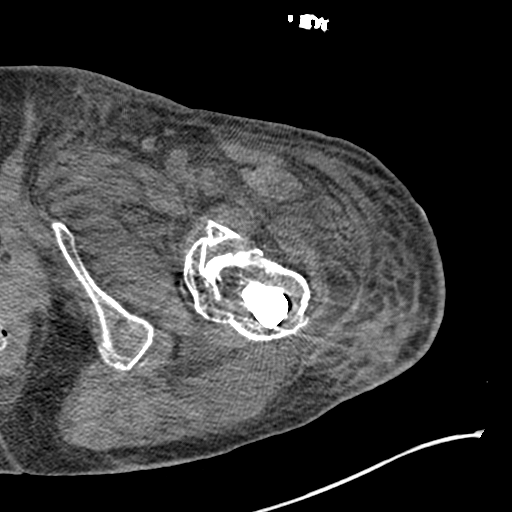
[im 103/169  bone]
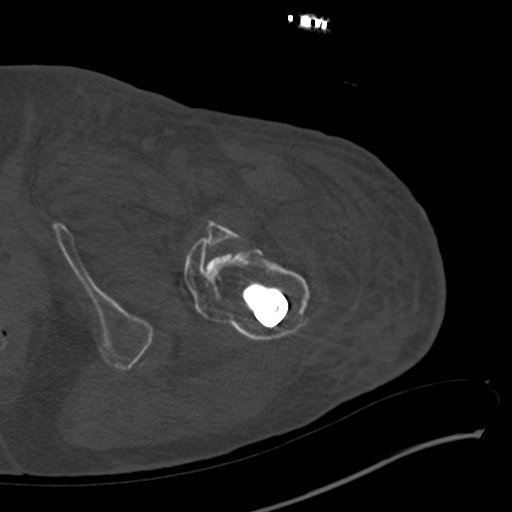
[im 114/169  soft-tissue]
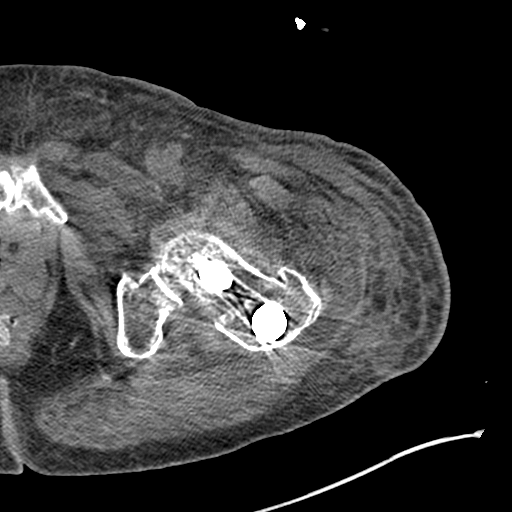
[im 125/169  soft-tissue]
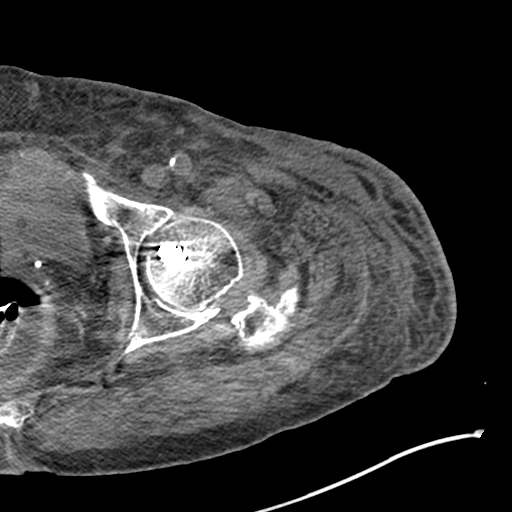
[im 136/169  soft-tissue]
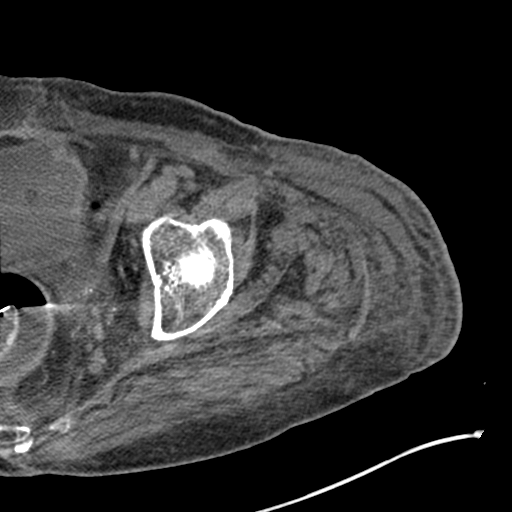
[im 147/169  soft-tissue]
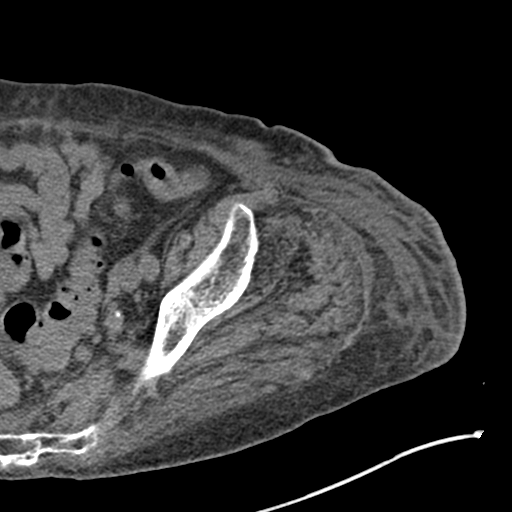
[im 158/169  soft-tissue]
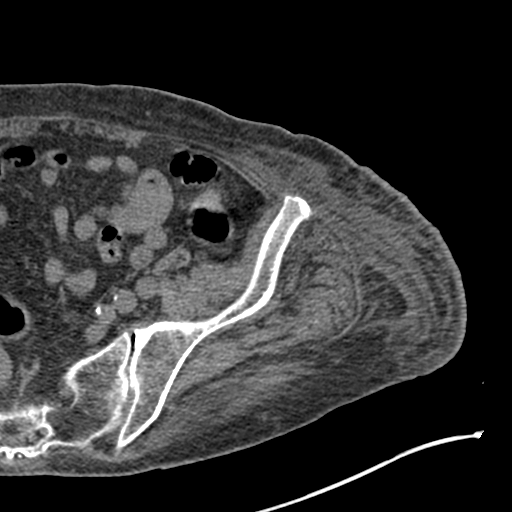

[Series 9: hip 2.0 cor. st · coronal · 0.55mm/px · 3 of 98 slices shown]
[im 33/98  soft-tissue]
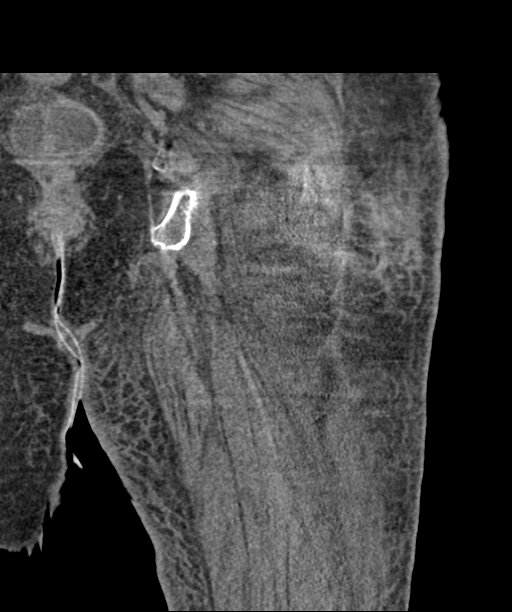
[im 44/98  soft-tissue]
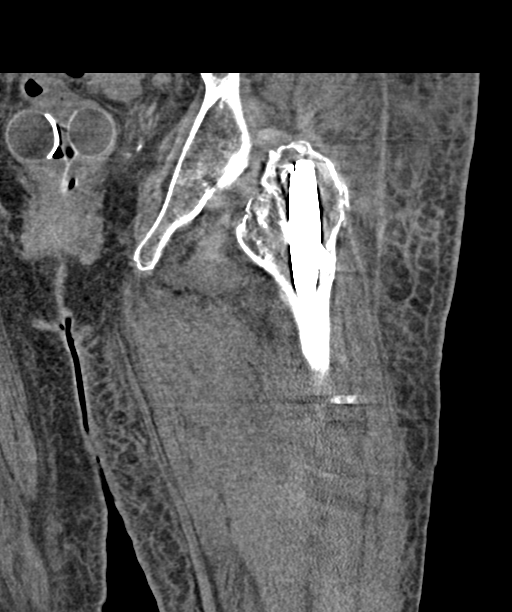
[im 54/98  soft-tissue]
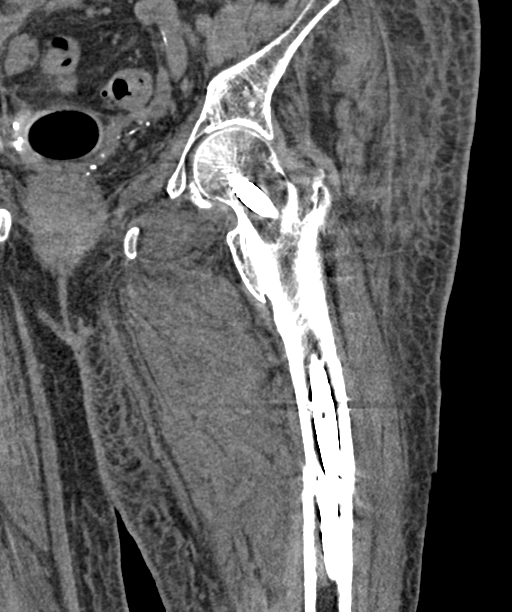

[17 of 46 positions shown; findings below may reference images not displayed]

FINDINGS: Bones/Joint/Cartilage

The patient is status post placement of an intramedullary nail for a
known intratrochanteric fracture of the proximal left femur. This
fracture is healed. The hardware is intact. There is a nondisplaced
fracture involving the parasymphyseal left pubic bone. There is an
age-indeterminate fracture through the anterior column of the left
acetabulum. The patient's femoral screw nearly extends beyond the
cortex into the joint space.

Ligaments

Suboptimally assessed by CT.

Muscles and Tendons

There is some edema extending through the myofascial planes of the
left lower extremity.

Soft tissues

There is nonspecific lower extremity edema involving the left lower
extremity. A rectal tube is in place. A Foley catheter is in place.
IMPRESSION: 1. Nondisplaced fracture of the left parasymphyseal pubic bone.
2. Age-indeterminate nondisplaced fracture involving the anterior
column of the left acetabulum. The appearance favors a subacute
fracture.
3. Stable orthopedic hardware involving the left lower extremity.
4. Nonspecific soft tissue swelling about the left lower extremity.

## 2020-04-30 IMAGING — CR DG PELVIS 1-2V
1 series · 1 of 1 positions shown · non-contrast
Comparison: August 31, 2019

CLINICAL DATA: Left hip pain post fall.

EXAM:
PELVIS - 1-2 VIEW

[pelvis ap]
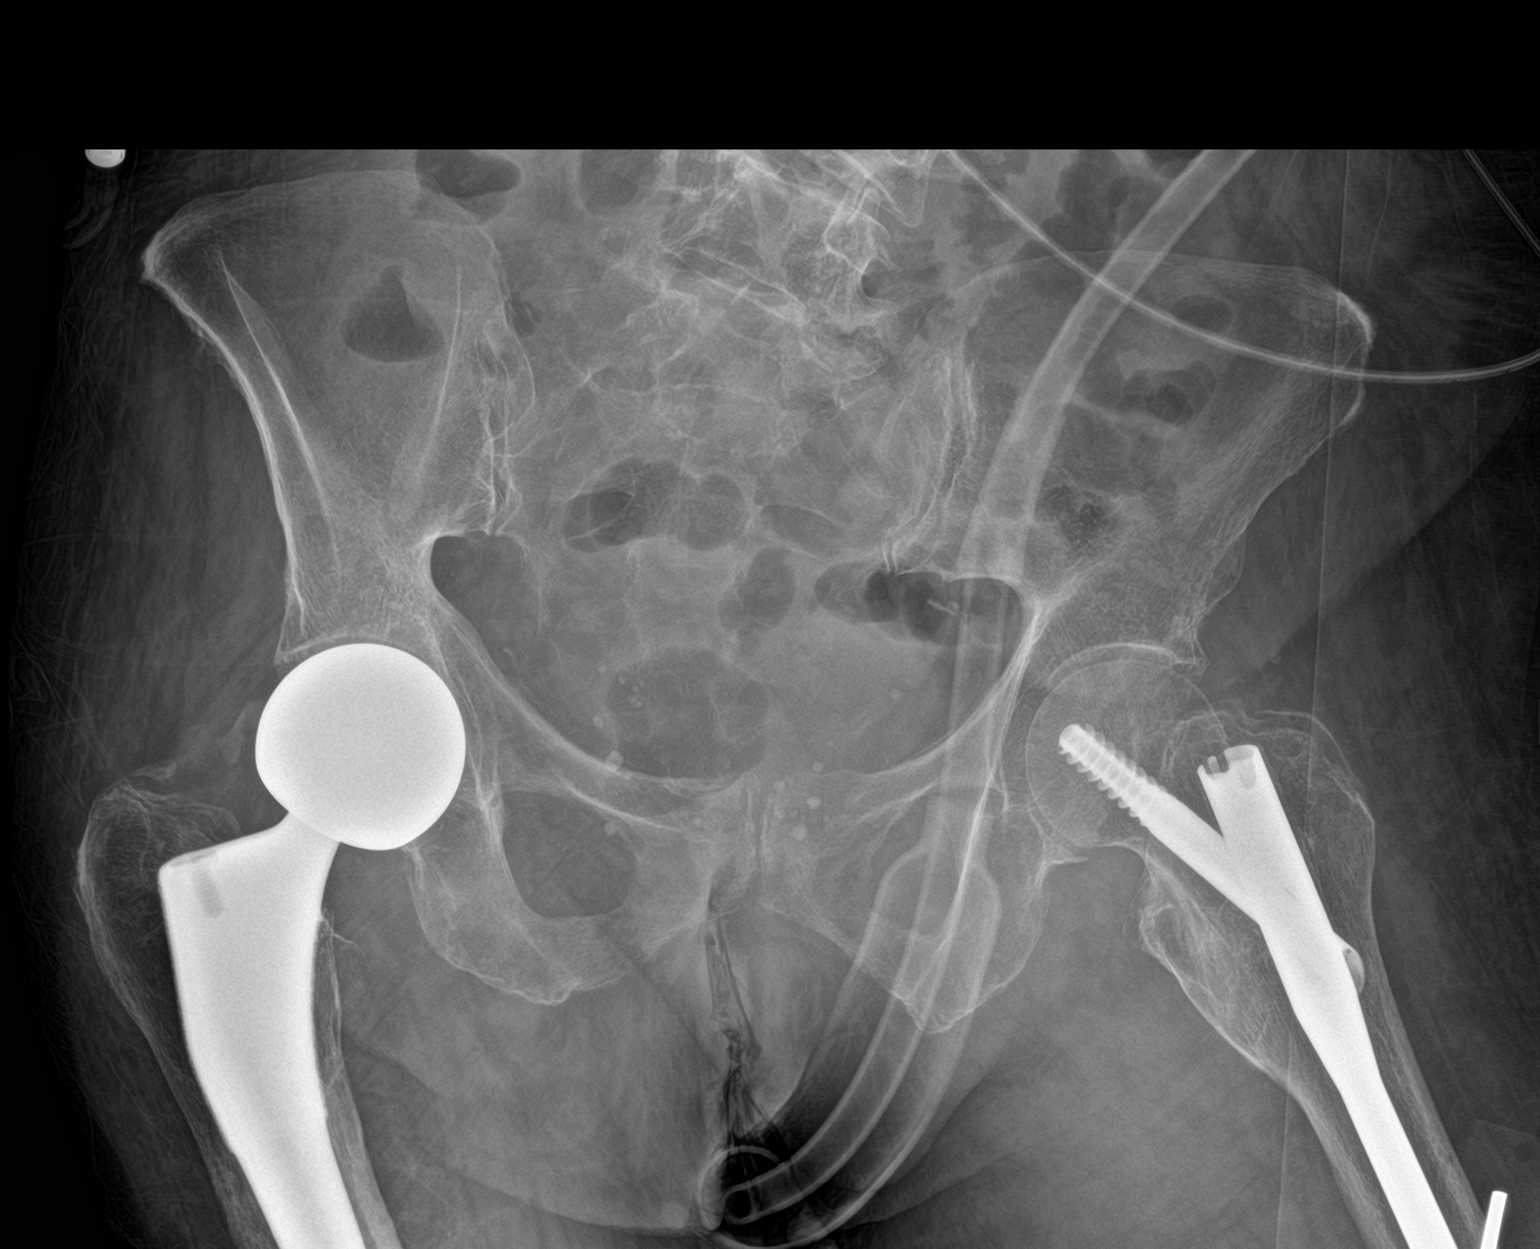

[1 of 1 positions shown; findings below may reference images not displayed]

FINDINGS: Limited single view of the left hip, with internal rotation of the
left femur. Post intramedullary nail fixation of the left femur. No
evidence of femoral head fracture. Fracture along the greater
trochanter is difficult to exclude.

Prior total right hip arthroplasty, with intact components.
IMPRESSION: 1. Limited single view of the left hip, with internal rotation of
the left femur.
2. Fracture along the greater trochanter is difficult to exclude.
3. Hardware intact.

## 2020-05-10 IMAGING — DX DG FOOT COMPLETE 3+V*L*
2 series · 3 of 3 positions shown · non-contrast
Comparison: None.

CLINICAL DATA: Left foot pain for days.

EXAM:
LEFT FOOT - COMPLETE 3+ VIEW

[Series 1: foot · 0.14mm/px · 2 of 2 slices shown]
[im 1/2]
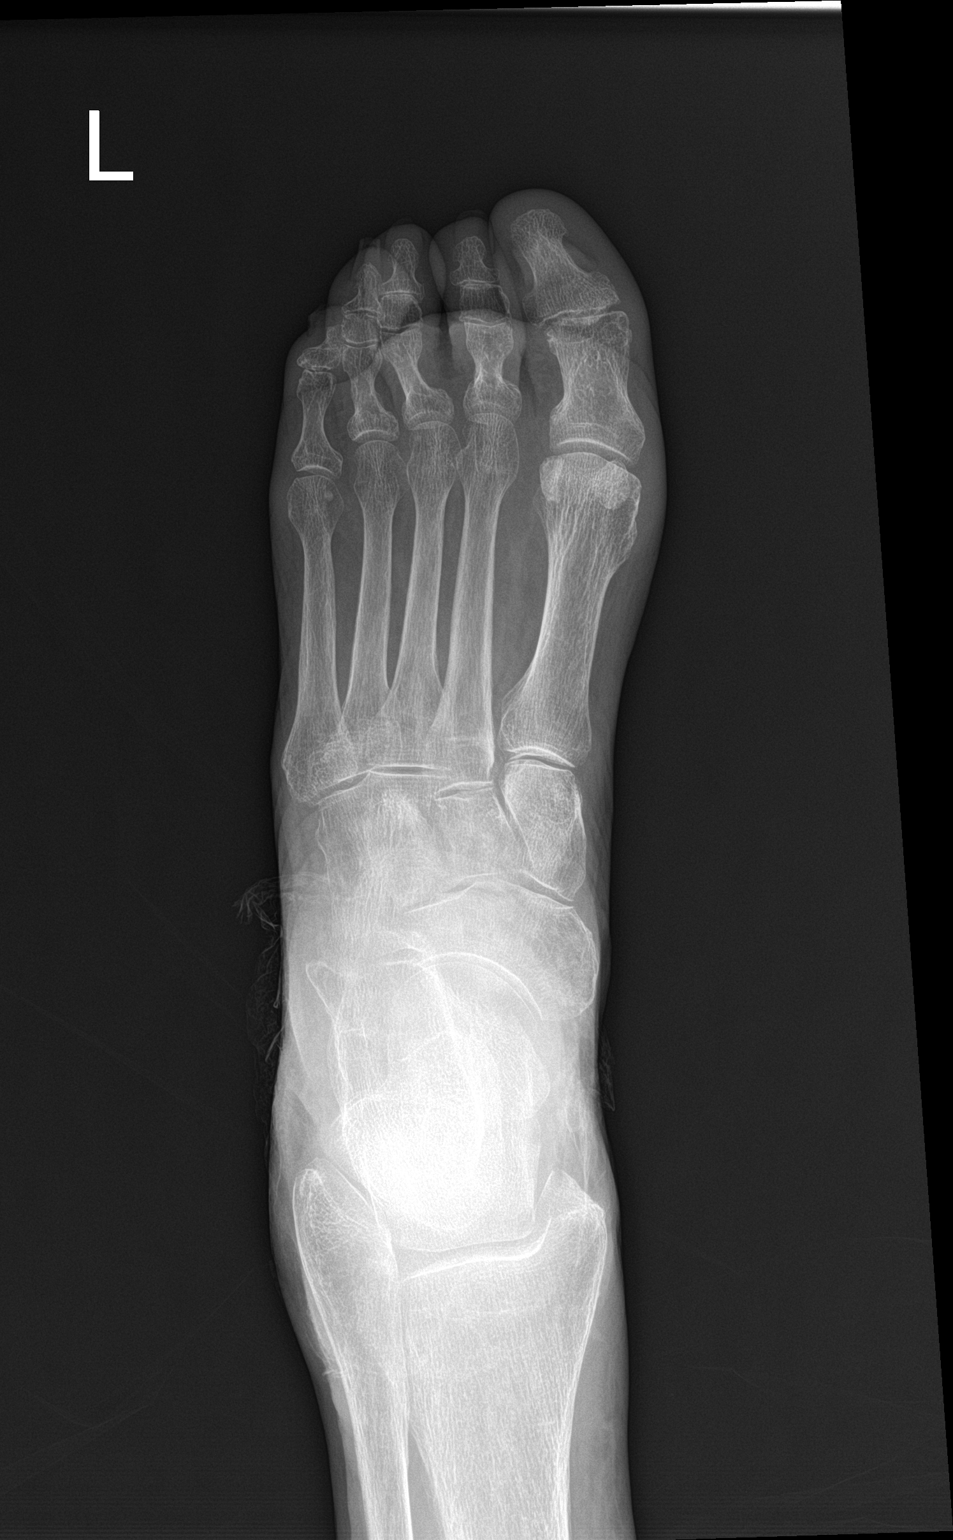
[im 2/2]
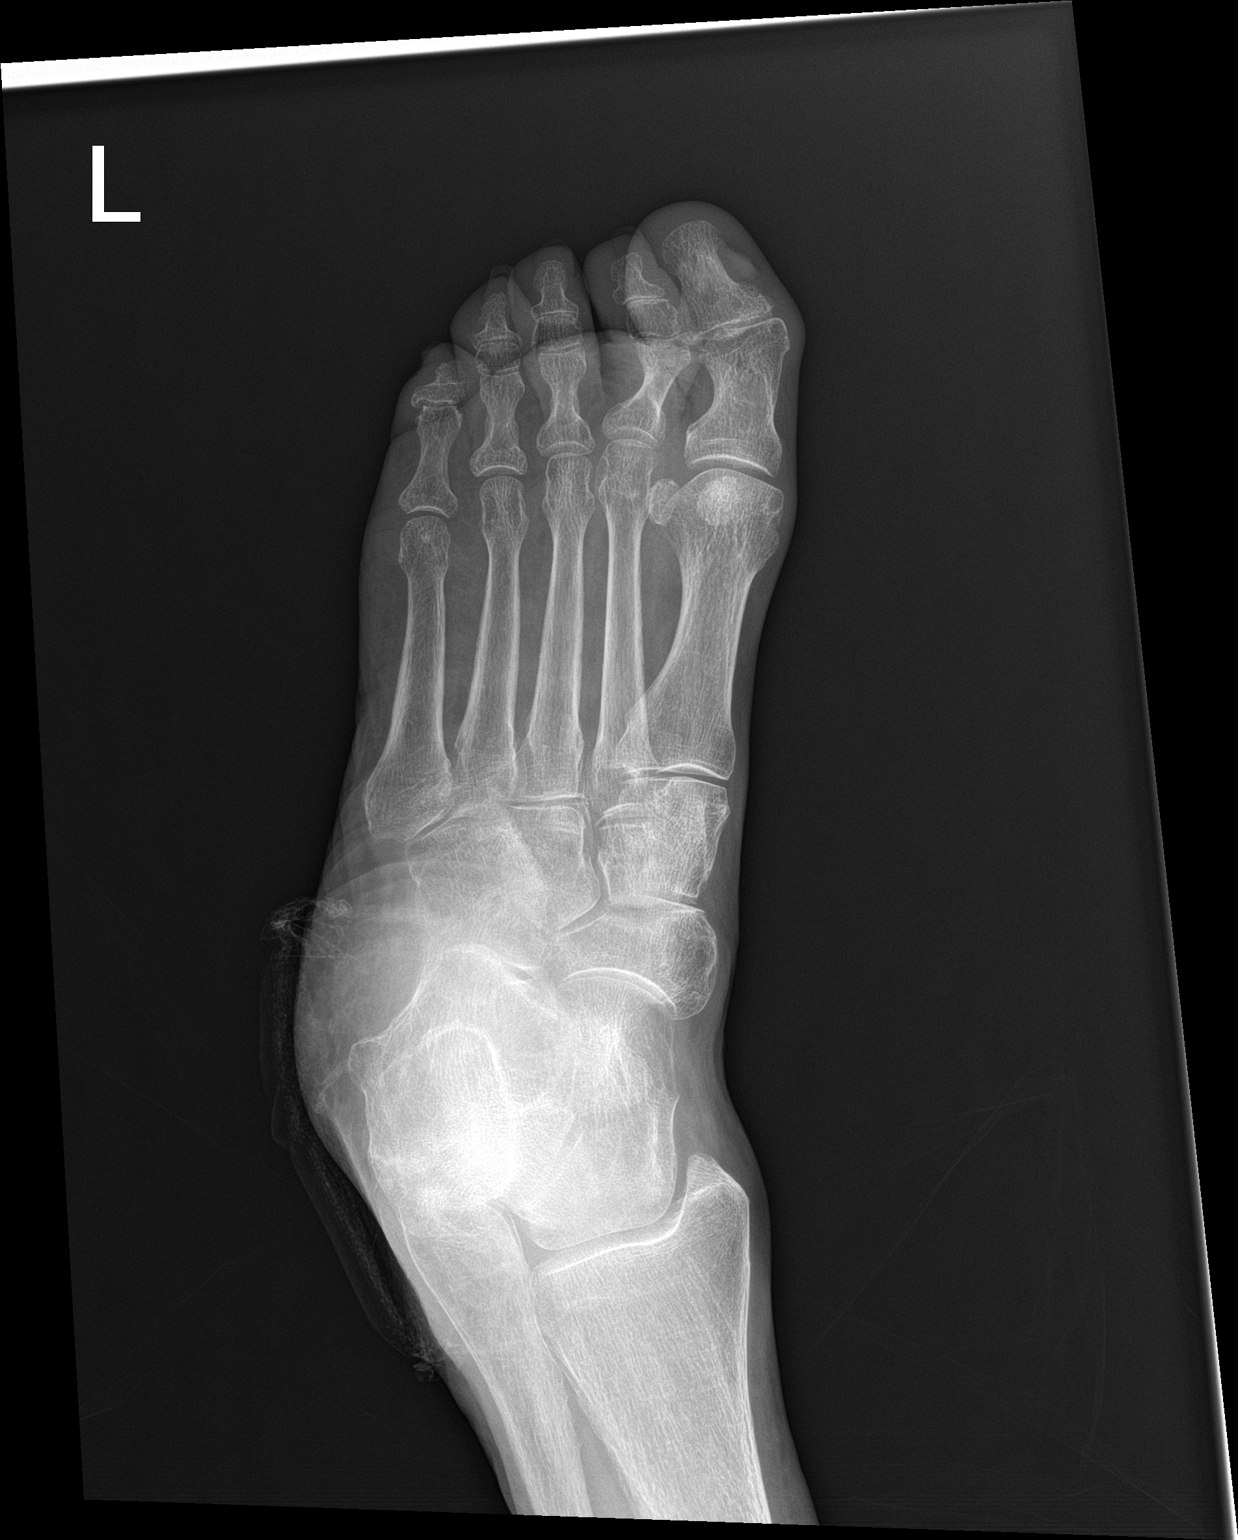

[leg]
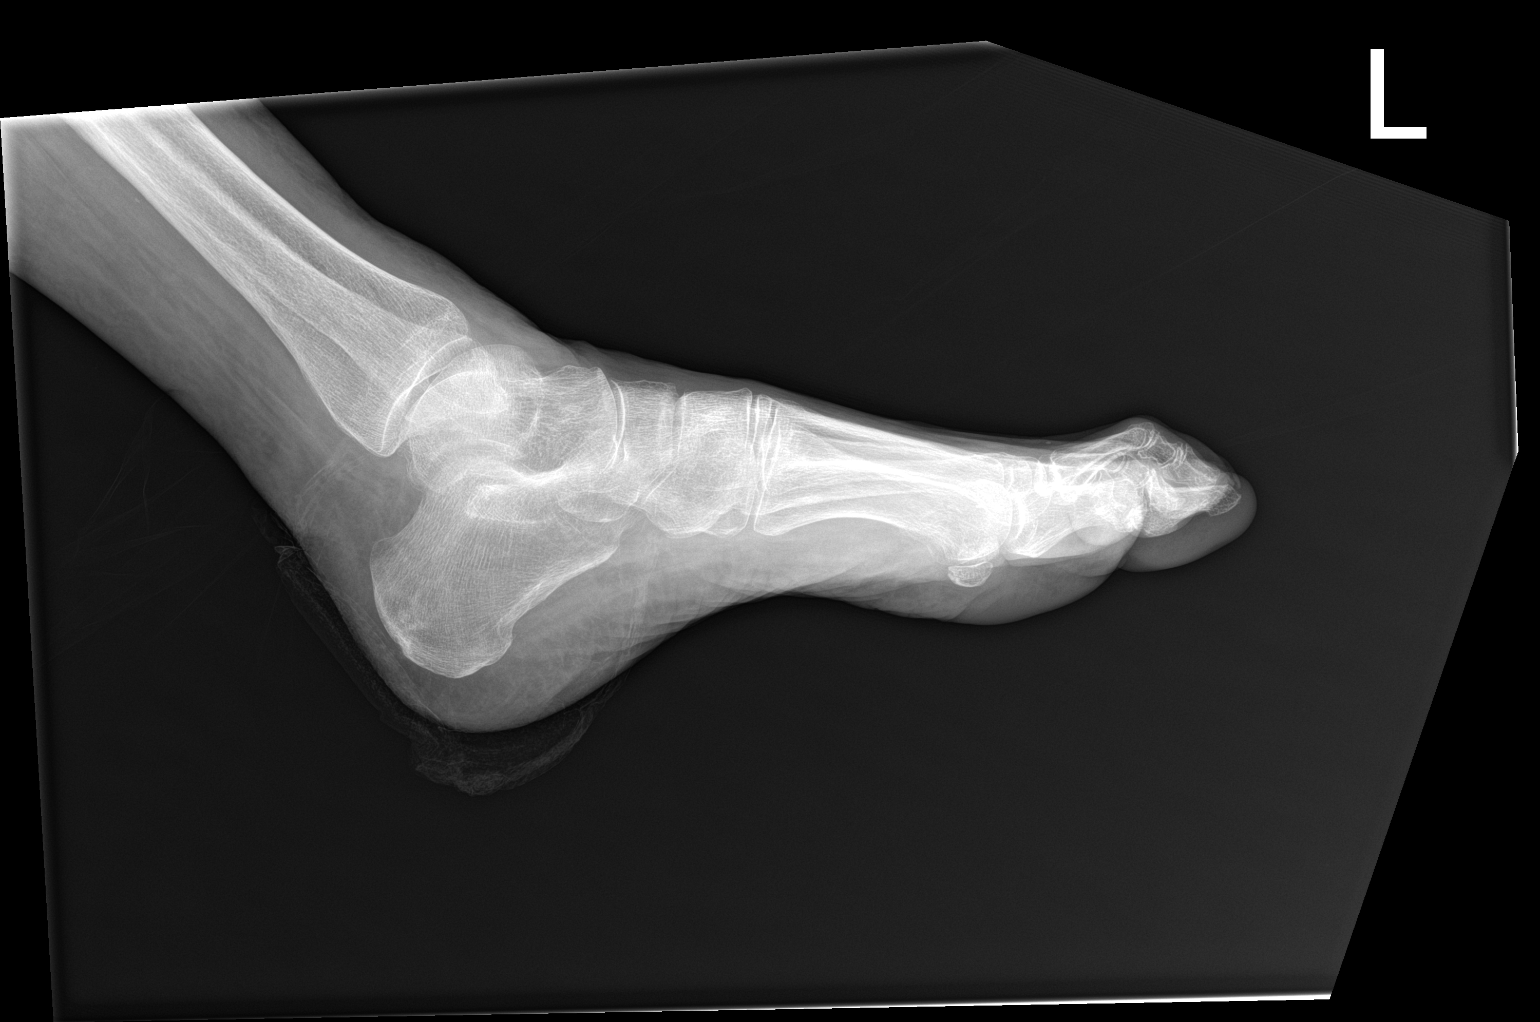

[3 of 3 positions shown; findings below may reference images not displayed]

FINDINGS: There is no evidence of fracture or dislocation. Bones are diffusely
under mineralized. Mild hammertoe deformity of the digits. Mild
multifocal osteoarthritis, most prominent involving the great toe.
No evidence of inflammatory arthropathy or bony destructive change.
Soft tissues are unremarkable.
IMPRESSION: Mild multifocal osteoarthritis, most prominent involving the great
toe. No acute osseous abnormality.

## 2020-05-12 ENCOUNTER — Other Ambulatory Visit: Payer: Self-pay

## 2020-05-12 ENCOUNTER — Emergency Department (HOSPITAL_COMMUNITY): Payer: Medicare Other

## 2020-05-12 ENCOUNTER — Inpatient Hospital Stay (HOSPITAL_COMMUNITY)
Admission: EM | Admit: 2020-05-12 | Discharge: 2020-05-22 | DRG: 917 | Disposition: A | Discharge status: Skilled Nursing Facility | Payer: Medicare Other | Attending: Internal Medicine | Admitting: Internal Medicine

## 2020-05-12 ENCOUNTER — Encounter (HOSPITAL_COMMUNITY): Payer: Self-pay | Admitting: Emergency Medicine

## 2020-05-12 DIAGNOSIS — M810 Age-related osteoporosis without current pathological fracture: Secondary | ICD-10-CM | POA: Diagnosis present

## 2020-05-12 DIAGNOSIS — Z79891 Long term (current) use of opiate analgesic: Secondary | ICD-10-CM

## 2020-05-12 DIAGNOSIS — R54 Age-related physical debility: Secondary | ICD-10-CM | POA: Diagnosis present

## 2020-05-12 DIAGNOSIS — T402X1A Poisoning by other opioids, accidental (unintentional), initial encounter: Secondary | ICD-10-CM | POA: Diagnosis not present

## 2020-05-12 DIAGNOSIS — W19XXXA Unspecified fall, initial encounter: Secondary | ICD-10-CM

## 2020-05-12 DIAGNOSIS — Z7982 Long term (current) use of aspirin: Secondary | ICD-10-CM

## 2020-05-12 DIAGNOSIS — Z86711 Personal history of pulmonary embolism: Secondary | ICD-10-CM

## 2020-05-12 DIAGNOSIS — Z96641 Presence of right artificial hip joint: Secondary | ICD-10-CM | POA: Diagnosis present

## 2020-05-12 DIAGNOSIS — Z882 Allergy status to sulfonamides status: Secondary | ICD-10-CM

## 2020-05-12 DIAGNOSIS — R0902 Hypoxemia: Secondary | ICD-10-CM | POA: Diagnosis not present

## 2020-05-12 DIAGNOSIS — E876 Hypokalemia: Secondary | ICD-10-CM | POA: Diagnosis present

## 2020-05-12 DIAGNOSIS — Y92009 Unspecified place in unspecified non-institutional (private) residence as the place of occurrence of the external cause: Secondary | ICD-10-CM

## 2020-05-12 DIAGNOSIS — W010XXA Fall on same level from slipping, tripping and stumbling without subsequent striking against object, initial encounter: Secondary | ICD-10-CM | POA: Diagnosis present

## 2020-05-12 DIAGNOSIS — R197 Diarrhea, unspecified: Secondary | ICD-10-CM | POA: Diagnosis present

## 2020-05-12 DIAGNOSIS — D649 Anemia, unspecified: Secondary | ICD-10-CM | POA: Diagnosis present

## 2020-05-12 DIAGNOSIS — Z79899 Other long term (current) drug therapy: Secondary | ICD-10-CM

## 2020-05-12 DIAGNOSIS — J9601 Acute respiratory failure with hypoxia: Secondary | ICD-10-CM

## 2020-05-12 DIAGNOSIS — R7989 Other specified abnormal findings of blood chemistry: Secondary | ICD-10-CM | POA: Diagnosis present

## 2020-05-12 DIAGNOSIS — G8929 Other chronic pain: Secondary | ICD-10-CM | POA: Diagnosis present

## 2020-05-12 DIAGNOSIS — N179 Acute kidney failure, unspecified: Secondary | ICD-10-CM | POA: Diagnosis present

## 2020-05-12 DIAGNOSIS — F329 Major depressive disorder, single episode, unspecified: Secondary | ICD-10-CM | POA: Diagnosis present

## 2020-05-12 DIAGNOSIS — Z20822 Contact with and (suspected) exposure to covid-19: Secondary | ICD-10-CM | POA: Diagnosis present

## 2020-05-12 DIAGNOSIS — F341 Dysthymic disorder: Secondary | ICD-10-CM | POA: Diagnosis present

## 2020-05-12 DIAGNOSIS — I10 Essential (primary) hypertension: Secondary | ICD-10-CM | POA: Diagnosis present

## 2020-05-12 DIAGNOSIS — Z888 Allergy status to other drugs, medicaments and biological substances status: Secondary | ICD-10-CM

## 2020-05-12 DIAGNOSIS — Z8673 Personal history of transient ischemic attack (TIA), and cerebral infarction without residual deficits: Secondary | ICD-10-CM

## 2020-05-12 DIAGNOSIS — Z90710 Acquired absence of both cervix and uterus: Secondary | ICD-10-CM

## 2020-05-12 DIAGNOSIS — Z880 Allergy status to penicillin: Secondary | ICD-10-CM

## 2020-05-12 DIAGNOSIS — G894 Chronic pain syndrome: Secondary | ICD-10-CM | POA: Diagnosis present

## 2020-05-12 DIAGNOSIS — F419 Anxiety disorder, unspecified: Secondary | ICD-10-CM | POA: Diagnosis present

## 2020-05-12 DIAGNOSIS — Z87891 Personal history of nicotine dependence: Secondary | ICD-10-CM

## 2020-05-12 DIAGNOSIS — M549 Dorsalgia, unspecified: Secondary | ICD-10-CM | POA: Diagnosis present

## 2020-05-12 DIAGNOSIS — N3281 Overactive bladder: Secondary | ICD-10-CM | POA: Diagnosis present

## 2020-05-12 DIAGNOSIS — Z993 Dependence on wheelchair: Secondary | ICD-10-CM

## 2020-05-12 DIAGNOSIS — H9190 Unspecified hearing loss, unspecified ear: Secondary | ICD-10-CM | POA: Diagnosis present

## 2020-05-12 LAB — ETHANOL: Alcohol, Ethyl (B): <10 mg/dL (ref <10)

## 2020-05-12 LAB — CBC WITH DIFFERENTIAL/PLATELET
Abs Immature Granulocytes: 0.05 10*3/uL (ref 0.00–0.07)
Basophils Absolute: 0.1 10*3/uL (ref 0.0–0.1)
Basophils Relative: 0 %
Eosinophils Absolute: 0.3 10*3/uL (ref 0.0–0.5)
Eosinophils Relative: 3 %
HCT: 35.7 % — ABNORMAL LOW (ref 36.0–46.0)
Hemoglobin: 10.9 g/dL — ABNORMAL LOW (ref 12.0–15.0)
Immature Granulocytes: 0 %
Lymphocytes Relative: 11 %
Lymphs Abs: 1.3 10*3/uL (ref 0.7–4.0)
MCH: 28 pg (ref 26.0–34.0)
MCHC: 30.5 g/dL (ref 30.0–36.0)
MCV: 91.8 fL (ref 80.0–100.0)
Monocytes Absolute: 0.7 10*3/uL (ref 0.1–1.0)
Monocytes Relative: 6 %
Neutro Abs: 9.3 10*3/uL — ABNORMAL HIGH (ref 1.7–7.7)
Neutrophils Relative %: 80 %
Platelets: 195 10*3/uL (ref 150–400)
RBC: 3.89 MIL/uL (ref 3.87–5.11)
RDW: 13.9 % (ref 11.5–15.5)
WBC: 11.7 10*3/uL — ABNORMAL HIGH (ref 4.0–10.5)
nRBC: 0 % (ref 0.0–0.2)

## 2020-05-12 LAB — COMPREHENSIVE METABOLIC PANEL
ALT: 15 U/L (ref 0–44)
AST: 24 U/L (ref 15–41)
Albumin: 3.2 g/dL — ABNORMAL LOW (ref 3.5–5.0)
Alkaline Phosphatase: 64 U/L (ref 38–126)
Anion gap: 8 (ref 5–15)
BUN: 21 mg/dL (ref 8–23)
CO2: 25 mmol/L (ref 22–32)
Calcium: 9.1 mg/dL (ref 8.9–10.3)
Chloride: 102 mmol/L (ref 98–111)
Creatinine, Ser: 1.59 mg/dL — ABNORMAL HIGH (ref 0.44–1.00)
GFR calc Af Amer: 34 mL/min — ABNORMAL LOW (ref >60)
GFR calc non Af Amer: 29 mL/min — ABNORMAL LOW (ref >60)
Glucose, Bld: 126 mg/dL — ABNORMAL HIGH (ref 70–99)
Potassium: 4.3 mmol/L (ref 3.5–5.1)
Sodium: 135 mmol/L (ref 135–145)
Total Bilirubin: 0.7 mg/dL (ref 0.3–1.2)
Total Protein: 5.3 g/dL — ABNORMAL LOW (ref 6.5–8.1)

## 2020-05-12 LAB — LACTIC ACID, PLASMA
Lactic Acid, Venous: 0.9 mmol/L (ref 0.5–1.9)
Lactic Acid, Venous: 0.8 mmol/L (ref 0.5–1.9)

## 2020-05-12 LAB — CK: Total CK: 167 U/L (ref 38–234)

## 2020-05-12 LAB — SARS CORONAVIRUS 2 BY RT PCR (HOSPITAL ORDER, PERFORMED IN ~~LOC~~ HOSPITAL LAB): SARS Coronavirus 2: NEGATIVE

## 2020-05-12 MED ORDER — SODIUM CHLORIDE 0.9 % IV BOLUS
500.0000 mL | Freq: Once | INTRAVENOUS | Status: AC
  Administered 2020-05-12: 500 mL via INTRAVENOUS

## 2020-05-12 NOTE — ED Notes (Signed)
Pt to CT

## 2020-05-12 NOTE — ED Provider Notes (Signed)
[facility] EMERGENCY DEPARTMENT Provider Note   CSN: [id number] Arrival date & time: 05/12/20  [id number]     History Chief Complaint  Patient presents with  . Fall    Angelica Bell is a 86 y.o. female.  The history is provided by the patient and the EMS personnel. No language interpreter was used.  Fall   Angelica Bell is a 86 y.o. female who presents to the Emergency Department complaining of fall. Level V caveat due to confusion. She presents the emergency department by EMS following an unwitnessed fall from home. She is checked on by her grandson twice per day but lives alone. She states that she tripped and fell this morning. She complains of pain in her abdomen and bilateral hips. She is unable to state how long she was on the floor. On EMS arrival she was found to be breathing four times a minute with SPO2 of 38 and was treated with Narcan 0.5 mg with improvement in her mental status. She does state that she takes oxycodone and may have taken extra. She denies any attempt to harm herself.    Past Medical History:  Diagnosis Date  . Chronic back pain    with degenerative joint disease  . Chronic pain disorder    with narcotic management  . Dry eyes   . Frequent falls   . HOH (hard of hearing)   . Hypertension   . Multiple rib fractures 02/2020  . Osteoporosis   . PE (pulmonary embolism)    After hysterectomy in 1967  . Pneumonia   . Rhabdomyolysis     Patient Active Problem List   Diagnosis Date Noted  . Opioid overdose ([place]) 05/13/2020  . DNR (do not resuscitate) 03/09/2020  . Frequent falls 03/09/2020  . Multiple rib fractures 03/08/2020  . Common bile duct dilatation 03/08/2020  . Leukocytosis 03/08/2020  . Normocytic anemia 03/08/2020  . Hypomagnesemia 02/08/2020  . Fall 02/06/2020  . AKI (acute kidney injury) ([place]) 02/05/2020  . UTI (urinary tract infection) 09/01/2019  . Rhabdomyolysis 09/01/2019  . Shock ([place]) 08/31/2019  .  Acute cystitis 08/03/2019  . Confusion and disorientation 08/03/2019  . Cerebrovascular accident (CVA) ([place])   . ARF (acute renal failure) ([place]) 07/26/2019  . Hyponatremia 07/26/2019  . Abnormal liver function 07/26/2019  . Peripheral neuropathy   . Drug overdose   . Polypharmacy   . Altered mental status 10/25/2017  . Closed displaced intertrochanteric fracture of left femur ([place]) 02/16/2017  . Hip fracture, right ([place]) 12/25/2015  . Chronic pain 12/24/2015  . Fracture of femoral neck, right, closed ([place]) 12/24/2015  . Essential hypertension 12/24/2015  . Community acquired pneumonia 04/15/2013  . Hypokalemia 04/15/2013  . Labial cyst 02/08/2012  . POSTMENOPAUSAL SYNDROME 11/28/2009  . PERIPHERAL NEUROPATHY, LOWER EXTREMITY, RIGHT 02/22/2009  . INSOMNIA-SLEEP DISORDER-UNSPEC 10/12/2008  . Lumbago 08/02/2008  . 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 ANXIETY DEPRESSION 12/16/2007  . Hyperlipidemia 07/08/2007  . Osteoarthritis 07/08/2007  . Osteoporosis 07/08/2007  . SCOLIOSIS NEC 07/08/2007    Past Surgical History:  Procedure Laterality Date  . ABDOMINAL HYSTERECTOMY    . APPENDECTOMY    . BREAST ENHANCEMENT SURGERY    . CATARACT EXTRACTION    . FEMUR IM NAIL Left 02/17/2017   Procedure: INTRAMEDULLARY (IM) NAIL FEMORAL;  Surgeon: Nicholes Stairs, MD;  Location: [facility];  Service: Orthopedics;  Laterality: Left;  . HIP ARTHROPLASTY Right 12/24/2015   Procedure: ARTHROPLASTY BIPOLAR HIP (HEMIARTHROPLASTY);  Surgeon:  Netta Cedars, MD;  Location: [facility];  Service: Orthopedics;  Laterality: Right;  . TONSILLECTOMY       OB History   No obstetric history on file.     Family History  Problem Relation Age of Onset  . Heart Problems Mother     Social History   Tobacco Use  . Smoking status: Former Smoker    Years: 13.00    Quit date: 04/15/1976    Years since quitting: 44.1  . Smokeless tobacco: Never Used  Substance Use Topics  . Alcohol use: No  .  Drug use: No    Home Medications Prior to Admission medications   Medication Sig Start Date End Date Taking? Authorizing Provider  amLODipine (NORVASC) 10 MG tablet Take 1 tablet (10 mg total) by mouth daily. 09/08/19 03/08/20  Kayleen Memos, DO  aspirin EC 81 MG EC tablet Take 1 tablet (81 mg total) by mouth daily. 09/15/19   Matcha, Beverely Pace, MD  atorvastatin (LIPITOR) 10 MG tablet Take 1 tablet (10 mg total) by mouth daily at 6 PM. Patient not taking: Reported on 03/30/2020 08/07/19   Mariel Aloe, MD  escitalopram (LEXAPRO) 20 MG tablet Take 20 mg by mouth daily. 02/23/20   [provider]  feeding supplement, ENSURE ENLIVE, (ENSURE ENLIVE) LIQD Take 237 mLs by mouth 2 (two) times daily between meals. 03/10/20   Black, Lezlie Octave, NP  ferrous sulfate 325 (65 FE) MG EC tablet Take 325 mg by mouth daily. 01/12/20   [provider]  furosemide (LASIX) 20 MG tablet Take 20 mg by mouth daily. 01/10/20   [provider]  gabapentin (NEURONTIN) 100 MG capsule Take 1 capsule (100 mg total) by mouth 3 (three) times daily. Patient taking differently: Take 100 mg by mouth as needed (pain).  02/08/20   Harold Hedge, MD  methocarbamol (ROBAXIN) 500 MG tablet Take 1 tablet (500 mg total) by mouth every 8 (eight) hours as needed for muscle spasms. 03/10/20   Black, Lezlie Octave, NP  mirabegron ER (MYRBETRIQ) 25 MG TB24 tablet Take 25 mg by mouth daily.    [provider]  Multiple Vitamin (MULTIVITAMIN WITH MINERALS) TABS tablet Take 1 tablet by mouth daily. 03/11/20   Black, Lezlie Octave, NP  oxycodone (ROXICODONE) 30 MG immediate release tablet Take 30 mg by mouth 3 (three) times daily as needed for pain.  03/27/20   [provider]  oxyCODONE 10 MG TABS Take 1-1.5 tablets (10-15 mg total) by mouth every 4 (four) hours as needed for moderate pain or severe pain. 03/10/20   Black, Lezlie Octave, NP    Allergies    Hctz [hydrochlorothiazide], Amoxicillin-pot clavulanate,  Lisinopril-hydrochlorothiazide, Sulfa antibiotics, and Sulfonamide derivatives  Review of Systems   Review of Systems  All other systems reviewed and are negative.   Physical Exam Updated Vital Signs BP (!) 110/59   Pulse 88   Temp 100.1 F (37.8 C) (Oral)   Resp (!) 23   Ht 5' (1.524 m)   Wt 44 kg   SpO2 95%   BMI 18.94 kg/m   Physical Exam Vitals and nursing note reviewed.  Constitutional:      Appearance: She is well-developed.  HENT:     Head: Normocephalic and atraumatic.  Cardiovascular:     Rate and Rhythm: Normal rate and regular rhythm.     Heart sounds: No murmur.  Pulmonary:     Effort: Pulmonary effort is normal. No respiratory distress.  Breath sounds: Normal breath sounds.  Abdominal:     Palpations: Abdomen is soft.     Tenderness: There is no guarding or rebound.     Comments: Mild generalized abdominal tenderness  Musculoskeletal:     Comments: TTP over bilateral hips, right greater than left.  2+ DP pulses bilaterally  Skin:    General: Skin is warm and dry.  Neurological:     Mental Status: She is alert.     Comments: Mildly confused  Psychiatric:        Behavior: Behavior normal.     ED Results / Procedures / Treatments   Labs (all labs ordered are listed, but only abnormal results are displayed) Labs Reviewed  COMPREHENSIVE METABOLIC PANEL - Abnormal; Notable for the following components:      Result Value   Glucose, Bld 126 (*)    Creatinine, Ser 1.59 (*)    Total Protein 5.3 (*)    Albumin 3.2 (*)    GFR calc non Af Amer 29 (*)    GFR calc Af Amer 34 (*)    All other components within normal limits  CBC WITH DIFFERENTIAL/PLATELET - Abnormal; Notable for the following components:   WBC 11.7 (*)    Hemoglobin 10.9 (*)    HCT 35.7 (*)    Neutro Abs 9.3 (*)    All other components within normal limits  SARS CORONAVIRUS 2 BY RT PCR (HOSPITAL ORDER, [facility])  CULTURE, BLOOD (ROUTINE X 2)  CULTURE,  BLOOD (ROUTINE X 2)  ETHANOL  LACTIC ACID, PLASMA  LACTIC ACID, PLASMA  CK  RAPID URINE DRUG SCREEN, HOSP PERFORMED  URINALYSIS, ROUTINE W REFLEX MICROSCOPIC    EKG EKG Interpretation  Date/Time:  Friday May 12 2020 23:13:41 EDT Ventricular Rate:  71 PR Interval:    QRS Duration: 92 QT Interval:  380 QTC Calculation: 413 R Axis:   45 Text Interpretation: Sinus rhythm Low voltage, precordial leads Abnormal R-wave progression, early transition No significant change since last tracing Confirmed by Merrily Pew [phone]) on 05/12/2020 11:19:02 PM   Radiology CT Abdomen Pelvis Wo Contrast  Result Date: 05/12/2020 CLINICAL DATA:  Recent trip and fall with abdominal pain, initial encounter EXAM: CT ABDOMEN AND PELVIS WITHOUT CONTRAST TECHNIQUE: Multidetector CT imaging of the abdomen and pelvis was performed following the standard protocol without IV contrast. COMPARISON:  03/08/2020 FINDINGS: Lower chest: Lung bases again demonstrate some mild scarring stable from the prior exam. Hiatal hernia is again Hepatobiliary: No focal liver abnormality is seen. No gallstones, gallbladder wall thickening, or biliary dilatation. Pancreas: Unremarkable. No pancreatic ductal dilatation or surrounding inflammatory changes. Spleen: Normal in size without focal abnormality. Adrenals/Urinary Tract: Adrenal glands are within normal limits. Kidneys demonstrate no renal calculi or obstructive changes. Bladder is well distended. Stomach/Bowel: Diverticular change of the colon is noted without evidence of diverticulitis. No obstructive or inflammatory changes are noted. The appendix is not well visualized consistent with prior surgical excision. Small bowel is unremarkable. No gastric abnormality is noted with the exception of the previously mentioned hiatal hernia. Vascular/Lymphatic: Aortic calcifications are noted. No significant lymphadenopathy is noted. Left-sided IVC is noted and stable. Reproductive: Status post  hysterectomy. No adnexal masses. Other: No abdominal wall hernia or abnormality. No abdominopelvic ascites. Musculoskeletal: Right hip replacement is seen. Surgical fixation of the proximal left hip is noted. Degenerative changes of the lumbar spine are seen. Scoliosis concave to the right is again noted. No acute abnormality is noted. IMPRESSION:  Chronic changes as described above without acute abnormality. Electronically Signed   By: Inez Catalina M.D.   On: 05/12/2020 22:57   DG Chest 1 View  Result Date: 05/12/2020 CLINICAL DATA:  Pain after fall. EXAM: CHEST  1 VIEW COMPARISON:  Frontal and lateral views 03/30/2020. Chest CT 03/08/2020 FINDINGS: Normal heart size. Unchanged mediastinal contours with aortic atherosclerosis. No focal airspace disease. No pneumothorax or pleural effusion. Healing right rib fractures again seen. Remote left proximal humerus fracture. No acute osseous abnormalities are seen on this single frontal view. Scoliotic curvature of the spine. IMPRESSION: No acute finding. Electronically Signed   By: Keith Rake M.D.   On: 05/12/2020 20:31   CT Head Wo Contrast  Result Date: 05/12/2020 CLINICAL DATA:  Recent fall EXAM: CT HEAD WITHOUT CONTRAST CT CERVICAL SPINE WITHOUT CONTRAST TECHNIQUE: Multidetector CT imaging of the head and cervical spine was performed following the standard protocol without intravenous contrast. Multiplanar CT image reconstructions of the cervical spine were also generated. COMPARISON:  03/08/2020 FINDINGS: CT HEAD FINDINGS Brain: Chronic atrophic and ischemic changes are noted. No findings to suggest acute hemorrhage, acute infarction or space-occupying mass lesion are seen. Vascular: No hyperdense vessel or unexpected calcification. Skull: Normal. Negative for fracture or focal lesion. Sinuses/Orbits: No acute finding. Other: None. CT CERVICAL SPINE FINDINGS Alignment: Within normal limits. Skull base and vertebrae: 7 cervical segments are well  visualized. Vertebral body height is well maintained. Mild disc space narrowing is noted from C4-C7 with associated osteophytic changes. Mild facet hypertrophic changes are seen. The odontoid is within normal limits. No findings to suggest acute fracture or acute facet abnormality are noted. Soft tissues and spinal canal: Surrounding soft tissue structures show significant vascular calcifications. The thyroid demonstrates multiple hypodense nodules. These are stable in appearance from a prior CT from 2018 and felt to be benign in etiology. Upper chest: Visualized lung apices are within normal limits. Other: None IMPRESSION: CT of the head: Chronic atrophic and ischemic changes without acute abnormality. CT of the cervical spine: Multilevel degenerative change without acute abnormality. Multiple hypodense nodules throughout the thyroid gland stable from 2018. No followup recommended (ref: J Am Coll Radiol. 2015 Feb;12(2): 143-50). Electronically Signed   By: Inez Catalina M.D.   On: 05/12/2020 20:23   CT Cervical Spine Wo Contrast  Result Date: 05/12/2020 CLINICAL DATA:  Recent fall EXAM: CT HEAD WITHOUT CONTRAST CT CERVICAL SPINE WITHOUT CONTRAST TECHNIQUE: Multidetector CT imaging of the head and cervical spine was performed following the standard protocol without intravenous contrast. Multiplanar CT image reconstructions of the cervical spine were also generated. COMPARISON:  03/08/2020 FINDINGS: CT HEAD FINDINGS Brain: Chronic atrophic and ischemic changes are noted. No findings to suggest acute hemorrhage, acute infarction or space-occupying mass lesion are seen. Vascular: No hyperdense vessel or unexpected calcification. Skull: Normal. Negative for fracture or focal lesion. Sinuses/Orbits: No acute finding. Other: None. CT CERVICAL SPINE FINDINGS Alignment: Within normal limits. Skull base and vertebrae: 7 cervical segments are well visualized. Vertebral body height is well maintained. Mild disc space  narrowing is noted from C4-C7 with associated osteophytic changes. Mild facet hypertrophic changes are seen. The odontoid is within normal limits. No findings to suggest acute fracture or acute facet abnormality are noted. Soft tissues and spinal canal: Surrounding soft tissue structures show significant vascular calcifications. The thyroid demonstrates multiple hypodense nodules. These are stable in appearance from a prior CT from 2018 and felt to be benign in etiology. Upper chest: Visualized lung apices are  within normal limits. Other: None IMPRESSION: CT of the head: Chronic atrophic and ischemic changes without acute abnormality. CT of the cervical spine: Multilevel degenerative change without acute abnormality. Multiple hypodense nodules throughout the thyroid gland stable from 2018. No followup recommended (ref: J Am Coll Radiol. 2015 Feb;12(2): 143-50). Electronically Signed   By: Inez Catalina M.D.   On: 05/12/2020 20:23   DG Hips Bilat W or Wo Pelvis 3-4 Views  Result Date: 05/12/2020 CLINICAL DATA:  Pain after fall. EXAM: DG HIP (WITH OR WITHOUT PELVIS) 3-4V BILAT COMPARISON:  Hip radiograph 02/05/2020, right hip CT same day. FINDINGS: Right hip arthroplasty in expected alignment. No periprosthetic lucency or fracture. Intramedullary rod with locking and trans trochanteric screw in the left proximal femur. No periprosthetic lucency or fracture. No evidence of pelvic ring or pubic rami fracture. Degenerative change of the pubic symphysis and sacroiliac joints. Scoliotic curvature of the included lower lumbar spine. IMPRESSION: 1. No acute fracture of the pelvis or hips. 2. Intact right hip arthroplasty and surgical hardware in the left proximal femur. No hardware complication or periprosthetic fracture. Electronically Signed   By: Keith Rake M.D.   On: 05/12/2020 20:35    Procedures Procedures (including critical care time)  Medications Ordered in ED Medications  sodium chloride 0.9 %  bolus 500 mL (500 mLs Intravenous New Bag/Given 05/12/20 2324)    ED Course  I have reviewed the triage vital signs and the nursing notes.  Pertinent labs & imaging results that were available during my care of the patient were reviewed by me and considered in my medical decision making (see chart for details).    MDM Rules/Calculators/A&P                     Patient here for evaluation of injuries following a fall. She did have hypoxia for EMS. She was treated with Narcan prior to ED arrival with improvement in her respiratory rate and her have hypoxia but they hypoxia did not fully resolve. She requires 2 L nasal cannula to sustain oxygen saturations to the mid 90s. She complains of abdominal pain and bilateral hip pain.  Imaging negative for acute fracture or intraabdominal pathology.  She does have a low grade temperature in the ED, mild leukocytosis but no clear source of infection.  Labs with mild AKI - will treat with IVF hydration.  D/w pt recommendation for admission. Hospitalist consulted for admission for AKI, fall with new hypoxia.   Final Clinical Impression(s) / ED Diagnoses Final diagnoses:  Fall, initial encounter  Hypoxia    Rx / DC Orders ED Discharge Orders    None       Quintella Reichert, MD 05/13/20 [phone]

## 2020-05-12 NOTE — ED Triage Notes (Signed)
PT here via [facility] from home for fall.  Her grandson checks on her 2 x's per day.  Pt tripped and had a mechanical fall when he was there this am.  When he came home this evening she was experiencing R sided pain.  When EMS arrived pt was breathing 4 x per minute with an spo2 of 38.  Given 0.5 mg narcan and pt responded 10 seconds later.  NRB was removed and pt sats increased to 100% after narcan.  Pt denies narcotic use.

## 2020-05-13 DIAGNOSIS — R54 Age-related physical debility: Secondary | ICD-10-CM | POA: Diagnosis present

## 2020-05-13 DIAGNOSIS — N179 Acute kidney failure, unspecified: Secondary | ICD-10-CM | POA: Diagnosis present

## 2020-05-13 DIAGNOSIS — H9190 Unspecified hearing loss, unspecified ear: Secondary | ICD-10-CM | POA: Diagnosis present

## 2020-05-13 DIAGNOSIS — Z79891 Long term (current) use of opiate analgesic: Secondary | ICD-10-CM | POA: Diagnosis not present

## 2020-05-13 DIAGNOSIS — N3281 Overactive bladder: Secondary | ICD-10-CM | POA: Diagnosis present

## 2020-05-13 DIAGNOSIS — F341 Dysthymic disorder: Secondary | ICD-10-CM | POA: Diagnosis not present

## 2020-05-13 DIAGNOSIS — D649 Anemia, unspecified: Secondary | ICD-10-CM | POA: Diagnosis present

## 2020-05-13 DIAGNOSIS — Z993 Dependence on wheelchair: Secondary | ICD-10-CM | POA: Diagnosis not present

## 2020-05-13 DIAGNOSIS — Z96641 Presence of right artificial hip joint: Secondary | ICD-10-CM | POA: Diagnosis present

## 2020-05-13 DIAGNOSIS — I10 Essential (primary) hypertension: Secondary | ICD-10-CM | POA: Diagnosis present

## 2020-05-13 DIAGNOSIS — Y92009 Unspecified place in unspecified non-institutional (private) residence as the place of occurrence of the external cause: Secondary | ICD-10-CM | POA: Diagnosis not present

## 2020-05-13 DIAGNOSIS — Z8673 Personal history of transient ischemic attack (TIA), and cerebral infarction without residual deficits: Secondary | ICD-10-CM | POA: Diagnosis not present

## 2020-05-13 DIAGNOSIS — R7989 Other specified abnormal findings of blood chemistry: Secondary | ICD-10-CM | POA: Diagnosis present

## 2020-05-13 DIAGNOSIS — R197 Diarrhea, unspecified: Secondary | ICD-10-CM | POA: Diagnosis present

## 2020-05-13 DIAGNOSIS — M810 Age-related osteoporosis without current pathological fracture: Secondary | ICD-10-CM | POA: Diagnosis present

## 2020-05-13 DIAGNOSIS — Z86711 Personal history of pulmonary embolism: Secondary | ICD-10-CM | POA: Diagnosis not present

## 2020-05-13 DIAGNOSIS — Z20822 Contact with and (suspected) exposure to covid-19: Secondary | ICD-10-CM | POA: Diagnosis present

## 2020-05-13 DIAGNOSIS — R0902 Hypoxemia: Secondary | ICD-10-CM | POA: Diagnosis present

## 2020-05-13 DIAGNOSIS — T402X1A Poisoning by other opioids, accidental (unintentional), initial encounter: Secondary | ICD-10-CM | POA: Diagnosis present

## 2020-05-13 DIAGNOSIS — J9601 Acute respiratory failure with hypoxia: Secondary | ICD-10-CM | POA: Diagnosis present

## 2020-05-13 DIAGNOSIS — E876 Hypokalemia: Secondary | ICD-10-CM | POA: Diagnosis present

## 2020-05-13 DIAGNOSIS — Z90710 Acquired absence of both cervix and uterus: Secondary | ICD-10-CM | POA: Diagnosis not present

## 2020-05-13 DIAGNOSIS — M549 Dorsalgia, unspecified: Secondary | ICD-10-CM | POA: Diagnosis present

## 2020-05-13 DIAGNOSIS — W010XXA Fall on same level from slipping, tripping and stumbling without subsequent striking against object, initial encounter: Secondary | ICD-10-CM | POA: Diagnosis present

## 2020-05-13 DIAGNOSIS — G8929 Other chronic pain: Secondary | ICD-10-CM

## 2020-05-13 DIAGNOSIS — G894 Chronic pain syndrome: Secondary | ICD-10-CM | POA: Diagnosis present

## 2020-05-13 DIAGNOSIS — F329 Major depressive disorder, single episode, unspecified: Secondary | ICD-10-CM | POA: Diagnosis present

## 2020-05-13 DIAGNOSIS — F419 Anxiety disorder, unspecified: Secondary | ICD-10-CM | POA: Diagnosis present

## 2020-05-13 LAB — RAPID URINE DRUG SCREEN, HOSP PERFORMED
Amphetamines: NOT DETECTED
Barbiturates: NOT DETECTED
Benzodiazepines: NOT DETECTED
Cocaine: NOT DETECTED
Opiates: POSITIVE — AB
Tetrahydrocannabinol: NOT DETECTED

## 2020-05-13 LAB — URINALYSIS, ROUTINE W REFLEX MICROSCOPIC
Bilirubin Urine: NEGATIVE
Glucose, UA: NEGATIVE mg/dL
Hgb urine dipstick: NEGATIVE
Ketones, ur: NEGATIVE mg/dL
Leukocytes,Ua: NEGATIVE
Nitrite: NEGATIVE
Protein, ur: NEGATIVE mg/dL
Specific Gravity, Urine: 1.015 (ref 1.005–1.030)
pH: 5 (ref 5.0–8.0)

## 2020-05-13 LAB — IRON AND TIBC
Iron: 69 ug/dL (ref 28–170)
Saturation Ratios: 24 % (ref 10.4–31.8)
TIBC: 288 ug/dL (ref 250–450)
UIBC: 219 ug/dL

## 2020-05-13 LAB — FERRITIN: Ferritin: 43 ng/mL (ref 11–307)

## 2020-05-13 MED ORDER — HEPARIN SODIUM (PORCINE) 5000 UNIT/ML IJ SOLN
5000.0000 [IU] | Freq: Three times a day (TID) | INTRAMUSCULAR | Status: DC
  Administered 2020-05-13 – 2020-05-22 (×28): 5000 [IU] via SUBCUTANEOUS
  Filled 2020-05-13 (×28): qty 1

## 2020-05-13 MED ORDER — ASPIRIN EC 81 MG PO TBEC
81.0000 mg | DELAYED_RELEASE_TABLET | Freq: Every day | ORAL | Status: DC
  Administered 2020-05-13 – 2020-05-22 (×10): 81 mg via ORAL
  Filled 2020-05-13 (×10): qty 1

## 2020-05-13 MED ORDER — SODIUM CHLORIDE 0.9 % IV SOLN: INTRAVENOUS | Status: DC

## 2020-05-13 MED ORDER — ESCITALOPRAM OXALATE 10 MG PO TABS
20.0000 mg | ORAL_TABLET | Freq: Every day | ORAL | Status: DC
  Administered 2020-05-13 – 2020-05-22 (×10): 20 mg via ORAL
  Filled 2020-05-13 (×10): qty 2

## 2020-05-13 MED ORDER — GABAPENTIN 100 MG PO CAPS
100.0000 mg | ORAL_CAPSULE | ORAL | Status: DC | PRN
  Administered 2020-05-13 – 2020-05-21 (×11): 100 mg via ORAL
  Filled 2020-05-13 (×11): qty 1

## 2020-05-13 MED ORDER — OXYCODONE HCL 5 MG PO TABS
5.0000 mg | ORAL_TABLET | Freq: Three times a day (TID) | ORAL | Status: DC | PRN
  Administered 2020-05-14 – 2020-05-21 (×18): 5 mg via ORAL
  Filled 2020-05-13 (×18): qty 1

## 2020-05-13 MED ORDER — MIRABEGRON ER 25 MG PO TB24
25.0000 mg | ORAL_TABLET | Freq: Every day | ORAL | Status: DC
  Administered 2020-05-13 – 2020-05-22 (×10): 25 mg via ORAL
  Filled 2020-05-13 (×10): qty 1

## 2020-05-13 MED ORDER — METHOCARBAMOL 500 MG PO TABS
500.0000 mg | ORAL_TABLET | Freq: Three times a day (TID) | ORAL | Status: DC | PRN
  Administered 2020-05-13: 500 mg via ORAL
  Filled 2020-05-13: qty 1

## 2020-05-13 NOTE — H&P (Signed)
History and Physical    Angelica Bell [id number] DOB: 1932/05/10 DOA: 05/12/2020  PCP: Heywood Bene, PA-C  Patient coming from: home   Chief Complaint: fall  HPI: Angelica Bell is a 86 y.o. female with medical history significant for chronic pain on daily opioids, osteoporosis, cva, htn, who presents with the above.  Pt reports several months slowly worsening debility, gets around mostly in a wheelchair. She reports that either earlier today or yesterday while transferring from wheelchair to bed she fell. Denies hitting head. Says was able to get self back up. She is a bit confused with her story. Her grandson, Angelica Bell, who she lists as her contact, is not reachable by telephone. Per EDP, she was found on the floor or her home, unclear how long there. EMS alerged, presumably by her grandson who checks on her daily (she lives alone). Reportedly found to be breathing 4 times a minute with spo2 of 38(?). Was given narcan with improvement in mental status. Hypoxic to mid 80s on arrival to ED.  Currently complaining of low back pain which she says is chronic. She thinks she may have taken too much oxycodone. She is unable to tell me exactly how much she normally takes. Denies other drug use.   Denies chest pain. Does report a few days of watery stools. Denies abd pain or nausea/vomiting. Denies blood or mucous in stool. Denies recent antibiotics. Denies dysuria or hematuria.   ED Course: labs, imaging  Review of Systems: As per HPI otherwise 10 point review of systems negative.    Past Medical History:  Diagnosis Date  . Chronic back pain    with degenerative joint disease  . Chronic pain disorder    with narcotic management  . Dry eyes   . Frequent falls   . HOH (hard of hearing)   . Hypertension   . Multiple rib fractures 02/2020  . Osteoporosis   . PE (pulmonary embolism)    After hysterectomy in 1967  . Pneumonia   . Rhabdomyolysis     Past Surgical  History:  Procedure Laterality Date  . ABDOMINAL HYSTERECTOMY    . APPENDECTOMY    . BREAST ENHANCEMENT SURGERY    . CATARACT EXTRACTION    . FEMUR IM NAIL Left 02/17/2017   Procedure: INTRAMEDULLARY (IM) NAIL FEMORAL;  Surgeon: Nicholes Stairs, MD;  Location: [facility];  Service: Orthopedics;  Laterality: Left;  . HIP ARTHROPLASTY Right 12/24/2015   Procedure: ARTHROPLASTY BIPOLAR HIP (HEMIARTHROPLASTY);  Surgeon: Netta Cedars, MD;  Location: [facility];  Service: Orthopedics;  Laterality: Right;  . TONSILLECTOMY       reports that she quit smoking about 44 years ago. She quit after 13.00 years of use. She has never used smokeless tobacco. She reports that she does not drink alcohol or use drugs.  Allergies  Allergen Reactions  . Hctz [Hydrochlorothiazide]     Significant and rapid hyponatremia (TIH)  . Amoxicillin-Pot Clavulanate Hives and Diarrhea    Has patient had a PCN reaction causing immediate rash, facial/tongue/throat swelling, SOB or lightheadedness with hypotension: yes Has patient had a PCN reaction causing severe rash involving mucus membranes or skin necrosis: yes Has patient had a PCN reaction that required hospitalization : unknown Has patient had a PCN reaction occurring within the last 10 years: unknown If all of the above answers are "NO", then may proceed with Cephalosporin use.   Angelica Bell Lisinopril-Hydrochlorothiazide Other (See Comments)    Tired- no energy to do anything.Angelica Bell  Angelica Bell  Sulfa Antibiotics Hives and Nausea And Vomiting  . Sulfonamide Derivatives Hives    Family History  Problem Relation Age of Onset  . Heart Problems Mother     Prior to Admission medications   Medication Sig Start Date End Date Taking? Authorizing Provider  amLODipine (NORVASC) 10 MG tablet Take 1 tablet (10 mg total) by mouth daily. 09/08/19 03/08/20  Kayleen Memos, DO  aspirin EC 81 MG EC tablet Take 1 tablet (81 mg total) by mouth daily. 09/15/19   Matcha, Beverely Pace, MD  atorvastatin (LIPITOR)  10 MG tablet Take 1 tablet (10 mg total) by mouth daily at 6 PM. Patient not taking: Reported on 03/30/2020 08/07/19   Mariel Aloe, MD  escitalopram (LEXAPRO) 20 MG tablet Take 20 mg by mouth daily. 02/23/20   [provider]  feeding supplement, ENSURE ENLIVE, (ENSURE ENLIVE) LIQD Take 237 mLs by mouth 2 (two) times daily between meals. 03/10/20   Black, Lezlie Octave, NP  ferrous sulfate 325 (65 FE) MG EC tablet Take 325 mg by mouth daily. 01/12/20   [provider]  furosemide (LASIX) 20 MG tablet Take 20 mg by mouth daily. 01/10/20   [provider]  gabapentin (NEURONTIN) 100 MG capsule Take 1 capsule (100 mg total) by mouth 3 (three) times daily. Patient taking differently: Take 100 mg by mouth as needed (pain).  02/08/20   Harold Hedge, MD  methocarbamol (ROBAXIN) 500 MG tablet Take 1 tablet (500 mg total) by mouth every 8 (eight) hours as needed for muscle spasms. 03/10/20   Black, Lezlie Octave, NP  mirabegron ER (MYRBETRIQ) 25 MG TB24 tablet Take 25 mg by mouth daily.    [provider]  Multiple Vitamin (MULTIVITAMIN WITH MINERALS) TABS tablet Take 1 tablet by mouth daily. 03/11/20   Black, Lezlie Octave, NP  oxycodone (ROXICODONE) 30 MG immediate release tablet Take 30 mg by mouth 3 (three) times daily as needed for pain.  03/27/20   [provider]  oxyCODONE 10 MG TABS Take 1-1.5 tablets (10-15 mg total) by mouth every 4 (four) hours as needed for moderate pain or severe pain. 03/10/20   Radene Gunning, NP    Physical Exam: Vitals:   05/12/20 1853 05/12/20 2005 05/12/20 2115 05/12/20 2215  BP:   (!) 104/57 (!) 110/59  Pulse:      Resp:   16 (!) 23  Temp:      TempSrc:      SpO2: 97%  100% 95%  Weight:  44 kg    Height:  5' (1.524 m)      Constitutional: No acute distress, ill appearing Head: Atraumatic Eyes: Conjunctiva clear ENM: Moist mucous membranes. poordentition.  Neck: Supple Respiratory: Clear to auscultation bilaterally, no  wheezing/rales/rhonchi. Normal respiratory effort. No accessory muscle use. . Cardiovascular: Regular rate and rhythm. Mod systolic murmur Abdomen: Non-tender, non-distended. No masses. No rebound or guarding. Positive bowel sounds. Musculoskeletal: No joint deformity upper and lower extremities. Normal ROM, no contractures. Decreased muscle tone.  Skin: No rashes, lesions, or ulcers. Normal sacrum Extremities: No peripheral edema. Palpable peripheral pulses. Neurologic: Alert, moving all 4 extremities. Psychiatric: slightly confused   Labs on Admission: I have personally reviewed following labs and imaging studies  CBC: Recent Labs  Lab 05/12/20 2103  WBC 11.7*  NEUTROABS 9.3*  HGB 10.9*  HCT 35.7*  MCV 91.8  PLT 0000000   Basic Metabolic Panel: Recent Labs  Lab 05/12/20 2103  NA 135  K 4.3  CL 102  CO2 25  GLUCOSE 126*  BUN 21  CREATININE 1.59*  CALCIUM 9.1   GFR: Estimated Creatinine Clearance: 17.6 mL/min (A) (by C-G formula based on SCr of 1.59 mg/dL (H)). Liver Function Tests: Recent Labs  Lab 05/12/20 2103  AST 24  ALT 15  ALKPHOS 64  BILITOT 0.7  PROT 5.3*  ALBUMIN 3.2*   No results for input(s): LIPASE, AMYLASE in the last 168 hours. No results for input(s): AMMONIA in the last 168 hours. Coagulation Profile: No results for input(s): INR, PROTIME in the last 168 hours. Cardiac Enzymes: Recent Labs  Lab 05/12/20 2100  CKTOTAL 167   BNP (last 3 results) No results for input(s): PROBNP in the last 8760 hours. HbA1C: No results for input(s): HGBA1C in the last 72 hours. CBG: No results for input(s): GLUCAP in the last 168 hours. Lipid Profile: No results for input(s): CHOL, HDL, LDLCALC, TRIG, CHOLHDL, LDLDIRECT in the last 72 hours. Thyroid Function Tests: No results for input(s): TSH, T4TOTAL, FREET4, T3FREE, THYROIDAB in the last 72 hours. Anemia Panel: No results for input(s): VITAMINB12, FOLATE, FERRITIN, TIBC, IRON, RETICCTPCT in the last  72 hours. Urine analysis:    Component Value Date/Time   COLORURINE YELLOW 03/09/2020 0446   APPEARANCEUR HAZY (A) 03/09/2020 0446   LABSPEC 1.040 (H) 03/09/2020 0446   PHURINE 5.0 03/09/2020 0446   GLUCOSEU NEGATIVE 03/09/2020 0446   HGBUR NEGATIVE 03/09/2020 0446   HGBUR negative 04/24/2009 1635   BILIRUBINUR NEGATIVE 03/09/2020 0446   BILIRUBINUR negative 05/29/2011 1439   KETONESUR NEGATIVE 03/09/2020 0446   PROTEINUR NEGATIVE 03/09/2020 0446   UROBILINOGEN 0.2 01/26/2014 1912   NITRITE NEGATIVE 03/09/2020 0446   LEUKOCYTESUR LARGE (A) 03/09/2020 0446    Radiological Exams on Admission: CT Abdomen Pelvis Wo Contrast  Result Date: 05/12/2020 CLINICAL DATA:  Recent trip and fall with abdominal pain, initial encounter EXAM: CT ABDOMEN AND PELVIS WITHOUT CONTRAST TECHNIQUE: Multidetector CT imaging of the abdomen and pelvis was performed following the standard protocol without IV contrast. COMPARISON:  03/08/2020 FINDINGS: Lower chest: Lung bases again demonstrate some mild scarring stable from the prior exam. Hiatal hernia is again Hepatobiliary: No focal liver abnormality is seen. No gallstones, gallbladder wall thickening, or biliary dilatation. Pancreas: Unremarkable. No pancreatic ductal dilatation or surrounding inflammatory changes. Spleen: Normal in size without focal abnormality. Adrenals/Urinary Tract: Adrenal glands are within normal limits. Kidneys demonstrate no renal calculi or obstructive changes. Bladder is well distended. Stomach/Bowel: Diverticular change of the colon is noted without evidence of diverticulitis. No obstructive or inflammatory changes are noted. The appendix is not well visualized consistent with prior surgical excision. Small bowel is unremarkable. No gastric abnormality is noted with the exception of the previously mentioned hiatal hernia. Vascular/Lymphatic: Aortic calcifications are noted. No significant lymphadenopathy is noted. Left-sided IVC is noted  and stable. Reproductive: Status post hysterectomy. No adnexal masses. Other: No abdominal wall hernia or abnormality. No abdominopelvic ascites. Musculoskeletal: Right hip replacement is seen. Surgical fixation of the proximal left hip is noted. Degenerative changes of the lumbar spine are seen. Scoliosis concave to the right is again noted. No acute abnormality is noted. IMPRESSION: Chronic changes as described above without acute abnormality. Electronically Signed   By: Inez Catalina M.D.   On: 05/12/2020 22:57   DG Chest 1 View  Result Date: 05/12/2020 CLINICAL DATA:  Pain after fall. EXAM: CHEST  1 VIEW COMPARISON:  Frontal and lateral views 03/30/2020. Chest CT 03/08/2020 FINDINGS: Normal heart size. Unchanged mediastinal  contours with aortic atherosclerosis. No focal airspace disease. No pneumothorax or pleural effusion. Healing right rib fractures again seen. Remote left proximal humerus fracture. No acute osseous abnormalities are seen on this single frontal view. Scoliotic curvature of the spine. IMPRESSION: No acute finding. Electronically Signed   By: Keith Rake M.D.   On: 05/12/2020 20:31   CT Head Wo Contrast  Result Date: 05/12/2020 CLINICAL DATA:  Recent fall EXAM: CT HEAD WITHOUT CONTRAST CT CERVICAL SPINE WITHOUT CONTRAST TECHNIQUE: Multidetector CT imaging of the head and cervical spine was performed following the standard protocol without intravenous contrast. Multiplanar CT image reconstructions of the cervical spine were also generated. COMPARISON:  03/08/2020 FINDINGS: CT HEAD FINDINGS Brain: Chronic atrophic and ischemic changes are noted. No findings to suggest acute hemorrhage, acute infarction or space-occupying mass lesion are seen. Vascular: No hyperdense vessel or unexpected calcification. Skull: Normal. Negative for fracture or focal lesion. Sinuses/Orbits: No acute finding. Other: None. CT CERVICAL SPINE FINDINGS Alignment: Within normal limits. Skull base and vertebrae:  7 cervical segments are well visualized. Vertebral body height is well maintained. Mild disc space narrowing is noted from C4-C7 with associated osteophytic changes. Mild facet hypertrophic changes are seen. The odontoid is within normal limits. No findings to suggest acute fracture or acute facet abnormality are noted. Soft tissues and spinal canal: Surrounding soft tissue structures show significant vascular calcifications. The thyroid demonstrates multiple hypodense nodules. These are stable in appearance from a prior CT from 2018 and felt to be benign in etiology. Upper chest: Visualized lung apices are within normal limits. Other: None IMPRESSION: CT of the head: Chronic atrophic and ischemic changes without acute abnormality. CT of the cervical spine: Multilevel degenerative change without acute abnormality. Multiple hypodense nodules throughout the thyroid gland stable from 2018. No followup recommended (ref: J Am Coll Radiol. 2015 Feb;12(2): 143-50). Electronically Signed   By: Inez Catalina M.D.   On: 05/12/2020 20:23   CT Cervical Spine Wo Contrast  Result Date: 05/12/2020 CLINICAL DATA:  Recent fall EXAM: CT HEAD WITHOUT CONTRAST CT CERVICAL SPINE WITHOUT CONTRAST TECHNIQUE: Multidetector CT imaging of the head and cervical spine was performed following the standard protocol without intravenous contrast. Multiplanar CT image reconstructions of the cervical spine were also generated. COMPARISON:  03/08/2020 FINDINGS: CT HEAD FINDINGS Brain: Chronic atrophic and ischemic changes are noted. No findings to suggest acute hemorrhage, acute infarction or space-occupying mass lesion are seen. Vascular: No hyperdense vessel or unexpected calcification. Skull: Normal. Negative for fracture or focal lesion. Sinuses/Orbits: No acute finding. Other: None. CT CERVICAL SPINE FINDINGS Alignment: Within normal limits. Skull base and vertebrae: 7 cervical segments are well visualized. Vertebral body height is well  maintained. Mild disc space narrowing is noted from C4-C7 with associated osteophytic changes. Mild facet hypertrophic changes are seen. The odontoid is within normal limits. No findings to suggest acute fracture or acute facet abnormality are noted. Soft tissues and spinal canal: Surrounding soft tissue structures show significant vascular calcifications. The thyroid demonstrates multiple hypodense nodules. These are stable in appearance from a prior CT from 2018 and felt to be benign in etiology. Upper chest: Visualized lung apices are within normal limits. Other: None IMPRESSION: CT of the head: Chronic atrophic and ischemic changes without acute abnormality. CT of the cervical spine: Multilevel degenerative change without acute abnormality. Multiple hypodense nodules throughout the thyroid gland stable from 2018. No followup recommended (ref: J Am Coll Radiol. 2015 Feb;12(2): 143-50). Electronically Signed   By: Linus Mako.D.  On: 05/12/2020 20:23   DG Hips Bilat W or Wo Pelvis 3-4 Views  Result Date: 05/12/2020 CLINICAL DATA:  Pain after fall. EXAM: DG HIP (WITH OR WITHOUT PELVIS) 3-4V BILAT COMPARISON:  Hip radiograph 02/05/2020, right hip CT same day. FINDINGS: Right hip arthroplasty in expected alignment. No periprosthetic lucency or fracture. Intramedullary rod with locking and trans trochanteric screw in the left proximal femur. No periprosthetic lucency or fracture. No evidence of pelvic ring or pubic rami fracture. Degenerative change of the pubic symphysis and sacroiliac joints. Scoliotic curvature of the included lower lumbar spine. IMPRESSION: 1. No acute fracture of the pelvis or hips. 2. Intact right hip arthroplasty and surgical hardware in the left proximal femur. No hardware complication or periprosthetic fracture. Electronically Signed   By: Keith Rake M.D.   On: 05/12/2020 20:35    EKG: Independently reviewed. nsr  Assessment/Plan Active Problems:   ANXIETY DEPRESSION    Chronic pain   AKI (acute kidney injury) ([place])   Opioid overdose ([place])   # Opioid overdose - hx chronic pain on daily opioids. Found down at home hypoxic with decreased respiratory rate, apparently rapid recovery after narcan. Mild hypoxia in ED corrected with [place] O2. CK not elevated but does have mild aki. Ct head and c spine neg for acute process as is cxr and hip x ray. Non-con ct of abdomen and pelvis also not acute. Frail at baseline, uses wheelchair. covid neg. - hold home oxycodone for now, will need to determine baseline oxycodone consumption and then likely plan for resuming at decreased dose - cont [place] O2, wean as able - f/u utox, etoh neg  # Debility  - pt/ot consults  # Acute kidney injury  # Diarrhea - likely pre-renal, unclear how long down. Also reports 3 days watery stools. No fever, blood, recent abx. S/p 500 cc bolus in ed. Non con ct abd/pelvis w/o signs acute pathology. - po ad lib, NS @ 100 - am bmp - monitor diarrhea, further w/u if worsens or danger signs present.  # normocytic anemia - h 10.9, baseline - monitor  # depression - cont home escitalopram  # overactive bladder - cont home myrbetriq  # Chronic pain - holding oxycodone as above - cont home gabapentin  # htn - here bp low normal in setting of likely opioid overdose, also diarrhea - hold home amlodipine; cont home atorva and asa - hold home lasix given aki presumably pre-renal  DVT prophylaxis: heparin Code Status: full  Family Communication: grandson Angelica Bell is emergency contact, unable to reach by telephone  Disposition Plan: tbd  Consults called: none  Admission status: med/surg    Desma Maxim MD [facility] Pager [phone]  If 7PM-7AM, please contact night-coverage [email or web address] Password [email or web address]  05/13/2020, 12:20 AM

## 2020-05-13 NOTE — ED Notes (Signed)
Pt eating a sandwich and drinking water per the doctor.

## 2020-05-13 NOTE — Care Management (Addendum)
Utilized PING for this patient, she is followed by [facility], called the number and left a HIPPA compliant message to call me back  [phone]. For admissions. MS Padget currently lives alone, and is checked on daily by her grandson. She is on chronic opiods for chronic pain in lower pain and arthritic knees She is wheelchair bound at home.She was a [facility] in February of this year, as well she had [facility] for home health when she returned home. She came to the ED today for what appears to be a opiod overdose. She received   Recommend PT OT consult to evaluate for further needs at home as wells as supervisory needs/SNF

## 2020-05-13 NOTE — Plan of Care (Signed)
  Problem: Coping: Goal: Level of anxiety will decrease Outcome: Progressing   Problem: Safety: Goal: Ability to remain free from injury will improve Outcome: Progressing   Problem: Skin Integrity: Goal: Risk for impaired skin integrity will decrease Outcome: Progressing   

## 2020-05-13 NOTE — Progress Notes (Addendum)
Pt arrived to the unit. Pt not in distress and tolerated well. Vital signs taken. 

## 2020-05-13 NOTE — ED Notes (Signed)
Breakfast Ordered 

## 2020-05-13 NOTE — Progress Notes (Signed)
PROGRESS NOTE    Angelica Bell  [id number] DOB: 1932/06/29 DOA: 05/12/2020 PCP: Heywood Bene, PA-C    Brief Narrative:  Angelica Bell is a 86 year old Caucasian female with past medical history remarkable for chronic pain on opioid treatment managed by PMNR, osteoporosis, CVA, essential hypertension who presented with fall at home.  Patient has had progressive worsening debility over the past few months, now appears to be wheelchair-bound.  She reported that she was transferring from her wheelchair to the bed in which she failed, denying hitting her head.  She was confused and her grandson Angelica Bell who checks on her found her on the floor at home for on known amount of time.  Per EMS report, she was found to be breathing 4 times a minute with SPO2 of 38%.  She was given Narcan with improvement of her mental status and she was noted to be hypoxic in the mid 80s on arrival to the ED.  Currently complaining of low back pain which she says is chronic. She thinks she may have taken too much oxycodone. She is unable to tell me exactly how much she normally takes. Denies other drug use.   In the ED, patient was afebrile with WBC count 11.7, hemoglobin 10.9.  Sodium 135, potassium 4.3, creatinine 1.59.  UDS positive for opiates.  CT abdomen/pelvis with no acute intra-abdominal findings.  EtOH level less than 10.  Covid-19 PCR negative.  Urinalysis unrevealing.  CT head/C-spine with no acute intracranial abnormalities but does note chronic atrophic/ischemic changes and no fracture or subluxation in regards to the C-spine.  Etiology of her presenting symptoms likely secondary to unintentional narcotics overdose.  TRH was consulted for further evaluation and management.   Assessment & Plan:   Active Problems:   ANXIETY DEPRESSION   Chronic pain   AKI (acute kidney injury) ([place])   Opioid overdose ([place])   Unintentional opioid overdose Acute respiratory failure with hypoxia Patient  with history of chronic pain on oxycodone 30 mg 3 times daily as needed, managed by PM&R Dr. Nelva Bush.  Patient has had a significant decline in her mobility, and unable to recall her home medication dosing.  Patient was found down at home hypoxic with decreased respiratory rate with rapid recovery following Narcan administration.  She remained mildly hypoxic in the ED.  UDS unrevealing other than opioids which she is prescribed, CT head and C-spine unrevealing and unlikely infectious process with negative Covid, urinalysis. --Resume oxycodone at a lower dose 5 mg q8h prn to avoid opiate withdrawals --Continue supplemental oxygen, titrate to maintain SPO2 greater than 92%  Weakness, debility Patient reports progressive loss of ambulatory status, now utilizes wheelchair much of the day.  Patient currently lives alone, her grandson checks on her usually once daily.  Concerned that patient has inability to manage herself at home in terms of her ADLs. --PT/OT evaluation: Pending  Acute kidney injury Etiology likely prerenal from prolonged downtime.  Received 500 cc bolus in ED.  CT abdomen/pelvis with no acute intra-abdominal pathology. --Cr 1.59 --NS at 100 mL/h --Repeat BMP in a.m.  Normocytic anemia Hemoglobin 10.9, baseline  Depression: Continue home escitalopram  Overactive bladder: Continue home Myrbetriq  Chronic pain syndrome On oxycodone 30 mg p.o. 3 times daily prn and gabapentin at home.  Follows with physical medicine and rehabilitation, Dr. Nelva Bush. --Continue gabapentin --Resume oxycodone at a lower dose 5 mg every 8 hours as needed  Essential hypertension BP 104/44, on amlodipine at home. --Hold home amlodipine  --  Continue aspirin    DVT prophylaxis: Heparin Code Status: Full code Family Communication: No family present at bedside  Disposition Plan:  Status is: Inpatient  Remains inpatient appropriate because:Ongoing diagnostic testing needed not appropriate for  outpatient work up, Unsafe d/c plan and IV treatments appropriate due to intensity of illness or inability to take PO   Dispo: The patient is from: Home              Anticipated d/c is to: To be determined              Anticipated d/c date is: 2 days              Patient currently is not medically stable to d/c.         Consultants:   None  Procedures:   None  Antimicrobials:   None   Subjective: Patient seen and examined bedside, resting comfortably.  Does not recall events leading to hospitalization.  States has been utilizing a wheelchair for several weeks now.  Cannot recall how much oxycodone she takes, reports that she may have taken a little too much yesterday.  No other specific complaints or concerns at this time.  Denies headache, no fever/chills/night sweats, no nausea/vomiting/diarrhea, no chest pain, no palpitations, no shortness of breath, no abdominal pain.  No acute events overnight per nursing staff.  Objective: Vitals:   05/13/20 0715 05/13/20 0730 05/13/20 1000 05/13/20 1030  BP: (!) 91/44 (!) 92/42 (!) 107/52 (!) 105/46  Pulse: 67 68 74 69  Resp: 15 13 16 13   Temp:      TempSrc:      SpO2:  100% 96%   Weight:      Height:        Intake/Output Summary (Last 24 hours) at 05/13/2020 1133 Last data filed at 05/13/2020 0206 Gross per 24 hour  Intake --  Output 212 ml  Net -212 ml   Filed Weights   05/12/20 2005  Weight: 44 kg    Examination:  General exam: Appears calm and comfortable  Respiratory system: Clear to auscultation. Respiratory effort normal. Cardiovascular system: S1 & S2 heard, RRR. No JVD, murmurs, rubs, gallops or clicks. No pedal edema. Gastrointestinal system: Abdomen is nondistended, soft and nontender. No organomegaly or masses felt. Normal bowel sounds heard. Central nervous system: Alert and oriented. No focal neurological deficits. Extremities: Symmetric 5 x 5 power. Skin: No rashes, lesions or ulcers Psychiatry:  Judgement and insight appear poor. Mood & affect appropriate.     Data Reviewed: I have personally reviewed following labs and imaging studies  CBC: Recent Labs  Lab 05/12/20 2103  WBC 11.7*  NEUTROABS 9.3*  HGB 10.9*  HCT 35.7*  MCV 91.8  PLT 0000000   Basic Metabolic Panel: Recent Labs  Lab 05/12/20 2103  NA 135  K 4.3  CL 102  CO2 25  GLUCOSE 126*  BUN 21  CREATININE 1.59*  CALCIUM 9.1   GFR: Estimated Creatinine Clearance: 17.6 mL/min (A) (by C-G formula based on SCr of 1.59 mg/dL (H)). Liver Function Tests: Recent Labs  Lab 05/12/20 2103  AST 24  ALT 15  ALKPHOS 64  BILITOT 0.7  PROT 5.3*  ALBUMIN 3.2*   No results for input(s): LIPASE, AMYLASE in the last 168 hours. No results for input(s): AMMONIA in the last 168 hours. Coagulation Profile: No results for input(s): INR, PROTIME in the last 168 hours. Cardiac Enzymes: Recent Labs  Lab 05/12/20 2100  CKTOTAL 167  BNP (last 3 results) No results for input(s): PROBNP in the last 8760 hours. HbA1C: No results for input(s): HGBA1C in the last 72 hours. CBG: No results for input(s): GLUCAP in the last 168 hours. Lipid Profile: No results for input(s): CHOL, HDL, LDLCALC, TRIG, CHOLHDL, LDLDIRECT in the last 72 hours. Thyroid Function Tests: No results for input(s): TSH, T4TOTAL, FREET4, T3FREE, THYROIDAB in the last 72 hours. Anemia Panel: Recent Labs    05/13/20 0743  FERRITIN 43  TIBC 288  IRON 69   Sepsis Labs: Recent Labs  Lab 05/12/20 2103 05/12/20 2311  LATICACIDVEN 0.9 0.8    Recent Results (from the past 240 hour(s))  Culture, blood (routine x 2)     Status: None (Preliminary result)   Collection Time: 05/12/20  9:04 PM   Specimen: BLOOD  Result Value Ref Range Status   Specimen Description BLOOD SITE NOT SPECIFIED  Final   Special Requests   Final    BOTTLES DRAWN AEROBIC AND ANAEROBIC Blood Culture results may not be optimal due to an inadequate volume of blood received in  culture bottles   Culture   Final    NO GROWTH < 12 HOURS Performed at [facility], [place] [address] [place], [place] [address]    Report Status PENDING  Incomplete  SARS Coronavirus 2 by RT PCR (hospital order, performed in [facility] hospital lab) Nasopharyngeal Nasopharyngeal Swab     Status: None   Collection Time: 05/12/20 10:18 PM   Specimen: Nasopharyngeal Swab  Result Value Ref Range Status   SARS Coronavirus 2 NEGATIVE NEGATIVE Final    Comment: (NOTE) SARS-CoV-2 target nucleic acids are NOT DETECTED. The SARS-CoV-2 RNA is generally detectable in upper and lower respiratory specimens during the acute phase of infection. The lowest concentration of SARS-CoV-2 viral copies this assay can detect is 250 copies / mL. A negative result does not preclude SARS-CoV-2 infection and should not be used as the sole basis for treatment or other patient management decisions.  A negative result may occur with improper specimen collection / handling, submission of specimen other than nasopharyngeal swab, presence of viral mutation(s) within the areas targeted by this assay, and inadequate number of viral copies (<250 copies / mL). A negative result must be combined with clinical observations, patient history, and epidemiological information. Fact Sheet for Patients:   StrictlyIdeas.no Fact Sheet for Healthcare Providers: BankingDealers.co.za This test is not yet approved or cleared  by the [place] FDA and has been authorized for detection and/or diagnosis of SARS-CoV-2 by FDA under an Emergency Use Authorization (EUA).  This EUA will remain in effect (meaning this test can be used) for the duration of the COVID-19 declaration under Section 564(b)(1) of the Act, 21 U.S.C. section 360bbb-3(b)(1), unless the authorization is terminated or revoked sooner. Performed at [facility], [place] [address], [place], [place] [address]     Culture, blood (routine x 2)     Status: None (Preliminary result)   Collection Time: 05/12/20 11:04 PM   Specimen: BLOOD  Result Value Ref Range Status   Specimen Description BLOOD RIGHT ANTECUBITAL  Final   Special Requests   Final    BOTTLES DRAWN AEROBIC AND ANAEROBIC Blood Culture adequate volume   Culture   Final    NO GROWTH < 12 HOURS Performed at [facility], [place] [address], [place], [place] [address]    Report Status PENDING  Incomplete  Radiology Studies: CT Abdomen Pelvis Wo Contrast  Result Date: 05/12/2020 CLINICAL DATA:  Recent trip and fall with abdominal pain, initial encounter EXAM: CT ABDOMEN AND PELVIS WITHOUT CONTRAST TECHNIQUE: Multidetector CT imaging of the abdomen and pelvis was performed following the standard protocol without IV contrast. COMPARISON:  03/08/2020 FINDINGS: Lower chest: Lung bases again demonstrate some mild scarring stable from the prior exam. Hiatal hernia is again Hepatobiliary: No focal liver abnormality is seen. No gallstones, gallbladder wall thickening, or biliary dilatation. Pancreas: Unremarkable. No pancreatic ductal dilatation or surrounding inflammatory changes. Spleen: Normal in size without focal abnormality. Adrenals/Urinary Tract: Adrenal glands are within normal limits. Kidneys demonstrate no renal calculi or obstructive changes. Bladder is well distended. Stomach/Bowel: Diverticular change of the colon is noted without evidence of diverticulitis. No obstructive or inflammatory changes are noted. The appendix is not well visualized consistent with prior surgical excision. Small bowel is unremarkable. No gastric abnormality is noted with the exception of the previously mentioned hiatal hernia. Vascular/Lymphatic: Aortic calcifications are noted. No significant lymphadenopathy is noted. Left-sided IVC is noted and stable. Reproductive: Status post hysterectomy. No adnexal masses. Other: No abdominal wall hernia or  abnormality. No abdominopelvic ascites. Musculoskeletal: Right hip replacement is seen. Surgical fixation of the proximal left hip is noted. Degenerative changes of the lumbar spine are seen. Scoliosis concave to the right is again noted. No acute abnormality is noted. IMPRESSION: Chronic changes as described above without acute abnormality. Electronically Signed   By: Inez Catalina M.D.   On: 05/12/2020 22:57   DG Chest 1 View  Result Date: 05/12/2020 CLINICAL DATA:  Pain after fall. EXAM: CHEST  1 VIEW COMPARISON:  Frontal and lateral views 03/30/2020. Chest CT 03/08/2020 FINDINGS: Normal heart size. Unchanged mediastinal contours with aortic atherosclerosis. No focal airspace disease. No pneumothorax or pleural effusion. Healing right rib fractures again seen. Remote left proximal humerus fracture. No acute osseous abnormalities are seen on this single frontal view. Scoliotic curvature of the spine. IMPRESSION: No acute finding. Electronically Signed   By: Keith Rake M.D.   On: 05/12/2020 20:31   CT Head Wo Contrast  Result Date: 05/12/2020 CLINICAL DATA:  Recent fall EXAM: CT HEAD WITHOUT CONTRAST CT CERVICAL SPINE WITHOUT CONTRAST TECHNIQUE: Multidetector CT imaging of the head and cervical spine was performed following the standard protocol without intravenous contrast. Multiplanar CT image reconstructions of the cervical spine were also generated. COMPARISON:  03/08/2020 FINDINGS: CT HEAD FINDINGS Brain: Chronic atrophic and ischemic changes are noted. No findings to suggest acute hemorrhage, acute infarction or space-occupying mass lesion are seen. Vascular: No hyperdense vessel or unexpected calcification. Skull: Normal. Negative for fracture or focal lesion. Sinuses/Orbits: No acute finding. Other: None. CT CERVICAL SPINE FINDINGS Alignment: Within normal limits. Skull base and vertebrae: 7 cervical segments are well visualized. Vertebral body height is well maintained. Mild disc space  narrowing is noted from C4-C7 with associated osteophytic changes. Mild facet hypertrophic changes are seen. The odontoid is within normal limits. No findings to suggest acute fracture or acute facet abnormality are noted. Soft tissues and spinal canal: Surrounding soft tissue structures show significant vascular calcifications. The thyroid demonstrates multiple hypodense nodules. These are stable in appearance from a prior CT from 2018 and felt to be benign in etiology. Upper chest: Visualized lung apices are within normal limits. Other: None IMPRESSION: CT of the head: Chronic atrophic and ischemic changes without acute abnormality. CT of the cervical spine: Multilevel degenerative change without acute abnormality. Multiple hypodense nodules throughout  the thyroid gland stable from 2018. No followup recommended (ref: J Am Coll Radiol. 2015 Feb;12(2): 143-50). Electronically Signed   By: Inez Catalina M.D.   On: 05/12/2020 20:23   CT Cervical Spine Wo Contrast  Result Date: 05/12/2020 CLINICAL DATA:  Recent fall EXAM: CT HEAD WITHOUT CONTRAST CT CERVICAL SPINE WITHOUT CONTRAST TECHNIQUE: Multidetector CT imaging of the head and cervical spine was performed following the standard protocol without intravenous contrast. Multiplanar CT image reconstructions of the cervical spine were also generated. COMPARISON:  03/08/2020 FINDINGS: CT HEAD FINDINGS Brain: Chronic atrophic and ischemic changes are noted. No findings to suggest acute hemorrhage, acute infarction or space-occupying mass lesion are seen. Vascular: No hyperdense vessel or unexpected calcification. Skull: Normal. Negative for fracture or focal lesion. Sinuses/Orbits: No acute finding. Other: None. CT CERVICAL SPINE FINDINGS Alignment: Within normal limits. Skull base and vertebrae: 7 cervical segments are well visualized. Vertebral body height is well maintained. Mild disc space narrowing is noted from C4-C7 with associated osteophytic changes. Mild  facet hypertrophic changes are seen. The odontoid is within normal limits. No findings to suggest acute fracture or acute facet abnormality are noted. Soft tissues and spinal canal: Surrounding soft tissue structures show significant vascular calcifications. The thyroid demonstrates multiple hypodense nodules. These are stable in appearance from a prior CT from 2018 and felt to be benign in etiology. Upper chest: Visualized lung apices are within normal limits. Other: None IMPRESSION: CT of the head: Chronic atrophic and ischemic changes without acute abnormality. CT of the cervical spine: Multilevel degenerative change without acute abnormality. Multiple hypodense nodules throughout the thyroid gland stable from 2018. No followup recommended (ref: J Am Coll Radiol. 2015 Feb;12(2): 143-50). Electronically Signed   By: Inez Catalina M.D.   On: 05/12/2020 20:23   DG Hips Bilat W or Wo Pelvis 3-4 Views  Result Date: 05/12/2020 CLINICAL DATA:  Pain after fall. EXAM: DG HIP (WITH OR WITHOUT PELVIS) 3-4V BILAT COMPARISON:  Hip radiograph 02/05/2020, right hip CT same day. FINDINGS: Right hip arthroplasty in expected alignment. No periprosthetic lucency or fracture. Intramedullary rod with locking and trans trochanteric screw in the left proximal femur. No periprosthetic lucency or fracture. No evidence of pelvic ring or pubic rami fracture. Degenerative change of the pubic symphysis and sacroiliac joints. Scoliotic curvature of the included lower lumbar spine. IMPRESSION: 1. No acute fracture of the pelvis or hips. 2. Intact right hip arthroplasty and surgical hardware in the left proximal femur. No hardware complication or periprosthetic fracture. Electronically Signed   By: Keith Rake M.D.   On: 05/12/2020 20:35        Scheduled Meds: . aspirin EC  81 mg Oral Daily  . escitalopram  20 mg Oral Daily  . heparin  5,000 Units Subcutaneous Q8H  . mirabegron ER  25 mg Oral Daily   Continuous  Infusions: . sodium chloride 100 mL/hr at 05/13/20 0135     LOS: 0 days    Time spent: 38 minutes spent on chart review, discussion with nursing staff, consultants, updating family and interview/physical exam; more than 50% of that time was spent in counseling and/or coordination of care.    Meli Faley J [place] DO [facility] Available via Epic secure chat 7am-7pm After these hours, please refer to coverage provider listed on [email or web address] 05/13/2020, 11:33 AM

## 2020-05-13 NOTE — ED Notes (Signed)
Patient assisted to bedside commode and back to stretcher when finished. Pt also provided heat pack for R knee pain.

## 2020-05-14 DIAGNOSIS — F341 Dysthymic disorder: Secondary | ICD-10-CM

## 2020-05-14 LAB — BASIC METABOLIC PANEL
Anion gap: 4 — ABNORMAL LOW (ref 5–15)
BUN: 15 mg/dL (ref 8–23)
CO2: 24 mmol/L (ref 22–32)
Calcium: 9.1 mg/dL (ref 8.9–10.3)
Chloride: 112 mmol/L — ABNORMAL HIGH (ref 98–111)
Creatinine, Ser: 0.83 mg/dL (ref 0.44–1.00)
GFR calc Af Amer: >60 mL/min (ref >60)
GFR calc non Af Amer: >60 mL/min (ref >60)
Glucose, Bld: 109 mg/dL — ABNORMAL HIGH (ref 70–99)
Potassium: 4 mmol/L (ref 3.5–5.1)
Sodium: 140 mmol/L (ref 135–145)

## 2020-05-14 LAB — MAGNESIUM: Magnesium: 1.6 mg/dL — ABNORMAL LOW (ref 1.7–2.4)

## 2020-05-14 MED ORDER — MAGNESIUM SULFATE 2 GM/50ML IV SOLN
2.0000 g | Freq: Once | INTRAVENOUS | Status: AC
  Administered 2020-05-14: 2 g via INTRAVENOUS
  Filled 2020-05-14: qty 50

## 2020-05-14 NOTE — Plan of Care (Signed)
  Problem: Education: Goal: Knowledge of General Education information will improve Description: Including pain rating scale, medication(s)/side effects and non-pharmacologic comfort measures Outcome: Progressing   Problem: Health Behavior/Discharge Planning: Goal: Ability to manage health-related needs will improve Outcome: Progressing   Problem: Coping: Goal: Level of anxiety will decrease Outcome: Progressing   Problem: Elimination: Goal: Will not experience complications related to bowel motility Outcome: Progressing   Problem: Pain Managment: Goal: General experience of comfort will improve Outcome: Progressing   Problem: Safety: Goal: Ability to remain free from injury will improve Outcome: Progressing

## 2020-05-14 NOTE — Evaluation (Signed)
Physical Therapy Evaluation Patient Details Name: Donna-Marie Cheak MRN: [id number] DOB: [birth year] Today's Date: 05/14/2020   History of Present Illness  Pt is an 86 yo female presenting after being found down at her home with low RR and SpO2 after a fall while transfering from her WC to bed. The pt also reports generally worsening debility for several months. PMH includes: chronic pain on daily opiods, osteoporosis, CVA, HTN, and frequent falls.  Clinical Impression  Pt in bed upon arrival of PT, agreeable to evaluation at this time. Prior to admission the pt was mobilizing with use of a WC in he home, alone during the day with daily checks from her grandson who provides her with food. The pt reports she is independent with ADLs. The pt now presents with limitations in functional mobility, endurance, strength, and stability as well as poor cognition and insight into safety and deficits. Therefore, the pt will continue to benefit from skilled PT to address these deficits. The pt was able to demo transfers with minG for safety and use of RW without LOB, but demos poor problem solving and safety awareness that would significantly impair her ability to safely live alone without any supervision. Therefore, I recommend SNF placement with 24/7 supervision for assistance unless this can be provided by the family, as well as continued skilled PT to progress strength and safety with transfers.      Follow Up Recommendations SNF;Supervision/Assistance - 24 hour    Equipment Recommendations  (defer to post acute)    Recommendations for Other Services       Precautions / Restrictions Precautions Precautions: Fall Restrictions Weight Bearing Restrictions: No      Mobility  Bed Mobility Overal bed mobility: Modified Independent                Transfers Overall transfer level: Needs assistance Equipment used: Rolling walker (2 wheeled) Transfers: Sit to/from [employer] Sit to Stand: Supervision Stand pivot transfers: Supervision       General transfer comment: pt stating "don't help me" while standing from EOB to pivot to recliner. able to power up without assist, good management of RW, supervision for safety  Ambulation/Gait Ambulation/Gait assistance: Min guard Gait Distance (Feet): 5 Feet Assistive device: Rolling walker (2 wheeled) Gait Pattern/deviations: Step-to pattern;Decreased stride length;Shuffle;Narrow base of support;Trunk flexed Gait velocity: decreased Gait velocity interpretation: <1.31 ft/sec, indicative of household ambulator General Gait Details: pt able to complete some steps in room, but does not ambulate much at home. significant reliance on BUE  Stairs            Wheelchair Mobility    Modified Rankin (Stroke Patients Only)       Balance Overall balance assessment: Needs assistance   Sitting balance-Leahy Scale: Fair     Standing balance support: Bilateral upper extremity supported Standing balance-Leahy Scale: Poor Standing balance comment: reliant on BUE                             Pertinent Vitals/Pain Pain Assessment: Faces Faces Pain Scale: Hurts little more Pain Location: feet and back Pain Descriptors / Indicators: Grimacing;Sore Pain Intervention(s): Limited activity within patient's tolerance;Repositioned    Home Living Family/patient expects to be discharged to:: Private residence Living Arrangements: Alone Available Help at Discharge: Family;Available PRN/intermittently Type of Home: House Home Access: Stairs to enter Entrance Stairs-Rails: None Entrance Stairs-Number of Steps: 1 Home Layout: One level Home Equipment: None Additional  Comments: some info from previous admission, some from pt, unsure of reliability of pt    Prior Function Level of Independence: Needs assistance   Gait / Transfers Assistance Needed: non ambulatory per pt, uses w/c in the house,  transfers (?squat pivot) independently  ADL's / Homemaking Assistance Needed: grandson brings take out when he visits morning and night, pt does bathe and dress independently  Comments: reports using WC for mobility around home, independent ADLs, limited IADLs      Hand Dominance   Dominant Hand: Right    Extremity/Trunk Assessment   Upper Extremity Assessment Upper Extremity Assessment: Generalized weakness    Lower Extremity Assessment Lower Extremity Assessment: Generalized weakness    Cervical / Trunk Assessment Cervical / Trunk Assessment: Kyphotic  Communication   Communication: HOH  Cognition Arousal/Alertness: Awake/alert Behavior During Therapy: Restless Overall Cognitive Status: No family/caregiver present to determine baseline cognitive functioning Area of Impairment: Attention;Memory;Safety/judgement;Awareness;Problem solving                   Current Attention Level: Focused Memory: Decreased recall of precautions;Decreased short-term memory   Safety/Judgement: Decreased awareness of safety;Decreased awareness of deficits Awareness: Intellectual Problem Solving: Difficulty sequencing;Requires verbal cues General Comments: Pt with poor insight to deficits, unable to utilize call bell or verbalize needs despite continued prompts or assist.      General Comments      Exercises     Assessment/Plan    PT Assessment Patient needs continued PT services  PT Problem List Decreased strength;Decreased mobility;Decreased safety awareness;Decreased range of motion;Decreased activity tolerance;Decreased balance;Decreased knowledge of use of DME;Decreased cognition;Decreased coordination       PT Treatment Interventions Gait training;Stair training;Functional mobility training;Therapeutic activities;Patient/family education;Balance training;Therapeutic exercise;DME instruction    PT Goals (Current goals can be found in the Care Plan section)  Acute Rehab  PT Goals Patient Stated Goal: get warm PT Goal Formulation: With patient Time For Goal Achievement: 05/28/20 Potential to Achieve Goals: Fair    Frequency Min 3X/week(increased frequcny due to pt likely to decline SNF)   Barriers to discharge Decreased caregiver support pt home alone during day    Co-evaluation               AM-PAC PT "6 Clicks" Mobility  Outcome Measure Help needed turning from your back to your side while in a flat bed without using bedrails?: A Little Help needed moving from lying on your back to sitting on the side of a flat bed without using bedrails?: A Little Help needed moving to and from a bed to a chair (including a wheelchair)?: A Little Help needed standing up from a chair using your arms (e.g., wheelchair or bedside chair)?: A Little Help needed to walk in hospital room?: A Lot Help needed climbing 3-5 steps with a railing? : Total 6 Click Score: 15    End of Session Equipment Utilized During Treatment: Gait belt Activity Tolerance: Patient tolerated treatment well(pt stating "i'm cold" and refusing to progress mobility or activity wanting to get back under covers) Patient left: in bed;with call bell/phone within reach;with bed alarm set Nurse Communication: Mobility status PT Visit Diagnosis: History of falling (Z91.81);Muscle weakness (generalized) (M62.81);Difficulty in walking, not elsewhere classified (R26.2);Pain Pain - Right/Left: (central) Pain - part of body: (back)    Time: [id number] PT Time Calculation (min) (ACUTE ONLY): 23 min   Charges:   PT Evaluation $PT Eval Moderate Complexity: 1 Mod PT Treatments $Gait Training: 8-22 mins  Hardie Pulley, DPT   Acute Rehabilitation Department Pager #: [phone]  Otho Bellows 05/14/2020, 3:44 PM

## 2020-05-14 NOTE — Progress Notes (Signed)
PROGRESS NOTE    Angelica Bell  [id number] DOB: May 06, 1932 DOA: 05/12/2020 PCP: Heywood Bene, PA-C    Brief Narrative:  Angelica Bell is a 86 year old Caucasian female with past medical history remarkable for chronic pain on opioid treatment managed by PMNR, osteoporosis, CVA, essential hypertension who presented with fall at home.  Patient has had progressive worsening debility over the past few months, now appears to be wheelchair-bound.  She reported that she was transferring from her wheelchair to the bed in which she failed, denying hitting her head.  She was confused and her grandson [name] who checks on her found her on the floor at home for on known amount of time.  Per EMS report, she was found to be breathing 4 times a minute with SPO2 of 38%.  She was given Narcan with improvement of her mental status and she was noted to be hypoxic in the mid 80s on arrival to the ED.  Currently complaining of low back pain which she says is chronic. She thinks she may have taken too much oxycodone. She is unable to tell me exactly how much she normally takes. Denies other drug use.   In the ED, patient was afebrile with WBC count 11.7, hemoglobin 10.9.  Sodium 135, potassium 4.3, creatinine 1.59.  UDS positive for opiates.  CT abdomen/pelvis with no acute intra-abdominal findings.  EtOH level less than 10.  Covid-19 PCR negative.  Urinalysis unrevealing.  CT head/C-spine with no acute intracranial abnormalities but does note chronic atrophic/ischemic changes and no fracture or subluxation in regards to the C-spine.  Etiology of her presenting symptoms likely secondary to unintentional narcotics overdose.  TRH was consulted for further evaluation and management.   Assessment & Plan:   Active Problems:   ANXIETY DEPRESSION   Chronic pain   AKI (acute kidney injury) ([place])   Opioid overdose ([place])   Unintentional opioid overdose Acute respiratory failure with hypoxia Patient  with history of chronic pain on oxycodone 30 mg 3 times daily as needed, managed by PM&R Dr. Nelva Bush.  Patient has had a significant decline in her mobility, and unable to recall her home medication dosing.  Patient was found down at home hypoxic with decreased respiratory rate with rapid recovery following Narcan administration.  She remained mildly hypoxic in the ED.  UDS unrevealing other than opioids which she is prescribed, CT head and C-spine unrevealing and unlikely infectious process with negative Covid, urinalysis. --Resume oxycodone at a lower dose 5 mg q8h prn to avoid opiate withdrawals --Continue supplemental oxygen, titrate to maintain SPO2 greater than 92%  Weakness, debility Patient reports progressive loss of ambulatory status, now utilizes wheelchair much of the day.  Patient currently lives alone, her grandson checks on her usually once daily.  Concerned that patient has inability to manage herself at home in terms of her ADLs. --Still awaiting PT/OT evaluation  Acute kidney injury Etiology likely prerenal from prolonged downtime.  Received 500 cc bolus in ED.  CT abdomen/pelvis with no acute intra-abdominal pathology. --Cr 1.59 --NS at 100 mL/h --Repeat BMP in a.m.  Normocytic anemia Hemoglobin 10.9, baseline  Depression: Continue home escitalopram  Overactive bladder: Continue home Myrbetriq  Chronic pain syndrome On oxycodone 30 mg p.o. 3 times daily prn and gabapentin at home.  Follows with physical medicine and rehabilitation, Dr. Nelva Bush. --Continue gabapentin --Resume oxycodone at a lower dose; 5 mg every 8 hours as needed  Essential hypertension BP 104/44, on amlodipine at home. --Hold home amlodipine  --  Continue aspirin    DVT prophylaxis: Heparin Code Status: Full code Family Communication: No family present at bedside  Disposition Plan:  Status is: Inpatient  Remains inpatient appropriate because:Unsafe d/c plan still awaiting PT/OT  evaluation.   Dispo: The patient is from: Home              Anticipated d/c is to: To be determined              Anticipated d/c date is: 2 days              Patient currently is not medically stable to d/c.   Consultants:   None  Procedures:   None  Antimicrobials:   None   Subjective: Patient seen and examined bedside, resting comfortably. Eating breakfast. States she is doing fair today. Still awaiting PT/OT evaluation for discharge needs, as she lives alone and has apparently progressed to being fairly wheelchair dependent. No other specific complaints or concerns at this time.  Denies headache, no fever/chills/night sweats, no nausea/vomiting/diarrhea, no chest pain, no palpitations, no shortness of breath, no abdominal pain.  No acute events overnight per nursing staff.  Objective: Vitals:   05/13/20 1740 05/13/20 1929 05/14/20 0331 05/14/20 0807  BP:  114/67 (!) 115/57 (!) 144/67  Pulse:  71 87 71  Resp:  18 16 17   Temp:  98 F (36.7 C) 98 F (36.7 C) 98.6 F (37 C)  TempSrc:    Oral  SpO2: 98% 98% 98% 95%  Weight:      Height:        Intake/Output Summary (Last 24 hours) at 05/14/2020 1146 Last data filed at 05/14/2020 0900 Gross per 24 hour  Intake 240 ml  Output 410 ml  Net -170 ml   Filed Weights   05/12/20 2005  Weight: 44 kg    Examination:  General exam: Appears calm and comfortable  Respiratory system: Clear to auscultation. Respiratory effort normal. Oxygenating well on room air Cardiovascular system: S1 & S2 heard, RRR. No JVD, murmurs, rubs, gallops or clicks. No pedal edema. Gastrointestinal system: Abdomen is nondistended, soft and nontender. No organomegaly or masses felt. Normal bowel sounds heard. Central nervous system: Alert and oriented. No focal neurological deficits. Extremities: Symmetric 5 x 5 power. Skin: No rashes, lesions or ulcers Psychiatry: Judgement and insight appear poor. Mood & affect appropriate.     Data  Reviewed: I have personally reviewed following labs and imaging studies  CBC: Recent Labs  Lab 05/12/20 2103  WBC 11.7*  NEUTROABS 9.3*  HGB 10.9*  HCT 35.7*  MCV 91.8  PLT 0000000   Basic Metabolic Panel: Recent Labs  Lab 05/12/20 2103 05/14/20 0235  NA 135 140  K 4.3 4.0  CL 102 112*  CO2 25 24  GLUCOSE 126* 109*  BUN 21 15  CREATININE 1.59* 0.83  CALCIUM 9.1 9.1  MG  --  1.6*   GFR: Estimated Creatinine Clearance: 33.8 mL/min (by C-G formula based on SCr of 0.83 mg/dL). Liver Function Tests: Recent Labs  Lab 05/12/20 2103  AST 24  ALT 15  ALKPHOS 64  BILITOT 0.7  PROT 5.3*  ALBUMIN 3.2*   No results for input(s): LIPASE, AMYLASE in the last 168 hours. No results for input(s): AMMONIA in the last 168 hours. Coagulation Profile: No results for input(s): INR, PROTIME in the last 168 hours. Cardiac Enzymes: Recent Labs  Lab 05/12/20 2100  CKTOTAL 167   BNP (last 3 results) No results for input(s): PROBNP  in the last 8760 hours. HbA1C: No results for input(s): HGBA1C in the last 72 hours. CBG: No results for input(s): GLUCAP in the last 168 hours. Lipid Profile: No results for input(s): CHOL, HDL, LDLCALC, TRIG, CHOLHDL, LDLDIRECT in the last 72 hours. Thyroid Function Tests: No results for input(s): TSH, T4TOTAL, FREET4, T3FREE, THYROIDAB in the last 72 hours. Anemia Panel: Recent Labs    05/13/20 0743  FERRITIN 43  TIBC 288  IRON 69   Sepsis Labs: Recent Labs  Lab 05/12/20 2103 05/12/20 2311  LATICACIDVEN 0.9 0.8    Recent Results (from the past 240 hour(s))  Culture, blood (routine x 2)     Status: None (Preliminary result)   Collection Time: 05/12/20  9:04 PM   Specimen: BLOOD  Result Value Ref Range Status   Specimen Description BLOOD SITE NOT SPECIFIED  Final   Special Requests   Final    BOTTLES DRAWN AEROBIC AND ANAEROBIC Blood Culture results may not be optimal due to an inadequate volume of blood received in culture bottles    Culture   Final    NO GROWTH 2 DAYS Performed at [facility], [place] [address] [place], [place] [address]    Report Status PENDING  Incomplete  SARS Coronavirus 2 by RT PCR (hospital order, performed in [facility] hospital lab) Nasopharyngeal Nasopharyngeal Swab     Status: None   Collection Time: 05/12/20 10:18 PM   Specimen: Nasopharyngeal Swab  Result Value Ref Range Status   SARS Coronavirus 2 NEGATIVE NEGATIVE Final    Comment: (NOTE) SARS-CoV-2 target nucleic acids are NOT DETECTED. The SARS-CoV-2 RNA is generally detectable in upper and lower respiratory specimens during the acute phase of infection. The lowest concentration of SARS-CoV-2 viral copies this assay can detect is 250 copies / mL. A negative result does not preclude SARS-CoV-2 infection and should not be used as the sole basis for treatment or other patient management decisions.  A negative result may occur with improper specimen collection / handling, submission of specimen other than nasopharyngeal swab, presence of viral mutation(s) within the areas targeted by this assay, and inadequate number of viral copies (<250 copies / mL). A negative result must be combined with clinical observations, patient history, and epidemiological information. Fact Sheet for Patients:   StrictlyIdeas.no Fact Sheet for Healthcare Providers: BankingDealers.co.za This test is not yet approved or cleared  by the [place] FDA and has been authorized for detection and/or diagnosis of SARS-CoV-2 by FDA under an Emergency Use Authorization (EUA).  This EUA will remain in effect (meaning this test can be used) for the duration of the COVID-19 declaration under Section 564(b)(1) of the Act, 21 U.S.C. section 360bbb-3(b)(1), unless the authorization is terminated or revoked sooner. Performed at [facility], [place] [address] [place], [place] [address]   Culture, blood (routine  x 2)     Status: None (Preliminary result)   Collection Time: 05/12/20 11:04 PM   Specimen: BLOOD  Result Value Ref Range Status   Specimen Description BLOOD RIGHT ANTECUBITAL  Final   Special Requests   Final    BOTTLES DRAWN AEROBIC AND ANAEROBIC Blood Culture adequate volume   Culture   Final    NO GROWTH 2 DAYS Performed at [facility], [place] [address] [place], [place] [address]    Report Status PENDING  Incomplete         Radiology Studies: CT Abdomen Pelvis Wo Contrast  Result Date: 05/12/2020 CLINICAL  DATA:  Recent trip and fall with abdominal pain, initial encounter EXAM: CT ABDOMEN AND PELVIS WITHOUT CONTRAST TECHNIQUE: Multidetector CT imaging of the abdomen and pelvis was performed following the standard protocol without IV contrast. COMPARISON:  03/08/2020 FINDINGS: Lower chest: Lung bases again demonstrate some mild scarring stable from the prior exam. Hiatal hernia is again Hepatobiliary: No focal liver abnormality is seen. No gallstones, gallbladder wall thickening, or biliary dilatation. Pancreas: Unremarkable. No pancreatic ductal dilatation or surrounding inflammatory changes. Spleen: Normal in size without focal abnormality. Adrenals/Urinary Tract: Adrenal glands are within normal limits. Kidneys demonstrate no renal calculi or obstructive changes. Bladder is well distended. Stomach/Bowel: Diverticular change of the colon is noted without evidence of diverticulitis. No obstructive or inflammatory changes are noted. The appendix is not well visualized consistent with prior surgical excision. Small bowel is unremarkable. No gastric abnormality is noted with the exception of the previously mentioned hiatal hernia. Vascular/Lymphatic: Aortic calcifications are noted. No significant lymphadenopathy is noted. Left-sided IVC is noted and stable. Reproductive: Status post hysterectomy. No adnexal masses. Other: No abdominal wall hernia or abnormality. No abdominopelvic ascites.  Musculoskeletal: Right hip replacement is seen. Surgical fixation of the proximal left hip is noted. Degenerative changes of the lumbar spine are seen. Scoliosis concave to the right is again noted. No acute abnormality is noted. IMPRESSION: Chronic changes as described above without acute abnormality. Electronically Signed   By: Inez Catalina M.D.   On: 05/12/2020 22:57   DG Chest 1 View  Result Date: 05/12/2020 CLINICAL DATA:  Pain after fall. EXAM: CHEST  1 VIEW COMPARISON:  Frontal and lateral views 03/30/2020. Chest CT 03/08/2020 FINDINGS: Normal heart size. Unchanged mediastinal contours with aortic atherosclerosis. No focal airspace disease. No pneumothorax or pleural effusion. Healing right rib fractures again seen. Remote left proximal humerus fracture. No acute osseous abnormalities are seen on this single frontal view. Scoliotic curvature of the spine. IMPRESSION: No acute finding. Electronically Signed   By: Keith Rake M.D.   On: 05/12/2020 20:31   CT Head Wo Contrast  Result Date: 05/12/2020 CLINICAL DATA:  Recent fall EXAM: CT HEAD WITHOUT CONTRAST CT CERVICAL SPINE WITHOUT CONTRAST TECHNIQUE: Multidetector CT imaging of the head and cervical spine was performed following the standard protocol without intravenous contrast. Multiplanar CT image reconstructions of the cervical spine were also generated. COMPARISON:  03/08/2020 FINDINGS: CT HEAD FINDINGS Brain: Chronic atrophic and ischemic changes are noted. No findings to suggest acute hemorrhage, acute infarction or space-occupying mass lesion are seen. Vascular: No hyperdense vessel or unexpected calcification. Skull: Normal. Negative for fracture or focal lesion. Sinuses/Orbits: No acute finding. Other: None. CT CERVICAL SPINE FINDINGS Alignment: Within normal limits. Skull base and vertebrae: 7 cervical segments are well visualized. Vertebral body height is well maintained. Mild disc space narrowing is noted from C4-C7 with associated  osteophytic changes. Mild facet hypertrophic changes are seen. The odontoid is within normal limits. No findings to suggest acute fracture or acute facet abnormality are noted. Soft tissues and spinal canal: Surrounding soft tissue structures show significant vascular calcifications. The thyroid demonstrates multiple hypodense nodules. These are stable in appearance from a prior CT from 2018 and felt to be benign in etiology. Upper chest: Visualized lung apices are within normal limits. Other: None IMPRESSION: CT of the head: Chronic atrophic and ischemic changes without acute abnormality. CT of the cervical spine: Multilevel degenerative change without acute abnormality. Multiple hypodense nodules throughout the thyroid gland stable from 2018. No followup recommended (ref: J Am  Coll Radiol. 2015 Feb;12(2): 143-50). Electronically Signed   By: Inez Catalina M.D.   On: 05/12/2020 20:23   CT Cervical Spine Wo Contrast  Result Date: 05/12/2020 CLINICAL DATA:  Recent fall EXAM: CT HEAD WITHOUT CONTRAST CT CERVICAL SPINE WITHOUT CONTRAST TECHNIQUE: Multidetector CT imaging of the head and cervical spine was performed following the standard protocol without intravenous contrast. Multiplanar CT image reconstructions of the cervical spine were also generated. COMPARISON:  03/08/2020 FINDINGS: CT HEAD FINDINGS Brain: Chronic atrophic and ischemic changes are noted. No findings to suggest acute hemorrhage, acute infarction or space-occupying mass lesion are seen. Vascular: No hyperdense vessel or unexpected calcification. Skull: Normal. Negative for fracture or focal lesion. Sinuses/Orbits: No acute finding. Other: None. CT CERVICAL SPINE FINDINGS Alignment: Within normal limits. Skull base and vertebrae: 7 cervical segments are well visualized. Vertebral body height is well maintained. Mild disc space narrowing is noted from C4-C7 with associated osteophytic changes. Mild facet hypertrophic changes are seen. The odontoid  is within normal limits. No findings to suggest acute fracture or acute facet abnormality are noted. Soft tissues and spinal canal: Surrounding soft tissue structures show significant vascular calcifications. The thyroid demonstrates multiple hypodense nodules. These are stable in appearance from a prior CT from 2018 and felt to be benign in etiology. Upper chest: Visualized lung apices are within normal limits. Other: None IMPRESSION: CT of the head: Chronic atrophic and ischemic changes without acute abnormality. CT of the cervical spine: Multilevel degenerative change without acute abnormality. Multiple hypodense nodules throughout the thyroid gland stable from 2018. No followup recommended (ref: J Am Coll Radiol. 2015 Feb;12(2): 143-50). Electronically Signed   By: Inez Catalina M.D.   On: 05/12/2020 20:23   DG Hips Bilat W or Wo Pelvis 3-4 Views  Result Date: 05/12/2020 CLINICAL DATA:  Pain after fall. EXAM: DG HIP (WITH OR WITHOUT PELVIS) 3-4V BILAT COMPARISON:  Hip radiograph 02/05/2020, right hip CT same day. FINDINGS: Right hip arthroplasty in expected alignment. No periprosthetic lucency or fracture. Intramedullary rod with locking and trans trochanteric screw in the left proximal femur. No periprosthetic lucency or fracture. No evidence of pelvic ring or pubic rami fracture. Degenerative change of the pubic symphysis and sacroiliac joints. Scoliotic curvature of the included lower lumbar spine. IMPRESSION: 1. No acute fracture of the pelvis or hips. 2. Intact right hip arthroplasty and surgical hardware in the left proximal femur. No hardware complication or periprosthetic fracture. Electronically Signed   By: Keith Rake M.D.   On: 05/12/2020 20:35        Scheduled Meds: . aspirin EC  81 mg Oral Daily  . escitalopram  20 mg Oral Daily  . heparin  5,000 Units Subcutaneous Q8H  . mirabegron ER  25 mg Oral Daily   Continuous Infusions: . sodium chloride 100 mL/hr at 05/13/20 2026      LOS: 1 day    Time spent: 34 minutes spent on chart review, discussion with nursing staff, consultants, updating family and interview/physical exam; more than 50% of that time was spent in counseling and/or coordination of care.    Hollee Fate J [place] DO [facility] Available via Epic secure chat 7am-7pm After these hours, please refer to coverage provider listed on [email or web address] 05/14/2020, 11:46 AM

## 2020-05-15 LAB — BASIC METABOLIC PANEL
Anion gap: 8 (ref 5–15)
BUN: 11 mg/dL (ref 8–23)
CO2: 22 mmol/L (ref 22–32)
Calcium: 8.7 mg/dL — ABNORMAL LOW (ref 8.9–10.3)
Chloride: 110 mmol/L (ref 98–111)
Creatinine, Ser: 0.7 mg/dL (ref 0.44–1.00)
GFR calc Af Amer: >60 mL/min (ref >60)
GFR calc non Af Amer: >60 mL/min (ref >60)
Glucose, Bld: 106 mg/dL — ABNORMAL HIGH (ref 70–99)
Potassium: 3.1 mmol/L — ABNORMAL LOW (ref 3.5–5.1)
Sodium: 140 mmol/L (ref 135–145)

## 2020-05-15 LAB — MAGNESIUM: Magnesium: 1.6 mg/dL — ABNORMAL LOW (ref 1.7–2.4)

## 2020-05-15 MED ORDER — POTASSIUM CHLORIDE CRYS ER 20 MEQ PO TBCR
30.0000 meq | EXTENDED_RELEASE_TABLET | ORAL | Status: AC
  Administered 2020-05-15 (×3): 30 meq via ORAL
  Filled 2020-05-15 (×3): qty 1

## 2020-05-15 MED ORDER — MAGNESIUM SULFATE 2 GM/50ML IV SOLN
2.0000 g | INTRAVENOUS | Status: AC
  Administered 2020-05-15 (×2): 2 g via INTRAVENOUS
  Filled 2020-05-15 (×2): qty 50

## 2020-05-15 NOTE — Evaluation (Signed)
Occupational Therapy Evaluation Patient Details Name: Angelica Bell MRN: [id number] DOB: 13-Apr-1932 Today's Date: 05/15/2020    History of Present Illness Pt is an 86 yo female presenting after being found down at her home with low RR and SpO2 after a fall while transfering from her WC to bed. The pt also reports generally worsening debility for several months. PMH includes: chronic pain on daily opiods, osteoporosis, CVA, HTN, and frequent falls.   Clinical Impression   PTA, pt was living at home alone, pt reports she was independent with ADL/IADL and modified independent with functional mobility with use of RW. However, pt's chart states that pt has been w/c bound. Pt currently demonstrates cognitive limitations and physical limitations impacting safety and independence with ADL/IADL and functional mobiltiy. She requires minA for toilet transfers and requires cues for sequencing and safe use of DME.  Due to decline in current level of function, pt would benefit from acute OT to address established goals to facilitate safe D/C to venue listed below. At this time, recommend SNF follow-up. Will continue to follow acutely.     Follow Up Recommendations  SNF;Supervision/Assistance - 24 hour    Equipment Recommendations  3 in 1 bedside commode    Recommendations for Other Services       Precautions / Restrictions Precautions Precautions: Fall Restrictions Weight Bearing Restrictions: No      Mobility Bed Mobility Overal bed mobility: Needs Assistance Bed Mobility: Supine to Sit;Sit to Supine     Supine to sit: Min assist Sit to supine: Min assist   General bed mobility comments: minA for BLE and trunk management  Transfers Overall transfer level: Needs assistance Equipment used: Rolling walker (2 wheeled) Transfers: Sit to/from [facility] Sit to Stand: Min assist Stand pivot transfers: Min assist       General transfer comment: minA for stability and  powerup into standing, cues for safe use of RW     Balance Overall balance assessment: Needs assistance Sitting-balance support: No upper extremity supported;Feet supported Sitting balance-Leahy Scale: Fair Sitting balance - Comments: able to reach down to BLE   Standing balance support: Single extremity supported Standing balance-Leahy Scale: Poor Standing balance comment: able to complete pericare while standing with single UE support                           ADL either performed or assessed with clinical judgement   ADL Overall ADL's : Needs assistance/impaired Eating/Feeding: Set up;Sitting   Grooming: Set up;Sitting   Upper Body Bathing: Min guard;Sitting   Lower Body Bathing: Minimal assistance;Sit to/from stand   Upper Body Dressing : Min guard;Sitting   Lower Body Dressing: Minimal assistance;Sit to/from stand   Toilet Transfer: Minimal Scientist, forensic Details (indicate cue type and reason): cues for safe use of RW; pt pivoted to [facility] to urinate Toileting- Clothing Manipulation and Hygiene: Minimal assistance;Sit to/from stand Toileting - Clothing Manipulation Details (indicate cue type and reason): pt completed pericare in standing     Functional mobility during ADLs: Minimal assistance;Rolling walker General ADL Comments: pt limited by back pain, cognitive limitations and decreased activity tolerance     Vision         Perception     Praxis      Pertinent Vitals/Pain Pain Assessment: Faces Faces Pain Scale: Hurts whole lot Pain Location: back Pain Descriptors / Indicators: Grimacing;Sore Pain Intervention(s): Limited activity within patient's tolerance;Monitored during session;Repositioned  Hand Dominance Right   Extremity/Trunk Assessment Upper Extremity Assessment Upper Extremity Assessment: Generalized weakness   Lower Extremity Assessment Lower Extremity Assessment: Generalized weakness   Cervical /  Trunk Assessment Cervical / Trunk Assessment: Kyphotic   Communication Communication Communication: HOH   Cognition Arousal/Alertness: Awake/alert Behavior During Therapy: Restless Overall Cognitive Status: No family/caregiver present to determine baseline cognitive functioning Area of Impairment: Attention;Memory;Safety/judgement;Awareness;Problem solving                   Current Attention Level: Focused Memory: Decreased recall of precautions;Decreased short-term memory   Safety/Judgement: Decreased awareness of safety;Decreased awareness of deficits Awareness: Intellectual Problem Solving: Difficulty sequencing;Requires verbal cues General Comments: pt with decreased insight to her deficits, pt highly distracted by pain, unsure reliability of information provided, pt requiring max cues for sequencing of tasks   General Comments  vss    Exercises     Shoulder Instructions      Home Living Family/patient expects to be discharged to:: Private residence Living Arrangements: Alone Available Help at Discharge: Family;Available PRN/intermittently Type of Home: House Home Access: Stairs to enter [employer] of Steps: 1 Entrance Stairs-Rails: None Home Layout: One level     Bathroom Shower/Tub: Occupational psychologist: Handicapped height     Home Equipment: None   Additional Comments: some info from previous admission, some from pt, unsure of reliability of pt      Prior Functioning/Environment Level of Independence: Needs assistance  Gait / Transfers Assistance Needed: pt reports use of RW however, per chart, non ambulatory per pt, uses w/c in the house, transfers (?squat pivot) independently ADL's / Homemaking Assistance Needed: per chart grandson brings take out when he visits morning and night, pt does bathe and dress independently   Comments: per chart, pt was using WC for mobility around home, independent ADLs, limited IADLs          OT Problem List: Decreased strength;Decreased range of motion;Decreased activity tolerance;Impaired balance (sitting and/or standing);Decreased knowledge of use of DME or AE;Decreased cognition;Decreased safety awareness;Pain      OT Treatment/Interventions: Self-care/ADL training;Therapeutic exercise;Energy conservation;DME and/or AE instruction;Therapeutic activities;Cognitive remediation/compensation;Patient/family education;Balance training    OT Goals(Current goals can be found in the care plan section) Acute Rehab OT Goals Patient Stated Goal: to not be in pain OT Goal Formulation: With patient Time For Goal Achievement: 05/29/20 Potential to Achieve Goals: Good ADL Goals Pt Will Perform Grooming: with modified independence;standing Pt Will Perform Upper Body Dressing: with modified independence;standing Pt Will Perform Lower Body Dressing: with modified independence;sit to/from stand Pt Will Transfer to Toilet: with modified independence;ambulating Additional ADL Goal #1: Pt will demonstrate anticipatory awareness for safe engagement in ADL/IADL. Additional ADL Goal #2: Pt will demonstrate modified independence with medication management.  OT Frequency: Min 2X/week   Barriers to D/C: Decreased caregiver support  pt lives alone       Co-evaluation              AM-PAC OT "6 Clicks" Daily Activity     Outcome Measure Help from another person eating meals?: A Little Help from another person taking care of personal grooming?: A Little Help from another person toileting, which includes using toliet, bedpan, or urinal?: A Little Help from another person bathing (including washing, rinsing, drying)?: A Little Help from another person to put on and taking off regular upper body clothing?: A Little Help from another person to put on and taking off regular lower body clothing?: A Little  6 Click Score: 18   End of Session Equipment Utilized During Treatment: Gait belt;Rolling  walker Nurse Communication: Mobility status(back pain)  Activity Tolerance: Patient tolerated treatment well;Patient limited by pain Patient left: in bed;with call bell/phone within reach;with bed alarm set;with nursing/sitter in room  OT Visit Diagnosis: Unsteadiness on feet (R26.81);Other abnormalities of gait and mobility (R26.89);Muscle weakness (generalized) (M62.81);Other symptoms and signs involving cognitive function;Pain Pain - part of body: (back)                Time: [id number] OT Time Calculation (min): 18 min Charges:  OT General Charges $OT Visit: 1 Visit OT Evaluation $OT Eval Moderate Complexity: [place] OTR/L Acute Rehabilitation Services Office: [place] 05/15/2020, 1:17 PM

## 2020-05-15 NOTE — Progress Notes (Addendum)
PROGRESS NOTE    Angelica Bell  [id number] DOB: November 25, 1932 DOA: 05/12/2020 PCP: Heywood Bene, PA-C    Brief Narrative:  Angelica Bell is a 86 year old Caucasian female with past medical history remarkable for chronic pain on opioid treatment managed by PMNR, osteoporosis, CVA, essential hypertension who presented with fall at home.  Patient has had progressive worsening debility over the past few months, now appears to be wheelchair-bound.  She reported that she was transferring from her wheelchair to the bed in which she failed, denying hitting her head.  She was confused and her grandson [name] who checks on her found her on the floor at home for on known amount of time.  Per EMS report, she was found to be breathing 4 times a minute with SPO2 of 38%.  She was given Narcan with improvement of her mental status and she was noted to be hypoxic in the mid 80s on arrival to the ED.  Currently complaining of low back pain which she says is chronic. She thinks she may have taken too much oxycodone. She is unable to tell me exactly how much she normally takes. Denies other drug use.   In the ED, patient was afebrile with WBC count 11.7, hemoglobin 10.9.  Sodium 135, potassium 4.3, creatinine 1.59.  UDS positive for opiates.  CT abdomen/pelvis with no acute intra-abdominal findings.  EtOH level less than 10.  Covid-19 PCR negative.  Urinalysis unrevealing.  CT head/C-spine with no acute intracranial abnormalities but does note chronic atrophic/ischemic changes and no fracture or subluxation in regards to the C-spine.  Etiology of her presenting symptoms likely secondary to unintentional narcotics overdose.  TRH was consulted for further evaluation and management.   Assessment & Plan:   Active Problems:   ANXIETY DEPRESSION   Chronic pain   AKI (acute kidney injury) ([place])   Opioid overdose ([place])   Unintentional opioid overdose Acute respiratory failure with hypoxia Patient  with history of chronic pain on oxycodone 30 mg 3 times daily as needed, managed by PM&R Dr. Nelva Bush.  Patient has had a significant decline in her mobility, and unable to recall her home medication dosing.  Patient was found down at home hypoxic with decreased respiratory rate with rapid recovery following Narcan administration.  She remained mildly hypoxic in the ED.  UDS unrevealing other than opioids which she is prescribed, CT head and C-spine unrevealing and unlikely infectious process with negative Covid, urinalysis. --Resume oxycodone at a lower dose 5 mg q8h prn to avoid opiate withdrawals --Continue supplemental oxygen, titrate to maintain SPO2 greater than 92%  Weakness, debility Patient reports progressive loss of ambulatory status, now utilizes wheelchair much of the day.  Patient currently lives alone, her grandson checks on her usually once daily.  Concerned that patient has inability to manage herself at home in terms of her ADLs. --PT recommends SNF placement, patient refuses and plans to return home --Awaiting OT evaluation  Acute kidney injury Etiology likely prerenal from prolonged downtime.  Received 500 cc bolus in ED.  CT abdomen/pelvis with no acute intra-abdominal pathology. --Cr 1.59>0.83>0.70 --Discontinue IV fluids today --Repeat BMP in a.m.  Normocytic anemia Hemoglobin 10.9, baseline  Depression: Continue home escitalopram  Overactive bladder: Continue home Myrbetriq  Chronic pain syndrome On oxycodone 30 mg p.o. 3 times daily prn and gabapentin at home.  Follows with physical medicine and rehabilitation, Dr. Nelva Bush. --Continue gabapentin --Resume oxycodone at a lower dose; 5 mg every 8 hours as needed  Essential  hypertension BP 153/80, on amlodipine at home. --Hold home amlodipine  --Continue aspirin   Hypokalemia Potassium 3.1, will replete. --Repeat potassium in the a.m.  Hypomagnesemia Magnesium 1.6, will replete --Repeat magnesium level in the  a.m.  DVT prophylaxis: Heparin Code Status: Full code Family Communication: No family present at bedside, updated patient's grandson [name] who is the [facility] via telephone this afternoon  Disposition Plan:  Status is: Inpatient  Remains inpatient appropriate because:Unsafe d/c plan still awaiting OT evaluation.   Dispo: The patient is from: Home              Anticipated d/c is to: To be determined likely home with home health as she refuses SNF              Anticipated d/c date is: 1 day              Patient currently is not medically stable to d/c.   Consultants:   None  Procedures:   None  Antimicrobials:   None   Subjective: Patient seen and examined bedside, resting comfortably.  No complaints this morning.  Discussed with patient recommendations from physical therapy with SNF placement, patient adamantly refuses and wishes to return home.  Still awaiting OT evaluation. No other specific complaints or concerns at this time.  Denies headache, no fever/chills/night sweats, no nausea/vomiting/diarrhea, no chest pain, no palpitations, no shortness of breath, no abdominal pain.  No acute events overnight per nursing staff.  Objective: Vitals:   05/14/20 1509 05/14/20 1927 05/15/20 0259 05/15/20 0746  BP: (!) 149/58 129/72 (!) 153/80 (!) 156/57  Pulse: 70 71 76 83  Resp: 18 15 15 16   Temp: 98.4 F (36.9 C) 98.7 F (37.1 C) 98.1 F (36.7 C) 97.6 F (36.4 C)  TempSrc: Oral Oral Oral Oral  SpO2: 95% 93% 94% 98%  Weight:      Height:        Intake/Output Summary (Last 24 hours) at 05/15/2020 1232 Last data filed at 05/15/2020 0250 Gross per 24 hour  Intake 240 ml  Output 1300 ml  Net -1060 ml   Filed Weights   05/12/20 2005  Weight: 44 kg    Examination:  General exam: Appears calm and comfortable  Respiratory system: Clear to auscultation. Respiratory effort normal. Oxygenating well on room air Cardiovascular system: S1 & S2 heard, RRR. No JVD, murmurs, rubs,  gallops or clicks. No pedal edema. Gastrointestinal system: Abdomen is nondistended, soft and nontender. No organomegaly or masses felt. Normal bowel sounds heard. Central nervous system: Alert and oriented. No focal neurological deficits. Extremities: Symmetric 5 x 5 power. Skin: No rashes, lesions or ulcers Psychiatry: Judgement and insight appear poor. Mood & affect appropriate.     Data Reviewed: I have personally reviewed following labs and imaging studies  CBC: Recent Labs  Lab 05/12/20 2103  WBC 11.7*  NEUTROABS 9.3*  HGB 10.9*  HCT 35.7*  MCV 91.8  PLT 0000000   Basic Metabolic Panel: Recent Labs  Lab 05/12/20 2103 05/14/20 0235 05/15/20 0718  NA 135 140 140  K 4.3 4.0 3.1*  CL 102 112* 110  CO2 25 24 22   GLUCOSE 126* 109* 106*  BUN 21 15 11   CREATININE 1.59* 0.83 0.70  CALCIUM 9.1 9.1 8.7*  MG  --  1.6* 1.6*   GFR: Estimated Creatinine Clearance: 35.1 mL/min (by C-G formula based on SCr of 0.7 mg/dL). Liver Function Tests: Recent Labs  Lab 05/12/20 2103  AST 24  ALT 15  ALKPHOS 64  BILITOT 0.7  PROT 5.3*  ALBUMIN 3.2*   No results for input(s): LIPASE, AMYLASE in the last 168 hours. No results for input(s): AMMONIA in the last 168 hours. Coagulation Profile: No results for input(s): INR, PROTIME in the last 168 hours. Cardiac Enzymes: Recent Labs  Lab 05/12/20 2100  CKTOTAL 167   BNP (last 3 results) No results for input(s): PROBNP in the last 8760 hours. HbA1C: No results for input(s): HGBA1C in the last 72 hours. CBG: No results for input(s): GLUCAP in the last 168 hours. Lipid Profile: No results for input(s): CHOL, HDL, LDLCALC, TRIG, CHOLHDL, LDLDIRECT in the last 72 hours. Thyroid Function Tests: No results for input(s): TSH, T4TOTAL, FREET4, T3FREE, THYROIDAB in the last 72 hours. Anemia Panel: Recent Labs    05/13/20 0743  FERRITIN 43  TIBC 288  IRON 69   Sepsis Labs: Recent Labs  Lab 05/12/20 2103 05/12/20 2311   LATICACIDVEN 0.9 0.8    Recent Results (from the past 240 hour(s))  Culture, blood (routine x 2)     Status: None (Preliminary result)   Collection Time: 05/12/20  9:04 PM   Specimen: BLOOD  Result Value Ref Range Status   Specimen Description BLOOD SITE NOT SPECIFIED  Final   Special Requests   Final    BOTTLES DRAWN AEROBIC AND ANAEROBIC Blood Culture results may not be optimal due to an inadequate volume of blood received in culture bottles   Culture   Final    NO GROWTH 3 DAYS Performed at [facility], [place] [address] [place], [place] [address]    Report Status PENDING  Incomplete  SARS Coronavirus 2 by RT PCR (hospital order, performed in [facility] hospital lab) Nasopharyngeal Nasopharyngeal Swab     Status: None   Collection Time: 05/12/20 10:18 PM   Specimen: Nasopharyngeal Swab  Result Value Ref Range Status   SARS Coronavirus 2 NEGATIVE NEGATIVE Final    Comment: (NOTE) SARS-CoV-2 target nucleic acids are NOT DETECTED. The SARS-CoV-2 RNA is generally detectable in upper and lower respiratory specimens during the acute phase of infection. The lowest concentration of SARS-CoV-2 viral copies this assay can detect is 250 copies / mL. A negative result does not preclude SARS-CoV-2 infection and should not be used as the sole basis for treatment or other patient management decisions.  A negative result may occur with improper specimen collection / handling, submission of specimen other than nasopharyngeal swab, presence of viral mutation(s) within the areas targeted by this assay, and inadequate number of viral copies (<250 copies / mL). A negative result must be combined with clinical observations, patient history, and epidemiological information. Fact Sheet for Patients:   StrictlyIdeas.no Fact Sheet for Healthcare Providers: BankingDealers.co.za This test is not yet approved or cleared  by the [place] FDA and  has been authorized for detection and/or diagnosis of SARS-CoV-2 by FDA under an Emergency Use Authorization (EUA).  This EUA will remain in effect (meaning this test can be used) for the duration of the COVID-19 declaration under Section 564(b)(1) of the Act, 21 U.S.C. section 360bbb-3(b)(1), unless the authorization is terminated or revoked sooner. Performed at [facility], [place] [address], [place], [place] [address]   Culture, blood (routine x 2)     Status: None (Preliminary result)   Collection Time: 05/12/20 11:04 PM   Specimen: BLOOD  Result Value Ref Range Status   Specimen Description BLOOD RIGHT ANTECUBITAL  Final   Special Requests  Final    BOTTLES DRAWN AEROBIC AND ANAEROBIC Blood Culture adequate volume   Culture   Final    NO GROWTH 3 DAYS Performed at [facility], [place] [address], [place], [place] [address]    Report Status PENDING  Incomplete         Radiology Studies: No results found.      Scheduled Meds: . aspirin EC  81 mg Oral Daily  . escitalopram  20 mg Oral Daily  . heparin  5,000 Units Subcutaneous Q8H  . mirabegron ER  25 mg Oral Daily  . potassium chloride  30 mEq Oral Q3H   Continuous Infusions:    LOS: 2 days    Time spent: 34 minutes spent on chart review, discussion with nursing staff, consultants, updating family and interview/physical exam; more than 50% of that time was spent in counseling and/or coordination of care.    Eric J [place] DO [facility] Available via Epic secure chat 7am-7pm After these hours, please refer to coverage provider listed on [email or web address] 05/15/2020, 12:32 PM
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
## 2020-05-15 NOTE — Plan of Care (Signed)

## 2020-05-16 LAB — BASIC METABOLIC PANEL
Anion gap: 12 (ref 5–15)
BUN: 9 mg/dL (ref 8–23)
CO2: 20 mmol/L — ABNORMAL LOW (ref 22–32)
Calcium: 9 mg/dL (ref 8.9–10.3)
Chloride: 106 mmol/L (ref 98–111)
Creatinine, Ser: 0.73 mg/dL (ref 0.44–1.00)
GFR calc Af Amer: >60 mL/min (ref >60)
GFR calc non Af Amer: >60 mL/min (ref >60)
Glucose, Bld: 91 mg/dL (ref 70–99)
Potassium: 4.1 mmol/L (ref 3.5–5.1)
Sodium: 138 mmol/L (ref 135–145)

## 2020-05-16 LAB — MAGNESIUM: Magnesium: 2.2 mg/dL (ref 1.7–2.4)

## 2020-05-16 MED ORDER — AMLODIPINE BESYLATE 2.5 MG PO TABS
2.5000 mg | ORAL_TABLET | Freq: Every day | ORAL | Status: DC
  Administered 2020-05-16 – 2020-05-22 (×7): 2.5 mg via ORAL
  Filled 2020-05-16 (×6): qty 1

## 2020-05-16 NOTE — Progress Notes (Signed)
PROGRESS NOTE    Angelica Bell  [id number] DOB: May 28, 1932 DOA: 05/12/2020 PCP: Heywood Bene, PA-C    Brief Narrative:  Angelica Bell is a 86 year old Caucasian female with past medical history remarkable for chronic pain on opioid treatment managed by PMNR, osteoporosis, CVA, essential hypertension who presented with fall at home.  Patient has had progressive worsening debility over the past few months, now appears to be wheelchair-bound.  She reported that she was transferring from her wheelchair to the bed in which she failed, denying hitting her head.  She was confused and her grandson [name] who checks on her found her on the floor at home for on known amount of time.  Per EMS report, she was found to be breathing 4 times a minute with SPO2 of 38%.  She was given Narcan with improvement of her mental status and she was noted to be hypoxic in the mid 80s on arrival to the ED.  Currently complaining of low back pain which she says is chronic. She thinks she may have taken too much oxycodone. She is unable to tell me exactly how much she normally takes. Denies other drug use.   In the ED, patient was afebrile with WBC count 11.7, hemoglobin 10.9.  Sodium 135, potassium 4.3, creatinine 1.59.  UDS positive for opiates.  CT abdomen/pelvis with no acute intra-abdominal findings.  EtOH level less than 10.  Covid-19 PCR negative.  Urinalysis unrevealing.  CT head/C-spine with no acute intracranial abnormalities but does note chronic atrophic/ischemic changes and no fracture or subluxation in regards to the C-spine.  Etiology of her presenting symptoms likely secondary to unintentional narcotics overdose.  TRH was consulted for further evaluation and management.   Assessment & Plan:   Active Problems:   ANXIETY DEPRESSION   Chronic pain   AKI (acute kidney injury) ([place])   Opioid overdose ([place])   Unintentional opioid overdose Acute respiratory failure with hypoxia Patient  with history of chronic pain on oxycodone 30 mg 3 times daily as needed, managed by PM&R Dr. Nelva Bush.  Patient has had a significant decline in her mobility, and unable to recall her home medication dosing.  Patient was found down at home hypoxic with decreased respiratory rate with rapid recovery following Narcan administration.  She remained mildly hypoxic in the ED.  UDS unrevealing other than opioids which she is prescribed, CT head and C-spine unrevealing and unlikely infectious process with negative Covid, urinalysis. --Resume oxycodone at a lower dose 5 mg q8h prn to avoid opiate withdrawals --Continue supplemental oxygen, titrate to maintain SPO2 greater than 92%  Weakness, debility Patient reports progressive loss of ambulatory status, now utilizes wheelchair much of the day.  Patient currently lives alone, her grandson checks on her usually once daily.  Concerned that patient has inability to manage herself at home in terms of her ADLs. --PT/OT recommends SNF placement, patient refuses and plans to return home  Acute kidney injury Etiology likely prerenal from prolonged downtime.  Received 500 cc bolus in ED.  CT abdomen/pelvis with no acute intra-abdominal pathology. --Cr 1.59>0.83>0.70 --Discontinue IV fluids today --Repeat BMP in a.m.  Normocytic anemia Hemoglobin 10.9, baseline  Depression: Continue home escitalopram  Overactive bladder: Continue home Myrbetriq  Chronic pain syndrome On oxycodone 30 mg p.o. 3 times daily prn and gabapentin at home.  Follows with physical medicine and rehabilitation, Dr. Nelva Bush. --Continue gabapentin --Resume oxycodone at a lower dose; 5 mg every 8 hours as needed  Essential hypertension BP 149/95  this morning, on amlodipine at home. --Will restart home amlodipine at lower dose of 2.5 mg p.o. daily --Continue to monitor blood pressure closely --Continue aspirin   Hypokalemia Repleted during hospitalization, potassium 4.1 today. --Repeat  potassium in the a.m.  Hypomagnesemia Repleted during hospitalization, magnesium 2.2 today --Repeat magnesium level in the a.m.  DVT prophylaxis: Heparin Code Status: Full code Family Communication: No family present at bedside, updated patient's grandson [name] who is the [facility] via telephone and he states unable to keep on caring for her and no other family available for assistance.  Disposition Plan:  Status is: Inpatient  Remains inpatient appropriate because:Unsafe d/c plan, awaiting psychiatry evaluation for capacity assessment for medical decision making   Dispo: The patient is from: Home              Anticipated d/c is to: To be determined likely home with home health as she refuses SNF              Anticipated d/c date is: 1 day              Patient currently is not medically stable to d/c.   Consultants:   Psychiatry  Procedures:   None  Antimicrobials:   None   Subjective: Patient seen and examined bedside, resting comfortably.  No complaints this morning other than her chronic pain. Discussed extensively with patient's healthcare power of attorney, grandson who states is becoming unable to care for her anymore no other family member available for help, she essentially now wheelchair-bound and there is high concerns for her to be able to perform activities of daily living. Patient still wishes to return home and refusing SNF placement. No other specific complaints or concerns at this time.  Denies headache, no fever/chills/night sweats, no nausea/vomiting/diarrhea, no chest pain, no palpitations, no shortness of breath, no abdominal pain.  No acute events overnight per nursing staff.  Objective: Vitals:   05/15/20 1500 05/15/20 1918 05/16/20 0324 05/16/20 0802  BP: (!) 164/94 (!) 149/89 (!) 149/95 (!) 166/95  Pulse: 81 75 80 77  Resp: 17 19 18 18   Temp: 99 F (37.2 C) 98.2 F (36.8 C) 98.1 F (36.7 C) 98.4 F (36.9 C)  TempSrc: Oral Oral Oral Oral  SpO2: 98%  96% 96% 96%  Weight:      Height:        Intake/Output Summary (Last 24 hours) at 05/16/2020 1013 Last data filed at 05/16/2020 0857 Gross per 24 hour  Intake 360 ml  Output 750 ml  Net -390 ml   Filed Weights   05/12/20 2005  Weight: 44 kg    Examination:  General exam: Appears calm and comfortable  Respiratory system: Clear to auscultation. Respiratory effort normal. Oxygenating well on room air Cardiovascular system: S1 & S2 heard, RRR. No JVD, murmurs, rubs, gallops or clicks. No pedal edema. Gastrointestinal system: Abdomen is nondistended, soft and nontender. No organomegaly or masses felt. Normal bowel sounds heard. Central nervous system: Alert and oriented. No focal neurological deficits. Extremities: Symmetric 5 x 5 power. Skin: No rashes, lesions or ulcers Psychiatry: Judgement and insight appear poor. Mood & affect appropriate.     Data Reviewed: I have personally reviewed following labs and imaging studies  CBC: Recent Labs  Lab 05/12/20 2103  WBC 11.7*  NEUTROABS 9.3*  HGB 10.9*  HCT 35.7*  MCV 91.8  PLT 0000000   Basic Metabolic Panel: Recent Labs  Lab 05/12/20 2103 05/14/20 0235 05/15/20 0718 05/16/20 0550  NA 135 140 140 138  K 4.3 4.0 3.1* 4.1  CL 102 112* 110 106  CO2 25 24 22  20*  GLUCOSE 126* 109* 106* 91  BUN 21 15 11 9   CREATININE 1.59* 0.83 0.70 0.73  CALCIUM 9.1 9.1 8.7* 9.0  MG  --  1.6* 1.6* 2.2   GFR: Estimated Creatinine Clearance: 35.1 mL/min (by C-G formula based on SCr of 0.73 mg/dL). Liver Function Tests: Recent Labs  Lab 05/12/20 2103  AST 24  ALT 15  ALKPHOS 64  BILITOT 0.7  PROT 5.3*  ALBUMIN 3.2*   No results for input(s): LIPASE, AMYLASE in the last 168 hours. No results for input(s): AMMONIA in the last 168 hours. Coagulation Profile: No results for input(s): INR, PROTIME in the last 168 hours. Cardiac Enzymes: Recent Labs  Lab 05/12/20 2100  CKTOTAL 167   BNP (last 3 results) No results for input(s):  PROBNP in the last 8760 hours. HbA1C: No results for input(s): HGBA1C in the last 72 hours. CBG: No results for input(s): GLUCAP in the last 168 hours. Lipid Profile: No results for input(s): CHOL, HDL, LDLCALC, TRIG, CHOLHDL, LDLDIRECT in the last 72 hours. Thyroid Function Tests: No results for input(s): TSH, T4TOTAL, FREET4, T3FREE, THYROIDAB in the last 72 hours. Anemia Panel: No results for input(s): VITAMINB12, FOLATE, FERRITIN, TIBC, IRON, RETICCTPCT in the last 72 hours. Sepsis Labs: Recent Labs  Lab 05/12/20 2103 05/12/20 2311  LATICACIDVEN 0.9 0.8    Recent Results (from the past 240 hour(s))  Culture, blood (routine x 2)     Status: None (Preliminary result)   Collection Time: 05/12/20  9:04 PM   Specimen: BLOOD  Result Value Ref Range Status   Specimen Description BLOOD SITE NOT SPECIFIED  Final   Special Requests   Final    BOTTLES DRAWN AEROBIC AND ANAEROBIC Blood Culture results may not be optimal due to an inadequate volume of blood received in culture bottles   Culture   Final    NO GROWTH 4 DAYS Performed at [facility], [place] [address], [place], [place] [address]    Report Status PENDING  Incomplete  SARS Coronavirus 2 by RT PCR (hospital order, performed in [facility] hospital lab) Nasopharyngeal Nasopharyngeal Swab     Status: None   Collection Time: 05/12/20 10:18 PM   Specimen: Nasopharyngeal Swab  Result Value Ref Range Status   SARS Coronavirus 2 NEGATIVE NEGATIVE Final    Comment: (NOTE) SARS-CoV-2 target nucleic acids are NOT DETECTED. The SARS-CoV-2 RNA is generally detectable in upper and lower respiratory specimens during the acute phase of infection. The lowest concentration of SARS-CoV-2 viral copies this assay can detect is 250 copies / mL. A negative result does not preclude SARS-CoV-2 infection and should not be used as the sole basis for treatment or other patient management decisions.  A negative result may occur with improper  specimen collection / handling, submission of specimen other than nasopharyngeal swab, presence of viral mutation(s) within the areas targeted by this assay, and inadequate number of viral copies (<250 copies / mL). A negative result must be combined with clinical observations, patient history, and epidemiological information. Fact Sheet for Patients:   StrictlyIdeas.no Fact Sheet for Healthcare Providers: BankingDealers.co.za This test is not yet approved or cleared  by the [place] FDA and has been authorized for detection and/or diagnosis of SARS-CoV-2 by FDA under an Emergency Use Authorization (EUA).  This EUA will remain in effect (meaning this test can  be used) for the duration of the COVID-19 declaration under Section 564(b)(1) of the Act, 21 U.S.C. section 360bbb-3(b)(1), unless the authorization is terminated or revoked sooner. Performed at [facility], [place] [address] [place], [place] [address]   Culture, blood (routine x 2)     Status: None (Preliminary result)   Collection Time: 05/12/20 11:04 PM   Specimen: BLOOD  Result Value Ref Range Status   Specimen Description BLOOD RIGHT ANTECUBITAL  Final   Special Requests   Final    BOTTLES DRAWN AEROBIC AND ANAEROBIC Blood Culture adequate volume   Culture   Final    NO GROWTH 4 DAYS Performed at [facility], [place] [address], [place], [place] [address]    Report Status PENDING  Incomplete         Radiology Studies: No results found.      Scheduled Meds: . aspirin EC  81 mg Oral Daily  . escitalopram  20 mg Oral Daily  . heparin  5,000 Units Subcutaneous Q8H  . mirabegron ER  25 mg Oral Daily   Continuous Infusions:    LOS: 3 days    Time spent: 34 minutes spent on chart review, discussion with nursing staff, consultants, updating family and interview/physical exam; more than 50% of that time was spent in counseling and/or coordination of  care.    Joshawa Dubin J [place] DO [facility] Available via Epic secure chat 7am-7pm After these hours, please refer to coverage provider listed on [email or web address] 05/16/2020, 10:13 AM

## 2020-05-16 NOTE — Consult Note (Signed)
[facility] Consult   Reason for Consult:  "[phone] with multiple hospitalizations, unable care for self per grandson who is HCPOA; please assess patients capacity for medical decision making" Referring Physician:  Dr. [place]  Patient Identification: Angelica Bell MRN:  [id number] Principal Diagnosis: <principal problem not specified> Diagnosis:  Active Problems:   ANXIETY DEPRESSION   Chronic pain   AKI (acute kidney injury) ([place])   Opioid overdose ([place])   Total Time spent with patient: 1 hour  Subjective:    HPI: Angelica Bell is a 86 y.o. female patient.  Patient assessed by nurse practitioner.  Patient alert and oriented to self.  Patient reports the current month and are May 2011.  Patient unable to recall events leading to hospitalization states "I do not know why but Angelica Bell called an ambulance." Patient pleasant and cooperative during assessment. Patient denies suicidal and homicidal ideations.  Patient denies history of self-harm.  Patient denies auditory visual hallucinations. Patient reports she lives alone in [place].  Patient is retired.  Patient denies access to weapons.  Patient reports her grandson, Angelica Bell does her grocery shopping and prepares most of her meals.  Patient reports she is unable to ambulate and uses a wheelchair. Patient reports home medications are "oxycodone, that is it." Patient states "I have my daughter and Angelica Bell but they want have anything to do with me and I do not want you to call them."  Patient verbalizes "my grandson comes to my house every morning and if he did not I would be really unhappy." Patient gives verbal consent to speak with her grandson, Angelica Bell.  Spoke with patient's grandson, Angelica Bell phone number [phone]. Patient's grandson reports he has been power of attorney "for a while."  Patient's grandson states "I know she needs help, we have been over this for 6 to 8 months and we started  looking into home health but she does not want anyone in her home." Patient's grandson states that he would like to have someone stay in the house with patient because he believes she is unable to care for herself when home alone.  Patient's grandson reports speaking with patient's primary doctor regarding her decline in independence see "year after year." Patient's grandson reports he visits patient twice daily in her home but he will be unable to continue these frequent visits moving forward.   Past Psychiatric History: Anxiety, depression  Risk to Self:  Risk to Others:   Prior Inpatient Therapy:   Prior Outpatient Therapy:    Past Medical History:  Past Medical History:  Diagnosis Date  . Chronic back pain    with degenerative joint disease  . Chronic pain disorder    with narcotic management  . Dry eyes   . Frequent falls   . HOH (hard of hearing)   . Hypertension   . Multiple rib fractures 02/2020  . Osteoporosis   . PE (pulmonary embolism)    After hysterectomy in 1967  . Pneumonia   . Rhabdomyolysis     Past Surgical History:  Procedure Laterality Date  . ABDOMINAL HYSTERECTOMY    . APPENDECTOMY    . BREAST ENHANCEMENT SURGERY    . CATARACT EXTRACTION    . FEMUR IM NAIL Left 02/17/2017   Procedure: INTRAMEDULLARY (IM) NAIL FEMORAL;  Surgeon: Nicholes Stairs, MD;  Location: [facility];  Service: Orthopedics;  Laterality: Left;  . HIP ARTHROPLASTY Right 12/24/2015   Procedure: ARTHROPLASTY BIPOLAR HIP (HEMIARTHROPLASTY);  Surgeon: Netta Cedars, MD;  Location: [facility];  Service: Orthopedics;  Laterality: Right;  . TONSILLECTOMY     Family History:  Family History  Problem Relation Age of Onset  . Heart Problems Mother    Family Psychiatric  History: None reported Social History:  Social History   Substance and Sexual Activity  Alcohol Use No     Social History   Substance and Sexual Activity  Drug Use No    Social History   Socioeconomic History  .  Marital status: Widowed    Spouse name: Not on file  . Number of children: 3  . Years of education: HS  . Highest education level: Not on file  Occupational History  . Occupation: Retired    Fish farm manager: RETIRED  Tobacco Use  . Smoking status: Former Smoker    Years: 13.00    Quit date: 04/15/1976    Years since quitting: 44.1  . Smokeless tobacco: Never Used  Substance and Sexual Activity  . Alcohol use: No  . Drug use: No  . Sexual activity: Not Currently  Other Topics Concern  . Not on file  Social History Narrative   Patient lives at home with her grandson.   Caffeine Use: none   Social Determinants of Health   Financial Resource Strain:   . Difficulty of Paying Living Expenses:   Food Insecurity:   . Worried About Charity fundraiser in the Last Year:   . Arboriculturist in the Last Year:   Transportation Needs:   . Film/video editor (Medical):   Marland Kitchen Lack of Transportation (Non-Medical):   Physical Activity:   . Days of Exercise per Week:   . Minutes of Exercise per Session:   Stress:   . Feeling of Stress :   Social Connections:   . Frequency of Communication with Friends and Family:   . Frequency of Social Gatherings with Friends and Family:   . Attends Religious Services:   . Active Member of Clubs or Organizations:   . Attends Archivist Meetings:   Marland Kitchen Marital Status:    Additional Social History:    Allergies:   Allergies  Allergen Reactions  . Hctz [Hydrochlorothiazide]     Significant and rapid hyponatremia (TIH)  . Amoxicillin-Pot Clavulanate Hives and Diarrhea    Has patient had a PCN reaction causing immediate rash, facial/tongue/throat swelling, SOB or lightheadedness with hypotension: yes Has patient had a PCN reaction causing severe rash involving mucus membranes or skin necrosis: yes Has patient had a PCN reaction that required hospitalization : unknown Has patient had a PCN reaction occurring within the last 10 years: unknown If  all of the above answers are "NO", then may proceed with Cephalosporin use.   Marland Kitchen Lisinopril-Hydrochlorothiazide Other (See Comments)    Tired- no energy to do anything..  . Sulfa Antibiotics Hives and Nausea And Vomiting  . Sulfonamide Derivatives Hives    Labs:  Results for orders placed or performed during the hospital encounter of 05/12/20 (from the past 48 hour(s))  Basic metabolic panel     Status: Abnormal   Collection Time: 05/15/20  7:18 AM  Result Value Ref Range   Sodium 140 135 - 145 mmol/L   Potassium 3.1 (L) 3.5 - 5.1 mmol/L   Chloride 110 98 - 111 mmol/L   CO2 22 22 - 32 mmol/L   Glucose, Bld 106 (H) 70 - 99 mg/dL    Comment: Glucose reference range applies only to  samples taken after fasting for at least 8 hours.   BUN 11 8 - 23 mg/dL   Creatinine, Ser 0.70 0.44 - 1.00 mg/dL   Calcium 8.7 (L) 8.9 - 10.3 mg/dL   GFR calc non Af Amer >60 >60 mL/min   GFR calc Af Amer >60 >60 mL/min   Anion gap 8 5 - 15    Comment: Performed at [facility] [address], [place], [place] [address]  Magnesium     Status: Abnormal   Collection Time: 05/15/20  7:18 AM  Result Value Ref Range   Magnesium 1.6 (L) 1.7 - 2.4 mg/dL    Comment: Performed at [facility] [address] [place], [place] [id number]  Basic metabolic panel     Status: Abnormal   Collection Time: 05/16/20  5:50 AM  Result Value Ref Range   Sodium 138 135 - 145 mmol/L   Potassium 4.1 3.5 - 5.1 mmol/L   Chloride 106 98 - 111 mmol/L   CO2 20 (L) 22 - 32 mmol/L   Glucose, Bld 91 70 - 99 mg/dL    Comment: Glucose reference range applies only to samples taken after fasting for at least 8 hours.   BUN 9 8 - 23 mg/dL   Creatinine, Ser 0.73 0.44 - 1.00 mg/dL   Calcium 9.0 8.9 - 10.3 mg/dL   GFR calc non Af Amer >60 >60 mL/min   GFR calc Af Amer >60 >60 mL/min   Anion gap 12 5 - 15    Comment: Performed at [facility] [address] [place], [place] [address]  Magnesium     Status: None    Collection Time: 05/16/20  5:50 AM  Result Value Ref Range   Magnesium 2.2 1.7 - 2.4 mg/dL    Comment: Performed at [facility] [address] [place], [place] [address]    Current Facility-Administered Medications  Medication Dose Route Frequency Provider Last Rate Last Admin  . amLODipine (NORVASC) tablet 2.5 mg  2.5 mg Oral Daily [place] Donnamarie Poag, DO   2.5 mg at 05/16/20 1000  . aspirin EC tablet 81 mg  81 mg Oral Daily Gwynne Edinger, MD   81 mg at 05/16/20 [id number]  . escitalopram (LEXAPRO) tablet 20 mg  20 mg Oral Daily Gwynne Edinger, MD   20 mg at 05/16/20 0825  . gabapentin (NEURONTIN) capsule 100 mg  100 mg Oral PRN Gwynne Edinger, MD   100 mg at 05/16/20 1518  . heparin injection 5,000 Units  5,000 Units Subcutaneous Q8H Wouk, Ailene Rud, MD   5,000 Units at 05/16/20 1500  . mirabegron ER (MYRBETRIQ) tablet 25 mg  25 mg Oral Daily Gwynne Edinger, MD   25 mg at 05/16/20 0825  . oxyCODONE (Oxy IR/ROXICODONE) immediate release tablet 5 mg  5 mg Oral Q8H PRN [place] Eric J, DO   5 mg at 05/16/20 1517    Musculoskeletal: Strength & Muscle Tone: decreased Gait & Station: unable to assess Patient leans: N/A  Psychiatric Specialty Exam: Physical Exam  Nursing note and vitals reviewed. Constitutional: She appears well-developed.  HENT:  Head: Normocephalic.  Cardiovascular: Normal rate.  Respiratory: Effort normal.  Musculoskeletal:     Cervical back: Normal range of motion.  Neurological: She is alert.  Psychiatric: She has a normal mood and affect. Her speech is normal and behavior is normal. Thought content normal. Cognition and memory are impaired. She expresses inappropriate judgment.  She exhibits abnormal recent memory.    Review of Systems  Constitutional: Negative.   HENT: Negative.   Eyes: Negative.   Respiratory: Negative.   Cardiovascular: Negative.   Gastrointestinal: Negative.   Genitourinary: Negative.   Musculoskeletal: Negative.    Skin: Negative.   Neurological: Negative.   Psychiatric/Behavioral: Positive for confusion.    Blood pressure (!) 164/82, pulse 82, temperature 98.5 F (36.9 C), temperature source Oral, resp. rate 18, height 5' (1.524 m), weight 44 kg, SpO2 97 %.Body mass index is 18.94 kg/m.  General Appearance: Casual and Fairly Groomed  Eye Contact:  Fair  Speech:  Clear and Coherent and Normal Rate  Volume:  Normal  Mood:  Euthymic  Affect:  Congruent  Thought Process:  Coherent, Goal Directed and Descriptions of Associations: Intact  Orientation:  Other:  Oriented to self, partially to time  Thought Content:  Logical  Suicidal Thoughts:  No  Homicidal Thoughts:  No  Memory:  Immediate;   Fair Recent;   Poor  Judgement:  Impaired  Insight:  Lacking  Psychomotor Activity:  Normal  Concentration:  Concentration: Fair  Recall:  [employer] of Knowledge:  Fair  Language:  Fair  Akathisia:  No  Handed:  Right  AIMS (if indicated):     Assets:  Communication Skills Desire for Improvement Financial Resources/Insurance Housing Intimacy Leisure Time Physical Health  ADL's:  Impaired  Cognition:  Impaired,  Mild  Sleep:        Treatment Plan Summary: Patient discussed with Dr. Dwyane Dee.  Patient is an 86 year old female admitted after a fall in her home.  Patient appears mildly confused.  Patient appears to have poor insight.  Telephone patient's grandson, Angelica Bell, who believes that patient cannot return to her home alone as she cannot safely care for herself.  Recommendations: Psychiatry consulted to complete capacity evaluation for decision-making regarding disposition. 1.  Currently patient oriented to self only. 2.  Patient does not appear to understand her current condition, nor does she recall the reason she is hospitalized. 3.  Patient does not appear to understand risk of refusing treatment at this time.  Based on my assessment today, patient does not have capacity to make  decisions regarding disposition.  Please contact next of kin or appropriate hospital administrator to help with decision-making process.   Disposition: Supportive therapy provided about ongoing stressors.  Please re consult psychiatry as needed.   Emmaline Kluver, FNP 05/16/2020 4:51 PM

## 2020-05-16 NOTE — Progress Notes (Signed)
Physical Therapy Treatment Patient Details Name: Angelica Bell MRN: [id number] DOB: Nov 28, 1932 Today's Date: 05/16/2020    History of Present Illness Pt is an 87 yo female presenting after being found down at her home with low RR and SpO2 after a fall while transfering from her WC to bed. The pt also reports generally worsening debility for several months. PMH includes: chronic pain on daily opiods, osteoporosis, CVA, HTN, and frequent falls.    PT Comments    Pt supine on arrival reporting stomach cramping and pain. Pt continues to be unaware of day or month but oriented to year and president. Pt able to state how to call 911 in an emergency but unable to recall education or safety with transfers even moments after performing during session. Pt states grandson does all the meal prep and grocery shopping and she microwaves meals. Continue to note decreased cognition and mobility as concern for safety at D/C.     Follow Up Recommendations  SNF;Supervision/Assistance - 24 hour     Equipment Recommendations  Rolling walker with 5" wheels    Recommendations for Other Services       Precautions / Restrictions Precautions Precautions: Fall    Mobility  Bed Mobility   Bed Mobility: Supine to Sit     Supine to sit: Supervision     General bed mobility comments: supervision to transition from supine to sitting toward left  Transfers Overall transfer level: Needs assistance   Transfers: Stand Pivot Transfers Sit to Stand: Min guard Stand pivot transfers: Min guard       General transfer comment: pt performed stand pivot transfers x 5 during session with pt performing 180 degree turn initially grasping chair with both hands. Pt educated for safe pivot to and from chair and then pt need to use BSC and immediately returned to her 180 degree sequence with assist and max cues for safety and sequence  Ambulation/Gait             General Gait Details: pt denied  attempting   Stairs             Wheelchair Mobility    Modified Rankin (Stroke Patients Only)       Balance Overall balance assessment: Needs assistance   Sitting balance-Leahy Scale: Fair       Standing balance-Leahy Scale: Poor Standing balance comment: single UE support in standing                            Cognition Arousal/Alertness: Awake/alert Behavior During Therapy: WFL for tasks assessed/performed Overall Cognitive Status: No family/caregiver present to determine baseline cognitive functioning Area of Impairment: Memory;Safety/judgement                 Orientation Level: Disoriented to;Time Current Attention Level: Sustained Memory: Decreased short-term memory Following Commands: Follows one step commands consistently Safety/Judgement: Decreased awareness of safety;Decreased awareness of deficits   Problem Solving: Difficulty sequencing;Requires verbal cues        Exercises      General Comments        Pertinent Vitals/Pain Pain Score: 6  Pain Location: abdomen Pain Descriptors / Indicators: Aching;Sore Pain Intervention(s): Limited activity within patient's tolerance;Monitored during session;Repositioned    Home Living                      Prior Function            PT Goals (  current goals can now be found in the care plan section) Progress towards PT goals: Progressing toward goals    Frequency    Min 3X/week      PT Plan Current plan remains appropriate    Co-evaluation              AM-PAC PT "6 Clicks" Mobility   Outcome Measure  Help needed turning from your back to your side while in a flat bed without using bedrails?: None Help needed moving from lying on your back to sitting on the side of a flat bed without using bedrails?: None Help needed moving to and from a bed to a chair (including a wheelchair)?: A Little Help needed standing up from a chair using your arms (e.g., wheelchair  or bedside chair)?: A Little Help needed to walk in hospital room?: A Lot Help needed climbing 3-5 steps with a railing? : Total 6 Click Score: 17    End of Session   Activity Tolerance: Patient tolerated treatment well Patient left: in chair;with call bell/phone within reach;with chair alarm set Nurse Communication: Mobility status PT Visit Diagnosis: History of falling (Z91.81);Muscle weakness (generalized) (M62.81);Difficulty in walking, not elsewhere classified (R26.2);Pain     Time: 1000-1019 PT Time Calculation (min) (ACUTE ONLY): 19 min  Charges:  $Therapeutic Activity: 8-22 mins                     Shakila Mak P, PT Acute Rehabilitation Services Pager: [phone] Office: [place] 05/16/2020, 1:03 PM

## 2020-05-16 NOTE — Plan of Care (Signed)
  Problem: Education: Goal: Knowledge of General Education information will improve Description: Including pain rating scale, medication(s)/side effects and non-pharmacologic comfort measures Outcome: Progressing   Problem: Pain Managment: Goal: General experience of comfort will improve Outcome: Progressing   Problem: Safety: Goal: Ability to remain free from injury will improve Outcome: Progressing   

## 2020-05-17 DIAGNOSIS — J9601 Acute respiratory failure with hypoxia: Secondary | ICD-10-CM

## 2020-05-17 LAB — CULTURE, BLOOD (ROUTINE X 2)
Culture: NO GROWTH
Culture: NO GROWTH
Special Requests: ADEQUATE

## 2020-05-17 LAB — MAGNESIUM: Magnesium: 1.8 mg/dL (ref 1.7–2.4)

## 2020-05-17 LAB — BASIC METABOLIC PANEL
Anion gap: 9 (ref 5–15)
BUN: 15 mg/dL (ref 8–23)
CO2: 23 mmol/L (ref 22–32)
Calcium: 9.3 mg/dL (ref 8.9–10.3)
Chloride: 107 mmol/L (ref 98–111)
Creatinine, Ser: 0.74 mg/dL (ref 0.44–1.00)
GFR calc Af Amer: >60 mL/min (ref >60)
GFR calc non Af Amer: >60 mL/min (ref >60)
Glucose, Bld: 128 mg/dL — ABNORMAL HIGH (ref 70–99)
Potassium: 3.6 mmol/L (ref 3.5–5.1)
Sodium: 139 mmol/L (ref 135–145)

## 2020-05-17 MED ORDER — MAGNESIUM OXIDE 400 (241.3 MG) MG PO TABS
400.0000 mg | ORAL_TABLET | Freq: Two times a day (BID) | ORAL | Status: AC
  Administered 2020-05-17 (×2): 400 mg via ORAL
  Filled 2020-05-17 (×2): qty 1

## 2020-05-17 MED ORDER — POTASSIUM CHLORIDE CRYS ER 20 MEQ PO TBCR
20.0000 meq | EXTENDED_RELEASE_TABLET | Freq: Two times a day (BID) | ORAL | Status: DC
  Administered 2020-05-17 – 2020-05-22 (×11): 20 meq via ORAL
  Filled 2020-05-17 (×11): qty 1

## 2020-05-17 NOTE — Plan of Care (Signed)
  Problem: Safety: Goal: Ability to remain free from injury will improve Outcome: Progressing   

## 2020-05-17 NOTE — Progress Notes (Signed)
Notified Dr. Aileen Fass about pt's soft bp this afternoon. Pt only complaint is diarrhea. Pt had oxycodone 5mg  at 12:22. Provider notified, no new orders. Will continue to monitor.

## 2020-05-17 NOTE — Progress Notes (Signed)
[facility] PROGRESS NOTE    Progress Note  Angelica Bell  [id number] DOB: 1932/09/02 DOA: 05/12/2020 PCP: Heywood Bene, PA-C     Brief Narrative:   Angelica Bell is an 86 y.o. female past medical history of chronic pain on chronic narcotics managed by PMNR, CVA, essential hypertension who presents to the ED after having a fall at home.  As per family patient has had progressive weakness over the past several months now wheelchair-bound.  She reports she was transferring from a wheelchair to the bed when she fell, denies hitting her head.  Grandson noticed her to be confused, per EMS report she was breathing about 4 times per minute satting 38% was given Narcan and had sudden significant improvement with her sats in the 80s.  Assessment/Plan:   Respiratory failure with hypoxia due to unintentional opioid overdose: CT head and C-spine were unremarkable. She was given Narcan by EMS and since then her saturations have been stable on room air. She was resumed on her home dose of oxycodone.  Weakness/debilitated: Physical therapy evaluated the patient recommended skilled nursing facility patient refused and wants to go home. Psych was consulted who deemed her not to have capacity to make medical decisions.  Acute kidney injury: Likely prerenal resolved with IV fluid hydration.  Normocytic anemia: Hemoglobin at baseline.  Depression: Likely due to to Lexapro.  Overactive bladder: Continue a myrbetrip.  Chronic pain syndrome: On oxycodone p.o. 3 times a day and Neurontin at home. I will resume at a lower dose and as needed.  Essential hypertension: Pressure at goal with Norvasc.  Hypokalemia: Repleted orally now resolved.  Hypomagnesemia: 1.8, it has been repleted orally stable.  DVT prophylaxis: lovenox Family Communication:HCPOA Status is: Inpatient  Remains inpatient appropriate because:Unsafe d/c plan   Dispo: The patient is from:  Home              Anticipated d/c is to: SNF              Anticipated d/c date is: 1 day              Patient currently is medically stable to d/c.         Code Status:     Code Status Orders  (From admission, onward)         Start     Ordered   05/13/20 0059  Full code  Continuous     05/13/20 0059        Code Status History    Date Active Date Inactive Code Status Order ID Comments User Context   03/08/2020 [id number] 03/12/2020 1619 DNR [id number]  Reubin Milan, MD ED   02/05/2020 1632 02/08/2020 1851 DNR [id number]  Chauncey Mann, MD ED   08/31/2019 1217 09/15/2019 1847 Full Code [id number]  Candee Furbish, MD ED   07/26/2019 0639 08/07/2019 2128 Full Code [id number]  Jani Gravel, MD Inpatient   10/25/2017 2021 10/31/2017 2107 Full Code [id number]  Mercy Riding, MD Inpatient   02/16/2017 1835 02/19/2017 1633 DNR [id number]  Gwynne Edinger, MD Inpatient   12/25/2015 0111 12/27/2015 1917 Full Code [id number]  Danford, Suann Larry, MD Inpatient   Advance Care Planning Activity        IV Access:    Peripheral IV   Procedures and diagnostic studies:   No results found.   Medical Consultants:    None.  Anti-Infectives:   None  Subjective:    Angelica Bell no  complaints tolerating her diet.  Objective:    Vitals:   05/16/20 1455 05/16/20 1900 05/17/20 0300 05/17/20 0741  BP: (!) 164/82 (!) 151/78 (!) 180/63 (!) 145/75  Pulse: 82 77 85 87  Resp: 18 17  14   Temp: 98.5 F (36.9 C) 98.1 F (36.7 C) 98.4 F (36.9 C) 98.3 F (36.8 C)  TempSrc: Oral Oral Oral Oral  SpO2: 97% 93% 93% 95%  Weight:      Height:       SpO2: 95 % O2 Flow Rate (L/min): 2 L/min   Intake/Output Summary (Last 24 hours) at 05/17/2020 1142 Last data filed at 05/17/2020 0900 Gross per 24 hour  Intake 600 ml  Output --  Net 600 ml   Filed Weights   05/12/20 2005  Weight: 44 kg    Exam: General exam: In no acute distress. Respiratory system: Good air movement and  clear to auscultation. Cardiovascular system: S1 & S2 heard, RRR. No JVD. Gastrointestinal system: Abdomen is nondistended, soft and nontender.  Extremities: No pedal edema.    Data Reviewed:    Labs: Basic Metabolic Panel: Recent Labs  Lab 05/12/20 2103 05/12/20 2103 05/14/20 0235 05/14/20 0235 05/15/20 [id number] 05/15/20 0718 05/16/20 0550 05/17/20 0310  NA 135  --  140  --  140  --  138 139  K 4.3   < > 4.0   < > 3.1*   < > 4.1 3.6  CL 102  --  112*  --  110  --  106 107  CO2 25  --  24  --  22  --  20* 23  GLUCOSE 126*  --  109*  --  106*  --  91 128*  BUN 21  --  15  --  11  --  9 15  CREATININE 1.59*  --  0.83  --  0.70  --  0.73 0.74  CALCIUM 9.1  --  9.1  --  8.7*  --  9.0 9.3  MG  --   --  1.6*  --  1.6*  --  2.2 1.8   < > = values in this interval not displayed.   GFR Estimated Creatinine Clearance: 35.1 mL/min (by C-G formula based on SCr of 0.74 mg/dL). Liver Function Tests: Recent Labs  Lab 05/12/20 2103  AST 24  ALT 15  ALKPHOS 64  BILITOT 0.7  PROT 5.3*  ALBUMIN 3.2*   No results for input(s): LIPASE, AMYLASE in the last 168 hours. No results for input(s): AMMONIA in the last 168 hours. Coagulation profile No results for input(s): INR, PROTIME in the last 168 hours. COVID-19 Labs  No results for input(s): DDIMER, FERRITIN, LDH, CRP in the last 72 hours.  Lab Results  Component Value Date   SARSCOV2NAA NEGATIVE 05/12/2020   [place] NEGATIVE 03/08/2020   [place] NEGATIVE 02/05/2020   SARSCOV2NAA NOT DETECTED 09/13/2019    CBC: Recent Labs  Lab 05/12/20 2103  WBC 11.7*  NEUTROABS 9.3*  HGB 10.9*  HCT 35.7*  MCV 91.8  PLT 195   Cardiac Enzymes: Recent Labs  Lab 05/12/20 2100  CKTOTAL 167   BNP (last 3 results) No results for input(s): PROBNP in the last 8760 hours. CBG: No results for input(s): GLUCAP in the last 168 hours. D-Dimer: No results for input(s): DDIMER in the last 72 hours. Hgb A1c: No results for  input(s): HGBA1C in the last 72 hours. Lipid Profile: No results for input(s): CHOL, HDL, LDLCALC, TRIG, CHOLHDL, LDLDIRECT  in the last 72 hours. Thyroid function studies: No results for input(s): TSH, T4TOTAL, T3FREE, THYROIDAB in the last 72 hours.  Invalid input(s): FREET3 Anemia work up: No results for input(s): VITAMINB12, FOLATE, FERRITIN, TIBC, IRON, RETICCTPCT in the last 72 hours. Sepsis Labs: Recent Labs  Lab 05/12/20 2103 05/12/20 2311  WBC 11.7*  --   LATICACIDVEN 0.9 0.8   Microbiology Recent Results (from the past 240 hour(s))  Culture, blood (routine x 2)     Status: None   Collection Time: 05/12/20  9:04 PM   Specimen: BLOOD  Result Value Ref Range Status   Specimen Description BLOOD SITE NOT SPECIFIED  Final   Special Requests   Final    BOTTLES DRAWN AEROBIC AND ANAEROBIC Blood Culture results may not be optimal due to an inadequate volume of blood received in culture bottles   Culture   Final    NO GROWTH 5 DAYS Performed at [facility], [place] [address], [place], [place] [address]    Report Status 05/17/2020 FINAL  Final  SARS Coronavirus 2 by RT PCR (hospital order, performed in [facility] hospital lab) Nasopharyngeal Nasopharyngeal Swab     Status: None   Collection Time: 05/12/20 10:18 PM   Specimen: Nasopharyngeal Swab  Result Value Ref Range Status   SARS Coronavirus 2 NEGATIVE NEGATIVE Final    Comment: (NOTE) SARS-CoV-2 target nucleic acids are NOT DETECTED. The SARS-CoV-2 RNA is generally detectable in upper and lower respiratory specimens during the acute phase of infection. The lowest concentration of SARS-CoV-2 viral copies this assay can detect is 250 copies / mL. A negative result does not preclude SARS-CoV-2 infection and should not be used as the sole basis for treatment or other patient management decisions.  A negative result may occur with improper specimen collection / handling, submission of specimen other than nasopharyngeal  swab, presence of viral mutation(s) within the areas targeted by this assay, and inadequate number of viral copies (<250 copies / mL). A negative result must be combined with clinical observations, patient history, and epidemiological information. Fact Sheet for Patients:   StrictlyIdeas.no Fact Sheet for Healthcare Providers: BankingDealers.co.za This test is not yet approved or cleared  by the [place] FDA and has been authorized for detection and/or diagnosis of SARS-CoV-2 by FDA under an Emergency Use Authorization (EUA).  This EUA will remain in effect (meaning this test can be used) for the duration of the COVID-19 declaration under Section 564(b)(1) of the Act, 21 U.S.C. section 360bbb-3(b)(1), unless the authorization is terminated or revoked sooner. Performed at [facility], [place] [address] [place], [place] [address]   Culture, blood (routine x 2)     Status: None   Collection Time: 05/12/20 11:04 PM   Specimen: BLOOD  Result Value Ref Range Status   Specimen Description BLOOD RIGHT ANTECUBITAL  Final   Special Requests   Final    BOTTLES DRAWN AEROBIC AND ANAEROBIC Blood Culture adequate volume   Culture   Final    NO GROWTH 5 DAYS Performed at [facility], [place] [address] [place], [place] [address]    Report Status 05/17/2020 FINAL  Final     Medications:   . amLODipine  2.5 mg Oral Daily  . aspirin EC  81 mg Oral Daily  . escitalopram  20 mg Oral Daily  . heparin  5,000 Units Subcutaneous Q8H  . mirabegron ER  25 mg Oral Daily   Continuous Infusions:  LOS: 4 days   Charlynne Cousins  [facility]  05/17/2020, 11:42 AM

## 2020-05-17 NOTE — TOC Progression Note (Signed)
Transition of Care [facility]) - Progression Note    Patient Details  Name: Angelica Bell MRN: [id number] Date of Birth: [birth year]  Transition of Care [facility]) CM/SW [place], RN Phone Number: 05/17/2020, 2:27 PM  Clinical Narrative:    Case Management noted evaluation by Psychiatry and patient noted to not have capacity to make decisions for her care.  I called and spoke with the grandson, [name] Case, and gave him Medicare Choice regarding SNF placement.  Grandson did not have a preference but states that he would not mind her being placed close to [facility] or in [place], where he works.  Patient states that he lives in [place] and her placement in this county would make it more convenient for him to visit when he is able due to [place] restrictions.  SNF workup started and I will see him this afternoon in person to hand him Medicare.gov education comparison for decisions about placement.  Will continue to follow and start insurance authorization.   Expected Discharge Plan: (home with home health versus SNF placement - pending psych consult for capacity) Barriers to Discharge: Unsafe home situation, Continued Medical Work up, Family Issues  Expected Discharge Plan and Services Expected Discharge Plan: (home with home health versus SNF placement - pending psych consult for capacity)   Discharge Planning Services: CM Consult Post Acute Care Choice: Durable Medical Equipment Living arrangements for the past 2 months: Single Family Home                 DME Arranged: (patient currently has WC and 3:1) DME Agency: NA       HH Arranged: Refused SNF           Social Determinants of Health (SDOH) Interventions    Readmission Risk Interventions Readmission Risk Prevention Plan 05/15/2020  Transportation Screening Complete  PCP or Specialist Appt within 3-5 Days Complete  HRI or [place] Complete  Social Work Consult for [place]  Planning/Counseling Complete  Palliative Care Screening Complete  Medication Review Press photographer) Complete  Some recent data might be hidden

## 2020-05-17 NOTE — Progress Notes (Signed)
Per [place], CM - nurse will hold covid test until insurance authorization has been approved. CM team will keep nursing staff updated.

## 2020-05-17 NOTE — NC FL2 (Signed)
[place] MEDICAID FL2 LEVEL OF CARE SCREENING TOOL     IDENTIFICATION  Patient Name: Angelica Bell Birthdate: 1932/09/12 Sex: female Admission Date (Current Location): 05/12/2020  [facility] and [place] Number:  Herbalist and Address:  [facility], [place] [address] [place], [place] [address]      Provider Number: [id number]  Attending Physician Name and Address:  Charlynne Cousins, MD  Relative Name and Phone Number:  Barbarann Ehlers [phone]    Current Level of Care: Hospital Recommended Level of Care: [place] Prior Approval Number:    Date Approved/Denied:   PASRR Number: [id number]  Discharge Plan: SNF    Current Diagnoses: Patient Active Problem List   Diagnosis Date Noted  . Acute respiratory failure with hypoxia ([name]) 05/17/2020  . Opioid overdose ([place]) 05/13/2020  . DNR (do not resuscitate) 03/09/2020  . Frequent falls 03/09/2020  . Multiple rib fractures 03/08/2020  . Common bile duct dilatation 03/08/2020  . Leukocytosis 03/08/2020  . Normocytic anemia 03/08/2020  . Hypomagnesemia 02/08/2020  . Fall 02/06/2020  . AKI (acute kidney injury) ([place]) 02/05/2020  . UTI (urinary tract infection) 09/01/2019  . Rhabdomyolysis 09/01/2019  . Shock ([place]) 08/31/2019  . Acute cystitis 08/03/2019  . Confusion and disorientation 08/03/2019  . Cerebrovascular accident (CVA) ([place])   . ARF (acute renal failure) ([place]) 07/26/2019  . Hyponatremia 07/26/2019  . Abnormal liver function 07/26/2019  . Peripheral neuropathy   . Drug overdose   . Polypharmacy   . Altered mental status 10/25/2017  . Closed displaced intertrochanteric fracture of left femur ([place]) 02/16/2017  . Hip fracture, right ([place]) 12/25/2015  . Chronic pain 12/24/2015  . Fracture of femoral neck, right, closed ([place]) 12/24/2015  . Essential hypertension 12/24/2015  . Community acquired pneumonia 04/15/2013  . Hypokalemia 04/15/2013  .  Labial cyst 02/08/2012  . POSTMENOPAUSAL SYNDROME 11/28/2009  . PERIPHERAL NEUROPATHY, LOWER EXTREMITY, RIGHT 02/22/2009  . INSOMNIA-SLEEP DISORDER-UNSPEC 10/12/2008  . Lumbago 08/02/2008  . 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 ANXIETY DEPRESSION 12/16/2007  . Hyperlipidemia 07/08/2007  . Osteoarthritis 07/08/2007  . Osteoporosis 07/08/2007  . SCOLIOSIS NEC 07/08/2007    Orientation RESPIRATION BLADDER Height & Weight     Self  Normal External catheter, Incontinent Weight: 44 kg Height:  5' (152.4 cm)  BEHAVIORAL SYMPTOMS/MOOD NEUROLOGICAL BOWEL NUTRITION STATUS      Continent Diet(See Discharge Summary)  AMBULATORY STATUS COMMUNICATION OF NEEDS Skin   Extensive Assist Verbally Normal                       Personal Care Assistance Level of Assistance  Dressing     Dressing Assistance: Limited assistance     Functional Limitations Info  Sight, Hearing, Speech Sight Info: Adequate Hearing Info: Adequate Speech Info: Adequate    SPECIAL CARE FACTORS FREQUENCY  PT (By licensed PT), OT (By licensed OT)     PT Frequency: 5 times per week OT Frequency: 5 times per week            Contractures      Additional Factors Info  Code Status, Allergies Code Status Info: Full code Allergies Info: Hctz, Amoxicillin, lisinopril, sulfa,           Current Medications (05/17/2020):  This is the current hospital active medication list Current Facility-Administered Medications  Medication Dose Route Frequency Provider Last Rate Last Admin  . amLODipine (NORVASC) tablet 2.5 mg  2.5 mg Oral Daily  [place] Donnamarie Poag, DO   2.5 mg at 05/17/20 1014  . aspirin EC tablet 81 mg  81 mg Oral Daily Gwynne Edinger, MD   81 mg at 05/17/20 1014  . escitalopram (LEXAPRO) tablet 20 mg  20 mg Oral Daily Gwynne Edinger, MD   20 mg at 05/17/20 1014  . gabapentin (NEURONTIN) capsule 100 mg  100 mg Oral PRN Gwynne Edinger, MD   100 mg at 05/17/20 1051  . heparin  injection 5,000 Units  5,000 Units Subcutaneous Q8H Gwynne Edinger, MD   5,000 Units at 05/17/20 1223  . magnesium oxide (MAG-OX) tablet 400 mg  400 mg Oral BID Charlynne Cousins, MD   400 mg at 05/17/20 1222  . mirabegron ER (MYRBETRIQ) tablet 25 mg  25 mg Oral Daily Gwynne Edinger, MD   25 mg at 05/17/20 1014  . oxyCODONE (Oxy IR/ROXICODONE) immediate release tablet 5 mg  5 mg Oral Q8H PRN [place] Eric J, DO   5 mg at 05/17/20 1222  . potassium chloride SA (KLOR-CON) CR tablet 20 mEq  20 mEq Oral BID Charlynne Cousins, MD   20 mEq at 05/17/20 1222     Discharge Medications: Please see discharge summary for a list of discharge medications.  Relevant Imaging Results:  Relevant Lab Results:   Additional Information SS# [id number]  Curlene Labrum, RN

## 2020-05-18 DIAGNOSIS — J9601 Acute respiratory failure with hypoxia: Secondary | ICD-10-CM

## 2020-05-18 MED ORDER — AMLODIPINE BESYLATE 2.5 MG PO TABS: 2.5000 mg | ORAL_TABLET | Freq: Every day | ORAL | Status: AC

## 2020-05-18 MED ORDER — OXYCODONE HCL 10 MG PO TABS: 30.0000 mg | ORAL_TABLET | Freq: Three times a day (TID) | ORAL | 0 refills | Status: AC | PRN

## 2020-05-18 MED ORDER — OXYCODONE HCL 30 MG PO TABS: 30.0000 mg | ORAL_TABLET | Freq: Three times a day (TID) | ORAL | 0 refills | Status: DC | PRN

## 2020-05-18 MED ORDER — MELATONIN 3 MG PO TABS
6.0000 mg | ORAL_TABLET | Freq: Every evening | ORAL | Status: DC | PRN
  Administered 2020-05-18 – 2020-05-21 (×4): 6 mg via ORAL
  Filled 2020-05-18 (×4): qty 2

## 2020-05-18 NOTE — Plan of Care (Signed)
  Problem: Safety: Goal: Ability to remain free from injury will improve Outcome: Progressing   

## 2020-05-18 NOTE — TOC Progression Note (Signed)
Transition of Care [facility]) - Progression Note    Patient Details  Name: Angelica Bell MRN: [id number] Date of Birth: 06/22/1932  Transition of Care [facility]) CM/SW [place], RN Phone Number: 05/18/2020, 11:44 AM  Clinical Narrative:     Case management met with the patient and spoke with the son and the family has chosen [facility] in [place].  Ginette Pitman, CM with [facility] is starting [employer] authorization and they are aware that the patient had not had either COVID vaccines.  The patient will need a COVID vaccine tomorrow in preparation for her admission to [facility].  Expected Discharge Plan: (home with home health versus SNF placement - pending psych consult for capacity) Barriers to Discharge: Unsafe home situation, Continued Medical Work up, Family Issues  Expected Discharge Plan and Services Expected Discharge Plan: (home with home health versus SNF placement - pending psych consult for capacity)   Discharge Planning Services: CM Consult Post Acute Care Choice: Durable Medical Equipment Living arrangements for the past 2 months: Single Family Home Expected Discharge Date: 05/18/20               DME Arranged: (patient currently has WC and 3:1) DME Agency: NA       HH Arranged: Refused SNF           Social Determinants of Health (SDOH) Interventions    Readmission Risk Interventions Readmission Risk Prevention Plan 05/15/2020  Transportation Screening Complete  PCP or Specialist Appt within 3-5 Days Complete  HRI or Home Care Consult Complete  Social Work Consult for [place] Planning/Counseling Complete  Palliative Care Screening Complete  Medication Review Press photographer) Complete  Some recent data might be hidden

## 2020-05-18 NOTE — Discharge Summary (Addendum)
Physician Discharge Summary  Angelica Bell [id number] DOB: [birth year] DOA: 05/12/2020  PCP: Heywood Bene, PA-C  Admit date: 05/12/2020 Discharge date: 05/19/2020  Admitted From: Home Disposition:  SNF  Recommendations for Outpatient Follow-up:  1. Follow up with PCP in 1-2 weeks 2. Palliative care to meet at facility discuss goals of care and DNR status.   Home Health:No Equipment/Devices:None   Discharge Condition:Stable CODE STATUS:Full Diet recommendation: Heart Healthy   Brief/Interim Summary: 86 y.o. female past medical history of chronic pain on chronic narcotics managed by PMNR, CVA, essential hypertension who presents to the ED after having a fall at home.  As per family patient has had progressive weakness over the past several months now wheelchair-bound.  She reports she was transferring from a wheelchair to the bed when she fell, denies hitting her head.  Grandson noticed her to be confused, per EMS report she was breathing about 4 times per minute satting 38% was given Narcan and had sudden significant improvement with her sats in the 80s  Discharge Diagnoses:  Active Problems:   ANXIETY DEPRESSION   Chronic pain   AKI (acute kidney injury) ([place])   Opioid overdose (HCC)   Acute respiratory failure with hypoxia (HCC)  Acute respiratory failure with hypoxia due to unintentional opioid overdose: CT scan of the head and C-spine were unremarkable, EMS gave Narcan home and she immediately responded. When she was more awake she was complaining of pain and her oxycodone was resumed as needed. Psych evaluated the patient and deemed her not to have capacity to make medical decisions. Her grandson who she relates his next of kin's is making her decisions.  Weakness/debilitated: Physical therapy evaluated the patient recommended skilled nursing facility. As she was refusing skilled nursing facility psych was consulted and deemed her not to have capacity to make  medical decisions.   Acute kidney injury: Likely prerenal azotemia resolved with IV fluid hydration.  Normocytic anemia: No signs of overt bleeding hemoglobin at baseline.  Depression: There have to be evaluated as an outpatient, her Lexapro was DC'd she remained in a good mood tolerating her diet with no anhedonia.  Chronic pain syndrome: Continue Neurontin and Oxley as needed  Essential hypertension: We will control blood pressure goal with low-dose Norvasc.  Electrolyte imbalance hypokalemia/hypomagnesemia: Repleted now resolved.   Discharge Instructions  Discharge Instructions    Diet - low sodium heart healthy   Complete by: As directed    Increase activity slowly   Complete by: As directed      Allergies as of 05/18/2020      Reactions   Hctz [hydrochlorothiazide]    Significant and rapid hyponatremia (TIH)   Amoxicillin-pot Clavulanate Hives, Diarrhea   Has patient had a PCN reaction causing immediate rash, facial/tongue/throat swelling, SOB or lightheadedness with hypotension: yes Has patient had a PCN reaction causing severe rash involving mucus membranes or skin necrosis: yes Has patient had a PCN reaction that required hospitalization : unknown Has patient had a PCN reaction occurring within the last 10 years: unknown If all of the above answers are "NO", then may proceed with Cephalosporin use.   Lisinopril-hydrochlorothiazide Other (See Comments)   Tired- no energy to do anything..   Sulfa Antibiotics Hives, Nausea And Vomiting   Sulfonamide Derivatives Hives      Medication List    STOP taking these medications   atorvastatin 10 MG tablet Commonly known as: LIPITOR   methocarbamol 500 MG tablet Commonly known as: ROBAXIN  TAKE these medications   acetaminophen 500 MG tablet Commonly known as: TYLENOL Take 500 mg by mouth every 6 (six) hours as needed.   amLODipine 2.5 MG tablet Commonly known as: NORVASC Take 1 tablet (2.5 mg total) by  mouth daily.   aspirin 81 MG EC tablet Take 1 tablet (81 mg total) by mouth daily.   feeding supplement (ENSURE ENLIVE) Liqd Take 237 mLs by mouth 2 (two) times daily between meals.   gabapentin 100 MG capsule Commonly known as: NEURONTIN Take 1 capsule (100 mg total) by mouth 3 (three) times daily. What changed:   when to take this  reasons to take this   multivitamin with minerals Tabs tablet Take 1 tablet by mouth daily.   oxycodone 30 MG immediate release tablet Commonly known as: ROXICODONE Take 1 tablet (30 mg total) by mouth 3 (three) times daily as needed for pain. What changed: Another medication with the same name was removed. Continue taking this medication, and follow the directions you see here.       Allergies  Allergen Reactions  . Hctz [Hydrochlorothiazide]     Significant and rapid hyponatremia (TIH)  . Amoxicillin-Pot Clavulanate Hives and Diarrhea    Has patient had a PCN reaction causing immediate rash, facial/tongue/throat swelling, SOB or lightheadedness with hypotension: yes Has patient had a PCN reaction causing severe rash involving mucus membranes or skin necrosis: yes Has patient had a PCN reaction that required hospitalization : unknown Has patient had a PCN reaction occurring within the last 10 years: unknown If all of the above answers are "NO", then may proceed with Cephalosporin use.   Marland Kitchen Lisinopril-Hydrochlorothiazide Other (See Comments)    Tired- no energy to do anything..  . Sulfa Antibiotics Hives and Nausea And Vomiting  . Sulfonamide Derivatives Hives    Consultations:  Psychiatry   Procedures/Studies: CT Abdomen Pelvis Wo Contrast  Result Date: 05/12/2020 CLINICAL DATA:  Recent trip and fall with abdominal pain, initial encounter EXAM: CT ABDOMEN AND PELVIS WITHOUT CONTRAST TECHNIQUE: Multidetector CT imaging of the abdomen and pelvis was performed following the standard protocol without IV contrast. COMPARISON:  03/08/2020  FINDINGS: Lower chest: Lung bases again demonstrate some mild scarring stable from the prior exam. Hiatal hernia is again Hepatobiliary: No focal liver abnormality is seen. No gallstones, gallbladder wall thickening, or biliary dilatation. Pancreas: Unremarkable. No pancreatic ductal dilatation or surrounding inflammatory changes. Spleen: Normal in size without focal abnormality. Adrenals/Urinary Tract: Adrenal glands are within normal limits. Kidneys demonstrate no renal calculi or obstructive changes. Bladder is well distended. Stomach/Bowel: Diverticular change of the colon is noted without evidence of diverticulitis. No obstructive or inflammatory changes are noted. The appendix is not well visualized consistent with prior surgical excision. Small bowel is unremarkable. No gastric abnormality is noted with the exception of the previously mentioned hiatal hernia. Vascular/Lymphatic: Aortic calcifications are noted. No significant lymphadenopathy is noted. Left-sided IVC is noted and stable. Reproductive: Status post hysterectomy. No adnexal masses. Other: No abdominal wall hernia or abnormality. No abdominopelvic ascites. Musculoskeletal: Right hip replacement is seen. Surgical fixation of the proximal left hip is noted. Degenerative changes of the lumbar spine are seen. Scoliosis concave to the right is again noted. No acute abnormality is noted. IMPRESSION: Chronic changes as described above without acute abnormality. Electronically Signed   By: Inez Catalina M.D.   On: 05/12/2020 22:57   DG Chest 1 View  Result Date: 05/12/2020 CLINICAL DATA:  Pain after fall. EXAM: CHEST  1 VIEW  COMPARISON:  Frontal and lateral views 03/30/2020. Chest CT 03/08/2020 FINDINGS: Normal heart size. Unchanged mediastinal contours with aortic atherosclerosis. No focal airspace disease. No pneumothorax or pleural effusion. Healing right rib fractures again seen. Remote left proximal humerus fracture. No acute osseous abnormalities  are seen on this single frontal view. Scoliotic curvature of the spine. IMPRESSION: No acute finding. Electronically Signed   By: Keith Rake M.D.   On: 05/12/2020 20:31   CT Head Wo Contrast  Result Date: 05/12/2020 CLINICAL DATA:  Recent fall EXAM: CT HEAD WITHOUT CONTRAST CT CERVICAL SPINE WITHOUT CONTRAST TECHNIQUE: Multidetector CT imaging of the head and cervical spine was performed following the standard protocol without intravenous contrast. Multiplanar CT image reconstructions of the cervical spine were also generated. COMPARISON:  03/08/2020 FINDINGS: CT HEAD FINDINGS Brain: Chronic atrophic and ischemic changes are noted. No findings to suggest acute hemorrhage, acute infarction or space-occupying mass lesion are seen. Vascular: No hyperdense vessel or unexpected calcification. Skull: Normal. Negative for fracture or focal lesion. Sinuses/Orbits: No acute finding. Other: None. CT CERVICAL SPINE FINDINGS Alignment: Within normal limits. Skull base and vertebrae: 7 cervical segments are well visualized. Vertebral body height is well maintained. Mild disc space narrowing is noted from C4-C7 with associated osteophytic changes. Mild facet hypertrophic changes are seen. The odontoid is within normal limits. No findings to suggest acute fracture or acute facet abnormality are noted. Soft tissues and spinal canal: Surrounding soft tissue structures show significant vascular calcifications. The thyroid demonstrates multiple hypodense nodules. These are stable in appearance from a prior CT from 2018 and felt to be benign in etiology. Upper chest: Visualized lung apices are within normal limits. Other: None IMPRESSION: CT of the head: Chronic atrophic and ischemic changes without acute abnormality. CT of the cervical spine: Multilevel degenerative change without acute abnormality. Multiple hypodense nodules throughout the thyroid gland stable from 2018. No followup recommended (ref: J Am Coll Radiol. 2015  Feb;12(2): 143-50). Electronically Signed   By: Inez Catalina M.D.   On: 05/12/2020 20:23   CT Cervical Spine Wo Contrast  Result Date: 05/12/2020 CLINICAL DATA:  Recent fall EXAM: CT HEAD WITHOUT CONTRAST CT CERVICAL SPINE WITHOUT CONTRAST TECHNIQUE: Multidetector CT imaging of the head and cervical spine was performed following the standard protocol without intravenous contrast. Multiplanar CT image reconstructions of the cervical spine were also generated. COMPARISON:  03/08/2020 FINDINGS: CT HEAD FINDINGS Brain: Chronic atrophic and ischemic changes are noted. No findings to suggest acute hemorrhage, acute infarction or space-occupying mass lesion are seen. Vascular: No hyperdense vessel or unexpected calcification. Skull: Normal. Negative for fracture or focal lesion. Sinuses/Orbits: No acute finding. Other: None. CT CERVICAL SPINE FINDINGS Alignment: Within normal limits. Skull base and vertebrae: 7 cervical segments are well visualized. Vertebral body height is well maintained. Mild disc space narrowing is noted from C4-C7 with associated osteophytic changes. Mild facet hypertrophic changes are seen. The odontoid is within normal limits. No findings to suggest acute fracture or acute facet abnormality are noted. Soft tissues and spinal canal: Surrounding soft tissue structures show significant vascular calcifications. The thyroid demonstrates multiple hypodense nodules. These are stable in appearance from a prior CT from 2018 and felt to be benign in etiology. Upper chest: Visualized lung apices are within normal limits. Other: None IMPRESSION: CT of the head: Chronic atrophic and ischemic changes without acute abnormality. CT of the cervical spine: Multilevel degenerative change without acute abnormality. Multiple hypodense nodules throughout the thyroid gland stable from 2018. No followup recommended (ref:  J Am Coll Radiol. 2015 Feb;12(2): 143-50). Electronically Signed   By: Inez Catalina M.D.   On:  05/12/2020 20:23   DG Hips Bilat W or Wo Pelvis 3-4 Views  Result Date: 05/12/2020 CLINICAL DATA:  Pain after fall. EXAM: DG HIP (WITH OR WITHOUT PELVIS) 3-4V BILAT COMPARISON:  Hip radiograph 02/05/2020, right hip CT same day. FINDINGS: Right hip arthroplasty in expected alignment. No periprosthetic lucency or fracture. Intramedullary rod with locking and trans trochanteric screw in the left proximal femur. No periprosthetic lucency or fracture. No evidence of pelvic ring or pubic rami fracture. Degenerative change of the pubic symphysis and sacroiliac joints. Scoliotic curvature of the included lower lumbar spine. IMPRESSION: 1. No acute fracture of the pelvis or hips. 2. Intact right hip arthroplasty and surgical hardware in the left proximal femur. No hardware complication or periprosthetic fracture. Electronically Signed   By: Keith Rake M.D.   On: 05/12/2020 20:35      Subjective: No complaints.  Discharge Exam: Vitals:   05/17/20 2023 05/18/20 0811  BP: (!) 142/80 (!) 145/78  Pulse: 78 85  Resp: 17 15  Temp: 98.2 F (36.8 C) 99.3 F (37.4 C)  SpO2: 96% 96%   Vitals:   05/17/20 1429 05/17/20 1600 05/17/20 2023 05/18/20 0811  BP: (!) 104/54 133/79 (!) 142/80 (!) 145/78  Pulse: 84 87 78 85  Resp: 15  17 15   Temp: 98.9 F (37.2 C)  98.2 F (36.8 C) 99.3 F (37.4 C)  TempSrc: Oral  Oral Oral  SpO2: 97%  96% 96%  Weight:      Height:        General: Pt is alert, awake, not in acute distress Cardiovascular: RRR, S1/S2 +, no rubs, no gallops Respiratory: CTA bilaterally, no wheezing, no rhonchi Abdominal: Soft, NT, ND, bowel sounds + Extremities: no edema, no cyanosis    The results of significant diagnostics from this hospitalization (including imaging, microbiology, ancillary and laboratory) are listed below for reference.     Microbiology: Recent Results (from the past 240 hour(s))  Culture, blood (routine x 2)     Status: None   Collection Time: 05/12/20   9:04 PM   Specimen: BLOOD  Result Value Ref Range Status   Specimen Description BLOOD SITE NOT SPECIFIED  Final   Special Requests   Final    BOTTLES DRAWN AEROBIC AND ANAEROBIC Blood Culture results may not be optimal due to an inadequate volume of blood received in culture bottles   Culture   Final    NO GROWTH 5 DAYS Performed at [facility], [place] [address] [place],  [address]    Report Status 05/17/2020 FINAL  Final  SARS Coronavirus 2 by RT PCR (hospital order, performed in [facility] hospital lab) Nasopharyngeal Nasopharyngeal Swab     Status: None   Collection Time: 05/12/20 10:18 PM   Specimen: Nasopharyngeal Swab  Result Value Ref Range Status   SARS Coronavirus 2 NEGATIVE NEGATIVE Final    Comment: (NOTE) SARS-CoV-2 target nucleic acids are NOT DETECTED. The SARS-CoV-2 RNA is generally detectable in upper and lower respiratory specimens during the acute phase of infection. The lowest concentration of SARS-CoV-2 viral copies this assay can detect is 250 copies / mL. A negative result does not preclude SARS-CoV-2 infection and should not be used as the sole basis for treatment or other patient management decisions.  A negative result may occur with improper specimen collection / handling, submission of specimen other than nasopharyngeal  swab, presence of viral mutation(s) within the areas targeted by this assay, and inadequate number of viral copies (<250 copies / mL). A negative result must be combined with clinical observations, patient history, and epidemiological information. Fact Sheet for Patients:   StrictlyIdeas.no Fact Sheet for Healthcare Providers: BankingDealers.co.za This test is not yet approved or cleared  by the [place] FDA and has been authorized for detection and/or diagnosis of SARS-CoV-2 by FDA under an Emergency Use Authorization (EUA).  This EUA will remain in effect (meaning this  test can be used) for the duration of the COVID-19 declaration under Section 564(b)(1) of the Act, 21 U.S.C. section 360bbb-3(b)(1), unless the authorization is terminated or revoked sooner. Performed at [facility], [place] [address], [place], [place] [address]   Culture, blood (routine x 2)     Status: None   Collection Time: 05/12/20 11:04 PM   Specimen: BLOOD  Result Value Ref Range Status   Specimen Description BLOOD RIGHT ANTECUBITAL  Final   Special Requests   Final    BOTTLES DRAWN AEROBIC AND ANAEROBIC Blood Culture adequate volume   Culture   Final    NO GROWTH 5 DAYS Performed at [facility], [place] [address] [place], [place] [address]    Report Status 05/17/2020 FINAL  Final     Labs: BNP (last 3 results) No results for input(s): BNP in the last 8760 hours. Basic Metabolic Panel: Recent Labs  Lab 05/12/20 2103 05/14/20 0235 05/15/20 0718 05/16/20 0550 05/17/20 0310  NA 135 140 140 138 139  K 4.3 4.0 3.1* 4.1 3.6  CL 102 112* 110 106 107  CO2 25 24 22  20* 23  GLUCOSE 126* 109* 106* 91 128*  BUN 21 15 11 9 15   CREATININE 1.59* 0.83 0.70 0.73 0.74  CALCIUM 9.1 9.1 8.7* 9.0 9.3  MG  --  1.6* 1.6* 2.2 1.8   Liver Function Tests: Recent Labs  Lab 05/12/20 2103  AST 24  ALT 15  ALKPHOS 64  BILITOT 0.7  PROT 5.3*  ALBUMIN 3.2*   No results for input(s): LIPASE, AMYLASE in the last 168 hours. No results for input(s): AMMONIA in the last 168 hours. CBC: Recent Labs  Lab 05/12/20 2103  WBC 11.7*  NEUTROABS 9.3*  HGB 10.9*  HCT 35.7*  MCV 91.8  PLT 195   Cardiac Enzymes: Recent Labs  Lab 05/12/20 2100  CKTOTAL 167   BNP: Invalid input(s): POCBNP CBG: No results for input(s): GLUCAP in the last 168 hours. D-Dimer No results for input(s): DDIMER in the last 72 hours. Hgb A1c No results for input(s): HGBA1C in the last 72 hours. Lipid Profile No results for input(s): CHOL, HDL, LDLCALC, TRIG, CHOLHDL, LDLDIRECT in the last 72  hours. Thyroid function studies No results for input(s): TSH, T4TOTAL, T3FREE, THYROIDAB in the last 72 hours.  Invalid input(s): FREET3 Anemia work up No results for input(s): VITAMINB12, FOLATE, FERRITIN, TIBC, IRON, RETICCTPCT in the last 72 hours. Urinalysis    Component Value Date/Time   COLORURINE AMBER (A) 05/13/2020 0145   APPEARANCEUR CLOUDY (A) 05/13/2020 0145   LABSPEC 1.015 05/13/2020 0145   PHURINE 5.0 05/13/2020 0145   GLUCOSEU NEGATIVE 05/13/2020 0145   HGBUR NEGATIVE 05/13/2020 0145   HGBUR negative 04/24/2009 1635   BILIRUBINUR NEGATIVE 05/13/2020 0145   BILIRUBINUR negative 05/29/2011 1439   KETONESUR NEGATIVE 05/13/2020 0145   PROTEINUR NEGATIVE 05/13/2020 0145   UROBILINOGEN 0.2 01/26/2014 1912   NITRITE NEGATIVE 05/13/2020 0145  LEUKOCYTESUR NEGATIVE 05/13/2020 0145   Sepsis Labs Invalid input(s): PROCALCITONIN,  WBC,  LACTICIDVEN Microbiology Recent Results (from the past 240 hour(s))  Culture, blood (routine x 2)     Status: None   Collection Time: 05/12/20  9:04 PM   Specimen: BLOOD  Result Value Ref Range Status   Specimen Description BLOOD SITE NOT SPECIFIED  Final   Special Requests   Final    BOTTLES DRAWN AEROBIC AND ANAEROBIC Blood Culture results may not be optimal due to an inadequate volume of blood received in culture bottles   Culture   Final    NO GROWTH 5 DAYS Performed at [facility], [place] [address] [place], [place] [address]    Report Status 05/17/2020 FINAL  Final  SARS Coronavirus 2 by RT PCR (hospital order, performed in [facility] hospital lab) Nasopharyngeal Nasopharyngeal Swab     Status: None   Collection Time: 05/12/20 10:18 PM   Specimen: Nasopharyngeal Swab  Result Value Ref Range Status   SARS Coronavirus 2 NEGATIVE NEGATIVE Final    Comment: (NOTE) SARS-CoV-2 target nucleic acids are NOT DETECTED. The SARS-CoV-2 RNA is generally detectable in upper and lower respiratory specimens during the acute phase of  infection. The lowest concentration of SARS-CoV-2 viral copies this assay can detect is 250 copies / mL. A negative result does not preclude SARS-CoV-2 infection and should not be used as the sole basis for treatment or other patient management decisions.  A negative result may occur with improper specimen collection / handling, submission of specimen other than nasopharyngeal swab, presence of viral mutation(s) within the areas targeted by this assay, and inadequate number of viral copies (<250 copies / mL). A negative result must be combined with clinical observations, patient history, and epidemiological information. Fact Sheet for Patients:   StrictlyIdeas.no Fact Sheet for Healthcare Providers: BankingDealers.co.za This test is not yet approved or cleared  by the [place] FDA and has been authorized for detection and/or diagnosis of SARS-CoV-2 by FDA under an Emergency Use Authorization (EUA).  This EUA will remain in effect (meaning this test can be used) for the duration of the COVID-19 declaration under Section 564(b)(1) of the Act, 21 U.S.C. section 360bbb-3(b)(1), unless the authorization is terminated or revoked sooner. Performed at [facility], [place] [address], [place], [place] [address]   Culture, blood (routine x 2)     Status: None   Collection Time: 05/12/20 11:04 PM   Specimen: BLOOD  Result Value Ref Range Status   Specimen Description BLOOD RIGHT ANTECUBITAL  Final   Special Requests   Final    BOTTLES DRAWN AEROBIC AND ANAEROBIC Blood Culture adequate volume   Culture   Final    NO GROWTH 5 DAYS Performed at [facility], [place] [address], [place], [place] [address]    Report Status 05/17/2020 FINAL  Final     Time coordinating discharge: Over 40 minutes  SIGNED:   Charlynne Cousins, MD  [facility] 05/18/2020, 9:05 AM Pager   If 7PM-7AM, please contact  night-coverage [email or web address] Password [email or web address]

## 2020-05-18 NOTE — Progress Notes (Signed)
Per provider and CM, still waiting on bed availability for pt at SNF. Will swab pt for covid once CM notifies nurse.

## 2020-05-19 ENCOUNTER — Encounter (HOSPITAL_COMMUNITY): Payer: Self-pay | Admitting: Obstetrics and Gynecology

## 2020-05-19 LAB — SARS CORONAVIRUS 2 (TAT 6-24 HRS): SARS Coronavirus 2: NEGATIVE

## 2020-05-19 NOTE — Progress Notes (Signed)
PT Cancellation Note  Patient Details Name: Angelica Bell MRN: [id number] DOB: 1932/06/19   Cancelled Treatment:    Reason Eval/Treat Not Completed: (P) Other (comment)(Pt reports she is dealing with family drama and just wants to eat her supper and rest.  Pt participated with OT this pm, will f/u per POC.)   [facility] Eli Hose 05/19/2020, 5:27 PM Erasmo Leventhal , PTA Acute Rehabilitation Services Pager [phone] Office [phone]

## 2020-05-19 NOTE — Progress Notes (Signed)
Occupational Therapy Treatment Patient Details Name: Angelica Bell MRN: [id number] DOB: 04-05-1932 Today's Date: 05/19/2020    History of present illness Pt is an 86 yo female presenting after being found down at her home with low RR and SpO2 after a fall while transfering from her WC to bed. The pt also reports generally worsening debility for several months. PMH includes: chronic pain on daily opiods, osteoporosis, CVA, HTN, and frequent falls.   OT comments  Pt making steady progress towards OT goals this session. Pt continues to present with cognitive impairments, generalized weakness and decreased activity tolerance impacting pts ability to engage in ADLs. Pt found attempting to sit<>stand from [facility] after experiencing incontinent episode. Pt required [facility] for sit<>stand from [facility] and for functional mobility from BSC>recliner. Pt completes UB/LB bathing from recliner with supervision- MOD A overall needing most assist for LB ADLs. DC plan remains appropriate, will follow acutely per POC.    Follow Up Recommendations  SNF;Supervision/Assistance - 24 hour    Equipment Recommendations  3 in 1 bedside commode    Recommendations for Other Services      Precautions / Restrictions Precautions Precautions: Fall Restrictions Weight Bearing Restrictions: No       Mobility Bed Mobility               General bed mobility comments: pt found attempting to sit<>stand from [facility]  Transfers Overall transfer level: Needs assistance Equipment used: 1 person hand held assist Transfers: Sit to/from Stand Sit to Stand: Min assist         General transfer comment: MIN A to power up into standing from [facility] [address]    Balance Overall balance assessment: Needs assistance Sitting-balance support: No upper extremity supported;Feet supported Sitting balance-Leahy Scale: Fair     Standing balance support: Single extremity supported Standing balance-Leahy Scale: Poor Standing balance comment:  single UE support in standing                           ADL either performed or assessed with clinical judgement   ADL Overall ADL's : Needs assistance/impaired     Grooming: Wash/dry face;Sitting;Set up;Supervision/safety;Applying deodorant   Upper Body Bathing: Supervision/ safety;Set up;Sitting Upper Body Bathing Details (indicate cue type and reason): from long sitting in recliner Lower Body Bathing: Moderate assistance;Bed level;Supervison/ safety;Set up Lower Body Bathing Details (indicate cue type and reason): pt abel to bathe LB from knee up needing assist to reach below knee from long sitting in recliner Upper Body Dressing : Minimal assistance;Sitting Upper Body Dressing Details (indicate cue type and reason): to don new gown from recliner Lower Body Dressing: Total assistance;Bed level Lower Body Dressing Details (indicate cue type and reason): to don new socks from long sitting in recliner Toilet Transfer: Minimal assistance;Ambulation;Cueing for safety Toilet Transfer Details (indicate cue type and reason): pt found trying to sit<>stand from [facility] after incontinent episode; [place] for safety during ambulation as pt impulsively reaching out for rolling side table Toileting- Clothing Manipulation and Hygiene: Supervision/safety;Set up [place] Manipulation Details (indicate cue type and reason): pt completed pericare from recliner with set- up - supervision     Functional mobility during ADLs: Minimal assistance General ADL Comments: pt continues to present with pain, cognitive impairments and decreased activity. pt impulsive throughout session needing cues for safety awareness and overall judgement     Vision       Perception     Praxis  Cognition Arousal/Alertness: Awake/alert Behavior During Therapy: WFL for tasks assessed/performed Overall Cognitive Status: Impaired/Different from baseline Area of Impairment: Memory;Orientation;Following  commands;Safety/judgement;Awareness                 Orientation Level: Time(unaware of day)   Memory: Decreased short-term memory Following Commands: Follows one step commands inconsistently Safety/Judgement: Decreased awareness of safety;Decreased awareness of deficits Awareness: Intellectual   General Comments: pt with decreased insight into deficits needing max cues for safety awareness and overall judgment        Exercises     Shoulder Instructions       General Comments      Pertinent Vitals/ Pain       Pain Assessment: 0-10 Pain Score: 10-Worst pain ever Pain Location: abdomen Pain Descriptors / Indicators: Aching;Sore Pain Intervention(s): Monitored during session;Repositioned  Home Living                                          Prior Functioning/Environment              Frequency  Min 2X/week        Progress Toward Goals  OT Goals(current goals can now be found in the care plan section)  Progress towards OT goals: Progressing toward goals  Acute Rehab OT Goals Patient Stated Goal: to not be in pain OT Goal Formulation: With patient Time For Goal Achievement: 05/29/20 Potential to Achieve Goals: Good  Plan Discharge plan remains appropriate    Co-evaluation                 AM-PAC OT "6 Clicks" Daily Activity     Outcome Measure   Help from another person eating meals?: None Help from another person taking care of personal grooming?: A Little Help from another person toileting, which includes using toliet, bedpan, or urinal?: A Lot Help from another person bathing (including washing, rinsing, drying)?: A Little Help from another person to put on and taking off regular upper body clothing?: A Little Help from another person to put on and taking off regular lower body clothing?: A Little 6 Click Score: 18    End of Session    OT Visit Diagnosis: Unsteadiness on feet (R26.81);Other abnormalities of gait and  mobility (R26.89);Muscle weakness (generalized) (M62.81);Other symptoms and signs involving cognitive function;Pain   Activity Tolerance Patient tolerated treatment well   Patient Left in chair;with call bell/phone within reach;with chair alarm set   Nurse Communication          Time: [id number] OT Time Calculation (min): 23 min  Charges: OT General Charges $OT Visit: 1 Visit OT Treatments $Self Care/Home Management : 23-37 mins  Lanier Clam., COTA/L Acute Rehabilitation Services [phone]    Ihor Gully 05/19/2020, 1:13 PM

## 2020-05-19 NOTE — Progress Notes (Signed)
Initial Nutrition Assessment  RD working remotely.  DOCUMENTATION CODES:   Not applicable  INTERVENTION:   -MVI with minerals daily -Magic cup TID with meals, each supplement provides 290 kcal and 9 grams of protein -Hormel Shake TID with meals, each supplement provides 520 kcals and 22 grams protein  NUTRITION DIAGNOSIS:   Inadequate oral intake related to decreased appetite as evidenced by meal completion < 50%.  GOAL:   Patient will meet greater than or equal to 90% of their needs  MONITOR:   PO intake, Supplement acceptance, Labs, Weight trends, Skin, I & O's  REASON FOR ASSESSMENT:   Malnutrition Screening Tool    ASSESSMENT:   Angelica Bell is a 86 y.o. female with medical history significant for chronic pain on daily opioids, osteoporosis, cva, htn, who presents s/p fall. .  Pt admitted with opioid overdose and debility.   Reviewed I/O's: +352 ml x 24 hours and -400 ml since admission  UOP: 8 ml x 24 hours  Per psychiatry notes, pt does not have capacity to make medical decisions. Pt's grandson is POA.   Attempted to reach pt via room phone, however, no answer.   Reviewed meal completion records. Intake has declined within the past 24 hours (meal completion 25-50%). Noted meal completion of 75-100% earlier this week. Pt is on a regular diet, which will provide her with the widest variety of food selections.   Reviewed wt hx; pt has experienced a 14.7% wt loss over the past 3 months, which is significant for time frame. Highly suspect pt with malnutrition, however, unable to identify at this time.   Per MD notes, plan to discharge to [facility] once bed is available and insurance authorization is obtained.   Labs reviewed.   Diet Order:   Diet Order            Diet - low sodium heart healthy        Diet regular Room service appropriate? Yes; Fluid consistency: Thin  Diet effective now              EDUCATION NEEDS:   No education  needs have been identified at this time  Skin:  Skin Assessment: Reviewed RN Assessment  Last BM:  05/17/20  Height:   Ht Readings from Last 1 Encounters:  05/12/20 5' (1.524 m)    Weight:   Wt Readings from Last 1 Encounters:  05/12/20 44 kg    Ideal Body Weight:  45.5 kg  BMI:  Body mass index is 18.94 kg/m.  Estimated Nutritional Needs:   Kcal:  1300-1500  Protein:  60-75 grams  Fluid:  > 1.3 L    Loistine Chance, RD, LDN, [place] Registered Dietitian II Certified Diabetes Care and Education Specialist Please refer to [facility] for RD and/or RD on-call/weekend/after hours pager

## 2020-05-19 NOTE — TOC Transition Note (Signed)
Transition of Care [facility]) - CM/SW Discharge Note   Patient Details  Name: Angelica Bell MRN: [id number] Date of Birth: 16-Mar-1932  Transition of Care [facility]) CM/SW Contact:  Curlene Labrum, RN Phone Number: 05/19/2020, 9:24 AM   Clinical Narrative:    Case management spoke with Ginette Pitman, CM at [facility] [phone]) to check on insurance approval.  Insurance Authorization is still pending for patient's [employer] - but once approved - patient is medically clear to transfer.  [place] to call when approval is complete.  Will continue to follow.   Final next level of care: (home health versus SNF placement) Barriers to Discharge: Unsafe home situation, Continued Medical Work up, Family Issues   Patient Goals and CMS Choice Patient states their goals for this hospitalization and ongoing recovery are:: Patient wants to go home. CMS Medicare.gov Compare Post Acute Care list provided to:: Patient Choice offered to / list presented to : Patient  Discharge Placement         Discharge Plan and Services   Discharge Planning Services: CM Consult Post Acute Care Choice: Durable Medical Equipment          DME Arranged: (patient currently has WC and 3:1) DME Agency: NA       HH Arranged: Refused SNF          Social Determinants of Health (SDOH) Interventions     Readmission Risk Interventions Readmission Risk Prevention Plan 05/15/2020  Transportation Screening Complete  PCP or Specialist Appt within 3-5 Days Complete  HRI or [place] Complete  Social Work Consult for [place] Planning/Counseling Complete  Palliative Care Screening Complete  Medication Review Press photographer) Complete  Some recent data might be hidden

## 2020-05-19 NOTE — Progress Notes (Signed)
[facility] PROGRESS NOTE    Progress Note  Shadeja Reel  [id number] DOB: 1932/06/08 DOA: 05/12/2020 PCP: Heywood Bene, PA-C     Brief Narrative:   Harvest Blevens is an 86 y.o. female past medical history of chronic pain on chronic narcotics managed by PMNR, CVA, essential hypertension who presents to the ED after having a fall at home.  As per family patient has had progressive weakness over the past several months now wheelchair-bound.  She reports she was transferring from a wheelchair to the bed when she fell, denies hitting her head.  Grandson noticed her to be confused, per EMS report she was breathing about 4 times per minute satting 38% was given Narcan and had sudden significant improvement with her sats in the 80s.  Assessment/Plan:   Respiratory failure with hypoxia due to unintentional opioid overdose: No changes overnight she is stable continue current dose of oxycodone. Patient is medically stable for discharge awaiting skilled nursing facility placement.  Weakness/debilitated: Acute kidney injury: Likely prerenal resolved with IV fluid hydration.  Normocytic anemia: Hemoglobin at baseline.  Depression: Likely due to to Lexapro.  Overactive bladder: Continue a myrbetrip.  Chronic pain syndrome: On oxycodone p.o. 3 times a day and Neurontin at home. I will resume at a lower dose and as needed.  Essential hypertension: Pressure at goal with Norvasc.  Hypokalemia: Repleted orally now resolved.  Hypomagnesemia: Repleted orally.  DVT prophylaxis: lovenox Family Communication:HCPOA Status is: Inpatient  Remains inpatient appropriate because:Unsafe d/c plan   Dispo: The patient is from: Home              Anticipated d/c is to: SNF              Anticipated d/c date is: 1 day              Patient currently is medically stable to d/c.   Code Status:     Code Status Orders  (From admission, onward)         Start      Ordered   05/13/20 0059  Full code  Continuous     05/13/20 0059        Code Status History    Date Active Date Inactive Code Status Order ID Comments User Context   03/08/2020 [id number] 03/12/2020 1619 DNR [id number]  Reubin Milan, MD ED   02/05/2020 1632 02/08/2020 1851 DNR [id number]  Chauncey Mann, MD ED   08/31/2019 1217 09/15/2019 1847 Full Code [id number]  Candee Furbish, MD ED   07/26/2019 0639 08/07/2019 2128 Full Code [id number]  Jani Gravel, MD Inpatient   10/25/2017 2021 10/31/2017 2107 Full Code [id number]  Mercy Riding, MD Inpatient   02/16/2017 1835 02/19/2017 1633 DNR [id number]  Gwynne Edinger, MD Inpatient   12/25/2015 0111 12/27/2015 1917 Full Code [id number]  Danford, Suann Larry, MD Inpatient   Advance Care Planning Activity        IV Access:    Peripheral IV   Procedures and diagnostic studies:   No results found.   Medical Consultants:    None.  Anti-Infectives:   None  Subjective:    Montia Inge Rise no complaints.  Objective:    Vitals:   05/18/20 1438 05/18/20 2013 05/19/20 0526 05/19/20 0805  BP: (!) 149/84 136/71 132/68 (!) 160/81  Pulse: 80 79 82 74  Resp: 14 14 14 14   Temp: 98.2 F (36.8 C) 98.7 F (37.1 C) 98.6 F (37 C) 98.3  F (36.8 C)  TempSrc: Oral Oral Oral Oral  SpO2: 97% 96% 96% 95%  Weight:      Height:       SpO2: 95 % O2 Flow Rate (L/min): 2 L/min   Intake/Output Summary (Last 24 hours) at 05/19/2020 0846 Last data filed at 05/19/2020 0527 Gross per 24 hour  Intake 360 ml  Output 8 ml  Net 352 ml   Filed Weights   05/12/20 2005  Weight: 44 kg    Exam: General exam: In no acute distress. Respiratory system: Good air movement and clear to auscultation. Cardiovascular system: S1 & S2 heard, RRR. No JVD. Gastrointestinal system: Abdomen is nondistended, soft and nontender.  Extremities: No pedal edema.    Data Reviewed:    Labs: Basic Metabolic Panel: Recent Labs  Lab 05/12/20 2103 05/12/20 2103  05/14/20 0235 05/14/20 0235 05/15/20 [id number] 05/15/20 0718 05/16/20 0550 05/17/20 0310  NA 135  --  140  --  140  --  138 139  K 4.3   < > 4.0   < > 3.1*   < > 4.1 3.6  CL 102  --  112*  --  110  --  106 107  CO2 25  --  24  --  22  --  20* 23  GLUCOSE 126*  --  109*  --  106*  --  91 128*  BUN 21  --  15  --  11  --  9 15  CREATININE 1.59*  --  0.83  --  0.70  --  0.73 0.74  CALCIUM 9.1  --  9.1  --  8.7*  --  9.0 9.3  MG  --   --  1.6*  --  1.6*  --  2.2 1.8   < > = values in this interval not displayed.   GFR Estimated Creatinine Clearance: 35.1 mL/min (by C-G formula based on SCr of 0.74 mg/dL). Liver Function Tests: Recent Labs  Lab 05/12/20 2103  AST 24  ALT 15  ALKPHOS 64  BILITOT 0.7  PROT 5.3*  ALBUMIN 3.2*   No results for input(s): LIPASE, AMYLASE in the last 168 hours. No results for input(s): AMMONIA in the last 168 hours. Coagulation profile No results for input(s): INR, PROTIME in the last 168 hours. COVID-19 Labs  No results for input(s): DDIMER, FERRITIN, LDH, CRP in the last 72 hours.  Lab Results  Component Value Date   SARSCOV2NAA NEGATIVE 05/12/2020   [place] NEGATIVE 03/08/2020   [place] NEGATIVE 02/05/2020   SARSCOV2NAA NOT DETECTED 09/13/2019    CBC: Recent Labs  Lab 05/12/20 2103  WBC 11.7*  NEUTROABS 9.3*  HGB 10.9*  HCT 35.7*  MCV 91.8  PLT 195   Cardiac Enzymes: Recent Labs  Lab 05/12/20 2100  CKTOTAL 167   BNP (last 3 results) No results for input(s): PROBNP in the last 8760 hours. CBG: No results for input(s): GLUCAP in the last 168 hours. D-Dimer: No results for input(s): DDIMER in the last 72 hours. Hgb A1c: No results for input(s): HGBA1C in the last 72 hours. Lipid Profile: No results for input(s): CHOL, HDL, LDLCALC, TRIG, CHOLHDL, LDLDIRECT in the last 72 hours. Thyroid function studies: No results for input(s): TSH, T4TOTAL, T3FREE, THYROIDAB in the last 72 hours.  Invalid input(s): FREET3 Anemia  work up: No results for input(s): VITAMINB12, FOLATE, FERRITIN, TIBC, IRON, RETICCTPCT in the last 72 hours. Sepsis Labs: Recent Labs  Lab 05/12/20 2103 05/12/20 2311  WBC 11.7*  --  LATICACIDVEN 0.9 0.8   Microbiology Recent Results (from the past 240 hour(s))  Culture, blood (routine x 2)     Status: None   Collection Time: 05/12/20  9:04 PM   Specimen: BLOOD  Result Value Ref Range Status   Specimen Description BLOOD SITE NOT SPECIFIED  Final   Special Requests   Final    BOTTLES DRAWN AEROBIC AND ANAEROBIC Blood Culture results may not be optimal due to an inadequate volume of blood received in culture bottles   Culture   Final    NO GROWTH 5 DAYS Performed at [facility], [place] [address], [place], [place] [address]    Report Status 05/17/2020 FINAL  Final  SARS Coronavirus 2 by RT PCR (hospital order, performed in [facility] hospital lab) Nasopharyngeal Nasopharyngeal Swab     Status: None   Collection Time: 05/12/20 10:18 PM   Specimen: Nasopharyngeal Swab  Result Value Ref Range Status   SARS Coronavirus 2 NEGATIVE NEGATIVE Final    Comment: (NOTE) SARS-CoV-2 target nucleic acids are NOT DETECTED. The SARS-CoV-2 RNA is generally detectable in upper and lower respiratory specimens during the acute phase of infection. The lowest concentration of SARS-CoV-2 viral copies this assay can detect is 250 copies / mL. A negative result does not preclude SARS-CoV-2 infection and should not be used as the sole basis for treatment or other patient management decisions.  A negative result may occur with improper specimen collection / handling, submission of specimen other than nasopharyngeal swab, presence of viral mutation(s) within the areas targeted by this assay, and inadequate number of viral copies (<250 copies / mL). A negative result must be combined with clinical observations, patient history, and epidemiological information. Fact Sheet for Patients:     StrictlyIdeas.no Fact Sheet for Healthcare Providers: BankingDealers.co.za This test is not yet approved or cleared  by the [place] FDA and has been authorized for detection and/or diagnosis of SARS-CoV-2 by FDA under an Emergency Use Authorization (EUA).  This EUA will remain in effect (meaning this test can be used) for the duration of the COVID-19 declaration under Section 564(b)(1) of the Act, 21 U.S.C. section 360bbb-3(b)(1), unless the authorization is terminated or revoked sooner. Performed at [facility], [place] [address] [place], [place] [address]   Culture, blood (routine x 2)     Status: None   Collection Time: 05/12/20 11:04 PM   Specimen: BLOOD  Result Value Ref Range Status   Specimen Description BLOOD RIGHT ANTECUBITAL  Final   Special Requests   Final    BOTTLES DRAWN AEROBIC AND ANAEROBIC Blood Culture adequate volume   Culture   Final    NO GROWTH 5 DAYS Performed at [facility], [place] [address] [place], [place] [address]    Report Status 05/17/2020 FINAL  Final     Medications:   . amLODipine  2.5 mg Oral Daily  . aspirin EC  81 mg Oral Daily  . escitalopram  20 mg Oral Daily  . heparin  5,000 Units Subcutaneous Q8H  . mirabegron ER  25 mg Oral Daily  . potassium chloride  20 mEq Oral BID   Continuous Infusions:    LOS: 6 days   Charlynne Cousins  [facility]  05/19/2020, 8:46 AM

## 2020-05-19 NOTE — Plan of Care (Signed)

## 2020-05-19 NOTE — Progress Notes (Signed)
OT Cancellation Note  Patient Details Name: Angelica Bell MRN: [id number] DOB: [birth year]   Cancelled Treatment:    Reason Eval/Treat Not Completed: Patient declined, no reason specified;Other (comment) Attempted to see pt x2 with pt declining participation both times. Pt reports nausea and declined OOB mobility. Pt states, "I don't want to answer anymore of your questions today." Will check back as time allows for OT session.  Lanier Clam., COTA/L Acute Rehabilitation Services [phone]   Ihor Gully 05/19/2020, 9:24 AM

## 2020-05-20 NOTE — Progress Notes (Signed)
[facility] PROGRESS NOTE    Progress Note  Angelica Bell  [id number] DOB: [birth year] DOA: 05/12/2020 PCP: Heywood Bene, PA-C     Brief Narrative:   Angelica Bell is an 86 y.o. female past medical history of chronic pain on chronic narcotics managed by PMNR, CVA, essential hypertension who presents to the ED after having a fall at home.  As per family patient has had progressive weakness over the past several months now wheelchair-bound.  She reports she was transferring from a wheelchair to the bed when she fell, denies hitting her head.  Grandson noticed her to be confused, per EMS report she was breathing about 4 times per minute satting 38% was given Narcan and had sudden significant improvement with her sats in the 80s.  Assessment/Plan:   Respiratory failure with hypoxia due to unintentional opioid overdose: No changes overnight she is stable continue current dose of oxycodone. Patient is medically stable for discharge awaiting skilled nursing facility placement.  Weakness/debilitated: Acute kidney injury: Likely prerenal resolved with IV fluid hydration.  Normocytic anemia: Hemoglobin at baseline.  Depression: Likely due to to Lexapro.  Overactive bladder: Continue a myrbetrip.  Chronic pain syndrome: On oxycodone p.o. 3 times a day and Neurontin at home. I will resume at a lower dose and as needed.  Essential hypertension: Pressure at goal with Norvasc.  Hypokalemia: Repleted orally now resolved.  Hypomagnesemia: Repleted orally.  DVT prophylaxis: lovenox Family Communication:HCPOA Status is: Inpatient  Remains inpatient appropriate because:Unsafe d/c plan   Dispo: The patient is from: Home              Anticipated d/c is to: SNF              Anticipated d/c date is: 1 day              Patient currently is medically stable to d/c.   Code Status:     Code Status Orders  (From admission, onward)         Start      Ordered   05/13/20 0059  Full code  Continuous     05/13/20 0059        Code Status History    Date Active Date Inactive Code Status Order ID Comments User Context   03/08/2020 [id number] 03/12/2020 1619 DNR [id number]  Reubin Milan, MD ED   02/05/2020 1632 02/08/2020 1851 DNR [id number]  Chauncey Mann, MD ED   08/31/2019 1217 09/15/2019 1847 Full Code [id number]  Candee Furbish, MD ED   07/26/2019 0639 08/07/2019 2128 Full Code [id number]  Jani Gravel, MD Inpatient   10/25/2017 2021 10/31/2017 2107 Full Code [id number]  Mercy Riding, MD Inpatient   02/16/2017 1835 02/19/2017 1633 DNR [id number]  Gwynne Edinger, MD Inpatient   12/25/2015 0111 12/27/2015 1917 Full Code [id number]  Danford, Suann Larry, MD Inpatient   Advance Care Planning Activity        IV Access:    Peripheral IV   Procedures and diagnostic studies:   No results found.   Medical Consultants:    None.  Anti-Infectives:   None  Subjective:    Angelica Bell no complaints.  Objective:    Vitals:   05/19/20 0805 05/19/20 1949 05/20/20 0411 05/20/20 0740  BP: (!) 160/81 (!) 146/88 (!) 189/82 131/82  Pulse: 74 86 80 71  Resp: 14 14 14 20   Temp: 98.3 F (36.8 C) 98.3 F (36.8 C) 97.7 F (36.5 C)  98 F (36.7 C)  TempSrc: Oral Oral    SpO2: 95% 96% 98% 98%  Weight:      Height:       SpO2: 98 % O2 Flow Rate (L/min): 2 L/min  No intake or output data in the 24 hours ending 05/20/20 0803 Filed Weights   05/12/20 2005  Weight: 44 kg    Exam: General exam: In no acute distress. Respiratory system: Good air movement and clear to auscultation. Cardiovascular system: S1 & S2 heard, RRR. No JVD. Gastrointestinal system: Abdomen is nondistended, soft and nontender.  Extremities: No pedal edema.    Data Reviewed:    Labs: Basic Metabolic Panel: Recent Labs  Lab 05/14/20 0235 05/14/20 0235 05/15/20 0718 05/15/20 0718 05/16/20 0550 05/17/20 0310  NA 140  --  140  --  138 139  K 4.0    < > 3.1*   < > 4.1 3.6  CL 112*  --  110  --  106 107  CO2 24  --  22  --  20* 23  GLUCOSE 109*  --  106*  --  91 128*  BUN 15  --  11  --  9 15  CREATININE 0.83  --  0.70  --  0.73 0.74  CALCIUM 9.1  --  8.7*  --  9.0 9.3  MG 1.6*  --  1.6*  --  2.2 1.8   < > = values in this interval not displayed.   GFR Estimated Creatinine Clearance: 35.1 mL/min (by C-G formula based on SCr of 0.74 mg/dL). Liver Function Tests: No results for input(s): AST, ALT, ALKPHOS, BILITOT, PROT, ALBUMIN in the last 168 hours. No results for input(s): LIPASE, AMYLASE in the last 168 hours. No results for input(s): AMMONIA in the last 168 hours. Coagulation profile No results for input(s): INR, PROTIME in the last 168 hours. COVID-19 Labs  No results for input(s): DDIMER, FERRITIN, LDH, CRP in the last 72 hours.  Lab Results  Component Value Date   SARSCOV2NAA NEGATIVE 05/19/2020   [place] NEGATIVE 05/12/2020   [place] NEGATIVE 03/08/2020   [place] NEGATIVE 02/05/2020    CBC: No results for input(s): WBC, NEUTROABS, HGB, HCT, MCV, PLT in the last 168 hours. Cardiac Enzymes: No results for input(s): CKTOTAL, CKMB, CKMBINDEX, TROPONINI in the last 168 hours. BNP (last 3 results) No results for input(s): PROBNP in the last 8760 hours. CBG: No results for input(s): GLUCAP in the last 168 hours. D-Dimer: No results for input(s): DDIMER in the last 72 hours. Hgb A1c: No results for input(s): HGBA1C in the last 72 hours. Lipid Profile: No results for input(s): CHOL, HDL, LDLCALC, TRIG, CHOLHDL, LDLDIRECT in the last 72 hours. Thyroid function studies: No results for input(s): TSH, T4TOTAL, T3FREE, THYROIDAB in the last 72 hours.  Invalid input(s): FREET3 Anemia work up: No results for input(s): VITAMINB12, FOLATE, FERRITIN, TIBC, IRON, RETICCTPCT in the last 72 hours. Sepsis Labs: No results for input(s): PROCALCITON, WBC, LATICACIDVEN in the last 168 hours. Microbiology Recent  Results (from the past 240 hour(s))  Culture, blood (routine x 2)     Status: None   Collection Time: 05/12/20  9:04 PM   Specimen: BLOOD  Result Value Ref Range Status   Specimen Description BLOOD SITE NOT SPECIFIED  Final   Special Requests   Final    BOTTLES DRAWN AEROBIC AND ANAEROBIC Blood Culture results may not be optimal due to an inadequate volume of blood received in culture bottles  Culture   Final    NO GROWTH 5 DAYS Performed at [facility], [place] [address], [place], [place] [address]    Report Status 05/17/2020 FINAL  Final  SARS Coronavirus 2 by RT PCR (hospital order, performed in [facility] hospital lab) Nasopharyngeal Nasopharyngeal Swab     Status: None   Collection Time: 05/12/20 10:18 PM   Specimen: Nasopharyngeal Swab  Result Value Ref Range Status   SARS Coronavirus 2 NEGATIVE NEGATIVE Final    Comment: (NOTE) SARS-CoV-2 target nucleic acids are NOT DETECTED. The SARS-CoV-2 RNA is generally detectable in upper and lower respiratory specimens during the acute phase of infection. The lowest concentration of SARS-CoV-2 viral copies this assay can detect is 250 copies / mL. A negative result does not preclude SARS-CoV-2 infection and should not be used as the sole basis for treatment or other patient management decisions.  A negative result may occur with improper specimen collection / handling, submission of specimen other than nasopharyngeal swab, presence of viral mutation(s) within the areas targeted by this assay, and inadequate number of viral copies (<250 copies / mL). A negative result must be combined with clinical observations, patient history, and epidemiological information. Fact Sheet for Patients:   StrictlyIdeas.no Fact Sheet for Healthcare Providers: BankingDealers.co.za This test is not yet approved or cleared  by the [place] FDA and has been authorized for detection and/or diagnosis of  SARS-CoV-2 by FDA under an Emergency Use Authorization (EUA).  This EUA will remain in effect (meaning this test can be used) for the duration of the COVID-19 declaration under Section 564(b)(1) of the Act, 21 U.S.C. section 360bbb-3(b)(1), unless the authorization is terminated or revoked sooner. Performed at [facility], [place] [address], [place], [place] [address]   Culture, blood (routine x 2)     Status: None   Collection Time: 05/12/20 11:04 PM   Specimen: BLOOD  Result Value Ref Range Status   Specimen Description BLOOD RIGHT ANTECUBITAL  Final   Special Requests   Final    BOTTLES DRAWN AEROBIC AND ANAEROBIC Blood Culture adequate volume   Culture   Final    NO GROWTH 5 DAYS Performed at [facility], [place] [address] [place], [place] [address]    Report Status 05/17/2020 FINAL  Final  SARS CORONAVIRUS 2 (TAT 6-24 HRS) Nasopharyngeal Nasopharyngeal Swab     Status: None   Collection Time: 05/19/20  6:21 AM   Specimen: Nasopharyngeal Swab  Result Value Ref Range Status   SARS Coronavirus 2 NEGATIVE NEGATIVE Final    Comment: (NOTE) SARS-CoV-2 target nucleic acids are NOT DETECTED. The SARS-CoV-2 RNA is generally detectable in upper and lower respiratory specimens during the acute phase of infection. Negative results do not preclude SARS-CoV-2 infection, do not rule out co-infections with other pathogens, and should not be used as the sole basis for treatment or other patient management decisions. Negative results must be combined with clinical observations, patient history, and epidemiological information. The expected result is Negative. Fact Sheet for Patients: SugarRoll.be Fact Sheet for Healthcare Providers: https://www.woods-mathews.com/ This test is not yet approved or cleared by the [place] FDA and  has been authorized for detection and/or diagnosis of SARS-CoV-2 by FDA under an Emergency Use Authorization  (EUA). This EUA will remain  in effect (meaning this test can be used) for the duration of the COVID-19 declaration under Section 56 4(b)(1) of the Act, 21 U.S.C. section 360bbb-3(b)(1), unless the authorization is terminated or  revoked sooner. Performed at [facility], [place] [address], [place], [place] [address]      Medications:   . amLODipine  2.5 mg Oral Daily  . aspirin EC  81 mg Oral Daily  . escitalopram  20 mg Oral Daily  . heparin  5,000 Units Subcutaneous Q8H  . mirabegron ER  25 mg Oral Daily  . potassium chloride  20 mEq Oral BID   Continuous Infusions:    LOS: 7 days   Charlynne Cousins  [facility]  05/20/2020, 8:03 AM

## 2020-05-20 NOTE — Plan of Care (Signed)
  Problem: Education: Goal: Knowledge of General Education information will improve Description: Including pain rating scale, medication(s)/side effects and non-pharmacologic comfort measures Outcome: Progressing   Problem: Safety: Goal: Ability to remain free from injury will improve Outcome: Progressing   

## 2020-05-21 MED ORDER — GABAPENTIN 100 MG PO CAPS
100.0000 mg | ORAL_CAPSULE | Freq: Three times a day (TID) | ORAL | Status: DC | PRN
  Administered 2020-05-22: 100 mg via ORAL
  Filled 2020-05-21: qty 1

## 2020-05-21 NOTE — Progress Notes (Signed)
Notified X. Blount that the orders for prn gabapentin needs to be clarified. We need to know how often the patient can have gabapentin prn. RN will continue to monitor.

## 2020-05-21 NOTE — Plan of Care (Signed)
  Problem: Pain Managment: Goal: General experience of comfort will improve Outcome: Progressing   Problem: Safety: Goal: Ability to remain free from injury will improve Outcome: Progressing   Problem: Skin Integrity: Goal: Risk for impaired skin integrity will decrease Outcome: Progressing   

## 2020-05-21 NOTE — Progress Notes (Signed)
Notified X.Blount that clarification was needed on the order for gabapentin for how often this prn  medication can be given.

## 2020-05-21 NOTE — Plan of Care (Signed)

## 2020-05-21 NOTE — Progress Notes (Signed)
[facility] PROGRESS NOTE    Progress Note  Angelica Bell  [id number] DOB: Nov 27, 1932 DOA: 05/12/2020 PCP: Heywood Bene, PA-C     Brief Narrative:   Angelica Bell is an 86 y.o. female past medical history of chronic pain on chronic narcotics managed by PMNR, CVA, essential hypertension who presents to the ED after having a fall at home.  As per family patient has had progressive weakness over the past several months now wheelchair-bound.  She reports she was transferring from a wheelchair to the bed when she fell, denies hitting her head.  Grandson noticed her to be confused, per EMS report she was breathing about 4 times per minute satting 38% was given Narcan and had sudden significant improvement with her sats in the 80s.  Assessment/Plan:   Respiratory failure with hypoxia due to unintentional opioid overdose: No changes overnight she is stable continue current dose of oxycodone. Patient is medically stable for discharge awaiting skilled nursing facility placement.  Weakness/debilitated: Acute kidney injury: Likely prerenal resolved with IV fluid hydration.  Normocytic anemia: Hemoglobin at baseline.  Depression: Likely due to to Lexapro.  Overactive bladder: Continue a myrbetrip.  Chronic pain syndrome: On oxycodone p.o. 3 times a day and Neurontin at home. I will resume at a lower dose and as needed.  Essential hypertension: Pressure at goal with Norvasc.  Hypokalemia: Repleted orally now resolved.  Hypomagnesemia: Repleted orally.  DVT prophylaxis: lovenox Family Communication:HCPOA Status is: Inpatient  Remains inpatient appropriate because:Unsafe d/c plan   Dispo: The patient is from: Home              Anticipated d/c is to: SNF              Anticipated d/c date is: 1 day              Patient currently is medically stable to d/c.   Code Status:     Code Status Orders  (From admission, onward)         Start      Ordered   05/13/20 0059  Full code  Continuous     05/13/20 0059        Code Status History    Date Active Date Inactive Code Status Order ID Comments User Context   03/08/2020 [id number] 03/12/2020 1619 DNR [id number]  Reubin Milan, MD ED   02/05/2020 1632 02/08/2020 1851 DNR [id number]  Chauncey Mann, MD ED   08/31/2019 1217 09/15/2019 1847 Full Code [id number]  Candee Furbish, MD ED   07/26/2019 0639 08/07/2019 2128 Full Code [id number]  Jani Gravel, MD Inpatient   10/25/2017 2021 10/31/2017 2107 Full Code [id number]  Mercy Riding, MD Inpatient   02/16/2017 1835 02/19/2017 1633 DNR [id number]  Gwynne Edinger, MD Inpatient   12/25/2015 0111 12/27/2015 1917 Full Code [id number]  Danford, Suann Larry, MD Inpatient   Advance Care Planning Activity        IV Access:    Peripheral IV   Procedures and diagnostic studies:   No results found.   Medical Consultants:    None.  Anti-Infectives:   None  Subjective:    Angelica Bell no complaints.  Objective:    Vitals:   05/20/20 1603 05/20/20 1951 05/21/20 0457 05/21/20 0816  BP: 137/79 113/72 (!) 149/88 (!) 150/85  Pulse: 90 94 (!) 110 98  Resp: 18 16 16 15   Temp: 97.9 F (36.6 C) 98.3 F (36.8 C) 98 F (36.7 C)  98.2 F (36.8 C)  TempSrc:  Oral Oral Oral  SpO2: 96% 97% 97% 98%  Weight:      Height:       SpO2: 98 % O2 Flow Rate (L/min): 2 L/min   Intake/Output Summary (Last 24 hours) at 05/21/2020 0857 Last data filed at 05/20/2020 1000 Gross per 24 hour  Intake 240 ml  Output 300 ml  Net -60 ml   Filed Weights   05/12/20 2005  Weight: 44 kg    Exam: General exam: In no acute distress. Respiratory system: Good air movement and clear to auscultation. Cardiovascular system: S1 & S2 heard, RRR. No JVD. Gastrointestinal system: Abdomen is nondistended, soft and nontender.  Extremities: No pedal edema.    Data Reviewed:    Labs: Basic Metabolic Panel: Recent Labs  Lab 05/15/20 0718  05/15/20 0718 05/16/20 0550 05/17/20 0310  NA 140  --  138 139  K 3.1*   < > 4.1 3.6  CL 110  --  106 107  CO2 22  --  20* 23  GLUCOSE 106*  --  91 128*  BUN 11  --  9 15  CREATININE 0.70  --  0.73 0.74  CALCIUM 8.7*  --  9.0 9.3  MG 1.6*  --  2.2 1.8   < > = values in this interval not displayed.   GFR Estimated Creatinine Clearance: 35.1 mL/min (by C-G formula based on SCr of 0.74 mg/dL). Liver Function Tests: No results for input(s): AST, ALT, ALKPHOS, BILITOT, PROT, ALBUMIN in the last 168 hours. No results for input(s): LIPASE, AMYLASE in the last 168 hours. No results for input(s): AMMONIA in the last 168 hours. Coagulation profile No results for input(s): INR, PROTIME in the last 168 hours. COVID-19 Labs  No results for input(s): DDIMER, FERRITIN, LDH, CRP in the last 72 hours.  Lab Results  Component Value Date   SARSCOV2NAA NEGATIVE 05/19/2020   [place] NEGATIVE 05/12/2020   [place] NEGATIVE 03/08/2020   [place] NEGATIVE 02/05/2020    CBC: No results for input(s): WBC, NEUTROABS, HGB, HCT, MCV, PLT in the last 168 hours. Cardiac Enzymes: No results for input(s): CKTOTAL, CKMB, CKMBINDEX, TROPONINI in the last 168 hours. BNP (last 3 results) No results for input(s): PROBNP in the last 8760 hours. CBG: No results for input(s): GLUCAP in the last 168 hours. D-Dimer: No results for input(s): DDIMER in the last 72 hours. Hgb A1c: No results for input(s): HGBA1C in the last 72 hours. Lipid Profile: No results for input(s): CHOL, HDL, LDLCALC, TRIG, CHOLHDL, LDLDIRECT in the last 72 hours. Thyroid function studies: No results for input(s): TSH, T4TOTAL, T3FREE, THYROIDAB in the last 72 hours.  Invalid input(s): FREET3 Anemia work up: No results for input(s): VITAMINB12, FOLATE, FERRITIN, TIBC, IRON, RETICCTPCT in the last 72 hours. Sepsis Labs: No results for input(s): PROCALCITON, WBC, LATICACIDVEN in the last 168 hours. Microbiology Recent  Results (from the past 240 hour(s))  Culture, blood (routine x 2)     Status: None   Collection Time: 05/12/20  9:04 PM   Specimen: BLOOD  Result Value Ref Range Status   Specimen Description BLOOD SITE NOT SPECIFIED  Final   Special Requests   Final    BOTTLES DRAWN AEROBIC AND ANAEROBIC Blood Culture results may not be optimal due to an inadequate volume of blood received in culture bottles   Culture   Final    NO GROWTH 5 DAYS Performed at [facility], [place]  [address], [place], [place] [address]    Report Status 05/17/2020 FINAL  Final  SARS Coronavirus 2 by RT PCR (hospital order, performed in [facility] hospital lab) Nasopharyngeal Nasopharyngeal Swab     Status: None   Collection Time: 05/12/20 10:18 PM   Specimen: Nasopharyngeal Swab  Result Value Ref Range Status   SARS Coronavirus 2 NEGATIVE NEGATIVE Final    Comment: (NOTE) SARS-CoV-2 target nucleic acids are NOT DETECTED. The SARS-CoV-2 RNA is generally detectable in upper and lower respiratory specimens during the acute phase of infection. The lowest concentration of SARS-CoV-2 viral copies this assay can detect is 250 copies / mL. A negative result does not preclude SARS-CoV-2 infection and should not be used as the sole basis for treatment or other patient management decisions.  A negative result may occur with improper specimen collection / handling, submission of specimen other than nasopharyngeal swab, presence of viral mutation(s) within the areas targeted by this assay, and inadequate number of viral copies (<250 copies / mL). A negative result must be combined with clinical observations, patient history, and epidemiological information. Fact Sheet for Patients:   StrictlyIdeas.no Fact Sheet for Healthcare Providers: BankingDealers.co.za This test is not yet approved or cleared  by the [place] FDA and has been authorized for detection and/or diagnosis of  SARS-CoV-2 by FDA under an Emergency Use Authorization (EUA).  This EUA will remain in effect (meaning this test can be used) for the duration of the COVID-19 declaration under Section 564(b)(1) of the Act, 21 U.S.C. section 360bbb-3(b)(1), unless the authorization is terminated or revoked sooner. Performed at [facility], [place] [address] [place], [place] [address]   Culture, blood (routine x 2)     Status: None   Collection Time: 05/12/20 11:04 PM   Specimen: BLOOD  Result Value Ref Range Status   Specimen Description BLOOD RIGHT ANTECUBITAL  Final   Special Requests   Final    BOTTLES DRAWN AEROBIC AND ANAEROBIC Blood Culture adequate volume   Culture   Final    NO GROWTH 5 DAYS Performed at [facility], [place] [address], [place], [place] [address]    Report Status 05/17/2020 FINAL  Final  SARS CORONAVIRUS 2 (TAT 6-24 HRS) Nasopharyngeal Nasopharyngeal Swab     Status: None   Collection Time: 05/19/20  6:21 AM   Specimen: Nasopharyngeal Swab  Result Value Ref Range Status   SARS Coronavirus 2 NEGATIVE NEGATIVE Final    Comment: (NOTE) SARS-CoV-2 target nucleic acids are NOT DETECTED. The SARS-CoV-2 RNA is generally detectable in upper and lower respiratory specimens during the acute phase of infection. Negative results do not preclude SARS-CoV-2 infection, do not rule out co-infections with other pathogens, and should not be used as the sole basis for treatment or other patient management decisions. Negative results must be combined with clinical observations, patient history, and epidemiological information. The expected result is Negative. Fact Sheet for Patients: SugarRoll.be Fact Sheet for Healthcare Providers: https://www.woods-mathews.com/ This test is not yet approved or cleared by the [place] FDA and  has been authorized for detection and/or diagnosis of SARS-CoV-2 by FDA under an Emergency Use Authorization  (EUA). This EUA will remain  in effect (meaning this test can be used) for the duration of the COVID-19 declaration under Section 56 4(b)(1) of the Act, 21 U.S.C. section 360bbb-3(b)(1), unless the authorization is terminated or revoked sooner. Performed at [facility], [place] [address], [place], [place] [address]  Medications:   . amLODipine  2.5 mg Oral Daily  . aspirin EC  81 mg Oral Daily  . escitalopram  20 mg Oral Daily  . heparin  5,000 Units Subcutaneous Q8H  . mirabegron ER  25 mg Oral Daily  . potassium chloride  20 mEq Oral BID   Continuous Infusions:    LOS: 8 days   Charlynne Cousins  [facility]  05/21/2020, 8:57 AM

## 2020-05-22 LAB — URINALYSIS, ROUTINE W REFLEX MICROSCOPIC
Bilirubin Urine: NEGATIVE
Glucose, UA: NEGATIVE mg/dL
Hgb urine dipstick: NEGATIVE
Ketones, ur: NEGATIVE mg/dL
Leukocytes,Ua: NEGATIVE
Nitrite: NEGATIVE
Protein, ur: NEGATIVE mg/dL
Specific Gravity, Urine: 1.01 (ref 1.005–1.030)
pH: 5 (ref 5.0–8.0)

## 2020-05-22 NOTE — Plan of Care (Signed)
  Problem: Education: Goal: Knowledge of General Education information will improve Description: Including pain rating scale, medication(s)/side effects and non-pharmacologic comfort measures Outcome: Completed/Met   Problem: Health Behavior/Discharge Planning: Goal: Ability to manage health-related needs will improve Outcome: Completed/Met   Problem: Clinical Measurements: Goal: Ability to maintain clinical measurements within normal limits will improve Outcome: Completed/Met Goal: Will remain free from infection Outcome: Completed/Met Goal: Diagnostic test results will improve Outcome: Completed/Met Goal: Respiratory complications will improve Outcome: Completed/Met Note: On room air Goal: Cardiovascular complication will be avoided Outcome: Completed/Met   Problem: Activity: Goal: Risk for activity intolerance will decrease Outcome: Completed/Met Note: Up & down to [facility] with one assist tolerating well   Problem: Nutrition: Goal: Adequate nutrition will be maintained Outcome: Completed/Met   Problem: Coping: Goal: Level of anxiety will decrease Outcome: Completed/Met   Problem: Elimination: Goal: Will not experience complications related to bowel motility Outcome: Completed/Met Goal: Will not experience complications related to urinary retention Outcome: Completed/Met   Problem: Pain Managment: Goal: General experience of comfort will improve Outcome: Completed/Met   Problem: Safety: Goal: Ability to remain free from injury will improve Outcome: Completed/Met   Problem: Skin Integrity: Goal: Risk for impaired skin integrity will decrease Outcome: Completed/Met

## 2020-05-22 NOTE — Progress Notes (Signed)
Pt discharging to [facility], report called to [place] at [facility]. IV removed. Pt dressed & her clothes, family is aware. [facility] here now to transport pt. No complaints noted.

## 2020-05-22 NOTE — TOC Transition Note (Addendum)
Transition of Care [facility]) - CM/SW Discharge Note   Patient Details  Name: Khiya Westhoff MRN: [id number] Date of Birth: February 17, 1932  Transition of Care [facility]) CM/SW Contact:  Curlene Labrum, RN Phone Number: 05/22/2020, 11:39 AM   Clinical Narrative:    Case management left a message with [facility] facility to inquire about insurance authorization approval and patient's possible transfer today.  Waiting on return call from Ginette Pitman, [place] liaison, with [facility] at [phone].  I placed Discharge Summary and all admission documents in the HUB for the facility to view for discharge.  05/22/20 1230- Spoke with Ginette Pitman, CM at [facility] and patient has insurance auth approval and bed availability.  Called [facility] and arranged for transport.  Dorothy Puffer,  called and notified.   Final next level of care: (home health versus SNF placement) Barriers to Discharge: Unsafe home situation, Continued Medical Work up, Family Issues   Patient Goals and CMS Choice Patient states their goals for this hospitalization and ongoing recovery are:: Patient wants to go home. CMS Medicare.gov Compare Post Acute Care list provided to:: Patient Choice offered to / list presented to : Patient  Discharge Placement                       Discharge Plan and Services   Discharge Planning Services: CM Consult Post Acute Care Choice: Durable Medical Equipment          DME Arranged: (patient currently has WC and 3:1) DME Agency: NA       HH Arranged: Refused SNF          Social Determinants of Health (SDOH) Interventions     Readmission Risk Interventions Readmission Risk Prevention Plan 05/15/2020  Transportation Screening Complete  PCP or Specialist Appt within 3-5 Days Complete  HRI or [place] Complete  Social Work Consult for [place] Planning/Counseling Complete  Palliative Care Screening Complete  Medication Review [employer]) Complete   Some recent data might be hidden

## 2020-05-22 NOTE — Discharge Summary (Signed)
Physician Discharge Summary  Pattiann Scipio [id number] DOB: [birth year] DOA: 05/12/2020  PCP: Heywood Bene, PA-C  Admit date: 05/12/2020 Discharge date: 05/19/2020  Admitted From: Home Disposition:  SNF  Recommendations for Outpatient Follow-up:  1. Follow up with PCP in 1-2 weeks 2. Palliative care to meet at facility discuss goals of care and DNR status.   Home Health:No Equipment/Devices:None   Discharge Condition:Stable CODE STATUS:Full Diet recommendation: Heart Healthy   Brief/Interim Summary: 86 y.o. female past medical history of chronic pain on chronic narcotics managed by PMNR, CVA, essential hypertension who presents to the ED after having a fall at home.  As per family patient has had progressive weakness over the past several months now wheelchair-bound.  She reports she was transferring from a wheelchair to the bed when she fell, denies hitting her head.  Grandson noticed her to be confused, per EMS report she was breathing about 4 times per minute satting 38% was given Narcan and had sudden significant improvement with her sats in the 80s  Discharge Diagnoses:  Active Problems:   ANXIETY DEPRESSION   Chronic pain   AKI (acute kidney injury) ([place])   Opioid overdose (HCC)   Acute respiratory failure with hypoxia (HCC)  Acute respiratory failure with hypoxia due to unintentional opioid overdose: CT scan of the head and C-spine were unremarkable, EMS gave Narcan home and she immediately responded. When she was more awake she was complaining of pain and her oxycodone was resumed as needed. Psych evaluated the patient and deemed her not to have capacity to make medical decisions. Her grandson who she relates his next of kin's is making her decisions.  Weakness/debilitated: Physical therapy evaluated the patient recommended skilled nursing facility. As she was refusing skilled nursing facility psych was consulted and deemed her not to have capacity to make  medical decisions.   Acute kidney injury: Likely prerenal azotemia resolved with IV fluid hydration.  Normocytic anemia: No signs of overt bleeding hemoglobin at baseline.  Depression: There have to be evaluated as an outpatient, her Lexapro was DC'd she remained in a good mood tolerating her diet with no anhedonia.  Chronic pain syndrome: Continue Neurontin and Oxley as needed  Essential hypertension: We will control blood pressure goal with low-dose Norvasc.  Electrolyte imbalance hypokalemia/hypomagnesemia: Repleted now resolved.   Discharge Instructions  Discharge Instructions    Diet - low sodium heart healthy   Complete by: As directed    Diet - low sodium heart healthy   Complete by: As directed    Increase activity slowly   Complete by: As directed    Increase activity slowly   Complete by: As directed      Allergies as of 05/22/2020      Reactions   Hctz [hydrochlorothiazide]    Significant and rapid hyponatremia (TIH)   Amoxicillin-pot Clavulanate Hives, Diarrhea   Has patient had a PCN reaction causing immediate rash, facial/tongue/throat swelling, SOB or lightheadedness with hypotension: yes Has patient had a PCN reaction causing severe rash involving mucus membranes or skin necrosis: yes Has patient had a PCN reaction that required hospitalization : unknown Has patient had a PCN reaction occurring within the last 10 years: unknown If all of the above answers are "NO", then may proceed with Cephalosporin use.   Lisinopril-hydrochlorothiazide Other (See Comments)   Tired- no energy to do anything..   Sulfa Antibiotics Hives, Nausea And Vomiting   Sulfonamide Derivatives Hives      Medication List  STOP taking these medications   atorvastatin 10 MG tablet Commonly known as: LIPITOR   methocarbamol 500 MG tablet Commonly known as: ROBAXIN   oxycodone 30 MG immediate release tablet Commonly known as: ROXICODONE   Oxycodone HCl 10 MG Tabs      TAKE these medications   acetaminophen 500 MG tablet Commonly known as: TYLENOL Take 500 mg by mouth every 6 (six) hours as needed.   amLODipine 2.5 MG tablet Commonly known as: NORVASC Take 1 tablet (2.5 mg total) by mouth daily.   aspirin 81 MG EC tablet Take 1 tablet (81 mg total) by mouth daily.   feeding supplement (ENSURE ENLIVE) Liqd Take 237 mLs by mouth 2 (two) times daily between meals.   gabapentin 100 MG capsule Commonly known as: NEURONTIN Take 1 capsule (100 mg total) by mouth 3 (three) times daily. What changed:   when to take this  reasons to take this   multivitamin with minerals Tabs tablet Take 1 tablet by mouth daily.     ASK your doctor about these medications   Oxycodone HCl 10 MG Tabs Take 3 tablets (30 mg total) by mouth 3 (three) times daily as needed for up to 3 days. Ask about: Should I take this medication?       Allergies  Allergen Reactions  . Hctz [Hydrochlorothiazide]     Significant and rapid hyponatremia (TIH)  . Amoxicillin-Pot Clavulanate Hives and Diarrhea    Has patient had a PCN reaction causing immediate rash, facial/tongue/throat swelling, SOB or lightheadedness with hypotension: yes Has patient had a PCN reaction causing severe rash involving mucus membranes or skin necrosis: yes Has patient had a PCN reaction that required hospitalization : unknown Has patient had a PCN reaction occurring within the last 10 years: unknown If all of the above answers are "NO", then may proceed with Cephalosporin use.   Marland Kitchen Lisinopril-Hydrochlorothiazide Other (See Comments)    Tired- no energy to do anything..  . Sulfa Antibiotics Hives and Nausea And Vomiting  . Sulfonamide Derivatives Hives    Consultations:  Psychiatry   Procedures/Studies: CT Abdomen Pelvis Wo Contrast  Result Date: 05/12/2020 CLINICAL DATA:  Recent trip and fall with abdominal pain, initial encounter EXAM: CT ABDOMEN AND PELVIS WITHOUT CONTRAST TECHNIQUE:  Multidetector CT imaging of the abdomen and pelvis was performed following the standard protocol without IV contrast. COMPARISON:  03/08/2020 FINDINGS: Lower chest: Lung bases again demonstrate some mild scarring stable from the prior exam. Hiatal hernia is again Hepatobiliary: No focal liver abnormality is seen. No gallstones, gallbladder wall thickening, or biliary dilatation. Pancreas: Unremarkable. No pancreatic ductal dilatation or surrounding inflammatory changes. Spleen: Normal in size without focal abnormality. Adrenals/Urinary Tract: Adrenal glands are within normal limits. Kidneys demonstrate no renal calculi or obstructive changes. Bladder is well distended. Stomach/Bowel: Diverticular change of the colon is noted without evidence of diverticulitis. No obstructive or inflammatory changes are noted. The appendix is not well visualized consistent with prior surgical excision. Small bowel is unremarkable. No gastric abnormality is noted with the exception of the previously mentioned hiatal hernia. Vascular/Lymphatic: Aortic calcifications are noted. No significant lymphadenopathy is noted. Left-sided IVC is noted and stable. Reproductive: Status post hysterectomy. No adnexal masses. Other: No abdominal wall hernia or abnormality. No abdominopelvic ascites. Musculoskeletal: Right hip replacement is seen. Surgical fixation of the proximal left hip is noted. Degenerative changes of the lumbar spine are seen. Scoliosis concave to the right is again noted. No acute abnormality is noted. IMPRESSION: Chronic changes  as described above without acute abnormality. Electronically Signed   By: Inez Catalina M.D.   On: 05/12/2020 22:57   DG Chest 1 View  Result Date: 05/12/2020 CLINICAL DATA:  Pain after fall. EXAM: CHEST  1 VIEW COMPARISON:  Frontal and lateral views 03/30/2020. Chest CT 03/08/2020 FINDINGS: Normal heart size. Unchanged mediastinal contours with aortic atherosclerosis. No focal airspace disease. No  pneumothorax or pleural effusion. Healing right rib fractures again seen. Remote left proximal humerus fracture. No acute osseous abnormalities are seen on this single frontal view. Scoliotic curvature of the spine. IMPRESSION: No acute finding. Electronically Signed   By: Keith Rake M.D.   On: 05/12/2020 20:31   CT Head Wo Contrast  Result Date: 05/12/2020 CLINICAL DATA:  Recent fall EXAM: CT HEAD WITHOUT CONTRAST CT CERVICAL SPINE WITHOUT CONTRAST TECHNIQUE: Multidetector CT imaging of the head and cervical spine was performed following the standard protocol without intravenous contrast. Multiplanar CT image reconstructions of the cervical spine were also generated. COMPARISON:  03/08/2020 FINDINGS: CT HEAD FINDINGS Brain: Chronic atrophic and ischemic changes are noted. No findings to suggest acute hemorrhage, acute infarction or space-occupying mass lesion are seen. Vascular: No hyperdense vessel or unexpected calcification. Skull: Normal. Negative for fracture or focal lesion. Sinuses/Orbits: No acute finding. Other: None. CT CERVICAL SPINE FINDINGS Alignment: Within normal limits. Skull base and vertebrae: 7 cervical segments are well visualized. Vertebral body height is well maintained. Mild disc space narrowing is noted from C4-C7 with associated osteophytic changes. Mild facet hypertrophic changes are seen. The odontoid is within normal limits. No findings to suggest acute fracture or acute facet abnormality are noted. Soft tissues and spinal canal: Surrounding soft tissue structures show significant vascular calcifications. The thyroid demonstrates multiple hypodense nodules. These are stable in appearance from a prior CT from 2018 and felt to be benign in etiology. Upper chest: Visualized lung apices are within normal limits. Other: None IMPRESSION: CT of the head: Chronic atrophic and ischemic changes without acute abnormality. CT of the cervical spine: Multilevel degenerative change without  acute abnormality. Multiple hypodense nodules throughout the thyroid gland stable from 2018. No followup recommended (ref: J Am Coll Radiol. 2015 Feb;12(2): 143-50). Electronically Signed   By: Inez Catalina M.D.   On: 05/12/2020 20:23   CT Cervical Spine Wo Contrast  Result Date: 05/12/2020 CLINICAL DATA:  Recent fall EXAM: CT HEAD WITHOUT CONTRAST CT CERVICAL SPINE WITHOUT CONTRAST TECHNIQUE: Multidetector CT imaging of the head and cervical spine was performed following the standard protocol without intravenous contrast. Multiplanar CT image reconstructions of the cervical spine were also generated. COMPARISON:  03/08/2020 FINDINGS: CT HEAD FINDINGS Brain: Chronic atrophic and ischemic changes are noted. No findings to suggest acute hemorrhage, acute infarction or space-occupying mass lesion are seen. Vascular: No hyperdense vessel or unexpected calcification. Skull: Normal. Negative for fracture or focal lesion. Sinuses/Orbits: No acute finding. Other: None. CT CERVICAL SPINE FINDINGS Alignment: Within normal limits. Skull base and vertebrae: 7 cervical segments are well visualized. Vertebral body height is well maintained. Mild disc space narrowing is noted from C4-C7 with associated osteophytic changes. Mild facet hypertrophic changes are seen. The odontoid is within normal limits. No findings to suggest acute fracture or acute facet abnormality are noted. Soft tissues and spinal canal: Surrounding soft tissue structures show significant vascular calcifications. The thyroid demonstrates multiple hypodense nodules. These are stable in appearance from a prior CT from 2018 and felt to be benign in etiology. Upper chest: Visualized lung apices are within normal  limits. Other: None IMPRESSION: CT of the head: Chronic atrophic and ischemic changes without acute abnormality. CT of the cervical spine: Multilevel degenerative change without acute abnormality. Multiple hypodense nodules throughout the thyroid gland  stable from 2018. No followup recommended (ref: J Am Coll Radiol. 2015 Feb;12(2): 143-50). Electronically Signed   By: Inez Catalina M.D.   On: 05/12/2020 20:23   DG Hips Bilat W or Wo Pelvis 3-4 Views  Result Date: 05/12/2020 CLINICAL DATA:  Pain after fall. EXAM: DG HIP (WITH OR WITHOUT PELVIS) 3-4V BILAT COMPARISON:  Hip radiograph 02/05/2020, right hip CT same day. FINDINGS: Right hip arthroplasty in expected alignment. No periprosthetic lucency or fracture. Intramedullary rod with locking and trans trochanteric screw in the left proximal femur. No periprosthetic lucency or fracture. No evidence of pelvic ring or pubic rami fracture. Degenerative change of the pubic symphysis and sacroiliac joints. Scoliotic curvature of the included lower lumbar spine. IMPRESSION: 1. No acute fracture of the pelvis or hips. 2. Intact right hip arthroplasty and surgical hardware in the left proximal femur. No hardware complication or periprosthetic fracture. Electronically Signed   By: Keith Rake M.D.   On: 05/12/2020 20:35     Subjective: No complaints.  Discharge Exam: Vitals:   05/22/20 0246 05/22/20 0753  BP: 138/67 (!) 147/80  Pulse: 88 86  Resp: 16 14  Temp: 98.2 F (36.8 C) 98.3 F (36.8 C)  SpO2: 97% 96%   Vitals:   05/21/20 1343 05/21/20 1928 05/22/20 0246 05/22/20 0753  BP: 121/70 107/60 138/67 (!) 147/80  Pulse: 85 94 88 86  Resp: 16 18 16 14   Temp: 98.9 F (37.2 C) 97.8 F (36.6 C) 98.2 F (36.8 C) 98.3 F (36.8 C)  TempSrc: Oral Oral Oral Oral  SpO2: 97% 97% 97% 96%  Weight:      Height:        General: Pt is alert, awake, not in acute distress Cardiovascular: RRR, S1/S2 +, no rubs, no gallops Respiratory: CTA bilaterally, no wheezing, no rhonchi Abdominal: Soft, NT, ND, bowel sounds + Extremities: no edema, no cyanosis    The results of significant diagnostics from this hospitalization (including imaging, microbiology, ancillary and laboratory) are listed below for  reference.     Microbiology: Recent Results (from the past 240 hour(s))  Culture, blood (routine x 2)     Status: None   Collection Time: 05/12/20  9:04 PM   Specimen: BLOOD  Result Value Ref Range Status   Specimen Description BLOOD SITE NOT SPECIFIED  Final   Special Requests   Final    BOTTLES DRAWN AEROBIC AND ANAEROBIC Blood Culture results may not be optimal due to an inadequate volume of blood received in culture bottles   Culture   Final    NO GROWTH 5 DAYS Performed at [facility], [place] [address], [place], [place] [address]    Report Status 05/17/2020 FINAL  Final  SARS Coronavirus 2 by RT PCR (hospital order, performed in [facility] hospital lab) Nasopharyngeal Nasopharyngeal Swab     Status: None   Collection Time: 05/12/20 10:18 PM   Specimen: Nasopharyngeal Swab  Result Value Ref Range Status   SARS Coronavirus 2 NEGATIVE NEGATIVE Final    Comment: (NOTE) SARS-CoV-2 target nucleic acids are NOT DETECTED. The SARS-CoV-2 RNA is generally detectable in upper and lower respiratory specimens during the acute phase of infection. The lowest concentration of SARS-CoV-2 viral copies this assay can detect is 250 copies / mL. A negative  result does not preclude SARS-CoV-2 infection and should not be used as the sole basis for treatment or other patient management decisions.  A negative result may occur with improper specimen collection / handling, submission of specimen other than nasopharyngeal swab, presence of viral mutation(s) within the areas targeted by this assay, and inadequate number of viral copies (<250 copies / mL). A negative result must be combined with clinical observations, patient history, and epidemiological information. Fact Sheet for Patients:   StrictlyIdeas.no Fact Sheet for Healthcare Providers: BankingDealers.co.za This test is not yet approved or cleared  by the [place] FDA and has been  authorized for detection and/or diagnosis of SARS-CoV-2 by FDA under an Emergency Use Authorization (EUA).  This EUA will remain in effect (meaning this test can be used) for the duration of the COVID-19 declaration under Section 564(b)(1) of the Act, 21 U.S.C. section 360bbb-3(b)(1), unless the authorization is terminated or revoked sooner. Performed at [facility], [place] [address] [place], [place] [address]   Culture, blood (routine x 2)     Status: None   Collection Time: 05/12/20 11:04 PM   Specimen: BLOOD  Result Value Ref Range Status   Specimen Description BLOOD RIGHT ANTECUBITAL  Final   Special Requests   Final    BOTTLES DRAWN AEROBIC AND ANAEROBIC Blood Culture adequate volume   Culture   Final    NO GROWTH 5 DAYS Performed at [facility], [place] [address], [place], [place] [address]    Report Status 05/17/2020 FINAL  Final  SARS CORONAVIRUS 2 (TAT 6-24 HRS) Nasopharyngeal Nasopharyngeal Swab     Status: None   Collection Time: 05/19/20  6:21 AM   Specimen: Nasopharyngeal Swab  Result Value Ref Range Status   SARS Coronavirus 2 NEGATIVE NEGATIVE Final    Comment: (NOTE) SARS-CoV-2 target nucleic acids are NOT DETECTED. The SARS-CoV-2 RNA is generally detectable in upper and lower respiratory specimens during the acute phase of infection. Negative results do not preclude SARS-CoV-2 infection, do not rule out co-infections with other pathogens, and should not be used as the sole basis for treatment or other patient management decisions. Negative results must be combined with clinical observations, patient history, and epidemiological information. The expected result is Negative. Fact Sheet for Patients: SugarRoll.be Fact Sheet for Healthcare Providers: https://www.woods-mathews.com/ This test is not yet approved or cleared by the [place] FDA and  has been authorized for detection and/or diagnosis of SARS-CoV-2  by FDA under an Emergency Use Authorization (EUA). This EUA will remain  in effect (meaning this test can be used) for the duration of the COVID-19 declaration under Section 56 4(b)(1) of the Act, 21 U.S.C. section 360bbb-3(b)(1), unless the authorization is terminated or revoked sooner. Performed at [facility], [place] [address], [place], [place] [address]      Labs: BNP (last 3 results) No results for input(s): BNP in the last 8760 hours. Basic Metabolic Panel: Recent Labs  Lab 05/16/20 0550 05/17/20 0310  NA 138 139  K 4.1 3.6  CL 106 107  CO2 20* 23  GLUCOSE 91 128*  BUN 9 15  CREATININE 0.73 0.74  CALCIUM 9.0 9.3  MG 2.2 1.8   Liver Function Tests: No results for input(s): AST, ALT, ALKPHOS, BILITOT, PROT, ALBUMIN in the last 168 hours. No results for input(s): LIPASE, AMYLASE in the last 168 hours. No results for input(s): AMMONIA in the last 168 hours. CBC: No results for input(s): WBC, NEUTROABS, HGB,  HCT, MCV, PLT in the last 168 hours. Cardiac Enzymes: No results for input(s): CKTOTAL, CKMB, CKMBINDEX, TROPONINI in the last 168 hours. BNP: Invalid input(s): POCBNP CBG: No results for input(s): GLUCAP in the last 168 hours. D-Dimer No results for input(s): DDIMER in the last 72 hours. Hgb A1c No results for input(s): HGBA1C in the last 72 hours. Lipid Profile No results for input(s): CHOL, HDL, LDLCALC, TRIG, CHOLHDL, LDLDIRECT in the last 72 hours. Thyroid function studies No results for input(s): TSH, T4TOTAL, T3FREE, THYROIDAB in the last 72 hours.  Invalid input(s): FREET3 Anemia work up No results for input(s): VITAMINB12, FOLATE, FERRITIN, TIBC, IRON, RETICCTPCT in the last 72 hours. Urinalysis    Component Value Date/Time   COLORURINE YELLOW 05/22/2020 0400   APPEARANCEUR CLEAR 05/22/2020 0400   LABSPEC 1.010 05/22/2020 0400   PHURINE 5.0 05/22/2020 0400   GLUCOSEU NEGATIVE 05/22/2020 0400   HGBUR NEGATIVE 05/22/2020 0400   HGBUR  negative 04/24/2009 1635   BILIRUBINUR NEGATIVE 05/22/2020 0400   BILIRUBINUR negative 05/29/2011 1439   KETONESUR NEGATIVE 05/22/2020 0400   PROTEINUR NEGATIVE 05/22/2020 0400   UROBILINOGEN 0.2 01/26/2014 1912   NITRITE NEGATIVE 05/22/2020 0400   LEUKOCYTESUR NEGATIVE 05/22/2020 0400   Sepsis Labs Invalid input(s): PROCALCITONIN,  WBC,  LACTICIDVEN Microbiology Recent Results (from the past 240 hour(s))  Culture, blood (routine x 2)     Status: None   Collection Time: 05/12/20  9:04 PM   Specimen: BLOOD  Result Value Ref Range Status   Specimen Description BLOOD SITE NOT SPECIFIED  Final   Special Requests   Final    BOTTLES DRAWN AEROBIC AND ANAEROBIC Blood Culture results may not be optimal due to an inadequate volume of blood received in culture bottles   Culture   Final    NO GROWTH 5 DAYS Performed at [facility], [place] [address], [place], [place] [address]    Report Status 05/17/2020 FINAL  Final  SARS Coronavirus 2 by RT PCR (hospital order, performed in [facility] hospital lab) Nasopharyngeal Nasopharyngeal Swab     Status: None   Collection Time: 05/12/20 10:18 PM   Specimen: Nasopharyngeal Swab  Result Value Ref Range Status   SARS Coronavirus 2 NEGATIVE NEGATIVE Final    Comment: (NOTE) SARS-CoV-2 target nucleic acids are NOT DETECTED. The SARS-CoV-2 RNA is generally detectable in upper and lower respiratory specimens during the acute phase of infection. The lowest concentration of SARS-CoV-2 viral copies this assay can detect is 250 copies / mL. A negative result does not preclude SARS-CoV-2 infection and should not be used as the sole basis for treatment or other patient management decisions.  A negative result may occur with improper specimen collection / handling, submission of specimen other than nasopharyngeal swab, presence of viral mutation(s) within the areas targeted by this assay, and inadequate number of viral copies (<250 copies / mL). A  negative result must be combined with clinical observations, patient history, and epidemiological information. Fact Sheet for Patients:   StrictlyIdeas.no Fact Sheet for Healthcare Providers: BankingDealers.co.za This test is not yet approved or cleared  by the [place] FDA and has been authorized for detection and/or diagnosis of SARS-CoV-2 by FDA under an Emergency Use Authorization (EUA).  This EUA will remain in effect (meaning this test can be used) for the duration of the COVID-19 declaration under Section 564(b)(1) of the Act, 21 U.S.C. section 360bbb-3(b)(1), unless the authorization is terminated or revoked sooner. Performed at [facility], [address] [place], [place] [address]   Culture, blood (routine x 2)     Status: None   Collection Time: 05/12/20 11:04 PM   Specimen: BLOOD  Result Value Ref Range Status   Specimen Description BLOOD RIGHT ANTECUBITAL  Final   Special Requests   Final    BOTTLES DRAWN AEROBIC AND ANAEROBIC Blood Culture adequate volume   Culture   Final    NO GROWTH 5 DAYS Performed at [facility], [place] [address], [place], [place] [address]    Report Status 05/17/2020 FINAL  Final  SARS CORONAVIRUS 2 (TAT 6-24 HRS) Nasopharyngeal Nasopharyngeal Swab     Status: None   Collection Time: 05/19/20  6:21 AM   Specimen: Nasopharyngeal Swab  Result Value Ref Range Status   SARS Coronavirus 2 NEGATIVE NEGATIVE Final    Comment: (NOTE) SARS-CoV-2 target nucleic acids are NOT DETECTED. The SARS-CoV-2 RNA is generally detectable in upper and lower respiratory specimens during the acute phase of infection. Negative results do not preclude SARS-CoV-2 infection, do not rule out co-infections with other pathogens, and should not be used as the sole basis for treatment or other patient management decisions. Negative results must be combined with clinical observations, patient history, and  epidemiological information. The expected result is Negative. Fact Sheet for Patients: SugarRoll.be Fact Sheet for Healthcare Providers: https://www.woods-mathews.com/ This test is not yet approved or cleared by the [place] FDA and  has been authorized for detection and/or diagnosis of SARS-CoV-2 by FDA under an Emergency Use Authorization (EUA). This EUA will remain  in effect (meaning this test can be used) for the duration of the COVID-19 declaration under Section 56 4(b)(1) of the Act, 21 U.S.C. section 360bbb-3(b)(1), unless the authorization is terminated or revoked sooner. Performed at [facility], [place] [address], [place], [place] [address]      Time coordinating discharge: Over 40 minutes  SIGNED:   Charlynne Cousins, MD  [facility] 05/22/2020, 9:47 AM Pager   If 7PM-7AM, please contact night-coverage [email or web address] Password [email or web address]

## 2020-05-22 NOTE — Progress Notes (Signed)
Notified X.Blount that pt gets up and down several times with urgency to [facility] and dribbles very small amts yellow urine. c/o pain on the rt flank area. RN will continue to monitor.

## 2020-07-31 ENCOUNTER — Emergency Department (HOSPITAL_COMMUNITY)
Admission: EM | Admit: 2020-07-31 | Discharge: 2020-08-01 | Disposition: A | Discharge status: Home / Self Care | Payer: Medicare Other | Attending: Emergency Medicine | Admitting: Emergency Medicine

## 2020-07-31 ENCOUNTER — Other Ambulatory Visit: Payer: Self-pay

## 2020-07-31 ENCOUNTER — Encounter (HOSPITAL_COMMUNITY): Payer: Self-pay | Admitting: Emergency Medicine

## 2020-07-31 DIAGNOSIS — Z7982 Long term (current) use of aspirin: Secondary | ICD-10-CM | POA: Diagnosis not present

## 2020-07-31 DIAGNOSIS — Z87891 Personal history of nicotine dependence: Secondary | ICD-10-CM | POA: Diagnosis not present

## 2020-07-31 DIAGNOSIS — Z79899 Other long term (current) drug therapy: Secondary | ICD-10-CM | POA: Insufficient documentation

## 2020-07-31 DIAGNOSIS — I10 Essential (primary) hypertension: Secondary | ICD-10-CM | POA: Insufficient documentation

## 2020-07-31 DIAGNOSIS — D72829 Elevated white blood cell count, unspecified: Secondary | ICD-10-CM

## 2020-07-31 DIAGNOSIS — R1084 Generalized abdominal pain: Secondary | ICD-10-CM

## 2020-07-31 DIAGNOSIS — R197 Diarrhea, unspecified: Secondary | ICD-10-CM | POA: Diagnosis not present

## 2020-07-31 DIAGNOSIS — N390 Urinary tract infection, site not specified: Secondary | ICD-10-CM

## 2020-07-31 DIAGNOSIS — R109 Unspecified abdominal pain: Secondary | ICD-10-CM | POA: Diagnosis not present

## 2020-07-31 MED ORDER — SODIUM CHLORIDE 0.9% FLUSH
3.0000 mL | Freq: Once | INTRAVENOUS | Status: AC
  Administered 2020-08-01: 3 mL via INTRAVENOUS

## 2020-07-31 NOTE — ED Triage Notes (Signed)
Patient reports RLQ abdominal pain with diarrhea onset yesterday , denies emesis or fever .

## 2020-08-01 ENCOUNTER — Emergency Department (HOSPITAL_COMMUNITY): Payer: Medicare Other

## 2020-08-01 LAB — COMPREHENSIVE METABOLIC PANEL
ALT: 11 U/L (ref 0–44)
AST: 29 U/L (ref 15–41)
Albumin: 4.1 g/dL (ref 3.5–5.0)
Alkaline Phosphatase: 127 U/L — ABNORMAL HIGH (ref 38–126)
Anion gap: 15 (ref 5–15)
BUN: 24 mg/dL — ABNORMAL HIGH (ref 8–23)
CO2: 20 mmol/L — ABNORMAL LOW (ref 22–32)
Calcium: 10.1 mg/dL (ref 8.9–10.3)
Chloride: 103 mmol/L (ref 98–111)
Creatinine, Ser: 0.97 mg/dL (ref 0.44–1.00)
GFR calc Af Amer: >60 mL/min (ref >60)
GFR calc non Af Amer: 53 mL/min — ABNORMAL LOW (ref >60)
Glucose, Bld: 149 mg/dL — ABNORMAL HIGH (ref 70–99)
Potassium: 3.8 mmol/L (ref 3.5–5.1)
Sodium: 138 mmol/L (ref 135–145)
Total Bilirubin: 0.9 mg/dL (ref 0.3–1.2)
Total Protein: 7 g/dL (ref 6.5–8.1)

## 2020-08-01 LAB — URINALYSIS, ROUTINE W REFLEX MICROSCOPIC
Bilirubin Urine: NEGATIVE
Glucose, UA: NEGATIVE mg/dL
Hgb urine dipstick: NEGATIVE
Ketones, ur: 5 mg/dL — AB
Nitrite: NEGATIVE
Protein, ur: 30 mg/dL — AB
Specific Gravity, Urine: >1.046 — ABNORMAL HIGH (ref 1.005–1.030)
pH: 5 (ref 5.0–8.0)

## 2020-08-01 LAB — CBC
HCT: 48.7 % — ABNORMAL HIGH (ref 36.0–46.0)
Hemoglobin: 15.7 g/dL — ABNORMAL HIGH (ref 12.0–15.0)
MCH: 28.2 pg (ref 26.0–34.0)
MCHC: 32.2 g/dL (ref 30.0–36.0)
MCV: 87.6 fL (ref 80.0–100.0)
Platelets: 504 10*3/uL — ABNORMAL HIGH (ref 150–400)
RBC: 5.56 MIL/uL — ABNORMAL HIGH (ref 3.87–5.11)
RDW: 13.4 % (ref 11.5–15.5)
WBC: 18.3 10*3/uL — ABNORMAL HIGH (ref 4.0–10.5)
nRBC: 0 % (ref 0.0–0.2)

## 2020-08-01 LAB — LIPASE, BLOOD: Lipase: 47 U/L (ref 11–51)

## 2020-08-01 MED ORDER — FENTANYL CITRATE (PF) 100 MCG/2ML IJ SOLN
25.0000 ug | Freq: Once | INTRAMUSCULAR | Status: AC
  Administered 2020-08-01: 25 ug via INTRAVENOUS
  Filled 2020-08-01: qty 2

## 2020-08-01 MED ORDER — CEPHALEXIN 500 MG PO CAPS
500.0000 mg | ORAL_CAPSULE | Freq: Two times a day (BID) | ORAL | 0 refills | Status: AC
Start: 2020-08-01 — End: 2020-08-08

## 2020-08-01 MED ORDER — SODIUM CHLORIDE 0.9 % IV BOLUS
1000.0000 mL | Freq: Once | INTRAVENOUS | Status: AC
  Administered 2020-08-01: 1000 mL via INTRAVENOUS

## 2020-08-01 MED ORDER — CEPHALEXIN 250 MG PO CAPS
500.0000 mg | ORAL_CAPSULE | Freq: Once | ORAL | Status: AC
  Administered 2020-08-01: 500 mg via ORAL
  Filled 2020-08-01: qty 2

## 2020-08-01 MED ORDER — IOHEXOL 300 MG/ML  SOLN
80.0000 mL | Freq: Once | INTRAMUSCULAR | Status: AC | PRN
  Administered 2020-08-01: 80 mL via INTRAVENOUS

## 2020-08-01 NOTE — ED Notes (Signed)
Patient transported to CT 

## 2020-08-01 NOTE — ED Provider Notes (Signed)
7:27 AM Care assumed from Dr. Darl Householder.  At time of transfer of care, patient is awaiting results of urinalysis as she had abdominal pain with a leukocytosis.  CT scan was overall reassuring.  Plan of care will be to discharge patient either with or without antibiotics depending on urine results.  Urine just returned showing leukocytes whites and bacteria with no epithelial cells.  No nitrites present.  Culture will be added and she will be treated with antibiotics and given her otherwise well appearance, anticipate discharge home.   7:36 AM Patient agrees with oral antibiotics now and a prescription for Keflex as she does have just tolerated in the past.  She agrees to follow-up with PCP and understands return precautions.  She will be discharged.  7:43 AM Patient has a legal guardian who we tried to call but were unsuccessful.  Patient will be discharged.    Clinical Impression: 1. Generalized abdominal pain   2. Lower urinary tract infectious disease   3. Leukocytosis, unspecified type     Disposition: Discharge  Condition: Good  I have discussed the results, Dx and Tx plan with the pt(& family if present). He/she/they expressed understanding and agree(s) with the plan. Discharge instructions discussed at great length. Strict return precautions discussed and pt &/or family have verbalized understanding of the instructions. No further questions at time of discharge.    New Prescriptions   No medications on file    Follow Up: Heywood Bene, PA-C [address] [place] [address] [phone]         Donneisha Beane, Gwenyth Allegra, MD 08/01/20 1525

## 2020-08-01 NOTE — ED Provider Notes (Signed)
[facility] EMERGENCY DEPARTMENT Provider Note   CSN: [id number] Arrival date & time: 07/31/20  2155     History Chief Complaint  Patient presents with  . Abdominal Pain    Angelica Bell is a 86 y.o. female history of hyponatremia, PE off anticoagulation here presenting with abdominal pain and diarrhea.  Patient states that she has lower abdominal pain started today.  She states that it is crampy and constant.  Patient also has some loose stools as well.  Denies any vomiting or fever.  Had appendectomy and hysterectomy in the past.  The history is provided by the patient.       Past Medical History:  Diagnosis Date  . Chronic back pain    with degenerative joint disease  . Chronic pain disorder    with narcotic management  . Dry eyes   . Frequent falls   . HOH (hard of hearing)   . Hypertension   . Multiple rib fractures 02/2020  . Osteoporosis   . PE (pulmonary embolism)    After hysterectomy in 1967  . Pneumonia   . Rhabdomyolysis     Patient Active Problem List   Diagnosis Date Noted  . Acute respiratory failure with hypoxia ([place]) 05/17/2020  . Opioid overdose ([place]) 05/13/2020  . DNR (do not resuscitate) 03/09/2020  . Frequent falls 03/09/2020  . Multiple rib fractures 03/08/2020  . Common bile duct dilatation 03/08/2020  . Leukocytosis 03/08/2020  . Normocytic anemia 03/08/2020  . Hypomagnesemia 02/08/2020  . Fall 02/06/2020  . AKI (acute kidney injury) ([place]) 02/05/2020  . UTI (urinary tract infection) 09/01/2019  . Rhabdomyolysis 09/01/2019  . Shock ([place]) 08/31/2019  . Acute cystitis 08/03/2019  . Confusion and disorientation 08/03/2019  . Cerebrovascular accident (CVA) ([place])   . ARF (acute renal failure) ([place]) 07/26/2019  . Hyponatremia 07/26/2019  . Abnormal liver function 07/26/2019  . Peripheral neuropathy   . Drug overdose   . Polypharmacy   . Altered mental status 10/25/2017  . Closed displaced intertrochanteric  fracture of left femur ([place]) 02/16/2017  . Hip fracture, right ([place]) 12/25/2015  . Chronic pain 12/24/2015  . Fracture of femoral neck, right, closed ([place]) 12/24/2015  . Essential hypertension 12/24/2015  . Community acquired pneumonia 04/15/2013  . Hypokalemia 04/15/2013  . Labial cyst 02/08/2012  . POSTMENOPAUSAL SYNDROME 11/28/2009  . PERIPHERAL NEUROPATHY, LOWER EXTREMITY, RIGHT 02/22/2009  . INSOMNIA-SLEEP DISORDER-UNSPEC 10/12/2008  . Lumbago 08/02/2008  . 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 ANXIETY DEPRESSION 12/16/2007  . Hyperlipidemia 07/08/2007  . Osteoarthritis 07/08/2007  . Osteoporosis 07/08/2007  . SCOLIOSIS NEC 07/08/2007    Past Surgical History:  Procedure Laterality Date  . ABDOMINAL HYSTERECTOMY    . APPENDECTOMY    . BREAST ENHANCEMENT SURGERY    . CATARACT EXTRACTION    . FEMUR IM NAIL Left 02/17/2017   Procedure: INTRAMEDULLARY (IM) NAIL FEMORAL;  Surgeon: Nicholes Stairs, MD;  Location: [place];  Service: Orthopedics;  Laterality: Left;  . HIP ARTHROPLASTY Right 12/24/2015   Procedure: ARTHROPLASTY BIPOLAR HIP (HEMIARTHROPLASTY);  Surgeon: Netta Cedars, MD;  Location: [facility];  Service: Orthopedics;  Laterality: Right;  . TONSILLECTOMY       OB History   No obstetric history on file.     Family History  Problem Relation Age of Onset  . Heart Problems Mother     Social History   Tobacco Use  . Smoking status: Former Smoker    Years: 13.00    Quit  date: 04/15/1976    Years since quitting: 44.3  . Smokeless tobacco: Never Used  Vaping Use  . Vaping Use: Never used  Substance Use Topics  . Alcohol use: No  . Drug use: No    Home Medications Prior to Admission medications   Medication Sig Start Date End Date Taking? Authorizing Provider  acetaminophen (TYLENOL) 500 MG tablet Take 500 mg by mouth every 6 (six) hours as needed.    [provider]  amLODipine (NORVASC) 2.5 MG tablet Take 1 tablet (2.5 mg total) by  mouth daily. 05/18/20   Charlynne Cousins, MD  aspirin EC 81 MG EC tablet Take 1 tablet (81 mg total) by mouth daily. 09/15/19   Matcha, Beverely Pace, MD  feeding supplement, ENSURE ENLIVE, (ENSURE ENLIVE) LIQD Take 237 mLs by mouth 2 (two) times daily between meals. 03/10/20   Black, Lezlie Octave, NP  gabapentin (NEURONTIN) 100 MG capsule Take 1 capsule (100 mg total) by mouth 3 (three) times daily. Patient taking differently: Take 100 mg by mouth as needed (pain).  02/08/20   Harold Hedge, MD  Multiple Vitamin (MULTIVITAMIN WITH MINERALS) TABS tablet Take 1 tablet by mouth daily. 03/11/20   Black, Lezlie Octave, NP    Allergies    Hctz [hydrochlorothiazide], Amoxicillin-pot clavulanate, Lisinopril-hydrochlorothiazide, Sulfa antibiotics, and Sulfonamide derivatives  Review of Systems   Review of Systems  Gastrointestinal: Positive for abdominal pain.  All other systems reviewed and are negative.   Physical Exam Updated Vital Signs BP 130/80   Pulse (!) 105   Temp (!) 97.4 F (36.3 C) (Oral)   Resp 19   Ht 5\' 6"  (1.676 m)   Wt 48 kg   SpO2 98%   BMI 17.08 kg/m   Physical Exam Vitals and nursing note reviewed.  Constitutional:      Comments: Slightly uncomfortable   HENT:     Head: Normocephalic.     Mouth/Throat:     Pharynx: Oropharynx is clear.  Eyes:     Extraocular Movements: Extraocular movements intact.  Cardiovascular:     Rate and Rhythm: Normal rate and regular rhythm.     Heart sounds: Normal heart sounds.  Pulmonary:     Effort: Pulmonary effort is normal.     Breath sounds: Normal breath sounds.  Abdominal:     General: Abdomen is flat.     Comments: Mild periumbilical tenderness and diffuse lower abdominal tenderness   Skin:    General: Skin is warm.     Capillary Refill: Capillary refill takes less than 2 seconds.  Neurological:     General: No focal deficit present.     Mental Status: She is oriented to person, place, and time.  Psychiatric:        Mood and  Affect: Mood normal.        Behavior: Behavior normal.     ED Results / Procedures / Treatments   Labs (all labs ordered are listed, but only abnormal results are displayed) Labs Reviewed  COMPREHENSIVE METABOLIC PANEL - Abnormal; Notable for the following components:      Result Value   CO2 20 (*)    Glucose, Bld 149 (*)    BUN 24 (*)    Alkaline Phosphatase 127 (*)    GFR calc non Af Amer 53 (*)    All other components within normal limits  CBC - Abnormal; Notable for the following components:   WBC 18.3 (*)    RBC 5.56 (*)  Hemoglobin 15.7 (*)    HCT 48.7 (*)    Platelets 504 (*)    All other components within normal limits  LIPASE, BLOOD  URINALYSIS, ROUTINE W REFLEX MICROSCOPIC    EKG None  Radiology No results found.  Procedures Procedures (including critical care time)  Medications Ordered in ED Medications  sodium chloride flush (NS) 0.9 % injection 3 mL (has no administration in time range)  sodium chloride 0.9 % bolus 1,000 mL (has no administration in time range)    ED Course  I have reviewed the triage vital signs and the nursing notes.  Pertinent labs & imaging results that were available during my care of the patient were reviewed by me and considered in my medical decision making (see chart for details).    MDM Rules/Calculators/A&P                         Nayeli Michala Deblanc is a 86 y.o. female here with lower abdominal pain. Consider colitis vs diverticulitis vs UTI vs gastroenteritis. Will get cbc, cmp, UA, CT ab/pel. Will hydrate and reassess.   7:19 AM WBC is 18. CT ab/pel unremarkable. UA pending. Signed out to Dr. Sherry Ruffing in the ED to follow up UA and anticipate discharge home.   Final Clinical Impression(s) / ED Diagnoses Final diagnoses:  None    Rx / DC Orders ED Discharge Orders    None       Drenda Freeze, MD 08/01/20 [phone]

## 2020-08-01 NOTE — Discharge Instructions (Addendum)
Your work-up today showed a urinary tract infection as a likely cause of her abdominal discomfort.  The CT scan did not show acute diverticulitis, obstruction, abscess, or new acute abnormality.  Plan of care with previous team was to give antibiotics and have you follow-up with your primary doctor.  Please rest and stay hydrated.  Please take the antibiotics for the next week.  If any symptoms change or worsen, please return to the nearest emergency department.

## 2020-08-01 NOTE — ED Notes (Addendum)
Took pt to bathroom was not able to urine yet.

## 2020-08-02 LAB — URINE CULTURE: Culture: >=100000 — AB

## 2020-08-03 ENCOUNTER — Telehealth: Payer: Self-pay

## 2020-08-03 NOTE — Telephone Encounter (Signed)
Post ED Visit - Positive Culture Follow-up  Culture report reviewed by antimicrobial stewardship pharmacist: [place] Team []  Elenor Quinones, Pharm.D. []  Heide Guile, Pharm.D., BCPS AQ-ID []  Parks Neptune, Pharm.D., BCPS []  Alycia Rossetti, Pharm.D., BCPS []  Christoval, Pharm.D., BCPS, AAHIVP []  Legrand Como, Pharm.D., BCPS, AAHIVP []  Salome Arnt, PharmD, BCPS []  Johnnette Gourd, PharmD, BCPS []  Hughes Better, PharmD, BCPS []  Leeroy Cha, PharmD []  Laqueta Linden, PharmD, BCPS []  Albertina Parr, PharmD [place] Team []  Leodis Sias, PharmD []  Lindell Spar, PharmD []  Royetta Asal, PharmD []  Graylin Shiver, Rph []  Rema Fendt) Glennon Mac, PharmD []  Arlyn Dunning, PharmD []  Netta Cedars, PharmD []  Dia Sitter, PharmD []  Leone Haven, PharmD []  Gretta Arab, PharmD []  Theodis Shove, PharmD []  Peggyann Juba, PharmD []  Reuel Boom, PharmD   Positive urine culture Treated with Cephalexin, organism sensitive to the same and no further patient follow-up is required at this time.  Genia Del 08/03/2020, 8:31 AM

## 2020-08-29 ENCOUNTER — Inpatient Hospital Stay (HOSPITAL_COMMUNITY)
Admission: EM | Admit: 2020-08-29 | Discharge: 2020-09-05 | DRG: 064 | Disposition: A | Discharge status: Skilled Nursing Facility | Payer: Medicare Other | Attending: Internal Medicine | Admitting: Internal Medicine

## 2020-08-29 ENCOUNTER — Emergency Department (HOSPITAL_COMMUNITY): Payer: Medicare Other

## 2020-08-29 DIAGNOSIS — E785 Hyperlipidemia, unspecified: Secondary | ICD-10-CM | POA: Diagnosis present

## 2020-08-29 DIAGNOSIS — Z515 Encounter for palliative care: Secondary | ICD-10-CM

## 2020-08-29 DIAGNOSIS — I639 Cerebral infarction, unspecified: Secondary | ICD-10-CM | POA: Diagnosis present

## 2020-08-29 DIAGNOSIS — I272 Pulmonary hypertension, unspecified: Secondary | ICD-10-CM | POA: Diagnosis present

## 2020-08-29 DIAGNOSIS — Z96641 Presence of right artificial hip joint: Secondary | ICD-10-CM | POA: Diagnosis present

## 2020-08-29 DIAGNOSIS — I63512 Cerebral infarction due to unspecified occlusion or stenosis of left middle cerebral artery: Secondary | ICD-10-CM | POA: Diagnosis not present

## 2020-08-29 DIAGNOSIS — F0390 Unspecified dementia without behavioral disturbance: Secondary | ICD-10-CM | POA: Diagnosis present

## 2020-08-29 DIAGNOSIS — E43 Unspecified severe protein-calorie malnutrition: Secondary | ICD-10-CM | POA: Diagnosis present

## 2020-08-29 DIAGNOSIS — M810 Age-related osteoporosis without current pathological fracture: Secondary | ICD-10-CM | POA: Diagnosis present

## 2020-08-29 DIAGNOSIS — Z8701 Personal history of pneumonia (recurrent): Secondary | ICD-10-CM

## 2020-08-29 DIAGNOSIS — Z86711 Personal history of pulmonary embolism: Secondary | ICD-10-CM

## 2020-08-29 DIAGNOSIS — R29722 NIHSS score 22: Secondary | ICD-10-CM | POA: Diagnosis not present

## 2020-08-29 DIAGNOSIS — Z7189 Other specified counseling: Secondary | ICD-10-CM

## 2020-08-29 DIAGNOSIS — R29718 NIHSS score 18: Secondary | ICD-10-CM | POA: Diagnosis not present

## 2020-08-29 DIAGNOSIS — R4701 Aphasia: Secondary | ICD-10-CM | POA: Diagnosis present

## 2020-08-29 DIAGNOSIS — R4182 Altered mental status, unspecified: Secondary | ICD-10-CM | POA: Diagnosis present

## 2020-08-29 DIAGNOSIS — G5790 Unspecified mononeuropathy of unspecified lower limb: Secondary | ICD-10-CM | POA: Diagnosis present

## 2020-08-29 DIAGNOSIS — R471 Dysarthria and anarthria: Secondary | ICD-10-CM | POA: Diagnosis present

## 2020-08-29 DIAGNOSIS — M25559 Pain in unspecified hip: Secondary | ICD-10-CM

## 2020-08-29 DIAGNOSIS — G8929 Other chronic pain: Secondary | ICD-10-CM | POA: Diagnosis present

## 2020-08-29 DIAGNOSIS — Z79891 Long term (current) use of opiate analgesic: Secondary | ICD-10-CM

## 2020-08-29 DIAGNOSIS — I10 Essential (primary) hypertension: Secondary | ICD-10-CM | POA: Diagnosis present

## 2020-08-29 DIAGNOSIS — Z7982 Long term (current) use of aspirin: Secondary | ICD-10-CM

## 2020-08-29 DIAGNOSIS — R29716 NIHSS score 16: Secondary | ICD-10-CM | POA: Diagnosis not present

## 2020-08-29 DIAGNOSIS — Z880 Allergy status to penicillin: Secondary | ICD-10-CM

## 2020-08-29 DIAGNOSIS — Z888 Allergy status to other drugs, medicaments and biological substances status: Secondary | ICD-10-CM

## 2020-08-29 DIAGNOSIS — Z79899 Other long term (current) drug therapy: Secondary | ICD-10-CM

## 2020-08-29 DIAGNOSIS — R29715 NIHSS score 15: Secondary | ICD-10-CM | POA: Diagnosis not present

## 2020-08-29 DIAGNOSIS — H9190 Unspecified hearing loss, unspecified ear: Secondary | ICD-10-CM | POA: Diagnosis present

## 2020-08-29 DIAGNOSIS — Z90710 Acquired absence of both cervix and uterus: Secondary | ICD-10-CM

## 2020-08-29 DIAGNOSIS — G9341 Metabolic encephalopathy: Secondary | ICD-10-CM | POA: Diagnosis present

## 2020-08-29 DIAGNOSIS — G8191 Hemiplegia, unspecified affecting right dominant side: Secondary | ICD-10-CM | POA: Diagnosis present

## 2020-08-29 DIAGNOSIS — G894 Chronic pain syndrome: Secondary | ICD-10-CM | POA: Diagnosis present

## 2020-08-29 DIAGNOSIS — Z87891 Personal history of nicotine dependence: Secondary | ICD-10-CM

## 2020-08-29 DIAGNOSIS — Z882 Allergy status to sulfonamides status: Secondary | ICD-10-CM

## 2020-08-29 DIAGNOSIS — E559 Vitamin D deficiency, unspecified: Secondary | ICD-10-CM | POA: Diagnosis present

## 2020-08-29 DIAGNOSIS — K5900 Constipation, unspecified: Secondary | ICD-10-CM | POA: Diagnosis present

## 2020-08-29 DIAGNOSIS — M6281 Muscle weakness (generalized): Secondary | ICD-10-CM | POA: Diagnosis present

## 2020-08-29 DIAGNOSIS — M549 Dorsalgia, unspecified: Secondary | ICD-10-CM | POA: Diagnosis present

## 2020-08-29 DIAGNOSIS — R29714 NIHSS score 14: Secondary | ICD-10-CM | POA: Diagnosis not present

## 2020-08-29 DIAGNOSIS — Z993 Dependence on wheelchair: Secondary | ICD-10-CM

## 2020-08-29 DIAGNOSIS — Z681 Body mass index (BMI) 19 or less, adult: Secondary | ICD-10-CM

## 2020-08-29 DIAGNOSIS — Z20822 Contact with and (suspected) exposure to covid-19: Secondary | ICD-10-CM | POA: Diagnosis present

## 2020-08-29 NOTE — ED Notes (Signed)
[phone] [name] daughter

## 2020-08-29 NOTE — ED Triage Notes (Signed)
Pt arrived via [facility] from home after pts son called ems after son states pt was calling son for help. EMS states there were 15 pills missing from Oxy bottle and an empty benedryl box at home. Unknown if pt took them. Pt has been altered for EMS and knows only self. Same for arrival to ED.

## 2020-08-29 NOTE — ED Provider Notes (Signed)
[facility] EMERGENCY DEPARTMENT Provider Note   CSN: [id number] Arrival date & time: 08/29/20  2225     History Chief Complaint  Patient presents with  . Altered Mental Status    Angelica Bell is a 86 y.o. female.  The history is provided by the patient and medical records.    LEVEL V CAVEAT:  AMS  86 y.o. F with hx of chronic back pain, frequent falls, remote history of PE, recurrent rhabdomyolysis, hx of opioid overdose, presenting to the ED with altered mental status.  Unknown LKW.  EMS called by son after patient called him asking for help.  Upon EMS arrival, patient disoriented, only able to answer to her name.  There were 15 tabs missing from oxycodone bottle and empty box of benadryl nearby-- unsure if patient took these this evening or previously.  Patient unable to provide any meaningful history here in the ED, only opens eyes and responds to name.  Patient has had prior admissions for OD in the past.  Oxycodone 30mg  last refilled 08-05-20 for #90 tabs.  I have contacted patient's son x3 without response.  Her POA (grandson) also not answering.  Have spoken with patient's daughter-- not able to give me any information about events of tonight.  Past Medical History:  Diagnosis Date  . Chronic back pain    with degenerative joint disease  . Chronic pain disorder    with narcotic management  . Dry eyes   . Frequent falls   . HOH (hard of hearing)   . Hypertension   . Multiple rib fractures 02/2020  . Osteoporosis   . PE (pulmonary embolism)    After hysterectomy in 1967  . Pneumonia   . Rhabdomyolysis     Patient Active Problem List   Diagnosis Date Noted  . Acute respiratory failure with hypoxia ([place]) 05/17/2020  . Opioid overdose ([place]) 05/13/2020  . DNR (do not resuscitate) 03/09/2020  . Frequent falls 03/09/2020  . Multiple rib fractures 03/08/2020  . Common bile duct dilatation 03/08/2020  . Leukocytosis 03/08/2020  . Normocytic  anemia 03/08/2020  . Hypomagnesemia 02/08/2020  . Fall 02/06/2020  . AKI (acute kidney injury) ([place]) 02/05/2020  . UTI (urinary tract infection) 09/01/2019  . Rhabdomyolysis 09/01/2019  . Shock ([place]) 08/31/2019  . Acute cystitis 08/03/2019  . Confusion and disorientation 08/03/2019  . Cerebrovascular accident (CVA) ([place])   . ARF (acute renal failure) ([place]) 07/26/2019  . Hyponatremia 07/26/2019  . Abnormal liver function 07/26/2019  . Peripheral neuropathy   . Drug overdose   . Polypharmacy   . Altered mental status 10/25/2017  . Closed displaced intertrochanteric fracture of left femur ([place]) 02/16/2017  . Hip fracture, right ([place]) 12/25/2015  . Chronic pain 12/24/2015  . Fracture of femoral neck, right, closed ([place]) 12/24/2015  . Essential hypertension 12/24/2015  . Community acquired pneumonia 04/15/2013  . Hypokalemia 04/15/2013  . Labial cyst 02/08/2012  . POSTMENOPAUSAL SYNDROME 11/28/2009  . PERIPHERAL NEUROPATHY, LOWER EXTREMITY, RIGHT 02/22/2009  . INSOMNIA-SLEEP DISORDER-UNSPEC 10/12/2008  . Lumbago 08/02/2008  . 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 ANXIETY DEPRESSION 12/16/2007  . Hyperlipidemia 07/08/2007  . Osteoarthritis 07/08/2007  . Osteoporosis 07/08/2007  . SCOLIOSIS NEC 07/08/2007    Past Surgical History:  Procedure Laterality Date  . ABDOMINAL HYSTERECTOMY    . APPENDECTOMY    . BREAST ENHANCEMENT SURGERY    . CATARACT EXTRACTION    . FEMUR IM NAIL Left 02/17/2017   Procedure: INTRAMEDULLARY (IM) NAIL  FEMORAL;  Surgeon: Nicholes Stairs, MD;  Location: [place];  Service: Orthopedics;  Laterality: Left;  . HIP ARTHROPLASTY Right 12/24/2015   Procedure: ARTHROPLASTY BIPOLAR HIP (HEMIARTHROPLASTY);  Surgeon: Netta Cedars, MD;  Location: [facility];  Service: Orthopedics;  Laterality: Right;  . TONSILLECTOMY       OB History   No obstetric history on file.     Family History  Problem Relation Age of Onset  . Heart Problems Mother      Social History   Tobacco Use  . Smoking status: Former Smoker    Years: 13.00    Quit date: 04/15/1976    Years since quitting: 44.4  . Smokeless tobacco: Never Used  Vaping Use  . Vaping Use: Never used  Substance Use Topics  . Alcohol use: No  . Drug use: No    Home Medications Prior to Admission medications   Medication Sig Start Date End Date Taking? Authorizing Provider  acetaminophen (TYLENOL) 500 MG tablet Take 500 mg by mouth every 6 (six) hours as needed.    [provider]  amLODipine (NORVASC) 2.5 MG tablet Take 1 tablet (2.5 mg total) by mouth daily. 05/18/20   Charlynne Cousins, MD  aspirin EC 81 MG EC tablet Take 1 tablet (81 mg total) by mouth daily. 09/15/19   Matcha, Beverely Pace, MD  feeding supplement, ENSURE ENLIVE, (ENSURE ENLIVE) LIQD Take 237 mLs by mouth 2 (two) times daily between meals. 03/10/20   Black, Lezlie Octave, NP  gabapentin (NEURONTIN) 100 MG capsule Take 1 capsule (100 mg total) by mouth 3 (three) times daily. Patient taking differently: Take 100 mg by mouth as needed (pain).  02/08/20   Harold Hedge, MD  Multiple Vitamin (MULTIVITAMIN WITH MINERALS) TABS tablet Take 1 tablet by mouth daily. 03/11/20   Black, Lezlie Octave, NP    Allergies    Hctz [hydrochlorothiazide], Amoxicillin-pot clavulanate, Lisinopril-hydrochlorothiazide, Sulfa antibiotics, and Sulfonamide derivatives  Review of Systems   Review of Systems  Unable to perform ROS: Mental status change    Physical Exam Updated Vital Signs BP (!) 156/88 (BP Location: Right Arm)   Pulse 84   Temp 97.8 F (36.6 C) (Oral)   Resp 16   SpO2 98%   Physical Exam Vitals and nursing note reviewed.  Constitutional:      Appearance: She is well-developed.     Comments: Elderly, thin, NAD  HENT:     Head: Normocephalic and atraumatic.  Eyes:     Conjunctiva/sclera: Conjunctivae normal.     Pupils: Pupils are equal, round, and reactive to light.  Cardiovascular:     Rate and Rhythm:  Normal rate and regular rhythm.     Heart sounds: Normal heart sounds.  Pulmonary:     Effort: Pulmonary effort is normal. No respiratory distress.     Breath sounds: Normal breath sounds. No wheezing or rhonchi.  Abdominal:     General: Bowel sounds are normal.     Palpations: Abdomen is soft.     Tenderness: There is no abdominal tenderness. There is no rebound.  Musculoskeletal:        General: Normal range of motion.     Cervical back: Normal range of motion.  Skin:    General: Skin is warm and dry.  Neurological:     Comments: Somnolent, opens eyes to voice, moans but does not follow commands or answer questions     ED Results / Procedures / Treatments   Labs (all labs ordered  are listed, but only abnormal results are displayed) Labs Reviewed  CBC WITH DIFFERENTIAL/PLATELET - Abnormal; Notable for the following components:      Result Value   RBC 5.24 (*)    Platelets 473 (*)    All other components within normal limits  COMPREHENSIVE METABOLIC PANEL - Abnormal; Notable for the following components:   Glucose, Bld 120 (*)    All other components within normal limits  ACETAMINOPHEN LEVEL - Abnormal; Notable for the following components:   Acetaminophen (Tylenol), Serum <10 (*)    All other components within normal limits  SALICYLATE LEVEL - Abnormal; Notable for the following components:   Salicylate Lvl <7.6 (*)    All other components within normal limits  CK - Abnormal; Notable for the following components:   Total CK 29 (*)    All other components within normal limits  SARS CORONAVIRUS 2 BY RT PCR (HOSPITAL ORDER, [facility])  RAPID URINE DRUG SCREEN, HOSP PERFORMED  ETHANOL  AMMONIA  URINALYSIS, ROUTINE W REFLEX MICROSCOPIC  LACTIC ACID, PLASMA    EKG EKG Interpretation  Date/Time:  Tuesday August 29 2020 22:45:36 EDT Ventricular Rate:  84 PR Interval:    QRS Duration: 89 QT Interval:  364 QTC Calculation: 431 R  Axis:   44 Text Interpretation: Sinus rhythm Probable left atrial enlargement No significant change since prior 5/21 Confirmed by Aletta Edouard [id number]) on 08/29/2020 10:49:58 PM   Radiology DG Chest 1 View  Result Date: 08/29/2020 CLINICAL DATA:  Altered mental status EXAM: CHEST  1 VIEW COMPARISON:  05/12/2020 FINDINGS: Lungs are clear. No pneumothorax or pleural effusion. Cardiac size within normal limits. The pulmonary vascularity is normal. Moderate to severe thoracolumbar sigmoid scoliosis is again noted. IMPRESSION: No active disease. Electronically Signed   By: Fidela Salisbury MD   On: 08/29/2020 23:00   CT Head Wo Contrast  Result Date: 08/29/2020 CLINICAL DATA:  Delirium EXAM: CT HEAD WITHOUT CONTRAST TECHNIQUE: Contiguous axial images were obtained from the base of the skull through the vertex without intravenous contrast. COMPARISON:  05/12/2020 FINDINGS: Brain: Remote high right frontal subcortical white matter and right cerebellar infarcts are again noted. Remote lacunar infarct within the right basal ganglia again noted. Mild to moderate parenchymal volume loss is commensurate with the patient's age. Moderate periventricular white matter changes are present likely reflecting the sequela of small vessel ischemia. No evidence of acute intracranial hemorrhage or infarct. No abnormal mass effect or midline shift. No abnormal intra or extra-axial mass lesion or fluid collection. Ventricular size is normal. Cerebellum is otherwise unremarkable. Vascular: No asymmetric hyperdense vasculature at the skull base. Skull: The calvarium is intact. Sinuses/Orbits: Minimal mucosal thickening in dependently layering mucus or fluid within the left sphenoid sinus. Remaining paranasal sinuses are clear. The orbits are unremarkable. Other: Mastoid air cells and middle ear cavities are clear IMPRESSION: Multiple remote infarcts. No evidence of acute intracranial hemorrhage or infarct. Electronically Signed   By:  Fidela Salisbury MD   On: 08/29/2020 23:27    Procedures Procedures (including critical care time)  Medications Ordered in ED Medications - No data to display  ED Course  I have reviewed the triage vital signs and the nursing notes.  Pertinent labs & imaging results that were available during my care of the patient were reviewed by me and considered in my medical decision making (see chart for details).    MDM Rules/Calculators/A&P  86 year old female here with AMS.  Reportedly called son  earlier today asking for help, he called EMS and sent them to her house.  No family present on arrival to home, did find pills missing from oxycodone bottle and empty box of benadryl.  Patient is chronically prescribed oxycodone, last had this filled 08/05/2020 #90 tabs.  On arrival she is somnolent but does open eyes and respond when her name is called.  She does not follow commands or answer questions.  She is hemodynamically stable.  She does not have any outward signs of trauma.  Differential is broad.  Will obtain CT of the head, chest x-ray, screening labs, tox screen, UDS, UA.  Labs as above.  CT of the head without any acute findings, does have remote infarcts noted.  Chest x-ray is clear.  Covid screen is negative.  Have continued to attempt to contact family.  I was able to speak with her daughter, however she was not able to give me any further details of the events of tonight.  Her grandson Angelica Bell) is POA who have contacted x2 without answer.  I have also called her son Angelica Bell) x3 without answer.  2:56 AM Patient still altered but continues to arouse to voice.  Unable to answer questions or follow commands.  Still unable to get any additional history from family despite multiple phone calls.  Work-up is largely unrevealing--no signs of acute UTI or sepsis.  CT head with remote infarcts but no acute findings.  CXR clear.  covid negative.  Patient is chronically prescribed opiates and there was report of  several tablets missing from her bottle, however her UDS today is actually negative for opiates.  Unknown source of her altered mental status.  Will admit for ongoing management.  Spoke with hospitalist, Dr. Marcello Moores-- will admit for ongoing care.  Final Clinical Impression(s) / ED Diagnoses Final diagnoses:  Altered mental status, unspecified altered mental status type    Rx / DC Orders ED Discharge Orders    None       Larene Pickett, PA-C 08/30/20 0454    Orpah Greek, MD 08/30/20 [phone]

## 2020-08-30 DIAGNOSIS — I10 Essential (primary) hypertension: Secondary | ICD-10-CM | POA: Diagnosis not present

## 2020-08-30 DIAGNOSIS — G9341 Metabolic encephalopathy: Secondary | ICD-10-CM | POA: Diagnosis present

## 2020-08-30 LAB — URINALYSIS, ROUTINE W REFLEX MICROSCOPIC
Bilirubin Urine: NEGATIVE
Glucose, UA: NEGATIVE mg/dL
Hgb urine dipstick: NEGATIVE
Ketones, ur: NEGATIVE mg/dL
Leukocytes,Ua: NEGATIVE
Nitrite: NEGATIVE
Protein, ur: NEGATIVE mg/dL
Specific Gravity, Urine: 1.02 (ref 1.005–1.030)
pH: 6 (ref 5.0–8.0)

## 2020-08-30 LAB — CBC WITH DIFFERENTIAL/PLATELET
Abs Immature Granulocytes: 0.02 10*3/uL (ref 0.00–0.07)
Basophils Absolute: 0.1 10*3/uL (ref 0.0–0.1)
Basophils Relative: 1 %
Eosinophils Absolute: 0.2 10*3/uL (ref 0.0–0.5)
Eosinophils Relative: 3 %
HCT: 46 % (ref 36.0–46.0)
Hemoglobin: 14.9 g/dL (ref 12.0–15.0)
Immature Granulocytes: 0 %
Lymphocytes Relative: 31 %
Lymphs Abs: 2.9 10*3/uL (ref 0.7–4.0)
MCH: 28.4 pg (ref 26.0–34.0)
MCHC: 32.4 g/dL (ref 30.0–36.0)
MCV: 87.8 fL (ref 80.0–100.0)
Monocytes Absolute: 0.6 10*3/uL (ref 0.1–1.0)
Monocytes Relative: 6 %
Neutro Abs: 5.6 10*3/uL (ref 1.7–7.7)
Neutrophils Relative %: 59 %
Platelets: 473 10*3/uL — ABNORMAL HIGH (ref 150–400)
RBC: 5.24 MIL/uL — ABNORMAL HIGH (ref 3.87–5.11)
RDW: 13.5 % (ref 11.5–15.5)
WBC: 9.5 10*3/uL (ref 4.0–10.5)
nRBC: 0 % (ref 0.0–0.2)

## 2020-08-30 LAB — SARS CORONAVIRUS 2 BY RT PCR (HOSPITAL ORDER, PERFORMED IN ~~LOC~~ HOSPITAL LAB): SARS Coronavirus 2: NEGATIVE

## 2020-08-30 LAB — AMMONIA: Ammonia: 22 umol/L (ref 9–35)

## 2020-08-30 LAB — COMPREHENSIVE METABOLIC PANEL
ALT: 15 U/L (ref 0–44)
AST: 25 U/L (ref 15–41)
Albumin: 3.8 g/dL (ref 3.5–5.0)
Alkaline Phosphatase: 103 U/L (ref 38–126)
Anion gap: 12 (ref 5–15)
BUN: 22 mg/dL (ref 8–23)
CO2: 23 mmol/L (ref 22–32)
Calcium: 10.1 mg/dL (ref 8.9–10.3)
Chloride: 102 mmol/L (ref 98–111)
Creatinine, Ser: 0.81 mg/dL (ref 0.44–1.00)
GFR calc Af Amer: >60 mL/min (ref >60)
GFR calc non Af Amer: >60 mL/min (ref >60)
Glucose, Bld: 120 mg/dL — ABNORMAL HIGH (ref 70–99)
Potassium: 4 mmol/L (ref 3.5–5.1)
Sodium: 137 mmol/L (ref 135–145)
Total Bilirubin: 0.9 mg/dL (ref 0.3–1.2)
Total Protein: 6.7 g/dL (ref 6.5–8.1)

## 2020-08-30 LAB — RAPID URINE DRUG SCREEN, HOSP PERFORMED
Amphetamines: NOT DETECTED
Barbiturates: NOT DETECTED
Benzodiazepines: NOT DETECTED
Cocaine: NOT DETECTED
Opiates: NOT DETECTED
Tetrahydrocannabinol: NOT DETECTED

## 2020-08-30 LAB — CK: Total CK: 29 U/L — ABNORMAL LOW (ref 38–234)

## 2020-08-30 LAB — CBG MONITORING, ED: Glucose-Capillary: 117 mg/dL — ABNORMAL HIGH (ref 70–99)

## 2020-08-30 LAB — LACTIC ACID, PLASMA: Lactic Acid, Venous: 1.6 mmol/L (ref 0.5–1.9)

## 2020-08-30 LAB — ACETAMINOPHEN LEVEL: Acetaminophen (Tylenol), Serum: <10 ug/mL — ABNORMAL LOW (ref 10–30)

## 2020-08-30 LAB — SALICYLATE LEVEL: Salicylate Lvl: <7 mg/dL — ABNORMAL LOW (ref 7.0–30.0)

## 2020-08-30 LAB — ETHANOL: Alcohol, Ethyl (B): <10 mg/dL (ref <10)

## 2020-08-30 MED ORDER — HALOPERIDOL LACTATE 5 MG/ML IJ SOLN: 2.0000 mg | Freq: Four times a day (QID) | INTRAMUSCULAR | Status: DC | PRN

## 2020-08-30 MED ORDER — DONEPEZIL HCL 5 MG PO TABS
5.0000 mg | ORAL_TABLET | Freq: Every day | ORAL | Status: DC
  Administered 2020-08-30 – 2020-09-05 (×7): 5 mg via ORAL
  Filled 2020-08-30 (×8): qty 1

## 2020-08-30 MED ORDER — AMLODIPINE BESYLATE 2.5 MG PO TABS
2.5000 mg | ORAL_TABLET | Freq: Every day | ORAL | Status: DC
  Administered 2020-08-30 – 2020-09-05 (×7): 2.5 mg via ORAL
  Filled 2020-08-30 (×7): qty 1

## 2020-08-30 MED ORDER — LACTATED RINGERS IV SOLN: INTRAVENOUS | Status: DC

## 2020-08-30 MED ORDER — ACETAMINOPHEN 650 MG RE SUPP
650.0000 mg | Freq: Four times a day (QID) | RECTAL | Status: DC | PRN
  Filled 2020-08-30: qty 1

## 2020-08-30 MED ORDER — ASPIRIN EC 81 MG PO TBEC
81.0000 mg | DELAYED_RELEASE_TABLET | Freq: Every day | ORAL | Status: DC
  Administered 2020-08-30 – 2020-09-05 (×7): 81 mg via ORAL
  Filled 2020-08-30 (×7): qty 1

## 2020-08-30 MED ORDER — ESCITALOPRAM OXALATE 10 MG PO TABS
20.0000 mg | ORAL_TABLET | Freq: Every day | ORAL | Status: DC
  Administered 2020-08-31 – 2020-09-05 (×6): 20 mg via ORAL
  Filled 2020-08-30 (×6): qty 2

## 2020-08-30 MED ORDER — ACETAMINOPHEN 325 MG PO TABS
650.0000 mg | ORAL_TABLET | Freq: Four times a day (QID) | ORAL | Status: DC | PRN
  Administered 2020-09-01 – 2020-09-04 (×4): 650 mg via ORAL
  Filled 2020-08-30 (×5): qty 2

## 2020-08-30 MED ORDER — SODIUM CHLORIDE 0.9% FLUSH
3.0000 mL | Freq: Two times a day (BID) | INTRAVENOUS | Status: DC
  Administered 2020-08-30 – 2020-09-05 (×7): 3 mL via INTRAVENOUS

## 2020-08-30 MED ORDER — ONDANSETRON HCL 4 MG/2ML IJ SOLN
4.0000 mg | Freq: Four times a day (QID) | INTRAMUSCULAR | Status: DC | PRN
  Administered 2020-08-31 – 2020-09-03 (×2): 4 mg via INTRAVENOUS
  Filled 2020-08-30 (×2): qty 2

## 2020-08-30 MED ORDER — ONDANSETRON HCL 4 MG PO TABS: 4.0000 mg | ORAL_TABLET | Freq: Four times a day (QID) | ORAL | Status: DC | PRN

## 2020-08-30 MED ORDER — MORPHINE SULFATE (PF) 2 MG/ML IV SOLN
2.0000 mg | INTRAVENOUS | Status: DC | PRN
  Administered 2020-09-02 – 2020-09-05 (×5): 2 mg via INTRAVENOUS
  Filled 2020-08-30 (×7): qty 1

## 2020-08-30 MED ORDER — ENOXAPARIN SODIUM 40 MG/0.4ML ~~LOC~~ SOLN
40.0000 mg | SUBCUTANEOUS | Status: DC
  Administered 2020-08-30 – 2020-09-05 (×7): 40 mg via SUBCUTANEOUS
  Filled 2020-08-30 (×7): qty 0.4

## 2020-08-30 NOTE — ED Notes (Signed)
The pts grandson has called in asking about the pt her does not live with her but has seen her  Twice a day  For 2 years  He responds that she is more confused than usual and he has not seen her this way previously

## 2020-08-30 NOTE — ED Notes (Signed)
PT UNABLE TO FEED HERSELF  SHE ATE ABOUT HALF OF HER DINNER

## 2020-08-30 NOTE — ED Notes (Signed)
Report called to the rn on  3w

## 2020-08-30 NOTE — ED Notes (Signed)
At present agitated

## 2020-08-30 NOTE — Plan of Care (Signed)

## 2020-08-30 NOTE — ED Notes (Signed)
The pt is talking non-stop but she can not be understood she does not know the day of the week  The month the year or who the president is.  She must have difficulty hearing because she told me she is not able to hear what I have to say  None of the 3-4 staff members are able to understand what she wants   She told me that her name was Angelica Bell which is true but she does not answer anything else clearly

## 2020-08-30 NOTE — H&P (Signed)
History and Physical    Angelica Bell JGG:[id number] DOB: 1932-05-18 DOA: 08/29/2020  PCP: Heywood Bene, PA-C Consultants:  None Patient coming from:  Home - lives with alone; NOK: Marchia Bond Case, [phone]   Chief Complaint: AMS, possible overdose  HPI: Angelica Bell is a 86 y.o. female with medical history significant of remote PE; chronic pain; and HTN presenting with AMS, possible overdose.  Her grandson reports that yesterday AM he met with [place] (sees her monthly through insurance) - she met with them and things seemed ok.  She was sleeping last evening.  She called him last night about 830 and had nonsensical speech.  He called 911 and she wasn't really responding to questions.  At baseline, she is able to have a conversation.  She is out of oxycodone - lost them, dropped them, or took them all - filled on 8/6 and gone by 8/14, 90 pills.  She has overdosed in the past.  This is the first time she has ever run out so quickly.  She controls her own medications.  There is concern about dementia, but family discord has prevented complete testing.  She was unable to hold up her R arm or feed herself with it yesterday.    ED Course: Carryover, per Dr. Marcello Moores:  AMS, has had similar prior admit.  Apparently called son stating she needed help, but EDP unable to reach son.  On chronic opioids but negative UDS.    Review of Systems: Unable to perform  Ambulatory Status:  Transfers via wheelchair  COVID Vaccine Status:  None - grandson has allowed her to make that decision  Past Medical History:  Diagnosis Date  . Chronic back pain    with degenerative joint disease  . Chronic pain disorder    with narcotic management  . Dry eyes   . Frequent falls   . HOH (hard of hearing)   . Hypertension   . Multiple rib fractures 02/2020  . Osteoporosis   . PE (pulmonary embolism)    After hysterectomy in 1967  . Pneumonia   . Rhabdomyolysis     Past  Surgical History:  Procedure Laterality Date  . ABDOMINAL HYSTERECTOMY    . APPENDECTOMY    . BREAST ENHANCEMENT SURGERY    . CATARACT EXTRACTION    . FEMUR IM NAIL Left 02/17/2017   Procedure: INTRAMEDULLARY (IM) NAIL FEMORAL;  Surgeon: Nicholes Stairs, MD;  Location: [facility];  Service: Orthopedics;  Laterality: Left;  . HIP ARTHROPLASTY Right 12/24/2015   Procedure: ARTHROPLASTY BIPOLAR HIP (HEMIARTHROPLASTY);  Surgeon: Netta Cedars, MD;  Location: [facility];  Service: Orthopedics;  Laterality: Right;  . TONSILLECTOMY      Social History   Socioeconomic History  . Marital status: Widowed    Spouse name: Not on file  . Number of children: 3  . Years of education: HS  . Highest education level: Not on file  Occupational History  . Occupation: Retired    Fish farm manager: RETIRED  Tobacco Use  . Smoking status: Former Smoker    Years: 13.00    Quit date: 04/15/1976    Years since quitting: 44.4  . Smokeless tobacco: Never Used  Vaping Use  . Vaping Use: Never used  Substance and Sexual Activity  . Alcohol use: No  . Drug use: No  . Sexual activity: Not Currently  Other Topics Concern  . Not on file  Social History Narrative   Patient lives at home with her  grandson.   Caffeine Use: none   Social Determinants of Health   Financial Resource Strain:   . Difficulty of Paying Living Expenses: Not on file  Food Insecurity:   . Worried About Charity fundraiser in the Last Year: Not on file  . Ran Out of Food in the Last Year: Not on file  Transportation Needs:   . Lack of Transportation (Medical): Not on file  . Lack of Transportation (Non-Medical): Not on file  Physical Activity:   . Days of Exercise per Week: Not on file  . Minutes of Exercise per Session: Not on file  Stress:   . Feeling of Stress : Not on file  Social Connections:   . Frequency of Communication with Friends and Family: Not on file  . Frequency of Social Gatherings with Friends and Family: Not on file  .  Attends Religious Services: Not on file  . Active Member of Clubs or Organizations: Not on file  . Attends Archivist Meetings: Not on file  . Marital Status: Not on file  Intimate Partner Violence:   . Fear of Current or Ex-Partner: Not on file  . Emotionally Abused: Not on file  . Physically Abused: Not on file  . Sexually Abused: Not on file    Allergies  Allergen Reactions  . Hctz [Hydrochlorothiazide]     Significant and rapid hyponatremia (TIH)  . Amoxicillin-Pot Clavulanate Hives and Diarrhea    Has patient had a PCN reaction causing immediate rash, facial/tongue/throat swelling, SOB or lightheadedness with hypotension: yes Has patient had a PCN reaction causing severe rash involving mucus membranes or skin necrosis: yes Has patient had a PCN reaction that required hospitalization : unknown Has patient had a PCN reaction occurring within the last 10 years: unknown If all of the above answers are "NO", then may proceed with Cephalosporin use.   Marland Kitchen Lisinopril-Hydrochlorothiazide Other (See Comments)    Tired- no energy to do anything..  . Sulfa Antibiotics Hives and Nausea And Vomiting  . Sulfonamide Derivatives Hives    Family History  Problem Relation Age of Onset  . Heart Problems Mother     Prior to Admission medications   Medication Sig Start Date End Date Taking? Authorizing Provider  acetaminophen (TYLENOL) 500 MG tablet Take 500 mg by mouth every 6 (six) hours as needed for moderate pain.     [provider]  amLODipine (NORVASC) 2.5 MG tablet Take 1 tablet (2.5 mg total) by mouth daily. 05/18/20   Charlynne Cousins, MD  aspirin EC 81 MG EC tablet Take 1 tablet (81 mg total) by mouth daily. 09/15/19   Matcha, Beverely Pace, MD  feeding supplement, ENSURE ENLIVE, (ENSURE ENLIVE) LIQD Take 237 mLs by mouth 2 (two) times daily between meals. 03/10/20   Black, Lezlie Octave, NP  gabapentin (NEURONTIN) 100 MG capsule Take 1 capsule (100 mg total) by mouth 3  (three) times daily. Patient taking differently: Take 100 mg by mouth as needed (pain).  02/08/20   Harold Hedge, MD  Multiple Vitamin (MULTIVITAMIN WITH MINERALS) TABS tablet Take 1 tablet by mouth daily. 03/11/20   Radene Gunning, NP    Physical Exam: Vitals:   08/30/20 1200 08/30/20 1300 08/30/20 1400 08/30/20 1500  BP: (!) 142/80 140/74 122/74 136/83  Pulse: 89 90 92 84  Resp: 11 16 (!) 21 (!) 26  Temp:      TempSrc:      SpO2: 98% 99% 98% 97%     .  General:  Appears agitated and c/o hunger . Eyes:  PERRL, EOMI, normal lids, iris . ENT:  grossly normal hearing, lips & tongue, mmm; appropriate dentition . Neck:  no LAD, masses or thyromegaly . Cardiovascular:  RRR, no m/r/g. No LE edema.  Marland Kitchen Respiratory:   CTA bilaterally with no wheezes/rales/rhonchi.  Mildly increased respiratory effort. . Abdomen:  soft, NT, ND, NABS . Skin:  no rash or induration seen on limited exam . Musculoskeletal:  grossly normal tone BUE/BLE, no bony abnormality . Psychiatric:  Clearly altered mood and affect, speech fluent and appropriate, AOx3 . Neurologic:  CN 2-12 grossly intact, appears to move all extremities in coordinated fashion    Radiological Exams on Admission: DG Chest 1 View  Result Date: 08/29/2020 CLINICAL DATA:  Altered mental status EXAM: CHEST  1 VIEW COMPARISON:  05/12/2020 FINDINGS: Lungs are clear. No pneumothorax or pleural effusion. Cardiac size within normal limits. The pulmonary vascularity is normal. Moderate to severe thoracolumbar sigmoid scoliosis is again noted. IMPRESSION: No active disease. Electronically Signed   By: Fidela Salisbury MD   On: 08/29/2020 23:00   CT Head Wo Contrast  Result Date: 08/29/2020 CLINICAL DATA:  Delirium EXAM: CT HEAD WITHOUT CONTRAST TECHNIQUE: Contiguous axial images were obtained from the base of the skull through the vertex without intravenous contrast. COMPARISON:  05/12/2020 FINDINGS: Brain: Remote high right frontal subcortical white  matter and right cerebellar infarcts are again noted. Remote lacunar infarct within the right basal ganglia again noted. Mild to moderate parenchymal volume loss is commensurate with the patient's age. Moderate periventricular white matter changes are present likely reflecting the sequela of small vessel ischemia. No evidence of acute intracranial hemorrhage or infarct. No abnormal mass effect or midline shift. No abnormal intra or extra-axial mass lesion or fluid collection. Ventricular size is normal. Cerebellum is otherwise unremarkable. Vascular: No asymmetric hyperdense vasculature at the skull base. Skull: The calvarium is intact. Sinuses/Orbits: Minimal mucosal thickening in dependently layering mucus or fluid within the left sphenoid sinus. Remaining paranasal sinuses are clear. The orbits are unremarkable. Other: Mastoid air cells and middle ear cavities are clear IMPRESSION: Multiple remote infarcts. No evidence of acute intracranial hemorrhage or infarct. Electronically Signed   By: Fidela Salisbury MD   On: 08/29/2020 23:27    EKG: Independently reviewed.  NSR with rate 84; nonspecific ST changes with no evidence of acute ischemia, NSCSLT   Labs on Admission: I have personally reviewed the available labs and imaging studies at the time of the admission.  Pertinent labs:   Glucose 120 CK 29 Lactate 1.6 WBC 9.5 Platelets 473 Normal UA UDS negative ETOH <10 ASA <7   Assessment/Plan Principal Problem:   Acute metabolic encephalopathy Active Problems:   Chronic pain   Essential hypertension   AMS -Patient presenting with encephalopathy as evidenced by her altered speech and apparent confusion -While the patient may have underlying dementia (according to her grandson, not yet diagnosed), this is a change compared to her usual baseline mental status -Evaluation thus far unremarkable -There is no evidence of infection at this time -Suspect medication misadventure, likely associated  with opiates - although her grandson reports that she took all 84 of her opiates in 1 week and ran out 2 weeks ago -CVA is a consideration, although this would be an unusual presentation and MRI will be difficult based on her agitation - but will try to premedicate with Haldol and get it done -Based on unremarkable evaluation with current ability to  protect her airway, will observe for now with gentle IVF hydration and telemetry monitoring -50% of patients with delirium while hospitalized will be institutionalized at 6 months, and these patients have a 25% mortality at 6 months -The family would benefit from being referred to the [facility] and also provided with the [employer] website -Continue Aricept and Lexapro  Chronic pain -I have reviewed this patient in the [place] Controlled Substances Reporting System.  She is receiving medications from only one provider and appears to be taking them as prescribed. -She is not at particularly high risk of overdose but is at increased risk of opioid misuse or diversion. -She has been hospitalized for these issues in the past and reportedly ran out of her medications (all 90 pills!) this time within 8 days.  -It appears that she is unsafe to distribute her own medications going forth and it may be reasonable to attempt to taper off these medications, if possible. -Will give morphine 2 mg IV q2h prn severe pain  HTN -Continue Norvasc and ASA     Note: This patient has been tested and is negative for the novel coronavirus COVID-19.  DVT prophylaxis:  Lovenox Code Status:  Full - confirmed with family Family Communication: None present; I spoke with the patient's grandson by telephone at the time of admission. Disposition Plan:  The patient is from: home  Anticipated d/c is to: home with [facility]; she lives alone and this does not currently appear to be a safe disposition for the patient  Anticipated d/c date will depend on clinical  response to treatment, but possibly as early as tomorrow if she has excellent response to treatment  Patient is currently: acutely ill Consults called: TOC team Admission status:  It is my clinical opinion that referral for OBSERVATION is reasonable and necessary in this patient based on the above information provided. The aforementioned taken together are felt to place the patient at high risk for further clinical deterioration. However it is anticipated that the patient may be medically stable for discharge from the hospital within 24 to 48 hours.    Karmen Bongo MD [facility]   How to contact the [facility] Attending or Consulting provider [place] or covering provider during after hours [place], for this patient?  1. Check the care team in [facility] and look for a) attending/consulting [facility] provider listed and b) the [facility] team listed 2. Log into [email or web address] and use [place]'s universal password to access. If you do not have the password, please contact the hospital operator. 3. Locate the [facility] provider you are looking for under [facility] and page to a number that you can be directly reached. 4. If you still have difficulty reaching the provider, please page the [facility] (Director on Call) for the Hospitalists listed on amion for assistance.   08/30/2020, 5:53 PM

## 2020-08-31 ENCOUNTER — Observation Stay (HOSPITAL_COMMUNITY): Payer: Medicare Other

## 2020-08-31 ENCOUNTER — Inpatient Hospital Stay (HOSPITAL_COMMUNITY): Payer: Medicare Other

## 2020-08-31 DIAGNOSIS — Z96641 Presence of right artificial hip joint: Secondary | ICD-10-CM | POA: Diagnosis present

## 2020-08-31 DIAGNOSIS — E43 Unspecified severe protein-calorie malnutrition: Secondary | ICD-10-CM | POA: Diagnosis present

## 2020-08-31 DIAGNOSIS — I10 Essential (primary) hypertension: Secondary | ICD-10-CM | POA: Diagnosis present

## 2020-08-31 DIAGNOSIS — Z86711 Personal history of pulmonary embolism: Secondary | ICD-10-CM | POA: Diagnosis not present

## 2020-08-31 DIAGNOSIS — Z681 Body mass index (BMI) 19 or less, adult: Secondary | ICD-10-CM | POA: Diagnosis not present

## 2020-08-31 DIAGNOSIS — G5790 Unspecified mononeuropathy of unspecified lower limb: Secondary | ICD-10-CM | POA: Diagnosis present

## 2020-08-31 DIAGNOSIS — I63412 Cerebral infarction due to embolism of left middle cerebral artery: Secondary | ICD-10-CM | POA: Diagnosis not present

## 2020-08-31 DIAGNOSIS — Z7189 Other specified counseling: Secondary | ICD-10-CM | POA: Diagnosis not present

## 2020-08-31 DIAGNOSIS — R29716 NIHSS score 16: Secondary | ICD-10-CM | POA: Diagnosis not present

## 2020-08-31 DIAGNOSIS — G894 Chronic pain syndrome: Secondary | ICD-10-CM | POA: Diagnosis present

## 2020-08-31 DIAGNOSIS — I63512 Cerebral infarction due to unspecified occlusion or stenosis of left middle cerebral artery: Principal | ICD-10-CM

## 2020-08-31 DIAGNOSIS — R29718 NIHSS score 18: Secondary | ICD-10-CM | POA: Diagnosis not present

## 2020-08-31 DIAGNOSIS — G9341 Metabolic encephalopathy: Secondary | ICD-10-CM | POA: Diagnosis present

## 2020-08-31 DIAGNOSIS — Z87891 Personal history of nicotine dependence: Secondary | ICD-10-CM | POA: Diagnosis not present

## 2020-08-31 DIAGNOSIS — I272 Pulmonary hypertension, unspecified: Secondary | ICD-10-CM | POA: Diagnosis present

## 2020-08-31 DIAGNOSIS — I639 Cerebral infarction, unspecified: Secondary | ICD-10-CM | POA: Diagnosis present

## 2020-08-31 DIAGNOSIS — R29715 NIHSS score 15: Secondary | ICD-10-CM | POA: Diagnosis not present

## 2020-08-31 DIAGNOSIS — R5381 Other malaise: Secondary | ICD-10-CM | POA: Diagnosis not present

## 2020-08-31 DIAGNOSIS — H9190 Unspecified hearing loss, unspecified ear: Secondary | ICD-10-CM | POA: Diagnosis present

## 2020-08-31 DIAGNOSIS — Z8701 Personal history of pneumonia (recurrent): Secondary | ICD-10-CM | POA: Diagnosis not present

## 2020-08-31 DIAGNOSIS — R29714 NIHSS score 14: Secondary | ICD-10-CM | POA: Diagnosis not present

## 2020-08-31 DIAGNOSIS — E559 Vitamin D deficiency, unspecified: Secondary | ICD-10-CM | POA: Diagnosis present

## 2020-08-31 DIAGNOSIS — Z20822 Contact with and (suspected) exposure to covid-19: Secondary | ICD-10-CM | POA: Diagnosis present

## 2020-08-31 DIAGNOSIS — R29722 NIHSS score 22: Secondary | ICD-10-CM | POA: Diagnosis not present

## 2020-08-31 DIAGNOSIS — R4182 Altered mental status, unspecified: Secondary | ICD-10-CM | POA: Diagnosis present

## 2020-08-31 DIAGNOSIS — F0390 Unspecified dementia without behavioral disturbance: Secondary | ICD-10-CM | POA: Diagnosis present

## 2020-08-31 DIAGNOSIS — Z515 Encounter for palliative care: Secondary | ICD-10-CM | POA: Diagnosis not present

## 2020-08-31 DIAGNOSIS — G8191 Hemiplegia, unspecified affecting right dominant side: Secondary | ICD-10-CM | POA: Diagnosis present

## 2020-08-31 DIAGNOSIS — E785 Hyperlipidemia, unspecified: Secondary | ICD-10-CM | POA: Diagnosis present

## 2020-08-31 DIAGNOSIS — I6389 Other cerebral infarction: Secondary | ICD-10-CM | POA: Diagnosis not present

## 2020-08-31 DIAGNOSIS — R4701 Aphasia: Secondary | ICD-10-CM | POA: Diagnosis present

## 2020-08-31 LAB — BASIC METABOLIC PANEL
Anion gap: 12 (ref 5–15)
BUN: 24 mg/dL — ABNORMAL HIGH (ref 8–23)
CO2: 22 mmol/L (ref 22–32)
Calcium: 9.7 mg/dL (ref 8.9–10.3)
Chloride: 104 mmol/L (ref 98–111)
Creatinine, Ser: 0.75 mg/dL (ref 0.44–1.00)
GFR calc Af Amer: >60 mL/min (ref >60)
GFR calc non Af Amer: >60 mL/min (ref >60)
Glucose, Bld: 99 mg/dL (ref 70–99)
Potassium: 3.5 mmol/L (ref 3.5–5.1)
Sodium: 138 mmol/L (ref 135–145)

## 2020-08-31 LAB — CBC
HCT: 42.5 % (ref 36.0–46.0)
Hemoglobin: 13.9 g/dL (ref 12.0–15.0)
MCH: 28.4 pg (ref 26.0–34.0)
MCHC: 32.7 g/dL (ref 30.0–36.0)
MCV: 86.9 fL (ref 80.0–100.0)
Platelets: 418 10*3/uL — ABNORMAL HIGH (ref 150–400)
RBC: 4.89 MIL/uL (ref 3.87–5.11)
RDW: 13.6 % (ref 11.5–15.5)
WBC: 10.3 10*3/uL (ref 4.0–10.5)
nRBC: 0 % (ref 0.0–0.2)

## 2020-08-31 LAB — TSH: TSH: 0.964 u[IU]/mL (ref 0.350–4.500)

## 2020-08-31 LAB — LIPID PANEL
Cholesterol: 161 mg/dL (ref 0–200)
HDL: 62 mg/dL (ref >40)
LDL Cholesterol: 85 mg/dL (ref 0–99)
Total CHOL/HDL Ratio: 2.6 RATIO
Triglycerides: 71 mg/dL (ref <150)
VLDL: 14 mg/dL (ref 0–40)

## 2020-08-31 LAB — HEMOGLOBIN A1C
Hgb A1c MFr Bld: 6 % — ABNORMAL HIGH (ref 4.8–5.6)
Mean Plasma Glucose: 125.5 mg/dL

## 2020-08-31 LAB — VITAMIN B12: Vitamin B-12: 342 pg/mL (ref 180–914)

## 2020-08-31 LAB — VITAMIN D 25 HYDROXY (VIT D DEFICIENCY, FRACTURES): Vit D, 25-Hydroxy: 23.01 ng/mL — ABNORMAL LOW (ref 30–100)

## 2020-08-31 MED ORDER — POTASSIUM CHLORIDE 20 MEQ/15ML (10%) PO SOLN
40.0000 meq | Freq: Every day | ORAL | Status: DC
  Administered 2020-09-01: 40 meq via ORAL
  Filled 2020-08-31 (×2): qty 30

## 2020-08-31 MED ORDER — POLYETHYLENE GLYCOL 3350 17 G PO PACK: 17.0000 g | PACK | Freq: Every day | ORAL | Status: DC | PRN

## 2020-08-31 MED ORDER — SENNOSIDES-DOCUSATE SODIUM 8.6-50 MG PO TABS
2.0000 | ORAL_TABLET | Freq: Every evening | ORAL | Status: DC | PRN
  Administered 2020-09-02: 2 via ORAL
  Filled 2020-08-31: qty 2

## 2020-08-31 NOTE — Progress Notes (Signed)
Attempted echo. Patient refused.
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
## 2020-08-31 NOTE — Evaluation (Addendum)
I agree with the following treatment note after review of the documentation. This session was performed under the supervision of a licensed clinician.   Lou Miner, DPT  Acute Rehabilitation Services  Pager: [phone]   Physical Therapy Evaluation Patient Details Name: Angelica Bell MRN: [id number] DOB: 03-03-1932 Today's Date: 08/31/2020   History of Present Illness  Pt is a 86 yo female who presents to the hospital with a R facial droop, dysarthria and R sided weakness. Pt was noted to have a L frontoparietal, insular and occipitotemporal infarct. Pt has a PMH of Pneumonia, PE, HTN, HOH and Pulmonary HTN  Clinical Impression  Pt was evaluated for above diagnosis and impairments below. Pt required +2 total assist for all bed mobility tasks assessed. Pt's knees stuck in flexion secondary to bed malfunction, so mobility limited; RN aware. Severe R hemiplegia and flaccidity noted. Pt difficult to understand secondary to expressive difficulties, so PLOF and home information unable to be obtained. Recommend SNF level therapy and 24/7 assist at d/c Pt would continue to benefit from acute therapy in order to increase her independence with functional tasks and ensure safety with mobility. Will continue to follow acutely.     Follow Up Recommendations SNF;Supervision/Assistance - 24 hour    Equipment Recommendations  Wheelchair (measurements PT);Wheelchair cushion (measurements PT);Other (comment) (hoyer and hoyer lift)    Recommendations for Other Services OT consult;Speech consult     Precautions / Restrictions Precautions Precautions: Fall Restrictions Weight Bearing Restrictions: No      Mobility  Bed Mobility Overal bed mobility: Needs Assistance Bed Mobility: Rolling;Supine to Sit;Sit to Supine Rolling: Total assist;+2 for physical assistance   Supine to sit: Total assist;+2 for physical assistance Sit to supine: Total assist;+2 for physical assistance   General bed  mobility comments: pt's bed was not working and knees stuck in flexion; unable to flatten bed. Pt required total assist +2 to roll X 3. Pt requiring total A +2 to come into long sitting with assist at trunk. Pt was noted to help <25% with RUE. Was unable to perform further mobility as bed was broken.  Transfers                    Ambulation/Gait                Stairs            Wheelchair Mobility    Modified Rankin (Stroke Patients Only)       Balance                      Pertinent Vitals/Pain Pain Assessment: Faces Faces Pain Scale: Hurts little more Pain Location: generalized Pain Descriptors / Indicators: Grimacing;Guarding Pain Intervention(s): Limited activity within patient's tolerance;Repositioned    Home Living Family/patient expects to be discharged to:: Skilled nursing facility                 Additional Comments: Pt unable to provide home information secondary to expressive deficits    Prior Function Level of Independence: Needs assistance         Comments: per chart, pt was using WC for mobility around home, independent ADLs, limited IADLs      Hand Dominance        Extremity/Trunk Assessment   Upper Extremity Assessment Upper Extremity Assessment: Defer to OT evaluation;RUE deficits/detail RUE Deficits / Details: gross 0/5 MMT, unable to accurately perform sensation testing due to expressive difficulties, flaccidity noted  RUE Sensation:  (no response to painful stimuli) RUE Coordination: decreased fine motor;decreased gross motor    Lower Extremity Assessment Lower Extremity Assessment: RLE deficits/detail RLE Deficits / Details: gross 0/5 MMT, limited in sensation testing due to expressive difficulties, severe flaccidity noted along with Hip IR and flexion positioning upon entry. Pt was able to withdrawal slightly from pain.  RLE Sensation:  (response to painful stimuli) RLE Coordination: decreased gross  motor;decreased fine motor    Cervical / Trunk Assessment Cervical / Trunk Assessment: Kyphotic  Communication   Communication: HOH;Receptive difficulties;Expressive difficulties  Cognition Arousal/Alertness: Awake/alert Behavior During Therapy: Flat affect Overall Cognitive Status: Difficult to assess            General Comments: pt has a hx of dementia. Pt with expressive deficits and difficult to understand at times. Pt able to follow very simple commands occasionally, and responded well to tactile cues and demonstration. Pt kept reporting she was tired.      General Comments      Exercises Other Exercises Other Exercises: R side rolling x 3 with total assist with bed railing  Other Exercises: Supine to Long sitting x 3 with total assist   Assessment/Plan    PT Assessment Patient needs continued PT services  PT Problem List Decreased strength;Decreased activity tolerance;Decreased balance;Decreased coordination;Decreased cognition;Decreased knowledge of use of DME;Decreased safety awareness;Decreased knowledge of precautions;Pain;Decreased mobility;Decreased range of motion       PT Treatment Interventions DME instruction;Gait training;Functional mobility training;Therapeutic activities;Therapeutic exercise;Balance training;Neuromuscular re-education;Cognitive remediation;Patient/family education;Wheelchair mobility training    PT Goals (Current goals can be found in the Care Plan section)  Acute Rehab PT Goals Patient Stated Goal: none stated PT Goal Formulation: Patient unable to participate in goal setting Time For Goal Achievement: 09/14/20 Potential to Achieve Goals: Fair    Frequency Min 3X/week   Barriers to discharge        Co-evaluation               AM-PAC PT "6 Clicks" Mobility  Outcome Measure Help needed turning from your back to your side while in a flat bed without using bedrails?: Total Help needed moving from lying on your back to sitting  on the side of a flat bed without using bedrails?: Total Help needed moving to and from a bed to a chair (including a wheelchair)?: Total Help needed standing up from a chair using your arms (e.g., wheelchair or bedside chair)?: Total Help needed to walk in hospital room?: Total Help needed climbing 3-5 steps with a railing? : Total 6 Click Score: 6    End of Session   Activity Tolerance: Patient limited by fatigue Patient left: in bed;with call bell/phone within reach;with bed alarm set Nurse Communication: Mobility status (pt's bed broken) PT Visit Diagnosis: Muscle weakness (generalized) (M62.81);Hemiplegia and hemiparesis;Pain Hemiplegia - Right/Left: Right Hemiplegia - caused by: Cerebral infarction Pain - part of body:  (generalized)    Time: 3474-2595 PT Time Calculation (min) (ACUTE ONLY): 18 min   Charges:   PT Evaluation $PT Eval Moderate Complexity: 1 Mod         Gloriann Loan, SPT  Acute Rehabilitation Services  Office: [phone]  08/31/2020, 5:58 PM
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
## 2020-09-01 ENCOUNTER — Other Ambulatory Visit: Payer: Self-pay | Admitting: Medical

## 2020-09-01 ENCOUNTER — Inpatient Hospital Stay (HOSPITAL_COMMUNITY): Payer: Medicare Other

## 2020-09-01 DIAGNOSIS — Z7189 Other specified counseling: Secondary | ICD-10-CM

## 2020-09-01 DIAGNOSIS — I639 Cerebral infarction, unspecified: Secondary | ICD-10-CM

## 2020-09-01 DIAGNOSIS — Z515 Encounter for palliative care: Secondary | ICD-10-CM

## 2020-09-01 DIAGNOSIS — I6389 Other cerebral infarction: Secondary | ICD-10-CM

## 2020-09-01 LAB — CBC
HCT: 37.7 % (ref 36.0–46.0)
Hemoglobin: 12 g/dL (ref 12.0–15.0)
MCH: 27.6 pg (ref 26.0–34.0)
MCHC: 31.8 g/dL (ref 30.0–36.0)
MCV: 86.9 fL (ref 80.0–100.0)
Platelets: 367 10*3/uL (ref 150–400)
RBC: 4.34 MIL/uL (ref 3.87–5.11)
RDW: 13.2 % (ref 11.5–15.5)
WBC: 10.2 10*3/uL (ref 4.0–10.5)
nRBC: 0 % (ref 0.0–0.2)

## 2020-09-01 LAB — FOLATE RBC
Folate, Hemolysate: 477 ng/mL
Folate, RBC: 1092 ng/mL (ref >498)
Hematocrit: 43.7 % (ref 34.0–46.6)

## 2020-09-01 LAB — BASIC METABOLIC PANEL
Anion gap: 9 (ref 5–15)
BUN: 19 mg/dL (ref 8–23)
CO2: 23 mmol/L (ref 22–32)
Calcium: 9.5 mg/dL (ref 8.9–10.3)
Chloride: 104 mmol/L (ref 98–111)
Creatinine, Ser: 0.77 mg/dL (ref 0.44–1.00)
GFR calc Af Amer: >60 mL/min (ref >60)
GFR calc non Af Amer: >60 mL/min (ref >60)
Glucose, Bld: 121 mg/dL — ABNORMAL HIGH (ref 70–99)
Potassium: 4 mmol/L (ref 3.5–5.1)
Sodium: 136 mmol/L (ref 135–145)

## 2020-09-01 LAB — MAGNESIUM: Magnesium: 1.6 mg/dL — ABNORMAL LOW (ref 1.7–2.4)

## 2020-09-01 LAB — ECHOCARDIOGRAM COMPLETE

## 2020-09-01 MED ORDER — OXYCODONE HCL 5 MG PO TABS
5.0000 mg | ORAL_TABLET | Freq: Four times a day (QID) | ORAL | Status: DC | PRN
  Administered 2020-09-01 – 2020-09-05 (×6): 5 mg via ORAL
  Filled 2020-09-01 (×7): qty 1

## 2020-09-01 MED ORDER — CLOPIDOGREL BISULFATE 75 MG PO TABS
75.0000 mg | ORAL_TABLET | Freq: Every day | ORAL | Status: DC
  Administered 2020-09-01 – 2020-09-05 (×5): 75 mg via ORAL
  Filled 2020-09-01 (×5): qty 1

## 2020-09-01 MED ORDER — VITAMIN D 25 MCG (1000 UNIT) PO TABS
1000.0000 [IU] | ORAL_TABLET | Freq: Every day | ORAL | Status: DC
  Administered 2020-09-02 – 2020-09-05 (×4): 1000 [IU] via ORAL
  Filled 2020-09-01 (×4): qty 1

## 2020-09-01 MED ORDER — ATORVASTATIN CALCIUM 40 MG PO TABS
40.0000 mg | ORAL_TABLET | Freq: Every day | ORAL | Status: DC
  Administered 2020-09-01 – 2020-09-05 (×5): 40 mg via ORAL
  Filled 2020-09-01 (×5): qty 1

## 2020-09-01 NOTE — Progress Notes (Signed)
   Cardiology asked to order 30 day event monitor to further evaluate for etiology of her stroke. She does not follow with cardiology outpatient. Will order for Dr. Oval Linsey to read (DOD 09/01/20).  Abigail Butts, PA-C  09/01/20; 3:58 PM

## 2020-09-01 NOTE — NC FL2 (Signed)
[place] MEDICAID FL2 LEVEL OF CARE SCREENING TOOL     IDENTIFICATION  Patient Name: Angelica Bell Birthdate: 03-13-1932 Sex: female Admission Date (Current Location): 08/29/2020  [facility] and [place] Number:  Herbalist and Address:  [facility] [place] [place] [address], [place], [place] [address]      Provider Number: [id number]  Attending Physician Name and Address:  Damita Lack, MD  Relative Name and Phone Number:       Current Level of Care: Hospital Recommended Level of Care: [place] Prior Approval Number:    Date Approved/Denied:   PASRR Number: [id number]  Discharge Plan: SNF    Current Diagnoses: Patient Active Problem List   Diagnosis Date Noted  . Palliative care by specialist   . Goals of care, counseling/discussion   . DNR (do not resuscitate) discussion   . Acute CVA (cerebrovascular accident) ([place]) 08/31/2020  . Acute metabolic encephalopathy 56/21/3086  . Acute respiratory failure with hypoxia ([place]) 05/17/2020  . Opioid overdose ([place]) 05/13/2020  . DNR (do not resuscitate) 03/09/2020  . Frequent falls 03/09/2020  . Multiple rib fractures 03/08/2020  . Common bile duct dilatation 03/08/2020  . Leukocytosis 03/08/2020  . Normocytic anemia 03/08/2020  . Hypomagnesemia 02/08/2020  . Fall 02/06/2020  . AKI (acute kidney injury) ([place]) 02/05/2020  . UTI (urinary tract infection) 09/01/2019  . Rhabdomyolysis 09/01/2019  . Shock ([place]) 08/31/2019  . Acute cystitis 08/03/2019  . Confusion and disorientation 08/03/2019  . Cerebrovascular accident (CVA) ([place])   . ARF (acute renal failure) ([place]) 07/26/2019  . Hyponatremia 07/26/2019  . Abnormal liver function 07/26/2019  . Peripheral neuropathy   . Drug overdose   . Polypharmacy   . Altered mental status 10/25/2017  . Closed displaced intertrochanteric fracture of left femur ([place]) 02/16/2017  . Hip fracture, right ([place]) 12/25/2015  . Chronic pain  12/24/2015  . Fracture of femoral neck, right, closed ([place]) 12/24/2015  . Essential hypertension 12/24/2015  . Community acquired pneumonia 04/15/2013  . Hypokalemia 04/15/2013  . Labial cyst 02/08/2012  . POSTMENOPAUSAL SYNDROME 11/28/2009  . Mononeuritis of lower limb 02/22/2009  . INSOMNIA-SLEEP DISORDER-UNSPEC 10/12/2008  . Lumbago 08/02/2008  . 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 ANXIETY DEPRESSION 12/16/2007  . Hyperlipidemia 07/08/2007  . Osteoarthritis 07/08/2007  . Osteoporosis 07/08/2007  . SCOLIOSIS NEC 07/08/2007    Orientation RESPIRATION BLADDER Height & Weight     Self  Normal Incontinent Weight:   Height:     BEHAVIORAL SYMPTOMS/MOOD NEUROLOGICAL BOWEL NUTRITION STATUS      Incontinent Diet (see DC summary)  AMBULATORY STATUS COMMUNICATION OF NEEDS Skin   Extensive Assist Verbally Normal                       Personal Care Assistance Level of Assistance  Bathing, Feeding, Dressing Bathing Assistance: Maximum assistance Feeding assistance: Maximum assistance Dressing Assistance: Maximum assistance     Functional Limitations Info  Speech     Speech Info: Impaired (dysarthria, aphasia)    SPECIAL CARE FACTORS FREQUENCY  PT (By licensed PT), OT (By licensed OT), Speech therapy     PT Frequency: 5x/wk OT Frequency: 5x/wk     Speech Therapy Frequency: 5x/wk      Contractures Contractures Info: Not present    Additional Factors Info  Code Status, Allergies, Psychotropic Code Status Info: Full Allergies Info: Hctz (Hydrochlorothiazide), Amoxicillin-pot Clavulanate, Lisinopril-hydrochlorothiazide, Sulfa Antibiotics, Sulfonamide Derivatives Psychotropic Info: Aricept 5mg  daily, Lexapro 20mg   daily         Current Medications (09/01/2020):  This is the current hospital active medication list Current Facility-Administered Medications  Medication Dose Route Frequency Provider Last Rate Last Admin  . acetaminophen (TYLENOL) tablet  650 mg  650 mg Oral Q6H PRN Karmen Bongo, MD   650 mg at 09/01/20 1011   Or  . acetaminophen (TYLENOL) suppository 650 mg  650 mg Rectal Q6H PRN Karmen Bongo, MD      . amLODipine (NORVASC) tablet 2.5 mg  2.5 mg Oral Daily Karmen Bongo, MD   2.5 mg at 09/01/20 1021  . aspirin EC tablet 81 mg  81 mg Oral Daily Karmen Bongo, MD   81 mg at 09/01/20 1021  . atorvastatin (LIPITOR) tablet 40 mg  40 mg Oral Daily Amin, Ankit Chirag, MD   40 mg at 09/01/20 1132  . [START ON 09/02/2020] cholecalciferol (VITAMIN D3) tablet 1,000 Units  1,000 Units Oral Daily Amin, Ankit Chirag, MD      . clopidogrel (PLAVIX) tablet 75 mg  75 mg Oral Daily Amin, Ankit Chirag, MD   75 mg at 09/01/20 1132  . donepezil (ARICEPT) tablet 5 mg  5 mg Oral Daily Karmen Bongo, MD   5 mg at 09/01/20 1021  . enoxaparin (LOVENOX) injection 40 mg  40 mg Subcutaneous Q24H Karmen Bongo, MD   40 mg at 09/01/20 1132  . escitalopram (LEXAPRO) tablet 20 mg  20 mg Oral Daily Karmen Bongo, MD   20 mg at 09/01/20 1021  . haloperidol lactate (HALDOL) injection 2 mg  2 mg Intravenous Q6H PRN Karmen Bongo, MD      . lactated ringers infusion   Intravenous Continuous Karmen Bongo, MD 50 mL/hr at 09/01/20 1025 New Bag at 09/01/20 1025  . morphine 2 MG/ML injection 2 mg  2 mg Intravenous Q2H PRN Karmen Bongo, MD      . ondansetron [facility]) tablet 4 mg  4 mg Oral Q6H PRN Karmen Bongo, MD       Or  . ondansetron [facility]) injection 4 mg  4 mg Intravenous Q6H PRN Karmen Bongo, MD   4 mg at 08/31/20 1957  . oxyCODONE (Oxy IR/ROXICODONE) immediate release tablet 5 mg  5 mg Oral Q6H PRN Amin, Ankit Chirag, MD   5 mg at 09/01/20 1131  . polyethylene glycol (MIRALAX / GLYCOLAX) packet 17 g  17 g Oral Daily PRN Amin, Ankit Chirag, MD      . potassium chloride 20 MEQ/15ML (10%) solution 40 mEq  40 mEq Oral Daily Amin, Ankit Chirag, MD   40 mEq at 09/01/20 1019  . senna-docusate (Senokot-S) tablet 2 tablet  2 tablet Oral QHS PRN  Amin, Ankit Chirag, MD      . sodium chloride flush (NS) 0.9 % injection 3 mL  3 mL Intravenous Q12H Karmen Bongo, MD   3 mL at 09/01/20 0206     Discharge Medications: Please see discharge summary for a list of discharge medications.  Relevant Imaging Results:  Relevant Lab Results:   Additional Information SS#: [id number]  Geralynn Ochs, LCSW

## 2020-09-01 NOTE — Evaluation (Signed)
Clinical/Bedside Swallow Evaluation Patient Details  Name: Angelica Bell MRN: [id number] Date of Birth: 09/13/1932  Today's Date: 09/01/2020 Time: SLP Start Time (ACUTE ONLY): 0945 SLP Stop Time (ACUTE ONLY): 1021 SLP Time Calculation (min) (ACUTE ONLY): 36 min  Past Medical History:  Past Medical History:  Diagnosis Date  . Chronic back pain    with degenerative joint disease  . Chronic pain disorder    with narcotic management  . Dry eyes   . Frequent falls   . HOH (hard of hearing)   . Hypertension   . Multiple rib fractures 02/2020  . Osteoporosis   . PE (pulmonary embolism)    After hysterectomy in 1967  . Pneumonia   . Rhabdomyolysis    Past Surgical History:  Past Surgical History:  Procedure Laterality Date  . ABDOMINAL HYSTERECTOMY    . APPENDECTOMY    . BREAST ENHANCEMENT SURGERY    . CATARACT EXTRACTION    . FEMUR IM NAIL Left 02/17/2017   Procedure: INTRAMEDULLARY (IM) NAIL FEMORAL;  Surgeon: Nicholes Stairs, MD;  Location: [facility];  Service: Orthopedics;  Laterality: Left;  . HIP ARTHROPLASTY Right 12/24/2015   Procedure: ARTHROPLASTY BIPOLAR HIP (HEMIARTHROPLASTY);  Surgeon: Netta Cedars, MD;  Location: [facility];  Service: Orthopedics;  Laterality: Right;  . TONSILLECTOMY     HPI:  86 year old with history of remote PE, chronic pain, HTN, possible mild dementia, presented with altered mental status with concerns of possible overdose.  A day prior to admission she was in her usual mentation but later on she was very confused therefore brought to the hospital.  Apparently she was given 90 pills of oxycodone about a month ago but within less than 10 days she had finished all of them. MRI shows Acute left frontoparietal, insular and occipital temporal region infarct. Pt passed RN stroke swallow, but has been observed to cough with thin liquids. RN also states that she needs a cognitive linguistic eval which was not ordered initially.    Assessment / Plan /  Recommendation Clinical Impression  Pt demonstrates no signs of aspiration with water despite partially reclined position in bed (reports pain). Pt was able to take sips from a straw independently. She also self fed graham crackers and masticated adequately. Regular thin remains an appropriate diet, particularly given reports that pt can refuse food and drink. No f/u needed for dysphagia. See next note regarding cognitive linguistic evaluation.  SLP Visit Diagnosis: Dysphagia, unspecified (R13.10)    Aspiration Risk  Mild aspiration risk    Diet Recommendation Regular;Thin liquid   Liquid Administration via: Cup;Straw    Other  Recommendations     Follow up Recommendations None      Frequency and Duration            Prognosis        Swallow Study   General HPI: 86 year old with history of remote PE, chronic pain, HTN, possible mild dementia, presented with altered mental status with concerns of possible overdose.  A day prior to admission she was in her usual mentation but later on she was very confused therefore brought to the hospital.  Apparently she was given 90 pills of oxycodone about a month ago but within less than 10 days she had finished all of them. MRI shows Acute left frontoparietal, insular and occipital temporal region infarct. Pt passed RN stroke swallow, but has been observed to cough with thin liquids. RN also states that she needs a cognitive linguistic eval which was  not ordered initially.  Type of Study: Bedside Swallow Evaluation Previous Swallow Assessment: BSE during prior admits, WNL Diet Prior to this Study: Regular;Thin liquids Temperature Spikes Noted: No Respiratory Status: Room air History of Recent Intubation: No Behavior/Cognition: Alert;Cooperative Oral Cavity Assessment: Within Functional Limits Oral Care Completed by SLP: No Oral Cavity - Dentition: Dentures, top Vision: Functional for self-feeding Self-Feeding Abilities: Able to feed  self Patient Positioning: Upright in bed Baseline Vocal Quality: Normal Volitional Cough: Strong Volitional Swallow: Unable to elicit    Oral/Motor/Sensory Function Overall Oral Motor/Sensory Function: Mild impairment Facial ROM: Reduced right;Suspected CN VII (facial) dysfunction Facial Symmetry: Abnormal symmetry right;Suspected CN VII (facial) dysfunction Facial Strength: Reduced right;Suspected CN VII (facial) dysfunction Lingual ROM: Within Functional Limits Lingual Symmetry: Within Functional Limits Lingual Strength: Within Functional Limits   Ice Chips     Thin Liquid Thin Liquid: Within functional limits Presentation: Straw;Self Fed;Cup    Nectar Thick Nectar Thick Liquid: Not tested   Honey Thick Honey Thick Liquid: Not tested   Puree Puree: Within functional limits   Solid     Solid: Within functional limits Presentation: Damar Tullio Chausse, MA [employer] Pager [phone] Office [phone]  Othelia Pulling, Katherene Ponto 09/01/2020,10:37 AM

## 2020-09-01 NOTE — Consult Note (Addendum)
Palliative Medicine Inpatient Consult Note  Reason for consult:  Goals of Care "Goals of care, difficult family dynamic. Large acute cva"  HPI:  Per intake H&P --> Pt is a 86 yo female with of Pneumonia, PE, HTN, chronic pain (takes oxycodone) and Pulmonary HTN. Admitted for AMS, initially concern for overdose. (+) R facial droop, dysarthria and R sided weakness. Pt was noted to have a L frontoparietal, insular and occipitotemporal infarct.  Palliative care was asked to aid in [place] conversations in the setting of a significant stroke.   Clinical Assessment/Goals of Care: I have reviewed medical records including EPIC notes, labs and imaging, received report from bedside RN, assessed the patient. [name] was sitting up in bed. She was expressing that she wanted a phone number which was next to her bed, this was difficult to understand in the setting of her aphasia.    I met with Angelica Bell and her grandson, Angelica Bell to further discuss diagnosis prognosis, [place], EOL wishes, disposition and options.   I introduced Palliative Medicine as specialized medical care for people living with serious illness. It focuses on providing relief from the symptoms and stress of a serious illness. The goal is to improve quality of life for both the patient and the family.  [name] has a hard time expressing herself d/t aphasia though she and Angelica Bell assist one another to tell me a brief life review. She was originally from the [place] area though moved to [place] in the 1960's. She Has been married three times though all of her husbands have left her a widow. Her most recent husband had a tragic death while changing his cars oil as the car fell on him. She has a son and daughter. She raised her grandson Angelica Bell from the age of three days old as her daughter was very young when she gave birth to him. She is a former member of the [employer]. 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 She is a woman of faith and use to attend [employer].   In regard to [facility] functional state, she was living independently until this hospitalization. Her grandson would bring her meals though otherwise she was able to perform most bADLs. She had become fairly debilitated and had been more wheelchair dependent per her grandson.   Angelica Bell shares that he had spoke to Dr. Reesa Chew over the phone and is realizing that [name] will never be able to live independently again. He states that she will need 24/7 help. He expresses that this will be difficult financially. He does share that he has the help of his wifes family if needed. He states that he had applied for medicaid for Anneth multiple times though it has been denied due to her resources.  A detailed discussion was had today regarding advanced directives, patient does have a copy of these scanned into [employer] and her grandson Angelica Bell Case is enlisted as her Media planner.    I introduced a MOST form. Concepts specific to code status, artifical feeding and hydration, continued IV antibiotics and rehospitalization was had.  Atiya requested that we not talk about this, she got tearful and told Angelica Bell that he means the world to her and she does not want to leave him. I shared with her that I understand these are extremely difficult decisions though it is important to gain a better understanding of her wishes moving forward in the event that  something more significant occurs. I stated that in situation of resuscitation often times we can cause great injury and harm than benefit.   We discussed that her present state is exceptionally challenging as she is no longer able to clearly advocate for herself. I shared that it is imperative that she and Angelica Bell have these complicated conversations moving forward. She stated understanding.   The difference between a aggressive medical intervention path  and a palliative  comfort care path for this patient at this time was had. Values and goals of care important to patient and family were attempted to be elicited, [name] states that her grandson is the most important part of her life. She expresses not being ready to leave this world and wanting to remain in it longer to support him.   Discussed the importance of continued conversation with family and their  medical providers regarding overall plan of care and treatment options, ensuring decisions are within the context of the patients values and GOCs.  Provided a COPY of a MOST form and a "Hard Choices for [employer]" booklet.   Decision Maker: Angelica Bell Case [name]) [phone]  SUMMARY OF RECOMMENDATIONS   Full Code - Patient not ready to make this decision she and her grandson need to speak more in depth. Emphasized the importance of ongoing conversations given the severity of present disease  TOC --> OP palliative to continue goals conversations  Spiritual Support  Code Status/Advance Care Planning: FULL CODE   Symptom Management:  Generalized Pain:                 - Tylenol 680m PO Q6H PRN    - Oxycodone 564mPO Q6H PRN   Constipation:                 - Miralax 17g PO Qday PRN                 - Senna 2 tabs PO QHS PRN   Muscular Weakness:                 - Physical Therapy Evaluation                 - Occupational Therapy Evaluation  Aphasia:   - Speech Therapy   Palliative Prophylaxis:   Oral Care, Mobility, Pain  Additional Recommendations (Limitations, Scope, Preferences):  Continue current scope of care   Psycho-social/Spiritual:   Desire for further Chaplaincy support: Yes  Additional Recommendations: Education on Palliative care   Prognosis: Will depend upon ability to rehabilitate  Discharge Planning: Discharge to SNF with OP Palliative Care.  Oral Intake %:  Regular with thins eating reese's peanut butter cups I/O:  (-)[id number] Movements:  x2 Mobility:  Needs assistance seen by PT/OT SNF recommended  PPS: 30%   This conversation/these recommendations were discussed with patient primary care team, Dr. AmiReesa Chewime In: 1100 Time Out: 1210 Total Time: 70 Greater than 50%  of this time was spent counseling and coordinating care related to the above assessment and plan.  [facility] [place] Team Cell Phone: [phone] utilize secure chat with additional questions, if there is no response within 30 minutes please call the above phone number  Palliative Medicine Team providers are available by phone from 7am to 7pm daily and can be reached through the team cell phone.  Should this patient require assistance outside of these hours, please call the patient's attending physician.

## 2020-09-01 NOTE — Progress Notes (Signed)
  Echocardiogram 2D Echocardiogram has been performed.  Angelica Bell 09/01/2020, 3:56 PM

## 2020-09-01 NOTE — Evaluation (Signed)
Occupational Therapy Evaluation Patient Details Name: Angelica Bell MRN: [id number] DOB: 01-Jul-1932 Today's Date: 09/01/2020    History of Present Illness Pt is a 86 yo female who presents to the hospital with a R facial droop, dysarthria and R sided weakness. Pt was noted to have a L frontoparietal, insular and occipitotemporal infarct. Pt has a PMH of Pneumonia, PE, HTN, HOH and Pulmonary HTN   Clinical Impression   Pt admitted with above. She demonstrates the below listed deficits and will benefit from continued OT to maximize safety and independence with BADLs.  Pt presents to OT with Rt hemiplegia, impaired balance, decreased activity tolerance.  She requires max A for bed mobility and to move to EOB.  She initially required mod A for EOB sitting, but quickly progressed to max A. She requires total A for all aspects of ADLs and she was unable to achieve standing with max A.   PTA, it appears, she was living alone with her grandson's support.  I anticipate she will require 24 hour assist (mod - total A), at discharge.  Anticipate she will require SNF.  Will follow.       Follow Up Recommendations  SNF;Supervision/Assistance - 24 hour    Equipment Recommendations  Hospital bed;Other (comment) (hoyer lift )    Recommendations for Other Services       Precautions / Restrictions Precautions Precautions: Fall      Mobility Bed Mobility Overal bed mobility: Needs Assistance Bed Mobility: Rolling;Supine to Sit;Sit to Supine Rolling: Total assist   Supine to sit: Max assist Sit to supine: Max assist   General bed mobility comments: Pt is able to assist with lifting and lowering trunk   Transfers                 General transfer comment: attempted to stand with pt, but unable to achieve full standing with max A     Balance Overall balance assessment: Needs assistance Sitting-balance support: Feet supported;Single extremity supported Sitting balance-Leahy Scale:  Poor Sitting balance - Comments: initially required mod A for static sitting, but pt collapsed into flexion and then required max A                                    ADL either performed or assessed with clinical judgement   ADL Overall ADL's : Needs assistance/impaired                                       General ADL Comments: Pt required total A for all aspects.  She resists attempts to engage her in ADLs      Vision   Additional Comments: Pt appears to track therapist, but is unable to follow commands to complete formal assessment      Perception Perception Perception Tested?: No   Praxis Praxis Praxis tested?: Not tested    Pertinent Vitals/Pain Pain Assessment: Faces Faces Pain Scale: Hurts little more Pain Location: generalized Pain Descriptors / Indicators: Grimacing;Guarding Pain Intervention(s): Limited activity within patient's tolerance;Repositioned     Hand Dominance Right   Extremity/Trunk Assessment Upper Extremity Assessment Upper Extremity Assessment: RUE deficits/detail RUE Deficits / Details: No Active movement noted - appears flaccid  RUE Coordination: decreased fine motor;decreased gross motor   Lower Extremity Assessment Lower Extremity Assessment: Defer to PT evaluation  Cervical / Trunk Assessment Cervical / Trunk Assessment: Kyphotic   Communication Communication Communication: HOH;Receptive difficulties;Expressive difficulties   Cognition Arousal/Alertness: Awake/alert Behavior During Therapy: Flat affect Overall Cognitive Status: Difficult to assess                                 General Comments: Pt demonstrates significant communication deficit and has history of dementia    General Comments       Exercises     Shoulder Instructions      Home Living Family/patient expects to be discharged to:: Unsure                                 Additional Comments: Pt lived  with grandson PTA       Prior Functioning/Environment Level of Independence: Needs assistance  Gait / Transfers Assistance Needed: per last admission, pt utilized a w/c and was ? mod I with transfers  ADL's / Homemaking Assistance Needed: uncertain as no family present    Comments: per chart, pt was using WC for mobility around home, independent ADLs, limited IADLs         OT Problem List: Decreased strength;Decreased range of motion;Decreased activity tolerance;Impaired balance (sitting and/or standing);Decreased coordination;Decreased safety awareness;Decreased cognition;Decreased knowledge of use of DME or AE;Impaired UE functional use      OT Treatment/Interventions: Self-care/ADL training;DME and/or AE instruction;Therapeutic activities;Cognitive remediation/compensation;Visual/perceptual remediation/compensation;Patient/family education;Balance training;Manual therapy;Splinting;Neuromuscular education    OT Goals(Current goals can be found in the care plan section) Acute Rehab OT Goals OT Goal Formulation: Patient unable to participate in goal setting Time For Goal Achievement: 09/15/20 Potential to Achieve Goals: Good ADL Goals Pt Will Perform Grooming: with mod assist;sitting Pt Will Transfer to Toilet: with max assist;squat pivot transfer;bedside commode Pt Will Perform Toileting - Clothing Manipulation and hygiene: with max assist;sit to/from stand Additional ADL Goal #1: family will be independent with PROM and positioning of Rt UE Additional ADL Goal #2: Pt will maintain EOB sitting with min A x 5 mins in prep for ADLs  OT Frequency: Min 2X/week   Barriers to D/C:    unsure if family able to provide necessary level of assist        Co-evaluation              AM-PAC OT "6 Clicks" Daily Activity     Outcome Measure Help from another person eating meals?: Total Help from another person taking care of personal grooming?: Total Help from another person toileting,  which includes using toliet, bedpan, or urinal?: Total Help from another person bathing (including washing, rinsing, drying)?: Total Help from another person to put on and taking off regular upper body clothing?: Total Help from another person to put on and taking off regular lower body clothing?: Total 6 Click Score: 6   End of Session Nurse Communication: Mobility status  Activity Tolerance: Patient limited by fatigue;Patient limited by lethargy Patient left: in bed;with call bell/phone within reach;with bed alarm set  OT Visit Diagnosis: Cognitive communication deficit (R41.841);Hemiplegia and hemiparesis Symptoms and signs involving cognitive functions: Cerebral infarction Hemiplegia - Right/Left: Right Hemiplegia - dominant/non-dominant: Dominant Hemiplegia - caused by: Cerebral infarction                Time: 1425-1434 OT Time Calculation (min): 9 min Charges:  OT General Charges $OT Visit: 1 Visit OT Evaluation $OT  Eval Moderate Complexity: 1 Mod  Nilsa Nutting., OTR/L Acute Rehabilitation Services Pager [phone] Office [phone]   Lucille Passy M 09/01/2020, 3:37 PM

## 2020-09-01 NOTE — TOC Initial Note (Signed)
Transition of Care [facility]) - Initial/Assessment Note    Patient Details  Name: Angelica Bell MRN: [id number] Date of Birth: 1932-04-05  Transition of Care [facility]) CM/SW Contact:    Geralynn Ochs, LCSW Phone Number: 09/01/2020, 3:48 PM  Clinical Narrative:     CSW met with patient and grandson at bedside to discuss discharge recommendation for SNF. Patient said that she wanted to go home, but after explanation that she needed rehab, she nodded in agreement. CSW discussed options, and patient attempted to engage in discussion, but was difficult to understand. Grandson asked about [facility], as it is very close to his home and would be easy to get to, just in case she needs to stay long term. CSW completed referral and sent to [facility], reached out to them to discuss and left a Advertising account executive. Awaiting response from [facility]. Will initiate insurance authorization through [facility] to transition patient to SNF when stable.              Expected Discharge Plan: Skilled Nursing Facility Barriers to Discharge: Insurance Authorization, Continued Medical Work up   Patient Goals and CMS Choice Patient states their goals for this hospitalization and ongoing recovery are:: patient unable to participate in goal setting due to disorientation CMS Medicare.gov Compare Post Acute Care list provided to:: Patient Represenative (must comment) Choice offered to / list presented to : [name] / Guardian  Expected Discharge Plan and Services Expected Discharge Plan: [place] Choice: [place] Living arrangements for the past 2 months: Single Family Home                                      Prior Living Arrangements/Services Living arrangements for the past 2 months: Single Family Home Lives with:: Self Patient language and need for interpreter reviewed:: No Do you feel safe going back to the place where you live?: Yes      Need  for Family Participation in Patient Care: Yes (Comment) Care giver support system in place?: No (comment) Current home services: DME Criminal Activity/Legal Involvement Pertinent to Current Situation/Hospitalization: No - Comment as needed  Activities of Daily Living      Permission Sought/Granted Permission sought to share information with : Facility Sport and exercise psychologist, Family Supports Permission granted to share information with : Yes, Verbal Permission Granted  Share Information with NAME: [name] [name])  Permission granted to share info w AGENCY: SNF  Permission granted to share info w Relationship: Grandson     Emotional Assessment Appearance:: Appears stated age Attitude/Demeanor/Rapport: Unable to Assess Affect (typically observed): Unable to Assess Orientation: : Oriented to Self Alcohol / Substance Use: Not Applicable Psych Involvement: No (comment)  Admission diagnosis:  Altered mental status, unspecified altered mental status type [K09.38] Acute metabolic encephalopathy [H82.99] Acute CVA (cerebrovascular accident) [facility]) [I63.9] Patient Active Problem List   Diagnosis Date Noted  . Palliative care by specialist   . Goals of care, counseling/discussion   . DNR (do not resuscitate) discussion   . Acute CVA (cerebrovascular accident) ([place]) 08/31/2020  . Acute metabolic encephalopathy 37/16/9678  . Acute respiratory failure with hypoxia ([place]) 05/17/2020  . Opioid overdose ([place]) 05/13/2020  . DNR (do not resuscitate) 03/09/2020  . Frequent falls 03/09/2020  . Multiple rib fractures 03/08/2020  . Common bile duct dilatation 03/08/2020  . Leukocytosis 03/08/2020  . Normocytic  anemia 03/08/2020  . Hypomagnesemia 02/08/2020  . Fall 02/06/2020  . AKI (acute kidney injury) ([place]) 02/05/2020  . UTI (urinary tract infection) 09/01/2019  . Rhabdomyolysis 09/01/2019  . Shock ([place]) 08/31/2019  . Acute cystitis 08/03/2019  . Confusion and disorientation 08/03/2019  .  Cerebrovascular accident (CVA) ([place])   . ARF (acute renal failure) ([place]) 07/26/2019  . Hyponatremia 07/26/2019  . Abnormal liver function 07/26/2019  . Peripheral neuropathy   . Drug overdose   . Polypharmacy   . Altered mental status 10/25/2017  . Closed displaced intertrochanteric fracture of left femur ([place]) 02/16/2017  . Hip fracture, right ([place]) 12/25/2015  . Chronic pain 12/24/2015  . Fracture of femoral neck, right, closed ([place]) 12/24/2015  . Essential hypertension 12/24/2015  . Community acquired pneumonia 04/15/2013  . Hypokalemia 04/15/2013  . Labial cyst 02/08/2012  . POSTMENOPAUSAL SYNDROME 11/28/2009  . Mononeuritis of lower limb 02/22/2009  . INSOMNIA-SLEEP DISORDER-UNSPEC 10/12/2008  . Lumbago 08/02/2008  . 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 ANXIETY DEPRESSION 12/16/2007  . Hyperlipidemia 07/08/2007  . Osteoarthritis 07/08/2007  . Osteoporosis 07/08/2007  . SCOLIOSIS NEC 07/08/2007   PCP:  Heywood Bene, PA-C Pharmacy:   [facility], [place] - [address] [place] [address] Phone: [phone] Fax: [facility], [place] - [address] [place] [address] [place] [address] Phone: [phone] Fax: [phone]     Social Determinants of Health (SDOH) Interventions    Readmission Risk Interventions Readmission Risk Prevention Plan 05/15/2020  Transportation Screening Complete  PCP or Specialist Appt within 3-5 Days Complete  HRI or [place] Complete  Social Work Consult for [place] Planning/Counseling Complete  Palliative Care Screening Complete  Medication Review Press photographer) Complete  Some recent data might be hidden

## 2020-09-01 NOTE — Progress Notes (Addendum)
STROKE TEAM PROGRESS NOTE   INTERVAL HISTORY No family at bedside. Patient  Still with some aphasia. Able to follow some commands.  I have personally reviewed history of presenting illness, electronic medical records and imaging films in PACS.  She presented with aphasia and mild right-sided weakness secondary to embolic left MCA branch infarct.  MRI of the brain shows no significant large vessel intracranial stenosis.  LDL cholesterol is 85 mg percent and hemoglobin A1c 6.0.  Vitals:   09/01/20 0000 09/01/20 0418 09/01/20 0806 09/01/20 1117  BP: (!) 158/93 (!) 147/79 125/78 125/68  Pulse: 87 91 92 83  Resp: 17 15 18 16   Temp: 98.1 F (36.7 C) 98.4 F (36.9 C) 98.1 F (36.7 C) 98.4 F (36.9 C)  TempSrc: Oral Oral Oral Oral  SpO2: 97% 97% 96% 97%   CBC:  Recent Labs  Lab 08/29/20 2300 08/31/20 0256  WBC 9.5 10.3  NEUTROABS 5.6  --   HGB 14.9 13.9  HCT 46.0 42.5  MCV 87.8 86.9  PLT 473* 941*   Basic Metabolic Panel:  Recent Labs  Lab 08/29/20 2300 08/31/20 0256  NA 137 138  K 4.0 3.5  CL 102 104  CO2 23 22  GLUCOSE 120* 99  BUN 22 24*  CREATININE 0.81 0.75  CALCIUM 10.1 9.7   Lipid Panel:  Recent Labs  Lab 08/31/20 1242  CHOL 161  TRIG 71  HDL 62  CHOLHDL 2.6  VLDL 14  LDLCALC 85   HgbA1c:  Recent Labs  Lab 08/31/20 1242  HGBA1C 6.0*   Urine Drug Screen:  Recent Labs  Lab 08/30/20 0204  LABOPIA NONE DETECTED  COCAINSCRNUR NONE DETECTED  LABBENZ NONE DETECTED  AMPHETMU NONE DETECTED  THCU NONE DETECTED  LABBARB NONE DETECTED    Alcohol Level  Recent Labs  Lab 08/29/20 2300  ETH <10    IMAGING past 24 hours MR ANGIO HEAD WO CONTRAST  Result Date: 08/31/2020 CLINICAL DATA:  Possible stroke EXAM: MRA HEAD WITHOUT CONTRAST TECHNIQUE: Angiographic images of the Circle of Willis were obtained using MRA technique without intravenous contrast. COMPARISON:  None. FINDINGS: POSTERIOR CIRCULATION: --Vertebral arteries: Normal V4 segments. --Inferior  cerebellar arteries: Normal. --Basilar artery: Normal. --Superior cerebellar arteries: Normal. --Posterior cerebral arteries: Normal. ANTERIOR CIRCULATION: --Intracranial internal carotid arteries: Normal. --Anterior cerebral arteries (ACA): Normal. Both A1 segments are present. Patent anterior communicating artery (a-comm). --Middle cerebral arteries (MCA): Normal. IMPRESSION: Normal intracranial MRA. Electronically Signed   By: Ulyses Jarred M.D.   On: 08/31/2020 20:06    PHYSICAL EXAM Pleasant frail elderly Caucasian lady not in distress . Afebrile. Head is nontraumatic. Neck is supple without bruit.    Cardiac exam no murmur or gallop. Lungs are clear to auscultation. Distal pulses are well felt. Neurological Exam ;  Awake  Alert moderate expressive aphasia and speaks only occasional words and few short sentences.  Able to understand simple midline and one-step commands only.  Eye movements full without nystagmus blinks to threat more on the left than the right..fundi were not visualized.  Moderate right lower facial weakness.  Hearing is normal. Palatal movements are normal. Face asymmetric. Tongue midline. Mild right hemiparesis 4/5 strength with weakness of right grip intrinsic hand muscles.  Fine finger movements are diminished compared to the left.  Mild right hip pain ankle dorsiflexor weakness.  Mild left lower extremity weakness but doubt a foot normal sensation. Gait deferred.  ASSESSMENT/PLAN Angelica Bell is a 86 y.o. female with history of pneumonia,  PE, hypertension, hard of hearing, frequent falls along with question of dementia..  She lives with her grandson who takes care of her.  Per notes on 8/31 her grandson noted that she was having nonsensical speech.  He at that point called 911.  Patient was brought to the hospital for further work-up for encephalopathy.  Part of the work-up included MRI brain.  Which does show that she has a left MCA distribution infarct.  For that  reason neurology was consulted.  Stroke:  left MCA branch infarct embolic  CT head Multiple remote infarcts. No evidence of acute intracranialhemorrhage or infarct.  MRI  Acute infarcts involving the left frontoparietal, insular and occipitotemporal regions.  FLAIR hyperintense signal within the left transverse/sigmoid sinus. While this may reflect slow flow consider MRA/MRV imaging to exclude venous sinus thrombosis. Remote right centrum semiovale, right cerebellar and bilateral basalganglia insults.  MRA  normal  Carotid Doppler bilateral 1-39% carotid stenosis.  2D Echo pending  LDL 85  HgbA1c 6.0  VTE prophylaxis - lovenox    Diet   Diet regular Room service appropriate? Yes; Fluid consistency: Thin     aspirin 81 mg daily and No antithrombotic prior to admission, now on aspirin 81 mg daily and clopidogrel 75 mg daily for 30 days and then ASA alone.   Therapy recommendations:  SLP: CIR, PT: SNF  Disposition:  TBD  Hypertension  Home meds:  amlodipine  Stable . Permissive hypertension (OK if < 220/120) but gradually normalize in 5-7 days . Long-term BP goal normotensive  Hyperlipidemia  Home meds:  none  LDL 85, goal < 70  Add atorvastatin 40 mg daily  Continue statin at discharge  Diabetes type II Controlled  Home meds:  none  HgbA1c 6.0, goal < 7.0  CBGs Recent Labs    08/30/20 0724  GLUCAP 117*       Other Stroke Risk Factors  Advanced age  Other Active Problems  Dementia on aricept and lexapro  Recommend 30 day monitor  Hospital day # Philip, MSN, NP-C [facility] [phone] I have personally obtained history,examined this patient, reviewed notes, independently viewed imaging studies, participated in medical decision making and plan of care.ROS completed by me personally and pertinent positives fully documented  I have made any additions or clarifications directly to the above note. Agree with note  above.  She presented with aphasia and right hemiparesis due to embolic left MCA branch infarct.  Recommend aspirin Plavix for 3 weeks followed by Plavix alone.  Continue ongoing stroke work-up.  Patient is high likely would have having paroxysmal A. fib and will benefit with 30-day heart monitoring.  Aggressive risk factor modification.  Discussed with patient and Dr. Reesa Chew and answered questions.  Greater than 50% time during this 35-minute visit was spent on counseling and coordination of care and discussion with care team  Antony Contras, MD Medical Director [place] Pager: [phone] 09/01/2020 3:29 PM   To contact Stroke Continuity provider, please refer to [email or web address] After hours, contact General Neurology

## 2020-09-01 NOTE — Progress Notes (Signed)
°   09/01/20 1248  Clinical Encounter Type  Visited With Patient and family together  Visit Type Spiritual support;Initial  Referral From Palliative care team  Consult/Referral To Chaplain  Spiritual Encounters  Spiritual Needs Other (Comment) (Presence)  Stress Factors  Patient Stress Factors Major life changes  Family Stress Factors Major life changes  This chaplain responded to PMT consult and Dr. Latina Craver referral for a spiritual care presence.  The chaplain introduced herself to Pt. and the Pt. grandson-David who is bedside.  The Pt. identifies, using the word love, [name] as her grandson. [name] shares he is the Pt. caregiver and HCPOA. The chaplain listens as [name] shares stories of growing up with the Pt. The Pt. with the help of [name] connected the chaplain with the Pt. spirituality.   The chaplain is present as [name] seeks information about next steps and asks the RN to update the Pt. children of this admission. The chaplain is available for F/U spiritual care as needed.

## 2020-09-01 NOTE — Progress Notes (Addendum)
PROGRESS NOTE    Angelica Bell  SWF:[id number] DOB: 1932/02/11 DOA: 08/29/2020 PCP: Heywood Bene, PA-C   Brief Narrative:  86 year old with history of remote PE, chronic pain, HTN presented with altered mental status with concerns of possible overdose.  A day prior to admission she was in her usual mentation but later on she was very confused therefore brought to the hospital.  Apparently she was given 90 pills of oxycodone about a month ago but within less than 10 days she had finished all of them?.  Assessment & Plan:   Principal Problem:   Acute CVA (cerebrovascular accident) ([place]) Active Problems:   Mononeuritis of lower limb   Chronic pain   Essential hypertension   Altered mental status   Cerebrovascular accident (CVA) ([place])   Acute metabolic encephalopathy  Acute metabolic encephalopathy Acute left frontoparietal, insular and occipital temporal region infarct -Unclear etiology -No obvious evidence of infection, UA-negative, lactic acidosis-negative, ammonia, normal, UDS-negative, CT head-negative for acute pathology but shows remote CVA -Tylenol and salicylate levels-negative -TSH-normal, B1- Normal 2, folate-pending -LDL-85, A1c 6.0 -Echocardiogram -Aspirin and Plavix, likely will be followed by Plavix alone. -Lipitor 40 mg daily -Neurology team following MRI brain-was acute left frontotemporal, insular and occipitotemporal infarct, multiple previous right-sided insults -MRA - normal -Speech recommendation-mild aspiration risk, regular, thin liquid.  Chronic pain -Apparently she ran out of her oxycodone tablets within 8 days, she was given about 90 tablets.  But her UDS is negative  Vitamin D deficiency -Started PO supplements.   Externally Rotated Hip, Right -X-ray of the right hip/pelvis-negative for any acute dislocation. Wonder if its from her chronic neuropathy.   Essential hypertension -Norvasc and aspirin  Dementia -Continue Aricept and  Lexapro  Patient's grandson [name] Case is the power of attorney.  Per our scanned documentation it appears to be financial power of attorney, not sure who is the healthcare power of attorney or what it still be him.   DVT prophylaxis: Lovenox Code Status: Full code Family Communication:  Lowella Fairy- he is updated.    Dispo: The patient is from: Home              Anticipated d/c is to: SNF              Anticipated d/c date is: 2 days              Patient currently is not medically stable to d/c.   There is no height or weight on file to calculate BMI.    Subjective: Minimal response, she is awake.  Having trouble expressing herself.  Appears chronically ill.   Examination:  Constitutional: Cachectic frail, appears chronically ill Respiratory: Some bibasilar crackles Cardiovascular: Normal sinus rhythm, no rubs Abdomen: Nontender nondistended good bowel sounds Musculoskeletal: No edema noted Skin: No rashes seen Neurologic: Unable to move her upper extremities especially her left one.  Difficulty following all the commands. Psychiatric: Poor judgment and insight, alert to name Objective: Vitals:   08/31/20 1950 09/01/20 0000 09/01/20 0418 09/01/20 0806  BP: (!) 166/97 (!) 158/93 (!) 147/79 125/78  Pulse: 86 87 91 92  Resp: 17 17 15 18   Temp: 97.9 F (36.6 C) 98.1 F (36.7 C) 98.4 F (36.9 C) 98.1 F (36.7 C)  TempSrc: Oral Oral Oral Oral  SpO2: 99% 97% 97% 96%    Intake/Output Summary (Last 24 hours) at 09/01/2020 0811 Last data filed at 08/31/2020 1058 Gross per 24 hour  Intake --  Output 250 ml  Net -250 ml   There were no vitals filed for this visit.  Data Reviewed:   CBC: Recent Labs  Lab 08/29/20 2300 08/31/20 0256  WBC 9.5 10.3  NEUTROABS 5.6  --   HGB 14.9 13.9  HCT 46.0 42.5  MCV 87.8 86.9  PLT 473* 169*   Basic Metabolic Panel: Recent Labs  Lab 08/29/20 2300 08/31/20 0256  NA 137 138  K 4.0 3.5  CL 102 104  CO2 23 22  GLUCOSE 120*  99  BUN 22 24*  CREATININE 0.81 0.75  CALCIUM 10.1 9.7   GFR: CrCl cannot be calculated (Unknown ideal weight.). Liver Function Tests: Recent Labs  Lab 08/29/20 2300  AST 25  ALT 15  ALKPHOS 103  BILITOT 0.9  PROT 6.7  ALBUMIN 3.8   No results for input(s): LIPASE, AMYLASE in the last 168 hours. Recent Labs  Lab 08/29/20 2300  AMMONIA 22   Coagulation Profile: No results for input(s): INR, PROTIME in the last 168 hours. Cardiac Enzymes: Recent Labs  Lab 08/29/20 2300  CKTOTAL 29*   BNP (last 3 results) No results for input(s): PROBNP in the last 8760 hours. HbA1C: Recent Labs    08/31/20 1242  HGBA1C 6.0*   CBG: Recent Labs  Lab 08/30/20 0724  GLUCAP 117*   Lipid Profile: Recent Labs    08/31/20 1242  CHOL 161  HDL 62  LDLCALC 85  TRIG 71  CHOLHDL 2.6   Thyroid Function Tests: Recent Labs    08/31/20 1242  TSH 0.964   Anemia Panel: Recent Labs    08/31/20 1242  VITAMINB12 342   Sepsis Labs: Recent Labs  Lab 08/29/20 2300  LATICACIDVEN 1.6    Recent Results (from the past 240 hour(s))  SARS Coronavirus 2 by RT PCR (hospital order, performed in [place] hospital lab) Nasopharyngeal Nasopharyngeal Swab     Status: None   Collection Time: 08/29/20 11:00 PM   Specimen: Nasopharyngeal Swab  Result Value Ref Range Status   SARS Coronavirus 2 NEGATIVE NEGATIVE Final    Comment: (NOTE) SARS-CoV-2 target nucleic acids are NOT DETECTED.  The SARS-CoV-2 RNA is generally detectable in upper and lower respiratory specimens during the acute phase of infection. The lowest concentration of SARS-CoV-2 viral copies this assay can detect is 250 copies / mL. A negative result does not preclude SARS-CoV-2 infection and should not be used as the sole basis for treatment or other patient management decisions.  A negative result may occur with improper specimen collection / handling, submission of specimen other than nasopharyngeal swab, presence  of viral mutation(s) within the areas targeted by this assay, and inadequate number of viral copies (<250 copies / mL). A negative result must be combined with clinical observations, patient history, and epidemiological information.  Fact Sheet for Patients:   StrictlyIdeas.no  Fact Sheet for Healthcare Providers: BankingDealers.co.za  This test is not yet approved or  cleared by the [place] FDA and has been authorized for detection and/or diagnosis of SARS-CoV-2 by FDA under an Emergency Use Authorization (EUA).  This EUA will remain in effect (meaning this test can be used) for the duration of the COVID-19 declaration under Section 564(b)(1) of the Act, 21 U.S.C. section 360bbb-3(b)(1), unless the authorization is terminated or revoked sooner.  Performed at [facility], [place] [address], [place], [place] [address]          Radiology Studies: MR ANGIO HEAD WO CONTRAST  Result Date: 08/31/2020 CLINICAL DATA:  Possible stroke EXAM: MRA HEAD WITHOUT CONTRAST TECHNIQUE: Angiographic images of the Circle of Willis were obtained using MRA technique without intravenous contrast. COMPARISON:  None. FINDINGS: POSTERIOR CIRCULATION: --Vertebral arteries: Normal V4 segments. --Inferior cerebellar arteries: Normal. --Basilar artery: Normal. --Superior cerebellar arteries: Normal. --Posterior cerebral arteries: Normal. ANTERIOR CIRCULATION: --Intracranial internal carotid arteries: Normal. --Anterior cerebral arteries (ACA): Normal. Both A1 segments are present. Patent anterior communicating artery (a-comm). --Middle cerebral arteries (MCA): Normal. IMPRESSION: Normal intracranial MRA. Electronically Signed   By: Ulyses Jarred M.D.   On: 08/31/2020 20:06   MR BRAIN WO CONTRAST  Result Date: 08/31/2020 CLINICAL DATA:  Delirium EXAM: MRI HEAD WITHOUT CONTRAST TECHNIQUE: Multiplanar, multiecho pulse sequences of the brain and surrounding  structures were obtained without intravenous contrast. COMPARISON:  08/29/2020 head CT and prior. 07/29/2019 MRA head, 07/27/2019 MRI head and prior. FINDINGS: Brain: Focal restricted diffusion involving the dorsal left frontal, insula, parietal and occipitotemporal regions. No intracranial hemorrhage. No midline shift, ventriculomegaly or extra-axial fluid collection. Advanced chronic microvascular ischemic changes. No mass lesion. Sequela of remote right centrum semiovale, right cerebellar and bilateral basal ganglia insults. Mild diffuse cerebral atrophy with ex vacuo dilatation. Vascular: T2/FLAIR hyperintense signal involving the distal left transverse and sigmoid sinuses (10:4). Major intracranial flow voids are otherwise preserved. Skull and upper cervical spine: Normal marrow signal. Sinuses/Orbits: Normal orbits. Clear paranasal sinuses. No mastoid effusion. Other: None. IMPRESSION: Acute infarcts involving the left frontoparietal, insular and occipitotemporal regions. FLAIR hyperintense signal within the left transverse/sigmoid sinus. While this may reflect slow flow consider MRA/MRV imaging to exclude venous sinus thrombosis. Remote right centrum semiovale, right cerebellar and bilateral basal ganglia insults. Mild cerebral atrophy.  Chronic microvascular ischemic changes. These results were called by telephone at the time of interpretation on 08/31/2020 at 10:12 am to provider [facility] , who verbally acknowledged these results. Electronically Signed   By: Primitivo Gauze M.D.   On: 08/31/2020 10:18   DG Pelvis Portable  Result Date: 08/31/2020 CLINICAL DATA:  Pain.  Question dislocation. 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 Previous gamma nail fixation of the left femur with solid union. No acute or significant focal pelvic finding. IMPRESSION: No acute finding. 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  Electronically Signed   By: Nelson Chimes M.D.   On: 08/31/2020 12:58   DG HIP UNILAT WITH PELVIS 2-3 VIEWS RIGHT  Result Date: 08/31/2020 CLINICAL DATA:  Right hip pain.  Question dislocation. EXAM: DG HIP (WITH OR WITHOUT PELVIS) 2-3V RIGHT COMPARISON:  05/12/2020. FINDINGS: Previous bipolar hip replacement on the right. No complicating feature seen. No dislocation. IMPRESSION: Good appearance of the previous bipolar hip replacement on the right. Electronically Signed   By: Nelson Chimes M.D.   On: 08/31/2020 12:59        Scheduled Meds: . amLODipine  2.5 mg Oral Daily  . aspirin EC  81 mg Oral Daily  . donepezil  5 mg Oral Daily  . enoxaparin (LOVENOX) injection  40 mg Subcutaneous Q24H  . escitalopram  20 mg Oral Daily  . potassium chloride  40 mEq Oral Daily  . sodium chloride flush  3 mL Intravenous Q12H   Continuous Infusions: . lactated ringers 50 mL/hr at 08/30/20 2255     LOS: 1 day   Time spent= 35 mins    Mamta Rimmer Arsenio Loader, MD [facility]  If 7PM-7AM, please contact night-coverage  09/01/2020, 8:11 AM

## 2020-09-01 NOTE — Progress Notes (Signed)
Carotid duplex has been completed.   Preliminary results in CV Proc.   Abram Sander 09/01/2020 2:46 PM

## 2020-09-01 NOTE — Evaluation (Signed)
Speech Language Pathology Evaluation Patient Details Name: Angelica Bell MRN: [id number] DOB: [birth year] Today's Date: 09/01/2020 Time: 0865-7846 SLP Time Calculation (min) (ACUTE ONLY): 36 min  Problem List:  Patient Active Problem List   Diagnosis Date Noted  . Acute CVA (cerebrovascular accident) ([place]) 08/31/2020  . Acute metabolic encephalopathy 96/29/5284  . Acute respiratory failure with hypoxia ([place]) 05/17/2020  . Opioid overdose ([place]) 05/13/2020  . DNR (do not resuscitate) 03/09/2020  . Frequent falls 03/09/2020  . Multiple rib fractures 03/08/2020  . Common bile duct dilatation 03/08/2020  . Leukocytosis 03/08/2020  . Normocytic anemia 03/08/2020  . Hypomagnesemia 02/08/2020  . Fall 02/06/2020  . AKI (acute kidney injury) ([place]) 02/05/2020  . UTI (urinary tract infection) 09/01/2019  . Rhabdomyolysis 09/01/2019  . Shock ([place]) 08/31/2019  . Acute cystitis 08/03/2019  . Confusion and disorientation 08/03/2019  . Cerebrovascular accident (CVA) ([place])   . ARF (acute renal failure) ([place]) 07/26/2019  . Hyponatremia 07/26/2019  . Abnormal liver function 07/26/2019  . Peripheral neuropathy   . Drug overdose   . Polypharmacy   . Altered mental status 10/25/2017  . Closed displaced intertrochanteric fracture of left femur ([place]) 02/16/2017  . Hip fracture, right ([place]) 12/25/2015  . Chronic pain 12/24/2015  . Fracture of femoral neck, right, closed ([place]) 12/24/2015  . Essential hypertension 12/24/2015  . Community acquired pneumonia 04/15/2013  . Hypokalemia 04/15/2013  . Labial cyst 02/08/2012  . POSTMENOPAUSAL SYNDROME 11/28/2009  . Mononeuritis of lower limb 02/22/2009  . INSOMNIA-SLEEP DISORDER-UNSPEC 10/12/2008  . Lumbago 08/02/2008  . 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 ANXIETY DEPRESSION 12/16/2007  . Hyperlipidemia 07/08/2007  . Osteoarthritis 07/08/2007  . Osteoporosis 07/08/2007  . SCOLIOSIS NEC 07/08/2007   Past Medical History:  Past  Medical History:  Diagnosis Date  . Chronic back pain    with degenerative joint disease  . Chronic pain disorder    with narcotic management  . Dry eyes   . Frequent falls   . HOH (hard of hearing)   . Hypertension   . Multiple rib fractures 02/2020  . Osteoporosis   . PE (pulmonary embolism)    After hysterectomy in 1967  . Pneumonia   . Rhabdomyolysis    Past Surgical History:  Past Surgical History:  Procedure Laterality Date  . ABDOMINAL HYSTERECTOMY    . APPENDECTOMY    . BREAST ENHANCEMENT SURGERY    . CATARACT EXTRACTION    . FEMUR IM NAIL Left 02/17/2017   Procedure: INTRAMEDULLARY (IM) NAIL FEMORAL;  Surgeon: Nicholes Stairs, MD;  Location: [place];  Service: Orthopedics;  Laterality: Left;  . HIP ARTHROPLASTY Right 12/24/2015   Procedure: ARTHROPLASTY BIPOLAR HIP (HEMIARTHROPLASTY);  Surgeon: Netta Cedars, MD;  Location: [facility];  Service: Orthopedics;  Laterality: Right;  . TONSILLECTOMY     HPI:  86 year old with history of remote PE, chronic pain, HTN, possible mild dementia, presented with altered mental status with concerns of possible overdose.  A day prior to admission she was in her usual mentation but later on she was very confused therefore brought to the hospital.  Apparently she was given 90 pills of oxycodone about a month ago but within less than 10 days she had finished all of them. MRI shows Acute left frontoparietal, insular and occipital temporal region infarct. Pt passed RN stroke swallow, but has been observed to cough with thin liquids. RN also states that she needs a cognitive linguistic eval which was not ordered initially.    Assessment /  Plan / Recommendation Clinical Impression  Pt demonstrates a primarly expressive aphasia with relatively good comprehension of conversation if contextual cues and some visual reinforcement is given. Pt was anxious and in pain ant time of assesment and was unwilling to participate in following commands or direct  assessment. However, once SLP engaged her in conversation and started comprehending her verbalization of wants and needs pt was very participatory. She was able to comprehend simple phrases and questions with approprite responses though her expressive abilities are moderately impaired. Pts language is fluent with islandes of grammatically appropriate and intelligible phrases with frequent errors with a specific word or mid phrase. Given that about 50% of her speech is intelligible, she does ahve ability to express her needs and ask appropraite questions such as "Why did I have a (unintelligible)" A stroke? "yes, Why?" Did you take your aspirin every day? "yes, well maybe not (unintelligible)" There is also a midl dysarthria complicating pts intelligibility. Pt shows potential for improvement in language function if she is willing to participate in more structured therapy tasks. Will f/u for further interventions. Recommend CIR for f/u.     SLP Assessment  SLP Recommendation/Assessment: Patient needs continued Speech Lanaguage Pathology Services SLP Visit Diagnosis: Aphasia (R47.01)    Follow Up Recommendations  None    Frequency and Duration min 2x/week  2 weeks      SLP Evaluation Cognition  Overall Cognitive Status: Within Functional Limits for tasks assessed Arousal/Alertness: Awake/alert Orientation Level: Oriented to person;Oriented to place;Oriented to situation Attention: Focused;Sustained;Selective Focused Attention: Appears intact Sustained Attention: Appears intact Selective Attention: Appears intact Memory: Appears intact Awareness: Appears intact Problem Solving: Appears intact       Comprehension  Auditory Comprehension Overall Auditory Comprehension: Other (comment) (Pt understood simple commands and conversation) Conversation: Simple Interfering Components: Pain;Anxiety Visual Recognition/Discrimination Discrimination: Not tested    Expression Verbal  Expression Overall Verbal Expression: Impaired Initiation: No impairment Automatic Speech: Name;Social Response Level of Generative/Spontaneous Verbalization: Conversation Repetition: Impaired Level of Impairment: Word level Naming: Not tested Pragmatics: No impairment Interfering Components: Speech intelligibility Effective Techniques: Articulatory cues   Oral / Motor  Oral Motor/Sensory Function Overall Oral Motor/Sensory Function: Mild impairment Facial ROM: Reduced right;Suspected CN VII (facial) dysfunction Facial Symmetry: Abnormal symmetry right;Suspected CN VII (facial) dysfunction Facial Strength: Reduced right;Suspected CN VII (facial) dysfunction Lingual ROM: Within Functional Limits Lingual Symmetry: Within Functional Limits Lingual Strength: Within Functional Limits Motor Speech Overall Motor Speech: Impaired Respiration: Within functional limits Phonation: Normal Resonance: Hyponasality Articulation: Impaired Level of Impairment: Word Intelligibility: Intelligibility reduced Word: 50-74% accurate Phrase: 50-74% accurate Sentence: 50-74% accurate Conversation: 50-74% accurate Motor Planning: Impaired Level of Impairment: Word Motor Speech Errors: Groping for words;Inconsistent   GO                   Herbie Baltimore, MA CCC-SLP  Acute Rehabilitation Services Pager [phone] Office [phone]  Lynann Beaver 09/01/2020, 10:50 AM

## 2020-09-02 DIAGNOSIS — I63412 Cerebral infarction due to embolism of left middle cerebral artery: Secondary | ICD-10-CM

## 2020-09-02 LAB — BASIC METABOLIC PANEL
Anion gap: 7 (ref 5–15)
BUN: 16 mg/dL (ref 8–23)
CO2: 26 mmol/L (ref 22–32)
Calcium: 9.4 mg/dL (ref 8.9–10.3)
Chloride: 104 mmol/L (ref 98–111)
Creatinine, Ser: 0.72 mg/dL (ref 0.44–1.00)
GFR calc Af Amer: >60 mL/min (ref >60)
GFR calc non Af Amer: >60 mL/min (ref >60)
Glucose, Bld: 148 mg/dL — ABNORMAL HIGH (ref 70–99)
Potassium: 3.7 mmol/L (ref 3.5–5.1)
Sodium: 137 mmol/L (ref 135–145)

## 2020-09-02 LAB — CBC
HCT: 37.4 % (ref 36.0–46.0)
Hemoglobin: 12.3 g/dL (ref 12.0–15.0)
MCH: 28.7 pg (ref 26.0–34.0)
MCHC: 32.9 g/dL (ref 30.0–36.0)
MCV: 87.2 fL (ref 80.0–100.0)
Platelets: 350 10*3/uL (ref 150–400)
RBC: 4.29 MIL/uL (ref 3.87–5.11)
RDW: 13.2 % (ref 11.5–15.5)
WBC: 8 10*3/uL (ref 4.0–10.5)
nRBC: 0 % (ref 0.0–0.2)

## 2020-09-02 LAB — MAGNESIUM: Magnesium: 1.5 mg/dL — ABNORMAL LOW (ref 1.7–2.4)

## 2020-09-02 MED ORDER — ENSURE ENLIVE PO LIQD
237.0000 mL | Freq: Two times a day (BID) | ORAL | Status: DC
  Administered 2020-09-02 – 2020-09-05 (×4): 237 mL via ORAL

## 2020-09-02 MED ORDER — ADULT MULTIVITAMIN W/MINERALS CH
1.0000 | ORAL_TABLET | Freq: Every day | ORAL | Status: DC
  Administered 2020-09-03 – 2020-09-05 (×3): 1 via ORAL
  Filled 2020-09-02 (×3): qty 1

## 2020-09-02 MED ORDER — POTASSIUM CHLORIDE 10 MEQ/100ML IV SOLN
10.0000 meq | INTRAVENOUS | Status: AC
  Administered 2020-09-02 (×3): 10 meq via INTRAVENOUS
  Filled 2020-09-02 (×3): qty 100

## 2020-09-02 MED ORDER — MAGNESIUM SULFATE 4 GM/100ML IV SOLN
4.0000 g | Freq: Once | INTRAVENOUS | Status: AC
  Administered 2020-09-02: 4 g via INTRAVENOUS
  Filled 2020-09-02: qty 100

## 2020-09-02 NOTE — Progress Notes (Addendum)
Initial Nutrition Assessment  DOCUMENTATION CODES:   Underweight  INTERVENTION:   Ensure Enlive po BID, each supplement provides 350 kcal and 20 grams of protein  MVI daily   NUTRITION DIAGNOSIS:   Inadequate oral intake related to acute illness as evidenced by meal completion < 50%.  GOAL:   Patient will meet greater than or equal to 90% of their needs  MONITOR:   PO intake, Supplement acceptance, Labs, Weight trends, Skin, I & O's  REASON FOR ASSESSMENT:   Malnutrition Screening Tool    ASSESSMENT:   86 yo female with of Pneumonia, PE, HTN, chronic pain (takes oxycodone) and Pulmonary HTN. Admitted for AMS, initially concern for overdose. (+) R facial droop, dysarthria and R sided weakness. Pt was noted to have a L frontoparietal, insular and occipitotemporal infarct.   RD working remotely.  Unable to speak with pt r/t AMS. Suspect pt with decreased appetite and oral intake at baseline r/t dementia and advanced age. Pt currently eating 25% of meals in hospital and is being offered Ensure supplements. Per chart, pt down 8lbs(7%) over the past 6 months; this is not significant. Pt is at high risk for malnutrition.   Medications reviewed and include: aspirin, D3, plavix, lovenox, LRS @50ml /hr  Labs reviewed: Mg 1.5(L)  NUTRITION - FOCUSED PHYSICAL EXAM: Unable to perform at this time   Diet Order:   Diet Order            Diet regular Room service appropriate? Yes; Fluid consistency: Thin  Diet effective ____                EDUCATION NEEDS:   Not appropriate for education at this time  Skin:  Skin Assessment: Reviewed RN Assessment (ecchymosis)  Last BM:  9/3  Height:   Ht Readings from Last 1 Encounters:  09/02/20 5\' 6"  (1.676 m)    Weight:   Wt Readings from Last 1 Encounters:  07/31/20 48 kg    Estimated Nutritional Needs:   Kcal:  1300-1500kcal/day  Protein:  65-75g/day  Fluid:  >1.3L/day  Koleen Distance MS, RD, LDN Please refer to  [facility] for RD and/or RD on-call/weekend/after hours pager

## 2020-09-02 NOTE — Progress Notes (Signed)
STROKE TEAM PROGRESS NOTE   INTERVAL HISTORY RN is at bedside. Patient lying in bed, able to say short sentences, following limited simple commands. Right LE strength improving but right UE still flaccid. TTE not diagnostic due to lack of good window.   Vitals:   09/02/20 0412 09/02/20 0723 09/02/20 1114 09/02/20 1251  BP: (!) 162/84 (!) 151/77 (!) 159/77   Pulse: 74 78 79   Resp: 18 20 20    Temp: 97.7 F (36.5 C) 97.8 F (36.6 C) 97.7 F (36.5 C)   TempSrc: Oral Oral Oral   SpO2: 97% 98% 99%   Height:    5\' 6"  (1.676 m)   CBC:  Recent Labs  Lab 08/29/20 2300 08/31/20 0256 09/01/20 1439 09/02/20 0256  WBC 9.5   < > 10.2 8.0  NEUTROABS 5.6  --   --   --   HGB 14.9   < > 12.0 12.3  HCT 46.0   < > 37.7 37.4  MCV 87.8   < > 86.9 87.2  PLT 473*   < > 367 350   < > = values in this interval not displayed.   Basic Metabolic Panel:  Recent Labs  Lab 09/01/20 1439 09/02/20 0256  NA 136 137  K 4.0 3.7  CL 104 104  CO2 23 26  GLUCOSE 121* 148*  BUN 19 16  CREATININE 0.77 0.72  CALCIUM 9.5 9.4  MG 1.6* 1.5*   Lipid Panel:  Recent Labs  Lab 08/31/20 1242  CHOL 161  TRIG 71  HDL 62  CHOLHDL 2.6  VLDL 14  LDLCALC 85   HgbA1c:  Recent Labs  Lab 08/31/20 1242  HGBA1C 6.0*   Urine Drug Screen:  Recent Labs  Lab 08/30/20 0204  LABOPIA NONE DETECTED  COCAINSCRNUR NONE DETECTED  LABBENZ NONE DETECTED  AMPHETMU NONE DETECTED  THCU NONE DETECTED  LABBARB NONE DETECTED    Alcohol Level  Recent Labs  Lab 08/29/20 2300  ETH <10    IMAGING past 24 hours  ECHOCARDIOGRAM COMPLETE  Result Date: 09/01/2020    ECHOCARDIOGRAM REPORT   Patient Name:   Angelica Bell Date of Exam: 09/01/2020 Medical Rec #:  [id number]          Height:       66.0 in Accession #:    [id number]         Weight:       105.8 lb Date of Birth:  1932/05/06          BSA:          1.526 m Patient Age:    39 years           BP:           125/68 mmHg Patient Gender: F                  HR:            83 bpm. Exam Location:  Inpatient Procedure: 2D Echo, Cardiac Doppler and Color Doppler Indications:    CVA  History:        Patient has prior history of Echocardiogram examinations, most                 recent 07/28/2019. Stroke, Signs/Symptoms:Altered Mental Status                 and Pneumonia; Risk Factors:Pulmonary emboli, Hypertension and  Former Smoker. Chronic Opiod use.  Sonographer:    Dustin Flock Referring Phys: [id number] [facility] AMIN  Sonographer Comments: No parasternal window, no apical window and Technically difficult study due to poor echo windows. Image acquisition challenging due to respiratory motion and Image acquisition challenging due to breast implants.  Very limited images. The study is not interpretable. Consider TEE if clinically appropriate. Sanda Klein MD Electronically signed by Sanda Klein MD Signature Date/Time: 09/01/2020/4:48:01 PM    Final     PHYSICAL EXAM Pleasant frail elderly Caucasian lady not in distress . Afebrile. Head is nontraumatic. Neck is supple without bruit.    Cardiac exam no murmur or gallop, regular rhythm. Lungs are clear to auscultation. Distal pulses are well felt.  Neurological Exam Awake, alert, eyes open, able to say "I do not know", able to count fingers, but still has difficulty with spontaneous speech, difficulty with naming or repetition. Able to follow most midline and peripheral commands, but not able to wiggle toes as requested. Perseveration and paraphasic errors. Visual field full, no gaze deviation, right nasolabial fold flattening, tongue midline. LUE at least 4/5, RUE flaccid, LLE 4/5, RLE 3/5 proximal and 0/5 distally. Sensation, coordination not cooperative and gait not tested.   ASSESSMENT/PLAN Ms. Angelica Bell is a 86 y.o. female with history of pneumonia, PE, hypertension, hard of hearing, frequent falls along with question of dementia. She lives with her grandson who takes care of her.  Per  notes on 8/31 her grandson noted that she was having nonsensical speech.  He at that point called 911.  Patient was brought to the hospital for further work-up for encephalopathy.  Part of the work-up included MRI brain.  Which does show that she has a left MCA distribution infarct.  For that reason neurology was consulted.  Stroke:  left MCA infarct, embolic pattern, source unclear.  CT head - Multiple remote infarcts. No evidence of acute intracranialhemorrhage or infarct.  MRI - Acute infarcts involving the left frontoparietal, insular and occipitotemporal regions. Remote right centrum semiovale, right cerebellar and bilateral basalganglia insults.  MRA -  normal  Carotid Doppler -  bilateral 1-39% carotid stenosis.    2D Echo pending  LE venous Doppler pending  Agree with Dr. Leonie Man, recommend 30-day Cardiac event monitor as outpatient to rule out A. fib  LDL - 85  HgbA1c - 6.0  VTE prophylaxis - lovenox  aspirin 81 mg daily and No antithrombotic prior to admission, now on aspirin 81 mg daily and clopidogrel 75 mg daily for 30 days and then ASA alone.   Therapy recommendations:  SNF  Disposition:  TBD  Hypertension  Home meds:  amlodipine  Stable . Long-term BP goal normotensive  Hyperlipidemia  Home meds:  none  LDL 85, goal < 70  Add atorvastatin 40 mg daily  Continue statin at discharge  Other Stroke Risk Factors  Advanced age  ? Hx of PE  Other Active Problems  Dementia on aricept and lexapro  Frequent falls  Hard of hearing  Hospital day # 2  Rosalin Hawking, MD PhD Stroke Neurology 09/02/2020 8:59 PM    To contact Stroke Continuity provider, please refer to [email or web address] After hours, contact General Neurology

## 2020-09-02 NOTE — Progress Notes (Signed)
   Palliative Medicine Inpatient Follow Up Note  Reason for consult:  Goals of Care "Goals of care, difficult family dynamic. Large acute cva"  HPI:  Per intake H&P --> Pt is a 86 yo female with of Pneumonia, PE, HTN, chronic pain (takes oxycodone) and Pulmonary HTN. Admitted for AMS, initially concern for overdose. (+) R facial droop, dysarthria and R sided weakness. Pt was noted to have a L frontoparietal, insular and occipitotemporal infarct.  Palliative care was asked to aid in GOC conversations in the setting of a significant stroke.   Today's Discussion (09/02/2020): Chart reviewed. I met with Slyvia this morning, she was requesting breakfast at that time. She states that she does not remember meeting me yesterday. I reiterated the highlights of our conversation to her. She then has some recollection. She is not at a point where she wants to continue this conversation presently. Offered that I will be here if she decides otherwise and my goal is to continue to offer support for her.   Discussed the importance of continued conversation with family and their  medical providers regarding overall plan of care and treatment options, ensuring decisions are within the context of the patients values and GOCs.  Questions and concerns addressed   SUMMARY OF RECOMMENDATIONS   Full Code - Patient not ready to change code status  TOC --> OP palliative to continue goals conversations - reached out to liaison this morning to verify arrangement  Spiritual Support offerred  Plan for transition to [place] once medically stable  Time Spent: 25 Greater than 50% of the time was spent in counseling and coordination of care ______________________________________________________________________________________   [place] Palliative Medicine Team Team Cell Phone: [phone] Please utilize secure chat with additional questions, if there is no response within 30 minutes  please call the above phone number  Palliative Medicine Team providers are available by phone from 7am to 7pm daily and can be reached through the team cell phone.  Should this patient require assistance outside of these hours, please call the patient's attending physician.     

## 2020-09-02 NOTE — TOC Transition Note (Signed)
Transition of Care [facility]) - CM/SW Discharge Note   Patient Details  Name: Marijke Guadiana MRN: [id number] Date of Birth: Jun 04, 1932  [facility] of [facility] CM/SW Contact:  Varney Baas Phone Number: 09/02/2020, 12:34 PM   Clinical Narrative:    CSW reached out to [facility] to follow-up on referral. CSW received no answer and unable to leave a voicemail.     Barriers to Discharge: Ship broker, Continued Medical Work up   Patient Goals and CMS Choice Patient states their goals for this hospitalization and ongoing recovery are:: patient unable to participate in goal setting due to disorientation CMS Medicare.gov Compare Post Acute Care list provided to:: Patient Represenative (must comment) Choice offered to / list presented to : [place] / [place]  Discharge Placement                       Discharge Plan and Services     Post Acute Care Choice: Skilled Nursing Facility                               Social Determinants of Health (SDOH) Interventions     Readmission Risk Interventions Readmission Risk Prevention Plan 05/15/2020  Transportation Screening Complete  PCP or Specialist Appt within 3-5 Days Complete  HRI or [place] Complete  Social Work Consult for [place] Planning/Counseling Complete  Palliative Care Screening Complete  Medication Review Press photographer) Complete  Some recent data might be hidden

## 2020-09-02 NOTE — Progress Notes (Addendum)
PROGRESS NOTE    Angelica Bell  URK:[id number] DOB: 1932-02-18 DOA: 08/29/2020 PCP: Heywood Bene, PA-C   Brief Narrative:  86 year old with history of remote PE, chronic pain, HTN presented with altered mental status with concerns of possible overdose.  A day prior to admission she was in her usual mentation but later on she was very confused therefore brought to the hospital.  Apparently she was given 90 pills of oxycodone about a month ago but within less than 10 days she had finished all of them?Marland Kitchen  MRI showed acute CVA therefore recommended aspirin Plavix for 3 weeks followed by Plavix alone.  Also recommended 30-day heart monitoring due to risk of A. fib.  Seen by palliative care services, family member would like patient to get outpatient follow-up with palliative.  PT recommended SNF.  Assessment & Plan:   Principal Problem:   Acute CVA (cerebrovascular accident) ([place]) Active Problems:   Mononeuritis of lower limb   Chronic pain   Essential hypertension   Altered mental status   Cerebrovascular accident (CVA) ([place])   Acute metabolic encephalopathy   Palliative care by specialist   Goals of care, counseling/discussion   DNR (do not resuscitate) discussion  Acute metabolic encephalopathy, awake and improved.  Pleasantly confused. Acute left frontoparietal, insular and occipital temporal region infarct -Unclear etiology -No obvious evidence of infection, UA-negative, lactic acidosis-negative, ammonia, normal, UDS-negative, CT head-negative for acute pathology but shows remote CVA -Tylenol and salicylate levels-negative -TSH-normal, B1- Normal 2, folate-normal -LDL-85, A1c 6.0 -Echocardiogram-poor study. -Aspirin and Plavix, likely will be followed by Plavix alone. -Lipitor 40 mg daily -Neurology team following MRI brain-was acute left frontotemporal, insular and occipitotemporal infarct, multiple previous right-sided insults -MRA - normal -Speech  recommendation-mild aspiration risk, regular, thin liquid.  Chronic pain -Apparently she ran out of her oxycodone tablets within 8 days, she was given about 90 tablets.  But her UDS is negative  Vitamin D deficiency -Continue p.o. supplements  Externally Rotated Hip, Right-likely chronic. -X-ray of the right hip/pelvis-negative for any acute dislocation. Wonder if its from her chronic neuropathy.   Essential hypertension -Norvasc and aspirin  Dementia -Continue Aricept and Lexapro  Patient's grandson [name] Case is the power of attorney.  Per our scanned documentation it appears to be financial power of attorney, not sure who is the healthcare power of attorney or what it still be him.   DVT prophylaxis: Lovenox Code Status: Full code Family Communication: [name] updated.    Dispo: The patient is from: Home              Anticipated d/c is to: SNF              Anticipated d/c date is: 2 days              Patient currently is not medically stable to d/c.   There is no height or weight on file to calculate BMI.    Subjective: Patient is sitting up in her bed follows all the basic commands but is pleasantly confused.  She is able to move her right lower extremity.  She is slightly able to lift her right upper extremity as well.   Examination:  Constitutional: Chronically ill and frail, bilateral temporal wasting.  Cachectic frail. Respiratory: Clear to auscultation bilaterally Cardiovascular: Normal sinus rhythm, no rubs Abdomen: Nontender nondistended good bowel sounds Musculoskeletal: No edema noted Skin: No rashes seen Neurologic: Follows basic commands.  Able to move bilateral lower extremities.  Left lower extremity  strength 4/5.  Right upper extremity strength 2/5. Psychiatric: Alert to name.  Poor judgment and insight.  Objective: Vitals:   09/01/20 1944 09/01/20 2326 09/02/20 0412 09/02/20 0723  BP: (!) 152/78 (!) 144/80 (!) 162/84 (!) 151/77  Pulse: 81 77 74 78   Resp: 16 16 18 20   Temp: 97.6 F (36.4 C) 97.7 F (36.5 C) 97.7 F (36.5 C) 97.8 F (36.6 C)  TempSrc: Oral Oral Oral Oral  SpO2: 97% 97% 97% 98%    Intake/Output Summary (Last 24 hours) at 09/02/2020 0930 Last data filed at 09/02/2020 5053 Gross per 24 hour  Intake 3136.55 ml  Output --  Net 3136.55 ml   There were no vitals filed for this visit.  Data Reviewed:   CBC: Recent Labs  Lab 08/29/20 2300 08/31/20 0256 08/31/20 1242 09/01/20 1439 09/02/20 0256  WBC 9.5 10.3  --  10.2 8.0  NEUTROABS 5.6  --   --   --   --   HGB 14.9 13.9  --  12.0 12.3  HCT 46.0 42.5 43.7 37.7 37.4  MCV 87.8 86.9  --  86.9 87.2  PLT 473* 418*  --  367 976   Basic Metabolic Panel: Recent Labs  Lab 08/29/20 2300 08/31/20 0256 09/01/20 1439 09/02/20 0256  NA 137 138 136 137  K 4.0 3.5 4.0 3.7  CL 102 104 104 104  CO2 23 22 23 26   GLUCOSE 120* 99 121* 148*  BUN 22 24* 19 16  CREATININE 0.81 0.75 0.77 0.72  CALCIUM 10.1 9.7 9.5 9.4  MG  --   --  1.6* 1.5*   GFR: CrCl cannot be calculated (Unknown ideal weight.). Liver Function Tests: Recent Labs  Lab 08/29/20 2300  AST 25  ALT 15  ALKPHOS 103  BILITOT 0.9  PROT 6.7  ALBUMIN 3.8   No results for input(s): LIPASE, AMYLASE in the last 168 hours. Recent Labs  Lab 08/29/20 2300  AMMONIA 22   Coagulation Profile: No results for input(s): INR, PROTIME in the last 168 hours. Cardiac Enzymes: Recent Labs  Lab 08/29/20 2300  CKTOTAL 29*   BNP (last 3 results) No results for input(s): PROBNP in the last 8760 hours. HbA1C: Recent Labs    08/31/20 1242  HGBA1C 6.0*   CBG: Recent Labs  Lab 08/30/20 0724  GLUCAP 117*   Lipid Profile: Recent Labs    08/31/20 1242  CHOL 161  HDL 62  LDLCALC 85  TRIG 71  CHOLHDL 2.6   Thyroid Function Tests: Recent Labs    08/31/20 1242  TSH 0.964   Anemia Panel: Recent Labs    08/31/20 1242  VITAMINB12 342   Sepsis Labs: Recent Labs  Lab 08/29/20 2300   LATICACIDVEN 1.6    Recent Results (from the past 240 hour(s))  SARS Coronavirus 2 by RT PCR (hospital order, performed in [place] hospital lab) Nasopharyngeal Nasopharyngeal Swab     Status: None   Collection Time: 08/29/20 11:00 PM   Specimen: Nasopharyngeal Swab  Result Value Ref Range Status   SARS Coronavirus 2 NEGATIVE NEGATIVE Final    Comment: (NOTE) SARS-CoV-2 target nucleic acids are NOT DETECTED.  The SARS-CoV-2 RNA is generally detectable in upper and lower respiratory specimens during the acute phase of infection. The lowest concentration of SARS-CoV-2 viral copies this assay can detect is 250 copies / mL. A negative result does not preclude SARS-CoV-2 infection and should not be used as the sole basis for treatment or other patient  management decisions.  A negative result may occur with improper specimen collection / handling, submission of specimen other than nasopharyngeal swab, presence of viral mutation(s) within the areas targeted by this assay, and inadequate number of viral copies (<250 copies / mL). A negative result must be combined with clinical observations, patient history, and epidemiological information.  Fact Sheet for Patients:   StrictlyIdeas.no  Fact Sheet for Healthcare Providers: BankingDealers.co.za  This test is not yet approved or  cleared by the [place] FDA and has been authorized for detection and/or diagnosis of SARS-CoV-2 by FDA under an Emergency Use Authorization (EUA).  This EUA will remain in effect (meaning this test can be used) for the duration of the COVID-19 declaration under Section 564(b)(1) of the Act, 21 U.S.C. section 360bbb-3(b)(1), unless the authorization is terminated or revoked sooner.  Performed at [facility], [place] [address], [place], [place] [address]          Radiology Studies: MR ANGIO HEAD WO CONTRAST  Result Date: 08/31/2020 CLINICAL  DATA:  Possible stroke EXAM: MRA HEAD WITHOUT CONTRAST TECHNIQUE: Angiographic images of the Circle of Willis were obtained using MRA technique without intravenous contrast. COMPARISON:  None. FINDINGS: POSTERIOR CIRCULATION: --Vertebral arteries: Normal V4 segments. --Inferior cerebellar arteries: Normal. --Basilar artery: Normal. --Superior cerebellar arteries: Normal. --Posterior cerebral arteries: Normal. ANTERIOR CIRCULATION: --Intracranial internal carotid arteries: Normal. --Anterior cerebral arteries (ACA): Normal. Both A1 segments are present. Patent anterior communicating artery (a-comm). --Middle cerebral arteries (MCA): Normal. IMPRESSION: Normal intracranial MRA. Electronically Signed   By: Ulyses Jarred M.D.   On: 08/31/2020 20:06   DG Pelvis Portable  Result Date: 08/31/2020 CLINICAL DATA:  Pain.  Question dislocation. 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 Previous gamma nail fixation of the left femur with solid union. No acute or significant focal pelvic finding. IMPRESSION: No acute finding. 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 Electronically Signed   By: Nelson Chimes M.D.   On: 08/31/2020 12:58   ECHOCARDIOGRAM COMPLETE  Result Date: 09/01/2020    ECHOCARDIOGRAM REPORT   Patient Name:   CAMERA KRIENKE [facility] Date of Exam: 09/01/2020 Medical Rec #:  [id number]          Height:       66.0 in Accession #:    [id number]         Weight:       105.8 lb Date of Birth:  March 25, 1932          BSA:          1.526 m Patient Age:    31 years           BP:           125/68 mmHg Patient Gender: F                  HR:           83 bpm. Exam Location:  Inpatient Procedure: 2D Echo, Cardiac Doppler and Color Doppler Indications:    CVA  History:        Patient has prior history of Echocardiogram examinations, most                 recent 07/28/2019. Stroke, Signs/Symptoms:Altered Mental Status                 and Pneumonia;  Risk Factors:Pulmonary emboli, Hypertension and  Former Smoker. Chronic Opiod use.  Sonographer:    Dustin Flock Referring Phys: [id number] [facility] Lamoyne Palencia  Sonographer Comments: No parasternal window, no apical window and Technically difficult study due to poor echo windows. Image acquisition challenging due to respiratory motion and Image acquisition challenging due to breast implants.  Very limited images. The study is not interpretable. Consider TEE if clinically appropriate. Sanda Klein MD Electronically signed by Sanda Klein MD Signature Date/Time: 09/01/2020/4:48:01 PM    Final    DG HIP UNILAT WITH PELVIS 2-3 VIEWS RIGHT  Result Date: 08/31/2020 CLINICAL DATA:  Right hip pain.  Question dislocation. EXAM: DG HIP (WITH OR WITHOUT PELVIS) 2-3V RIGHT COMPARISON:  05/12/2020. FINDINGS: Previous bipolar hip replacement on the right. No complicating feature seen. No dislocation. IMPRESSION: Good appearance of the previous bipolar hip replacement on the right. Electronically Signed   By: Nelson Chimes M.D.   On: 08/31/2020 12:59   VAS US CAROTID  Result Date: 09/01/2020 Carotid Arterial Duplex Study Indications:       CVA. Risk Factors:      Hypertension. Comparison Study:  07/27/19 previous Performing Technologist: Abram Sander RVS  Examination Guidelines: A complete evaluation includes B-mode imaging, spectral Doppler, color Doppler, and power Doppler as needed of all accessible portions of each vessel. Bilateral testing is considered an integral part of a complete examination. Limited examinations for reoccurring indications may be performed as noted.  Right Carotid Findings: +----------+--------+--------+--------+------------------+--------+           PSV cm/sEDV cm/sStenosisPlaque DescriptionComments +----------+--------+--------+--------+------------------+--------+ CCA Prox  91      14              heterogenous                +----------+--------+--------+--------+------------------+--------+ CCA Distal65      12              heterogenous               +----------+--------+--------+--------+------------------+--------+ ICA Prox  85      16      1-39%   heterogenous               +----------+--------+--------+--------+------------------+--------+ ICA Distal67      13                                         +----------+--------+--------+--------+------------------+--------+ ECA       95                                                 +----------+--------+--------+--------+------------------+--------+ +----------+--------+-------+--------+-------------------+           PSV cm/sEDV cmsDescribeArm Pressure (mmHG) +----------+--------+-------+--------+-------------------+ [id number]                                        +----------+--------+-------+--------+-------------------+ +---------+--------+--+--------+--+---------+ VertebralPSV cm/s39EDV cm/s11Antegrade +---------+--------+--+--------+--+---------+  Left Carotid Findings: +----------+--------+--------+--------+------------------+--------+           PSV cm/sEDV cm/sStenosisPlaque DescriptionComments +----------+--------+--------+--------+------------------+--------+ CCA Prox  88      17              heterogenous               +----------+--------+--------+--------+------------------+--------+  CCA Distal71      14              heterogenous               +----------+--------+--------+--------+------------------+--------+ ICA Prox  79      18      1-39%   heterogenous               +----------+--------+--------+--------+------------------+--------+ ICA Distal57      22                                         +----------+--------+--------+--------+------------------+--------+ ECA       82                                                  +----------+--------+--------+--------+------------------+--------+ +----------+--------+--------+--------+-------------------+           PSV cm/sEDV cm/sDescribeArm Pressure (mmHG) +----------+--------+--------+--------+-------------------+ [id number]                                         +----------+--------+--------+--------+-------------------+ +---------+--------+--+--------+--+---------+ VertebralPSV cm/s74EDV cm/s15Antegrade +---------+--------+--+--------+--+---------+   Summary: Right Carotid: Velocities in the right ICA are consistent with a 1-39% stenosis. Left Carotid: Velocities in the left ICA are consistent with a 1-39% stenosis. Vertebrals: Bilateral vertebral arteries demonstrate antegrade flow. *See table(s) above for measurements and observations.     Preliminary         Scheduled Meds: . amLODipine  2.5 mg Oral Daily  . aspirin EC  81 mg Oral Daily  . atorvastatin  40 mg Oral Daily  . cholecalciferol  1,000 Units Oral Daily  . clopidogrel  75 mg Oral Daily  . donepezil  5 mg Oral Daily  . enoxaparin (LOVENOX) injection  40 mg Subcutaneous Q24H  . escitalopram  20 mg Oral Daily  . sodium chloride flush  3 mL Intravenous Q12H   Continuous Infusions: . lactated ringers 50 mL/hr at 09/02/20 0439  . magnesium sulfate bolus IVPB    . potassium chloride 10 mEq (09/02/20 0924)     LOS: 2 days   Time spent= 35 mins    Kairav Russomanno Arsenio Loader, MD [facility]  If 7PM-7AM, please contact night-coverage  09/02/2020, 9:30 AM

## 2020-09-03 ENCOUNTER — Inpatient Hospital Stay (HOSPITAL_COMMUNITY): Payer: Medicare Other

## 2020-09-03 DIAGNOSIS — I639 Cerebral infarction, unspecified: Secondary | ICD-10-CM

## 2020-09-03 DIAGNOSIS — I6389 Other cerebral infarction: Secondary | ICD-10-CM

## 2020-09-03 LAB — BASIC METABOLIC PANEL
Anion gap: 7 (ref 5–15)
BUN: 15 mg/dL (ref 8–23)
CO2: 24 mmol/L (ref 22–32)
Calcium: 9 mg/dL (ref 8.9–10.3)
Chloride: 106 mmol/L (ref 98–111)
Creatinine, Ser: 0.63 mg/dL (ref 0.44–1.00)
GFR calc Af Amer: >60 mL/min (ref >60)
GFR calc non Af Amer: >60 mL/min (ref >60)
Glucose, Bld: 109 mg/dL — ABNORMAL HIGH (ref 70–99)
Potassium: 4.1 mmol/L (ref 3.5–5.1)
Sodium: 137 mmol/L (ref 135–145)

## 2020-09-03 LAB — CBC
HCT: 35 % — ABNORMAL LOW (ref 36.0–46.0)
Hemoglobin: 11.1 g/dL — ABNORMAL LOW (ref 12.0–15.0)
MCH: 27.5 pg (ref 26.0–34.0)
MCHC: 31.7 g/dL (ref 30.0–36.0)
MCV: 86.8 fL (ref 80.0–100.0)
Platelets: 281 10*3/uL (ref 150–400)
RBC: 4.03 MIL/uL (ref 3.87–5.11)
RDW: 13.4 % (ref 11.5–15.5)
WBC: 7.5 10*3/uL (ref 4.0–10.5)
nRBC: 0 % (ref 0.0–0.2)

## 2020-09-03 LAB — MAGNESIUM: Magnesium: 2 mg/dL (ref 1.7–2.4)

## 2020-09-03 LAB — ECHOCARDIOGRAM COMPLETE
Area-P 1/2: 2.65 cm2
Height: 66 in

## 2020-09-03 MED ORDER — PERFLUTREN LIPID MICROSPHERE
1.0000 mL | INTRAVENOUS | Status: AC | PRN
  Administered 2020-09-03: 2 mL via INTRAVENOUS
  Filled 2020-09-03: qty 10

## 2020-09-03 NOTE — Progress Notes (Signed)
   Palliative Medicine Inpatient Follow Up Note  Reason for consult:  Goals of Care "Goals of care, difficult family dynamic. Large acute cva"  HPI:  Per intake H&P --> Pt is a 86 yo female with of Pneumonia, PE, HTN, chronic pain (takes oxycodone) and Pulmonary HTN. Admitted for AMS, initially concern for overdose. (+) R facial droop, dysarthria and R sided weakness. Pt was noted to have a L frontoparietal, insular and occipitotemporal infarct.  Palliative care was asked to aid in [place] conversations in the setting of a significant stroke.   Today's Discussion (09/03/2020): Chart reviewed. I met with Angelica Bell this morning. Her grandson had filled out her MOST form with her and left it at bedside. We reviewed this together as below:  Cardiopulmonary Resuscitation: Attempt Resuscitation (CPR)  Medical Interventions: Limited Additional Interventions: Use medical treatment, IV fluids and cardiac monitoring as indicated, DO NOT USE intubation or mechanical ventilation. May consider use of less invasive airway support such as BiPAP or CPAP. Also provide comfort measures. Transfer to the hospital if indicated. Avoid intensive care.   Antibiotics: Antibiotics if indicated  IV Fluids: IV fluids if indicated  Feeding Tube: No feeding tube   We discussed the plan for transition to skilled nursing facility as early as tomorrow.   Discussed the importance of continued conversation with family and their  medical providers regarding overall plan of care and treatment options, ensuring decisions are within the context of the patients values and GOCs.  Questions and concerns addressed   SUMMARY OF RECOMMENDATIONS   Limited Code - Attempt CPR / No intubation / IV Abx / IVF / No tube feeding  MOST completed - To be scanned into [facility]  TOC --> OP palliative to continue goals conversations   Spiritual Support offerred  Plan for transition to [facility] once medically stable  Time Spent: 25 Greater  than 50% of the time was spent in counseling and coordination of care ______________________________________________________________________________________ [place] Team Team Cell Phone: [phone] Please utilize secure chat with additional questions, if there is no response within 30 minutes please call the above phone number  Palliative Medicine Team providers are available by phone from 7am to 7pm daily and can be reached through the team cell phone.  Should this patient require assistance outside of these hours, please call the patient's attending physician.

## 2020-09-03 NOTE — Progress Notes (Signed)
PROGRESS NOTE    Angelica Bell  BPZ:[id number] DOB: Nov 01, 1932 DOA: 08/29/2020 PCP: Heywood Bene, PA-C   Brief Narrative:  86 year old with history of remote PE, chronic pain, HTN presented with altered mental status with concerns of possible overdose.  A day prior to admission she was in her usual mentation but later on she was very confused therefore brought to the hospital.  Apparently she was given 90 pills of oxycodone about a month ago but within less than 10 days she had finished all of them?Marland Kitchen  MRI showed acute CVA therefore recommended aspirin Plavix for 3 weeks followed by Plavix alone.  Also recommended 30-day heart monitoring due to risk of A. fib.  Seen by palliative care services, family member would like patient to get outpatient follow-up with palliative.  PT recommended SNF.  Assessment & Plan:   Principal Problem:   Acute CVA (cerebrovascular accident) ([place]) Active Problems:   Mononeuritis of lower limb   Chronic pain   Essential hypertension   Altered mental status   Cerebrovascular accident (CVA) ([place])   Acute metabolic encephalopathy   Palliative care by specialist   Goals of care, counseling/discussion   DNR (do not resuscitate) discussion  Acute metabolic encephalopathy, awake and improved.  Pleasantly confused. Acute left frontoparietal, insular and occipital temporal region infarct -Unclear etiology -No obvious evidence of infection, UA-negative, lactic acidosis-negative, ammonia, normal, UDS-negative, CT head-negative for acute pathology but shows remote CVA -Tylenol and salicylate levels-negative -TSH-normal, B1- Normal 2, folate-normal -LDL-85, A1c 6.0 -Echocardiogram-poor study. Therefore repeat study ordered -Lower extremity Dopplers ordered-pending -Aspirin and Plavix, likely will be followed by ASA alone per neurology.  -30-day cardiac event monitor -Lipitor 40 mg daily -Neurology team following MRI brain-was acute left frontotemporal,  insular and occipitotemporal infarct, multiple previous right-sided insults -MRA - normal -Speech recommendation-mild aspiration risk, regular, thin liquid.  Chronic pain -Apparently she ran out of her oxycodone tablets within 8 days, she was given about 90 tablets.  But her UDS is negative  Vitamin D deficiency -Continue p.o. supplements  Externally Rotated Hip, Right-likely chronic. -X-ray of the right hip/pelvis-negative for any acute dislocation. Wonder if its from her chronic neuropathy.   Essential hypertension -Norvasc and aspirin  Dementia -Continue Aricept and Lexapro  Severe protein calorie malnutrition -Encourage oral intake. Ensure in between meals.  Patient's grandson [name] Case is the power of attorney.    DVT prophylaxis: Lovenox Code Status: Partial code-okay CPR. No intubation. Family Communication:    Dispo: The patient is from: Home              Anticipated d/c is to: SNF              Anticipated d/c date is: 2 days              Patient currently is not medically stable to d/c. Pending neurology clearance thereafter will go to SNF.   Body mass index is 17.08 kg/m.    Subjective: Seen and examined at bedside, follows very basic commands but no meaningful conversation due to confusion. Attempting to eat her banana.   Examination:  Constitutional: Chronically ill and frail appearing with bilateral temporal wasting Respiratory: Clear to auscultation bilaterally Cardiovascular: Normal sinus rhythm, no rubs Abdomen: Nontender nondistended good bowel sounds Musculoskeletal: No edema noted Skin: No rashes seen Neurologic: Unable to move her right extremity much today, left upper and bilateral lower extremity strength 4/5. Psychiatric: Alert to name only. Poor judgment and insight Objective: Vitals:  09/02/20 2006 09/02/20 2306 09/03/20 0435 09/03/20 0812  BP: 126/69 (!) 143/67 (!) 165/82 (!) 152/69  Pulse: 72 67 79 76  Resp: 16 18 18 16   Temp:  97.8 F (36.6 C) 97.7 F (36.5 C) 97.6 F (36.4 C) (!) 97.5 F (36.4 C)  TempSrc: Oral Oral Oral Oral  SpO2: 98% 98% 100% 99%  Height:        Intake/Output Summary (Last 24 hours) at 09/03/2020 0944 Last data filed at 09/03/2020 0800 Gross per 24 hour  Intake 1637.36 ml  Output 301 ml  Net 1336.36 ml   There were no vitals filed for this visit.  Data Reviewed:   CBC: Recent Labs  Lab 08/29/20 2300 08/29/20 2300 08/31/20 0256 08/31/20 1242 09/01/20 1439 09/02/20 0256 09/03/20 0400  WBC 9.5  --  10.3  --  10.2 8.0 7.5  NEUTROABS 5.6  --   --   --   --   --   --   HGB 14.9  --  13.9  --  12.0 12.3 11.1*  HCT 46.0   < > 42.5 43.7 37.7 37.4 35.0*  MCV 87.8  --  86.9  --  86.9 87.2 86.8  PLT 473*  --  418*  --  367 350 281   < > = values in this interval not displayed.   Basic Metabolic Panel: Recent Labs  Lab 08/29/20 2300 08/31/20 0256 09/01/20 1439 09/02/20 0256 09/03/20 0400  NA 137 138 136 137 137  K 4.0 3.5 4.0 3.7 4.1  CL 102 104 104 104 106  CO2 23 22 23 26 24   GLUCOSE 120* 99 121* 148* 109*  BUN 22 24* 19 16 15   CREATININE 0.81 0.75 0.77 0.72 0.63  CALCIUM 10.1 9.7 9.5 9.4 9.0  MG  --   --  1.6* 1.5* 2.0   GFR: CrCl cannot be calculated (Unknown ideal weight.). Liver Function Tests: Recent Labs  Lab 08/29/20 2300  AST 25  ALT 15  ALKPHOS 103  BILITOT 0.9  PROT 6.7  ALBUMIN 3.8   No results for input(s): LIPASE, AMYLASE in the last 168 hours. Recent Labs  Lab 08/29/20 2300  AMMONIA 22   Coagulation Profile: No results for input(s): INR, PROTIME in the last 168 hours. Cardiac Enzymes: Recent Labs  Lab 08/29/20 2300  CKTOTAL 29*   BNP (last 3 results) No results for input(s): PROBNP in the last 8760 hours. HbA1C: Recent Labs    08/31/20 1242  HGBA1C 6.0*   CBG: Recent Labs  Lab 08/30/20 0724  GLUCAP 117*   Lipid Profile: Recent Labs    08/31/20 1242  CHOL 161  HDL 62  LDLCALC 85  TRIG 71  CHOLHDL 2.6   Thyroid  Function Tests: Recent Labs    08/31/20 1242  TSH 0.964   Anemia Panel: Recent Labs    08/31/20 1242  VITAMINB12 342   Sepsis Labs: Recent Labs  Lab 08/29/20 2300  LATICACIDVEN 1.6    Recent Results (from the past 240 hour(s))  SARS Coronavirus 2 by RT PCR (hospital order, performed in [place] hospital lab) Nasopharyngeal Nasopharyngeal Swab     Status: None   Collection Time: 08/29/20 11:00 PM   Specimen: Nasopharyngeal Swab  Result Value Ref Range Status   SARS Coronavirus 2 NEGATIVE NEGATIVE Final    Comment: (NOTE) SARS-CoV-2 target nucleic acids are NOT DETECTED.  The SARS-CoV-2 RNA is generally detectable in upper and lower respiratory specimens during the acute phase of infection.  The lowest concentration of SARS-CoV-2 viral copies this assay can detect is 250 copies / mL. A negative result does not preclude SARS-CoV-2 infection and should not be used as the sole basis for treatment or other patient management decisions.  A negative result may occur with improper specimen collection / handling, submission of specimen other than nasopharyngeal swab, presence of viral mutation(s) within the areas targeted by this assay, and inadequate number of viral copies (<250 copies / mL). A negative result must be combined with clinical observations, patient history, and epidemiological information.  Fact Sheet for Patients:   StrictlyIdeas.no  Fact Sheet for Healthcare Providers: BankingDealers.co.za  This test is not yet approved or  cleared by the [place] FDA and has been authorized for detection and/or diagnosis of SARS-CoV-2 by FDA under an Emergency Use Authorization (EUA).  This EUA will remain in effect (meaning this test can be used) for the duration of the COVID-19 declaration under Section 564(b)(1) of the Act, 21 U.S.C. section 360bbb-3(b)(1), unless the authorization is terminated or revoked  sooner.  Performed at [facility], [place] [address], [place], [place] [address]          Radiology Studies: ECHOCARDIOGRAM COMPLETE  Result Date: 09/01/2020    ECHOCARDIOGRAM REPORT   Patient Name:   TANIKA BRACCO [facility] Date of Exam: 09/01/2020 Medical Rec #:  [id number]          Height:       66.0 in Accession #:    [id number]         Weight:       105.8 lb Date of Birth:  Jan 14, 1932          BSA:          1.526 m Patient Age:    54 years           BP:           125/68 mmHg Patient Gender: F                  HR:           83 bpm. Exam Location:  Inpatient Procedure: 2D Echo, Cardiac Doppler and Color Doppler Indications:    CVA  History:        Patient has prior history of Echocardiogram examinations, most                 recent 07/28/2019. Stroke, Signs/Symptoms:Altered Mental Status                 and Pneumonia; Risk Factors:Pulmonary emboli, Hypertension and                 Former Smoker. Chronic Opiod use.  Sonographer:    Dustin Flock Referring Phys: [id number] [facility] Devi Hopman  Sonographer Comments: No parasternal window, no apical window and Technically difficult study due to poor echo windows. Image acquisition challenging due to respiratory motion and Image acquisition challenging due to breast implants.  Very limited images. The study is not interpretable. Consider TEE if clinically appropriate. Sanda Klein MD Electronically signed by Sanda Klein MD Signature Date/Time: 09/01/2020/4:48:01 PM    Final    VAS US CAROTID  Result Date: 09/01/2020 Carotid Arterial Duplex Study Indications:       CVA. Risk Factors:      Hypertension. Comparison Study:  07/27/19 previous Performing Technologist: Abram Sander RVS  Examination Guidelines: A complete evaluation includes B-mode imaging, spectral Doppler, color Doppler, and power Doppler as needed  of all accessible portions of each vessel. Bilateral testing is considered an integral part of a complete examination. Limited examinations for  reoccurring indications may be performed as noted.  Right Carotid Findings: +----------+--------+--------+--------+------------------+--------+           PSV cm/sEDV cm/sStenosisPlaque DescriptionComments +----------+--------+--------+--------+------------------+--------+ CCA Prox  91      14              heterogenous               +----------+--------+--------+--------+------------------+--------+ CCA Distal65      12              heterogenous               +----------+--------+--------+--------+------------------+--------+ ICA Prox  85      16      1-39%   heterogenous               +----------+--------+--------+--------+------------------+--------+ ICA Distal67      13                                         +----------+--------+--------+--------+------------------+--------+ ECA       95                                                 +----------+--------+--------+--------+------------------+--------+ +----------+--------+-------+--------+-------------------+           PSV cm/sEDV cmsDescribeArm Pressure (mmHG) +----------+--------+-------+--------+-------------------+ [id number]                                        +----------+--------+-------+--------+-------------------+ +---------+--------+--+--------+--+---------+ VertebralPSV cm/s39EDV cm/s11Antegrade +---------+--------+--+--------+--+---------+  Left Carotid Findings: +----------+--------+--------+--------+------------------+--------+           PSV cm/sEDV cm/sStenosisPlaque DescriptionComments +----------+--------+--------+--------+------------------+--------+ CCA Prox  88      17              heterogenous               +----------+--------+--------+--------+------------------+--------+ CCA Distal71      14              heterogenous               +----------+--------+--------+--------+------------------+--------+ ICA Prox  79      18      1-39%   heterogenous                +----------+--------+--------+--------+------------------+--------+ ICA Distal57      22                                         +----------+--------+--------+--------+------------------+--------+ ECA       82                                                 +----------+--------+--------+--------+------------------+--------+ +----------+--------+--------+--------+-------------------+           PSV cm/sEDV cm/sDescribeArm Pressure (mmHG) +----------+--------+--------+--------+-------------------+ [id number]                                         +----------+--------+--------+--------+-------------------+ +---------+--------+--+--------+--+---------+  VertebralPSV cm/s74EDV cm/s15Antegrade +---------+--------+--+--------+--+---------+   Summary: Right Carotid: Velocities in the right ICA are consistent with a 1-39% stenosis. Left Carotid: Velocities in the left ICA are consistent with a 1-39% stenosis. Vertebrals: Bilateral vertebral arteries demonstrate antegrade flow. *See table(s) above for measurements and observations.     Preliminary         Scheduled Meds: . amLODipine  2.5 mg Oral Daily  . aspirin EC  81 mg Oral Daily  . atorvastatin  40 mg Oral Daily  . cholecalciferol  1,000 Units Oral Daily  . clopidogrel  75 mg Oral Daily  . donepezil  5 mg Oral Daily  . enoxaparin (LOVENOX) injection  40 mg Subcutaneous Q24H  . escitalopram  20 mg Oral Daily  . feeding supplement (ENSURE ENLIVE)  237 mL Oral BID BM  . multivitamin with minerals  1 tablet Oral Daily  . sodium chloride flush  3 mL Intravenous Q12H   Continuous Infusions: . lactated ringers 50 mL/hr at 09/03/20 0648     LOS: 3 days   Time spent= 35 mins    Nickey Kloepfer Arsenio Loader, MD [facility]  If 7PM-7AM, please contact night-coverage  09/03/2020, 9:44 AM

## 2020-09-03 NOTE — Progress Notes (Signed)
[facility]) Outpatient Palliative Care  Referral received for outpatient palliative care.  ACC will plan to see pt when she discharges to continue with [place] conversations.  Venia Carbon RN, BSN, [facility] Liaison

## 2020-09-03 NOTE — Progress Notes (Signed)
STROKE TEAM PROGRESS NOTE   INTERVAL HISTORY No family at bedside. Pt lying in bed, still has aphasia and right hemiplegia. No acute event overnight, neuro stable. Repeat TTE pending. LE venous doppler also pending.   Vitals:   09/02/20 1531 09/02/20 2006 09/02/20 2306 09/03/20 0435  BP: (!) 147/82 126/69 (!) 143/67 (!) 165/82  Pulse: 70 72 67 79  Resp: 18 16 18 18   Temp: 98.2 F (36.8 C) 97.8 F (36.6 C) 97.7 F (36.5 C) 97.6 F (36.4 C)  TempSrc: Oral Oral Oral Oral  SpO2: 99% 98% 98% 100%  Height:       CBC:  Recent Labs  Lab 08/29/20 2300 08/31/20 0256 09/02/20 0256 09/03/20 0400  WBC 9.5   < > 8.0 7.5  NEUTROABS 5.6  --   --   --   HGB 14.9   < > 12.3 11.1*  HCT 46.0   < > 37.4 35.0*  MCV 87.8   < > 87.2 86.8  PLT 473*   < > 350 281   < > = values in this interval not displayed.   Basic Metabolic Panel:  Recent Labs  Lab 09/02/20 0256 09/03/20 0400  NA 137 137  K 3.7 4.1  CL 104 106  CO2 26 24  GLUCOSE 148* 109*  BUN 16 15  CREATININE 0.72 0.63  CALCIUM 9.4 9.0  MG 1.5* 2.0   Lipid Panel:  Recent Labs  Lab 08/31/20 1242  CHOL 161  TRIG 71  HDL 62  CHOLHDL 2.6  VLDL 14  LDLCALC 85   HgbA1c:  Recent Labs  Lab 08/31/20 1242  HGBA1C 6.0*   Urine Drug Screen:  Recent Labs  Lab 08/30/20 0204  LABOPIA NONE DETECTED  COCAINSCRNUR NONE DETECTED  LABBENZ NONE DETECTED  AMPHETMU NONE DETECTED  THCU NONE DETECTED  LABBARB NONE DETECTED    Alcohol Level  Recent Labs  Lab 08/29/20 2300  ETH <10    IMAGING past 24 hours  No results found.  PHYSICAL EXAM Pleasant frail elderly Caucasian lady not in distress . Afebrile. Head is nontraumatic. Neck is supple without bruit.    Cardiac exam no murmur or gallop, regular rhythm. Lungs are clear to auscultation. Distal pulses are well felt.  Neurological Exam Awake, alert, eyes open, able to say "I do not know", able to count fingers, but still has difficulty with spontaneous speech, difficulty  with naming or repetition. Able to follow limited midline and peripheral commands. Significant perseveration and paraphasic errors. Visual field full, no gaze deviation, right nasolabial fold flattening, tongue midline. LUE at least 4/5, RUE flaccid, LLE 4/5, RLE 1/5 proximal, 2+ knee flexion and 0/5 distally. Sensation, coordination not cooperative and gait not tested.   ASSESSMENT/PLAN Angelica Bell is a 86 y.o. female with history of pneumonia, PE, hypertension, hard of hearing, frequent falls along with question of dementia. She lives with her grandson who takes care of her.  Per notes on 8/31 her grandson noted that she was having nonsensical speech.  He at that point called 911.  Patient was brought to the hospital for further work-up for encephalopathy.  Part of the work-up included MRI brain.  Which does show that she has a left MCA distribution infarct.  For that reason neurology was consulted.  Stroke:  left MCA infarct, embolic pattern, source unclear.  CT head - Multiple remote infarcts. No evidence of acute intracranialhemorrhage or infarct.  MRI - Acute infarcts involving the left frontoparietal, insular and  occipitotemporal regions. Remote right centrum semiovale, right cerebellar and bilateral basalganglia insults.  MRA -  normal  Carotid Doppler -  bilateral 1-39% carotid stenosis.    2D Echo - limited windows, EF 60-65%    LE venous Doppler no DVT  Agree with Dr. Leonie Man, recommend 30-day Cardiac event monitor as outpatient to rule out A. fib  LDL - 85  HgbA1c - 6.0  VTE prophylaxis - lovenox  aspirin 81 mg daily and No antithrombotic prior to admission, now on aspirin 81 mg daily and clopidogrel 75 mg daily for 30 days and then ASA alone.   Therapy recommendations:  SNF  Disposition:  TBD  Hypertension  Home meds:  amlodipine  Stable . Long-term BP goal normotensive  Hyperlipidemia  Home meds:  none  LDL 85, goal < 70  Add atorvastatin 40 mg  daily  Continue statin at discharge  Other Stroke Risk Factors  Advanced age  ? Hx of PE  Other Active Problems  Dementia on aricept  Frequent falls  Hard of hearing  Palliative Care following   Hospital day # 3  Neurology will sign off. Please call with questions. Pt will follow up with stroke clinic NP at [facility] in about 4 weeks. Thanks for the consult.  Angelica Hawking, MD PhD Stroke Neurology 09/03/2020 1:16 PM  To contact Stroke Continuity provider, please refer to [email or web address] After hours, contact General Neurology

## 2020-09-03 NOTE — Progress Notes (Addendum)
VASCULAR LAB    Bilateral lower extremity venous duplex completed.    Preliminary report:  See CV proc for preliminary results  Naba Sneed, RVT 09/03/2020, 4:07 PM

## 2020-09-03 NOTE — Progress Notes (Signed)
*  PRELIMINARY RESULTS* Echocardiogram 2D Echocardiogram has been performed with [facility].  Samuel Germany 09/03/2020, 3:11 PM

## 2020-09-04 LAB — BASIC METABOLIC PANEL
Anion gap: 9 (ref 5–15)
BUN: 12 mg/dL (ref 8–23)
CO2: 26 mmol/L (ref 22–32)
Calcium: 9.5 mg/dL (ref 8.9–10.3)
Chloride: 99 mmol/L (ref 98–111)
Creatinine, Ser: 0.79 mg/dL (ref 0.44–1.00)
GFR calc Af Amer: >60 mL/min (ref >60)
GFR calc non Af Amer: >60 mL/min (ref >60)
Glucose, Bld: 106 mg/dL — ABNORMAL HIGH (ref 70–99)
Potassium: 4 mmol/L (ref 3.5–5.1)
Sodium: 134 mmol/L — ABNORMAL LOW (ref 135–145)

## 2020-09-04 LAB — CBC
HCT: 39.8 % (ref 36.0–46.0)
Hemoglobin: 12.9 g/dL (ref 12.0–15.0)
MCH: 28.4 pg (ref 26.0–34.0)
MCHC: 32.4 g/dL (ref 30.0–36.0)
MCV: 87.5 fL (ref 80.0–100.0)
Platelets: 330 10*3/uL (ref 150–400)
RBC: 4.55 MIL/uL (ref 3.87–5.11)
RDW: 13.4 % (ref 11.5–15.5)
WBC: 9.8 10*3/uL (ref 4.0–10.5)
nRBC: 0 % (ref 0.0–0.2)

## 2020-09-04 LAB — MAGNESIUM: Magnesium: 1.7 mg/dL (ref 1.7–2.4)

## 2020-09-04 NOTE — Progress Notes (Signed)
Physical Therapy Treatment Patient Details Name: Angelica Bell MRN: [id number] DOB: [birth year] Today's Date: 09/04/2020    History of Present Illness Pt is a 86 yo female who presents to the hospital with a R facial droop, dysarthria and R sided weakness. Pt was noted to have a L frontoparietal, insular and occipitotemporal infarct. Pt has a PMH of Pneumonia, PE, HTN, HOH and Pulmonary HTN    PT Comments    Pt participated more in therapy today attempting to physically help get to EOB.  She fatigues quickly and reports dizziness EOB.  She was positioned back in bed at the end of the session and assisted in self feeding with her left hand.  PT will continue to follow acutely for safe mobility progression.     Follow Up Recommendations  SNF;Supervision/Assistance - 24 hour     Equipment Recommendations  Wheelchair (measurements PT);Wheelchair cushion (measurements PT);Hospital bed    Recommendations for Other Services       Precautions / Restrictions Precautions Precautions: Fall Precaution Comments: R sided weakness    Mobility  Bed Mobility Overal bed mobility: Needs Assistance Bed Mobility: Supine to Sit;Sit to Supine     Supine to sit: Max assist;HOB elevated Sit to supine: Max assist;HOB elevated   General bed mobility comments: Max assist to help progress bil legs to EOB, support at trunk to come to sitting EOB, pt able to attempt to pull up to sitting with her left hand and followed therapist's initiation of movement towards the side of the bed. Max to total assist to scoot due to fear that she was falling.  Pt was able to indicate that she was very dizzy on the side of the bed.    Transfers                 General transfer comment: Pt did not want to attempt, indicated she wanted to get back into the bed after ~8 mins seated EOB.   Ambulation/Gait                 Stairs             Wheelchair Mobility    Modified Rankin (Stroke Patients  Only)       Balance Overall balance assessment: Needs assistance Sitting-balance support: Feet supported;Single extremity supported Sitting balance-Leahy Scale: Zero Sitting balance - Comments: max assist EOB left lateral lean Postural control: Left lateral lean                                  Cognition Arousal/Alertness: Awake/alert   Overall Cognitive Status: Difficult to assess Area of Impairment: Following commands;Awareness                       Following Commands: Follows one step commands inconsistently;Follows one step commands with increased time   Awareness: Emergent (able to indicate her R side is weak, fearful of falling)   General Comments: Pt has significant aphasia, difficult to assess cognition fully      Exercises      General Comments General comments (skin integrity, edema, etc.): Pt requested therapist assist her in eating lunch at end of session.  Pt did well with set up and encouragement.  Assist mostly to encourage her to try and to open packages/containers.  Pt able to self feed with left hand, wipe mouth with left hand, but fatigued quickl and only ate ~  10% of what was on her plate.        Pertinent Vitals/Pain Pain Assessment: Faces Pain Score: 0-No pain    Home Living                      Prior Function            PT Goals (current goals can now be found in the care plan section) Acute Rehab PT Goals Patient Stated Goal: unable to state, difficulty communicating Progress towards PT goals: Progressing toward goals    Frequency    Min 3X/week      PT Plan Current plan remains appropriate    Co-evaluation              AM-PAC PT "6 Clicks" Mobility   Outcome Measure  Help needed turning from your back to your side while in a flat bed without using bedrails?: Total Help needed moving from lying on your back to sitting on the side of a flat bed without using bedrails?: Total Help needed moving  to and from a bed to a chair (including a wheelchair)?: Total Help needed standing up from a chair using your arms (e.g., wheelchair or bedside chair)?: Total Help needed to walk in hospital room?: Total Help needed climbing 3-5 steps with a railing? : Total 6 Click Score: 6    End of Session   Activity Tolerance: Patient limited by fatigue Patient left: in bed;with call bell/phone within reach;with bed alarm set Nurse Communication: Mobility status PT Visit Diagnosis: Muscle weakness (generalized) (M62.81);Hemiplegia and hemiparesis;Pain Hemiplegia - Right/Left: Right Hemiplegia - dominant/non-dominant: Dominant Hemiplegia - caused by: Cerebral infarction     Time: 2119-4174 PT Time Calculation (min) (ACUTE ONLY): 34 min  Charges:  $Therapeutic Activity: 23-37 mins                     Verdene Lennert, PT, DPT  Acute Rehabilitation [phone] pager [phone]) [phone] office

## 2020-09-04 NOTE — Plan of Care (Signed)
Pt alert. Pain controlled with PRN.  Problem: Education: Goal: Knowledge of General Education information will improve Description: Including pain rating scale, medication(s)/side effects and non-pharmacologic comfort measures Outcome: Progressing   Problem: Health Behavior/Discharge Planning: Goal: Ability to manage health-related needs will improve Outcome: Progressing   Problem: Clinical Measurements: Goal: Ability to maintain clinical measurements within normal limits will improve Outcome: Progressing Goal: Will remain free from infection Outcome: Progressing Goal: Diagnostic test results will improve Outcome: Progressing Goal: Respiratory complications will improve Outcome: Progressing Goal: Cardiovascular complication will be avoided Outcome: Progressing   Problem: Activity: Goal: Risk for activity intolerance will decrease Outcome: Progressing   Problem: Nutrition: Goal: Adequate nutrition will be maintained Outcome: Progressing   Problem: Coping: Goal: Level of anxiety will decrease Outcome: Progressing   Problem: Elimination: Goal: Will not experience complications related to bowel motility Outcome: Progressing Goal: Will not experience complications related to urinary retention Outcome: Progressing   Problem: Pain Managment: Goal: General experience of comfort will improve Outcome: Progressing   Problem: Safety: Goal: Ability to remain free from injury will improve Outcome: Progressing   Problem: Skin Integrity: Goal: Risk for impaired skin integrity will decrease Outcome: Progressing   Problem: Education: Goal: Knowledge of disease or condition will improve Outcome: Progressing Goal: Knowledge of secondary prevention will improve Outcome: Progressing Goal: Knowledge of patient specific risk factors addressed and post discharge goals established will improve Outcome: Progressing Goal: Individualized Educational Video(s) Outcome: Progressing    Problem: Coping: Goal: Will verbalize positive feelings about self Outcome: Progressing Goal: Will identify appropriate support needs Outcome: Progressing   Problem: Health Behavior/Discharge Planning: Goal: Ability to manage health-related needs will improve Outcome: Progressing   Problem: Self-Care: Goal: Ability to participate in self-care as condition permits will improve Outcome: Progressing Goal: Verbalization of feelings and concerns over difficulty with self-care will improve Outcome: Progressing Goal: Ability to communicate needs accurately will improve Outcome: Progressing   Problem: Nutrition: Goal: Risk of aspiration will decrease Outcome: Progressing   Problem: Ischemic Stroke/TIA Tissue Perfusion: Goal: Complications of ischemic stroke/TIA will be minimized Outcome: Progressing

## 2020-09-04 NOTE — Progress Notes (Signed)
PROGRESS NOTE    Angelica Bell  XBJ:[id number] DOB: 1932/10/29 DOA: 08/29/2020 PCP: Heywood Bene, PA-C   Brief Narrative:  Patient is 86 year old female with history of remote PE, chronic pain, hypertension who presented with altered mental status. MRI done on presentation showed acute CVA. Neurology was consulted and was following. Stroke work-up completed. PT/OT recommend discussing facility. Currently waiting for bed. She is hemodynamically stable for discharge to SNF as soon as  bed is available.  Assessment & Plan:   Principal Problem:   Acute CVA (cerebrovascular accident) ([place]) Active Problems:   Mononeuritis of lower limb   Chronic pain   Essential hypertension   Altered mental status   Cerebrovascular accident (CVA) ([place])   Acute metabolic encephalopathy   Palliative care by specialist   Goals of care, counseling/discussion   DNR (do not resuscitate) discussion  Acute CVA: MRI showed acute left frontotemporal, insular and occipital temporal infarct. MRI did not show any abnormality. Echo showed ejection fraction of 66 5%. Carotid Doppler did not show any significant stenosis. Neurology was consulted and following. Recommended 30-day cardiac event monitoring as an outpatient to rule out A. Fib. Will follow up with cardiology team for setting up outpatient follow-up before DC. LDL of 85. Hemoglobin A1c of 6. Neurology recommended aspirin and Plavix for 30 days then aspirin alone. PT/OT consulted and recommended SNF.  Hypertension: Mildly hypertensive today. Continue amlodipine. Continue to monitor blood pressure  Chronic pain syndrome: Continue supportive care.  Vitamin D deficiency: Continue oral supplements  Externally rotated hip: X-ray of the right hip/pelvis did not show any acute fracture dislocation. Continue supportive care and physical therapy.  Dementia: Pleasantly confused. Continue Aricept and Lexapro.  Severe protein calorie malnutrition:  Continue supportive care, encourage oral intake.  Goals of care: Very elderly female with stroke and other comorbidities .palliative  care was consulted during this hospitalization. She will enroll with hospice as an outpatient.   Nutrition Problem: Inadequate oral intake Etiology: acute illness      DVT prophylaxis: Lovenox Code Status: Partial Family Communication: Called and discussed with daughter on phone on 09/04/20 Status is: Inpatient  Remains inpatient appropriate because:Unsafe d/c plan   Dispo: The patient is from: Home              Anticipated d/c is to: SNF              Anticipated d/c date is: 1 day              Patient currently is medically stable to d/c.    Consultants: Neurology, palliative care  Procedures: None  Antimicrobials:  Anti-infectives (From admission, onward)   None      Subjective: Patient seen and examined the bedside this morning.  Hemodynamically stable.  Not in any kind of distress.  Very confused and does not follow commands.  Objective: Vitals:   09/03/20 1950 09/03/20 2336 09/04/20 0437 09/04/20 0828  BP: (!) 143/78 (!) 147/82 (!) 158/85 (!) 154/81  Pulse: 76 82 77 85  Resp: 18 18 18 18   Temp: 98.6 F (37 C) 98.2 F (36.8 C) 98.2 F (36.8 C) 98.7 F (37.1 C)  TempSrc: Oral Oral Oral Oral  SpO2: 96% 94% 95% 97%  Height:        Intake/Output Summary (Last 24 hours) at 09/04/2020 0900 Last data filed at 09/04/2020 0805 Gross per 24 hour  Intake 863.24 ml  Output --  Net 863.24 ml   There were no vitals filed  for this visit.  Examination:  General exam: Very elderly deconditioned female, not in distress, severely malnourished HEENT:PERRL,Oral mucosa moist, Ear/Nose normal on gross exam Respiratory system: Bilateral equal air entry, normal vesicular breath sounds, no wheezes or crackles  Cardiovascular system: S1 & S2 heard, RRR. No JVD, murmurs, rubs, gallops or clicks. No pedal edema. Gastrointestinal system: Abdomen  is nondistended, soft and nontender. No organomegaly or masses felt. Normal bowel sounds heard. Central nervous system: Not alert and oriented.  Does not obey commands Extremities: No edema, no clubbing ,no cyanosis Skin: No rashes, lesions or ulcers,no icterus ,no pallor   Data Reviewed: I have personally reviewed following labs and imaging studies  CBC: Recent Labs  Lab 08/29/20 2300 08/29/20 2300 08/31/20 0256 08/31/20 1242 09/01/20 1439 09/02/20 0256 09/03/20 0400 09/04/20 0325  WBC 9.5   < > 10.3  --  10.2 8.0 7.5 9.8  NEUTROABS 5.6  --   --   --   --   --   --   --   HGB 14.9   < > 13.9  --  12.0 12.3 11.1* 12.9  HCT 46.0   < > 42.5 43.7 37.7 37.4 35.0* 39.8  MCV 87.8   < > 86.9  --  86.9 87.2 86.8 87.5  PLT 473*   < > 418*  --  367 350 281 330   < > = values in this interval not displayed.   Basic Metabolic Panel: Recent Labs  Lab 08/31/20 0256 09/01/20 1439 09/02/20 0256 09/03/20 0400 09/04/20 0325  NA 138 136 137 137 134*  K 3.5 4.0 3.7 4.1 4.0  CL 104 104 104 106 99  CO2 22 23 26 24 26   GLUCOSE 99 121* 148* 109* 106*  BUN 24* 19 16 15 12   CREATININE 0.75 0.77 0.72 0.63 0.79  CALCIUM 9.7 9.5 9.4 9.0 9.5  MG  --  1.6* 1.5* 2.0 1.7   GFR: CrCl cannot be calculated (Unknown ideal weight.). Liver Function Tests: Recent Labs  Lab 08/29/20 2300  AST 25  ALT 15  ALKPHOS 103  BILITOT 0.9  PROT 6.7  ALBUMIN 3.8   No results for input(s): LIPASE, AMYLASE in the last 168 hours. Recent Labs  Lab 08/29/20 2300  AMMONIA 22   Coagulation Profile: No results for input(s): INR, PROTIME in the last 168 hours. Cardiac Enzymes: Recent Labs  Lab 08/29/20 2300  CKTOTAL 29*   BNP (last 3 results) No results for input(s): PROBNP in the last 8760 hours. HbA1C: No results for input(s): HGBA1C in the last 72 hours. CBG: Recent Labs  Lab 08/30/20 0724  GLUCAP 117*   Lipid Profile: No results for input(s): CHOL, HDL, LDLCALC, TRIG, CHOLHDL, LDLDIRECT in  the last 72 hours. Thyroid Function Tests: No results for input(s): TSH, T4TOTAL, FREET4, T3FREE, THYROIDAB in the last 72 hours. Anemia Panel: No results for input(s): VITAMINB12, FOLATE, FERRITIN, TIBC, IRON, RETICCTPCT in the last 72 hours. Sepsis Labs: Recent Labs  Lab 08/29/20 2300  LATICACIDVEN 1.6    Recent Results (from the past 240 hour(s))  SARS Coronavirus 2 by RT PCR (hospital order, performed in [facility] hospital lab) Nasopharyngeal Nasopharyngeal Swab     Status: None   Collection Time: 08/29/20 11:00 PM   Specimen: Nasopharyngeal Swab  Result Value Ref Range Status   SARS Coronavirus 2 NEGATIVE NEGATIVE Final    Comment: (NOTE) SARS-CoV-2 target nucleic acids are NOT DETECTED.  The SARS-CoV-2 RNA is generally detectable in upper and lower  respiratory specimens during the acute phase of infection. The lowest concentration of SARS-CoV-2 viral copies this assay can detect is 250 copies / mL. A negative result does not preclude SARS-CoV-2 infection and should not be used as the sole basis for treatment or other patient management decisions.  A negative result may occur with improper specimen collection / handling, submission of specimen other than nasopharyngeal swab, presence of viral mutation(s) within the areas targeted by this assay, and inadequate number of viral copies (<250 copies / mL). A negative result must be combined with clinical observations, patient history, and epidemiological information.  Fact Sheet for Patients:   StrictlyIdeas.no  Fact Sheet for Healthcare Providers: BankingDealers.co.za  This test is not yet approved or  cleared by the [place] FDA and has been authorized for detection and/or diagnosis of SARS-CoV-2 by FDA under an Emergency Use Authorization (EUA).  This EUA will remain in effect (meaning this test can be used) for the duration of the COVID-19 declaration under Section  564(b)(1) of the Act, 21 U.S.C. section 360bbb-3(b)(1), unless the authorization is terminated or revoked sooner.  Performed at [facility], [place] [address] [place], [place] [address]          Radiology Studies: ECHOCARDIOGRAM COMPLETE  Result Date: 09/03/2020    ECHOCARDIOGRAM REPORT   Patient Name:   ZAYONNA AYUSO [facility] Date of Exam: 09/03/2020 Medical Rec #:  [id number]          Height:       66.0 in Accession #:    [id number]         Weight:       105.8 lb Date of Birth:  1932/12/04          BSA:          1.526 m Patient Age:    68 years           BP:           152/69 mmHg Patient Gender: F                  HR:           76 bpm. Exam Location:  Inpatient Procedure: 2D Echo, Cardiac Doppler and Color Doppler Indications:    Stroke 434.91 / I163.9  History:        Patient has prior history of Echocardiogram examinations, most                 recent 09/01/2020. Stroke; Risk Factors:Hypertension and                 Dyslipidemia. Altered mental status, Drug overdose, PE                 (pulmonary embolism) (From Hx).  Sonographer:    Alvino Chapel RCS Referring Phys: [id number]  1. Left ventricular ejection fraction, by estimation, is 60 to 65%. The left ventricle has normal function. Left ventricular endocardial border not optimally defined to evaluate regional wall motion. There is not well visualized left ventricular hypertrophy. Left ventricular diastolic parameters are indeterminate.  2. Right ventricular systolic function was not well visualized. The right ventricular size is not well visualized. There is normal pulmonary artery systolic pressure.  3. The mitral valve is abnormal. not well visualized mitral valve regurgitation.  4. Tricuspid valve regurgitation not well visualized.  5. The aortic valve was not well visualized. Aortic valve regurgitation is not visualized.  6. Pulmonic valve regurgitation not  well visualized. Conclusion(s)/Recommendation(s): Very difficult  windows, even with use of echo contrast. Nearly no parasternal windows, and very limited apical pictures. Grossly normal LVEF, though reduced sensitivity for focal wall motion abnormalities. Mitral valve appears calcified/thickened. Given two attempts with TTE, recommend alternative assessment for further evaluation, such as TEE. FINDINGS  Left Ventricle: Left ventricular ejection fraction, by estimation, is 60 to 65%. The left ventricle has normal function. Left ventricular endocardial border not optimally defined to evaluate regional wall motion. Definity contrast agent was given IV to delineate the left ventricular endocardial borders. The left ventricular internal cavity size was normal in size. There is not well visualized left ventricular hypertrophy. Left ventricular diastolic parameters are indeterminate. Right Ventricle: The right ventricular size is not well visualized. Right vetricular wall thickness was not assessed. Right ventricular systolic function was not well visualized. There is normal pulmonary artery systolic pressure. The tricuspid regurgitant velocity is 2.32 m/s, and with an assumed right atrial pressure of 8 mmHg, the estimated right ventricular systolic pressure is 18.8 mmHg. Left Atrium: Left atrial size was not well visualized. Right Atrium: Right atrial size was not well visualized. Pericardium: The pericardium was not well visualized. Mitral Valve: The mitral valve is abnormal. Not well visualized mitral valve regurgitation. MV peak gradient, 7.5 mmHg. The mean mitral valve gradient is 2.0 mmHg. Tricuspid Valve: The tricuspid valve is not well visualized. Tricuspid valve regurgitation not well visualized. Aortic Valve: The aortic valve was not well visualized. Aortic valve regurgitation is not visualized. Pulmonic Valve: The pulmonic valve was not well visualized. Pulmonic valve regurgitation not well visualized. Aorta: The aortic root was not well visualized, the ascending aorta was  not well visualized and the aortic arch was not well visualized. Venous: The inferior vena cava was not well visualized. IAS/Shunts: The interatrial septum was not well visualized.   Diastology LV e' lateral:   7.62 cm/s LV E/e' lateral: 8.0 LV e' medial:    6.09 cm/s LV E/e' medial:  10.0  RIGHT VENTRICLE RV S prime:     12.60 cm/s TAPSE (M-mode): 1.7 cm LEFT ATRIUM           Index       RIGHT ATRIUM           Index LA Vol (A4C): 40.7 ml 26.67 ml/m RA Area:     14.90 cm                                   RA Volume:   39.60 ml  25.95 ml/m  AORTIC VALVE LVOT Vmax:   95.80 cm/s LVOT Vmean:  64.300 cm/s LVOT VTI:    0.150 m MITRAL VALVE                TRICUSPID VALVE MV Area (PHT): 2.65 cm     TR Peak grad:   21.5 mmHg MV Peak grad:  7.5 mmHg     TR Vmax:        232.00 cm/s MV Mean grad:  2.0 mmHg MV Vmax:       1.37 m/s     SHUNTS MV Vmean:      51.7 cm/s    Systemic VTI: 0.15 m MV Decel Time: 286 msec MV E velocity: 60.70 cm/s MV A velocity: 133.00 cm/s MV E/A ratio:  0.46 Buford Dresser MD Electronically signed by Buford Dresser MD Signature Date/Time: 09/03/2020/3:34:35 PM    Final  VAS [place] LOWER EXTREMITY VENOUS (DVT)  Result Date: 09/03/2020  Lower Venous DVT Study Indications: Stroke.  Limitations: Body habitus and patient positioning. Comparison Study: Prior study from 08/31/2019 is available for comparison Performing Technologist: Sharion Dove RVS  Examination Guidelines: A complete evaluation includes B-mode imaging, spectral Doppler, color Doppler, and power Doppler as needed of all accessible portions of each vessel. Bilateral testing is considered an integral part of a complete examination. Limited examinations for reoccurring indications may be performed as noted. The reflux portion of the exam is performed with the patient in reverse Trendelenburg.  +---------+---------------+---------+-----------+----------+-------------------+ RIGHT     CompressibilityPhasicitySpontaneityPropertiesThrombus Aging      +---------+---------------+---------+-----------+----------+-------------------+ CFV                                                   Not visualized      +---------+---------------+---------+-----------+----------+-------------------+ SFJ                                                   Not visualized      +---------+---------------+---------+-----------+----------+-------------------+ FV Prox                 Yes      Yes                  patent by color and                                                       Doppler             +---------+---------------+---------+-----------+----------+-------------------+ FV Mid                                                patent by color and                                                       Doppler             +---------+---------------+---------+-----------+----------+-------------------+ FV Distal                                             patent by color and                                                       Doppler             +---------+---------------+---------+-----------+----------+-------------------+ PFV  Not visualized      +---------+---------------+---------+-----------+----------+-------------------+ POP      Full           Yes      Yes                                      +---------+---------------+---------+-----------+----------+-------------------+ PTV      Full                                                             +---------+---------------+---------+-----------+----------+-------------------+ PERO     Full                                                             +---------+---------------+---------+-----------+----------+-------------------+   +---------+---------------+---------+-----------+----------+-------------------+ LEFT      CompressibilityPhasicitySpontaneityPropertiesThrombus Aging      +---------+---------------+---------+-----------+----------+-------------------+ CFV      Full           Yes      Yes                                      +---------+---------------+---------+-----------+----------+-------------------+ SFJ      Full                                                             +---------+---------------+---------+-----------+----------+-------------------+ FV Prox  Full                                                             +---------+---------------+---------+-----------+----------+-------------------+ FV Mid   Full                                                             +---------+---------------+---------+-----------+----------+-------------------+ FV DistalFull                                                             +---------+---------------+---------+-----------+----------+-------------------+ PFV      Full                                                             +---------+---------------+---------+-----------+----------+-------------------+  POP                                                   patent by color and                                                       Doppler             +---------+---------------+---------+-----------+----------+-------------------+ PTV      Full                                                             +---------+---------------+---------+-----------+----------+-------------------+ PERO     Full                                                             +---------+---------------+---------+-----------+----------+-------------------+     Summary: RIGHT: - Findings appear essentially unchanged compared to previous examination. - There is no evidence of deep vein thrombosis in the lower extremity. However, portions of this examination were limited- see technologist comments above.  LEFT: -  Findings appear essentially unchanged compared to previous examination. - There is no evidence of deep vein thrombosis in the lower extremity. However, portions of this examination were limited- see technologist comments above.  *See table(s) above for measurements and observations.    Preliminary         Scheduled Meds: . amLODipine  2.5 mg Oral Daily  . aspirin EC  81 mg Oral Daily  . atorvastatin  40 mg Oral Daily  . cholecalciferol  1,000 Units Oral Daily  . clopidogrel  75 mg Oral Daily  . donepezil  5 mg Oral Daily  . enoxaparin (LOVENOX) injection  40 mg Subcutaneous Q24H  . escitalopram  20 mg Oral Daily  . feeding supplement (ENSURE ENLIVE)  237 mL Oral BID BM  . multivitamin with minerals  1 tablet Oral Daily  . sodium chloride flush  3 mL Intravenous Q12H   Continuous Infusions: . lactated ringers 50 mL/hr at 09/03/20 2321     LOS: 4 days    Time spent: 25 mins.More than 50% of that time was spent in counseling and/or coordination of care.      Shelly Coss, MD [facility] P9/05/2020, 9:00 AM

## 2020-09-04 NOTE — TOC Progression Note (Signed)
Transition of Care [facility]) - Progression Note    Patient Details  Name: Angelica Bell MRN: [id number] Date of Birth: 24-Feb-1932  Transition of Care [facility]) CM/SW [place], [place] Phone Number: 09/04/2020, 12:33 PM  Clinical Narrative:   CSW received bed offer for patient at [facility]. CSW initiated insurance auth request through [name]. Awaiting response from insurance with approval.     Expected Discharge Plan: [place] Barriers to Discharge: Ship broker, Continued Medical Work up  Expected Discharge Plan and Services Expected Discharge Plan: [place] Choice: [place] arrangements for the past 2 months: Single Family Home                                       Social Determinants of Health (SDOH) Interventions    Readmission Risk Interventions Readmission Risk Prevention Plan 05/15/2020  Transportation Screening Complete  PCP or Specialist Appt within 3-5 Days Complete  HRI or [place] Complete  Social Work Consult for [place] Planning/Counseling Complete  Palliative Care Screening Complete  Medication Review Press photographer) Complete  Some recent data might be hidden

## 2020-09-05 LAB — CBC
HCT: 37.4 % (ref 36.0–46.0)
Hemoglobin: 11.8 g/dL — ABNORMAL LOW (ref 12.0–15.0)
MCH: 27.4 pg (ref 26.0–34.0)
MCHC: 31.6 g/dL (ref 30.0–36.0)
MCV: 86.8 fL (ref 80.0–100.0)
Platelets: 284 10*3/uL (ref 150–400)
RBC: 4.31 MIL/uL (ref 3.87–5.11)
RDW: 13.5 % (ref 11.5–15.5)
WBC: 9.2 10*3/uL (ref 4.0–10.5)
nRBC: 0 % (ref 0.0–0.2)

## 2020-09-05 LAB — BASIC METABOLIC PANEL
Anion gap: 8 (ref 5–15)
BUN: 21 mg/dL (ref 8–23)
CO2: 28 mmol/L (ref 22–32)
Calcium: 9.6 mg/dL (ref 8.9–10.3)
Chloride: 101 mmol/L (ref 98–111)
Creatinine, Ser: 0.75 mg/dL (ref 0.44–1.00)
GFR calc Af Amer: >60 mL/min (ref >60)
GFR calc non Af Amer: >60 mL/min (ref >60)
Glucose, Bld: 110 mg/dL — ABNORMAL HIGH (ref 70–99)
Potassium: 4.1 mmol/L (ref 3.5–5.1)
Sodium: 137 mmol/L (ref 135–145)

## 2020-09-05 LAB — MAGNESIUM: Magnesium: 1.7 mg/dL (ref 1.7–2.4)

## 2020-09-05 LAB — SARS CORONAVIRUS 2 BY RT PCR (HOSPITAL ORDER, PERFORMED IN ~~LOC~~ HOSPITAL LAB): SARS Coronavirus 2: NEGATIVE

## 2020-09-05 MED ORDER — OXYCODONE HCL 5 MG PO TABS
5.0000 mg | ORAL_TABLET | Freq: Four times a day (QID) | ORAL | 0 refills | Status: DC | PRN
Start: 2020-09-05 — End: 2020-10-13

## 2020-09-05 MED ORDER — ATORVASTATIN CALCIUM 40 MG PO TABS: 40.0000 mg | ORAL_TABLET | Freq: Every day | ORAL | Status: AC

## 2020-09-05 MED ORDER — CLOPIDOGREL BISULFATE 75 MG PO TABS: 75.0000 mg | ORAL_TABLET | Freq: Every day | ORAL | 0 refills | Status: AC

## 2020-09-05 MED ORDER — VITAMIN D3 25 MCG PO TABS: 1000.0000 [IU] | ORAL_TABLET | Freq: Every day | ORAL | Status: AC

## 2020-09-05 NOTE — Care Management Important Message (Signed)
Important Message  Patient Details  Name: Angelica Bell MRN: [id number] Date of Birth: [birth year]   Medicare Important Message Given:  Yes     Enos Muhl [address] 09/05/2020, 3:18 PM

## 2020-09-05 NOTE — Discharge Summary (Signed)
Physician Discharge Summary  Angelica Bell FTD:[id number] DOB: 1932-04-12 DOA: 08/29/2020  PCP: Heywood Bene, PA-C  Admit date: 08/29/2020 Discharge date: 09/05/2020  Admitted From: Home Disposition: SNF  Discharge Condition:Stable CODE STATUS:Partial Diet recommendation: Heart Healthy   Brief/Interim Summary: Patient is 86 year old female with history of remote PE, chronic pain, hypertension who presented with altered mental status. MRI done on presentation showed acute CVA. Neurology was consulted and was following. Stroke work-up completed. PT/OT recommend discussing facility.  She is medically stable for discharge to skilled nursing facility today.  Following problems were addressed during her hospitalization:  Acute CVA: MRI showed acute left frontotemporal, insular and occipital temporal infarct. MRI did not show any abnormality. Echo showed ejection fraction of 66 5%. Carotid Doppler did not show any significant stenosis. Neurology was consulted and following. Recommended 30-day cardiac event monitoring as an outpatient to rule out A. Fib.  Cardiology will set up appointment for 30-day event monitoring as an outpatient.   LDL of 85. Hemoglobin A1c of 6. Neurology recommended aspirin and Plavix for 30 days then aspirin alone. PT/OT consulted and recommended SNF.  Hypertension: BP stable. Continue amlodipine  Chronic pain syndrome: Continue supportive care.  Vitamin D deficiency: Continue oral supplements  Externally rotated hip: X-ray of the right hip/pelvis did not show any acute fracture dislocation. Continue supportive care and physical therapy.  Dementia: Pleasantly confused. Continue Aricept and Lexapro.  Severe protein calorie malnutrition: Continue supportive care, encourage oral intake.  Goals of care: Very elderly female with stroke and other comorbidities .palliative  care was consulted during this hospitalization.  She should follow-up with  palliative care as an outpatient.   Discharge Diagnoses:  Principal Problem:   Acute CVA (cerebrovascular accident) ([place]) Active Problems:   Mononeuritis of lower limb   Chronic pain   Essential hypertension   Altered mental status   Cerebrovascular accident (CVA) ([place])   Acute metabolic encephalopathy   Palliative care by specialist   Goals of care, counseling/discussion   DNR (do not resuscitate) discussion    Discharge Instructions  Discharge Instructions    Ambulatory referral to Neurology   Complete by: As directed    Follow up with Dr. Leonie Man at [facility] in 4 weeks. Too complicated for NP to follow. Thanks.   Diet - low sodium heart healthy   Complete by: As directed    Discharge instructions   Complete by: As directed    1)Take prescribed medications as instructed. 2)Follow up with neurology as an outpatient.  Name and number of the provider has been attached.   Increase activity slowly   Complete by: As directed      Allergies as of 09/05/2020      Reactions   Hctz [hydrochlorothiazide]    Significant and rapid hyponatremia (TIH)   Amoxicillin-pot Clavulanate Hives, Diarrhea   Has patient had a PCN reaction causing immediate rash, facial/tongue/throat swelling, SOB or lightheadedness with hypotension: yes Has patient had a PCN reaction causing severe rash involving mucus membranes or skin necrosis: yes Has patient had a PCN reaction that required hospitalization : unknown Has patient had a PCN reaction occurring within the last 10 years: unknown If all of the above answers are "NO", then may proceed with Cephalosporin use.   Lisinopril-hydrochlorothiazide Other (See Comments)   Tired- no energy to do anything..   Sulfa Antibiotics Hives, Nausea And Vomiting   Sulfonamide Derivatives Hives      Medication List    TAKE these medications   acetaminophen  500 MG tablet Commonly known as: TYLENOL Take 500 mg by mouth every 6 (six) hours as needed for moderate pain.    amLODipine 2.5 MG tablet Commonly known as: NORVASC Take 1 tablet (2.5 mg total) by mouth daily.   aspirin 81 MG EC tablet Take 1 tablet (81 mg total) by mouth daily.   atorvastatin 40 MG tablet Commonly known as: LIPITOR Take 1 tablet (40 mg total) by mouth daily.   clopidogrel 75 MG tablet Commonly known as: PLAVIX Take 1 tablet (75 mg total) by mouth daily. Stop after 25 days Start taking on: September 06, 2020   donepezil 5 MG tablet Commonly known as: ARICEPT Take 5 mg by mouth daily.   escitalopram 20 MG tablet Commonly known as: LEXAPRO Take 20 mg by mouth daily.   feeding supplement (ENSURE ENLIVE) Liqd Take 237 mLs by mouth 2 (two) times daily between meals.   gabapentin 100 MG capsule Commonly known as: NEURONTIN Take 1 capsule (100 mg total) by mouth 3 (three) times daily. What changed:   when to take this  reasons to take this   multivitamin with minerals Tabs tablet Take 1 tablet by mouth daily.   oxyCODONE 5 MG immediate release tablet Commonly known as: Oxy IR/ROXICODONE Take 1 tablet (5 mg total) by mouth every 6 (six) hours as needed for severe pain. What changed:   medication strength  how much to take  when to take this  reasons to take this   Vitamin D3 25 MCG tablet Commonly known as: Vitamin D Take 1 tablet (1,000 Units total) by mouth daily.       Contact information for follow-up providers    Garvin Fila, MD. Schedule an appointment as soon as possible for a visit in 4 week(s).   Specialties: Neurology, Radiology Contact information: [address] [place] [address] [phone]            Contact information for after-discharge care    Destination    [facility] .   Service: Skilled Nursing Contact information: [place] [phone]                 Allergies  Allergen Reactions  . Hctz [Hydrochlorothiazide]     Significant and rapid  hyponatremia (TIH)  . Amoxicillin-Pot Clavulanate Hives and Diarrhea    Has patient had a PCN reaction causing immediate rash, facial/tongue/throat swelling, SOB or lightheadedness with hypotension: yes Has patient had a PCN reaction causing severe rash involving mucus membranes or skin necrosis: yes Has patient had a PCN reaction that required hospitalization : unknown Has patient had a PCN reaction occurring within the last 10 years: unknown If all of the above answers are "NO", then may proceed with Cephalosporin use.   Marland Kitchen Lisinopril-Hydrochlorothiazide Other (See Comments)    Tired- no energy to do anything..  . Sulfa Antibiotics Hives and Nausea And Vomiting  . Sulfonamide Derivatives Hives    Consultations:  Neurology   Procedures/Studies: DG Chest 1 View  Result Date: 08/29/2020 CLINICAL DATA:  Altered mental status EXAM: CHEST  1 VIEW COMPARISON:  05/12/2020 FINDINGS: Lungs are clear. No pneumothorax or pleural effusion. Cardiac size within normal limits. The pulmonary vascularity is normal. Moderate to severe thoracolumbar sigmoid scoliosis is again noted. IMPRESSION: No active disease. Electronically Signed   By: Fidela Salisbury MD   On: 08/29/2020 23:00   CT Head Wo Contrast  Result Date: 08/29/2020 CLINICAL DATA:  Delirium EXAM: CT HEAD WITHOUT CONTRAST TECHNIQUE: Contiguous axial images were obtained from the base of the skull through the vertex without intravenous contrast. COMPARISON:  05/12/2020 FINDINGS: Brain: Remote high right frontal subcortical white matter and right cerebellar infarcts are again noted. Remote lacunar infarct within the right basal ganglia again noted. Mild to moderate parenchymal volume loss is commensurate with the patient's age. Moderate periventricular white matter changes are present likely reflecting the sequela of small vessel ischemia. No evidence of acute intracranial hemorrhage or infarct. No abnormal mass effect or midline shift. No abnormal  intra or extra-axial mass lesion or fluid collection. Ventricular size is normal. Cerebellum is otherwise unremarkable. Vascular: No asymmetric hyperdense vasculature at the skull base. Skull: The calvarium is intact. Sinuses/Orbits: Minimal mucosal thickening in dependently layering mucus or fluid within the left sphenoid sinus. Remaining paranasal sinuses are clear. The orbits are unremarkable. Other: Mastoid air cells and middle ear cavities are clear IMPRESSION: Multiple remote infarcts. No evidence of acute intracranial hemorrhage or infarct. Electronically Signed   By: Fidela Salisbury MD   On: 08/29/2020 23:27   MR ANGIO HEAD WO CONTRAST  Result Date: 08/31/2020 CLINICAL DATA:  Possible stroke EXAM: MRA HEAD WITHOUT CONTRAST TECHNIQUE: Angiographic images of the Circle of Willis were obtained using MRA technique without intravenous contrast. COMPARISON:  None. FINDINGS: POSTERIOR CIRCULATION: --Vertebral arteries: Normal V4 segments. --Inferior cerebellar arteries: Normal. --Basilar artery: Normal. --Superior cerebellar arteries: Normal. --Posterior cerebral arteries: Normal. ANTERIOR CIRCULATION: --Intracranial internal carotid arteries: Normal. --Anterior cerebral arteries (ACA): Normal. Both A1 segments are present. Patent anterior communicating artery (a-comm). --Middle cerebral arteries (MCA): Normal. IMPRESSION: Normal intracranial MRA. Electronically Signed   By: Ulyses Jarred M.D.   On: 08/31/2020 20:06   MR BRAIN WO CONTRAST  Result Date: 08/31/2020 CLINICAL DATA:  Delirium EXAM: MRI HEAD WITHOUT CONTRAST TECHNIQUE: Multiplanar, multiecho pulse sequences of the brain and surrounding structures were obtained without intravenous contrast. COMPARISON:  08/29/2020 head CT and prior. 07/29/2019 MRA head, 07/27/2019 MRI head and prior. FINDINGS: Brain: Focal restricted diffusion involving the dorsal left frontal, insula, parietal and occipitotemporal regions. No intracranial hemorrhage. No midline  shift, ventriculomegaly or extra-axial fluid collection. Advanced chronic microvascular ischemic changes. No mass lesion. Sequela of remote right centrum semiovale, right cerebellar and bilateral basal ganglia insults. Mild diffuse cerebral atrophy with ex vacuo dilatation. Vascular: T2/FLAIR hyperintense signal involving the distal left transverse and sigmoid sinuses (10:4). Major intracranial flow voids are otherwise preserved. Skull and upper cervical spine: Normal marrow signal. Sinuses/Orbits: Normal orbits. Clear paranasal sinuses. No mastoid effusion. Other: None. IMPRESSION: Acute infarcts involving the left frontoparietal, insular and occipitotemporal regions. FLAIR hyperintense signal within the left transverse/sigmoid sinus. While this may reflect slow flow consider MRA/MRV imaging to exclude venous sinus thrombosis. Remote right centrum semiovale, right cerebellar and bilateral basal ganglia insults. Mild cerebral atrophy.  Chronic microvascular ischemic changes. These results were called by telephone at the time of interpretation on 08/31/2020 at 10:12 am to provider [facility] , who verbally acknowledged these results. Electronically Signed   By: Primitivo Gauze M.D.   On: 08/31/2020 10:18   DG Pelvis Portable  Result Date: 08/31/2020 CLINICAL DATA:  Pain.  Question dislocation. 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 Previous gamma nail fixation of the left femur with solid union. No acute or significant focal pelvic finding. IMPRESSION: No acute finding. 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 Electronically Signed  By: Nelson Chimes M.D.   On: 08/31/2020 12:58   ECHOCARDIOGRAM COMPLETE  Result Date: 09/03/2020    ECHOCARDIOGRAM REPORT   Patient Name:   PECOLIA MARANDO [facility] Date of Exam: 09/03/2020 Medical Rec #:  [id number]          Height:       66.0 in Accession #:    [id number]         Weight:        105.8 lb Date of Birth:  [birth year]          BSA:          1.526 m Patient Age:    54 years           BP:           152/69 mmHg Patient Gender: F                  HR:           76 bpm. Exam Location:  Inpatient Procedure: 2D Echo, Cardiac Doppler and Color Doppler Indications:    Stroke 434.91 / I163.9  History:        Patient has prior history of Echocardiogram examinations, most                 recent 09/01/2020. Stroke; Risk Factors:Hypertension and                 Dyslipidemia. Altered mental status, Drug overdose, PE                 (pulmonary embolism) (From Hx).  Sonographer:    Alvino Chapel RCS Referring Phys: [id number] [place]  1. Left ventricular ejection fraction, by estimation, is 60 to 65%. The left ventricle has normal function. Left ventricular endocardial border not optimally defined to evaluate regional wall motion. There is not well visualized left ventricular hypertrophy. Left ventricular diastolic parameters are indeterminate.  2. Right ventricular systolic function was not well visualized. The right ventricular size is not well visualized. There is normal pulmonary artery systolic pressure.  3. The mitral valve is abnormal. not well visualized mitral valve regurgitation.  4. Tricuspid valve regurgitation not well visualized.  5. The aortic valve was not well visualized. Aortic valve regurgitation is not visualized.  6. Pulmonic valve regurgitation not well visualized. Conclusion(s)/Recommendation(s): Very difficult windows, even with use of echo contrast. Nearly no parasternal windows, and very limited apical pictures. Grossly normal LVEF, though reduced sensitivity for focal wall motion abnormalities. Mitral valve appears calcified/thickened. Given two attempts with TTE, recommend alternative assessment for further evaluation, such as TEE. FINDINGS  Left Ventricle: Left ventricular ejection fraction, by estimation, is 60 to 65%. The left ventricle has normal function. Left  ventricular endocardial border not optimally defined to evaluate regional wall motion. Definity contrast agent was given IV to delineate the left ventricular endocardial borders. The left ventricular internal cavity size was normal in size. There is not well visualized left ventricular hypertrophy. Left ventricular diastolic parameters are indeterminate. Right Ventricle: The right ventricular size is not well visualized. Right vetricular wall thickness was not assessed. Right ventricular systolic function was not well visualized. There is normal pulmonary artery systolic pressure. The tricuspid regurgitant velocity is 2.32 m/s, and with an assumed right atrial pressure of 8 mmHg, the estimated right ventricular systolic pressure is 71.0 mmHg. Left Atrium: Left atrial size was not well visualized. Right Atrium: Right atrial size was not well visualized. Pericardium: The pericardium was not  well visualized. Mitral Valve: The mitral valve is abnormal. Not well visualized mitral valve regurgitation. MV peak gradient, 7.5 mmHg. The mean mitral valve gradient is 2.0 mmHg. Tricuspid Valve: The tricuspid valve is not well visualized. Tricuspid valve regurgitation not well visualized. Aortic Valve: The aortic valve was not well visualized. Aortic valve regurgitation is not visualized. Pulmonic Valve: The pulmonic valve was not well visualized. Pulmonic valve regurgitation not well visualized. Aorta: The aortic root was not well visualized, the ascending aorta was not well visualized and the aortic arch was not well visualized. Venous: The inferior vena cava was not well visualized. IAS/Shunts: The interatrial septum was not well visualized.   Diastology LV e' lateral:   7.62 cm/s LV E/e' lateral: 8.0 LV e' medial:    6.09 cm/s LV E/e' medial:  10.0  RIGHT VENTRICLE RV S prime:     12.60 cm/s TAPSE (M-mode): 1.7 cm LEFT ATRIUM           Index       RIGHT ATRIUM           Index LA Vol (A4C): 40.7 ml 26.67 ml/m RA Area:      14.90 cm                                   RA Volume:   39.60 ml  25.95 ml/m  AORTIC VALVE LVOT Vmax:   95.80 cm/s LVOT Vmean:  64.300 cm/s LVOT VTI:    0.150 m MITRAL VALVE                TRICUSPID VALVE MV Area (PHT): 2.65 cm     TR Peak grad:   21.5 mmHg MV Peak grad:  7.5 mmHg     TR Vmax:        232.00 cm/s MV Mean grad:  2.0 mmHg MV Vmax:       1.37 m/s     SHUNTS MV Vmean:      51.7 cm/s    Systemic VTI: 0.15 m MV Decel Time: 286 msec MV E velocity: 60.70 cm/s MV A velocity: 133.00 cm/s MV E/A ratio:  0.46 Buford Dresser MD Electronically signed by Buford Dresser MD Signature Date/Time: 09/03/2020/3:34:35 PM    Final    ECHOCARDIOGRAM COMPLETE  Result Date: 09/01/2020    ECHOCARDIOGRAM REPORT   Patient Name:   Estill Batten Date of Exam: 09/01/2020 Medical Rec #:  [id number]          Height:       66.0 in Accession #:    [id number]         Weight:       105.8 lb Date of Birth:  04-20-1932          BSA:          1.526 m Patient Age:    35 years           BP:           125/68 mmHg Patient Gender: F                  HR:           83 bpm. Exam Location:  Inpatient Procedure: 2D Echo, Cardiac Doppler and Color Doppler Indications:    CVA  History:        Patient has prior history of Echocardiogram examinations, most  recent 07/28/2019. Stroke, Signs/Symptoms:Altered Mental Status                 and Pneumonia; Risk Factors:Pulmonary emboli, Hypertension and                 Former Smoker. Chronic Opiod use.  Sonographer:    Dustin Flock Referring Phys: [id number] [facility] AMIN  Sonographer Comments: No parasternal window, no apical window and Technically difficult study due to poor echo windows. Image acquisition challenging due to respiratory motion and Image acquisition challenging due to breast implants.  Very limited images. The study is not interpretable. Consider TEE if clinically appropriate. Sanda Klein MD Electronically signed by Sanda Klein MD Signature  Date/Time: 09/01/2020/4:48:01 PM    Final    DG HIP UNILAT WITH PELVIS 2-3 VIEWS RIGHT  Result Date: 08/31/2020 CLINICAL DATA:  Right hip pain.  Question dislocation. EXAM: DG HIP (WITH OR WITHOUT PELVIS) 2-3V RIGHT COMPARISON:  05/12/2020. FINDINGS: Previous bipolar hip replacement on the right. No complicating feature seen. No dislocation. IMPRESSION: Good appearance of the previous bipolar hip replacement on the right. Electronically Signed   By: Nelson Chimes M.D.   On: 08/31/2020 12:59   VAS US CAROTID  Result Date: 09/01/2020 Carotid Arterial Duplex Study Indications:       CVA. Risk Factors:      Hypertension. Comparison Study:  07/27/19 previous Performing Technologist: Abram Sander RVS  Examination Guidelines: A complete evaluation includes B-mode imaging, spectral Doppler, color Doppler, and power Doppler as needed of all accessible portions of each vessel. Bilateral testing is considered an integral part of a complete examination. Limited examinations for reoccurring indications may be performed as noted.  Right Carotid Findings: +----------+--------+--------+--------+------------------+--------+           PSV cm/sEDV cm/sStenosisPlaque DescriptionComments +----------+--------+--------+--------+------------------+--------+ CCA Prox  91      14              heterogenous               +----------+--------+--------+--------+------------------+--------+ CCA Distal65      12              heterogenous               +----------+--------+--------+--------+------------------+--------+ ICA Prox  85      16      1-39%   heterogenous               +----------+--------+--------+--------+------------------+--------+ ICA Distal67      13                                         +----------+--------+--------+--------+------------------+--------+ ECA       95                                                 +----------+--------+--------+--------+------------------+--------+  +----------+--------+-------+--------+-------------------+           PSV cm/sEDV cmsDescribeArm Pressure (mmHG) +----------+--------+-------+--------+-------------------+ [id number]                                        +----------+--------+-------+--------+-------------------+ +---------+--------+--+--------+--+---------+ VertebralPSV cm/s39EDV cm/s11Antegrade +---------+--------+--+--------+--+---------+  Left Carotid Findings: +----------+--------+--------+--------+------------------+--------+  PSV cm/sEDV cm/sStenosisPlaque DescriptionComments +----------+--------+--------+--------+------------------+--------+ CCA Prox  88      17              heterogenous               +----------+--------+--------+--------+------------------+--------+ CCA Distal71      14              heterogenous               +----------+--------+--------+--------+------------------+--------+ ICA Prox  79      18      1-39%   heterogenous               +----------+--------+--------+--------+------------------+--------+ ICA Distal57      22                                         +----------+--------+--------+--------+------------------+--------+ ECA       82                                                 +----------+--------+--------+--------+------------------+--------+ +----------+--------+--------+--------+-------------------+           PSV cm/sEDV cm/sDescribeArm Pressure (mmHG) +----------+--------+--------+--------+-------------------+ [id number]                                         +----------+--------+--------+--------+-------------------+ +---------+--------+--+--------+--+---------+ VertebralPSV cm/s74EDV cm/s15Antegrade +---------+--------+--+--------+--+---------+   Summary: Right Carotid: Velocities in the right ICA are consistent with a 1-39% stenosis. Left Carotid: Velocities in the left ICA are consistent with a 1-39% stenosis.  Vertebrals: Bilateral vertebral arteries demonstrate antegrade flow. *See table(s) above for measurements and observations.     Preliminary    VAS [place] LOWER EXTREMITY VENOUS (DVT)  Result Date: 09/03/2020  Lower Venous DVT Study Indications: Stroke.  Limitations: Body habitus and patient positioning. Comparison Study: Prior study from 08/31/2019 is available for comparison Performing Technologist: Sharion Dove RVS  Examination Guidelines: A complete evaluation includes B-mode imaging, spectral Doppler, color Doppler, and power Doppler as needed of all accessible portions of each vessel. Bilateral testing is considered an integral part of a complete examination. Limited examinations for reoccurring indications may be performed as noted. The reflux portion of the exam is performed with the patient in reverse Trendelenburg.  +---------+---------------+---------+-----------+----------+-------------------+ RIGHT    CompressibilityPhasicitySpontaneityPropertiesThrombus Aging      +---------+---------------+---------+-----------+----------+-------------------+ CFV                                                   Not visualized      +---------+---------------+---------+-----------+----------+-------------------+ SFJ                                                   Not visualized      +---------+---------------+---------+-----------+----------+-------------------+ FV Prox                 Yes      Yes  patent by color and                                                       Doppler             +---------+---------------+---------+-----------+----------+-------------------+ FV Mid                                                patent by color and                                                       Doppler             +---------+---------------+---------+-----------+----------+-------------------+ FV Distal                                              patent by color and                                                       Doppler             +---------+---------------+---------+-----------+----------+-------------------+ PFV                                                   Not visualized      +---------+---------------+---------+-----------+----------+-------------------+ POP      Full           Yes      Yes                                      +---------+---------------+---------+-----------+----------+-------------------+ PTV      Full                                                             +---------+---------------+---------+-----------+----------+-------------------+ PERO     Full                                                             +---------+---------------+---------+-----------+----------+-------------------+   +---------+---------------+---------+-----------+----------+-------------------+ LEFT     CompressibilityPhasicitySpontaneityPropertiesThrombus Aging      +---------+---------------+---------+-----------+----------+-------------------+ CFV      Full  Yes      Yes                                      +---------+---------------+---------+-----------+----------+-------------------+ SFJ      Full                                                             +---------+---------------+---------+-----------+----------+-------------------+ FV Prox  Full                                                             +---------+---------------+---------+-----------+----------+-------------------+ FV Mid   Full                                                             +---------+---------------+---------+-----------+----------+-------------------+ FV DistalFull                                                             +---------+---------------+---------+-----------+----------+-------------------+ PFV      Full                                                              +---------+---------------+---------+-----------+----------+-------------------+ POP                                                   patent by color and                                                       Doppler             +---------+---------------+---------+-----------+----------+-------------------+ PTV      Full                                                             +---------+---------------+---------+-----------+----------+-------------------+ PERO     Full                                                             +---------+---------------+---------+-----------+----------+-------------------+  Summary: RIGHT: - Findings appear essentially unchanged compared to previous examination. - There is no evidence of deep vein thrombosis in the lower extremity. However, portions of this examination were limited- see technologist comments above.  LEFT: - Findings appear essentially unchanged compared to previous examination. - There is no evidence of deep vein thrombosis in the lower extremity. However, portions of this examination were limited- see technologist comments above.  *See table(s) above for measurements and observations.    Preliminary        Subjective: Patient seen and examined at the bedside this morning.  Hemodynamically stable for discharge.  Discharge Exam: Vitals:   09/05/20 0339 09/05/20 0829  BP: (!) 140/94 (!) 113/53  Pulse: 81 79  Resp: 17 20  Temp: 98.1 F (36.7 C) 97.7 F (36.5 C)  SpO2: 97% 97%   Vitals:   09/04/20 2000 09/04/20 2338 09/05/20 0339 09/05/20 0829  BP:  119/66 (!) 140/94 (!) 113/53  Pulse:  84 81 79  Resp: 19 18 17 20   Temp:  97.6 F (36.4 C) 98.1 F (36.7 C) 97.7 F (36.5 C)  TempSrc:  Oral Oral Oral  SpO2:  98% 97% 97%  Height:        General: Pt is alert, awake, not in acute distress Cardiovascular: RRR, S1/S2 +, no rubs, no gallops Respiratory: CTA bilaterally, no wheezing, no  rhonchi Abdominal: Soft, NT, ND, bowel sounds + Extremities: no edema, no cyanosis    The results of significant diagnostics from this hospitalization (including imaging, microbiology, ancillary and laboratory) are listed below for reference.     Microbiology: Recent Results (from the past 240 hour(s))  SARS Coronavirus 2 by RT PCR (hospital order, performed in [facility] hospital lab) Nasopharyngeal Nasopharyngeal Swab     Status: None   Collection Time: 08/29/20 11:00 PM   Specimen: Nasopharyngeal Swab  Result Value Ref Range Status   SARS Coronavirus 2 NEGATIVE NEGATIVE Final    Comment: (NOTE) SARS-CoV-2 target nucleic acids are NOT DETECTED.  The SARS-CoV-2 RNA is generally detectable in upper and lower respiratory specimens during the acute phase of infection. The lowest concentration of SARS-CoV-2 viral copies this assay can detect is 250 copies / mL. A negative result does not preclude SARS-CoV-2 infection and should not be used as the sole basis for treatment or other patient management decisions.  A negative result may occur with improper specimen collection / handling, submission of specimen other than nasopharyngeal swab, presence of viral mutation(s) within the areas targeted by this assay, and inadequate number of viral copies (<250 copies / mL). A negative result must be combined with clinical observations, patient history, and epidemiological information.  Fact Sheet for Patients:   StrictlyIdeas.no  Fact Sheet for Healthcare Providers: BankingDealers.co.za  This test is not yet approved or  cleared by the [place] FDA and has been authorized for detection and/or diagnosis of SARS-CoV-2 by FDA under an Emergency Use Authorization (EUA).  This EUA will remain in effect (meaning this test can be used) for the duration of the COVID-19 declaration under Section 564(b)(1) of the Act, 21 U.S.C. section  360bbb-3(b)(1), unless the authorization is terminated or revoked sooner.  Performed at [facility], [place] [address] [place], [place] [address]      Labs: BNP (last 3 results) No results for input(s): BNP in the last 8760 hours. Basic Metabolic Panel: Recent Labs  Lab 09/01/20 1439 09/02/20 0256 09/03/20 0400 09/04/20 0325 09/05/20 0735  NA 136 137 137  134* 137  K 4.0 3.7 4.1 4.0 4.1  CL 104 104 106 99 101  CO2 23 26 24 26 28   GLUCOSE 121* 148* 109* 106* 110*  BUN 19 16 15 12 21   CREATININE 0.77 0.72 0.63 0.79 0.75  CALCIUM 9.5 9.4 9.0 9.5 9.6  MG 1.6* 1.5* 2.0 1.7 1.7   Liver Function Tests: Recent Labs  Lab 08/29/20 2300  AST 25  ALT 15  ALKPHOS 103  BILITOT 0.9  PROT 6.7  ALBUMIN 3.8   No results for input(s): LIPASE, AMYLASE in the last 168 hours. Recent Labs  Lab 08/29/20 2300  AMMONIA 22   CBC: Recent Labs  Lab 08/29/20 2300 08/31/20 0256 09/01/20 1439 09/02/20 0256 09/03/20 0400 09/04/20 0325 09/05/20 0735  WBC 9.5   < > 10.2 8.0 7.5 9.8 9.2  NEUTROABS 5.6  --   --   --   --   --   --   HGB 14.9   < > 12.0 12.3 11.1* 12.9 11.8*  HCT 46.0   < > 37.7 37.4 35.0* 39.8 37.4  MCV 87.8   < > 86.9 87.2 86.8 87.5 86.8  PLT 473*   < > 367 350 281 330 284   < > = values in this interval not displayed.   Cardiac Enzymes: Recent Labs  Lab 08/29/20 2300  CKTOTAL 29*   BNP: Invalid input(s): POCBNP CBG: Recent Labs  Lab 08/30/20 0724  GLUCAP 117*   D-Dimer No results for input(s): DDIMER in the last 72 hours. Hgb A1c No results for input(s): HGBA1C in the last 72 hours. Lipid Profile No results for input(s): CHOL, HDL, LDLCALC, TRIG, CHOLHDL, LDLDIRECT in the last 72 hours. Thyroid function studies No results for input(s): TSH, T4TOTAL, T3FREE, THYROIDAB in the last 72 hours.  Invalid input(s): FREET3 Anemia work up No results for input(s): VITAMINB12, FOLATE, FERRITIN, TIBC, IRON, RETICCTPCT in the last 72 hours. Urinalysis     Component Value Date/Time   COLORURINE YELLOW 08/30/2020 0204   APPEARANCEUR CLEAR 08/30/2020 0204   LABSPEC 1.020 08/30/2020 0204   PHURINE 6.0 08/30/2020 0204   GLUCOSEU NEGATIVE 08/30/2020 0204   HGBUR NEGATIVE 08/30/2020 0204   HGBUR negative 04/24/2009 1635   BILIRUBINUR NEGATIVE 08/30/2020 0204   BILIRUBINUR negative 05/29/2011 1439   KETONESUR NEGATIVE 08/30/2020 0204   PROTEINUR NEGATIVE 08/30/2020 0204   UROBILINOGEN 0.2 01/26/2014 1912   NITRITE NEGATIVE 08/30/2020 0204   LEUKOCYTESUR NEGATIVE 08/30/2020 0204   Sepsis Labs Invalid input(s): PROCALCITONIN,  WBC,  LACTICIDVEN Microbiology Recent Results (from the past 240 hour(s))  SARS Coronavirus 2 by RT PCR (hospital order, performed in [place] hospital lab) Nasopharyngeal Nasopharyngeal Swab     Status: None   Collection Time: 08/29/20 11:00 PM   Specimen: Nasopharyngeal Swab  Result Value Ref Range Status   SARS Coronavirus 2 NEGATIVE NEGATIVE Final    Comment: (NOTE) SARS-CoV-2 target nucleic acids are NOT DETECTED.  The SARS-CoV-2 RNA is generally detectable in upper and lower respiratory specimens during the acute phase of infection. The lowest concentration of SARS-CoV-2 viral copies this assay can detect is 250 copies / mL. A negative result does not preclude SARS-CoV-2 infection and should not be used as the sole basis for treatment or other patient management decisions.  A negative result may occur with improper specimen collection / handling, submission of specimen other than nasopharyngeal swab, presence of viral mutation(s) within the areas targeted by this assay, and inadequate number of viral copies (<250  copies / mL). A negative result must be combined with clinical observations, patient history, and epidemiological information.  Fact Sheet for Patients:   StrictlyIdeas.no  Fact Sheet for Healthcare Providers: BankingDealers.co.za  This test is  not yet approved or  cleared by the [place] FDA and has been authorized for detection and/or diagnosis of SARS-CoV-2 by FDA under an Emergency Use Authorization (EUA).  This EUA will remain in effect (meaning this test can be used) for the duration of the COVID-19 declaration under Section 564(b)(1) of the Act, 21 U.S.C. section 360bbb-3(b)(1), unless the authorization is terminated or revoked sooner.  Performed at [facility], [place] [address] [place], [place] [address]     Please note: You were cared for by a hospitalist during your hospital stay. Once you are discharged, your primary care physician will handle any further medical issues. Please note that NO REFILLS for any discharge medications will be authorized once you are discharged, as it is imperative that you return to your primary care physician (or establish a relationship with a primary care physician if you do not have one) for your post hospital discharge needs so that they can reassess your need for medications and monitor your lab values.    Time coordinating discharge: 40 minutes  SIGNED:   Shelly Coss, MD  [facility] 09/05/2020, 10:27 AM Pager [phone]  If 7PM-7AM, please contact night-coverage [email or web address] Password [email or web address]

## 2020-09-05 NOTE — Progress Notes (Addendum)
Occupational Therapy Treatment Patient Details Name: Angelica Bell MRN: [id number] DOB: 1932-10-25 Today's Date: 09/05/2020    History of present illness Pt is a 86 yo female who presents to the hospital with a R facial droop, dysarthria and R sided weakness. Pt was noted to have a L frontoparietal, insular and occipitotemporal infarct. Pt has a PMH of Pneumonia, PE, HTN, HOH and Pulmonary HTN   OT comments  Patient supine in bed and agreeable to engage with OT.  Patient offered washcloth to wipe mouth upon entry (as patient rubbing lips) and requires min assist to wash R side of mouth.  Patient voices pain in B LEs asking to be repositioned in bed; elevated HOB for increased comfort. She declined EOB or OOB activity today. PROM to R UE, flaccid UE.  Patient reporting feeling wet, assisted with hygiene and changing pads total assist; mod assist to roll given cueing for technique and reaching for bed rail with L UE.  RN notified of depressed mood, even reporting during session "I'm dying"; noted palliative following, but would benefit from chaplin services.  Therapist assisted to make patient comfortable today.  Will follow acutely.     Follow Up Recommendations  SNF;Supervision/Assistance - 24 hour    Equipment Recommendations  Hospital bed;Other (comment) (hoyer lift )    Recommendations for Other Services      Precautions / Restrictions Precautions Precautions: Fall Precaution Comments: R sided weakness Restrictions Weight Bearing Restrictions: No       Mobility Bed Mobility Overal bed mobility: Needs Assistance Bed Mobility: Rolling Rolling: Mod assist         General bed mobility comments: rolling towards R side with mod assist with cueing to initate reach towards rail on R side and technique   Transfers                      Balance Overall balance assessment:  (pt declined)                                         ADL either performed or  assessed with clinical judgement   ADL Overall ADL's : Needs assistance/impaired     Grooming: Wash/dry face;Minimal assistance;Bed level Grooming Details (indicate cue type and reason): using L UE                      Toileting- Clothing Manipulation and Hygiene: Total assistance;Bed level Toileting - Clothing Manipulation Details (indicate cue type and reason): total assist for hygiene after soiled with urine              Vision       Perception     Praxis      Cognition Arousal/Alertness: Awake/alert Behavior During Therapy: Flat affect Overall Cognitive Status: Difficult to assess Area of Impairment: Following commands;Awareness;Problem solving                       Following Commands: Follows one step commands inconsistently;Follows one step commands with increased time   Awareness: Emergent Problem Solving: Slow processing;Requires verbal cues;Decreased initiation;Difficulty sequencing General Comments: patient with aphasia, able to eventually commuincate needs with verbal or gestures; follows simple commands inconsistently.         Exercises     Shoulder Instructions       General Comments pt appears depressed, even voiced  clearly once "I'm dying"; RN notified and would benefit from chaplin services, noted palliative care following     Pertinent Vitals/ Pain       Pain Assessment: Faces Faces Pain Scale: Hurts little more Pain Location: legs  Pain Descriptors / Indicators: Grimacing Pain Intervention(s): Repositioned  Home Living                                          Prior Functioning/Environment              Frequency  Min 2X/week        Progress Toward Goals  OT Goals(current goals can now be found in the care plan section)  Progress towards OT goals: Progressing toward goals (slowly)  Acute Rehab OT Goals Patient Stated Goal: unable to state, difficulty communicating OT Goal Formulation:  Patient unable to participate in goal setting  Plan Discharge plan remains appropriate;Frequency remains appropriate    Co-evaluation                 AM-PAC OT "6 Clicks" Daily Activity     Outcome Measure   Help from another person eating meals?: Total Help from another person taking care of personal grooming?: A Lot Help from another person toileting, which includes using toliet, bedpan, or urinal?: Total Help from another person bathing (including washing, rinsing, drying)?: Total Help from another person to put on and taking off regular upper body clothing?: Total Help from another person to put on and taking off regular lower body clothing?: Total 6 Click Score: 7    End of Session    OT Visit Diagnosis: Cognitive communication deficit (R41.841);Hemiplegia and hemiparesis Symptoms and signs involving cognitive functions: Cerebral infarction Hemiplegia - Right/Left: Right Hemiplegia - dominant/non-dominant: Dominant Hemiplegia - caused by: Cerebral infarction   Activity Tolerance Patient limited by lethargy;Patient limited by fatigue   Patient Left in bed;with call bell/phone within reach;with bed alarm set   Nurse Communication Mobility status        Time: 1610-9604 OT Time Calculation (min): 20 min  Charges: OT General Charges $OT Visit: 1 Visit OT Treatments $Self Care/Home Management : 8-22 mins  Jolaine Artist, OT Acute Rehabilitation Services Pager [phone] Office [place] 09/05/2020, 3:17 PM

## 2020-09-05 NOTE — Progress Notes (Signed)
Manufacturing engineer [facility])  Hospital Liaison RN note         Notified by [facility] manager of patient/family request for [facility] Palliative services at home after discharge.         Writer spoke with grandson [name] [facility])  to confirm interest and explain services.               [facility] Palliative team will follow up with patient after discharge.         Please call with any hospice or palliative related questions.         Thank you for the opportunity to participate in this patients care.     Chrislyn Edison Pace, BSN, RN Center (listed on [place] under Hospice[facility])    [phone]

## 2020-09-05 NOTE — TOC Transition Note (Signed)
Transition of Care [facility]) - CM/SW Discharge Note   Patient Details  Name: Angelica Bell MRN: [id number] Date of Birth: October 23, 1932  Transition of Care [facility]) CM/SW Contact:  Geralynn Ochs, LCSW Phone Number: 09/05/2020, 4:32 PM   Clinical Narrative:   Nurse to call report to [phone], Room 411B.  PTAR to pick up after COVID test results.    Final next level of care: Skilled Nursing Facility Barriers to Discharge: Barriers Resolved   Patient Goals and CMS Choice Patient states their goals for this hospitalization and ongoing recovery are:: patient unable to participate in goal setting due to disorientation CMS Medicare.gov Compare Post Acute Care list provided to:: Patient Represenative (must comment) Choice offered to / list presented to : [place] / [place]  Discharge Placement              Patient chooses bed at: [facility] Patient to be transferred to facility by: [place] Name of family member notified: [name] Patient and family notified of of transfer: 09/05/20  Discharge Plan and Services     Post Acute Care Choice: [place]                               Social Determinants of Health (SDOH) Interventions     Readmission Risk Interventions Readmission Risk Prevention Plan 05/15/2020  Transportation Screening Complete  PCP or Specialist Appt within 3-5 Days Complete  HRI or [place] Complete  Social Work Consult for [place] Planning/Counseling Complete  Palliative Care Screening Complete  Medication Review Press photographer) Complete  Some recent data might be hidden

## 2020-09-05 NOTE — Progress Notes (Addendum)
  Speech Language Pathology Treatment: Cognitive-Linquistic  Patient Details Name: Angelica Bell MRN: [id number] DOB: 22-Sep-1932 Today's Date: 09/05/2020 Time: 3383-2919 SLP Time Calculation (min) (ACUTE ONLY): 12 min  Assessment / Plan / Recommendation Clinical Impression  Pt continues to present with a non-fluent expressive aphasia.  Pt is able to use some compensations (e.g. pointing), but continues to be unable to consistently express her wants and needs.  Pt was able to indicate that she was in pain on her L side and would like medication with follow up questioning by SLP (RN notified).  Pt exhibited awareness of and frustration with communication deficits. Demonstrated use of call bell for pt.  Pt continues to decline to participate in structured therapy tasks/assessment.  Pt today was focused on feelings of abandonment with family.  She says on her grandson likes her, no one comes around anymore, that everyone hates her, and that she wants to die.  Pt may be experiencing symptoms of depression.  Consider consult from chaplain service, social work, and/or psychiatry.  Pt's rehab potential may be impacted by these symptoms.  SLP to follow for willingness to participate or indirect therapy opportunities.   HPI HPI: 86 year old with history of remote PE, chronic pain, HTN, possible mild dementia, presented with altered mental status with concerns of possible overdose.  A day prior to admission she was in her usual mentation but later on she was very confused therefore brought to the hospital.  Apparently she was given 90 pills of oxycodone about a month ago but within less than 10 days she had finished all of them. MRI shows Acute left frontoparietal, insular and occipital temporal region infarct.      SLP Plan  Continue with current plan of care       Recommendations   Consider assessment of mental health and/or visits from spiritual care/social work to discuss feelings around end of  life.                Follow up Recommendations:  (Continue ST at next level of care if pt agreeable) SLP Visit Diagnosis: Aphasia (R47.01) Plan: Continue with current plan of care       [place] , [place], [place] Office: [phone] 09/05/2020, 10:18 AM

## 2020-09-19 ENCOUNTER — Telehealth: Payer: Self-pay | Admitting: *Deleted

## 2020-09-19 NOTE — Telephone Encounter (Signed)
Patient enrolled for [employer] to ship a 30 day cardiac event monitor to: [facility] Attn: Nursing supervisor Nonie Hoyer [place], [address] [place], [place] [address] [phone]  Instructions briefly reviewed with Nonie Hoyer.  A instruction manual will be included in the monitor kit.

## 2020-09-25 ENCOUNTER — Ambulatory Visit (INDEPENDENT_AMBULATORY_CARE_PROVIDER_SITE_OTHER): Payer: Medicare Other

## 2020-09-25 DIAGNOSIS — I639 Cerebral infarction, unspecified: Secondary | ICD-10-CM | POA: Diagnosis not present

## 2020-09-25 DIAGNOSIS — I4891 Unspecified atrial fibrillation: Secondary | ICD-10-CM | POA: Diagnosis not present

## 2020-10-04 IMAGING — CT CT CERVICAL SPINE W/O CM
3 of 7 series · 13 of 33 positions shown, 15 images · non-contrast
Comparison: Head and cervical spine CT scan 08/31/2019.

CLINICAL DATA: The patient fell out of bed this morning at [DATE]
a.m. Initial encounter.

EXAM:
CT HEAD WITHOUT CONTRAST
CT CERVICAL SPINE WITHOUT CONTRAST
TECHNIQUE: Multidetector CT imaging of the head and cervical spine was
performed following the standard protocol without intravenous
contrast. Multiplanar CT image reconstructions of the cervical spine
were also generated.

[Series 9: c spine soft · axial · 0.27mm/px · z∈[-261,-135]mm · 4 of 107 slices shown]
[im 22/107  soft-tissue]
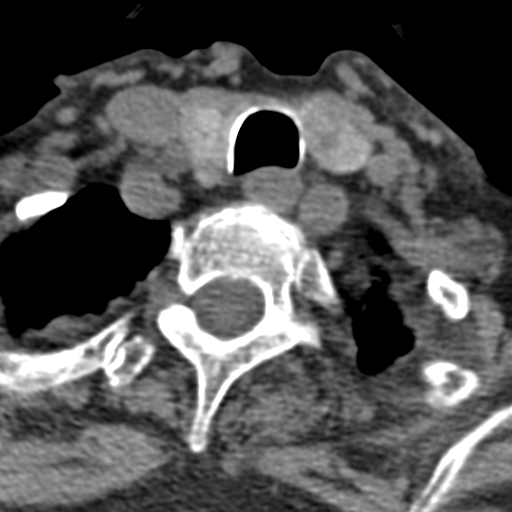
[im 43/107  soft-tissue]
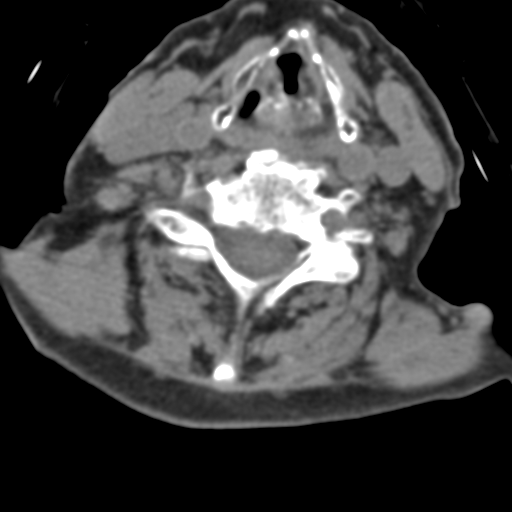
[im 64/107  soft-tissue]
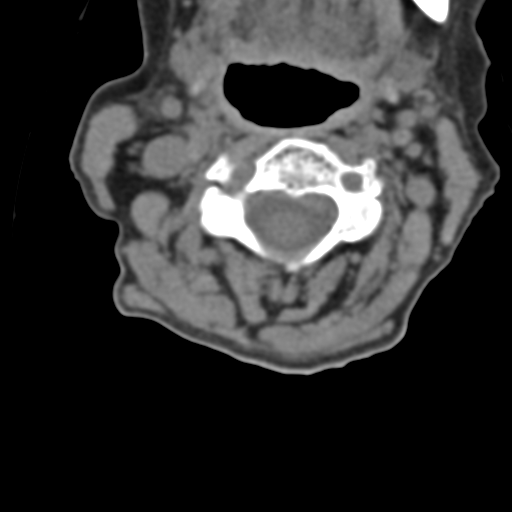
[im 85/107  soft-tissue]
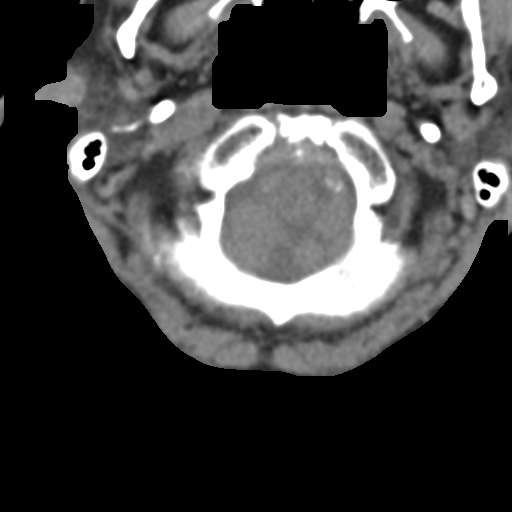

[Series 11: orthogonal bone · axial · 0.23mm/px · z∈[-277,-159]mm · 4 of 106 slices shown, 5 images]
[im 22/106  soft-tissue]
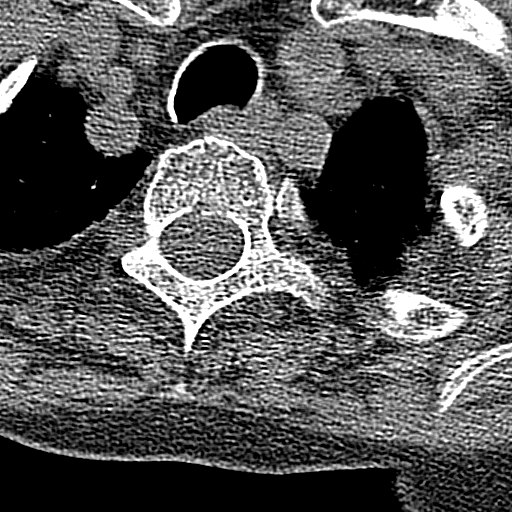
[im 22/106  bone]
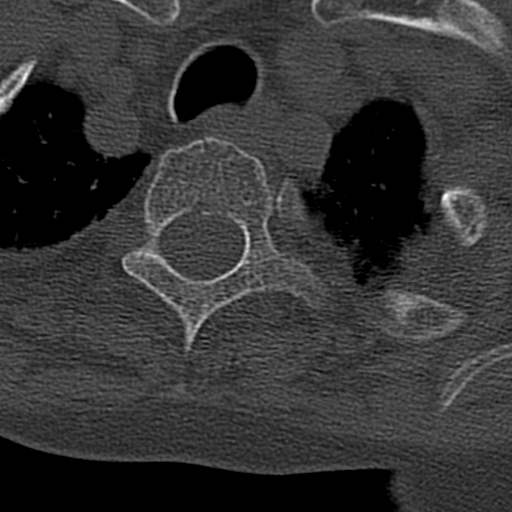
[im 43/106  bone]
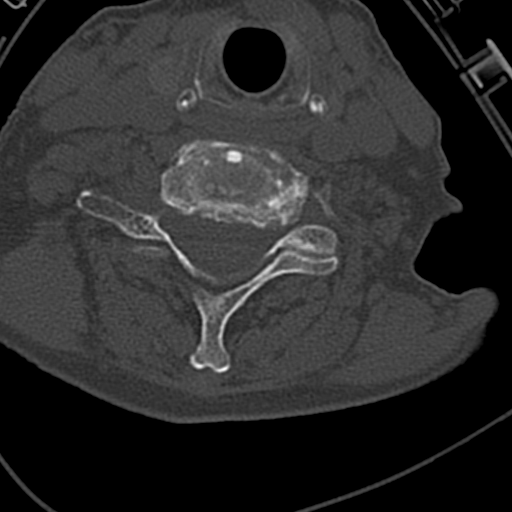
[im 64/106  bone]
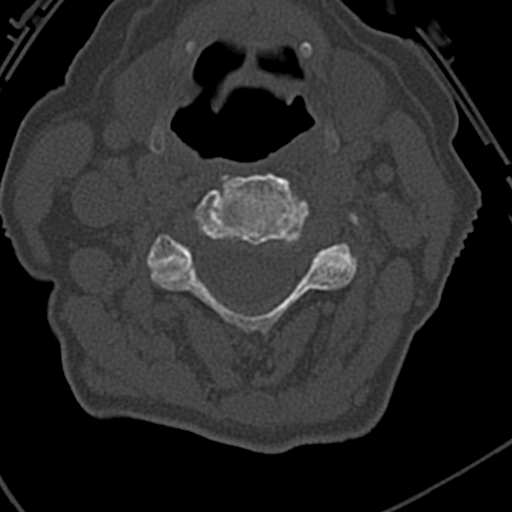
[im 85/106  bone]
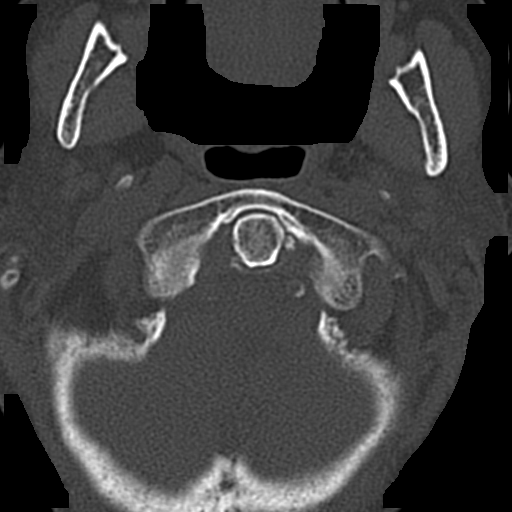

[Series 13: sagittal bone · sagittal · 0.31mm/px · 5 of 51 slices shown, 6 images]
[im 17/51  bone]
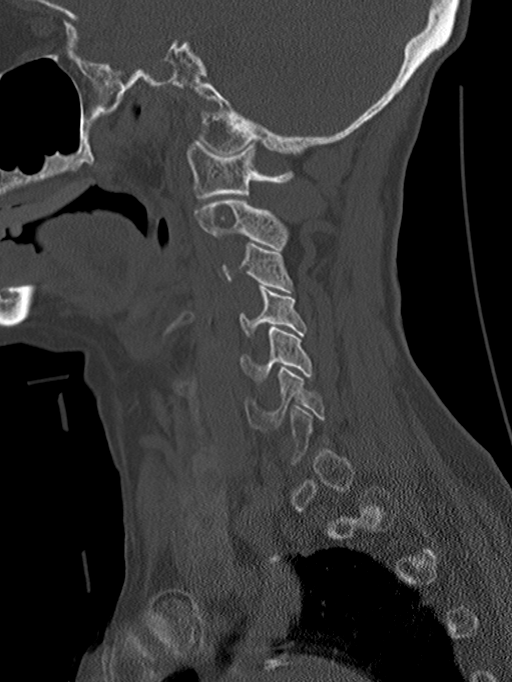
[im 21/51  bone]
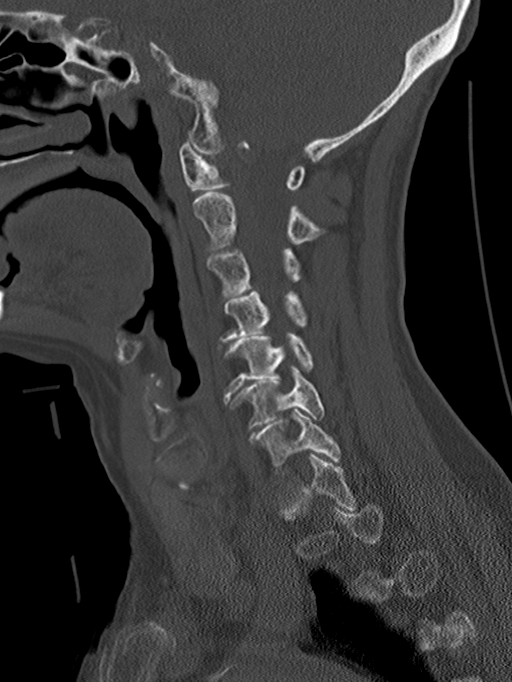
[im 26/51  soft-tissue]
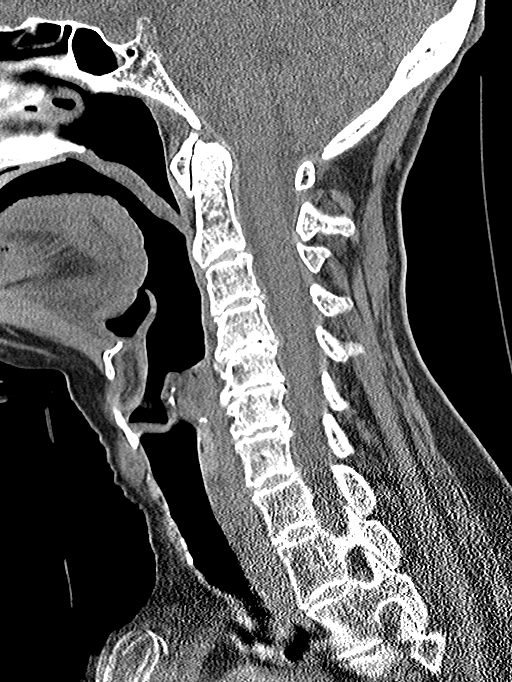
[im 26/51  bone]
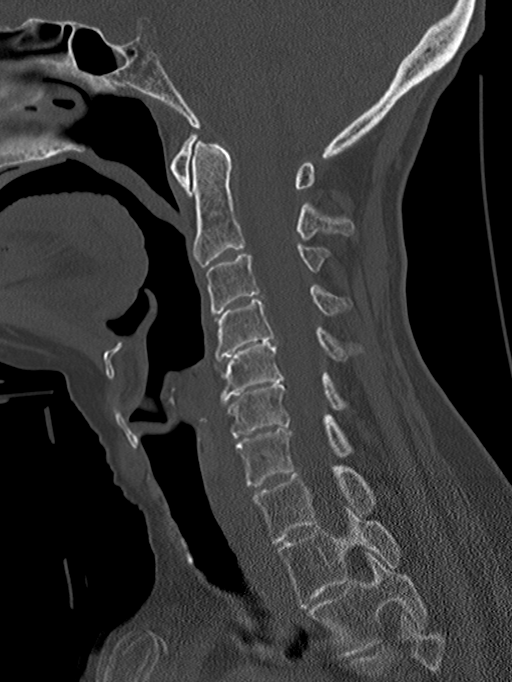
[im 30/51  bone]
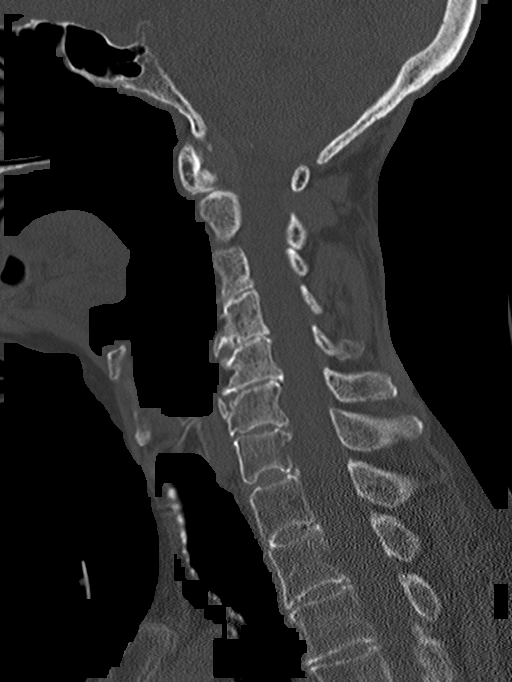
[im 34/51  bone]
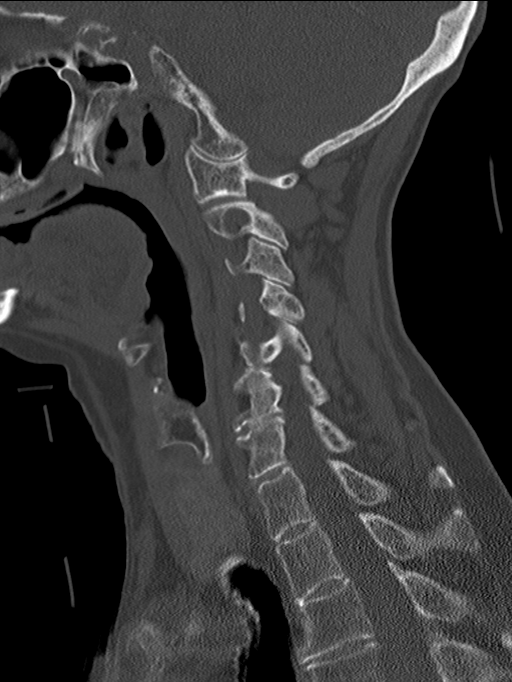

[13 of 33 positions shown; findings below may reference images not displayed]

FINDINGS: CT HEAD FINDINGS

Brain: No evidence of acute infarction, hemorrhage, hydrocephalus,
extra-axial collection or mass lesion/mass effect. Atrophy, chronic
microvascular ischemic change and remote lacunar infarctions in the
right cerebellum and right corona radiata noted.

Vascular: No hyperdense vessel or unexpected calcification.

Skull: Intact.  No focal lesion.

Sinuses/Orbits: Status post cataract surgery.  Otherwise negative.

Other: None.

CT CERVICAL SPINE FINDINGS

Alignment: Maintained with straightening of lordosis noted.

Skull base and vertebrae: No acute fracture. No primary bone lesion
or focal pathologic process.

Soft tissues and spinal canal: No prevertebral fluid or swelling. No
visible canal hematoma.

Disc levels:  Multilevel loss of disc space height noted.

Upper chest: Lung apices clear.

Other: None.
IMPRESSION: No acute abnormality head or cervical spine.

Atrophy, chronic microvascular ischemic change and remote lacunar
infarctions as described above.

Cervical spondylosis.

## 2020-10-04 IMAGING — CR DG HIP (WITH OR WITHOUT PELVIS) 3-4V BILAT
5 series · 5 of 5 positions shown · non-contrast
Comparison: Plain film of the pelvis 09/01/2019.

CLINICAL DATA: Left hip pain after a fall this morning. Bilateral
hip pain after a fall this morning. Initial encounter.

EXAM:
DG HIP (WITH OR WITHOUT PELVIS) 3-4V BILAT

[x pelvis]
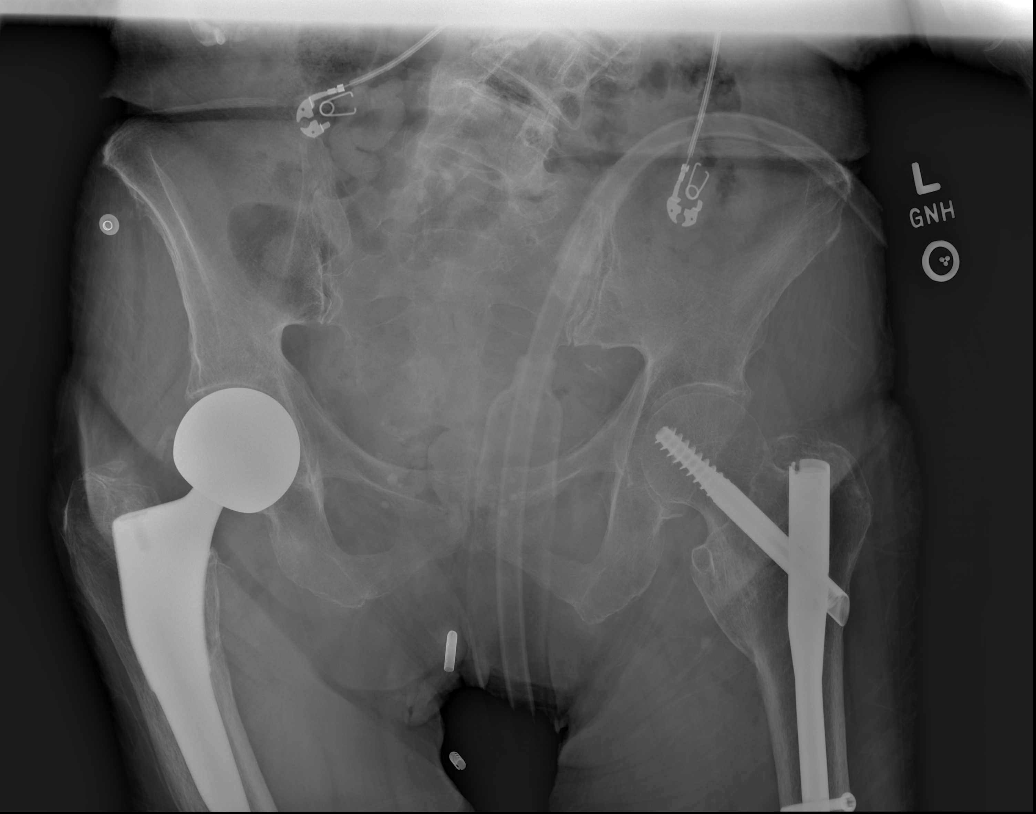

[x hip ap right]
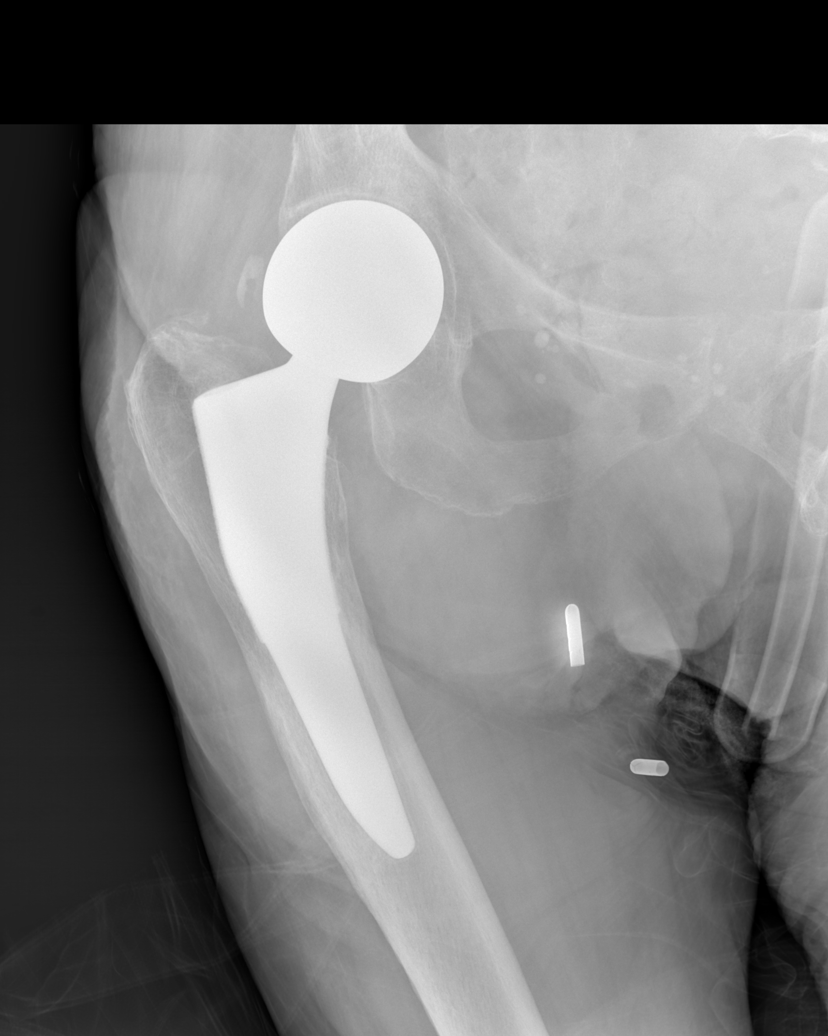

[x hip ap left]
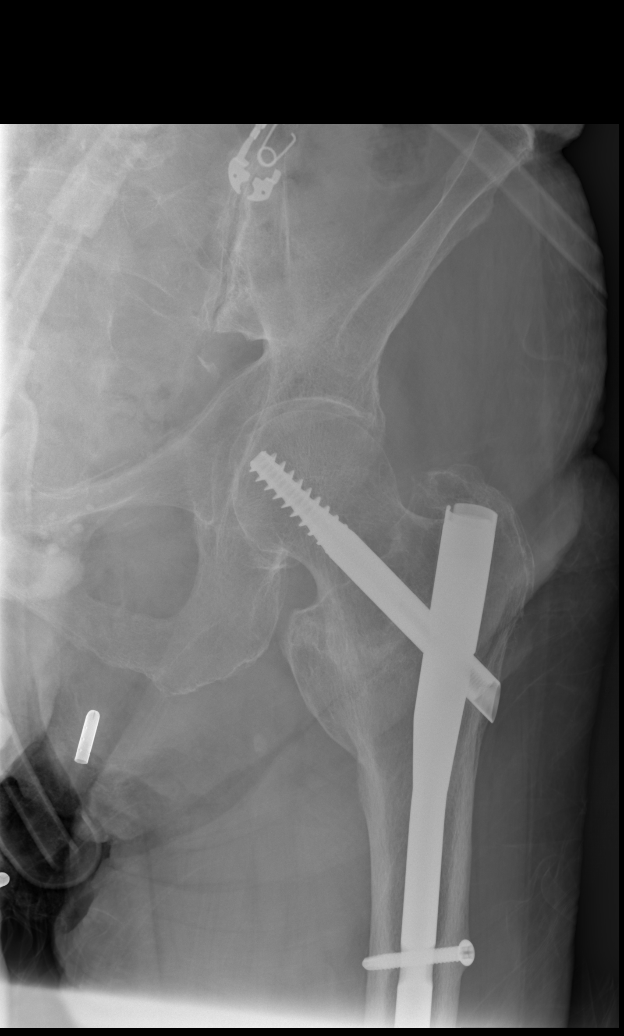

[x hip lat left]
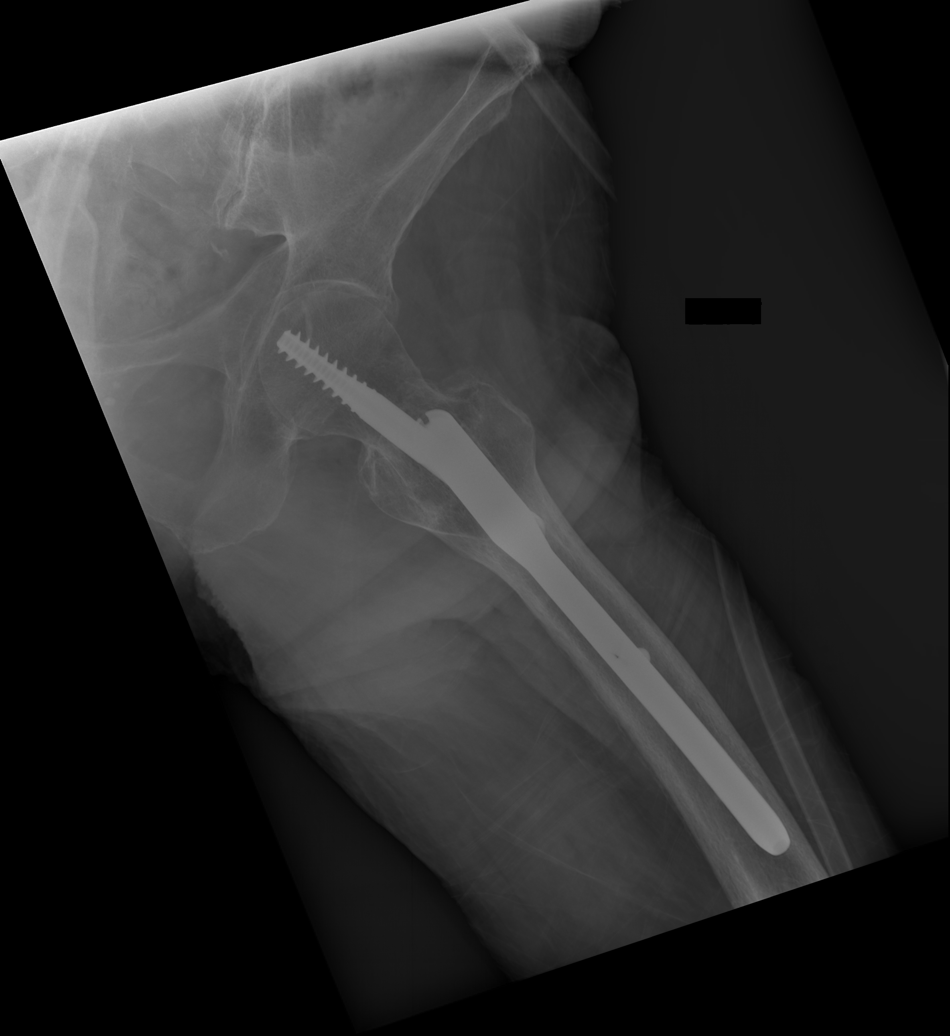

[w hip lat right]
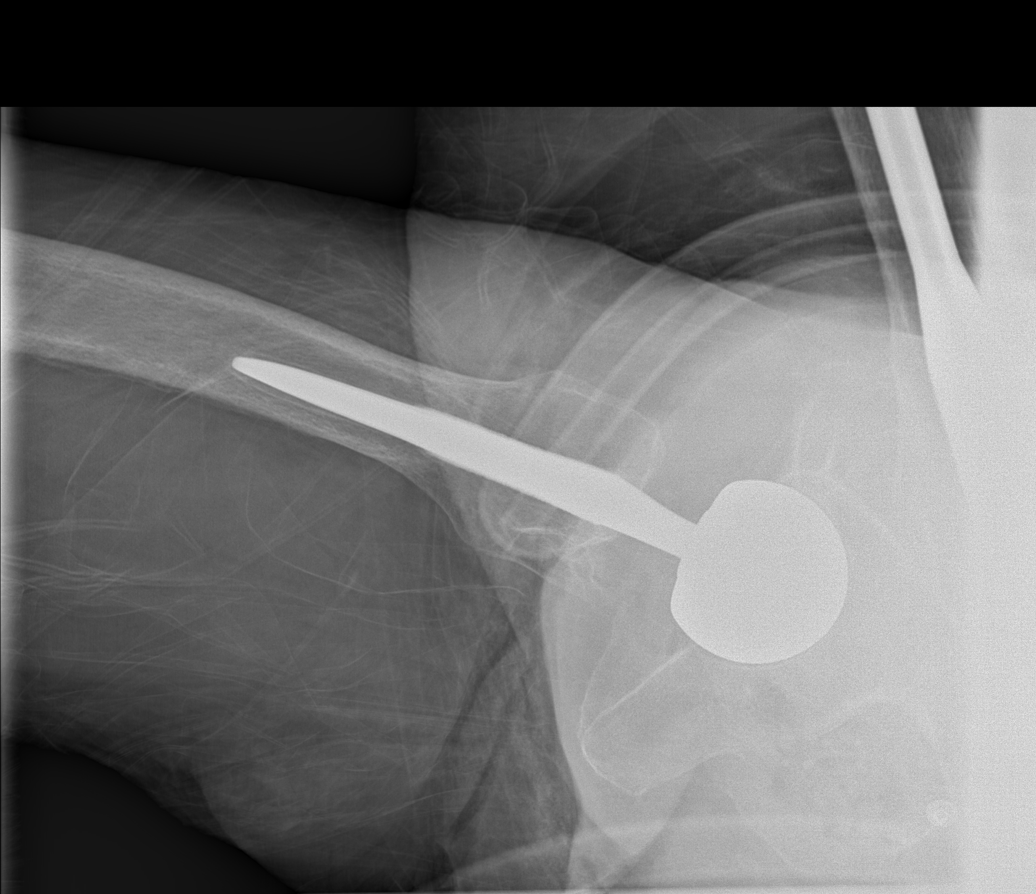

[5 of 5 positions shown; findings below may reference images not displayed]

FINDINGS: There is no acute bony or joint abnormality. The patient is status
post right hip replacement and fixation of a healed left
intertrochanteric fracture.
IMPRESSION: No acute abnormality.

## 2020-10-04 IMAGING — CT CT HEAD W/O CM
2 of 3 series · 15 of 40 positions shown, 18 images · non-contrast
Comparison: Head and cervical spine CT scan 08/31/2019.

CLINICAL DATA: The patient fell out of bed this morning at [DATE]
a.m. Initial encounter.

EXAM:
CT HEAD WITHOUT CONTRAST
CT CERVICAL SPINE WITHOUT CONTRAST
TECHNIQUE: Multidetector CT imaging of the head and cervical spine was
performed following the standard protocol without intravenous
contrast. Multiplanar CT image reconstructions of the cervical spine
were also generated.

[Series 3: head wo · axial · 0.47mm/px · z∈[-113,+17]mm · 12 of 32 slices shown, 15 images]
[im 3/32  brain]
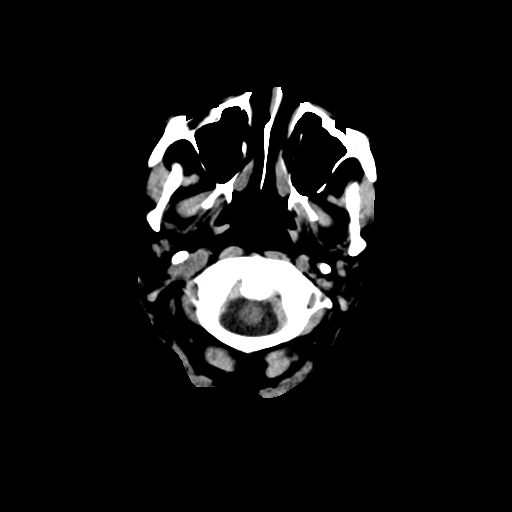
[im 3/32  bone]
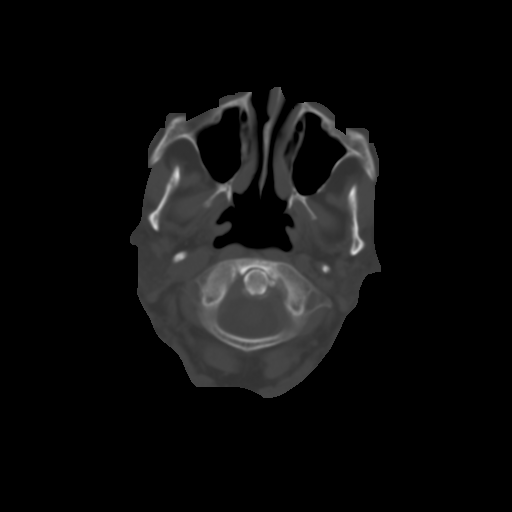
[im 5/32  brain]
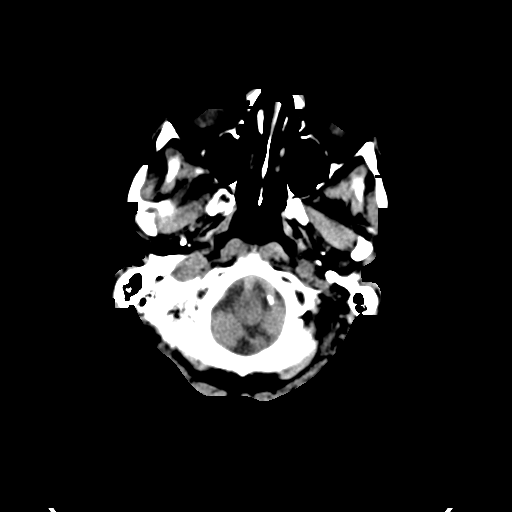
[im 7/32  brain]
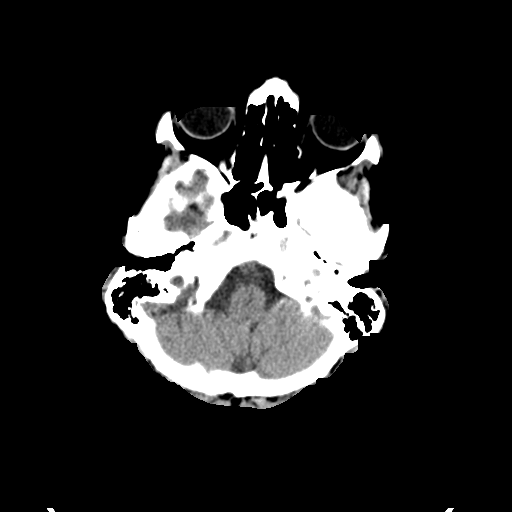
[im 10/32  brain]
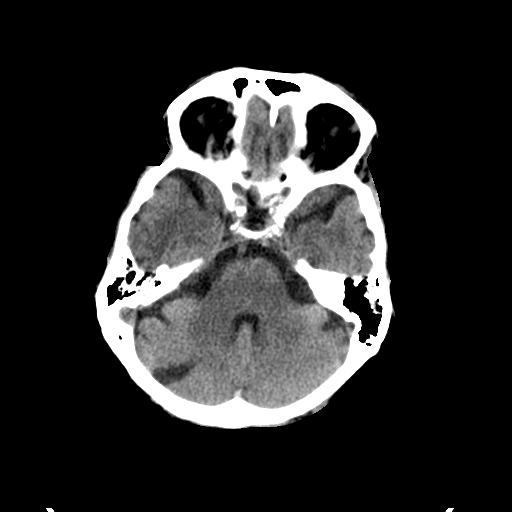
[im 12/32  brain]
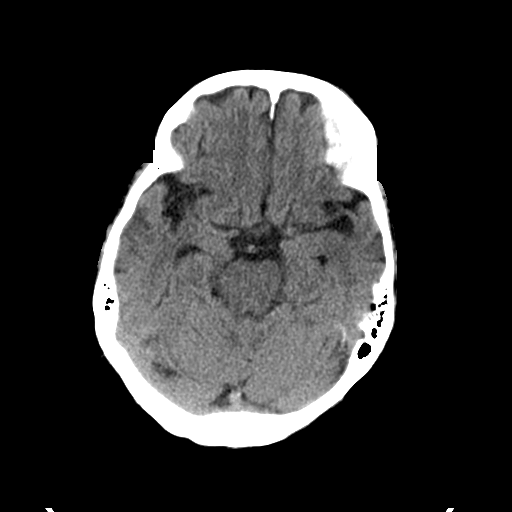
[im 12/32  bone]
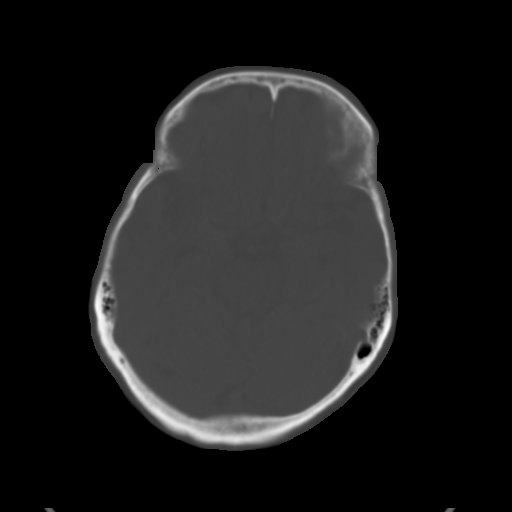
[im 14/32  brain]
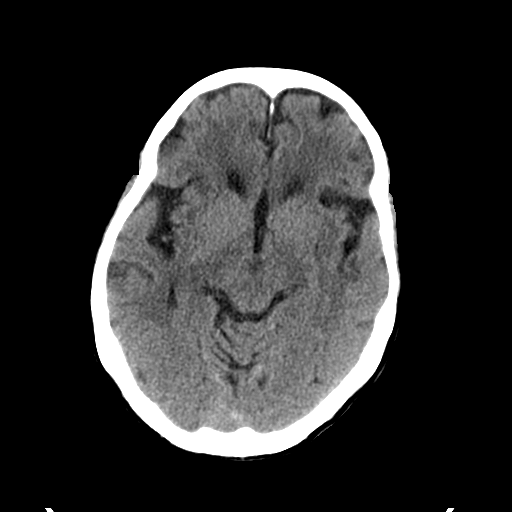
[im 18/32  brain]
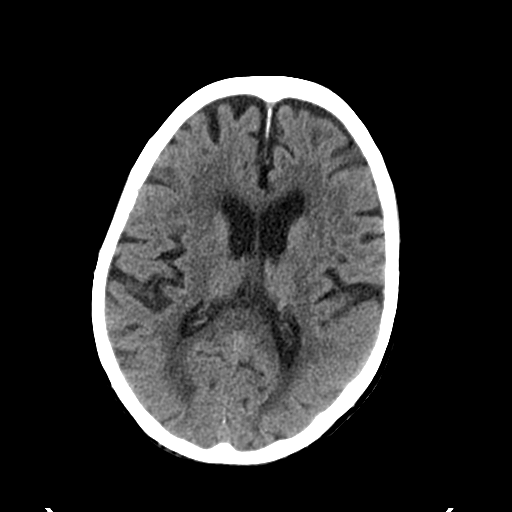
[im 20/32  brain]
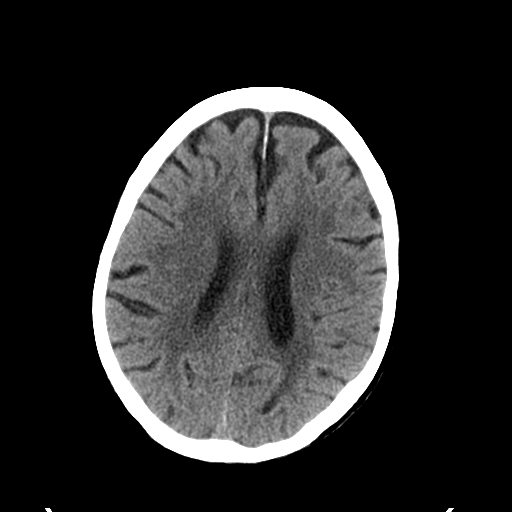
[im 22/32  brain]
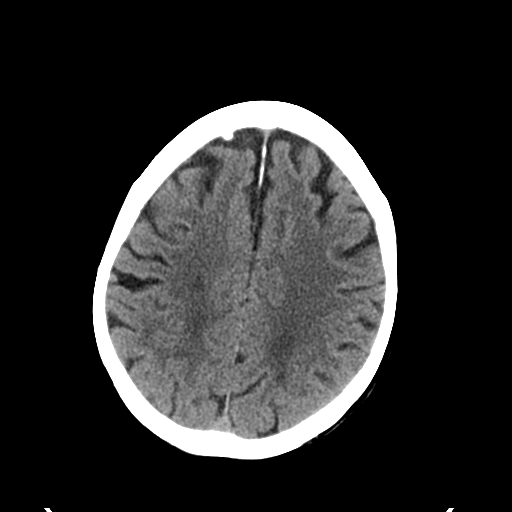
[im 22/32  bone]
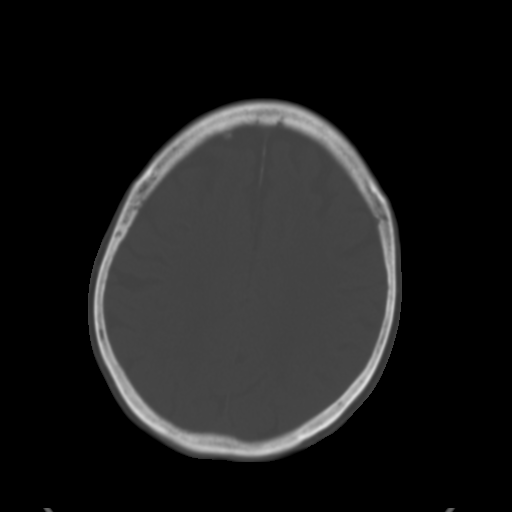
[im 25/32  brain]
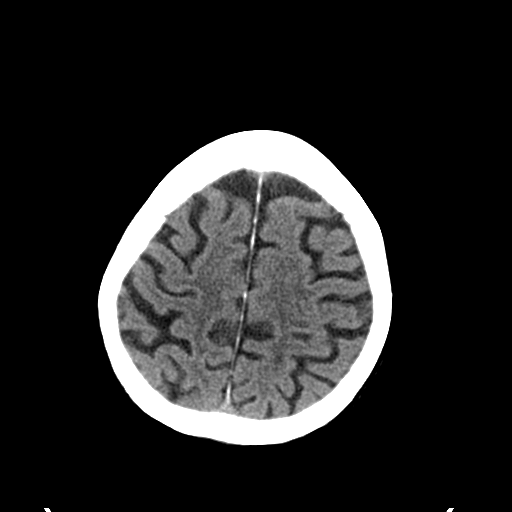
[im 27/32  brain]
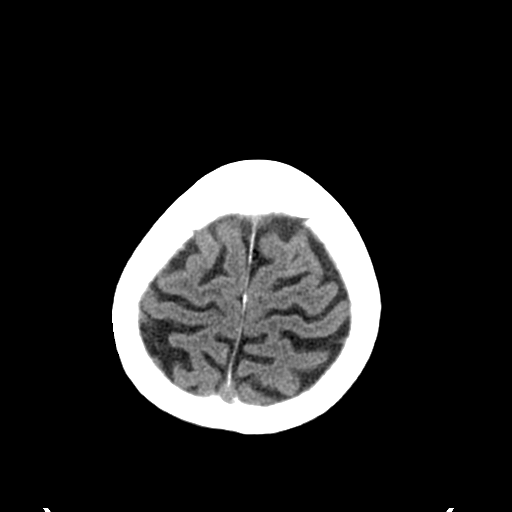
[im 29/32  brain]
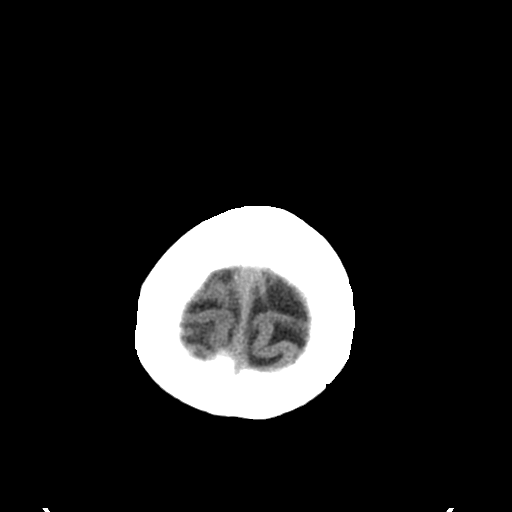

[Series 6: coronal soft tissue · coronal · 0.31mm/px · 3 of 65 slices shown]
[im 17/65  brain]
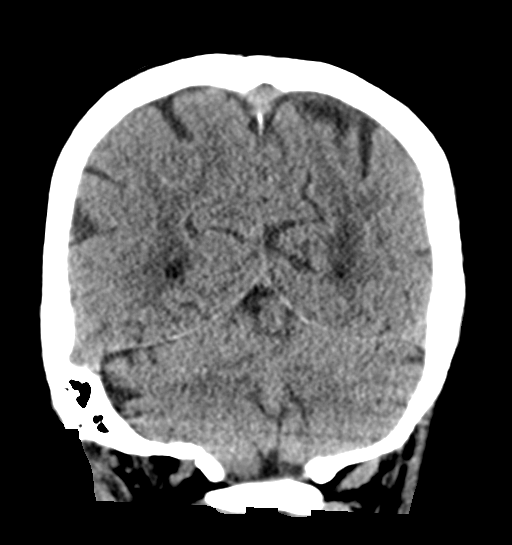
[im 33/65  brain]
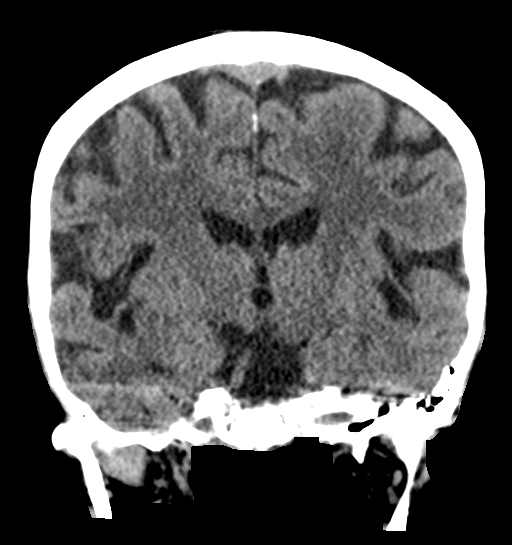
[im 49/65  brain]
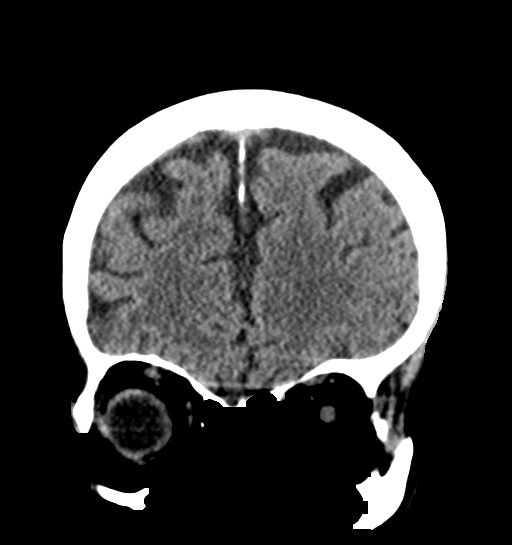

[15 of 40 positions shown; findings below may reference images not displayed]

FINDINGS: CT HEAD FINDINGS

Brain: No evidence of acute infarction, hemorrhage, hydrocephalus,
extra-axial collection or mass lesion/mass effect. Atrophy, chronic
microvascular ischemic change and remote lacunar infarctions in the
right cerebellum and right corona radiata noted.

Vascular: No hyperdense vessel or unexpected calcification.

Skull: Intact.  No focal lesion.

Sinuses/Orbits: Status post cataract surgery.  Otherwise negative.

Other: None.

CT CERVICAL SPINE FINDINGS

Alignment: Maintained with straightening of lordosis noted.

Skull base and vertebrae: No acute fracture. No primary bone lesion
or focal pathologic process.

Soft tissues and spinal canal: No prevertebral fluid or swelling. No
visible canal hematoma.

Disc levels:  Multilevel loss of disc space height noted.

Upper chest: Lung apices clear.

Other: None.
IMPRESSION: No acute abnormality head or cervical spine.

Atrophy, chronic microvascular ischemic change and remote lacunar
infarctions as described above.

Cervical spondylosis.

## 2020-10-04 IMAGING — CT CT HIP*R* W/O CM
2 of 3 series · 17 of 46 positions shown, 19 images · non-contrast
Comparison: Plain films right hip earlier today and 07/26/2019.

CLINICAL DATA: Right hip pain due to a fall out of bed at [DATE] a.m.
this morning. Initial encounter.

EXAM:
CT OF THE RIGHT HIP WITHOUT CONTRAST
TECHNIQUE: Multidetector CT imaging of the right hip was performed according to
the standard protocol. Multiplanar CT image reconstructions were
also generated.

[Series 4: axial st · axial · 0.40mm/px · z∈[+1146,+1374]mm · 14 of 132 slices shown, 16 images]
[im 9/132  soft-tissue]
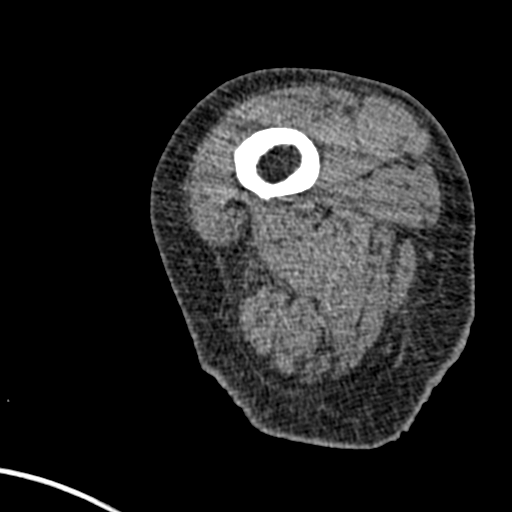
[im 9/132  bone]
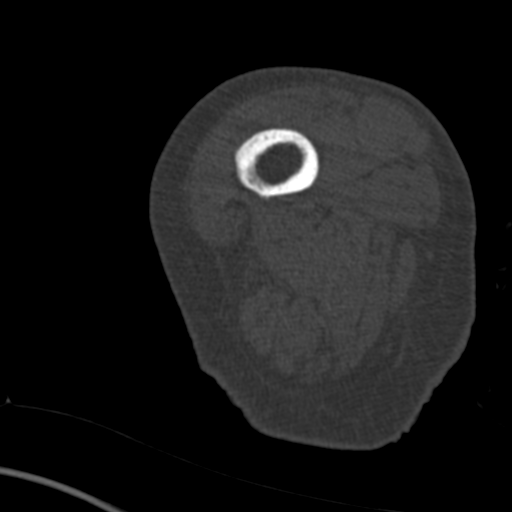
[im 17/132  soft-tissue]
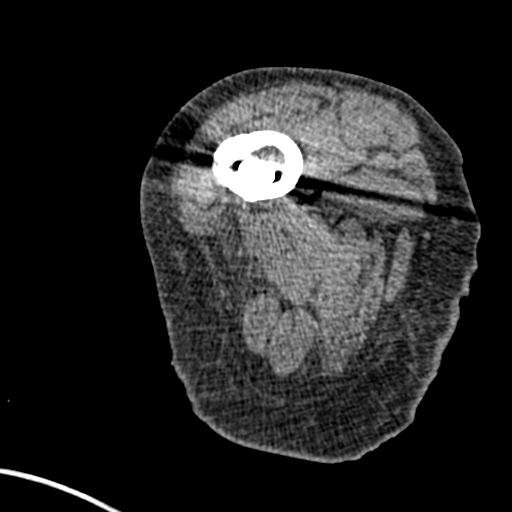
[im 26/132  soft-tissue]
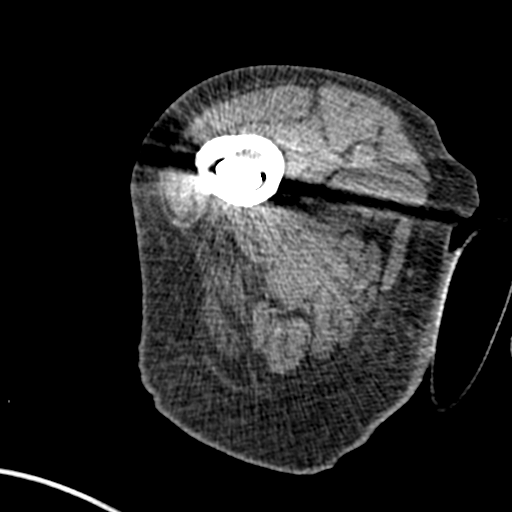
[im 34/132  soft-tissue]
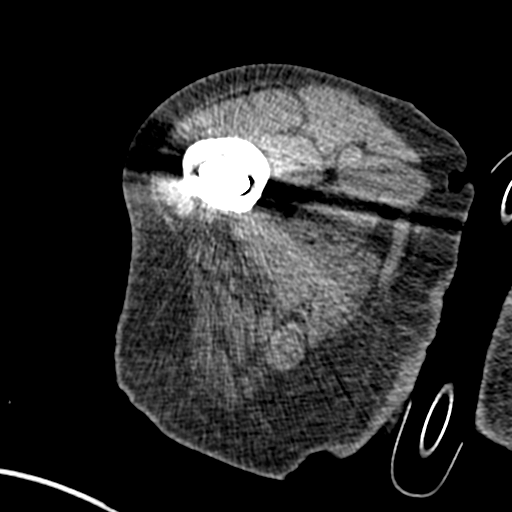
[im 43/132  soft-tissue]
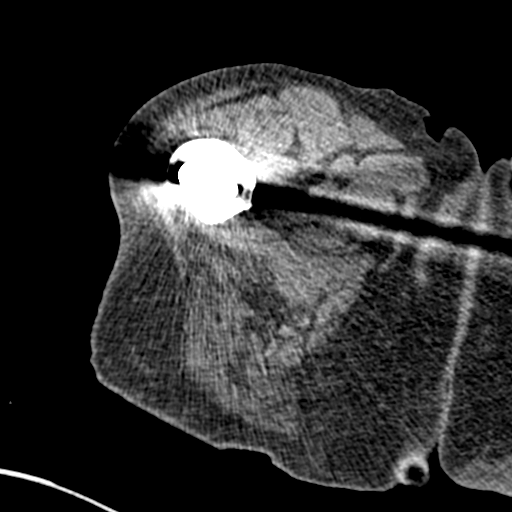
[im 51/132  soft-tissue]
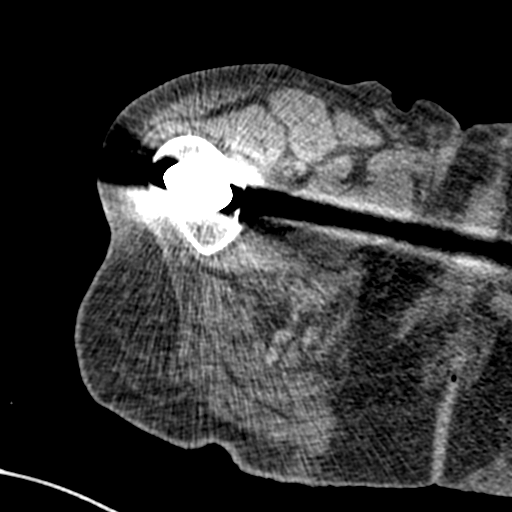
[im 60/132  soft-tissue]
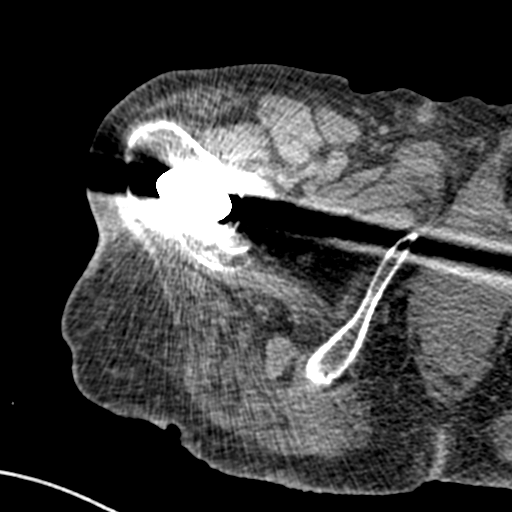
[im 72/132  soft-tissue]
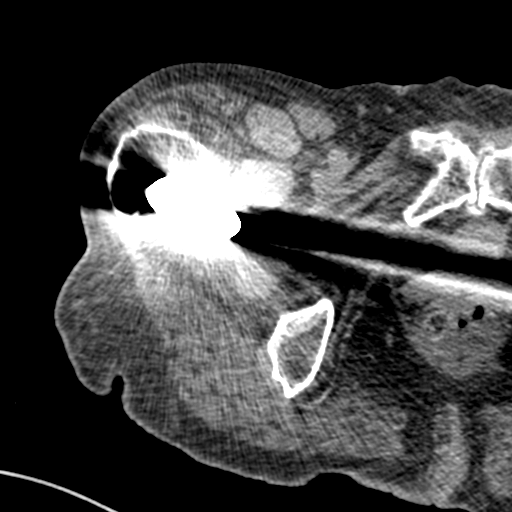
[im 81/132  soft-tissue]
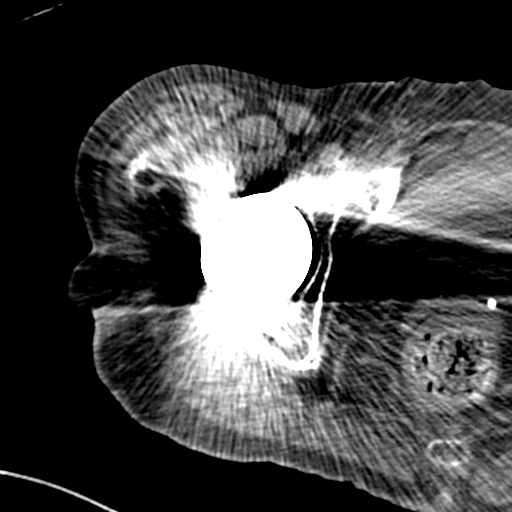
[im 81/132  bone]
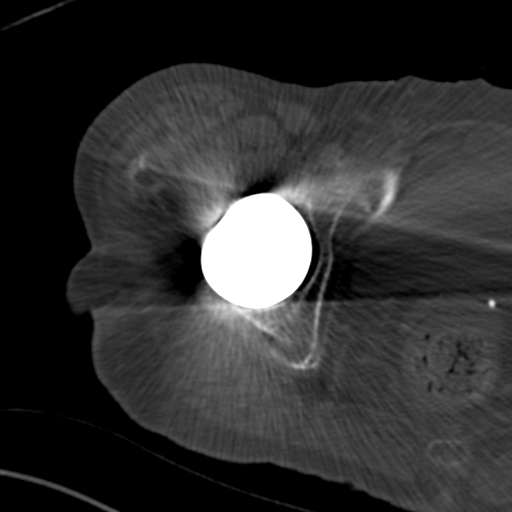
[im 89/132  soft-tissue]
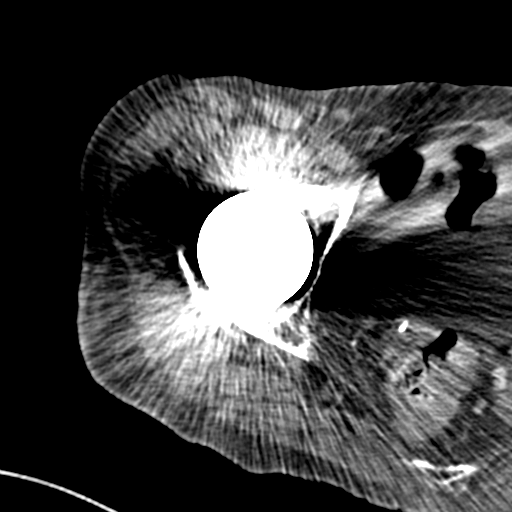
[im 98/132  soft-tissue]
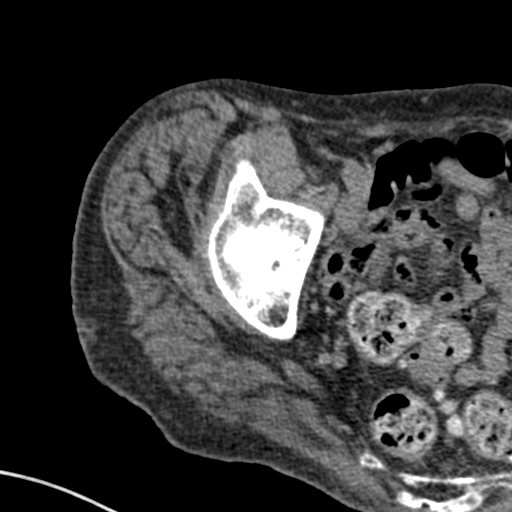
[im 106/132  soft-tissue]
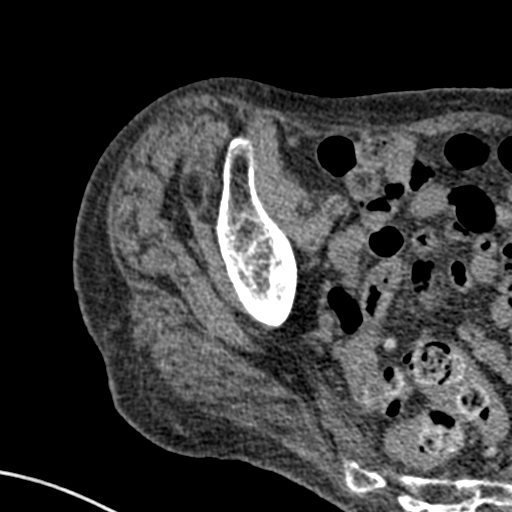
[im 115/132  soft-tissue]
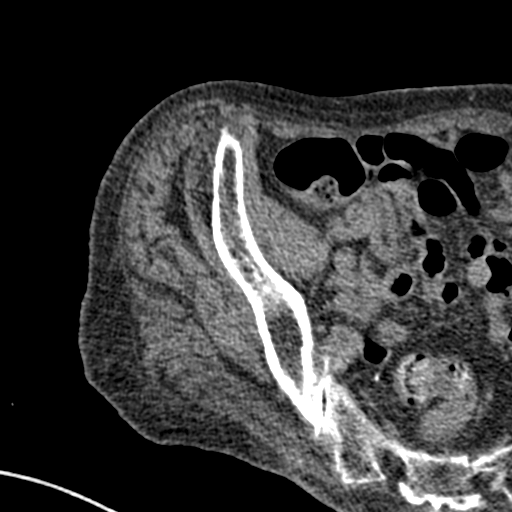
[im 123/132  soft-tissue]
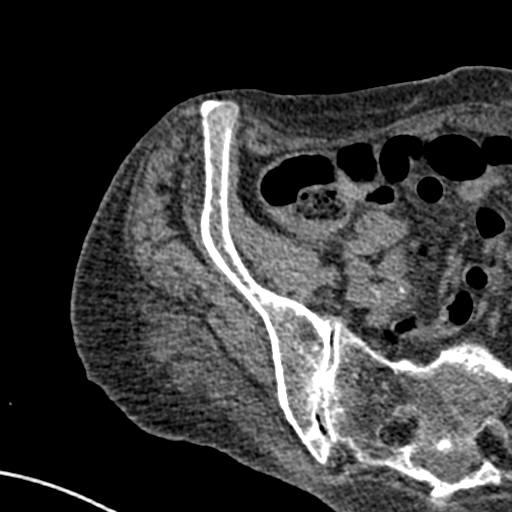

[Series 9: coronal st · coronal · 0.44mm/px · 3 of 87 slices shown]
[im 29/87  soft-tissue]
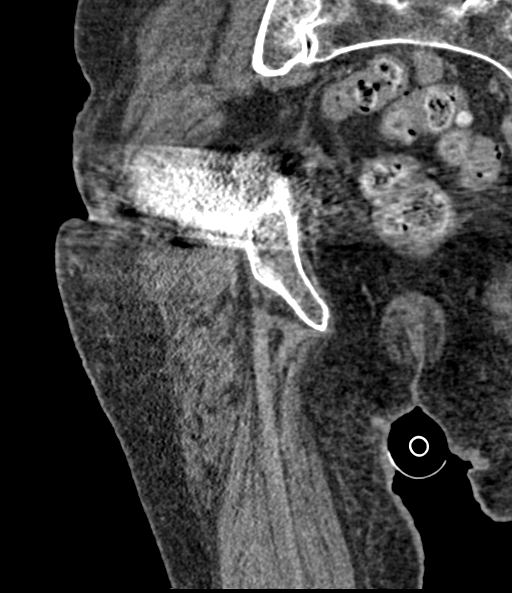
[im 39/87  soft-tissue]
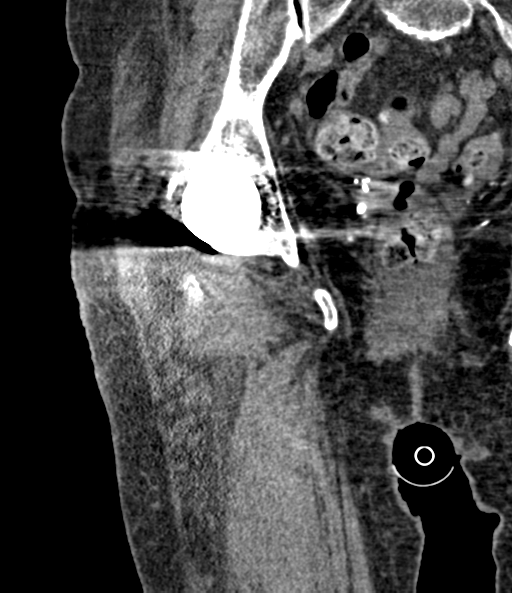
[im 48/87  soft-tissue]
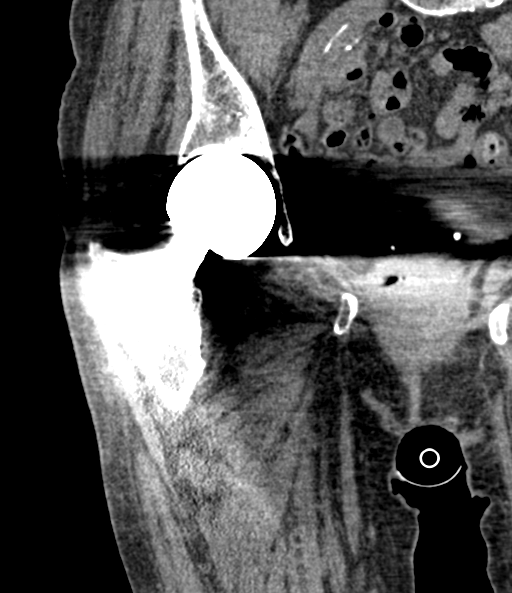

[17 of 46 positions shown; findings below may reference images not displayed]

FINDINGS: Bones/Joint/Cartilage

Bipolar right hip hemiarthroplasty is again seen. The device is
located. There is no fracture. Bones appear osteopenic. There is
some degenerative disease about the right SI joint and symphysis
pubis. No lytic or sclerotic lesion.

Ligaments

Suboptimally assessed by CT.

Muscles and Tendons

Appear normal.

Soft tissues

Sigmoid diverticulosis noted.
IMPRESSION: No acute abnormality.

Status post right hip replacement.  No hardware complication.

Degenerative disease at the symphysis pubis and right SI joint.

Sigmoid diverticulosis.

## 2020-10-05 IMAGING — CT CT L SPINE W/O CM
3 of 4 series · 14 of 33 positions shown, 16 images · non-contrast
Comparison: Right hip CT yesterday. Lumbar CT 01/18/2014. CT Chest,
Abdomen, and Pelvis 08/31/2019.

CLINICAL DATA: 87-year-old female status post fall out of bed at
7777 hours. Back pain.

EXAM:
CT LUMBAR SPINE WITHOUT CONTRAST
TECHNIQUE: Multidetector CT imaging of the lumbar spine was performed without
intravenous contrast administration. Multiplanar CT image
reconstructions were also generated.

[Series 4: l spine soft · axial · 0.51mm/px · z∈[+1233,+1431]mm · 6 of 129 slices shown, 8 images]
[im 20/129  soft-tissue]
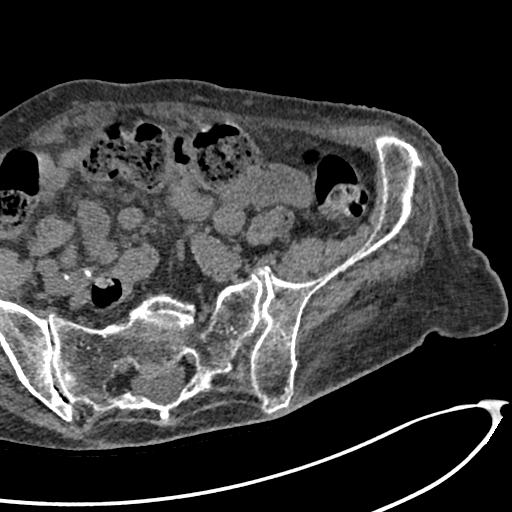
[im 20/129  bone]
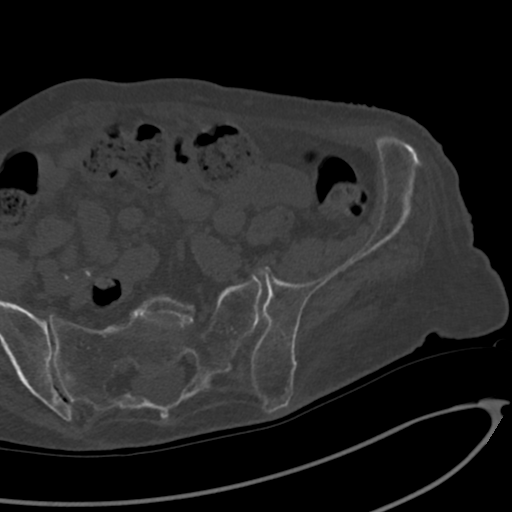
[im 40/129  bone]
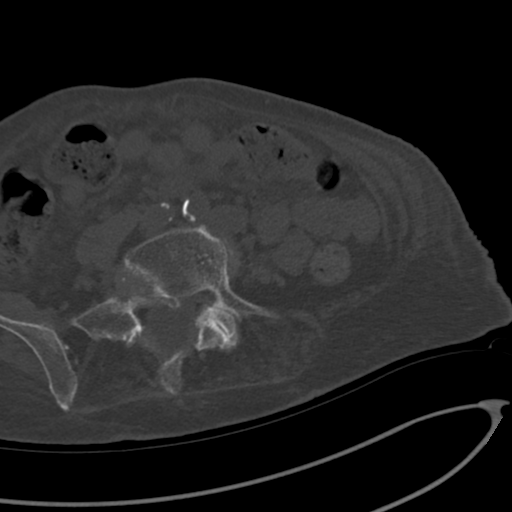
[im 60/129  bone]
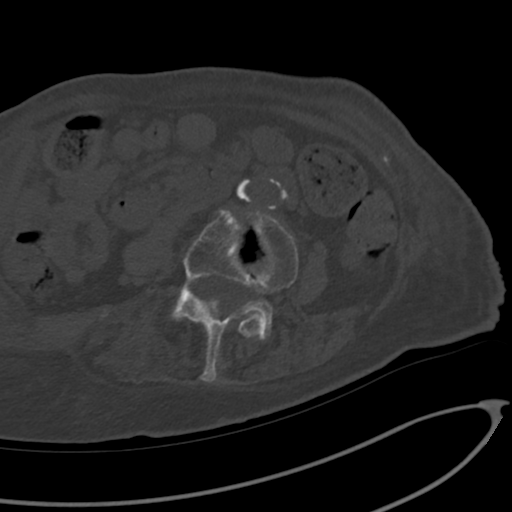
[im 79/129  bone]
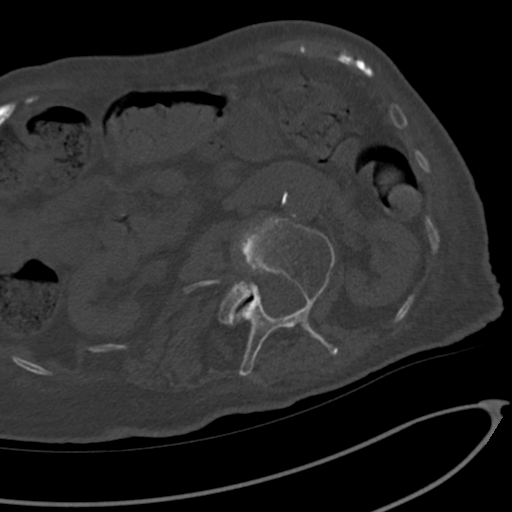
[im 99/129  soft-tissue]
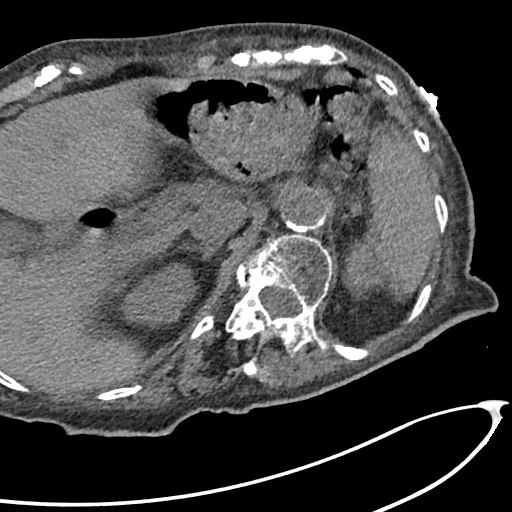
[im 99/129  bone]
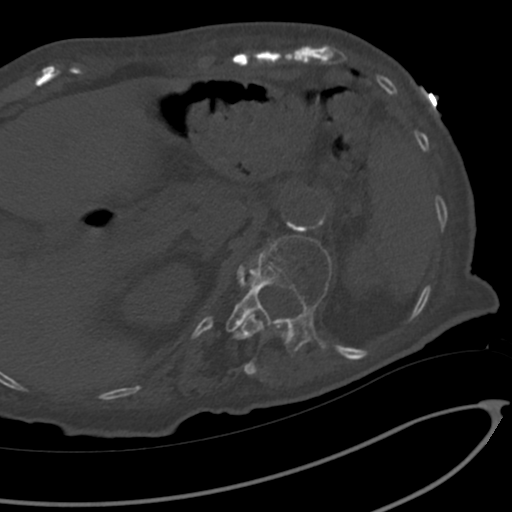
[im 119/129  bone]
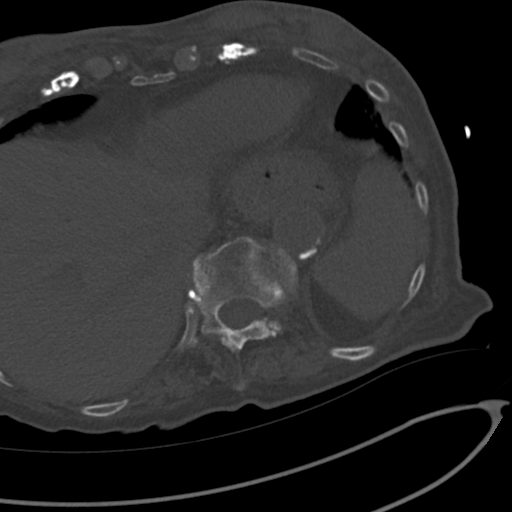

[Series 7: coronal bone · coronal · 0.38mm/px · 3 of 63 slices shown]
[im 13/63  bone]
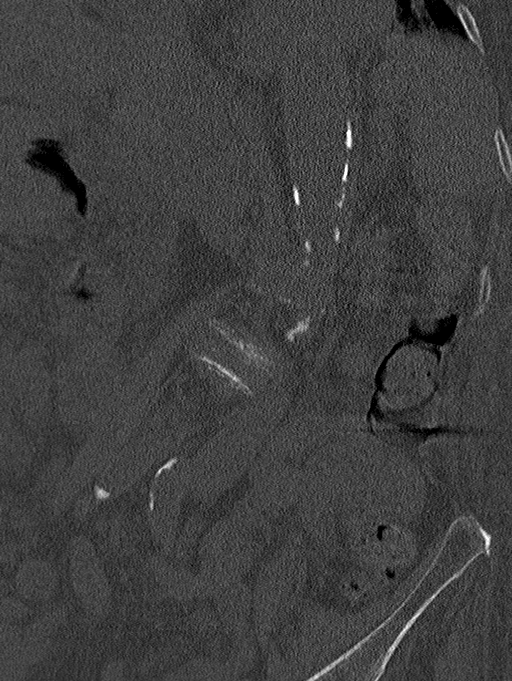
[im 25/63  bone]
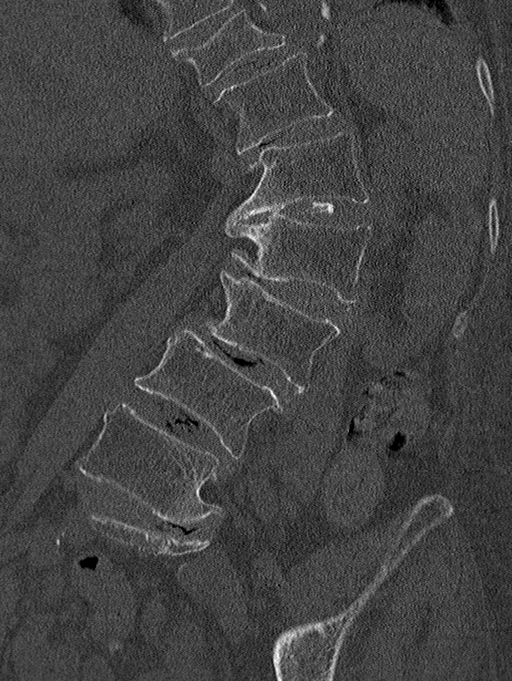
[im 38/63  bone]
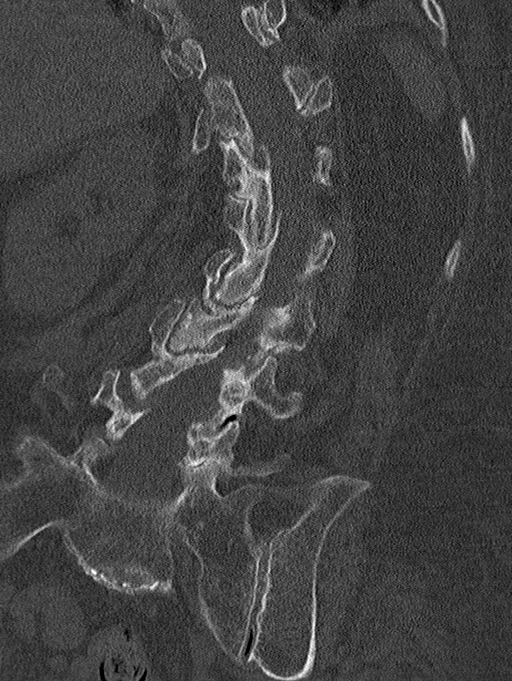

[Series 8: sagittal st · sagittal · 0.38mm/px · 5 of 81 slices shown]
[im 14/81  bone]
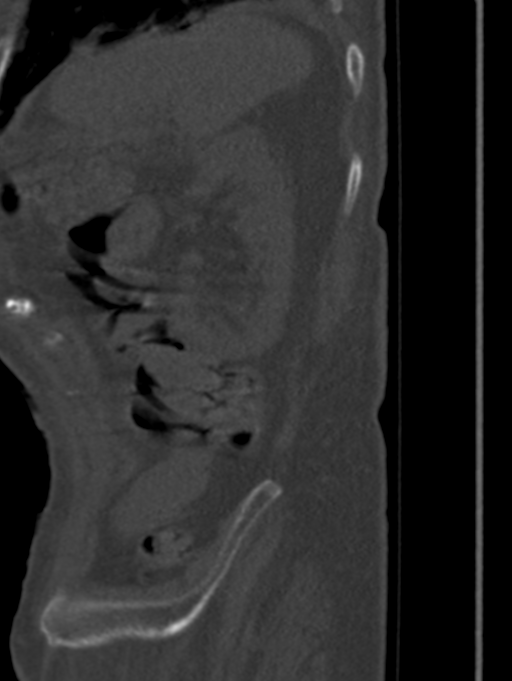
[im 27/81  bone]
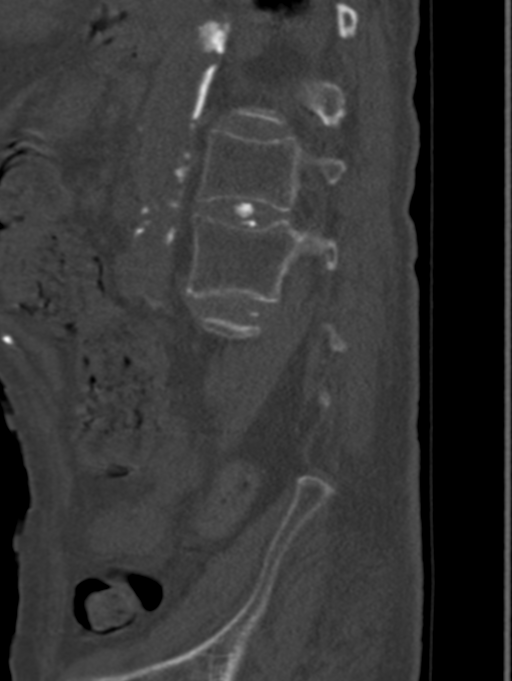
[im 41/81  bone]
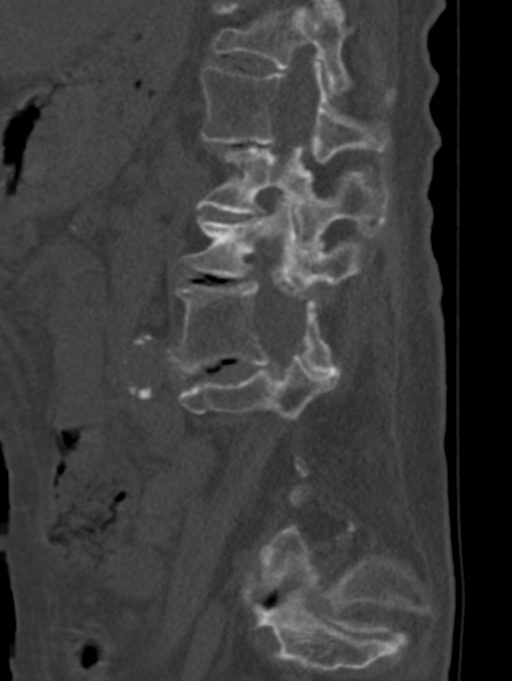
[im 54/81  bone]
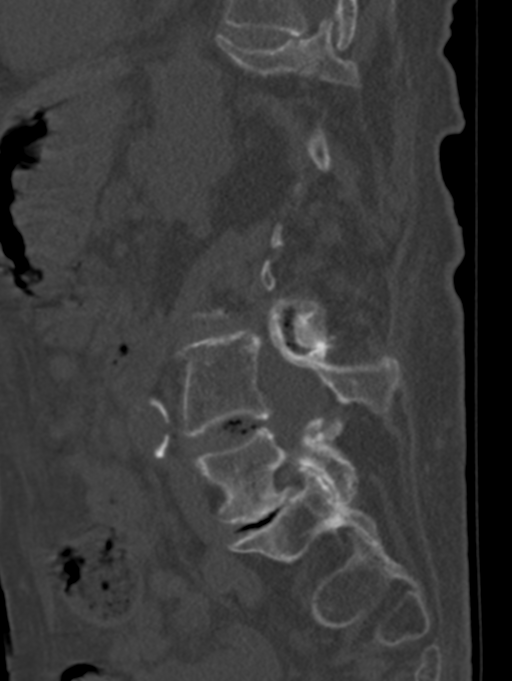
[im 67/81  bone]
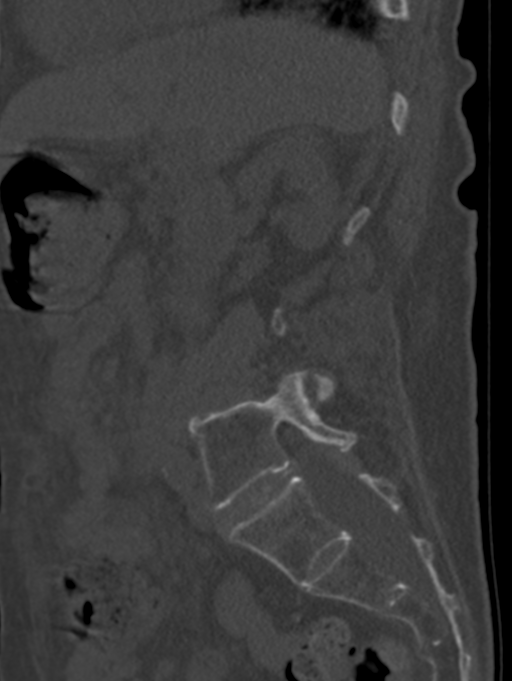

[14 of 33 positions shown; findings below may reference images not displayed]

FINDINGS: Segmentation: Normal.

Alignment: Chronic severe levoconvex lumbar and partially visible
dextroconvex thoracic scoliosis. Subsequent straightening of lumbar
lordosis.

Vertebrae: Chronic T11 compression fracture appears stable since
[DATE] and lumbar vertebral bodies appear intact. Visible
lower ribs appear intact.

Visible sacrum and SI joints appear stable and intact.

Paraspinal and other soft tissues: Aortoiliac calcified
atherosclerosis. Vascular patency is not evaluated in the absence of
IV contrast. Partially visible chronic gastric hiatal hernia.
Retained stool in the visible colon. Distal colon diverticulosis.
Negative noncontrast paraspinal soft tissues.

Disc levels:

Fairly capacious lower thoracic and lumbar spinal canal. Widespread
chronic lumbar disc degeneration and vacuum disc. Chronic endplate
spurring and moderate to severe right side facet arthropathy with
chronic vacuum facet. Mild progression overall of degenerative
changes since 7980. Mild to moderate multifactorial spinal stenosis
at L3-L[DATE] have progressed since 7980.
IMPRESSION: 1. No acute traumatic injury identified in the lumbar spine. Stable
T11 compression fracture since September 2019. Advanced chronic lumbar spine degeneration in the setting of
severe scoliosis. Widespread vacuum disc and vacuum facet phenomena.
Moderate multifactorial spinal stenosis suspected at L3-L4 and
probably progressed since [DATE]. Aortic Atherosclerosis ([id number].K). Distal large bowel
diverticulosis. Chronic hiatal hernia.

## 2020-10-10 ENCOUNTER — Other Ambulatory Visit: Payer: Self-pay

## 2020-10-10 ENCOUNTER — Inpatient Hospital Stay (HOSPITAL_COMMUNITY)
Admission: EM | Admit: 2020-10-10 | Discharge: 2020-10-13 | DRG: 064 | Disposition: A | Discharge status: Skilled Nursing Facility | Payer: Medicare Other | Source: Skilled Nursing Facility | Attending: Internal Medicine | Admitting: Internal Medicine

## 2020-10-10 ENCOUNTER — Encounter (HOSPITAL_COMMUNITY): Payer: Self-pay | Admitting: Emergency Medicine

## 2020-10-10 ENCOUNTER — Emergency Department (HOSPITAL_COMMUNITY): Payer: Medicare Other

## 2020-10-10 DIAGNOSIS — Z8673 Personal history of transient ischemic attack (TIA), and cerebral infarction without residual deficits: Secondary | ICD-10-CM

## 2020-10-10 DIAGNOSIS — R627 Adult failure to thrive: Secondary | ICD-10-CM

## 2020-10-10 DIAGNOSIS — Z681 Body mass index (BMI) 19 or less, adult: Secondary | ICD-10-CM

## 2020-10-10 DIAGNOSIS — Z881 Allergy status to other antibiotic agents status: Secondary | ICD-10-CM

## 2020-10-10 DIAGNOSIS — G8929 Other chronic pain: Secondary | ICD-10-CM | POA: Diagnosis present

## 2020-10-10 DIAGNOSIS — Z20822 Contact with and (suspected) exposure to covid-19: Secondary | ICD-10-CM | POA: Diagnosis present

## 2020-10-10 DIAGNOSIS — B9562 Methicillin resistant Staphylococcus aureus infection as the cause of diseases classified elsewhere: Secondary | ICD-10-CM | POA: Diagnosis present

## 2020-10-10 DIAGNOSIS — Z79899 Other long term (current) drug therapy: Secondary | ICD-10-CM

## 2020-10-10 DIAGNOSIS — R296 Repeated falls: Secondary | ICD-10-CM | POA: Diagnosis present

## 2020-10-10 DIAGNOSIS — Z90710 Acquired absence of both cervix and uterus: Secondary | ICD-10-CM

## 2020-10-10 DIAGNOSIS — I639 Cerebral infarction, unspecified: Secondary | ICD-10-CM | POA: Diagnosis not present

## 2020-10-10 DIAGNOSIS — Z86711 Personal history of pulmonary embolism: Secondary | ICD-10-CM

## 2020-10-10 DIAGNOSIS — Z1611 Resistance to penicillins: Secondary | ICD-10-CM | POA: Diagnosis present

## 2020-10-10 DIAGNOSIS — N63 Unspecified lump in unspecified breast: Secondary | ICD-10-CM

## 2020-10-10 DIAGNOSIS — E785 Hyperlipidemia, unspecified: Secondary | ICD-10-CM | POA: Diagnosis present

## 2020-10-10 DIAGNOSIS — F0390 Unspecified dementia without behavioral disturbance: Secondary | ICD-10-CM | POA: Diagnosis present

## 2020-10-10 DIAGNOSIS — F32A Depression, unspecified: Secondary | ICD-10-CM | POA: Diagnosis present

## 2020-10-10 DIAGNOSIS — Z7982 Long term (current) use of aspirin: Secondary | ICD-10-CM

## 2020-10-10 DIAGNOSIS — Z66 Do not resuscitate: Secondary | ICD-10-CM | POA: Diagnosis present

## 2020-10-10 DIAGNOSIS — Z882 Allergy status to sulfonamides status: Secondary | ICD-10-CM

## 2020-10-10 DIAGNOSIS — Z9109 Other allergy status, other than to drugs and biological substances: Secondary | ICD-10-CM

## 2020-10-10 DIAGNOSIS — H9190 Unspecified hearing loss, unspecified ear: Secondary | ICD-10-CM | POA: Diagnosis present

## 2020-10-10 DIAGNOSIS — Z96641 Presence of right artificial hip joint: Secondary | ICD-10-CM | POA: Diagnosis present

## 2020-10-10 DIAGNOSIS — M810 Age-related osteoporosis without current pathological fracture: Secondary | ICD-10-CM | POA: Diagnosis present

## 2020-10-10 DIAGNOSIS — E43 Unspecified severe protein-calorie malnutrition: Secondary | ICD-10-CM | POA: Diagnosis present

## 2020-10-10 DIAGNOSIS — G9341 Metabolic encephalopathy: Secondary | ICD-10-CM | POA: Diagnosis present

## 2020-10-10 DIAGNOSIS — I10 Essential (primary) hypertension: Secondary | ICD-10-CM | POA: Diagnosis present

## 2020-10-10 DIAGNOSIS — Z87891 Personal history of nicotine dependence: Secondary | ICD-10-CM

## 2020-10-10 DIAGNOSIS — R29714 NIHSS score 14: Secondary | ICD-10-CM | POA: Diagnosis present

## 2020-10-10 DIAGNOSIS — R739 Hyperglycemia, unspecified: Secondary | ICD-10-CM

## 2020-10-10 DIAGNOSIS — Z7902 Long term (current) use of antithrombotics/antiplatelets: Secondary | ICD-10-CM

## 2020-10-10 DIAGNOSIS — R4182 Altered mental status, unspecified: Secondary | ICD-10-CM | POA: Diagnosis present

## 2020-10-10 DIAGNOSIS — N390 Urinary tract infection, site not specified: Secondary | ICD-10-CM | POA: Diagnosis present

## 2020-10-10 HISTORY — DX: Cerebral infarction, unspecified: I63.9

## 2020-10-10 LAB — CBC
HCT: 40.8 % (ref 36.0–46.0)
Hemoglobin: 13.1 g/dL (ref 12.0–15.0)
MCH: 28.9 pg (ref 26.0–34.0)
MCHC: 32.1 g/dL (ref 30.0–36.0)
MCV: 90.1 fL (ref 80.0–100.0)
Platelets: 365 10*3/uL (ref 150–400)
RBC: 4.53 MIL/uL (ref 3.87–5.11)
RDW: 13.6 % (ref 11.5–15.5)
WBC: 9.4 10*3/uL (ref 4.0–10.5)
nRBC: 0 % (ref 0.0–0.2)

## 2020-10-10 LAB — BASIC METABOLIC PANEL
Anion gap: 7 (ref 5–15)
BUN: 23 mg/dL (ref 8–23)
CO2: 30 mmol/L (ref 22–32)
Calcium: 10.1 mg/dL (ref 8.9–10.3)
Chloride: 98 mmol/L (ref 98–111)
Creatinine, Ser: 0.83 mg/dL (ref 0.44–1.00)
GFR, Estimated: >60 mL/min (ref >60)
Glucose, Bld: 153 mg/dL — ABNORMAL HIGH (ref 70–99)
Potassium: 3.7 mmol/L (ref 3.5–5.1)
Sodium: 135 mmol/L (ref 135–145)

## 2020-10-10 LAB — URINALYSIS, ROUTINE W REFLEX MICROSCOPIC
Bilirubin Urine: NEGATIVE
Glucose, UA: NEGATIVE mg/dL
Ketones, ur: NEGATIVE mg/dL
Nitrite: NEGATIVE
Protein, ur: NEGATIVE mg/dL
Specific Gravity, Urine: 1.013 (ref 1.005–1.030)
WBC, UA: >50 WBC/hpf — ABNORMAL HIGH (ref 0–5)
pH: 6 (ref 5.0–8.0)

## 2020-10-10 LAB — CBG MONITORING, ED: Glucose-Capillary: 130 mg/dL — ABNORMAL HIGH (ref 70–99)

## 2020-10-10 MED ORDER — SODIUM CHLORIDE 0.9 % IV BOLUS
500.0000 mL | Freq: Once | INTRAVENOUS | Status: AC
  Administered 2020-10-10: 500 mL via INTRAVENOUS

## 2020-10-10 MED ORDER — SODIUM CHLORIDE 0.9 % IV SOLN
1.0000 g | Freq: Once | INTRAVENOUS | Status: AC
  Administered 2020-10-10: 1 g via INTRAVENOUS
  Filled 2020-10-10: qty 10

## 2020-10-10 NOTE — ED Notes (Signed)
Spoke with daughter over the phone. Daughter states the pt. Can hold a conversation but just  Needs to form word at a time. Daughter states that pt. Ate half of her dinner.

## 2020-10-10 NOTE — ED Triage Notes (Signed)
[facility] - pt from [place] [facility] [place], staff states pt went unresponsive x 10 minutes. VSS during LOC. Pt alert to voice upon arrival.

## 2020-10-10 NOTE — ED Provider Notes (Signed)
[facility] EMERGENCY DEPARTMENT Provider Note   CSN: [id number] Arrival date & time: 10/10/20  2038     History Chief Complaint  Patient presents with  . Loss of Consciousness    Angelica Bell is a 86 y.o. female.  HPI She presents by EMS for reported unresponsiveness for 10 minutes, at her nursing care facility.  It is unclear what her baseline mental status is.  She has previously been DNR, while hospitalized at this facility.  Level 5 caveat-altered mental status    Past Medical History:  Diagnosis Date  . Chronic back pain    with degenerative joint disease  . Chronic pain disorder    with narcotic management  . Dry eyes   . Frequent falls   . HOH (hard of hearing)   . Hypertension   . Multiple rib fractures 02/2020  . Osteoporosis   . PE (pulmonary embolism)    After hysterectomy in 1967  . Pneumonia   . Rhabdomyolysis   . Stroke [facility])     Patient Active Problem List   Diagnosis Date Noted  . Palliative care by specialist   . Goals of care, counseling/discussion   . DNR (do not resuscitate) discussion   . Acute CVA (cerebrovascular accident) ([place]) 08/31/2020  . Acute metabolic encephalopathy 66/05/3015  . Acute respiratory failure with hypoxia ([place]) 05/17/2020  . Opioid overdose ([place]) 05/13/2020  . DNR (do not resuscitate) 03/09/2020  . Frequent falls 03/09/2020  . Multiple rib fractures 03/08/2020  . Common bile duct dilatation 03/08/2020  . Leukocytosis 03/08/2020  . Normocytic anemia 03/08/2020  . Hypomagnesemia 02/08/2020  . Fall 02/06/2020  . AKI (acute kidney injury) ([place]) 02/05/2020  . UTI (urinary tract infection) 09/01/2019  . Rhabdomyolysis 09/01/2019  . Shock ([place]) 08/31/2019  . Acute cystitis 08/03/2019  . Confusion and disorientation 08/03/2019  . Cerebrovascular accident (CVA) ([place])   . ARF (acute renal failure) ([place]) 07/26/2019  . Hyponatremia 07/26/2019  . Abnormal liver function 07/26/2019  . Peripheral neuropathy   .  Drug overdose   . Polypharmacy   . Altered mental status 10/25/2017  . Closed displaced intertrochanteric fracture of left femur ([place]) 02/16/2017  . Hip fracture, right ([place]) 12/25/2015  . Chronic pain 12/24/2015  . Fracture of femoral neck, right, closed ([place]) 12/24/2015  . Essential hypertension 12/24/2015  . Community acquired pneumonia 04/15/2013  . Hypokalemia 04/15/2013  . Labial cyst 02/08/2012  . POSTMENOPAUSAL SYNDROME 11/28/2009  . Mononeuritis of lower limb 02/22/2009  . INSOMNIA-SLEEP DISORDER-UNSPEC 10/12/2008  . Lumbago 08/02/2008  . 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 ANXIETY DEPRESSION 12/16/2007  . Hyperlipidemia 07/08/2007  . Osteoarthritis 07/08/2007  . Osteoporosis 07/08/2007  . SCOLIOSIS NEC 07/08/2007    Past Surgical History:  Procedure Laterality Date  . ABDOMINAL HYSTERECTOMY    . APPENDECTOMY    . BREAST ENHANCEMENT SURGERY    . CATARACT EXTRACTION    . FEMUR IM NAIL Left 02/17/2017   Procedure: INTRAMEDULLARY (IM) NAIL FEMORAL;  Surgeon: Nicholes Stairs, MD;  Location: [facility];  Service: Orthopedics;  Laterality: Left;  . HIP ARTHROPLASTY Right 12/24/2015   Procedure: ARTHROPLASTY BIPOLAR HIP (HEMIARTHROPLASTY);  Surgeon: Netta Cedars, MD;  Location: [facility];  Service: Orthopedics;  Laterality: Right;  . TONSILLECTOMY       OB History   No obstetric history on file.     Family History  Problem Relation Age of Onset  . Heart Problems Mother     Social History   Tobacco  Use  . Smoking status: Former Smoker    Years: 13.00    Quit date: 04/15/1976    Years since quitting: 44.5  . Smokeless tobacco: Never Used  Vaping Use  . Vaping Use: Never used  Substance Use Topics  . Alcohol use: No  . Drug use: No    Home Medications Prior to Admission medications   Medication Sig Start Date End Date Taking? Authorizing Provider  cholecalciferol (VITAMIN D) 25 MCG tablet Take 1 tablet (1,000 Units total) by mouth daily. 09/05/20   Yes Shelly Coss, MD  gabapentin (NEURONTIN) 100 MG capsule Take 1 capsule (100 mg total) by mouth 3 (three) times daily. Patient taking differently: Take 100 mg by mouth as needed (pain).  02/08/20  Yes Harold Hedge, MD  oxyCODONE (OXY IR/ROXICODONE) 5 MG immediate release tablet Take 1 tablet (5 mg total) by mouth every 6 (six) hours as needed for severe pain. 09/05/20  Yes Shelly Coss, MD  acetaminophen (TYLENOL) 500 MG tablet Take 500 mg by mouth every 6 (six) hours as needed for moderate pain.     [provider]  amLODipine (NORVASC) 2.5 MG tablet Take 1 tablet (2.5 mg total) by mouth daily. 05/18/20   Charlynne Cousins, MD  aspirin EC 81 MG EC tablet Take 1 tablet (81 mg total) by mouth daily. 09/15/19   Matcha, Beverely Pace, MD  atorvastatin (LIPITOR) 40 MG tablet Take 1 tablet (40 mg total) by mouth daily. 09/05/20   Shelly Coss, MD  clopidogrel (PLAVIX) 75 MG tablet Take 1 tablet (75 mg total) by mouth daily. Stop after 25 days 09/06/20   Shelly Coss, MD  donepezil (ARICEPT) 5 MG tablet Take 5 mg by mouth daily. 06/28/20   [provider]  escitalopram (LEXAPRO) 20 MG tablet Take 20 mg by mouth daily. 06/22/20   [provider]  feeding supplement, ENSURE ENLIVE, (ENSURE ENLIVE) LIQD Take 237 mLs by mouth 2 (two) times daily between meals. 03/10/20   Black, Lezlie Octave, NP  Multiple Vitamin (MULTIVITAMIN WITH MINERALS) TABS tablet Take 1 tablet by mouth daily. 03/11/20   Black, Lezlie Octave, NP    Allergies    Hctz [hydrochlorothiazide], Amoxicillin-pot clavulanate, Lisinopril-hydrochlorothiazide, Sulfa antibiotics, and Sulfonamide derivatives  Review of Systems   Review of Systems  Unable to perform ROS: Mental status change    Physical Exam Updated Vital Signs BP 120/71   Pulse 66   Temp (!) 97.1 F (36.2 C) (Rectal)   Resp (!) 22   Ht 5\' 6"  (1.676 m)   Wt 48 kg   SpO2 93%   BMI 17.08 kg/m   Physical Exam Vitals and nursing note reviewed.    Constitutional:      General: She is not in acute distress.    Appearance: She is well-developed. She is not ill-appearing, toxic-appearing or diaphoretic.  HENT:     Head: Normocephalic and atraumatic.     Right Ear: External ear normal.     Left Ear: External ear normal.  Eyes:     Conjunctiva/sclera: Conjunctivae normal.     Pupils: Pupils are equal, round, and reactive to light.  Neck:     Trachea: Phonation normal.  Cardiovascular:     Rate and Rhythm: Normal rate and regular rhythm.     Heart sounds: Normal heart sounds.  Pulmonary:     Effort: Pulmonary effort is normal.     Breath sounds: Normal breath sounds.  Abdominal:     General: There is no  distension.     Palpations: Abdomen is soft.     Tenderness: There is no abdominal tenderness.     Comments: Breasts are firm to touch bilaterally.  Musculoskeletal:        General: Normal range of motion.     Cervical back: Normal range of motion and neck supple.  Skin:    General: Skin is warm and dry.  Neurological:     Mental Status: She is alert.     Cranial Nerves: No cranial nerve deficit.     Motor: No abnormal muscle tone.     Coordination: Coordination normal.     Comments: Patient does not speak but mouths words in response to questions.  No asymmetry of muscle tone or limited movement seen during physical examination.  Face appears symmetric.  Psychiatric:     Comments: Responsive but nonverbal     ED Results / Procedures / Treatments   Labs (all labs ordered are listed, but only abnormal results are displayed) Labs Reviewed  BASIC METABOLIC PANEL - Abnormal; Notable for the following components:      Result Value   Glucose, Bld 153 (*)    All other components within normal limits  URINALYSIS, ROUTINE W REFLEX MICROSCOPIC - Abnormal; Notable for the following components:   APPearance HAZY (*)    Hgb urine dipstick SMALL (*)    Leukocytes,Ua LARGE (*)    WBC, UA >50 (*)    Bacteria, UA RARE (*)    All  other components within normal limits  CBG MONITORING, ED - Abnormal; Notable for the following components:   Glucose-Capillary 130 (*)    All other components within normal limits  RESPIRATORY PANEL BY RT PCR (FLU A&B, COVID)  URINE CULTURE  CBC    EKG EKG Interpretation  Date/Time:  Tuesday October 10 2020 20:44:18 EDT Ventricular Rate:  73 PR Interval:    QRS Duration: 81 QT Interval:  379 QTC Calculation: 418 R Axis:   36 Text Interpretation: Sinus rhythm Atrial premature complex Abnormal R-wave progression, early transition since last tracing no significant change Confirmed by Daleen Bo [phone]) on 10/10/2020 11:24:48 PM   Radiology CT Head Wo Contrast  Result Date: 10/11/2020 CLINICAL DATA:  Neuro deficit, acute, stroke suspected. Less responsive. EXAM: CT HEAD WITHOUT CONTRAST TECHNIQUE: Contiguous axial images were obtained from the base of the skull through the vertex without intravenous contrast. COMPARISON:  MR head 08/31/2020, CT head 08/29/2020, CT head 05/12/2020. FINDINGS: Brain: Chronic right cerebellar infarction. Left frontoparietal white matter hypodensity consistent with interval evolution of recent subacute/chronic infarction seen on MR head 08/31/2020. Interval development of gray-white matter differentiation within the left frontoparietal lobe may represent a superimposed acute component on a subacute/chronic infarction (2:18, 4:37). Patchy and confluent areas of decreased attenuation are noted throughout the deep and periventricular white matter of the cerebral hemispheres bilaterally, compatible with chronic microvascular ischemic disease. No parenchymal hemorrhage. No mass lesion. No extra-axial collection. No mass effect or midline shift. No hydrocephalus. Basilar cisterns are patent. Vascular: No hyperdense vessel. Skull: No acute fracture or focal lesion. Sinuses/Orbits: Paranasal sinuses and mastoid air cells are clear. The orbits are unremarkable. Other:  None. IMPRESSION: 1. Possible acute on chronic left frontoparietal infarction. Consider MRI noncontrast for further evaluation. 2. No intracranial hemorrhage. These results were called by telephone at the time of interpretation on 10/11/2020 at 12:04 am to provider [facility] , who verbally acknowledged these results. Electronically Signed   By: Clelia Croft.D.  On: 10/11/2020 00:07    Procedures .Critical Care Performed by: Daleen Bo, MD Authorized by: Daleen Bo, MD   Critical care provider statement:    Critical care time (minutes):  35   Critical care start time:  10/10/2020 8:40 PM   Critical care end time:  10/11/2020 12:28 AM   Critical care time was exclusive of:  Separately billable procedures and treating other patients   Critical care was necessary to treat or prevent imminent or life-threatening deterioration of the following conditions:  CNS failure or compromise   Critical care was time spent personally by me on the following activities:  Blood draw for specimens, development of treatment plan with patient or surrogate, discussions with consultants, evaluation of patient's response to treatment, examination of patient, obtaining history from patient or surrogate, ordering and performing treatments and interventions, ordering and review of laboratory studies, pulse oximetry, re-evaluation of patient's condition, review of old charts and ordering and review of radiographic studies   (including critical care time)  Medications Ordered in ED Medications  sodium chloride 0.9 % bolus 500 mL (0 mLs Intravenous Stopped 10/10/20 2350)  cefTRIAXone (ROCEPHIN) 1 g in sodium chloride 0.9 % 100 mL IVPB (1 g Intravenous New Bag/Given 10/10/20 2350)    ED Course  I have reviewed the triage vital signs and the nursing notes.  Pertinent labs & imaging results that were available during my care of the patient were reviewed by me and considered in my medical decision making  (see chart for details).  Clinical Course as of Oct 12 27  Tue Oct 10, 2020  2257 Normal except glucose high  Basic metabolic panel(!) [EW]  3220 Normal except hemoglobin low, leukocytes high, white count high, bacteria rare  Urinalysis, Routine w reflex microscopic Urine, Catheterized(!) [EW]  2359 CT Head Wo Contrast [EW]  Wed Oct 11, 2020  0003 Per radiologist cannot exclude progression of prior left frontal parietal infarct.  CT Head Wo Contrast [EW]    Clinical Course User Index [EW] Daleen Bo, MD   MDM Rules/Calculators/A&P                           Patient Vitals for the past 24 hrs:  BP Temp Temp src Pulse Resp SpO2 Height Weight  10/11/20 0000 120/71 -- -- 66 (!) 22 93 % -- --  10/10/20 2204 -- (!) 97.1 F (36.2 C) Rectal -- -- -- -- --  10/10/20 2130 112/64 -- -- 72 19 93 % -- --  10/10/20 2100 102/61 -- -- 70 13 91 % -- --  10/10/20 2046 122/67 97.7 F (36.5 C) Oral 76 16 95 % -- --  10/10/20 2045 122/67 -- -- 72 (!) 24 92 % -- --  10/10/20 2041 -- -- -- -- -- -- 5\' 6"  (1.676 m) 48 kg    11:35 PM Reevaluation with update and discussion. After initial assessment and treatment, an updated evaluation reveals somewhat more responsive with unintelligible words, to painful stimuli or verbal stimuli. Daleen Bo   Medical Decision Making:  This patient is presenting for evaluation of reported unresponsiveness for 10 minutes, which does require a range of treatment options, and is a complaint that involves a moderate risk of morbidity and mortality. The differential diagnoses include infection, metabolic disorder, sleepiness. I decided to review old records, and in summary elderly female being cared for in a skilled nursing facility, presenting for change in mental status from baseline for 10  minutes.  I obtained additional historical information from her grandson, Steward Ros she has Case, who is her POA.  He states that the patient is full code.  He states that  yesterday when he visited her at the skilled nursing facility she was able to talk to him.  He states that since she arrived there several weeks ago, she has been able to communicate with him when he sees her 3 or 4 times a week.  He states that sometimes her speech is garbled and she is difficult to understand.  He does not have any additional history about what happened today.  Clinical Laboratory Tests Ordered, included CBC, Metabolic panel and Urinalysis. Review indicates normal except possible urinary tract infection.  Urine culture ordered..   Critical Interventions-clinical evaluation, laboratory testing, observation and reassessment.  Treatment with IV fluids and Rocephin empirically for UTI  After These Interventions, the Patient was reevaluated and was found clinically unchanged, remaining with limited responsiveness to stimulation and questioning.  Clinical examination is nonfocal.  It is unclear if she has had another stroke, or progression of the prior.  It does not appear that she is sedated by medication.  Per discussion with the patient's grandson she is significantly changed from her baseline.  According to him she is full code.  Urinalysis abnormal, may be contributing to altered mental status.  She was treated with IV fluids and Rocephin.  Per radiologist, patient with possible acute on prior left frontoparietal infarct.  She will need MRI follow-up.  CRITICAL CARE-yes Performed by: Daleen Bo  Nursing Notes Reviewed/ Care Coordinated Applicable Imaging Reviewed Interpretation of Laboratory Data incorporated into ED treatment   11:59 PM-Consult complete with hospitalist. Patient case explained and discussed.  He agrees to admit patient for further evaluation and treatment, after teleneurology consultation. Call ended at 35:59 AM  Dr. Roxanne Mins to call back after teleneuro consultation to arrange the admission  Final Clinical Impression(s) / ED Diagnoses Final diagnoses:   Altered mental status, unspecified altered mental status type  Breast mass in female  Urinary tract infection without hematuria, site unspecified    Rx / DC Orders ED Discharge Orders    None       Daleen Bo, MD 10/11/20 0028

## 2020-10-10 NOTE — ED Notes (Signed)
Pts. Left and right breast are noted to be hard.

## 2020-10-11 ENCOUNTER — Observation Stay (HOSPITAL_BASED_OUTPATIENT_CLINIC_OR_DEPARTMENT_OTHER): Payer: Medicare Other

## 2020-10-11 ENCOUNTER — Observation Stay (HOSPITAL_COMMUNITY): Payer: Medicare Other

## 2020-10-11 DIAGNOSIS — R627 Adult failure to thrive: Secondary | ICD-10-CM | POA: Diagnosis not present

## 2020-10-11 DIAGNOSIS — E782 Mixed hyperlipidemia: Secondary | ICD-10-CM

## 2020-10-11 DIAGNOSIS — G9341 Metabolic encephalopathy: Secondary | ICD-10-CM

## 2020-10-11 DIAGNOSIS — I10 Essential (primary) hypertension: Secondary | ICD-10-CM | POA: Diagnosis not present

## 2020-10-11 DIAGNOSIS — I639 Cerebral infarction, unspecified: Secondary | ICD-10-CM | POA: Insufficient documentation

## 2020-10-11 DIAGNOSIS — I6389 Other cerebral infarction: Secondary | ICD-10-CM

## 2020-10-11 DIAGNOSIS — R739 Hyperglycemia, unspecified: Secondary | ICD-10-CM

## 2020-10-11 DIAGNOSIS — N390 Urinary tract infection, site not specified: Secondary | ICD-10-CM

## 2020-10-11 LAB — COMPREHENSIVE METABOLIC PANEL
ALT: 16 U/L (ref 0–44)
AST: 18 U/L (ref 15–41)
Albumin: 2.9 g/dL — ABNORMAL LOW (ref 3.5–5.0)
Alkaline Phosphatase: 87 U/L (ref 38–126)
Anion gap: 8 (ref 5–15)
BUN: 20 mg/dL (ref 8–23)
CO2: 29 mmol/L (ref 22–32)
Calcium: 10.2 mg/dL (ref 8.9–10.3)
Chloride: 103 mmol/L (ref 98–111)
Creatinine, Ser: 0.58 mg/dL (ref 0.44–1.00)
GFR, Estimated: >60 mL/min (ref >60)
Glucose, Bld: 107 mg/dL — ABNORMAL HIGH (ref 70–99)
Potassium: 3.6 mmol/L (ref 3.5–5.1)
Sodium: 140 mmol/L (ref 135–145)
Total Bilirubin: 0.3 mg/dL (ref 0.3–1.2)
Total Protein: 5.6 g/dL — ABNORMAL LOW (ref 6.5–8.1)

## 2020-10-11 LAB — ECHOCARDIOGRAM LIMITED
Area-P 1/2: 2.9 cm2
Height: 66 in
Weight: 1693.13 oz

## 2020-10-11 LAB — CBC
HCT: 38.8 % (ref 36.0–46.0)
Hemoglobin: 12.1 g/dL (ref 12.0–15.0)
MCH: 27.8 pg (ref 26.0–34.0)
MCHC: 31.2 g/dL (ref 30.0–36.0)
MCV: 89 fL (ref 80.0–100.0)
Platelets: 343 10*3/uL (ref 150–400)
RBC: 4.36 MIL/uL (ref 3.87–5.11)
RDW: 13.5 % (ref 11.5–15.5)
WBC: 10.1 10*3/uL (ref 4.0–10.5)
nRBC: 0 % (ref 0.0–0.2)

## 2020-10-11 LAB — RESPIRATORY PANEL BY RT PCR (FLU A&B, COVID)
Influenza A by PCR: NEGATIVE
Influenza B by PCR: NEGATIVE
SARS Coronavirus 2 by RT PCR: NEGATIVE

## 2020-10-11 LAB — APTT: aPTT: 26 seconds (ref 24–36)

## 2020-10-11 LAB — LIPID PANEL
Cholesterol: 136 mg/dL (ref 0–200)
HDL: 57 mg/dL (ref >40)
LDL Cholesterol: 64 mg/dL (ref 0–99)
Total CHOL/HDL Ratio: 2.4 RATIO
Triglycerides: 75 mg/dL (ref <150)
VLDL: 15 mg/dL (ref 0–40)

## 2020-10-11 LAB — MAGNESIUM: Magnesium: 1.8 mg/dL (ref 1.7–2.4)

## 2020-10-11 LAB — PROTIME-INR
INR: 1 (ref 0.8–1.2)
Prothrombin Time: 13.2 seconds (ref 11.4–15.2)

## 2020-10-11 LAB — PHOSPHORUS: Phosphorus: 3.8 mg/dL (ref 2.5–4.6)

## 2020-10-11 LAB — CBG MONITORING, ED: Glucose-Capillary: 104 mg/dL — ABNORMAL HIGH (ref 70–99)

## 2020-10-11 MED ORDER — PERFLUTREN LIPID MICROSPHERE
1.0000 mL | INTRAVENOUS | Status: AC | PRN
  Administered 2020-10-11: 3 mL via INTRAVENOUS
  Filled 2020-10-11: qty 10

## 2020-10-11 MED ORDER — SODIUM CHLORIDE 0.9 % IV SOLN
1.0000 g | INTRAVENOUS | Status: DC
  Administered 2020-10-11 – 2020-10-12 (×2): 1 g via INTRAVENOUS
  Filled 2020-10-11 (×2): qty 10

## 2020-10-11 MED ORDER — CLOPIDOGREL BISULFATE 75 MG PO TABS
75.0000 mg | ORAL_TABLET | Freq: Every day | ORAL | Status: DC
  Administered 2020-10-12 – 2020-10-13 (×2): 75 mg via ORAL
  Filled 2020-10-11 (×2): qty 1

## 2020-10-11 MED ORDER — ATORVASTATIN CALCIUM 40 MG PO TABS
40.0000 mg | ORAL_TABLET | Freq: Every day | ORAL | Status: DC
  Administered 2020-10-12 – 2020-10-13 (×2): 40 mg via ORAL
  Filled 2020-10-11 (×2): qty 1

## 2020-10-11 MED ORDER — ASPIRIN EC 81 MG PO TBEC
81.0000 mg | DELAYED_RELEASE_TABLET | Freq: Every day | ORAL | Status: DC
  Administered 2020-10-12 – 2020-10-13 (×2): 81 mg via ORAL
  Filled 2020-10-11 (×2): qty 1

## 2020-10-11 MED ORDER — SODIUM CHLORIDE 0.9 % IV SOLN: INTRAVENOUS | Status: DC

## 2020-10-11 NOTE — Progress Notes (Signed)
EEG complete - results pending 

## 2020-10-11 NOTE — ED Notes (Signed)
Pt family asked to speak to provider.  Dr. Michae Kava.

## 2020-10-11 NOTE — Evaluation (Signed)
Physical Therapy Evaluation Patient Details Name: Angelica Bell MRN: [id number] DOB: December 22, 1932 Today's Date: 10/11/2020   History of Present Illness  Angelica Bell is a 86 y.o. female with medical history significant for remote PE; chronic pain; recent stroke and hypertension who presents to the emergency department due to loss of consciousness.  Patient was unable to provide history possibly due to an underlying dementia, history was obtained from ED physician and ED medical record.  Per report, patient was reported to be unresponsive for about 68minutes at the nursing care facility, baseline mental status unknown at this time.     Clinical Impression  Patient functioning at baseline which is total assist for transfers, nonambulatory.  Plan:  Patient discharged from physical therapy to care of nursing for out of bed with mechanical lift daily as tolerated for length of stay.     Follow Up Recommendations SNF    Equipment Recommendations  None recommended by PT    Recommendations for Other Services       Precautions / Restrictions Precautions Precautions: Fall Restrictions Weight Bearing Restrictions: No      Mobility  Bed Mobility Overal bed mobility: Needs Assistance Bed Mobility: Supine to Sit     Supine to sit: Total assist     General bed mobility comments: unable to maintain long sitting balance  Transfers                    Ambulation/Gait                Stairs            Wheelchair Mobility    Modified Rankin (Stroke Patients Only)       Balance Overall balance assessment: Needs assistance Sitting-balance support: Bilateral upper extremity supported Sitting balance-Leahy Scale: Poor Sitting balance - Comments: long sitting in bed Postural control: Posterior lean                                   Pertinent Vitals/Pain Pain Assessment: Faces Faces Pain Scale: Hurts even more Pain Location: with  pressue to BLE Pain Descriptors / Indicators: Sore;Grimacing;Guarding    Home Living Family/patient expects to be discharged to:: Skilled nursing facility Living Arrangements: Other (Comment)                    Prior Function Level of Independence: Needs assistance   Gait / Transfers Assistance Needed: non-ambulator, assisted for transfers  ADL's / Homemaking Assistance Needed: assisted by SNF staff        Hand Dominance        Extremity/Trunk Assessment   Upper Extremity Assessment Upper Extremity Assessment: Defer to OT evaluation    Lower Extremity Assessment Lower Extremity Assessment: RLE deficits/detail;LLE deficits/detail RLE Deficits / Details: grossly 0/5 RLE: Unable to fully assess due to pain RLE Sensation: decreased light touch;decreased proprioception RLE Coordination: decreased fine motor;decreased gross motor LLE Deficits / Details: grossly -2/5 LLE Sensation: WNL LLE Coordination: decreased fine motor;decreased gross motor    Cervical / Trunk Assessment Cervical / Trunk Assessment: Kyphotic  Communication   Communication: No difficulties  Cognition Arousal/Alertness: Awake/alert Behavior During Therapy: Anxious Overall Cognitive Status: Within Functional Limits for tasks assessed  General Comments      Exercises     Assessment/Plan    PT Assessment Patent does not need any further PT services  PT Problem List         PT Treatment Interventions      PT Goals (Current goals can be found in the Care Plan section)  Acute Rehab PT Goals Patient Stated Goal: return to SNF PT Goal Formulation: With patient/family Time For Goal Achievement: 10/11/20 Potential to Achieve Goals: Good    Frequency     Barriers to discharge        Co-evaluation               AM-PAC PT "6 Clicks" Mobility  Outcome Measure Help needed turning from your back to your side while in a flat  bed without using bedrails?: A Lot Help needed moving from lying on your back to sitting on the side of a flat bed without using bedrails?: A Lot Help needed moving to and from a bed to a chair (including a wheelchair)?: Total Help needed standing up from a chair using your arms (e.g., wheelchair or bedside chair)?: Total Help needed to walk in hospital room?: Total Help needed climbing 3-5 steps with a railing? : Total 6 Click Score: 8    End of Session   Activity Tolerance: Patient limited by fatigue;Treatment limited secondary to agitation Patient left: in bed Nurse Communication: Mobility status PT Visit Diagnosis: Unsteadiness on feet (R26.81);Muscle weakness (generalized) (M62.81);Difficulty in walking, not elsewhere classified (R26.2);Hemiplegia and hemiparesis Hemiplegia - Right/Left: Right Hemiplegia - dominant/non-dominant: Dominant Hemiplegia - caused by: Cerebral infarction    Time: 9833-8250 PT Time Calculation (min) (ACUTE ONLY): 17 min   Charges:   PT Evaluation $PT Eval Low Complexity: 1 Low PT Treatments $Therapeutic Activity: 8-22 mins        3:40 PM, 10/11/20 Lonell Grandchild, MPT Physical Therapist with [facility] [phone] office [phone] mobile phone

## 2020-10-11 NOTE — Progress Notes (Signed)
Patient seen and examined. Admitted after midnight secondary to AMS/unresponsiveness. Patient remains confused and having difficulties following directions currently. Repeat MRI demonstrating increased in extent left frontoparietal white matter stroke (in comparison to MRI on 08/31/20), there is also small region in the anterior aspect of this infarct which suggest acute/early subacute component. No hemorrhagic changes, stable occipital findings from previous MRI and no changes on MRA findings. Patient is currently unsafe to take PO's. Stable VS appreciated, urine culture pending. Please refer to H7P written by Dr. Josephine Cables for further info/details on admission.  Barton Dubois MD [phone]

## 2020-10-11 NOTE — H&P (Signed)
History and Physical  Angelica Bell MHD:[id number] DOB: 1932/12/26 DOA: 10/10/2020  Referring physician: Delora Fuel, MD PCP: Heywood Bene, PA-C  Patient coming from: [facility]  Chief Complaint: Loss of consciousness  HPI: Angelica Bell is a 86 y.o. female with medical history significant for remote PE; chronic pain; recent stroke and hypertension who presents to the emergency department due to loss of consciousness.  Patient was unable to provide history possibly due to an underlying dementia, history was obtained from ED physician and ED medical record.  Per report, patient was reported to be unresponsive for about 44minutes at the nursing care facility, baseline mental status unknown at this time. She was recently admitted and discharged from 8/31-9/7 due to acute ischemic stroke involving the left frontotemporal insular and occipital temporal lobes.   ED Course:  In the emergency department, temperature was 97.51F, and vital signs were within normal range.  Work-up in the ED showed normal CBC and hyperglycemia.  Urinalysis was positive for small hemoglobin, large leukocytes and WBC > 50.  Respiratory panel for influenza A & B as well as SARS coronavirus 2 was negative.  CT head without contrast showed possible acute on chronic left frontoparietal infarction with no intracranial hemorrhage. Patient was treated with IV ceftriaxone due to presumed UTI, IV hydration of 500 mL was given.  Teleneurology was consulted with recommendation for MRI and EEG in the morning.  Review of Systems: This cannot be obtained at this time due to patient's current condition    Past Medical History:  Diagnosis Date  . Chronic back pain    with degenerative joint disease  . Chronic pain disorder    with narcotic management  . Dry eyes   . Frequent falls   . HOH (hard of hearing)   . Hypertension   . Multiple rib fractures 02/2020  . Osteoporosis   . PE (pulmonary embolism)     After hysterectomy in 1967  . Pneumonia   . Rhabdomyolysis   . Stroke [facility])    Past Surgical History:  Procedure Laterality Date  . ABDOMINAL HYSTERECTOMY    . APPENDECTOMY    . BREAST ENHANCEMENT SURGERY    . CATARACT EXTRACTION    . FEMUR IM NAIL Left 02/17/2017   Procedure: INTRAMEDULLARY (IM) NAIL FEMORAL;  Surgeon: Nicholes Stairs, MD;  Location: [facility];  Service: Orthopedics;  Laterality: Left;  . HIP ARTHROPLASTY Right 12/24/2015   Procedure: ARTHROPLASTY BIPOLAR HIP (HEMIARTHROPLASTY);  Surgeon: Netta Cedars, MD;  Location: [facility];  Service: Orthopedics;  Laterality: Right;  . TONSILLECTOMY      Social History:  reports that she quit smoking about 44 years ago. She quit after 13.00 years of use. She has never used smokeless tobacco. She reports that she does not drink alcohol and does not use drugs.   Allergies  Allergen Reactions  . Hctz [Hydrochlorothiazide]     Significant and rapid hyponatremia (TIH)  . Amoxicillin-Pot Clavulanate Hives and Diarrhea    Has patient had a PCN reaction causing immediate rash, facial/tongue/throat swelling, SOB or lightheadedness with hypotension: yes Has patient had a PCN reaction causing severe rash involving mucus membranes or skin necrosis: yes Has patient had a PCN reaction that required hospitalization : unknown Has patient had a PCN reaction occurring within the last 10 years: unknown If all of the above answers are "NO", then may proceed with Cephalosporin use.   Marland Kitchen Lisinopril-Hydrochlorothiazide Other (See Comments)    Tired- no energy to do  anything..  . Sulfa Antibiotics Hives and Nausea And Vomiting  . Sulfonamide Derivatives Hives    Family History  Problem Relation Age of Onset  . Heart Problems Mother    Prior to Admission medications   Medication Sig Start Date End Date Taking? Authorizing Provider  cholecalciferol (VITAMIN D) 25 MCG tablet Take 1 tablet (1,000 Units total) by mouth daily. 09/05/20  Yes Shelly Coss, MD  gabapentin (NEURONTIN) 100 MG capsule Take 1 capsule (100 mg total) by mouth 3 (three) times daily. Patient taking differently: Take 100 mg by mouth as needed (pain).  02/08/20  Yes Harold Hedge, MD  oxyCODONE (OXY IR/ROXICODONE) 5 MG immediate release tablet Take 1 tablet (5 mg total) by mouth every 6 (six) hours as needed for severe pain. 09/05/20  Yes Shelly Coss, MD  acetaminophen (TYLENOL) 500 MG tablet Take 500 mg by mouth every 6 (six) hours as needed for moderate pain.     [provider]  amLODipine (NORVASC) 2.5 MG tablet Take 1 tablet (2.5 mg total) by mouth daily. 05/18/20   Charlynne Cousins, MD  aspirin EC 81 MG EC tablet Take 1 tablet (81 mg total) by mouth daily. 09/15/19   Matcha, Beverely Pace, MD  atorvastatin (LIPITOR) 40 MG tablet Take 1 tablet (40 mg total) by mouth daily. 09/05/20   Shelly Coss, MD  clopidogrel (PLAVIX) 75 MG tablet Take 1 tablet (75 mg total) by mouth daily. Stop after 25 days 09/06/20   Shelly Coss, MD  donepezil (ARICEPT) 5 MG tablet Take 5 mg by mouth daily. 06/28/20   [provider]  escitalopram (LEXAPRO) 20 MG tablet Take 20 mg by mouth daily. 06/22/20   [provider]  feeding supplement, ENSURE ENLIVE, (ENSURE ENLIVE) LIQD Take 237 mLs by mouth 2 (two) times daily between meals. 03/10/20   Black, Lezlie Octave, NP  Multiple Vitamin (MULTIVITAMIN WITH MINERALS) TABS tablet Take 1 tablet by mouth daily. 03/11/20   Radene Gunning, NP    Physical Exam: BP 132/71   Pulse 66   Temp 98.1 F (36.7 C) (Oral)   Resp (!) 24   Ht 5\' 6"  (1.676 m)   Wt 48 kg   SpO2 91%   BMI 17.08 kg/m   . General: 86 y.o. year-old female well developed and in no acute distress.  Alert and oriented x 1.  . HEENT: NCAT, EOMI . Neck: Supple, trachea medial . Cardiovascular: Regular rate and rhythm with no rubs or gallops.  No thyromegaly or JVD noted.  No lower extremity edema. 2/4 pulses in all 4 extremities. Marland Kitchen Respiratory: Clear to  auscultation with no wheezes or rales. Good inspiratory effort. . Abdomen: Soft nontender nondistended with normal bowel sounds x4 quadrants. . Muskuloskeletal: No cyanosis, clubbing or edema noted bilaterally . Neuro: Patient mumbles some words (difficult to understand), she responds to some commands but was not completely cooperative for detailed neurological exam at this time.   . Skin: No ulcerative lesions noted or rashes . Psychiatry: This cannot be obtained at this time due to patient's current condition         Labs on Admission:  Basic Metabolic Panel: Recent Labs  Lab 10/10/20 2051  NA 135  K 3.7  CL 98  CO2 30  GLUCOSE 153*  BUN 23  CREATININE 0.83  CALCIUM 10.1   Liver Function Tests: No results for input(s): AST, ALT, ALKPHOS, BILITOT, PROT, ALBUMIN in the last 168 hours. No results for input(s): LIPASE, AMYLASE  in the last 168 hours. No results for input(s): AMMONIA in the last 168 hours. CBC: Recent Labs  Lab 10/10/20 2051  WBC 9.4  HGB 13.1  HCT 40.8  MCV 90.1  PLT 365   Cardiac Enzymes: No results for input(s): CKTOTAL, CKMB, CKMBINDEX, TROPONINI in the last 168 hours.  BNP (last 3 results) No results for input(s): BNP in the last 8760 hours.  ProBNP (last 3 results) No results for input(s): PROBNP in the last 8760 hours.  CBG: Recent Labs  Lab 10/10/20 2114  GLUCAP 130*    Radiological Exams on Admission: CT Head Wo Contrast  Result Date: 10/11/2020 CLINICAL DATA:  Neuro deficit, acute, stroke suspected. Less responsive. EXAM: CT HEAD WITHOUT CONTRAST TECHNIQUE: Contiguous axial images were obtained from the base of the skull through the vertex without intravenous contrast. COMPARISON:  MR head 08/31/2020, CT head 08/29/2020, CT head 05/12/2020. FINDINGS: Brain: Chronic right cerebellar infarction. Left frontoparietal white matter hypodensity consistent with interval evolution of recent subacute/chronic infarction seen on MR head 08/31/2020.  Interval development of gray-white matter differentiation within the left frontoparietal lobe may represent a superimposed acute component on a subacute/chronic infarction (2:18, 4:37). Patchy and confluent areas of decreased attenuation are noted throughout the deep and periventricular white matter of the cerebral hemispheres bilaterally, compatible with chronic microvascular ischemic disease. No parenchymal hemorrhage. No mass lesion. No extra-axial collection. No mass effect or midline shift. No hydrocephalus. Basilar cisterns are patent. Vascular: No hyperdense vessel. Skull: No acute fracture or focal lesion. Sinuses/Orbits: Paranasal sinuses and mastoid air cells are clear. The orbits are unremarkable. Other: None. IMPRESSION: 1. Possible acute on chronic left frontoparietal infarction. Consider MRI noncontrast for further evaluation. 2. No intracranial hemorrhage. These results were called by telephone at the time of interpretation on 10/11/2020 at 12:04 am to provider [facility] , who verbally acknowledged these results. Electronically Signed   By: Iven Finn M.D.   On: 10/11/2020 00:07    EKG: I independently viewed the EKG done and my findings are as followed: Sinus rhythm at rate of 73 bpm  Assessment/Plan Present on Admission: . UTI (urinary tract infection) . Acute CVA (cerebrovascular accident) ([place]) . Essential hypertension . Hyperlipidemia . Acute metabolic encephalopathy  Active Problems:   Hyperlipidemia   Essential hypertension   UTI (urinary tract infection)   Acute metabolic encephalopathy   Acute CVA (cerebrovascular accident) ([place])   Hyperglycemia  Acute metabolic encephalopathy possibly secondary to multifactorial, r/o acute on chronic left frontal parietal infarction vs toxic metabolic cause Patient will be admitted to telemetry unit  Bilateral carotid ultrasound in the morning Echocardiogram in the morning MRI of brain without contrast in the  morning Consider restarting aspirin and Plavix after MRI rules out intracranial hemorrhage Continue fall precautions and neuro checks Lipid panel will be done in the morning Hemoglobin A1c done on 9/2 was 6.0 Continue PT/ST/OT eval and treat Bedside swallow eval by nursing prior to diet EEG in the morning as part of work-up to rule out acute metabolic cause Limited echocardiogram in the morning.  Echocardiogram done on 9/5 showed Left ventricular ejection fraction, by estimation, is 60 to 65%. The left ventricle has normal function Tele neurology consult appreciated Neurology will be consulted for further recommendation in the morning  Presumed UTI POA Patient was presumed to have UTI and was started on IV ceftriaxone in the ED Urinalysis was unconvincing for UTI, however due to possible metabolic cause for patient's current symptoms, we shall continue with IV  ceftriaxone at this time with plan to de-escalate based on urine culture.  Hyperglycemia possibly reactive BG 153; Hemoglobin A1c done on 9/2 was 6.0 Continue to monitor blood glucose with morning labs.  Essential pretension Permissive hypertension will be allowed at this time  BP at 132/71 at this time.  Hyperlipidemia Continue Lipitor  Failure to thrive (BMI 17.08) Albumin level will be checked Consult dietitian for evaluation and management  DVT prophylaxis: SCDs  Code Status: Full code (patient was full code during last admission at [facility] from 8/31-9/7)  Family Communication: None at bedside  Disposition Plan:  Patient is from:                        [place] to:                 [facility] Anticipated DC date:                1 day Anticipated DC barriers:         Patient is unstable to be discharged at this time due to acute medical instability possibly secondary to acute on chronic ischemic stroke and presumed UTI requiring IV antibiotics at this time.     Consults called:  Telemetry neurology  by ED team Neurology  Admission status: Observation    Bernadette Hoit MD [facility]  10/11/2020, 3:27 AM

## 2020-10-11 NOTE — NC FL2 (Signed)
[place] MEDICAID FL2 LEVEL OF CARE SCREENING TOOL     IDENTIFICATION  Patient Name: Angelica Bell Birthdate: [birth year] Sex: female Admission Date (Current Location): 10/10/2020  [facility] and [place] Number:  [employer] and Address:  [place] [address] [place]      Provider Number: [id number]  Attending Physician Name and Address:  Barton Dubois, MD  Relative Name and Phone Number:       Current Level of Care: Hospital Recommended Level of Care: [place] Prior Approval Number:    Date Approved/Denied:   PASRR Number:    Discharge Plan: SNF    Current Diagnoses: Patient Active Problem List   Diagnosis Date Noted  . Acute ischemic stroke ([place]) 10/11/2020  . Hyperglycemia 10/11/2020  . Failure to thrive in adult 10/11/2020  . Palliative care by specialist   . Goals of care, counseling/discussion   . DNR (do not resuscitate) discussion   . Acute CVA (cerebrovascular accident) ([place]) 08/31/2020  . Acute metabolic encephalopathy 45/40/9811  . Acute respiratory failure with hypoxia ([place]) 05/17/2020  . Opioid overdose ([place]) 05/13/2020  . DNR (do not resuscitate) 03/09/2020  . Frequent falls 03/09/2020  . Multiple rib fractures 03/08/2020  . Common bile duct dilatation 03/08/2020  . Leukocytosis 03/08/2020  . Normocytic anemia 03/08/2020  . Hypomagnesemia 02/08/2020  . Fall 02/06/2020  . AKI (acute kidney injury) ([place]) 02/05/2020  . UTI (urinary tract infection) 09/01/2019  . Rhabdomyolysis 09/01/2019  . Shock ([place]) 08/31/2019  . Acute cystitis 08/03/2019  . Confusion and disorientation 08/03/2019  . Cerebrovascular accident (CVA) ([place])   . ARF (acute renal failure) ([place]) 07/26/2019  . Hyponatremia 07/26/2019  . Abnormal liver function 07/26/2019  . Peripheral neuropathy   . Drug overdose   . Polypharmacy   . Altered mental status 10/25/2017  . Closed displaced intertrochanteric fracture of  left femur ([place]) 02/16/2017  . Hip fracture, right ([place]) 12/25/2015  . Chronic pain 12/24/2015  . Fracture of femoral neck, right, closed ([place]) 12/24/2015  . Essential hypertension 12/24/2015  . Community acquired pneumonia 04/15/2013  . Hypokalemia 04/15/2013  . Labial cyst 02/08/2012  . POSTMENOPAUSAL SYNDROME 11/28/2009  . Mononeuritis of lower limb 02/22/2009  . INSOMNIA-SLEEP DISORDER-UNSPEC 10/12/2008  . Lumbago 08/02/2008  . 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 ANXIETY DEPRESSION 12/16/2007  . Hyperlipidemia 07/08/2007  . Osteoarthritis 07/08/2007  . Osteoporosis 07/08/2007  . SCOLIOSIS NEC 07/08/2007    Orientation RESPIRATION BLADDER Height & Weight     Self  Normal Incontinent Weight: 105 lb 13.1 oz (48 kg) Height:  5\' 6"  (167.6 cm)  BEHAVIORAL SYMPTOMS/MOOD NEUROLOGICAL BOWEL NUTRITION STATUS      Incontinent Diet (see dc summary)  AMBULATORY STATUS COMMUNICATION OF NEEDS Skin   Extensive Assist Verbally Normal                       Personal Care Assistance Level of Assistance  Bathing, Feeding, Dressing Bathing Assistance: Maximum assistance Feeding assistance: Maximum assistance Dressing Assistance: Maximum assistance     Functional Limitations Info  Sight, Hearing, Speech Sight Info: Adequate Hearing Info: Adequate Speech Info: Impaired    SPECIAL CARE FACTORS FREQUENCY                       Contractures Contractures Info: Not present    Additional Factors Info  Code Status, Allergies Code Status Info: Full Allergies Info: Hctz, Amoxicillin-pot Clavulanate, Lisinopril-hydrocholrothiazide, Sulfa  Antibiotics, Sulfonamide Derivatives           Current Medications (10/11/2020):  This is the current hospital active medication list Current Facility-Administered Medications  Medication Dose Route Frequency Provider Last Rate Last Admin  . aspirin EC tablet 81 mg  81 mg Oral Daily Barton Dubois, MD      . atorvastatin  (LIPITOR) tablet 40 mg  40 mg Oral Daily Adefeso, Oladapo, DO      . cefTRIAXone (ROCEPHIN) 1 g in sodium chloride 0.9 % 100 mL IVPB  1 g Intravenous Q24H Adefeso, Oladapo, DO      . clopidogrel (PLAVIX) tablet 75 mg  75 mg Oral Daily Barton Dubois, MD       Current Outpatient Medications  Medication Sig Dispense Refill  . acetaminophen (TYLENOL) 500 MG tablet Take 500 mg by mouth every 6 (six) hours as needed for moderate pain.     Marland Kitchen amLODipine (NORVASC) 2.5 MG tablet Take 1 tablet (2.5 mg total) by mouth daily.    Marland Kitchen aspirin EC 81 MG EC tablet Take 1 tablet (81 mg total) by mouth daily. 30 tablet 0  . atorvastatin (LIPITOR) 40 MG tablet Take 1 tablet (40 mg total) by mouth daily.    Marland Kitchen buPROPion (WELLBUTRIN) 75 MG tablet Take 75 mg by mouth daily.    . cholecalciferol (VITAMIN D) 25 MCG tablet Take 1 tablet (1,000 Units total) by mouth daily.    Marland Kitchen donepezil (ARICEPT) 5 MG tablet Take 5 mg by mouth daily.    Marland Kitchen escitalopram (LEXAPRO) 20 MG tablet Take 20 mg by mouth daily.    Marland Kitchen gabapentin (NEURONTIN) 100 MG capsule Take 1 capsule (100 mg total) by mouth 3 (three) times daily. (Patient taking differently: Take 100 mg by mouth as needed (pain). ) 90 capsule 0  . Multiple Vitamin (MULTIVITAMIN WITH MINERALS) TABS tablet Take 1 tablet by mouth daily.    . Nutritional Supplements (RESOURCE 2.0) LIQD Take 60 mLs by mouth in the morning, at noon, and at bedtime.    Marland Kitchen oxyCODONE (OXY IR/ROXICODONE) 5 MG immediate release tablet Take 1 tablet (5 mg total) by mouth every 6 (six) hours as needed for severe pain. 15 tablet 0  . clopidogrel (PLAVIX) 75 MG tablet Take 1 tablet (75 mg total) by mouth daily. Stop after 25 days (Patient not taking: Reported on 10/11/2020) 25 tablet 0     Discharge Medications: Please see discharge summary for a list of discharge medications.  Relevant Imaging Results:  Relevant Lab Results:   Additional Information    Shade Flood, LCSW

## 2020-10-11 NOTE — ED Notes (Signed)
Assumed care of pt at 0700 pt in Radiology

## 2020-10-11 NOTE — TOC Initial Note (Signed)
Transition of Care [facility]) - Initial/Assessment Note    Patient Details  Name: Angelica Bell MRN: [id number] Date of Birth: 1932/05/17  Transition of Care [facility]) CM/SW Contact:    Shade Flood, LCSW Phone Number: 10/11/2020, 2:10 PM  Clinical Narrative:                  Pt admitted observation status from [facility]. Spoke with Melissa at [facility] who states pt is in their long term care and they will accept her back at dc.  TOC will follow and assist as needed with dc planning.  Expected Discharge Plan: Long Term Nursing Home Barriers to Discharge: Continued Medical Work up   Patient Goals and CMS Choice        Expected Discharge Plan and Services Expected Discharge Plan: [place] In-house Referral: Clinical Social Work     Living arrangements for the past 2 months: [place]                                      Prior Living Arrangements/Services Living arrangements for the past 2 months: [place] Lives with:: Facility Resident Patient language and need for interpreter reviewed:: Yes Do you feel safe going back to the place where you live?: Yes      Need for Family Participation in Patient Care: No (Comment) Care giver support system in place?: Yes (comment)   Criminal Activity/Legal Involvement Pertinent to Current Situation/Hospitalization: No - Comment as needed  Activities of Daily Living      Permission Sought/Granted                  Emotional Assessment       Orientation: : Oriented to Self Alcohol / Substance Use: Not Applicable Psych Involvement: No (comment)  Admission diagnosis:  Acute ischemic stroke [facility]) [I63.9] Patient Active Problem List   Diagnosis Date Noted  . Acute ischemic stroke ([place]) 10/11/2020  . Hyperglycemia 10/11/2020  . Failure to thrive in adult 10/11/2020  . Palliative care by specialist   . Goals of care, counseling/discussion   . DNR (do not  resuscitate) discussion   . Acute CVA (cerebrovascular accident) ([place]) 08/31/2020  . Acute metabolic encephalopathy 88/82/8003  . Acute respiratory failure with hypoxia ([place]) 05/17/2020  . Opioid overdose ([place]) 05/13/2020  . DNR (do not resuscitate) 03/09/2020  . Frequent falls 03/09/2020  . Multiple rib fractures 03/08/2020  . Common bile duct dilatation 03/08/2020  . Leukocytosis 03/08/2020  . Normocytic anemia 03/08/2020  . Hypomagnesemia 02/08/2020  . Fall 02/06/2020  . AKI (acute kidney injury) ([place]) 02/05/2020  . UTI (urinary tract infection) 09/01/2019  . Rhabdomyolysis 09/01/2019  . Shock ([place]) 08/31/2019  . Acute cystitis 08/03/2019  . Confusion and disorientation 08/03/2019  . Cerebrovascular accident (CVA) ([place])   . ARF (acute renal failure) ([place]) 07/26/2019  . Hyponatremia 07/26/2019  . Abnormal liver function 07/26/2019  . Peripheral neuropathy   . Drug overdose   . Polypharmacy   . Altered mental status 10/25/2017  . Closed displaced intertrochanteric fracture of left femur ([place]) 02/16/2017  . Hip fracture, right ([place]) 12/25/2015  . Chronic pain 12/24/2015  . Fracture of femoral neck, right, closed ([place]) 12/24/2015  . Essential hypertension 12/24/2015  . Community acquired pneumonia 04/15/2013  . Hypokalemia 04/15/2013  . Labial cyst 02/08/2012  . POSTMENOPAUSAL SYNDROME 11/28/2009  . Mononeuritis of lower limb  02/22/2009  . INSOMNIA-SLEEP DISORDER-UNSPEC 10/12/2008  . Lumbago 08/02/2008  . 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 ANXIETY DEPRESSION 12/16/2007  . Hyperlipidemia 07/08/2007  . Osteoarthritis 07/08/2007  . Osteoporosis 07/08/2007  . SCOLIOSIS NEC 07/08/2007   PCP:  Heywood Bene, PA-C Pharmacy:   [facility], [place] - [address] [place] [address] Phone: [phone] Fax: [facility], [place] - [address] [place] [address] [place] [address] Phone: [phone] Fax: [phone]     Social Determinants of Health (SDOH) Interventions    Readmission Risk Interventions Readmission Risk Prevention Plan 05/15/2020  Transportation Screening Complete  PCP or Specialist Appt within 3-5 Days Complete  HRI or [place] Complete  Social Work Consult for [place] Planning/Counseling Complete  Palliative Care Screening Complete  Medication Review Press photographer) Complete  Some recent data might be hidden

## 2020-10-11 NOTE — ED Provider Notes (Signed)
Care assumed from Dr. Eulis Foster, patient was to be admitted pending teleneurology consult.  Teleneurology consult has been received and recommends some general supportive care.  Case is discussed with Dr. Josephine Cables of [facility], who agrees to admit the patient.   Delora Fuel, MD 29/92/42 [phone]

## 2020-10-11 NOTE — ED Notes (Signed)
Pt will not follow commands but moves extremities freely.

## 2020-10-11 NOTE — Progress Notes (Addendum)
SLP Cancellation Note  Patient Details Name: Angelica Bell MRN: [id number] DOB: [birth year]   Cancelled treatment:       Reason Eval/Treat Not Completed: Fatigue/lethargy limiting ability to participate;Patient's level of consciousness. Per RN Pt is very agitated during the brief periods when she is alert and refusing to consume anything provided. RN has been unable to complete the [employer] Screen secondary to mentation and lack of alertness. ST will hold to complete BSE until Pt is appropriate and swallow screen has been completed, thank you.  Aunya Lemler H. Roddie Mc, CCC-SLP Speech Language Pathologist    Wende Bushy 10/11/2020, 4:58 PM

## 2020-10-11 NOTE — Consult Note (Signed)
[facility] TeleNeurology Consult Services  Stat Consult  Date of Service:   10/11/2020 00:38:08  Impression:     .  G93.49 - Encephalopathy Multifactorial  Comments/Sign-Out: the patient has altered mental status worse than baseline. It sounds like she has dementia and had a left hemispheric ischemic sttroke on the last occasion. She probably participates with exam but does say some things. CT of the head could possibly show extension of her prior stroke MRI brain to clarify. Continue antiplatelet for now with inpatient neurology consultation. Consider routine EEG givenreport of unresponsiveness toxic metabolic workup per primary team.  CT HEAD:  1. Possible acute on chronic left frontoparietal infarction. Consider MRI noncontrast for further evaluation. 2. No intracranial hemorrhage.  Our recommendations are outlined below.  Diagnostic Studies: Recommend MRI brain without contrast  Laboratory Studies: Recommend Lipid panel Hemoglobin A1c  Medication: Aspirin Plavix  Nursing Recommendations: Telemetry, IV Fluids, avoid dextrose containing fluids, Maintain euglycemia Neuro checks q4 hrs x 24 hrs and then per shift Head of bed 30 degrees  Consultations: Recommend Speech therapy if failed dysphagia screen Physical therapy/Occupational therapy  DVT Prophylaxis: Choice of Primary Team  Disposition: Neurology will follow  Additional Recommendations:    Metrics: [facility] Notification Time: 10/11/2020 00:35:39 Stamp Time: 10/11/2020 00:38:08 Callback Response Time: 10/11/2020 00:37:28   ----------------------------------------------------------------------------------------------------  Chief Complaint: altered mental status  History of Present Illness: Patient is a 86 year old Female.  The patient is an elderly woman who comes from a nursing facility who went unresponsive for ten minutes. she has a history of pulmonary embolism hypertension and  recent admission in August for acute ischemic stroke involving the left frontotemporal insular and occipital temporal lobes. She does not have any carotid stenosis. Cardiac monitoring was recommended to exclude atrial fibrillation. Her LDL was only 85 with a hemoglobin A1c of 6. she has a history of dementia at baseline apparently. for this episode of unresponsiveness the patient had a CT of the head showing a possible acute on chronic left frontoparietal infarction MRI recommended.   Past Medical History:     . Hypertension     . Hyperlipidemia     . Stroke     . There is NO history of Diabetes Mellitus     . There is NO history of Atrial Fibrillation  Anticoagulant use:  No  Antiplatelet use: asa and plavix    Examination: BP(p), Pulse(p), Blood Glucose(p) 1A: Level of Consciousness - Requires repeated stimulation to arouse + 2 1B: Ask Month and Age - 1 Question Right + 1 1C: Blink Eyes & Squeeze Hands - Performs 1 Task + 1 2: Test Horizontal Extraocular Movements - Normal + 0 3: Test Visual Fields - No Visual Loss + 0 4: Test Facial Palsy (Use [place] if Obtunded) - Normal symmetry + 0 5A: Test Left Arm Motor Drift - No Drift for 10 Seconds + 0 5B: Test Right Arm Motor Drift - Drift, but doesn't hit bed + 1 6A: Test Left Leg Motor Drift - No Effort Against Gravity + 3 6B: Test Right Leg Motor Drift - No Effort Against Gravity + 3 7: Test Limb Ataxia (FNF/Heel-Shin) - No Ataxia + 0 8: Test Sensation - Normal; No sensory loss + 0 9: Test Language/Aphasia - Severe Aphasia: Fragmentary Expression, Inference Needed, Cannot Identify Materials + 2 10: Test Dysarthria - Mild-Moderate Dysarthria: Slurring but can be understood + 1 11: Test Extinction/Inattention - No abnormality + 0  NIHSS Score: 14   Patient/Family was informed  the Neurology Consult would occur via TeleHealth consult by way of interactive audio and video telecommunications and consented to receiving care in this  manner.  Patient is being evaluated for possible acute neurologic impairment and high probability of imminent or life-threatening deterioration. I spent total of 35 minutes providing care to this patient, including time for face to face visit via telemedicine, review of medical records, imaging studies and discussion of findings with providers, the patient and/or family.   Dr Katina Degree   [facility] [phone]  Case [id number]

## 2020-10-11 NOTE — Procedures (Signed)
Patient Name: Angelica Bell  MRN: [id number]  Epilepsy Attending: Lora Havens  Referring Physician/Provider: Dr Nicanor Bake Date: 10/11/2020 Duration: 27.47 mins  Patient history: 86 year old female with altered mental status.  EEG to evaluate for seizures.  Level of alertness: Awake  AEDs during EEG study: None  Technical aspects: This EEG study was done with scalp electrodes positioned according to the 10-20 International system of electrode placement. Electrical activity was acquired at a sampling rate of 500Hz  and reviewed with a high frequency filter of 70Hz  and a low frequency filter of 1Hz . EEG data were recorded continuously and digitally stored.   Description: The posterior dominant rhythm consists of 8 Hz activity of moderate voltage (25-35 uV) seen predominantly in posterior head regions, symmetric and reactive to eye opening and eye closing.  EEG showed intermittent generalized and maximal left frontotemporal region 2 to 3 Hz delta slowing.  Hyperventilation and photic stimulation were not performed.     ABNORMALITY -Intermittent slow, generalized and maximal left frontotemporal region  IMPRESSION: This study is suggestive of cortical dysfunction in left frontotemporal region consistent with underlying infarct as well mild diffuse encephalopathy, nonspecific etiology.  No seizures or epileptiform discharges were seen throughout the recording.  Derald Lorge Barbra Sarks

## 2020-10-11 NOTE — Progress Notes (Signed)
*  PRELIMINARY RESULTS* Echocardiogram 2D Echocardiogram limited with definity has been performed.  Leavy Cella 10/11/2020, 11:47 AM

## 2020-10-11 NOTE — ED Notes (Signed)
POA grandson requesting an update on status of pt.  Dr. Dyann Kief aware.

## 2020-10-12 ENCOUNTER — Encounter (HOSPITAL_COMMUNITY): Payer: Self-pay | Admitting: Internal Medicine

## 2020-10-12 ENCOUNTER — Other Ambulatory Visit: Payer: Self-pay

## 2020-10-12 DIAGNOSIS — G9341 Metabolic encephalopathy: Secondary | ICD-10-CM | POA: Diagnosis present

## 2020-10-12 DIAGNOSIS — R4182 Altered mental status, unspecified: Secondary | ICD-10-CM | POA: Diagnosis present

## 2020-10-12 DIAGNOSIS — H9190 Unspecified hearing loss, unspecified ear: Secondary | ICD-10-CM | POA: Diagnosis present

## 2020-10-12 DIAGNOSIS — G8929 Other chronic pain: Secondary | ICD-10-CM | POA: Diagnosis present

## 2020-10-12 DIAGNOSIS — Z1611 Resistance to penicillins: Secondary | ICD-10-CM | POA: Diagnosis present

## 2020-10-12 DIAGNOSIS — R627 Adult failure to thrive: Secondary | ICD-10-CM | POA: Diagnosis present

## 2020-10-12 DIAGNOSIS — Z86711 Personal history of pulmonary embolism: Secondary | ICD-10-CM | POA: Diagnosis not present

## 2020-10-12 DIAGNOSIS — E782 Mixed hyperlipidemia: Secondary | ICD-10-CM | POA: Diagnosis not present

## 2020-10-12 DIAGNOSIS — F32A Depression, unspecified: Secondary | ICD-10-CM | POA: Diagnosis present

## 2020-10-12 DIAGNOSIS — Z96641 Presence of right artificial hip joint: Secondary | ICD-10-CM | POA: Diagnosis present

## 2020-10-12 DIAGNOSIS — Z20822 Contact with and (suspected) exposure to covid-19: Secondary | ICD-10-CM | POA: Diagnosis present

## 2020-10-12 DIAGNOSIS — Z90710 Acquired absence of both cervix and uterus: Secondary | ICD-10-CM | POA: Diagnosis not present

## 2020-10-12 DIAGNOSIS — E43 Unspecified severe protein-calorie malnutrition: Secondary | ICD-10-CM | POA: Diagnosis present

## 2020-10-12 DIAGNOSIS — R29714 NIHSS score 14: Secondary | ICD-10-CM | POA: Diagnosis present

## 2020-10-12 DIAGNOSIS — E785 Hyperlipidemia, unspecified: Secondary | ICD-10-CM | POA: Diagnosis present

## 2020-10-12 DIAGNOSIS — I639 Cerebral infarction, unspecified: Secondary | ICD-10-CM | POA: Diagnosis present

## 2020-10-12 DIAGNOSIS — B9562 Methicillin resistant Staphylococcus aureus infection as the cause of diseases classified elsewhere: Secondary | ICD-10-CM | POA: Diagnosis present

## 2020-10-12 DIAGNOSIS — R296 Repeated falls: Secondary | ICD-10-CM | POA: Diagnosis present

## 2020-10-12 DIAGNOSIS — M810 Age-related osteoporosis without current pathological fracture: Secondary | ICD-10-CM | POA: Diagnosis present

## 2020-10-12 DIAGNOSIS — Z8673 Personal history of transient ischemic attack (TIA), and cerebral infarction without residual deficits: Secondary | ICD-10-CM | POA: Diagnosis not present

## 2020-10-12 DIAGNOSIS — Z681 Body mass index (BMI) 19 or less, adult: Secondary | ICD-10-CM | POA: Diagnosis not present

## 2020-10-12 DIAGNOSIS — N390 Urinary tract infection, site not specified: Secondary | ICD-10-CM | POA: Diagnosis present

## 2020-10-12 DIAGNOSIS — R739 Hyperglycemia, unspecified: Secondary | ICD-10-CM | POA: Diagnosis present

## 2020-10-12 DIAGNOSIS — Z66 Do not resuscitate: Secondary | ICD-10-CM | POA: Diagnosis present

## 2020-10-12 DIAGNOSIS — F0390 Unspecified dementia without behavioral disturbance: Secondary | ICD-10-CM | POA: Diagnosis present

## 2020-10-12 DIAGNOSIS — I10 Essential (primary) hypertension: Secondary | ICD-10-CM | POA: Diagnosis present

## 2020-10-12 LAB — GLUCOSE, CAPILLARY
Glucose-Capillary: 116 mg/dL — ABNORMAL HIGH (ref 70–99)
Glucose-Capillary: 188 mg/dL — ABNORMAL HIGH (ref 70–99)
Glucose-Capillary: 97 mg/dL (ref 70–99)

## 2020-10-12 MED ORDER — VITAMIN D 25 MCG (1000 UNIT) PO TABS
1000.0000 [IU] | ORAL_TABLET | Freq: Every day | ORAL | Status: DC
  Administered 2020-10-12 – 2020-10-13 (×2): 1000 [IU] via ORAL
  Filled 2020-10-12 (×2): qty 1

## 2020-10-12 MED ORDER — ENSURE ENLIVE PO LIQD
1.0000 | Freq: Three times a day (TID) | ORAL | Status: DC
  Administered 2020-10-12 – 2020-10-13 (×3): 237 mL via ORAL

## 2020-10-12 MED ORDER — MIRTAZAPINE 15 MG PO TABS
7.5000 mg | ORAL_TABLET | Freq: Every day | ORAL | Status: DC
  Administered 2020-10-12 – 2020-10-13 (×2): 7.5 mg via ORAL
  Filled 2020-10-12 (×2): qty 1

## 2020-10-12 MED ORDER — AMLODIPINE BESYLATE 5 MG PO TABS
2.5000 mg | ORAL_TABLET | Freq: Every day | ORAL | Status: DC
  Administered 2020-10-12 – 2020-10-13 (×2): 2.5 mg via ORAL
  Filled 2020-10-12 (×2): qty 1

## 2020-10-12 MED ORDER — ADULT MULTIVITAMIN W/MINERALS CH
1.0000 | ORAL_TABLET | Freq: Every day | ORAL | Status: DC
  Administered 2020-10-12 – 2020-10-13 (×2): 1 via ORAL
  Filled 2020-10-12: qty 1

## 2020-10-12 MED ORDER — ESCITALOPRAM OXALATE 10 MG PO TABS: 20.0000 mg | ORAL_TABLET | Freq: Every day | ORAL | Status: DC

## 2020-10-12 MED ORDER — BUPROPION HCL 75 MG PO TABS
75.0000 mg | ORAL_TABLET | Freq: Every day | ORAL | Status: DC
  Administered 2020-10-13: 75 mg via ORAL
  Filled 2020-10-12 (×4): qty 1

## 2020-10-12 MED ORDER — DONEPEZIL HCL 5 MG PO TABS
5.0000 mg | ORAL_TABLET | Freq: Every day | ORAL | Status: DC
  Administered 2020-10-12 – 2020-10-13 (×2): 5 mg via ORAL
  Filled 2020-10-12 (×2): qty 1

## 2020-10-12 MED ORDER — ACETAMINOPHEN 500 MG PO TABS
500.0000 mg | ORAL_TABLET | Freq: Four times a day (QID) | ORAL | Status: DC | PRN
  Administered 2020-10-13: 500 mg via ORAL
  Filled 2020-10-12: qty 1

## 2020-10-12 NOTE — Progress Notes (Signed)
PROGRESS NOTE    Angelica Bell  XBD:[id number] DOB: Jul 13, 1932 DOA: 10/10/2020 PCP: Heywood Bene, PA-C    Chief Complaint  Patient presents with  . Loss of Consciousness    Brief Narrative:  As per H&P written by Dr. Josephine Cables on 10/11/2020 HPI: Angelica Bell is a 85 y.o. female with medical history significant for remote PE; chronic pain; recent stroke and hypertension who presents to the emergency department due to loss of consciousness.  Patient was unable to provide history possibly due to an underlying dementia, history was obtained from ED physician and ED medical record.  Per report, patient was reported to be unresponsive for about 32minutes at the nursing care facility, baseline mental status unknown at this time. She was recently admitted and discharged from 8/31-9/7 due to acute ischemic stroke involving the left frontotemporal insular and occipital temporal lobes.   ED Course:  In the emergency department, temperature was 97.54F, and vital signs were within normal range.  Work-up in the ED showed normal CBC and hyperglycemia.  Urinalysis was positive for small hemoglobin, large leukocytes and WBC > 50.  Respiratory panel for influenza A & B as well as SARS coronavirus 2 was negative.  CT head without contrast showed possible acute on chronic left frontoparietal infarction with no intracranial hemorrhage. Patient was treated with IV ceftriaxone due to presumed UTI, IV hydration of 500 mL was given.  Teleneurology was consulted with recommendation for MRI and EEG in the morning.  Assessment & Plan: 1-acute/subacute CVA affecting left temporal region -Recent work-up for the same at [facility]:; Repeat images suggesting extension of previous infarcted area -No hemorrhagic changes -Continue aspirin and Plavix for secondary prevention -Continue statins -Dysphagia 3 diet with thin liquids as per speech therapy recommendations -PT recommending rehabilitation and  conditioning and a skilled nursing facility -No further episodes of unresponsiveness, lightheadedness, or new focal deficits. -Will follow final recommendations by neurology service. -EEG without epileptic wave abnormalities.  2-Hyperlipidemia -Continue statins  3-Essential hypertension -Allow permissive hypertension with a slow resumption of antihypertensive agents to further assist with secondary prevention of future strokes.  4-UTI (urinary tract infection): Urine culture demonstrating staph areas and Enterococcus faecalis microorganism -Sensitivity/susceptibility pending at this time -Continue IV Rocephin for now -Patient denies dysuria  5-Acute metabolic encephalopathy -In the setting of subacute CVA and UTI -Current mentation close to baseline according to grandson at baseline -Continue to provide constant reorientation and follow clinical response -Continue secondary prevention for stroke and antibiotic for UTI.  6-Failure to thrive in adult severe protein calorie malnutrition -Patient reports decreased appetite -Continue multivitamins on daily basis along with feeding supplements -Will add low-dose Remeron to assist with appetite.  7-dementia/depression -continue aricept and Wellbutrin  -no SI or halluicinations   DVT prophylaxis: SCDs Code Status: Full code Family Communication: Grandson at bedside Disposition:   Status is: Inpatient  Dispo: The patient is from: Skilled nursing facility              Anticipated d/c is to: Skilled nursing facility              Anticipated d/c date is: 1 day              Patient currently is medically stable for discharge; patient will require a skilled nursing facility for further care and rehabilitation.  Electroencephalogram no demonstrating epileptic wave abnormalities repeat stroke work-up suggesting mild extension of previous infarcted area in the left temporal region of her brain.  Will follow final  recommendations by neurology  service and he remained hemodynamically stable and tolerating diet will discharge back to skilled nursing facility on 10/13/2020.     Consultants:   Neurology   Procedures:  See below for x-ray reports EEG: No epileptic wave abnormality appreciated.   Antimicrobials:  Rocephin   Subjective: Afebrile, no nausea, no vomiting, no chest pain.  Feeling weak and deconditioned.  After being evaluated by speech therapy recommendation for dysphagia 3 diet with thin liquids has been provided.  Patient denies any shortness of breath.  Objective: Vitals:   10/12/20 0349 10/12/20 0502 10/12/20 0830 10/12/20 1343  BP:  (!) 152/91 137/80 124/84  Pulse:  94 85 81  Resp:  18 18 18   Temp:  98.5 F (36.9 C) 97.7 F (36.5 C) 98.3 F (36.8 C)  TempSrc:   Oral Oral  SpO2:  95% 95% 98%  Weight: 44 kg     Height: 5\' 6"  (1.676 m)       Intake/Output Summary (Last 24 hours) at 10/12/2020 1657 Last data filed at 10/12/2020 1200 Gross per 24 hour  Intake 900 ml  Output --  Net 900 ml   Filed Weights   10/10/20 2041 10/12/20 0349  Weight: 48 kg 44 kg    Examination:  General exam: Appears calm and comfortable; oriented x2 and able to answer questions appropriately.  Denies chest pain, no nausea, no vomiting, no headaches.  Feeling weak and deconditioned. Respiratory system: Clear to auscultation. Respiratory effort normal.  No requiring oxygen supplementation. Cardiovascular system: S1 & S2 heard, RRR. No JVD, rubs, gallops or clicks. No pedal edema. Gastrointestinal system: Abdomen is nondistended, soft and nontender. No organomegaly or masses felt. Normal bowel sounds heard. Central nervous system: Alert and oriented. No new focal neurological deficits. Extremities: Cyanosis or clubbing; no edema. Skin: No rashes, no petechiae Psychiatry: Mood & affect appropriate.     Data Reviewed: I have personally reviewed following labs and imaging studies  CBC: Recent Labs  Lab  10/10/20 2051 10/11/20 0450  WBC 9.4 10.1  HGB 13.1 12.1  HCT 40.8 38.8  MCV 90.1 89.0  PLT 365 272    Basic Metabolic Panel: Recent Labs  Lab 10/10/20 2051 10/11/20 0450  NA 135 140  K 3.7 3.6  CL 98 103  CO2 30 29  GLUCOSE 153* 107*  BUN 23 20  CREATININE 0.83 0.58  CALCIUM 10.1 10.2  MG  --  1.8  PHOS  --  3.8    GFR: Estimated Creatinine Clearance: 35.1 mL/min (by C-G formula based on SCr of 0.58 mg/dL).  Liver Function Tests: Recent Labs  Lab 10/11/20 0450  AST 18  ALT 16  ALKPHOS 87  BILITOT 0.3  PROT 5.6*  ALBUMIN 2.9*    CBG: Recent Labs  Lab 10/10/20 2114 10/11/20 1749 10/12/20 1151  GLUCAP 130* 104* 97     Recent Results (from the past 240 hour(s))  Urine culture     Status: Abnormal (Preliminary result)   Collection Time: 10/10/20 10:59 PM   Specimen: Urine, Catheterized  Result Value Ref Range Status   Specimen Description   Final    URINE, CATHETERIZED Performed at [facility], [address] [place], [place] [address]    Special Requests   Final    NONE Performed at [facility], [address] [place], [place] [address]    Culture (A)  Final    60,000 COLONIES/mL STAPHYLOCOCCUS AUREUS 30,000 COLONIES/mL ENTEROCOCCUS FAECALIS SUSCEPTIBILITIES TO FOLLOW Performed at [facility], [place] [address], [place], [place] [address]    Report Status PENDING  Incomplete  Respiratory Panel by RT PCR (Flu A&B, Covid) - Nasopharyngeal Swab     Status: None   Collection Time: 10/10/20 11:20 PM   Specimen: Nasopharyngeal Swab  Result Value Ref Range Status   SARS Coronavirus 2 by RT PCR NEGATIVE NEGATIVE Final    Comment: (NOTE) SARS-CoV-2 target nucleic acids are NOT DETECTED.  The SARS-CoV-2 RNA is generally detectable in upper respiratoy specimens during the acute phase of infection. The lowest concentration of SARS-CoV-2 viral copies this assay can detect is 131 copies/mL. A negative result does not preclude  SARS-Cov-2 infection and should not be used as the sole basis for treatment or other patient management decisions. A negative result may occur with  improper specimen collection/handling, submission of specimen other than nasopharyngeal swab, presence of viral mutation(s) within the areas targeted by this assay, and inadequate number of viral copies (<131 copies/mL). A negative result must be combined with clinical observations, patient history, and epidemiological information. The expected result is Negative.  Fact Sheet for Patients:  PinkCheek.be  Fact Sheet for Healthcare Providers:  GravelBags.it  This test is no t yet approved or cleared by the [place] FDA and  has been authorized for detection and/or diagnosis of SARS-CoV-2 by FDA under an Emergency Use Authorization (EUA). This EUA will remain  in effect (meaning this test can be used) for the duration of the COVID-19 declaration under Section 564(b)(1) of the Act, 21 U.S.C. section 360bbb-3(b)(1), unless the authorization is terminated or revoked sooner.     Influenza A by PCR NEGATIVE NEGATIVE Final   Influenza B by PCR NEGATIVE NEGATIVE Final    Comment: (NOTE) The Xpert Xpress SARS-CoV-2/FLU/RSV assay is intended as an aid in  the diagnosis of influenza from Nasopharyngeal swab specimens and  should not be used as a sole basis for treatment. Nasal washings and  aspirates are unacceptable for Xpert Xpress SARS-CoV-2/FLU/RSV  testing.  Fact Sheet for Patients: PinkCheek.be  Fact Sheet for Healthcare Providers: GravelBags.it  This test is not yet approved or cleared by the [place] FDA and  has been authorized for detection and/or diagnosis of SARS-CoV-2 by  FDA under an Emergency Use Authorization (EUA). This EUA will remain  in effect (meaning this test can be used) for the duration of the   Covid-19 declaration under Section 564(b)(1) of the Act, 21  U.S.C. section 360bbb-3(b)(1), unless the authorization is  terminated or revoked. Performed at [facility], [address], [place], [place] [address]      Radiology Studies: CT Head Wo Contrast  Result Date: 10/11/2020 CLINICAL DATA:  Neuro deficit, acute, stroke suspected. Less responsive. EXAM: CT HEAD WITHOUT CONTRAST TECHNIQUE: Contiguous axial images were obtained from the base of the skull through the vertex without intravenous contrast. COMPARISON:  MR head 08/31/2020, CT head 08/29/2020, CT head 05/12/2020. FINDINGS: Brain: Chronic right cerebellar infarction. Left frontoparietal white matter hypodensity consistent with interval evolution of recent subacute/chronic infarction seen on MR head 08/31/2020. Interval development of gray-white matter differentiation within the left frontoparietal lobe may represent a superimposed acute component on a subacute/chronic infarction (2:18, 4:37). Patchy and confluent areas of decreased attenuation are noted throughout the deep and periventricular white matter of the cerebral hemispheres bilaterally, compatible with chronic microvascular ischemic disease. No parenchymal hemorrhage. No mass lesion. No extra-axial collection. No mass effect or midline shift. No hydrocephalus. Basilar cisterns are patent. Vascular: No  hyperdense vessel. Skull: No acute fracture or focal lesion. Sinuses/Orbits: Paranasal sinuses and mastoid air cells are clear. The orbits are unremarkable. Other: None. IMPRESSION: 1. Possible acute on chronic left frontoparietal infarction. Consider MRI noncontrast for further evaluation. 2. No intracranial hemorrhage. These results were called by telephone at the time of interpretation on 10/11/2020 at 12:04 am to provider [facility] , who verbally acknowledged these results. Electronically Signed   By: Iven Finn M.D.   On: 10/11/2020 00:07   MR ANGIO HEAD WO  CONTRAST  Result Date: 10/11/2020 CLINICAL DATA:  Neuro deficit, acute, stroke suspected. Altered mental status, possible stroke, history of stroke in September. EXAM: MRI HEAD WITHOUT CONTRAST MRA HEAD WITHOUT CONTRAST TECHNIQUE: Multiplanar, multiecho pulse sequences of the brain and surrounding structures were obtained without intravenous contrast. Angiographic images of the head were obtained using MRA technique without contrast. COMPARISON:  Head CT 12/10/2020, head CT 10/10/2020, MRA head 08/31/2020, MRI head 08/31/2020. FINDINGS: MRI HEAD FINDINGS Brain: The examination is intermittently motion degraded, limiting evaluation. Most notably, there is moderate motion degradation of the axial diffusion-weighted sequence. Stable, mild generalized cerebral atrophy. There is diffusion weighted and T2/FLAIR hyperintensity within the left frontoparietal white matter which has increased in extent since the prior examination of 08/31/2020, now measuring 3.4 x 3.4 cm in transaxial dimensions. There is predominantly corresponding intermediate signal or T2 shine through on the [facility] map and this predominantly reflects late subacute to chronic infarction. However, there is a small region of true strict diffusion anteriorly which may reflect acute/early subacute infarction (series 6, image 32). There are adjacent late subacute to chronic cortical infarction changes within the left frontoparietal lobe, posterior left insula and left frontal operculum which have not significantly changed in extent since the prior MRI of 08/31/2020 (these infarcts were acute at that time). There is gyriform T1 hyperintensity at this site consistent with cortical laminar necrosis. Minimal chronic blood products are also present at this site. Redemonstrated chronic lacunar infarcts within the bilateral cerebral white matter and right basal ganglia. Redemonstrated chronic infarcts within the right cerebellum. Stable background moderate multifocal  T2/FLAIR hyperintensity within the cerebral white matter which is nonspecific, but consistent with chronic small vessel ischemic disease. To a lesser degree, chronic small vessel ischemic changes are also present within the pons. There are a few scattered supratentorial and infratentorial chronic microhemorrhages, nonspecific. Incidentally noted, there is a 5 mm extra-axial dural-based mass overlying the anterior right frontal lobe which likely reflects a small meningioma. No extra-axial fluid collection. No midline shift. Vascular: Intracranial arterial circulation reported below. Unchanged from the prior MRI of 08/31/2020, there is T2/FLAIR hyperintensity within the left transverse and sigmoid dural venous sinuses. Skull and upper cervical spine: No focal marrow lesion. Sinuses/Orbits: Visualized orbits show no acute finding. Minimal ethmoid sinus mucosal thickening. No significant mastoid effusion. MRA HEAD FINDINGS The examination is motion degraded at the level of the skull base. The intracranial internal carotid arteries are patent. The M1 middle cerebral arteries are patent without significant stenosis. No M2 proximal branch occlusion is identified. Unchanged, there is a moderate/severe focal stenosis within a distal inferior division M2 left MCA branch vessel (series 104, image 5). Also unchanged, there are multiple moderate/severe stenoses within a small proximal to mid superior division left M2 MCA branch vessel (series 104, image 8). The anterior cerebral arteries are patent. The right A1 segment is hypoplastic or absent. The intracranial vertebral arteries are patent. The basilar artery is patent. The posterior cerebral arteries are  patent. Posterior communicating arteries are hypoplastic or absent bilaterally. No intracranial aneurysm is identified. These results will be called to the ordering clinician or representative by the Radiologist Assistant, and communication documented in the PACS or [employer]. IMPRESSION: MRI brain: 1. Motion degraded examination. 2. 3.4 x 3.4 cm infarction within the left frontoparietal white matter which has increased in extent since the prior MRI of 08/31/2020, but appears predominantly late subacute to chronic. There is a small region at the anterior aspect of this infarct which demonstrates true restricted diffusion and may reflect an acute/early subacute component. 3. Adjacent late subacute to chronic cortically based infarction changes within the left frontoparietal lobes, posterior left insula and left frontal operculum which have not significantly changed in extent since the prior MRI. 4. Stable background mild generalized parenchymal atrophy and moderate chronic small vessel ischemic disease. Redemonstrated chronic infarcts within the bilateral cerebral white matter, right basal ganglia and right cerebellum. 5. Unchanged from the prior MRI, there is T2/FLAIR hyperintense signal abnormality within the left transverse and sigmoid dural venous sinuses. This may be due to slow flow. However, a dural venous sinus thrombosis cannot be excluded. Consider CT or MR venography for further evaluation. 6. Incidentally noted 5 mm meningioma overlying the right frontal convexity. MRA head: 1. Motion degraded examination. 2. No intracranial large vessel occlusion. 3. Unchanged moderate/severe focal stenosis within a distal inferior division M2 left MCA branch vessel. 4. Unchanged sites of moderate/severe stenosis within a proximal to mid small superior division M2 left MCA branch vessel. Electronically Signed   By: Kellie Simmering DO   On: 10/11/2020 08:09   MR BRAIN WO CONTRAST  Result Date: 10/11/2020 CLINICAL DATA:  Neuro deficit, acute, stroke suspected. Altered mental status, possible stroke, history of stroke in September. EXAM: MRI HEAD WITHOUT CONTRAST MRA HEAD WITHOUT CONTRAST TECHNIQUE: Multiplanar, multiecho pulse sequences of the brain and surrounding structures were  obtained without intravenous contrast. Angiographic images of the head were obtained using MRA technique without contrast. COMPARISON:  Head CT 12/10/2020, head CT 10/10/2020, MRA head 08/31/2020, MRI head 08/31/2020. FINDINGS: MRI HEAD FINDINGS Brain: The examination is intermittently motion degraded, limiting evaluation. Most notably, there is moderate motion degradation of the axial diffusion-weighted sequence. Stable, mild generalized cerebral atrophy. There is diffusion weighted and T2/FLAIR hyperintensity within the left frontoparietal white matter which has increased in extent since the prior examination of 08/31/2020, now measuring 3.4 x 3.4 cm in transaxial dimensions. There is predominantly corresponding intermediate signal or T2 shine through on the [facility] map and this predominantly reflects late subacute to chronic infarction. However, there is a small region of true strict diffusion anteriorly which may reflect acute/early subacute infarction (series 6, image 32). There are adjacent late subacute to chronic cortical infarction changes within the left frontoparietal lobe, posterior left insula and left frontal operculum which have not significantly changed in extent since the prior MRI of 08/31/2020 (these infarcts were acute at that time). There is gyriform T1 hyperintensity at this site consistent with cortical laminar necrosis. Minimal chronic blood products are also present at this site. Redemonstrated chronic lacunar infarcts within the bilateral cerebral white matter and right basal ganglia. Redemonstrated chronic infarcts within the right cerebellum. Stable background moderate multifocal T2/FLAIR hyperintensity within the cerebral white matter which is nonspecific, but consistent with chronic small vessel ischemic disease. To a lesser degree, chronic small vessel ischemic changes are also present within the pons. There are a few scattered supratentorial and infratentorial chronic microhemorrhages,  nonspecific.  Incidentally noted, there is a 5 mm extra-axial dural-based mass overlying the anterior right frontal lobe which likely reflects a small meningioma. No extra-axial fluid collection. No midline shift. Vascular: Intracranial arterial circulation reported below. Unchanged from the prior MRI of 08/31/2020, there is T2/FLAIR hyperintensity within the left transverse and sigmoid dural venous sinuses. Skull and upper cervical spine: No focal marrow lesion. Sinuses/Orbits: Visualized orbits show no acute finding. Minimal ethmoid sinus mucosal thickening. No significant mastoid effusion. MRA HEAD FINDINGS The examination is motion degraded at the level of the skull base. The intracranial internal carotid arteries are patent. The M1 middle cerebral arteries are patent without significant stenosis. No M2 proximal branch occlusion is identified. Unchanged, there is a moderate/severe focal stenosis within a distal inferior division M2 left MCA branch vessel (series 104, image 5). Also unchanged, there are multiple moderate/severe stenoses within a small proximal to mid superior division left M2 MCA branch vessel (series 104, image 8). The anterior cerebral arteries are patent. The right A1 segment is hypoplastic or absent. The intracranial vertebral arteries are patent. The basilar artery is patent. The posterior cerebral arteries are patent. Posterior communicating arteries are hypoplastic or absent bilaterally. No intracranial aneurysm is identified. These results will be called to the ordering clinician or representative by the Radiologist Assistant, and communication documented in the PACS or [employer]. IMPRESSION: MRI brain: 1. Motion degraded examination. 2. 3.4 x 3.4 cm infarction within the left frontoparietal white matter which has increased in extent since the prior MRI of 08/31/2020, but appears predominantly late subacute to chronic. There is a small region at the anterior aspect of this infarct  which demonstrates true restricted diffusion and may reflect an acute/early subacute component. 3. Adjacent late subacute to chronic cortically based infarction changes within the left frontoparietal lobes, posterior left insula and left frontal operculum which have not significantly changed in extent since the prior MRI. 4. Stable background mild generalized parenchymal atrophy and moderate chronic small vessel ischemic disease. Redemonstrated chronic infarcts within the bilateral cerebral white matter, right basal ganglia and right cerebellum. 5. Unchanged from the prior MRI, there is T2/FLAIR hyperintense signal abnormality within the left transverse and sigmoid dural venous sinuses. This may be due to slow flow. However, a dural venous sinus thrombosis cannot be excluded. Consider CT or MR venography for further evaluation. 6. Incidentally noted 5 mm meningioma overlying the right frontal convexity. MRA head: 1. Motion degraded examination. 2. No intracranial large vessel occlusion. 3. Unchanged moderate/severe focal stenosis within a distal inferior division M2 left MCA branch vessel. 4. Unchanged sites of moderate/severe stenosis within a proximal to mid small superior division M2 left MCA branch vessel. Electronically Signed   By: Kellie Simmering DO   On: 10/11/2020 08:09   US Carotid Bilateral  Result Date: 10/12/2020 CLINICAL DATA:  Syncope and recent stroke. EXAM: BILATERAL CAROTID DUPLEX ULTRASOUND TECHNIQUE: Pearline Cables scale imaging, color Doppler and duplex ultrasound were performed of bilateral carotid and vertebral arteries in the neck. COMPARISON:  Report from a prior study performed at [facility] on 09/01/2020 FINDINGS: Criteria: Quantification of carotid stenosis is based on velocity parameters that correlate the residual internal carotid diameter with NASCET-based stenosis levels, using the diameter of the distal internal carotid lumen as the denominator for stenosis measurement. The following  velocity measurements were obtained: RIGHT ICA:  69/17 cm/sec CCA:  95/62 cm/sec SYSTOLIC ICA/CCA RATIO:  1.0 ECA:  94 cm/sec LEFT ICA:  91/24 cm/sec CCA:  13/08 cm/sec SYSTOLIC ICA/CCA RATIO:  1.4 ECA:  78 cm/sec RIGHT CAROTID ARTERY: Mild amount of partially calcified plaque is present at the level of the carotid bulb and extending into the proximal right ICA. Estimated right ICA stenosis is less than 50%. RIGHT VERTEBRAL ARTERY: Antegrade flow with normal waveform and velocity. LEFT CAROTID ARTERY: There is a mild amount of partially calcified plaque at the level of the left carotid bulb and proximal left ICA. Estimated left ICA stenosis is less than 50%. LEFT VERTEBRAL ARTERY: Antegrade flow with normal waveform and velocity. IMPRESSION: Mild amount of plaque at the level of bilateral carotid bulbs and proximal internal carotid arteries. No significant carotid stenosis identified with estimated bilateral ICA stenoses of less than 50%. Electronically Signed   By: Aletta Edouard M.D.   On: 10/12/2020 08:36   EEG adult  Result Date: 10/11/2020 Lora Havens, MD     10/11/2020  2:11 PM Patient Name: Natesha Hassey MRN: [id number] Epilepsy Attending: Lora Havens Referring Physician/Provider: Dr Nicanor Bake Date: 10/11/2020 Duration: 27.47 mins Patient history: 86 year old female with altered mental status.  EEG to evaluate for seizures. Level of alertness: Awake AEDs during EEG study: None Technical aspects: This EEG study was done with scalp electrodes positioned according to the 10-20 International system of electrode placement. Electrical activity was acquired at a sampling rate of 500Hz  and reviewed with a high frequency filter of 70Hz  and a low frequency filter of 1Hz . EEG data were recorded continuously and digitally stored. Description: The posterior dominant rhythm consists of 8 Hz activity of moderate voltage (25-35 uV) seen predominantly in posterior head regions, symmetric and  reactive to eye opening and eye closing.  EEG showed intermittent generalized and maximal left frontotemporal region 2 to 3 Hz delta slowing.  Hyperventilation and photic stimulation were not performed.   ABNORMALITY -Intermittent slow, generalized and maximal left frontotemporal region IMPRESSION: This study is suggestive of cortical dysfunction in left frontotemporal region consistent with underlying infarct as well mild diffuse encephalopathy, nonspecific etiology.  No seizures or epileptiform discharges were seen throughout the recording. Lora Havens   ECHOCARDIOGRAM LIMITED  Result Date: 10/11/2020    ECHOCARDIOGRAM LIMITED REPORT   Patient Name:   CONCHETTA LAMIA [facility] Date of Exam: 10/11/2020 Medical Rec #:  [id number]          Height:       66.0 in Accession #:    [id number]         Weight:       105.8 lb Date of Birth:  1932/04/18          BSA:          1.526 m Patient Age:    10 years           BP:           109/67 mmHg Patient Gender: F                  HR:           68 bpm. Exam Location:  [facility] Procedure: Intracardiac Opacification Agent and Limited Echo Indications:    Stroke 434.91 / I163.9  History:        Patient has prior history of Echocardiogram examinations, most                 recent 09/03/2020. Stroke; Risk Factors:Dyslipidemia, Hypertension                 and Former Smoker. Opiod Overdose, Confusion.  Sonographer:    Leavy Cella RDCS (AE) Referring Phys: [id number] [facility] A[facility]O  Sonographer Comments: Image acquisition challenging due to breast implants and Image acquisition challenging due to uncooperative patient. IMPRESSIONS  1. Very limited images.  2. Left ventricular ejection fraction, by estimation, is 55 to 60%. The left ventricle has normal function. Left ventricular endocardial border not optimally defined to evaluate regional wall motion.  3. The mitral valve is abnormal, mildly thickened and calcified and with mild to moderate annular calcification. Trivial mitral  valve regurgitation. FINDINGS  Left Ventricle: Left ventricular ejection fraction, by estimation, is 55 to 60%. The left ventricle has normal function. Left ventricular endocardial border not optimally defined to evaluate regional wall motion. Definity contrast agent was given IV to delineate the left ventricular endocardial borders. Mitral Valve: The mitral valve is abnormal. There is mild thickening of the mitral valve leaflet(s). There is mild calcification of the mitral valve leaflet(s). Mild to moderate mitral annular calcification. Trivial mitral valve regurgitation. Tricuspid Valve: The tricuspid valve is not well visualized. Tricuspid valve regurgitation is trivial. MITRAL VALVE MV Area (PHT): 2.90 cm MV Decel Time: 262 msec MV E velocity: 52.30 cm/s MV A velocity: 94.30 cm/s MV E/A ratio:  0.55 Rozann Lesches MD Electronically signed by Rozann Lesches MD Signature Date/Time: 10/11/2020/1:22:38 PM    Final     Scheduled Meds: . aspirin EC  81 mg Oral Daily  . atorvastatin  40 mg Oral Daily  . clopidogrel  75 mg Oral Daily   Continuous Infusions: . sodium chloride 50 mL/hr at 10/11/20 1648  . cefTRIAXone (ROCEPHIN)  IV 1 g (10/11/20 2355)     LOS: 0 days    Time spent: 30 minutes   Barton Dubois, MD [facility]   To contact the attending provider between 7A-7P or the covering provider during after hours 7P-7A, please log into the web site [email or web address] and access using universal [place] password for that web site. If you do not have the password, please call the hospital operator.  10/12/2020, 4:57 PM

## 2020-10-12 NOTE — Evaluation (Signed)
Clinical/Bedside Swallow Evaluation Patient Details  Name: Angelica Bell MRN: [id number] Date of Birth: 03/06/1932  Today's Date: 10/12/2020 Time: SLP Start Time (ACUTE ONLY): 3710 SLP Stop Time (ACUTE ONLY): 0854 SLP Time Calculation (min) (ACUTE ONLY): 30 min  Past Medical History:  Past Medical History:  Diagnosis Date  . Chronic back pain    with degenerative joint disease  . Chronic pain disorder    with narcotic management  . Dry eyes   . Frequent falls   . HOH (hard of hearing)   . Hypertension   . Multiple rib fractures 02/2020  . Osteoporosis   . PE (pulmonary embolism)    After hysterectomy in 1967  . Pneumonia   . Rhabdomyolysis   . Stroke [facility])    Past Surgical History:  Past Surgical History:  Procedure Laterality Date  . ABDOMINAL HYSTERECTOMY    . APPENDECTOMY    . BREAST ENHANCEMENT SURGERY    . CATARACT EXTRACTION    . FEMUR IM NAIL Left 02/17/2017   Procedure: INTRAMEDULLARY (IM) NAIL FEMORAL;  Surgeon: Nicholes Stairs, MD;  Location: [place];  Service: Orthopedics;  Laterality: Left;  . HIP ARTHROPLASTY Right 12/24/2015   Procedure: ARTHROPLASTY BIPOLAR HIP (HEMIARTHROPLASTY);  Surgeon: Netta Cedars, MD;  Location: [facility];  Service: Orthopedics;  Laterality: Right;  . TONSILLECTOMY     HPI:  Angelica Bell is a 86 y.o. female with medical history significant for remote PE; chronic pain; recent stroke (08/2020 and seen at [facility]) and hypertension who presents to the emergency department due to loss of consciousness.  Patient was unable to provide history possibly due to an underlying dementia, history was obtained from ED physician and ED medical record.  Per report, patient was reported to be unresponsive for about 20minutes at the nursing care facility [facility]). Pt has expressive aphasia from recent stroke. BSE requested.    Assessment / Plan / Recommendation Clinical Impression  Clinical swallow evaluation completed at bedside. RN  stated that Pt "barely passed" the Yale swallow screen, so BSE requested. Pt was seen by SLP at [facility] in September for acute stroke with resulting expressive aphasia, BSE reg/thin. Note that Pt expressed feelings of hopelessness at that time. Oral motor examination is WNL with the exception of edentulous status (Pt's dentures are missing per Pt). Pt consumed ice chips, thin water via tsp/cup/straw, puree, and mechanical soft textures. Pt without overt signs or symptoms of aspiration and no reports of globus. Oral intake was limited despite SLP encouragement. Pt with mild oral phase with impaired mastication (edentulous) and prolonged oral transit. Pt benefited from liquid wash to facilitate oral clearance. Recommend D3/mech soft and thin liquids with standard aspiration and reflux precautions (Pt needs encouragement to sit upright), ok for po medications whole with water or in puree. SLP will follow x1 for diet tolerance as needed. Above to RN, Larene [place].   SLP Visit Diagnosis: Dysphagia, oral phase (R13.11)    Aspiration Risk  Mild aspiration risk    Diet Recommendation Dysphagia 3 (Mech soft);Thin liquid   Liquid Administration via: Cup;Straw Medication Administration: Whole meds with liquid Supervision: Patient able to self feed;Intermittent supervision to cue for compensatory strategies Postural Changes: Seated upright at 90 degrees;Remain upright for at least 30 minutes after po intake    Other  Recommendations Oral Care Recommendations: Oral care BID;Staff/trained caregiver to provide oral care Other Recommendations: Clarify dietary restrictions   Follow up Recommendations Skilled Nursing facility (Pt admitted from [facility])  Frequency and Duration min 2x/week  1 week       Prognosis Prognosis for Safe Diet Advancement: Good      Swallow Study   General Date of Onset: 10/10/20 HPI: Angelica Bell is a 86 y.o. female with medical history significant for remote PE;  chronic pain; recent stroke (08/2020 and seen at [facility]) and hypertension who presents to the emergency department due to loss of consciousness.  Patient was unable to provide history possibly due to an underlying dementia, history was obtained from ED physician and ED medical record.  Per report, patient was reported to be unresponsive for about 86minutes at the nursing care facility [facility]). Pt has expressive aphasia from recent stroke. BSE requested.  Type of Study: Bedside Swallow Evaluation Previous Swallow Assessment: BSE 09/01/20 at [facility] reg/thin Diet Prior to this Study: NPO Temperature Spikes Noted: No Respiratory Status: Room air History of Recent Intubation: No Behavior/Cognition: Alert;Cooperative;Pleasant mood Oral Cavity Assessment: Within Functional Limits Oral Care Completed by SLP: Recent completion by staff Oral Cavity - Dentition: Edentulous (Pt reportedly has dentures that are misplaced) Vision: Functional for self-feeding Self-Feeding Abilities: Able to feed self;Needs set up Patient Positioning: Upright in bed Baseline Vocal Quality: Normal;Low vocal intensity Volitional Cough: Cognitively unable to elicit Volitional Swallow: Able to elicit    Oral/Motor/Sensory Function Overall Oral Motor/Sensory Function: Within functional limits   Ice Chips Ice chips: Within functional limits Presentation: Spoon   Thin Liquid Thin Liquid: Within functional limits Presentation: Cup;Self Fed;Straw;Spoon    Nectar Thick Nectar Thick Liquid: Not tested   Honey Thick Honey Thick Liquid: Not tested   Puree Puree: Within functional limits Presentation: Spoon   Solid     Solid: Impaired Presentation: Spoon Oral Phase Impairments: Impaired mastication Oral Phase Functional Implications: Prolonged oral transit      Angelica Bell 10/12/2020,9:45 AM

## 2020-10-12 NOTE — Progress Notes (Signed)
Assumed care of pt. From CN. Condition stable. No changes to initial am assessment at this  Time. Cont with plan of care

## 2020-10-12 NOTE — TOC Progression Note (Signed)
Transition of Care [facility]) - Progression Note    Patient Details  Name: Harlene Petralia MRN: [id number] Date of Birth: Aug 16, 1932  Transition of Care [facility]) CM/SW Contact  Shade Flood, LCSW Phone Number: 10/12/2020, 10:57 AM  Clinical Narrative:     TOC following. Pt status discussed with MD in Progression this AM. MD anticipating likely dc back to [facility]. Updated Melissa at [facility]. Updated family.   Will follow.  Expected Discharge Plan: Long Term Nursing Home Barriers to Discharge: Continued Medical Work up  Expected Discharge Plan and Services Expected Discharge Plan: [place] In-house Referral: Clinical Social Work     Living arrangements for the past 2 months: [place]                                       Social Determinants of Health (SDOH) Interventions    Readmission Risk Interventions Readmission Risk Prevention Plan 05/15/2020  Transportation Screening Complete  PCP or Specialist Appt within 3-5 Days Complete  HRI or [place] Complete  Social Work Consult for [place] Planning/Counseling Complete  Palliative Care Screening Complete  Medication Review Press photographer) Complete  Some recent data might be hidden

## 2020-10-12 NOTE — Evaluation (Signed)
Occupational Therapy Evaluation Patient Details Name: Angelica Bell MRN: [id number] DOB: Apr 04, 1932 Today's Date: 10/12/2020    History of Present Illness Angelica Bell is a 86 y.o. female with medical history significant for remote PE; chronic pain; recent stroke and hypertension who presents to the emergency department due to loss of consciousness.  Patient was unable to provide history possibly due to an underlying dementia, history was obtained from ED physician and ED medical record.  Per report, patient was reported to be unresponsive for about 28minutes at the nursing care facility, baseline mental status unknown at this time.   Clinical Impression   Pt awake and alert this am, requesting assistance with toileting. Pt unable to come to EOB for attempting BSC transfer, therefore bedpan utilized. Pt able to roll with mod assist and cuing for hand placement, total assist for peri-care. Pt with RUE weakness from recent CVA, unable to use RUE functionally during session. Pt is assisted with ADLs by LTC staff, total assist with transfers. Pt is at baseline with ADL completion, no further OT services required at this time. Recommend return to LTC at discharge.     Follow Up Recommendations  SNF    Equipment Recommendations  None recommended by OT       Precautions / Restrictions Precautions Precautions: Fall Restrictions Weight Bearing Restrictions: No      Mobility Bed Mobility Overal bed mobility: Needs Assistance Bed Mobility: Rolling Rolling: Mod assist         General bed mobility comments: cuing for hand placement  Transfers                 General transfer comment: not completed        ADL either performed or assessed with clinical judgement   ADL Overall ADL's : Needs assistance/impaired     Grooming: Wash/dry hands;Wash/dry face;Set up;Bed level Grooming Details (indicate cue type and reason): pt using LUE for grooming with set-up                  Toilet Transfer: Maximal assistance Toilet Transfer Details (indicate cue type and reason): pt using bedpan for toileting, min/mod assist for rolling Toileting- Clothing Manipulation and Hygiene: Total assistance;Bed level Toileting - Clothing Manipulation Details (indicate cue type and reason): total assist for peri-care       General ADL Comments: Pt requiring max to total assistance for ADL completion, frequent reorientation provided     Vision Baseline Vision/History: No visual deficits Patient Visual Report: No change from baseline Vision Assessment?: No apparent visual deficits            Pertinent Vitals/Pain Pain Assessment: Faces Faces Pain Scale: Hurts a little bit Pain Location: abdomen Pain Descriptors / Indicators: Grimacing Pain Intervention(s): Limited activity within patient's tolerance;Monitored during session;Repositioned     Hand Dominance Right   Extremity/Trunk Assessment Upper Extremity Assessment Upper Extremity Assessment: RUE deficits/detail RUE Deficits / Details: RUE strength 2/5, decreased grip strength and coordination RUE Sensation: WNL RUE Coordination: decreased fine motor;decreased gross motor   Lower Extremity Assessment Lower Extremity Assessment: Defer to PT evaluation   Cervical / Trunk Assessment Cervical / Trunk Assessment: Kyphotic   Communication Communication Communication: No difficulties   Cognition Arousal/Alertness: Awake/alert Behavior During Therapy: Anxious Overall Cognitive Status: Within Functional Limits for tasks assessed  Home Living Family/patient expects to be discharged to:: Skilled nursing facility                                        Prior Functioning/Environment Level of Independence: Needs assistance  Gait / Transfers Assistance Needed: non-ambulator, assisted for transfers ADL's / Homemaking Assistance  Needed: assisted by SNF staff for all ADLs            OT Problem List: Decreased strength;Decreased activity tolerance;Impaired balance (sitting and/or standing);Decreased coordination;Decreased cognition;Decreased safety awareness;Decreased knowledge of use of DME or AE;Impaired UE functional use      OT Treatment/Interventions:      OT Goals(Current goals can be found in the care plan section) Acute Rehab OT Goals Patient Stated Goal: return to SNF  OT Frequency:                AM-PAC OT "6 Clicks" Daily Activity     Outcome Measure Help from another person eating meals?: A Little Help from another person taking care of personal grooming?: A Lot Help from another person toileting, which includes using toliet, bedpan, or urinal?: Total Help from another person bathing (including washing, rinsing, drying)?: Total Help from another person to put on and taking off regular upper body clothing?: A Lot Help from another person to put on and taking off regular lower body clothing?: Total 6 Click Score: 10   End of Session Nurse Communication: Other (comment) (pt cleaned up, complaining of mild abdominal pain)  Activity Tolerance: Patient tolerated treatment well Patient left: in bed;with call bell/phone within reach;with bed alarm set  OT Visit Diagnosis: Muscle weakness (generalized) (M62.81);Hemiplegia and hemiparesis Hemiplegia - Right/Left: Right Hemiplegia - dominant/non-dominant: Dominant Hemiplegia - caused by: Cerebral infarction                Time: 3612-2449 OT Time Calculation (min): 33 min Charges:  OT General Charges $OT Visit: 1 Visit OT Evaluation $OT Eval Low Complexity: 1 Low   Guadelupe Sabin, OTR/L  [phone] 10/12/2020, 8:29 AM

## 2020-10-12 NOTE — Plan of Care (Signed)
  Problem: Health Behavior/Discharge Planning: Goal: Ability to manage health-related needs will improve Outcome: Progressing   Problem: Clinical Measurements: Goal: Cardiovascular complication will be avoided Outcome: Progressing   Problem: Activity: Goal: Risk for activity intolerance will decrease Outcome: Progressing   

## 2020-10-12 NOTE — Care Management Obs Status (Signed)
[place] NOTIFICATION   Patient Details  Name: Angelica Bell MRN: [id number] Date of Birth: 03-Nov-1932   Medicare Observation Status Notification Given:  Yes    Tommy Medal 10/12/2020, 12:27 PM

## 2020-10-12 NOTE — Plan of Care (Signed)
  Problem: SLP Dysphagia Goals Goal: Patient will utilize recommended strategies Description: Patient will utilize recommended strategies during swallow to increase swallowing safety with Flowsheets (Taken 10/12/2020 0954) Patient will utilize recommended strategies during swallow to increase swallowing safety with: modified independence  Thank you,  Genene Churn, [place]

## 2020-10-13 DIAGNOSIS — N390 Urinary tract infection, site not specified: Secondary | ICD-10-CM | POA: Diagnosis not present

## 2020-10-13 DIAGNOSIS — E782 Mixed hyperlipidemia: Secondary | ICD-10-CM | POA: Diagnosis not present

## 2020-10-13 DIAGNOSIS — R627 Adult failure to thrive: Secondary | ICD-10-CM | POA: Diagnosis not present

## 2020-10-13 DIAGNOSIS — I10 Essential (primary) hypertension: Secondary | ICD-10-CM | POA: Diagnosis not present

## 2020-10-13 LAB — URINE CULTURE: Culture: 60000 — AB

## 2020-10-13 LAB — GLUCOSE, CAPILLARY
Glucose-Capillary: 128 mg/dL — ABNORMAL HIGH (ref 70–99)
Glucose-Capillary: 117 mg/dL — ABNORMAL HIGH (ref 70–99)

## 2020-10-13 MED ORDER — MIRTAZAPINE 7.5 MG PO TABS: 7.5000 mg | ORAL_TABLET | Freq: Every day | ORAL | Status: AC

## 2020-10-13 MED ORDER — NITROFURANTOIN MONOHYD MACRO 100 MG PO CAPS: 100.0000 mg | ORAL_CAPSULE | Freq: Two times a day (BID) | ORAL | Status: AC

## 2020-10-13 NOTE — TOC Transition Note (Signed)
Transition of Care [facility]) - CM/SW Discharge Note   Patient Details  Name: Angelica Bell MRN: [id number] Date of Birth: 1932/09/14  Transition of Care [facility]) CM/SW Contact:  Shade Flood, LCSW Phone Number: 10/13/2020, 12:20 PM   Clinical Narrative:     TOC following. Pt stable for dc today per MD. Plan remains for return to [facility]. DC clinical will be sent electronically once available. RN to call report. Will arrange EMS once dc orders entered.  Family aware. There are no other TOC needs for dc.  Final next level of care: Long Term Nursing Home Barriers to Discharge: Barriers Resolved   Patient Goals and CMS Choice        Discharge Placement                       Discharge Plan and Services In-house Referral: Clinical Social Work                                   Social Determinants of Health (SDOH) Interventions     Readmission Risk Interventions Readmission Risk Prevention Plan 05/15/2020  Transportation Screening Complete  PCP or Specialist Appt within 3-5 Days Complete  HRI or [place] Complete  Social Work Consult for [place] Planning/Counseling Complete  Palliative Care Screening Complete  Medication Review Press photographer) Complete  Some recent data might be hidden

## 2020-10-13 NOTE — Discharge Summary (Signed)
Physician Discharge Summary  Angelica Bell IWP:[id number] DOB: 1932-10-19 DOA: 10/10/2020  PCP: Heywood Bene, PA-C  Admit date: 10/10/2020 Discharge date: 10/13/2020  Time spent: 35 minutes  Recommendations for Outpatient Follow-up:  1. Repeat basic metabolic panel to follow electrolytes renal function 2. Rehabilitation/conditioning.  Skilled nursing facility protocol 3. Continue assisting patient with adequate nutrition/hydration. 4. Reassess blood pressure and adjust antihypertensive regimen as required.   Discharge Diagnoses:  Active Problems:   Hyperlipidemia   Essential hypertension   UTI (urinary tract infection)   Acute metabolic encephalopathy   Acute CVA (cerebrovascular accident) ([place])   Hyperglycemia   Failure to thrive in adult   AMS (altered mental status)   Discharge Condition: Stable and improved.  Discharge back to skilled nursing facility to further assist with care and rehabilitation/conditioning.  Outpatient follow-up with PCP in 10 days after discharge from nursing home.  CODE STATUS: Full code  Diet recommendation: Heart healthy diet (dysphagia 3 with thin liquids).  Filed Weights   10/10/20 2041 10/12/20 0349  Weight: 48 kg 44 kg    History of present illness:  As per H&P written by Dr. Josephine Cables on 10/11/2020 [id number] Angelica Crane Smithis a 86 y.o.femalewith medical history significant forremote PE; chronic pain;recent strokeand hypertension who presents to the emergency department due to loss of consciousness. Patient was unable to provide history possibly due to an underlying dementia, history was obtained from ED physician and ED medical record. Per report, patient was reported to be unresponsive for about 70minutes at the nursing care facility, baseline mental status unknown at this time. She was recently admitted and discharged from 8/31-9/7 due toacute ischemic stroke involving the left frontotemporal insular and occipital  temporal lobes.  ED Course: In the emergency department, temperature was 97.49F, and vital signs were within normal range. Work-up in the ED showed normal CBC and hyperglycemia. Urinalysis was positive for small hemoglobin, large leukocytes and WBC > 50.Respiratory panel for influenza A &B as well as SARS coronavirus 2 was negative. CT head without contrast showed possible acute on chronic left frontoparietal infarctionwithno intracranial hemorrhage. Patient was treated with IV ceftriaxone due to presumed UTI, IV hydration of 500 mL was given. Teleneurology was consulted with recommendation for MRI and EEG in the morning.  Hospital Course:  1-acute/subacute CVA affecting left temporal region -Recent work-up for the same at [facility]:; Repeat images suggesting extension of previous infarcted area -No hemorrhagic changes. -Continue aspirin and Plavix for secondary prevention -Will also continue statins -Dysphagia 3 diet with thin liquids as per speech therapy recommendations -PT recommending rehabilitation and conditioning and a skilled nursing facility -No further episodes of unresponsiveness, lightheadedness, or new focal deficits. -During hospitalization patient declined the use of VTE prophylaxis. -EEG without epileptic wave abnormalities.  2-Hyperlipidemia -Continue statins -Heart healthy diet (dysphagia 3 with thin liquids recommended).  3-Essential hypertension -Allowed permissive hypertension during hospitalization -Resume home antihypertensive regimen.  4-UTI (urinary tract infection): Urine culture demonstrating staph areas and Enterococcus faecalis microorganism -Based on sensitivity will treat with Macrobid twice a day for 5 days -Patient without dysuria, fever or elevated WBCs at time of discharge -Advised to maintain adequate hydration.  5-Acute metabolic encephalopathy -In the setting of subacute CVA and UTI -Current mentation is essentially back to baseline  according to grandson -Continue to provide constant reorientation and maintain adequate nutrition/hydration. -Continue secondary prevention for stroke and antibiotic therapy for UTI.  6-Failure to thrive in adult severe protein calorie malnutrition -Patient reports decreased appetite. -Continue multivitamins on daily basis  along with feeding supplements -Will add low-dose Remeron to assist with appetite.  7-dementia/depression -continue aricept and Wellbutrin  -no SI or halluicinations   Procedures:  See below for x-ray reports  Consultations:  Neurology service  Discharge Exam: Vitals:   10/12/20 2121 10/13/20 1350  BP: (!) 145/75 (!) 164/91  Pulse: 79 89  Resp: 18 18  Temp: 98.1 F (36.7 C) 98.2 F (36.8 C)  SpO2: 98% 97%    General: Afebrile, no chest pain, no nausea, no vomiting.  Feeling weak and deconditioned.  No new focal neurologic deficits appreciated.  Still having right-sided weakness. Cardiovascular: S1 and S2, no rubs, no gallops, no JVD on exam. Respiratory: Good air movement bilaterally; no requiring oxygen supplementation. Abdomen: Soft, nontender, nondistended, positive bowel sounds Extremities: No cyanosis or clubbing.  Discharge Instructions   Discharge Instructions    Diet - low sodium heart healthy   Complete by: As directed    Discharge instructions   Complete by: As directed    Maintain adequate hydration Take medications as prescribed Outpatient follow-up with neurologist as previously scheduled Heart healthy diet (dysphagia 3 with thin liquids). Physical therapy and rehabilitation as per the skilled nursing facility protocol.   Increase activity slowly   Complete by: As directed      Allergies as of 10/13/2020      Reactions   Hctz [hydrochlorothiazide]    Significant and rapid hyponatremia (TIH)   Amoxicillin-pot Clavulanate Hives, Diarrhea   Has patient had a PCN reaction causing immediate rash, facial/tongue/throat  swelling, SOB or lightheadedness with hypotension: yes Has patient had a PCN reaction causing severe rash involving mucus membranes or skin necrosis: yes Has patient had a PCN reaction that required hospitalization : unknown Has patient had a PCN reaction occurring within the last 10 years: unknown If all of the above answers are "NO", then may proceed with Cephalosporin use.   Lisinopril-hydrochlorothiazide Other (See Comments)   Tired- no energy to do anything..   Sulfa Antibiotics Hives, Nausea And Vomiting   Sulfonamide Derivatives Hives      Medication List    STOP taking these medications   escitalopram 20 MG tablet Commonly known as: LEXAPRO   gabapentin 100 MG capsule Commonly known as: NEURONTIN   oxyCODONE 5 MG immediate release tablet Commonly known as: Oxy IR/ROXICODONE     TAKE these medications   acetaminophen 500 MG tablet Commonly known as: TYLENOL Take 500 mg by mouth every 6 (six) hours as needed for moderate pain.   amLODipine 2.5 MG tablet Commonly known as: NORVASC Take 1 tablet (2.5 mg total) by mouth daily.   aspirin 81 MG EC tablet Take 1 tablet (81 mg total) by mouth daily.   atorvastatin 40 MG tablet Commonly known as: LIPITOR Take 1 tablet (40 mg total) by mouth daily.   buPROPion 75 MG tablet Commonly known as: WELLBUTRIN Take 75 mg by mouth daily.   clopidogrel 75 MG tablet Commonly known as: PLAVIX Take 1 tablet (75 mg total) by mouth daily. Stop after 25 days   donepezil 5 MG tablet Commonly known as: ARICEPT Take 5 mg by mouth daily.   mirtazapine 7.5 MG tablet Commonly known as: REMERON Take 1 tablet (7.5 mg total) by mouth at bedtime.   multivitamin with minerals Tabs tablet Take 1 tablet by mouth daily.   nitrofurantoin (macrocrystal-monohydrate) 100 MG capsule Commonly known as: Macrobid Take 1 capsule (100 mg total) by mouth 2 (two) times daily for 5 days.   Resource  2.0 Liqd Take 60 mLs by mouth in the morning, at  noon, and at bedtime.   Vitamin D3 25 MCG tablet Commonly known as: Vitamin D Take 1 tablet (1,000 Units total) by mouth daily.      Allergies  Allergen Reactions  . Hctz [Hydrochlorothiazide]     Significant and rapid hyponatremia (TIH)  . Amoxicillin-Pot Clavulanate Hives and Diarrhea    Has patient had a PCN reaction causing immediate rash, facial/tongue/throat swelling, SOB or lightheadedness with hypotension: yes Has patient had a PCN reaction causing severe rash involving mucus membranes or skin necrosis: yes Has patient had a PCN reaction that required hospitalization : unknown Has patient had a PCN reaction occurring within the last 10 years: unknown If all of the above answers are "NO", then may proceed with Cephalosporin use.   Marland Kitchen Lisinopril-Hydrochlorothiazide Other (See Comments)    Tired- no energy to do anything..  . Sulfa Antibiotics Hives and Nausea And Vomiting  . Sulfonamide Derivatives Hives    Follow-up Information    Heywood Bene, PA-C. Schedule an appointment as soon as possible for a visit in 10 day(s).   Specialty: Physician Assistant Why: After discharge from the skilled nursing facility Contact information: [address] [place] [address] [phone]               The results of significant diagnostics from this hospitalization (including imaging, microbiology, ancillary and laboratory) are listed below for reference.    Significant Diagnostic Studies: CT Head Wo Contrast  Result Date: 10/11/2020 CLINICAL DATA:  Neuro deficit, acute, stroke suspected. Less responsive. EXAM: CT HEAD WITHOUT CONTRAST TECHNIQUE: Contiguous axial images were obtained from the base of the skull through the vertex without intravenous contrast. COMPARISON:  MR head 08/31/2020, CT head 08/29/2020, CT head 05/12/2020. FINDINGS: Brain: Chronic right cerebellar infarction. Left frontoparietal white matter hypodensity consistent with interval evolution  of recent subacute/chronic infarction seen on MR head 08/31/2020. Interval development of gray-white matter differentiation within the left frontoparietal lobe may represent a superimposed acute component on a subacute/chronic infarction (2:18, 4:37). Patchy and confluent areas of decreased attenuation are noted throughout the deep and periventricular white matter of the cerebral hemispheres bilaterally, compatible with chronic microvascular ischemic disease. No parenchymal hemorrhage. No mass lesion. No extra-axial collection. No mass effect or midline shift. No hydrocephalus. Basilar cisterns are patent. Vascular: No hyperdense vessel. Skull: No acute fracture or focal lesion. Sinuses/Orbits: Paranasal sinuses and mastoid air cells are clear. The orbits are unremarkable. Other: None. IMPRESSION: 1. Possible acute on chronic left frontoparietal infarction. Consider MRI noncontrast for further evaluation. 2. No intracranial hemorrhage. These results were called by telephone at the time of interpretation on 10/11/2020 at 12:04 am to provider [facility] , who verbally acknowledged these results. Electronically Signed   By: Iven Finn M.D.   On: 10/11/2020 00:07   MR ANGIO HEAD WO CONTRAST  Result Date: 10/11/2020 CLINICAL DATA:  Neuro deficit, acute, stroke suspected. Altered mental status, possible stroke, history of stroke in September. EXAM: MRI HEAD WITHOUT CONTRAST MRA HEAD WITHOUT CONTRAST TECHNIQUE: Multiplanar, multiecho pulse sequences of the brain and surrounding structures were obtained without intravenous contrast. Angiographic images of the head were obtained using MRA technique without contrast. COMPARISON:  Head CT 12/10/2020, head CT 10/10/2020, MRA head 08/31/2020, MRI head 08/31/2020. FINDINGS: MRI HEAD FINDINGS Brain: The examination is intermittently motion degraded, limiting evaluation. Most notably, there is moderate motion degradation of the axial diffusion-weighted sequence.  Stable, mild generalized  cerebral atrophy. There is diffusion weighted and T2/FLAIR hyperintensity within the left frontoparietal white matter which has increased in extent since the prior examination of 08/31/2020, now measuring 3.4 x 3.4 cm in transaxial dimensions. There is predominantly corresponding intermediate signal or T2 shine through on the [facility] map and this predominantly reflects late subacute to chronic infarction. However, there is a small region of true strict diffusion anteriorly which may reflect acute/early subacute infarction (series 6, image 32). There are adjacent late subacute to chronic cortical infarction changes within the left frontoparietal lobe, posterior left insula and left frontal operculum which have not significantly changed in extent since the prior MRI of 08/31/2020 (these infarcts were acute at that time). There is gyriform T1 hyperintensity at this site consistent with cortical laminar necrosis. Minimal chronic blood products are also present at this site. Redemonstrated chronic lacunar infarcts within the bilateral cerebral white matter and right basal ganglia. Redemonstrated chronic infarcts within the right cerebellum. Stable background moderate multifocal T2/FLAIR hyperintensity within the cerebral white matter which is nonspecific, but consistent with chronic small vessel ischemic disease. To a lesser degree, chronic small vessel ischemic changes are also present within the pons. There are a few scattered supratentorial and infratentorial chronic microhemorrhages, nonspecific. Incidentally noted, there is a 5 mm extra-axial dural-based mass overlying the anterior right frontal lobe which likely reflects a small meningioma. No extra-axial fluid collection. No midline shift. Vascular: Intracranial arterial circulation reported below. Unchanged from the prior MRI of 08/31/2020, there is T2/FLAIR hyperintensity within the left transverse and sigmoid dural venous sinuses. Skull and  upper cervical spine: No focal marrow lesion. Sinuses/Orbits: Visualized orbits show no acute finding. Minimal ethmoid sinus mucosal thickening. No significant mastoid effusion. MRA HEAD FINDINGS The examination is motion degraded at the level of the skull base. The intracranial internal carotid arteries are patent. The M1 middle cerebral arteries are patent without significant stenosis. No M2 proximal branch occlusion is identified. Unchanged, there is a moderate/severe focal stenosis within a distal inferior division M2 left MCA branch vessel (series 104, image 5). Also unchanged, there are multiple moderate/severe stenoses within a small proximal to mid superior division left M2 MCA branch vessel (series 104, image 8). The anterior cerebral arteries are patent. The right A1 segment is hypoplastic or absent. The intracranial vertebral arteries are patent. The basilar artery is patent. The posterior cerebral arteries are patent. Posterior communicating arteries are hypoplastic or absent bilaterally. No intracranial aneurysm is identified. These results will be called to the ordering clinician or representative by the Radiologist Assistant, and communication documented in the PACS or [employer]. IMPRESSION: MRI brain: 1. Motion degraded examination. 2. 3.4 x 3.4 cm infarction within the left frontoparietal white matter which has increased in extent since the prior MRI of 08/31/2020, but appears predominantly late subacute to chronic. There is a small region at the anterior aspect of this infarct which demonstrates true restricted diffusion and may reflect an acute/early subacute component. 3. Adjacent late subacute to chronic cortically based infarction changes within the left frontoparietal lobes, posterior left insula and left frontal operculum which have not significantly changed in extent since the prior MRI. 4. Stable background mild generalized parenchymal atrophy and moderate chronic small vessel  ischemic disease. Redemonstrated chronic infarcts within the bilateral cerebral white matter, right basal ganglia and right cerebellum. 5. Unchanged from the prior MRI, there is T2/FLAIR hyperintense signal abnormality within the left transverse and sigmoid dural venous sinuses. This may be due to slow flow. However, a  dural venous sinus thrombosis cannot be excluded. Consider CT or MR venography for further evaluation. 6. Incidentally noted 5 mm meningioma overlying the right frontal convexity. MRA head: 1. Motion degraded examination. 2. No intracranial large vessel occlusion. 3. Unchanged moderate/severe focal stenosis within a distal inferior division M2 left MCA branch vessel. 4. Unchanged sites of moderate/severe stenosis within a proximal to mid small superior division M2 left MCA branch vessel. Electronically Signed   By: Kellie Simmering DO   On: 10/11/2020 08:09   MR BRAIN WO CONTRAST  Result Date: 10/11/2020 CLINICAL DATA:  Neuro deficit, acute, stroke suspected. Altered mental status, possible stroke, history of stroke in September. EXAM: MRI HEAD WITHOUT CONTRAST MRA HEAD WITHOUT CONTRAST TECHNIQUE: Multiplanar, multiecho pulse sequences of the brain and surrounding structures were obtained without intravenous contrast. Angiographic images of the head were obtained using MRA technique without contrast. COMPARISON:  Head CT 12/10/2020, head CT 10/10/2020, MRA head 08/31/2020, MRI head 08/31/2020. FINDINGS: MRI HEAD FINDINGS Brain: The examination is intermittently motion degraded, limiting evaluation. Most notably, there is moderate motion degradation of the axial diffusion-weighted sequence. Stable, mild generalized cerebral atrophy. There is diffusion weighted and T2/FLAIR hyperintensity within the left frontoparietal white matter which has increased in extent since the prior examination of 08/31/2020, now measuring 3.4 x 3.4 cm in transaxial dimensions. There is predominantly corresponding  intermediate signal or T2 shine through on the [facility] map and this predominantly reflects late subacute to chronic infarction. However, there is a small region of true strict diffusion anteriorly which may reflect acute/early subacute infarction (series 6, image 32). There are adjacent late subacute to chronic cortical infarction changes within the left frontoparietal lobe, posterior left insula and left frontal operculum which have not significantly changed in extent since the prior MRI of 08/31/2020 (these infarcts were acute at that time). There is gyriform T1 hyperintensity at this site consistent with cortical laminar necrosis. Minimal chronic blood products are also present at this site. Redemonstrated chronic lacunar infarcts within the bilateral cerebral white matter and right basal ganglia. Redemonstrated chronic infarcts within the right cerebellum. Stable background moderate multifocal T2/FLAIR hyperintensity within the cerebral white matter which is nonspecific, but consistent with chronic small vessel ischemic disease. To a lesser degree, chronic small vessel ischemic changes are also present within the pons. There are a few scattered supratentorial and infratentorial chronic microhemorrhages, nonspecific. Incidentally noted, there is a 5 mm extra-axial dural-based mass overlying the anterior right frontal lobe which likely reflects a small meningioma. No extra-axial fluid collection. No midline shift. Vascular: Intracranial arterial circulation reported below. Unchanged from the prior MRI of 08/31/2020, there is T2/FLAIR hyperintensity within the left transverse and sigmoid dural venous sinuses. Skull and upper cervical spine: No focal marrow lesion. Sinuses/Orbits: Visualized orbits show no acute finding. Minimal ethmoid sinus mucosal thickening. No significant mastoid effusion. MRA HEAD FINDINGS The examination is motion degraded at the level of the skull base. The intracranial internal carotid arteries  are patent. The M1 middle cerebral arteries are patent without significant stenosis. No M2 proximal branch occlusion is identified. Unchanged, there is a moderate/severe focal stenosis within a distal inferior division M2 left MCA branch vessel (series 104, image 5). Also unchanged, there are multiple moderate/severe stenoses within a small proximal to mid superior division left M2 MCA branch vessel (series 104, image 8). The anterior cerebral arteries are patent. The right A1 segment is hypoplastic or absent. The intracranial vertebral arteries are patent. The basilar artery is patent. The posterior cerebral  arteries are patent. Posterior communicating arteries are hypoplastic or absent bilaterally. No intracranial aneurysm is identified. These results will be called to the ordering clinician or representative by the Radiologist Assistant, and communication documented in the PACS or [employer]. IMPRESSION: MRI brain: 1. Motion degraded examination. 2. 3.4 x 3.4 cm infarction within the left frontoparietal white matter which has increased in extent since the prior MRI of 08/31/2020, but appears predominantly late subacute to chronic. There is a small region at the anterior aspect of this infarct which demonstrates true restricted diffusion and may reflect an acute/early subacute component. 3. Adjacent late subacute to chronic cortically based infarction changes within the left frontoparietal lobes, posterior left insula and left frontal operculum which have not significantly changed in extent since the prior MRI. 4. Stable background mild generalized parenchymal atrophy and moderate chronic small vessel ischemic disease. Redemonstrated chronic infarcts within the bilateral cerebral white matter, right basal ganglia and right cerebellum. 5. Unchanged from the prior MRI, there is T2/FLAIR hyperintense signal abnormality within the left transverse and sigmoid dural venous sinuses. This may be due to slow flow.  However, a dural venous sinus thrombosis cannot be excluded. Consider CT or MR venography for further evaluation. 6. Incidentally noted 5 mm meningioma overlying the right frontal convexity. MRA head: 1. Motion degraded examination. 2. No intracranial large vessel occlusion. 3. Unchanged moderate/severe focal stenosis within a distal inferior division M2 left MCA branch vessel. 4. Unchanged sites of moderate/severe stenosis within a proximal to mid small superior division M2 left MCA branch vessel. Electronically Signed   By: Kellie Simmering DO   On: 10/11/2020 08:09   US Carotid Bilateral  Result Date: 10/12/2020 CLINICAL DATA:  Syncope and recent stroke. EXAM: BILATERAL CAROTID DUPLEX ULTRASOUND TECHNIQUE: Pearline Cables scale imaging, color Doppler and duplex ultrasound were performed of bilateral carotid and vertebral arteries in the neck. COMPARISON:  Report from a prior study performed at [facility] on 09/01/2020 FINDINGS: Criteria: Quantification of carotid stenosis is based on velocity parameters that correlate the residual internal carotid diameter with NASCET-based stenosis levels, using the diameter of the distal internal carotid lumen as the denominator for stenosis measurement. The following velocity measurements were obtained: RIGHT ICA:  69/17 cm/sec CCA:  49/44 cm/sec SYSTOLIC ICA/CCA RATIO:  1.0 ECA:  94 cm/sec LEFT ICA:  91/24 cm/sec CCA:  96/75 cm/sec SYSTOLIC ICA/CCA RATIO:  1.4 ECA:  78 cm/sec RIGHT CAROTID ARTERY: Mild amount of partially calcified plaque is present at the level of the carotid bulb and extending into the proximal right ICA. Estimated right ICA stenosis is less than 50%. RIGHT VERTEBRAL ARTERY: Antegrade flow with normal waveform and velocity. LEFT CAROTID ARTERY: There is a mild amount of partially calcified plaque at the level of the left carotid bulb and proximal left ICA. Estimated left ICA stenosis is less than 50%. LEFT VERTEBRAL ARTERY: Antegrade flow with normal waveform and  velocity. IMPRESSION: Mild amount of plaque at the level of bilateral carotid bulbs and proximal internal carotid arteries. No significant carotid stenosis identified with estimated bilateral ICA stenoses of less than 50%. Electronically Signed   By: Aletta Edouard M.D.   On: 10/12/2020 08:36   EEG adult  Result Date: 10/11/2020 Lora Havens, MD     10/11/2020  2:11 PM Patient Name: Angelica Bell MRN: [id number] Epilepsy Attending: Lora Havens Referring Physician/Provider: Dr Nicanor Bake Date: 10/11/2020 Duration: 27.47 mins Patient history: 86 year old female with altered mental status.  EEG to evaluate for seizures.  Level of alertness: Awake AEDs during EEG study: None Technical aspects: This EEG study was done with scalp electrodes positioned according to the 10-20 International system of electrode placement. Electrical activity was acquired at a sampling rate of 500Hz  and reviewed with a high frequency filter of 70Hz  and a low frequency filter of 1Hz . EEG data were recorded continuously and digitally stored. Description: The posterior dominant rhythm consists of 8 Hz activity of moderate voltage (25-35 uV) seen predominantly in posterior head regions, symmetric and reactive to eye opening and eye closing.  EEG showed intermittent generalized and maximal left frontotemporal region 2 to 3 Hz delta slowing.  Hyperventilation and photic stimulation were not performed.   ABNORMALITY -Intermittent slow, generalized and maximal left frontotemporal region IMPRESSION: This study is suggestive of cortical dysfunction in left frontotemporal region consistent with underlying infarct as well mild diffuse encephalopathy, nonspecific etiology.  No seizures or epileptiform discharges were seen throughout the recording. Lora Havens   ECHOCARDIOGRAM LIMITED  Result Date: 10/11/2020    ECHOCARDIOGRAM LIMITED REPORT   Patient Name:   Angelica Bell [facility] Date of Exam: 10/11/2020 Medical Rec #:   [id number]          Height:       66.0 in Accession #:    [id number]         Weight:       105.8 lb Date of Birth:  08/30/1932          BSA:          1.526 m Patient Age:    86 years           BP:           109/67 mmHg Patient Gender: F                  HR:           68 bpm. Exam Location:  [facility] Procedure: Intracardiac Opacification Agent and Limited Echo Indications:    Stroke 434.91 / I163.9  History:        Patient has prior history of Echocardiogram examinations, most                 recent 09/03/2020. Stroke; Risk Factors:Dyslipidemia, Hypertension                 and Former Smoker. Opiod Overdose, Confusion.  Sonographer:    Leavy Cella RDCS (AE) Referring Phys: [id number] [facility] ADEFESO  Sonographer Comments: Image acquisition challenging due to breast implants and Image acquisition challenging due to uncooperative patient. IMPRESSIONS  1. Very limited images.  2. Left ventricular ejection fraction, by estimation, is 55 to 60%. The left ventricle has normal function. Left ventricular endocardial border not optimally defined to evaluate regional wall motion.  3. The mitral valve is abnormal, mildly thickened and calcified and with mild to moderate annular calcification. Trivial mitral valve regurgitation. FINDINGS  Left Ventricle: Left ventricular ejection fraction, by estimation, is 55 to 60%. The left ventricle has normal function. Left ventricular endocardial border not optimally defined to evaluate regional wall motion. Definity contrast agent was given IV to delineate the left ventricular endocardial borders. Mitral Valve: The mitral valve is abnormal. There is mild thickening of the mitral valve leaflet(s). There is mild calcification of the mitral valve leaflet(s). Mild to moderate mitral annular calcification. Trivial mitral valve regurgitation. Tricuspid Valve: The tricuspid valve is not well visualized. Tricuspid valve regurgitation is trivial. MITRAL VALVE MV Area (PHT): 2.90 cm  MV Decel Time:  262 msec MV E velocity: 52.30 cm/s MV A velocity: 94.30 cm/s MV E/A ratio:  0.55 Rozann Lesches MD Electronically signed by Rozann Lesches MD Signature Date/Time: 10/11/2020/1:22:38 PM    Final     Microbiology: Recent Results (from the past 240 hour(s))  Urine culture     Status: Abnormal   Collection Time: 10/10/20 10:59 PM   Specimen: Urine, Catheterized  Result Value Ref Range Status   Specimen Description   Final    URINE, CATHETERIZED Performed at [facility], [address] [place], [place] [address]    Special Requests   Final    NONE Performed at [facility], [address] [place],  [address]    Culture (A)  Final    60,000 COLONIES/mL METHICILLIN RESISTANT STAPHYLOCOCCUS AUREUS 30,000 COLONIES/mL ENTEROCOCCUS FAECALIS VANCOMYCIN RESISTANT ENTEROCOCCUS ISOLATED    Report Status 10/13/2020 FINAL  Final   Organism ID, Bacteria METHICILLIN RESISTANT STAPHYLOCOCCUS AUREUS (A)  Final   Organism ID, Bacteria ENTEROCOCCUS FAECALIS (A)  Final      Susceptibility   Enterococcus faecalis - MIC*    AMPICILLIN <=2 SENSITIVE Sensitive     NITROFURANTOIN <=16 SENSITIVE Sensitive     VANCOMYCIN >=32 RESISTANT Resistant     LINEZOLID 2 SENSITIVE Sensitive     * 30,000 COLONIES/mL ENTEROCOCCUS FAECALIS   Methicillin resistant staphylococcus aureus - MIC*    CIPROFLOXACIN >=8 RESISTANT Resistant     GENTAMICIN <=0.5 SENSITIVE Sensitive     NITROFURANTOIN <=16 SENSITIVE Sensitive     OXACILLIN >=4 RESISTANT Resistant     TETRACYCLINE <=1 SENSITIVE Sensitive     VANCOMYCIN 1 SENSITIVE Sensitive     TRIMETH/SULFA >=320 RESISTANT Resistant     CLINDAMYCIN <=0.25 SENSITIVE Sensitive     RIFAMPIN <=0.5 SENSITIVE Sensitive     Inducible Clindamycin NEGATIVE Sensitive     * 60,000 COLONIES/mL METHICILLIN RESISTANT STAPHYLOCOCCUS AUREUS  Respiratory Panel by RT PCR (Flu A&B, Covid) - Nasopharyngeal Swab     Status: None   Collection Time: 10/10/20 11:20 PM   Specimen:  Nasopharyngeal Swab  Result Value Ref Range Status   SARS Coronavirus 2 by RT PCR NEGATIVE NEGATIVE Final    Comment: (NOTE) SARS-CoV-2 target nucleic acids are NOT DETECTED.  The SARS-CoV-2 RNA is generally detectable in upper respiratoy specimens during the acute phase of infection. The lowest concentration of SARS-CoV-2 viral copies this assay can detect is 131 copies/mL. A negative result does not preclude SARS-Cov-2 infection and should not be used as the sole basis for treatment or other patient management decisions. A negative result may occur with  improper specimen collection/handling, submission of specimen other than nasopharyngeal swab, presence of viral mutation(s) within the areas targeted by this assay, and inadequate number of viral copies (<131 copies/mL). A negative result must be combined with clinical observations, patient history, and epidemiological information. The expected result is Negative.  Fact Sheet for Patients:  PinkCheek.be  Fact Sheet for Healthcare Providers:  GravelBags.it  This test is no t yet approved or cleared by the [place] FDA and  has been authorized for detection and/or diagnosis of SARS-CoV-2 by FDA under an Emergency Use Authorization (EUA). This EUA will remain  in effect (meaning this test can be used) for the duration of the COVID-19 declaration under Section 564(b)(1) of the Act, 21 U.S.C. section 360bbb-3(b)(1), unless the authorization is terminated or revoked sooner.     Influenza A by PCR NEGATIVE NEGATIVE Final   Influenza B  by PCR NEGATIVE NEGATIVE Final    Comment: (NOTE) The Xpert Xpress SARS-CoV-2/FLU/RSV assay is intended as an aid in  the diagnosis of influenza from Nasopharyngeal swab specimens and  should not be used as a sole basis for treatment. Nasal washings and  aspirates are unacceptable for Xpert Xpress SARS-CoV-2/FLU/RSV  testing.  Fact Sheet  for Patients: PinkCheek.be  Fact Sheet for Healthcare Providers: GravelBags.it  This test is not yet approved or cleared by the [place] FDA and  has been authorized for detection and/or diagnosis of SARS-CoV-2 by  FDA under an Emergency Use Authorization (EUA). This EUA will remain  in effect (meaning this test can be used) for the duration of the  Covid-19 declaration under Section 564(b)(1) of the Act, 21  U.S.C. section 360bbb-3(b)(1), unless the authorization is  terminated or revoked. Performed at [facility], [address], [place], [place] [address]      Labs: Basic Metabolic Panel: Recent Labs  Lab 10/10/20 2051 10/11/20 0450  NA 135 140  K 3.7 3.6  CL 98 103  CO2 30 29  GLUCOSE 153* 107*  BUN 23 20  CREATININE 0.83 0.58  CALCIUM 10.1 10.2  MG  --  1.8  PHOS  --  3.8   Liver Function Tests: Recent Labs  Lab 10/11/20 0450  AST 18  ALT 16  ALKPHOS 87  BILITOT 0.3  PROT 5.6*  ALBUMIN 2.9*   CBC: Recent Labs  Lab 10/10/20 2051 10/11/20 0450  WBC 9.4 10.1  HGB 13.1 12.1  HCT 40.8 38.8  MCV 90.1 89.0  PLT 365 343   CBG: Recent Labs  Lab 10/11/20 1749 10/12/20 1151 10/12/20 1839 10/12/20 2341 10/13/20 1118  GLUCAP 104* 97 188* 116* 128*    Signed:  Barton Dubois MD.  [facility] 10/13/2020, 3:44 PM

## 2020-10-13 NOTE — Progress Notes (Addendum)
Initial Nutrition Assessment  DOCUMENTATION CODES:   Severe malnutrition in context of chronic illness  INTERVENTION:   Ensure Enlive po BID, each supplement provides 350 kcal and 20 grams of protein   Encourage meal/supplement intake and provide tray set up  NUTRITION DIAGNOSIS:    Severe Malnutrition related to chronic illness, poor appetite (Failure to thrive Adult) as evidenced by energy intake < or equal to 75% for > or equal to 1 month, severe muscle depletion, percent weight loss.    GOAL:  Patient will meet greater than or equal to 90% of their needs (if feasible given her underlying dementia)   MONITOR:   PO intake, Weight trends, Labs, Skin, Supplement acceptance  REASON FOR ASSESSMENT:   Consult Assessment of nutrition requirement/status  ASSESSMENT: Patient is a 86 yo female with hx of HTN, Dementia, stroke in September. Presents from SNF after being found unresponsive.    Patient awake but unable to respond appropriately to questions regarding recent intake. Her lunch tray is still here and about 25% consumed. ST bedside eval completed. Able to feed herself per ST note. Remeron added for appetite.   Weight loss per chart review 4 kg (8.8 lb) 8% in 2 months which is significant.   Medications reviewed and include: MVI, vitamin D3, Aricept, Lipitor.  IV-NS@50  ml/hr  Labs: BMP Latest Ref Rng & Units 10/11/2020 10/10/2020 09/05/2020  Glucose 70 - 99 mg/dL 107(H) 153(H) 110(H)  BUN 8 - 23 mg/dL 20 23 21   Creatinine 0.44 - 1.00 mg/dL 0.58 0.83 0.75  Sodium 135 - 145 mmol/L 140 135 137  Potassium 3.5 - 5.1 mmol/L 3.6 3.7 4.1  Chloride 98 - 111 mmol/L 103 98 101  CO2 22 - 32 mmol/L 29 30 28   Calcium 8.9 - 10.3 mg/dL 10.2 10.1 9.6    NUTRITION - FOCUSED PHYSICAL EXAM:    Most Recent Value  Orbital Region Moderate depletion  Upper Arm Region Severe depletion  Thoracic and Lumbar Region Severe depletion  Buccal Region Moderate depletion  Temple Region Moderate  depletion  Clavicle Bone Region Severe depletion  Clavicle and Acromion Bone Region Severe depletion  Scapular Bone Region Unable to assess  Patellar Region Severe depletion  Anterior Thigh Region Severe depletion  Posterior Calf Region Moderate depletion  Hair Reviewed  Eyes Reviewed  Mouth Reviewed  Skin Reviewed  Nails Reviewed      Diet Order:   Diet Order            DIET DYS 3 Room service appropriate? Yes; Fluid consistency: Thin  Diet effective now                 EDUCATION NEEDS:  Not appropriate for education at this time    Skin:    Her right wrist/hand are swollen and tender to touch.   Last BM:  10/15  Height:   Ht Readings from Last 1 Encounters:  10/12/20 5\' 6"  (1.676 m)    Weight:   Wt Readings from Last 1 Encounters:  10/12/20 44 kg    Ideal Body Weight:   59 kg  BMI:  Body mass index is 15.66 kg/m.  Estimated Nutritional Needs:   Kcal:  1500-1700  Protein:  61-70 gr  Fluid:  >1200 ml daily   Colman Cater MS,RD,CSG,LDN Pager: Shea Evans

## 2020-10-13 NOTE — Care Management Important Message (Signed)
Important Message  Patient Details  Name: Angelica Bell MRN: [id number] Date of Birth: 1932-05-17   Medicare Important Message Given:  Yes     Tommy Medal 10/13/2020, 4:28 PM

## 2020-10-24 ENCOUNTER — Other Ambulatory Visit: Payer: Self-pay

## 2020-10-24 ENCOUNTER — Encounter: Payer: Self-pay | Admitting: Diagnostic Neuroimaging

## 2020-10-24 ENCOUNTER — Ambulatory Visit (INDEPENDENT_AMBULATORY_CARE_PROVIDER_SITE_OTHER): Payer: Medicare Other | Admitting: Diagnostic Neuroimaging

## 2020-10-24 VITALS — BP 163/83 | HR 73

## 2020-10-24 DIAGNOSIS — I6310 Cerebral infarction due to embolism of unspecified precerebral artery: Secondary | ICD-10-CM

## 2020-10-24 DIAGNOSIS — F03B18 Unspecified dementia, moderate, with other behavioral disturbance: Secondary | ICD-10-CM

## 2020-10-24 DIAGNOSIS — F0391 Unspecified dementia with behavioral disturbance: Secondary | ICD-10-CM | POA: Diagnosis not present

## 2020-10-24 NOTE — Progress Notes (Signed)
[facility]  PATIENT: Angelica Bell DOB: 12-28-1932  REFERRING CLINICIAN:  HISTORY FROM: patient and chart review REASON FOR VISIT: new consult   HISTORICAL  CHIEF COMPLAINT:  Chief Complaint  Patient presents with  . Cerebrovascular Accident    rm [place], at [facility] -transportation    HISTORY OF PRESENT ILLNESS:   UPDATE (10/24/20, VRP): Since last visit, was admitted for confusion and aphasia in September 2021.  Patient was diagnosed with acute ischemic infarction affecting left frontotemporal and occipital lobes.  Patient was discharged to skilled nursing facility.  She was readmitted in October 2021 for confusion and unresponsiveness.  Patient was diagnosed with UTI and metabolic encephalopathy.  She also had failure to thrive and protein calorie malnutrition. Patient was discharged to skilled nursing facility.    UPDATE 10/25/14: Since last visit patient did not have MRI done because she could not tolerate laying flat. Also patient's grandson and his girlfriend moved out of patient's home yesterday. Today patient tells me that she does not have any memory problems. She tells me that her grandson was also falsifiying information and claiming that she had memory problems in an attempt to take her house from her. Patient has taken her grandson off of her designated medical contact list. Patient asked me to call her daughter [name]) to discuss. I called patient's daughter and discussed symptoms and claims. Patient's daughter says that patient has no memory issues, cognitive decline or any problems with activities of daily living. Patient's daughter talks to patient and sees her almost on daily basis.  PRIOR HPI (06/27/14) 86 year old right-handed female here for evaluation of dementia. Patient accompanied by grandson. Patient denies any significant memory problems. Acknowledges some mild memory problems but does not know why this  appointment was set up. She feels cold in the exam room and is requesting to go home. She's complaining of back pain. According to grandson patient has been having 6-12 months of progressive short-term memory problems, paranoia, seeing things (bugs, worms) which has led to her digging into the walls to eradicate them. Patient expressing paranoia towards grandson and his girlfriend, claiming that they are taking things from her. Patient's grandson noted that patient tried putting microwave popcorn in the toaster, She also placed her clothes in an oven to dry them. Patient also having problems with driving. Last month she was lost while driving and the police called grandson to pick her up. Few months ago patient was parked in a bank parking lot for 2 hours, when security found her and called grandson to pick her up. Patient is able to bathe, eat, take her medicines. She has been paying her bills late. Bill collectors have been calling and leaving messages which the grandson has found. Patient lost her checkbook and check heart at the grocery store recently. Patient has hearing problems but has never been evaluated by audiology for hearing aid. Patient has not been evaluated for this memory problem recently. Apparently in the past her PCP had placed her on Aricept which she tolerated. However she no longer sees that PCP and she stopped taking Aricept.   REVIEW OF SYSTEMS: Full 14 system review of systems performed and negative except: as per HPI.    ALLERGIES: Allergies  Allergen Reactions  . Hctz [Hydrochlorothiazide]     Significant and rapid hyponatremia (TIH)  . Amoxicillin-Pot Clavulanate Hives and Diarrhea    Has patient had a PCN reaction causing immediate rash, facial/tongue/throat swelling, SOB or  lightheadedness with hypotension: yes Has patient had a PCN reaction causing severe rash involving mucus membranes or skin necrosis: yes Has patient had a PCN reaction that required hospitalization :  unknown Has patient had a PCN reaction occurring within the last 10 years: unknown If all of the above answers are "NO", then may proceed with Cephalosporin use.   Marland Kitchen Lisinopril-Hydrochlorothiazide Other (See Comments)    Tired- no energy to do anything..  . Sulfa Antibiotics Hives and Nausea And Vomiting  . Sulfonamide Derivatives Hives    HOME MEDICATIONS: Outpatient Medications Prior to Visit  Medication Sig Dispense Refill  . acetaminophen (TYLENOL) 500 MG tablet Take 500 mg by mouth every 6 (six) hours as needed for moderate pain.     Marland Kitchen amLODipine (NORVASC) 2.5 MG tablet Take 1 tablet (2.5 mg total) by mouth daily.    Marland Kitchen aspirin EC 81 MG EC tablet Take 1 tablet (81 mg total) by mouth daily. 30 tablet 0  . atorvastatin (LIPITOR) 40 MG tablet Take 1 tablet (40 mg total) by mouth daily.    Marland Kitchen buPROPion (WELLBUTRIN) 75 MG tablet Take 75 mg by mouth daily.    . cholecalciferol (VITAMIN D) 25 MCG tablet Take 1 tablet (1,000 Units total) by mouth daily.    . clopidogrel (PLAVIX) 75 MG tablet Take 1 tablet (75 mg total) by mouth daily. Stop after 25 days 25 tablet 0  . donepezil (ARICEPT) 5 MG tablet Take 5 mg by mouth daily.    . mirtazapine (REMERON) 7.5 MG tablet Take 1 tablet (7.5 mg total) by mouth at bedtime.    . Multiple Vitamin (MULTIVITAMIN WITH MINERALS) TABS tablet Take 1 tablet by mouth daily.    . Nutritional Supplements (RESOURCE 2.0) LIQD Take 60 mLs by mouth in the morning, at noon, and at bedtime.     No facility-administered medications prior to visit.    PAST MEDICAL HISTORY: Past Medical History:  Diagnosis Date  . Chronic back pain    with degenerative joint disease  . Chronic pain disorder    with narcotic management  . Dry eyes   . Frequent falls   . HOH (hard of hearing)   . Hypertension   . Multiple rib fractures 02/2020  . Osteoporosis   . PE (pulmonary embolism)    After hysterectomy in 1967  . Pneumonia   . Rhabdomyolysis   . Stroke [facility])      PAST SURGICAL HISTORY: Past Surgical History:  Procedure Laterality Date  . ABDOMINAL HYSTERECTOMY    . APPENDECTOMY    . BREAST ENHANCEMENT SURGERY    . CATARACT EXTRACTION    . FEMUR IM NAIL Left 02/17/2017   Procedure: INTRAMEDULLARY (IM) NAIL FEMORAL;  Surgeon: Nicholes Stairs, MD;  Location: [place];  Service: Orthopedics;  Laterality: Left;  . HIP ARTHROPLASTY Right 12/24/2015   Procedure: ARTHROPLASTY BIPOLAR HIP (HEMIARTHROPLASTY);  Surgeon: Netta Cedars, MD;  Location: [facility];  Service: Orthopedics;  Laterality: Right;  . TONSILLECTOMY      FAMILY HISTORY: Family History  Problem Relation Age of Onset  . Heart Problems Mother     SOCIAL HISTORY:  Social History   Socioeconomic History  . Marital status: Widowed    Spouse name: Not on file  . Number of children: 3  . Years of education: HS  . Highest education level: Not on file  Occupational History  . Occupation: Retired    Fish farm manager: RETIRED  Tobacco Use  . Smoking status: Former Smoker  Years: 13.00    Quit date: 04/15/1976    Years since quitting: 44.5  . Smokeless tobacco: Never Used  Vaping Use  . Vaping Use: Never used  Substance and Sexual Activity  . Alcohol use: No  . Drug use: No  . Sexual activity: Not Currently  Other Topics Concern  . Not on file  Social History Narrative   Patient lives at home with her grandson.   Caffeine Use: none   Social Determinants of Health   Financial Resource Strain:   . Difficulty of Paying Living Expenses: Not on file  Food Insecurity:   . Worried About Charity fundraiser in the Last Year: Not on file  . Ran Out of Food in the Last Year: Not on file  Transportation Needs:   . Lack of Transportation (Medical): Not on file  . Lack of Transportation (Non-Medical): Not on file  Physical Activity:   . Days of Exercise per Week: Not on file  . Minutes of Exercise per Session: Not on file  Stress:   . Feeling of Stress : Not on file  Social  Connections:   . Frequency of Communication with Friends and Family: Not on file  . Frequency of Social Gatherings with Friends and Family: Not on file  . Attends Religious Services: Not on file  . Active Member of Clubs or Organizations: Not on file  . Attends Archivist Meetings: Not on file  . Marital Status: Not on file  Intimate Partner Violence:   . Fear of Current or Ex-Partner: Not on file  . Emotionally Abused: Not on file  . Physically Abused: Not on file  . Sexually Abused: Not on file     PHYSICAL EXAM  GENERAL EXAM/CONSTITUTIONAL: Vitals:  Vitals:   10/24/20 0916  BP: (!) 163/83  Pulse: 73     There is no height or weight on file to calculate BMI. Wt Readings from Last 3 Encounters:  10/12/20 97 lb (44 kg)  07/31/20 105 lb 13.1 oz (48 kg)  05/12/20 97 lb (44 kg)     Patient is in no distress; FRAIL, ELDERLY FEMALE  CARDIOVASCULAR:  Regular rate and rhythm, no murmurs  Examination of peripheral vascular system by observation and palpation is normal  NEUROLOGIC: MENTAL STATUS:  No flowsheet data found.  awake, alert, oriented to person  [facility] memory   DECR attention and concentration  DECR FLUENCY  CRANIAL NERVE:   2nd, 3rd, 4th, 6th - RIGHT PUPIL IRREG, NO REACTION; LEFT PUPIL 2MM REACTIVE; DECR RIGHT VISUAL FIELD; extraocular muscles intact, no nystagmus  5th - facial sensation symmetric  7th - facial strength --> DECR RIGHT LOWER FACIAL STRENGTH  8th - hearing intact  9th - palate elevates symmetrically, uvula midline  11th - shoulder shrug symmetric  12th - tongue protrusion midline  MOTOR:   RUE 2; LUE 3  RLE 2; LLE 3  SENSORY:   normal and symmetric to light touch  COORDINATION:   finger-nose-finger, fine finger movements SLOW ON LEFT; LIMITED ON RIGHT DUE TO WEAKNESS  REFLEXES:   deep tendon reflexes TRACE and symmetric  GAIT/STATION:   IN TRANSPORT CHAIR       DIAGNOSTIC DATA (LABS, IMAGING,  TESTING) - I reviewed patient records, labs, notes, testing and imaging myself where available.  Lab Results  Component Value Date   WBC 10.1 10/11/2020   HGB 12.1 10/11/2020   HCT 38.8 10/11/2020   MCV 89.0 10/11/2020   PLT 343 10/11/2020  Component Value Date/Time   NA 140 10/11/2020 0450   NA 138 02/24/2017 0000   K 3.6 10/11/2020 0450   CL 103 10/11/2020 0450   CO2 29 10/11/2020 0450   GLUCOSE 107 (H) 10/11/2020 0450   BUN 20 10/11/2020 0450   BUN 15 02/24/2017 0000   CREATININE 0.58 10/11/2020 0450   CALCIUM 10.2 10/11/2020 0450   PROT 5.6 (L) 10/11/2020 0450   ALBUMIN 2.9 (L) 10/11/2020 0450   AST 18 10/11/2020 0450   ALT 16 10/11/2020 0450   ALKPHOS 87 10/11/2020 0450   BILITOT 0.3 10/11/2020 0450   GFRNONAA >60 10/11/2020 0450   GFRAA >60 09/05/2020 0735   Lab Results  Component Value Date   CHOL 136 10/10/2020   HDL 57 10/10/2020   LDLCALC 64 10/10/2020   TRIG 75 10/10/2020   CHOLHDL 2.4 10/10/2020   Lab Results  Component Value Date   HGBA1C 6.0 (H) 08/31/2020   Lab Results  Component Value Date   VITAMINB12 342 08/31/2020   Lab Results  Component Value Date   TSH 0.964 08/31/2020    01/27/14 CT head - mild-mod perisylvian atrophy, mild chronic small vessel ischemic disease   10/11/20 MRI brain [I reviewed images myself and agree with interpretation. -VRP]  1. Motion degraded examination. 2. 3.4 x 3.4 cm infarction within the left frontoparietal white matter which has increased in extent since the prior MRI of 08/31/2020, but appears predominantly late subacute to chronic. There is a small region at the anterior aspect of this infarct which demonstrates true restricted diffusion and may reflect an acute/early subacute component. 3. Adjacent late subacute to chronic cortically based infarction changes within the left frontoparietal lobes, posterior left insula and left frontal operculum which have not significantly changed in extent since  the prior MRI. 4. Stable background mild generalized parenchymal atrophy and moderate chronic small vessel ischemic disease. Redemonstrated chronic infarcts within the bilateral cerebral white matter, right basal ganglia and right cerebellum. 5. Unchanged from the prior MRI, there is T2/FLAIR hyperintense signal abnormality within the left transverse and sigmoid dural venous sinuses. This may be due to slow flow. However, a dural venous sinus thrombosis cannot be excluded. Consider CT or MR venography for further evaluation. 6. Incidentally noted 5 mm meningioma overlying the right frontal convexity.  10/11/20 MRA head: 1. Motion degraded examination. 2. No intracranial large vessel occlusion. 3. Unchanged moderate/severe focal stenosis within a distal inferior division M2 left MCA branch vessel. 4. Unchanged sites of moderate/severe stenosis within a proximal to mid small superior division M2 left MCA branch vessel.  10/11/20 Carotid u/s Mild amount of plaque at the level of bilateral carotid bulbs and proximal internal carotid arteries. No significant carotid stenosis identified with estimated bilateral ICA stenoses of less than 50%.  10/11/20 TTE 1. Very limited images.  2. Left ventricular ejection fraction, by estimation, is 55 to 60%. The  left ventricle has normal function. Left ventricular endocardial border  not optimally defined to evaluate regional wall motion.  3. The mitral valve is abnormal, mildly thickened and calcified and with  mild to moderate annular calcification. Trivial mitral valve  regurgitation.      ASSESSMENT AND PLAN  86 y.o. year old female here with reported progressive short-term memory problems, paranoia, behavior change, getting loss, confusion since 2014. Now with embolic strokes in 8185.   Dx:   1. Moderate dementia with behavioral disturbance ([place])   2. Cerebrovascular accident (CVA) due to embolism of precerebral artery ([place])  PLAN:  EMBOLIC STROKES - continue aspirin / plavix, statin, BP control  DEMENTIA (moderate-severe) - supportive care; donepezil, wellbutrin, remeron; consider palliative care consult  Return for return to PCP.    Penni Bombard, MD 50/56/9794, 8:01 AM Certified in Neurology, Neurophysiology and Neuroimaging  [facility] [address] [place], [place] [address] [phone]

## 2020-10-26 ENCOUNTER — Other Ambulatory Visit: Payer: Self-pay | Admitting: Medical

## 2020-10-26 DIAGNOSIS — I4891 Unspecified atrial fibrillation: Secondary | ICD-10-CM

## 2020-10-26 DIAGNOSIS — I639 Cerebral infarction, unspecified: Secondary | ICD-10-CM

## 2020-11-05 IMAGING — CT CT CHEST W/ CM
2 of 5 series · 12 of 36 positions shown, 15 images · IV contrast (OMNIPAQUE)
Comparison: 08/31/2019

CLINICAL DATA: Fell out of bed this morning. Patient complaining of
right chest pain.

EXAM:
CT CHEST, ABDOMEN, AND PELVIS WITH CONTRAST
TECHNIQUE: Multidetector CT imaging of the chest, abdomen and pelvis was
performed following the standard protocol during bolus
administration of intravenous contrast.
CONTRAST:  75mL OMNIPAQUE IOHEXOL 300 MG/ML  SOLN

[Series 2: cap with · axial · 0.73mm/px · z∈[-453,+22]mm · 9 of 119 slices shown, 12 images]
[im 12/119  mediastinal]
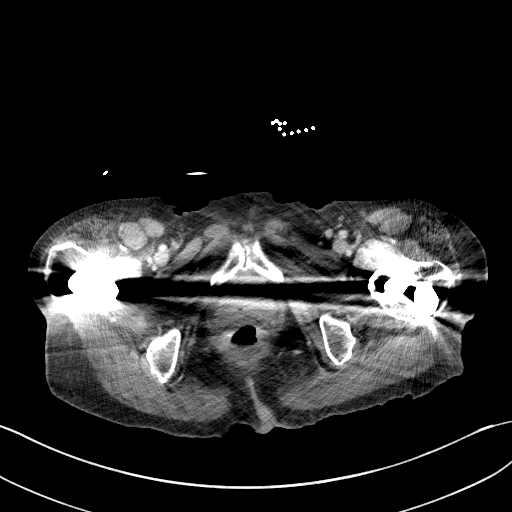
[im 12/119  lung]
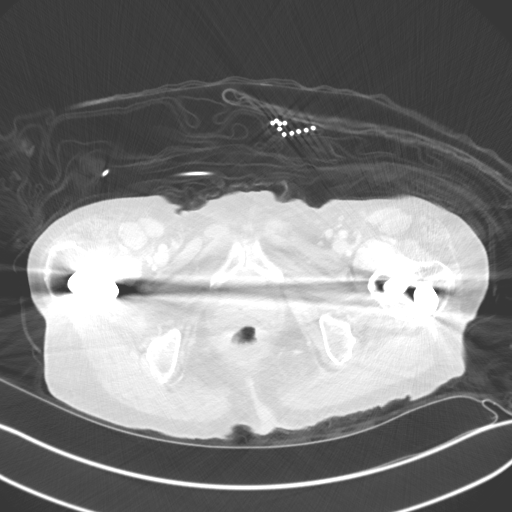
[im 24/119  lung]
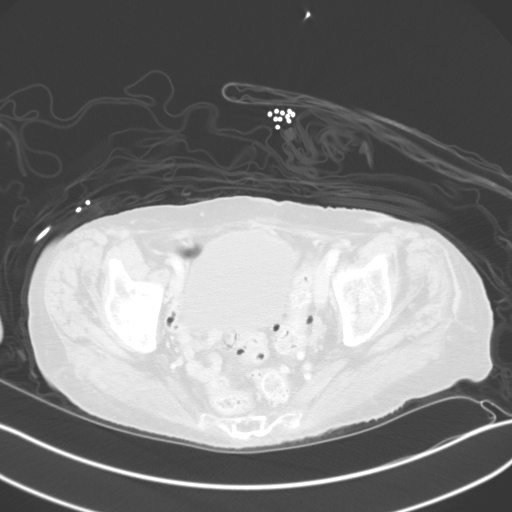
[im 36/119  lung]
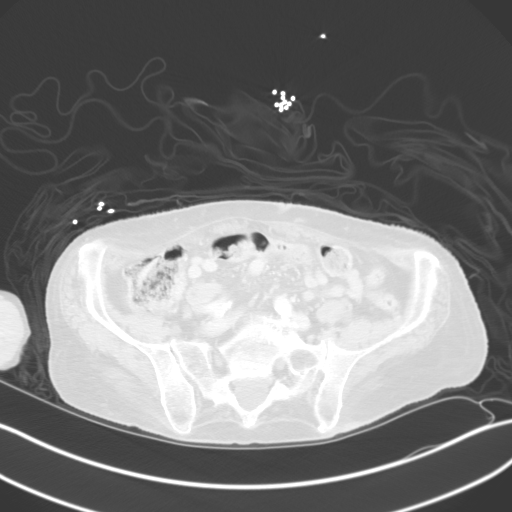
[im 48/119  lung]
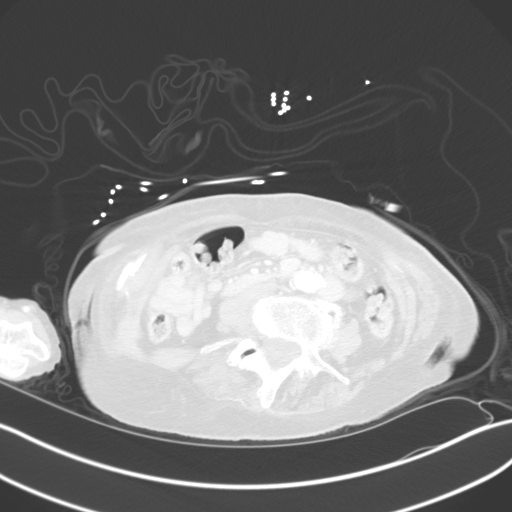
[im 60/119  mediastinal]
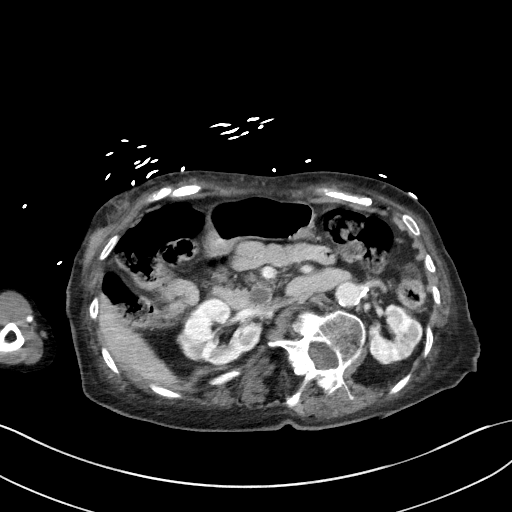
[im 60/119  lung]
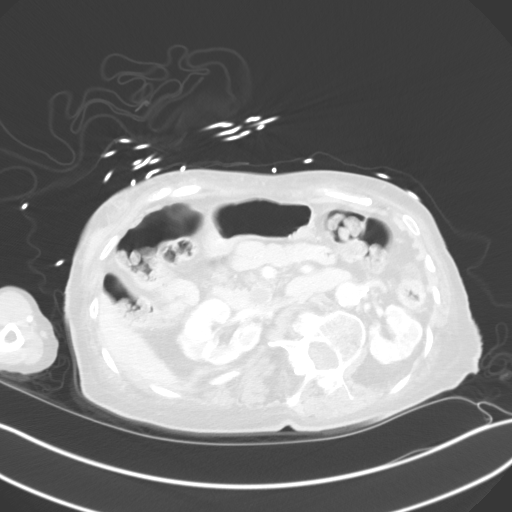
[im 71/119  lung]
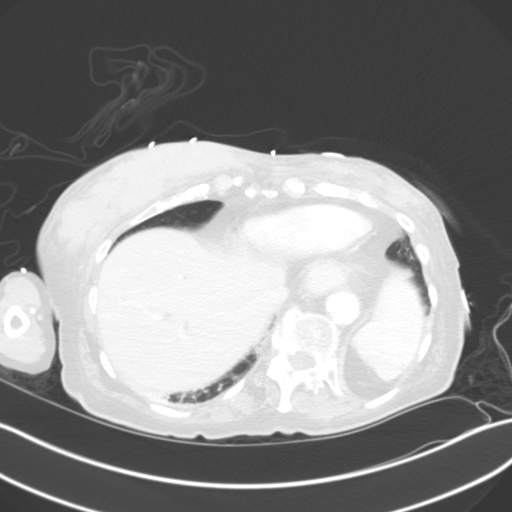
[im 83/119  lung]
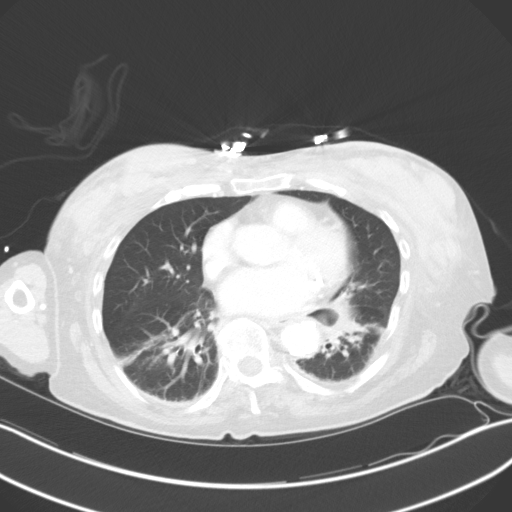
[im 95/119  lung]
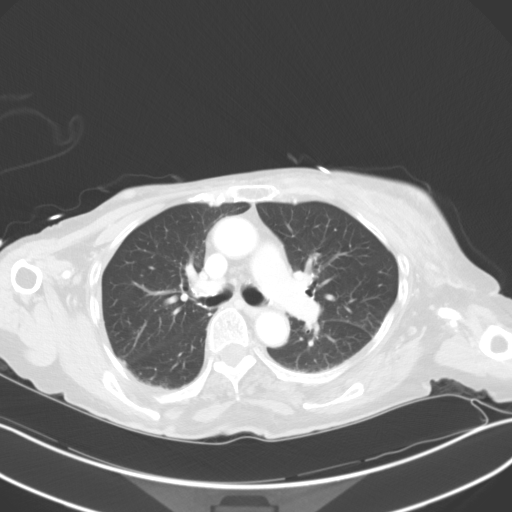
[im 107/119  mediastinal]
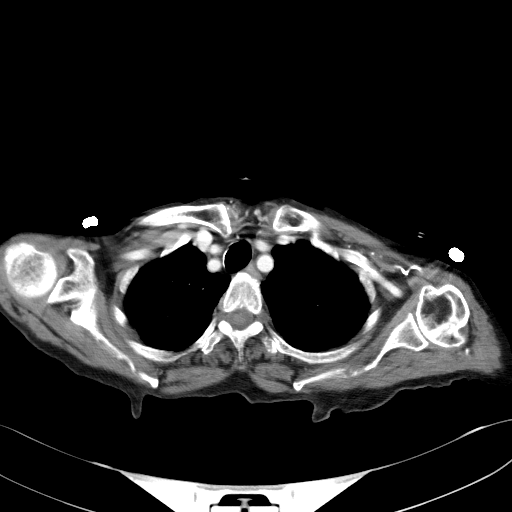
[im 107/119  lung]
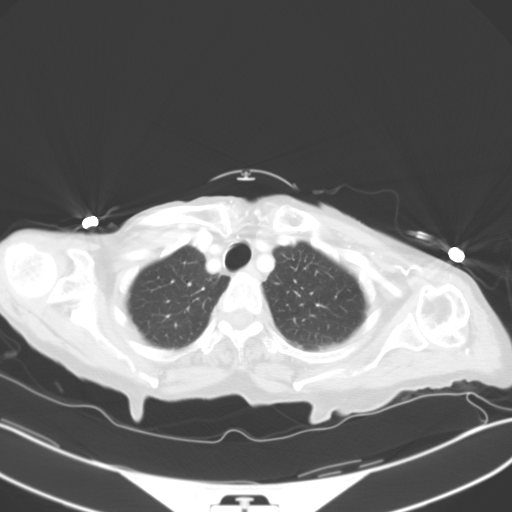

[Series 4: coronals · coronal · 0.82mm/px · 3 of 129 slices shown]
[im 26/129  lung]
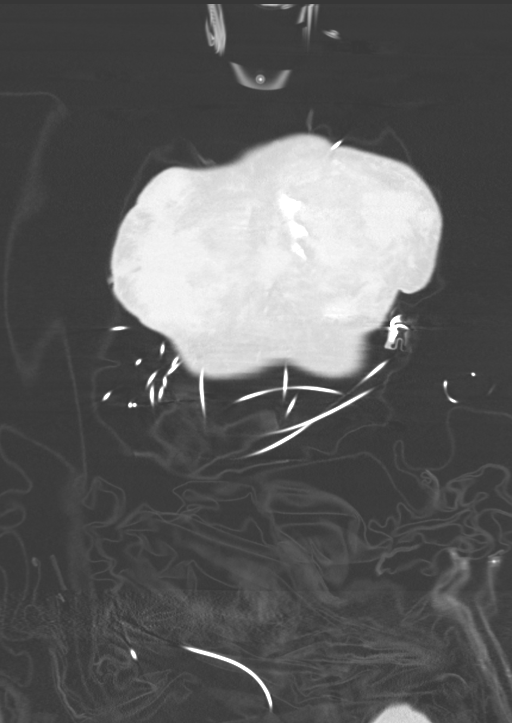
[im 52/129  lung]
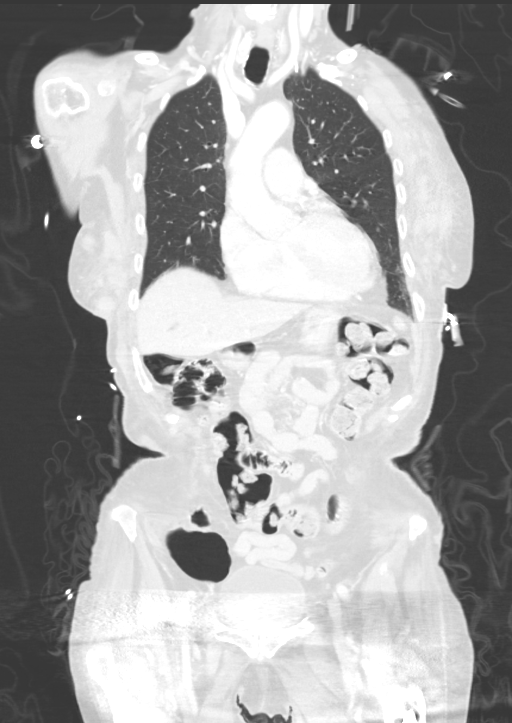
[im 77/129  lung]
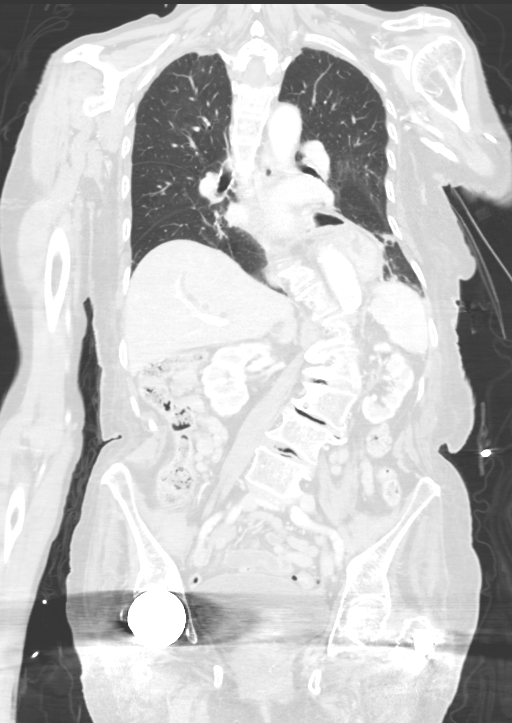

[12 of 36 positions shown; findings below may reference images not displayed]

FINDINGS: CT CHEST FINDINGS

Cardiovascular: The heart is normal in size. No pericardial
effusion. There is mild tortuosity, ectasia and calcification of the
thoracic aorta but no aneurysm or dissection. Three-vessel coronary
artery calcifications are noted.

Mediastinum/Nodes: No mediastinal or hilar mass or adenopathy or
hematoma. The esophagus is grossly normal. Small scattered thyroid
nodules are stable.

Lungs/Pleura: No acute pulmonary findings. No pulmonary contusion,
pneumothorax or pleural effusion. There is streaky bibasilar
atelectasis and focal eventration of the right hemidiaphragm. No
worrisome pulmonary lesions. The central tracheobronchial tree is
unremarkable.

Musculoskeletal: There are fourth, fifth and right sixth lateral rib
fractures. These are minimally displaced. No definite left-sided rib
fractures. The sternum is intact.

Scoliosis and degenerative changes involving the thoracic spine but
no acute fracture.

Extensive soft tissue density involving both breasts. This is
unchanged since the prior CT scan and I suspected some type of
remote silicone injections.

CT ABDOMEN PELVIS FINDINGS

Hepatobiliary: No focal hepatic lesions or evidence of acute hepatic
injury. No perihepatic fluid collections. Gallbladder is grossly
normal. There is intra and extrahepatic biliary dilatation. This is
new since the CT scan from August 2019. The common bile duct
measures a maximum of 13.5 mm in the head of the pancreas. I do not
see an obvious common bile duct stone, ampullary region or
pancreatic head mass to account for the dilatation. There could be a
distal stricture. Recommend correlation with liver function studies.
Patient probably not a good candidate for MRCP given limited breath
holding for the CT scan.

Pancreas: No mass, inflammation or ductal dilatation. Moderate
pancreatic atrophy.

Spleen: Normal size. No focal lesions.

Adrenals/Urinary Tract: The adrenal glands are unremarkable.

The left kidney is small and demonstrates renal cortical thinning.
No renal lesions or hydronephrosis. The bladder is unremarkable.

Stomach/Bowel: The stomach, duodenum, small bowel and colon are
grossly normal without oral contrast. No acute inflammatory changes,
mass lesions or obstructive findings.

Vascular/Lymphatic: Advanced atherosclerotic calcifications
involving the tortuous abdominal aorta but no aneurysm. The branch
vessels are patent. The major venous structures are patent. No
mesenteric or retroperitoneal mass or adenopathy.

Reproductive: Surgically absent.

Other: No pelvic mass or free pelvic fluid collections. Moderate
artifact through the lower pelvis due to bilateral hip hardware.

Musculoskeletal: Severe scoliosis and associated degenerative
changes. No acute bony findings or destructive bony changes.
IMPRESSION: 1. Right fourth, fifth and sixth lateral rib fractures. No
pneumothorax or pulmonary contusion.
2. No acute abdominal/pelvic findings, mass lesions or adenopathy.
3. Intra and extrahepatic biliary dilatation, new since CT scan of
August 2019. No obvious obstructing common bile duct stone,
ampullary lesion or pancreatic head mass. Recommend correlation with
liver function studies. Patient probably not a good candidate for
MRCP given limited breath holding for the CT scan.
4. Advanced atherosclerotic calcifications involving the thoracic
and abdominal aorta and branch vessels including the coronary
arteries.

Aortic Atherosclerosis ([id number]).

## 2020-11-05 IMAGING — US US ABDOMEN LIMITED
1 series · 14 of 18 positions shown · non-contrast
Comparison: CT chest abdomen pelvis earlier this day. Patient
apparently fell out of bed. Right rib fractures.

CLINICAL DATA: Dilated bile duct.

EXAM:
ULTRASOUND ABDOMEN LIMITED RIGHT UPPER QUADRANT

[Series 1: us abdomen limited · 14 of 18 slices shown]
[im 1/18]
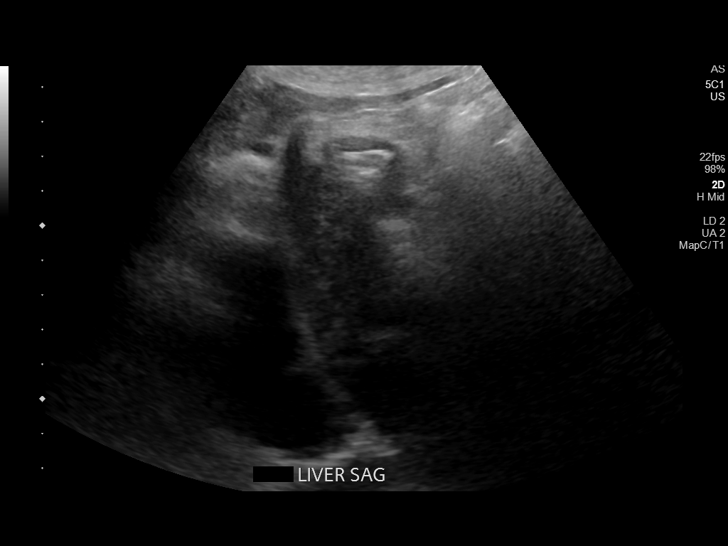
[im 2/18]
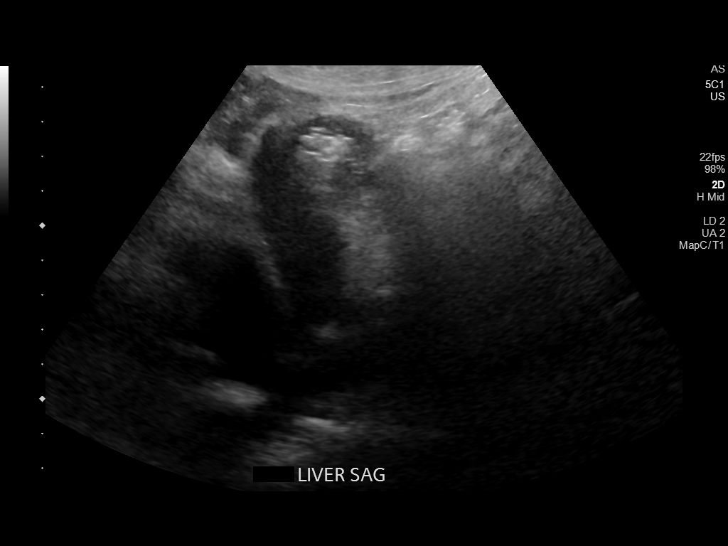
[im 4/18]
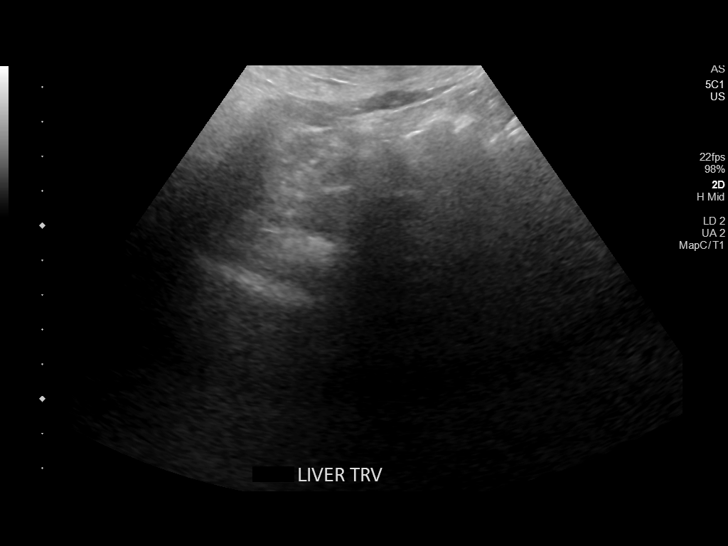
[im 5/18]
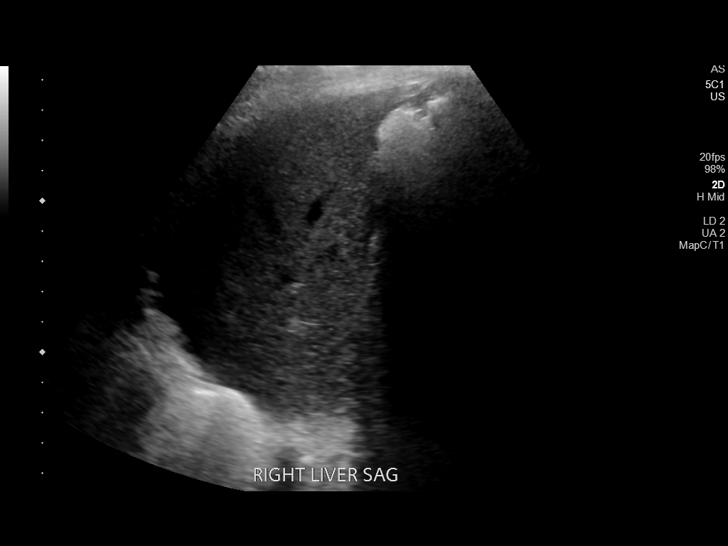
[im 6/18]
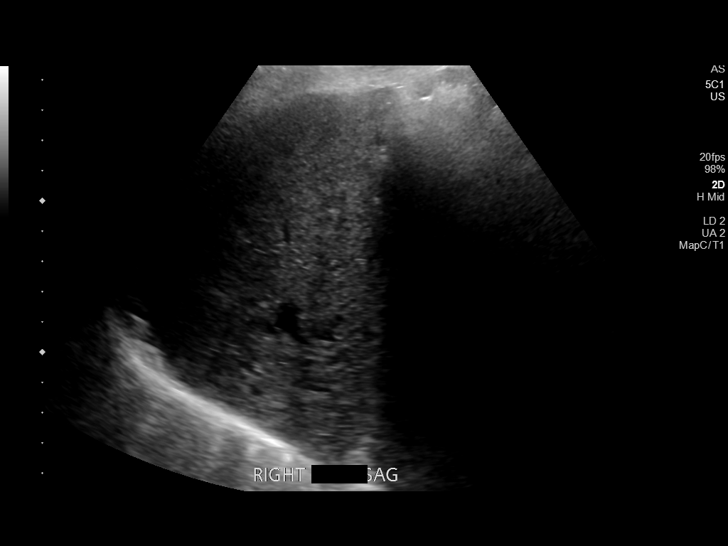
[im 8/18]
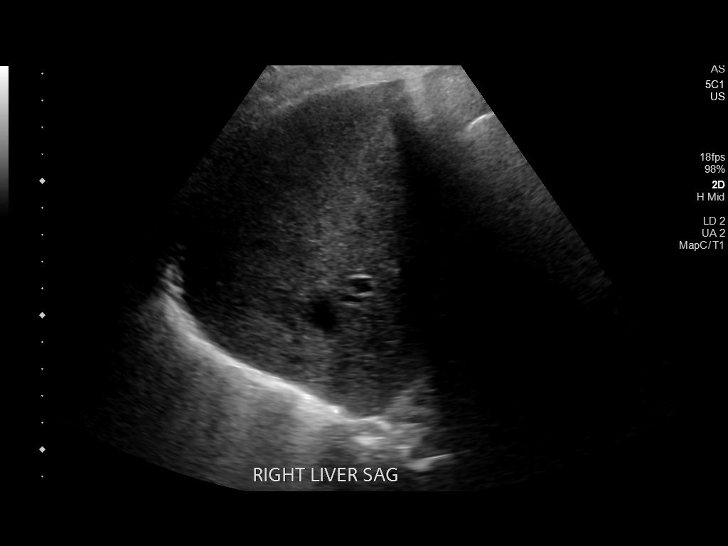
[im 9/18]
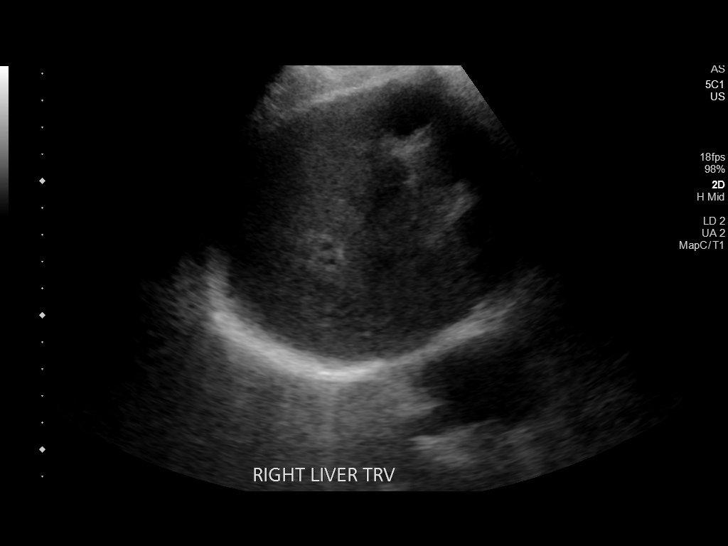
[im 10/18]
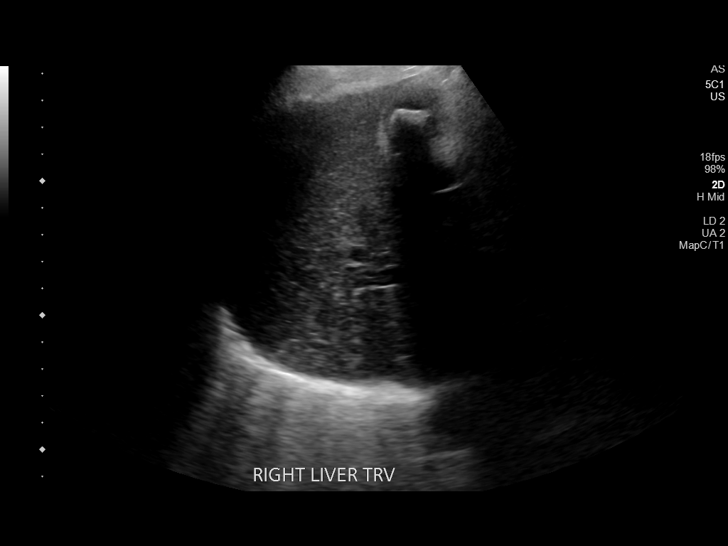
[im 11/18]
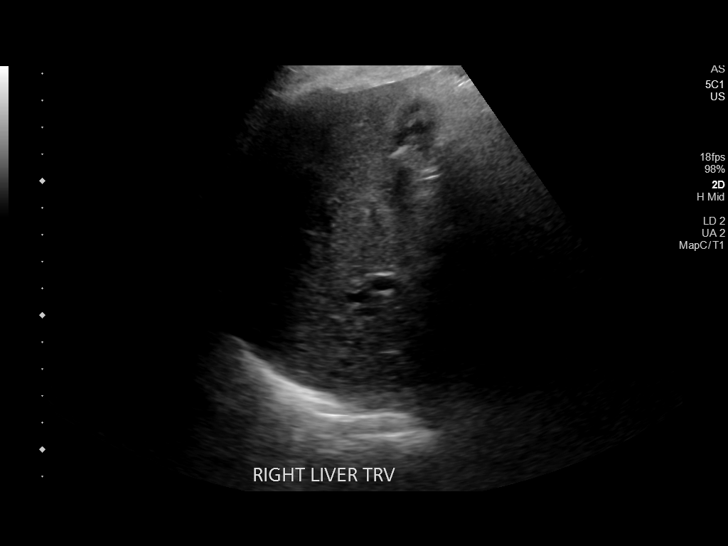
[im 13/18]
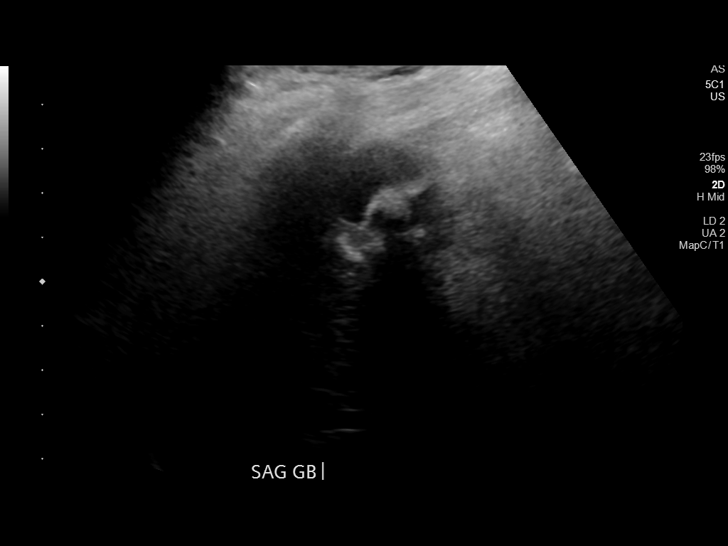
[im 14/18]
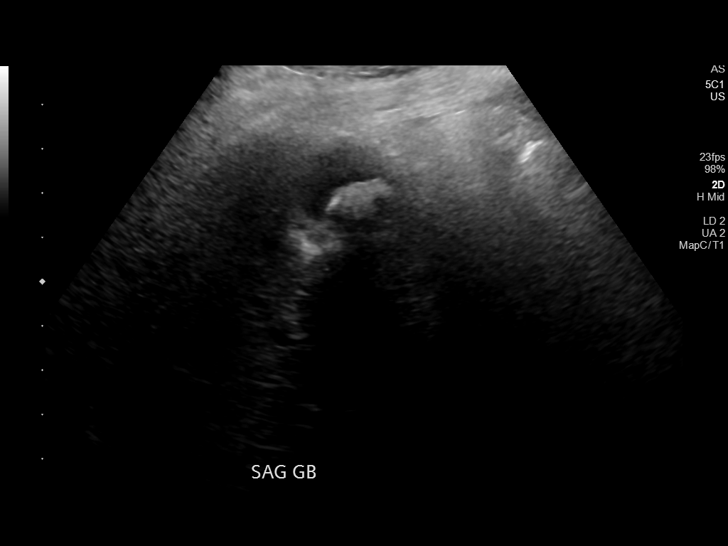
[im 15/18]
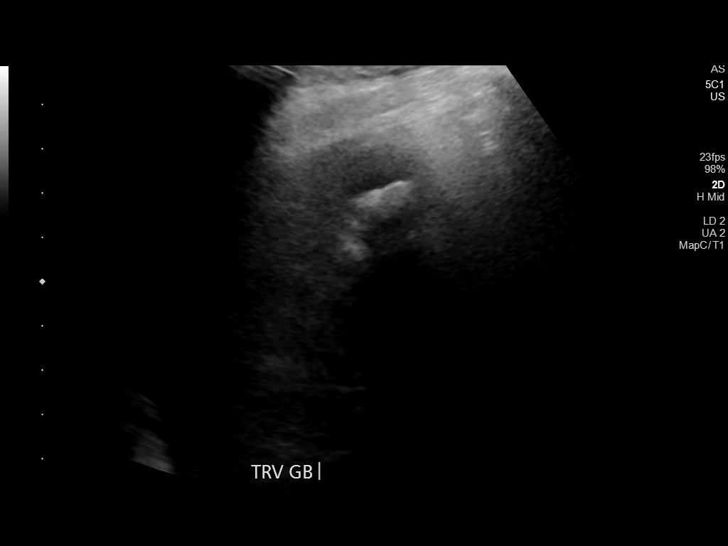
[im 17/18]
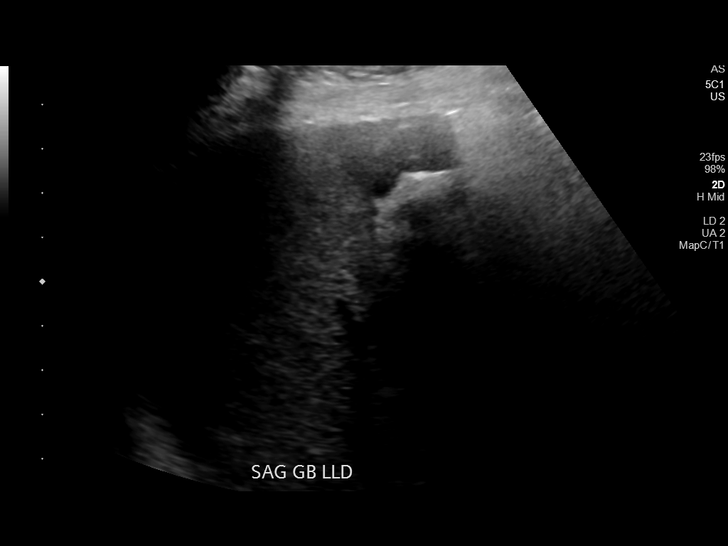
[im 18/18]
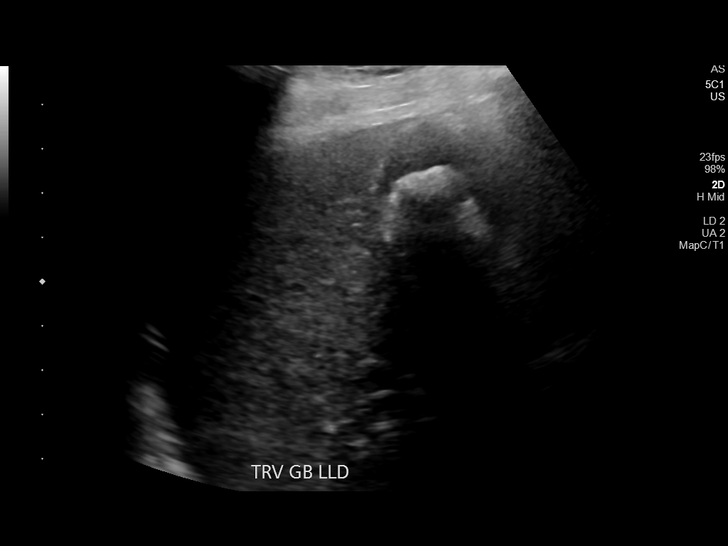

[14 of 18 positions shown; findings below may reference images not displayed]

FINDINGS: Patient had difficulty tolerating the exam due to pain. Examination
is of limited diagnostic utility.

Gallbladder:

Appears physiologically distended. Probable intraluminal gallstone.
No sonographic Murphy sign noted by sonographer.

Common bile duct:

Not demonstrated due to pain and motion.

Liver:

Limited assessment of liver parenchyma. No obvious focal lesion.
Portal vein could not be evaluated due to patient's pain.

Other: No right upper quadrant free fluid.
IMPRESSION: 1. Technically limited exam, of limited diagnostic utility. Patient
did not tolerate exam due to acute right rib fractures.
2. Biliary ductal dilatation on CT is not well-visualized or further
evaluated due to patient condition.
3. Probable gallstones without gallbladder inflammation.
4. Recommend follow-up exam after resolution of acute event when
patient is better able to tolerate probe pressure.

## 2020-11-05 IMAGING — CT CT HEAD W/O CM
3 series · 14 of 47 positions shown, 16 images · non-contrast
Comparison: February 05, 2020.

CLINICAL DATA: Unwitnessed fall. No loss of consciousness.

EXAM:
CT HEAD WITHOUT CONTRAST
CT CERVICAL SPINE WITHOUT CONTRAST
TECHNIQUE: Multidetector CT imaging of the head and cervical spine was
performed following the standard protocol without intravenous
contrast. Multiplanar CT image reconstructions of the cervical spine
were also generated.

[Series 3: head wo · axial · 0.47mm/px · z∈[-155,-30]mm · 8 of 30 slices shown, 10 images]
[im 3/30  brain]
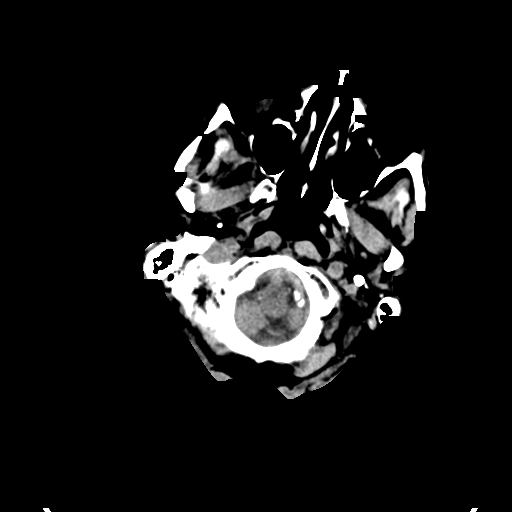
[im 3/30  bone]
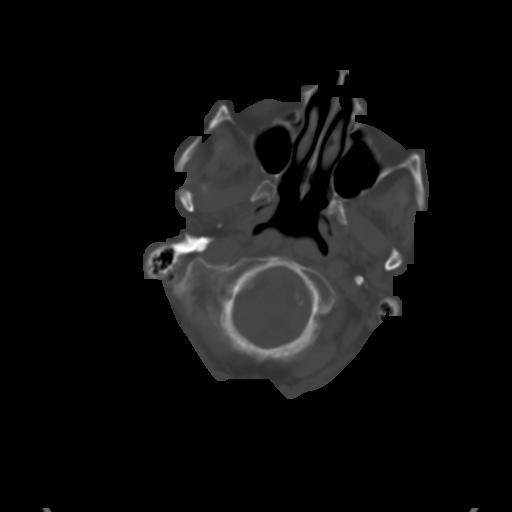
[im 7/30  brain]
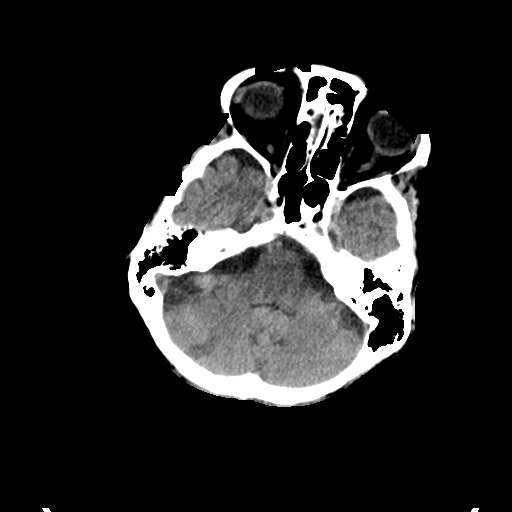
[im 10/30  brain]
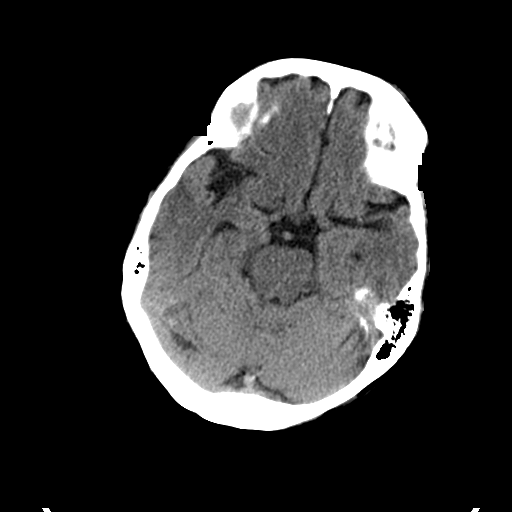
[im 14/30  brain]
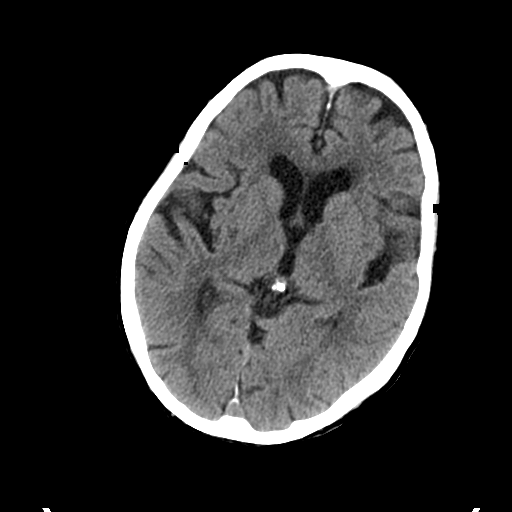
[im 17/30  brain]
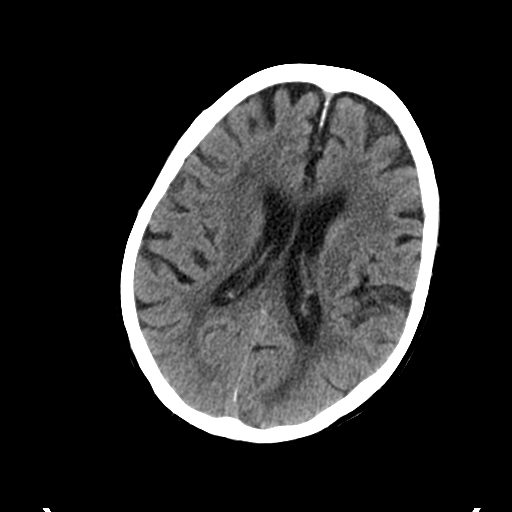
[im 17/30  bone]
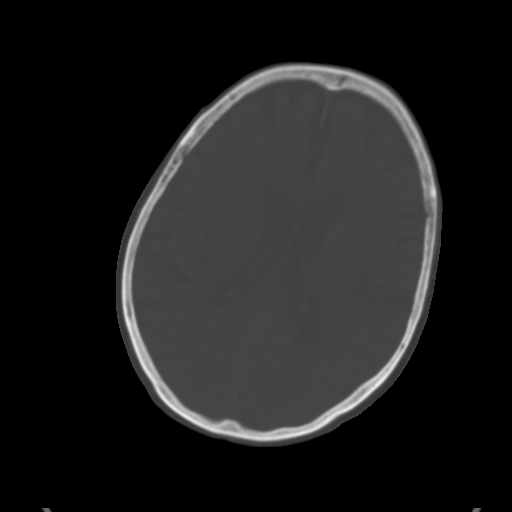
[im 21/30  brain]
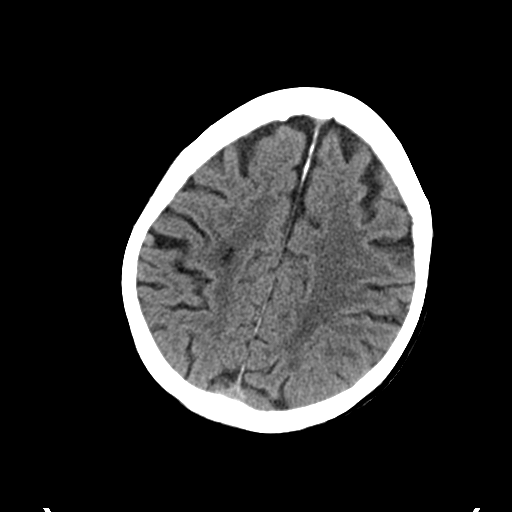
[im 24/30  brain]
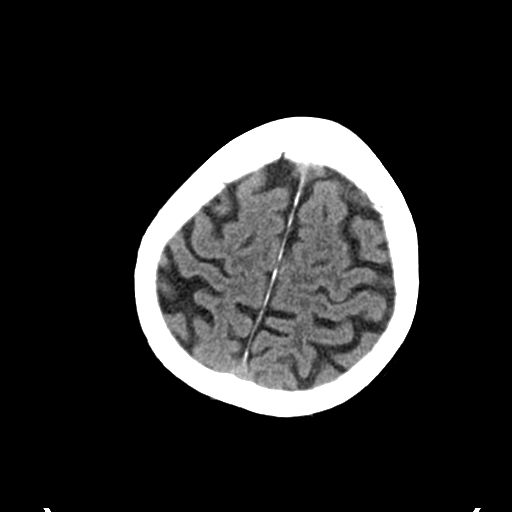
[im 28/30  brain]
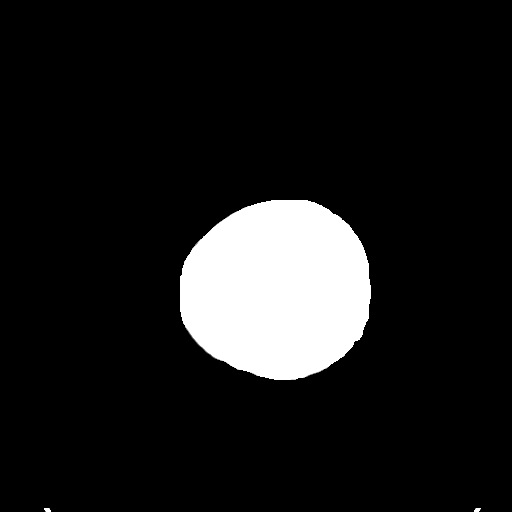

[Series 5: coronal soft tissue · coronal · 0.29mm/px · 3 of 69 slices shown]
[im 23/69  brain]
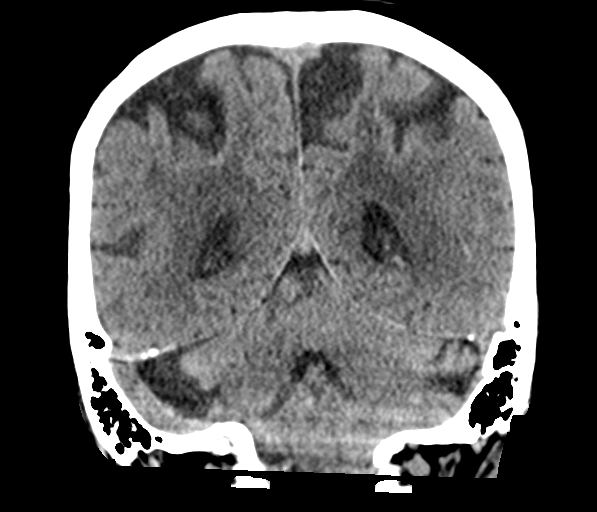
[im 31/69  brain]
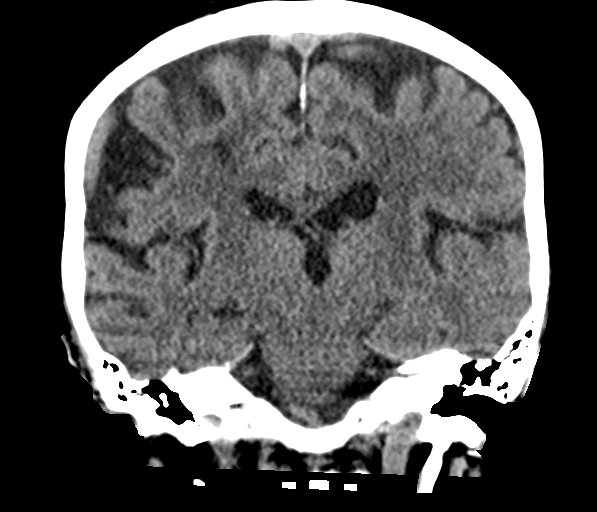
[im 38/69  brain]
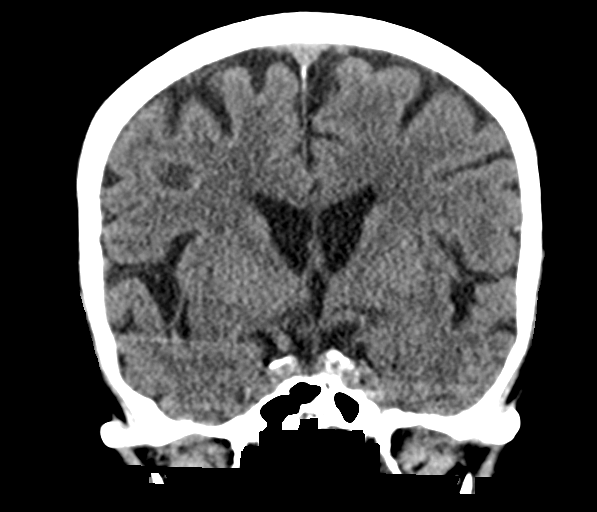

[Series 6: sagittal soft tissue · sagittal · 0.30mm/px · 3 of 58 slices shown]
[im 20/58  brain]
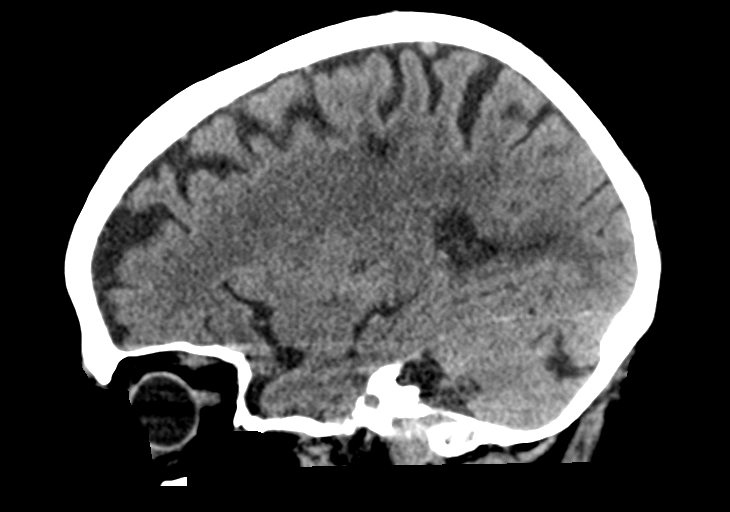
[im 29/58  brain]
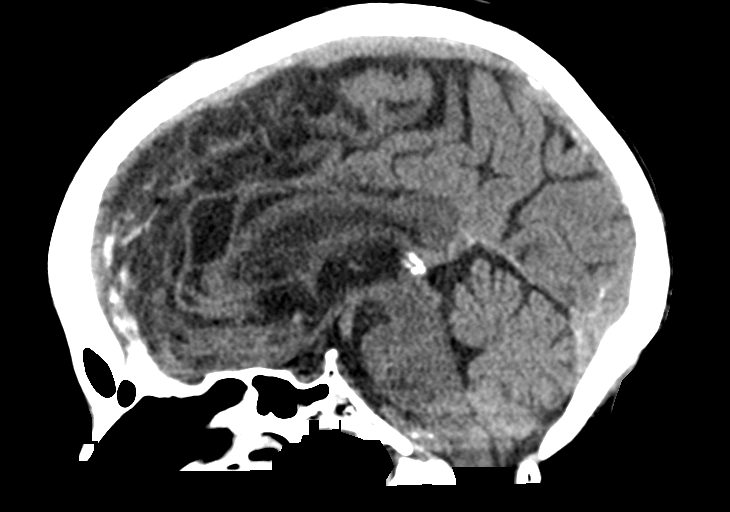
[im 39/58  brain]
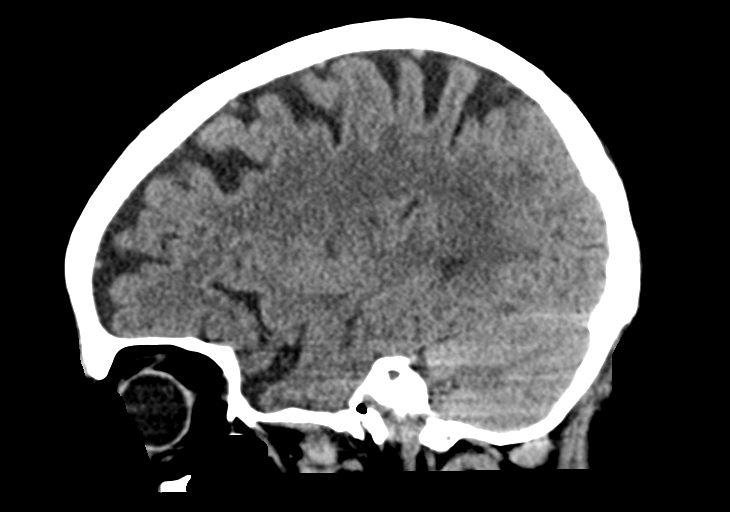

[14 of 47 positions shown; findings below may reference images not displayed]

FINDINGS: CT HEAD FINDINGS

Brain: Mild diffuse cortical atrophy is noted. Mild chronic ischemic
white matter disease is noted. Old right cerebellar infarction is
noted. No mass effect or midline shift is noted. Ventricular size is
within normal limits. There is no evidence of mass lesion,
hemorrhage or acute infarction.

Vascular: No hyperdense vessel or unexpected calcification.

Skull: Normal. Negative for fracture or focal lesion.

Sinuses/Orbits: No acute finding.

Other: None.

CT CERVICAL SPINE FINDINGS

Alignment: Normal.

Skull base and vertebrae: No acute fracture. No primary bone lesion
or focal pathologic process.

Soft tissues and spinal canal: No prevertebral fluid or swelling. No
visible canal hematoma.

Disc levels: Severe degenerative disc disease is noted at C4-5, C5-6
and C6-7 with anterior osteophyte formation.

Upper chest: Negative.

Other: None.
IMPRESSION: 1. Mild diffuse cortical atrophy. Mild chronic ischemic white matter
disease. No acute intracranial abnormality seen.
2. Severe multilevel degenerative disc disease. No acute abnormality
seen in the cervical spine.

## 2020-11-05 IMAGING — CT CT ABD-PELV W/ CM
3 of 5 series · 14 of 36 positions shown, 16 images · IV contrast (omnipaque)
Comparison: 08/31/2019

CLINICAL DATA: Fell out of bed this morning. Patient complaining of
right chest pain.

EXAM:
CT CHEST, ABDOMEN, AND PELVIS WITH CONTRAST
TECHNIQUE: Multidetector CT imaging of the chest, abdomen and pelvis was
performed following the standard protocol during bolus
administration of intravenous contrast.
CONTRAST:  75mL OMNIPAQUE IOHEXOL 300 MG/ML  SOLN

[Series 2: cap with · axial · 0.73mm/px · z∈[-393,+22]mm · 8 of 119 slices shown, 10 images]
[im 24/119  mediastinal]
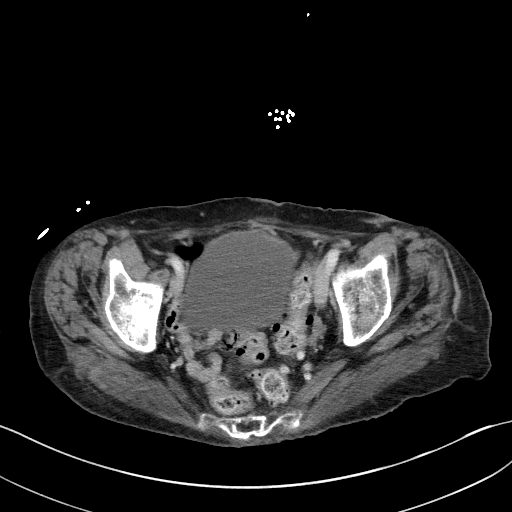
[im 24/119  lung]
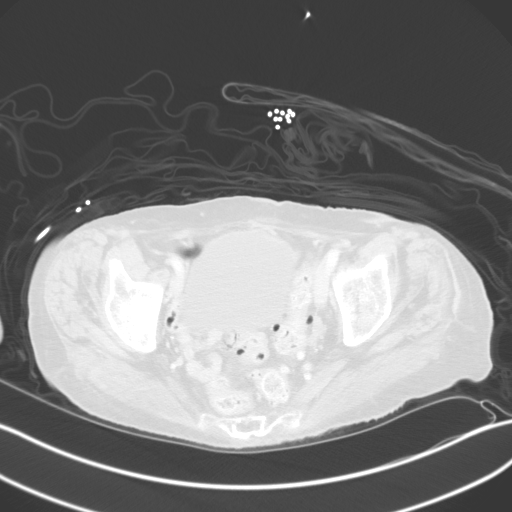
[im 36/119  lung]
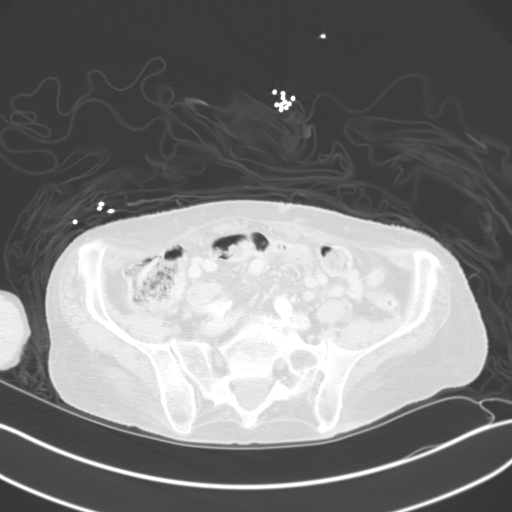
[im 48/119  lung]
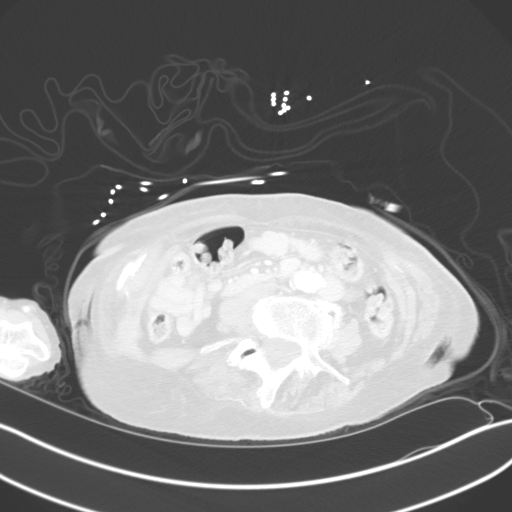
[im 60/119  lung]
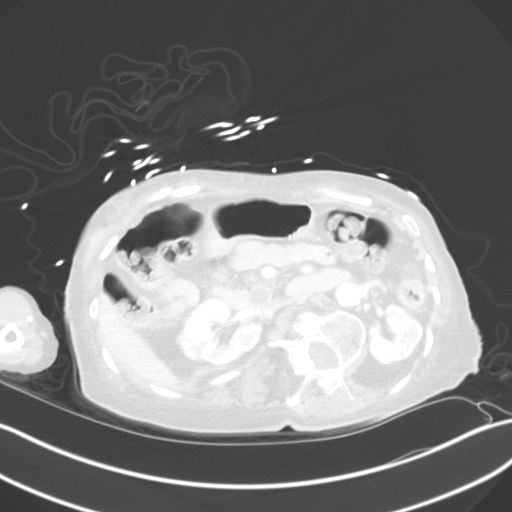
[im 71/119  mediastinal]
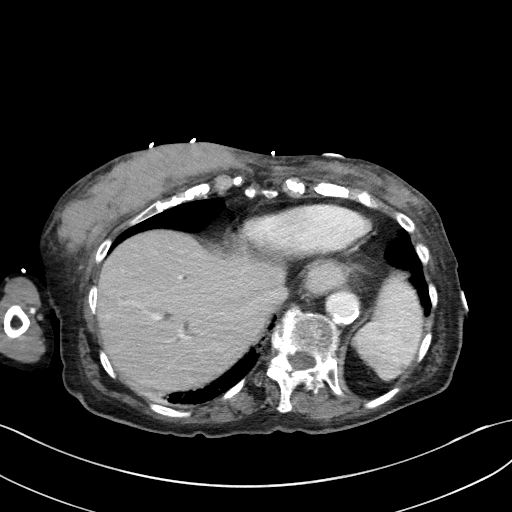
[im 71/119  lung]
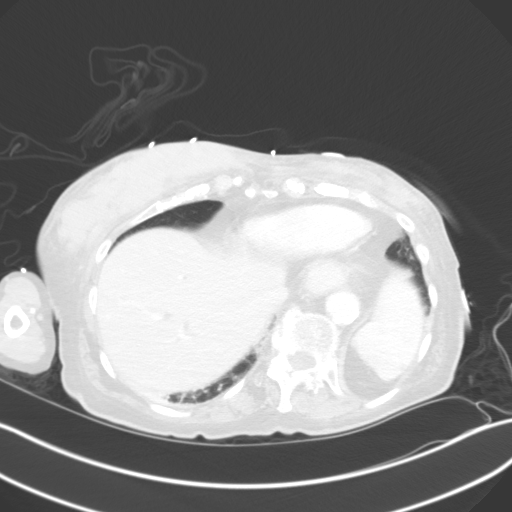
[im 83/119  lung]
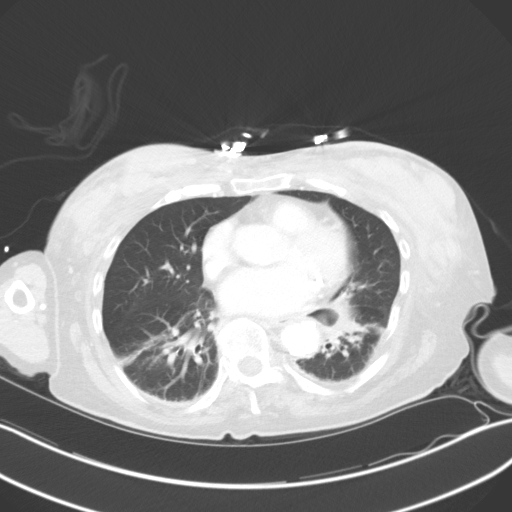
[im 95/119  lung]
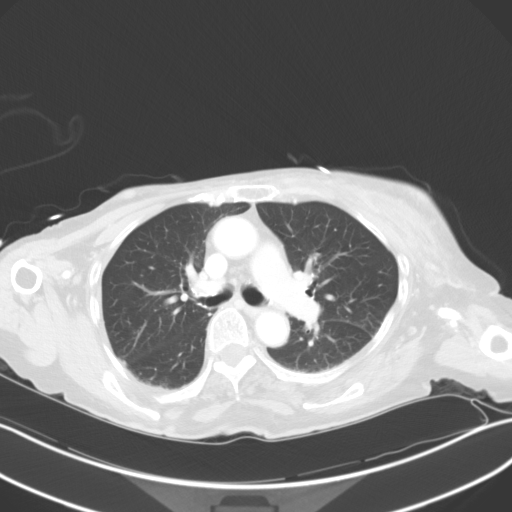
[im 107/119  lung]
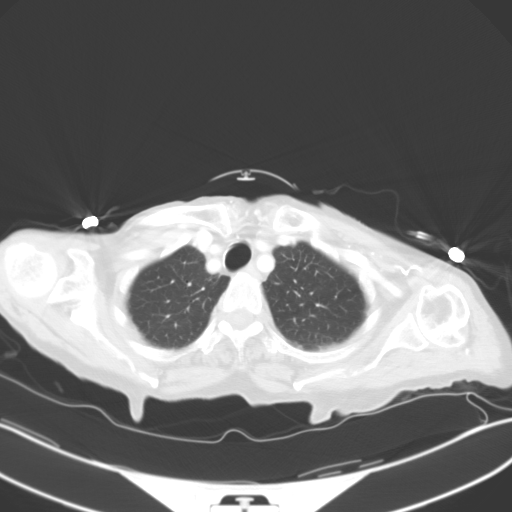

[Series 4: coronals · coronal · 0.82mm/px · 3 of 129 slices shown]
[im 26/129  lung]
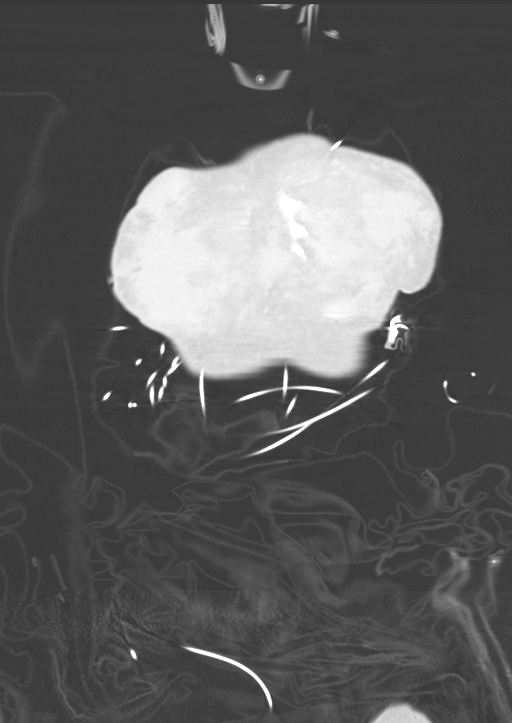
[im 52/129  lung]
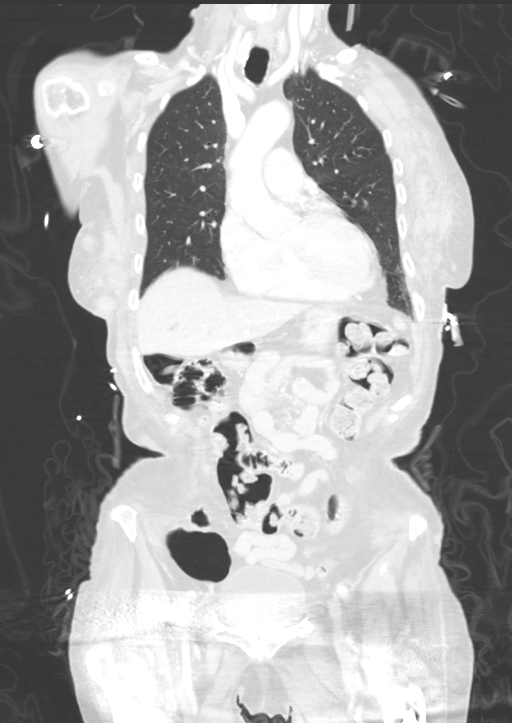
[im 77/129  lung]
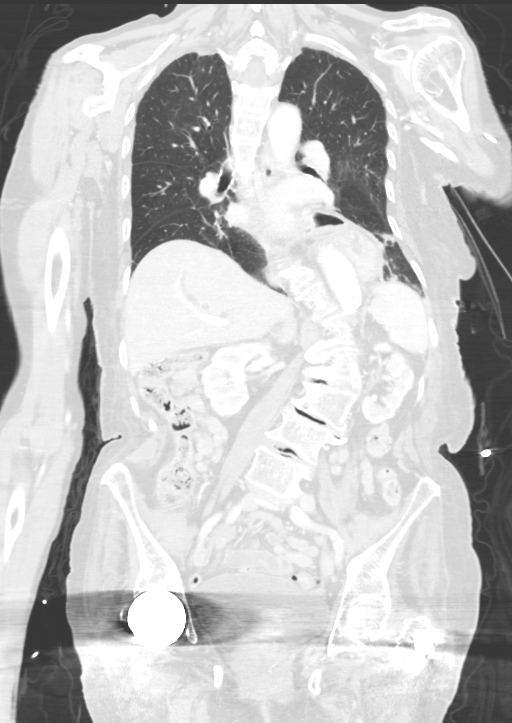

[Series 6: lung · axial · 0.73mm/px · z∈[-178,-112]mm · 3 of 141 slices shown]
[im 11/141  lung]
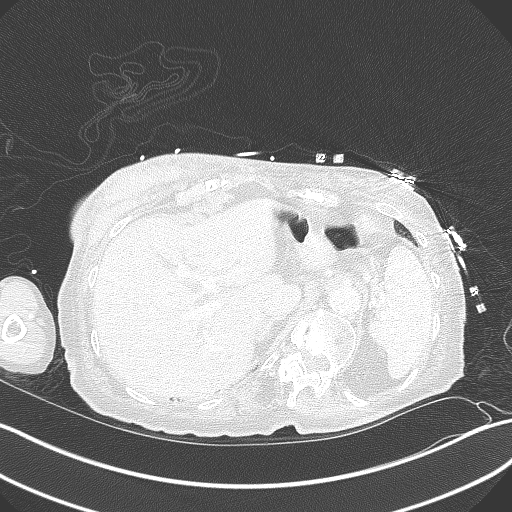
[im 33/141  lung]
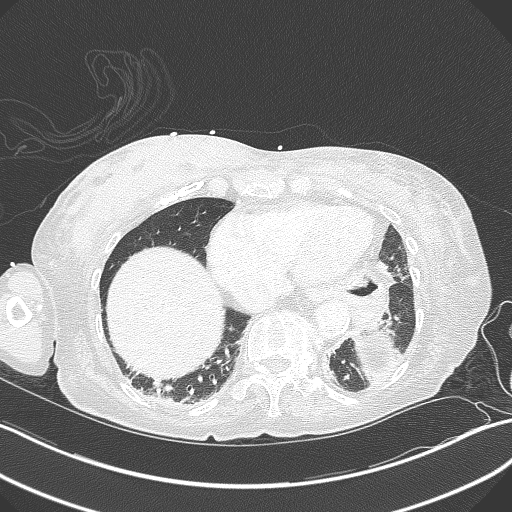
[im 44/141  lung]
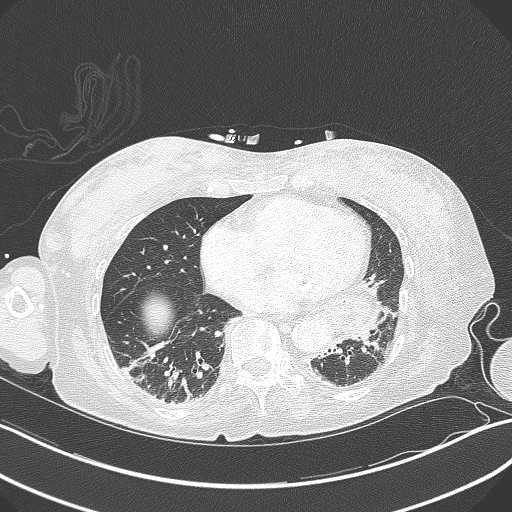

[14 of 36 positions shown; findings below may reference images not displayed]

FINDINGS: CT CHEST FINDINGS

Cardiovascular: The heart is normal in size. No pericardial
effusion. There is mild tortuosity, ectasia and calcification of the
thoracic aorta but no aneurysm or dissection. Three-vessel coronary
artery calcifications are noted.

Mediastinum/Nodes: No mediastinal or hilar mass or adenopathy or
hematoma. The esophagus is grossly normal. Small scattered thyroid
nodules are stable.

Lungs/Pleura: No acute pulmonary findings. No pulmonary contusion,
pneumothorax or pleural effusion. There is streaky bibasilar
atelectasis and focal eventration of the right hemidiaphragm. No
worrisome pulmonary lesions. The central tracheobronchial tree is
unremarkable.

Musculoskeletal: There are fourth, fifth and right sixth lateral rib
fractures. These are minimally displaced. No definite left-sided rib
fractures. The sternum is intact.

Scoliosis and degenerative changes involving the thoracic spine but
no acute fracture.

Extensive soft tissue density involving both breasts. This is
unchanged since the prior CT scan and I suspected some type of
remote silicone injections.

CT ABDOMEN PELVIS FINDINGS

Hepatobiliary: No focal hepatic lesions or evidence of acute hepatic
injury. No perihepatic fluid collections. Gallbladder is grossly
normal. There is intra and extrahepatic biliary dilatation. This is
new since the CT scan from August 2019. The common bile duct
measures a maximum of 13.5 mm in the head of the pancreas. I do not
see an obvious common bile duct stone, ampullary region or
pancreatic head mass to account for the dilatation. There could be a
distal stricture. Recommend correlation with liver function studies.
Patient probably not a good candidate for MRCP given limited breath
holding for the CT scan.

Pancreas: No mass, inflammation or ductal dilatation. Moderate
pancreatic atrophy.

Spleen: Normal size. No focal lesions.

Adrenals/Urinary Tract: The adrenal glands are unremarkable.

The left kidney is small and demonstrates renal cortical thinning.
No renal lesions or hydronephrosis. The bladder is unremarkable.

Stomach/Bowel: The stomach, duodenum, small bowel and colon are
grossly normal without oral contrast. No acute inflammatory changes,
mass lesions or obstructive findings.

Vascular/Lymphatic: Advanced atherosclerotic calcifications
involving the tortuous abdominal aorta but no aneurysm. The branch
vessels are patent. The major venous structures are patent. No
mesenteric or retroperitoneal mass or adenopathy.

Reproductive: Surgically absent.

Other: No pelvic mass or free pelvic fluid collections. Moderate
artifact through the lower pelvis due to bilateral hip hardware.

Musculoskeletal: Severe scoliosis and associated degenerative
changes. No acute bony findings or destructive bony changes.
IMPRESSION: 1. Right fourth, fifth and sixth lateral rib fractures. No
pneumothorax or pulmonary contusion.
2. No acute abdominal/pelvic findings, mass lesions or adenopathy.
3. Intra and extrahepatic biliary dilatation, new since CT scan of
August 2019. No obvious obstructing common bile duct stone,
ampullary lesion or pancreatic head mass. Recommend correlation with
liver function studies. Patient probably not a good candidate for
MRCP given limited breath holding for the CT scan.
4. Advanced atherosclerotic calcifications involving the thoracic
and abdominal aorta and branch vessels including the coronary
arteries.

Aortic Atherosclerosis ([id number]).

## 2020-11-05 IMAGING — CT CT CERVICAL SPINE W/O CM
3 of 4 series · 12 of 33 positions shown, 14 images · non-contrast
Comparison: February 05, 2020.

CLINICAL DATA: Unwitnessed fall. No loss of consciousness.

EXAM:
CT HEAD WITHOUT CONTRAST
CT CERVICAL SPINE WITHOUT CONTRAST
TECHNIQUE: Multidetector CT imaging of the head and cervical spine was
performed following the standard protocol without intravenous
contrast. Multiplanar CT image reconstructions of the cervical spine
were also generated.

[Series 7: sagittal bone · sagittal · 0.29mm/px · 5 of 61 slices shown, 6 images]
[im 21/61  bone]
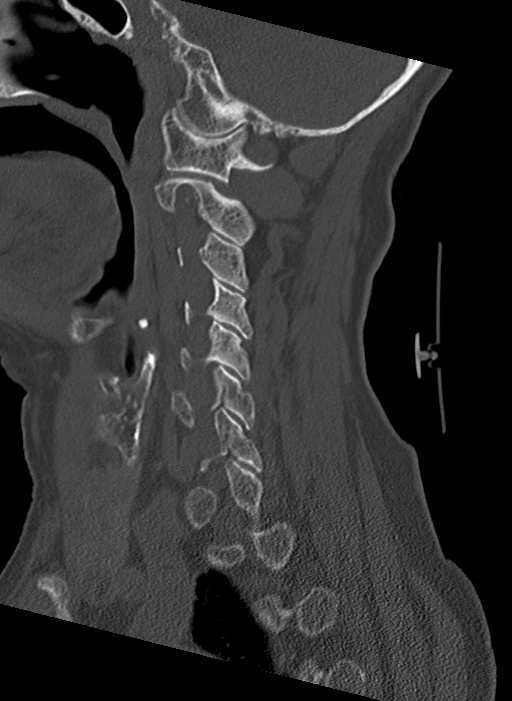
[im 26/61  bone]
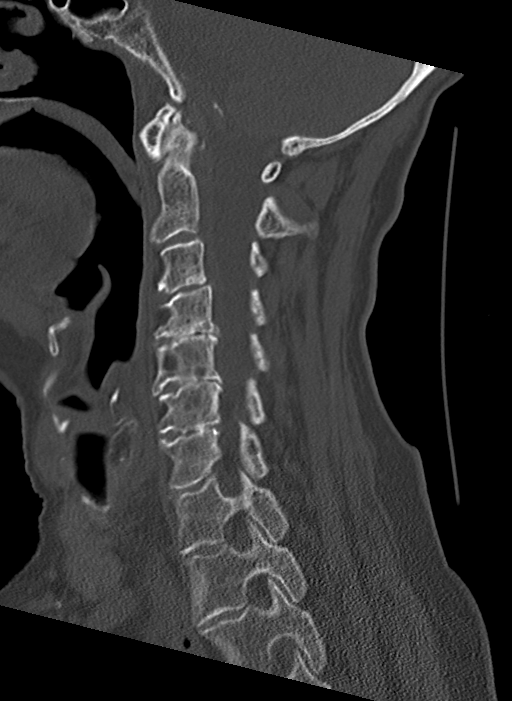
[im 31/61  soft-tissue]
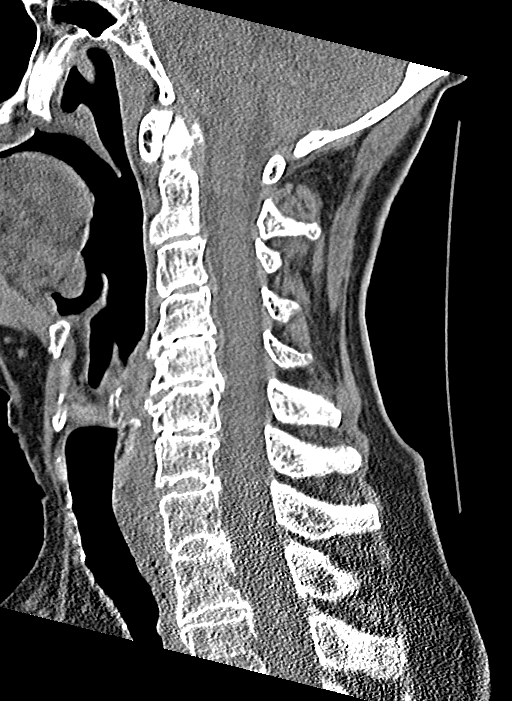
[im 31/61  bone]
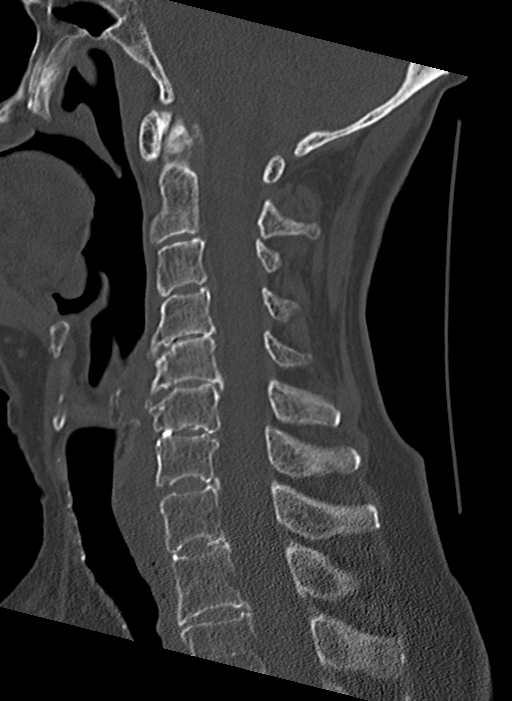
[im 36/61  bone]
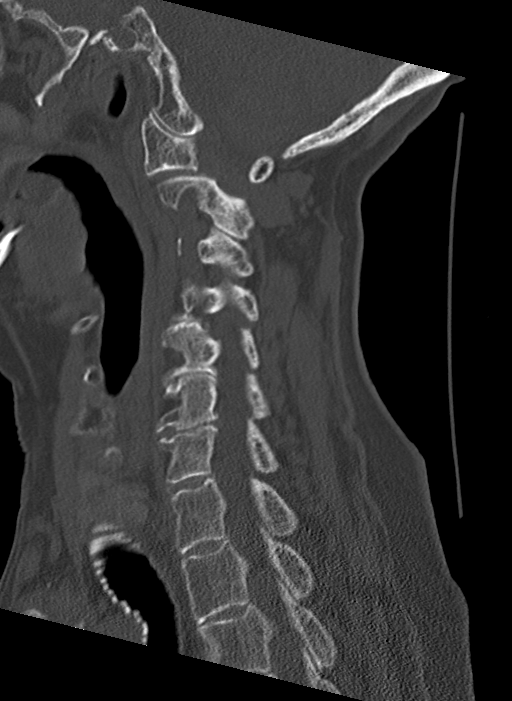
[im 41/61  bone]
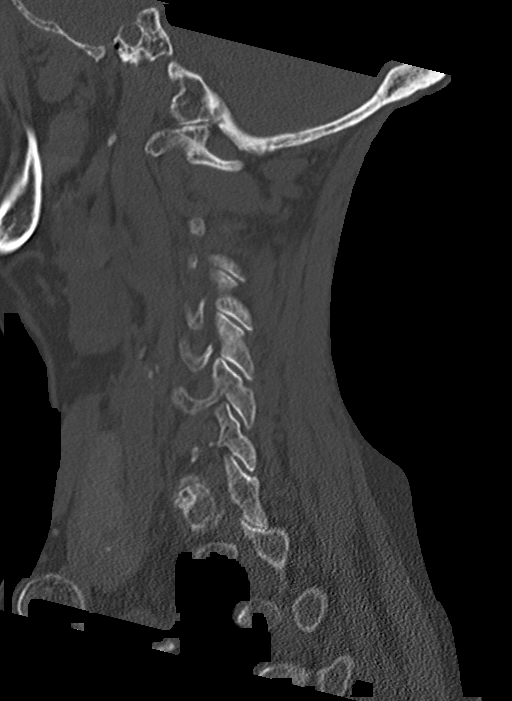

[Series 8: coronal bone · coronal · 0.27mm/px · 3 of 61 slices shown]
[im 13/61  bone]
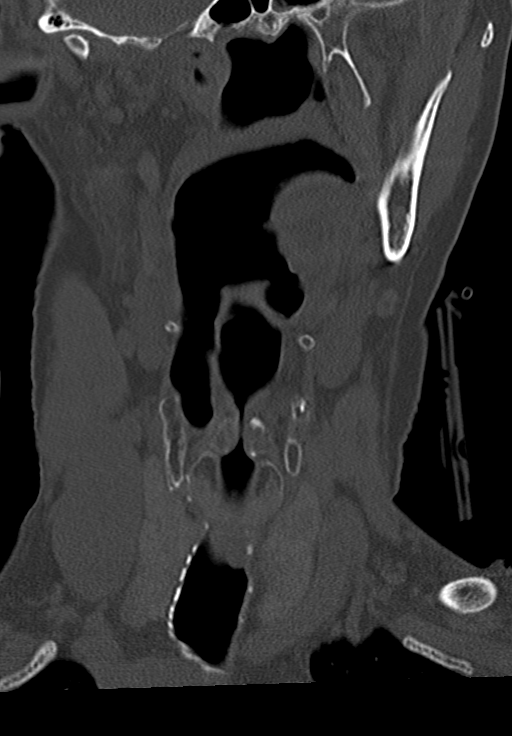
[im 25/61  bone]
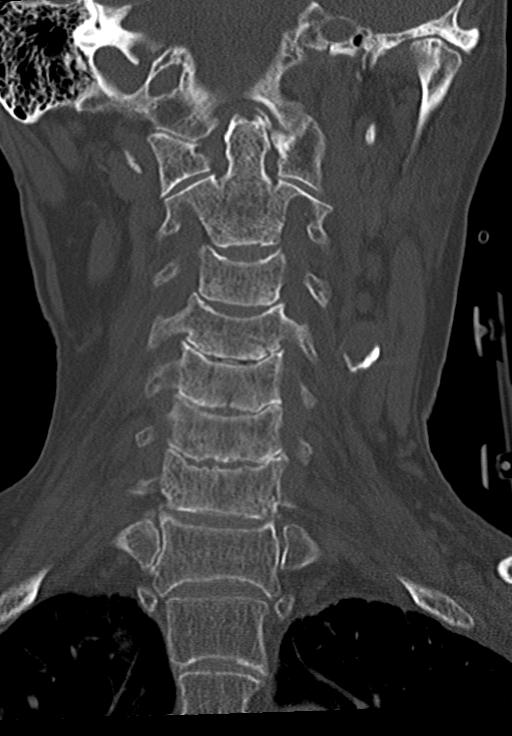
[im 37/61  bone]
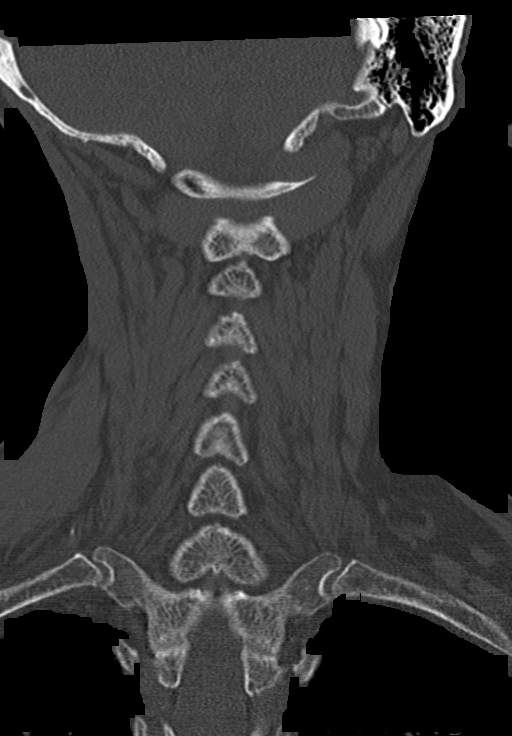

[Series 9: orthogonal bone · axial · 0.23mm/px · z∈[-313,-189]mm · 4 of 96 slices shown, 5 images]
[im 16/96  soft-tissue]
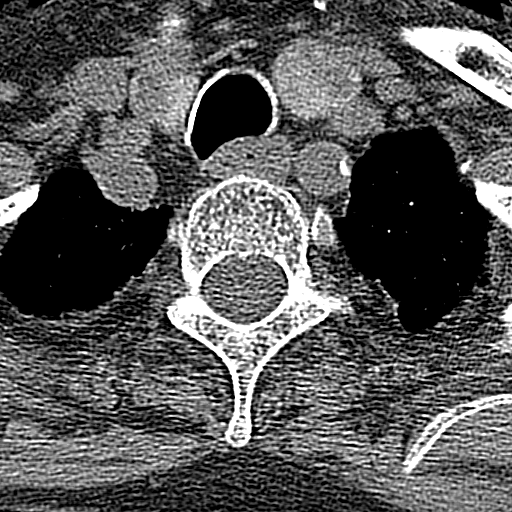
[im 16/96  bone]
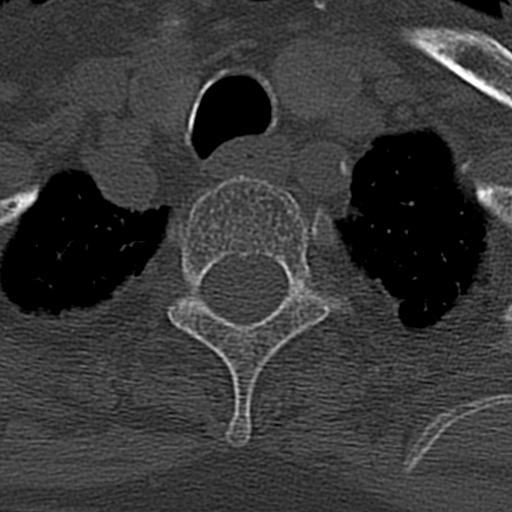
[im 32/96  bone]
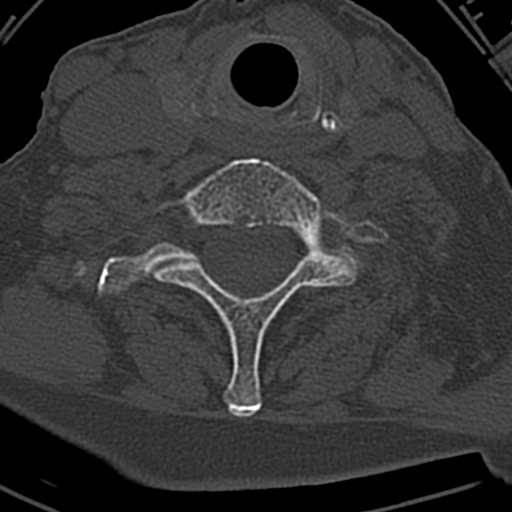
[im 64/96  bone]
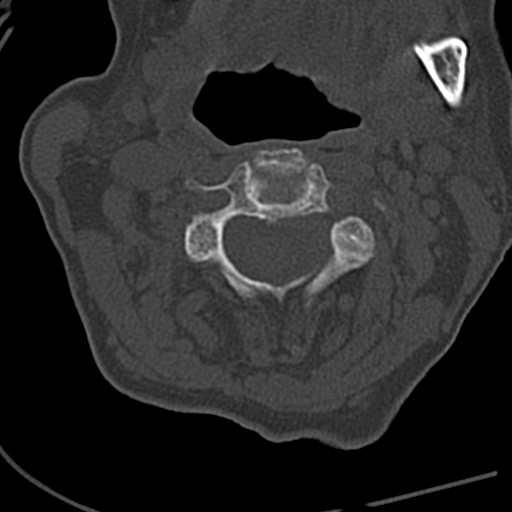
[im 80/96  bone]
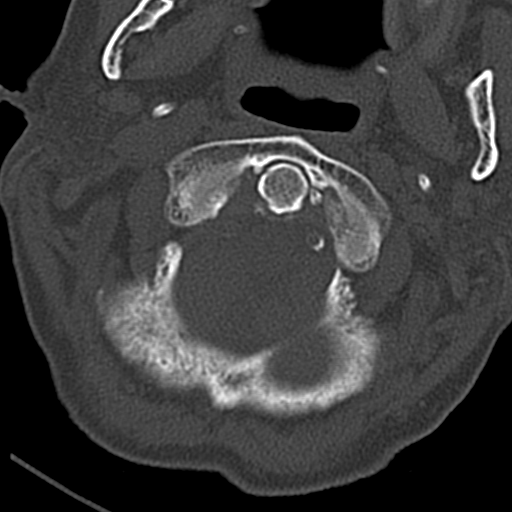

[12 of 33 positions shown; findings below may reference images not displayed]

FINDINGS: CT HEAD FINDINGS

Brain: Mild diffuse cortical atrophy is noted. Mild chronic ischemic
white matter disease is noted. Old right cerebellar infarction is
noted. No mass effect or midline shift is noted. Ventricular size is
within normal limits. There is no evidence of mass lesion,
hemorrhage or acute infarction.

Vascular: No hyperdense vessel or unexpected calcification.

Skull: Normal. Negative for fracture or focal lesion.

Sinuses/Orbits: No acute finding.

Other: None.

CT CERVICAL SPINE FINDINGS

Alignment: Normal.

Skull base and vertebrae: No acute fracture. No primary bone lesion
or focal pathologic process.

Soft tissues and spinal canal: No prevertebral fluid or swelling. No
visible canal hematoma.

Disc levels: Severe degenerative disc disease is noted at C4-5, C5-6
and C6-7 with anterior osteophyte formation.

Upper chest: Negative.

Other: None.
IMPRESSION: 1. Mild diffuse cortical atrophy. Mild chronic ischemic white matter
disease. No acute intracranial abnormality seen.
2. Severe multilevel degenerative disc disease. No acute abnormality
seen in the cervical spine.

## 2020-11-06 IMAGING — CR DG CHEST 2V
2 series · 2 of 2 positions shown · non-contrast
Comparison: Chest radiograph from one day prior.

CLINICAL DATA: Recent fall with right fourth through sixth rib
fractures.

EXAM:
CHEST - 2 VIEW

[chest lat]
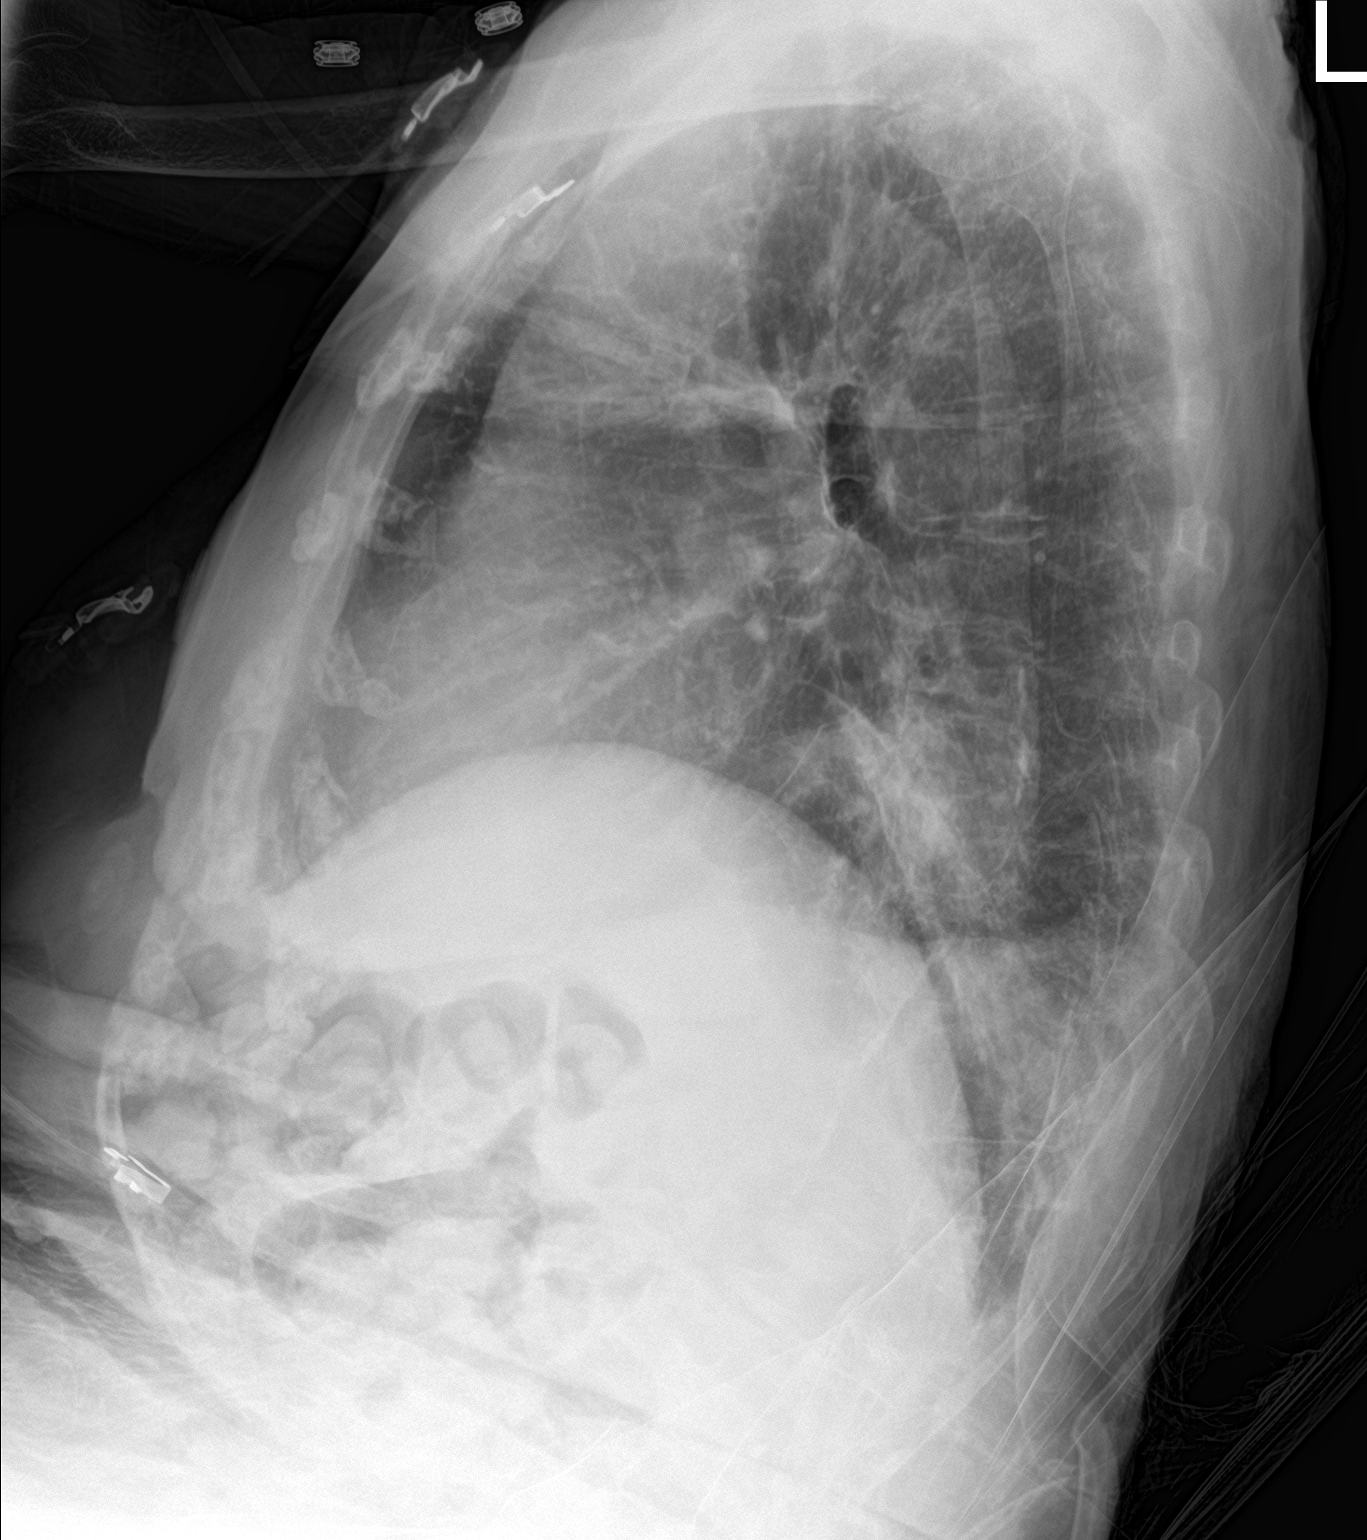

[chest ap]
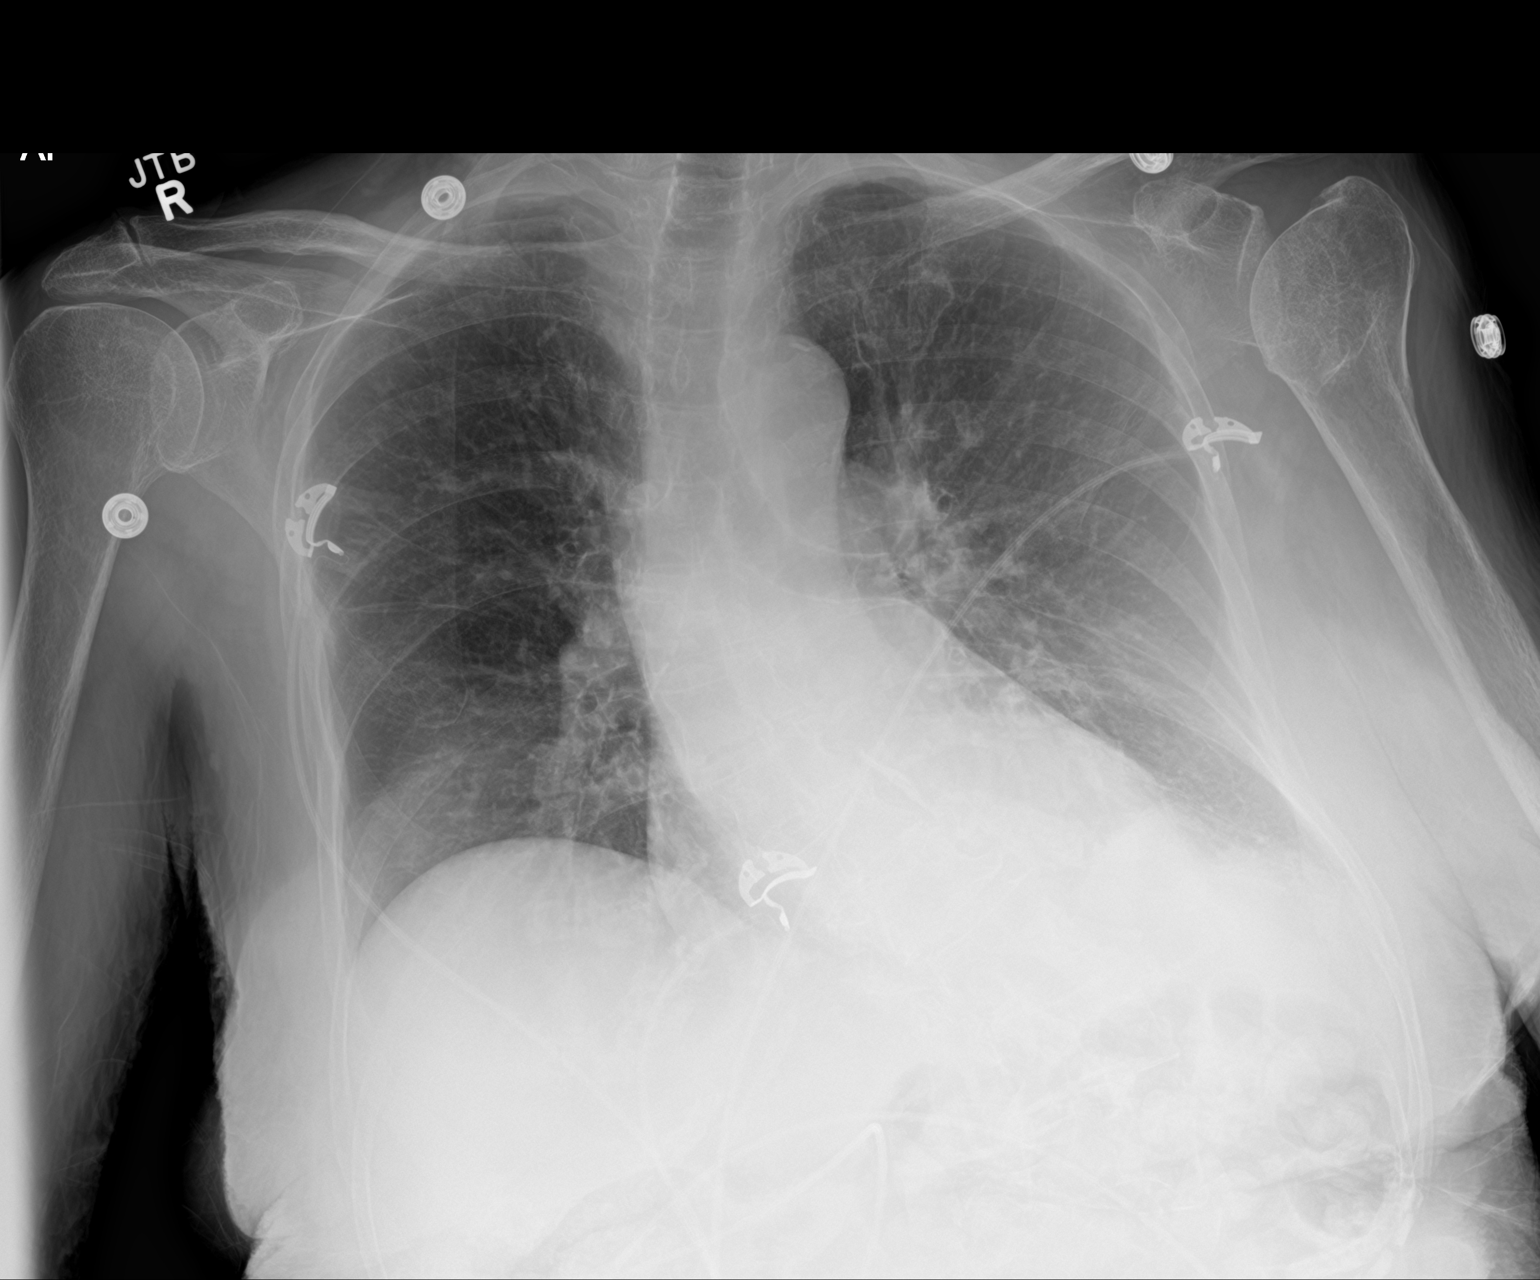

[2 of 2 positions shown; findings below may reference images not displayed]

FINDINGS: Stable cardiomediastinal silhouette with top-normal heart size. No
pneumothorax. No pleural effusion. No pulmonary edema. Mild left
basilar atelectasis is unchanged. Redemonstration lateral right mid
rib fractures.
IMPRESSION: 1. No pneumothorax.
2. Stable mild left basilar atelectasis.

## 2020-11-07 ENCOUNTER — Encounter (HOSPITAL_COMMUNITY): Payer: Self-pay | Admitting: Emergency Medicine

## 2020-11-07 ENCOUNTER — Emergency Department (HOSPITAL_COMMUNITY): Payer: Medicare Other

## 2020-11-07 ENCOUNTER — Emergency Department (HOSPITAL_COMMUNITY)
Admission: EM | Admit: 2020-11-07 | Discharge: 2020-11-07 | Disposition: A | Discharge status: Home / Self Care | Payer: Medicare Other | Attending: Emergency Medicine | Admitting: Emergency Medicine

## 2020-11-07 ENCOUNTER — Other Ambulatory Visit: Payer: Self-pay

## 2020-11-07 DIAGNOSIS — Z7982 Long term (current) use of aspirin: Secondary | ICD-10-CM | POA: Diagnosis not present

## 2020-11-07 DIAGNOSIS — M25511 Pain in right shoulder: Secondary | ICD-10-CM | POA: Diagnosis not present

## 2020-11-07 DIAGNOSIS — M25531 Pain in right wrist: Secondary | ICD-10-CM | POA: Insufficient documentation

## 2020-11-07 DIAGNOSIS — Z87891 Personal history of nicotine dependence: Secondary | ICD-10-CM | POA: Diagnosis not present

## 2020-11-07 DIAGNOSIS — I10 Essential (primary) hypertension: Secondary | ICD-10-CM | POA: Insufficient documentation

## 2020-11-07 DIAGNOSIS — Z96641 Presence of right artificial hip joint: Secondary | ICD-10-CM | POA: Insufficient documentation

## 2020-11-07 DIAGNOSIS — Z79899 Other long term (current) drug therapy: Secondary | ICD-10-CM | POA: Insufficient documentation

## 2020-11-07 DIAGNOSIS — M25521 Pain in right elbow: Secondary | ICD-10-CM | POA: Insufficient documentation

## 2020-11-07 DIAGNOSIS — Z7901 Long term (current) use of anticoagulants: Secondary | ICD-10-CM | POA: Insufficient documentation

## 2020-11-07 DIAGNOSIS — M25519 Pain in unspecified shoulder: Secondary | ICD-10-CM

## 2020-11-07 MED ORDER — PREDNISONE 50 MG PO TABS
60.0000 mg | ORAL_TABLET | Freq: Once | ORAL | Status: AC
  Administered 2020-11-07: 60 mg via ORAL
  Filled 2020-11-07: qty 1

## 2020-11-07 MED ORDER — PREDNISONE 10 MG PO TABS: 20.0000 mg | ORAL_TABLET | Freq: Every day | ORAL | 0 refills | Status: DC

## 2020-11-07 MED ORDER — HYDROCODONE-ACETAMINOPHEN 5-325 MG PO TABS
1.0000 | ORAL_TABLET | Freq: Once | ORAL | Status: AC
  Administered 2020-11-07: 1 via ORAL
  Filled 2020-11-07: qty 1

## 2020-11-07 MED ORDER — HYDROCODONE-ACETAMINOPHEN 5-325 MG PO TABS: 1.0000 | ORAL_TABLET | Freq: Four times a day (QID) | ORAL | 0 refills | Status: DC | PRN

## 2020-11-07 NOTE — ED Triage Notes (Signed)
Pt from [facility], states elbow pain for several days now. Facility has had 2 xrays completed that showed no fracture.

## 2020-11-07 NOTE — ED Notes (Addendum)
Attempted report x3. Facility answering phone and then transferring me to a phone that just keeps ringing and then hangs up on me.

## 2020-11-07 NOTE — ED Notes (Signed)
Pt being transported to x-ray

## 2020-11-07 NOTE — Discharge Instructions (Addendum)
Follow-up with your family doctor next week °

## 2020-11-07 NOTE — ED Notes (Signed)
Pt returned from X-ray.  

## 2020-11-07 NOTE — ED Provider Notes (Signed)
[facility] EMERGENCY DEPARTMENT Provider Note   CSN: [id number] Arrival date & time: 11/07/20  1923     History Chief Complaint  Patient presents with  . Elbow Pain    Angelica Bell is a 86 y.o. female.  Patient complains of pain in her right wrist along with her elbow and shoulder.  Basically from nursing home  The history is provided by the patient and medical records. No language interpreter was used.  Wrist Pain This is a new problem. The current episode started 12 to 24 hours ago. The problem occurs constantly. The problem has not changed since onset.Pertinent negatives include no chest pain, no abdominal pain and no headaches. Nothing aggravates the symptoms. Nothing relieves the symptoms.       Past Medical History:  Diagnosis Date  . Chronic back pain    with degenerative joint disease  . Chronic pain disorder    with narcotic management  . Dry eyes   . Frequent falls   . HOH (hard of hearing)   . Hypertension   . Multiple rib fractures 02/2020  . Osteoporosis   . PE (pulmonary embolism)    After hysterectomy in 1967  . Pneumonia   . Rhabdomyolysis   . Stroke [facility])     Patient Active Problem List   Diagnosis Date Noted  . AMS (altered mental status) 10/12/2020  . Acute ischemic stroke ([place]) 10/11/2020  . Hyperglycemia 10/11/2020  . Failure to thrive in adult 10/11/2020  . Palliative care by specialist   . Goals of care, counseling/discussion   . DNR (do not resuscitate) discussion   . Acute CVA (cerebrovascular accident) ([place]) 08/31/2020  . Acute metabolic encephalopathy 37/03/8888  . Acute respiratory failure with hypoxia ([place]) 05/17/2020  . Opioid overdose ([place]) 05/13/2020  . DNR (do not resuscitate) 03/09/2020  . Frequent falls 03/09/2020  . Multiple rib fractures 03/08/2020  . Common bile duct dilatation 03/08/2020  . Leukocytosis 03/08/2020  . Normocytic anemia 03/08/2020  . Hypomagnesemia 02/08/2020  . Fall 02/06/2020  . AKI (acute  kidney injury) ([place]) 02/05/2020  . UTI (urinary tract infection) 09/01/2019  . Rhabdomyolysis 09/01/2019  . Shock ([place]) 08/31/2019  . Acute cystitis 08/03/2019  . Confusion and disorientation 08/03/2019  . Cerebrovascular accident (CVA) ([place])   . ARF (acute renal failure) ([place]) 07/26/2019  . Hyponatremia 07/26/2019  . Abnormal liver function 07/26/2019  . Peripheral neuropathy   . Drug overdose   . Polypharmacy   . Altered mental status 10/25/2017  . Closed displaced intertrochanteric fracture of left femur ([place]) 02/16/2017  . Hip fracture, right ([place]) 12/25/2015  . Chronic pain 12/24/2015  . Fracture of femoral neck, right, closed ([place]) 12/24/2015  . Essential hypertension 12/24/2015  . Community acquired pneumonia 04/15/2013  . Hypokalemia 04/15/2013  . Labial cyst 02/08/2012  . POSTMENOPAUSAL SYNDROME 11/28/2009  . Mononeuritis of lower limb 02/22/2009  . INSOMNIA-SLEEP DISORDER-UNSPEC 10/12/2008  . Lumbago 08/02/2008  . 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 ANXIETY DEPRESSION 12/16/2007  . Hyperlipidemia 07/08/2007  . Osteoarthritis 07/08/2007  . Osteoporosis 07/08/2007  . SCOLIOSIS NEC 07/08/2007    Past Surgical History:  Procedure Laterality Date  . ABDOMINAL HYSTERECTOMY    . APPENDECTOMY    . BREAST ENHANCEMENT SURGERY    . CATARACT EXTRACTION    . FEMUR IM NAIL Left 02/17/2017   Procedure: INTRAMEDULLARY (IM) NAIL FEMORAL;  Surgeon: Nicholes Stairs, MD;  Location: [place];  Service: Orthopedics;  Laterality: Left;  . HIP ARTHROPLASTY Right  12/24/2015   Procedure: ARTHROPLASTY BIPOLAR HIP (HEMIARTHROPLASTY);  Surgeon: Netta Cedars, MD;  Location: [facility];  Service: Orthopedics;  Laterality: Right;  . TONSILLECTOMY       OB History   No obstetric history on file.     Family History  Problem Relation Age of Onset  . Heart Problems Mother     Social History   Tobacco Use  . Smoking status: Former Smoker    Years: 13.00    Quit date:  04/15/1976    Years since quitting: 44.5  . Smokeless tobacco: Never Used  Vaping Use  . Vaping Use: Never used  Substance Use Topics  . Alcohol use: No  . Drug use: No    Home Medications Prior to Admission medications   Medication Sig Start Date End Date Taking? Authorizing Provider  acetaminophen (TYLENOL) 500 MG tablet Take 500 mg by mouth every 6 (six) hours as needed for moderate pain.     [provider]  amLODipine (NORVASC) 2.5 MG tablet Take 1 tablet (2.5 mg total) by mouth daily. 05/18/20   Charlynne Cousins, MD  aspirin EC 81 MG EC tablet Take 1 tablet (81 mg total) by mouth daily. 09/15/19   Matcha, Beverely Pace, MD  atorvastatin (LIPITOR) 40 MG tablet Take 1 tablet (40 mg total) by mouth daily. 09/05/20   Shelly Coss, MD  buPROPion (WELLBUTRIN) 75 MG tablet Take 75 mg by mouth daily.    [provider]  cholecalciferol (VITAMIN D) 25 MCG tablet Take 1 tablet (1,000 Units total) by mouth daily. 09/05/20   Shelly Coss, MD  clopidogrel (PLAVIX) 75 MG tablet Take 1 tablet (75 mg total) by mouth daily. Stop after 25 days 09/06/20   Shelly Coss, MD  donepezil (ARICEPT) 5 MG tablet Take 5 mg by mouth daily. 06/28/20   [provider]  HYDROcodone-acetaminophen (NORCO/VICODIN) 5-325 MG tablet Take 1 tablet by mouth every 6 (six) hours as needed. 11/07/20   Milton Ferguson, MD  mirtazapine (REMERON) 7.5 MG tablet Take 1 tablet (7.5 mg total) by mouth at bedtime. 10/13/20   Barton Dubois, MD  Multiple Vitamin (MULTIVITAMIN WITH MINERALS) TABS tablet Take 1 tablet by mouth daily. 03/11/20   Black, Lezlie Octave, NP  Nutritional Supplements (RESOURCE 2.0) LIQD Take 60 mLs by mouth in the morning, at noon, and at bedtime.    [provider]  predniSONE (DELTASONE) 10 MG tablet Take 2 tablets (20 mg total) by mouth daily. 11/07/20   Milton Ferguson, MD    Allergies    Hctz [hydrochlorothiazide], Amoxicillin-pot clavulanate, Lisinopril-hydrochlorothiazide, Sulfa  antibiotics, and Sulfonamide derivatives  Review of Systems   Review of Systems  Constitutional: Negative for appetite change and fatigue.  HENT: Negative for congestion, ear discharge and sinus pressure.   Eyes: Negative for discharge.  Respiratory: Negative for cough.   Cardiovascular: Negative for chest pain.  Gastrointestinal: Negative for abdominal pain and diarrhea.  Genitourinary: Negative for frequency and hematuria.  Musculoskeletal: Negative for back pain.       Right wrist and right elbow with shoulder pain  Skin: Negative for rash.  Neurological: Negative for seizures and headaches.  Psychiatric/Behavioral: Negative for hallucinations.    Physical Exam Updated Vital Signs BP (!) 141/86   Pulse 94   Temp 97.7 F (36.5 C) (Oral)   Resp (!) 23   Ht 5\' 1"  (1.549 m)   SpO2 93%   BMI 18.33 kg/m   Physical Exam Vitals and nursing note reviewed.  Constitutional:  Appearance: She is well-developed.  HENT:     Head: Normocephalic.     Nose: Nose normal.  Eyes:     General: No scleral icterus.    Conjunctiva/sclera: Conjunctivae normal.  Neck:     Thyroid: No thyromegaly.  Cardiovascular:     Rate and Rhythm: Normal rate and regular rhythm.     Heart sounds: No murmur heard.  No friction rub. No gallop.   Pulmonary:     Breath sounds: No stridor. No wheezing or rales.  Chest:     Chest wall: No tenderness.  Abdominal:     General: There is no distension.     Tenderness: There is no abdominal tenderness. There is no rebound.  Musculoskeletal:     Cervical back: Neck supple.     Comments: Swelling to right wrist and tenderness to right wrist elbow and shoulder  Lymphadenopathy:     Cervical: No cervical adenopathy.  Skin:    Findings: No erythema or rash.  Neurological:     Mental Status: She is alert and oriented to person, place, and time.     Motor: No abnormal muscle tone.     Coordination: Coordination normal.  Psychiatric:        Behavior:  Behavior normal.     ED Results / Procedures / Treatments   Labs (all labs ordered are listed, but only abnormal results are displayed) Labs Reviewed - No data to display  EKG None  Radiology DG Shoulder Right  Result Date: 11/07/2020 CLINICAL DATA:  Right shoulder pain without known injury. EXAM: RIGHT SHOULDER - 2+ VIEW COMPARISON:  None. FINDINGS: There is no evidence of fracture or dislocation. There is no evidence of arthropathy or other focal bone abnormality. Soft tissues are unremarkable. IMPRESSION: Negative. Electronically Signed   By: Marijo Conception M.D.   On: 11/07/2020 20:17   DG Elbow Complete Right  Result Date: 11/07/2020 CLINICAL DATA:  Right elbow pain without known injury. EXAM: RIGHT ELBOW - COMPLETE 3+ VIEW COMPARISON:  None. FINDINGS: There is no evidence of fracture, dislocation, or joint effusion. There is no evidence of arthropathy or other focal bone abnormality. Soft tissues are unremarkable. IMPRESSION: Negative. Electronically Signed   By: Marijo Conception M.D.   On: 11/07/2020 20:16   DG Wrist Complete Right  Result Date: 11/07/2020 CLINICAL DATA:  Right wrist pain, limited mobility EXAM: RIGHT WRIST - COMPLETE 3+ VIEW COMPARISON:  None. FINDINGS: Four view radiograph right wrist demonstrates marked osteopenia of the visualized osseous structures. No definite fracture or dislocation identified. Possible coalition of the capitate and hamate, versus is poor profiling of the joint space. Remaining joint spaces appear preserved. There is mild soft tissue swelling seen along the volar aspect of the carpus. IMPRESSION: Soft tissue swelling. Marked osteopenia. No definite fracture or dislocation. Electronically Signed   By: Fidela Salisbury MD   On: 11/07/2020 20:54    Procedures Procedures (including critical care time)  Medications Ordered in ED Medications  predniSONE (DELTASONE) tablet 60 mg (has no administration in time range)  HYDROcodone-acetaminophen  (NORCO/VICODIN) 5-325 MG per tablet 1 tablet (has no administration in time range)    ED Course  I have reviewed the triage vital signs and the nursing notes.  Pertinent labs & imaging results that were available during my care of the patient were reviewed by me and considered in my medical decision making (see chart for details).    MDM Rules/Calculators/A&P  Patient with inflammation in her right wrist possible gout.  Patient is placed on prednisone and pain medicine      This patient presents to the ED for concern of wrist pain, this involves an extensive number of treatment options, and is a complaint that carries with it a high risk of complications and morbidity.  The differential diagnosis includes arthritis   Lab Tests:     Medicines ordered:   I ordered medication prednisone and narcotic for pain  Imaging Studies ordered:   I ordered imaging studies which included right wrist elbow and shoulder   I independently visualized and interpreted imaging which showed no fracture  Additional history obtained:   Additional history obtained from records  Previous records obtained and reviewed.  Consultations Obtained:     Reevaluation:  After the interventions stated above, I reevaluated the patient and found mild improvement  Critical Interventions:  .   Final Clinical Impression(s) / ED Diagnoses Final diagnoses:  Wrist pain, right    Rx / DC Orders ED Discharge Orders         Ordered    predniSONE (DELTASONE) 10 MG tablet  Daily        11/07/20 2119    HYDROcodone-acetaminophen (NORCO/VICODIN) 5-325 MG tablet  Every 6 hours PRN        11/07/20 2119           Milton Ferguson, MD 11/08/20 1246

## 2020-11-27 IMAGING — DX DG CHEST 2V
2 series · 2 of 2 positions shown · non-contrast
Comparison: 03/09/2020

CLINICAL DATA: Rib fractures, fell [REDACTED]

EXAM:
CHEST - 2 VIEW

[chest lat]
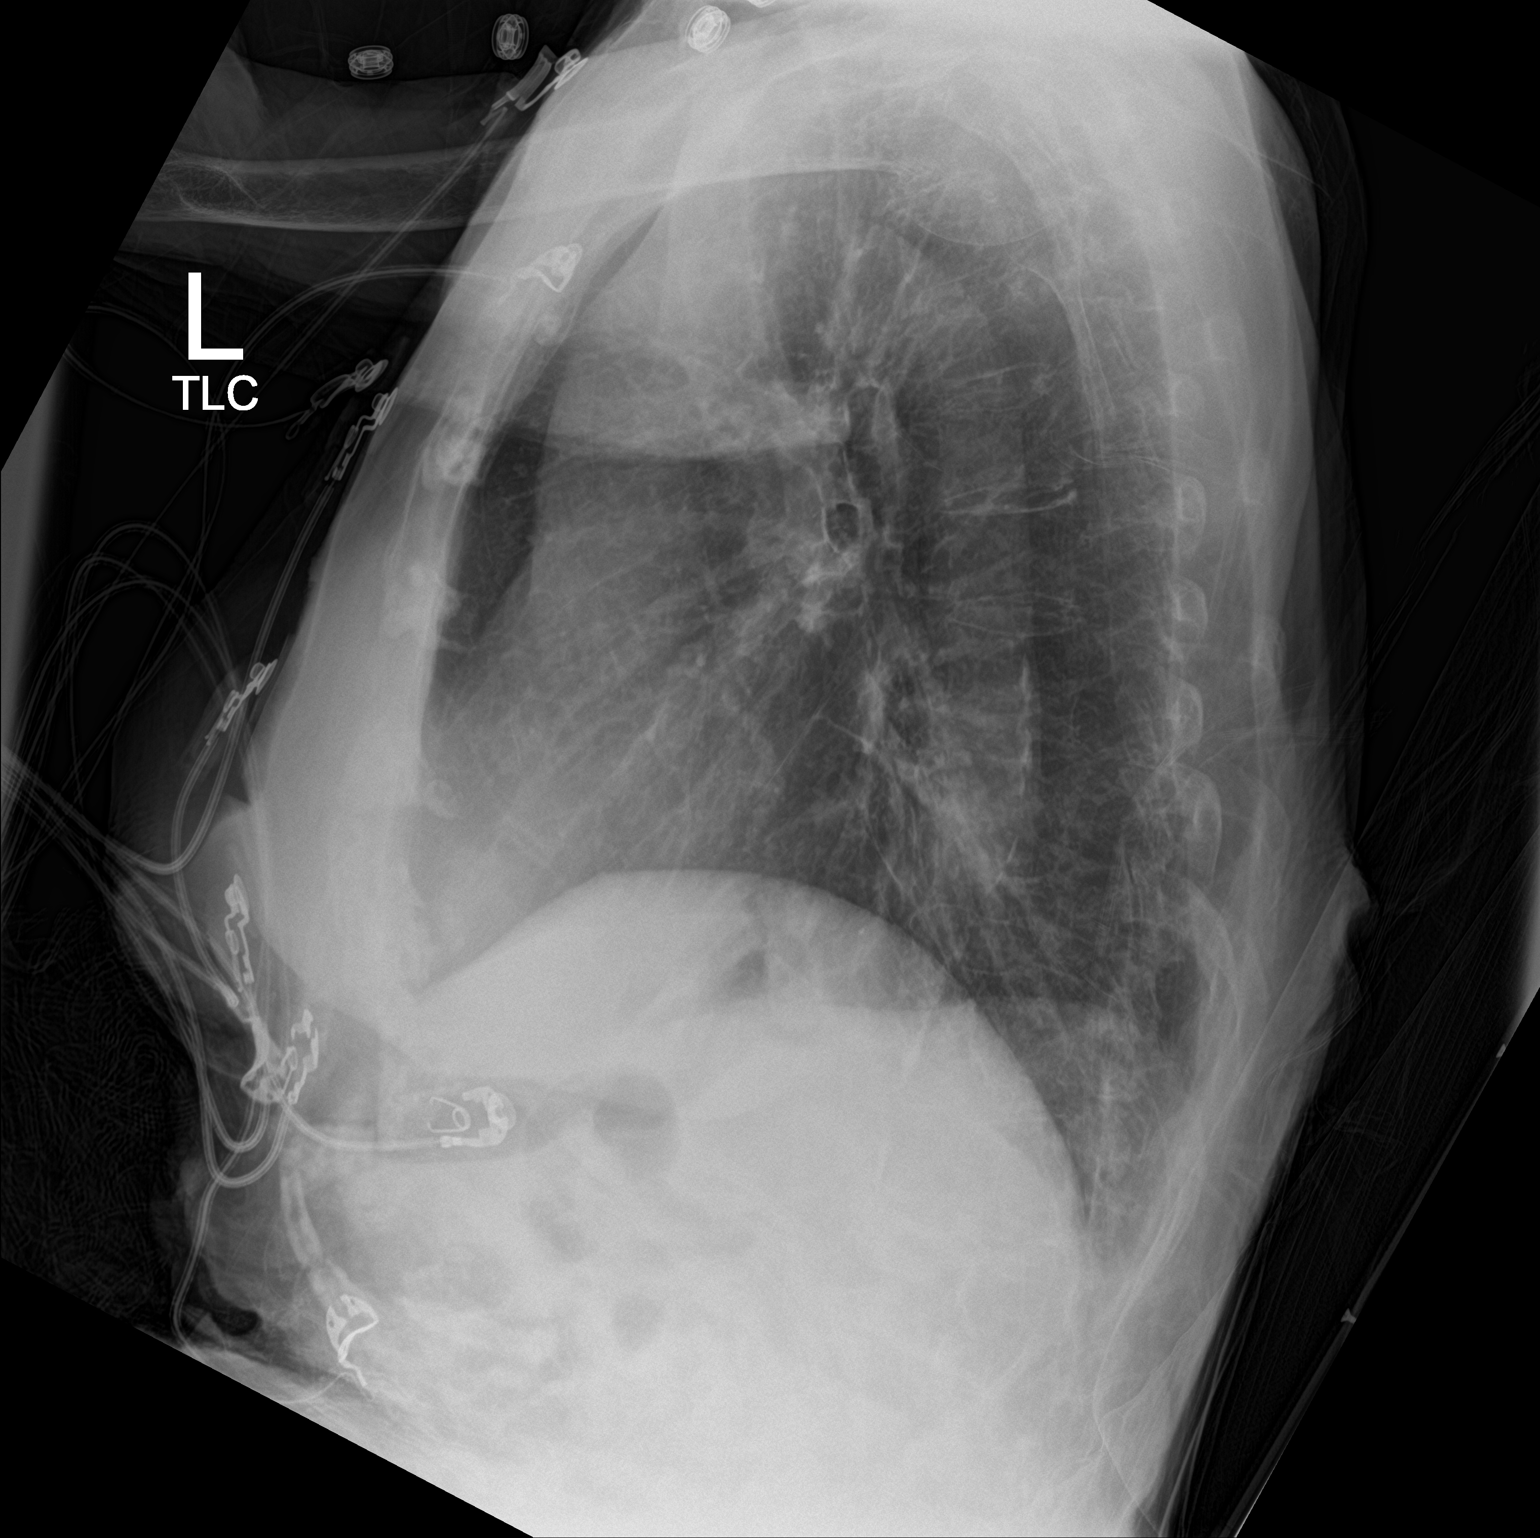

[chest ap]
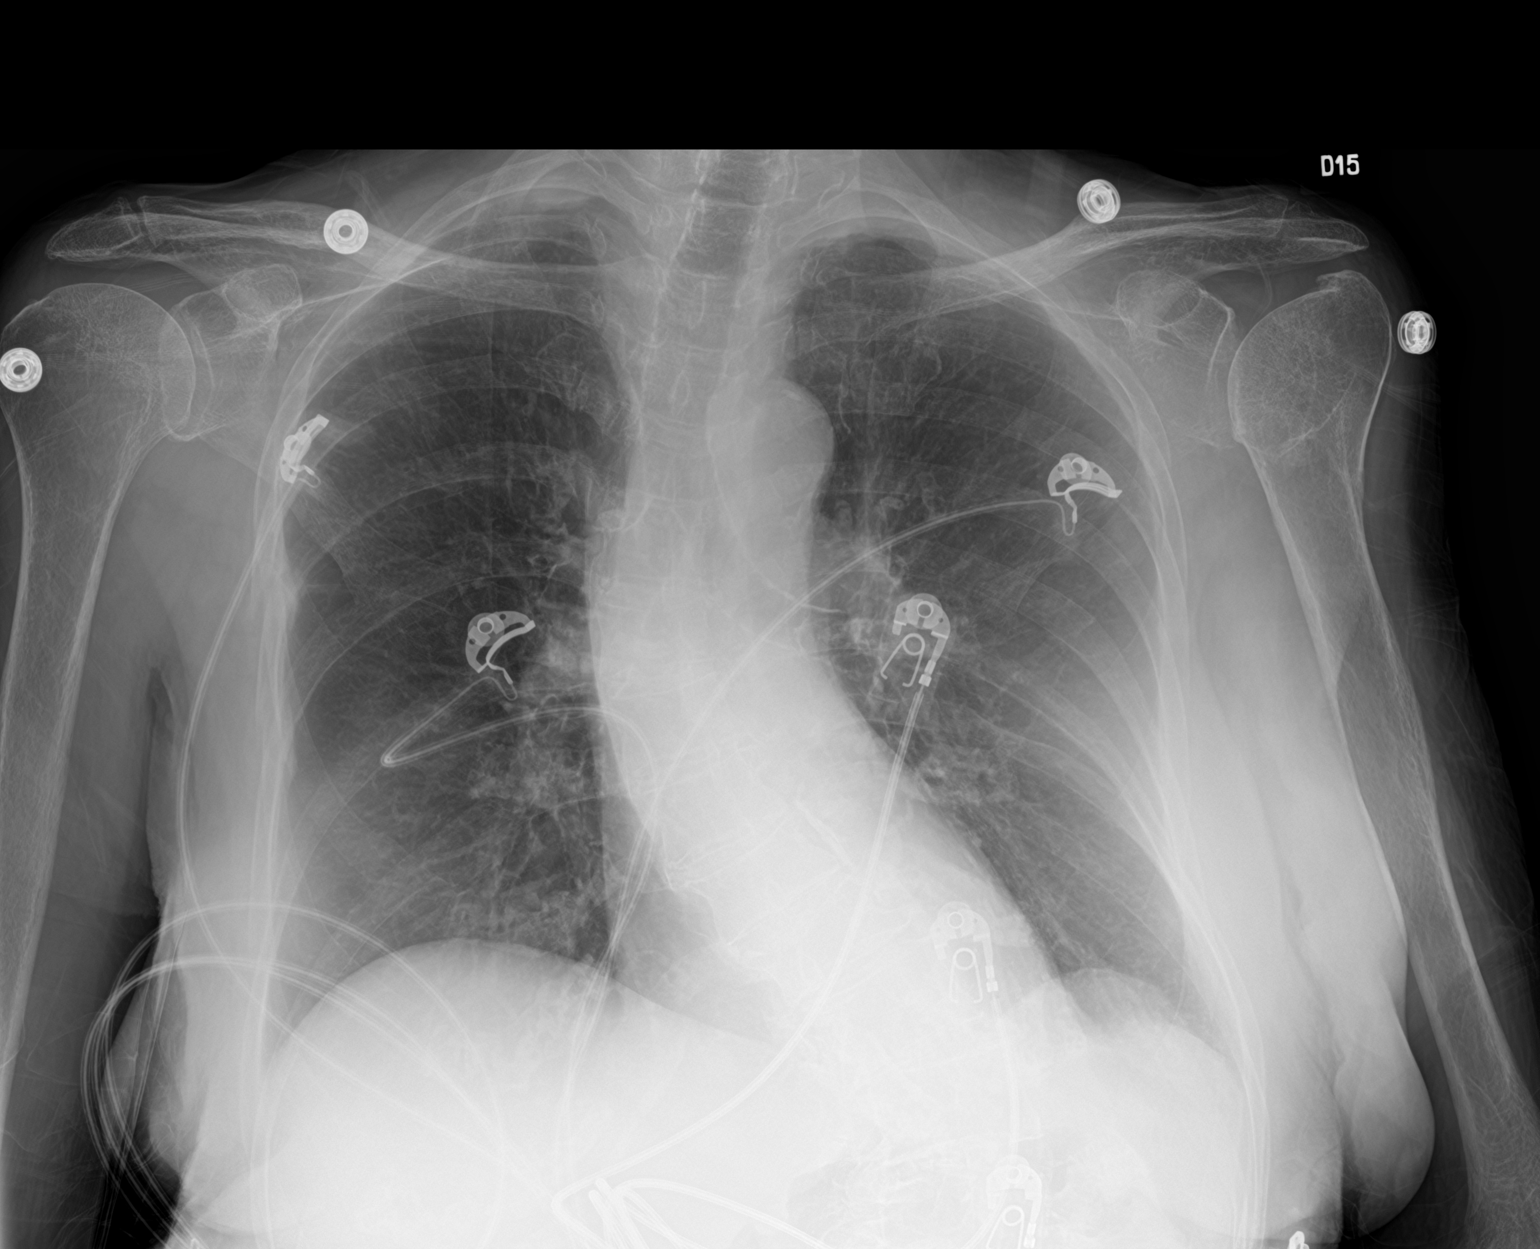

[2 of 2 positions shown; findings below may reference images not displayed]

FINDINGS: Thoracic deformity secondary to marked biconvex thoracolumbar
scoliosis.

Normal heart size, mediastinal contours, and pulmonary vascularity.

Atherosclerotic calcification aorta.

Emphysematous and bronchitic changes consistent with COPD.

Streaky atelectasis at lung bases.

No acute infiltrate, pleural effusion, or pneumothorax.

Bones demineralized with fractures of lateral RIGHT fourth fifth and
sixth ribs again identified.
IMPRESSION: Multiple lateral RIGHT rib fractures.

COPD changes with mild bibasilar atelectasis.

## 2021-01-09 IMAGING — CT CT ABD-PELV W/O CM
2 of 4 series · 16 of 46 positions shown, 18 images · non-contrast
Comparison: 03/08/2020

CLINICAL DATA: Recent trip and fall with abdominal pain, initial
encounter

EXAM:
CT ABDOMEN AND PELVIS WITHOUT CONTRAST
TECHNIQUE: Multidetector CT imaging of the abdomen and pelvis was performed
following the standard protocol without IV contrast.

[Series 3: abd/ pelvis 5.0 i30f 2 (person_name) · axial · 0.88mm/px · z∈[-826,-426]mm · 13 of 88 slices shown, 15 images]
[im 4/88  soft-tissue]
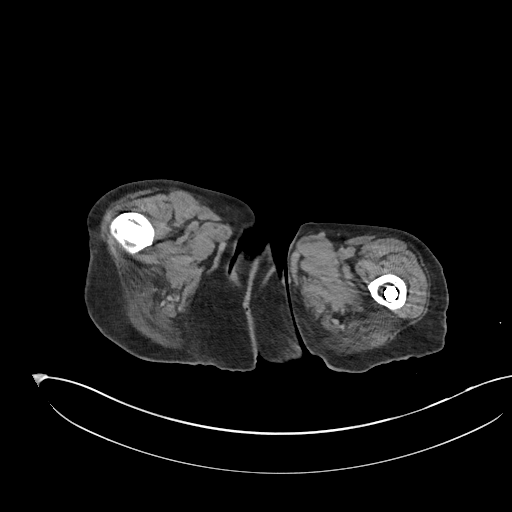
[im 4/88  bone]
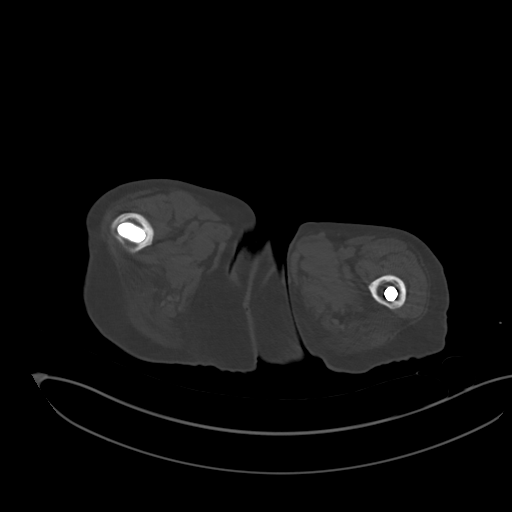
[im 11/88  soft-tissue]
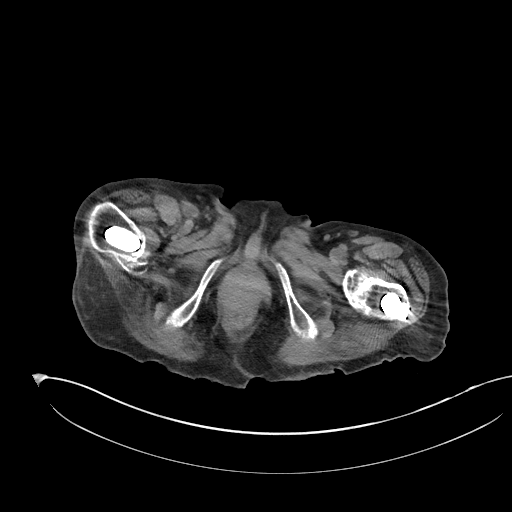
[im 18/88  soft-tissue]
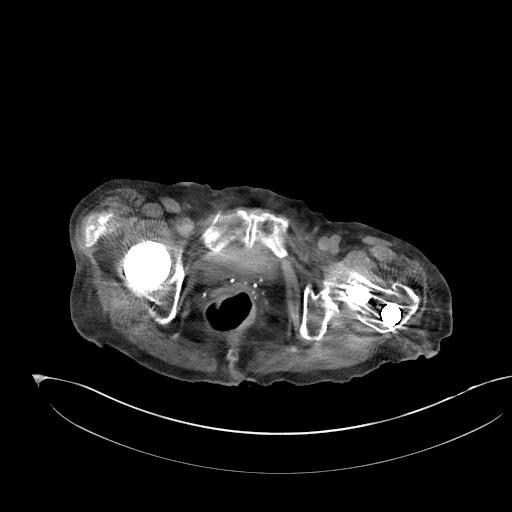
[im 25/88  soft-tissue]
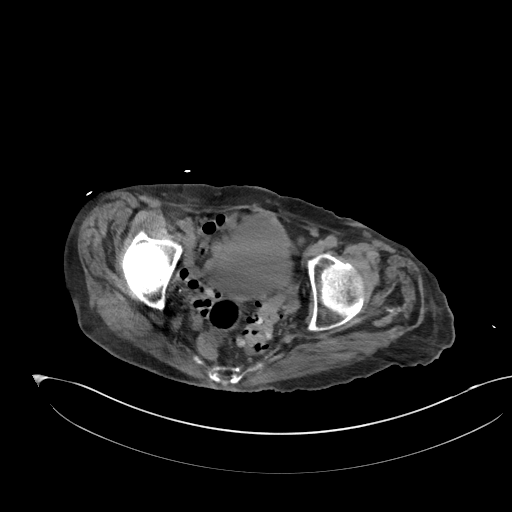
[im 32/88  soft-tissue]
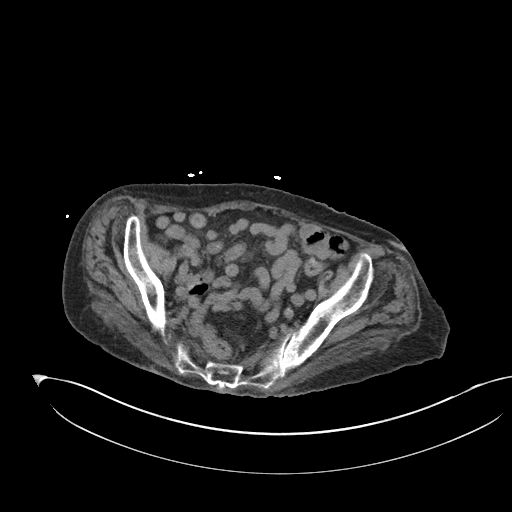
[im 39/88  soft-tissue]
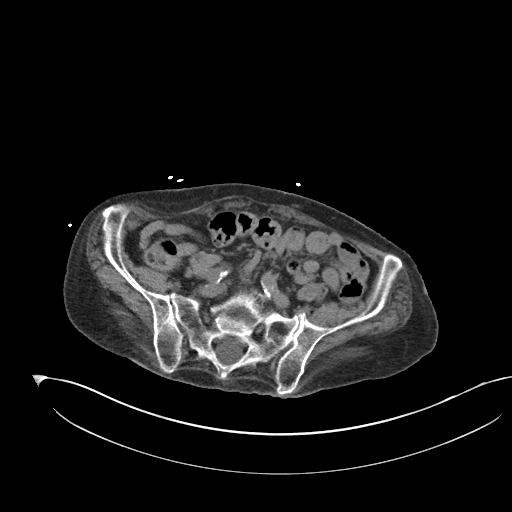
[im 46/88  soft-tissue]
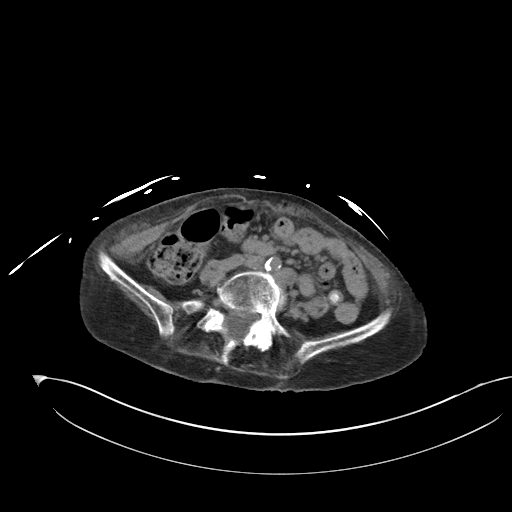
[im 49/88  soft-tissue]
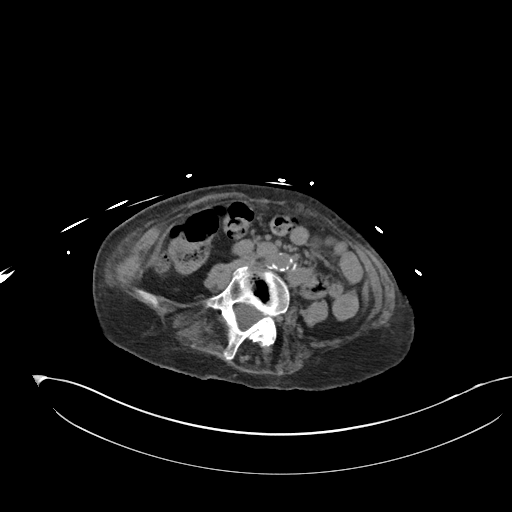
[im 56/88  soft-tissue]
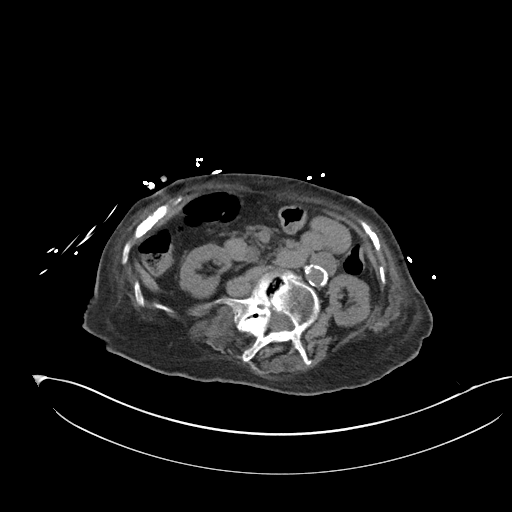
[im 56/88  bone]
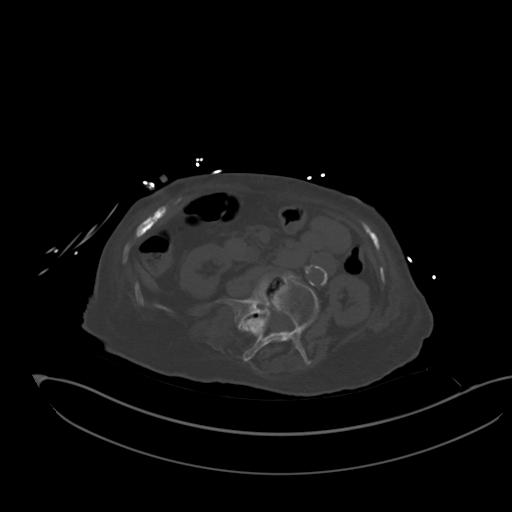
[im 63/88  soft-tissue]
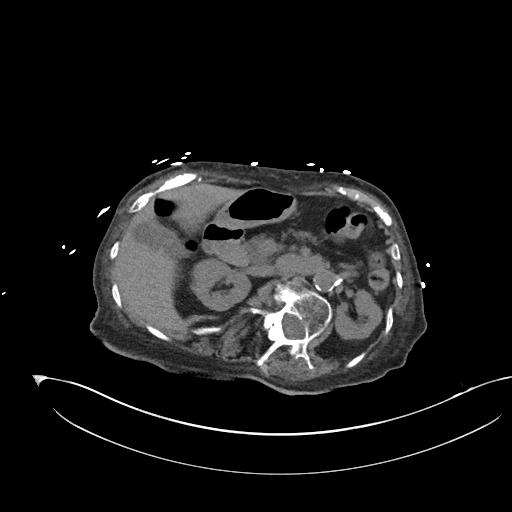
[im 70/88  soft-tissue]
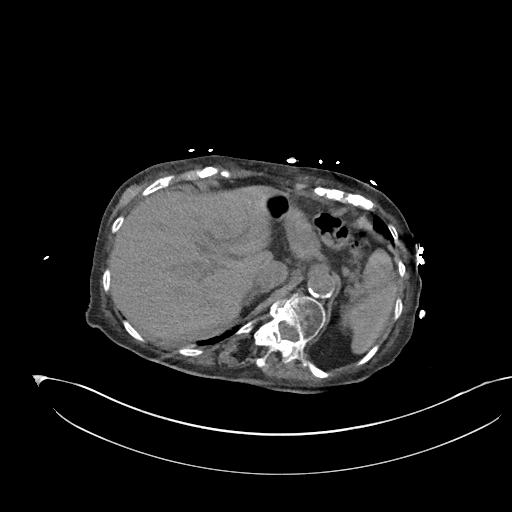
[im 77/88  soft-tissue]
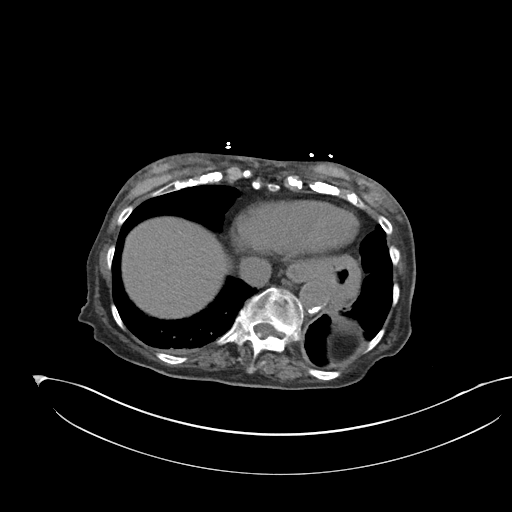
[im 84/88  soft-tissue]
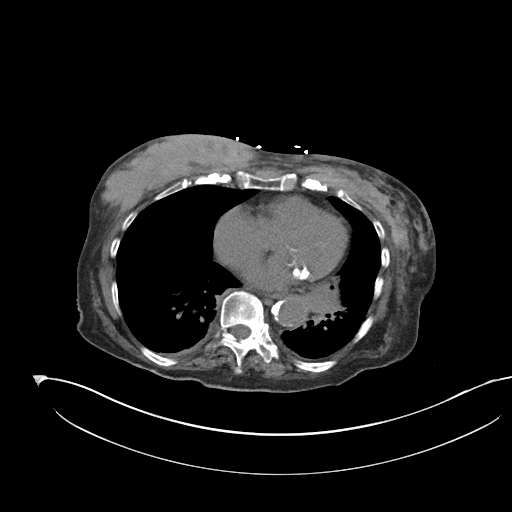

[Series 6: cor st · coronal · 0.74mm/px · 3 of 90 slices shown]
[im 30/90  soft-tissue]
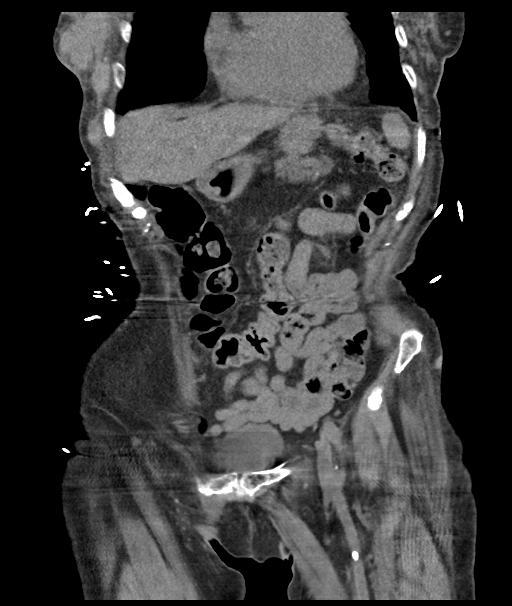
[im 40/90  soft-tissue]
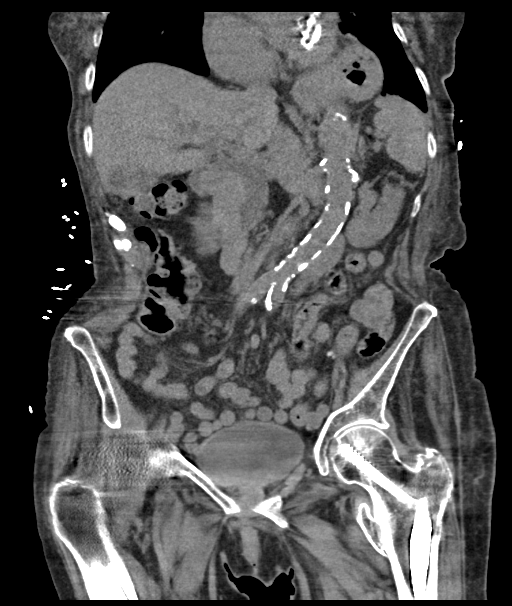
[im 50/90  soft-tissue]
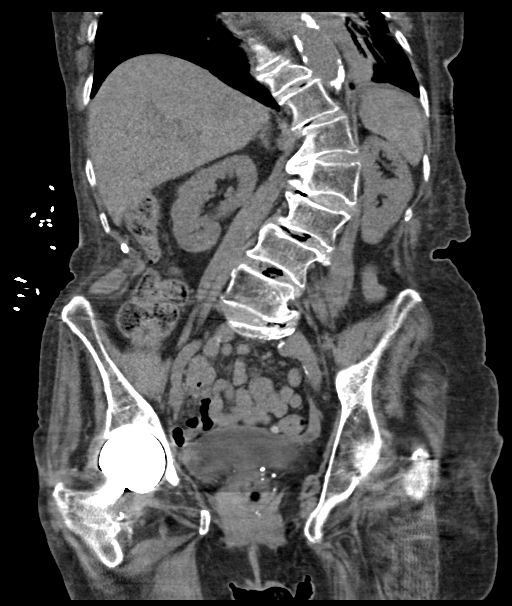

[16 of 46 positions shown; findings below may reference images not displayed]

FINDINGS: Lower chest: Lung bases again demonstrate some mild scarring stable
from the prior exam. Hiatal hernia is again

Hepatobiliary: No focal liver abnormality is seen. No gallstones,
gallbladder wall thickening, or biliary dilatation.

Pancreas: Unremarkable. No pancreatic ductal dilatation or
surrounding inflammatory changes.

Spleen: Normal in size without focal abnormality.

Adrenals/Urinary Tract: Adrenal glands are within normal limits.
Kidneys demonstrate no renal calculi or obstructive changes. Bladder
is well distended.

Stomach/Bowel: Diverticular change of the colon is noted without
evidence of diverticulitis. No obstructive or inflammatory changes
are noted. The appendix is not well visualized consistent with prior
surgical excision. Small bowel is unremarkable. No gastric
abnormality is noted with the exception of the previously mentioned
hiatal hernia.

Vascular/Lymphatic: Aortic calcifications are noted. No significant
lymphadenopathy is noted. Left-sided IVC is noted and stable.

Reproductive: Status post hysterectomy. No adnexal masses.

Other: No abdominal wall hernia or abnormality. No abdominopelvic
ascites.

Musculoskeletal: Right hip replacement is seen. Surgical fixation of
the proximal left hip is noted. Degenerative changes of the lumbar
spine are seen. Scoliosis concave to the right is again noted. No
acute abnormality is noted.
IMPRESSION: Chronic changes as described above without acute abnormality.

## 2021-01-09 IMAGING — DX DG CHEST 1V
1 series · 1 of 1 positions shown · non-contrast
Comparison: Frontal and lateral views 03/30/2020. Chest CT
03/08/2020

CLINICAL DATA: Pain after fall.

EXAM:
CHEST  1 VIEW

[chest ap]
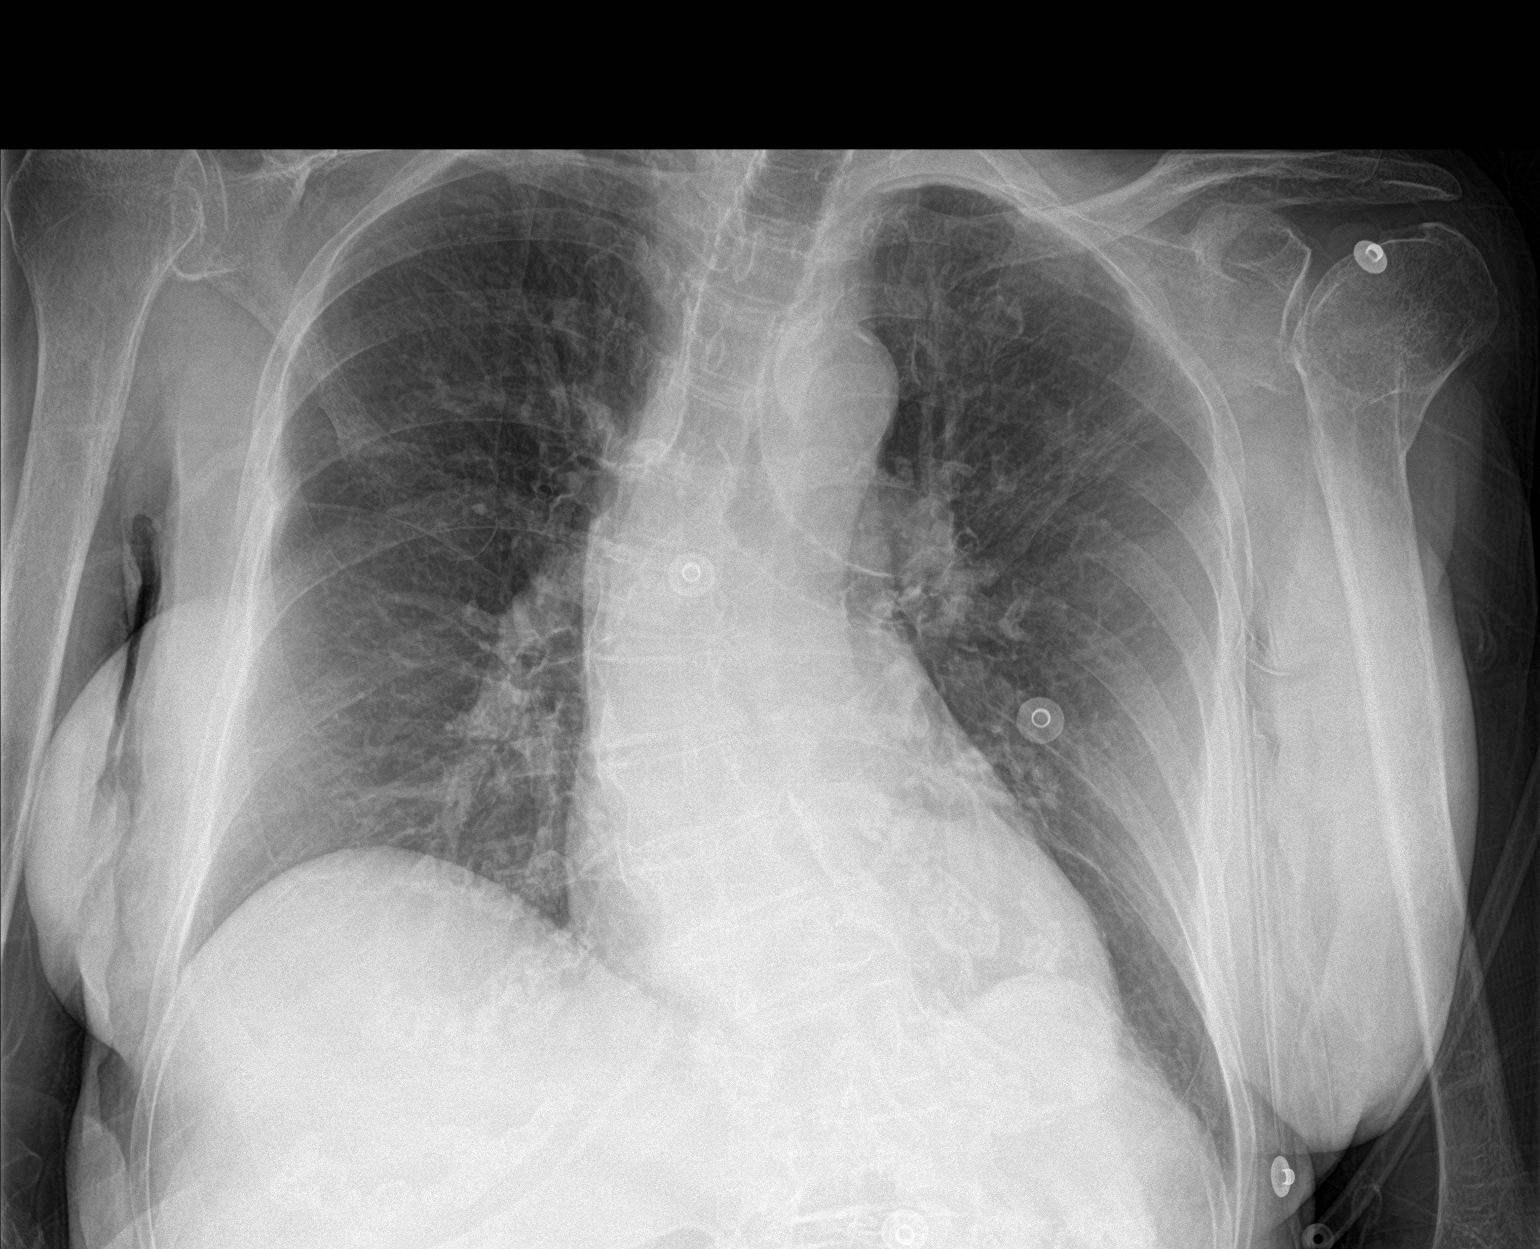

[1 of 1 positions shown; findings below may reference images not displayed]

FINDINGS: Normal heart size. Unchanged mediastinal contours with aortic
atherosclerosis. No focal airspace disease. No pneumothorax or
pleural effusion. Healing right rib fractures again seen. Remote
left proximal humerus fracture. No acute osseous abnormalities are
seen on this single frontal view. Scoliotic curvature of the spine.
IMPRESSION: No acute finding.

## 2021-01-09 IMAGING — CT CT HEAD W/O CM
4 series · 15 of 47 positions shown, 17 images · non-contrast
Comparison: 03/08/2020

CLINICAL DATA: Recent fall

EXAM:
CT HEAD WITHOUT CONTRAST
CT CERVICAL SPINE WITHOUT CONTRAST
TECHNIQUE: Multidetector CT imaging of the head and cervical spine was
performed following the standard protocol without intravenous
contrast. Multiplanar CT image reconstructions of the cervical spine
were also generated.

[Series 3: head without · axial · non-contrast · 0.42mm/px · z∈[-184,-64]mm · 7 of 34 slices shown, 9 images]
[im 5/34  brain]
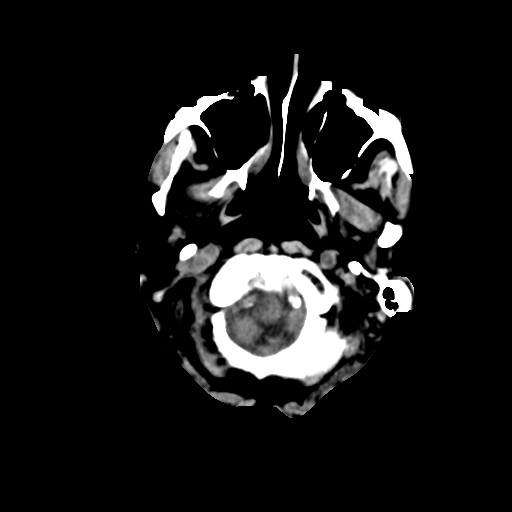
[im 5/34  bone]
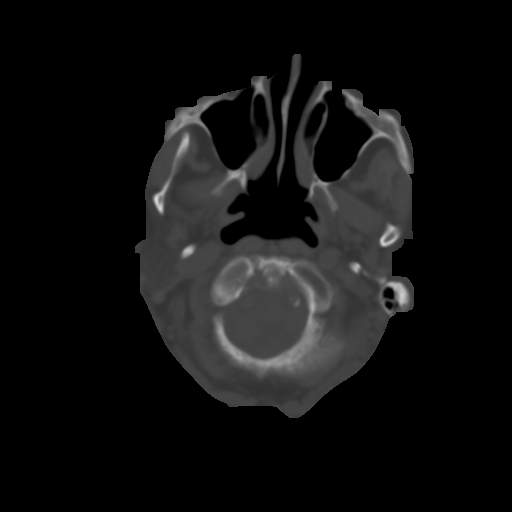
[im 9/34  brain]
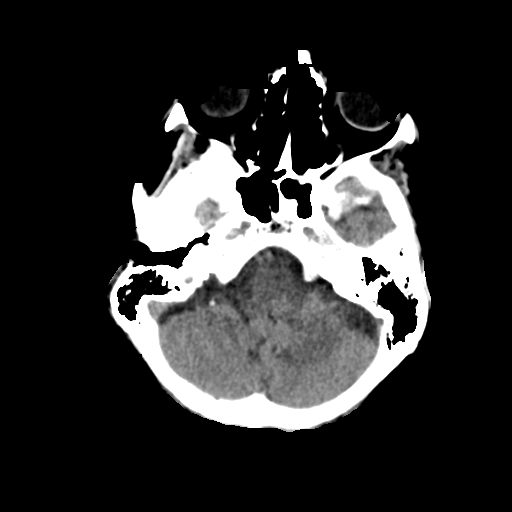
[im 13/34  brain]
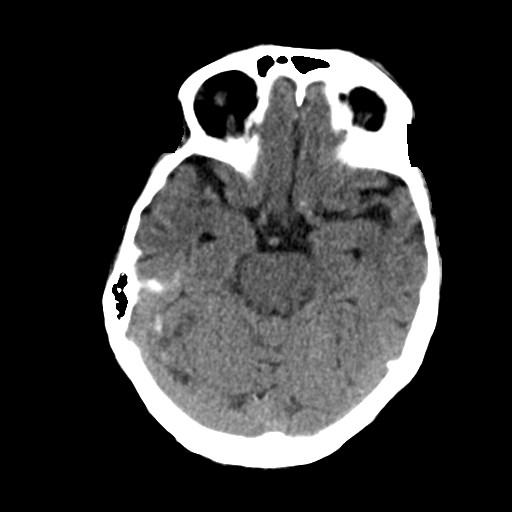
[im 17/34  brain]
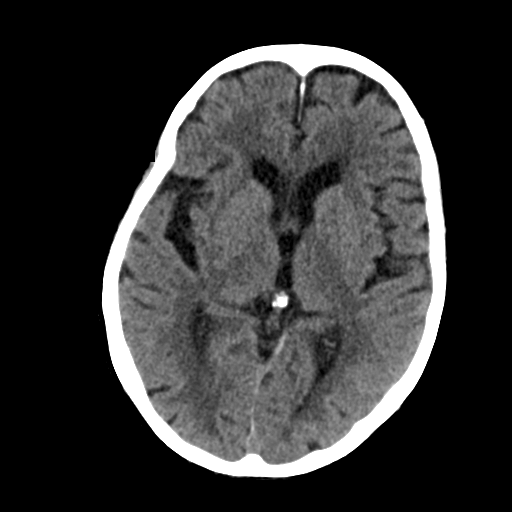
[im 21/34  brain]
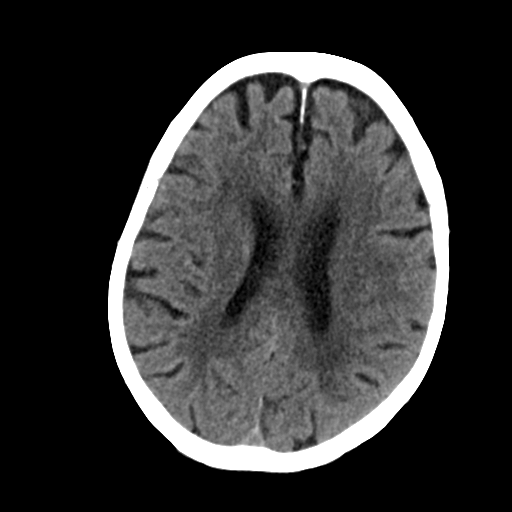
[im 21/34  bone]
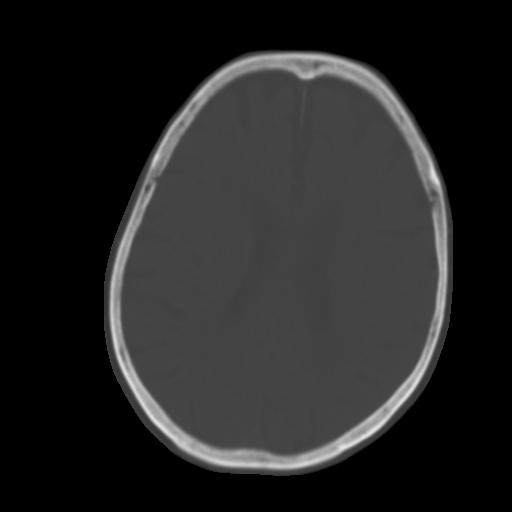
[im 25/34  brain]
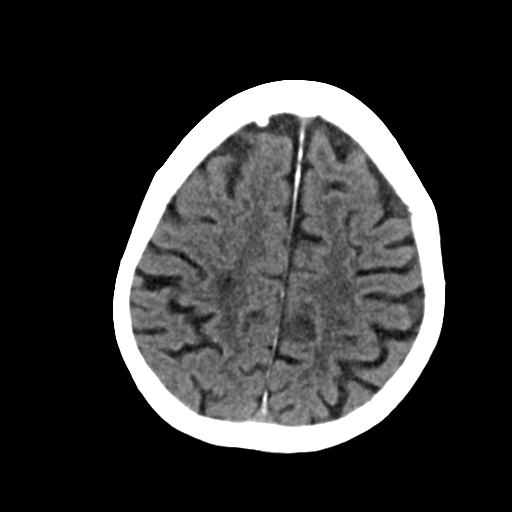
[im 29/34  brain]
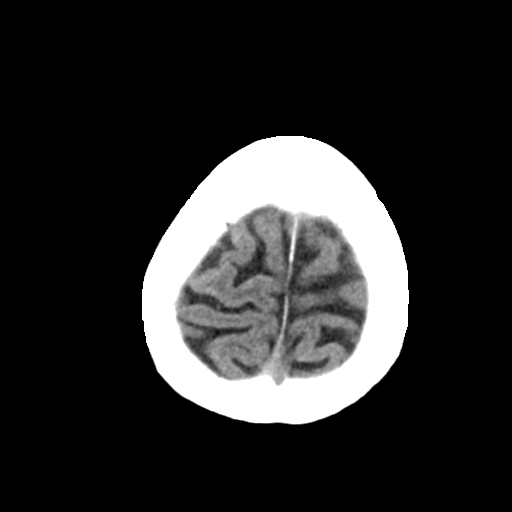

[Series 4: head bone · axial · 0.42mm/px · z∈[-188,-172]mm · 2 of 85 slices shown]
[im 9/85  bone]
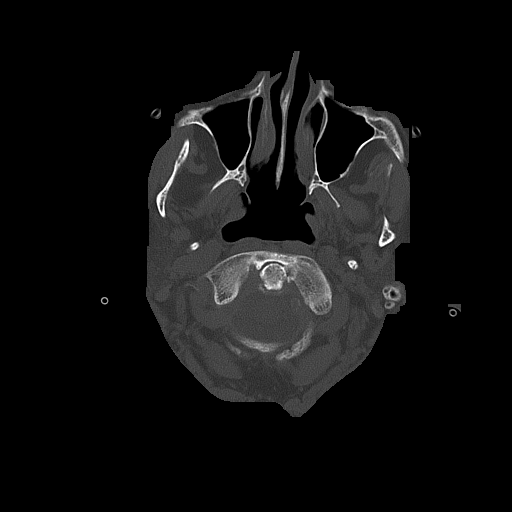
[im 17/85  bone]
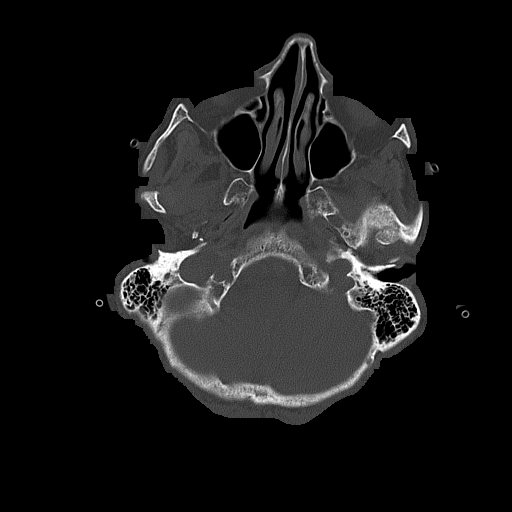

[Series 5: head without cor · coronal · non-contrast · 0.33mm/px · 3 of 72 slices shown]
[im 24/72  brain]
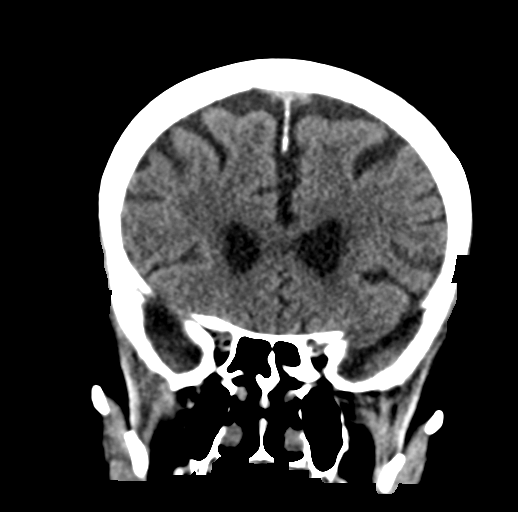
[im 32/72  brain]
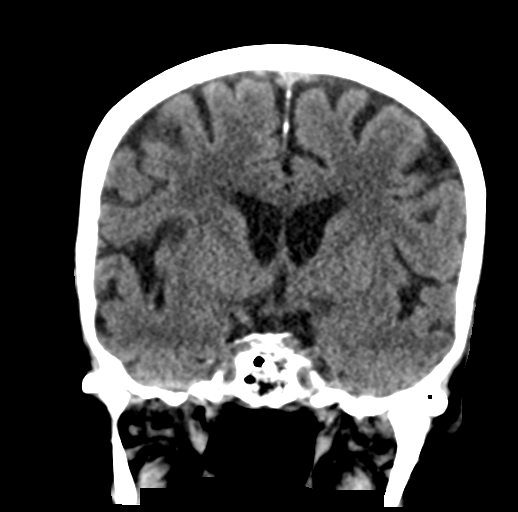
[im 40/72  brain]
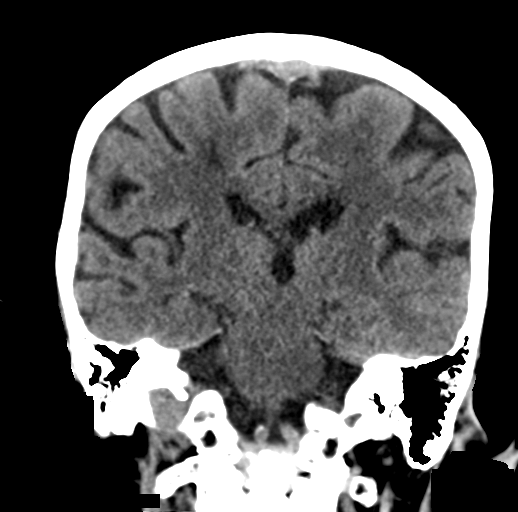

[Series 6: head without sag · sagittal · non-contrast · 0.33mm/px · 3 of 66 slices shown]
[im 22/66  brain]
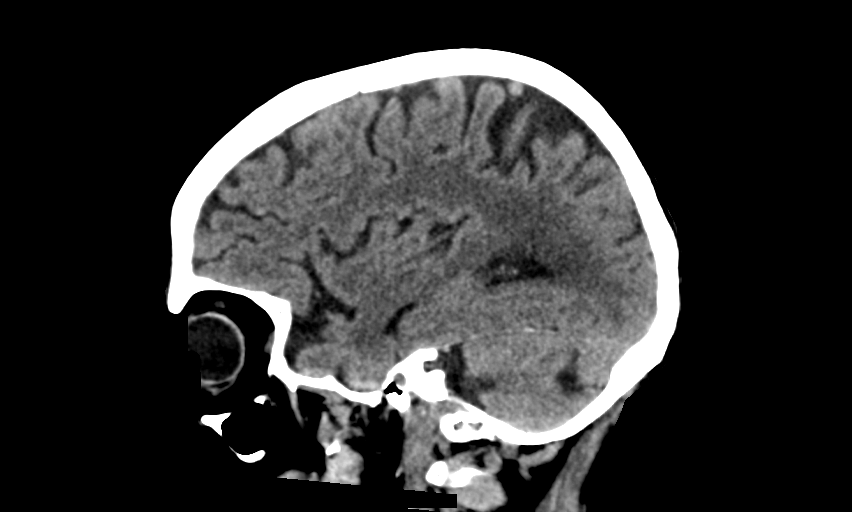
[im 33/66  brain]
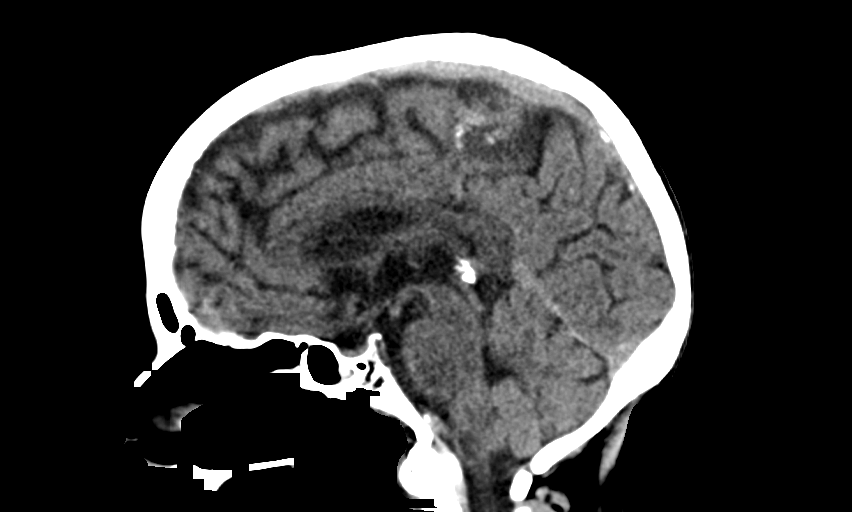
[im 44/66  brain]
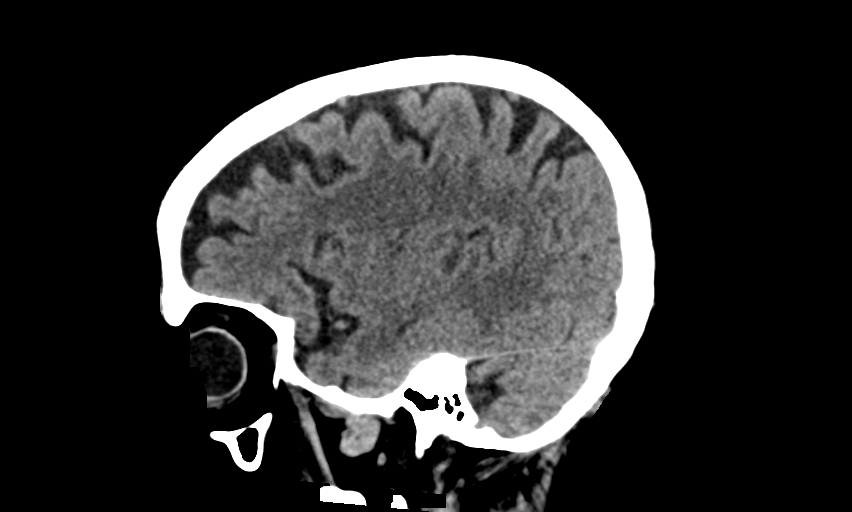

[15 of 47 positions shown; findings below may reference images not displayed]

FINDINGS: CT HEAD FINDINGS

Brain: Chronic atrophic and ischemic changes are noted. No findings
to suggest acute hemorrhage, acute infarction or space-occupying
mass lesion are seen.

Vascular: No hyperdense vessel or unexpected calcification.

Skull: Normal. Negative for fracture or focal lesion.

Sinuses/Orbits: No acute finding.

Other: None.

CT CERVICAL SPINE FINDINGS

Alignment: Within normal limits.

Skull base and vertebrae: 7 cervical segments are well visualized.
Vertebral body height is well maintained. Mild disc space narrowing
is noted from C4-C7 with associated osteophytic changes. Mild facet
hypertrophic changes are seen. The odontoid is within normal limits.
No findings to suggest acute fracture or acute facet abnormality are
noted.

Soft tissues and spinal canal: Surrounding soft tissue structures
show significant vascular calcifications. The thyroid demonstrates
multiple hypodense nodules. These are stable in appearance from a
prior CT from 7367 and felt to be benign in etiology.

Upper chest: Visualized lung apices are within normal limits.

Other: None
IMPRESSION: CT of the head: Chronic atrophic and ischemic changes without acute
abnormality.

CT of the cervical spine: Multilevel degenerative change without
acute abnormality.

Multiple hypodense nodules throughout the thyroid gland stable from
7367. No followup recommended (ref: [HOSPITAL]. 1243

## 2021-01-09 IMAGING — DX DG HIP (WITH OR WITHOUT PELVIS) 3-4V BILAT
5 series · 5 of 5 positions shown · non-contrast
Comparison: Hip radiograph 02/05/2020, right hip CT same day.

CLINICAL DATA: Pain after fall.

EXAM:
DG HIP (WITH OR WITHOUT PELVIS) 3-4V BILAT

[pelvis ap]
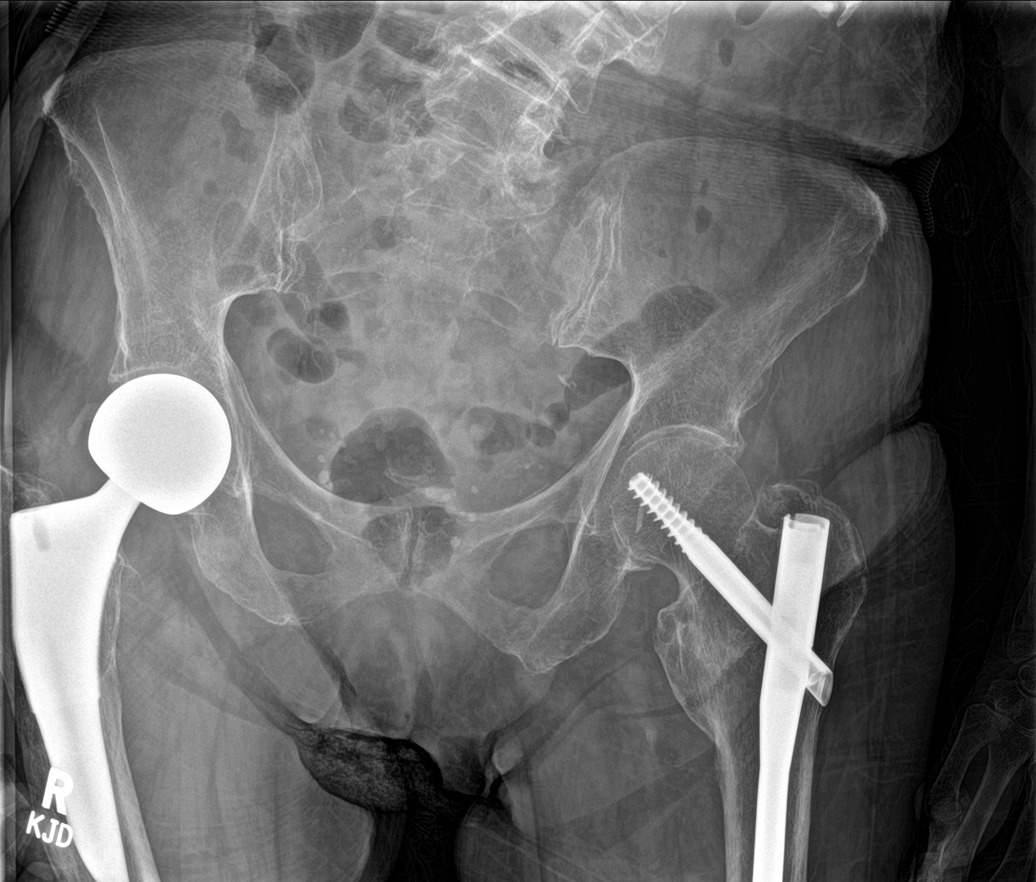

[hip ap (1 of 2)]
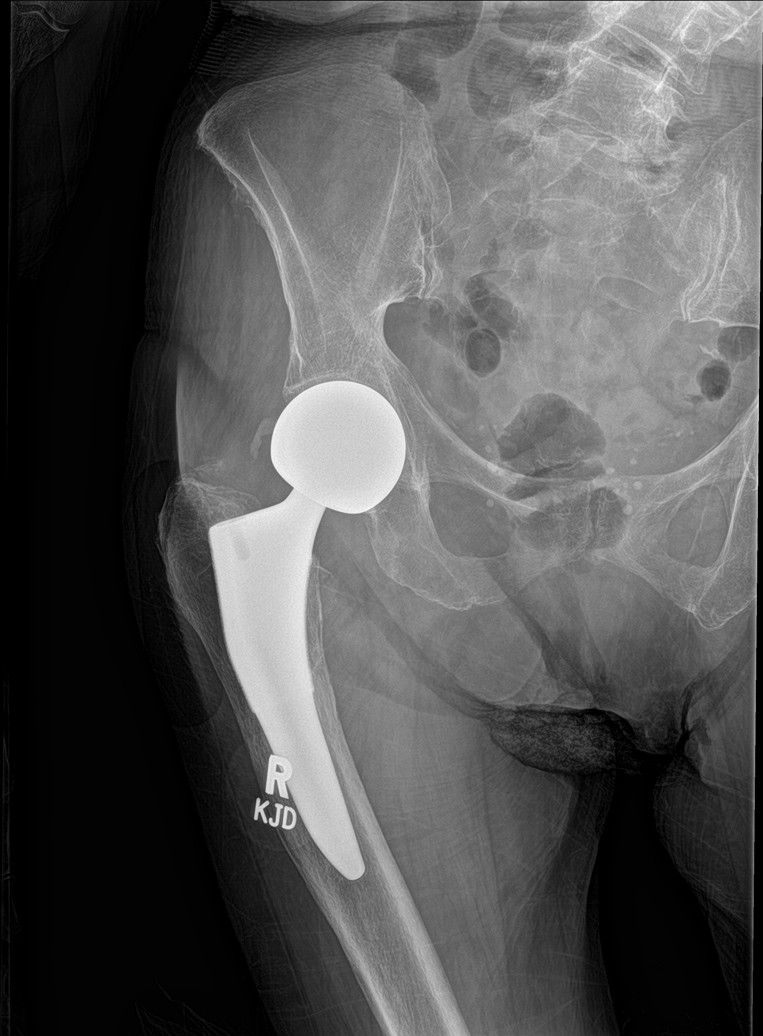

[hip lat (1 of 2)]
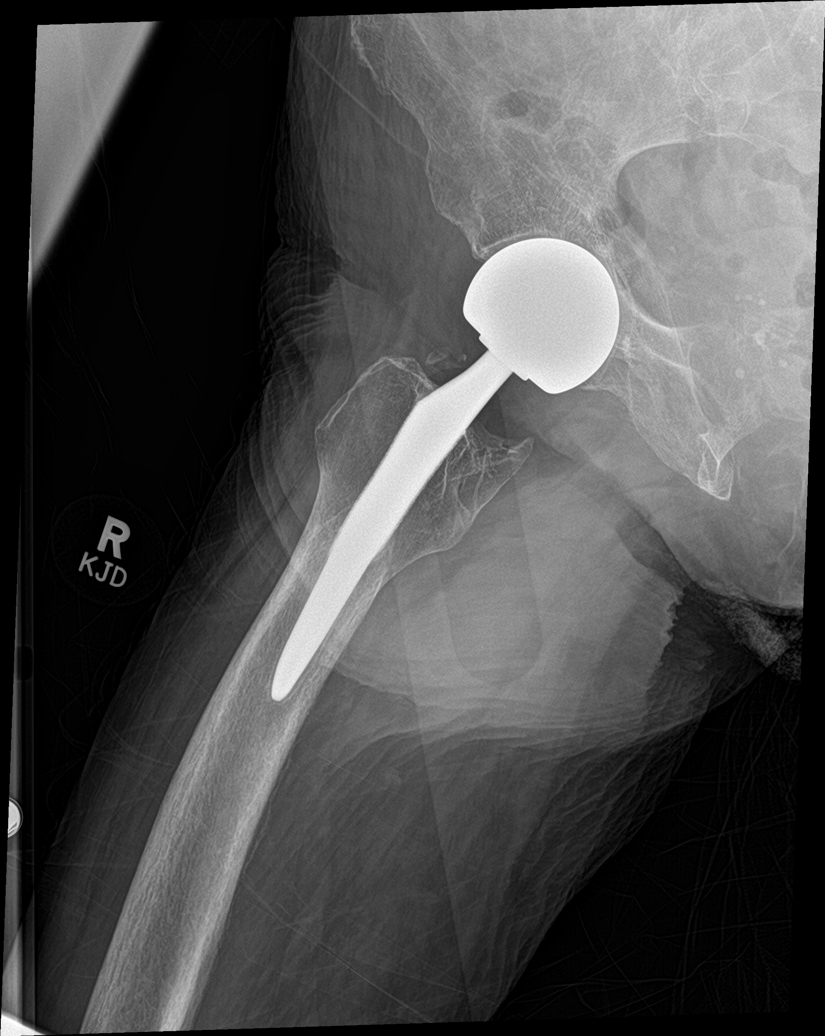

[hip ap (2 of 2)]
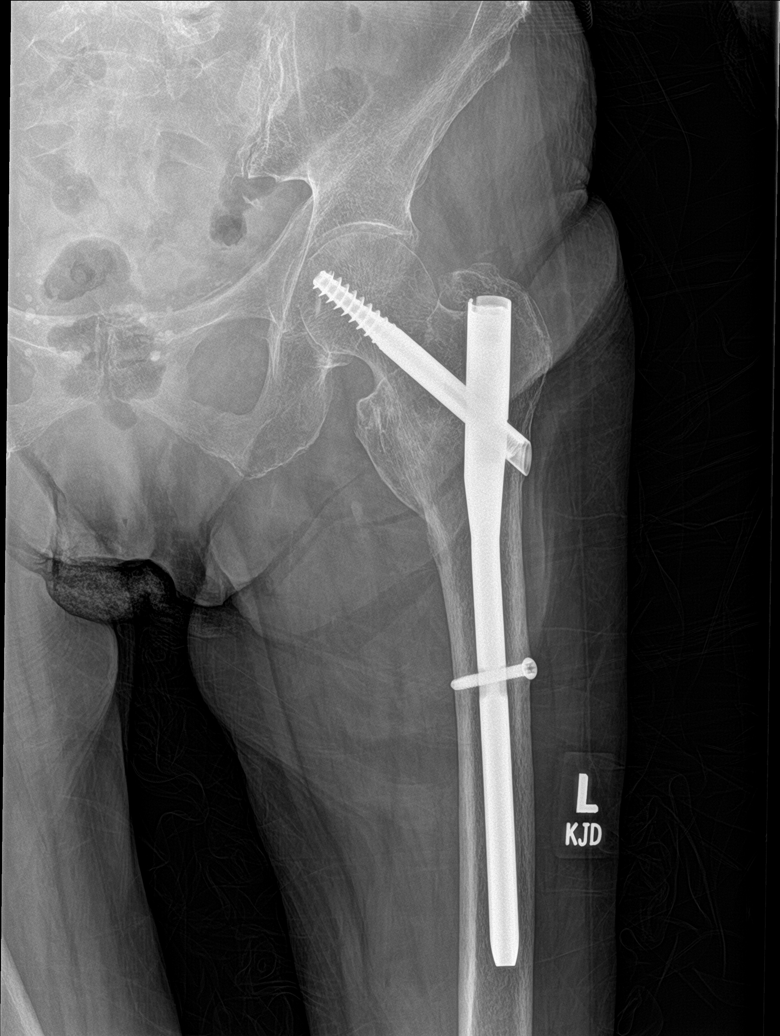

[hip lat (2 of 2)]
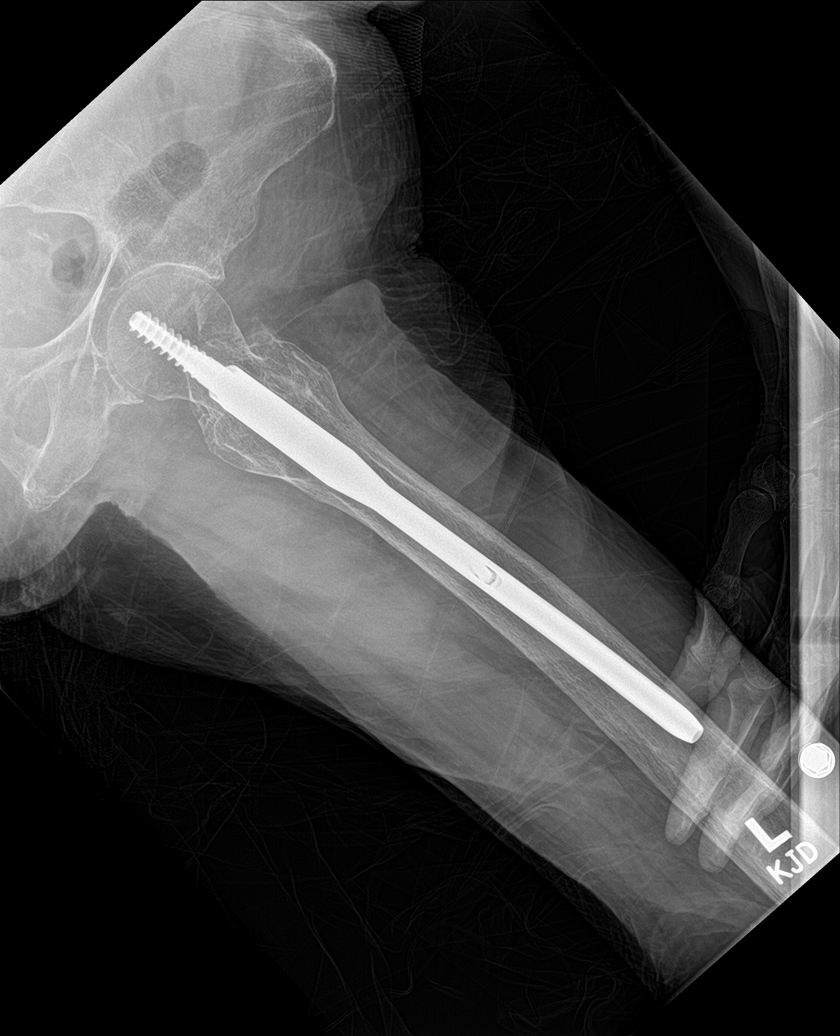

[5 of 5 positions shown; findings below may reference images not displayed]

FINDINGS: Right hip arthroplasty in expected alignment. No periprosthetic
lucency or fracture. Intramedullary rod with locking and trans
trochanteric screw in the left proximal femur. No periprosthetic
lucency or fracture. No evidence of pelvic ring or pubic rami
fracture. Degenerative change of the pubic symphysis and sacroiliac
joints. Scoliotic curvature of the included lower lumbar spine.
IMPRESSION: 1. No acute fracture of the pelvis or hips.
2. Intact right hip arthroplasty and surgical hardware in the left
proximal femur. No hardware complication or periprosthetic fracture.

## 2021-01-09 IMAGING — CT CT CERVICAL SPINE W/O CM
3 of 4 series · 13 of 33 positions shown, 16 images · non-contrast
Comparison: 03/08/2020

CLINICAL DATA: Recent fall

EXAM:
CT HEAD WITHOUT CONTRAST
CT CERVICAL SPINE WITHOUT CONTRAST
TECHNIQUE: Multidetector CT imaging of the head and cervical spine was
performed following the standard protocol without intravenous
contrast. Multiplanar CT image reconstructions of the cervical spine
were also generated.

[Series 4: c_spine 2.0 st · axial · 0.41mm/px · z∈[-312,-182]mm · 5 of 97 slices shown, 7 images]
[im 17/97  soft-tissue]
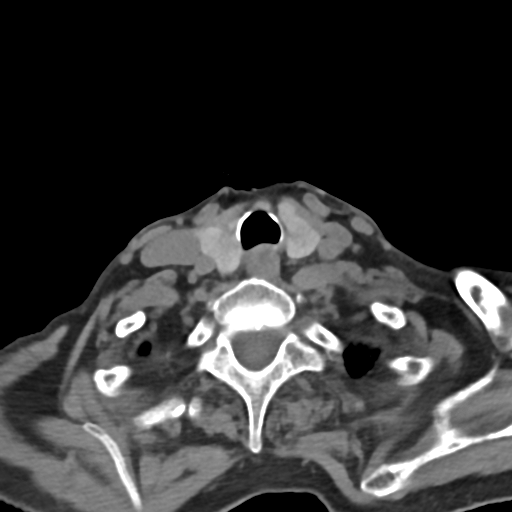
[im 17/97  bone]
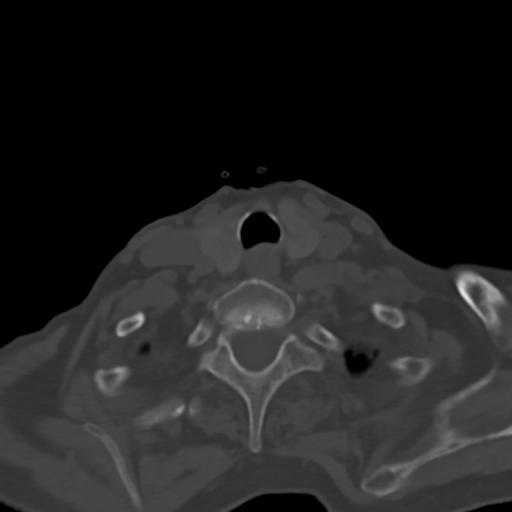
[im 33/97  bone]
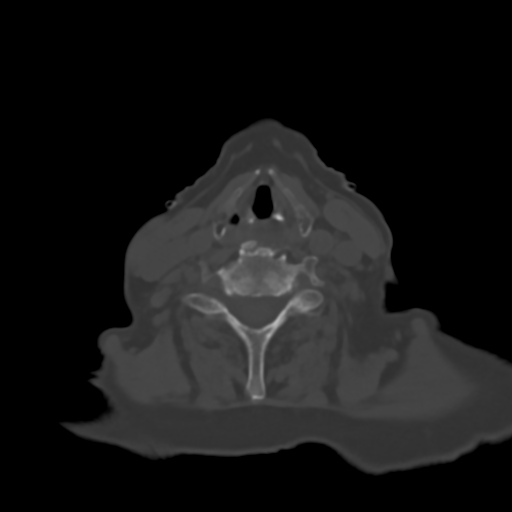
[im 49/97  bone]
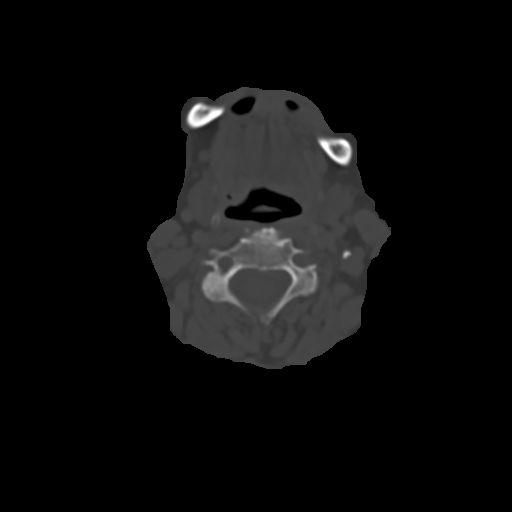
[im 65/97  bone]
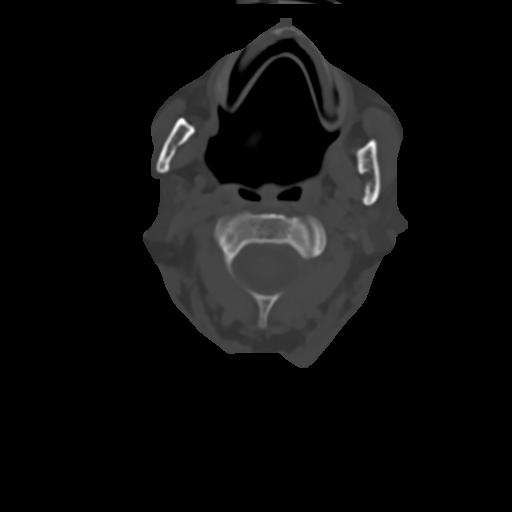
[im 81/97  soft-tissue]
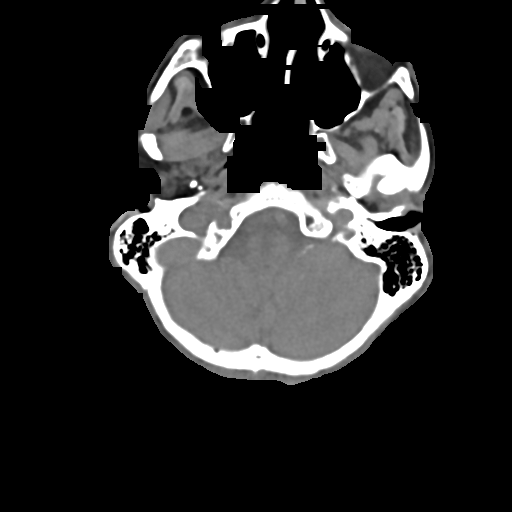
[im 81/97  bone]
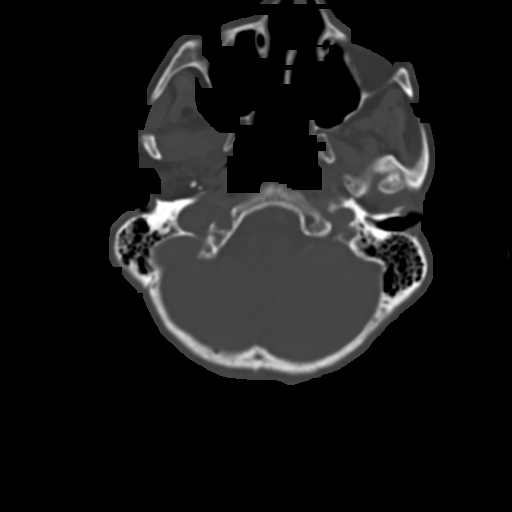

[Series 6: c_spine 2.0 sag bone · sagittal · 0.36mm/px · 5 of 61 slices shown, 6 images]
[im 21/61  bone]
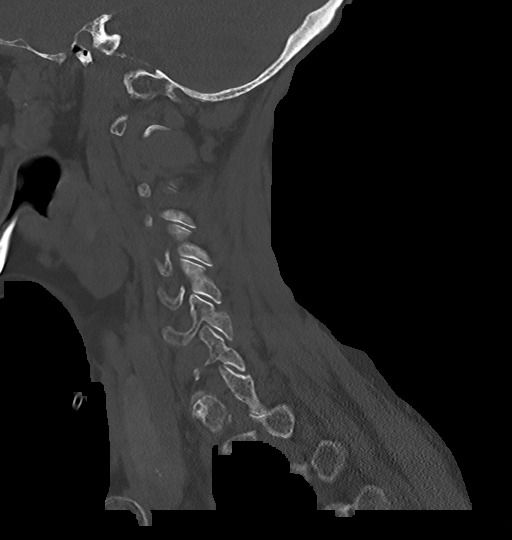
[im 26/61  bone]
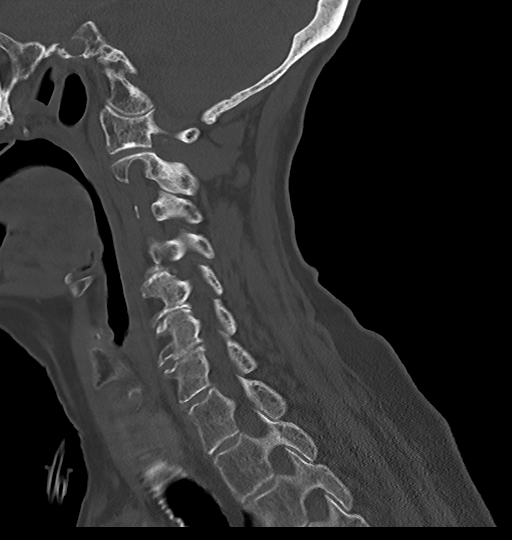
[im 31/61  soft-tissue]
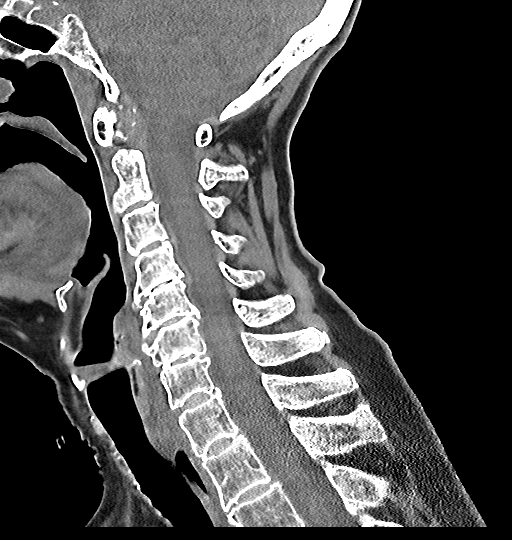
[im 31/61  bone]
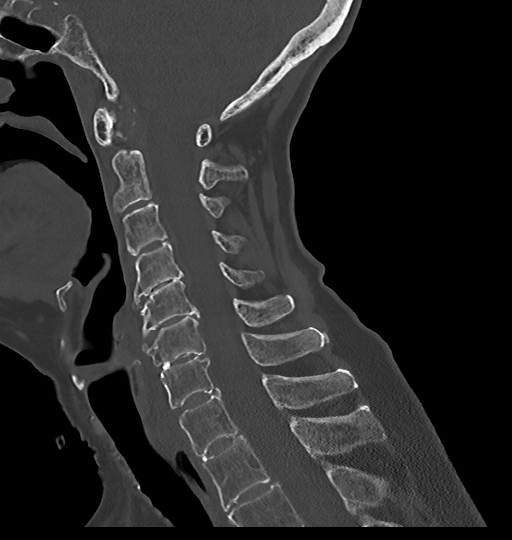
[im 36/61  bone]
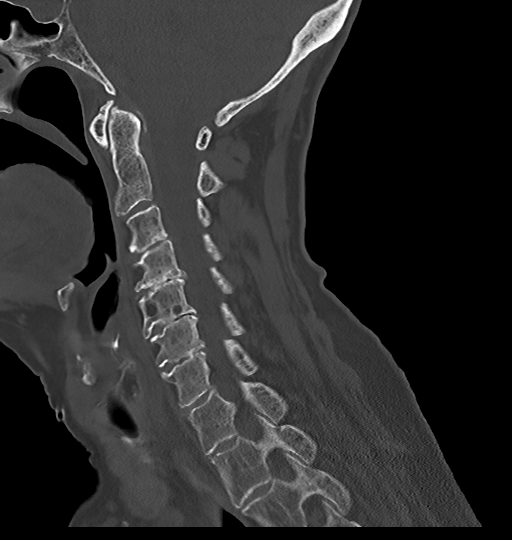
[im 41/61  bone]
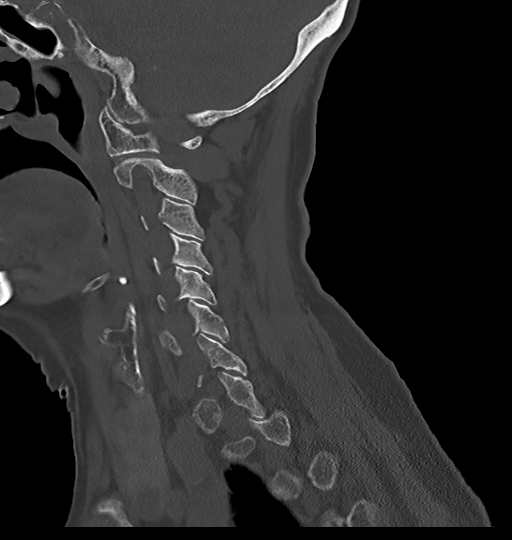

[Series 7: c_spine 2.0 cor bone · coronal · 0.29mm/px · 3 of 61 slices shown]
[im 13/61  bone]
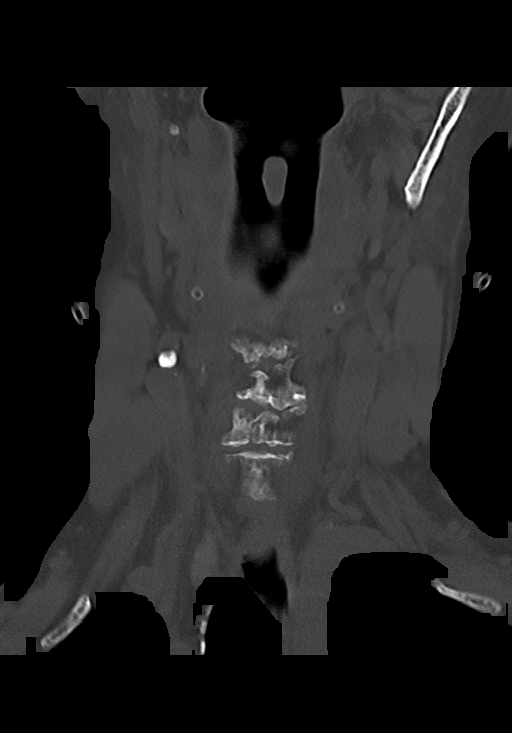
[im 25/61  bone]
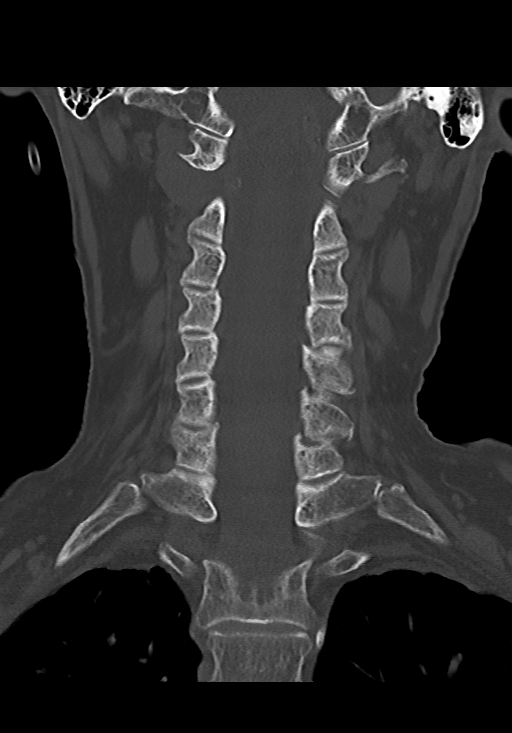
[im 37/61  bone]
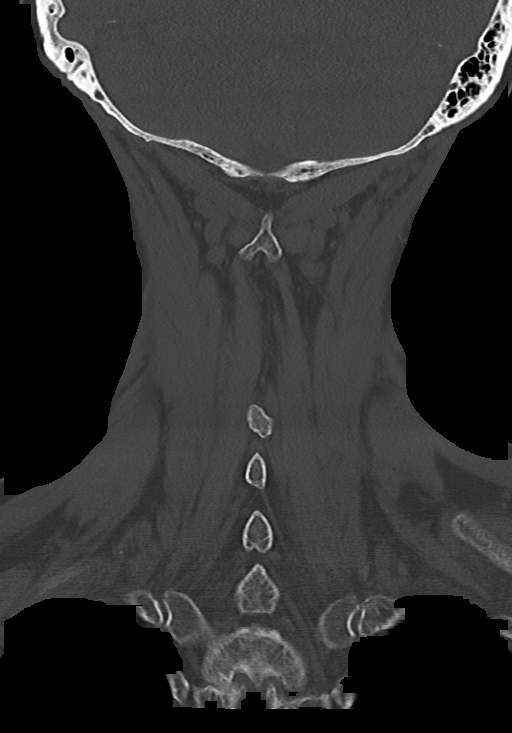

[13 of 33 positions shown; findings below may reference images not displayed]

FINDINGS: CT HEAD FINDINGS

Brain: Chronic atrophic and ischemic changes are noted. No findings
to suggest acute hemorrhage, acute infarction or space-occupying
mass lesion are seen.

Vascular: No hyperdense vessel or unexpected calcification.

Skull: Normal. Negative for fracture or focal lesion.

Sinuses/Orbits: No acute finding.

Other: None.

CT CERVICAL SPINE FINDINGS

Alignment: Within normal limits.

Skull base and vertebrae: 7 cervical segments are well visualized.
Vertebral body height is well maintained. Mild disc space narrowing
is noted from C4-C7 with associated osteophytic changes. Mild facet
hypertrophic changes are seen. The odontoid is within normal limits.
No findings to suggest acute fracture or acute facet abnormality are
noted.

Soft tissues and spinal canal: Surrounding soft tissue structures
show significant vascular calcifications. The thyroid demonstrates
multiple hypodense nodules. These are stable in appearance from a
prior CT from 7367 and felt to be benign in etiology.

Upper chest: Visualized lung apices are within normal limits.

Other: None
IMPRESSION: CT of the head: Chronic atrophic and ischemic changes without acute
abnormality.

CT of the cervical spine: Multilevel degenerative change without
acute abnormality.

Multiple hypodense nodules throughout the thyroid gland stable from
7367. No followup recommended (ref: [HOSPITAL]. 1243

## 2021-03-31 IMAGING — CT CT ABD-PELV W/ CM
2 of 5 series · 16 of 46 positions shown, 18 images · IV contrast (omnipaque)
Comparison: 05/12/2020

CLINICAL DATA: Acute abdominal pain

EXAM:
CT ABDOMEN AND PELVIS WITH CONTRAST
TECHNIQUE: Multidetector CT imaging of the abdomen and pelvis was performed
using the standard protocol following bolus administration of
intravenous contrast.
CONTRAST:  80mL OMNIPAQUE IOHEXOL 300 MG/ML  SOLN

[Series 3: a/p w/ 5mm · axial · 0.67mm/px · z∈[-626,-261]mm · 13 of 83 slices shown, 15 images]
[im 5/83  soft-tissue]
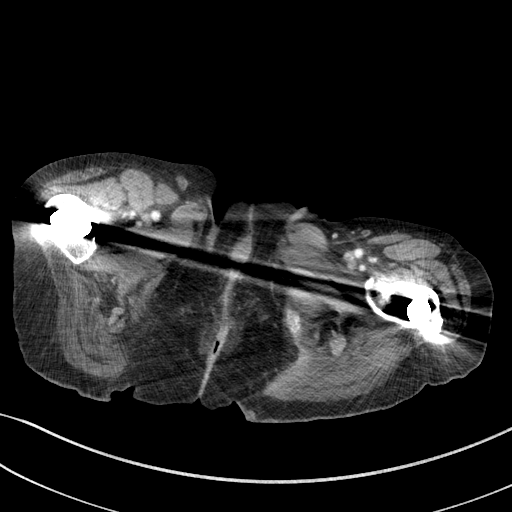
[im 5/83  bone]
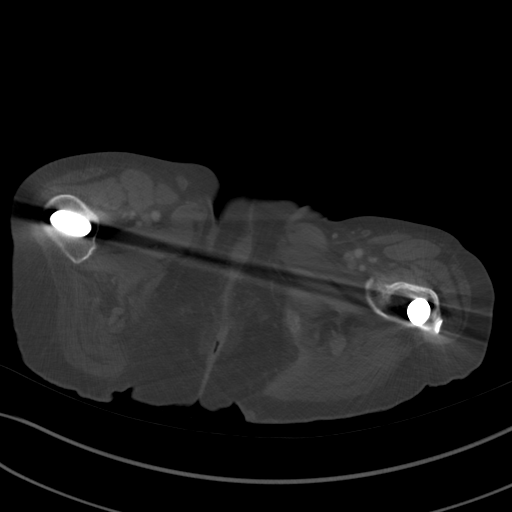
[im 13/83  soft-tissue]
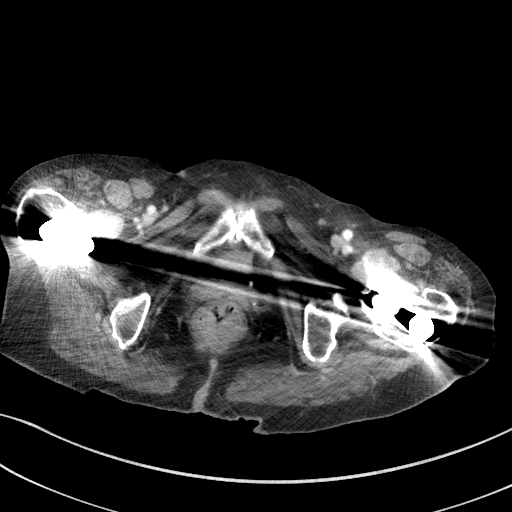
[im 17/83  soft-tissue]
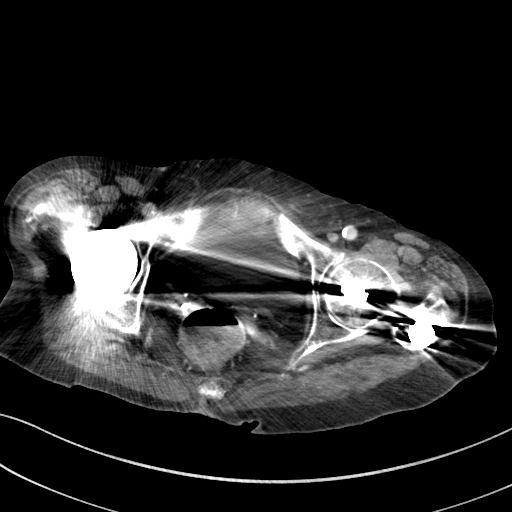
[im 25/83  soft-tissue]
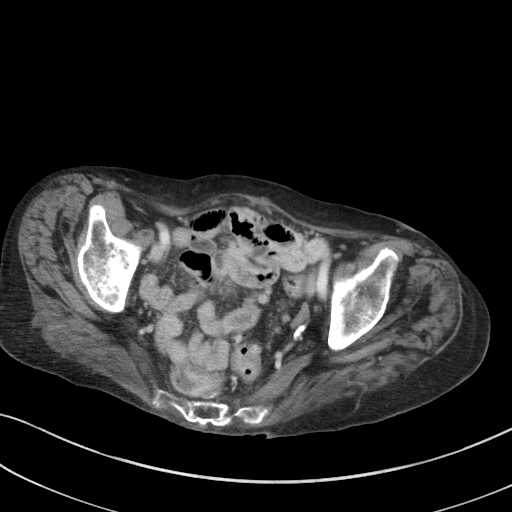
[im 29/83  soft-tissue]
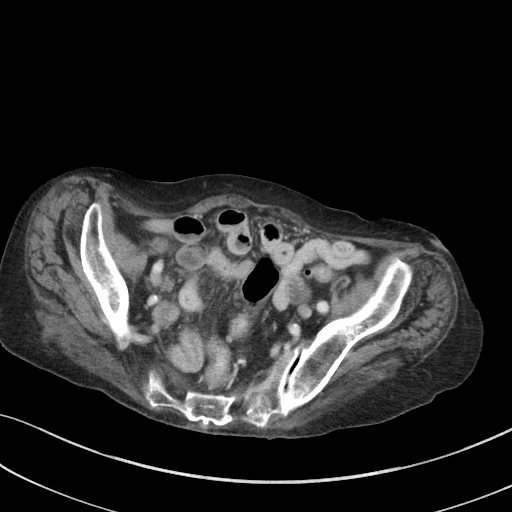
[im 37/83  soft-tissue]
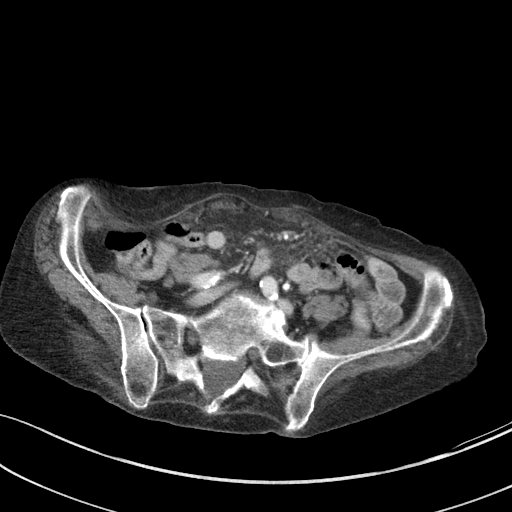
[im 42/83  soft-tissue]
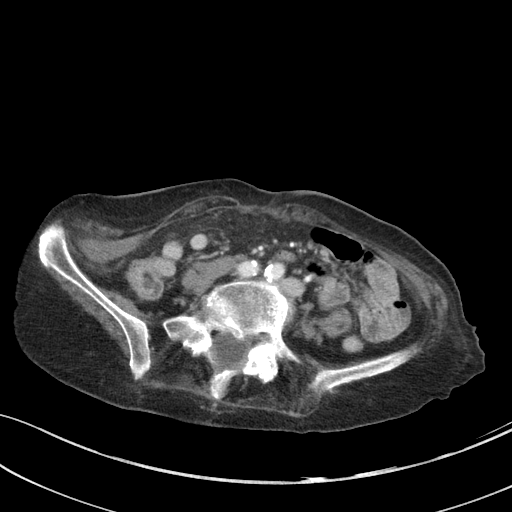
[im 46/83  soft-tissue]
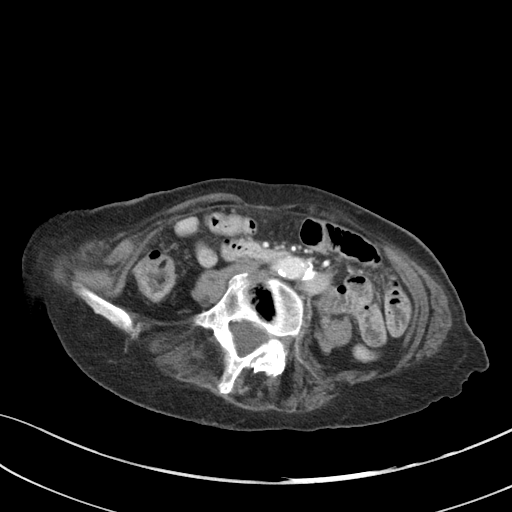
[im 54/83  soft-tissue]
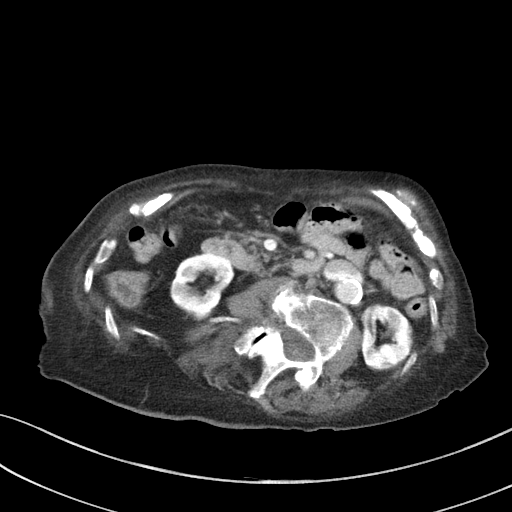
[im 54/83  bone]
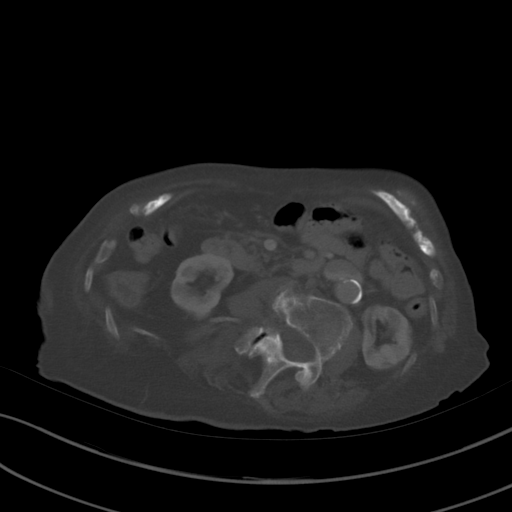
[im 58/83  soft-tissue]
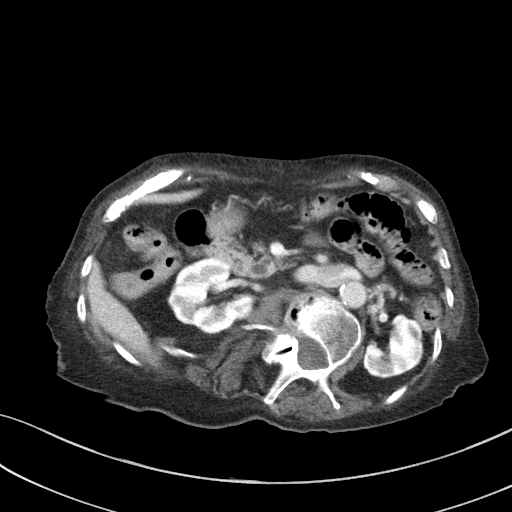
[im 66/83  soft-tissue]
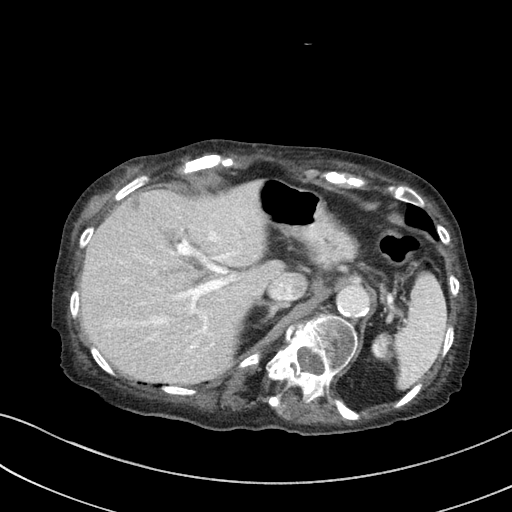
[im 70/83  soft-tissue]
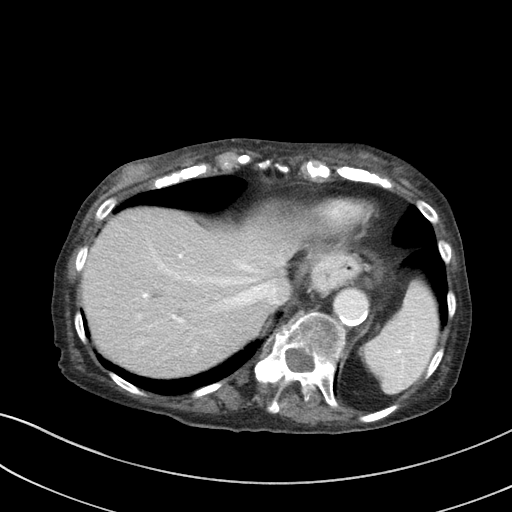
[im 78/83  soft-tissue]
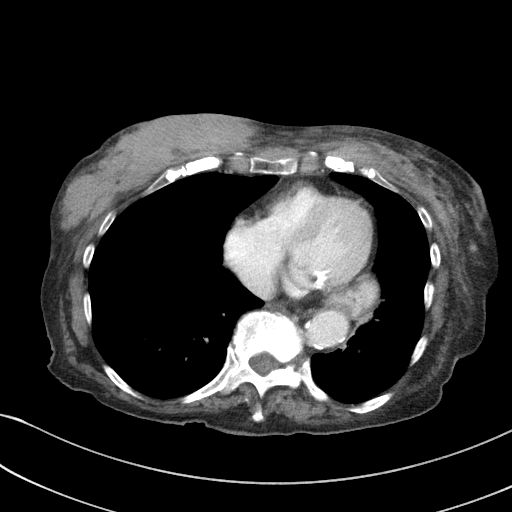

[Series 6: a/p w/ cor · coronal · 0.69mm/px · 3 of 105 slices shown]
[im 35/105  soft-tissue]
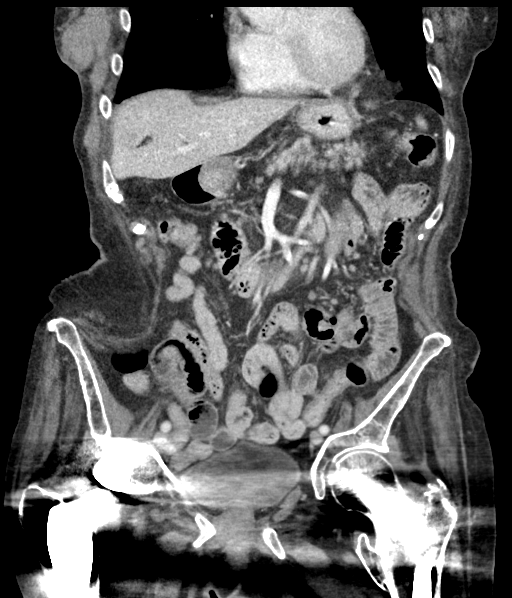
[im 47/105  soft-tissue]
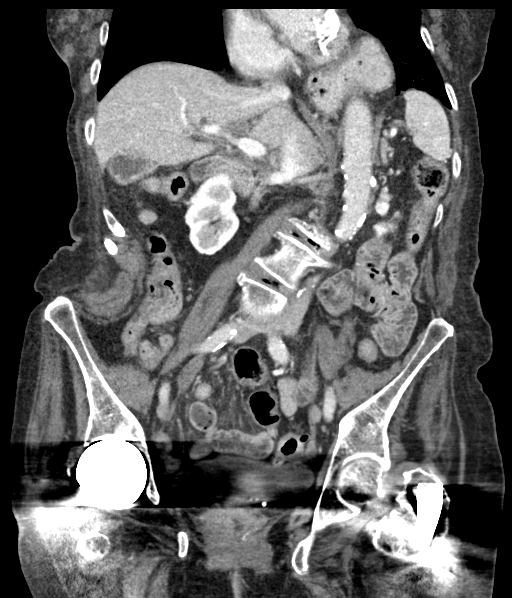
[im 58/105  soft-tissue]
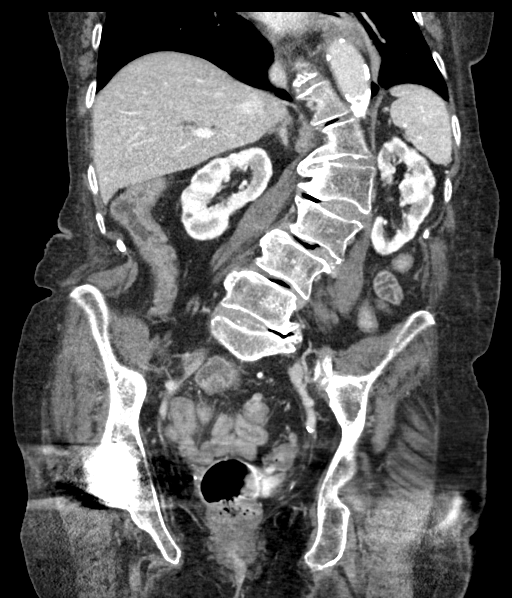

[16 of 46 positions shown; findings below may reference images not displayed]

FINDINGS: Lower chest: Lung bases are clear. Moderate esophageal hiatal
hernia.

Hepatobiliary: No focal liver abnormality is seen. No gallstones,
gallbladder wall thickening, or biliary dilatation.

Pancreas: Unremarkable. No pancreatic ductal dilatation or
surrounding inflammatory changes.

Spleen: Normal in size without focal abnormality.

Adrenals/Urinary Tract: Adrenal glands are unremarkable. Kidneys are
normal, without renal calculi, focal lesion, or hydronephrosis.
Bladder is unremarkable.

Stomach/Bowel: Stomach, small bowel, and colon are not abnormally
distended. No focal wall thickening or inflammatory changes are
appreciated. Scattered stool throughout the colon. Sigmoid
diverticulosis without evidence of diverticulitis.

Vascular/Lymphatic: Calcified and tortuous aorta. No aneurysm
identified.

Reproductive: Status post hysterectomy. No adnexal masses.

Other: No abdominal wall hernia or abnormality. No abdominopelvic
ascites.

Musculoskeletal: Postoperative changes in both hips. Degenerative
changes in the spine. Lumbar scoliosis convex towards the left.
Fatty atrophy of the paraspinal muscles.
IMPRESSION: 1. No acute process demonstrated in the abdomen or pelvis. No
evidence of bowel obstruction or inflammation.
2. Moderate esophageal hiatal hernia.
3. Sigmoid diverticulosis without evidence of diverticulitis.
4. Aortic atherosclerosis.
5. Postoperative changes in both hips. Degenerative changes in the
spine.

Aortic Atherosclerosis ([id number]).

## 2021-04-28 IMAGING — CT CT HEAD W/O CM
4 series · 16 of 47 positions shown, 18 images · non-contrast
Comparison: 05/12/2020

CLINICAL DATA: Delirium

EXAM:
CT HEAD WITHOUT CONTRAST
TECHNIQUE: Contiguous axial images were obtained from the base of the skull
through the vertex without intravenous contrast.

[Series 3: head wo · axial · 0.42mm/px · z∈[+1196,+1316]mm · 7 of 33 slices shown, 9 images]
[im 5/33  brain]
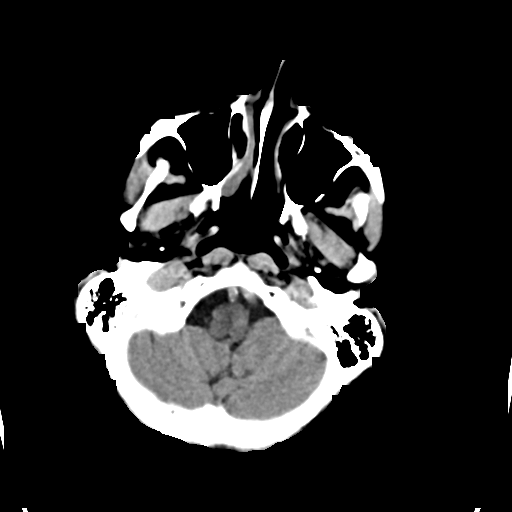
[im 5/33  bone]
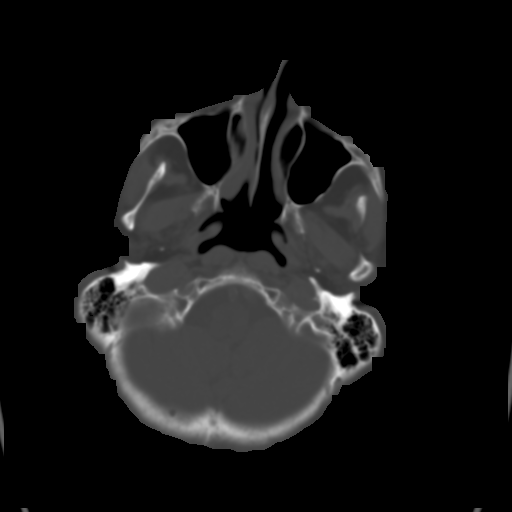
[im 9/33  brain]
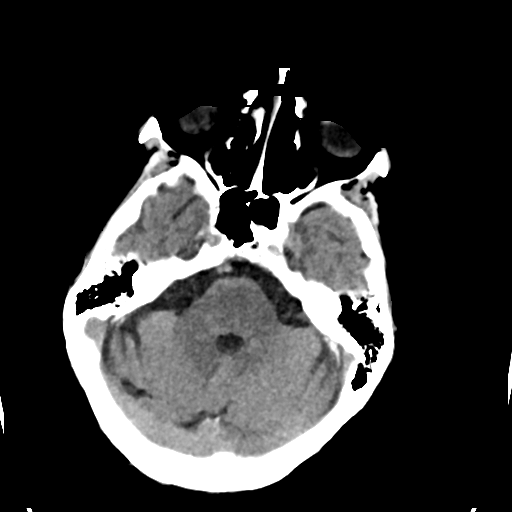
[im 13/33  brain]
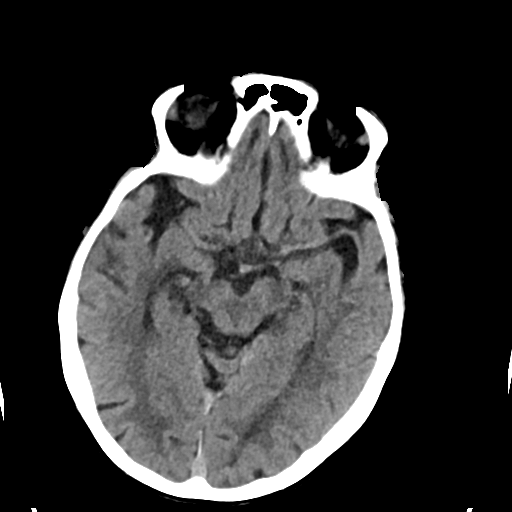
[im 17/33  brain]
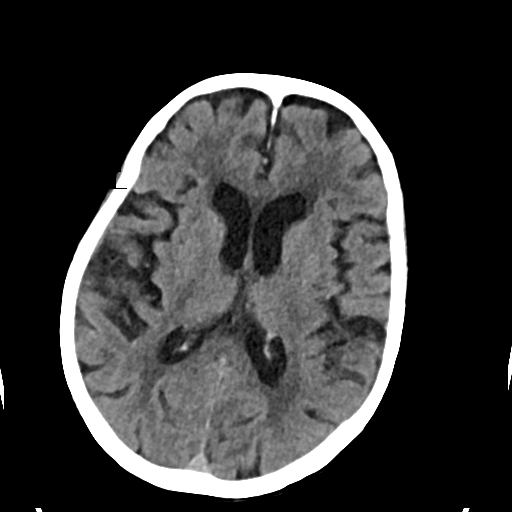
[im 21/33  brain]
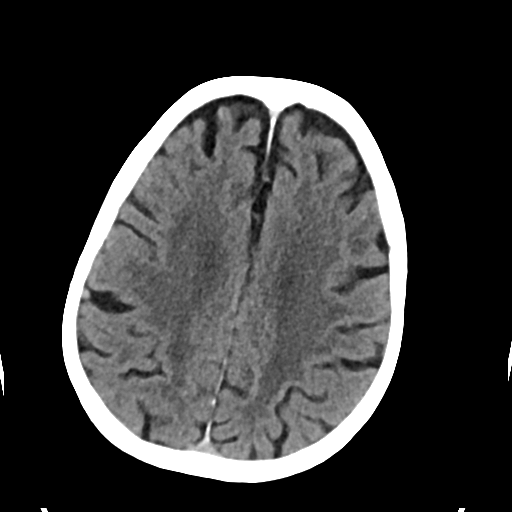
[im 21/33  bone]
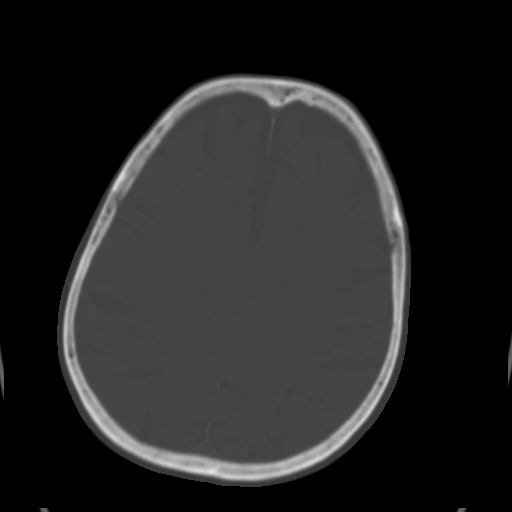
[im 25/33  brain]
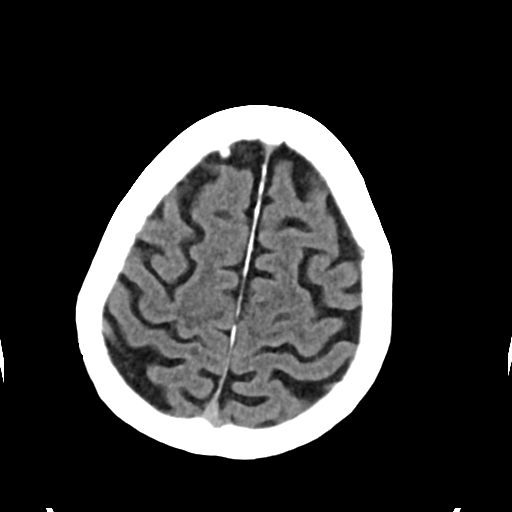
[im 29/33  brain]
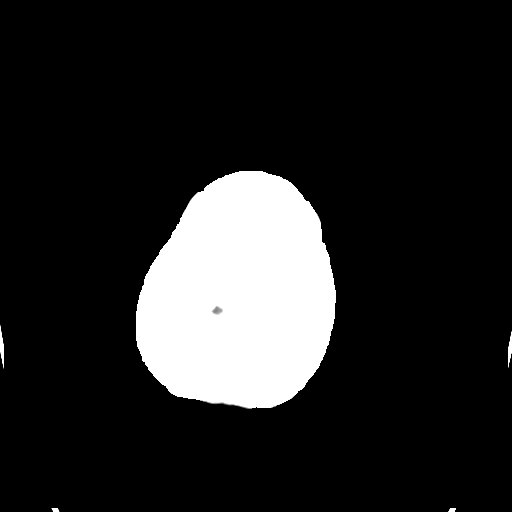

[Series 4: head bone · axial · 0.42mm/px · z∈[+1192,+1224]mm · 3 of 82 slices shown]
[im 9/82  bone]
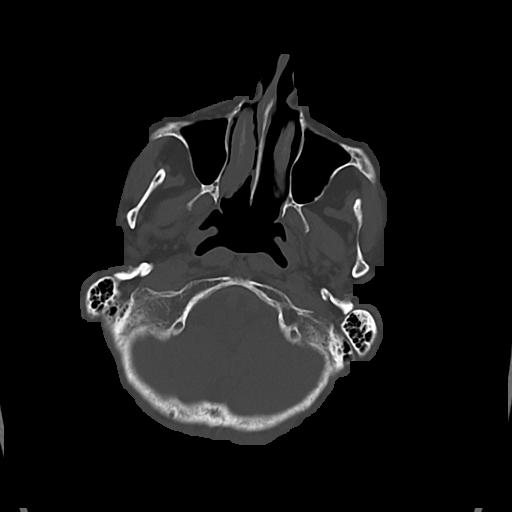
[im 17/82  bone]
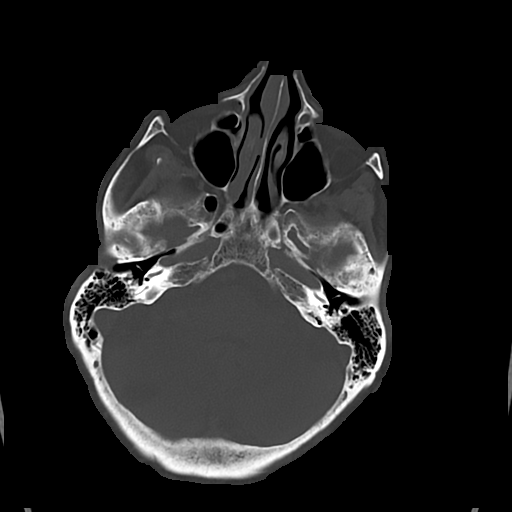
[im 25/82  bone]
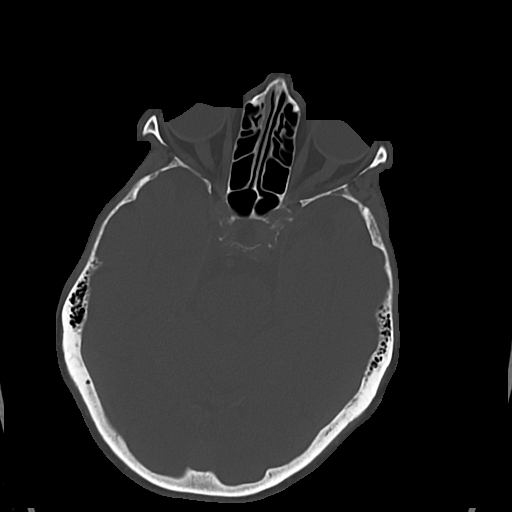

[Series 5: cor soft · coronal · 0.33mm/px · 3 of 65 slices shown]
[im 22/65  brain]
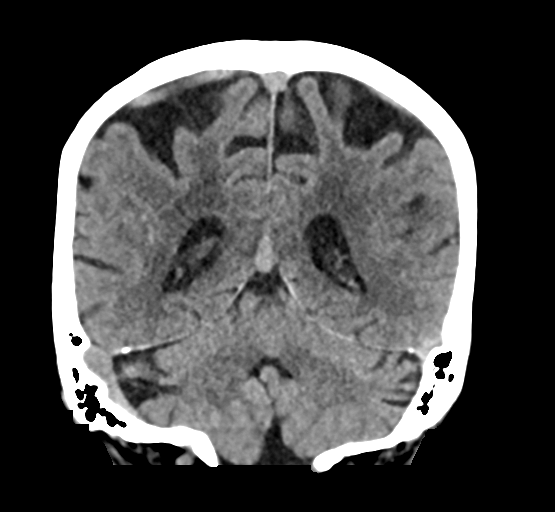
[im 29/65  brain]
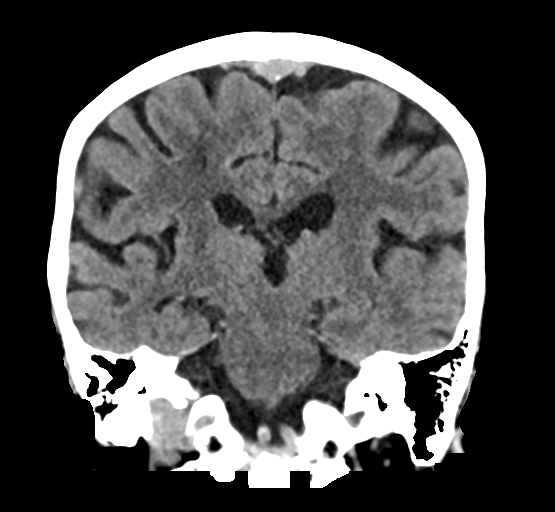
[im 36/65  brain]
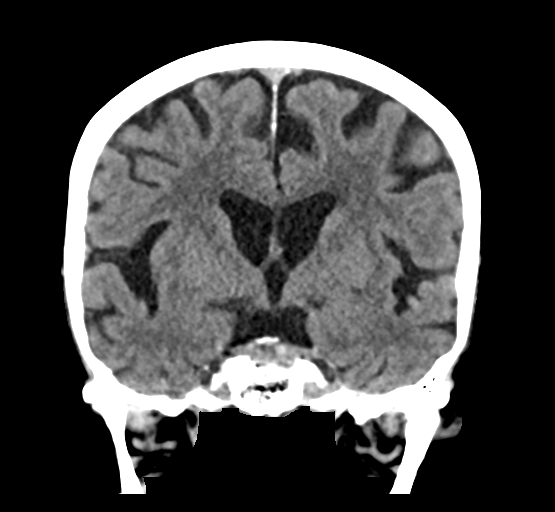

[Series 6: sag soft · sagittal · 0.30mm/px · 3 of 51 slices shown]
[im 17/51  brain]
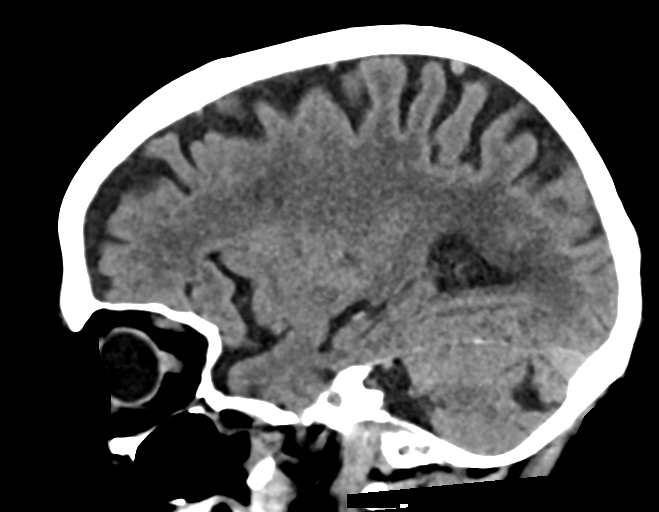
[im 26/51  brain]
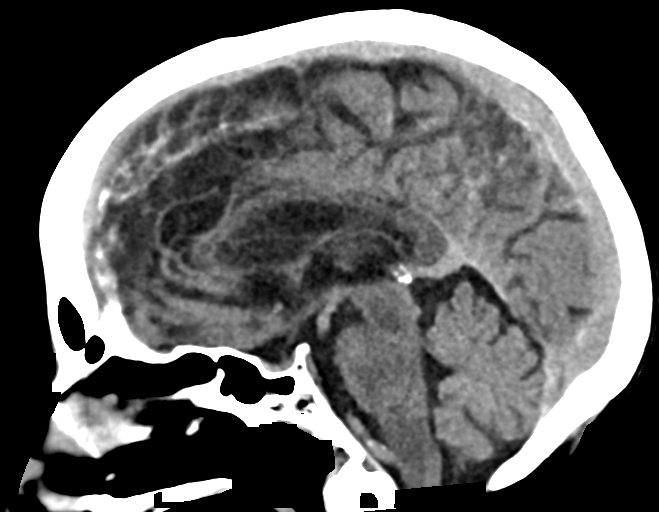
[im 34/51  brain]
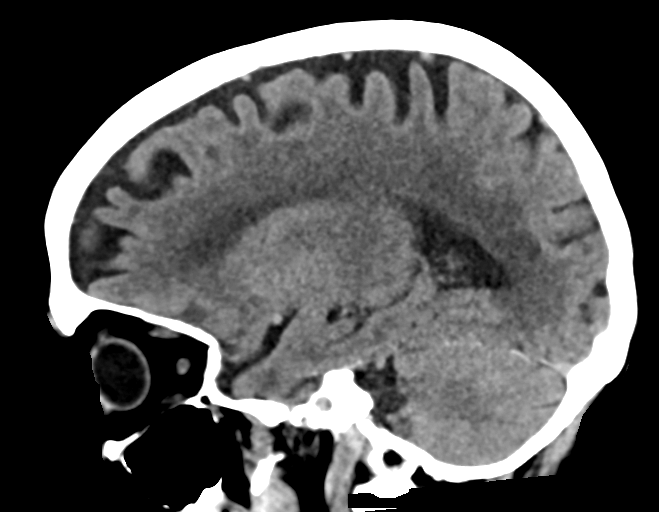

[16 of 47 positions shown; findings below may reference images not displayed]

FINDINGS: Brain: Remote high right frontal subcortical white matter and right
cerebellar infarcts are again noted. Remote lacunar infarct within
the right basal ganglia again noted. Mild to moderate parenchymal
volume loss is commensurate with the patient's age. Moderate
periventricular white matter changes are present likely reflecting
the sequela of small vessel ischemia.

No evidence of acute intracranial hemorrhage or infarct. No abnormal
mass effect or midline shift. No abnormal intra or extra-axial mass
lesion or fluid collection. Ventricular size is normal. Cerebellum
is otherwise unremarkable.

Vascular: No asymmetric hyperdense vasculature at the skull base.

Skull: The calvarium is intact.

Sinuses/Orbits: Minimal mucosal thickening in dependently layering
mucus or fluid within the left sphenoid sinus. Remaining paranasal
sinuses are clear. The orbits are unremarkable.

Other: Mastoid air cells and middle ear cavities are clear
IMPRESSION: Multiple remote infarcts. No evidence of acute intracranial
hemorrhage or infarct.

## 2021-04-28 IMAGING — DX DG CHEST 1V
1 series · 1 of 1 positions shown · non-contrast
Comparison: 05/12/2020

CLINICAL DATA: Altered mental status

EXAM:
CHEST  1 VIEW

[chest ap]
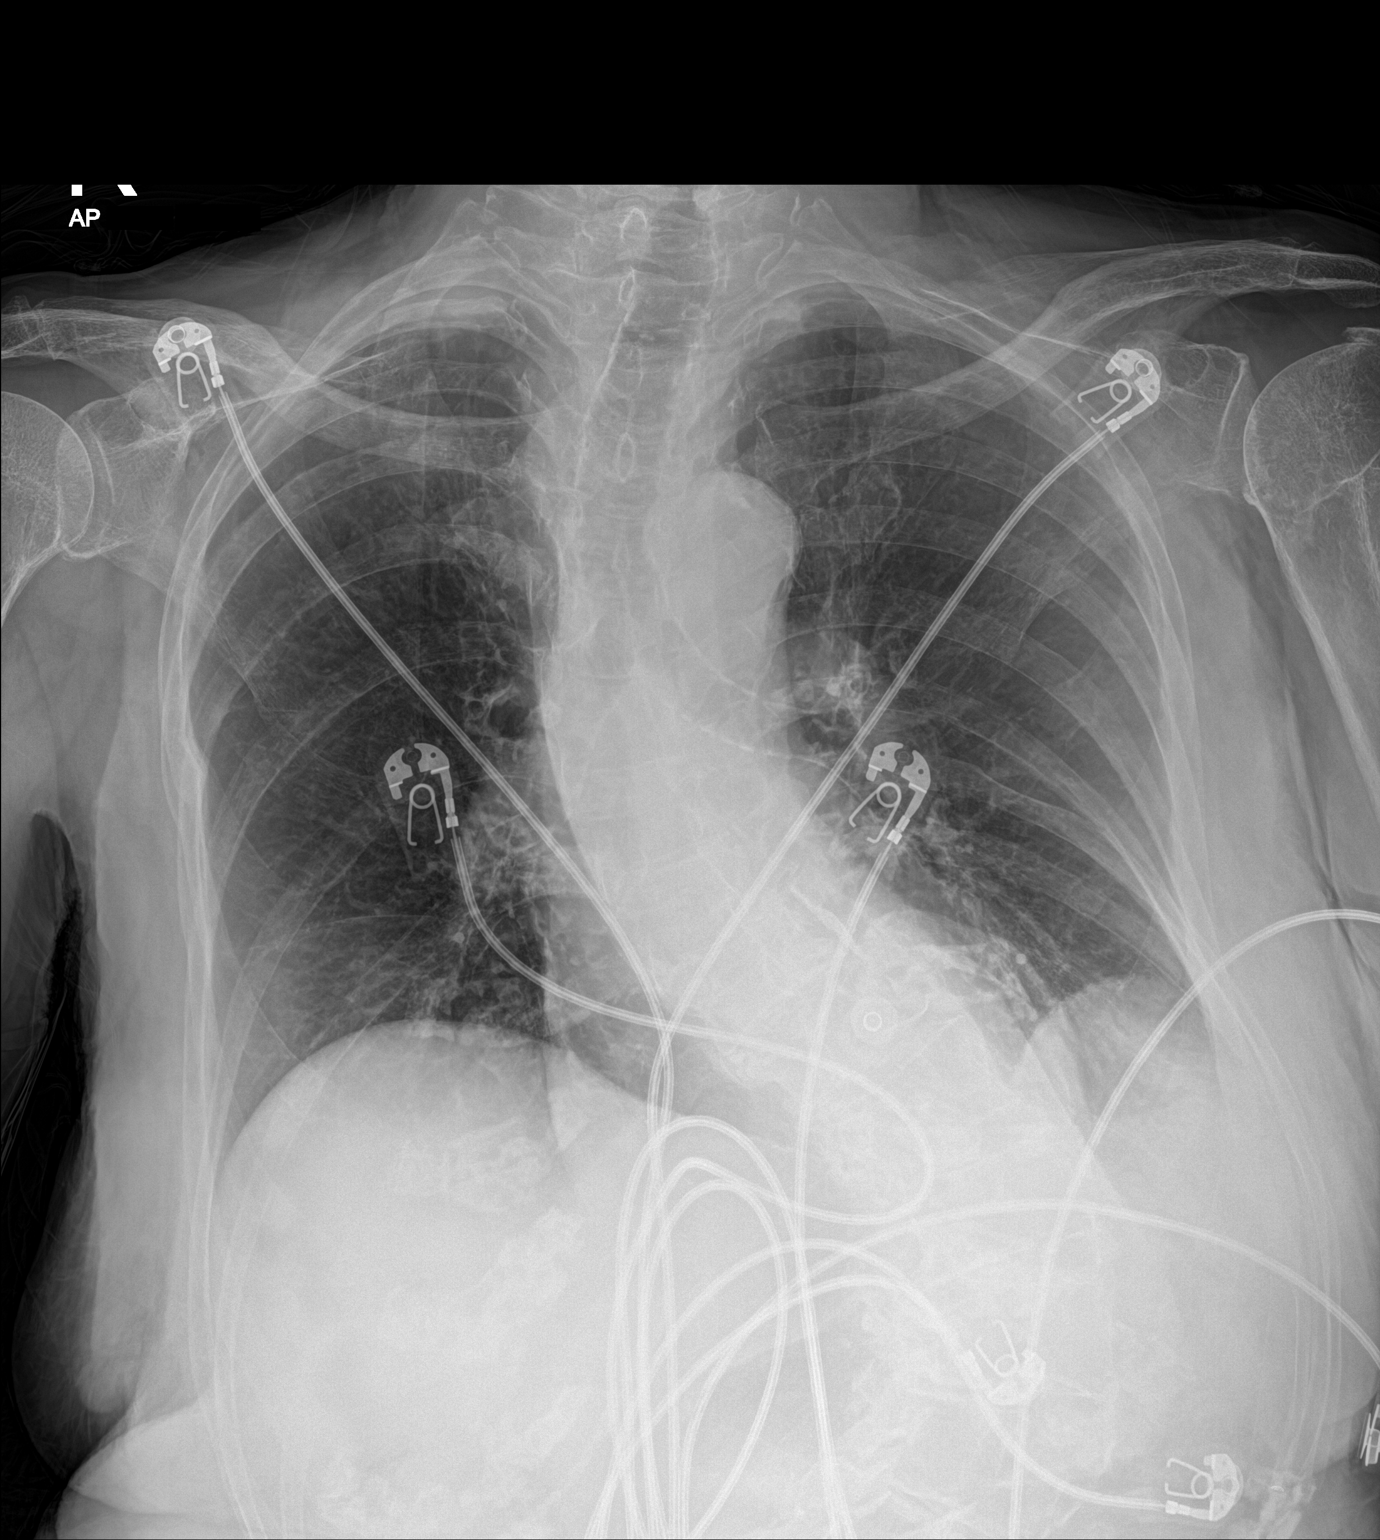

[1 of 1 positions shown; findings below may reference images not displayed]

FINDINGS: Lungs are clear. No pneumothorax or pleural effusion. Cardiac size
within normal limits. The pulmonary vascularity is normal. Moderate
to severe thoracolumbar sigmoid scoliosis is again noted.
IMPRESSION: No active disease.

## 2021-04-30 IMAGING — MR MR HEAD W/O CM
12 of 13 series · 43 of 48 positions shown · non-contrast
Comparison: 08/29/2020 head CT and prior. 07/29/2019 MRA head,
07/27/2019 MRI head and prior.

CLINICAL DATA: Delirium

EXAM:
MRI HEAD WITHOUT CONTRAST
TECHNIQUE: Multiplanar, multiecho pulse sequences of the brain and surrounding
structures were obtained without intravenous contrast.

[Series 5: DWI · axial · 3.0mm · 0.88mm/px · z∈[-93,+50]mm · 7 of 100 slices shown (1 of 4)]
[im 1/100]
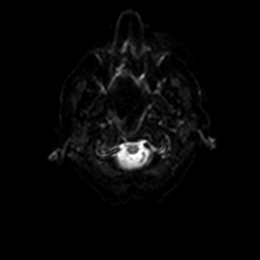
[im 17/100]
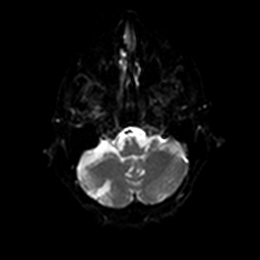
[im 34/100]
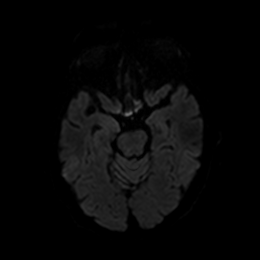
[im 50/100]
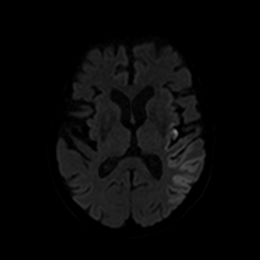
[im 67/100]
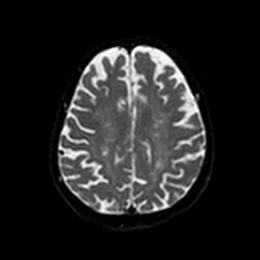
[im 83/100]
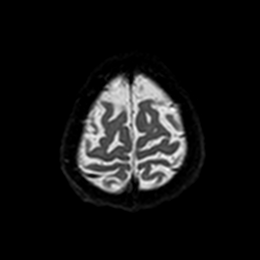
[im 100/100]
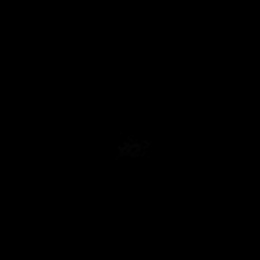

[Series 6: DWI · axial · 3.0mm · 0.88mm/px · z∈[-93,+50]mm · 3 of 50 slices shown (2 of 4)]
[im 1/50]
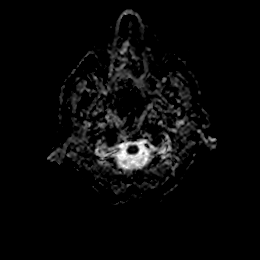
[im 25/50]
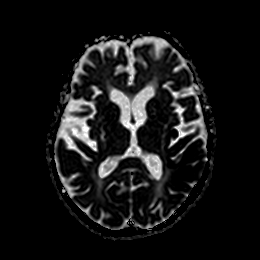
[im 50/50]
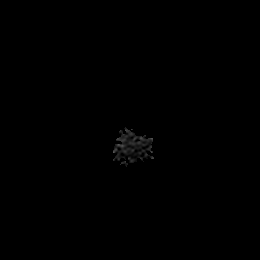

[Series 7: DWI · coronal · 4.0mm · 0.88mm/px · 5 of 64 slices shown (3 of 4)]
[im 1/64]
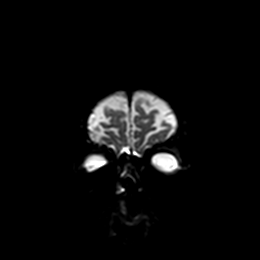
[im 16/64]
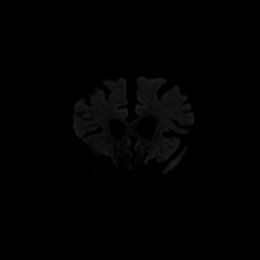
[im 32/64]
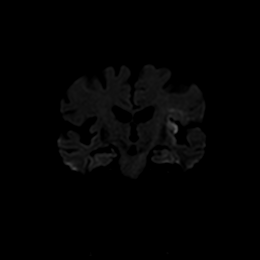
[im 48/64]
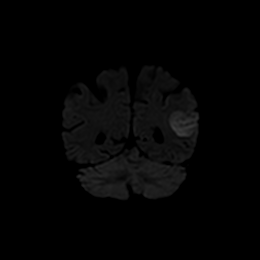
[im 64/64]
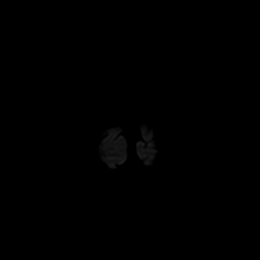

[Series 8: DWI · coronal · 4.0mm · 0.88mm/px · 2 of 31 slices shown (4 of 4)]
[im 1/31]
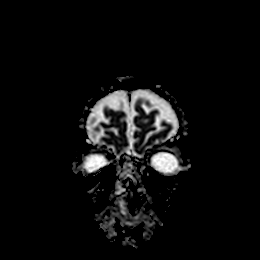
[im 31/31]
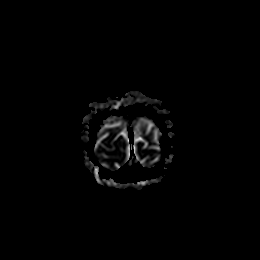

[Series 9: T1 · sagittal · 5.0mm · 0.75mm/px · 2 of 23 slices shown]
[im 1/23]
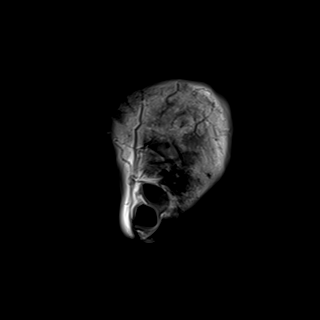
[im 23/23]
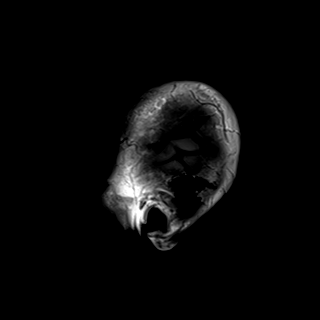

[Series 10: T2 · axial · 5.0mm · 0.72mm/px · z∈[-91,+49]mm · 2 of 25 slices shown (1 of 2)]
[im 1/25]
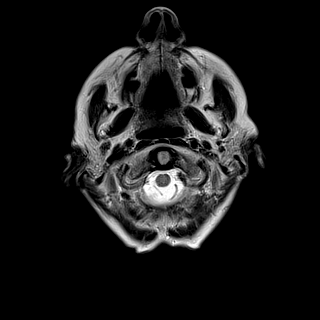
[im 25/25]
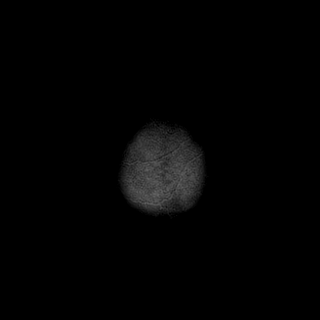

[Series 11: FLAIR · axial · 5.0mm · 0.45mm/px · z∈[-91,+48]mm · 2 of 25 slices shown]
[im 1/25]
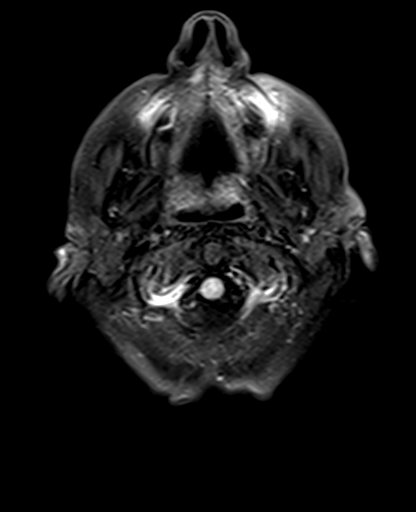
[im 25/25]
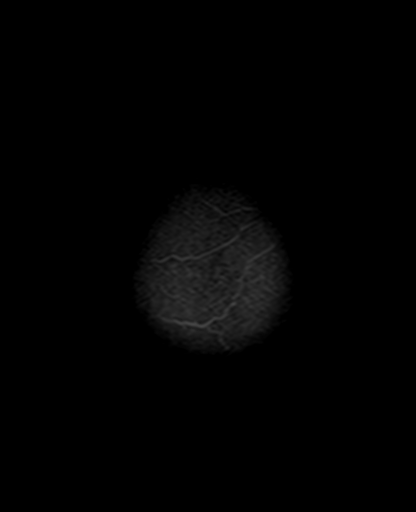

[Series 12: mag_images · axial · 3.0mm · 0.90mm/px · z∈[-107,+64]mm · 5 of 60 slices shown]
[im 1/60]
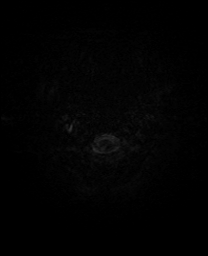
[im 15/60]
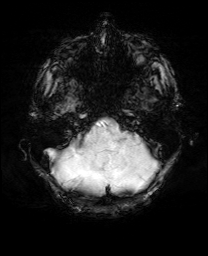
[im 30/60]
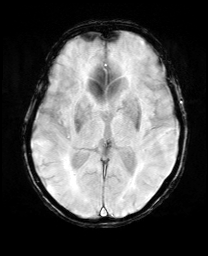
[im 45/60]
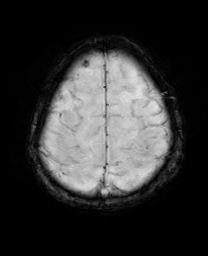
[im 60/60]
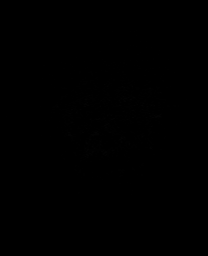

[Series 13: pha_images · axial · 3.0mm · 0.90mm/px · z∈[-107,+58]mm · 4 of 57 slices shown]
[im 1/57]
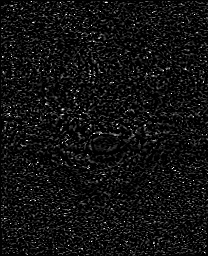
[im 19/57]
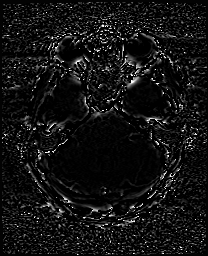
[im 38/57]
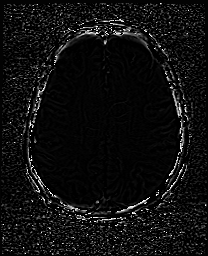
[im 57/57]
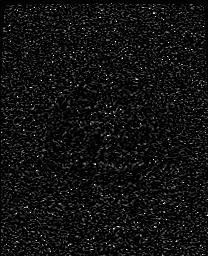

[Series 14: swi_images · axial · 3.0mm · 0.90mm/px · z∈[-107,+64]mm · 5 of 60 slices shown]
[im 1/60]
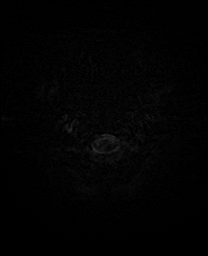
[im 15/60]
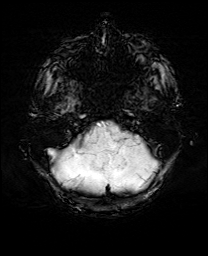
[im 30/60]
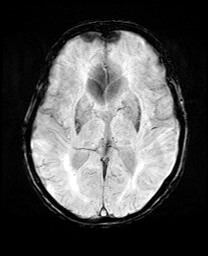
[im 45/60]
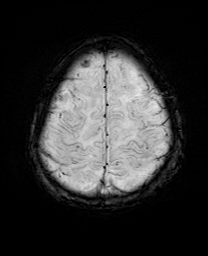
[im 60/60]
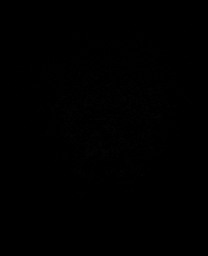

[Series 15: mip_images(sw) · axial · 24.0mm · 0.90mm/px · z∈[-97,+54]mm · 4 of 53 slices shown]
[im 1/53]
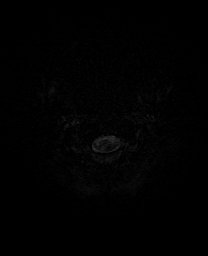
[im 18/53]
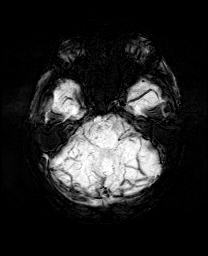
[im 35/53]
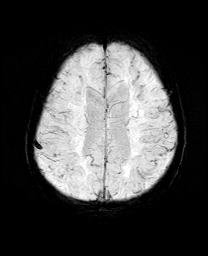
[im 53/53]
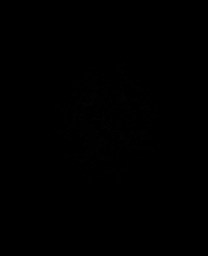

[Series 17: T2 · coronal · 5.0mm · 0.34mm/px · 2 of 29 slices shown (2 of 2)]
[im 1/29]
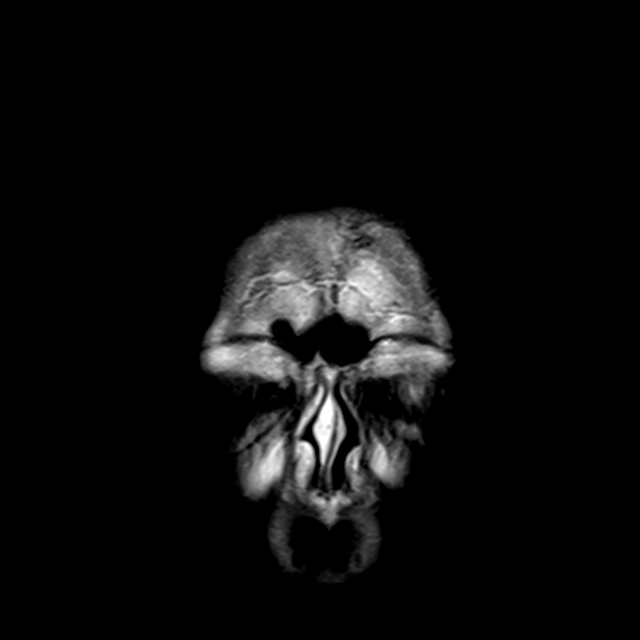
[im 29/29]
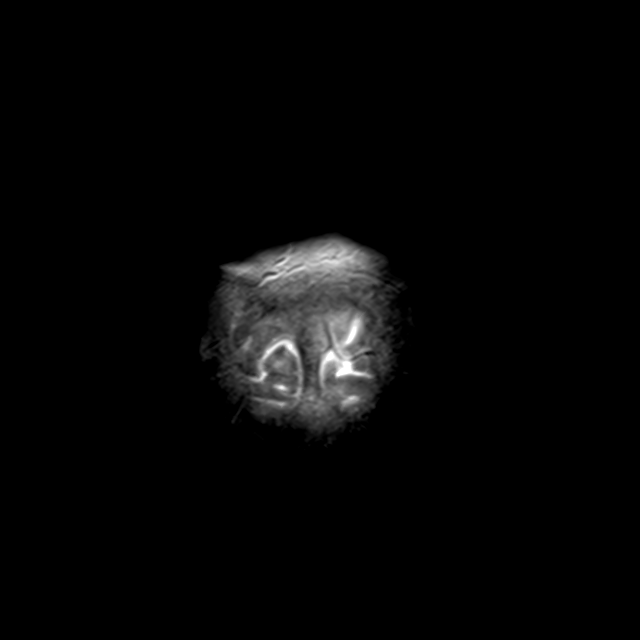

[43 of 48 positions shown; findings below may reference images not displayed]

FINDINGS: Brain: Focal restricted diffusion involving the dorsal left frontal,
insula, parietal and occipitotemporal regions. No intracranial
hemorrhage. No midline shift, ventriculomegaly or extra-axial fluid
collection. Advanced chronic microvascular ischemic changes. No mass
lesion.

Sequela of remote right centrum semiovale, right cerebellar and
bilateral basal ganglia insults. Mild diffuse cerebral atrophy with
ex vacuo dilatation.

Vascular: T2/FLAIR hyperintense signal involving the distal left
transverse and sigmoid sinuses ([DATE]). Major intracranial flow voids
are otherwise preserved.

Skull and upper cervical spine: Normal marrow signal.

Sinuses/Orbits: Normal orbits. Clear paranasal sinuses. No mastoid
effusion.

Other: None.
IMPRESSION: Acute infarcts involving the left frontoparietal, insular and
occipitotemporal regions.

FLAIR hyperintense signal within the left transverse/sigmoid sinus.
While this may reflect slow flow consider MRA/MRV imaging to exclude
venous sinus thrombosis.

Remote right centrum semiovale, right cerebellar and bilateral basal
ganglia insults.

Mild cerebral atrophy.  Chronic microvascular ischemic changes.

These results were called by telephone at the time of interpretation
on 08/31/2020 at [DATE] to provider LEZH RECI , who verbally
acknowledged these results.

## 2021-04-30 IMAGING — DX DG HIP (WITH OR WITHOUT PELVIS) 2-3V*R*
1 series · 2 of 2 positions shown · non-contrast
Comparison: 05/12/2020.

CLINICAL DATA: Right hip pain.  Question dislocation.

EXAM:
DG HIP (WITH OR WITHOUT PELVIS) 2-3V RIGHT

[Series 2: hip · 0.14mm/px · 2 of 2 slices shown]
[im 1/2]
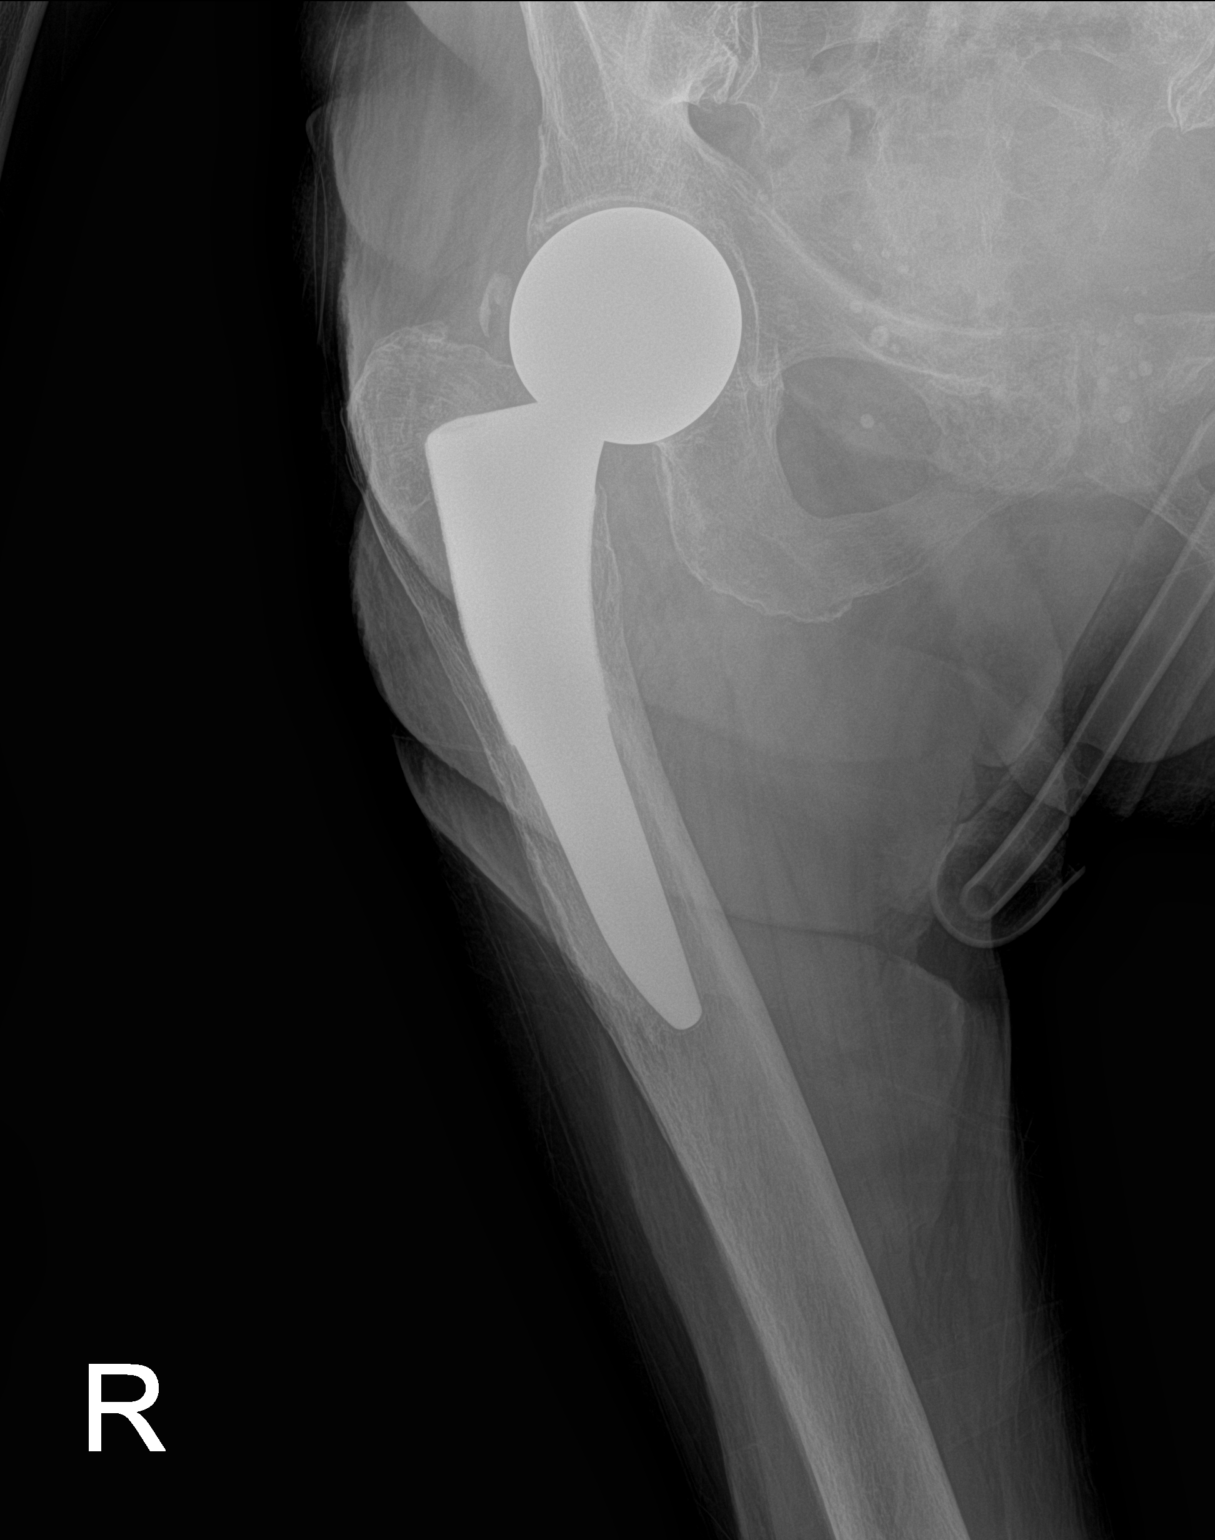
[im 2/2]
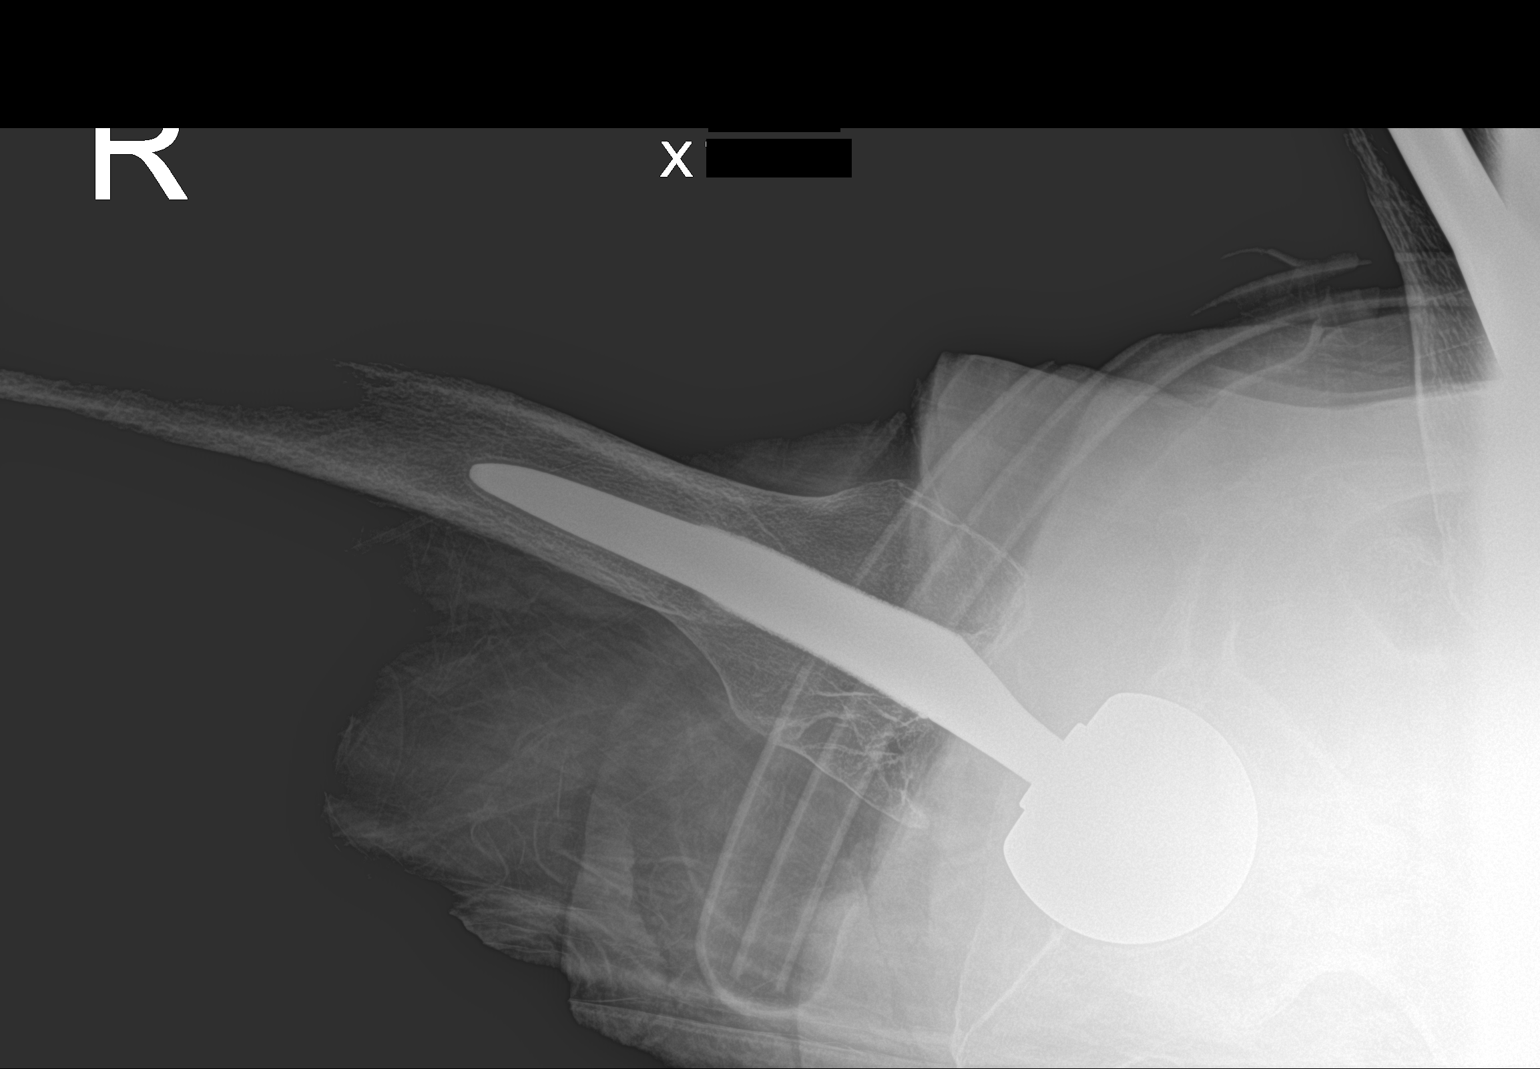

[2 of 2 positions shown; findings below may reference images not displayed]

FINDINGS: Previous bipolar hip replacement on the right. No complicating
feature seen. No dislocation.
IMPRESSION: Good appearance of the previous bipolar hip replacement on the
right.

## 2021-04-30 IMAGING — DX DG PORTABLE PELVIS
1 series · 1 of 1 positions shown · non-contrast
Comparison: 05/12/2020

CLINICAL DATA: Pain.  Question dislocation.

EXAM:
PORTABLE PELVIS 1-2 VIEWS

[pelvis]
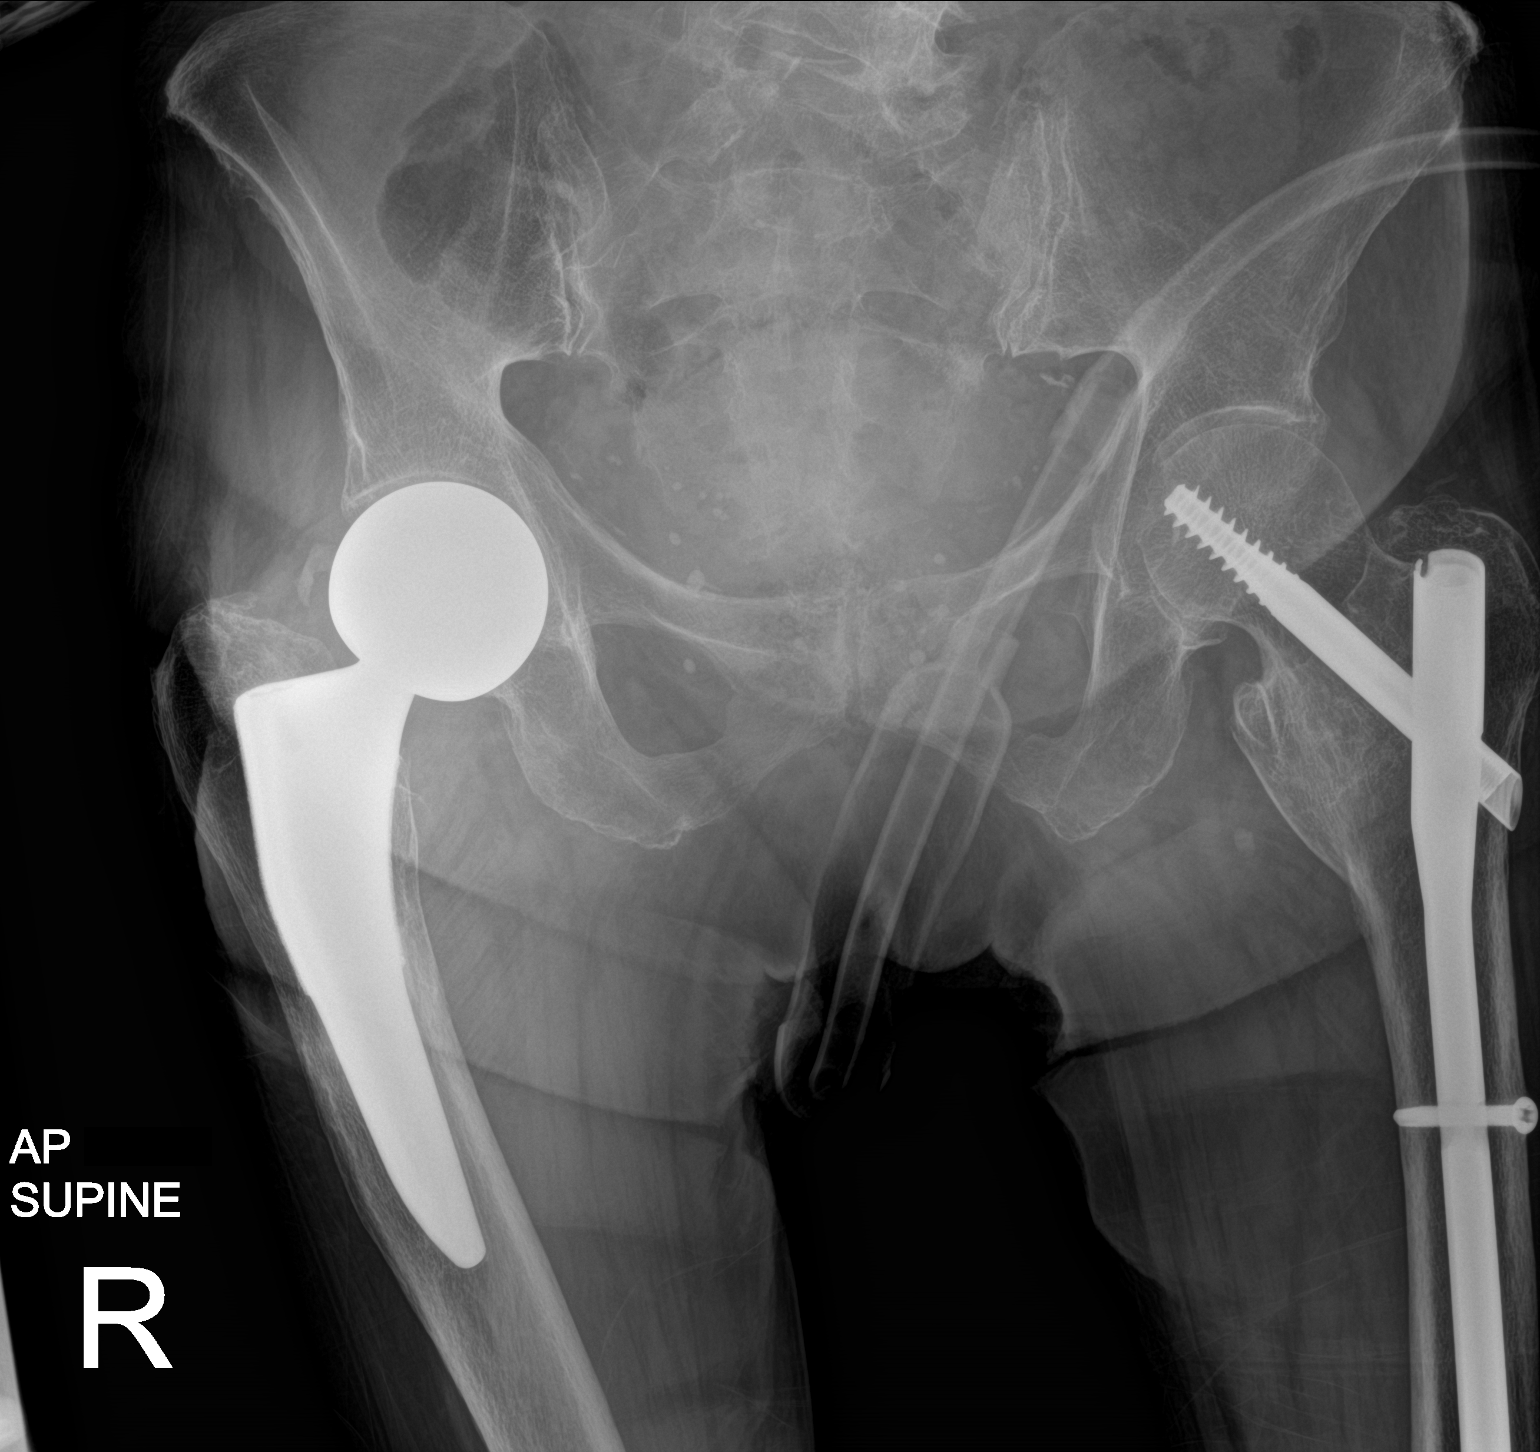

[1 of 1 positions shown; findings below may reference images not displayed]

FINDINGS: Old bipolar hip replacement on the right appears unremarkable.
Previous gamma nail fixation of the left femur with solid union. No
acute or significant focal pelvic finding.
IMPRESSION: No acute finding. Old bipolar hip replacement on the right.

## 2021-06-09 IMAGING — CT CT HEAD W/O CM
3 series · 15 of 47 positions shown, 18 images · non-contrast
Comparison: MR head 08/31/2020, CT head 08/29/2020, CT head
05/12/2020.

CLINICAL DATA: Neuro deficit, acute, stroke suspected. Less
responsive.

EXAM:
CT HEAD WITHOUT CONTRAST
TECHNIQUE: Contiguous axial images were obtained from the base of the skull
through the vertex without intravenous contrast.

[Series 2: head w o · axial · 0.43mm/px · z∈[+262,+387]mm · 9 of 31 slices shown, 12 images]
[im 3/31  brain]
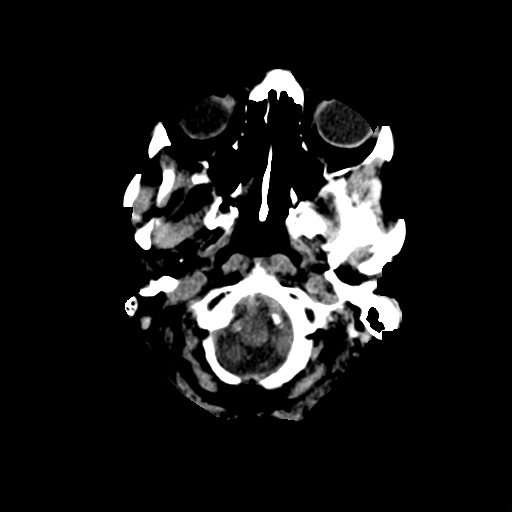
[im 3/31  bone]
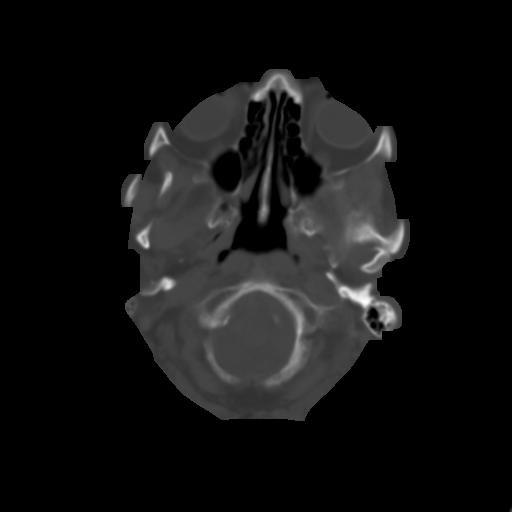
[im 6/31  brain]
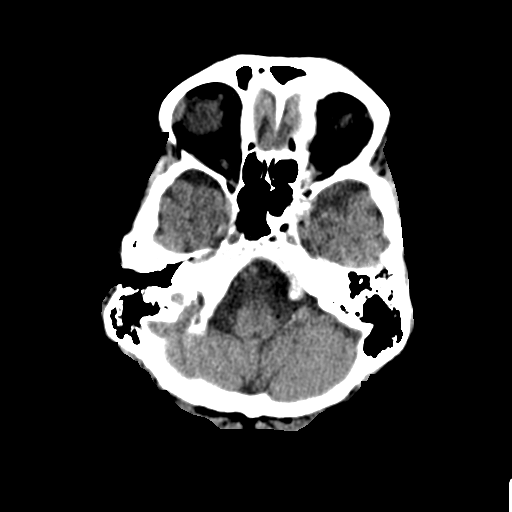
[im 9/31  brain]
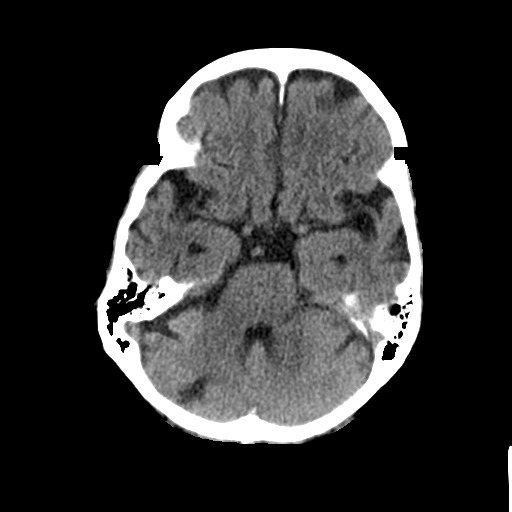
[im 12/31  brain]
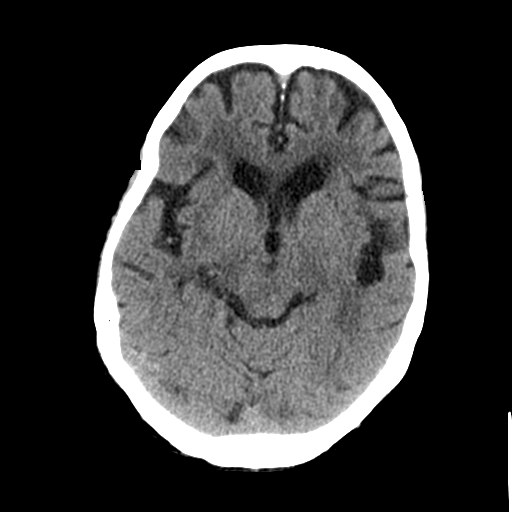
[im 16/31  brain]
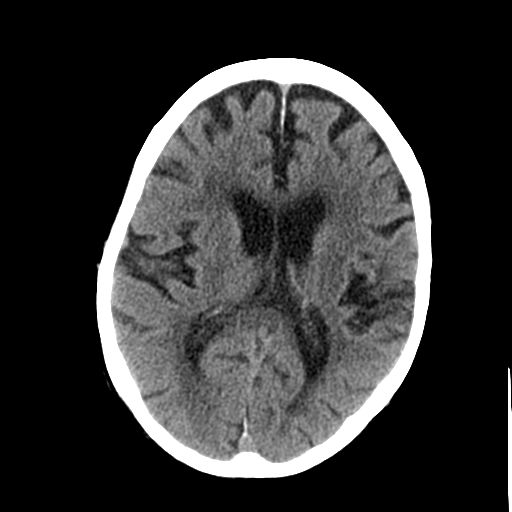
[im 16/31  bone]
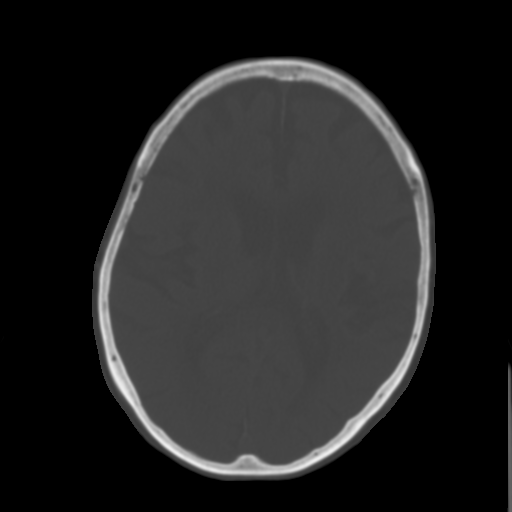
[im 19/31  brain]
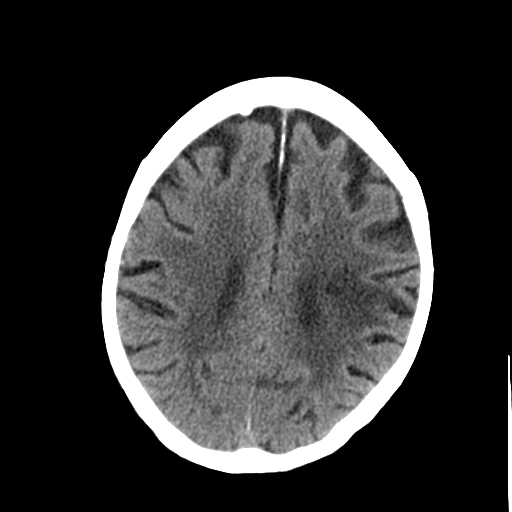
[im 22/31  brain]
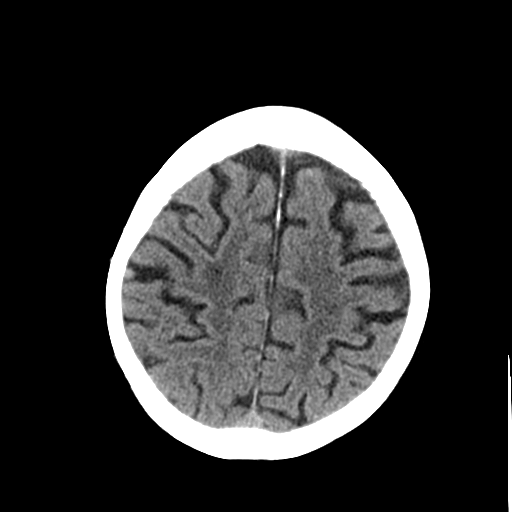
[im 25/31  brain]
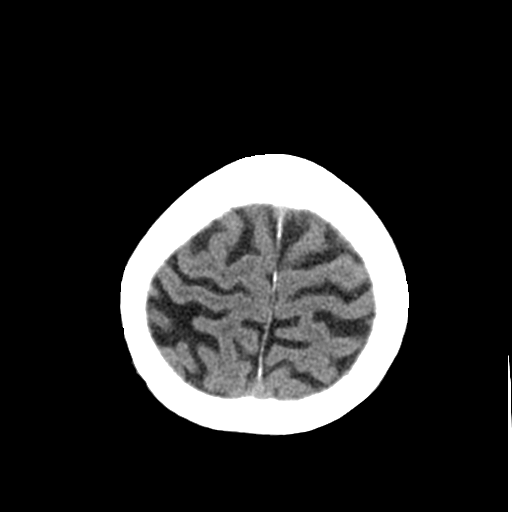
[im 28/31  brain]
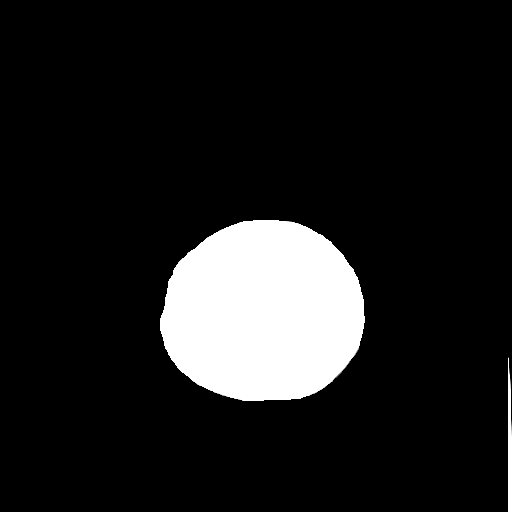
[im 28/31  bone]
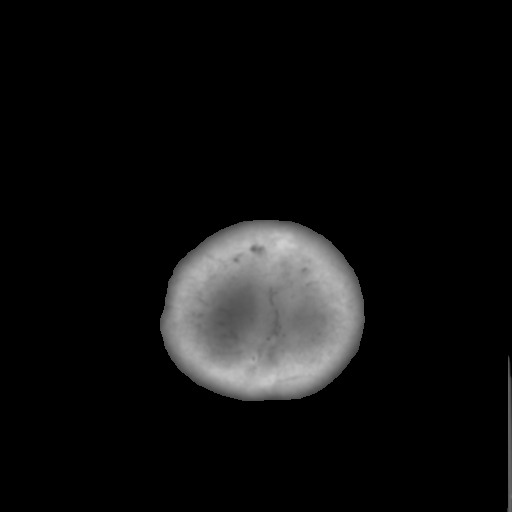

[Series 4: coronal soft · coronal · 0.30mm/px · 3 of 67 slices shown]
[im 23/67  brain]
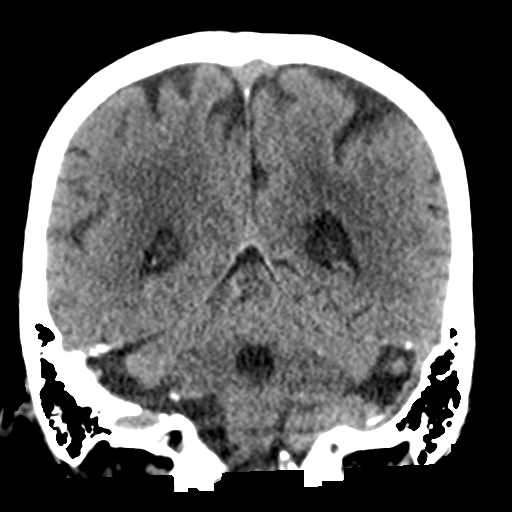
[im 30/67  brain]
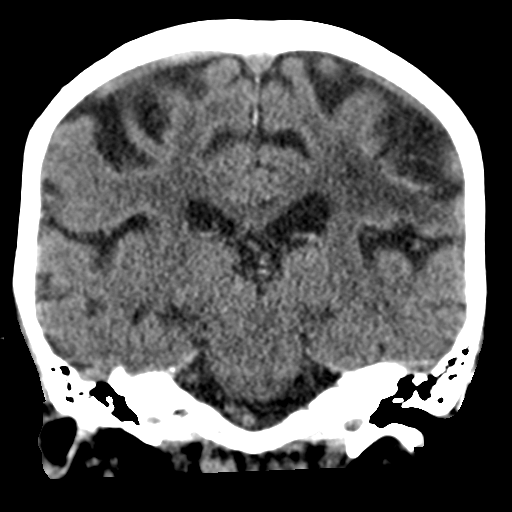
[im 37/67  brain]
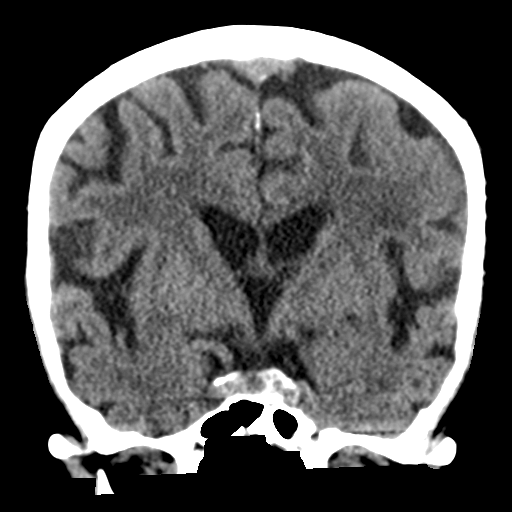

[Series 5: sagittal soft · sagittal · 0.30mm/px · 3 of 52 slices shown]
[im 18/52  brain]
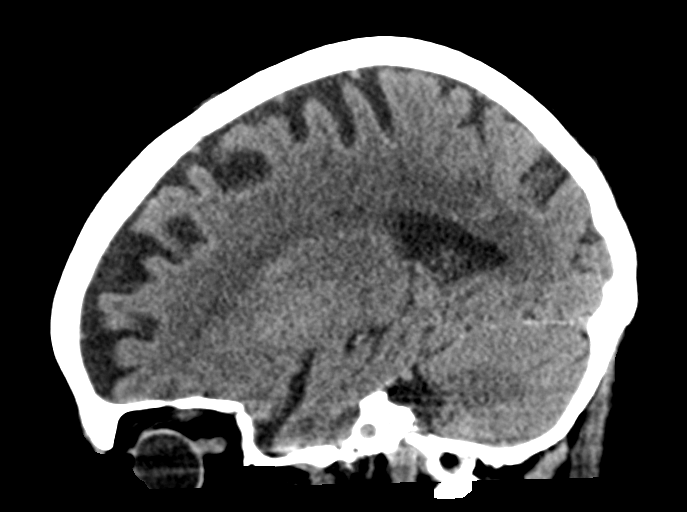
[im 26/52  brain]
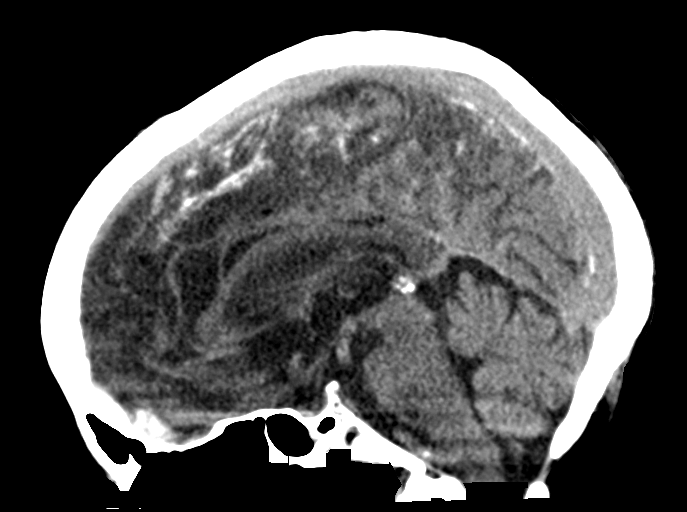
[im 35/52  brain]
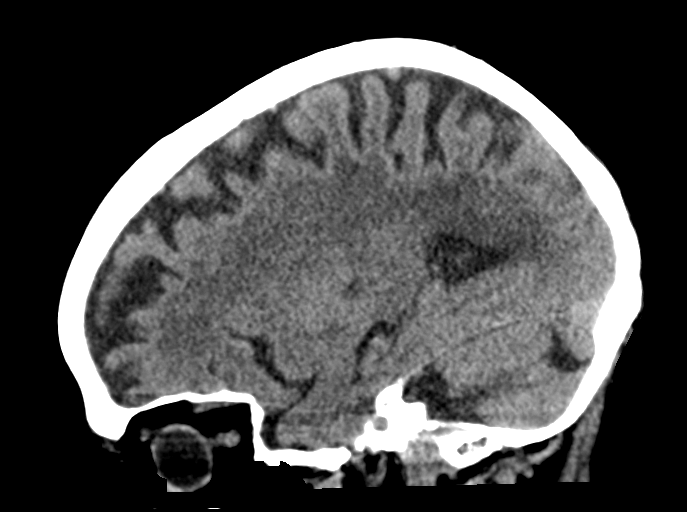

[15 of 47 positions shown; findings below may reference images not displayed]

FINDINGS: Brain:

Chronic right cerebellar infarction. Left frontoparietal white
matter hypodensity consistent with interval evolution of recent
subacute/chronic infarction seen on MR head 08/31/2020. Interval
development of gray-white matter differentiation within the left
frontoparietal lobe may represent a superimposed acute component on
a subacute/chronic infarction ([DATE], [DATE]). Patchy and confluent
areas of decreased attenuation are noted throughout the deep and
periventricular white matter of the cerebral hemispheres
bilaterally, compatible with chronic microvascular ischemic disease.

No parenchymal hemorrhage. No mass lesion. No extra-axial
collection.

No mass effect or midline shift. No hydrocephalus. Basilar cisterns
are patent.

Vascular: No hyperdense vessel.

Skull: No acute fracture or focal lesion.

Sinuses/Orbits: Paranasal sinuses and mastoid air cells are clear.
The orbits are unremarkable.

Other: None.
IMPRESSION: 1. Possible acute on chronic left frontoparietal infarction.
Consider MRI noncontrast for further evaluation.
2. No intracranial hemorrhage.

These results were called by telephone at the time of interpretation
on 10/11/2020 at [DATE] to provider NYA JUMPER , who verbally
acknowledged these results.

## 2021-06-10 IMAGING — US US CAROTID DUPLEX BILAT
1 series · 13 of 24 positions shown · non-contrast
Comparison: Report from a prior study performed at [HOSPITAL] on
09/01/2020

CLINICAL DATA: Syncope and recent stroke.

EXAM:
BILATERAL CAROTID DUPLEX ULTRASOUND
TECHNIQUE: Gray scale imaging, color Doppler and duplex ultrasound were
performed of bilateral carotid and vertebral arteries in the neck.

[Series 1: us carotid duplex bilat · 0.05mm/px · 13 of 109 slices shown]
[im 1/109]
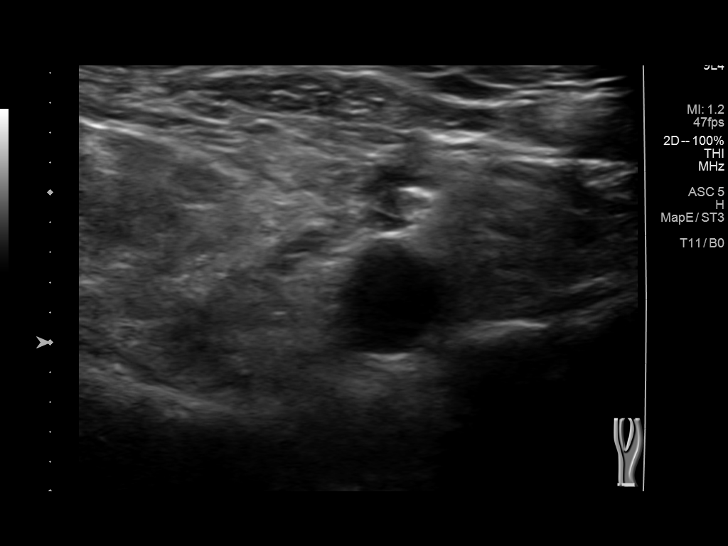
[im 10/109]
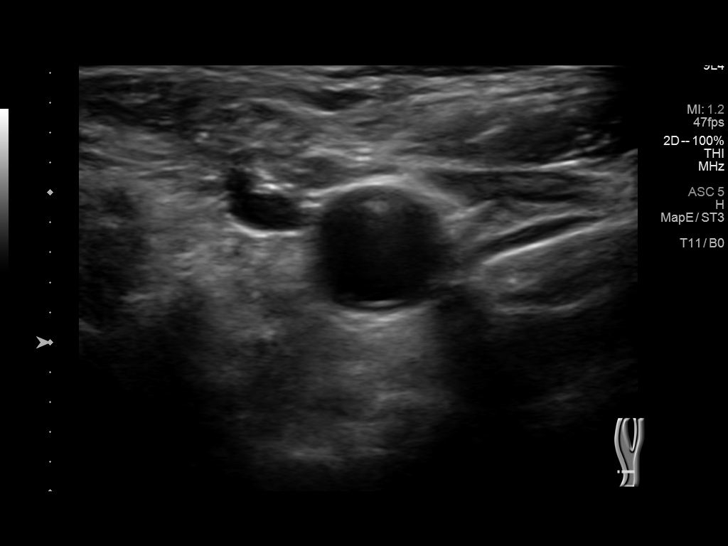
[im 19/109]
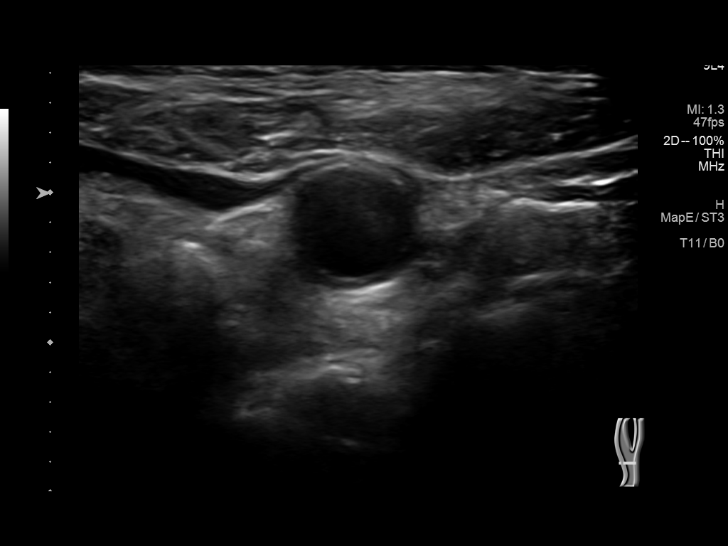
[im 29/109]
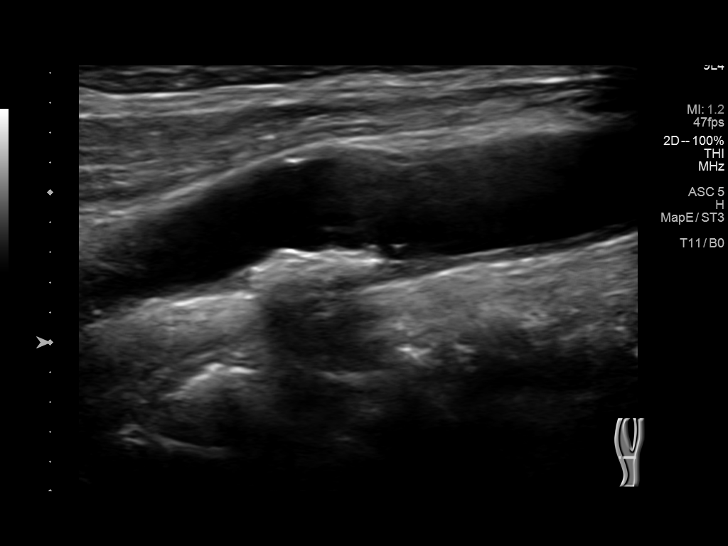
[im 38/109]
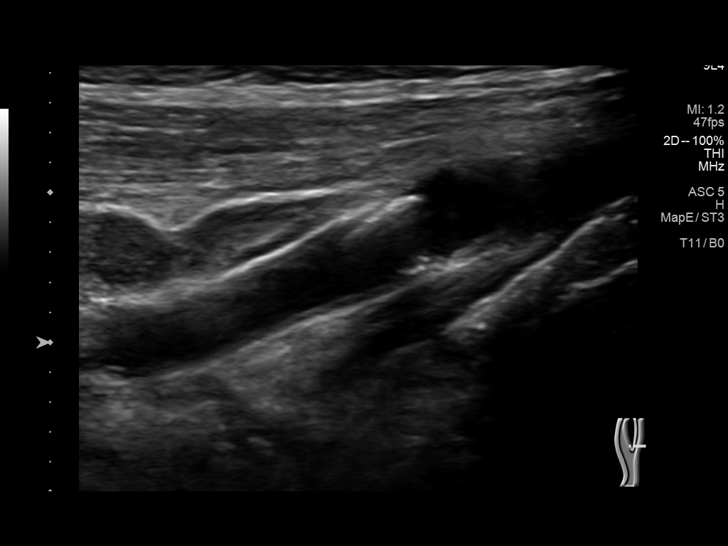
[im 47/109]
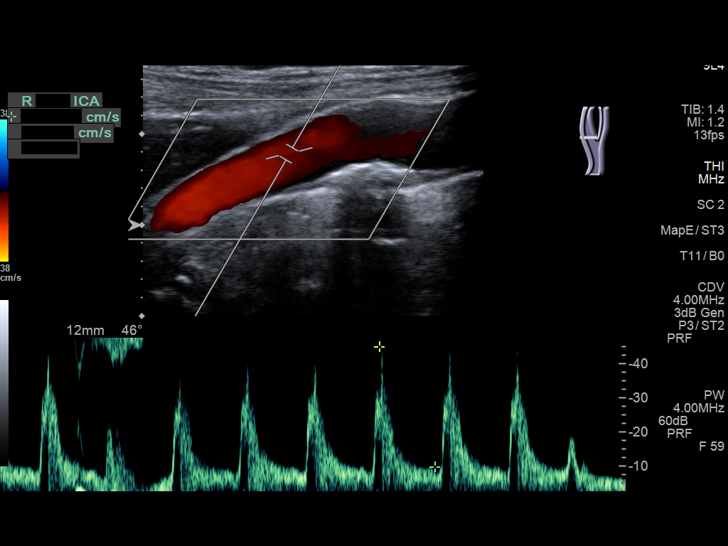
[im 57/109]
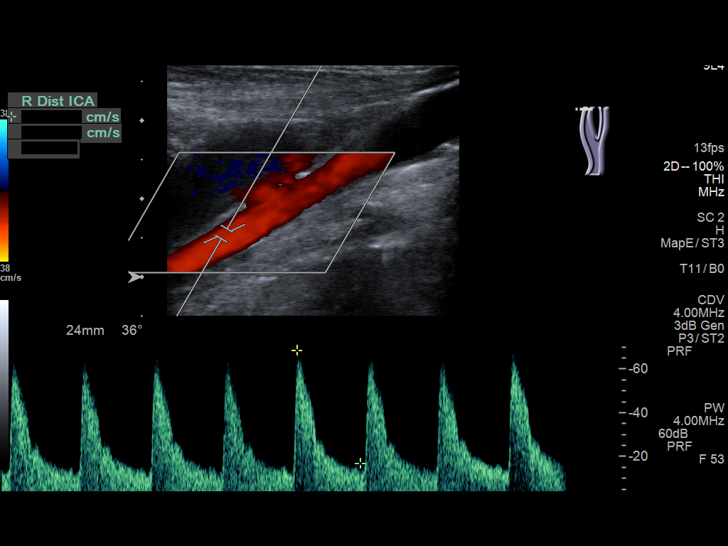
[im 62/109]
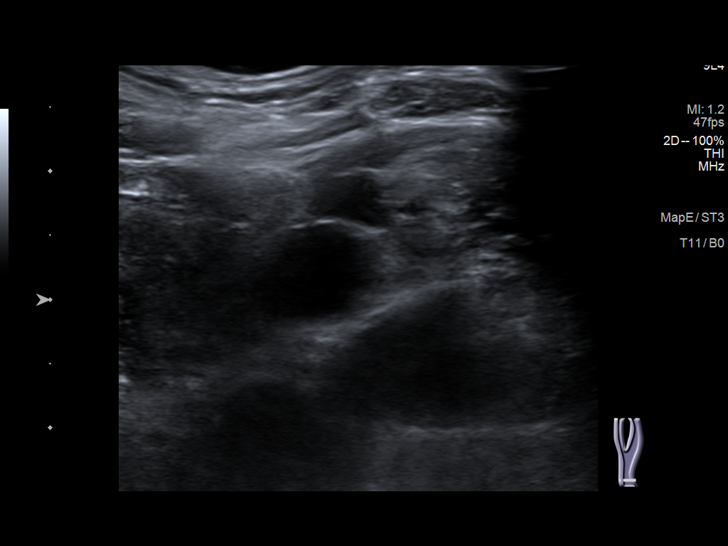
[im 71/109]
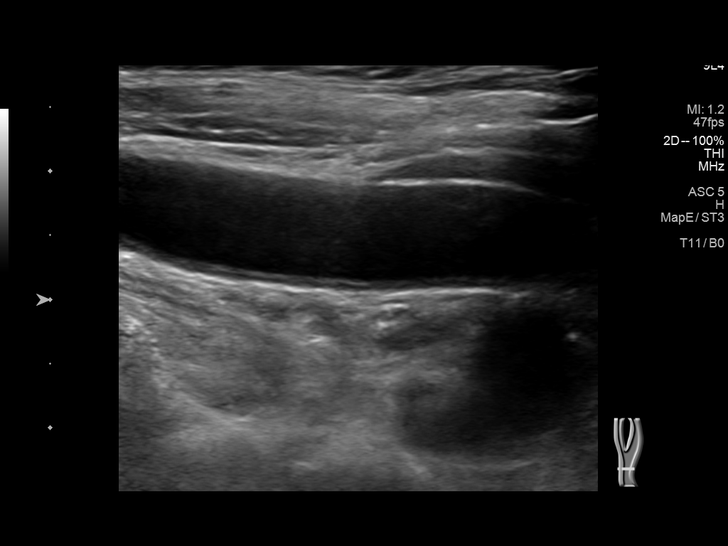
[im 80/109]
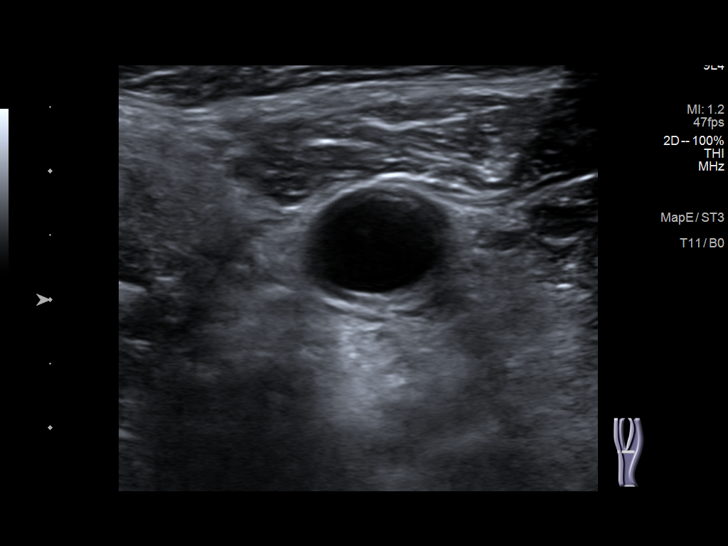
[im 90/109]
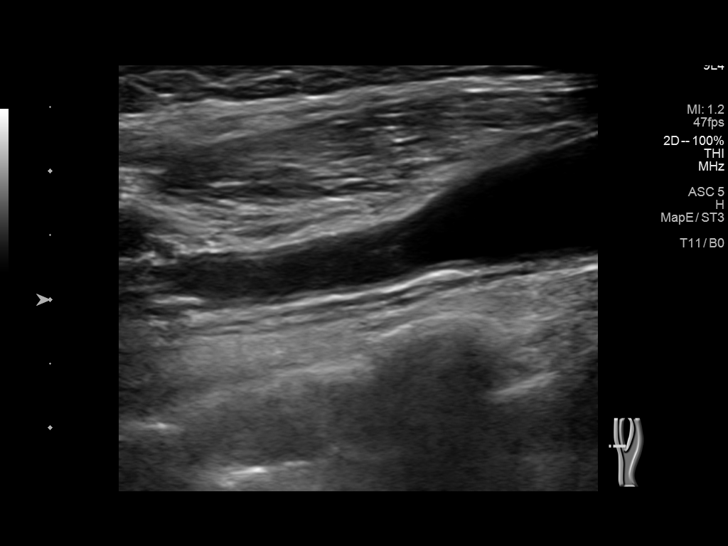
[im 99/109]
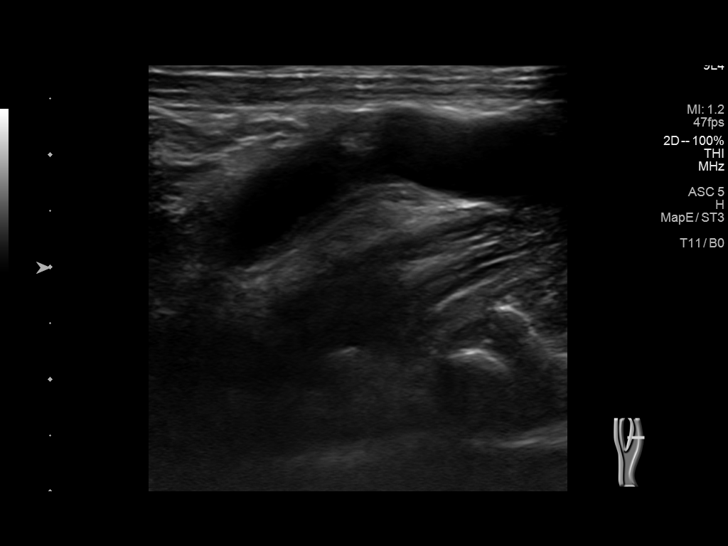
[im 109/109]
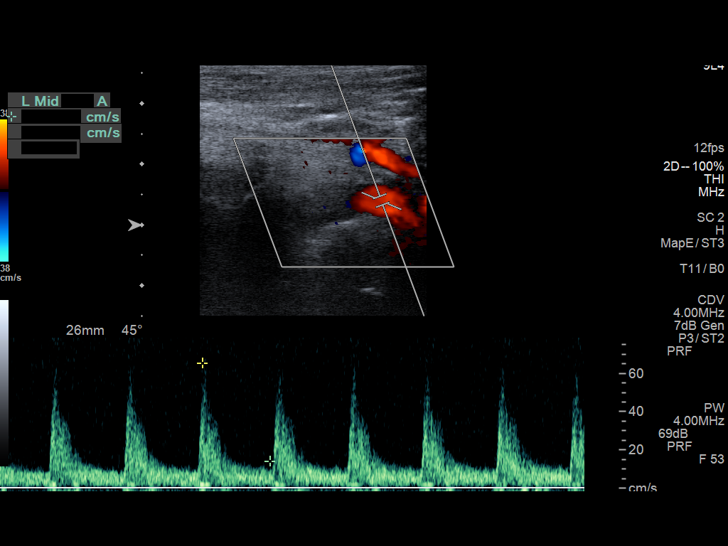

[13 of 24 positions shown; findings below may reference images not displayed]

FINDINGS: Criteria: Quantification of carotid stenosis is based on velocity
parameters that correlate the residual internal carotid diameter
with NASCET-based stenosis levels, using the diameter of the distal
internal carotid lumen as the denominator for stenosis measurement.

The following velocity measurements were obtained:

RIGHT

ICA:  69/17 cm/sec

CCA:  66/17 cm/sec

SYSTOLIC ICA/CCA RATIO:

ECA:  94 cm/sec

LEFT

ICA:  91/24 cm/sec

CCA:  63/14 cm/sec

SYSTOLIC ICA/CCA RATIO:

ECA:  78 cm/sec

RIGHT CAROTID ARTERY: Mild amount of partially calcified plaque is
present at the level of the carotid bulb and extending into the
proximal right ICA. Estimated right ICA stenosis is less than 50%.

RIGHT VERTEBRAL ARTERY: Antegrade flow with normal waveform and
velocity.

LEFT CAROTID ARTERY: There is a mild amount of partially calcified
plaque at the level of the left carotid bulb and proximal left ICA.
Estimated left ICA stenosis is less than 50%.

LEFT VERTEBRAL ARTERY: Antegrade flow with normal waveform and
velocity.
IMPRESSION: Mild amount of plaque at the level of bilateral carotid bulbs and
proximal internal carotid arteries. No significant carotid stenosis
identified with estimated bilateral ICA stenoses of less than 50%.

## 2021-06-10 IMAGING — MR MR MRA HEAD W/O CM
1 series · 16 of 48 positions shown · non-contrast
Comparison: Head CT 12/10/2020, head CT 10/10/2020, MRA head
08/31/2020, MRI head 08/31/2020.

CLINICAL DATA: Neuro deficit, acute, stroke suspected. Altered
mental status, possible stroke, history of stroke in [REDACTED].

EXAM:
MRI HEAD WITHOUT CONTRAST
MRA HEAD WITHOUT CONTRAST
TECHNIQUE: Multiplanar, multiecho pulse sequences of the brain and surrounding
structures were obtained without intravenous contrast. Angiographic
images of the head were obtained using MRA technique without
contrast.

[Series 100: TOF · axial · 0.4mm · 0.43mm/px · z∈[-83,+1]mm · 16 of 232 slices shown]
[im 1/232]
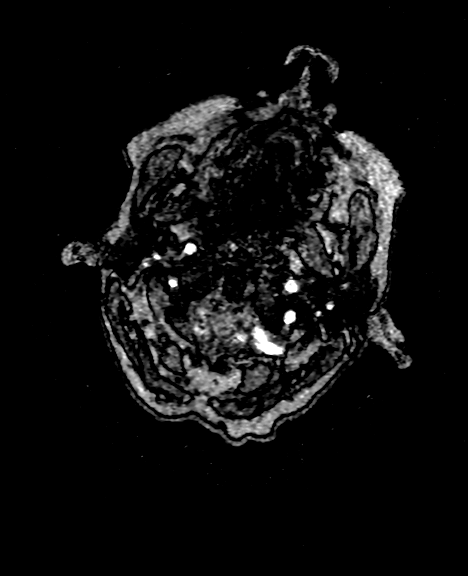
[im 5/232]
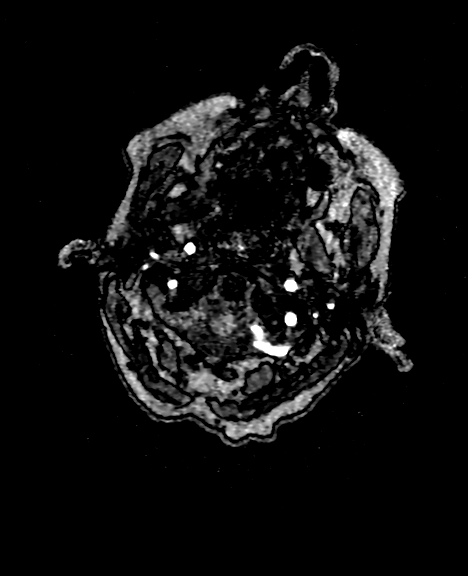
[im 10/232]
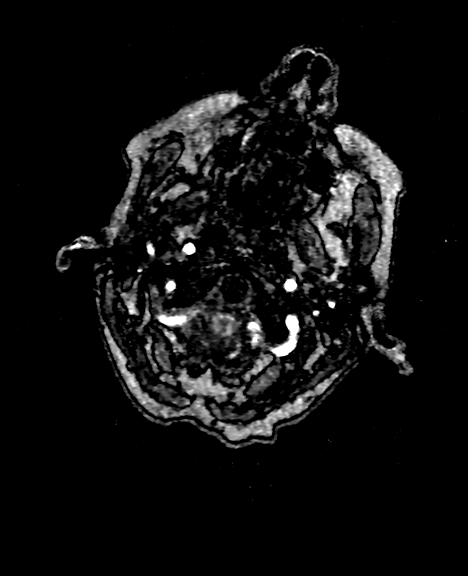
[im 15/232]
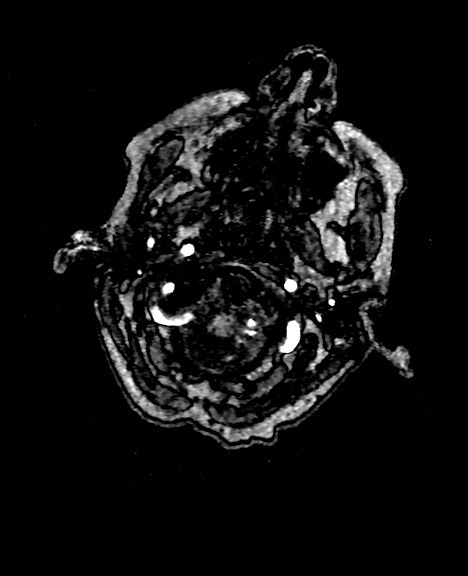
[im 20/232]
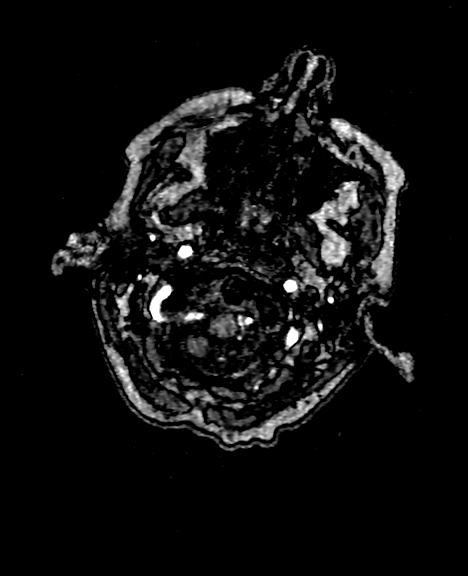
[im 25/232]
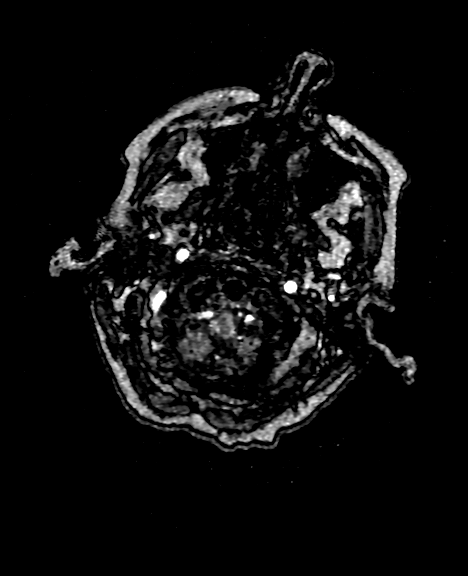
[im 40/232]
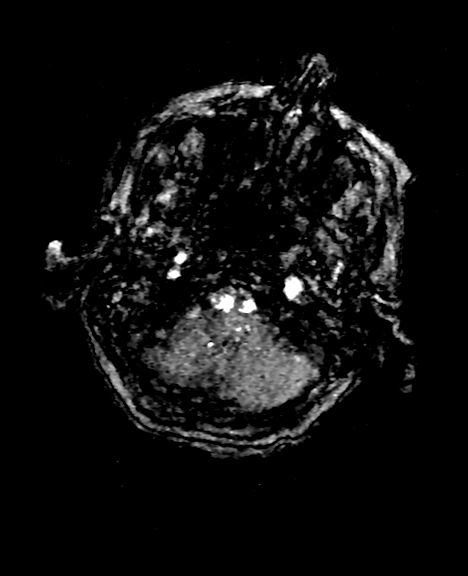
[im 45/232]
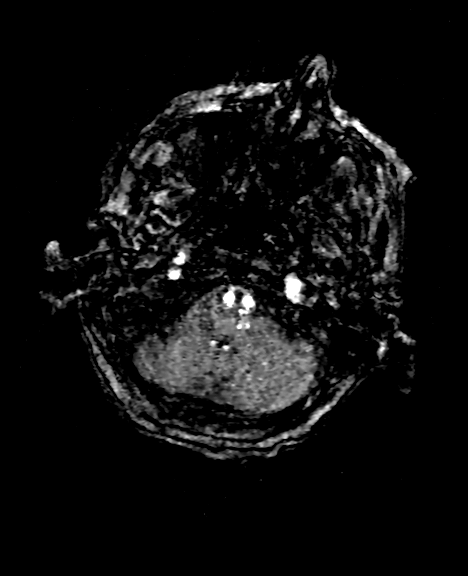
[im 74/232]
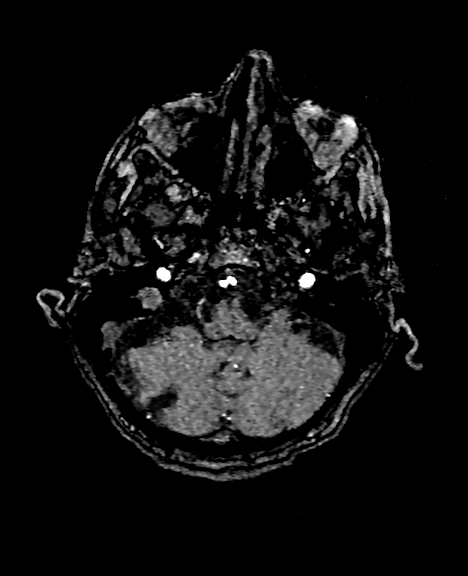
[im 104/232]
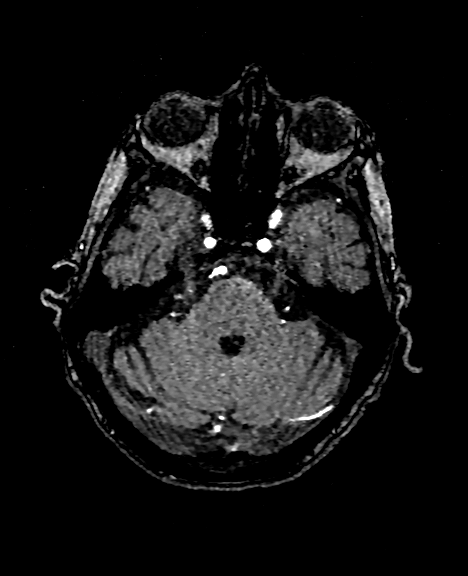
[im 118/232]
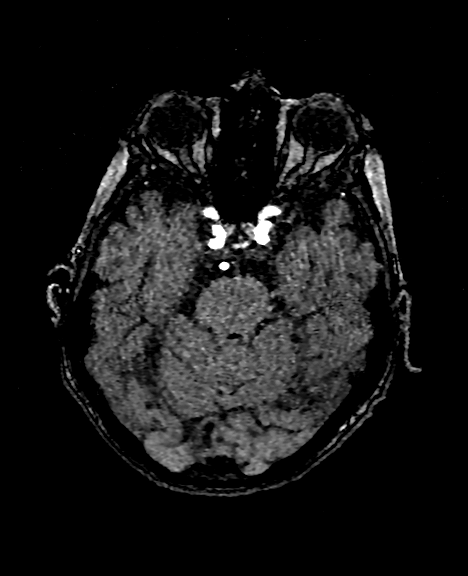
[im 133/232]
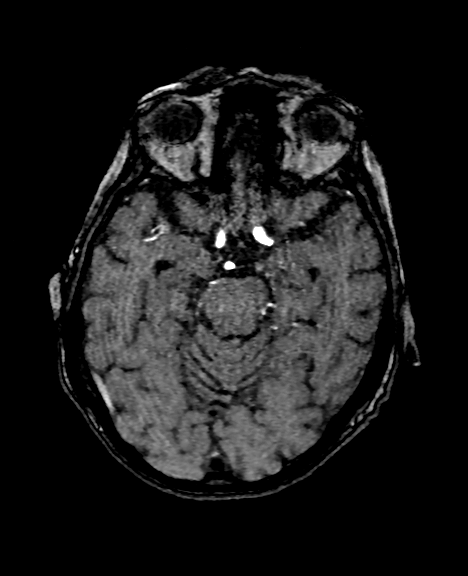
[im 163/232]
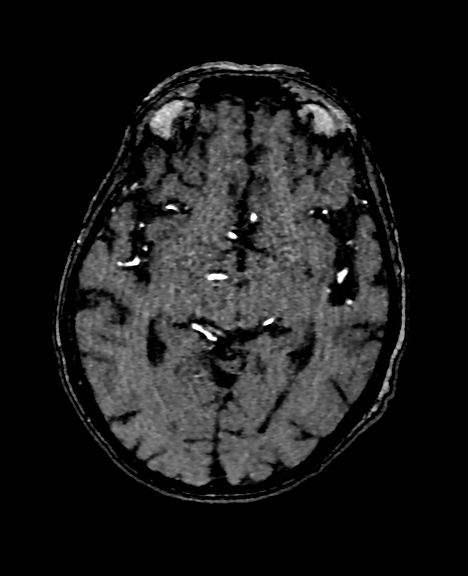
[im 192/232]
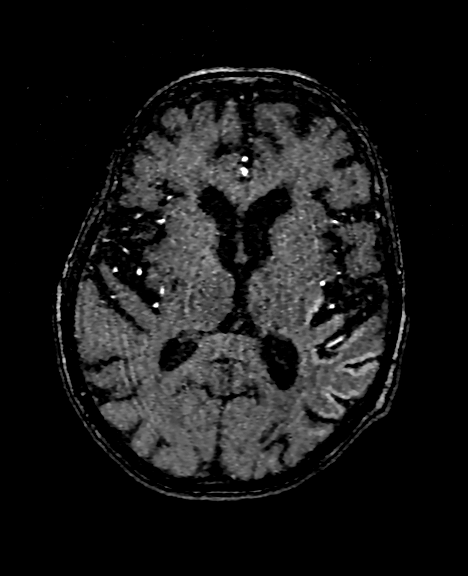
[im 197/232]
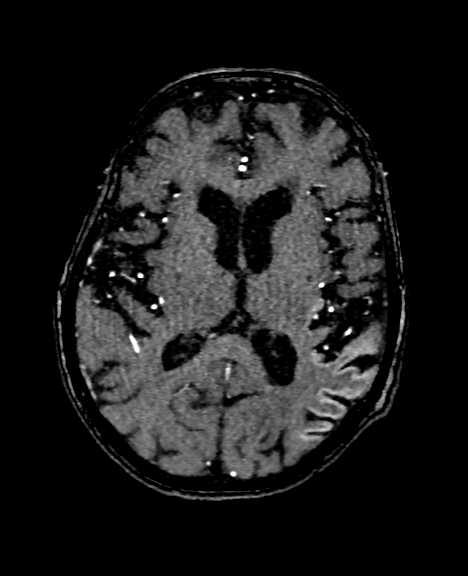
[im 222/232]
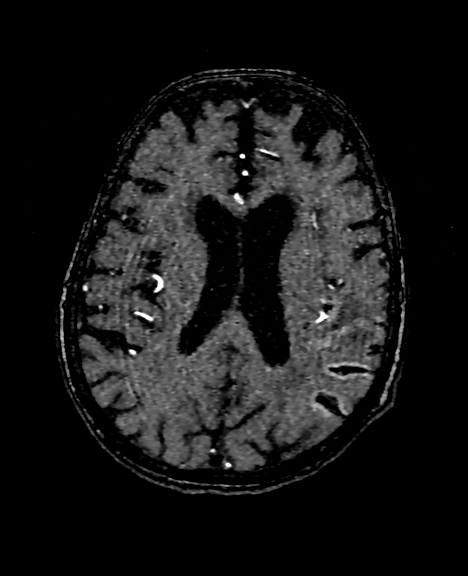

[16 of 48 positions shown; findings below may reference images not displayed]

FINDINGS: MRI HEAD FINDINGS

Brain:

The examination is intermittently motion degraded, limiting
evaluation. Most notably, there is moderate motion degradation of
the axial diffusion-weighted sequence.

Stable, mild generalized cerebral atrophy.

There is diffusion weighted and T2/FLAIR hyperintensity within the
left frontoparietal white matter which has increased in extent since
the prior examination of 08/31/2020, now measuring 3.4 x 3.4 cm in
transaxial dimensions. There is predominantly corresponding
intermediate signal or T2 shine through on the ADC map and this
predominantly reflects late subacute to chronic infarction. However,
there is a small region of true strict diffusion anteriorly which
may reflect acute/early subacute infarction (series 6, image 32).

There are adjacent late subacute to chronic cortical infarction
changes within the left frontoparietal lobe, posterior left insula
and left frontal operculum which have not significantly changed in
extent since the prior MRI of 08/31/2020 (these infarcts were acute
at that time). There is gyriform T1 hyperintensity at this site
consistent with cortical laminar necrosis. Minimal chronic blood
products are also present at this site.

Redemonstrated chronic lacunar infarcts within the bilateral
cerebral white matter and right basal ganglia. Redemonstrated
chronic infarcts within the right cerebellum.

Stable background moderate multifocal T2/FLAIR hyperintensity within
the cerebral white matter which is nonspecific, but consistent with
chronic small vessel ischemic disease. To a lesser degree, chronic
small vessel ischemic changes are also present within the pons.

There are a few scattered supratentorial and infratentorial chronic
microhemorrhages, nonspecific.

Incidentally noted, there is a 5 mm extra-axial dural-based mass
overlying the anterior right frontal lobe which likely reflects a
small meningioma.

No extra-axial fluid collection.

No midline shift.

Vascular: Intracranial arterial circulation reported below.
Unchanged from the prior MRI of 08/31/2020, there is T2/FLAIR
hyperintensity within the left transverse and sigmoid dural venous
sinuses.

Skull and upper cervical spine: No focal marrow lesion.

Sinuses/Orbits: Visualized orbits show no acute finding. Minimal
ethmoid sinus mucosal thickening. No significant mastoid effusion.

MRA HEAD FINDINGS

The examination is motion degraded at the level of the skull base.

The intracranial internal carotid arteries are patent.

The M1 middle cerebral arteries are patent without significant
stenosis. No M2 proximal branch occlusion is identified. Unchanged,
there is a moderate/severe focal stenosis within a distal inferior
division M2 left MCA branch vessel (series 104, image 5). Also
unchanged, there are multiple moderate/severe stenoses within a
small proximal to mid superior division left M2 MCA branch vessel
(series 104, image 8).

The anterior cerebral arteries are patent. The right A1 segment is
hypoplastic or absent.

The intracranial vertebral arteries are patent.

The basilar artery is patent.

The posterior cerebral arteries are patent. Posterior communicating
arteries are hypoplastic or absent bilaterally.

No intracranial aneurysm is identified.

These results will be called to the ordering clinician or
representative by the Radiologist Assistant, and communication
documented in the PACS or [REDACTED].
IMPRESSION: MRI brain:

1. Motion degraded examination.
2. 3.4 x 3.4 cm infarction within the left frontoparietal white
matter which has increased in extent since the prior MRI of
08/31/2020, but appears predominantly late subacute to chronic.
There is a small region at the anterior aspect of this infarct which
demonstrates true restricted diffusion and may reflect an
acute/early subacute component.
3. Adjacent late subacute to chronic cortically based infarction
changes within the left frontoparietal lobes, posterior left insula
and left frontal operculum which have not significantly changed in
extent since the prior MRI.
4. Stable background mild generalized parenchymal atrophy and
moderate chronic small vessel ischemic disease. Redemonstrated
chronic infarcts within the bilateral cerebral white matter, right
basal ganglia and right cerebellum.
5. Unchanged from the prior MRI, there is T2/FLAIR hyperintense
signal abnormality within the left transverse and sigmoid dural
venous sinuses. This may be due to slow flow. However, a dural
venous sinus thrombosis cannot be excluded. Consider CT or MR
venography for further evaluation.
6. Incidentally noted 5 mm meningioma overlying the right frontal
convexity.

MRA head:

1. Motion degraded examination.
2. No intracranial large vessel occlusion.
3. Unchanged moderate/severe focal stenosis within a distal inferior
division M2 left MCA branch vessel.
4. Unchanged sites of moderate/severe stenosis within a proximal to
mid small superior division M2 left MCA branch vessel.

## 2021-07-07 IMAGING — DX DG SHOULDER 2+V*R*
2 series · 2 of 2 positions shown · non-contrast
Comparison: None.

CLINICAL DATA: Right shoulder pain without known injury.

EXAM:
RIGHT SHOULDER - 2+ VIEW

[shoulder ap]
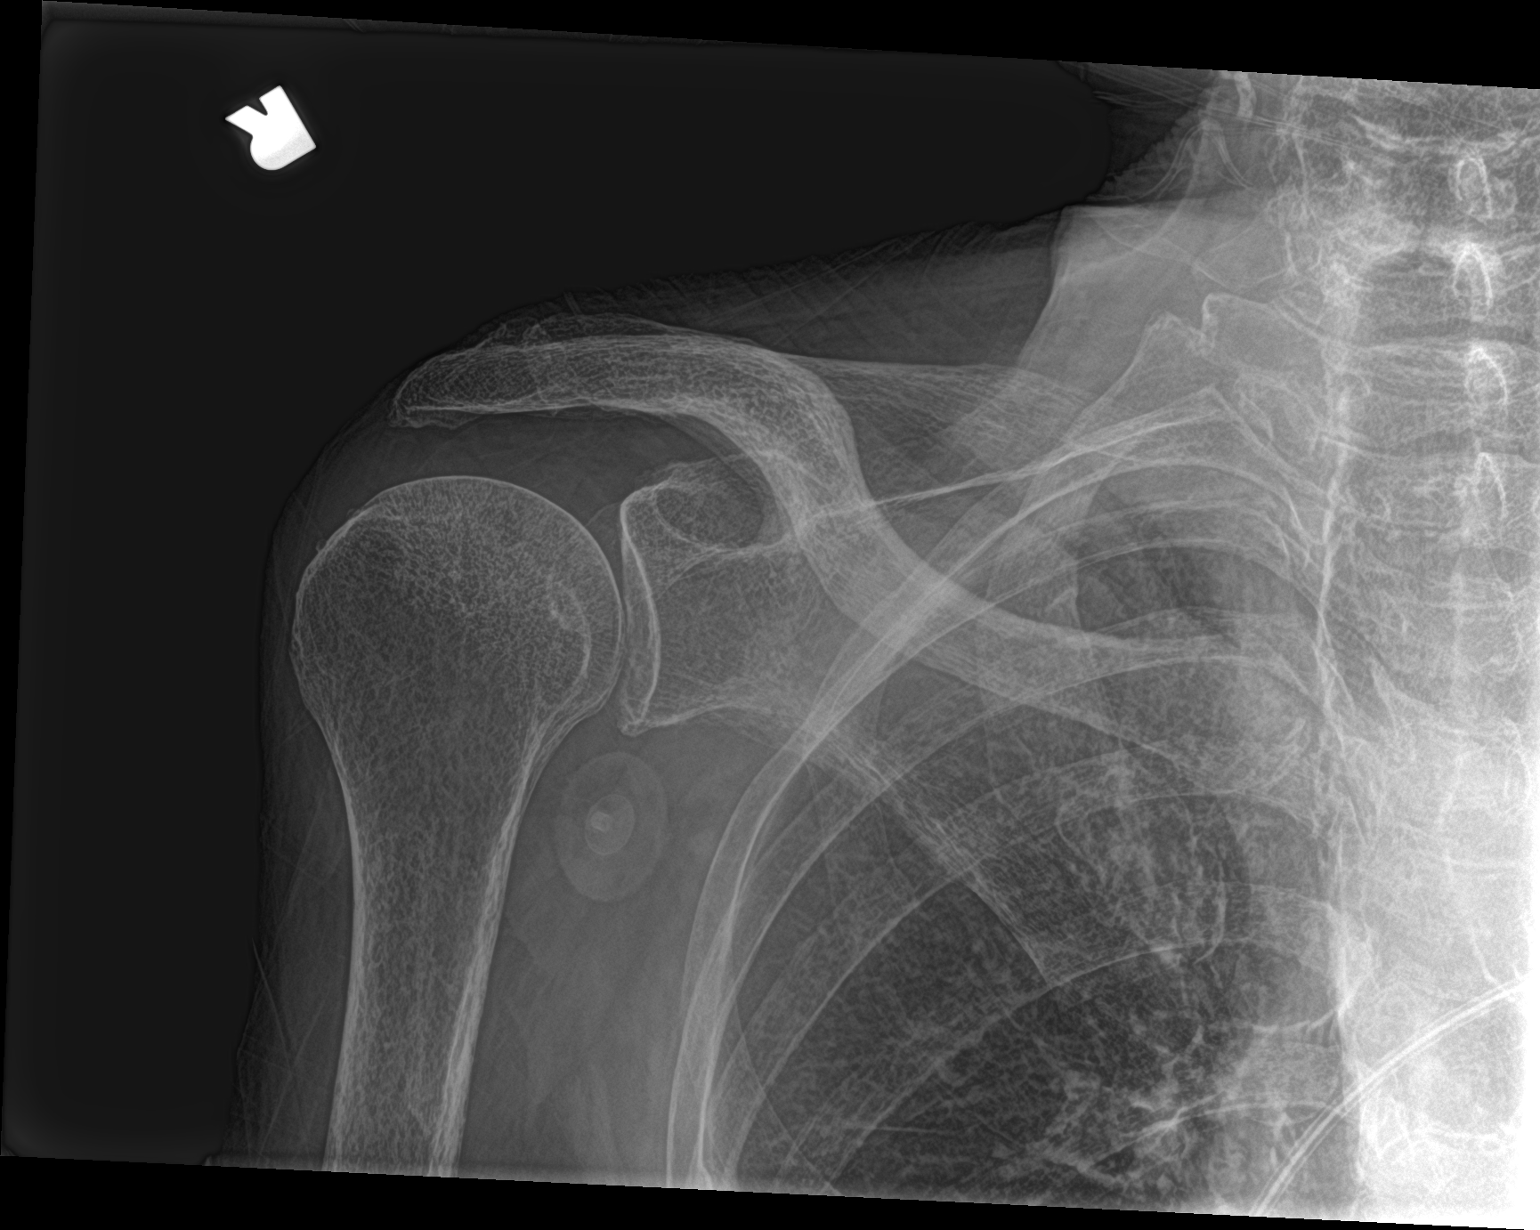

[shoulder y view]
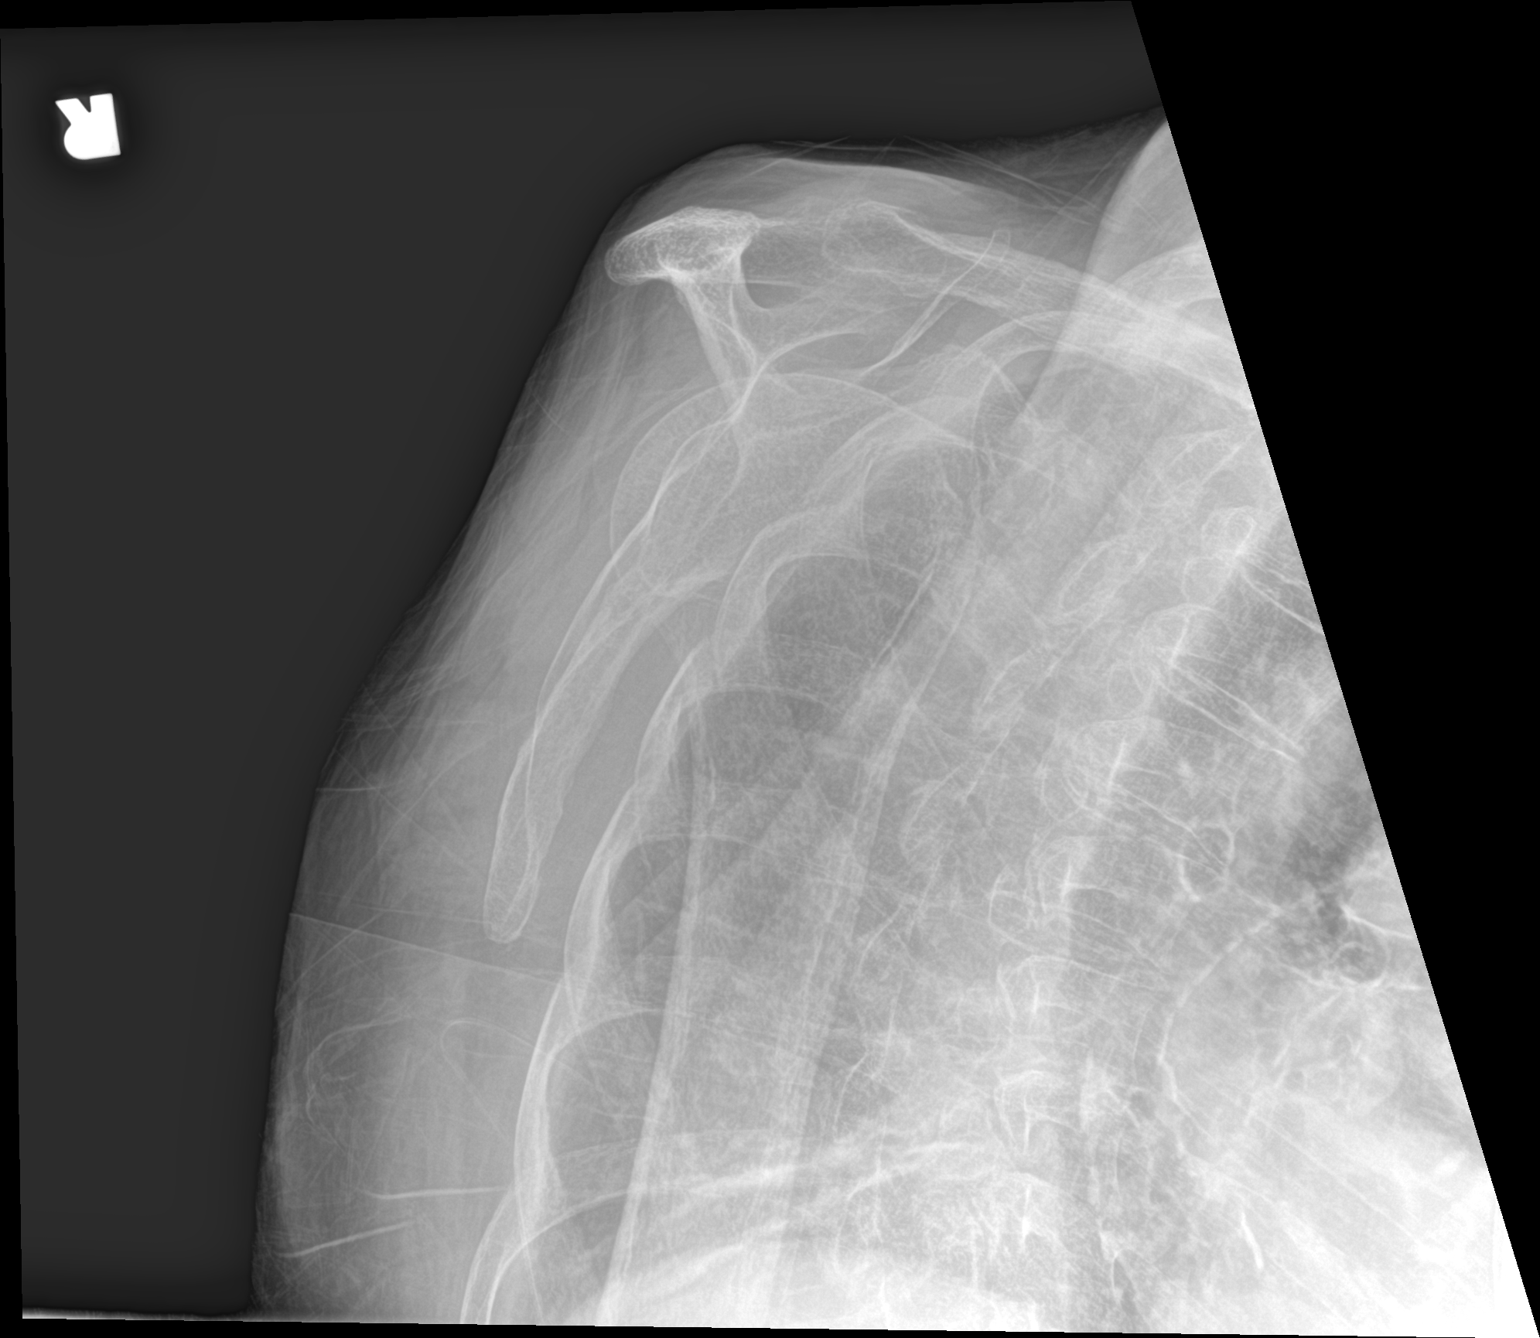

[2 of 2 positions shown; findings below may reference images not displayed]

FINDINGS: There is no evidence of fracture or dislocation. There is no
evidence of arthropathy or other focal bone abnormality. Soft
tissues are unremarkable.
IMPRESSION: Negative.

## 2021-07-07 IMAGING — DX DG WRIST COMPLETE 3+V*R*
4 series · 4 of 4 positions shown · non-contrast
Comparison: None.

CLINICAL DATA: Right wrist pain, limited mobility

EXAM:
RIGHT WRIST - COMPLETE 3+ VIEW

[wrist pa]
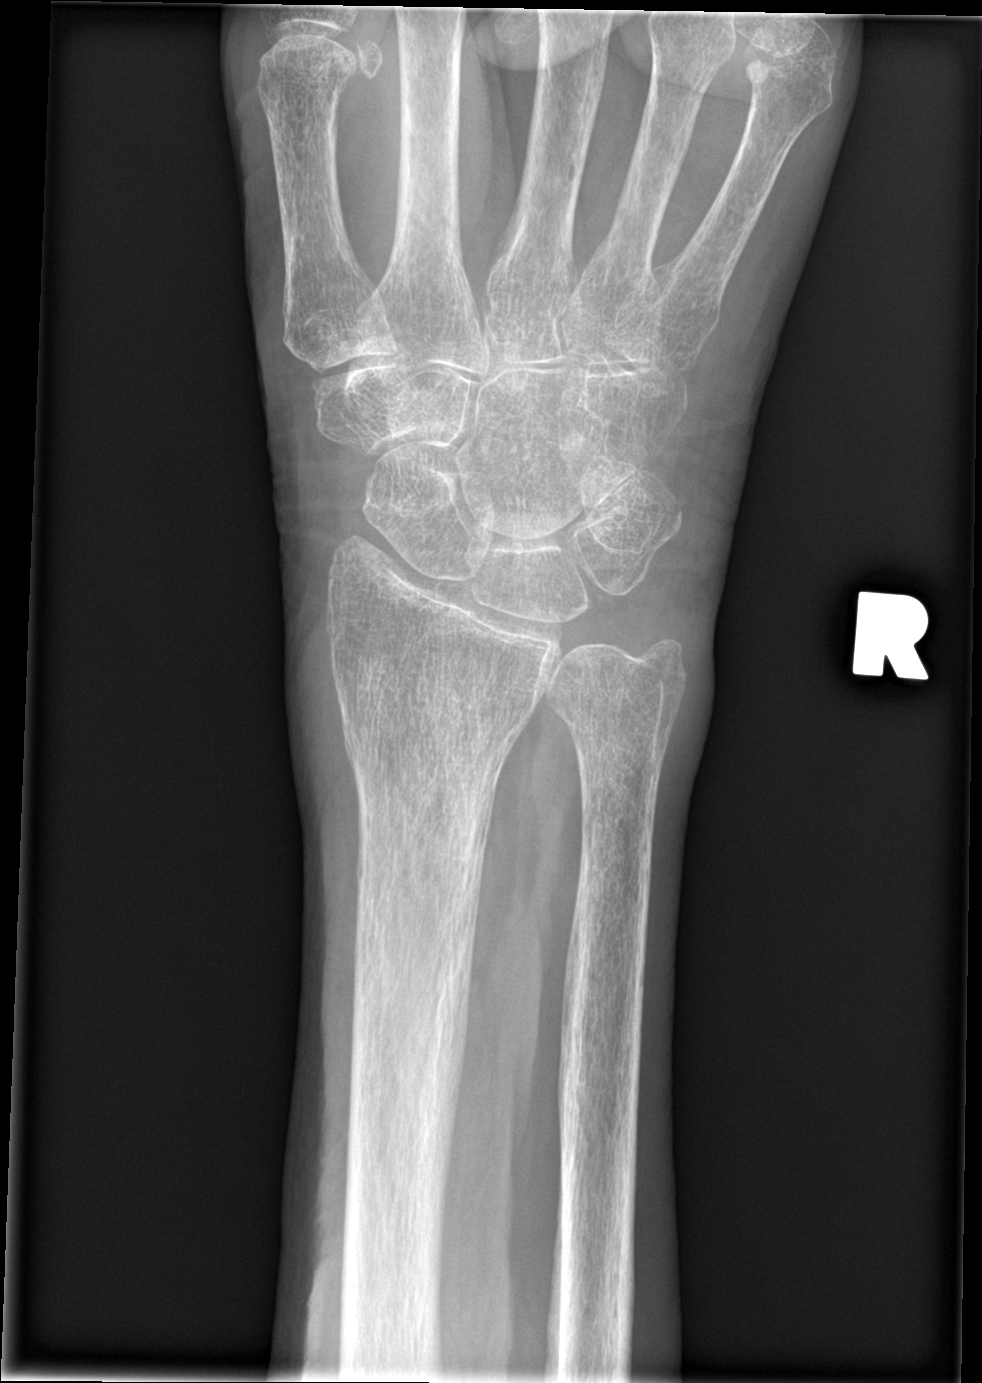

[wrist obl]
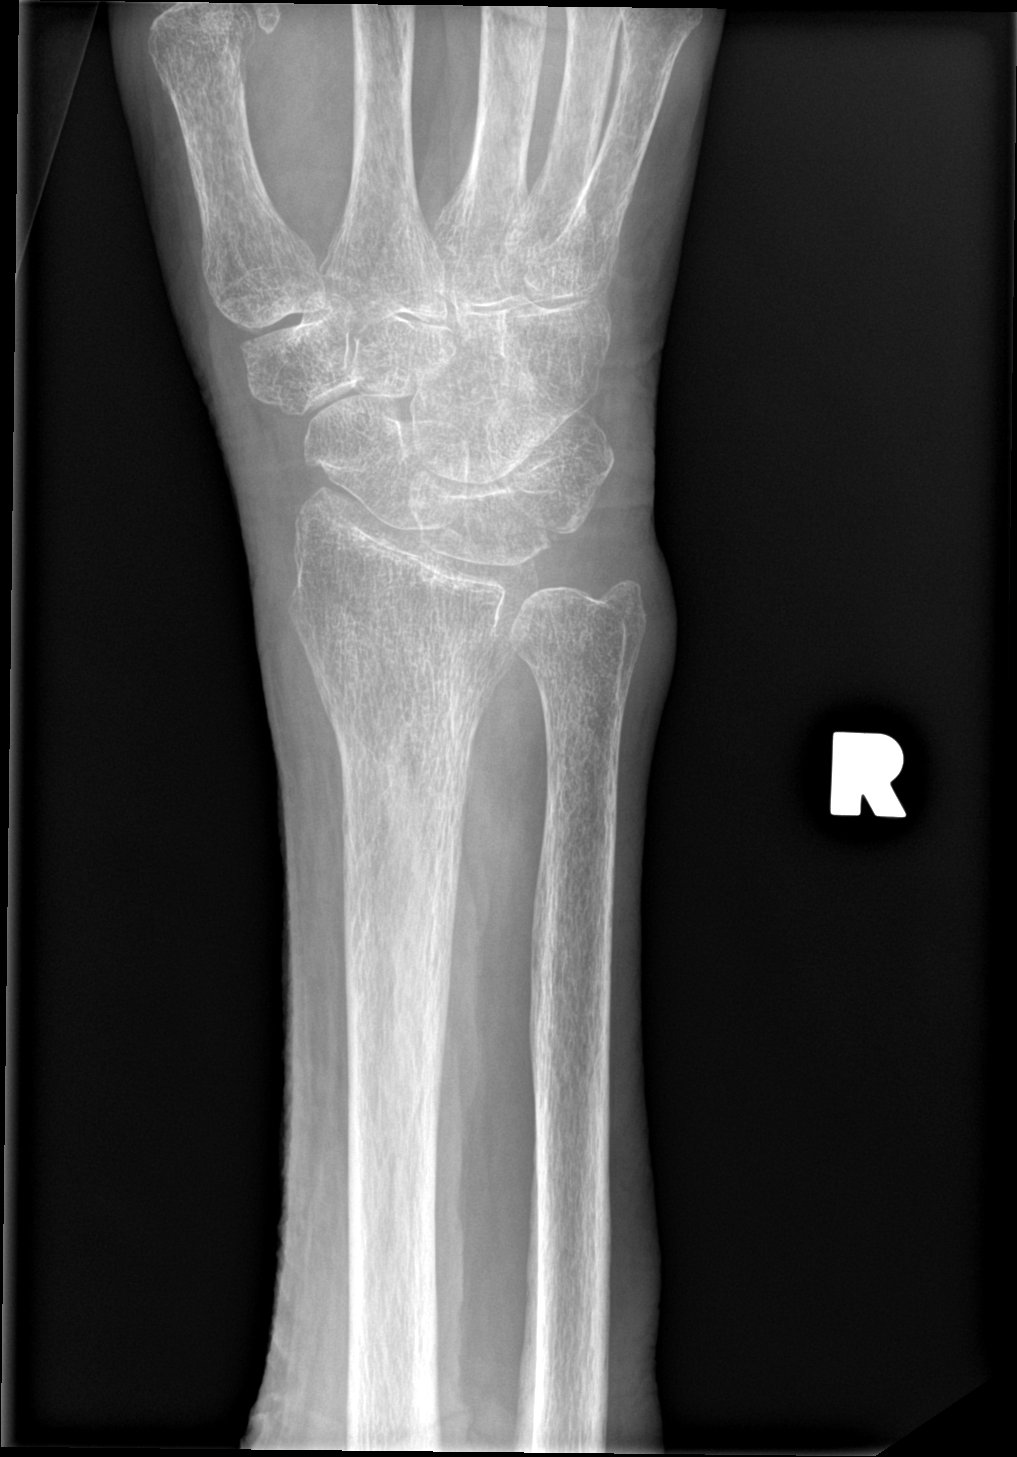

[wrist lat]
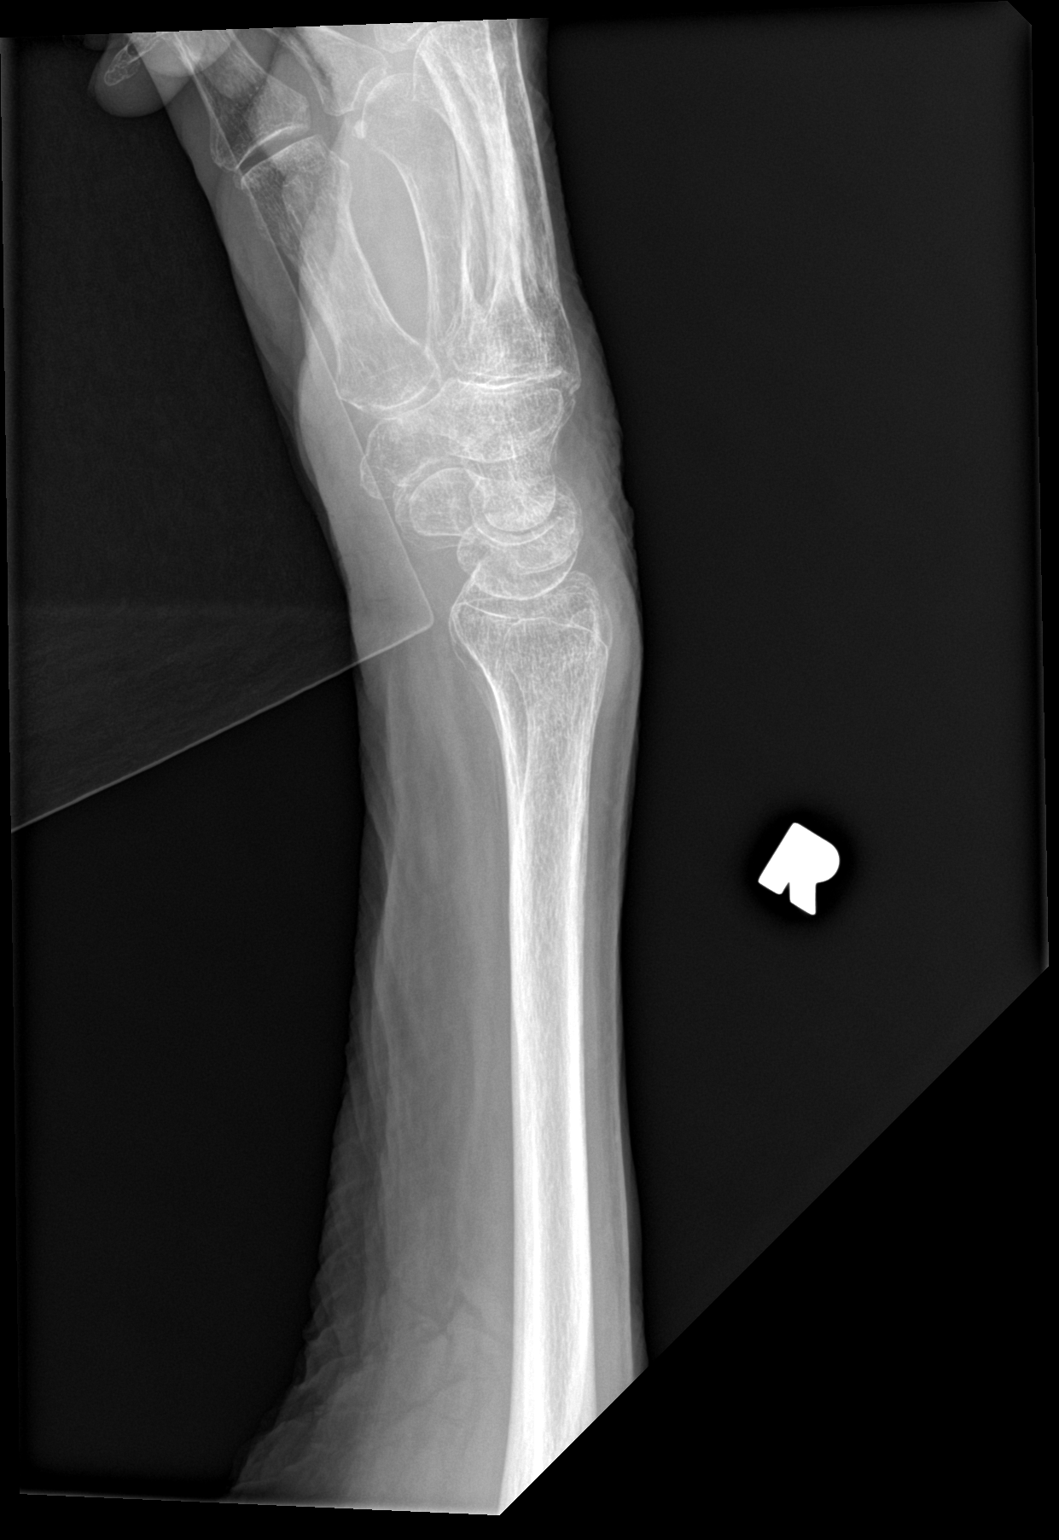

[wrist navicular]
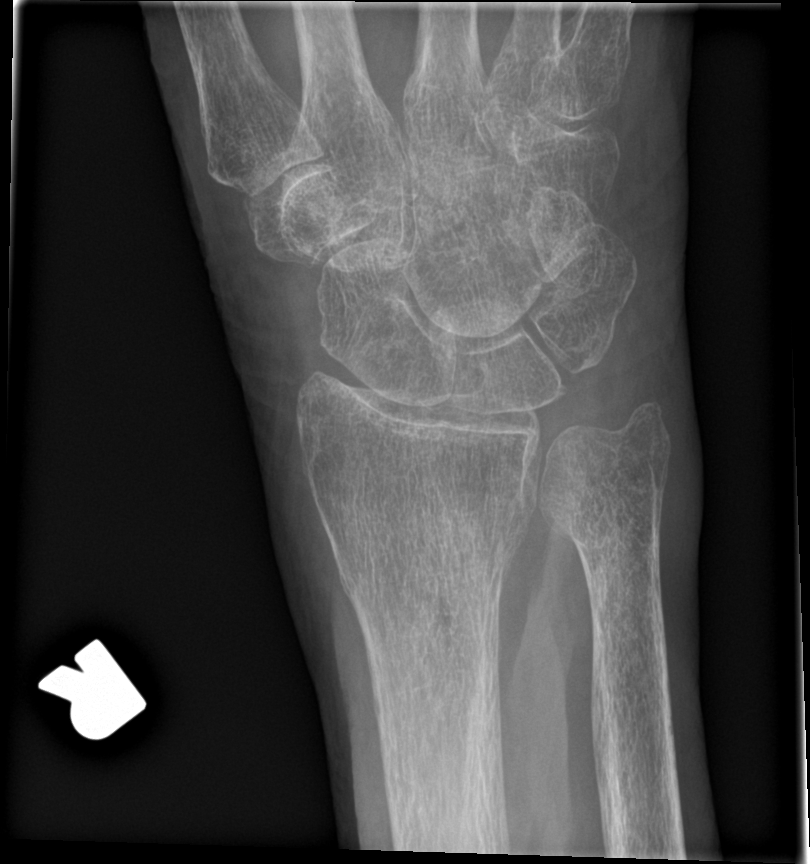

[4 of 4 positions shown; findings below may reference images not displayed]

FINDINGS: Four view radiograph right wrist demonstrates marked osteopenia of
the visualized osseous structures. No definite fracture or
dislocation identified. Possible coalition of the capitate and
hamate, versus is poor profiling of the joint space. Remaining joint
spaces appear preserved. There is mild soft tissue swelling seen
along the volar aspect of the carpus.
IMPRESSION: Soft tissue swelling. Marked osteopenia. No definite fracture or
dislocation.

## 2021-07-07 IMAGING — DX DG ELBOW COMPLETE 3+V*R*
4 series · 4 of 4 positions shown · non-contrast
Comparison: None.

CLINICAL DATA: Right elbow pain without known injury.

EXAM:
RIGHT ELBOW - COMPLETE 3+ VIEW

[elbow ap]
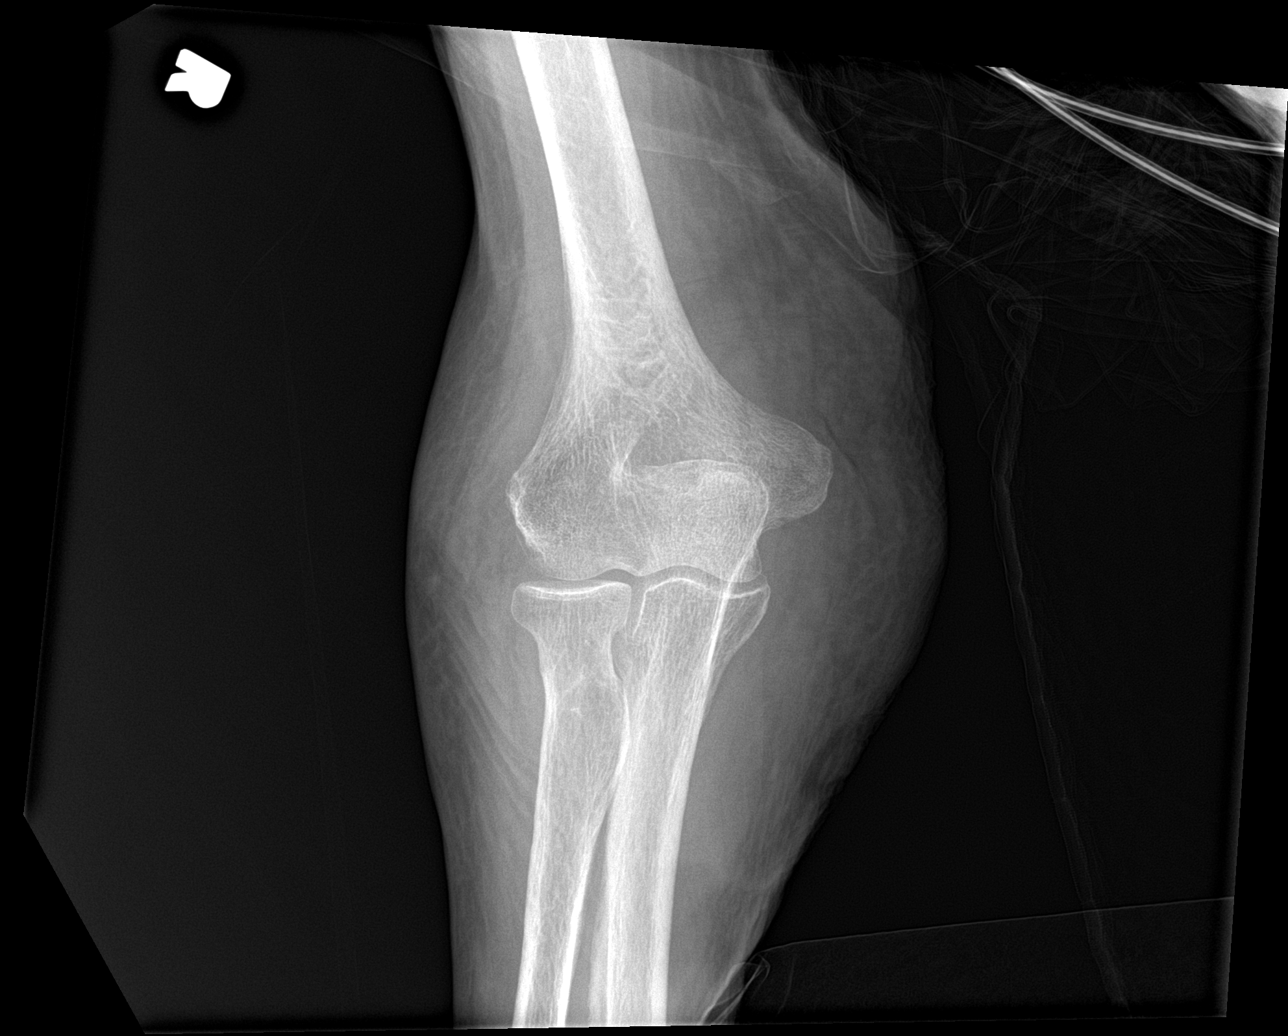

[elbow obl (1 of 2)]
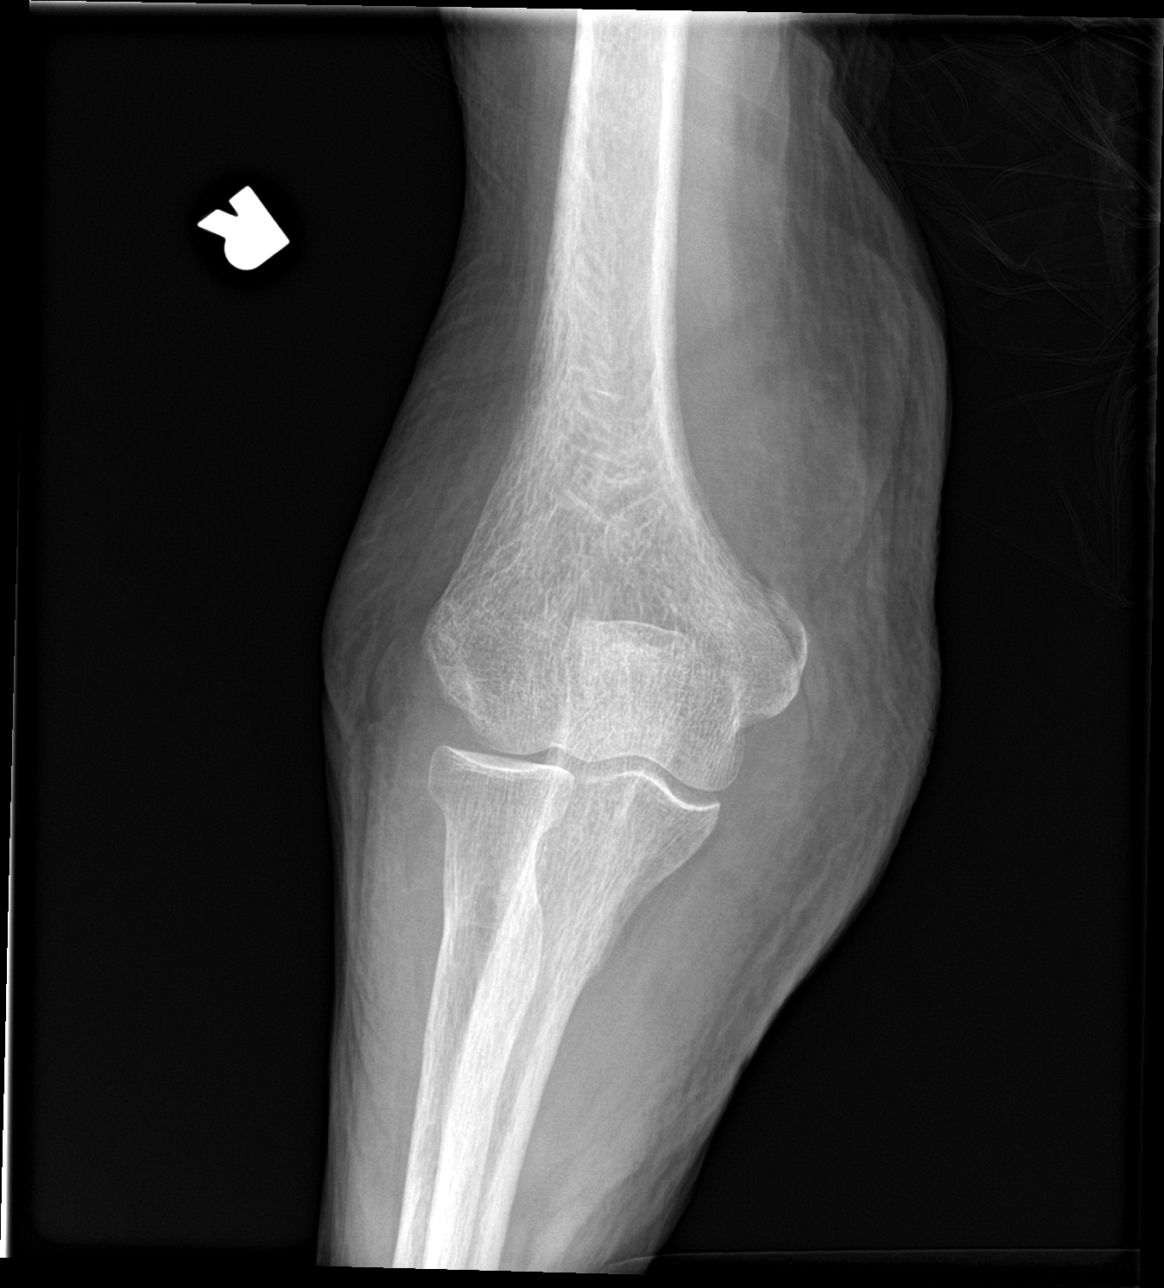

[elbow obl (2 of 2)]
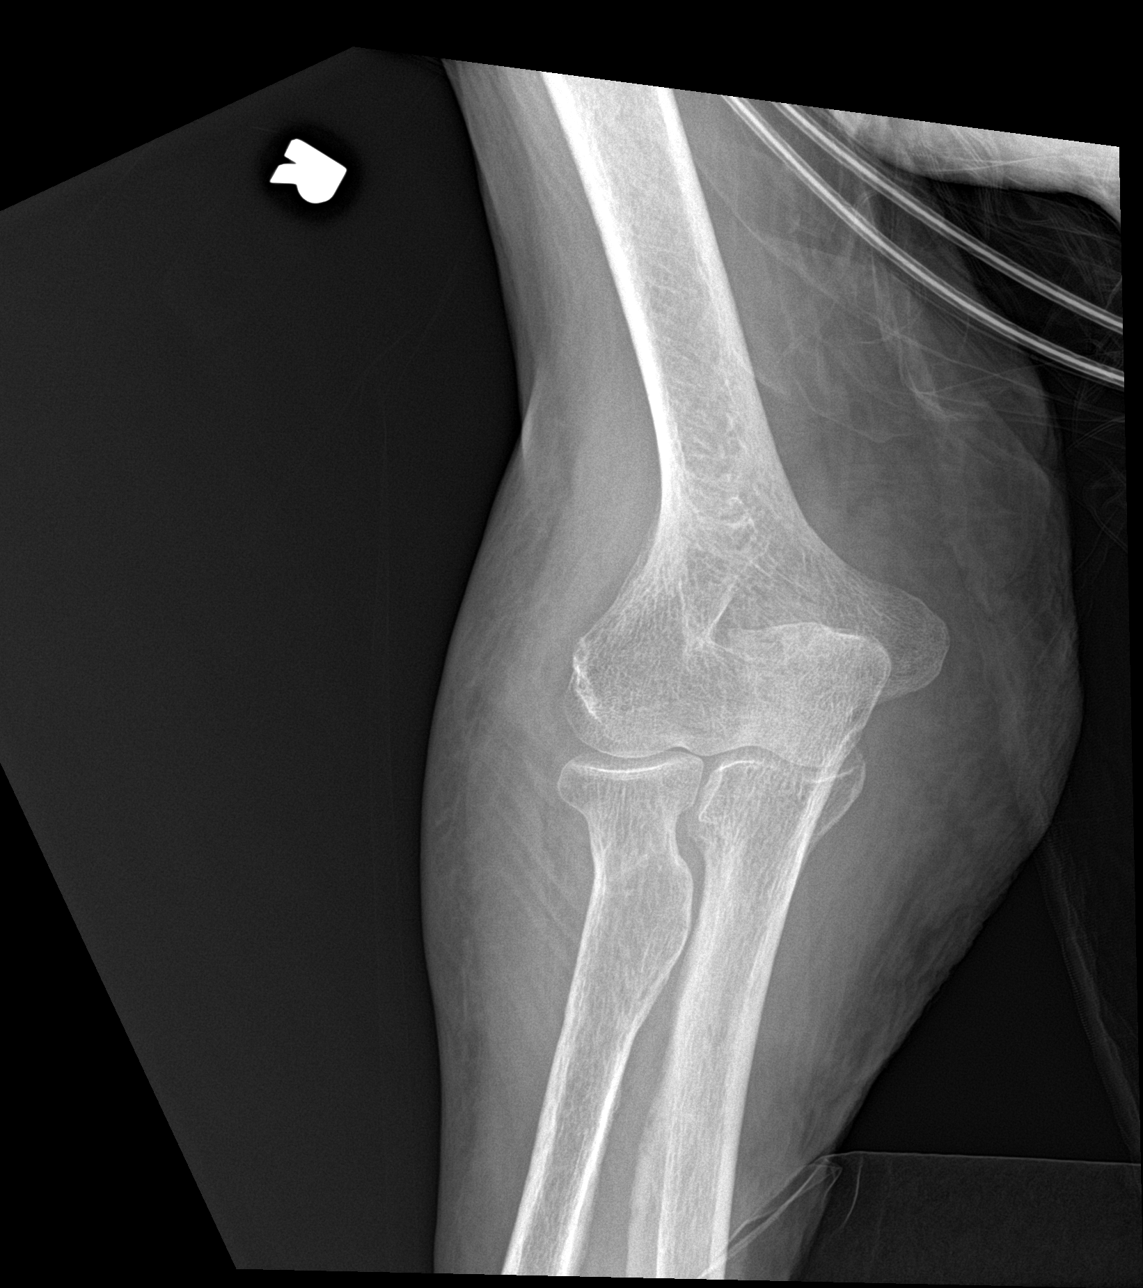

[elbow lat]
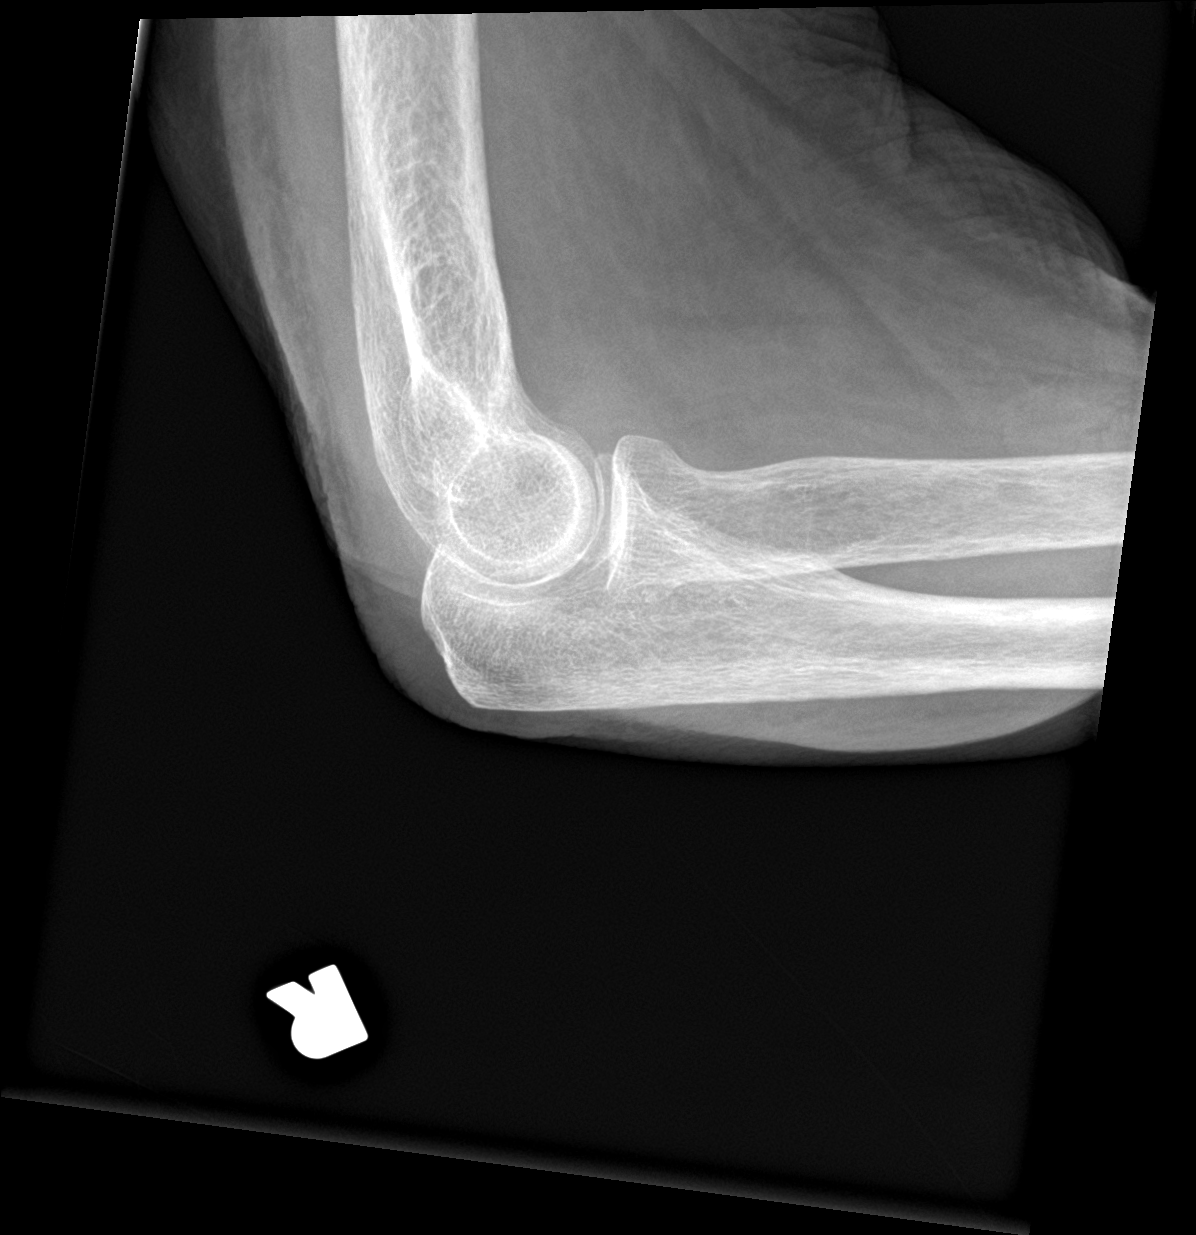

[4 of 4 positions shown; findings below may reference images not displayed]

FINDINGS: There is no evidence of fracture, dislocation, or joint effusion.
There is no evidence of arthropathy or other focal bone abnormality.
Soft tissues are unremarkable.
IMPRESSION: Negative.

## 2021-09-25 ENCOUNTER — Emergency Department (HOSPITAL_COMMUNITY): Payer: Medicare Other

## 2021-09-25 ENCOUNTER — Observation Stay (HOSPITAL_COMMUNITY)
Admission: EM | Admit: 2021-09-25 | Discharge: 2021-09-27 | Disposition: A | Discharge status: Home / Self Care | Payer: Medicare Other | Attending: Family Medicine | Admitting: Family Medicine

## 2021-09-25 ENCOUNTER — Other Ambulatory Visit: Payer: Self-pay

## 2021-09-25 DIAGNOSIS — Z8673 Personal history of transient ischemic attack (TIA), and cerebral infarction without residual deficits: Secondary | ICD-10-CM | POA: Diagnosis not present

## 2021-09-25 DIAGNOSIS — N390 Urinary tract infection, site not specified: Secondary | ICD-10-CM | POA: Diagnosis present

## 2021-09-25 DIAGNOSIS — A419 Sepsis, unspecified organism: Secondary | ICD-10-CM | POA: Insufficient documentation

## 2021-09-25 DIAGNOSIS — Z20822 Contact with and (suspected) exposure to covid-19: Secondary | ICD-10-CM | POA: Diagnosis not present

## 2021-09-25 DIAGNOSIS — I10 Essential (primary) hypertension: Secondary | ICD-10-CM | POA: Diagnosis present

## 2021-09-25 DIAGNOSIS — R4182 Altered mental status, unspecified: Secondary | ICD-10-CM

## 2021-09-25 DIAGNOSIS — G9341 Metabolic encephalopathy: Secondary | ICD-10-CM | POA: Diagnosis not present

## 2021-09-25 DIAGNOSIS — R627 Adult failure to thrive: Secondary | ICD-10-CM | POA: Diagnosis not present

## 2021-09-25 DIAGNOSIS — Z96641 Presence of right artificial hip joint: Secondary | ICD-10-CM | POA: Diagnosis not present

## 2021-09-25 DIAGNOSIS — F0390 Unspecified dementia without behavioral disturbance: Secondary | ICD-10-CM | POA: Diagnosis not present

## 2021-09-25 LAB — URINALYSIS, ROUTINE W REFLEX MICROSCOPIC
Bilirubin Urine: NEGATIVE
Glucose, UA: NEGATIVE mg/dL
Ketones, ur: NEGATIVE mg/dL
Nitrite: POSITIVE — AB
Protein, ur: 30 mg/dL — AB
Specific Gravity, Urine: 1.016 (ref 1.005–1.030)
WBC, UA: >50 WBC/hpf — ABNORMAL HIGH (ref 0–5)
pH: 6 (ref 5.0–8.0)

## 2021-09-25 LAB — CBC WITH DIFFERENTIAL/PLATELET
Abs Immature Granulocytes: 0.07 10*3/uL (ref 0.00–0.07)
Basophils Absolute: 0.1 10*3/uL (ref 0.0–0.1)
Basophils Relative: 1 %
Eosinophils Absolute: 0.3 10*3/uL (ref 0.0–0.5)
Eosinophils Relative: 2 %
HCT: 41.9 % (ref 36.0–46.0)
Hemoglobin: 13.1 g/dL (ref 12.0–15.0)
Immature Granulocytes: 1 %
Lymphocytes Relative: 8 %
Lymphs Abs: 1 10*3/uL (ref 0.7–4.0)
MCH: 29.6 pg (ref 26.0–34.0)
MCHC: 31.3 g/dL (ref 30.0–36.0)
MCV: 94.6 fL (ref 80.0–100.0)
Monocytes Absolute: 0.6 10*3/uL (ref 0.1–1.0)
Monocytes Relative: 4 %
Neutro Abs: 11.1 10*3/uL — ABNORMAL HIGH (ref 1.7–7.7)
Neutrophils Relative %: 84 %
Platelets: 343 10*3/uL (ref 150–400)
RBC: 4.43 MIL/uL (ref 3.87–5.11)
RDW: 13.1 % (ref 11.5–15.5)
WBC: 13.2 10*3/uL — ABNORMAL HIGH (ref 4.0–10.5)
nRBC: 0 % (ref 0.0–0.2)

## 2021-09-25 LAB — COMPREHENSIVE METABOLIC PANEL
ALT: 11 U/L (ref 0–44)
AST: 21 U/L (ref 15–41)
Albumin: 3.7 g/dL (ref 3.5–5.0)
Alkaline Phosphatase: 120 U/L (ref 38–126)
Anion gap: 8 (ref 5–15)
BUN: 18 mg/dL (ref 8–23)
CO2: 28 mmol/L (ref 22–32)
Calcium: 9.9 mg/dL (ref 8.9–10.3)
Chloride: 104 mmol/L (ref 98–111)
Creatinine, Ser: 0.45 mg/dL (ref 0.44–1.00)
GFR, Estimated: >60 mL/min (ref >60)
Glucose, Bld: 140 mg/dL — ABNORMAL HIGH (ref 70–99)
Potassium: 3.6 mmol/L (ref 3.5–5.1)
Sodium: 140 mmol/L (ref 135–145)
Total Bilirubin: 0.2 mg/dL — ABNORMAL LOW (ref 0.3–1.2)
Total Protein: 6.7 g/dL (ref 6.5–8.1)

## 2021-09-25 LAB — PROTIME-INR
INR: 1 (ref 0.8–1.2)
Prothrombin Time: 13 seconds (ref 11.4–15.2)

## 2021-09-25 LAB — LACTIC ACID, PLASMA
Lactic Acid, Venous: 2.2 mmol/L (ref 0.5–1.9)
Lactic Acid, Venous: 1.3 mmol/L (ref 0.5–1.9)

## 2021-09-25 LAB — RESP PANEL BY RT-PCR (FLU A&B, COVID) ARPGX2
Influenza A by PCR: NEGATIVE
Influenza B by PCR: NEGATIVE
SARS Coronavirus 2 by RT PCR: NEGATIVE

## 2021-09-25 LAB — TROPONIN I (HIGH SENSITIVITY)
Troponin I (High Sensitivity): 6 ng/L
Troponin I (High Sensitivity): 4 ng/L

## 2021-09-25 LAB — LIPASE, BLOOD: Lipase: 22 U/L (ref 11–51)

## 2021-09-25 MED ORDER — SODIUM CHLORIDE 0.9% FLUSH
3.0000 mL | Freq: Two times a day (BID) | INTRAVENOUS | Status: DC
  Administered 2021-09-27: 3 mL via INTRAVENOUS

## 2021-09-25 MED ORDER — ACETAMINOPHEN 650 MG RE SUPP: 650.0000 mg | Freq: Four times a day (QID) | RECTAL | Status: DC | PRN

## 2021-09-25 MED ORDER — SODIUM CHLORIDE 0.9% FLUSH
3.0000 mL | Freq: Two times a day (BID) | INTRAVENOUS | Status: DC
  Administered 2021-09-25 – 2021-09-26 (×3): 3 mL via INTRAVENOUS

## 2021-09-25 MED ORDER — BUPROPION HCL 75 MG PO TABS
75.0000 mg | ORAL_TABLET | Freq: Every day | ORAL | Status: DC
  Administered 2021-09-27: 75 mg via ORAL
  Filled 2021-09-25 (×4): qty 1

## 2021-09-25 MED ORDER — GABAPENTIN 100 MG PO CAPS
100.0000 mg | ORAL_CAPSULE | Freq: Three times a day (TID) | ORAL | Status: DC
  Administered 2021-09-25 – 2021-09-27 (×5): 100 mg via ORAL
  Filled 2021-09-25 (×7): qty 1

## 2021-09-25 MED ORDER — SODIUM CHLORIDE 0.9 % IV SOLN: INTRAVENOUS | Status: AC

## 2021-09-25 MED ORDER — SODIUM CHLORIDE 0.9 % IV SOLN
1.0000 g | INTRAVENOUS | Status: DC
  Administered 2021-09-25 – 2021-09-26 (×2): 1 g via INTRAVENOUS
  Filled 2021-09-25 (×3): qty 10

## 2021-09-25 MED ORDER — SODIUM CHLORIDE 0.9 % IV BOLUS (SEPSIS)
1000.0000 mL | Freq: Once | INTRAVENOUS | Status: AC
  Administered 2021-09-25: 1000 mL via INTRAVENOUS

## 2021-09-25 MED ORDER — POLYETHYLENE GLYCOL 3350 17 G PO PACK: 17.0000 g | PACK | Freq: Every day | ORAL | Status: DC | PRN

## 2021-09-25 MED ORDER — ONDANSETRON HCL 4 MG/2ML IJ SOLN: 4.0000 mg | Freq: Four times a day (QID) | INTRAMUSCULAR | Status: DC | PRN

## 2021-09-25 MED ORDER — VANCOMYCIN HCL IN DEXTROSE 1-5 GM/200ML-% IV SOLN
1000.0000 mg | Freq: Once | INTRAVENOUS | Status: AC
  Administered 2021-09-25: 1000 mg via INTRAVENOUS
  Filled 2021-09-25: qty 200

## 2021-09-25 MED ORDER — SODIUM CHLORIDE 0.9 % IV BOLUS
500.0000 mL | Freq: Once | INTRAVENOUS | Status: AC
  Administered 2021-09-25: 500 mL via INTRAVENOUS

## 2021-09-25 MED ORDER — ASPIRIN 81 MG PO CHEW
81.0000 mg | CHEWABLE_TABLET | Freq: Every day | ORAL | Status: DC
  Administered 2021-09-25 – 2021-09-27 (×2): 81 mg via ORAL
  Filled 2021-09-25 (×3): qty 1

## 2021-09-25 MED ORDER — ADULT MULTIVITAMIN W/MINERALS CH
1.0000 | ORAL_TABLET | Freq: Every day | ORAL | Status: DC
  Administered 2021-09-27: 1 via ORAL
  Filled 2021-09-25 (×2): qty 1

## 2021-09-25 MED ORDER — ENOXAPARIN SODIUM 30 MG/0.3ML IJ SOSY
30.0000 mg | PREFILLED_SYRINGE | INTRAMUSCULAR | Status: DC
  Administered 2021-09-25 – 2021-09-26 (×2): 30 mg via SUBCUTANEOUS
  Filled 2021-09-25 (×3): qty 0.3

## 2021-09-25 MED ORDER — ACETAMINOPHEN 325 MG PO TABS: 650.0000 mg | ORAL_TABLET | Freq: Four times a day (QID) | ORAL | Status: DC | PRN

## 2021-09-25 MED ORDER — ENSURE ENLIVE PO LIQD
237.0000 mL | Freq: Two times a day (BID) | ORAL | Status: DC
  Administered 2021-09-26 – 2021-09-27 (×2): 237 mL via ORAL

## 2021-09-25 MED ORDER — SODIUM CHLORIDE 0.9 % IV SOLN: 250.0000 mL | INTRAVENOUS | Status: DC | PRN

## 2021-09-25 MED ORDER — ATORVASTATIN CALCIUM 40 MG PO TABS
40.0000 mg | ORAL_TABLET | Freq: Every day | ORAL | Status: DC
  Administered 2021-09-25 – 2021-09-27 (×2): 40 mg via ORAL
  Filled 2021-09-25 (×3): qty 1

## 2021-09-25 MED ORDER — METRONIDAZOLE 500 MG/100ML IV SOLN
500.0000 mg | Freq: Once | INTRAVENOUS | Status: AC
  Administered 2021-09-25: 500 mg via INTRAVENOUS
  Filled 2021-09-25: qty 100

## 2021-09-25 MED ORDER — VITAMIN D 25 MCG (1000 UNIT) PO TABS
1000.0000 [IU] | ORAL_TABLET | Freq: Every day | ORAL | Status: DC
  Administered 2021-09-27: 1000 [IU] via ORAL
  Filled 2021-09-25 (×4): qty 1

## 2021-09-25 MED ORDER — VANCOMYCIN HCL 500 MG/100ML IV SOLN: 500.0000 mg | INTRAVENOUS | Status: DC

## 2021-09-25 MED ORDER — SODIUM CHLORIDE 0.9% FLUSH: 3.0000 mL | INTRAVENOUS | Status: DC | PRN

## 2021-09-25 MED ORDER — MIRTAZAPINE 15 MG PO TABS
7.5000 mg | ORAL_TABLET | Freq: Every day | ORAL | Status: DC
  Administered 2021-09-25 – 2021-09-26 (×2): 7.5 mg via ORAL
  Filled 2021-09-25 (×3): qty 1

## 2021-09-25 MED ORDER — SODIUM CHLORIDE 0.9 % IV SOLN: 2.0000 g | Freq: Two times a day (BID) | INTRAVENOUS | Status: DC

## 2021-09-25 MED ORDER — AMLODIPINE BESYLATE 5 MG PO TABS
2.5000 mg | ORAL_TABLET | Freq: Every day | ORAL | Status: DC
  Administered 2021-09-25 – 2021-09-27 (×2): 2.5 mg via ORAL
  Filled 2021-09-25 (×3): qty 1

## 2021-09-25 MED ORDER — DONEPEZIL HCL 5 MG PO TABS
5.0000 mg | ORAL_TABLET | Freq: Every day | ORAL | Status: DC
  Administered 2021-09-25 – 2021-09-27 (×2): 5 mg via ORAL
  Filled 2021-09-25 (×3): qty 1

## 2021-09-25 MED ORDER — ONDANSETRON HCL 4 MG PO TABS: 4.0000 mg | ORAL_TABLET | Freq: Four times a day (QID) | ORAL | Status: DC | PRN

## 2021-09-25 MED ORDER — SODIUM CHLORIDE 0.9 % IV SOLN
2.0000 g | Freq: Once | INTRAVENOUS | Status: AC
  Administered 2021-09-25: 2 g via INTRAVENOUS
  Filled 2021-09-25: qty 2

## 2021-09-25 MED ORDER — CLOPIDOGREL BISULFATE 75 MG PO TABS
75.0000 mg | ORAL_TABLET | Freq: Every day | ORAL | Status: DC
  Administered 2021-09-27: 75 mg via ORAL
  Filled 2021-09-25 (×2): qty 1

## 2021-09-25 MED ORDER — SODIUM CHLORIDE 0.9 % IV SOLN: 2.0000 g | Freq: Once | INTRAVENOUS | Status: DC

## 2021-09-25 MED ORDER — HYDROCODONE-ACETAMINOPHEN 5-325 MG PO TABS
1.0000 | ORAL_TABLET | Freq: Four times a day (QID) | ORAL | Status: DC | PRN
  Administered 2021-09-25 – 2021-09-27 (×3): 1 via ORAL
  Filled 2021-09-25 (×3): qty 1

## 2021-09-25 MED ORDER — TRAZODONE HCL 50 MG PO TABS: 50.0000 mg | ORAL_TABLET | Freq: Every evening | ORAL | Status: DC | PRN

## 2021-09-25 MED ORDER — BISACODYL 10 MG RE SUPP: 10.0000 mg | Freq: Every day | RECTAL | Status: DC | PRN

## 2021-09-25 MED ORDER — ADULT MULTIVITAMIN W/MINERALS CH: 1.0000 | ORAL_TABLET | Freq: Every day | ORAL | Status: DC

## 2021-09-25 NOTE — ED Notes (Signed)
Attempted to call report. Charge RN said bed is not assigned.

## 2021-09-25 NOTE — ED Notes (Signed)
Pt unable to sign Mse d/t AMS. Pt placed on bed alarm for safety.

## 2021-09-25 NOTE — Plan of Care (Addendum)
Pt rested during overnight after pain medication given. Pt arrived to floor agitated and complaining of right shoulder pain. Norco given x 1 and was effective. Pt able to turn and tolerate repositioning. Unable to complete full admission questions due to pt is poor historian.  Problem: Education: Goal: Knowledge of General Education information will improve Description: Including pain rating scale, medication(s)/side effects and non-pharmacologic comfort measures Outcome: Progressing   Problem: Health Behavior/Discharge Planning: Goal: Ability to manage health-related needs will improve Outcome: Progressing   Problem: Clinical Measurements: Goal: Ability to maintain clinical measurements within normal limits will improve Outcome: Progressing Goal: Will remain free from infection Outcome: Progressing Goal: Diagnostic test results will improve Outcome: Progressing Goal: Respiratory complications will improve Outcome: Progressing Goal: Cardiovascular complication will be avoided Outcome: Progressing   Problem: Activity: Goal: Risk for activity intolerance will decrease Outcome: Progressing   Problem: Nutrition: Goal: Adequate nutrition will be maintained Outcome: Progressing   Problem: Coping: Goal: Level of anxiety will decrease Outcome: Progressing   Problem: Elimination: Goal: Will not experience complications related to bowel motility Outcome: Progressing Goal: Will not experience complications related to urinary retention Outcome: Progressing   Problem: Pain Managment: Goal: General experience of comfort will improve Outcome: Progressing   Problem: Safety: Goal: Ability to remain free from injury will improve Outcome: Progressing   Problem: Skin Integrity: Goal: Risk for impaired skin integrity will decrease Outcome: Progressing

## 2021-09-25 NOTE — ED Provider Notes (Signed)
Emergency Department Provider Note   I have reviewed the triage vital signs and the nursing notes.   HISTORY  Chief Complaint Altered Mental Status   HPI Angelica Bell is a 87 y.o. female with past medical history reviewed below presents to the emergency department with acute onset mental status change.  She is coming to [place] from a nursing facility.  We called to discuss with staff report she was sitting up in her chair and talkative when she suddenly felt like she wanted to lay back.  Staff reports she became much less responsive and felt cool to touch.  They noted some diaphoresis.  They did not begin CPR.  During the initial unresponsive episode staff reported BP of 60 over palp.  At this time, staff apparently administered a single nitroglycerin tablet thinking that the etiology could be cardiac.  Patient fairly then became agitated and shouting and then again would seem less responsive.  No reported seizure activity.   Patient is able to tell me that she is "cold" and having pain but unable to localize.   Level 5 caveat: AMS.   Past Medical History:  Diagnosis Date   Chronic back pain    with degenerative joint disease   Chronic pain disorder    with narcotic management   Dry eyes    Frequent falls    HOH (hard of hearing)    Hypertension    Multiple rib fractures 02/2020   Osteoporosis    PE (pulmonary embolism)    After hysterectomy in 1967   Pneumonia    Rhabdomyolysis    Stroke [facility])     Patient Active Problem List   Diagnosis Date Noted   Acute lower UTI--POA 09/25/2021   AMS (altered mental status) 10/12/2020   Acute ischemic stroke ([place]) 10/11/2020   Hyperglycemia 10/11/2020   Failure to thrive in adult 10/11/2020   Palliative care by specialist    Goals of care, counseling/discussion    DNR (do not resuscitate) discussion    Acute CVA (cerebrovascular accident) ([place]) 46/27/0350   Acute metabolic encephalopathy 09/38/1829   Acute respiratory  failure with hypoxia ([place]) 05/17/2020   Opioid overdose ([place]) 05/13/2020   DNR (do not resuscitate) 03/09/2020   Frequent falls 03/09/2020   Multiple rib fractures 03/08/2020   Common bile duct dilatation 03/08/2020   Leukocytosis 03/08/2020   Normocytic anemia 03/08/2020   Hypomagnesemia 02/08/2020   Fall 02/06/2020   AKI (acute kidney injury) ([place]) 02/05/2020   UTI (urinary tract infection) 09/01/2019   Rhabdomyolysis 09/01/2019   Shock ([place]) 08/31/2019   Acute cystitis 08/03/2019   Confusion and disorientation 08/03/2019   Cerebrovascular accident (CVA) ([place])    ARF (acute renal failure) ([place]) 07/26/2019   Hyponatremia 07/26/2019   Abnormal liver function 07/26/2019   Peripheral neuropathy    Drug overdose    Polypharmacy    Altered mental status 10/25/2017   Closed displaced intertrochanteric fracture of left femur ([place]) 02/16/2017   Hip fracture, right ([place]) 12/25/2015   Chronic pain 12/24/2015   Fracture of femoral neck, right, closed ([place]) 12/24/2015   Essential hypertension 12/24/2015   Community acquired pneumonia 04/15/2013   Hypokalemia 04/15/2013   Labial cyst 02/08/2012   POSTMENOPAUSAL SYNDROME 11/28/2009   Mononeuritis of lower limb 02/22/2009   INSOMNIA-SLEEP DISORDER-UNSPEC 10/12/2008   Lumbago 08/02/2008   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   ANXIETY DEPRESSION 12/16/2007   Hyperlipidemia 07/08/2007   Osteoarthritis 07/08/2007   Osteoporosis 07/08/2007   SCOLIOSIS NEC 07/08/2007  Past Surgical History:  Procedure Laterality Date   ABDOMINAL HYSTERECTOMY     APPENDECTOMY     BREAST ENHANCEMENT SURGERY     CATARACT EXTRACTION     FEMUR IM NAIL Left 02/17/2017   Procedure: INTRAMEDULLARY (IM) NAIL FEMORAL;  Surgeon: Nicholes Stairs, MD;  Location: [facility];  Service: Orthopedics;  Laterality: Left;   HIP ARTHROPLASTY Right 12/24/2015   Procedure: ARTHROPLASTY BIPOLAR HIP (HEMIARTHROPLASTY);  Surgeon: Netta Cedars, MD;  Location: [facility]  ORS;  Service: Orthopedics;  Laterality: Right;   TONSILLECTOMY      Allergies Hctz [hydrochlorothiazide], Amoxicillin-pot clavulanate, Lisinopril-hydrochlorothiazide, Sulfa antibiotics, and Sulfonamide derivatives  Family History  Problem Relation Age of Onset   Heart Problems Mother     Social History Social History   Tobacco Use   Smoking status: Former    Years: 13.00    Types: Cigarettes    Quit date: 04/15/1976    Years since quitting: 45.4   Smokeless tobacco: Never  Vaping Use   Vaping Use: Never used  Substance Use Topics   Alcohol use: No   Drug use: No    Review of Systems  Level 5 caveat: AMS  ____________________________________________   PHYSICAL EXAM:  VITAL SIGNS: ED Triage Vitals  Enc Vitals Group     BP 09/25/21 1326 (!) 109/59     Pulse Rate 09/25/21 1324 64     Resp 09/25/21 1325 16     Temp 09/25/21 1353 (!) 96 F (35.6 C)     Temp Source 09/25/21 1353 Rectal     SpO2 09/25/21 1324 95 %     Weight 09/25/21 1316 97 lb (44 kg)     Height 09/25/21 1316 5\' 1"  (1.549 m)   Constitutional: Sleeping but awakens to voice/touch but not agitated.  Repetitively telling me that she is cold.  Eyes: Conjunctivae are normal. PERRL. Head: Atraumatic. Nose: No congestion/rhinnorhea. Mouth/Throat: Mucous membranes are dry.  Neck: No stridor.  Cardiovascular: Normal rate, regular rhythm. Good peripheral circulation. Grossly normal heart sounds.   Respiratory: Normal respiratory effort.  No retractions. Lungs CTAB. Gastrointestinal: Soft and nontender. No distention.  Musculoskeletal: Right arm held flexed against the abdomen and very hesitant to move this area.  No swelling/deformity.  Pain with range of motion of the left upper extremity as well as the bilateral lower extremities but patient able to complete full range of motion.  Neurologic: Speech is repetitive but seems to be vaguely answering my questions at times.  She is not moving the right upper  extremity but this seems to be related to pain rather than specific deficit.  She is spontaneously moving all other extremities. No facial droop.  Skin:  Skin is warm, dry and intact. No rash noted.  ____________________________________________   LABS (all labs ordered are listed, but only abnormal results are displayed)  Labs Reviewed  URINE CULTURE - Abnormal; Notable for the following components:      Result Value   Culture 30,000 COLONIES/mL ESCHERICHIA COLI (*)    Organism ID, Bacteria ESCHERICHIA COLI (*)    All other components within normal limits  COMPREHENSIVE METABOLIC PANEL - Abnormal; Notable for the following components:   Glucose, Bld 140 (*)    Total Bilirubin 0.2 (*)    All other components within normal limits  CBC WITH DIFFERENTIAL/PLATELET - Abnormal; Notable for the following components:   WBC 13.2 (*)    Neutro Abs 11.1 (*)    All other components within normal limits  LACTIC  ACID, PLASMA - Abnormal; Notable for the following components:   Lactic Acid, Venous 2.2 (*)    All other components within normal limits  URINALYSIS, ROUTINE W REFLEX MICROSCOPIC - Abnormal; Notable for the following components:   APPearance CLOUDY (*)    Hgb urine dipstick SMALL (*)    Protein, ur 30 (*)    Nitrite POSITIVE (*)    Leukocytes,Ua LARGE (*)    WBC, UA >50 (*)    Bacteria, UA RARE (*)    Non Squamous Epithelial 0-5 (*)    All other components within normal limits  COMPREHENSIVE METABOLIC PANEL - Abnormal; Notable for the following components:   Chloride 113 (*)    Creatinine, Ser 0.43 (*)    Calcium 8.8 (*)    Total Protein 5.1 (*)    Albumin 2.9 (*)    All other components within normal limits  CBC WITH DIFFERENTIAL/PLATELET - Abnormal; Notable for the following components:   RBC 3.54 (*)    Hemoglobin 10.7 (*)    HCT 35.2 (*)    All other components within normal limits  COMPREHENSIVE METABOLIC PANEL - Abnormal; Notable for the following components:    Chloride 113 (*)    Total Protein 5.6 (*)    Albumin 3.1 (*)    All other components within normal limits  CBC WITH DIFFERENTIAL/PLATELET - Abnormal; Notable for the following components:   Hemoglobin 11.5 (*)    All other components within normal limits  CULTURE, BLOOD (ROUTINE X 2)  CULTURE, BLOOD (ROUTINE X 2)  RESP PANEL BY RT-PCR (FLU A&B, COVID) ARPGX2  LIPASE, BLOOD  LACTIC ACID, PLASMA  PROTIME-INR  MAGNESIUM  PHOSPHORUS  MAGNESIUM  PHOSPHORUS  CBC  CBC WITH DIFFERENTIAL/PLATELET  TROPONIN I (HIGH SENSITIVITY)  TROPONIN I (HIGH SENSITIVITY)   ____________________________________________  EKG  EKG reviewed. No ST elevation/depression. Artifact noted. Narrow QRS.    ____________________________________________  RADIOLOGY  CXR reviewed. CT head negative.   ____________________________________________   PROCEDURES  Procedure(s) performed:   Procedures  CRITICAL CARE Performed by: Margette Fast Total critical care time: 35 minutes Critical care time was exclusive of separately billable procedures and treating other patients. Critical care was necessary to treat or prevent imminent or life-threatening deterioration. Critical care was time spent personally by me on the following activities: development of treatment plan with patient and/or surrogate as well as nursing, discussions with consultants, evaluation of patient's response to treatment, examination of patient, obtaining history from patient or surrogate, ordering and performing treatments and interventions, ordering and review of laboratory studies, ordering and review of radiographic studies, pulse oximetry and re-evaluation of patient's condition.  Nanda Quinton, MD Emergency Medicine  ____________________________________________   INITIAL IMPRESSION / ASSESSMENT AND PLAN / ED COURSE  Pertinent labs & imaging results that were available during my care of the patient were reviewed by me and  considered in my medical decision making (see chart for details).   Patient presents to the emergency department with acute onset altered mental status.  No focal deficits.  She does appear to have some right arm pain with passive range of motion.  She has mild hypothermia here with 96 degree rectal temp on arrival.  No tachycardia or hypotension.  Differential is broad at this time and includes sepsis although no clear infection source is apparent on my initial exam.  Will obtain screening EKG and give IV fluids.  Will obtain CT imaging of the head.  Labs and vitals consistent with sepsis. No clear  infection source. CT head reviewed but official read pending. Care transferred to Dr. Tomi Bamberger who will follow CT read and admit.    I reviewed all nursing notes, vitals, pertinent old records, EKGs, labs, imaging (as available).  ____________________________________________  FINAL CLINICAL IMPRESSION(S) / ED DIAGNOSES  Final diagnoses:  AMS (altered mental status)     MEDICATIONS GIVEN DURING THIS VISIT:  Medications  0.9 %  sodium chloride infusion ( Intravenous New Bag/Given 09/26/21 0500)  aspirin chewable tablet 81 mg (81 mg Oral Given 09/27/21 1107)  HYDROcodone-acetaminophen (NORCO/VICODIN) 5-325 MG per tablet 1 tablet (1 tablet Oral Given 09/27/21 1341)  amLODipine (NORVASC) tablet 2.5 mg (2.5 mg Oral Given 09/27/21 1106)  atorvastatin (LIPITOR) tablet 40 mg (40 mg Oral Given 09/27/21 1106)  buPROPion (WELLBUTRIN) tablet 75 mg (75 mg Oral Given 09/27/21 1108)  donepezil (ARICEPT) tablet 5 mg (5 mg Oral Given 09/27/21 1107)  mirtazapine (REMERON) tablet 7.5 mg (7.5 mg Oral Given 09/26/21 2155)  clopidogrel (PLAVIX) tablet 75 mg (75 mg Oral Given 09/27/21 1106)  gabapentin (NEURONTIN) capsule 100 mg (100 mg Oral Given 09/27/21 1552)  cholecalciferol (VITAMIN D3) tablet 1,000 Units (1,000 Units Oral Given 09/27/21 1107)  multivitamin with minerals tablet 1 tablet (1 tablet Oral Given 09/27/21 1106)   sodium chloride flush (NS) 0.9 % injection 3 mL (0 mLs Intravenous Duplicate 05/26/40 3244)  sodium chloride flush (NS) 0.9 % injection 3 mL (3 mLs Intravenous Given 09/27/21 1109)  sodium chloride flush (NS) 0.9 % injection 3 mL (has no administration in time range)  0.9 %  sodium chloride infusion (has no administration in time range)  acetaminophen (TYLENOL) tablet 650 mg (has no administration in time range)    Or  acetaminophen (TYLENOL) suppository 650 mg (has no administration in time range)  traZODone (DESYREL) tablet 50 mg (has no administration in time range)  polyethylene glycol (MIRALAX / GLYCOLAX) packet 17 g (has no administration in time range)  bisacodyl (DULCOLAX) suppository 10 mg (has no administration in time range)  ondansetron (ZOFRAN) tablet 4 mg (has no administration in time range)    Or  ondansetron (ZOFRAN) injection 4 mg (has no administration in time range)  enoxaparin (LOVENOX) injection 30 mg (30 mg Subcutaneous Given 09/26/21 2156)  feeding supplement (ENSURE ENLIVE / ENSURE PLUS) liquid 237 mL (237 mLs Oral Not Given 09/27/21 1348)  sodium chloride 0.9 % bolus 500 mL (0 mLs Intravenous Stopped 09/25/21 1640)  metroNIDAZOLE (FLAGYL) IVPB 500 mg (0 mg Intravenous Stopped 09/25/21 1758)  vancomycin (VANCOCIN) IVPB 1000 mg/200 mL premix (0 mg Intravenous Stopped 09/25/21 1834)  sodium chloride 0.9 % bolus 1,000 mL (0 mLs Intravenous Stopped 09/25/21 1835)  ceFEPIme (MAXIPIME) 2 g in sodium chloride 0.9 % 100 mL IVPB (0 g Intravenous Stopped 09/25/21 1641)  magnesium sulfate IVPB 2 g 50 mL (0 g Intravenous Stopped 09/27/21 1000)  cefTRIAXone (ROCEPHIN) 1 g in sodium chloride 0.9 % 100 mL IVPB (1 g Intravenous New Bag/Given 09/27/21 1249)     NEW OUTPATIENT MEDICATIONS STARTED DURING THIS VISIT:  Discharge Medication List as of 09/28/2021  5:20 AM     START taking these medications   Details  !! feeding supplement (ENSURE ENLIVE / ENSURE PLUS) LIQD Take 237 mLs by  mouth 2 (two) times daily between meals., Starting Thu 09/27/2021, Normal    polyethylene glycol (MIRALAX / GLYCOLAX) 17 g packet Take 17 g by mouth daily as needed for mild constipation., Starting Thu 09/27/2021, Normal    traZODone (DESYREL) 50  MG tablet Take 1 tablet (50 mg total) by mouth at bedtime as needed for sleep., Starting Thu 09/27/2021, Normal     !! - Potential duplicate medications found. Please discuss with provider.      Note:  This document was prepared using Dragon voice recognition software and may include unintentional dictation errors.  Nanda Quinton, MD, [facility] Emergency Medicine    Hawkins Seaman, [facility], MD 09/28/21 [phone]

## 2021-09-25 NOTE — Progress Notes (Signed)
Pharmacy Antibiotic Note  Angelica Bell is a 87 y.o. female admitted on 09/25/2021 with  unknown source of infection .  Pharmacy has been consulted for Vancomycin and Cefepime dosing.  Plan: Vancomycin 1000 mg IV x 1 dose. Vancomycin 500 mg IV every 24 hours. Cefepime 2000 mg IV every 12 hours. Monitor labs, c/s, and vanco level as indicated.  Height: 5\' 1"  (154.9 cm) Weight: 44 kg (97 lb) IBW/kg (Calculated) : 47.8  Temp (24hrs), Avg:96 F (35.6 C), Min:96 F (35.6 C), Max:96 F (35.6 C)  Recent Labs  Lab 09/25/21 1430  WBC 13.2*  CREATININE 0.45  LATICACIDVEN 2.2*    Estimated Creatinine Clearance: 34.4 mL/min (by C-G formula based on SCr of 0.45 mg/dL).    Allergies  Allergen Reactions   Hctz [Hydrochlorothiazide]     Significant and rapid hyponatremia (TIH)   Amoxicillin-Pot Clavulanate Hives and Diarrhea    Has patient had a PCN reaction causing immediate rash, facial/tongue/throat swelling, SOB or lightheadedness with hypotension: yes Has patient had a PCN reaction causing severe rash involving mucus membranes or skin necrosis: yes Has patient had a PCN reaction that required hospitalization : unknown Has patient had a PCN reaction occurring within the last 10 years: unknown If all of the above answers are "NO", then may proceed with Cephalosporin use.    Lisinopril-Hydrochlorothiazide Other (See Comments)    Tired- no energy to do anything..   Sulfa Antibiotics Hives and Nausea And Vomiting   Sulfonamide Derivatives Hives    Antimicrobials this admission: Vanco 9/27 >>  Cefepime 9/27 >>  Flagyl 9/27  Microbiology results: 9/27 BCx: pending 9/27 UCx: pending    Thank you for allowing pharmacy to be a part of this patient's care.  Ramond Craver 09/25/2021 3:42 PM

## 2021-09-25 NOTE — ED Notes (Signed)
Attempted to call report. Per Charge RN bed not assigned

## 2021-09-25 NOTE — H&P (Signed)
Patient Demographics:    Angelica Bell, is a 87 y.o. female  MRN: [id number]   DOB - 09-23-1932  Admit Date - 09/25/2021  Outpatient Primary MD for the patient is System, Provider Not In   Assessment & Plan:    Principal Problem:   Acute metabolic encephalopathy Active Problems:   Essential hypertension   Failure to thrive in adult   Acute lower UTI--POA   1) acute metabolic encephalopathy secondary to UTI--POA --Per staff at nursing home Pt became more lethargic throughout the day and they thought she was unresponsive--no frank syncope per se -IV Rocephin and IV fluids pending final culture data  2)HTN--continue amlodipine  3) dementia--with behavioral disturbance, continue Remeron, Aricept and bupropion  4) history of prior stroke----continue aspirin, Plavix and Lipitor for secondary stroke prophylaxis  5)Sepsis secondary to UTI-----POA-- -Patient met  sepsis criteria on admission with leukocytosis tachycardia tachypnea and altered mentation with elevated lactic acid levels management as above #1 --Initial lactic acid two-point 2 repeat after fluids 1.3  Disposition/Need for in-Hospital Stay- patient unable to be discharged at this time due to --sepsis secondary to UTI with altered mentation requiring IV fluids and IV antibiotic*  Status is: Inpatient  Remains inpatient appropriate because: Please see disposition above  Dispo: The patient is from: SNF              Anticipated d/c is to: SNF              Anticipated d/c date is: 3 days              Patient currently is not medically stable to d/c. Barriers: Not Clinically Stable-    With History of - Reviewed by me  Past Medical History:  Diagnosis Date   Chronic back pain    with degenerative joint disease   Chronic pain disorder    with  narcotic management   Dry eyes    Frequent falls    HOH (hard of hearing)    Hypertension    Multiple rib fractures 02/2020   Osteoporosis    PE (pulmonary embolism)    After hysterectomy in 1967   Pneumonia    Rhabdomyolysis    Stroke [facility])       Past Surgical History:  Procedure Laterality Date   ABDOMINAL HYSTERECTOMY     APPENDECTOMY     BREAST ENHANCEMENT SURGERY     CATARACT EXTRACTION     FEMUR IM NAIL Left 02/17/2017   Procedure: INTRAMEDULLARY (IM) NAIL FEMORAL;  Surgeon: Nicholes Stairs, MD;  Location: [place];  Service: Orthopedics;  Laterality: Left;   HIP ARTHROPLASTY Right 12/24/2015   Procedure: ARTHROPLASTY BIPOLAR HIP (HEMIARTHROPLASTY);  Surgeon: Netta Cedars, MD;  Location: [facility];  Service: Orthopedics;  Laterality: Right;   TONSILLECTOMY        Chief Complaint  Patient presents with   Altered Mental Status      HPI:  Angelica Bell  is a 87 y.o. female reformed smoker with past medical history relevant for history of prior stroke, HTN, hearing loss and dementia who presents from nursing facility after an episode of unresponsiveness -No CPR was administered----patient apparently came around by herself and then became combative and agitated -Per staff became more lethargic throughout the day and they thought she was unresponsive--no frank syncope per se -Patient is a very poor historian due to underlying dementia -Patient is uncooperative with EKG with lots of artifacts--not sure if atrial flutter -COVID test is negative UA strongly suggestive of UTI -Initial lactic acid two-point 2 repeat after fluids 1.3 -Troponin is not elevated WBC is 13.2 -Creatinine 0.45 LFTs are not elevated -   Review of systems:    In addition to the HPI above,   A full Review of  Systems was done, all other systems reviewed are negative except as noted above in HPI , .    Social History:  Reviewed by me    Social History   Tobacco Use   Smoking status:  Former    Years: 13.00    Types: Cigarettes    Quit date: 04/15/1976    Years since quitting: 45.4   Smokeless tobacco: Never  Substance Use Topics   Alcohol use: No     Family History :  Reviewed by me    Family History  Problem Relation Age of Onset   Heart Problems Mother      Home Medications:   Prior to Admission medications   Medication Sig Start Date End Date Taking? Authorizing Provider  acetaminophen (TYLENOL) 500 MG tablet Take 500 mg by mouth every 6 (six) hours as needed for moderate pain.     [provider]  amLODipine (NORVASC) 2.5 MG tablet Take 1 tablet (2.5 mg total) by mouth daily. 05/18/20   Charlynne Cousins, MD  aspirin EC 81 MG EC tablet Take 1 tablet (81 mg total) by mouth daily. 09/15/19   Matcha, Beverely Pace, MD  atorvastatin (LIPITOR) 40 MG tablet Take 1 tablet (40 mg total) by mouth daily. 09/05/20   Shelly Coss, MD  buPROPion (WELLBUTRIN) 75 MG tablet Take 75 mg by mouth daily.    [provider]  cholecalciferol (VITAMIN D) 25 MCG tablet Take 1 tablet (1,000 Units total) by mouth daily. 09/05/20   Shelly Coss, MD  clopidogrel (PLAVIX) 75 MG tablet Take 1 tablet (75 mg total) by mouth daily. Stop after 25 days 09/06/20   Shelly Coss, MD  donepezil (ARICEPT) 5 MG tablet Take 5 mg by mouth daily. 06/28/20   [provider]  gabapentin (NEURONTIN) 100 MG capsule Take 100 mg by mouth 3 (three) times daily. For nerve pain.    [provider]  HYDROcodone-acetaminophen (NORCO/VICODIN) 5-325 MG tablet Take 1 tablet by mouth every 6 (six) hours as needed. 11/07/20   Milton Ferguson, MD  HYDROcodone-acetaminophen (NORCO/VICODIN) 5-325 MG tablet Take 1 tablet by mouth every 6 (six) hours as needed for moderate pain.    [provider]  mirtazapine (REMERON) 7.5 MG tablet Take 1 tablet (7.5 mg total) by mouth at bedtime. 10/13/20   Barton Dubois, MD  Multiple Vitamin (MULTIVITAMIN WITH MINERALS) TABS tablet Take 1  tablet by mouth daily. 03/11/20   Black, Lezlie Octave, NP  Nutritional Supplements (RESOURCE 2.0) LIQD Take 60 mLs by mouth in the morning, at noon, and at bedtime.    [provider]  predniSONE (DELTASONE) 10 MG tablet Take 2 tablets (20  mg total) by mouth daily. 11/07/20   Milton Ferguson, MD  predniSONE (DELTASONE) 20 MG tablet Take 40 mg by mouth daily. For right arm swelling for 7 days    [provider]     Allergies:     Allergies  Allergen Reactions   Hctz [Hydrochlorothiazide]     Significant and rapid hyponatremia (TIH)   Amoxicillin-Pot Clavulanate Hives and Diarrhea    Has patient had a PCN reaction causing immediate rash, facial/tongue/throat swelling, SOB or lightheadedness with hypotension: yes Has patient had a PCN reaction causing severe rash involving mucus membranes or skin necrosis: yes Has patient had a PCN reaction that required hospitalization : unknown Has patient had a PCN reaction occurring within the last 10 years: unknown If all of the above answers are "NO", then may proceed with Cephalosporin use.    Lisinopril-Hydrochlorothiazide Other (See Comments)    Tired- no energy to do anything..   Sulfa Antibiotics Hives and Nausea And Vomiting   Sulfonamide Derivatives Hives     Physical Exam:   Vitals  Blood pressure (!) 150/71, pulse 72, temperature 97.8 F (36.6 C), temperature source Oral, resp. rate (!) 25, height _0  (1.549 m), weight 44 kg, SpO2 100 %.  Physical Examination: General appearance - alert, frail appearing, and in no distress  Mental status ---confused disoriented , agitated eyes - sclera anicteric Neck - supple, no JVD elevation , Chest - clear  to auscultation bilaterally, symmetrical air movement,  Heart - S1 and S2 normal, regular  Abdomen - soft, nontender, nondistended, no masses or organomegaly Neurological - chronic neuromuscular deficits--pt is not cooperative with exam--she is screaming and yelling -resisting  exam--so Neuro exam is Limited Extremities - no pedal edema noted, intact peripheral pulses  Skin - warm, dry     Data Review:    CBC Recent Labs  Lab 09/25/21 1430  WBC 13.2*  HGB 13.1  HCT 41.9  PLT 343  MCV 94.6  MCH 29.6  MCHC 31.3  RDW 13.1  LYMPHSABS 1.0  MONOABS 0.6  EOSABS 0.3  BASOSABS 0.1   ------------------------------------------------------------------------------------------------------------------  Chemistries  Recent Labs  Lab 09/25/21 1430  NA 140  K 3.6  CL 104  CO2 28  GLUCOSE 140*  BUN 18  CREATININE 0.45  CALCIUM 9.9  AST 21  ALT 11  ALKPHOS 120  BILITOT 0.2*   ------------------------------------------------------------------------------------------------------------------ estimated creatinine clearance is 34.4 mL/min (by C-G formula based on SCr of 0.45 mg/dL). ------------------------------------------------------------------------------------------------------------------ No results for input(s): TSH, T4TOTAL, T3FREE, THYROIDAB in the last 72 hours.  Invalid input(s): FREET3   Coagulation profile Recent Labs  Lab 09/25/21 1430  INR 1.0   ------------------------------------------------------------------------------------------------------------------- No results for input(s): DDIMER in the last 72 hours. -------------------------------------------------------------------------------------------------------------------  Cardiac Enzymes No results for input(s): CKMB, TROPONINI, MYOGLOBIN in the last 168 hours.  Invalid input(s): CK ------------------------------------------------------------------------------------------------------------------ No results found for: BNP   ---------------------------------------------------------------------------------------------------------------  Urinalysis    Component Value Date/Time   COLORURINE YELLOW 09/25/2021 1700   APPEARANCEUR CLOUDY (A) 09/25/2021 1700   LABSPEC 1.016  09/25/2021 1700   PHURINE 6.0 09/25/2021 1700   GLUCOSEU NEGATIVE 09/25/2021 1700   HGBUR SMALL (A) 09/25/2021 1700   HGBUR negative 04/24/2009 1635   BILIRUBINUR NEGATIVE 09/25/2021 1700   BILIRUBINUR negative 05/29/2011 1439   KETONESUR NEGATIVE 09/25/2021 1700   PROTEINUR 30 (A) 09/25/2021 1700   UROBILINOGEN 0.2 01/26/2014 1912   NITRITE POSITIVE (A) 09/25/2021 1700   LEUKOCYTESUR LARGE (A) 09/25/2021 1700    ----------------------------------------------------------------------------------------------------------------  Imaging Results:    DG Chest 1 View  Result Date: 09/25/2021 CLINICAL DATA:  Shortness of breath. EXAM: CHEST  1 VIEW COMPARISON:  08/29/2020 FINDINGS: The cardiac silhouette, mediastinal and hilar contours are within normal limits and stable. Mild tortuosity and calcification the thoracic aorta. No acute pulmonary findings. No pulmonary lesions or pleural effusions. The bony thorax is intact. Remote rib fractures and remote left humeral head and neck fractures. IMPRESSION: No acute cardiopulmonary findings. Electronically Signed   By: Marijo Sanes M.D.   On: 09/25/2021 15:36   DG Shoulder Right  Result Date: 09/25/2021 CLINICAL DATA:  Right shoulder pain. EXAM: RIGHT SHOULDER - 2+ VIEW COMPARISON:  None. FINDINGS: Mild glenohumeral and AC joint degenerative changes but no acute fracture or dislocation. The visualized right ribs are grossly intact. IMPRESSION: Mild degenerative changes but no acute bony findings. Electronically Signed   By: Marijo Sanes M.D.   On: 09/25/2021 15:37   DG Elbow Complete Right  Result Date: 09/25/2021 CLINICAL DATA:  Right elbow pain. EXAM: RIGHT ELBOW - COMPLETE 3+ VIEW COMPARISON:  11/07/2020 FINDINGS: Limited exam due to positioning. Advanced/aggressive pattern of osteoporosis. No fracture or dislocation is noted on the lateral film. No obvious joint effusion. IMPRESSION: 1. Advanced/aggressive pattern of osteoporosis. 2. No acute  bony findings. Electronically Signed   By: Marijo Sanes M.D.   On: 09/25/2021 15:39   CT HEAD WO CONTRAST (5MM)  Result Date: 09/25/2021 CLINICAL DATA:  Mental status change, unknown cause EXAM: CT HEAD WITHOUT CONTRAST TECHNIQUE: Contiguous axial images were obtained from the base of the skull through the vertex without intravenous contrast. COMPARISON:  CT head 10/10/2020, CT head 05/12/2020 BRAIN: BRAIN Cerebral ventricle sizes are concordant with the degree of cerebral volume loss. Patchy and confluent areas of decreased attenuation are noted throughout the deep and periventricular white matter of the cerebral hemispheres bilaterally, compatible with chronic microvascular ischemic disease. Chronic left frontoparietal white matter infarction. No evidence of large-territorial acute infarction. No parenchymal hemorrhage. No mass lesion. No extra-axial collection. No mass effect or midline shift. No hydrocephalus. Basilar cisterns are patent. Vascular: No hyperdense vessel. Atherosclerotic calcifications are present within the cavernous internal carotid arteries. Skull: No acute fracture or focal lesion. Sinuses/Orbits: Paranasal sinuses and mastoid air cells are clear. Bilateral lens replacement. Otherwise orbits are unremarkable. Other: None. IMPRESSION: No acute intracranial abnormality. Electronically Signed   By: Iven Finn M.D.   On: 09/25/2021 16:03    Radiological Exams on Admission: DG Chest 1 View  Result Date: 09/25/2021 CLINICAL DATA:  Shortness of breath. EXAM: CHEST  1 VIEW COMPARISON:  08/29/2020 FINDINGS: The cardiac silhouette, mediastinal and hilar contours are within normal limits and stable. Mild tortuosity and calcification the thoracic aorta. No acute pulmonary findings. No pulmonary lesions or pleural effusions. The bony thorax is intact. Remote rib fractures and remote left humeral head and neck fractures. IMPRESSION: No acute cardiopulmonary findings. Electronically Signed    By: Marijo Sanes M.D.   On: 09/25/2021 15:36   DG Shoulder Right  Result Date: 09/25/2021 CLINICAL DATA:  Right shoulder pain. EXAM: RIGHT SHOULDER - 2+ VIEW COMPARISON:  None. FINDINGS: Mild glenohumeral and AC joint degenerative changes but no acute fracture or dislocation. The visualized right ribs are grossly intact. IMPRESSION: Mild degenerative changes but no acute bony findings. Electronically Signed   By: Marijo Sanes M.D.   On: 09/25/2021 15:37   DG Elbow Complete Right  Result Date: 09/25/2021 CLINICAL DATA:  Right elbow pain. EXAM:  RIGHT ELBOW - COMPLETE 3+ VIEW COMPARISON:  11/07/2020 FINDINGS: Limited exam due to positioning. Advanced/aggressive pattern of osteoporosis. No fracture or dislocation is noted on the lateral film. No obvious joint effusion. IMPRESSION: 1. Advanced/aggressive pattern of osteoporosis. 2. No acute bony findings. Electronically Signed   By: Marijo Sanes M.D.   On: 09/25/2021 15:39   CT HEAD WO CONTRAST (5MM)  Result Date: 09/25/2021 CLINICAL DATA:  Mental status change, unknown cause EXAM: CT HEAD WITHOUT CONTRAST TECHNIQUE: Contiguous axial images were obtained from the base of the skull through the vertex without intravenous contrast. COMPARISON:  CT head 10/10/2020, CT head 05/12/2020 BRAIN: BRAIN Cerebral ventricle sizes are concordant with the degree of cerebral volume loss. Patchy and confluent areas of decreased attenuation are noted throughout the deep and periventricular white matter of the cerebral hemispheres bilaterally, compatible with chronic microvascular ischemic disease. Chronic left frontoparietal white matter infarction. No evidence of large-territorial acute infarction. No parenchymal hemorrhage. No mass lesion. No extra-axial collection. No mass effect or midline shift. No hydrocephalus. Basilar cisterns are patent. Vascular: No hyperdense vessel. Atherosclerotic calcifications are present within the cavernous internal carotid arteries. Skull:  No acute fracture or focal lesion. Sinuses/Orbits: Paranasal sinuses and mastoid air cells are clear. Bilateral lens replacement. Otherwise orbits are unremarkable. Other: None. IMPRESSION: No acute intracranial abnormality. Electronically Signed   By: Iven Finn M.D.   On: 09/25/2021 16:03    DVT Prophylaxis -SCD/Lovenox AM Labs Ordered, also please review Full Orders  Family Communication: Admission, patients condition and plan of care including tests being ordered have been discussed with the patient  who indicate understanding and agree with the plan   Code Status - Full Code  Likely DC to SNF  Condition   Stable  Roxan Hockey M.D on 09/25/2021 at 8:10 PM Go to [email or web address] -  for contact info  [facility] - Office  [phone]

## 2021-09-25 NOTE — ED Provider Notes (Signed)
Patient initially seen by Dr. Laverta [place].  Please see his note.  Presentation is concerning for infection.  Plan was to follow-up on head CT results and anticipate admission for possible infection.   Dorie Rank, MD 09/25/21 [phone]

## 2021-09-25 NOTE — ED Triage Notes (Addendum)
Pt arrived via [facility] from [place]. EMS was initially called out for CPR in progress. Per nursing staff at [facility] CPR was never started because patient was alert. Pt was diaphoretic per nursing staff so 1 nitro was given. No other complaints. Pt w/ hx of dementia. Pt moans when you touch her right arm.  EMS states pt had multiple fits of anger while in route. They fell she was sent here for her anxiety / behavioral issues.

## 2021-09-26 DIAGNOSIS — I10 Essential (primary) hypertension: Secondary | ICD-10-CM

## 2021-09-26 DIAGNOSIS — R4182 Altered mental status, unspecified: Secondary | ICD-10-CM

## 2021-09-26 DIAGNOSIS — R627 Adult failure to thrive: Secondary | ICD-10-CM

## 2021-09-26 DIAGNOSIS — N390 Urinary tract infection, site not specified: Secondary | ICD-10-CM

## 2021-09-26 DIAGNOSIS — G9341 Metabolic encephalopathy: Secondary | ICD-10-CM | POA: Diagnosis not present

## 2021-09-26 LAB — CBC WITH DIFFERENTIAL/PLATELET
Abs Immature Granulocytes: 0.02 10*3/uL (ref 0.00–0.07)
Basophils Absolute: 0.1 10*3/uL (ref 0.0–0.1)
Basophils Relative: 1 %
Eosinophils Absolute: 0.4 10*3/uL (ref 0.0–0.5)
Eosinophils Relative: 6 %
HCT: 35.2 % — ABNORMAL LOW (ref 36.0–46.0)
Hemoglobin: 10.7 g/dL — ABNORMAL LOW (ref 12.0–15.0)
Immature Granulocytes: 0 %
Lymphocytes Relative: 29 %
Lymphs Abs: 2 10*3/uL (ref 0.7–4.0)
MCH: 30.2 pg (ref 26.0–34.0)
MCHC: 30.4 g/dL (ref 30.0–36.0)
MCV: 99.4 fL (ref 80.0–100.0)
Monocytes Absolute: 0.5 10*3/uL (ref 0.1–1.0)
Monocytes Relative: 8 %
Neutro Abs: 3.8 10*3/uL (ref 1.7–7.7)
Neutrophils Relative %: 56 %
Platelets: 320 10*3/uL (ref 150–400)
RBC: 3.54 MIL/uL — ABNORMAL LOW (ref 3.87–5.11)
RDW: 13.3 % (ref 11.5–15.5)
WBC: 6.8 10*3/uL (ref 4.0–10.5)
nRBC: 0 % (ref 0.0–0.2)

## 2021-09-26 LAB — COMPREHENSIVE METABOLIC PANEL
ALT: 11 U/L (ref 0–44)
AST: 19 U/L (ref 15–41)
Albumin: 2.9 g/dL — ABNORMAL LOW (ref 3.5–5.0)
Alkaline Phosphatase: 95 U/L (ref 38–126)
Anion gap: 6 (ref 5–15)
BUN: 14 mg/dL (ref 8–23)
CO2: 24 mmol/L (ref 22–32)
Calcium: 8.8 mg/dL — ABNORMAL LOW (ref 8.9–10.3)
Chloride: 113 mmol/L — ABNORMAL HIGH (ref 98–111)
Creatinine, Ser: 0.43 mg/dL — ABNORMAL LOW (ref 0.44–1.00)
GFR, Estimated: >60 mL/min (ref >60)
Glucose, Bld: 84 mg/dL (ref 70–99)
Potassium: 3.6 mmol/L (ref 3.5–5.1)
Sodium: 143 mmol/L (ref 135–145)
Total Bilirubin: 0.3 mg/dL (ref 0.3–1.2)
Total Protein: 5.1 g/dL — ABNORMAL LOW (ref 6.5–8.1)

## 2021-09-26 LAB — PHOSPHORUS: Phosphorus: 3.4 mg/dL (ref 2.5–4.6)

## 2021-09-26 LAB — MAGNESIUM: Magnesium: 1.8 mg/dL (ref 1.7–2.4)

## 2021-09-26 NOTE — Progress Notes (Signed)
PROGRESS NOTE    Angelica Bell  CLE:[id number] DOB: 07/18/1932 DOA: 09/25/2021 PCP: System, Provider Not In   Brief Narrative:   87 y.o. WF PMHx prior stroke, HTN, hearing loss and dementia   Presents from nursing facility after an episode of unresponsiveness -No CPR was administered----patient apparently came around by herself and then became combative and agitated -Per staff became more lethargic throughout the day and they thought she was unresponsive--no frank syncope per se -Patient is a very poor historian due to underlying dementia -Patient is uncooperative with EKG with lots of artifacts--not sure if atrial flutter -COVID test is negative UA strongly suggestive of UTI -Initial lactic acid 2.2 repeat after fluids 1.3 -Troponin is not elevated WBC is 13.2 -Creatinine 0.45 LFTs are not elevated   Subjective: Afebrile overnight A/O x3 (does not know why).  Very anxious   Assessment & Plan:  Covid vaccination;  Principal Problem:   Acute metabolic encephalopathy Active Problems:   Essential hypertension   Failure to thrive in adult   Acute lower UTI--POA   Sepsis secondary to UTI- -Patient met  sepsis criteria on admission with leukocytosis tachycardia tachypnea and altered mentation with elevated lactic acid levels management as above #1 --Initial lactic acid two-point 2 repeat after fluids 1.3  Acute metabolic encephalopathy secondary to UTI --Per staff at nursing home Pt became more lethargic throughout the day and they thought she was unresponsive--no frank syncope per se -IV Rocephin and IV fluids pending final culture data -Resolved most likely at baseline.  Dementia with behavioral disturbance --continue Remeron, Aricept and bupropion  Hx of stroke ----continue aspirin, Plavix and Lipitor for secondary stroke prophylaxis   Essential HTN --continue amlodipine          Disposition/Need for in-Hospital Stay- patient unable to be discharged at this  time due to --sepsis secondary to UTI with altered mentation requiring IV fluids and IV antibiotic*   DVT prophylaxis:  Code Status: Full Family Communication:  Status is: Inpatient    Dispo: The patient is from:               Anticipated d/c is to:               Anticipated d/c date is:               Patient currently is not medically stable to d/c.      Consultants:    Procedures/Significant Events:     I have personally reviewed and interpreted all radiology studies and my findings are as above.  VENTILATOR SETTINGS:    Cultures   Antimicrobials: Anti-infectives (From admission, onward)    Start     Dose/Rate Route Frequency Ordered Stop   09/26/21 1800  vancomycin (VANCOREADY) IVPB 500 mg/100 mL  Status:  Discontinued        500 mg 100 mL/hr over 60 Minutes Intravenous Every 24 hours 09/25/21 1542 09/25/21 1828   09/26/21 0600  ceFEPIme (MAXIPIME) 2 g in sodium chloride 0.9 % 100 mL IVPB  Status:  Discontinued        2 g 200 mL/hr over 30 Minutes Intravenous Every 12 hours 09/25/21 1540 09/25/21 1828   09/25/21 1900  cefTRIAXone (ROCEPHIN) 1 g in sodium chloride 0.9 % 100 mL IVPB        1 g 200 mL/hr over 30 Minutes Intravenous Every 24 hours 09/25/21 1828     09/25/21 1530  aztreonam (AZACTAM) 2 g in sodium chloride 0.9 % 100 mL  IVPB  Status:  Discontinued        2 g 200 mL/hr over 30 Minutes Intravenous  Once 09/25/21 1521 09/25/21 1524   09/25/21 1530  metroNIDAZOLE (FLAGYL) IVPB 500 mg        500 mg 100 mL/hr over 60 Minutes Intravenous  Once 09/25/21 1521 09/25/21 1758   09/25/21 1530  vancomycin (VANCOCIN) IVPB 1000 mg/200 mL premix        1,000 mg 200 mL/hr over 60 Minutes Intravenous  Once 09/25/21 1521 09/25/21 1834   09/25/21 1530  ceFEPIme (MAXIPIME) 2 g in sodium chloride 0.9 % 100 mL IVPB        2 g 200 mL/hr over 30 Minutes Intravenous  Once 09/25/21 1524 09/25/21 1641         Devices    LINES / TUBES:      Continuous  Infusions:  sodium chloride 100 mL/hr at 09/26/21 0500   sodium chloride     cefTRIAXone (ROCEPHIN)  IV 1 g (09/25/21 1935)     Objective: Vitals:   09/25/21 1936 09/25/21 2029 09/26/21 0057 09/26/21 0451  BP:  (!) 186/95 105/68 115/74  Pulse:  77 83 85  Resp:  _0 Temp: 97.8 F (36.6 C) 98.2 F (36.8 C) 98 F (36.7 C)   TempSrc: Oral  Oral   SpO2:   99% 100%  Weight:      Height:        Intake/Output Summary (Last 24 hours) at 09/26/2021 0752 Last data filed at 09/26/2021 0300 Gross per 24 hour  Intake 3289.37 ml  Output --  Net 3289.37 ml   Filed Weights   09/25/21 1316  Weight: 44 kg    Examination:  General: A/O x3 (does not know why).  Very anxious, No acute respiratory distress Eyes: negative scleral hemorrhage, negative anisocoria, negative icterus ENT: Negative Runny nose, negative gingival bleeding, Neck:  Negative scars, masses, torticollis, lymphadenopathy, JVD Lungs: Clear to auscultation bilaterally without wheezes or crackles Cardiovascular: Regular rate and rhythm without murmur gallop or rub normal S1 and S2 Abdomen: negative abdominal pain, nondistended, positive soft, bowel sounds, no rebound, no ascites, no appreciable mass Extremities: No significant cyanosis, clubbing, or edema bilateral lower extremities Skin: Negative rashes, lesions, ulcers Psychiatric:  Negative depression, positive anxiety, negative fatigue, negative mania  Central nervous system:  Cranial nerves II through XII intact, tongue/uvula midline, all extremities muscle strength 5/5, sensation intact throughout, negative dysarthria, negative expressive aphasia, negative receptive aphasia.  .     Data Reviewed: Care during the described time interval was provided by me .  I have reviewed this patient's available data, including medical history, events of note, physical examination, and all test results as part of my evaluation.   CBC: Recent Labs  Lab 09/25/21 1430  WBC  13.2*  NEUTROABS 11.1*  HGB 13.1  HCT 41.9  MCV 94.6  PLT 063   Basic Metabolic Panel: Recent Labs  Lab 09/25/21 1430  NA 140  K 3.6  CL 104  CO2 28  GLUCOSE 140*  BUN 18  CREATININE 0.45  CALCIUM 9.9   GFR: Estimated Creatinine Clearance: 34.4 mL/min (by C-G formula based on SCr of 0.45 mg/dL). Liver Function Tests: Recent Labs  Lab 09/25/21 1430  AST 21  ALT 11  ALKPHOS 120  BILITOT 0.2*  PROT 6.7  ALBUMIN 3.7   Recent Labs  Lab 09/25/21 1430  LIPASE 22   No results for input(s): AMMONIA in the last  168 hours. Coagulation Profile: Recent Labs  Lab 09/25/21 1430  INR 1.0   Cardiac Enzymes: No results for input(s): CKTOTAL, CKMB, CKMBINDEX, TROPONINI in the last 168 hours. BNP (last 3 results) No results for input(s): PROBNP in the last 8760 hours. HbA1C: No results for input(s): HGBA1C in the last 72 hours. CBG: No results for input(s): GLUCAP in the last 168 hours. Lipid Profile: No results for input(s): CHOL, HDL, LDLCALC, TRIG, CHOLHDL, LDLDIRECT in the last 72 hours. Thyroid Function Tests: No results for input(s): TSH, T4TOTAL, FREET4, T3FREE, THYROIDAB in the last 72 hours. Anemia Panel: No results for input(s): VITAMINB12, FOLATE, FERRITIN, TIBC, IRON, RETICCTPCT in the last 72 hours. Urine analysis:    Component Value Date/Time   COLORURINE YELLOW 09/25/2021 1700   APPEARANCEUR CLOUDY (A) 09/25/2021 1700   LABSPEC 1.016 09/25/2021 1700   PHURINE 6.0 09/25/2021 1700   GLUCOSEU NEGATIVE 09/25/2021 1700   HGBUR SMALL (A) 09/25/2021 1700   HGBUR negative 04/24/2009 1635   BILIRUBINUR NEGATIVE 09/25/2021 1700   BILIRUBINUR negative 05/29/2011 1439   KETONESUR NEGATIVE 09/25/2021 1700   PROTEINUR 30 (A) 09/25/2021 1700   UROBILINOGEN 0.2 01/26/2014 1912   NITRITE POSITIVE (A) 09/25/2021 1700   LEUKOCYTESUR LARGE (A) 09/25/2021 1700   Sepsis Labs: _0 (procalcitonin:4,lacticidven:4)  ) Recent Results (from the past 240 hour(s))   Culture, blood (routine x 2)     Status: None (Preliminary result)   Collection Time: 09/25/21  2:30 PM   Specimen: BLOOD LEFT FOREARM  Result Value Ref Range Status   Specimen Description BLOOD LEFT FOREARM  Final   Special Requests   Final    BOTTLES DRAWN AEROBIC AND ANAEROBIC Blood Culture results may not be optimal due to an inadequate volume of blood received in culture bottles Performed at [facility], [address], [place], [place] [address]    Culture PENDING  Incomplete   Report Status PENDING  Incomplete  Culture, blood (routine x 2)     Status: None (Preliminary result)   Collection Time: 09/25/21  3:27 PM   Specimen: BLOOD LEFT HAND  Result Value Ref Range Status   Specimen Description BLOOD LEFT HAND  Final   Special Requests   Final    Blood Culture adequate volume BOTTLES DRAWN AEROBIC AND ANAEROBIC Performed at [facility], [address], [place], [place] [address]    Culture PENDING  Incomplete   Report Status PENDING  Incomplete  Resp Panel by RT-PCR (Flu A&B, Covid) Nasopharyngeal Swab     Status: None   Collection Time: 09/25/21  6:00 PM   Specimen: Nasopharyngeal Swab; Nasopharyngeal(NP) swabs in vial transport medium  Result Value Ref Range Status   SARS Coronavirus 2 by RT PCR NEGATIVE NEGATIVE Final    Comment: (NOTE) SARS-CoV-2 target nucleic acids are NOT DETECTED.  The SARS-CoV-2 RNA is generally detectable in upper respiratory specimens during the acute phase of infection. The lowest concentration of SARS-CoV-2 viral copies this assay can detect is 138 copies/mL. A negative result does not preclude SARS-Cov-2 infection and should not be used as the sole basis for treatment or other patient management decisions. A negative result may occur with  improper specimen collection/handling, submission of specimen other than nasopharyngeal swab, presence of viral mutation(s) within the areas targeted by this assay, and inadequate number of  viral copies(<138 copies/mL). A negative result must be combined with clinical observations, patient history, and epidemiological information. The expected result is Negative.  Fact Sheet for Patients:  EntrepreneurPulse.com.au  Fact  Sheet for Healthcare Providers:  IncredibleEmployment.be  This test is no t yet approved or cleared by the [place] FDA and  has been authorized for detection and/or diagnosis of SARS-CoV-2 by FDA under an Emergency Use Authorization (EUA). This EUA will remain  in effect (meaning this test can be used) for the duration of the COVID-19 declaration under Section 564(b)(1) of the Act, 21 U.S.C.section 360bbb-3(b)(1), unless the authorization is terminated  or revoked sooner.       Influenza A by PCR NEGATIVE NEGATIVE Final   Influenza B by PCR NEGATIVE NEGATIVE Final    Comment: (NOTE) The Xpert Xpress SARS-CoV-2/FLU/RSV plus assay is intended as an aid in the diagnosis of influenza from Nasopharyngeal swab specimens and should not be used as a sole basis for treatment. Nasal washings and aspirates are unacceptable for Xpert Xpress SARS-CoV-2/FLU/RSV testing.  Fact Sheet for Patients: EntrepreneurPulse.com.au  Fact Sheet for Healthcare Providers: IncredibleEmployment.be  This test is not yet approved or cleared by the [place] FDA and has been authorized for detection and/or diagnosis of SARS-CoV-2 by FDA under an Emergency Use Authorization (EUA). This EUA will remain in effect (meaning this test can be used) for the duration of the COVID-19 declaration under Section 564(b)(1) of the Act, 21 U.S.C. section 360bbb-3(b)(1), unless the authorization is terminated or revoked.  Performed at [facility], [address] [place],  [address]          Radiology Studies: DG Chest 1 View  Result Date: 09/25/2021 CLINICAL DATA:  Shortness of breath. EXAM:  CHEST  1 VIEW COMPARISON:  08/29/2020 FINDINGS: The cardiac silhouette, mediastinal and hilar contours are within normal limits and stable. Mild tortuosity and calcification the thoracic aorta. No acute pulmonary findings. No pulmonary lesions or pleural effusions. The bony thorax is intact. Remote rib fractures and remote left humeral head and neck fractures. IMPRESSION: No acute cardiopulmonary findings. Electronically Signed   By: Marijo Sanes M.D.   On: 09/25/2021 15:36   DG Shoulder Right  Result Date: 09/25/2021 CLINICAL DATA:  Right shoulder pain. EXAM: RIGHT SHOULDER - 2+ VIEW COMPARISON:  None. FINDINGS: Mild glenohumeral and AC joint degenerative changes but no acute fracture or dislocation. The visualized right ribs are grossly intact. IMPRESSION: Mild degenerative changes but no acute bony findings. Electronically Signed   By: Marijo Sanes M.D.   On: 09/25/2021 15:37   DG Elbow Complete Right  Result Date: 09/25/2021 CLINICAL DATA:  Right elbow pain. EXAM: RIGHT ELBOW - COMPLETE 3+ VIEW COMPARISON:  11/07/2020 FINDINGS: Limited exam due to positioning. Advanced/aggressive pattern of osteoporosis. No fracture or dislocation is noted on the lateral film. No obvious joint effusion. IMPRESSION: 1. Advanced/aggressive pattern of osteoporosis. 2. No acute bony findings. Electronically Signed   By: Marijo Sanes M.D.   On: 09/25/2021 15:39   CT HEAD WO CONTRAST (5MM)  Result Date: 09/25/2021 CLINICAL DATA:  Mental status change, unknown cause EXAM: CT HEAD WITHOUT CONTRAST TECHNIQUE: Contiguous axial images were obtained from the base of the skull through the vertex without intravenous contrast. COMPARISON:  CT head 10/10/2020, CT head 05/12/2020 BRAIN: BRAIN Cerebral ventricle sizes are concordant with the degree of cerebral volume loss. Patchy and confluent areas of decreased attenuation are noted throughout the deep and periventricular white matter of the cerebral hemispheres bilaterally,  compatible with chronic microvascular ischemic disease. Chronic left frontoparietal white matter infarction. No evidence of large-territorial acute infarction. No parenchymal hemorrhage. No mass lesion. No extra-axial collection. No mass effect or  midline shift. No hydrocephalus. Basilar cisterns are patent. Vascular: No hyperdense vessel. Atherosclerotic calcifications are present within the cavernous internal carotid arteries. Skull: No acute fracture or focal lesion. Sinuses/Orbits: Paranasal sinuses and mastoid air cells are clear. Bilateral lens replacement. Otherwise orbits are unremarkable. Other: None. IMPRESSION: No acute intracranial abnormality. Electronically Signed   By: Iven Finn M.D.   On: 09/25/2021 16:03        Scheduled Meds:  amLODipine  2.5 mg Oral Daily   aspirin  81 mg Oral Daily   atorvastatin  40 mg Oral Daily   buPROPion  75 mg Oral Daily   cholecalciferol  1,000 Units Oral Daily   clopidogrel  75 mg Oral Daily   donepezil  5 mg Oral Daily   enoxaparin (LOVENOX) injection  30 mg Subcutaneous Q24H   feeding supplement  237 mL Oral BID BM   gabapentin  100 mg Oral TID   mirtazapine  7.5 mg Oral QHS   multivitamin with minerals  1 tablet Oral Daily   sodium chloride flush  3 mL Intravenous Q12H   sodium chloride flush  3 mL Intravenous Q12H   Continuous Infusions:  sodium chloride 100 mL/hr at 09/26/21 0500   sodium chloride     cefTRIAXone (ROCEPHIN)  IV 1 g (09/25/21 1935)     LOS: 0 days   The patient is critically ill with multiple organ systems failure and requires high complexity decision making for assessment and support, frequent evaluation and titration of therapies, application of advanced monitoring technologies and extensive interpretation of multiple databases. Critical Care Time devoted to patient care services described in this note  Time spent: 40 minutes     Tuwana Kapaun, Geraldo Docker, MD [facility]   If 7PM-7AM, please contact  night-coverage 09/26/2021, 7:52 AM

## 2021-09-26 NOTE — Care Management Obs Status (Signed)
[place] NOTIFICATION   Patient Details  Name: Angelica Bell MRN: [id number] Date of Birth: [birth year]   Medicare Observation Status Notification Given:  Other (see comment) (spoke with by telephone, a copy will be mailed to address on file  due to contact precautions)    Tommy Medal 09/26/2021, 3:57 PM

## 2021-09-26 NOTE — Care Management Obs Status (Signed)
[place] NOTIFICATION   Patient Details  Name: Angelica Bell MRN: [id number] Date of Birth: [birth year]   Medicare Observation Status Notification Given:  Yes    Ihor Gully, LCSW 09/26/2021, 3:56 PM

## 2021-09-27 DIAGNOSIS — G9341 Metabolic encephalopathy: Secondary | ICD-10-CM | POA: Diagnosis not present

## 2021-09-27 LAB — COMPREHENSIVE METABOLIC PANEL
ALT: 10 U/L (ref 0–44)
AST: 16 U/L (ref 15–41)
Albumin: 3.1 g/dL — ABNORMAL LOW (ref 3.5–5.0)
Alkaline Phosphatase: 101 U/L (ref 38–126)
Anion gap: 9 (ref 5–15)
BUN: 14 mg/dL (ref 8–23)
CO2: 23 mmol/L (ref 22–32)
Calcium: 9.3 mg/dL (ref 8.9–10.3)
Chloride: 113 mmol/L — ABNORMAL HIGH (ref 98–111)
Creatinine, Ser: 0.47 mg/dL (ref 0.44–1.00)
GFR, Estimated: >60 mL/min (ref >60)
Glucose, Bld: 92 mg/dL (ref 70–99)
Potassium: 3.8 mmol/L (ref 3.5–5.1)
Sodium: 145 mmol/L (ref 135–145)
Total Bilirubin: 0.4 mg/dL (ref 0.3–1.2)
Total Protein: 5.6 g/dL — ABNORMAL LOW (ref 6.5–8.1)

## 2021-09-27 LAB — CBC WITH DIFFERENTIAL/PLATELET
Abs Immature Granulocytes: 0.02 10*3/uL (ref 0.00–0.07)
Basophils Absolute: 0.1 10*3/uL (ref 0.0–0.1)
Basophils Relative: 1 %
Eosinophils Absolute: 0.4 10*3/uL (ref 0.0–0.5)
Eosinophils Relative: 6 %
HCT: 38.3 % (ref 36.0–46.0)
Hemoglobin: 11.5 g/dL — ABNORMAL LOW (ref 12.0–15.0)
Immature Granulocytes: 0 %
Lymphocytes Relative: 23 %
Lymphs Abs: 1.6 10*3/uL (ref 0.7–4.0)
MCH: 28.8 pg (ref 26.0–34.0)
MCHC: 30 g/dL (ref 30.0–36.0)
MCV: 96 fL (ref 80.0–100.0)
Monocytes Absolute: 0.4 10*3/uL (ref 0.1–1.0)
Monocytes Relative: 6 %
Neutro Abs: 4.4 10*3/uL (ref 1.7–7.7)
Neutrophils Relative %: 64 %
Platelets: 324 10*3/uL (ref 150–400)
RBC: 3.99 MIL/uL (ref 3.87–5.11)
RDW: 13.5 % (ref 11.5–15.5)
WBC: 6.9 10*3/uL (ref 4.0–10.5)
nRBC: 0 % (ref 0.0–0.2)

## 2021-09-27 LAB — PHOSPHORUS: Phosphorus: 3.3 mg/dL (ref 2.5–4.6)

## 2021-09-27 LAB — MAGNESIUM: Magnesium: 1.7 mg/dL (ref 1.7–2.4)

## 2021-09-27 MED ORDER — HYDROCODONE-ACETAMINOPHEN 5-325 MG PO TABS: 1.0000 | ORAL_TABLET | Freq: Four times a day (QID) | ORAL | 0 refills | Status: AC | PRN

## 2021-09-27 MED ORDER — SODIUM CHLORIDE 0.9 % IV SOLN
1.0000 g | Freq: Once | INTRAVENOUS | Status: AC
  Administered 2021-09-27: 1 g via INTRAVENOUS
  Filled 2021-09-27: qty 10

## 2021-09-27 MED ORDER — ACETAMINOPHEN 325 MG PO TABS: 650.0000 mg | ORAL_TABLET | Freq: Four times a day (QID) | ORAL | 0 refills | Status: AC | PRN

## 2021-09-27 MED ORDER — MAGNESIUM SULFATE 2 GM/50ML IV SOLN
2.0000 g | Freq: Once | INTRAVENOUS | Status: AC
  Administered 2021-09-27: 2 g via INTRAVENOUS
  Filled 2021-09-27: qty 50

## 2021-09-27 MED ORDER — TRAZODONE HCL 50 MG PO TABS: 50.0000 mg | ORAL_TABLET | Freq: Every evening | ORAL | 1 refills | Status: AC | PRN

## 2021-09-27 MED ORDER — POLYETHYLENE GLYCOL 3350 17 G PO PACK: 17.0000 g | PACK | Freq: Every day | ORAL | 2 refills | Status: AC | PRN

## 2021-09-27 MED ORDER — ENSURE ENLIVE PO LIQD: 237.0000 mL | Freq: Two times a day (BID) | ORAL | 12 refills | Status: AC

## 2021-09-27 MED ORDER — ASPIRIN 81 MG PO TBEC: 81.0000 mg | DELAYED_RELEASE_TABLET | Freq: Every day | ORAL | 3 refills | Status: AC

## 2021-09-27 MED ORDER — CEFDINIR 300 MG PO CAPS: 300.0000 mg | ORAL_CAPSULE | Freq: Two times a day (BID) | ORAL | 0 refills | Status: DC

## 2021-09-27 MED ORDER — CEFDINIR 300 MG PO CAPS: 300.0000 mg | ORAL_CAPSULE | Freq: Two times a day (BID) | ORAL | 0 refills | Status: AC

## 2021-09-27 NOTE — Discharge Summary (Addendum)
                                                                                 Angelica Bell, is a 87 y.o. female  DOB 06/14/1932  MRN [id number].  Admission date:  09/25/2021  Admitting Physician   , MD  Discharge Date:  09/27/2021   Primary MD  System, Provider Not In  Recommendations for primary care physician for things to follow:   1)Avoid ibuprofen/Advil/Aleve/Motrin/Goody Powders/Naproxen/BC powders/Meloxicam/Diclofenac/Indomethacin and other Nonsteroidal anti-inflammatory medications as these will make you more likely to bleed and can cause stomach ulcers, can also cause Kidney problems.   2)Repeat CBC and BMP in 1 week   Admission Diagnosis  Acute metabolic encephalopathy [G93.41] AMS (altered mental status) [R41.82]   Discharge Diagnosis  Acute metabolic encephalopathy [G93.41] AMS (altered mental status) [R41.82]    Principal Problem:   Acute metabolic encephalopathy Active Problems:   Essential hypertension   Failure to thrive in adult   Acute lower UTI--POA      Past Medical History:  Diagnosis Date   Chronic back pain    with degenerative joint disease   Chronic pain disorder    with narcotic management   Dry eyes    Frequent falls    HOH (hard of hearing)    Hypertension    Multiple rib fractures 02/2020   Osteoporosis    PE (pulmonary embolism)    After hysterectomy in 1967   Pneumonia    Rhabdomyolysis    Stroke (HCC)     Past Surgical History:  Procedure Laterality Date   ABDOMINAL HYSTERECTOMY     APPENDECTOMY     BREAST ENHANCEMENT SURGERY     CATARACT EXTRACTION     FEMUR IM NAIL Left 02/17/2017   Procedure: INTRAMEDULLARY (IM) NAIL FEMORAL;  Surgeon: Jason Patrick Rogers, MD;  Location: [facility] OR;  Service: Orthopedics;  Laterality: Left;   HIP ARTHROPLASTY Right 12/24/2015   Procedure: ARTHROPLASTY BIPOLAR HIP (HEMIARTHROPLASTY);  Surgeon: Steve Norris, MD;  Location: [facility];  Service: Orthopedics;  Laterality: Right;    TONSILLECTOMY       HPI  from the history and physical done on the day of admission:    Angelica Bell  is a 87 y.o. female reformed smoker with past medical history relevant for history of prior stroke, HTN, hearing loss and dementia who presents from nursing facility after an episode of unresponsiveness -No CPR was administered----patient apparently came around by herself and then became combative and agitated -Per staff became more lethargic throughout the day and they thought she was unresponsive--no frank syncope per se -Patient is a very poor historian due to underlying dementia -Patient is uncooperative with EKG with lots of artifacts--not sure if atrial flutter -COVID test is negative UA strongly suggestive of UTI -Initial lactic acid two-point 2 repeat after fluids 1.3 -Troponin is not elevated WBC is 13.2 -Creatinine 0.45 LFTs are not elevated    Hospital Course:    1) acute metabolic encephalopathy secondary to UTI--POA --appears back to baseline  2)HTN--continue amlodipine   3) dementia--with behavioral disturbance,  -Appears back to baseline continue Remeron, Aricept and bupropion   4) history of prior   stroke----continue aspirin, Plavix and Lipitor for secondary stroke prophylaxis   5)Sepsis secondary to GNR UTI-----POA-- -Patient met  sepsis criteria on admission with leukocytosis tachycardia tachypnea and altered mentation with elevated lactic acid levels management as above #1 --Initial lactic acid 2.2 , repeat after fluids 1.3 -Treated with IV Rocephin okay to discharge on p.o Omnicef/cefdinir---  -Urine culture was a catheterized specimen showed 30,000 colonies with clinical symptoms of UTI and altered mentation is clinically significant --final urine culture ID and sensitivities NOT available at this time   Disposition/-back to SNF at [facility] Dispo: The patient is from: SNF              Anticipated d/c is to: SNF  Discharge Condition: stable  Follow  UP   Contact information for after-discharge care     Destination     [facility] .   Service: Skilled Nursing Contact information: [address] [place] [address] [phone]                      Consults obtained - na  Diet and Activity recommendation:  As advised  Discharge Instructions     Discharge Instructions     Call MD for:  difficulty breathing, headache or visual disturbances   Complete by: As directed    Call MD for:  persistant dizziness or light-headedness   Complete by: As directed    Call MD for:  persistant nausea and vomiting   Complete by: As directed    Call MD for:  temperature >100.4   Complete by: As directed    Diet general   Complete by: As directed    Discharge instructions   Complete by: As directed    1)Avoid ibuprofen/Advil/Aleve/Motrin/Goody Powders/Naproxen/BC powders/Meloxicam/Diclofenac/Indomethacin and other Nonsteroidal anti-inflammatory medications as these will make you more likely to bleed and can cause stomach ulcers, can also cause Kidney problems.   2)Repeat CBC and BMP in 1 week   Increase activity slowly   Complete by: As directed         Discharge Medications     Allergies as of 09/27/2021       Reactions   Hctz [hydrochlorothiazide]    Significant and rapid hyponatremia (TIH)   Amoxicillin-pot Clavulanate Hives, Diarrhea   Has patient had a PCN reaction causing immediate rash, facial/tongue/throat swelling, SOB or lightheadedness with hypotension: yes Has patient had a PCN reaction causing severe rash involving mucus membranes or skin necrosis: yes Has patient had a PCN reaction that required hospitalization : unknown Has patient had a PCN reaction occurring within the last 10 years: unknown If all of the above answers are "NO", then may proceed with Cephalosporin use.   Lisinopril-hydrochlorothiazide Other (See Comments)   Tired- no energy to do anything..   Sulfa  Antibiotics Hives, Nausea And Vomiting   Sulfonamide Derivatives Hives        Medication List     STOP taking these medications    predniSONE 10 MG tablet Commonly known as: DELTASONE   predniSONE 20 MG tablet Commonly known as: DELTASONE       TAKE these medications    acetaminophen 325 MG tablet Commonly known as: TYLENOL Take 2 tablets (650 mg total) by mouth every 6 (six) hours as needed for mild pain or fever (or Fever >/= 101). What changed:  medication strength how much to take reasons to take this   amLODipine 2.5 MG tablet Commonly known as: NORVASC Take 1   tablet (2.5 mg total) by mouth daily.   aspirin 81 MG EC tablet Take 1 tablet (81 mg total) by mouth daily with breakfast. What changed: when to take this   atorvastatin 40 MG tablet Commonly known as: LIPITOR Take 1 tablet (40 mg total) by mouth daily.   buPROPion 150 MG 24 hr tablet Commonly known as: WELLBUTRIN XL Take 150 mg by mouth every morning.   cefdinir 300 MG capsule Commonly known as: OMNICEF Take 1 capsule (300 mg total) by mouth 2 (two) times daily for 3 days.   clopidogrel 75 MG tablet Commonly known as: PLAVIX Take 1 tablet (75 mg total) by mouth daily. Stop after 25 days   donepezil 5 MG tablet Commonly known as: ARICEPT Take 5 mg by mouth daily.   gabapentin 100 MG capsule Commonly known as: NEURONTIN Take 100 mg by mouth 3 (three) times daily. For nerve pain.   HYDROcodone-acetaminophen 5-325 MG tablet Commonly known as: NORCO/VICODIN Take 1 tablet by mouth every 6 (six) hours as needed for moderate pain. What changed:  reasons to take this Another medication with the same name was removed. Continue taking this medication, and follow the directions you see here.   mirtazapine 7.5 MG tablet Commonly known as: REMERON Take 1 tablet (7.5 mg total) by mouth at bedtime.   multivitamin with minerals Tabs tablet Take 1 tablet by mouth daily.   polyethylene glycol 17 g  packet Commonly known as: MIRALAX / GLYCOLAX Take 17 g by mouth daily as needed for mild constipation.   Resource 2.0 Liqd Take 60 mLs by mouth in the morning, at noon, and at bedtime. What changed: Another medication with the same name was added. Make sure you understand how and when to take each.   feeding supplement Liqd Take 237 mLs by mouth 2 (two) times daily between meals. What changed: You were already taking a medication with the same name, and this prescription was added. Make sure you understand how and when to take each.   sertraline 50 MG tablet Commonly known as: ZOLOFT Take 50 mg by mouth daily.   traZODone 50 MG tablet Commonly known as: DESYREL Take 1 tablet (50 mg total) by mouth at bedtime as needed for sleep.   Vitamin D3 25 MCG tablet Commonly known as: Vitamin D Take 1 tablet (1,000 Units total) by mouth daily.        Major procedures and Radiology Reports - PLEASE review detailed and final reports for all details, in brief -    DG Chest 1 View  Result Date: 09/25/2021 CLINICAL DATA:  Shortness of breath. EXAM: CHEST  1 VIEW COMPARISON:  08/29/2020 FINDINGS: The cardiac silhouette, mediastinal and hilar contours are within normal limits and stable. Mild tortuosity and calcification the thoracic aorta. No acute pulmonary findings. No pulmonary lesions or pleural effusions. The bony thorax is intact. Remote rib fractures and remote left humeral head and neck fractures. IMPRESSION: No acute cardiopulmonary findings. Electronically Signed   By: P.  Gallerani M.D.   On: 09/25/2021 15:36   DG Shoulder Right  Result Date: 09/25/2021 CLINICAL DATA:  Right shoulder pain. EXAM: RIGHT SHOULDER - 2+ VIEW COMPARISON:  None. FINDINGS: Mild glenohumeral and AC joint degenerative changes but no acute fracture or dislocation. The visualized right ribs are grossly intact. IMPRESSION: Mild degenerative changes but no acute bony findings. Electronically Signed   By: P.   Gallerani M.D.   On: 09/25/2021 15:37   DG Elbow Complete Right  Result Date:   09/25/2021 CLINICAL DATA:  Right elbow pain. EXAM: RIGHT ELBOW - COMPLETE 3+ VIEW COMPARISON:  11/07/2020 FINDINGS: Limited exam due to positioning. Advanced/aggressive pattern of osteoporosis. No fracture or dislocation is noted on the lateral film. No obvious joint effusion. IMPRESSION: 1. Advanced/aggressive pattern of osteoporosis. 2. No acute bony findings. Electronically Signed   By: P.  Gallerani M.D.   On: 09/25/2021 15:39   CT HEAD WO CONTRAST (5MM)  Result Date: 09/25/2021 CLINICAL DATA:  Mental status change, unknown cause EXAM: CT HEAD WITHOUT CONTRAST TECHNIQUE: Contiguous axial images were obtained from the base of the skull through the vertex without intravenous contrast. COMPARISON:  CT head 10/10/2020, CT head 05/12/2020 BRAIN: BRAIN Cerebral ventricle sizes are concordant with the degree of cerebral volume loss. Patchy and confluent areas of decreased attenuation are noted throughout the deep and periventricular white matter of the cerebral hemispheres bilaterally, compatible with chronic microvascular ischemic disease. Chronic left frontoparietal white matter infarction. No evidence of large-territorial acute infarction. No parenchymal hemorrhage. No mass lesion. No extra-axial collection. No mass effect or midline shift. No hydrocephalus. Basilar cisterns are patent. Vascular: No hyperdense vessel. Atherosclerotic calcifications are present within the cavernous internal carotid arteries. Skull: No acute fracture or focal lesion. Sinuses/Orbits: Paranasal sinuses and mastoid air cells are clear. Bilateral lens replacement. Otherwise orbits are unremarkable. Other: None. IMPRESSION: No acute intracranial abnormality. Electronically Signed   By: Morgane  Naveau M.D.   On: 09/25/2021 16:03    Micro Results    Recent Results (from the past 240 hour(s))  Culture, blood (routine x 2)     Status: None (Preliminary  result)   Collection Time: 09/25/21  2:30 PM   Specimen: BLOOD LEFT FOREARM  Result Value Ref Range Status   Specimen Description BLOOD LEFT FOREARM  Final   Special Requests   Final    BOTTLES DRAWN AEROBIC AND ANAEROBIC Blood Culture results may not be optimal due to an inadequate volume of blood received in culture bottles   Culture   Final    NO GROWTH 2 DAYS Performed at [facility], [address] [place], [place] [address]    Report Status PENDING  Incomplete  Culture, blood (routine x 2)     Status: None (Preliminary result)   Collection Time: 09/25/21  3:27 PM   Specimen: BLOOD LEFT HAND  Result Value Ref Range Status   Specimen Description BLOOD LEFT HAND  Final   Special Requests   Final    Blood Culture adequate volume BOTTLES DRAWN AEROBIC AND ANAEROBIC   Culture   Final    NO GROWTH 2 DAYS Performed at [facility], [address] [place], [place] [address]    Report Status PENDING  Incomplete  Urine Culture     Status: Abnormal (Preliminary result)   Collection Time: 09/25/21  5:00 PM   Specimen: Urine, Clean Catch  Result Value Ref Range Status   Specimen Description   Final    URINE, CLEAN CATCH Performed at [facility], [address] [place], [place] [address]    Special Requests   Final    NONE Performed at [facility], [address] [place], [place] [address]    Culture (A)  Final    30,000 COLONIES/mL GRAM NEGATIVE RODS CULTURE REINCUBATED FOR BETTER GROWTH SUSCEPTIBILITIES TO FOLLOW Performed at [facility], [address] [place], [place] [address]    Report Status PENDING  Incomplete  Resp Panel by RT-PCR (Flu A&B, Covid) Nasopharyngeal Swab       Status: None   Collection Time: 09/25/21  6:00 PM   Specimen: Nasopharyngeal Swab; Nasopharyngeal(NP) swabs in vial transport medium  Result Value Ref Range Status   SARS Coronavirus 2 by RT PCR NEGATIVE NEGATIVE Final    Comment: (NOTE) SARS-CoV-2 target nucleic acids are NOT  DETECTED.  The SARS-CoV-2 RNA is generally detectable in upper respiratory specimens during the acute phase of infection. The lowest concentration of SARS-CoV-2 viral copies this assay can detect is 138 copies/mL. A negative result does not preclude SARS-Cov-2 infection and should not be used as the sole basis for treatment or other patient management decisions. A negative result may occur with  improper specimen collection/handling, submission of specimen other than nasopharyngeal swab, presence of viral mutation(s) within the areas targeted by this assay, and inadequate number of viral copies(<138 copies/mL). A negative result must be combined with clinical observations, patient history, and epidemiological information. The expected result is Negative.  Fact Sheet for Patients:  EntrepreneurPulse.com.au  Fact Sheet for Healthcare Providers:  IncredibleEmployment.be  This test is no t yet approved or cleared by the [place] FDA and  has been authorized for detection and/or diagnosis of SARS-CoV-2 by FDA under an Emergency Use Authorization (EUA). This EUA will remain  in effect (meaning this test can be used) for the duration of the COVID-19 declaration under Section 564(b)(1) of the Act, 21 U.S.C.section 360bbb-3(b)(1), unless the authorization is terminated  or revoked sooner.       Influenza A by PCR NEGATIVE NEGATIVE Final   Influenza B by PCR NEGATIVE NEGATIVE Final    Comment: (NOTE) The Xpert Xpress SARS-CoV-2/FLU/RSV plus assay is intended as an aid in the diagnosis of influenza from Nasopharyngeal swab specimens and should not be used as a sole basis for treatment. Nasal washings and aspirates are unacceptable for Xpert Xpress SARS-CoV-2/FLU/RSV testing.  Fact Sheet for Patients: EntrepreneurPulse.com.au  Fact Sheet for Healthcare Providers: IncredibleEmployment.be  This test is not yet  approved or cleared by the [place] FDA and has been authorized for detection and/or diagnosis of SARS-CoV-2 by FDA under an Emergency Use Authorization (EUA). This EUA will remain in effect (meaning this test can be used) for the duration of the COVID-19 declaration under Section 564(b)(1) of the Act, 21 U.S.C. section 360bbb-3(b)(1), unless the authorization is terminated or revoked.  Performed at [facility], [address] [place], [place] [address]      Today   [place] today has no new complaints  No Fevers, -- a lot more cooperative and coherent today          Patient has been seen and examined prior to discharge   Objective   Blood pressure (!) 144/79, pulse 80, temperature 98.2 F (36.8 C), resp. rate 18, height 5' 1" (1.549 m), weight 44 kg, SpO2 98 %.   Intake/Output Summary (Last 24 hours) at 09/27/2021 1220 Last data filed at 09/26/2021 2154 Gross per 24 hour  Intake 10 ml  Output --  Net 10 ml    Exam Physical Examination: General appearance - alert, frail appearing, and in no distress  Mental status ---appropriate affect, cooperate more coherent, some baseline cognitive and memory deficits  eyes - sclera anicteric Neck - supple, no JVD elevation , Chest - clear  to auscultation bilaterally, symmetrical air movement,  Heart - S1 and S2 normal, regular  Abdomen - soft, nontender, nondistended, no masses or organomegaly Neurological - chronic neuromuscular deficits--pt is  cooperative with exam-- --Residual  Rt Sided Hemiparesis, No new additional Focal deficits -Extremities - no pedal edema noted, intact peripheral pulses  Skin - warm, dry   Data Review   CBC w Diff:  Lab Results  Component Value Date   WBC 6.9 09/27/2021   HGB 11.5 (L) 09/27/2021   HCT 38.3 09/27/2021   HCT 43.7 08/31/2020   PLT 324 09/27/2021   LYMPHOPCT 23 09/27/2021   MONOPCT 6 09/27/2021   EOSPCT 6 09/27/2021   BASOPCT 1 09/27/2021    CMP:   Lab Results  Component Value Date   NA 145 09/27/2021   NA 138 02/24/2017   K 3.8 09/27/2021   CL 113 (H) 09/27/2021   CO2 23 09/27/2021   BUN 14 09/27/2021   BUN 15 02/24/2017   CREATININE 0.47 09/27/2021   GLU 175 02/24/2017   PROT 5.6 (L) 09/27/2021   ALBUMIN 3.1 (L) 09/27/2021   BILITOT 0.4 09/27/2021   ALKPHOS 101 09/27/2021   AST 16 09/27/2021   ALT 10 09/27/2021  .  Total Discharge time is about 33 minutes  Roxan Hockey M.D on 09/27/2021 at 12:20 PM  Go to [email or web address] -  for contact info  [facility] - Office  [phone]

## 2021-09-27 NOTE — Progress Notes (Signed)
EMS arrived to transport patient back to [facility]. Vital signs obtained. Patient transferred to stretcher, patient belongings with patient. IV removed on previous shift. Called report to [facility], report given to [employer], [place].

## 2021-09-27 NOTE — Discharge Instructions (Signed)
1)Avoid ibuprofen/Advil/Aleve/Motrin/Goody Powders/Naproxen/BC powders/Meloxicam/Diclofenac/Indomethacin and other Nonsteroidal anti-inflammatory medications as these will make you more likely to bleed and can cause stomach ulcers, can also cause Kidney problems.   2)Repeat CBC and BMP in 1 week

## 2021-09-28 LAB — URINE CULTURE: Culture: 30000 — AB

## 2021-09-30 LAB — CULTURE, BLOOD (ROUTINE X 2)
Culture: NO GROWTH
Culture: NO GROWTH
Special Requests: ADEQUATE

## 2022-05-25 IMAGING — DX DG SHOULDER 2+V*R*
2 series · 2 of 2 positions shown · non-contrast
Comparison: None.

CLINICAL DATA: Right shoulder pain.

EXAM:
RIGHT SHOULDER - 2+ VIEW

[shoulder grashey]
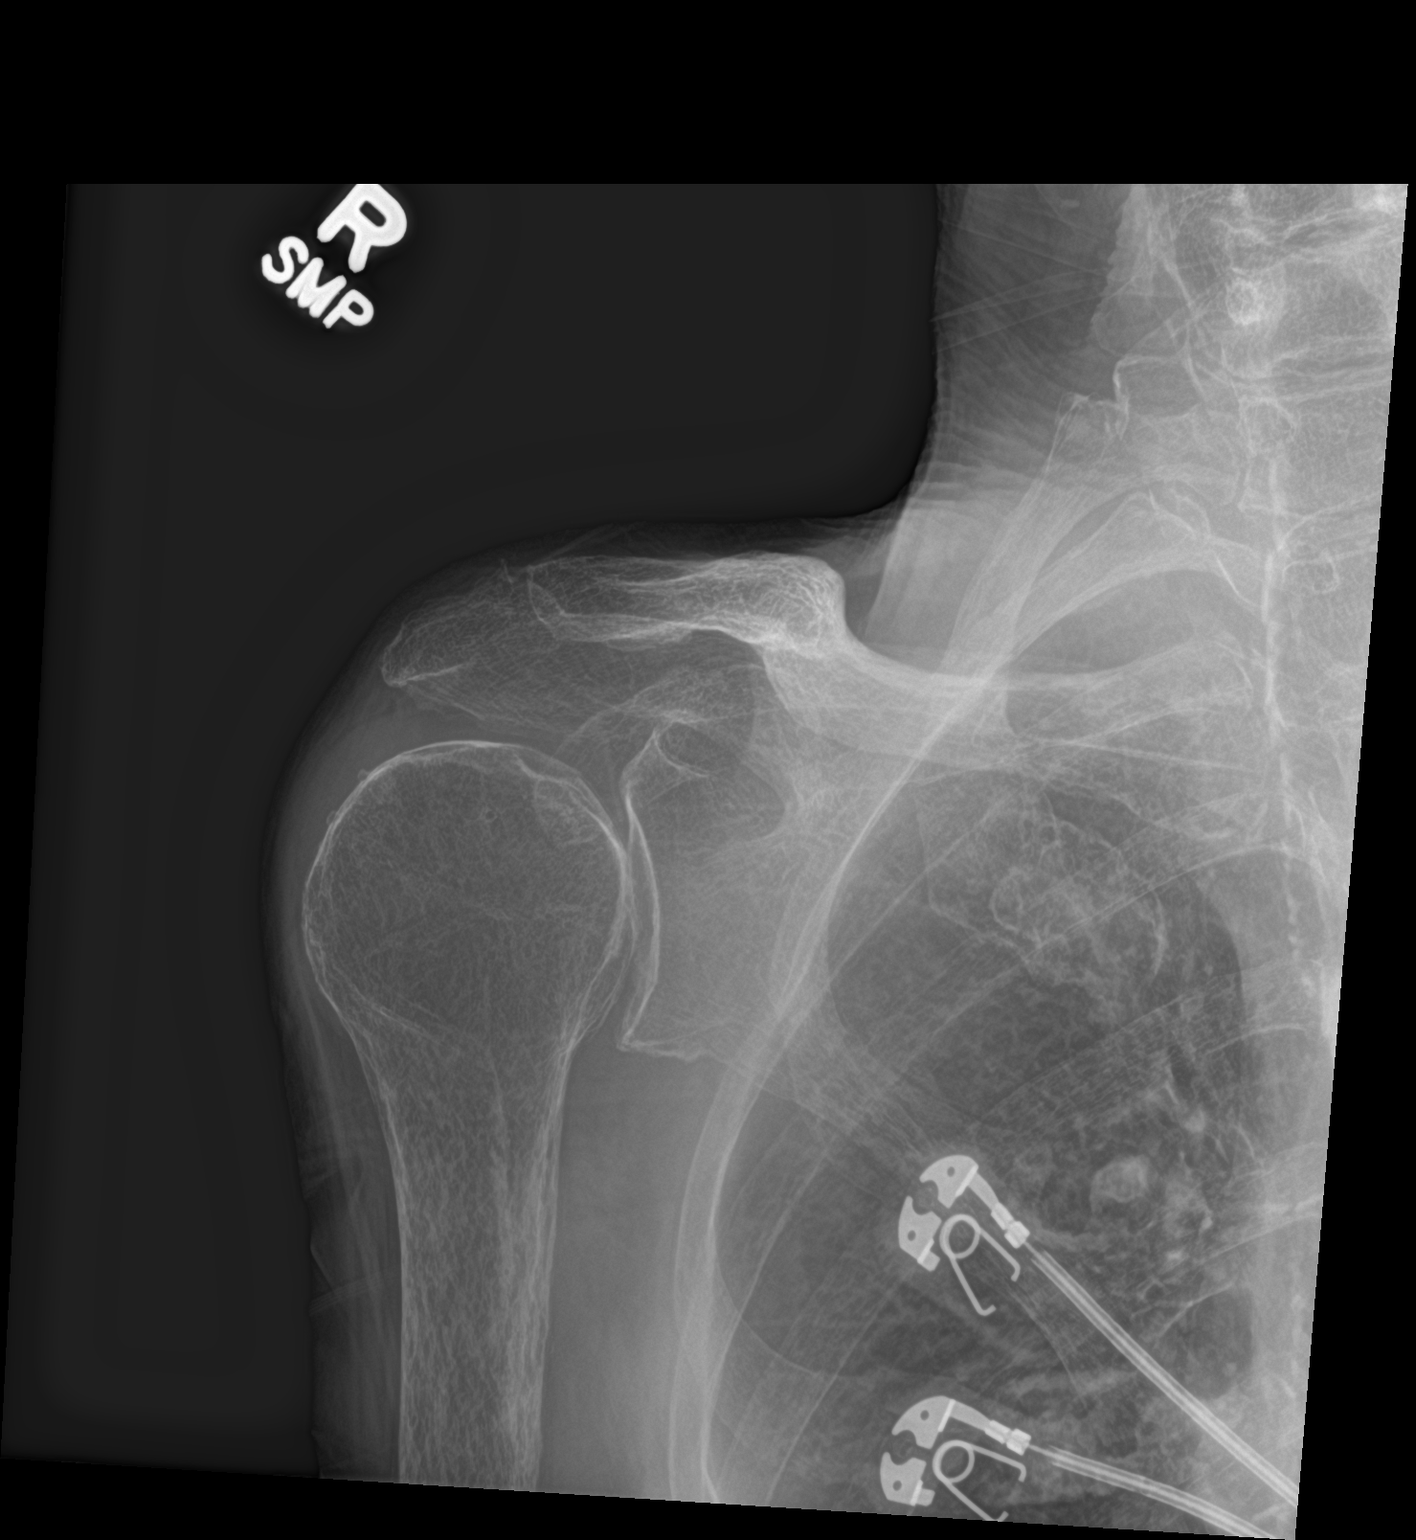

[shoulder y view]
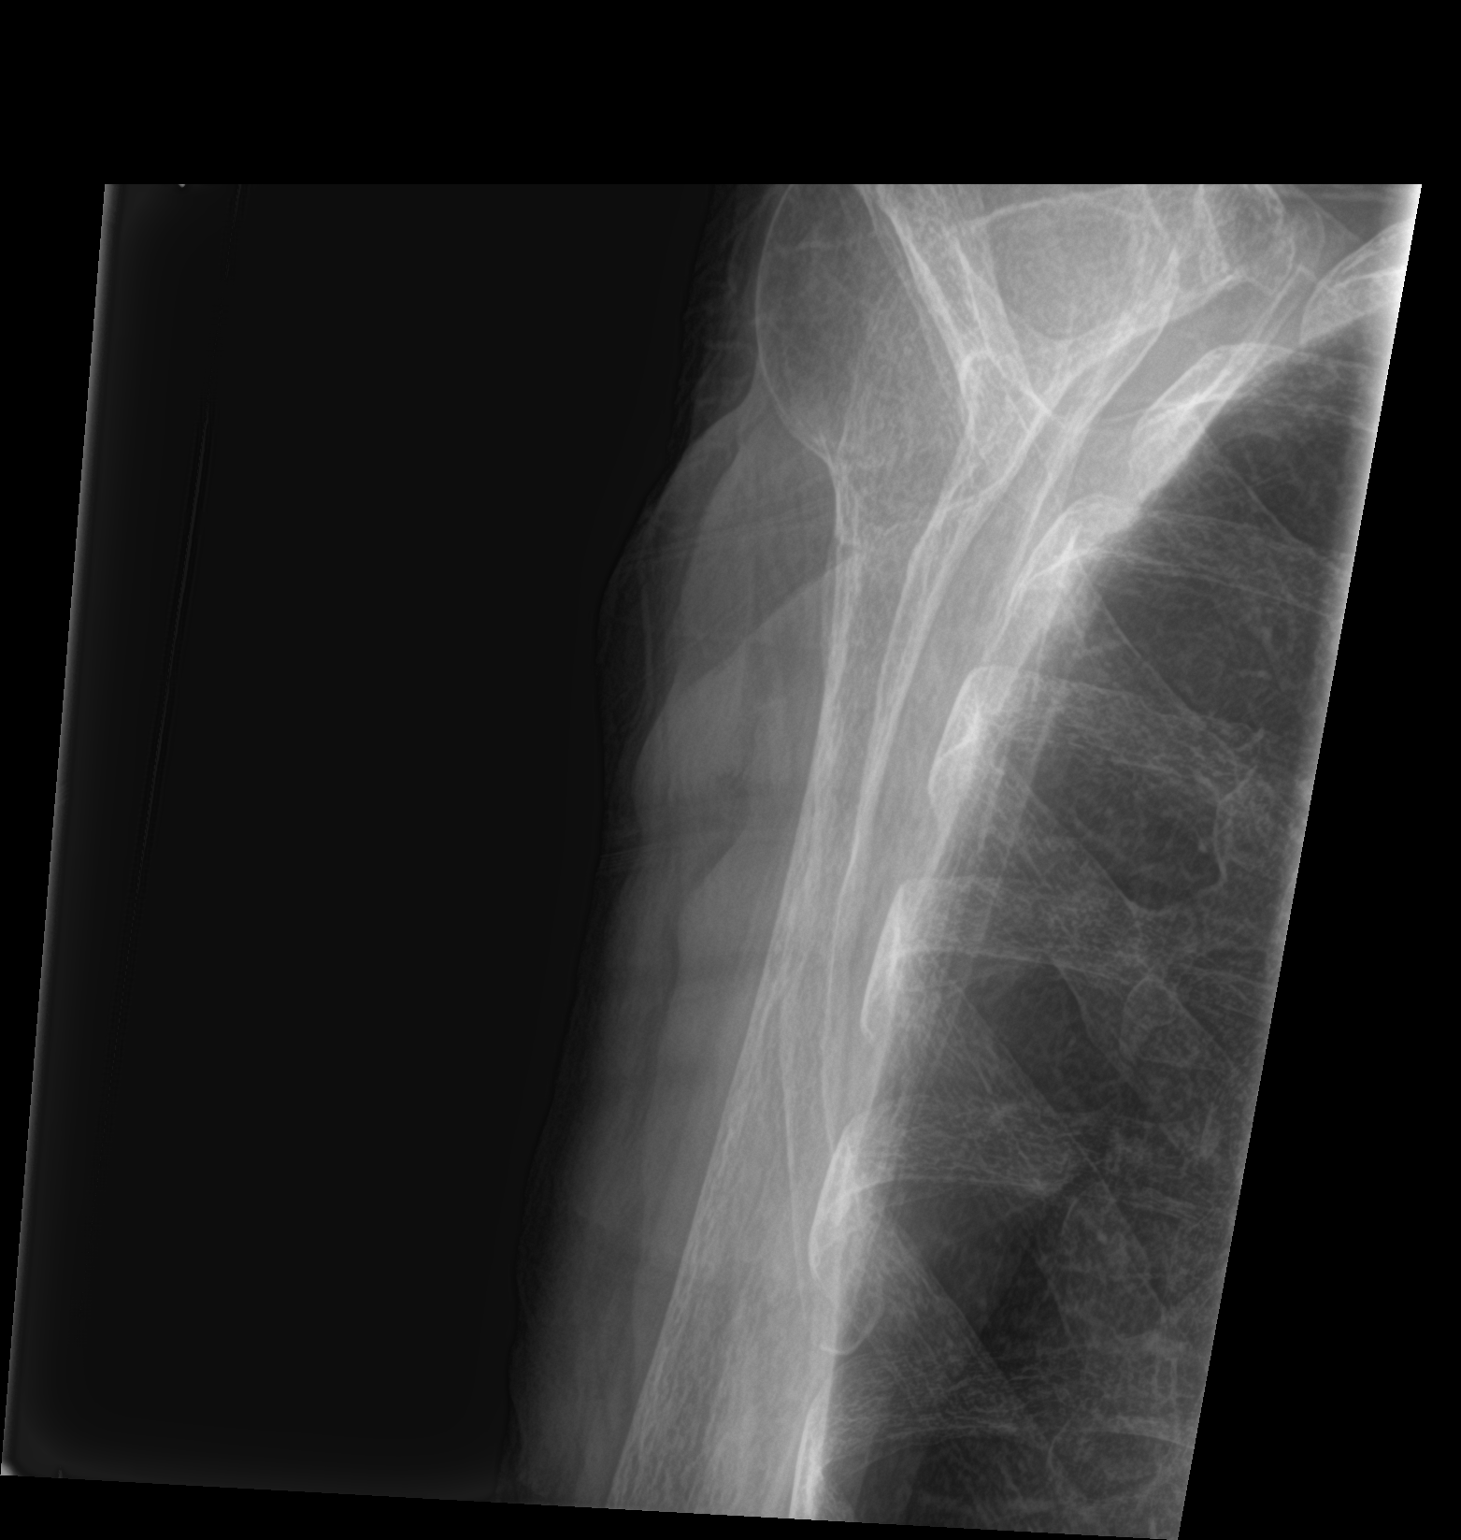

[2 of 2 positions shown; findings below may reference images not displayed]

FINDINGS: Mild glenohumeral and AC joint degenerative changes but no acute
fracture or dislocation. The visualized right ribs are grossly
intact.
IMPRESSION: Mild degenerative changes but no acute bony findings.

## 2022-05-25 IMAGING — CT CT HEAD W/O CM
3 series · 15 of 47 positions shown, 18 images · non-contrast
Comparison: CT head 10/10/2020, CT head 05/12/2020

CLINICAL DATA: Mental status change, unknown cause

EXAM:
CT HEAD WITHOUT CONTRAST
TECHNIQUE: Contiguous axial images were obtained from the base of the skull
through the vertex without intravenous contrast.

[Series 2: sagittal soft · sagittal · 0.33mm/px · 3 of 54 slices shown]
[im 18/54  brain]
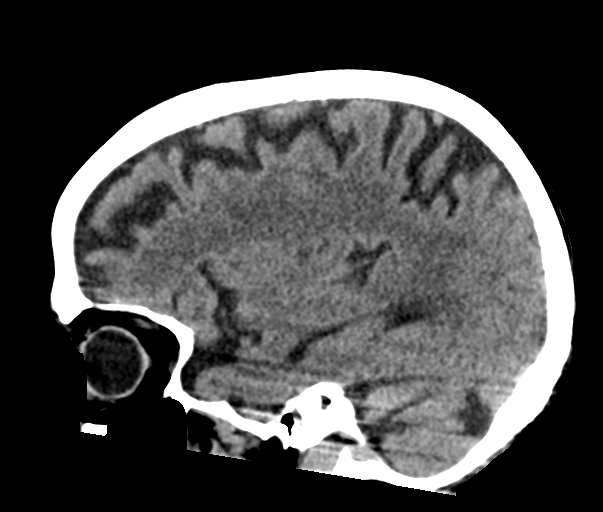
[im 27/54  brain]
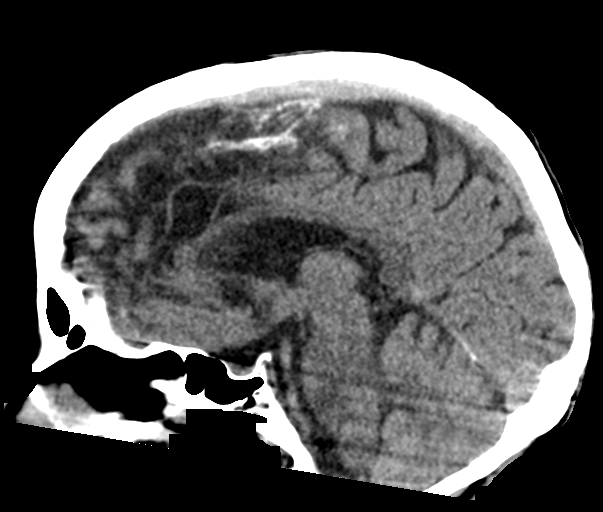
[im 36/54  brain]
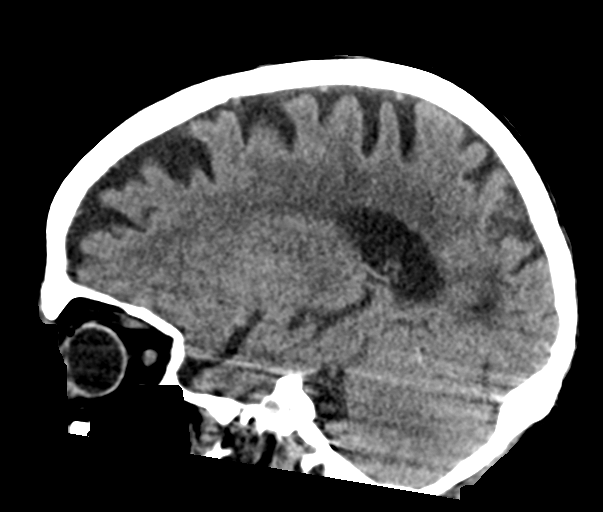

[Series 3: head w o · axial · 0.41mm/px · z∈[+51,+176]mm · 9 of 30 slices shown, 12 images]
[im 3/30  brain]
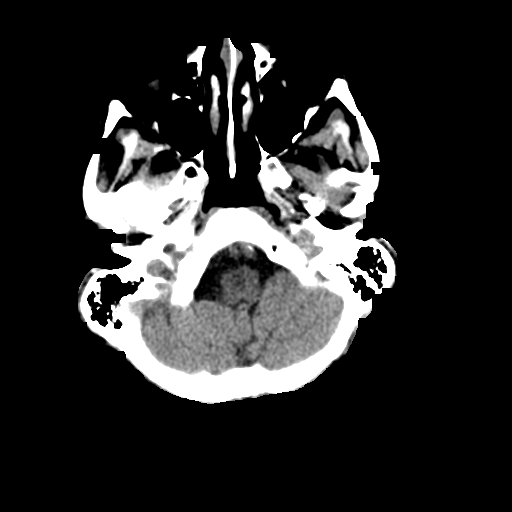
[im 3/30  bone]
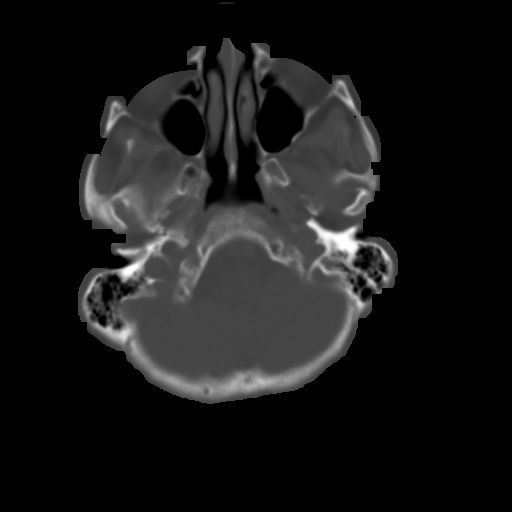
[im 6/30  brain]
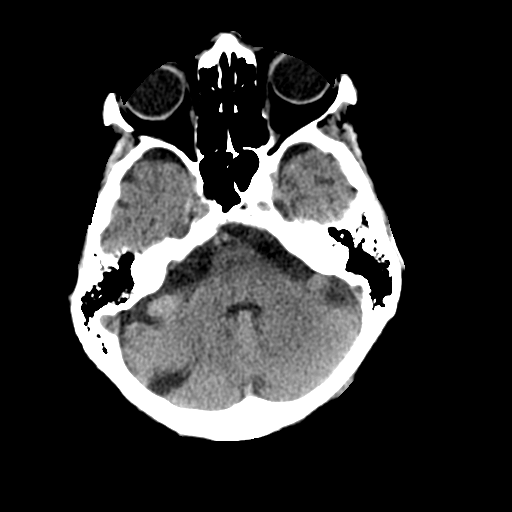
[im 9/30  brain]
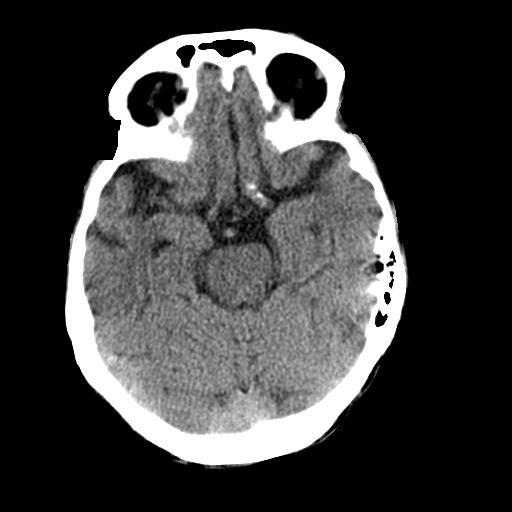
[im 12/30  brain]
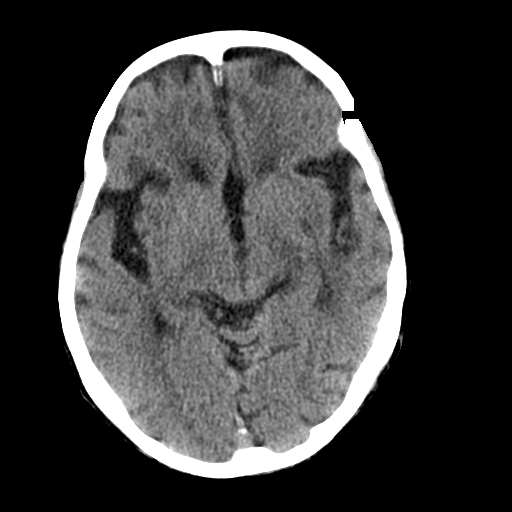
[im 16/30  brain]
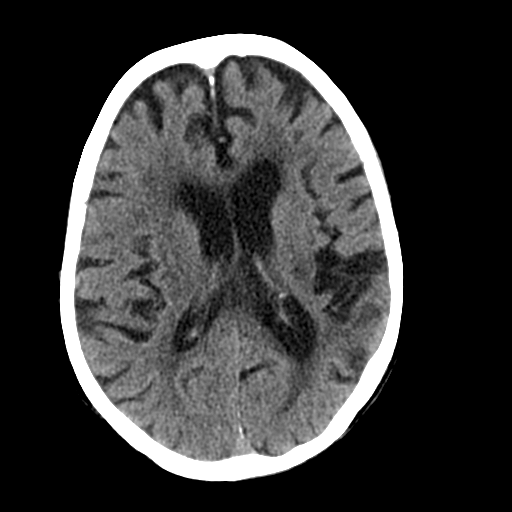
[im 16/30  bone]
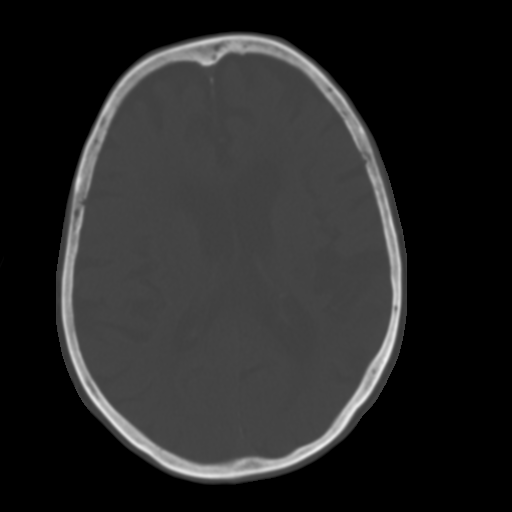
[im 19/30  brain]
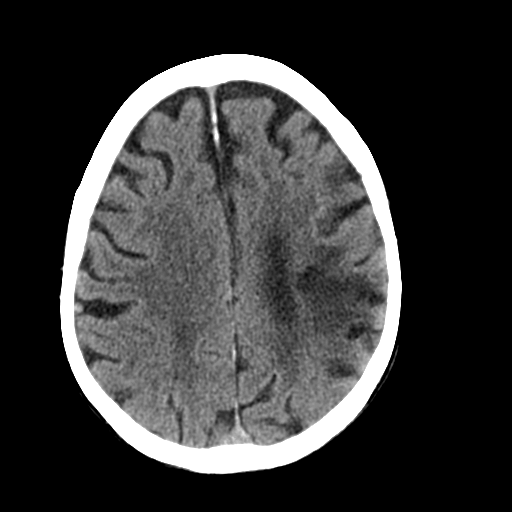
[im 22/30  brain]
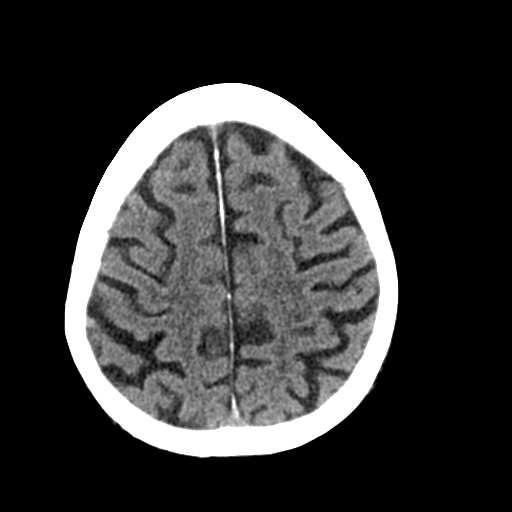
[im 25/30  brain]
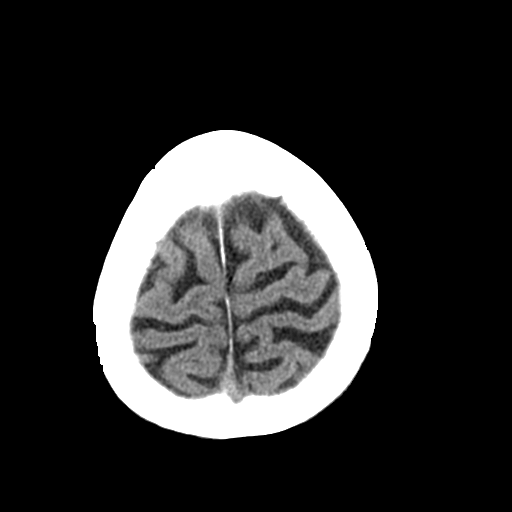
[im 28/30  brain]
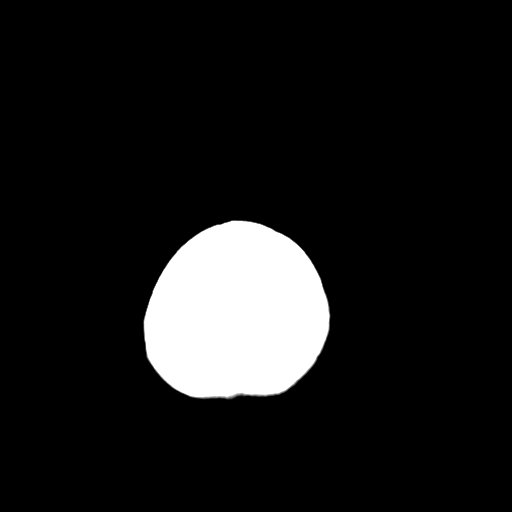
[im 28/30  bone]
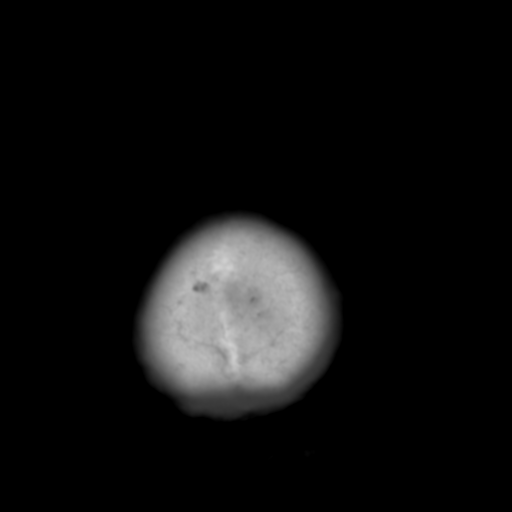

[Series 5: coronal soft · coronal · 0.30mm/px · 3 of 66 slices shown]
[im 22/66  brain]
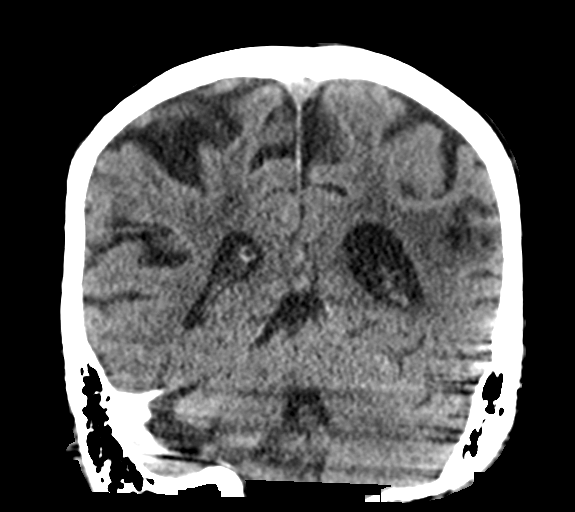
[im 29/66  brain]
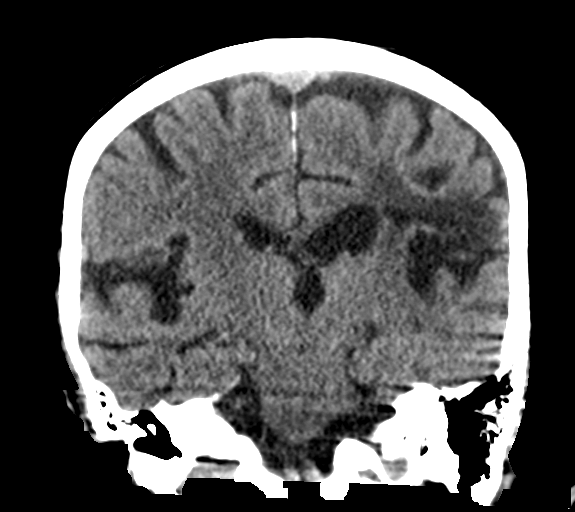
[im 37/66  brain]
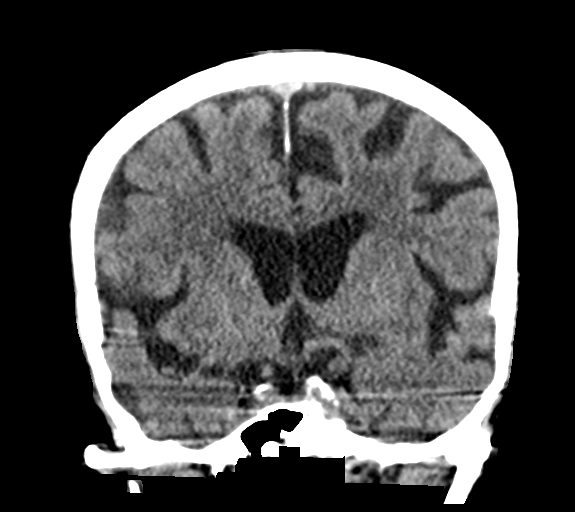

[15 of 47 positions shown; findings below may reference images not displayed]

BRAIN:
BRAIN
Cerebral ventricle sizes are concordant with the degree of cerebral
volume loss. Patchy and confluent areas of decreased attenuation are
noted throughout the deep and periventricular white matter of the
cerebral hemispheres bilaterally, compatible with chronic
microvascular ischemic disease. Chronic left frontoparietal white
matter infarction.

No evidence of large-territorial acute infarction. No parenchymal
hemorrhage. No mass lesion. No extra-axial collection.

No mass effect or midline shift. No hydrocephalus. Basilar cisterns
are patent.

Vascular: No hyperdense vessel. Atherosclerotic calcifications are
present within the cavernous internal carotid arteries.

Skull: No acute fracture or focal lesion.

Sinuses/Orbits: Paranasal sinuses and mastoid air cells are clear.
Bilateral lens replacement. Otherwise orbits are unremarkable.

Other: None.
IMPRESSION: No acute intracranial abnormality.

## 2022-05-25 IMAGING — DX DG CHEST 1V
1 series · 1 of 1 positions shown · non-contrast
Comparison: 08/29/2020

CLINICAL DATA: Shortness of breath.

EXAM:
CHEST  1 VIEW

[chest ap]
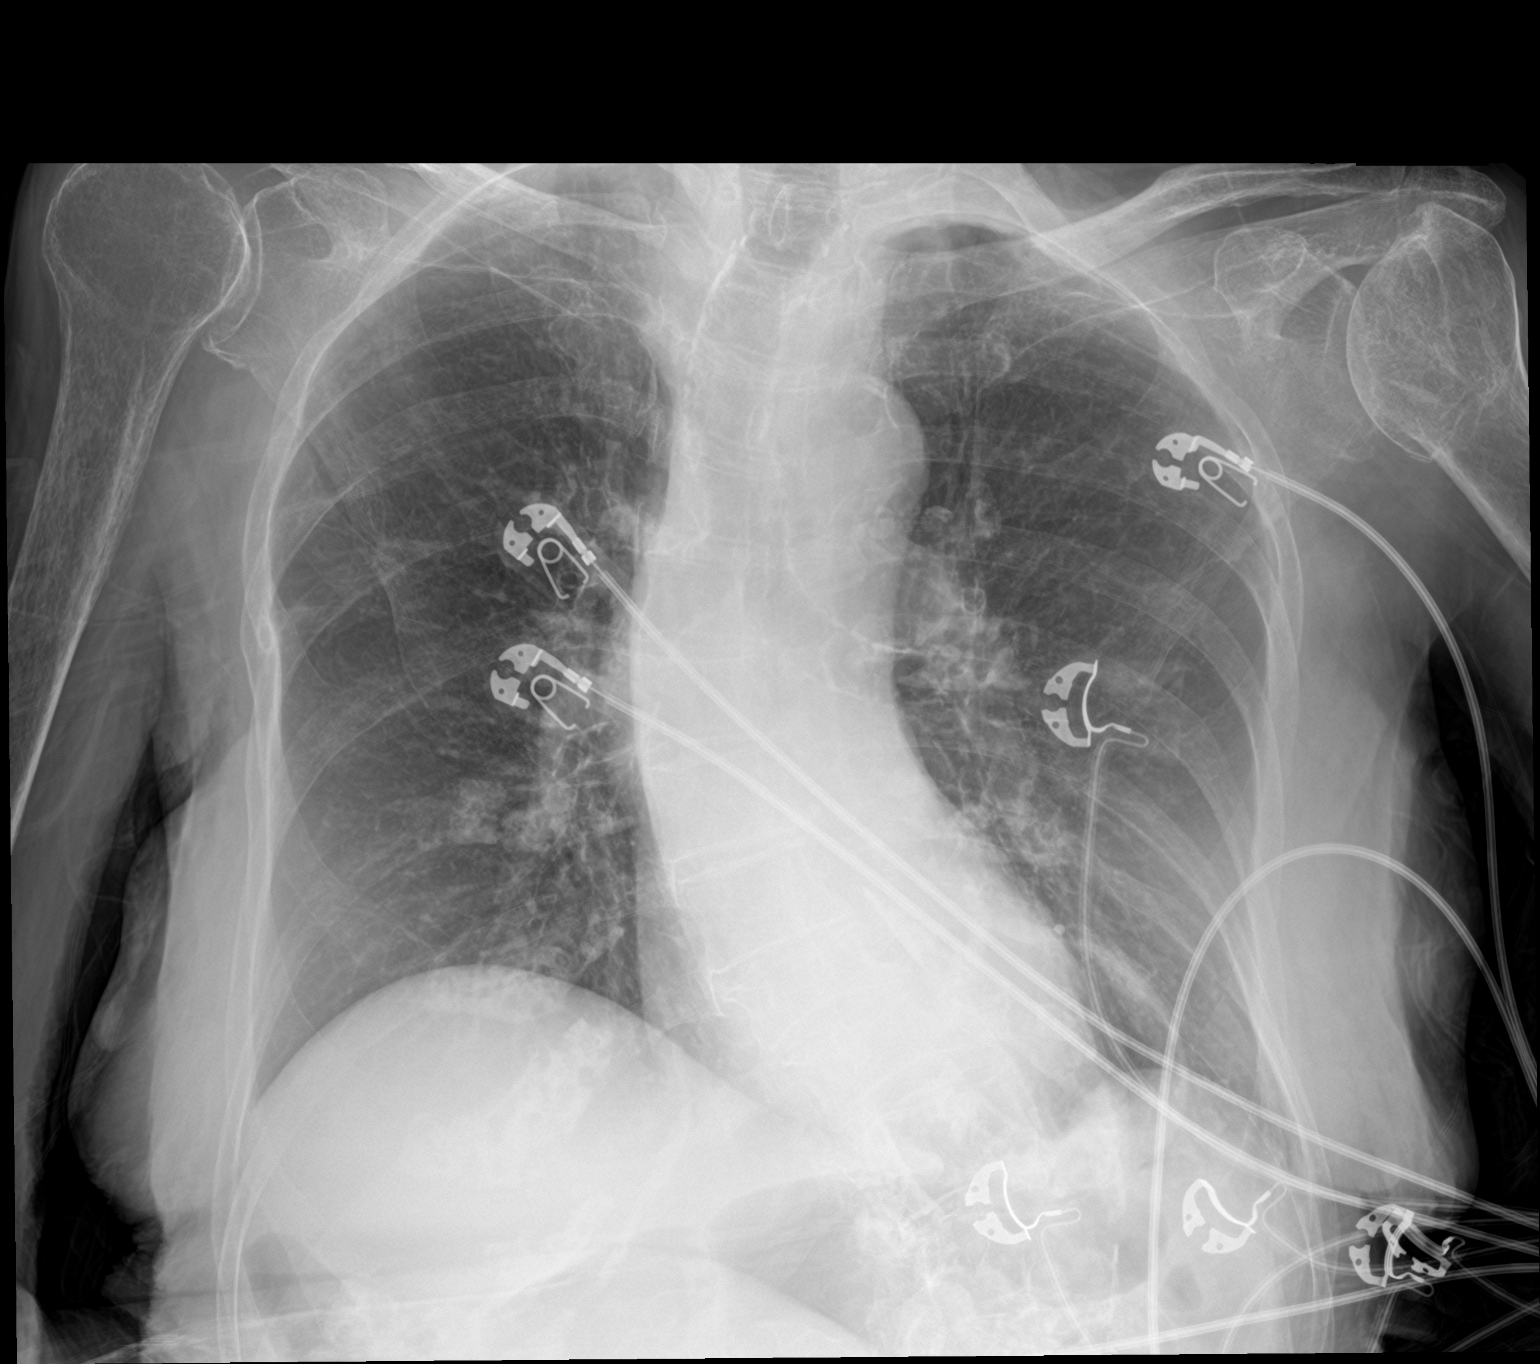

[1 of 1 positions shown; findings below may reference images not displayed]

FINDINGS: The cardiac silhouette, mediastinal and hilar contours are within
normal limits and stable. Mild tortuosity and calcification the
thoracic aorta.

No acute pulmonary findings. No pulmonary lesions or pleural
effusions.

The bony thorax is intact. Remote rib fractures and remote left
humeral head and neck fractures.
IMPRESSION: No acute cardiopulmonary findings.

## 2022-05-25 IMAGING — DX DG ELBOW COMPLETE 3+V*R*
3 series · 3 of 3 positions shown · non-contrast
Comparison: 11/07/2020

CLINICAL DATA: Right elbow pain.

EXAM:
RIGHT ELBOW - COMPLETE 3+ VIEW

[elbow ap (1 of 2)]
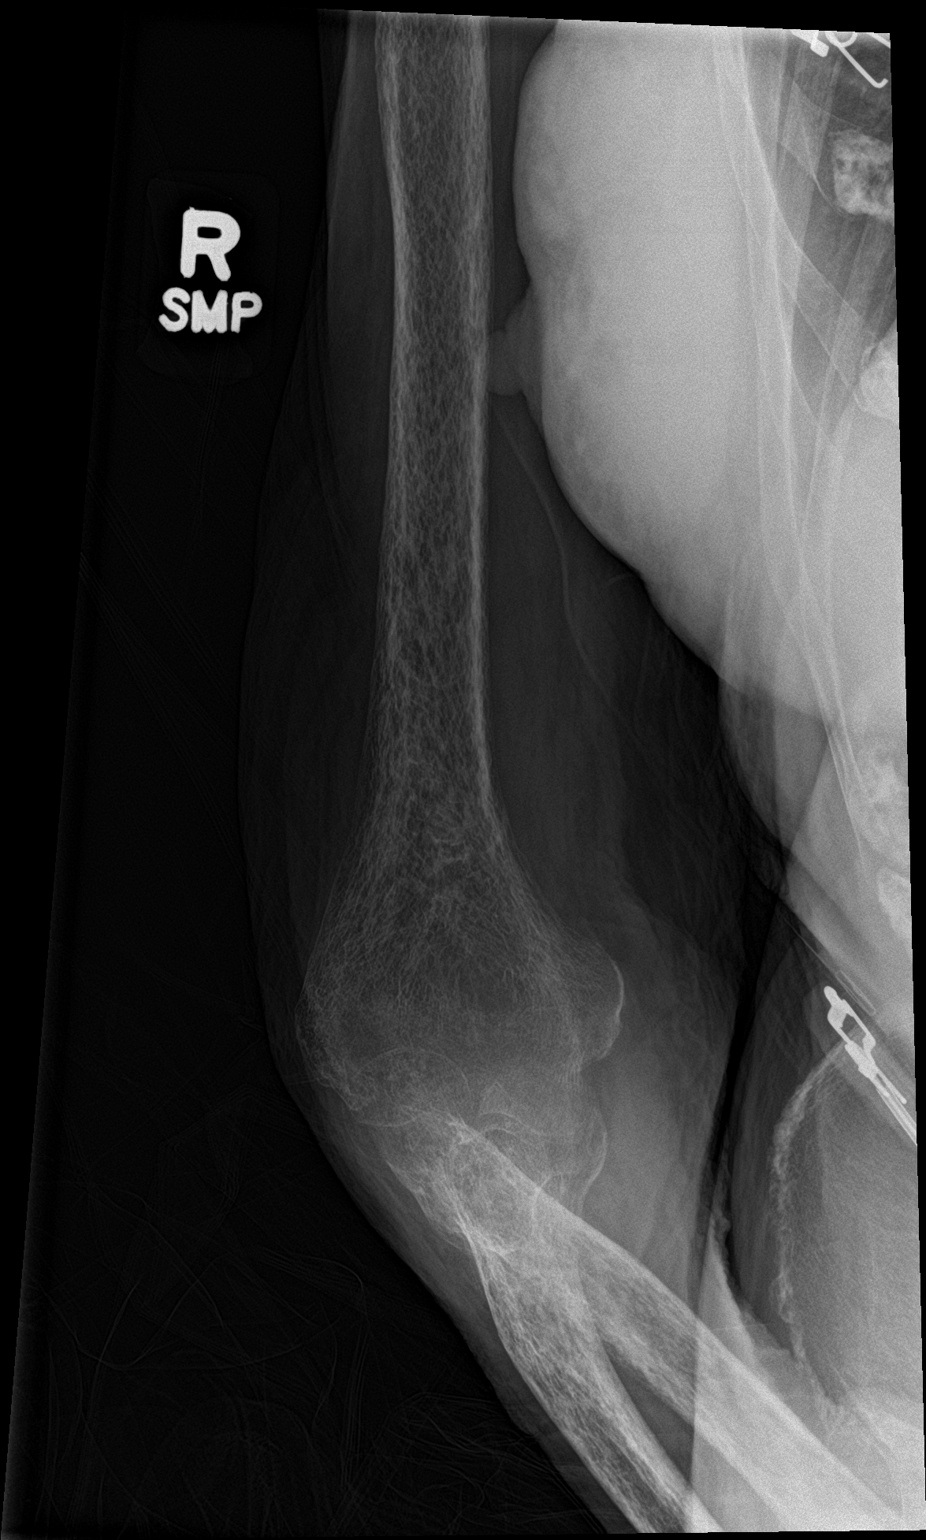

[elbow ap (2 of 2)]
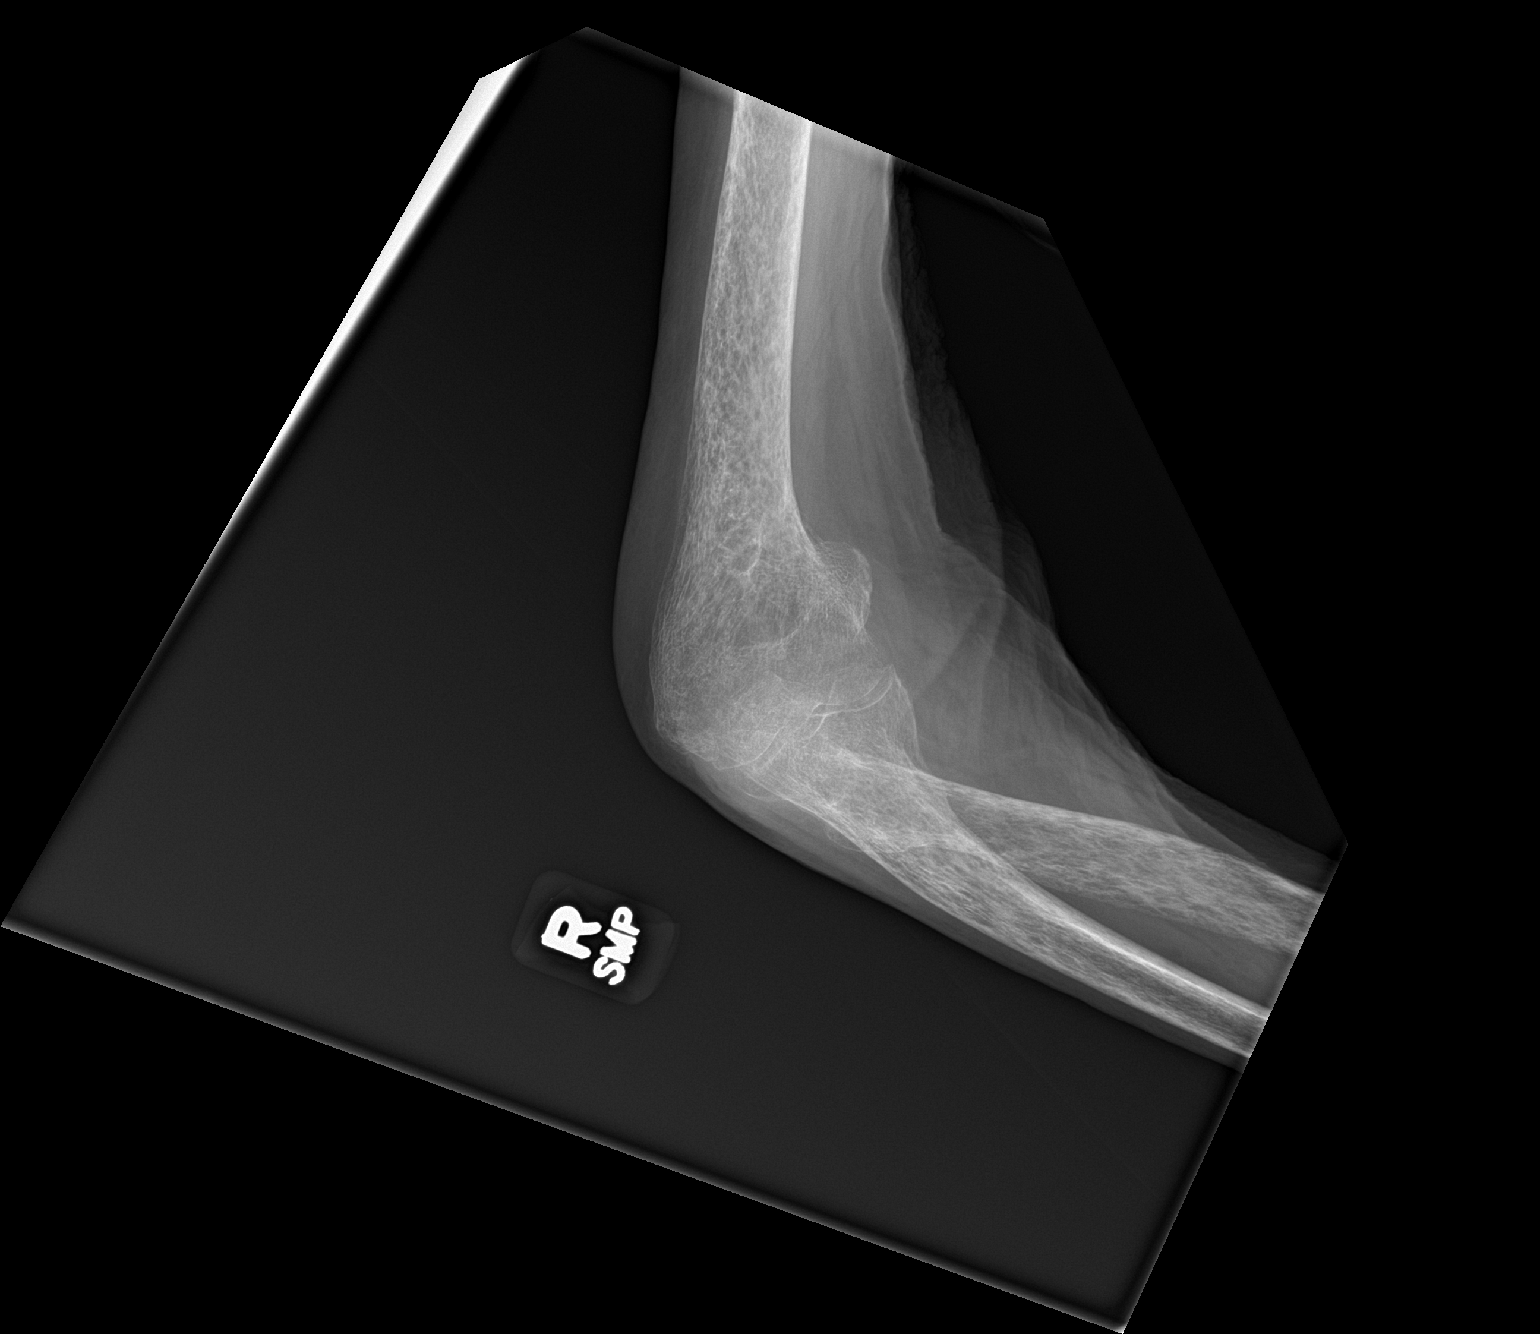

[elbow lat]
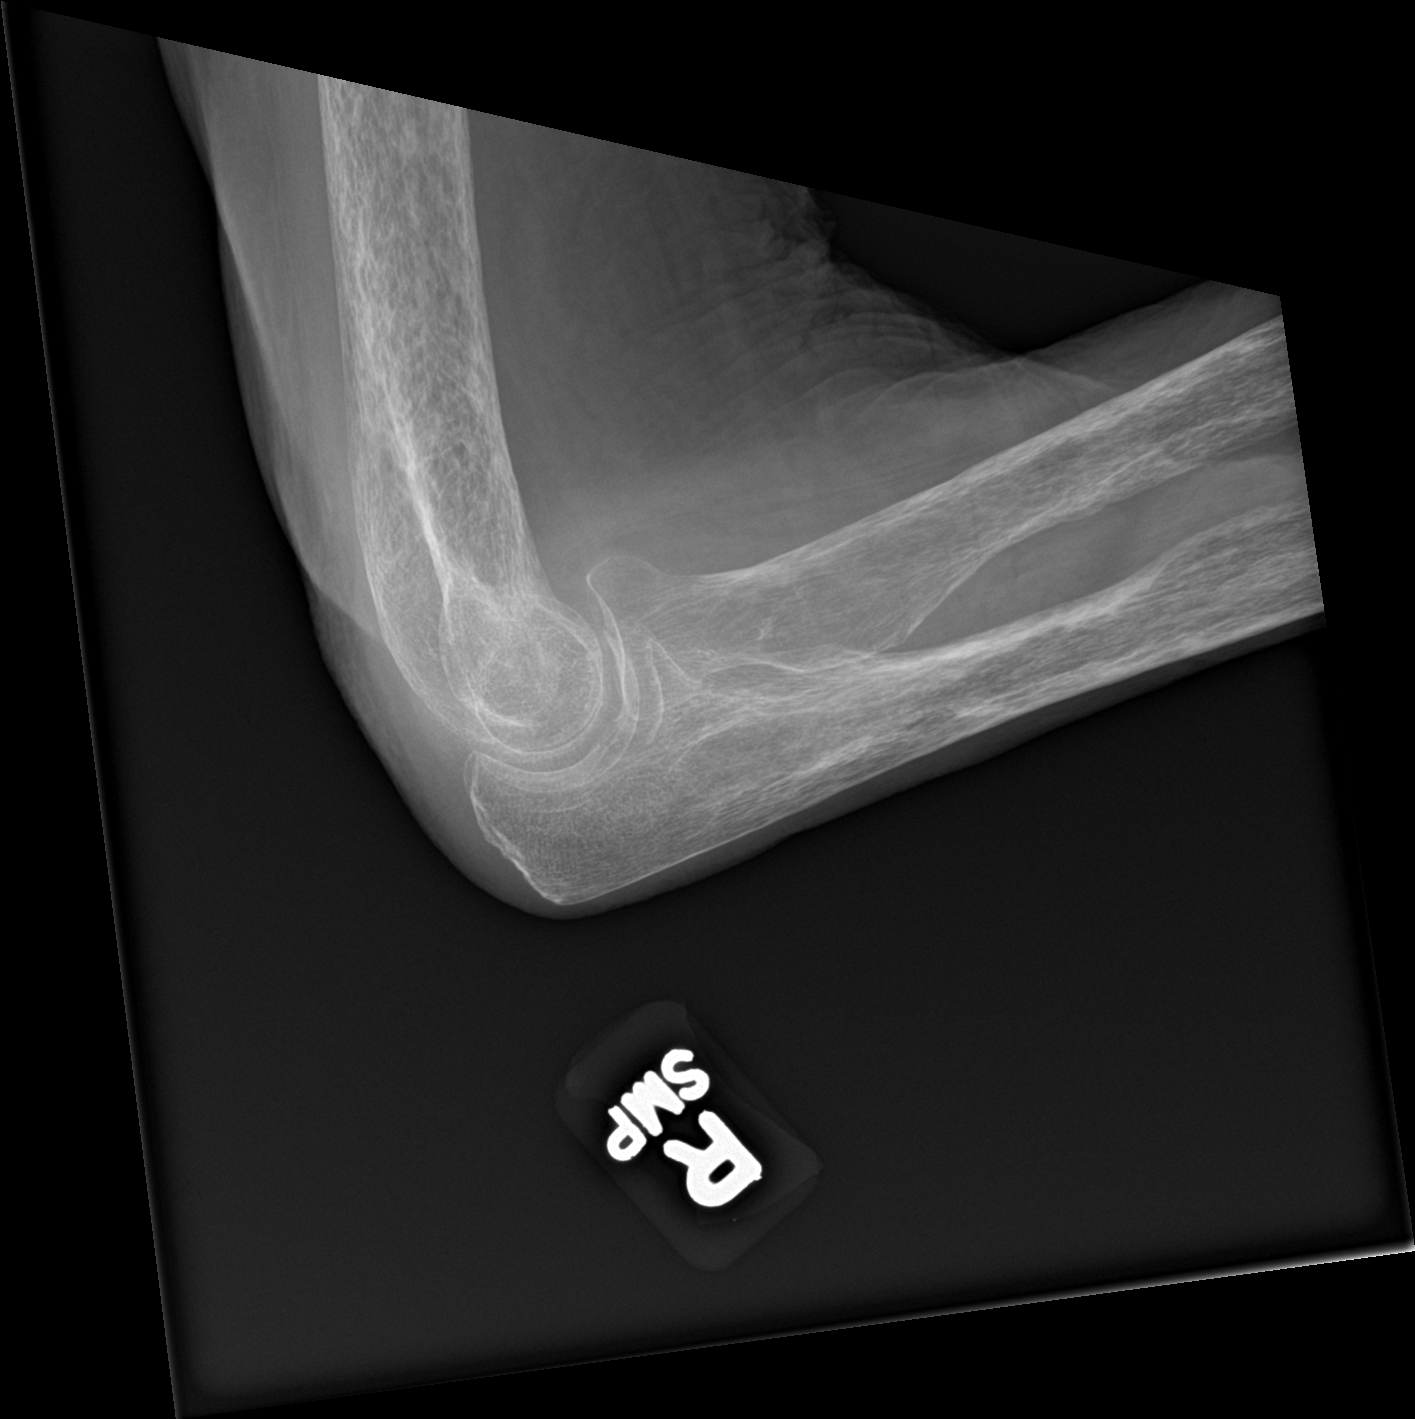

[3 of 3 positions shown; findings below may reference images not displayed]

FINDINGS: Limited exam due to positioning.

Advanced/aggressive pattern of osteoporosis.

No fracture or dislocation is noted on the lateral film. No obvious
joint effusion.
IMPRESSION: 1. Advanced/aggressive pattern of osteoporosis.
2. No acute bony findings.

## 2023-07-31 DEATH — deceased
